# Patient Record
Sex: Female | Born: 1945 | Race: White | Hispanic: No | Marital: Single | Smoking: Current every day smoker
Health system: Southern US, Community
[De-identification: ages and names within clinical notes are randomized; demographics above are authoritative.]

## PROBLEM LIST (undated history)

## (undated) DIAGNOSIS — H269 Unspecified cataract: Secondary | ICD-10-CM

## (undated) DIAGNOSIS — R918 Other nonspecific abnormal finding of lung field: Secondary | ICD-10-CM

## (undated) DIAGNOSIS — IMO0002 Reserved for concepts with insufficient information to code with codable children: Secondary | ICD-10-CM

## (undated) DIAGNOSIS — IMO0001 Reserved for inherently not codable concepts without codable children: Secondary | ICD-10-CM

## (undated) DIAGNOSIS — Z923 Personal history of irradiation: Secondary | ICD-10-CM

## (undated) DIAGNOSIS — D649 Anemia, unspecified: Secondary | ICD-10-CM

## (undated) DIAGNOSIS — E039 Hypothyroidism, unspecified: Secondary | ICD-10-CM

## (undated) DIAGNOSIS — I10 Essential (primary) hypertension: Secondary | ICD-10-CM

## (undated) DIAGNOSIS — Z720 Tobacco use: Secondary | ICD-10-CM

## (undated) DIAGNOSIS — I509 Heart failure, unspecified: Secondary | ICD-10-CM

## (undated) DIAGNOSIS — N289 Disorder of kidney and ureter, unspecified: Secondary | ICD-10-CM

## (undated) DIAGNOSIS — Q254 Congenital malformation of aorta unspecified: Secondary | ICD-10-CM

## (undated) DIAGNOSIS — R011 Cardiac murmur, unspecified: Secondary | ICD-10-CM

## (undated) DIAGNOSIS — E876 Hypokalemia: Secondary | ICD-10-CM

## (undated) DIAGNOSIS — C50919 Malignant neoplasm of unspecified site of unspecified female breast: Secondary | ICD-10-CM

## (undated) DIAGNOSIS — J449 Chronic obstructive pulmonary disease, unspecified: Secondary | ICD-10-CM

## (undated) DIAGNOSIS — C50412 Malignant neoplasm of upper-outer quadrant of left female breast: Secondary | ICD-10-CM

## (undated) DIAGNOSIS — E785 Hyperlipidemia, unspecified: Secondary | ICD-10-CM

## (undated) DIAGNOSIS — I701 Atherosclerosis of renal artery: Secondary | ICD-10-CM

## (undated) DIAGNOSIS — Z8639 Personal history of other endocrine, nutritional and metabolic disease: Secondary | ICD-10-CM

## (undated) DIAGNOSIS — Z972 Presence of dental prosthetic device (complete) (partial): Secondary | ICD-10-CM

## (undated) HISTORY — DX: Essential (primary) hypertension: I10

## (undated) HISTORY — PX: APPENDECTOMY: SHX54

## (undated) HISTORY — DX: Anemia, unspecified: D64.9

## (undated) HISTORY — DX: Reserved for inherently not codable concepts without codable children: IMO0001

## (undated) HISTORY — DX: Congenital malformation of aorta unspecified: Q25.40

## (undated) HISTORY — PX: TONSILLECTOMY: SUR1361

## (undated) HISTORY — DX: Hyperlipidemia, unspecified: E78.5

## (undated) HISTORY — DX: Malignant neoplasm of upper-outer quadrant of left female breast: C50.412

## (undated) HISTORY — PX: ARCH AORTOGRAM: SHX5726

## (undated) HISTORY — DX: Reserved for concepts with insufficient information to code with codable children: IMO0002

## (undated) HISTORY — PX: ABDOMINAL HYSTERECTOMY: SHX81

## (undated) NOTE — *Deleted (*Deleted)
Patient Care Team: Georgianne Fick, MD as PCP - General (Internal Medicine) Runell Gess, MD as Attending Physician (Cardiology) Rachael Fee, MD (Gastroenterology) Richarda Overlie, MD as Consulting Physician (Obstetrics and Gynecology) Salomon Fick, NP as Nurse Practitioner (Hematology and Oncology)  DIAGNOSIS: No diagnosis found.  SUMMARY OF ONCOLOGIC HISTORY: Oncology History  Breast cancer of upper-outer quadrant of left female breast (HCC)  08/30/2015 Imaging   DEXA scan: Normal    09/07/2015 Initial Diagnosis   Screening detected left breast mass at 1:00 position 4 x 3 x 4 mm biopsy grade 1 invasive ductal carcinoma ER 100% PR 90% positive HER-2 negative ratio 1.18 Ki-67 10%, T1 aN0 stage IA clinical stage   09/22/2015 Surgery   Left lumpectomy Derrell Lolling): IDC grade 1, 1.1 minutes, ADH, LCIS, margins negative, 0/3 lymph nodes, ER 100%, PR 90%, HER-2 negative, Ki-67 10%, T1 cN0 stage IA, Oncotype DX score 14, 9% ROR   10/30/2015 - 11/28/2015 Radiation Therapy   Adjuvant XRT ([facility]). Left breast: 42.72 Gy in 16 treatments.  Left breast "boost": 10 Gy in 5 treatments.  Total: 52.72 Gy in 21 treatments    11/2015 -  Anti-estrogen oral therapy   Tamoxifen 20mg  daily. Planned duration of treatment: 5 years ([place])   Squamous cell lung cancer, left (HCC)  09/10/2018 Initial Diagnosis   Squamous cell lung cancer, left (HCC)   09/10/2018 Cancer Staging   Staging form: Lung, AJCC 8th Edition - Clinical stage from 09/10/2018: cT1, cN0, cM0 - Signed by Serena Croissant, MD on 09/10/2018   10/27/2018 - 11/02/2018 Radiation Therapy   Stereotactic radiosurgery   Adenocarcinoma, lung, right (HCC)  09/02/2018 Initial Diagnosis   Lung nodule evaluation of her former smoker with shortness of breath during hospitalization, PET CT, right upper lobe nodule 1.2 x 1.1 cm SUV 7.6 biopsy revealed adenocarcinoma positive for TTF-1 and Napsin A, left upper lobe nodule 2.5 x 2.1 cm SUV  9.8, no lymph node hypermetabolism, 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 and TTF-1 is negative   09/10/2018 Cancer Staging   Staging form: Lung, AJCC 8th Edition - Clinical stage from 09/10/2018: cT1, cN0, cM0 - Signed by Serena Croissant, MD on 09/10/2018   10/27/2018 - 11/02/2018 Radiation Therapy   Stereotactic radiosurgery     CHIEF COMPLIANT: Follow-up of breast and lung cancers  INTERVAL HISTORY: Kendra Watts is a 14 y.o. with above-mentioned history of breast cancer currently on tamoxifen, and bilateral lung cancers. Mammogram on 10/28/19 showed no evidence of malignancy bilaterally. She presents to the clinic today for follow-up.   ALLERGIES:  has No Known Allergies.  MEDICATIONS:  Current Outpatient Medications  Medication Sig Dispense Refill  . acetaminophen (TYLENOL) 325 MG tablet Take 325 mg by mouth every 6 (six) hours as needed for mild pain or moderate pain.    Marland Kitchen albuterol (VENTOLIN HFA) 108 (90 Base) MCG/ACT inhaler Inhale 2 puffs into the lungs every 6 (six) hours as needed for wheezing or shortness of breath. 8 g 3  . aspirin 81 MG tablet Take 1 tablet (81 mg total) by mouth daily.    . Calcium-Magnesium-Vitamin D (CALCIUM 1200+D3 PO) Take 1 tablet by mouth daily.    Marland Kitchen doxazosin (CARDURA) 8 MG tablet Take 8 mg by mouth daily.     . furosemide (LASIX) 20 MG tablet Take 1 tablet (20 mg total) by mouth daily. 30 tablet 0  . ibandronate (BONIVA) 150 MG tablet Take 150 mg by mouth every  30 (thirty) days.    Marland Kitchen levothyroxine (SYNTHROID) 50 MCG tablet Take 50 mcg by mouth daily.    . metoprolol tartrate (LOPRESSOR) 25 MG tablet Take 25 mg by mouth daily.    . Multiple Vitamins-Minerals (MULTIVITAMIN WITH MINERALS) tablet Take 1 tablet by mouth daily.     . nicotine (NICODERM CQ - DOSED IN MG/24 HOURS) 14 mg/24hr patch Place 1 patch (14 mg total) onto the skin daily. (Patient not taking: Reported on 04/08/2020) 28 patch 0  . pantoprazole (PROTONIX) 40 MG  tablet Take 1 tablet (40 mg total) by mouth daily. 30 tablet 0  . rosuvastatin (CRESTOR) 10 MG tablet Take 10 mg by mouth daily.     . tamoxifen (NOLVADEX) 20 MG tablet TAKE 1 TABLET BY MOUTH EVERY DAY 90 tablet 0  . Tiotropium Bromide-Olodaterol (STIOLTO RESPIMAT) 2.5-2.5 MCG/ACT AERS Inhale 2 puffs into the lungs daily. 4 g 12   No current facility-administered medications for this visit.    PHYSICAL EXAMINATION: ECOG PERFORMANCE STATUS: {CHL ONC ECOG [phone]}  There were no vitals filed for this visit. There were no vitals filed for this visit.  BREAST:*** No palpable masses or nodules in either right or left breasts. No palpable axillary supraclavicular or infraclavicular adenopathy no breast tenderness or nipple discharge. (exam performed in the presence of a chaperone)  LABORATORY DATA:  I have reviewed the data as listed CMP Latest Ref Rng & Units 04/12/2020 04/08/2020 03/17/2020  Glucose 70 - 99 mg/dL 409(W) 98 95  BUN 8 - 23 mg/dL 9 13 8   Creatinine 0.44 - 1.00 mg/dL 1.19 1.47 8.29  Sodium 135 - 145 mmol/L 135 137 132(L)  Potassium 3.5 - 5.1 mmol/L 3.7 3.8 4.3  Chloride 98 - 111 mmol/L 100 100 101  CO2 22 - 32 mmol/L 28 26 26   Calcium 8.9 - 10.3 mg/dL 8.3(L) 9.2 8.7(L)  Total Protein 6.5 - 8.1 g/dL - - -  Total Bilirubin 0.3 - 1.2 mg/dL - - -  Alkaline Phos 38 - 126 U/L - - -  AST 15 - 41 U/L - - -  ALT 0 - 44 U/L - - -    Lab Results  Component Value Date   WBC 7.8 04/12/2020   HGB 8.0 (L) 04/12/2020   HCT 26.0 (L) 04/12/2020   MCV 90.9 04/12/2020   PLT 198 04/12/2020   NEUTROABS 10.0 (H) 04/08/2020    ASSESSMENT & PLAN:  No problem-specific Assessment & Plan notes found for this encounter.    No orders of the defined types were placed in this encounter.  The patient has a good understanding of the overall plan. she agrees with it. she will call with any problems that may develop before the next visit here.  Total time spent: *** mins including face  to face time and time spent for planning, charting and coordination of care  Serena Croissant, MD 05/14/2020  I, Kirt Boys Dorshimer, am acting as scribe for Dr. Serena Croissant.  {insert scribe attestation}

---

## 2000-05-28 ENCOUNTER — Ambulatory Visit (HOSPITAL_COMMUNITY): Admission: RE | Admit: 2000-05-28 | Discharge: 2000-05-28 | Payer: Self-pay | Admitting: *Deleted

## 2000-05-28 ENCOUNTER — Encounter (INDEPENDENT_AMBULATORY_CARE_PROVIDER_SITE_OTHER): Payer: Self-pay | Admitting: Specialist

## 2002-06-22 ENCOUNTER — Other Ambulatory Visit: Admission: RE | Admit: 2002-06-22 | Discharge: 2002-06-22 | Payer: Self-pay | Admitting: Obstetrics and Gynecology

## 2003-09-28 ENCOUNTER — Other Ambulatory Visit: Admission: RE | Admit: 2003-09-28 | Discharge: 2003-09-28 | Payer: Self-pay | Admitting: Obstetrics and Gynecology

## 2004-10-26 ENCOUNTER — Other Ambulatory Visit: Admission: RE | Admit: 2004-10-26 | Discharge: 2004-10-26 | Payer: Self-pay | Admitting: Obstetrics and Gynecology

## 2005-05-27 ENCOUNTER — Encounter: Admission: RE | Admit: 2005-05-27 | Discharge: 2005-05-27 | Payer: Self-pay | Admitting: Internal Medicine

## 2006-08-13 ENCOUNTER — Encounter: Admission: RE | Admit: 2006-08-13 | Discharge: 2006-08-13 | Payer: Self-pay | Admitting: Internal Medicine

## 2007-10-09 ENCOUNTER — Encounter: Admission: RE | Admit: 2007-10-09 | Discharge: 2007-10-09 | Payer: Self-pay | Admitting: Orthopedic Surgery

## 2010-06-04 ENCOUNTER — Encounter: Admission: RE | Admit: 2010-06-04 | Discharge: 2010-06-04 | Payer: Self-pay | Admitting: Cardiovascular Disease

## 2010-06-08 ENCOUNTER — Ambulatory Visit (HOSPITAL_COMMUNITY): Admission: RE | Admit: 2010-06-08 | Discharge: 2010-06-09 | Payer: Self-pay | Admitting: Cardiovascular Disease

## 2010-06-08 HISTORY — PX: RENAL ANGIOGRAM: SHX6061

## 2010-10-02 LAB — BASIC METABOLIC PANEL
BUN: 18 mg/dL (ref 6–23)
CO2: 25 mEq/L (ref 19–32)
Calcium: 9.1 mg/dL (ref 8.4–10.5)
Chloride: 106 mEq/L (ref 96–112)
Creatinine, Ser: 1.13 mg/dL (ref 0.4–1.2)
GFR calc Af Amer: 59 mL/min — ABNORMAL LOW (ref >60)
GFR calc non Af Amer: 48 mL/min — ABNORMAL LOW (ref >60)
Glucose, Bld: 98 mg/dL (ref 70–99)
Potassium: 4.1 mEq/L (ref 3.5–5.1)
Sodium: 139 mEq/L (ref 135–145)

## 2010-10-02 LAB — CBC
HCT: 37.4 % (ref 36.0–46.0)
Hemoglobin: 13.1 g/dL (ref 12.0–15.0)
MCH: 31.3 pg (ref 26.0–34.0)
MCHC: 35 g/dL (ref 30.0–36.0)
MCV: 89.3 fL (ref 78.0–100.0)
Platelets: 288 10*3/uL (ref 150–400)
RBC: 4.19 MIL/uL (ref 3.87–5.11)
RDW: 15.8 % — ABNORMAL HIGH (ref 11.5–15.5)
WBC: 10.2 10*3/uL (ref 4.0–10.5)

## 2010-10-17 ENCOUNTER — Encounter (INDEPENDENT_AMBULATORY_CARE_PROVIDER_SITE_OTHER): Payer: Self-pay | Admitting: *Deleted

## 2010-11-21 ENCOUNTER — Ambulatory Visit (INDEPENDENT_AMBULATORY_CARE_PROVIDER_SITE_OTHER): Payer: 59 | Admitting: Gastroenterology

## 2010-11-21 ENCOUNTER — Encounter: Payer: Self-pay | Admitting: Gastroenterology

## 2010-11-21 VITALS — BP 136/64 | HR 68 | Ht 60.0 in | Wt 107.0 lb

## 2010-11-21 DIAGNOSIS — Z1211 Encounter for screening for malignant neoplasm of colon: Secondary | ICD-10-CM

## 2010-11-21 MED ORDER — PEG-KCL-NACL-NASULF-NA ASC-C 100 G PO SOLR: 1.0000 | ORAL | Status: DC

## 2010-11-21 NOTE — Progress Notes (Signed)
HPI: This is a  very pleasant 65 year old woman. She had any colon polyp removed in 2001 by a different gastroenterologist. The biopsies from this showed that it was not an adenomatous polyp.  She is on plavix, prescribed by Dr. Jodi Geralds after renal artery stent was placed this past November.  She has no overt GI symptoms, no bleeding, no constipation or diarrhea. Her weight has been stable.    Review of systems: Pertinent positive and negative review of systems were noted in the above HPI section.  All other review of systems was otherwise negative.   Past Medical History, Past Surgical History, Family History, Social History, Current Medications, Allergies were all reviewed with the patient via [facility] electronic medical record system.   Physical Exam: BP 136/64  Pulse 68  Ht 5' (1.524 m)  Wt 107 lb (48.535 kg)  BMI 20.90 kg/m2  Constitutional: generally well-appearing Psychiatric: alert and oriented x3 Eyes: extraocular movements intact Mouth: oral pharynx moist, no lesions Neck: supple no lymphadenopathy Cardiovascular: heart regular rate and rhythm Lungs: clear to auscultation bilaterally Abdomen: soft, nontender, nondistended, no obvious ascites, no peritoneal signs, normal bowel sounds Extremities: no lower extremity edema bilaterally Skin: no lesions on visible extremities    Assessment and plan: 65 y.o. female with routine risk for colon cancer  She is at increased risk for bleeding complications from a colonoscopy while on Plavix. We will contact her cardiologist about holding the Plavix for 5 days prior to a colonoscopy. I see no reason for any further blood tests or imaging studies prior to then

## 2010-11-21 NOTE — Patient Instructions (Signed)
We will contact Dr. [name] from [facility] about holding your plavix for 5 days prior to a colonoscopy. You will be set up for a colonoscopy.

## 2010-11-22 ENCOUNTER — Encounter: Payer: Self-pay | Admitting: Gastroenterology

## 2010-11-27 ENCOUNTER — Telehealth: Payer: Self-pay

## 2010-11-27 NOTE — Telephone Encounter (Signed)
Pt okd to hold plavix for 5-7 days per Dr Allyson Sabal letter to be scanned to [facility]. Left message on machine to call back

## 2010-11-28 NOTE — Telephone Encounter (Signed)
Pt aware to hold Plavix 5 days before procedure

## 2010-12-04 NOTE — Procedures (Signed)
Kendra Watts, Kendra Watts               ACCOUNT NO.:  [id number]   MEDICAL RECORD NO.:  [id number]          PATIENT TYPE:  OIB   LOCATION:  6524                         FACILITY:  [facility]   PHYSICIAN:  Nanetta Batty, M.D.   DATE OF BIRTH:  26-Mar-1946   DATE OF PROCEDURE:  06/08/2010  DATE OF DISCHARGE:                    PERIPHERAL VASCULAR INVASIVE PROCEDURE    Arch aortogram, selective right-left subclavian artery angiogram,  selective right-left renal artery angiogram, abdominal aortogram with  bifemoral runoff, and PTA stent of left renal artery.   [name] is a 65 year old divorced Caucasian female with no children  who works at [employer].  She is referred by Dr.  Nicholos Johns for cardiovascular evaluation to assess for hypertensive  heart disease, hypertension, and abnormal renal Doppler study performed  in our office.  Risk factors include 40-pack-year history of tobacco  use, smoking 1 pack per day.  She has hypertension, hyperlipidemia, and  a strong family history of heart disease in mother that her first MI at  age 68 and died at 69.  She does get an occasional atypical chest pain  as well as shortness of breath, probably related to her tobacco abuse.  She denies significant claudication.  She has had Dopplers in our office  on April 05, 2010, revealing right renal aortic ratio of 2 with 11  cm pole-to-pole kidney size, left renal aortic ratio of 4.2 with a 9.3  cm pole-to-pole size.  Upper extremity Dopplers revealed high-grade  right subclavian artery stenosis with retrograde right vertebral flow.  She also had disease in her right SFA origin by duplex with preserved  ABIs.  She presents now for angiography, potential intervention for  treatment of renovascular hypertension.   PROCEDURE DESCRIPTION:  The patient was brought to the second floor of  [facility] in the postabsorptive state.  She was  premedicated with p.o. Valium.   Her right groin was prepped and shaved  in usual sterile fashion.  A 1% Xylocaine was used for local anesthesia.  A 5-French sheath was inserted in to the right femoral artery using  standard Seldinger technique.  A 5-French pigtail catheter was used for  arch angiography, abdominal aortography, bifemoral runoff using bolus  chase digital subtraction step table technique.  Visipaque dye was used  for the entirety of the case.  Selective angiography was performed of  the right and left subclavian artery using a long Judkins catheter.  Selective renal angiography was performed with the same catheter.  Visipaque dye was used for the entirety of the case.  Retrograde aortic  pressures were monitored during the case.   ANGIOGRAPHIC RESULTS:  1. Arch aortogram; the patient had arterial dystonia.      a.     80% to 90% right subclavian artery stenosis.  2. Abdominal aortogram.      a.     Renal arteries - 50% right renal artery stenosis with 20-mm       pullback gradient, 90% ostial left renal artery stenosis.      b.     Inferior abdominal aorta; fluoroscopically calcified with  mild-to-moderate atherosclerotic changes.  3. Left lower extremity;      a.     60% left external iliac artery stenosis.      b.     40-60% segmental mid left SFA with 2-vessel runoff.  4. Right lower extremity;      a.     60% right external iliac artery stenosis      b.     50% segmental mid right SFA with 2-vessel runoff.   PROCEDURE DESCRIPTION:  A 5-French sheath was exchanged over wire for 6-  French sheath.  The patient received 3000 units of heparin  intravenously.  Total contrast used was 260 mL to patient.  Using a 6-  [place] short JR-4 guide catheter along with 0.014 x 190 Spartacore wire  and 4.0 x 15 mm Aviator balloon predilatation was performed on the  ostium of the left renal artery.  This did appear to be mildly  calcified.  Following this, stenting was performed with a 5 x 12 Genesis  on  Aviator balloon stent premount at 10 atmospheres, resulting in  reduction of 90% ostial left renal artery stenosis to less than 10%  residual with excellent flow.  No dissection.  The patient tolerated the  procedure well.  Guidewire and catheter removed.  The sheath was sewn  securely in place.  The patient will be hydrated overnight and renal  function test will be checked in the morning.  She received aspirin and  Plavix, discharged home.  If she remains stable, we will get followup  renal Dopplers.  She will see me back in the office exactly after  followup.      Nanetta Batty, M.D.      JB/MEDQ  D:  06/08/2010  T:  06/09/2010  Job:  [id number]   cc:   [facility]  Georgianne Fick, M.D.  [facility] Second Floor   Electronically Signed by Nanetta Batty M.D. on 07/08/2010 02:58:47 PM

## 2010-12-07 NOTE — Procedures (Signed)
[facility]  Patient:    Kendra Watts, Kendra Watts                      MRN: [id number] Proc. Date: 05/28/00 Adm. Date:  [id number] Attending:  Sabino Gasser                           Procedure Report  PROCEDURE:  Colonoscopy.  GASTROENTEROLOGIST:  Sabino Gasser, M.D.  INDICATIONS:  Rectal bleeding.  ANESTHESIA:  Demerol 60 mg, Versed 5 mg.  PROCEDURE IN DETAIL:  With the patient mildly sedated and in the left lateral decubitus position, the Olympus pediatric video colonoscope was inserted in the rectum and then passed under direct vision with pressure applied and patient rotated to various position. Eventually we reached the cecum which was  identified by ileocecal valve and appendiceal orifice, both of which were photographed.  From this point, the colonoscope was slowly withdrawn, taking circumferential views of the entire colonic mucosa, stopping only in the rectum where a small polyp was seen, photographed, and removed using hot biopsy forceps technique.  The rectum otherwise appeared normal on direct view and showed large internal hemorrhoids on retroflexed view.  The patients vital signs and pulse oximetry remained stable.  The patient tolerated the procedure well with no apparent complications.  FINDINGS:  Small polyp in rectal area.  PLAN:  Await biopsy report.  The patient will call me for results and follow up as needed. DD:  05/28/00 TD:  05/28/00 Job: [id number]

## 2010-12-11 ENCOUNTER — Encounter: Payer: Self-pay | Admitting: Gastroenterology

## 2010-12-11 ENCOUNTER — Ambulatory Visit (AMBULATORY_SURGERY_CENTER): Payer: 59 | Admitting: Gastroenterology

## 2010-12-11 VITALS — BP 145/71 | HR 78 | Temp 96.6°F | Resp 17 | Ht 60.0 in | Wt 107.0 lb

## 2010-12-11 DIAGNOSIS — Z1211 Encounter for screening for malignant neoplasm of colon: Secondary | ICD-10-CM

## 2010-12-11 DIAGNOSIS — D126 Benign neoplasm of colon, unspecified: Secondary | ICD-10-CM

## 2010-12-11 MED ORDER — SODIUM CHLORIDE 0.9 % IV SOLN: 500.0000 mL | INTRAVENOUS | Status: DC

## 2010-12-11 NOTE — Patient Instructions (Signed)
Please read the handouts given to you by your recovery room nurse.  You may resume your plavix today per Dr. Christella Hartigan.  You may also resume any routine medications today.  Please call us if you have any questions or concerns.  Thank-you.

## 2010-12-12 ENCOUNTER — Telehealth: Payer: Self-pay | Admitting: *Deleted

## 2010-12-12 NOTE — Telephone Encounter (Signed)

## 2011-01-14 NOTE — Letter (Signed)
Summary: New Patient letter  [facility]  [address] [place], [place] [address]   Phone: [phone]  Fax: [phone]       10/17/2010 MRN: [id number]  Kendra Watts [address] [place], [place]  [address]  [place]  Dear Kendra Watts,  Welcome to the Gastroenterology Division at [facility].    You are scheduled to see Dr.  Christella Hartigan on 11-21-10 at 2:30P.M. on the 3rd floor at [facility], [address]  We ask that you try to arrive at our office 15 minutes prior to your appointment time to allow for check-in.  We would like you to complete the enclosed self-administered evaluation form prior to your visit and bring it with you on the day of your appointment.  We will review it with you.  Also, please bring a complete list of all your medications or, if you prefer, bring the medication bottles and we will list them.  Please bring your insurance card so that we may make a copy of it.  If your insurance requires a referral to see a specialist, please bring your referral form from your primary care physician.  Co-payments are due at the time of your visit and may be paid by cash, check or credit card.     Your office visit will consist of a consult with your physician (includes a physical exam), any laboratory testing he/she may order, scheduling of any necessary diagnostic testing (e.g. x-ray, ultrasound, CT-scan), and scheduling of a procedure (e.g. Endoscopy, Colonoscopy) if required.  Please allow enough time on your schedule to allow for any/all of these possibilities.    If you cannot keep your appointment, please call [phone] to cancel or reschedule prior to your appointment date.  This allows [place] the opportunity to schedule an appointment for another patient in need of care.  If you do not cancel or reschedule by 5 p.m. the business day prior to your appointment date, you will be charged a $50.00 late cancellation/no-show fee.    Thank you for  choosing [facility] for your medical needs.  We appreciate the opportunity to care for you.  Please visit [place] at our website  to learn more about our practice.                     Sincerely,                                                             The Gastroenterology Division

## 2012-01-07 ENCOUNTER — Other Ambulatory Visit: Payer: 59

## 2012-01-07 ENCOUNTER — Encounter: Payer: Self-pay | Admitting: Gastroenterology

## 2012-01-07 ENCOUNTER — Ambulatory Visit (INDEPENDENT_AMBULATORY_CARE_PROVIDER_SITE_OTHER): Payer: 59 | Admitting: Gastroenterology

## 2012-01-07 VITALS — BP 136/58 | HR 88 | Ht 60.0 in | Wt 103.0 lb

## 2012-01-07 DIAGNOSIS — D649 Anemia, unspecified: Secondary | ICD-10-CM

## 2012-01-07 NOTE — Progress Notes (Signed)
Review of pertinent gastrointestinal problems: 1. routine risk for colon cancer. Colonoscopy 2001 was normal. Colonoscopy by Dr. Christella Hartigan 2012, may found a small hyperplastic polyp. Otherwise normal. Recall colonoscopy at 10 year interval.   HPI: This is a  very pleasant 66 year old woman whom I last saw about a year ago.  That was at the time of a colonoscopy for routine risk colon cancer.  She returns today after being told she had significant anemia by her primary care physician. Her hemoglobin was 8.7, her MCV 64.1. Iron level XVI, ferritin 5. He did Hemoccult cards on her in the office and they were negative. She has had no overt bleeding. Just had physical the end of may, found to be anemic. No overt bleeding from GI, vaginal or other.  PUt on iron recently 2 weeks and stools have been darker.    She tended to have loose stools before the iron.  She is not irish heritige, but she has noticed    Review of systems: Pertinent positive and negative review of systems were noted in the above HPI section. Complete review of systems was performed and was otherwise normal.    Past Medical History  Diagnosis Date  . Hypertension   . Hyperlipidemia   . Blockage of kidney vein   . Anemia     Past Surgical History  Procedure Date  . Arch aortogram   . Left subclavian artery angiogram   . Left renal artery angiogram   . Renal artery stent     left  . Appendectomy   . Partial hysterectomy     Current Outpatient Prescriptions  Medication Sig Dispense Refill  . amLODipine-benazepril (LOTREL) 5-20 MG per capsule Take 1 capsule by mouth daily.        Marland Kitchen aspirin 325 MG tablet Take 325 mg by mouth daily.        . clopidogrel (PLAVIX) 75 MG tablet Take 75 mg by mouth daily. PLEASE RESTART THIS PILL TOMORROW      . doxazosin (CARDURA) 8 MG tablet Take 8 mg by mouth every morning.        . Multiple Vitamins-Minerals (MULTIVITAMIN WITH MINERALS) tablet Take 1 tablet by mouth daily.        .  rosuvastatin (CRESTOR) 10 MG tablet Take 10 mg by mouth daily.         Current Facility-Administered Medications  Medication Dose Route Frequency Provider Last Rate Last Dose  . DISCONTD: 0.9 %  sodium chloride infusion  500 mL Intravenous Continuous Rachael Fee, MD        Allergies as of 01/07/2012  . (No Known Allergies)    Family History  Problem Relation Age of Onset  . Heart disease Maternal Grandmother   . Heart disease Mother     History   Social History  . Marital Status: Single    Spouse Name: N/A    Number of Children: N/A  . Years of Education: N/A   Occupational History  . HR administrator    Social History Main Topics  . Smoking status: Current Everyday Smoker -- 1.0 packs/day for 40 years    Types: Cigarettes  . Smokeless tobacco: Never Used  . Alcohol Use: No  . Drug Use: No  . Sexually Active: Not on file   Other Topics Concern  . Not on file   Social History Narrative  . No narrative on file       Physical Exam: BP 136/58  Pulse 88  Ht  5' (1.524 m)  Wt 103 lb (46.72 kg)  BMI 20.12 kg/m2 Constitutional: generally well-appearing Psychiatric: alert and oriented x3 Eyes: extraocular movements intact Mouth: oral pharynx moist, no lesions Neck: supple no lymphadenopathy Cardiovascular: heart regular rate and rhythm Lungs: clear to auscultation bilaterally Abdomen: soft, nontender, nondistended, no obvious ascites, no peritoneal signs, normal bowel sounds Extremities: no lower extremity edema bilaterally Skin: no lesions on visible extremities    Assessment and plan: 66 y.o. female with  significant iron deficiency anemia, was Hemoccult negative by guaiac in primary care office   She is going to complete 2 more sets of Hemoccult tests at home. I am also going to have her down to the lab to check for celiac sprue panel. Further endoscopic testing such as EGD or repeat colonoscopy will be decided on after the results of those  tests.

## 2012-01-07 NOTE — Patient Instructions (Addendum)
One of your biggest health concerns is your smoking.  This increases your risk for most cancers and serious cardiovascular diseases such as strokes, heart attacks.  You should try your best to stop.  If you need assistance, please contact your PCP or Smoking Cessation Class at  [facility] [phone]) or [facility] Quit-Line (1-800-QUIT-NOW). You will have labs checked today in the basement lab.  Please head down after you check out with the front desk  (celiac panel, stool cards for FOBT testing 2 sets).   Pending those results, you will need further endoscopy (egd and or repeat colonoscopy).

## 2012-01-08 LAB — CELIAC PANEL 10
Endomysial Screen: NEGATIVE
Gliadin IgA: 2.4 U/mL (ref <20)
Gliadin IgG: 2.9 U/mL (ref <20)
IgA: 136 mg/dL (ref 69–380)
Tissue Transglut Ab: 4 U/mL (ref <20)
Tissue Transglutaminase Ab, IgA: 2.1 U/mL (ref <20)

## 2012-01-24 ENCOUNTER — Other Ambulatory Visit: Payer: 59

## 2012-01-24 DIAGNOSIS — D649 Anemia, unspecified: Secondary | ICD-10-CM

## 2012-01-24 LAB — HEMOCCULT SLIDES (X 3 CARDS)
Fecal Occult Blood: NEGATIVE
Fecal Occult Blood: NEGATIVE
OCCULT 1: NEGATIVE
OCCULT 1: NEGATIVE
OCCULT 2: NEGATIVE
OCCULT 2: NEGATIVE
OCCULT 3: NEGATIVE
OCCULT 3: NEGATIVE
OCCULT 4: NEGATIVE
OCCULT 4: NEGATIVE
OCCULT 5: NEGATIVE
OCCULT 5: NEGATIVE

## 2012-02-03 ENCOUNTER — Encounter (HOSPITAL_COMMUNITY): Payer: Self-pay | Admitting: Respiratory Therapy

## 2012-02-13 ENCOUNTER — Other Ambulatory Visit: Payer: Self-pay | Admitting: Cardiovascular Disease

## 2012-02-18 ENCOUNTER — Ambulatory Visit (HOSPITAL_COMMUNITY)
Admission: RE | Admit: 2012-02-18 | Discharge: 2012-02-18 | Disposition: A | Discharge status: Home / Self Care | Payer: 59 | Source: Ambulatory Visit | Attending: Cardiovascular Disease | Admitting: Cardiovascular Disease

## 2012-02-18 ENCOUNTER — Encounter (HOSPITAL_COMMUNITY)
Admission: RE | Disposition: A | Discharge status: Home / Self Care | Payer: Self-pay | Source: Ambulatory Visit | Attending: Cardiovascular Disease

## 2012-02-18 DIAGNOSIS — I6529 Occlusion and stenosis of unspecified carotid artery: Secondary | ICD-10-CM | POA: Insufficient documentation

## 2012-02-18 DIAGNOSIS — Y831 Surgical operation with implant of artificial internal device as the cause of abnormal reaction of the patient, or of later complication, without mention of misadventure at the time of the procedure: Secondary | ICD-10-CM | POA: Insufficient documentation

## 2012-02-18 DIAGNOSIS — I708 Atherosclerosis of other arteries: Secondary | ICD-10-CM | POA: Insufficient documentation

## 2012-02-18 DIAGNOSIS — T82898A Other specified complication of vascular prosthetic devices, implants and grafts, initial encounter: Secondary | ICD-10-CM | POA: Insufficient documentation

## 2012-02-18 HISTORY — PX: CAROTID ANGIOGRAM: SHX5504

## 2012-02-18 HISTORY — PX: ABDOMINAL ANGIOGRAM: SHX5499

## 2012-02-18 SURGERY — CAROTID ANGIOGRAM
Anesthesia: LOCAL

## 2012-02-18 MED ORDER — HEPARIN (PORCINE) IN NACL 2-0.9 UNIT/ML-% IJ SOLN
INTRAMUSCULAR | Status: AC
  Filled 2012-02-18: qty 1000

## 2012-02-18 MED ORDER — LIDOCAINE HCL (PF) 1 % IJ SOLN
INTRAMUSCULAR | Status: AC
  Filled 2012-02-18: qty 30

## 2012-02-18 MED ORDER — SODIUM CHLORIDE 0.9 % IJ SOLN: 3.0000 mL | INTRAMUSCULAR | Status: DC | PRN

## 2012-02-18 MED ORDER — SODIUM CHLORIDE 0.9 % IV SOLN
INTRAVENOUS | Status: DC
  Administered 2012-02-18: 08:00:00 via INTRAVENOUS

## 2012-02-18 NOTE — Op Note (Signed)
Kendra Watts is a 66 y.o. female    [id number] LOCATION:  FACILITY: [facility]  PHYSICIAN: Nanetta Batty, M.D. 09/27/1945   DATE OF PROCEDURE:  02/18/2012  DATE OF DISCHARGE:  [facility] Angio    History obtained from chart review. Kendra Watts is a 66 year old thin appearing divorced Caucasian female with no children referred to me by Dr. Nicholos Johns from [facility] medical, who is retired from working at [employer] in [name]. She has a history of continued tobacco abuse one pack per day, hypertension, hyperlipidemia and a strong family history of heart disease with the mother that her first MI at age 83 and died at age 46. She had negative Myoview performed this month. 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 I stented her left renal artery at that time which was calcified. We've been following her carotid Dopplers which showed progression of disease in the right internal carotid artery. She remaines neurologically symptomatic. She presents now for angiography to define the extent of her disease in order to decide appropriateness of therapy.   PROCEDURE DESCRIPTION:    The patient was brought to the second floor  [place] [facility] lab in the postabsorptive state. She was not  premedicated . Her right groin was prepped and shaved in usual sterile fashion. Xylocaine 1% was used  for local anesthesia. A 5 French sheath was inserted into the right common femoral  artery using standard Seldinger technique. A 5 [place] JB 1 catheter along with a short 5 [place] JR catheter and pigtail catheter were used for selective right and left carotid angiography and (intra-and extracranial views), abdominal aortography and selective left renal  artery angiography. Visipaque dye was used for the entirety of the case. Retrograde aortic pressure was monitored in the case.   HEMODYNAMICS:    AO SYSTOLIC/AO DIASTOLIC: 151/59    ANGIOGRAPHIC RESULTS:   1: Right carotid-80% calcified right internal carotid artery stenosis that was a hypodense angiographically with an absent external carotid artery.  2: Left carotid-20% left internal carotid artery stenosis.  3: abdominal aortogram-angiographic calcification of the abdominal aorta at the level of the renal arteries. Selective left renal angiography revealed 60% "in-stent" restenosis within the previous replacement. He was a 50% left common iliac artery stenosis as well.  IMPRESSION:Kendra Watts has a 75-80% calcified hypodense proximal right internal carotid artery stenosis with velocities that are consistent with her anatomy. She was neurologically asymptomatic and low risk for endarterectomy. I have reviewed her anatomy with Dr. Durene Cal , from [facility], who feels that she is an appropriate endarterectomy candidate.  The sheath was removed and pressure was held on the groin to achieve hemostasis. The patient left the lab in stable condition. She'll be hydrated for next 4 hours or remain recumbent at which time she'll be discharged home. It was her back in the office after that for further evaluation. Interpretation of intracranial cerebral entry and will be performed by neuro interventional radiology.  Runell Gess MD, [facility] 02/18/2012 9:55 AM

## 2012-02-18 NOTE — H&P (Signed)
  H & P will be scanned in.  Pt was reexamined and existing H & P reviewed. No changes found.  Runell Gess, MD [facility] 02/18/2012 9:09 AM

## 2012-02-20 NOTE — Consult Note (Signed)
NAMEKASIDY, GIANINO               ACCOUNT NO.:  [id number]  MEDICAL RECORD NO.:  [id number]  LOCATION:  [facility]                         FACILITY:  [facility]  PHYSICIAN:  Semiah Konczal K. Sallye Lunz, M.D.DATE OF BIRTH:  1946-05-14  DATE OF CONSULTATION: DATE OF DISCHARGE:  02/18/2012                                CONSULTATION   CLINICAL HISTORY:  The patient with symptoms of left carotid bruit.  EXAMINATION:  Intracranial interpretation of bilateral common carotid arteriograms.  FINDINGS:    The right common carotid artery arteriogram demonstrates the distal vertical cervical portion of the right internal carotid artery to be normal.  There is approximately 50-60% focal stenosis of the proximal cavernous segment of the right internal carotid artery secondary to a circumferential plaque.  The petrous segment is normal.  The supraclinoid segment also opacifies normally.  The right middle cerebral artery and the right anterior cerebral artery opacify normally into the capillary and the venous phases.  The left common carotid arteriogram demonstrates moderate tortuosity of the distal cervical segment of the left internal carotid artery.  The cervical petrous junction is normal.  There is a focal moderate stenosis of the petrous cavernous junction.  The distal cavernous and the supraclinoid segments are normal.  The left middle cerebral artery has a mild focal stenosis at its origin. It's branches are otherwise normally opacified into the capillary and venous phases.  The left anterior cerebral artery  Opacifies  into the A2 and distal distribution, with cross opacification via the anterior communicating artery of the right anterior cerebral artery distal to the A2 segment.  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  IMPRESSION: 1. Focal probably 50-60% stenosis of the right internal carotid     artery, proximal cavernous segment. 2.  Moderate stenosis of the petrous cavernous junction of the left     internal carotid artery. 3. Mild stenosis at the origin of the left middle cerebral artery.          ______________________________ Grandville Silos Corliss Skains, M.D.     SKD/MEDQ  D:  02/19/2012  T:  02/20/2012  Job:  [id number]

## 2012-02-21 ENCOUNTER — Telehealth: Payer: Self-pay | Admitting: Surgery

## 2012-02-21 NOTE — Telephone Encounter (Signed)
Message copied by Fredrich Birks on Fri Feb 21, 2012 11:53 AM ------      Message from: Melene Plan      Created: Fri Feb 21, 2012  9:15 AM                   ----- Message -----         From: Nada Libman, MD         Sent: 02/21/2012   7:22 AM           To: Melene Plan, RN            This patient needs a clinic appt with me.  She is a patient of Dr. Allyson Sabal.  He has already taken care of it

## 2012-02-21 NOTE — Telephone Encounter (Signed)
Patient notified of appointment and new pw sent, dpm

## 2012-02-25 ENCOUNTER — Ambulatory Visit (INDEPENDENT_AMBULATORY_CARE_PROVIDER_SITE_OTHER): Payer: 59 | Admitting: Gastroenterology

## 2012-02-25 ENCOUNTER — Encounter: Payer: Self-pay | Admitting: Gastroenterology

## 2012-02-25 ENCOUNTER — Ambulatory Visit (INDEPENDENT_AMBULATORY_CARE_PROVIDER_SITE_OTHER)
Admission: RE | Admit: 2012-02-25 | Discharge: 2012-02-25 | Disposition: A | Discharge status: Home / Self Care | Payer: 59 | Source: Ambulatory Visit | Attending: Gastroenterology | Admitting: Gastroenterology

## 2012-02-25 ENCOUNTER — Other Ambulatory Visit (INDEPENDENT_AMBULATORY_CARE_PROVIDER_SITE_OTHER): Payer: 59

## 2012-02-25 VITALS — BP 116/56 | HR 84 | Ht 59.5 in | Wt 98.4 lb

## 2012-02-25 DIAGNOSIS — R634 Abnormal weight loss: Secondary | ICD-10-CM

## 2012-02-25 DIAGNOSIS — D649 Anemia, unspecified: Secondary | ICD-10-CM

## 2012-02-25 LAB — CREATININE, SERUM: Creatinine, Ser: 1.1 mg/dL (ref 0.4–1.2)

## 2012-02-25 LAB — BUN: BUN: 14 mg/dL (ref 6–23)

## 2012-02-25 NOTE — Patient Instructions (Addendum)
One of your biggest health concerns is your smoking.  This increases your risk for most cancers and serious cardiovascular diseases such as strokes, heart attacks.  You should try your best to stop.  If you need assistance, please contact your PCP or Smoking Cessation Class at [facility] [phone]) or [facility] Quit-Line (1-800-QUIT-NOW). You will be set up for a CT scan of abdomen and pelvis with IV and oral contrast for anemia, weight loss. Chest xray (PA and lateral) for anemia and weight loss, smoker.  Head down to the basement at our x ray department for chest xray. Please have labs today as well as the chest xray at our basement level.   You have been scheduled for a CT scan of the abdomen and pelvis at [facility] ([address]---this is in the same building as Architectural technologist).   You are scheduled on 02/27/12 at 9 am. You should arrive 15 minutes prior to your appointment time for registration. Please follow the written instructions below on the day of your exam:  WARNING: IF YOU ARE ALLERGIC TO IODINE/X-RAY DYE, PLEASE NOTIFY RADIOLOGY IMMEDIATELY AT [phone]! YOU WILL BE GIVEN A 13 HOUR PREMEDICATION PREP.  1) Do not eat or drink anything after 5 am (4 hours prior to your test) 2) You have been given 2 bottles of oral contrast to drink. The solution may taste  better if refrigerated, but do NOT add ice or any other liquid to this solution. Shake well before drinking.    Drink 1 bottle of contrast @ 7 am (2 hours prior to your exam)  Drink 1 bottle of contrast @ 8 am  (1 hour prior to your exam)  You may take any medications as prescribed with a small amount of water except for the following: Metformin, Glucophage, Glucovance, Avandamet, Riomet, Fortamet, Actoplus Met, Janumet, Glumetza or Metaglip. The above medications must be held the day of the exam AND 48 hours after the exam.  The purpose of you drinking the oral contrast is to aid in the visualization  of your intestinal tract. The contrast solution may cause some diarrhea. Before your exam is started, you will be given a small amount of fluid to drink. Depending on your individual set of symptoms, you may also receive an intravenous injection of x-ray contrast/dye. Plan on being at [facility] for 30 minutes or long, depending on the type of exam you are having performed.  If you have any questions regarding your exam or if you need to reschedule, you may call the CT department at [phone] between the hours of 8:00 am and 5:00 pm, Monday-Friday.  ________________________________________________________________________

## 2012-02-25 NOTE — Progress Notes (Signed)
Review of pertinent gastrointestinal problems:  1. routine risk for colon cancer. Colonoscopy 2001 was normal. Colonoscopy by Dr. Christella Hartigan 2012, may found a small hyperplastic polyp. Otherwise normal. Recall colonoscopy at 10 year interval. 2. IDA: June 2013 persistently Hemoccult negative, celiac sprue workup was negative by blood test. Her hemoglobin was 8.7, her MCV 64.1. Iron level 16, ferritin 5.  HPI: This is a  very pleasant 66 year old woman whom I last saw 1-2 months ago.  She thinks she's lost some weight, "may be from not working."  She feels maybe 5-6 pounds in past few months.  No anorexia.  No dyspepsia, no early satiety.  Has been on iron for over two months.  Getting labs rechecked this Friday.   Past Medical History  Diagnosis Date  . Hypertension   . Hyperlipidemia   . Blockage of kidney vein   . Anemia     Past Surgical History  Procedure Date  . Arch aortogram   . Left subclavian artery angiogram   . Left renal artery angiogram   . Renal artery stent     left  . Appendectomy   . Partial hysterectomy     Current Outpatient Prescriptions  Medication Sig Dispense Refill  . amLODipine-benazepril (LOTREL) 5-20 MG per capsule Take 1 capsule by mouth daily.        Marland Kitchen aspirin 325 MG tablet Take 325 mg by mouth daily.        . clopidogrel (PLAVIX) 75 MG tablet Take 75 mg by mouth daily.       Marland Kitchen doxazosin (CARDURA) 8 MG tablet Take 8 mg by mouth every morning.        . Fe Fum-FePoly-FA-Vit C-Vit B3 (INTEGRA F PO) Take 1 capsule by mouth daily.      . Multiple Vitamins-Minerals (MULTIVITAMIN WITH MINERALS) tablet Take 1 tablet by mouth daily.        . rosuvastatin (CRESTOR) 10 MG tablet Take 10 mg by mouth daily.          Allergies as of 02/25/2012  . (No Known Allergies)    Family History  Problem Relation Age of Onset  . Heart disease Maternal Grandmother   . Heart disease Mother     History   Social History  . Marital Status: Single    Spouse Name: N/A     Number of Children: N/A  . Years of Education: N/A   Occupational History  . HR administrator    Social History Main Topics  . Smoking status: Current Everyday Smoker -- 1.0 packs/day for 40 years    Types: Cigarettes  . Smokeless tobacco: Never Used  . Alcohol Use: No  . Drug Use: No  . Sexually Active: Not on file   Other Topics Concern  . Not on file   Social History Narrative  . No narrative on file      Physical Exam: BP 116/56  Pulse 84  Ht 4' 11.5" (1.511 m)  Wt 98 lb 6 oz (44.623 kg)  BMI 19.54 kg/m2 Constitutional: generally well-appearing Psychiatric: alert and oriented x3 Abdomen: soft, nontender, nondistended, no obvious ascites, no peritoneal signs, normal bowel sounds     Assessment and plan: 66 y.o. female with iron deficiency anemia, persistently Hemoccult negative  She has no GI symptoms, she is persistently Hemoccult negative, celiac sprue testing was normal, colonoscopy about a year ago found only hyperplastic polyps. I don't think her anemia is from her GI tract. She has however had significant iron deficiency  anemia and some weight loss. She is a chronic smoker. Going to image her for occult neoplasm using CT scan abdomen and pelvis as well as PA and lateral chest x-ray.

## 2012-02-27 ENCOUNTER — Ambulatory Visit (INDEPENDENT_AMBULATORY_CARE_PROVIDER_SITE_OTHER)
Admission: RE | Admit: 2012-02-27 | Discharge: 2012-02-27 | Disposition: A | Discharge status: Home / Self Care | Payer: 59 | Source: Ambulatory Visit | Attending: Gastroenterology | Admitting: Gastroenterology

## 2012-02-27 DIAGNOSIS — R634 Abnormal weight loss: Secondary | ICD-10-CM

## 2012-02-27 DIAGNOSIS — D649 Anemia, unspecified: Secondary | ICD-10-CM

## 2012-02-27 MED ORDER — IOHEXOL 300 MG/ML  SOLN
100.0000 mL | Freq: Once | INTRAMUSCULAR | Status: AC | PRN
  Administered 2012-02-27: 100 mL via INTRAVENOUS

## 2012-03-03 ENCOUNTER — Other Ambulatory Visit: Payer: Self-pay

## 2012-03-03 DIAGNOSIS — R932 Abnormal findings on diagnostic imaging of liver and biliary tract: Secondary | ICD-10-CM

## 2012-03-03 NOTE — Progress Notes (Signed)
[facility] MRI pancreas 03/11/12 8 am 745 am arrival NPO 4 hours. Pt has been notified

## 2012-03-06 ENCOUNTER — Encounter: Payer: Self-pay | Admitting: Surgery

## 2012-03-09 ENCOUNTER — Other Ambulatory Visit: Payer: Self-pay | Admitting: *Deleted

## 2012-03-09 ENCOUNTER — Ambulatory Visit (INDEPENDENT_AMBULATORY_CARE_PROVIDER_SITE_OTHER): Payer: 59 | Admitting: Surgery

## 2012-03-09 ENCOUNTER — Encounter: Payer: Self-pay | Admitting: Surgery

## 2012-03-09 ENCOUNTER — Encounter: Payer: Self-pay | Admitting: *Deleted

## 2012-03-09 VITALS — BP 152/64 | HR 88 | Resp 18 | Ht 60.0 in | Wt 99.3 lb

## 2012-03-09 DIAGNOSIS — I6529 Occlusion and stenosis of unspecified carotid artery: Secondary | ICD-10-CM

## 2012-03-09 NOTE — Progress Notes (Signed)
[facility] [place]   Patient name: Kendra Watts MRN: [id number] DOB: Aug 31, 1945 Sex: female   Referred by: Dr. Allyson Sabal  Reason for referral:  Chief Complaint  Patient presents with  . New Evaluation    referredby Dr. Allyson Sabal  . Carotid    HISTORY OF PRESENT ILLNESS: The patient comes in today for discussions regarding right carotid endarterectomy. 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 This is been followed with serial ultrasound to where it had progressed to greater than 70%. She recently underwent diagnostic angiography which revealed 80% right carotid stenosis. She was referred for carotid endarterectomy.  The patient is medically managed for hypertension and hypercholesterolemia. She has a strong family history of heart disease. Her mother first had a myocardial infarction at age 85 and died at age 50. She has recently had a negative Myoview on 02/07/2012.  The patient remained asymptomatic. She denies a history of numbness or weakness in either extremity. She denies slurred speech. She denies amaurosis fugax.  Past Medical History  Diagnosis Date  . Hypertension   . Hyperlipidemia   . Blockage of kidney vein   . Anemia     Past Surgical History  Procedure Date  . Arch aortogram   . Left subclavian artery angiogram   . Left renal artery angiogram   . Renal artery stent     left  . Appendectomy   . Partial hysterectomy     History   Social History  . Marital Status: Single    Spouse Name: N/A    Number of Children: N/A  . Years of Education: N/A   Occupational History  . HR administrator    Social History Main Topics  . Smoking status: Current Everyday Smoker -- 1.0 packs/day for 40 years    Types: Cigarettes  . Smokeless tobacco: Never Used   Comment: has a prescription for Chantix and states she will get it filled and has a desire to stop smoking  . Alcohol Use: Yes   occassional  . Drug Use: No  . Sexually Active: Not on file   Other Topics Concern  . Not on file   Social History Narrative  . No narrative on file    Family History  Problem Relation Age of Onset  . Heart disease Maternal Grandmother   . Heart disease Mother     Allergies as of 03/09/2012  . (No Known Allergies)    Current Outpatient Prescriptions on File Prior to Visit  Medication Sig Dispense Refill  . amLODipine-benazepril (LOTREL) 5-20 MG per capsule Take 1 capsule by mouth daily.        Marland Kitchen aspirin 325 MG tablet Take 325 mg by mouth daily.        . clopidogrel (PLAVIX) 75 MG tablet Take 75 mg by mouth daily.       Marland Kitchen doxazosin (CARDURA) 8 MG tablet Take 8 mg by mouth every morning.        . Fe Fum-FePoly-FA-Vit C-Vit B3 (INTEGRA F PO) Take 1 capsule by mouth daily.      . Multiple Vitamins-Minerals (MULTIVITAMIN WITH MINERALS) tablet Take 1 tablet by mouth daily.        . rosuvastatin (CRESTOR) 10 MG tablet Take 10 mg by mouth daily.           REVIEW OF SYSTEMS: Cardiovascular: No chest pain, chest pressure, palpitations, orthopnea, or dyspnea on exertion.  Pulmonary: No productive cough, asthma  or wheezing. Neurologic: No weakness, paresthesias, aphasia, or amaurosis. No dizziness. Hematologic: No bleeding problems or clotting disorders. Musculoskeletal: No joint pain or joint swelling. Gastrointestinal: No blood in stool or hematemesis Genitourinary: No dysuria or hematuria. Psychiatric:: No history of major depression. Integumentary: No rashes or ulcers. Constitutional: No fever or chills.  PHYSICAL EXAMINATION: General: The patient appears their stated age.  Vital signs are BP 152/64  Pulse 88  Resp 18  Ht 5' (1.524 m)  Wt 99 lb 4.8 oz (45.042 kg)  BMI 19.39 kg/m2 HEENT:  No gross abnormalities Pulmonary: Respirations are non-labored Abdomen: Soft and non-tender  Musculoskeletal: There are no major deformities.   Neurologic: No focal weakness or  paresthesias are detected, Skin: There are no ulcer or rashes noted. Psychiatric: The patient has normal affect. Cardiovascular: There is a regular rate and rhythm without significant murmur appreciated. Bilateral carotid bruit.  Diagnostic Studies: I have reviewed her angiogram which reveals 80% right carotid stenosis. She has 20% left carotid stenosis  Outside Studies/Documentation Historical records were reviewed.  They showed high-grade right carotid stenosis, asymptomatic  Medication Changes: The patient will stop her Plavix 5 days prior to her operation  Assessment:  Asymptomatic right carotid stenosis Plan: I discussed the pathophysiology of carotid occlusive disease and the potential complications associated with each treatment modality. We have decided to proceed with right carotid endarterectomy on Thursday, July 12. The risks and benefits were discussed with the patient including the risk of stroke, the risk of nerve injury, cardiopulmonary complications, infection, and bleeding. All of her questions were answered. She does live alone and therefore potentially might require a 2 day hospital stay. I will stop her Plavix 5 days prior to her operation.     Jorge Ny, M.D. [facility] [place] Office: [phone] Pager:  [phone]

## 2012-03-11 ENCOUNTER — Ambulatory Visit (HOSPITAL_COMMUNITY)
Admission: RE | Admit: 2012-03-11 | Discharge: 2012-03-11 | Disposition: A | Discharge status: Home / Self Care | Payer: 59 | Source: Ambulatory Visit | Attending: Gastroenterology | Admitting: Gastroenterology

## 2012-03-11 DIAGNOSIS — R932 Abnormal findings on diagnostic imaging of liver and biliary tract: Secondary | ICD-10-CM

## 2012-03-11 DIAGNOSIS — N281 Cyst of kidney, acquired: Secondary | ICD-10-CM | POA: Insufficient documentation

## 2012-03-11 MED ORDER — GADOBENATE DIMEGLUMINE 529 MG/ML IV SOLN
9.0000 mL | Freq: Once | INTRAVENOUS | Status: AC | PRN
  Administered 2012-03-11: 9 mL via INTRAVENOUS

## 2012-03-19 ENCOUNTER — Encounter (HOSPITAL_COMMUNITY): Payer: Self-pay | Admitting: Pharmacy Technician

## 2012-03-27 ENCOUNTER — Encounter (HOSPITAL_COMMUNITY): Payer: Self-pay

## 2012-03-27 ENCOUNTER — Encounter (HOSPITAL_COMMUNITY)
Admission: RE | Admit: 2012-03-27 | Discharge: 2012-03-27 | Disposition: A | Discharge status: Home / Self Care | Payer: 59 | Source: Ambulatory Visit | Attending: Surgery | Admitting: Surgery

## 2012-03-27 HISTORY — DX: Cardiac murmur, unspecified: R01.1

## 2012-03-27 LAB — COMPREHENSIVE METABOLIC PANEL
ALT: 19 U/L (ref 0–35)
AST: 22 U/L (ref 0–37)
Albumin: 4.1 g/dL (ref 3.5–5.2)
Alkaline Phosphatase: 86 U/L (ref 39–117)
BUN: 14 mg/dL (ref 6–23)
CO2: 27 mEq/L (ref 19–32)
Calcium: 10.4 mg/dL (ref 8.4–10.5)
Chloride: 101 mEq/L (ref 96–112)
Creatinine, Ser: 0.89 mg/dL (ref 0.50–1.10)
GFR calc Af Amer: 77 mL/min — ABNORMAL LOW (ref >90)
GFR calc non Af Amer: 67 mL/min — ABNORMAL LOW (ref >90)
Glucose, Bld: 86 mg/dL (ref 70–99)
Potassium: 4.2 mEq/L (ref 3.5–5.1)
Sodium: 140 mEq/L (ref 135–145)
Total Bilirubin: 0.2 mg/dL — ABNORMAL LOW (ref 0.3–1.2)
Total Protein: 7 g/dL (ref 6.0–8.3)

## 2012-03-27 LAB — URINALYSIS, ROUTINE W REFLEX MICROSCOPIC
Bilirubin Urine: NEGATIVE
Glucose, UA: NEGATIVE mg/dL
Hgb urine dipstick: NEGATIVE
Ketones, ur: 15 mg/dL — AB
Nitrite: NEGATIVE
Protein, ur: NEGATIVE mg/dL
Specific Gravity, Urine: 1.024 (ref 1.005–1.030)
Urobilinogen, UA: 1 mg/dL (ref 0.0–1.0)
pH: 5.5 (ref 5.0–8.0)

## 2012-03-27 LAB — SURGICAL PCR SCREEN
MRSA, PCR: NEGATIVE
Staphylococcus aureus: NEGATIVE

## 2012-03-27 LAB — CBC
HCT: 39.8 % (ref 36.0–46.0)
Hemoglobin: 13.4 g/dL (ref 12.0–15.0)
MCH: 29.1 pg (ref 26.0–34.0)
MCHC: 33.7 g/dL (ref 30.0–36.0)
MCV: 86.5 fL (ref 78.0–100.0)
Platelets: 265 10*3/uL (ref 150–400)
RBC: 4.6 MIL/uL (ref 3.87–5.11)
RDW: 20.5 % — ABNORMAL HIGH (ref 11.5–15.5)
WBC: 8.9 10*3/uL (ref 4.0–10.5)

## 2012-03-27 LAB — ABO/RH: ABO/RH(D): O NEG

## 2012-03-27 LAB — TYPE AND SCREEN
ABO/RH(D): O NEG
Antibody Screen: NEGATIVE

## 2012-03-27 LAB — URINE MICROSCOPIC-ADD ON

## 2012-03-27 LAB — PROTIME-INR
INR: 1.06 (ref 0.00–1.49)
Prothrombin Time: 14 seconds (ref 11.6–15.2)

## 2012-03-27 LAB — APTT: aPTT: 30 seconds (ref 24–37)

## 2012-03-27 NOTE — Progress Notes (Addendum)
cxr 8/13, neg stress 7/13 per dr Myra Gianotti note ekg req;d from [facility]

## 2012-03-27 NOTE — Pre-Procedure Instructions (Addendum)
[address] [facility]  03/27/2012   Your procedure is scheduled on:  04/02/12  Report to [facility] at 530 AM.  Call this number if you have problems the morning of surgery: [phone]   Remember:   Do not eat food or drink:After Midnight.    Take these medicines the morning of surgery with A SIP OF WATER:amlodipine (lotrel),cardura,aspirin  STOP plavix   Do not wear jewelry, make-up or nail polish.  Do not wear lotions, powders, or perfumes. You may wear deodorant.  Do not shave 48 hours prior to surgery. Men may shave face and neck.  Do not bring valuables to the hospital.  Contacts, dentures or bridgework may not be worn into surgery.  Leave suitcase in the car. After surgery it may be brought to your room.  For patients admitted to the hospital, checkout time is 11:00 AM the day of discharge.   Patients discharged the day of surgery will not be allowed to drive home.  Name and phone number of your driver: laura mallory  neighbor  Special Instructions: CHG Shower Use Special Wash: 1/2 bottle night before surgery and 1/2 bottle morning of surgery.   Please read over the following fact sheets that you were given: Pain Booklet, Coughing and Deep Breathing, Blood Transfusion Information, MRSA Information and Surgical Site Infection Prevention

## 2012-04-01 MED ORDER — DEXTROSE 5 % IV SOLN
1.5000 g | INTRAVENOUS | Status: AC
  Administered 2012-04-02: 1.5 g via INTRAVENOUS
  Filled 2012-04-01: qty 1.5

## 2012-04-02 ENCOUNTER — Telehealth: Payer: Self-pay | Admitting: Surgery

## 2012-04-02 ENCOUNTER — Encounter (HOSPITAL_COMMUNITY): Payer: Self-pay | Admitting: Certified Registered"

## 2012-04-02 ENCOUNTER — Inpatient Hospital Stay (HOSPITAL_COMMUNITY)
Admission: RE | Admit: 2012-04-02 | Discharge: 2012-04-04 | DRG: 039 | Disposition: A | Discharge status: Home / Self Care | Payer: 59 | Source: Ambulatory Visit | Attending: Surgery | Admitting: Surgery

## 2012-04-02 ENCOUNTER — Ambulatory Visit (HOSPITAL_COMMUNITY): Payer: 59 | Admitting: Certified Registered"

## 2012-04-02 ENCOUNTER — Encounter (HOSPITAL_COMMUNITY)
Admission: RE | Disposition: A | Discharge status: Home / Self Care | Payer: Self-pay | Source: Ambulatory Visit | Attending: Surgery

## 2012-04-02 ENCOUNTER — Encounter (HOSPITAL_COMMUNITY): Payer: Self-pay | Admitting: *Deleted

## 2012-04-02 DIAGNOSIS — I6529 Occlusion and stenosis of unspecified carotid artery: Principal | ICD-10-CM | POA: Diagnosis present

## 2012-04-02 DIAGNOSIS — F17200 Nicotine dependence, unspecified, uncomplicated: Secondary | ICD-10-CM | POA: Diagnosis present

## 2012-04-02 DIAGNOSIS — E7800 Pure hypercholesterolemia, unspecified: Secondary | ICD-10-CM | POA: Diagnosis present

## 2012-04-02 DIAGNOSIS — I10 Essential (primary) hypertension: Secondary | ICD-10-CM | POA: Diagnosis present

## 2012-04-02 DIAGNOSIS — E785 Hyperlipidemia, unspecified: Secondary | ICD-10-CM | POA: Diagnosis present

## 2012-04-02 DIAGNOSIS — Z7982 Long term (current) use of aspirin: Secondary | ICD-10-CM

## 2012-04-02 DIAGNOSIS — Z8249 Family history of ischemic heart disease and other diseases of the circulatory system: Secondary | ICD-10-CM

## 2012-04-02 HISTORY — PX: ENDARTERECTOMY: SHX5162

## 2012-04-02 SURGERY — ENDARTERECTOMY, CAROTID
Anesthesia: General | Site: Neck | Laterality: Right | Wound class: Clean

## 2012-04-02 MED ORDER — GUAIFENESIN-DM 100-10 MG/5ML PO SYRP: 15.0000 mL | ORAL_SOLUTION | ORAL | Status: DC | PRN

## 2012-04-02 MED ORDER — AMLODIPINE BESYLATE 5 MG PO TABS
5.0000 mg | ORAL_TABLET | Freq: Every day | ORAL | Status: DC
  Administered 2012-04-03 – 2012-04-04 (×2): 5 mg via ORAL
  Filled 2012-04-02 (×2): qty 1

## 2012-04-02 MED ORDER — MORPHINE SULFATE 2 MG/ML IJ SOLN: 2.0000 mg | INTRAMUSCULAR | Status: DC | PRN

## 2012-04-02 MED ORDER — ACETAMINOPHEN 325 MG PO TABS: 325.0000 mg | ORAL_TABLET | ORAL | Status: DC | PRN

## 2012-04-02 MED ORDER — MIDAZOLAM HCL 2 MG/2ML IJ SOLN: 0.5000 mg | Freq: Once | INTRAMUSCULAR | Status: DC | PRN

## 2012-04-02 MED ORDER — PROMETHAZINE HCL 25 MG/ML IJ SOLN: 6.2500 mg | INTRAMUSCULAR | Status: DC | PRN

## 2012-04-02 MED ORDER — KCL IN DEXTROSE-NACL 20-5-0.45 MEQ/L-%-% IV SOLN
INTRAVENOUS | Status: DC
  Administered 2012-04-02: 18:00:00 via INTRAVENOUS
  Filled 2012-04-02 (×2): qty 1000

## 2012-04-02 MED ORDER — EPHEDRINE SULFATE 50 MG/ML IJ SOLN
INTRAMUSCULAR | Status: DC | PRN
  Administered 2012-04-02: 10 mg via INTRAVENOUS
  Administered 2012-04-02: 5 mg via INTRAVENOUS
  Administered 2012-04-02: 10 mg via INTRAVENOUS
  Administered 2012-04-02: 5 mg via INTRAVENOUS

## 2012-04-02 MED ORDER — ATORVASTATIN CALCIUM 10 MG PO TABS
10.0000 mg | ORAL_TABLET | Freq: Every day | ORAL | Status: DC
  Administered 2012-04-02 – 2012-04-03 (×2): 10 mg via ORAL
  Filled 2012-04-02 (×3): qty 1

## 2012-04-02 MED ORDER — OXYCODONE HCL 5 MG PO TABS: 5.0000 mg | ORAL_TABLET | Freq: Four times a day (QID) | ORAL | Status: AC | PRN

## 2012-04-02 MED ORDER — FENTANYL CITRATE 0.05 MG/ML IJ SOLN
25.0000 ug | INTRAMUSCULAR | Status: DC | PRN
  Administered 2012-04-02: 25 ug via INTRAVENOUS

## 2012-04-02 MED ORDER — PROTAMINE SULFATE 10 MG/ML IV SOLN
INTRAVENOUS | Status: DC | PRN
  Administered 2012-04-02: 10 mg via INTRAVENOUS
  Administered 2012-04-02: 30 mg via INTRAVENOUS
  Administered 2012-04-02: 10 mg via INTRAVENOUS

## 2012-04-02 MED ORDER — 0.9 % SODIUM CHLORIDE (POUR BTL) OPTIME
TOPICAL | Status: DC | PRN
  Administered 2012-04-02 (×2): 1000 mL

## 2012-04-02 MED ORDER — OXYCODONE HCL 5 MG PO TABS
5.0000 mg | ORAL_TABLET | ORAL | Status: DC | PRN
  Administered 2012-04-02: 5 mg via ORAL
  Filled 2012-04-02: qty 1

## 2012-04-02 MED ORDER — ONDANSETRON HCL 4 MG/2ML IJ SOLN: 4.0000 mg | Freq: Four times a day (QID) | INTRAMUSCULAR | Status: DC | PRN

## 2012-04-02 MED ORDER — PHENOL 1.4 % MT LIQD: 1.0000 | OROMUCOSAL | Status: DC | PRN

## 2012-04-02 MED ORDER — ASPIRIN 325 MG PO TABS
325.0000 mg | ORAL_TABLET | Freq: Every day | ORAL | Status: DC
  Administered 2012-04-03 – 2012-04-04 (×2): 325 mg via ORAL
  Filled 2012-04-02 (×2): qty 1

## 2012-04-02 MED ORDER — METOPROLOL TARTRATE 1 MG/ML IV SOLN: 2.0000 mg | INTRAVENOUS | Status: DC | PRN

## 2012-04-02 MED ORDER — DOCUSATE SODIUM 100 MG PO CAPS
100.0000 mg | ORAL_CAPSULE | Freq: Every day | ORAL | Status: DC
  Administered 2012-04-03 – 2012-04-04 (×2): 100 mg via ORAL
  Filled 2012-04-02 (×2): qty 1

## 2012-04-02 MED ORDER — LIDOCAINE HCL (CARDIAC) 20 MG/ML IV SOLN
INTRAVENOUS | Status: DC | PRN
  Administered 2012-04-02: 60 mg via INTRAVENOUS

## 2012-04-02 MED ORDER — PROPOFOL 10 MG/ML IV BOLUS
INTRAVENOUS | Status: DC | PRN
  Administered 2012-04-02: 20 mg via INTRAVENOUS
  Administered 2012-04-02: 30 mg via INTRAVENOUS
  Administered 2012-04-02: 80 mg via INTRAVENOUS

## 2012-04-02 MED ORDER — ONDANSETRON HCL 4 MG/2ML IJ SOLN
INTRAMUSCULAR | Status: DC | PRN
  Administered 2012-04-02: 4 mg via INTRAVENOUS

## 2012-04-02 MED ORDER — LIDOCAINE HCL (PF) 1 % IJ SOLN
INTRAMUSCULAR | Status: AC
  Filled 2012-04-02: qty 30

## 2012-04-02 MED ORDER — BENAZEPRIL HCL 20 MG PO TABS
20.0000 mg | ORAL_TABLET | Freq: Every day | ORAL | Status: DC
  Administered 2012-04-03 – 2012-04-04 (×2): 20 mg via ORAL
  Filled 2012-04-02 (×2): qty 1

## 2012-04-02 MED ORDER — HYDRALAZINE HCL 20 MG/ML IJ SOLN: 10.0000 mg | INTRAMUSCULAR | Status: DC | PRN

## 2012-04-02 MED ORDER — PHENYLEPHRINE HCL 10 MG/ML IJ SOLN
10.0000 mg | INTRAVENOUS | Status: DC | PRN
  Administered 2012-04-02: 50 ug/min via INTRAVENOUS

## 2012-04-02 MED ORDER — PHENYLEPHRINE HCL 10 MG/ML IJ SOLN
INTRAMUSCULAR | Status: DC | PRN
  Administered 2012-04-02: 40 ug via INTRAVENOUS

## 2012-04-02 MED ORDER — DOPAMINE-DEXTROSE 3.2-5 MG/ML-% IV SOLN: 3.0000 ug/kg/min | INTRAVENOUS | Status: DC

## 2012-04-02 MED ORDER — ADULT MULTIVITAMIN W/MINERALS CH
1.0000 | ORAL_TABLET | Freq: Every day | ORAL | Status: DC
  Administered 2012-04-03 – 2012-04-04 (×2): 1 via ORAL
  Filled 2012-04-02 (×2): qty 1

## 2012-04-02 MED ORDER — FENTANYL CITRATE 0.05 MG/ML IJ SOLN
INTRAMUSCULAR | Status: AC
  Filled 2012-04-02: qty 2

## 2012-04-02 MED ORDER — BIOTENE DRY MOUTH MT LIQD
15.0000 mL | Freq: Two times a day (BID) | OROMUCOSAL | Status: DC
  Administered 2012-04-03 (×2): 15 mL via OROMUCOSAL

## 2012-04-02 MED ORDER — CLOPIDOGREL BISULFATE 75 MG PO TABS
75.0000 mg | ORAL_TABLET | Freq: Every day | ORAL | Status: DC
  Administered 2012-04-03 – 2012-04-04 (×2): 75 mg via ORAL
  Filled 2012-04-02 (×2): qty 1

## 2012-04-02 MED ORDER — ALUM & MAG HYDROXIDE-SIMETH 200-200-20 MG/5ML PO SUSP: 15.0000 mL | ORAL | Status: DC | PRN

## 2012-04-02 MED ORDER — ROCURONIUM BROMIDE 100 MG/10ML IV SOLN
INTRAVENOUS | Status: DC | PRN
  Administered 2012-04-02: 50 mg via INTRAVENOUS

## 2012-04-02 MED ORDER — PANTOPRAZOLE SODIUM 40 MG PO TBEC
40.0000 mg | DELAYED_RELEASE_TABLET | Freq: Every day | ORAL | Status: DC
  Administered 2012-04-04: 40 mg via ORAL
  Filled 2012-04-02 (×2): qty 1

## 2012-04-02 MED ORDER — NEOSTIGMINE METHYLSULFATE 1 MG/ML IJ SOLN
INTRAMUSCULAR | Status: DC | PRN
  Administered 2012-04-02: .5 mg via INTRAVENOUS

## 2012-04-02 MED ORDER — FENTANYL CITRATE 0.05 MG/ML IJ SOLN
INTRAMUSCULAR | Status: DC | PRN
  Administered 2012-04-02: 50 ug via INTRAVENOUS
  Administered 2012-04-02: 150 ug via INTRAVENOUS
  Administered 2012-04-02 (×2): 50 ug via INTRAVENOUS

## 2012-04-02 MED ORDER — POTASSIUM CHLORIDE CRYS ER 20 MEQ PO TBCR: 20.0000 meq | EXTENDED_RELEASE_TABLET | Freq: Once | ORAL | Status: AC | PRN

## 2012-04-02 MED ORDER — GLYCOPYRROLATE 0.2 MG/ML IJ SOLN
INTRAMUSCULAR | Status: DC | PRN
  Administered 2012-04-02: 0.1 mg via INTRAVENOUS

## 2012-04-02 MED ORDER — LACTATED RINGERS IV SOLN
INTRAVENOUS | Status: DC | PRN
  Administered 2012-04-02 (×3): via INTRAVENOUS

## 2012-04-02 MED ORDER — TEMAZEPAM 15 MG PO CAPS
15.0000 mg | ORAL_CAPSULE | Freq: Every evening | ORAL | Status: DC | PRN
  Administered 2012-04-03: 15 mg via ORAL
  Filled 2012-04-02: qty 1

## 2012-04-02 MED ORDER — DOXAZOSIN MESYLATE 8 MG PO TABS
8.0000 mg | ORAL_TABLET | Freq: Every day | ORAL | Status: DC
  Administered 2012-04-03 – 2012-04-04 (×2): 8 mg via ORAL
  Filled 2012-04-02 (×2): qty 1

## 2012-04-02 MED ORDER — DEXTROSE 5 % IV SOLN
1.5000 g | Freq: Two times a day (BID) | INTRAVENOUS | Status: AC
  Administered 2012-04-02 – 2012-04-03 (×2): 1.5 g via INTRAVENOUS
  Filled 2012-04-02 (×2): qty 1.5

## 2012-04-02 MED ORDER — HEPARIN SODIUM (PORCINE) 5000 UNIT/ML IJ SOLN
INTRAMUSCULAR | Status: DC | PRN
  Administered 2012-04-02: 08:00:00

## 2012-04-02 MED ORDER — MEPERIDINE HCL 25 MG/ML IJ SOLN: 6.2500 mg | INTRAMUSCULAR | Status: DC | PRN

## 2012-04-02 MED ORDER — AMLODIPINE BESY-BENAZEPRIL HCL 5-20 MG PO CAPS: 2.0000 | ORAL_CAPSULE | Freq: Every day | ORAL | Status: DC

## 2012-04-02 MED ORDER — LABETALOL HCL 5 MG/ML IV SOLN: 10.0000 mg | INTRAVENOUS | Status: DC | PRN

## 2012-04-02 MED ORDER — HEMOSTATIC AGENTS (NO CHARGE) OPTIME
TOPICAL | Status: DC | PRN
  Administered 2012-04-02: 1 via TOPICAL

## 2012-04-02 MED ORDER — INFLUENZA VIRUS VACC SPLIT PF IM SUSP
0.5000 mL | INTRAMUSCULAR | Status: AC
  Administered 2012-04-03: 0.5 mL via INTRAMUSCULAR
  Filled 2012-04-02: qty 0.5

## 2012-04-02 MED ORDER — SODIUM CHLORIDE 0.9 % IV SOLN: INTRAVENOUS | Status: DC

## 2012-04-02 MED ORDER — SODIUM CHLORIDE 0.9 % IV SOLN: 500.0000 mL | Freq: Once | INTRAVENOUS | Status: AC | PRN

## 2012-04-02 MED ORDER — ACETAMINOPHEN 650 MG RE SUPP: 325.0000 mg | RECTAL | Status: DC | PRN

## 2012-04-02 MED ORDER — HEPARIN SODIUM (PORCINE) 1000 UNIT/ML IJ SOLN
INTRAMUSCULAR | Status: DC | PRN
  Administered 2012-04-02: 4500 [IU] via INTRAVENOUS

## 2012-04-02 SURGICAL SUPPLY — 49 items
CANISTER SUCTION 2500CC (MISCELLANEOUS) ×2 IMPLANT
CATH ROBINSON RED A/P 18FR (CATHETERS) ×2 IMPLANT
CATH SUCT 10FR WHISTLE TIP (CATHETERS) ×2 IMPLANT
CLIP TI MEDIUM 24 (CLIP) ×2 IMPLANT
CLIP TI WIDE RED SMALL 24 (CLIP) ×2 IMPLANT
CLOTH BEACON ORANGE TIMEOUT ST (SAFETY) ×2 IMPLANT
COVER SURGICAL LIGHT HANDLE (MISCELLANEOUS) ×2 IMPLANT
CRADLE DONUT ADULT HEAD (MISCELLANEOUS) ×2 IMPLANT
DERMABOND ADVANCED (GAUZE/BANDAGES/DRESSINGS) ×1
DERMABOND ADVANCED .7 DNX12 (GAUZE/BANDAGES/DRESSINGS) ×1 IMPLANT
DRAIN CHANNEL 15F RND FF W/TCR (WOUND CARE) IMPLANT
DRAPE WARM FLUID 44X44 (DRAPE) ×2 IMPLANT
ELECT REM PT RETURN 9FT ADLT (ELECTROSURGICAL) ×2
ELECTRODE REM PT RTRN 9FT ADLT (ELECTROSURGICAL) ×1 IMPLANT
EVACUATOR SILICONE 100CC (DRAIN) IMPLANT
GLOVE BIO SURGEON STRL SZ 6.5 (GLOVE) ×6 IMPLANT
GLOVE BIOGEL PI IND STRL 6.5 (GLOVE) ×3 IMPLANT
GLOVE BIOGEL PI IND STRL 7.0 (GLOVE) ×1 IMPLANT
GLOVE BIOGEL PI IND STRL 7.5 (GLOVE) ×1 IMPLANT
GLOVE BIOGEL PI INDICATOR 6.5 (GLOVE) ×3
GLOVE BIOGEL PI INDICATOR 7.0 (GLOVE) ×1
GLOVE BIOGEL PI INDICATOR 7.5 (GLOVE) ×1
GLOVE ECLIPSE 6.5 STRL STRAW (GLOVE) ×4 IMPLANT
GLOVE SURG SS PI 7.5 STRL IVOR (GLOVE) ×2 IMPLANT
GOWN PREVENTION PLUS XXLARGE (GOWN DISPOSABLE) ×2 IMPLANT
GOWN STRL NON-REIN LRG LVL3 (GOWN DISPOSABLE) ×8 IMPLANT
HEMOSTAT SNOW SURGICEL 2X4 (HEMOSTASIS) ×2 IMPLANT
HEMOSTAT SURGICEL 2X14 (HEMOSTASIS) IMPLANT
INSERT FOGARTY SM (MISCELLANEOUS) IMPLANT
KIT BASIN OR (CUSTOM PROCEDURE TRAY) ×2 IMPLANT
KIT ROOM TURNOVER OR (KITS) ×2 IMPLANT
NS IRRIG 1000ML POUR BTL (IV SOLUTION) ×4 IMPLANT
PACK CAROTID (CUSTOM PROCEDURE TRAY) ×2 IMPLANT
PAD ARMBOARD 7.5X6 YLW CONV (MISCELLANEOUS) ×4 IMPLANT
PATCH VASCULAR VASCU GUARD 1X6 (Vascular Products) ×2 IMPLANT
SHUNT CAROTID BYPASS 10 (VASCULAR PRODUCTS) IMPLANT
SHUNT CAROTID BYPASS 12FRX15.5 (VASCULAR PRODUCTS) IMPLANT
SPECIMEN JAR SMALL (MISCELLANEOUS) ×2 IMPLANT
SPONGE INTESTINAL PEANUT (DISPOSABLE) ×2 IMPLANT
SUT ETHILON 3 0 PS 1 (SUTURE) IMPLANT
SUT PROLENE 6 0 BV (SUTURE) ×6 IMPLANT
SUT PROLENE 7 0 BV 1 (SUTURE) IMPLANT
SUT PROLENE 7 0 BV1 MDA (SUTURE) ×2 IMPLANT
SUT VIC AB 3-0 SH 27 (SUTURE) ×2
SUT VIC AB 3-0 SH 27X BRD (SUTURE) ×2 IMPLANT
SUT VICRYL 4-0 PS2 18IN ABS (SUTURE) ×2 IMPLANT
TOWEL OR 17X24 6PK STRL BLUE (TOWEL DISPOSABLE) ×2 IMPLANT
TOWEL OR 17X26 10 PK STRL BLUE (TOWEL DISPOSABLE) ×2 IMPLANT
WATER STERILE IRR 1000ML POUR (IV SOLUTION) ×2 IMPLANT

## 2012-04-02 NOTE — Transfer of Care (Signed)
Immediate Anesthesia Transfer of Care Note  Patient: Kendra Watts  Procedure(s) Performed: Procedure(s) (LRB) with comments: ENDARTERECTOMY CAROTID (Right)  Patient Location: PACU  Anesthesia Type: General  Level of Consciousness: awake and alert   Airway & Oxygen Therapy: Patient Spontanous Breathing  Post-op Assessment: Report given to PACU RN  Post vital signs: stable  Complications: No apparent anesthesia complications

## 2012-04-02 NOTE — Anesthesia Procedure Notes (Signed)
Procedure Name: Intubation Date/Time: 04/02/2012 7:42 AM Performed by: Ellin Goodie Pre-anesthesia Checklist: Patient identified, Emergency Drugs available, Suction available, Patient being monitored and Timeout performed Patient Re-evaluated:Patient Re-evaluated prior to inductionOxygen Delivery Method: Circle system utilized Preoxygenation: Pre-oxygenation with 100% oxygen Intubation Type: IV induction Ventilation: Mask ventilation without difficulty Laryngoscope Size: Mac and 3 Grade View: Grade I Tube type: Oral Tube size: 7.5 mm Number of attempts: 1 Airway Equipment and Method: Stylet and LTA kit utilized Placement Confirmation: ETT inserted through vocal cords under direct vision,  positive ETCO2 and breath sounds checked- equal and bilateral Secured at: 22 cm Tube secured with: Tape Dental Injury: Teeth and Oropharynx as per pre-operative assessment

## 2012-04-02 NOTE — H&P (View-Only) (Signed)
[facility] [name]   Patient name: Kendra Watts MRN: [id number] DOB: 04/05/1946 Sex: female   Referred by: Dr. Berry  Reason for referral:  Chief Complaint  Patient presents with  . New Evaluation    referredby Dr. Berry  . Carotid    HISTORY OF PRESENT ILLNESS: The patient comes in today for discussions regarding right carotid endarterectomy. 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 This is been followed with serial ultrasound to where it had progressed to greater than 70%. She recently underwent diagnostic angiography which revealed 80% right carotid stenosis. She was referred for carotid endarterectomy.  The patient is medically managed for hypertension and hypercholesterolemia. She has a strong family history of heart disease. Her mother first had a myocardial infarction at age 36 and died at age 48. She has recently had a negative Myoview on 02/07/2012.  The patient remained asymptomatic. She denies a history of numbness or weakness in either extremity. She denies slurred speech. She denies amaurosis fugax.  Past Medical History  Diagnosis Date  . Hypertension   . Hyperlipidemia   . Blockage of kidney vein   . Anemia     Past Surgical History  Procedure Date  . Arch aortogram   . Left subclavian artery angiogram   . Left renal artery angiogram   . Renal artery stent     left  . Appendectomy   . Partial hysterectomy     History   Social History  . Marital Status: Single    Spouse Name: N/A    Number of Children: N/A  . Years of Education: N/A   Occupational History  . HR administrator    Social History Main Topics  . Smoking status: Current Everyday Smoker -- 1.0 packs/day for 40 years    Types: Cigarettes  . Smokeless tobacco: Never Used   Comment: has a prescription for Chantix and states she will get it filled and has a desire to stop smoking  . Alcohol Use: Yes   occassional  . Drug Use: No  . Sexually Active: Not on file   Other Topics Concern  . Not on file   Social History Narrative  . No narrative on file    Family History  Problem Relation Age of Onset  . Heart disease Maternal Grandmother   . Heart disease Mother     Allergies as of 03/09/2012  . (No Known Allergies)    Current Outpatient Prescriptions on File Prior to Visit  Medication Sig Dispense Refill  . amLODipine-benazepril (LOTREL) 5-20 MG per capsule Take 1 capsule by mouth daily.        . aspirin 325 MG tablet Take 325 mg by mouth daily.        . clopidogrel (PLAVIX) 75 MG tablet Take 75 mg by mouth daily.       . doxazosin (CARDURA) 8 MG tablet Take 8 mg by mouth every morning.        . Fe Fum-FePoly-FA-Vit C-Vit B3 (INTEGRA F PO) Take 1 capsule by mouth daily.      . Multiple Vitamins-Minerals (MULTIVITAMIN WITH MINERALS) tablet Take 1 tablet by mouth daily.        . rosuvastatin (CRESTOR) 10 MG tablet Take 10 mg by mouth daily.           REVIEW OF SYSTEMS: Cardiovascular: No chest pain, chest pressure, palpitations, orthopnea, or dyspnea on exertion.  Pulmonary: No productive cough, asthma   or wheezing. Neurologic: No weakness, paresthesias, aphasia, or amaurosis. No dizziness. Hematologic: No bleeding problems or clotting disorders. Musculoskeletal: No joint pain or joint swelling. Gastrointestinal: No blood in stool or hematemesis Genitourinary: No dysuria or hematuria. Psychiatric:: No history of major depression. Integumentary: No rashes or ulcers. Constitutional: No fever or chills.  PHYSICAL EXAMINATION: General: The patient appears their stated age.  Vital signs are BP 152/64  Pulse 88  Resp 18  Ht 5' (1.524 m)  Wt 99 lb 4.8 oz (45.042 kg)  BMI 19.39 kg/m2 HEENT:  No gross abnormalities Pulmonary: Respirations are non-labored Abdomen: Soft and non-tender  Musculoskeletal: There are no major deformities.   Neurologic: No focal weakness or  paresthesias are detected, Skin: There are no ulcer or rashes noted. Psychiatric: The patient has normal affect. Cardiovascular: There is a regular rate and rhythm without significant murmur appreciated. Bilateral carotid bruit.  Diagnostic Studies: I have reviewed her angiogram which reveals 80% right carotid stenosis. She has 20% left carotid stenosis  Outside Studies/Documentation Historical records were reviewed.  They showed high-grade right carotid stenosis, asymptomatic  Medication Changes: The patient will stop her Plavix 5 days prior to her operation  Assessment:  Asymptomatic right carotid stenosis Plan: I discussed the pathophysiology of carotid occlusive disease and the potential complications associated with each treatment modality. We have decided to proceed with right carotid endarterectomy on Thursday, July 12. The risks and benefits were discussed with the patient including the risk of stroke, the risk of nerve injury, cardiopulmonary complications, infection, and bleeding. All of her questions were answered. She does live alone and therefore potentially might require a 2 day hospital stay. I will stop her Plavix 5 days prior to her operation.     V. Wells Alano Blasco IV, M.D. [facility] [place] Office: [phone] Pager:  [phone]   

## 2012-04-02 NOTE — Telephone Encounter (Addendum)
Message copied by Shari Prows on Thu Apr 02, 2012 11:35 AM ------      Message from: Lorin Mercy K      Created: Thu Apr 02, 2012 11:17 AM      Regarding: schedule                   ----- Message -----         From: Dara Lords, PA         Sent: 04/02/2012   9:54 AM           To: Sharee Pimple, CMA            S/p right CEA 04/02/12 by [facility]            F/u with him in 2 weeks.            Thanks,      Lelon Mast  i scheduled an appt for this pt on 04/13/12 at 1:45pm. I left voice message for this pt and mailed letter to pt. awt

## 2012-04-02 NOTE — Op Note (Signed)
[facility] [place]  Patient name: Kendra Watts MRN: [id number] DOB: October 10, 1945 Sex: female  04/02/2012 Pre-operative Diagnosis: Asymptomatic   right carotid stenosis Post-operative diagnosis:  Same Surgeon:  Jorge Ny Assistants:  S. Rhyne, P.A. Procedure:    right carotid Endarterectomy with bovine pericardial  patch angioplasty Anesthesia:  General Blood Loss:  See anesthesia record Specimens:  Carotid Plaque to pathology  Findings:  85 %stenosis; Thrombus:  bnone  Indications:  66 year old with progressive asymptomatic carotid stenosis by doppler which was confirmed with angiography.  Procedure:  The patient was identified in the holding area and taken to [facility] OR ROOM 11  The patient was then placed supine on the table.   General endotrachial anesthesia was administered.  The patient was prepped and draped in the usual sterile fashion.  A time out was called and antibiotics were administered.  The incision was made along the anterior border of the right sternocleidomastoid muscle.  Cautery was used to dissect through the subcutaneous tissue.  The platysma muscle was divided with cautery.  The internal jugular vein was exposed along its anterior medial border.  The common facial vein was exposed and then divided between 2-0 silk ties and metal clips.  The common carotid artery was then circumferentially exposed and encircled with an umbilical tape.  The vagus nerve was identified and protected.  Next sharp dissection was used to expose the external carotid artery and the superior thyroid artery.  The were encircled with a blue vessel loop and a 2-0 silk tie respectively.  Finally, the internal carotid was carefully dissected free.  An umbilical tape was placed around the internal carotid artery distal to the diseased segment.  The hypoglossal nerve was visualized throughout and protected.  The patient was given systemic heparinization.  A bovine carotid patch was  selected and prepared on the back table.  A 10 french shunt was also prepared.  After blood pressure readings were appropriate and the heparin had been given time to circulate, the internal carotid artery was occluded with a baby Gregory clamp.  The external and common carotid arteries were then occluded with vascular clamps and the 2-0 tie tightened on the superior thyroid artery.  A #11 blade was used to make an arteriotomy in the common carotid artery.  This was extended with Potts scissors along the anterior and lateral border of the common and internal carotid artery.  Approximately 85% stenosis was identified.  There was no thrombus identified.  I elected to not place the 10 french shunt because there was pulsatile backbleeding from the internal carotid artery.  A kleiner kuntz elevator was used to perform endarterectomy.  An eversion endarterectomy was performed in the external carotid artery.  A good distal endpoint was obtained in the internal carotid artery.  The specimen was removed and sent to pathology.  Heparinized saline was used to irrigate the endarterectomized field.  All potential embolic debris was removed.  Bovine pericardial patch angioplasty was then performed using a running 6-0 Prolene. The common internal and external carotid arteries were all appropriately flushed. The artery was again irrigated with heparin saline.  The anastomosis was then secured. The clamp was first released on the external carotid artery followed by the common carotid artery approximately 30 seconds later, bloodflow was reestablish through the internal carotid artery.  Next, a hand-held  Doppler was used to evaluate the signals in the common, external, and internal  carotid arteries, all of which had  appropriate signals. I then administered  50 mg protamine. The wound was then irrigated.  After hemostasis was achieved, the carotid sheath was reapproximated with 3-0 Vicryl. The  platysma muscle was reapproximated  with running 3-0 Vicryl. The skin  was closed with 4-0 Vicryl. Dermabond was placed on the skin. The  patient was then successfully extubated. Her neurologic exam was  similar to his preprocedural exam. The patient was then taken to recovery room  in stable condition. There were no complications.     Disposition:  To PACU in stable condition.  Relevant Operative Details:  Hypoglossal nerve was easily visualized and protected.  Coral reef type plaque at the bifurcation.  Juleen China, M.D. [facility] [place] Office: [phone] Pager:  [phone]

## 2012-04-02 NOTE — Progress Notes (Signed)
MEDICATION RELATED CONSULT NOTE - INITIAL   Pharmacy Consult:  Pharmacy to renally adjust antibiotics  No Known Allergies  Patient Measurements: Height: 5' (152.4 cm) Weight: 100 lb 12 oz (45.7 kg) IBW/kg (Calculated) : 45.5   Vital Signs: Temp: 97.6 F (36.4 C) (09/12 1607) Temp src: Oral (09/12 1607) BP: 106/75 mmHg (09/12 1630) Pulse Rate: 80  (09/12 1630) Intake/Output from this shift: Total I/O In: 2975 [I.V.:2975] Out: -   Labs: No results found for this basename: WBC:3,HGB:3,HCT:3,PLT:3,APTT:3;INR:3,CREATININE:3,LABCREA:3,CREATININE:3,CREAT24HRUR:3,MG:3,PHOS:3,ALBUMIN:3,PROT:3,ALBUMIN:3,AST:3,ALT:3,ALKPHOS:3,BILITOT:3,BILIDIR:3,IBILI:3 in the last 72 hours Estimated Creatinine Clearance: 45.3 ml/min (by C-G formula based on Cr of 0.89).   Microbiology: Recent Results (from the past 720 hour(s))  SURGICAL PCR SCREEN     Status: Normal   Collection Time   03/27/12 11:19 AM      Component Value Range Status Comment   MRSA, PCR NEGATIVE  NEGATIVE Final    Staphylococcus aureus NEGATIVE  NEGATIVE Final     Medical History: Past Medical History  Diagnosis Date  . Hypertension   . Hyperlipidemia   . Blockage of kidney vein   . Anemia   . Heart murmur     hx  . Peripheral vascular disease     renal artery lft stent      Assessment: 42 YOF s/p carotid endarterectomy to continue post-op Zinacef x 2 doses.  Patient with good renal function.    Plan:  - Continue Zinacef 1.5gm IV Q12H x 2 doses - Pharmacy will sign off as abx dosing adjustment is unnecessary.  Thank you for the consult!      Shaconda Hajduk D. Laney Potash, PharmD, BCPS Pager:  [phone] 04/02/2012, 4:39 PM

## 2012-04-02 NOTE — Interval H&P Note (Signed)
History and Physical Interval Note:  04/02/2012 7:15 AM  Kendra Watts  has presented today for surgery, with the diagnosis of RIGHT ICA STENOSIS  The various methods of treatment have been discussed with the patient and family. After consideration of risks, benefits and other options for treatment, the patient has consented to  Procedure(s) (LRB) with comments: ENDARTERECTOMY CAROTID (Right) as a surgical intervention .  The patient's history has been reviewed, patient examined, no change in status, stable for surgery.  I have reviewed the patient's chart and labs.  Questions were answered to the patient's satisfaction.     [name] IV, V. WELLS

## 2012-04-02 NOTE — Anesthesia Preprocedure Evaluation (Addendum)
Anesthesia Evaluation  Patient identified by MRN, date of birth, ID band Patient awake    Reviewed: Allergy & Precautions, H&P , NPO status , Patient's Chart, lab work & pertinent test results, reviewed documented beta blocker date and time   History of Anesthesia Complications Negative for: history of anesthetic complications  Airway Mallampati: I TM Distance: >3 FB Neck ROM: Full    Dental  (+) Dental Advisory Given, Partial Lower and Partial Upper   Pulmonary shortness of breath and with exertion, Current Smoker,  breath sounds clear to auscultation  Pulmonary exam normal       Cardiovascular hypertension, Pt. on medications + Peripheral Vascular Disease - Valvular Problems/MurmursRhythm:Regular Rate:Normal  7/13 myoview: no ischemia, normal LVF   Neuro/Psych Carotid stenosis: for CEA negative psych ROS   GI/Hepatic negative GI ROS, Neg liver ROS,   Endo/Other  negative endocrine ROS  Renal/GU negative Renal ROS  negative genitourinary   Musculoskeletal negative musculoskeletal ROS (+)   Abdominal (+)  Abdomen: soft. Bowel sounds: normal.  Peds negative pediatric ROS (+)  Hematology negative hematology ROS (+)   Anesthesia Other Findings   Reproductive/Obstetrics negative OB ROS                         Anesthesia Physical Anesthesia Plan  ASA: III  Anesthesia Plan: General   Post-op Pain Management:    Induction: Intravenous  Airway Management Planned: Oral ETT  Additional Equipment: Arterial line  Intra-op Plan:   Post-operative Plan: Extubation in OR  Informed Consent: I have reviewed the patients History and Physical, chart, labs and discussed the procedure including the risks, benefits and alternatives for the proposed anesthesia with the patient or authorized representative who has indicated his/her understanding and acceptance.   Dental advisory given  Plan Discussed  with: CRNA and Surgeon  Anesthesia Plan Comments: (Plan routine monitors, A line, GETA )        Anesthesia Quick Evaluation

## 2012-04-02 NOTE — Anesthesia Postprocedure Evaluation (Signed)
  Anesthesia Post-op Note  Patient: Kendra Watts  Procedure(s) Performed: Procedure(s) (LRB) with comments: ENDARTERECTOMY CAROTID (Right)  Patient Location: PACU  Anesthesia Type: General  Level of Consciousness: awake, alert , oriented and patient cooperative  Airway and Oxygen Therapy: Patient Spontanous Breathing and Patient connected to nasal cannula oxygen  Post-op Pain: none  Post-op Assessment: Post-op Vital signs reviewed, Patient's Cardiovascular Status Stable, Respiratory Function Stable, Patent Airway, No signs of Nausea or vomiting and Pain level controlled  Post-op Vital Signs: Reviewed and stable  Complications: No apparent anesthesia complications

## 2012-04-03 LAB — CBC
HCT: 34.6 % — ABNORMAL LOW (ref 36.0–46.0)
Hemoglobin: 11.6 g/dL — ABNORMAL LOW (ref 12.0–15.0)
MCH: 29.3 pg (ref 26.0–34.0)
MCHC: 33.5 g/dL (ref 30.0–36.0)
MCV: 87.4 fL (ref 78.0–100.0)
Platelets: 202 10*3/uL (ref 150–400)
RBC: 3.96 MIL/uL (ref 3.87–5.11)
RDW: 19.2 % — ABNORMAL HIGH (ref 11.5–15.5)
WBC: 12.2 10*3/uL — ABNORMAL HIGH (ref 4.0–10.5)

## 2012-04-03 LAB — BASIC METABOLIC PANEL
BUN: 7 mg/dL (ref 6–23)
CO2: 28 mEq/L (ref 19–32)
Calcium: 8.7 mg/dL (ref 8.4–10.5)
Chloride: 104 mEq/L (ref 96–112)
Creatinine, Ser: 0.69 mg/dL (ref 0.50–1.10)
GFR calc Af Amer: >90 mL/min (ref >90)
GFR calc non Af Amer: 89 mL/min — ABNORMAL LOW (ref >90)
Glucose, Bld: 114 mg/dL — ABNORMAL HIGH (ref 70–99)
Potassium: 3.7 mEq/L (ref 3.5–5.1)
Sodium: 140 mEq/L (ref 135–145)

## 2012-04-03 NOTE — Progress Notes (Signed)
Patient walked this am with no complaints; walked halfway around unit; took patient without O2 since pt. Is not on O2 at home and pt. desatted to the upper 70's; no shortness of breath or increased respirations noted.  Will continue to monitor.   Vivi Martens RN

## 2012-04-03 NOTE — Progress Notes (Signed)
Utilization review completed.  

## 2012-04-03 NOTE — Discharge Summary (Signed)
[facility] Discharge Summary  Kendra Watts Nov 03, 1945 66 y.o. female  [id number]  Admission Date: 04/02/2012  Discharge Date: 04-04-2012  Physician: Nada Libman, MD  Admission Diagnosis: RIGHT ICA STENOSIS   HPI:   This is a 66 y.o. female who comes in today for discussions regarding right carotid endarterectomy. 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 This is been followed with serial ultrasound to where it had progressed to greater than 70%. She recently underwent diagnostic angiography which revealed 80% right carotid stenosis. She was referred for carotid endarterectomy.  The patient is medically managed for hypertension and hypercholesterolemia. She has a strong family history of heart disease. Her mother first had a myocardial infarction at age 4 and died at age 75. She has recently had a negative Myoview on 02/07/2012.  The patient remained asymptomatic. She denies a history of numbness or weakness in either extremity. She denies slurred speech. She denies amaurosis fugax.  Hospital Course:  The patient was admitted to the hospital and taken to the operating room on 04/02/2012 and underwent right carotid endarterectomy.  The pt tolerated the procedure well and was transported to the PACU in good condition.   By POD 1, the pt neuro status in tact.  She is still requiring 1LO2NC.  We will send her home on O2 and she will f/u with Her primary care doctor.  The remainder of the hospital course consisted of increasing mobilization and increasing intake of solids without difficulty.    Basename 04/03/12 0425  NA 140  K 3.7  CL 104  CO2 28  GLUCOSE 114*  BUN 7  CALCIUM 8.7    Basename 04/03/12 0425  WBC 12.2*  HGB 11.6*  HCT 34.6*  PLT 202   No results found for this basename: INR:2 in the last 72 hours   Discharge Instructions:   The patient is discharged to home with extensive  instructions on wound care and progressive ambulation.  They are instructed not to drive or perform any heavy lifting until returning to see the physician in his office.  Discharge Orders    Future Appointments: Provider: Department: Dept Phone: Center:   04/13/2012 1:45 PM Nada Libman, MD [facility] [phone] [facility]     Future Orders Please Complete By Expires   Resume previous diet      Driving Restrictions      Comments:   No driving for 2 weeks   Lifting restrictions      Comments:   No lifting for 6 weeks   Call MD for:  temperature >100.5      Call MD for:  redness, tenderness, or signs of infection (pain, swelling, bleeding, redness, odor or green/yellow discharge around incision site)      Call MD for:  severe or increased pain, loss or decreased feeling  in affected limb(s)      Discharge wound care:      Comments:   Shower daily with soap and water starting 04/04/12   CAROTID Sugery: Call MD for difficulty swallowing or speaking; weakness in arms or legs that is a new symtom; severe headache.  If you have increased swelling in the neck and/or  are having difficulty breathing, CALL 911         Discharge Diagnosis:  RIGHT ICA STENOSIS  Secondary Diagnosis: Patient Active Problem List  Diagnosis  . Occlusion and stenosis of carotid artery without mention of cerebral infarction  Past Medical History  Diagnosis Date  . Hypertension   . Hyperlipidemia   . Blockage of kidney vein   . Anemia   . Heart murmur     hx  . Peripheral vascular disease     renal artery lft stent     Kendra Watts, Kendra Watts  Home Medication Instructions [id number]   Printed on:04/03/12 0721  Medication Information                    aspirin 325 MG tablet Take 325 mg by mouth daily.             clopidogrel (PLAVIX) 75 MG tablet Take 75 mg by mouth daily.            doxazosin (CARDURA) 8 MG tablet Take 8 mg by mouth every morning.             rosuvastatin (CRESTOR) 10 MG  tablet Take 10 mg by mouth daily.             amLODipine-benazepril (LOTREL) 5-20 MG per capsule Take 2 capsules by mouth daily.            Multiple Vitamins-Minerals (MULTIVITAMIN WITH MINERALS) tablet Take 1 tablet by mouth daily.             Fe Fum-FePoly-FA-Vit C-Vit B3 (INTEGRA F PO) Take 1 capsule by mouth daily.           oxyCODONE (ROXICODONE) 5 MG immediate release tablet Take 1 tablet (5 mg total) by mouth every 6 (six) hours as needed for pain.             Disposition: Stable on 1 L of O2 via [place]ck incision C/D/I  Patient's condition: is Stable  Follow up: 1. Dr.  Myra Gianotti in 2 weeks.   [name], PA-C [facility] [phone] 04/03/2012  7:21 AM  I agree with the above. The patient suffered from postoperative respiratory insufficiency which is requiring supplemental oxygen. We have tried to wean her off oxygen however she is still requiring 1 L. Without oxygen her saturation levels go into the mid 80s. I recommended that she go home with oxygen, with the hopes of weaning her off of this within the next one to 2 weeks.  Durene Cal

## 2012-04-03 NOTE — Progress Notes (Addendum)
[facility] Progress Note  04/03/2012 7:17 AM POD 1  Subjective:  "I'm ready to go home"  Tm 99.3 VSS 91% 4LO2NC Filed Vitals:   04/03/12 0400  BP: 144/55  Pulse:   Temp: 98.7 F (37.1 C)  Resp:      Physical Exam: Neuro:  In tact Incision:  C/d/i  CBC    Component Value Date/Time   WBC 12.2* 04/03/2012 0425   RBC 3.96 04/03/2012 0425   HGB 11.6* 04/03/2012 0425   HCT 34.6* 04/03/2012 0425   PLT 202 04/03/2012 0425   MCV 87.4 04/03/2012 0425   MCH 29.3 04/03/2012 0425   MCHC 33.5 04/03/2012 0425   RDW 19.2* 04/03/2012 0425    BMET    Component Value Date/Time   NA 140 04/03/2012 0425   K 3.7 04/03/2012 0425   CL 104 04/03/2012 0425   CO2 28 04/03/2012 0425   GLUCOSE 114* 04/03/2012 0425   BUN 7 04/03/2012 0425   CREATININE 0.69 04/03/2012 0425   CALCIUM 8.7 04/03/2012 0425   GFRNONAA 89* 04/03/2012 0425   GFRAA >90 04/03/2012 0425     Intake/Output Summary (Last 24 hours) at 04/03/12 0717 Last data filed at 04/03/12 0500  Gross per 24 hour  Intake   3925 ml  Output      0 ml  Net   3925 ml      Assessment/Plan:  This is a 66 y.o. female who is s/p right CEA POD 1  -pt doing well this am. -still requiring 4L O2. (She is not on O2 at home) Will mobilize pt this am and use IS every hour. -will check back on her later this am. -she may need to be discharged on home O2. -d/c IVF  Doreatha Massed, PA-C [facility] [phone]  Post op respiratory insufficiency.  She is in the high 80's off of O2 without respiratry distress.  I would recommend ambulation on frequent IS with the hope of sending her home tomorrow without O2. Otherwise she is ready for discharge.  She is neurologically intact  Kendra Watts

## 2012-04-03 NOTE — Progress Notes (Signed)
Patient walking in hall and oxygen level down between 80-81 percent.  Back to room sitting in chair oxygen level now 93% on room air.  Continue to monitor.

## 2012-04-04 NOTE — Progress Notes (Addendum)
VASCULAR AND VEIN SURGERY POST - OP CEA PROGRESS NOTE  Date of Surgery: 04/02/2012 Surgeon: Surgeon(s): Nada Libman, MD 2 Days Post-Op right Carotid Endarterectomy .  HPI: Kendra Watts is a 66 y.o. female who is 2 Days Post-Op right Carotid Endarterectomy . Patient is doing well. Pre-operative symptoms are Improved Patient denies headache; Patient denies difficulty swallowing; denies weakness in upper or lower extremities; Pt. denies other symptoms of stroke or TIA.  IMAGING: No results found.  Significant Diagnostic Studies: CBC Lab Results  Component Value Date   WBC 12.2* 04/03/2012   HGB 11.6* 04/03/2012   HCT 34.6* 04/03/2012   MCV 87.4 04/03/2012   PLT 202 04/03/2012    BMET    Component Value Date/Time   NA 140 04/03/2012 0425   K 3.7 04/03/2012 0425   CL 104 04/03/2012 0425   CO2 28 04/03/2012 0425   GLUCOSE 114* 04/03/2012 0425   BUN 7 04/03/2012 0425   CREATININE 0.69 04/03/2012 0425   CALCIUM 8.7 04/03/2012 0425   GFRNONAA 89* 04/03/2012 0425   GFRAA >90 04/03/2012 0425    COAG Lab Results  Component Value Date   INR 1.06 03/27/2012   No results found for this basename: PTT      Intake/Output Summary (Last 24 hours) at 04/04/12 0729 Last data filed at 04/03/12 1300  Gross per 24 hour  Intake    480 ml  Output      0 ml  Net    480 ml    Physical Exam:  BP Readings from Last 3 Encounters:  04/04/12 147/66  04/04/12 147/66  03/27/12 93/61   Temp Readings from Last 3 Encounters:  04/04/12 98.5 F (36.9 C) Oral  04/04/12 98.5 F (36.9 C) Oral  03/27/12 98.3 F (36.8 C)    SpO2 Readings from Last 3 Encounters:  04/04/12 84%  04/04/12 84%  03/27/12 96%   Pulse Readings from Last 3 Encounters:  04/04/12 76  04/04/12 76  03/27/12 84    Pt is A&O x 3 Gait is normal Speech is fluent right Neck Wound is clean, dry, intact or healing well Patient with Negative tongue deviation and Negative facial droop Pt has good and equal strength in  all extremities She is SAT 91% on 2L  Assessment: Kendra Watts is a 66 y.o. female is S/P Right Carotid endarterectomy Pt is voiding, ambulating and taking po well She will have to f/u with her primary care Dr.Clarkra, Rama Georgianne Fick)   Plan: Discharge to: Home Follow-up in 2 weeks  I have ordered home o2  she will have to call Primary care doctor for a follow up visit this week. She has a 40 year history of smoking  Mosetta Pigeon [phone] 04/04/2012 7:29 AM   Will try to wean off of O2, but may need to d/c with o2.  Durene Cal

## 2012-04-04 NOTE — Progress Notes (Signed)
Discharge instructions given, verbalized understanding, forms signed and copies given.  PIV removed-tip intact. VSS. No signs of infection at incision site.

## 2012-04-04 NOTE — Progress Notes (Signed)
04/04/2012 1420 Faxed orders to [facility] for oxygen for scheduled d/c today. Explained to pt to notify [facility] when she arrives home to deliver oxygen. [facility] info on d/c instructions. Isidoro Donning RN CCM Case Mgmt phone [phone]

## 2012-04-04 NOTE — Progress Notes (Addendum)
1L/ [place] pt oxygen sats are at 90-92 %. Placed on room air sats dropped to 82% Ambulated off oxygen on room air O2 sats dropped to 80- 85%. Placed back on 1L/[place] and sats improved back up to 92%.

## 2012-04-06 ENCOUNTER — Encounter (HOSPITAL_COMMUNITY): Payer: Self-pay | Admitting: Surgery

## 2012-04-10 ENCOUNTER — Encounter: Payer: Self-pay | Admitting: Surgery

## 2012-04-13 ENCOUNTER — Ambulatory Visit (INDEPENDENT_AMBULATORY_CARE_PROVIDER_SITE_OTHER): Payer: Medicare Other | Admitting: Surgery

## 2012-04-13 ENCOUNTER — Encounter: Payer: Self-pay | Admitting: Surgery

## 2012-04-13 VITALS — BP 113/59 | HR 91 | Temp 98.5°F | Resp 14 | Ht 60.0 in | Wt 94.0 lb

## 2012-04-13 DIAGNOSIS — I6529 Occlusion and stenosis of unspecified carotid artery: Secondary | ICD-10-CM

## 2012-04-13 NOTE — Progress Notes (Signed)
The patient comes back today for followup. She is status post right carotid endarterectomy on 04/02/2012 for a high-grade right carotid stenosis. At operation, I found a cold reef type plaque with approximately 80-85% stenosis. The patient tolerated her operation without difficulty. She was kept in the hospital and next today do to low oxygen levels. We tried to wean her off of her oxygen however were unsuccessful and therefore she was discharged to home with home oxygen. She states that she is only wearing this at night. She is not having difficulty during the day. She reports no neurologic symptoms.  On examination, she is neurologically intact. Her incision is well-healed.  From my perspective I believe it is okay to discontinue her oxygen. She is to see her primary care physician Kendra Watts who will weigh in on this matter. I have scheduled to have the patient come and see me in 6 months. She will be getting her carotid duplex ultrasound [facility].

## 2012-06-15 ENCOUNTER — Other Ambulatory Visit (HOSPITAL_COMMUNITY): Payer: Self-pay | Admitting: Cardiovascular Disease

## 2012-06-15 DIAGNOSIS — I6529 Occlusion and stenosis of unspecified carotid artery: Secondary | ICD-10-CM

## 2012-06-30 ENCOUNTER — Encounter (HOSPITAL_COMMUNITY): Payer: 59

## 2012-07-17 ENCOUNTER — Ambulatory Visit (HOSPITAL_COMMUNITY)
Admission: RE | Admit: 2012-07-17 | Discharge: 2012-07-17 | Disposition: A | Discharge status: Home / Self Care | Payer: 59 | Source: Ambulatory Visit | Attending: Cardiovascular Disease | Admitting: Cardiovascular Disease

## 2012-07-17 DIAGNOSIS — I6529 Occlusion and stenosis of unspecified carotid artery: Secondary | ICD-10-CM

## 2012-07-17 NOTE — Progress Notes (Signed)
Carotid duplex completed 

## 2012-10-09 ENCOUNTER — Encounter: Payer: Self-pay | Admitting: Surgery

## 2012-10-12 ENCOUNTER — Ambulatory Visit (INDEPENDENT_AMBULATORY_CARE_PROVIDER_SITE_OTHER): Payer: 59 | Admitting: Surgery

## 2012-10-12 ENCOUNTER — Encounter: Payer: Self-pay | Admitting: Surgery

## 2012-10-12 DIAGNOSIS — I6529 Occlusion and stenosis of unspecified carotid artery: Secondary | ICD-10-CM | POA: Diagnosis not present

## 2012-10-12 NOTE — Progress Notes (Signed)
[facility] [place]   Patient name: Kendra Watts MRN: [id number] DOB: 13-Jan-1946 Sex: female     Chief Complaint  Patient presents with  . Carotid    6 month f/up , pt. had U/S at [facility] and [facility] 07/07/12.  RIGHT  cea 04/02/12    HISTORY OF PRESENT ILLNESS: The patient is back today for followup. She is status post right carotid endarterectomy on 04/02/2012 done for asymptomatic stenosis. Her postoperative course was uncomplicated. She reports no new neurologic symptoms. She had her 6 month carotid ultrasound done at [facility] part vascular. She denies numbness or weakness in either extremity. She denies slurred speech. She denies amaurosis fugax.  Past Medical History  Diagnosis Date  . Hypertension   . Hyperlipidemia   . Blockage of kidney vein   . Anemia   . Heart murmur     hx  . Peripheral vascular disease     renal artery lft stent    Past Surgical History  Procedure Laterality Date  . Arch aortogram    . Left subclavian artery angiogram    . Left renal artery angiogram    . Renal artery stent      left  . Appendectomy    . Partial hysterectomy    . Tonsillectomy    . Endarterectomy  04/02/2012    Procedure: ENDARTERECTOMY CAROTID;  Surgeon: Nada Libman, MD;  Location: [facility] OR;  Service: Vascular;  Laterality: Right;  . Carotid endarterectomy  04/02/12    Right  cea    History   Social History  . Marital Status: Single    Spouse Name: N/A    Number of Children: N/A  . Years of Education: N/A   Occupational History  . HR administrator    Social History Main Topics  . Smoking status: Current Every Day Smoker -- 1.00 packs/day for 40 years    Types: Cigarettes  . Smokeless tobacco: Never Used     Comment: has a prescription for Chantix and states she will get it filled and has a desire to stop smoking occ alcohol  . Alcohol Use: Yes     Comment: occassional  . Drug Use: No  . Sexually Active: Not on file    Other Topics Concern  . Not on file   Social History Narrative  . No narrative on file    Family History  Problem Relation Age of Onset  . Heart disease Maternal Grandmother   . Heart disease Mother   . Cancer Father     Allergies as of 10/12/2012  . (No Known Allergies)    Current Outpatient Prescriptions on File Prior to Visit  Medication Sig Dispense Refill  . amLODipine-benazepril (LOTREL) 5-20 MG per capsule Take 2 capsules by mouth daily.       Marland Kitchen aspirin 325 MG tablet Take 325 mg by mouth daily.        . clopidogrel (PLAVIX) 75 MG tablet Take 75 mg by mouth daily.       Marland Kitchen doxazosin (CARDURA) 8 MG tablet Take 8 mg by mouth every morning.        . Multiple Vitamins-Minerals (MULTIVITAMIN WITH MINERALS) tablet Take 1 tablet by mouth daily.        . rosuvastatin (CRESTOR) 10 MG tablet Take 10 mg by mouth daily.        . Fe Fum-FePoly-FA-Vit C-Vit B3 (INTEGRA F PO) Take 1 capsule by mouth daily.  No current facility-administered medications on file prior to visit.     REVIEW OF SYSTEMS: No changes from prior visit  PHYSICAL EXAMINATION:   Vital signs are BP 138/74  Pulse 95  Resp 16  Ht 5' 5.25" (1.657 m)  Wt 98 lb (44.453 kg)  BMI 16.19 kg/m2  SpO2 94% General: The patient appears their stated age. HEENT:  No gross abnormalities Pulmonary:  Non labored breathing Musculoskeletal: There are no major deformities. Neurologic: No focal weakness or paresthesias are detected, Skin: There are no ulcer or rashes noted. Psychiatric: The patient has normal affect. Cardiovascular: There is a regular rate and rhythm without significant murmur appreciated. Bilateral carotid bruit   Diagnostic Studies  I have reviewed her ultrasound from [place]. This shows widely patent bilateral internal carotid arteries. There is a comment of a 40-59% right common carotid stenosis.  Assessment: Status post right carotid endarterectomy for asymptomatic stenosis Plan: The  patient will continue to get surveillance ultrasound done at [facility] part vascular. Close evaluation of the right common carotid lesion at the carotid bulb need to be performed. The patient will contact me on a when necessary basis  V. Charlena Cross, M.D. [facility] [place] Office: [phone] Pager:  [phone]

## 2012-11-26 DIAGNOSIS — I739 Peripheral vascular disease, unspecified: Secondary | ICD-10-CM | POA: Diagnosis not present

## 2012-11-26 DIAGNOSIS — E782 Mixed hyperlipidemia: Secondary | ICD-10-CM | POA: Diagnosis not present

## 2012-11-26 DIAGNOSIS — I10 Essential (primary) hypertension: Secondary | ICD-10-CM | POA: Diagnosis not present

## 2012-11-26 DIAGNOSIS — F17200 Nicotine dependence, unspecified, uncomplicated: Secondary | ICD-10-CM | POA: Diagnosis not present

## 2012-12-16 ENCOUNTER — Telehealth: Payer: Self-pay | Admitting: Gastroenterology

## 2012-12-16 DIAGNOSIS — I10 Essential (primary) hypertension: Secondary | ICD-10-CM | POA: Diagnosis not present

## 2012-12-16 DIAGNOSIS — E782 Mixed hyperlipidemia: Secondary | ICD-10-CM | POA: Diagnosis not present

## 2012-12-16 DIAGNOSIS — D649 Anemia, unspecified: Secondary | ICD-10-CM | POA: Diagnosis not present

## 2012-12-16 NOTE — Telephone Encounter (Signed)
Pt has had loose stools for several months 2 times daily no blood, no mucous, no abd pain, or fever.  She will try 1 imodium daily and has appt set for 12/28/12 to see Dr Christella Hartigan

## 2012-12-16 NOTE — Telephone Encounter (Signed)
Great, thanks

## 2012-12-22 DIAGNOSIS — E782 Mixed hyperlipidemia: Secondary | ICD-10-CM | POA: Diagnosis not present

## 2012-12-22 DIAGNOSIS — I739 Peripheral vascular disease, unspecified: Secondary | ICD-10-CM | POA: Diagnosis not present

## 2012-12-22 DIAGNOSIS — I10 Essential (primary) hypertension: Secondary | ICD-10-CM | POA: Diagnosis not present

## 2012-12-22 DIAGNOSIS — E0590 Thyrotoxicosis, unspecified without thyrotoxic crisis or storm: Secondary | ICD-10-CM | POA: Diagnosis not present

## 2012-12-28 ENCOUNTER — Ambulatory Visit: Payer: Medicare Other | Admitting: Gastroenterology

## 2013-01-12 DIAGNOSIS — I10 Essential (primary) hypertension: Secondary | ICD-10-CM | POA: Diagnosis not present

## 2013-01-12 DIAGNOSIS — G4700 Insomnia, unspecified: Secondary | ICD-10-CM | POA: Diagnosis not present

## 2013-01-12 DIAGNOSIS — E782 Mixed hyperlipidemia: Secondary | ICD-10-CM | POA: Diagnosis not present

## 2013-01-12 DIAGNOSIS — E0590 Thyrotoxicosis, unspecified without thyrotoxic crisis or storm: Secondary | ICD-10-CM | POA: Diagnosis not present

## 2013-02-16 DIAGNOSIS — E0590 Thyrotoxicosis, unspecified without thyrotoxic crisis or storm: Secondary | ICD-10-CM | POA: Diagnosis not present

## 2013-02-17 ENCOUNTER — Other Ambulatory Visit (HOSPITAL_COMMUNITY): Payer: Self-pay | Admitting: Endocrinology

## 2013-02-17 DIAGNOSIS — E0590 Thyrotoxicosis, unspecified without thyrotoxic crisis or storm: Secondary | ICD-10-CM

## 2013-02-23 DIAGNOSIS — E0590 Thyrotoxicosis, unspecified without thyrotoxic crisis or storm: Secondary | ICD-10-CM | POA: Diagnosis not present

## 2013-02-24 ENCOUNTER — Encounter (HOSPITAL_COMMUNITY)
Admission: RE | Admit: 2013-02-24 | Discharge: 2013-02-24 | Disposition: A | Discharge status: Home / Self Care | Payer: Medicare Other | Source: Ambulatory Visit | Attending: Endocrinology | Admitting: Endocrinology

## 2013-02-24 ENCOUNTER — Encounter (HOSPITAL_COMMUNITY): Payer: Self-pay

## 2013-02-24 ENCOUNTER — Other Ambulatory Visit: Payer: Self-pay

## 2013-02-24 DIAGNOSIS — E0590 Thyrotoxicosis, unspecified without thyrotoxic crisis or storm: Secondary | ICD-10-CM

## 2013-02-24 MED ORDER — SODIUM IODIDE I 131 CAPSULE
16.0000 | Freq: Once | INTRAVENOUS | Status: AC | PRN
  Administered 2013-02-23: 16 via ORAL

## 2013-02-24 MED ORDER — SODIUM PERTECHNETATE TC 99M INJECTION
11.0000 | Freq: Once | INTRAVENOUS | Status: AC | PRN
  Administered 2013-02-23 – 2013-02-24 (×2): 11 via INTRAVENOUS

## 2013-02-26 ENCOUNTER — Other Ambulatory Visit (HOSPITAL_COMMUNITY): Payer: Self-pay | Admitting: Cardiovascular Disease

## 2013-03-08 ENCOUNTER — Other Ambulatory Visit: Payer: Self-pay | Admitting: Endocrinology

## 2013-03-08 DIAGNOSIS — E0590 Thyrotoxicosis, unspecified without thyrotoxic crisis or storm: Secondary | ICD-10-CM

## 2013-03-09 ENCOUNTER — Ambulatory Visit
Admission: RE | Admit: 2013-03-09 | Discharge: 2013-03-09 | Disposition: A | Discharge status: Home / Self Care | Payer: Medicare Other | Source: Ambulatory Visit | Attending: Endocrinology | Admitting: Endocrinology

## 2013-03-09 DIAGNOSIS — E0590 Thyrotoxicosis, unspecified without thyrotoxic crisis or storm: Secondary | ICD-10-CM

## 2013-03-09 DIAGNOSIS — E0789 Other specified disorders of thyroid: Secondary | ICD-10-CM | POA: Diagnosis not present

## 2013-03-11 ENCOUNTER — Other Ambulatory Visit (HOSPITAL_COMMUNITY): Payer: Self-pay | Admitting: Endocrinology

## 2013-03-11 DIAGNOSIS — E0590 Thyrotoxicosis, unspecified without thyrotoxic crisis or storm: Secondary | ICD-10-CM

## 2013-03-18 ENCOUNTER — Encounter (HOSPITAL_COMMUNITY)
Admission: RE | Admit: 2013-03-18 | Discharge: 2013-03-18 | Disposition: A | Discharge status: Home / Self Care | Payer: Medicare Other | Source: Ambulatory Visit | Attending: Endocrinology | Admitting: Endocrinology

## 2013-03-18 DIAGNOSIS — E0591 Thyrotoxicosis, unspecified with thyrotoxic crisis or storm: Secondary | ICD-10-CM | POA: Diagnosis not present

## 2013-03-18 DIAGNOSIS — E0590 Thyrotoxicosis, unspecified without thyrotoxic crisis or storm: Secondary | ICD-10-CM

## 2013-03-18 MED ORDER — SODIUM IODIDE I 131 CAPSULE
32.1000 | Freq: Once | INTRAVENOUS | Status: AC | PRN
  Administered 2013-03-18: 32.1 via ORAL

## 2013-03-23 DIAGNOSIS — E0590 Thyrotoxicosis, unspecified without thyrotoxic crisis or storm: Secondary | ICD-10-CM | POA: Diagnosis not present

## 2013-03-23 DIAGNOSIS — R7301 Impaired fasting glucose: Secondary | ICD-10-CM | POA: Diagnosis not present

## 2013-04-09 DIAGNOSIS — Z23 Encounter for immunization: Secondary | ICD-10-CM | POA: Diagnosis not present

## 2013-04-16 ENCOUNTER — Encounter (HOSPITAL_COMMUNITY): Payer: Self-pay | Admitting: *Deleted

## 2013-04-16 ENCOUNTER — Other Ambulatory Visit (HOSPITAL_COMMUNITY): Payer: Self-pay | Admitting: Cardiovascular Disease

## 2013-04-16 DIAGNOSIS — I739 Peripheral vascular disease, unspecified: Secondary | ICD-10-CM

## 2013-05-07 ENCOUNTER — Ambulatory Visit (HOSPITAL_COMMUNITY)
Admission: RE | Admit: 2013-05-07 | Discharge: 2013-05-07 | Disposition: A | Discharge status: Home / Self Care | Payer: Medicare Other | Source: Ambulatory Visit | Attending: Cardiovascular Disease | Admitting: Cardiovascular Disease

## 2013-05-07 DIAGNOSIS — I739 Peripheral vascular disease, unspecified: Secondary | ICD-10-CM

## 2013-05-07 NOTE — Progress Notes (Signed)
Lower Extremity Arterial Duplex Completed. °Brianna L Mazza,RVT °

## 2013-05-14 DIAGNOSIS — H52209 Unspecified astigmatism, unspecified eye: Secondary | ICD-10-CM | POA: Diagnosis not present

## 2013-05-14 DIAGNOSIS — H5200 Hypermetropia, unspecified eye: Secondary | ICD-10-CM | POA: Diagnosis not present

## 2013-05-14 DIAGNOSIS — H524 Presbyopia: Secondary | ICD-10-CM | POA: Diagnosis not present

## 2013-05-14 DIAGNOSIS — H259 Unspecified age-related cataract: Secondary | ICD-10-CM | POA: Diagnosis not present

## 2013-05-18 ENCOUNTER — Encounter: Payer: Self-pay | Admitting: *Deleted

## 2013-05-24 ENCOUNTER — Encounter: Payer: Self-pay | Admitting: *Deleted

## 2013-05-25 ENCOUNTER — Encounter: Payer: Self-pay | Admitting: Cardiovascular Disease

## 2013-05-25 DIAGNOSIS — E0590 Thyrotoxicosis, unspecified without thyrotoxic crisis or storm: Secondary | ICD-10-CM | POA: Diagnosis not present

## 2013-05-26 ENCOUNTER — Encounter: Payer: Self-pay | Admitting: Cardiovascular Disease

## 2013-05-26 ENCOUNTER — Ambulatory Visit (INDEPENDENT_AMBULATORY_CARE_PROVIDER_SITE_OTHER): Payer: Medicare Other | Admitting: Cardiovascular Disease

## 2013-05-26 VITALS — BP 142/66 | HR 86 | Ht 60.25 in | Wt 96.0 lb

## 2013-05-26 DIAGNOSIS — I701 Atherosclerosis of renal artery: Secondary | ICD-10-CM | POA: Insufficient documentation

## 2013-05-26 DIAGNOSIS — F17200 Nicotine dependence, unspecified, uncomplicated: Secondary | ICD-10-CM

## 2013-05-26 DIAGNOSIS — E785 Hyperlipidemia, unspecified: Secondary | ICD-10-CM | POA: Insufficient documentation

## 2013-05-26 DIAGNOSIS — I6529 Occlusion and stenosis of unspecified carotid artery: Secondary | ICD-10-CM | POA: Diagnosis not present

## 2013-05-26 DIAGNOSIS — Z720 Tobacco use: Secondary | ICD-10-CM | POA: Insufficient documentation

## 2013-05-26 DIAGNOSIS — I739 Peripheral vascular disease, unspecified: Secondary | ICD-10-CM | POA: Diagnosis not present

## 2013-05-26 DIAGNOSIS — I10 Essential (primary) hypertension: Secondary | ICD-10-CM | POA: Diagnosis not present

## 2013-05-26 NOTE — Assessment & Plan Note (Signed)
Controlled on current medications. She may have also had a component of renal vascular hypertension will continue to follow her renal Doppler studies status post left renal artery stenting.

## 2013-05-26 NOTE — Assessment & Plan Note (Signed)
Angiography November 2011  did show moderate iliac and SFA disease. Surveillance Dopplers which showed progression of disease in her right external iliac artery though she really denies claudication. We'll continue to follow this

## 2013-05-26 NOTE — Patient Instructions (Addendum)
Your physician has requested that you have a renal artery duplex. During this test, an ultrasound is used to evaluate blood flow to the kidneys. Allow one hour for this exam. Do not eat after midnight the day before and avoid carbonated beverages. Take your medications as you usually do.  Please schedule this test for NOW & then we will repeat this test in 6 months.   Your physician has requested that you have a carotid duplex. This test is an ultrasound of the carotid arteries in your neck. It looks at blood flow through these arteries that supply the brain with blood. Allow one hour for this exam. There are no restrictions or special instructions. Please have this done in 6 months.   Your physician has requested that you have a lower extremity arterial exercise duplex. During this test, exercise and ultrasound are used to evaluate arterial blood flow in the legs. Allow one hour for this exam. There are no restrictions or special instructions. Please have this done in 6 months.   Your physician wants you to follow-up in: 6 months with Kendra Watts & 1 year with Kendra Watts. You will receive a reminder letter in the mail two months in advance. If you don't receive a letter, please call our office to schedule the follow-up appointment.

## 2013-05-26 NOTE — Assessment & Plan Note (Signed)
Status post angiography of her renal arteries and lower extremities 06/08/10 revealing high-grade. We will recheck a renal Doppler studies

## 2013-05-26 NOTE — Assessment & Plan Note (Signed)
Status post angiography of her carotid arteries by myself 02/18/12 revealing bilateral carotid artery disease right much greater than left. She ultimately had elective right carotid endarterectomy performed by Dr. Durene Cal 04/02/12. 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

## 2013-05-26 NOTE — Assessment & Plan Note (Signed)
On statin therapy followed by her PCP 

## 2013-05-26 NOTE — Progress Notes (Signed)
05/26/2013 Kendra Watts [facility]   13-Mar-1946  [id number]  Primary Physician Kendra Fick, MD Primary Cardiologist: Kendra Gess MD Kendra Watts   HPI:  The patient is a delightful 67 year old thin-appearing divorced Caucasian female with no children who I last saw 3 months ago. 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 She has a history of continued tobacco abuse at 1 pack per day, hypertension and hyperlipidemia with a strong family history for heart disease. Her mother had her first MI at age 61 and died at age 59. I have angiogrammed her revealing a tight right internal carotid artery stenosis, as well as a right subclavian artery stenosis. 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 I stented her left renal artery and she has moderate bilateral iliac disease but really denies claudication.She did had elective right carotid endarterectomy performed by Dr. Myra Watts April 02, 2012.since I saw her a year ago she's been remarkably stable. Carotid Dopplers performed in December showed a widely patent right carotid endarterectomy site. Her grocery Dopplers to show progression of disease in her right external iliac artery additionally denies claudication. She does continue to smoke. She denies chest pain or shortness of breath.    Current Outpatient Prescriptions  Medication Sig Dispense Refill  . amLODipine-benazepril (LOTREL) 5-20 MG per capsule Take 2 capsules by mouth daily.       Marland Kitchen aspirin 81 MG tablet Take 81 mg by mouth daily.      . clopidogrel (PLAVIX) 75 MG tablet TAKE 1 TABLET DAILY  90 tablet  2  . doxazosin (CARDURA) 8 MG tablet Take 8 mg by mouth every morning.        . ibandronate (BONIVA) 150 MG tablet Take 150 mg by mouth every 30 (thirty) days.       . Multiple Vitamins-Minerals (MULTIVITAMIN WITH MINERALS) tablet Take 1 tablet by mouth daily.        . rosuvastatin (CRESTOR) 10 MG tablet Take 10 mg by mouth daily.         No current  facility-administered medications for this visit.    No Known Allergies  History   Social History  . Marital Status: Single    Spouse Name: N/A    Number of Children: 0  . Years of Education: N/A   Occupational History  . HR administrator    Social History Main Topics  . Smoking status: Current Every Day Smoker -- 1.00 packs/day for 40 years    Types: Cigarettes  . Smokeless tobacco: Never Used     Comment: has a prescription for Chantix and states she will get it filled and has a desire to stop smoking occ alcohol  . Alcohol Use: Yes     Comment: occassional  . Drug Use: No  . Sexual Activity: Not on file   Other Topics Concern  . Not on file   Social History Narrative  . No narrative on file     Review of Systems: General: negative for chills, fever, night sweats or weight changes.  Cardiovascular: negative for chest pain, dyspnea on exertion, edema, orthopnea, palpitations, paroxysmal nocturnal dyspnea or shortness of breath Dermatological: negative for rash Respiratory: negative for cough or wheezing Urologic: negative for hematuria Abdominal: negative for nausea, vomiting, diarrhea, bright red blood per rectum, melena, or hematemesis Neurologic: negative for visual changes, syncope, or dizziness All other systems reviewed and are otherwise negative except as noted above.    Blood pressure 142/66, pulse 86, height 5'  0.25" (1.53 m), weight 96 lb (43.545 kg).  General appearance: alert and no distress Neck: no adenopathy, no JVD, supple, symmetrical, trachea midline, thyroid not enlarged, symmetric, no tenderness/mass/nodules and bbilateral carotid bruits Lungs: clear to auscultation bilaterally Heart: regular rate and rhythm, S1, S2 normal, no murmur, click, rub or gallop Extremities: extremities normal, atraumatic, no cyanosis or edema and 2+ femorals with bilateral bruits  EKG normal sinus rhythm 86 without ST or T wave changes  ASSESSMENT AND PLAN:    Occlusion and stenosis of carotid artery without mention of cerebral infarction Status post angiography of her carotid arteries by myself 02/18/12 revealing bilateral carotid artery disease right much greater than left. She ultimately had elective right carotid endarterectomy performed by Dr. Durene Watts 04/02/12. 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 We will recheck a renal Doppler studies  Peripheral arterial disease Angiography November 2011  did show moderate iliac and SFA disease. Surveillance Dopplers which showed progression of disease in her right external iliac artery though she really denies claudication. We'll continue to follow this  Essential hypertension Controlled on current medications. She may have also had a component of renal vascular hypertension will continue to follow her renal Doppler studies status post left renal artery stenting.  Hyperlipidemia On statin therapy followed by her PCP      Kendra Gess MD [facility], [facility] 05/26/2013 9:59 AM

## 2013-05-27 ENCOUNTER — Other Ambulatory Visit: Payer: Self-pay

## 2013-06-09 DIAGNOSIS — Z01419 Encounter for gynecological examination (general) (routine) without abnormal findings: Secondary | ICD-10-CM | POA: Diagnosis not present

## 2013-06-09 DIAGNOSIS — Z1231 Encounter for screening mammogram for malignant neoplasm of breast: Secondary | ICD-10-CM | POA: Diagnosis not present

## 2013-06-09 DIAGNOSIS — Z130 Encounter for screening for diseases of the blood and blood-forming organs and certain disorders involving the immune mechanism: Secondary | ICD-10-CM | POA: Diagnosis not present

## 2013-06-10 ENCOUNTER — Ambulatory Visit (HOSPITAL_COMMUNITY)
Admission: RE | Admit: 2013-06-10 | Discharge: 2013-06-10 | Disposition: A | Discharge status: Home / Self Care | Payer: Medicare Other | Source: Ambulatory Visit | Attending: Cardiovascular Disease | Admitting: Cardiovascular Disease

## 2013-06-10 DIAGNOSIS — I10 Essential (primary) hypertension: Secondary | ICD-10-CM

## 2013-06-10 DIAGNOSIS — I701 Atherosclerosis of renal artery: Secondary | ICD-10-CM | POA: Insufficient documentation

## 2013-06-10 NOTE — Progress Notes (Signed)
Renal Duplex Completed. Kenadee Gates, BS, RDMS, RVT  

## 2013-07-01 ENCOUNTER — Encounter: Payer: Self-pay | Admitting: Cardiology

## 2013-07-01 ENCOUNTER — Ambulatory Visit (INDEPENDENT_AMBULATORY_CARE_PROVIDER_SITE_OTHER): Payer: Medicare Other | Admitting: Cardiology

## 2013-07-01 VITALS — BP 131/67 | HR 94 | Ht 60.0 in | Wt 97.6 lb

## 2013-07-01 DIAGNOSIS — R200 Anesthesia of skin: Secondary | ICD-10-CM

## 2013-07-01 DIAGNOSIS — F17200 Nicotine dependence, unspecified, uncomplicated: Secondary | ICD-10-CM

## 2013-07-01 DIAGNOSIS — R209 Unspecified disturbances of skin sensation: Secondary | ICD-10-CM

## 2013-07-01 DIAGNOSIS — Z720 Tobacco use: Secondary | ICD-10-CM

## 2013-07-01 DIAGNOSIS — I6529 Occlusion and stenosis of unspecified carotid artery: Secondary | ICD-10-CM | POA: Diagnosis not present

## 2013-07-01 DIAGNOSIS — I701 Atherosclerosis of renal artery: Secondary | ICD-10-CM | POA: Diagnosis not present

## 2013-07-01 DIAGNOSIS — R0989 Other specified symptoms and signs involving the circulatory and respiratory systems: Secondary | ICD-10-CM | POA: Diagnosis not present

## 2013-07-01 LAB — BASIC METABOLIC PANEL
BUN: 16 mg/dL (ref 6–23)
CO2: 30 mEq/L (ref 19–32)
Calcium: 10.8 mg/dL — ABNORMAL HIGH (ref 8.4–10.5)
Chloride: 101 mEq/L (ref 96–112)
Creat: 1.24 mg/dL — ABNORMAL HIGH (ref 0.50–1.10)
Glucose, Bld: 72 mg/dL (ref 70–99)
Potassium: 4.4 mEq/L (ref 3.5–5.3)
Sodium: 137 mEq/L (ref 135–145)

## 2013-07-01 NOTE — Assessment & Plan Note (Signed)
Pt continues to smoke 1.5 ppd. Long discussion was held on smoking cessation and the health benefits of quitting. Pt not motivated to quit at this time.

## 2013-07-01 NOTE — Patient Instructions (Signed)
Get lab work done at lab or your choice.  Get carotid ultrasound done. Our office will call you with results. Follow up with Dr. Horald Pollen for thyroid.

## 2013-07-01 NOTE — Progress Notes (Signed)
Patient ID: Kendra Watts, female   DOB: 1946-03-28, 67 y.o.   MRN: [id number] 07/01/2013 Kendra Watts [facility]   [birth year]  [id number]  Primary Physicia Kendra Fick, MD Primary Cardiologist: Dr. Allyson Watts  HPI:  The patient is a 67 y/o female, followed by Dr. Allyson Watts. She has a history of continued tobacco abuse at 1 1/2 pack per day, hypertension and hyperlipidemia with a strong family history for heart disease. Her mother had her first MI at age 42 and died at age 18. Dr. Allyson Watts angiogrammed her revealing a tight right internal carotid artery stenosis, as well as a right subclavian artery stenosis. 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 Dr. Allyson Watts also stented her left renal artery and she has moderate bilateral iliac disease but really denies claudication.She did had elective right carotid endarterectomy performed by Dr. Myra Watts April 02, 2012. Carotid Dopplers performed in December 2013 showed a widely patent right carotid endarterectomy site.   She presents to clinic today mainly for evaluation of intermittent bilateral neck numbness, R>L. This has been occuring over the last several months, but has worsened over the last week. She denies any other signs/symptoms concerning for TIA/stroke, including weakness, confusion, visual changes, slurred speech, syncope/near syncope. No CP or SOB. No recent trauma or injury to the neck or upper extremities. She is concerned that it may be related to her carotid artery disease. She also states that she recently underwent iodine therapy for hyperthyroid and is now, subsequently, hypothyroid.  She is currently w/o symptoms while in the office. She continues to smoke 1.5 ppd. She reports daily compliance with ASA, Plavix and Crestor.   The patient also states that she was notified of the findings of her recent bilateral renal ultrasound study and is concerned because the left kidney was noticeably more smaller, compared to prior  study.    Current Outpatient Prescriptions  Medication Sig Dispense Refill  . amLODipine-benazepril (LOTREL) 5-20 MG per capsule Take 2 capsules by mouth daily.       Marland Kitchen aspirin 81 MG tablet Take 162 mg by mouth daily.       . clopidogrel (PLAVIX) 75 MG tablet TAKE 1 TABLET DAILY  90 tablet  2  . doxazosin (CARDURA) 8 MG tablet Take 8 mg by mouth every morning.        . ibandronate (BONIVA) 150 MG tablet Take 150 mg by mouth every 30 (thirty) days.       Marland Kitchen levothyroxine (SYNTHROID, LEVOTHROID) 50 MCG tablet Take 1 tablet by mouth daily.      . Multiple Vitamins-Minerals (MULTIVITAMIN WITH MINERALS) tablet Take 1 tablet by mouth daily.        . rosuvastatin (CRESTOR) 10 MG tablet Take 10 mg by mouth daily.         No current facility-administered medications for this visit.    No Known Allergies  History   Social History  . Marital Status: Single    Spouse Name: N/A    Number of Children: 0  . Years of Education: N/A   Occupational History  . HR administrator    Social History Main Topics  . Smoking status: Current Every Day Smoker -- 1.00 packs/day for 40 years    Types: Cigarettes  . Smokeless tobacco: Never Used     Comment: has a prescription for Chantix and states she will get it filled and has a desire to stop smoking occ alcohol  . Alcohol Use: Yes     Comment: occassional  .  Drug Use: No  . Sexual Activity: Not on file   Other Topics Concern  . Not on file   Social History Narrative  . No narrative on file     Review of Systems: General: negative for chills, fever, night sweats or weight changes.  Cardiovascular: negative for chest pain, dyspnea on exertion, edema, orthopnea, palpitations, paroxysmal nocturnal dyspnea or shortness of breath Dermatological: negative for rash Respiratory: negative for cough or wheezing Urologic: negative for hematuria Abdominal: negative for nausea, vomiting, diarrhea, bright red blood per rectum, melena, or  hematemesis Neurologic: negative for visual changes, syncope, or dizziness All other systems reviewed and are otherwise negative except as noted above.    Blood pressure 131/67, pulse 94, height 5' (1.524 m), weight 97 lb 9.6 oz (44.271 kg).  General appearance: alert, cooperative and mild distress Neck: no adenopathy, no JVD, supple, symmetrical, trachea midline, thyroid not enlarged, symmetric, no tenderness/mass/nodules and + bilateral carotid bruits Lungs: clear to auscultation bilaterally Heart: regular rate and rhythm, S1, S2 normal, no murmur, click, rub or gallop Abdomen: soft, non-tender; bowel sounds normal; no masses,  no organomegaly Extremities: No upper extremity weakness, normal tone. No LEE Pulses: 2+ and symmetric Skin: warm and dry Neurologic: Grossly normal    ASSESSMENT AND PLAN:   Occlusion and stenosis of carotid artery without mention of cerebral infarction S/p Rt carotid endarterectomy Sep. 2013. Now with recent intermittent bilateral neck numbness, R>L.   No other signs/symptoms concerning for TIA/stroke. + bilateral carotid bruits. She continues to smoke 1 1/2 ppd. Not ready to quit. Reports daily compliance with ASA, Plavix and Crestor. Will re-evaluate with bilateral carotid dopplers.    Tobacco abuse Pt continues to smoke 1.5 ppd. Long discussion was held on smoking cessation and the health benefits of quitting. Pt not motivated to quit at this time.   Atherosclerotic renal artery stenosis, bilateral Recent bilateral renal ultrasound 06/10/13 demonstrated the right renal artery to be equal to or less than 60% diameter reduction by velocity. The right kidney was normal. The left renal artery appeared open and patent w/o evidence of hemodynamically significant stenosis. The left kidney however, was shrunken in size when compared to prior study w/ no cortical flow seen and abnormal low resistance flow noted within the kidney. Pt showed concern of change in left  kidney and is worried about the status of renal functioning. Will order a BMP to assess SCr. She had normal renal function a year ago with a SCr of 0.69.    PLAN  I have ordered  f/u bilateral carotid dopplers to reassess status of carotid arteries. ? If recent bilateral neck numbness is related to carotid artery disease. This may also be due to recent thyroid therapies for hyperthyroidism. Will review study results once completed. I have also ordered a BMP to assess renal function, in light of recent renal ultrasound findings. Will have patient follow-up if needed.   SIMMONS, BRITTAINYPA-C 07/01/2013 4:06 PM

## 2013-07-01 NOTE — Assessment & Plan Note (Signed)
Recent bilateral renal ultrasound 06/10/13 demonstrated the right renal artery to be equal to or less than 60% diameter reduction by velocity. The right kidney was normal. The left renal artery appeared open and patent w/o evidence of hemodynamically significant stenosis. The left kidney however, was shrunken in size when compared to prior study w/ no cortical flow seen and abnormal low resistance flow noted within the kidney. Pt showed concern of change in left kidney and is worried about the status of renal functioning. Will order a BMP to assess SCr. She had normal renal function a year ago with a SCr of 0.69.

## 2013-07-01 NOTE — Assessment & Plan Note (Signed)
S/p Rt carotid endarterectomy Sep. 2013. Now with recent intermittent bilateral neck numbness, R>L.   No other signs/symptoms concerning for TIA/stroke. + bilateral carotid bruits. She continues to smoke 1 1/2 ppd. Not ready to quit. Reports daily compliance with ASA, Plavix and Crestor. Will re-evaluate with bilateral carotid dopplers.

## 2013-07-06 ENCOUNTER — Ambulatory Visit (HOSPITAL_COMMUNITY)
Admission: RE | Admit: 2013-07-06 | Discharge: 2013-07-06 | Disposition: A | Discharge status: Home / Self Care | Payer: Medicare Other | Source: Ambulatory Visit | Attending: Cardiovascular Disease | Admitting: Cardiovascular Disease

## 2013-07-06 DIAGNOSIS — R200 Anesthesia of skin: Secondary | ICD-10-CM

## 2013-07-06 DIAGNOSIS — R0989 Other specified symptoms and signs involving the circulatory and respiratory systems: Secondary | ICD-10-CM | POA: Diagnosis not present

## 2013-07-06 NOTE — Progress Notes (Signed)
Carotid Duplex Completed. °Brianna L Mazza,RVT °

## 2013-07-19 ENCOUNTER — Telehealth: Payer: Self-pay | Admitting: *Deleted

## 2013-07-19 NOTE — Telephone Encounter (Signed)
Message copied by Marella Bile on Mon Jul 19, 2013  8:00 PM ------      Message from: Runell Gess      Created: Mon Jul 12, 2013  3:39 PM       No change from prior study. Repeat in 12 months. ------

## 2013-07-19 NOTE — Telephone Encounter (Signed)
Order was placed for repeat carotid dopplers in May 2015 per Dr Allyson Sabal 05/26/13.

## 2013-07-27 DIAGNOSIS — I10 Essential (primary) hypertension: Secondary | ICD-10-CM | POA: Diagnosis not present

## 2013-07-27 DIAGNOSIS — E782 Mixed hyperlipidemia: Secondary | ICD-10-CM | POA: Diagnosis not present

## 2013-07-27 DIAGNOSIS — E0590 Thyrotoxicosis, unspecified without thyrotoxic crisis or storm: Secondary | ICD-10-CM | POA: Diagnosis not present

## 2013-07-28 DIAGNOSIS — E890 Postprocedural hypothyroidism: Secondary | ICD-10-CM | POA: Diagnosis not present

## 2013-07-28 DIAGNOSIS — R7301 Impaired fasting glucose: Secondary | ICD-10-CM | POA: Diagnosis not present

## 2013-08-03 DIAGNOSIS — E782 Mixed hyperlipidemia: Secondary | ICD-10-CM | POA: Diagnosis not present

## 2013-08-03 DIAGNOSIS — I10 Essential (primary) hypertension: Secondary | ICD-10-CM | POA: Diagnosis not present

## 2013-08-03 DIAGNOSIS — E890 Postprocedural hypothyroidism: Secondary | ICD-10-CM | POA: Diagnosis not present

## 2013-08-03 DIAGNOSIS — I739 Peripheral vascular disease, unspecified: Secondary | ICD-10-CM | POA: Diagnosis not present

## 2013-09-10 ENCOUNTER — Ambulatory Visit (INDEPENDENT_AMBULATORY_CARE_PROVIDER_SITE_OTHER): Payer: Medicare Other | Admitting: Gastroenterology

## 2013-09-10 ENCOUNTER — Encounter: Payer: Self-pay | Admitting: Gastroenterology

## 2013-09-10 VITALS — BP 140/60 | HR 100 | Ht 59.75 in | Wt 96.0 lb

## 2013-09-10 DIAGNOSIS — R0789 Other chest pain: Secondary | ICD-10-CM

## 2013-09-10 DIAGNOSIS — R071 Chest pain on breathing: Secondary | ICD-10-CM | POA: Diagnosis not present

## 2013-09-10 DIAGNOSIS — R1031 Right lower quadrant pain: Secondary | ICD-10-CM

## 2013-09-10 NOTE — Progress Notes (Signed)
Review of pertinent gastrointestinal problems:  1. routine risk for colon cancer. Colonoscopy 2001 was normal. Colonoscopy by Dr. Ardis Hughs 2012, may found a small hyperplastic polyp. Otherwise normal. Recall colonoscopy at 10 year interval.  2. IDA: June 2013 persistently Hemoccult negative, celiac sprue workup was negative by blood test. Her hemoglobin was 8.7, her MCV 64.1. Iron level 16, ferritin 5.  02/2012 given IDA, CT scan and CXR done to check for occult malig; CT suggested soft tissue mass in pancreas however MRI followup 02/2012 cleared the pancreas. CXR was normal.  HPI: This is a   very pleasant Kendra Watts who is here to discuss 2 different pains.  For a while, she has had a burning sensation in her RLQ. Bending over can cause discomfort in RUQ.  Only happens when she is bending over, has been going on for years. The lower pain can occur anytime, she will notice burning intermittently but at least once daily. Will last just a couple minutes at a time.  Is not related to BMs or eating.  She has had a couple shots in left groin.   Past Medical History  Diagnosis Date  . Hypertension   . Hyperlipidemia   . Blockage of kidney vein   . Anemia   . Heart murmur     hx  . Peripheral vascular disease     L renal artery stent, R carotid endarterectomy (2013)  . Smoker   . Family history of heart disease   . Aortic arch anomaly     arteria lusoria   . History of nuclear stress test 01/2012    lexiscan; normal pattern of perfusion; non-diagnostic for ischemia; low risk     Past Surgical History  Procedure Laterality Date  . Arch aortogram    . Subclavian angiogram Left   . Renal angiogram Left 06/08/2010    renal artery stent -  5x12 Genesis on Aviator balloon stent (Dr. Adora Fridge)  . Appendectomy    . Partial hysterectomy    . Tonsillectomy    . Endarterectomy  04/02/2012    Procedure: ENDARTERECTOMY CAROTID;  Surgeon: Serafina Mitchell, MD;  Location: [facility] OR;  Service:  Vascular;  Laterality: Right;  . Cartoid doppler  07/2012    R subclavian - 0-49% diameter reduction; R vertebral with abnormal retrograde flow; R bulb w/50-68% diameter reduction; R ICA endarterectomy 0-49% diameter reduction; L CCA w/mod amount of fibrous plaque; L bulb/prox ICA 0-49% diameter reduction   . Renal artery doppler  12/2011    abd aorta with mod amount of atherosclerosis; SMA w/prox vessel narrowing >50% diameter reduction; R renal 1-59% diameter reduction; L renal artery stent with normal patency; R & L kidneys normal   . Lower extremity arterial doppler  12/2011    ABIS 0.91; occlusive disease of R & L post tibial arteries     Current Outpatient Prescriptions  Medication Sig Dispense Refill  . amLODipine-benazepril (LOTREL) 5-20 MG per capsule Take 2 capsules by mouth daily.       Marland Kitchen aspirin 81 MG tablet Take 162 mg by mouth daily.       . clopidogrel (PLAVIX) 75 MG tablet TAKE 1 TABLET DAILY  90 tablet  2  . doxazosin (CARDURA) 8 MG tablet Take 8 mg by mouth every morning.        . ibandronate (BONIVA) 150 MG tablet Take 150 mg by mouth every 30 (thirty) days.       . Multiple Vitamins-Minerals (MULTIVITAMIN WITH MINERALS)  tablet Take 1 tablet by mouth daily.        . rosuvastatin (CRESTOR) 10 MG tablet Take 10 mg by mouth daily.        Marland Kitchen SYNTHROID 75 MCG tablet Take 75 mcg by mouth daily before breakfast.        No current facility-administered medications for this visit.    Allergies as of 09/10/2013  . (No Known Allergies)    Family History  Problem Relation Age of Onset  . Heart disease Maternal Grandmother   . Heart disease Mother     MI @ 25, died at 78  . Cancer Father     History   Social History  . Marital Status: Single    Spouse Name: N/A    Number of Children: 0  . Years of Education: N/A   Occupational History  . HR administrator    Social History Main Topics  . Smoking status: Current Every Day Smoker -- 1.00 packs/day for Kendra years    Types:  Cigarettes  . Smokeless tobacco: Never Used     Comment: has a prescription for Chantix and states she will get it filled and has a desire to stop smoking occ alcohol  . Alcohol Use: Yes     Comment: occassional  . Drug Use: No  . Sexual Activity: Not on file   Other Topics Concern  . Not on file   Social History Narrative  . No narrative on file      Physical Exam: Ht 4' 11.75" (1.518 m)  Wt 96 lb (43.545 kg)  BMI 18.90 kg/m2 Constitutional: generally well-appearing Psychiatric: alert and oriented x3 Abdomen: soft, nontender, nondistended, no obvious ascites, no peritoneal signs, normal bowel sounds     Assessment and plan: 68 y.o. female with right chest positional pain, right groin burning  First the burning in her right groin occurs once to twice a day and only lasts for a few minutes. It has nothing to do with her bowels or eating or positions. Really doesn't seem GI related. She is not tender to examination. Perhaps is related to recent right groin access angiogram 6, maybe I nerve was slightly irritated. The other pain is really in her right chest, just below her right breast. This does not all seem GI related. It is positional. I recommended she discuss both of these discomforts with her primary care physician who may want to consider imaging studies or simply reassure her that nothing serious is going on.

## 2013-09-10 NOTE — Patient Instructions (Addendum)
One of your biggest health concerns is your smoking.  This increases your risk for most cancers and serious cardiovascular diseases such as strokes, heart attacks.  You should try your best to stop.  If you need assistance, please contact your PCP or Smoking Cessation Class at [facility] [phone]) or Coffee (1-800-QUIT-NOW). Your right chest pains and right groin pains do not seem related to GI tract.  You should see your PCP about the pains.

## 2013-09-15 DIAGNOSIS — E890 Postprocedural hypothyroidism: Secondary | ICD-10-CM | POA: Diagnosis not present

## 2013-09-20 ENCOUNTER — Other Ambulatory Visit: Payer: Self-pay | Admitting: Internal Medicine

## 2013-09-20 DIAGNOSIS — F17200 Nicotine dependence, unspecified, uncomplicated: Secondary | ICD-10-CM | POA: Diagnosis not present

## 2013-09-20 DIAGNOSIS — R109 Unspecified abdominal pain: Secondary | ICD-10-CM | POA: Diagnosis not present

## 2013-09-20 DIAGNOSIS — R079 Chest pain, unspecified: Secondary | ICD-10-CM | POA: Diagnosis not present

## 2013-09-28 ENCOUNTER — Ambulatory Visit
Admission: RE | Admit: 2013-09-28 | Discharge: 2013-09-28 | Disposition: A | Discharge status: Home / Self Care | Payer: Medicare Other | Source: Ambulatory Visit | Attending: Internal Medicine | Admitting: Internal Medicine

## 2013-09-28 ENCOUNTER — Other Ambulatory Visit: Payer: Self-pay | Admitting: Internal Medicine

## 2013-09-28 DIAGNOSIS — R109 Unspecified abdominal pain: Secondary | ICD-10-CM

## 2013-09-28 DIAGNOSIS — R1011 Right upper quadrant pain: Secondary | ICD-10-CM | POA: Diagnosis not present

## 2013-10-03 DIAGNOSIS — I10 Essential (primary) hypertension: Secondary | ICD-10-CM | POA: Diagnosis not present

## 2013-10-03 DIAGNOSIS — K120 Recurrent oral aphthae: Secondary | ICD-10-CM | POA: Diagnosis not present

## 2013-10-07 DIAGNOSIS — J029 Acute pharyngitis, unspecified: Secondary | ICD-10-CM | POA: Diagnosis not present

## 2013-10-29 DIAGNOSIS — E0590 Thyrotoxicosis, unspecified without thyrotoxic crisis or storm: Secondary | ICD-10-CM | POA: Diagnosis not present

## 2013-10-31 ENCOUNTER — Other Ambulatory Visit (HOSPITAL_COMMUNITY): Payer: Self-pay | Admitting: Cardiovascular Disease

## 2013-11-01 NOTE — Telephone Encounter (Signed)
Rx was sent to pharmacy electronically. 

## 2013-11-05 ENCOUNTER — Ambulatory Visit (HOSPITAL_COMMUNITY)
Admission: RE | Admit: 2013-11-05 | Discharge: 2013-11-05 | Disposition: A | Discharge status: Home / Self Care | Payer: Medicare Other | Source: Ambulatory Visit | Attending: Cardiovascular Disease | Admitting: Cardiovascular Disease

## 2013-11-05 ENCOUNTER — Ambulatory Visit (HOSPITAL_BASED_OUTPATIENT_CLINIC_OR_DEPARTMENT_OTHER)
Admission: RE | Admit: 2013-11-05 | Discharge: 2013-11-05 | Disposition: A | Discharge status: Home / Self Care | Payer: Medicare Other | Source: Ambulatory Visit | Attending: Cardiovascular Disease | Admitting: Cardiovascular Disease

## 2013-11-05 DIAGNOSIS — I701 Atherosclerosis of renal artery: Secondary | ICD-10-CM | POA: Insufficient documentation

## 2013-11-05 DIAGNOSIS — I739 Peripheral vascular disease, unspecified: Secondary | ICD-10-CM | POA: Diagnosis not present

## 2013-11-05 NOTE — Progress Notes (Signed)
Lower Extremity Arterial Duplex Completed. °Brianna L Mazza,RVT °

## 2013-11-05 NOTE — Progress Notes (Signed)
Renal Artery Duplex Completed. °Brianna L Mazza,RVT °

## 2013-11-11 ENCOUNTER — Ambulatory Visit (HOSPITAL_COMMUNITY)
Admission: RE | Admit: 2013-11-11 | Discharge: 2013-11-11 | Disposition: A | Discharge status: Home / Self Care | Payer: Medicare Other | Source: Ambulatory Visit | Attending: Cardiovascular Disease | Admitting: Cardiovascular Disease

## 2013-11-11 DIAGNOSIS — I739 Peripheral vascular disease, unspecified: Secondary | ICD-10-CM | POA: Insufficient documentation

## 2013-11-11 NOTE — Progress Notes (Signed)
Carotid Duplex Completed. °Brianna L Mazza,RVT °

## 2013-11-15 ENCOUNTER — Telehealth: Payer: Self-pay | Admitting: *Deleted

## 2013-11-15 DIAGNOSIS — I739 Peripheral vascular disease, unspecified: Secondary | ICD-10-CM

## 2013-11-15 NOTE — Telephone Encounter (Signed)
Order placed for repeat lower extremity arterial doppler in 6 months  

## 2013-11-15 NOTE — Telephone Encounter (Signed)
Message copied by Chauncy Lean on Mon Nov 15, 2013 12:44 PM ------      Message from: Lorretta Harp      Created: Sat Nov 13, 2013  5:53 PM       No change from prior study. Repeat in 6 months ------

## 2013-11-17 ENCOUNTER — Encounter: Payer: Self-pay | Admitting: Cardiovascular Disease

## 2013-11-17 ENCOUNTER — Ambulatory Visit (INDEPENDENT_AMBULATORY_CARE_PROVIDER_SITE_OTHER): Payer: Medicare Other | Admitting: Cardiovascular Disease

## 2013-11-17 VITALS — BP 149/64 | HR 84 | Ht 60.0 in | Wt 96.5 lb

## 2013-11-17 DIAGNOSIS — I739 Peripheral vascular disease, unspecified: Secondary | ICD-10-CM

## 2013-11-17 DIAGNOSIS — I6529 Occlusion and stenosis of unspecified carotid artery: Secondary | ICD-10-CM | POA: Diagnosis not present

## 2013-11-17 DIAGNOSIS — Z720 Tobacco use: Secondary | ICD-10-CM

## 2013-11-17 DIAGNOSIS — I10 Essential (primary) hypertension: Secondary | ICD-10-CM | POA: Diagnosis not present

## 2013-11-17 DIAGNOSIS — E785 Hyperlipidemia, unspecified: Secondary | ICD-10-CM | POA: Diagnosis not present

## 2013-11-17 DIAGNOSIS — F17200 Nicotine dependence, unspecified, uncomplicated: Secondary | ICD-10-CM

## 2013-11-17 DIAGNOSIS — I701 Atherosclerosis of renal artery: Secondary | ICD-10-CM | POA: Diagnosis not present

## 2013-11-17 NOTE — Assessment & Plan Note (Signed)
History of right carotid endarterectomy performed by Dr. Trula Slade 04/02/12. Followup Dopplers recently performed showed it to be widely patent. 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 Her right subclavian artery arises in the descending thoracic aorta beyond the takeoff of the left subclavian artery

## 2013-11-17 NOTE — Progress Notes (Signed)
11/17/2013 Kendra Watts [facility]   September 06, 1945  [id number]  Primary Physician Merrilee Seashore, MD Primary Cardiologist: Lorretta Harp MD [name]   HPI:  The patient is a delightful 67 year old thin-appearing divorced Caucasian female with no children who I last saw 3 months ago. She is retired from doing [place] at [employer]. She has a history of continued tobacco abuse at 1 pack per day, hypertension and hyperlipidemia with a strong family history for heart disease. Her mother had her first MI at age 87 and died at age 76. I have angiogrammed her revealing a tight right internal carotid artery stenosis, as well as a right subclavian artery stenosis. 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 I stented her left renal artery and she has moderate bilateral iliac disease but really denies claudication.She did had elective right carotid endarterectomy performed by Dr. Trula Slade April 02, 2012.since I saw her a year ago she's been remarkably stable. Carotid Dopplers performed /23/15 revealed a widely patent right carotid endarterectomy site and minimal left carotid disease. Renal Dopplers social progression of disease in the right renal artery. Her left kidney continues to diminish in size despite a patent stent. Lower extremity arterial  Dopplers to show progression of disease in the iliac arteries as she denies claudication.she denies chest pain or shortness of breath  Current Outpatient Prescriptions  Medication Sig Dispense Refill  . amLODipine-benazepril (LOTREL) 5-20 MG per capsule Take 2 capsules by mouth daily.       Marland Kitchen aspirin 81 MG tablet Take 162 mg by mouth daily.       . clopidogrel (PLAVIX) 75 MG tablet TAKE 1 TABLET DAILY  90 tablet  2  . DENTAGEL 1.1 % GEL dental gel Take 1 application by mouth at bedtime.      Marland Kitchen doxazosin (CARDURA) 8 MG tablet Take 8 mg by mouth every morning.        . ibandronate (BONIVA) 150 MG tablet Take 150 mg by mouth every 30  (thirty) days.       Marland Kitchen levothyroxine (SYNTHROID, LEVOTHROID) 100 MCG tablet Take 100 mcg by mouth daily before breakfast.      . Multiple Vitamins-Minerals (MULTIVITAMIN WITH MINERALS) tablet Take 1 tablet by mouth daily.        . rosuvastatin (CRESTOR) 10 MG tablet Take 10 mg by mouth daily.         No current facility-administered medications for this visit.    No Known Allergies  History   Social History  . Marital Status: Single    Spouse Name: N/A    Number of Children: 0  . Years of Education: N/A   Occupational History  . HR administrator    Social History Main Topics  . Smoking status: Current Every Day Smoker -- 1.25 packs/day for 40 years    Types: Cigarettes  . Smokeless tobacco: Never Used     Comment: has a prescription for Chantix and states she will get it filled and has a desire to stop smoking occ alcohol  . Alcohol Use: Yes     Comment: occassional  . Drug Use: No  . Sexual Activity: Not on file   Other Topics Concern  . Not on file   Social History Narrative  . No narrative on file     Review of Systems: General: negative for chills, fever, night sweats or weight changes.  Cardiovascular: negative for chest pain, dyspnea on exertion, edema, orthopnea, palpitations, paroxysmal nocturnal dyspnea or shortness  of breath Dermatological: negative for rash Respiratory: negative for cough or wheezing Urologic: negative for hematuria Abdominal: negative for nausea, vomiting, diarrhea, bright red blood per rectum, melena, or hematemesis Neurologic: negative for visual changes, syncope, or dizziness All other systems reviewed and are otherwise negative except as noted above.    Blood pressure 149/64, pulse 84, height 5' (1.524 m), weight 96 lb 8 oz (43.772 kg).  General appearance: alert and no distress Neck: no adenopathy, no JVD, supple, symmetrical, trachea midline, thyroid not enlarged, symmetric, no tenderness/mass/nodules and left carotid  bruit Lungs: clear to auscultation bilaterally Heart: regular rate and rhythm, S1, S2 normal, no murmur, click, rub or gallop Extremities: extremities normal, atraumatic, no cyanosis or edema  EKG not performed to  ASSESSMENT AND PLAN:   Atherosclerotic renal artery stenosis, bilateral Status post left renal artery PTA and stenting November 2011. At that time she had a 50% right renal artery stenosis with a 20 mm gradient. Her left kidney was approximately 9.5 cm, 2 cm smaller than her right kidney at that time. It has diminished in the ultimate dimension of the last 4 years by 2 cm now 7.5 cm. Her most recent renal Doppler suggest patency of the left renal artery stent or progression of her right renal artery stenosis.  Occlusion and stenosis of carotid artery without mention of cerebral infarction History of right carotid endarterectomy performed by Dr. Trula Slade 04/02/12. Followup Dopplers recently performed showed it to be widely patent. 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 Her right subclavian artery arises in the descending thoracic aorta beyond the takeoff of the left subclavian artery  Essential hypertension Controlled on current medications  Hyperlipidemia On statin therapy followed by her PCP  Peripheral arterial disease History of known bilateral moderate iliac disease in the 60% range. Recent lower Dopplers revealed stable ABIs with mild progression of disease in both right and left iliacs though she denies claudications. Will continue to follow this on an annual basis  Tobacco abuse Continues to smoke 1-1/2 packs per day, recalcitrant factor modification      Lorretta Harp MD [facility], [facility] 11/17/2013 9:28 AM

## 2013-11-17 NOTE — Assessment & Plan Note (Signed)
Controlled on current medications 

## 2013-11-17 NOTE — Assessment & Plan Note (Signed)
History of known bilateral moderate iliac disease in the 60% range. Recent lower Dopplers revealed stable ABIs with mild progression of disease in both right and left iliacs though she denies claudications. Will continue to follow this on an annual basis

## 2013-11-17 NOTE — Patient Instructions (Signed)
  We will see you back in follow up in October with Dr Gwenlyn Found after your renal dopplers.  Dr Gwenlyn Found has ordered renal dopplers to be done in October.

## 2013-11-17 NOTE — Assessment & Plan Note (Signed)
Status post left renal artery PTA and stenting November 2011. At that time she had a 50% right renal artery stenosis with a 20 mm gradient. Her left kidney was approximately 9.5 cm, 2 cm smaller than her right kidney at that time. It has diminished in the ultimate dimension of the last 4 years by 2 cm now 7.5 cm. Her most recent renal Doppler suggest patency of the left renal artery stent or progression of her right renal artery stenosis.

## 2013-11-17 NOTE — Assessment & Plan Note (Signed)
Continues to smoke 1-1/2 packs per day, recalcitrant factor modification

## 2013-11-17 NOTE — Assessment & Plan Note (Signed)
On statin therapy followed by her PCP 

## 2013-12-02 DIAGNOSIS — H903 Sensorineural hearing loss, bilateral: Secondary | ICD-10-CM | POA: Diagnosis not present

## 2013-12-02 DIAGNOSIS — H905 Unspecified sensorineural hearing loss: Secondary | ICD-10-CM | POA: Diagnosis not present

## 2013-12-10 DIAGNOSIS — S41109A Unspecified open wound of unspecified upper arm, initial encounter: Secondary | ICD-10-CM | POA: Diagnosis not present

## 2013-12-14 DIAGNOSIS — E039 Hypothyroidism, unspecified: Secondary | ICD-10-CM | POA: Diagnosis not present

## 2014-01-06 DIAGNOSIS — H905 Unspecified sensorineural hearing loss: Secondary | ICD-10-CM | POA: Diagnosis not present

## 2014-01-06 DIAGNOSIS — H903 Sensorineural hearing loss, bilateral: Secondary | ICD-10-CM | POA: Diagnosis not present

## 2014-01-27 DIAGNOSIS — E039 Hypothyroidism, unspecified: Secondary | ICD-10-CM | POA: Diagnosis not present

## 2014-01-27 DIAGNOSIS — R7301 Impaired fasting glucose: Secondary | ICD-10-CM | POA: Diagnosis not present

## 2014-01-27 DIAGNOSIS — E0590 Thyrotoxicosis, unspecified without thyrotoxic crisis or storm: Secondary | ICD-10-CM | POA: Diagnosis not present

## 2014-01-27 DIAGNOSIS — E890 Postprocedural hypothyroidism: Secondary | ICD-10-CM | POA: Diagnosis not present

## 2014-02-22 DIAGNOSIS — E0590 Thyrotoxicosis, unspecified without thyrotoxic crisis or storm: Secondary | ICD-10-CM | POA: Diagnosis not present

## 2014-02-22 DIAGNOSIS — Z0000 Encounter for general adult medical examination without abnormal findings: Secondary | ICD-10-CM | POA: Diagnosis not present

## 2014-02-22 DIAGNOSIS — I10 Essential (primary) hypertension: Secondary | ICD-10-CM | POA: Diagnosis not present

## 2014-02-22 DIAGNOSIS — E782 Mixed hyperlipidemia: Secondary | ICD-10-CM | POA: Diagnosis not present

## 2014-03-01 DIAGNOSIS — Z23 Encounter for immunization: Secondary | ICD-10-CM | POA: Diagnosis not present

## 2014-03-01 DIAGNOSIS — I739 Peripheral vascular disease, unspecified: Secondary | ICD-10-CM | POA: Diagnosis not present

## 2014-03-01 DIAGNOSIS — I10 Essential (primary) hypertension: Secondary | ICD-10-CM | POA: Diagnosis not present

## 2014-03-01 DIAGNOSIS — E782 Mixed hyperlipidemia: Secondary | ICD-10-CM | POA: Diagnosis not present

## 2014-03-01 DIAGNOSIS — N183 Chronic kidney disease, stage 3 unspecified: Secondary | ICD-10-CM | POA: Diagnosis not present

## 2014-04-07 DIAGNOSIS — I10 Essential (primary) hypertension: Secondary | ICD-10-CM | POA: Diagnosis not present

## 2014-04-07 DIAGNOSIS — E0590 Thyrotoxicosis, unspecified without thyrotoxic crisis or storm: Secondary | ICD-10-CM | POA: Diagnosis not present

## 2014-04-12 ENCOUNTER — Ambulatory Visit: Payer: Medicare Other | Admitting: Cardiovascular Disease

## 2014-04-12 DIAGNOSIS — E782 Mixed hyperlipidemia: Secondary | ICD-10-CM | POA: Diagnosis not present

## 2014-04-12 DIAGNOSIS — I10 Essential (primary) hypertension: Secondary | ICD-10-CM | POA: Diagnosis not present

## 2014-04-12 DIAGNOSIS — N183 Chronic kidney disease, stage 3 unspecified: Secondary | ICD-10-CM | POA: Diagnosis not present

## 2014-04-27 ENCOUNTER — Encounter: Payer: Self-pay | Admitting: Cardiovascular Disease

## 2014-04-27 ENCOUNTER — Ambulatory Visit (INDEPENDENT_AMBULATORY_CARE_PROVIDER_SITE_OTHER): Payer: Medicare Other | Admitting: Cardiovascular Disease

## 2014-04-27 VITALS — BP 140/92 | HR 105 | Ht 60.0 in | Wt 94.0 lb

## 2014-04-27 DIAGNOSIS — R200 Anesthesia of skin: Secondary | ICD-10-CM

## 2014-04-27 DIAGNOSIS — I701 Atherosclerosis of renal artery: Secondary | ICD-10-CM

## 2014-04-27 DIAGNOSIS — E785 Hyperlipidemia, unspecified: Secondary | ICD-10-CM | POA: Diagnosis not present

## 2014-04-27 DIAGNOSIS — I6529 Occlusion and stenosis of unspecified carotid artery: Secondary | ICD-10-CM | POA: Diagnosis not present

## 2014-04-27 DIAGNOSIS — I739 Peripheral vascular disease, unspecified: Secondary | ICD-10-CM

## 2014-04-27 DIAGNOSIS — I10 Essential (primary) hypertension: Secondary | ICD-10-CM | POA: Diagnosis not present

## 2014-04-27 DIAGNOSIS — Z720 Tobacco use: Secondary | ICD-10-CM

## 2014-04-27 DIAGNOSIS — R202 Paresthesia of skin: Secondary | ICD-10-CM

## 2014-04-27 NOTE — Assessment & Plan Note (Signed)
Recalcitrant to risk factor modification

## 2014-04-27 NOTE — Progress Notes (Signed)
04/27/2014 Kendra Watts [facility]   17-Jul-1946  [id number]  Primary Physician Merrilee Seashore, MD Primary Cardiologist: Lorretta Harp MD [name]   HPI:  The patient is a delightful 68 year old thin-appearing divorced Caucasian female with no children who I last saw 6 months ago. She is retired from doing [place] at [employer]. She has a history of continued tobacco abuse at 1 pack per day, hypertension and hyperlipidemia with a strong family history for heart disease. Her mother had her first MI at age 59 and died at age 49. I have angiogrammed her revealing a tight right internal carotid artery stenosis, as well as a right subclavian artery stenosis. 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 I stented her left renal artery and she has moderate bilateral iliac disease but really denies claudication.She did had elective right carotid endarterectomy performed by Dr. Trula Slade April 02, 2012.since I saw her a year ago she's been remarkably stable. Carotid Dopplers performed /23/15 revealed a widely patent right carotid endarterectomy site and minimal left carotid disease. Renal Dopplers social progression of disease in the right renal artery. Her left kidney continues to diminish in size despite a patent stent. Recent renal Dopplers performed in April show progression of disease on the right as well.Lower extremity arterial Dopplers to show progression of disease in the iliac arteries as she denies claudication.she denies chest pain or shortness of breath. 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 We will recheck a renal Doppler studies in order to determine whether or not she needs intervention.    Current Outpatient Prescriptions  Medication Sig Dispense Refill  . aspirin 81 MG tablet Take 162 mg by mouth daily.       . clopidogrel (PLAVIX) 75 MG  tablet TAKE 1 TABLET DAILY  90 tablet  2  . doxazosin (CARDURA) 8 MG tablet Take 8 mg by mouth every morning.        . ibandronate (BONIVA) 150 MG tablet Take 150 mg by mouth every 30 (thirty) days.       Marland Kitchen levothyroxine (SYNTHROID, LEVOTHROID) 88 MCG tablet Take 88 mcg by mouth daily before breakfast.      . losartan (COZAAR) 100 MG tablet Take 100 mg by mouth daily.       . Multiple Vitamins-Minerals (MULTIVITAMIN WITH MINERALS) tablet Take 1 tablet by mouth daily.        . rosuvastatin (CRESTOR) 10 MG tablet Take 10 mg by mouth daily.         No current facility-administered medications for this visit.    No Known Allergies  History   Social History  . Marital Status: Single    Spouse Name: N/A    Number of Children: 0  . Years of Education: N/A   Occupational History  . HR administrator    Social History Main Topics  . Smoking status: Current Every Day Smoker -- 1.25 packs/day for 40 years    Types: Cigarettes  . Smokeless tobacco: Never Used     Comment: has a prescription for Chantix and states she will get it filled and has a desire to stop smoking occ alcohol  . Alcohol Use: Yes     Comment: occassional  . Drug Use: No  . Sexual Activity: Not on file   Other Topics Concern  . Not on file   Social History Narrative  .  No narrative on file     Review of Systems: General: negative for chills, fever, night sweats or weight changes.  Cardiovascular: negative for chest pain, dyspnea on exertion, edema, orthopnea, palpitations, paroxysmal nocturnal dyspnea or shortness of breath Dermatological: negative for rash Respiratory: negative for cough or wheezing Urologic: negative for hematuria Abdominal: negative for nausea, vomiting, diarrhea, bright red blood per rectum, melena, or hematemesis Neurologic: negative for visual changes, syncope, or dizziness All other systems reviewed and are otherwise negative except as noted above.    Blood pressure 140/92, pulse 105,  height 5' (1.524 m), weight 94 lb (42.638 kg).  General appearance: alert and no distress Neck: no adenopathy, no JVD, supple, symmetrical, trachea midline, thyroid not enlarged, symmetric, no tenderness/mass/nodules and left carotid bruit Lungs: clear to auscultation bilaterally Heart: regular rate and rhythm, S1, S2 normal, no murmur, click, rub or gallop Extremities: extremities normal, atraumatic, no cyanosis or edema and bilateral femoral bruits and midline abdominal bruit  EKG sinus tachycardia at 105 without ST or T wave changes  ASSESSMENT AND PLAN:   Occlusion and stenosis of carotid artery without mention of cerebral infarction History of tight right internal carotid artery stenosis which I am assured by angiography 02/18/12. 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 She ultimately underwent elective right carotid endarterectomy by Dr. Mikki Santee on 04/02/12. This has remained stable and we have been following this by duplex ultrasound.  Atherosclerotic renal artery stenosis, bilateral History of left renal artery stenting remotely. During angiography 02/18/12 she had "60% in-stent restenosis within the left renal artery stent and 50% right renal artery stenosis. The left renal dimension was 7.5 mL suggesting that the kidney is nonfunctional. Recent Doppler performed in April suggest progression of disease on the right. Apparently her renal function has somewhat deteriorated as well. A concern that she may have ischemic nephropathy. I'm going to recheck a renal Doppler studies and if these show right renal artery progression she'll need a right renal artery intervention for renal preservation  Essential hypertension Controlled on current medications  Hyperlipidemia On statin therapy, followed by her PCP  Tobacco abuse Recalcitrant to risk factor modification  Right upper extremity numbness Known right subclavian artery stenosis however fairly asymptomatic. Blood pressure  should be obtained in the left arm  Peripheral arterial disease Known 50% proximal left common iliac artery stenosis. Lower extremity arterial Doppler studies performed in April suggested a moderately high-frequency signal in her left common iliac artery. ABIs of 0.93 bilaterally. She denies claudication      Lorretta Harp MD [facility], [facility] 04/27/2014 12:00 PM

## 2014-04-27 NOTE — Assessment & Plan Note (Signed)
Controlled on current medications 

## 2014-04-27 NOTE — Assessment & Plan Note (Signed)
History of tight right internal carotid artery stenosis which I am assured by angiography 02/18/12. 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 She ultimately underwent elective right carotid endarterectomy by Dr. Mikki Santee on 04/02/12. This has remained stable and we have been following this by duplex ultrasound.

## 2014-04-27 NOTE — Assessment & Plan Note (Signed)
On statin therapy, followed by her PCP 

## 2014-04-27 NOTE — Assessment & Plan Note (Signed)
History of left renal artery stenting remotely. During angiography 02/18/12 she had "60% in-stent restenosis within the left renal artery stent and 50% right renal artery stenosis. The left renal dimension was 7.5 mL suggesting that the kidney is nonfunctional. Recent Doppler performed in April suggest progression of disease on the right. Apparently her renal function has somewhat deteriorated as well. A concern that she may have ischemic nephropathy. I'm going to recheck a renal Doppler studies and if these show right renal artery progression she'll need a right renal artery intervention for renal preservation

## 2014-04-27 NOTE — Assessment & Plan Note (Signed)
Known right subclavian artery stenosis however fairly asymptomatic. Blood pressure should be obtained in the left arm

## 2014-04-27 NOTE — Patient Instructions (Signed)
  We will see you back in follow up in 6 months with Dr Gwenlyn Found.   Dr Gwenlyn Found has ordered: 1. Carotid Duplex- This test is an ultrasound of the carotid arteries in your neck. It looks at blood flow through these arteries that supply the brain with blood. Allow one hour for this exam. There are no restrictions or special instructions.  2. Lower extremity arterial doppler- During this test, ultrasound is used to evaluate arterial blood flow in the legs. Allow approximately one hour for this exam.   3. Renal artery duplex- During this test, an ultrasound is used to evaluate blood flow to the kidneys. Allow one hour for this exam. Do not eat after midnight the day before and avoid carbonated beverages. Take your medications as you usually do.

## 2014-04-27 NOTE — Assessment & Plan Note (Signed)
Known 50% proximal left common iliac artery stenosis. Lower extremity arterial Doppler studies performed in April suggested a moderately high-frequency signal in her left common iliac artery. ABIs of 0.93 bilaterally. She denies claudication

## 2014-05-19 ENCOUNTER — Ambulatory Visit (HOSPITAL_BASED_OUTPATIENT_CLINIC_OR_DEPARTMENT_OTHER)
Admission: RE | Admit: 2014-05-19 | Discharge: 2014-05-19 | Disposition: A | Discharge status: Home / Self Care | Payer: Medicare Other | Source: Ambulatory Visit | Attending: Cardiology | Admitting: Cardiology

## 2014-05-19 ENCOUNTER — Ambulatory Visit (HOSPITAL_COMMUNITY)
Admission: RE | Admit: 2014-05-19 | Discharge: 2014-05-19 | Disposition: A | Discharge status: Home / Self Care | Payer: Medicare Other | Source: Ambulatory Visit | Attending: Cardiology | Admitting: Cardiology

## 2014-05-19 DIAGNOSIS — I739 Peripheral vascular disease, unspecified: Secondary | ICD-10-CM

## 2014-05-19 DIAGNOSIS — I10 Essential (primary) hypertension: Secondary | ICD-10-CM

## 2014-05-19 NOTE — Progress Notes (Signed)
Carotid Duplex Completed. °Brianna L Mazza,RVT °

## 2014-05-19 NOTE — Progress Notes (Signed)
Renal Artery Duplex Completed. °Brianna L Mazza,RVT °

## 2014-05-19 NOTE — Progress Notes (Signed)
Lower Extremity Arterial Duplex Completed. °Brianna L Mazza,RVT °

## 2014-05-23 ENCOUNTER — Encounter: Payer: Self-pay | Admitting: Cardiovascular Disease

## 2014-05-24 ENCOUNTER — Ambulatory Visit: Payer: Medicare Other | Admitting: Cardiovascular Disease

## 2014-06-07 ENCOUNTER — Encounter: Payer: Self-pay | Admitting: Cardiovascular Disease

## 2014-06-07 ENCOUNTER — Other Ambulatory Visit: Payer: Self-pay | Admitting: *Deleted

## 2014-06-07 ENCOUNTER — Ambulatory Visit (INDEPENDENT_AMBULATORY_CARE_PROVIDER_SITE_OTHER): Payer: Medicare Other | Admitting: Cardiovascular Disease

## 2014-06-07 VITALS — BP 190/90 | HR 104 | Ht 60.0 in | Wt 95.0 lb

## 2014-06-07 DIAGNOSIS — R748 Abnormal levels of other serum enzymes: Secondary | ICD-10-CM | POA: Diagnosis not present

## 2014-06-07 DIAGNOSIS — R202 Paresthesia of skin: Secondary | ICD-10-CM

## 2014-06-07 DIAGNOSIS — Z79899 Other long term (current) drug therapy: Secondary | ICD-10-CM

## 2014-06-07 DIAGNOSIS — Z720 Tobacco use: Secondary | ICD-10-CM | POA: Diagnosis not present

## 2014-06-07 DIAGNOSIS — I6529 Occlusion and stenosis of unspecified carotid artery: Secondary | ICD-10-CM | POA: Diagnosis not present

## 2014-06-07 DIAGNOSIS — R5383 Other fatigue: Secondary | ICD-10-CM

## 2014-06-07 DIAGNOSIS — I701 Atherosclerosis of renal artery: Secondary | ICD-10-CM | POA: Diagnosis not present

## 2014-06-07 DIAGNOSIS — R200 Anesthesia of skin: Secondary | ICD-10-CM

## 2014-06-07 DIAGNOSIS — Z01812 Encounter for preprocedural laboratory examination: Secondary | ICD-10-CM | POA: Diagnosis not present

## 2014-06-07 DIAGNOSIS — D689 Coagulation defect, unspecified: Secondary | ICD-10-CM | POA: Diagnosis not present

## 2014-06-07 DIAGNOSIS — E785 Hyperlipidemia, unspecified: Secondary | ICD-10-CM

## 2014-06-07 DIAGNOSIS — R7989 Other specified abnormal findings of blood chemistry: Secondary | ICD-10-CM

## 2014-06-07 NOTE — Assessment & Plan Note (Signed)
History of carotid artery disease status post right carotid endarterectomy performed electively by Dr. Trula Slade 04/02/12.follow-up carotid Dopplers recently performed in our office 05/19/14 revealed this to be widely patent.

## 2014-06-07 NOTE — Assessment & Plan Note (Signed)
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

## 2014-06-07 NOTE — Assessment & Plan Note (Signed)
History of left renal artery stenting by myself 06/08/10 with a 5 mm x 12 mm long stent with an excellent result. At that time she had a 50% ostial right renal artery stenosis with a downward takeoff. Renal Dopplers have shown progression of her right renal artery stenosis with a renal aortic ratio that increased from 6.34 on 11/05/13-7.22 on 05/19/14. 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 She is on multiple antihypertensive medications. I'm concerned that she has renal vascular hypertension and potential impending ischemic nephropathy with progression of disease in her only functioning kidney. I plan is to perform angiography and potential intervention on her right renal artery.

## 2014-06-07 NOTE — Progress Notes (Signed)
06/07/2014 [place]   Apr 12, 1946  [id number]  Primary Physician Merrilee Seashore, MD Primary Cardiologist: Lorretta Harp MD [name]   HPI:  The patient is a delightful 68 year old thin-appearing divorced Caucasian female with no children who I last saw on 04/27/14. She is retired from doing [place] at [employer]. She has a history of continued tobacco abuse at 1 pack per day, hypertension and hyperlipidemia with a strong family history for heart disease. Her mother had her first MI at age 37 and died at age 69. I have angiogrammed her revealing a tight right internal carotid artery stenosis, as well as a right subclavian artery stenosis. 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 I stented her left renal artery and she has moderate bilateral iliac disease but really denies claudication.She did had elective right carotid endarterectomy performed by Dr. Trula Slade April 02, 2012.since I saw her a year ago she's been remarkably stable. Carotid Dopplers performed /23/15 revealed a widely patent right carotid endarterectomy site and minimal left carotid disease. Renal Dopplers social progression of disease in the right renal artery. Her left kidney continues to diminish in size despite a patent stent. Recent renal Dopplers performed in April show progression of disease on the right as well.Lower extremity arterial Dopplers to show progression of disease in the iliac arteries as she denies claudication.she denies chest pain or shortness of breath. 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 Follow-up renal Doppler studies performed on 05/19/14 revealed progression of disease in her right renal artery with a renal aortic ratio that went from 6.34-7.20. I'm concerned that she has progression of disease in her only functioning kidney. My plan is to perform  angiography and potential intervention for renal preservation and treatment of renal vascular hypertension.   Current Outpatient Prescriptions  Medication Sig Dispense Refill  . aspirin 81 MG tablet Take 162 mg by mouth daily.     . clopidogrel (PLAVIX) 75 MG tablet TAKE 1 TABLET DAILY 90 tablet 2  . doxazosin (CARDURA) 8 MG tablet Take 8 mg by mouth every morning.      . ibandronate (BONIVA) 150 MG tablet Take 150 mg by mouth every 30 (thirty) days.     Marland Kitchen levothyroxine (SYNTHROID, LEVOTHROID) 88 MCG tablet Take 88 mcg by mouth daily before breakfast.    . losartan (COZAAR) 100 MG tablet Take 100 mg by mouth daily.     . Multiple Vitamins-Minerals (MULTIVITAMIN WITH MINERALS) tablet Take 1 tablet by mouth daily.      . rosuvastatin (CRESTOR) 10 MG tablet Take 10 mg by mouth daily.       No current facility-administered medications for this visit.    No Known Allergies  History   Social History  . Marital Status: Single    Spouse Name: N/A    Number of Children: 0  . Years of Education: N/A   Occupational History  . HR administrator    Social History Main Topics  . Smoking status: Current Every Day Smoker -- 1.25 packs/day for 40 years    Types: Cigarettes  . Smokeless tobacco: Never Used     Comment: has a prescription for Chantix and states she will get it filled and has a desire to stop smoking occ alcohol  . Alcohol Use: Yes     Comment: occassional  . Drug Use: No  .  Sexual Activity: Not on file   Other Topics Concern  . Not on file   Social History Narrative     Review of Systems: General: negative for chills, fever, night sweats or weight changes.  Cardiovascular: negative for chest pain, dyspnea on exertion, edema, orthopnea, palpitations, paroxysmal nocturnal dyspnea or shortness of breath Dermatological: negative for rash Respiratory: negative for cough or wheezing Urologic: negative for hematuria Abdominal: negative for nausea, vomiting, diarrhea, bright  red blood per rectum, melena, or hematemesis Neurologic: negative for visual changes, syncope, or dizziness All other systems reviewed and are otherwise negative except as noted above.    Blood pressure 190/90, pulse 104, height 5' (1.524 m), weight 95 lb (43.092 kg).  General appearance: alert and no distress Neck: no adenopathy, no JVD, supple, symmetrical, trachea midline, thyroid not enlarged, symmetric, no tenderness/mass/nodules and soft left carotid bruit Lungs: clear to auscultation bilaterally Heart: regular rate and rhythm, S1, S2 normal, no murmur, click, rub or gallop Extremities: extremities normal, atraumatic, no cyanosis or edema  EKG not performed today  ASSESSMENT AND PLAN:   Occlusion and stenosis of carotid artery without mention of cerebral infarction History of carotid artery disease status post right carotid endarterectomy performed electively by Dr. Trula Slade 04/02/12.follow-up carotid Dopplers recently performed in our office 05/19/14 revealed this to be widely patent.  Atherosclerotic renal artery stenosis, bilateral History of left renal artery stenting by myself 06/08/10 with a 5 mm x 12 mm long stent with an excellent result. At that time she had a 50% ostial right renal artery stenosis with a downward takeoff. Renal Dopplers have shown progression of her right renal artery stenosis with a renal aortic ratio that increased from 6.34 on 11/05/13-7.22 on 05/19/14. 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 She is on multiple antihypertensive medications. I'm concerned that she has renal vascular hypertension and potential impending ischemic nephropathy with progression of disease in her only functioning kidney. I plan is to perform angiography and potential intervention on her right renal artery.  Hyperlipidemia On statin therapy, followed by her primary care physician. Continue current  medications of simvastatin.  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      Lorretta Harp MD FACP,FACC,FAHA, [facility] 06/07/2014 12:52 PM

## 2014-06-07 NOTE — Assessment & Plan Note (Signed)
On statin therapy, followed by her primary care physician. Continue current medications of simvastatin.

## 2014-06-07 NOTE — Patient Instructions (Signed)
Today Dr. Gwenlyn Found has ordered for you to have lab work done.   Dr. Gwenlyn Found has ordered a Renal Angiogram to be done at [facility].  This procedure is going to look at the bloodflow in your lower extremities.  If Dr. Gwenlyn Found is able to open up the arteries, you will have to spend one night in the hospital.  If he is not able to open the arteries, you will be able to go home that same day.    After the procedure, you will not be allowed to drive for 3 days or push, pull, or lift anything greater than 10 lbs for one week.    You will be required to have the following tests prior to the procedure:  1. Blood work-the blood work can be done no more than 7 days prior to the procedure.  It can be done at any [facility] lab.  There is one downstairs on the first floor of this building and one in the [place] ([address])  2. Chest Xray-the chest xray order has already been placed at the [facility].

## 2014-06-07 NOTE — Progress Notes (Signed)
Hospital Ordered entered for Renal Angiogram

## 2014-06-08 ENCOUNTER — Telehealth: Payer: Self-pay | Admitting: Cardiovascular Disease

## 2014-06-08 LAB — APTT: aPTT: 31 seconds (ref 24–37)

## 2014-06-08 LAB — BASIC METABOLIC PANEL
BUN: 17 mg/dL (ref 6–23)
CO2: 24 mEq/L (ref 19–32)
Calcium: 9.8 mg/dL (ref 8.4–10.5)
Chloride: 103 mEq/L (ref 96–112)
Creat: 1.03 mg/dL (ref 0.50–1.10)
Glucose, Bld: 91 mg/dL (ref 70–99)
Potassium: 4.4 mEq/L (ref 3.5–5.3)
Sodium: 138 mEq/L (ref 135–145)

## 2014-06-08 LAB — PROTIME-INR
INR: 1 (ref <1.50)
Prothrombin Time: 13.2 seconds (ref 11.6–15.2)

## 2014-06-08 LAB — CBC
HCT: 41.6 % (ref 36.0–46.0)
Hemoglobin: 13.9 g/dL (ref 12.0–15.0)
MCH: 31.4 pg (ref 26.0–34.0)
MCHC: 33.4 g/dL (ref 30.0–36.0)
MCV: 94.1 fL (ref 78.0–100.0)
MPV: 9.6 fL (ref 9.4–12.4)
Platelets: 239 10*3/uL (ref 150–400)
RBC: 4.42 MIL/uL (ref 3.87–5.11)
RDW: 15.7 % — ABNORMAL HIGH (ref 11.5–15.5)
WBC: 7.5 10*3/uL (ref 4.0–10.5)

## 2014-06-08 LAB — TSH: TSH: 3.581 u[IU]/mL (ref 0.350–4.500)

## 2014-06-08 NOTE — Telephone Encounter (Signed)
Patient has angiogram scheduled for December 14.  She doesn't have anyone that can stay with her when she gets home.  Wants to know if that's OK? Also says last time she had a stent she was told to stay off Plavix.  Her letter instructs that she should continue her Plavix....wants to verify.

## 2014-06-09 DIAGNOSIS — Z23 Encounter for immunization: Secondary | ICD-10-CM | POA: Diagnosis not present

## 2014-06-09 DIAGNOSIS — E782 Mixed hyperlipidemia: Secondary | ICD-10-CM | POA: Diagnosis not present

## 2014-06-09 DIAGNOSIS — I739 Peripheral vascular disease, unspecified: Secondary | ICD-10-CM | POA: Diagnosis not present

## 2014-06-09 DIAGNOSIS — I10 Essential (primary) hypertension: Secondary | ICD-10-CM | POA: Diagnosis not present

## 2014-06-10 NOTE — Telephone Encounter (Signed)
Lmom.  Continue plavix, should be okay that no one is staying with her. We anticipate her spending the night at the hospital

## 2014-06-14 DIAGNOSIS — Z124 Encounter for screening for malignant neoplasm of cervix: Secondary | ICD-10-CM | POA: Diagnosis not present

## 2014-06-14 DIAGNOSIS — Z1231 Encounter for screening mammogram for malignant neoplasm of breast: Secondary | ICD-10-CM | POA: Diagnosis not present

## 2014-06-15 DIAGNOSIS — I739 Peripheral vascular disease, unspecified: Secondary | ICD-10-CM | POA: Diagnosis not present

## 2014-06-15 DIAGNOSIS — I10 Essential (primary) hypertension: Secondary | ICD-10-CM | POA: Diagnosis not present

## 2014-06-15 DIAGNOSIS — N183 Chronic kidney disease, stage 3 (moderate): Secondary | ICD-10-CM | POA: Diagnosis not present

## 2014-06-21 DIAGNOSIS — I739 Peripheral vascular disease, unspecified: Secondary | ICD-10-CM | POA: Diagnosis not present

## 2014-06-21 DIAGNOSIS — I10 Essential (primary) hypertension: Secondary | ICD-10-CM | POA: Diagnosis not present

## 2014-06-24 DIAGNOSIS — H04123 Dry eye syndrome of bilateral lacrimal glands: Secondary | ICD-10-CM | POA: Diagnosis not present

## 2014-06-24 DIAGNOSIS — H524 Presbyopia: Secondary | ICD-10-CM | POA: Diagnosis not present

## 2014-06-24 DIAGNOSIS — H2513 Age-related nuclear cataract, bilateral: Secondary | ICD-10-CM | POA: Diagnosis not present

## 2014-06-24 DIAGNOSIS — H52203 Unspecified astigmatism, bilateral: Secondary | ICD-10-CM | POA: Diagnosis not present

## 2014-06-27 ENCOUNTER — Ambulatory Visit
Admission: RE | Admit: 2014-06-27 | Discharge: 2014-06-27 | Disposition: A | Discharge status: Home / Self Care | Payer: Medicare Other | Source: Ambulatory Visit | Attending: Cardiovascular Disease | Admitting: Cardiovascular Disease

## 2014-06-27 ENCOUNTER — Other Ambulatory Visit: Payer: Self-pay | Admitting: Cardiovascular Disease

## 2014-06-27 DIAGNOSIS — R5383 Other fatigue: Secondary | ICD-10-CM | POA: Diagnosis not present

## 2014-06-27 DIAGNOSIS — Z79899 Other long term (current) drug therapy: Secondary | ICD-10-CM | POA: Diagnosis not present

## 2014-06-27 DIAGNOSIS — I700 Atherosclerosis of aorta: Secondary | ICD-10-CM | POA: Diagnosis not present

## 2014-06-27 DIAGNOSIS — J439 Emphysema, unspecified: Secondary | ICD-10-CM | POA: Diagnosis not present

## 2014-06-27 DIAGNOSIS — D689 Coagulation defect, unspecified: Secondary | ICD-10-CM | POA: Diagnosis not present

## 2014-06-27 DIAGNOSIS — Z720 Tobacco use: Secondary | ICD-10-CM

## 2014-06-27 DIAGNOSIS — Z01810 Encounter for preprocedural cardiovascular examination: Secondary | ICD-10-CM | POA: Diagnosis not present

## 2014-06-27 LAB — CBC
HCT: 41.5 % (ref 36.0–46.0)
Hemoglobin: 14.1 g/dL (ref 12.0–15.0)
MCH: 31.9 pg (ref 26.0–34.0)
MCHC: 34 g/dL (ref 30.0–36.0)
MCV: 93.9 fL (ref 78.0–100.0)
MPV: 9.6 fL (ref 9.4–12.4)
Platelets: 266 10*3/uL (ref 150–400)
RBC: 4.42 MIL/uL (ref 3.87–5.11)
RDW: 15.8 % — ABNORMAL HIGH (ref 11.5–15.5)
WBC: 7.2 10*3/uL (ref 4.0–10.5)

## 2014-06-27 LAB — BASIC METABOLIC PANEL
BUN: 17 mg/dL (ref 6–23)
CO2: 27 mEq/L (ref 19–32)
Calcium: 9.9 mg/dL (ref 8.4–10.5)
Chloride: 101 mEq/L (ref 96–112)
Creat: 1.13 mg/dL — ABNORMAL HIGH (ref 0.50–1.10)
Glucose, Bld: 85 mg/dL (ref 70–99)
Potassium: 4.2 mEq/L (ref 3.5–5.3)
Sodium: 136 mEq/L (ref 135–145)

## 2014-06-28 LAB — PROTIME-INR
INR: 1.02 (ref <1.50)
Prothrombin Time: 13.4 seconds (ref 11.6–15.2)

## 2014-06-28 LAB — APTT: aPTT: 32 seconds (ref 24–37)

## 2014-06-28 LAB — TSH: TSH: 5.302 u[IU]/mL — ABNORMAL HIGH (ref 0.350–4.500)

## 2014-06-30 ENCOUNTER — Encounter (HOSPITAL_COMMUNITY): Payer: Self-pay | Admitting: Cardiovascular Disease

## 2014-07-04 ENCOUNTER — Encounter (HOSPITAL_COMMUNITY): Payer: Self-pay | Admitting: Cardiovascular Disease

## 2014-07-04 ENCOUNTER — Ambulatory Visit (HOSPITAL_COMMUNITY)
Admission: RE | Admit: 2014-07-04 | Discharge: 2014-07-04 | Disposition: A | Discharge status: Home / Self Care | Payer: Medicare Other | Source: Ambulatory Visit | Attending: Cardiovascular Disease | Admitting: Cardiovascular Disease

## 2014-07-04 ENCOUNTER — Encounter (HOSPITAL_COMMUNITY)
Admission: RE | Disposition: A | Discharge status: Home / Self Care | Payer: Self-pay | Source: Ambulatory Visit | Attending: Cardiovascular Disease

## 2014-07-04 DIAGNOSIS — I701 Atherosclerosis of renal artery: Secondary | ICD-10-CM

## 2014-07-04 DIAGNOSIS — Z7982 Long term (current) use of aspirin: Secondary | ICD-10-CM | POA: Diagnosis not present

## 2014-07-04 DIAGNOSIS — E785 Hyperlipidemia, unspecified: Secondary | ICD-10-CM | POA: Insufficient documentation

## 2014-07-04 DIAGNOSIS — I6529 Occlusion and stenosis of unspecified carotid artery: Secondary | ICD-10-CM | POA: Diagnosis not present

## 2014-07-04 DIAGNOSIS — F17210 Nicotine dependence, cigarettes, uncomplicated: Secondary | ICD-10-CM | POA: Diagnosis not present

## 2014-07-04 DIAGNOSIS — I739 Peripheral vascular disease, unspecified: Secondary | ICD-10-CM | POA: Diagnosis present

## 2014-07-04 DIAGNOSIS — I10 Essential (primary) hypertension: Secondary | ICD-10-CM | POA: Diagnosis not present

## 2014-07-04 HISTORY — PX: RENAL ANGIOGRAM: SHX5509

## 2014-07-04 HISTORY — PX: ABDOMINAL AORTAGRAM: SHX5706

## 2014-07-04 SURGERY — RENAL ANGIOGRAM
Anesthesia: LOCAL | Laterality: Right

## 2014-07-04 MED ORDER — HEPARIN SODIUM (PORCINE) 1000 UNIT/ML IJ SOLN
INTRAMUSCULAR | Status: AC
  Filled 2014-07-04: qty 1

## 2014-07-04 MED ORDER — ACETAMINOPHEN 325 MG PO TABS: 650.0000 mg | ORAL_TABLET | ORAL | Status: DC | PRN

## 2014-07-04 MED ORDER — ASPIRIN 81 MG PO CHEW: 81.0000 mg | CHEWABLE_TABLET | ORAL | Status: DC

## 2014-07-04 MED ORDER — ONDANSETRON HCL 4 MG/2ML IJ SOLN: 4.0000 mg | Freq: Four times a day (QID) | INTRAMUSCULAR | Status: DC | PRN

## 2014-07-04 MED ORDER — HEPARIN (PORCINE) IN NACL 2-0.9 UNIT/ML-% IJ SOLN
INTRAMUSCULAR | Status: AC
  Filled 2014-07-04: qty 500

## 2014-07-04 MED ORDER — LIDOCAINE HCL (PF) 1 % IJ SOLN
INTRAMUSCULAR | Status: AC
Start: 2014-07-04 — End: 2014-07-04
  Filled 2014-07-04: qty 30

## 2014-07-04 MED ORDER — FENTANYL CITRATE 0.05 MG/ML IJ SOLN
INTRAMUSCULAR | Status: AC
  Filled 2014-07-04: qty 2

## 2014-07-04 MED ORDER — HYDRALAZINE HCL 20 MG/ML IJ SOLN: 10.0000 mg | INTRAMUSCULAR | Status: DC | PRN

## 2014-07-04 MED ORDER — SODIUM CHLORIDE 0.9 % IV SOLN: INTRAVENOUS | Status: AC

## 2014-07-04 MED ORDER — MIDAZOLAM HCL 2 MG/2ML IJ SOLN
INTRAMUSCULAR | Status: AC
  Filled 2014-07-04: qty 2

## 2014-07-04 MED ORDER — SODIUM CHLORIDE 0.9 % IV SOLN
INTRAVENOUS | Status: DC
  Administered 2014-07-04: 07:00:00 via INTRAVENOUS

## 2014-07-04 MED ORDER — ASPIRIN EC 325 MG PO TBEC: 325.0000 mg | DELAYED_RELEASE_TABLET | Freq: Every day | ORAL | Status: DC

## 2014-07-04 MED ORDER — NITROGLYCERIN 1 MG/10 ML FOR IR/CATH LAB
INTRA_ARTERIAL | Status: AC
Start: 2014-07-04 — End: 2014-07-04
  Filled 2014-07-04: qty 10

## 2014-07-04 MED ORDER — SODIUM CHLORIDE 0.9 % IJ SOLN: 3.0000 mL | INTRAMUSCULAR | Status: DC | PRN

## 2014-07-04 NOTE — Progress Notes (Signed)
Left brachial arterial sheath removed by Alison Murray. Manual hold. Arrived to holding area w/long armboard on left arm.

## 2014-07-04 NOTE — H&P (View-Only) (Signed)
06/07/2014 [place]   June 13, 1946  [id number]  Primary Physician Merrilee Seashore, MD Primary Cardiologist: Lorretta Harp MD [name]   HPI:  The patient is a delightful 68 year old thin-appearing divorced Caucasian female with no children who I last saw on 04/27/14. She is retired from doing [place] at [employer]. She has a history of continued tobacco abuse at 1 pack per day, hypertension and hyperlipidemia with a strong family history for heart disease. Her mother had her first MI at age 55 and died at age 62. I have angiogrammed her revealing a tight right internal carotid artery stenosis, as well as a right subclavian artery stenosis. 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 I stented her left renal artery and she has moderate bilateral iliac disease but really denies claudication.She did had elective right carotid endarterectomy performed by Dr. Trula Slade April 02, 2012.since I saw her a year ago she's been remarkably stable. Carotid Dopplers performed /23/15 revealed a widely patent right carotid endarterectomy site and minimal left carotid disease. Renal Dopplers social progression of disease in the right renal artery. Her left kidney continues to diminish in size despite a patent stent. Recent renal Dopplers performed in April show progression of disease on the right as well.Lower extremity arterial Dopplers to show progression of disease in the iliac arteries as she denies claudication.she denies chest pain or shortness of breath. 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 Follow-up renal Doppler studies performed on 05/19/14 revealed progression of disease in her right renal artery with a renal aortic ratio that went from 6.34-7.74. I'm concerned that she has progression of disease in her only functioning kidney. My plan is to perform  angiography and potential intervention for renal preservation and treatment of renal vascular hypertension.   Current Outpatient Prescriptions  Medication Sig Dispense Refill  . aspirin 81 MG tablet Take 162 mg by mouth daily.     . clopidogrel (PLAVIX) 75 MG tablet TAKE 1 TABLET DAILY 90 tablet 2  . doxazosin (CARDURA) 8 MG tablet Take 8 mg by mouth every morning.      . ibandronate (BONIVA) 150 MG tablet Take 150 mg by mouth every 30 (thirty) days.     Marland Kitchen levothyroxine (SYNTHROID, LEVOTHROID) 88 MCG tablet Take 88 mcg by mouth daily before breakfast.    . losartan (COZAAR) 100 MG tablet Take 100 mg by mouth daily.     . Multiple Vitamins-Minerals (MULTIVITAMIN WITH MINERALS) tablet Take 1 tablet by mouth daily.      . rosuvastatin (CRESTOR) 10 MG tablet Take 10 mg by mouth daily.       No current facility-administered medications for this visit.    No Known Allergies  History   Social History  . Marital Status: Single    Spouse Name: N/A    Number of Children: 0  . Years of Education: N/A   Occupational History  . HR administrator    Social History Main Topics  . Smoking status: Current Every Day Smoker -- 1.25 packs/day for 40 years    Types: Cigarettes  . Smokeless tobacco: Never Used     Comment: has a prescription for Chantix and states she will get it filled and has a desire to stop smoking occ alcohol  . Alcohol Use: Yes     Comment: occassional  . Drug Use: No  .  Sexual Activity: Not on file   Other Topics Concern  . Not on file   Social History Narrative     Review of Systems: General: negative for chills, fever, night sweats or weight changes.  Cardiovascular: negative for chest pain, dyspnea on exertion, edema, orthopnea, palpitations, paroxysmal nocturnal dyspnea or shortness of breath Dermatological: negative for rash Respiratory: negative for cough or wheezing Urologic: negative for hematuria Abdominal: negative for nausea, vomiting, diarrhea, bright  red blood per rectum, melena, or hematemesis Neurologic: negative for visual changes, syncope, or dizziness All other systems reviewed and are otherwise negative except as noted above.    Blood pressure 190/90, pulse 104, height 5' (1.524 m), weight 95 lb (43.092 kg).  General appearance: alert and no distress Neck: no adenopathy, no JVD, supple, symmetrical, trachea midline, thyroid not enlarged, symmetric, no tenderness/mass/nodules and soft left carotid bruit Lungs: clear to auscultation bilaterally Heart: regular rate and rhythm, S1, S2 normal, no murmur, click, rub or gallop Extremities: extremities normal, atraumatic, no cyanosis or edema  EKG not performed today  ASSESSMENT AND PLAN:   Occlusion and stenosis of carotid artery without mention of cerebral infarction History of carotid artery disease status post right carotid endarterectomy performed electively by Dr. Trula Slade 04/02/12.follow-up carotid Dopplers recently performed in our office 05/19/14 revealed this to be widely patent.  Atherosclerotic renal artery stenosis, bilateral History of left renal artery stenting by myself 06/08/10 with a 5 mm x 12 mm long stent with an excellent result. At that time she had a 50% ostial right renal artery stenosis with a downward takeoff. Renal Dopplers have shown progression of her right renal artery stenosis with a renal aortic ratio that increased from 6.34 on 11/05/13-7.22 on 05/19/14. 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 She is on multiple antihypertensive medications. I'm concerned that she has renal vascular hypertension and potential impending ischemic nephropathy with progression of disease in her only functioning kidney. I plan is to perform angiography and potential intervention on her right renal artery.  Hyperlipidemia On statin therapy, followed by her primary care physician. Continue current  medications of simvastatin.  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      Lorretta Harp MD FACP,FACC,FAHA, [facility] 06/07/2014 12:52 PM

## 2014-07-04 NOTE — Interval H&P Note (Signed)
History and Physical Interval Note:  07/04/2014 7:40 AM  Kendra Watts  has presented today for surgery, with the diagnosis of renal artery stenosis  The various methods of treatment have been discussed with the patient and family. After consideration of risks, benefits and other options for treatment, the patient has consented to  Procedure(s): RENAL ANGIOGRAM (N/A) as a surgical intervention .  The patient's history has been reviewed, patient examined, no change in status, stable for surgery.  I have reviewed the patient's chart and labs.  Questions were answered to the patient's satisfaction.     Lorretta Harp

## 2014-07-04 NOTE — Discharge Instructions (Signed)

## 2014-07-04 NOTE — CV Procedure (Signed)
Kendra Watts is a 68 y.o. female    [id number] LOCATION:  FACILITY: [facility]  PHYSICIAN: Quay Burow, M.D. Dec 27, 1945   DATE OF PROCEDURE:  07/04/2014  DATE OF DISCHARGE:     PV Angiogram/Intervention    History obtained from chart review.The patient is a delightful 68 year old thin-appearing divorced Caucasian female with no children who I last saw on 04/27/14. She is retired from doing [place] at [employer]. She has a history of continued tobacco abuse at 1 pack per day, hypertension and hyperlipidemia with a strong family history for heart disease. Her mother had her first MI at age 56 and died at age 45. I have angiogrammed her revealing a tight right internal carotid artery stenosis, as well as a right subclavian artery stenosis. 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 I stented her left renal artery and she has moderate bilateral iliac disease but really denies claudication.She did had elective right carotid endarterectomy performed by Dr. Trula Slade April 02, 2012.since I saw her a year ago she's been remarkably stable. Carotid Dopplers performed /23/15 revealed a widely patent right carotid endarterectomy site and minimal left carotid disease. Renal Dopplers social progression of disease in the right renal artery. Her left kidney continues to diminish in size despite a patent stent. Recent renal Dopplers performed in April show progression of disease on the right as well.Lower extremity arterial Dopplers to show progression of disease in the iliac arteries as she denies claudication.she denies chest pain or shortness of breath. 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 Follow-up renal Doppler studies performed on 05/19/14 revealed progression of disease in her right renal artery with a renal aortic ratio that went from 6.34-7.68. I'm concerned that she has  progression of disease in her only functioning kidney. My plan is to perform angiography and potential intervention for renal preservation and treatment of renal vascular hypertension.   PROCEDURE DESCRIPTION:   The patient was brought to the second floor [facility] lab in the postabsorptive state. She was premedicated with Valium 5 mg by mouth, IV Versed and fentanyl. Her left antecubital area was prepped and shaved in usual sterile fashion. Xylocaine 1% was used for local anesthesia. A 6 French sheath was inserted into the left brachial artery using standard Seldinger technique and a micropuncture needle.. The patient received 2000 units  of heparin  Intravenously along with intra-arterial nitroglycerin..  A 5 French long pigtail catheter was used to perform abdominal aortography. A 6 [place] multipurpose A1 guiding catheter was used for selective right renal angiography. Visipaque dye was used for the entirety of the case (45 systolic palpation). Retrograde aortic pressure was monitored during the case.    HEMODYNAMICS:    AO SYSTOLIC/AO DIASTOLIC: 841/32   Angiographic Data:   1: Abdominal aortogram-the midabdominal aorta in the perirenal area had severe calcifications fluoroscopically. The left renal stent appeared functionally occluded. There was a high-grade calcified plaque at the origin of the right renal artery. Selective angiography using a multipurpose guide catheter confirm this with a large calcified plaque involving the perirenal aorta and extending into the renal artery.  IMPRESSION:Miss Kendra Watts has an occluded left renal artery stent with high grade calcified right renal artery stenosis with a plaque that begins in the aorta and extends into the origin of the renal artery. I believe this is a high-risk procedure given that she has "  a solitary kidney" with potential for dissection or rupture given the height calcium burden. Her surgical options are limited however. She  may need a covered stent in the origin however this would require a long multipurpose 6 French sheath with scoping multipurpose guide catheter to gain access with bailout being hepatic artery to renal bypass in the event that she had a complication. I reviewed the case with Dr. Gae Gallop . The patient will be discharged home today is now patient I will see her back in the office for review of her options.    Lorretta Harp MD, [facility] 07/04/2014 8:52 AM

## 2014-07-04 NOTE — Progress Notes (Signed)
Site area: left brachial Site Prior to Removal:  Level 0 Pressure Applied For: 30 minutes by RCox Manual:   yes Patient Status During Pull:  stable Post Pull Site:  Level 0 Post Pull Instructions Given:  yes Post Pull Pulses Present: yes Dressing Applied:  Tegaderm; long arm board on Bedrest begins @ 0945 Comments: no complications

## 2014-07-05 ENCOUNTER — Telehealth: Payer: Self-pay | Admitting: Cardiovascular Disease

## 2014-07-05 LAB — POCT ACTIVATED CLOTTING TIME: Activated Clotting Time: 171 seconds

## 2014-07-05 NOTE — Telephone Encounter (Signed)
Please take a look at Dr. Kennon Holter schedule and add this patient on and it can be after the new year. Thank you!

## 2014-07-05 NOTE — Telephone Encounter (Signed)
Spoke with pt, aware per dr berry he did not do anything to the renal arteries yesterday due to the high grade calcification. This will be discussed with patient in more detail at the follow up appointment. Will send to schedulers for contacting the patinet with appt.

## 2014-07-05 NOTE — Telephone Encounter (Signed)
Pt had angiogram yesterday,she need to know what she was told after the procedure. Please call,says she is sorry that she does not remember.

## 2014-07-06 ENCOUNTER — Other Ambulatory Visit (HOSPITAL_COMMUNITY): Payer: Self-pay | Admitting: Cardiovascular Disease

## 2014-07-26 ENCOUNTER — Telehealth: Payer: Self-pay | Admitting: Cardiovascular Disease

## 2014-07-26 NOTE — Telephone Encounter (Signed)
Closed encounter °

## 2014-07-28 DIAGNOSIS — M533 Sacrococcygeal disorders, not elsewhere classified: Secondary | ICD-10-CM | POA: Diagnosis not present

## 2014-07-28 DIAGNOSIS — M5432 Sciatica, left side: Secondary | ICD-10-CM | POA: Diagnosis not present

## 2014-07-29 ENCOUNTER — Encounter: Payer: Self-pay | Admitting: Cardiovascular Disease

## 2014-07-29 ENCOUNTER — Ambulatory Visit (INDEPENDENT_AMBULATORY_CARE_PROVIDER_SITE_OTHER): Payer: Medicare Other | Admitting: Cardiovascular Disease

## 2014-07-29 VITALS — BP 154/70 | HR 84 | Ht 60.0 in | Wt 94.0 lb

## 2014-07-29 DIAGNOSIS — D689 Coagulation defect, unspecified: Secondary | ICD-10-CM | POA: Diagnosis not present

## 2014-07-29 DIAGNOSIS — I701 Atherosclerosis of renal artery: Secondary | ICD-10-CM

## 2014-07-29 DIAGNOSIS — Z79899 Other long term (current) drug therapy: Secondary | ICD-10-CM | POA: Diagnosis not present

## 2014-07-29 DIAGNOSIS — Z720 Tobacco use: Secondary | ICD-10-CM

## 2014-07-29 NOTE — Assessment & Plan Note (Signed)
History of remote left renal artery stenting with documentation of occlusion. Dopplers have shown progression of disease on the right with an increase in her right renal aortic ratio from 6.34-7.22. I intraventricular 07/04/14. The left brachial approach again demonstrating the occluded left renal artery stent and 90% calcified ostial right renal artery stenosis. The renal artery was downgoing making revascularization to the arm normally anatomically viable option. I'm going to review her case with Dr. Trula Slade . I suspect that without revascularization she will ultimately progressed to occlusion requiring dialysis which she does not want. The only other option besides PTA and stenting would be renal bypass. I'm planning to proceed with stenting of her right renal artery using an ICast  covered stent to the left brachial approach. I reviewed the risks and benefits with the patient.

## 2014-07-29 NOTE — Progress Notes (Signed)
07/29/2014 Kendra Watts [facility]   [birth year]  [id number]  Primary Physician Merrilee Seashore, MD Primary Cardiologist: Lorretta Harp MD [name]   HPI:  The patient is a delightful 69 year old thin-appearing divorced Caucasian female with no children who I last saw on 06/07/14. She is retired from doing [place] at [employer]. She has a history of continued tobacco abuse at 1 pack per day, hypertension and hyperlipidemia with a strong family history for heart disease. Her mother had her first MI at age 20 and died at age 37. I have angiogrammed her revealing a tight right internal carotid artery stenosis, as well as a right subclavian artery stenosis. 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 I stented her left renal artery and she has moderate bilateral iliac disease but really denies claudication.She did had elective right carotid endarterectomy performed by Dr. Trula Slade April 02, 2012.since I saw her a year ago she's been remarkably stable. Carotid Dopplers performed /23/15 revealed a widely patent right carotid endarterectomy site and minimal left carotid disease. Renal Dopplers social progression of disease in the right renal artery. Her left kidney continues to diminish in size despite a patent stent. Recent renal Dopplers performed in April show progression of disease on the right as well.Lower extremity arterial Dopplers to show progression of disease in the iliac arteries as she denies claudication.she denies chest pain or shortness of breath. 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 Follow-up renal Doppler studies performed on 05/19/14 revealed progression of disease in her right renal artery with a renal aortic ratio that went from 6.34-7.69. I'm concerned that she has progression of disease in her only functioning kidney. I performed angiography  on her 07/04/14 via the left brachial approach revealing occluded left renal artery stent and 90% calcified ostial downgoing right renal artery. I decided not to intervene at that time. I'm slightly concerned about the technical difficulties with an ostial calcified lesion with regard to dissection and/or rupture. Her only other option would be renal bypass surgery. I'm going to review her angiogram with her vascular surgeon, Dr. Trula Slade , who did her carotid endarterectomy at my request. My plan will be to use an ICast covered stent.   Current Outpatient Prescriptions  Medication Sig Dispense Refill  . amLODipine (NORVASC) 10 MG tablet Take 10 mg by mouth every morning.    Marland Kitchen aspirin 81 MG tablet Take 162 mg by mouth every morning.     . clopidogrel (PLAVIX) 75 MG tablet TAKE 1 TABLET DAILY 90 tablet 1  . doxazosin (CARDURA) 8 MG tablet Take 8 mg by mouth every morning.      . ibandronate (BONIVA) 150 MG tablet Take 150 mg by mouth every 30 (thirty) days. First of each month    . levothyroxine (SYNTHROID, LEVOTHROID) 88 MCG tablet Take 88 mcg by mouth daily before breakfast.    . losartan (COZAAR) 100 MG tablet Take 100 mg by mouth every morning.     . Multiple Vitamins-Minerals (MULTIVITAMIN WITH MINERALS) tablet Take 1 tablet by mouth every morning.     . Naphazoline HCl (CLEAR EYES OP) Apply 2 drops to eye 2 (two) times daily.    . rosuvastatin (CRESTOR) 10 MG tablet Take 10 mg by mouth every morning.     . methylPREDNIsolone (MEDROL DOSPACK) 4 MG tablet      No current facility-administered medications  for this visit.    No Known Allergies  History   Social History  . Marital Status: Single    Spouse Name: N/A    Number of Children: 0  . Years of Education: N/A   Occupational History  . HR administrator    Social History Main Topics  . Smoking status: Current Every Day Smoker -- 1.25 packs/day for 40 years    Types: Cigarettes  . Smokeless tobacco: Never Used     Comment: has  a prescription for Chantix and states she will get it filled and has a desire to stop smoking occ alcohol  . Alcohol Use: Yes     Comment: occassional  . Drug Use: No  . Sexual Activity: Not on file   Other Topics Concern  . Not on file   Social History Narrative     Review of Systems: General: negative for chills, fever, night sweats or weight changes.  Cardiovascular: negative for chest pain, dyspnea on exertion, edema, orthopnea, palpitations, paroxysmal nocturnal dyspnea or shortness of breath Dermatological: negative for rash Respiratory: negative for cough or wheezing Urologic: negative for hematuria Abdominal: negative for nausea, vomiting, diarrhea, bright red blood per rectum, melena, or hematemesis Neurologic: negative for visual changes, syncope, or dizziness All other systems reviewed and are otherwise negative except as noted above.    Blood pressure 154/70, pulse 84, height 5' (1.524 m), weight 94 lb (42.638 kg).  General appearance: alert and no distress Neck: no adenopathy, no JVD, supple, symmetrical, trachea midline, thyroid not enlarged, symmetric, no tenderness/mass/nodules and soft bilateral carotid bruits Lungs: clear to auscultation bilaterally Heart: regular rate and rhythm, S1, S2 normal, no murmur, click, rub or gallop Extremities: her left brachial arterial puncture site is well-healed  EKG not performed today  ASSESSMENT AND PLAN:   Tobacco abuse She continued to smoke and is recalcitrant risk factor modification  Atherosclerotic renal artery stenosis, bilateral History of remote left renal artery stenting with documentation of occlusion. Dopplers have shown progression of disease on the right with an increase in her right renal aortic ratio from 6.34-7.22. I intraventricular 07/04/14. The left brachial approach again demonstrating the occluded left renal artery stent and 90% calcified ostial right renal artery stenosis. The renal artery was  downgoing making revascularization to the arm normally anatomically viable option. I'm going to review her case with Dr. Trula Slade . I suspect that without revascularization she will ultimately progressed to occlusion requiring dialysis which she does not want. The only other option besides PTA and stenting would be renal bypass. I'm planning to proceed with stenting of her right renal artery using an ICast  covered stent to the left brachial approach. I reviewed the risks and benefits with the patient.      Lorretta Harp MD FACP,FACC,FAHA, [facility] 07/29/2014 9:17 AM

## 2014-07-29 NOTE — Patient Instructions (Signed)
Dr. Gwenlyn Found has ordered a renal angiogram to be done at [facility].  This procedure is going to look at the bloodflow in your lower extremities.  If Dr. Gwenlyn Found is able to open up the arteries, you will have to spend one night in the hospital.  If he is not able to open the arteries, you will be able to go home that same day.    After the procedure, you will not be allowed to drive for 3 days or push, pull, or lift anything greater than 10 lbs for one week.    You will be required to have the following tests prior to the procedure:  1. Blood work-the blood work can be done no more than 7 days prior to the procedure.  It can be done at any [facility] lab.  There is one downstairs on the first floor of this building and one in the [place] ([address])  *REPS Todd  Left brachial approach

## 2014-07-29 NOTE — Assessment & Plan Note (Signed)
She continued to smoke and is recalcitrant risk factor modification

## 2014-08-01 DIAGNOSIS — M5432 Sciatica, left side: Secondary | ICD-10-CM | POA: Diagnosis not present

## 2014-08-04 DIAGNOSIS — F17210 Nicotine dependence, cigarettes, uncomplicated: Secondary | ICD-10-CM | POA: Diagnosis not present

## 2014-08-04 DIAGNOSIS — I10 Essential (primary) hypertension: Secondary | ICD-10-CM | POA: Diagnosis not present

## 2014-08-04 DIAGNOSIS — I739 Peripheral vascular disease, unspecified: Secondary | ICD-10-CM | POA: Diagnosis not present

## 2014-08-04 DIAGNOSIS — E782 Mixed hyperlipidemia: Secondary | ICD-10-CM | POA: Diagnosis not present

## 2014-08-05 DIAGNOSIS — M5432 Sciatica, left side: Secondary | ICD-10-CM | POA: Diagnosis not present

## 2014-08-10 DIAGNOSIS — M5432 Sciatica, left side: Secondary | ICD-10-CM | POA: Diagnosis not present

## 2014-08-17 DIAGNOSIS — M5432 Sciatica, left side: Secondary | ICD-10-CM | POA: Diagnosis not present

## 2014-08-17 DIAGNOSIS — D689 Coagulation defect, unspecified: Secondary | ICD-10-CM | POA: Diagnosis not present

## 2014-08-17 DIAGNOSIS — Z79899 Other long term (current) drug therapy: Secondary | ICD-10-CM | POA: Diagnosis not present

## 2014-08-17 LAB — PROTIME-INR
INR: 0.97 (ref <1.50)
Prothrombin Time: 12.9 seconds (ref 11.6–15.2)

## 2014-08-17 LAB — APTT: aPTT: 31 seconds (ref 24–37)

## 2014-08-18 LAB — BASIC METABOLIC PANEL
BUN: 14 mg/dL (ref 6–23)
CO2: 27 mEq/L (ref 19–32)
Calcium: 9.4 mg/dL (ref 8.4–10.5)
Chloride: 101 mEq/L (ref 96–112)
Creat: 0.95 mg/dL (ref 0.50–1.10)
Glucose, Bld: 136 mg/dL — ABNORMAL HIGH (ref 70–99)
Potassium: 3.9 mEq/L (ref 3.5–5.3)
Sodium: 136 mEq/L (ref 135–145)

## 2014-08-18 LAB — CBC
HCT: 39.8 % (ref 36.0–46.0)
Hemoglobin: 12.8 g/dL (ref 12.0–15.0)
MCH: 30.8 pg (ref 26.0–34.0)
MCHC: 32.2 g/dL (ref 30.0–36.0)
MCV: 95.9 fL (ref 78.0–100.0)
MPV: 9.1 fL (ref 8.6–12.4)
Platelets: 301 10*3/uL (ref 150–400)
RBC: 4.15 MIL/uL (ref 3.87–5.11)
RDW: 14.5 % (ref 11.5–15.5)
WBC: 7.5 10*3/uL (ref 4.0–10.5)

## 2014-08-19 DIAGNOSIS — M5432 Sciatica, left side: Secondary | ICD-10-CM | POA: Diagnosis not present

## 2014-08-22 ENCOUNTER — Encounter (HOSPITAL_COMMUNITY): Payer: Self-pay | Admitting: General Practice

## 2014-08-22 ENCOUNTER — Ambulatory Visit (HOSPITAL_COMMUNITY)
Admission: RE | Admit: 2014-08-22 | Discharge: 2014-08-23 | Disposition: A | Discharge status: Home / Self Care | Payer: Medicare Other | Source: Ambulatory Visit | Attending: Cardiovascular Disease | Admitting: Cardiovascular Disease

## 2014-08-22 ENCOUNTER — Encounter (HOSPITAL_COMMUNITY)
Admission: RE | Disposition: A | Discharge status: Home / Self Care | Payer: Self-pay | Source: Ambulatory Visit | Attending: Cardiovascular Disease

## 2014-08-22 DIAGNOSIS — E876 Hypokalemia: Secondary | ICD-10-CM | POA: Diagnosis not present

## 2014-08-22 DIAGNOSIS — Z79899 Other long term (current) drug therapy: Secondary | ICD-10-CM

## 2014-08-22 DIAGNOSIS — Z720 Tobacco use: Secondary | ICD-10-CM

## 2014-08-22 DIAGNOSIS — I10 Essential (primary) hypertension: Secondary | ICD-10-CM | POA: Diagnosis present

## 2014-08-22 DIAGNOSIS — I129 Hypertensive chronic kidney disease with stage 1 through stage 4 chronic kidney disease, or unspecified chronic kidney disease: Secondary | ICD-10-CM | POA: Diagnosis not present

## 2014-08-22 DIAGNOSIS — I701 Atherosclerosis of renal artery: Secondary | ICD-10-CM | POA: Insufficient documentation

## 2014-08-22 DIAGNOSIS — E039 Hypothyroidism, unspecified: Secondary | ICD-10-CM | POA: Insufficient documentation

## 2014-08-22 DIAGNOSIS — E785 Hyperlipidemia, unspecified: Secondary | ICD-10-CM | POA: Diagnosis not present

## 2014-08-22 DIAGNOSIS — I739 Peripheral vascular disease, unspecified: Secondary | ICD-10-CM | POA: Diagnosis not present

## 2014-08-22 DIAGNOSIS — Q600 Renal agenesis, unilateral: Secondary | ICD-10-CM | POA: Diagnosis not present

## 2014-08-22 DIAGNOSIS — Z7982 Long term (current) use of aspirin: Secondary | ICD-10-CM | POA: Diagnosis not present

## 2014-08-22 DIAGNOSIS — N189 Chronic kidney disease, unspecified: Secondary | ICD-10-CM | POA: Insufficient documentation

## 2014-08-22 DIAGNOSIS — Z7902 Long term (current) use of antithrombotics/antiplatelets: Secondary | ICD-10-CM | POA: Diagnosis not present

## 2014-08-22 DIAGNOSIS — F17210 Nicotine dependence, cigarettes, uncomplicated: Secondary | ICD-10-CM | POA: Insufficient documentation

## 2014-08-22 DIAGNOSIS — D689 Coagulation defect, unspecified: Secondary | ICD-10-CM

## 2014-08-22 HISTORY — DX: Tobacco use: Z72.0

## 2014-08-22 HISTORY — PX: RENAL ANGIOGRAM: SHX5509

## 2014-08-22 HISTORY — DX: Hypothyroidism, unspecified: E03.9

## 2014-08-22 LAB — POCT ACTIVATED CLOTTING TIME: Activated Clotting Time: 171 seconds

## 2014-08-22 SURGERY — RENAL ANGIOGRAM
Anesthesia: LOCAL | Laterality: Right

## 2014-08-22 MED ORDER — DOXAZOSIN MESYLATE 8 MG PO TABS
8.0000 mg | ORAL_TABLET | ORAL | Status: DC
  Administered 2014-08-23: 8 mg via ORAL
  Filled 2014-08-22 (×3): qty 1

## 2014-08-22 MED ORDER — SODIUM CHLORIDE 0.9 % IJ SOLN: 3.0000 mL | INTRAMUSCULAR | Status: DC | PRN

## 2014-08-22 MED ORDER — LEVOTHYROXINE SODIUM 88 MCG PO TABS
88.0000 ug | ORAL_TABLET | Freq: Every day | ORAL | Status: DC
  Administered 2014-08-23: 10:00:00 88 ug via ORAL
  Filled 2014-08-22 (×2): qty 1

## 2014-08-22 MED ORDER — MIDAZOLAM HCL 2 MG/2ML IJ SOLN
INTRAMUSCULAR | Status: AC
  Filled 2014-08-22: qty 2

## 2014-08-22 MED ORDER — AMLODIPINE BESYLATE 10 MG PO TABS
10.0000 mg | ORAL_TABLET | Freq: Every morning | ORAL | Status: DC
  Administered 2014-08-23: 10:00:00 10 mg via ORAL
  Filled 2014-08-22 (×2): qty 1

## 2014-08-22 MED ORDER — FENTANYL CITRATE 0.05 MG/ML IJ SOLN
INTRAMUSCULAR | Status: AC
  Filled 2014-08-22: qty 2

## 2014-08-22 MED ORDER — ASPIRIN EC 325 MG PO TBEC
325.0000 mg | DELAYED_RELEASE_TABLET | Freq: Every day | ORAL | Status: DC
  Filled 2014-08-22: qty 1

## 2014-08-22 MED ORDER — LIDOCAINE HCL (PF) 1 % IJ SOLN
INTRAMUSCULAR | Status: AC
  Filled 2014-08-22: qty 30

## 2014-08-22 MED ORDER — CLOPIDOGREL BISULFATE 75 MG PO TABS
75.0000 mg | ORAL_TABLET | Freq: Every day | ORAL | Status: DC
  Administered 2014-08-23: 09:00:00 75 mg via ORAL
  Filled 2014-08-22: qty 1

## 2014-08-22 MED ORDER — ACETAMINOPHEN 325 MG PO TABS: 650.0000 mg | ORAL_TABLET | ORAL | Status: DC | PRN

## 2014-08-22 MED ORDER — ROSUVASTATIN CALCIUM 10 MG PO TABS
10.0000 mg | ORAL_TABLET | Freq: Every morning | ORAL | Status: DC
  Administered 2014-08-23: 10:00:00 10 mg via ORAL
  Filled 2014-08-22 (×2): qty 1

## 2014-08-22 MED ORDER — HEPARIN SODIUM (PORCINE) 1000 UNIT/ML IJ SOLN
INTRAMUSCULAR | Status: AC
  Filled 2014-08-22: qty 1

## 2014-08-22 MED ORDER — HEPARIN (PORCINE) IN NACL 2-0.9 UNIT/ML-% IJ SOLN
INTRAMUSCULAR | Status: AC
  Filled 2014-08-22: qty 1000

## 2014-08-22 MED ORDER — SODIUM CHLORIDE 0.9 % IV SOLN
INTRAVENOUS | Status: DC
  Administered 2014-08-22: 07:00:00 via INTRAVENOUS

## 2014-08-22 MED ORDER — ASPIRIN 81 MG PO CHEW: 81.0000 mg | CHEWABLE_TABLET | ORAL | Status: DC

## 2014-08-22 MED ORDER — ASPIRIN 81 MG PO CHEW
162.0000 mg | CHEWABLE_TABLET | Freq: Every morning | ORAL | Status: DC
  Filled 2014-08-22: qty 2

## 2014-08-22 MED ORDER — LOSARTAN POTASSIUM 50 MG PO TABS
100.0000 mg | ORAL_TABLET | Freq: Every morning | ORAL | Status: DC
  Administered 2014-08-23: 100 mg via ORAL
  Filled 2014-08-22 (×2): qty 2

## 2014-08-22 MED ORDER — SODIUM CHLORIDE 0.9 % IV SOLN: INTRAVENOUS | Status: AC

## 2014-08-22 MED ORDER — ONDANSETRON HCL 4 MG/2ML IJ SOLN: 4.0000 mg | Freq: Four times a day (QID) | INTRAMUSCULAR | Status: DC | PRN

## 2014-08-22 MED ORDER — CLOPIDOGREL BISULFATE 75 MG PO TABS: 75.0000 mg | ORAL_TABLET | Freq: Every day | ORAL | Status: DC

## 2014-08-22 NOTE — CV Procedure (Signed)
Kendra Watts is a 69 y.o. female    [id number] LOCATION:  FACILITY: [place]  PHYSICIAN: Quay Burow, M.D. [birth year]   DATE OF PROCEDURE:  08/22/2014  DATE OF DISCHARGE:     PV Angiogram/Intervention    History obtained from chart review.The patient is a delightful 69 year old thin-appearing divorced Caucasian female with no children who I last saw on 06/07/14. She is retired from doing [place] at [employer]. She has a history of continued tobacco abuse at 1 pack per day, hypertension and hyperlipidemia with a strong family history for heart disease. Her mother had her first MI at age 4 and died at age 1. I have angiogrammed her revealing a tight right internal carotid artery stenosis, as well as a right subclavian artery stenosis. 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 I stented her left renal artery and she has moderate bilateral iliac disease but really denies claudication.She did had elective right carotid endarterectomy performed by Dr. Trula Slade April 02, 2012.since I saw her a year ago she's been remarkably stable. Carotid Dopplers performed /23/15 revealed a widely patent right carotid endarterectomy site and minimal left carotid disease. Renal Dopplers social progression of disease in the right renal artery. Her left kidney continues to diminish in size despite a patent stent. Recent renal Dopplers performed in April show progression of disease on the right as well.Lower extremity arterial Dopplers to show progression of disease in the iliac arteries as she denies claudication.she denies chest pain or shortness of breath. 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 Follow-up renal Doppler studies performed on 05/19/14 revealed progression of disease in her right renal artery with a renal aortic ratio that went from 6.34-7.65. I'm concerned that she has  progression of disease in her only functioning kidney. I performed angiography on her 07/04/14 via the left brachial approach revealing occluded left renal artery stent and 90% calcified ostial downgoing right renal artery. I decided not to intervene at that time. I'm slightly concerned about the technical difficulties with an ostial calcified lesion with regard to dissection and/or rupture. Her only other option would be renal bypass surgery. I'm reviewed her angiogram with her vascular surgeon, Dr. Trula Slade , who did her carotid endarterectomy at my request. He agrees with my plan to proceed with percutaneous intervention using a "covered stent". My plan will be to use an ICast covered stent.   PROCEDURE DESCRIPTION:   The patient was brought to the second floor [facility] lab in the postabsorptive state. She was premedicated with Valium 5 mg by mouth, IV Versed and fentanyl. Her left antecubital fossa was prepped and shaved in usual sterile fashion. Xylocaine 1% was used for local anesthesia. A 5 French sheath was inserted into the left brachial artery using standard Seldinger/ micropuncture  technique.   HEMODYNAMICS:    AO SYSTOLIC/AO DIASTOLIC: 244/62    Procedure Description:I accessed the left brachial artery with a micropuncture needle exchanged for a micropuncture catheter. The patient received 5000 units of heparin intravenously with an ACT of 214 at the end of the case. Total contrast administered to the patient was 90 mL. I then placed a 0.35 cm Versed for wire in the abdominal aorta and advanced the 6 French/90 cm long multipurpose curve Terumo sheath over this to the level of the renal arteries. I was unable to cross the highly calcified high-grade ostial  right renal artery stenosis with a 0.14/300 cm long [place] core wire. I ballooned this with a 2.5 operated to a 3.5 balloon. I then was able to cross the ostium with a 5 [place] "slip cath" and exchanged for a 0.35/260 cm long  Rosen wire. Following this I crossed the lesion with a 5 mm x 22 mm long ICast  Stent which I carefully positioned angiographically at the ostium of the right renal artery and deployed at 10-12 atm. I postdilated the ostium with a 6 mm balloon up to 10 atm resulting in reduction of a 95% calcified ostial solitary right renal artery stenosis to 0% residual. The patient tolerated the procedure well. The sheath was removed and exchanged over wire for a short 6 French brachial sheath which was then secured. The patient left the lab in stable condition.  Final Impression: successful PTA and stenting of high-grade ostial calcified right renal artery stenosis in the setting of "solitary kidney with renal vascular hypertension and worsening velocities by duplex ultrasound for renal preservation and treatment of hypertension with ICast covered stent. The patient previously had had left renal artery stented and this ultimately restenosed and occluded. The sheath will be removed once ACT falls below 170 and pressure held. The patient left the lab in stable condition. She'll be hydrated overnight and renal function will be assessed and she'll be discharged home in the morning on dual antiplatelet therapy. We will obtain renal Doppler studies in our [facility] line  office next week. She will see mid-level provider back in 2-3 weeks on the day that I'm in the office for follow-up.   Lorretta Harp MD, [facility] 08/22/2014 9:23 AM

## 2014-08-22 NOTE — Progress Notes (Signed)
Site area: Left Brachial artery Site Prior to Removal:  Level  1  Raised with bruise Pressure Applied For: 50min Manual:   By hand Patient Status During Pull:  stable Post Pull Site:  Level  1-non-raised with bruise Post Pull Instructions Given:  yes Post Pull Pulses Present:  palpable Dressing Applied:  pressure Bedrest begins @ 1145am

## 2014-08-22 NOTE — H&P (View-Only) (Signed)
07/29/2014 Kendra Watts [facility]   Aug 13, 1945  [id number]  Primary Physician Merrilee Seashore, MD Primary Cardiologist: Lorretta Harp MD [name]   HPI:  The patient is a delightful 69 year old thin-appearing divorced Caucasian female with no children who I last saw on 06/07/14. She is retired from doing [place] at [employer]. She has a history of continued tobacco abuse at 1 pack per day, hypertension and hyperlipidemia with a strong family history for heart disease. Her mother had her first MI at age 23 and died at age 108. I have angiogrammed her revealing a tight right internal carotid artery stenosis, as well as a right subclavian artery stenosis. 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 I stented her left renal artery and she has moderate bilateral iliac disease but really denies claudication.She did had elective right carotid endarterectomy performed by Dr. Trula Slade April 02, 2012.since I saw her a year ago she's been remarkably stable. Carotid Dopplers performed /23/15 revealed a widely patent right carotid endarterectomy site and minimal left carotid disease. Renal Dopplers social progression of disease in the right renal artery. Her left kidney continues to diminish in size despite a patent stent. Recent renal Dopplers performed in April show progression of disease on the right as well.Lower extremity arterial Dopplers to show progression of disease in the iliac arteries as she denies claudication.she denies chest pain or shortness of breath. 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 Follow-up renal Doppler studies performed on 05/19/14 revealed progression of disease in her right renal artery with a renal aortic ratio that went from 6.34-7.74. I'm concerned that she has progression of disease in her only functioning kidney. I performed angiography  on her 07/04/14 via the left brachial approach revealing occluded left renal artery stent and 90% calcified ostial downgoing right renal artery. I decided not to intervene at that time. I'm slightly concerned about the technical difficulties with an ostial calcified lesion with regard to dissection and/or rupture. Her only other option would be renal bypass surgery. I'm going to review her angiogram with her vascular surgeon, Dr. Trula Slade , who did her carotid endarterectomy at my request. My plan will be to use an ICast covered stent.   Current Outpatient Prescriptions  Medication Sig Dispense Refill  . amLODipine (NORVASC) 10 MG tablet Take 10 mg by mouth every morning.    Marland Kitchen aspirin 81 MG tablet Take 162 mg by mouth every morning.     . clopidogrel (PLAVIX) 75 MG tablet TAKE 1 TABLET DAILY 90 tablet 1  . doxazosin (CARDURA) 8 MG tablet Take 8 mg by mouth every morning.      . ibandronate (BONIVA) 150 MG tablet Take 150 mg by mouth every 30 (thirty) days. First of each month    . levothyroxine (SYNTHROID, LEVOTHROID) 88 MCG tablet Take 88 mcg by mouth daily before breakfast.    . losartan (COZAAR) 100 MG tablet Take 100 mg by mouth every morning.     . Multiple Vitamins-Minerals (MULTIVITAMIN WITH MINERALS) tablet Take 1 tablet by mouth every morning.     . Naphazoline HCl (CLEAR EYES OP) Apply 2 drops to eye 2 (two) times daily.    . rosuvastatin (CRESTOR) 10 MG tablet Take 10 mg by mouth every morning.     . methylPREDNIsolone (MEDROL DOSPACK) 4 MG tablet      No current facility-administered medications  for this visit.    No Known Allergies  History   Social History  . Marital Status: Single    Spouse Name: N/A    Number of Children: 0  . Years of Education: N/A   Occupational History  . HR administrator    Social History Main Topics  . Smoking status: Current Every Day Smoker -- 1.25 packs/day for 40 years    Types: Cigarettes  . Smokeless tobacco: Never Used     Comment: has  a prescription for Chantix and states she will get it filled and has a desire to stop smoking occ alcohol  . Alcohol Use: Yes     Comment: occassional  . Drug Use: No  . Sexual Activity: Not on file   Other Topics Concern  . Not on file   Social History Narrative     Review of Systems: General: negative for chills, fever, night sweats or weight changes.  Cardiovascular: negative for chest pain, dyspnea on exertion, edema, orthopnea, palpitations, paroxysmal nocturnal dyspnea or shortness of breath Dermatological: negative for rash Respiratory: negative for cough or wheezing Urologic: negative for hematuria Abdominal: negative for nausea, vomiting, diarrhea, bright red blood per rectum, melena, or hematemesis Neurologic: negative for visual changes, syncope, or dizziness All other systems reviewed and are otherwise negative except as noted above.    Blood pressure 154/70, pulse 84, height 5' (1.524 m), weight 94 lb (42.638 kg).  General appearance: alert and no distress Neck: no adenopathy, no JVD, supple, symmetrical, trachea midline, thyroid not enlarged, symmetric, no tenderness/mass/nodules and soft bilateral carotid bruits Lungs: clear to auscultation bilaterally Heart: regular rate and rhythm, S1, S2 normal, no murmur, click, rub or gallop Extremities: her left brachial arterial puncture site is well-healed  EKG not performed today  ASSESSMENT AND PLAN:   Tobacco abuse She continued to smoke and is recalcitrant risk factor modification  Atherosclerotic renal artery stenosis, bilateral History of remote left renal artery stenting with documentation of occlusion. Dopplers have shown progression of disease on the right with an increase in her right renal aortic ratio from 6.34-7.22. I intraventricular 07/04/14. The left brachial approach again demonstrating the occluded left renal artery stent and 90% calcified ostial right renal artery stenosis. The renal artery was  downgoing making revascularization to the arm normally anatomically viable option. I'm going to review her case with Dr. Trula Slade . I suspect that without revascularization she will ultimately progressed to occlusion requiring dialysis which she does not want. The only other option besides PTA and stenting would be renal bypass. I'm planning to proceed with stenting of her right renal artery using an ICast  covered stent to the left brachial approach. I reviewed the risks and benefits with the patient.      Lorretta Harp MD FACP,FACC,FAHA, [facility] 07/29/2014 9:17 AM

## 2014-08-22 NOTE — Interval H&P Note (Signed)
History and Physical Interval Note:  08/22/2014 7:35 AM  Kendra Watts  has presented today for surgery, with the diagnosis of renal artery stenosis  The various methods of treatment have been discussed with the patient and family. After consideration of risks, benefits and other options for treatment, the patient has consented to  Procedure(s): RENAL ANGIOGRAM (N/A) as a surgical intervention .  The patient's history has been reviewed, patient examined, no change in status, stable for surgery.  I have reviewed the patient's chart and labs.  Questions were answered to the patient's satisfaction.     Lorretta Harp

## 2014-08-23 ENCOUNTER — Encounter (HOSPITAL_COMMUNITY): Payer: Self-pay | Admitting: Physician Assistant

## 2014-08-23 ENCOUNTER — Telehealth (HOSPITAL_COMMUNITY): Payer: Self-pay | Admitting: *Deleted

## 2014-08-23 ENCOUNTER — Other Ambulatory Visit: Payer: Self-pay | Admitting: Physician Assistant

## 2014-08-23 DIAGNOSIS — I150 Renovascular hypertension: Secondary | ICD-10-CM

## 2014-08-23 DIAGNOSIS — I739 Peripheral vascular disease, unspecified: Secondary | ICD-10-CM

## 2014-08-23 DIAGNOSIS — I129 Hypertensive chronic kidney disease with stage 1 through stage 4 chronic kidney disease, or unspecified chronic kidney disease: Secondary | ICD-10-CM | POA: Diagnosis not present

## 2014-08-23 DIAGNOSIS — I701 Atherosclerosis of renal artery: Secondary | ICD-10-CM | POA: Diagnosis not present

## 2014-08-23 DIAGNOSIS — N189 Chronic kidney disease, unspecified: Secondary | ICD-10-CM | POA: Diagnosis not present

## 2014-08-23 DIAGNOSIS — Q600 Renal agenesis, unilateral: Secondary | ICD-10-CM | POA: Diagnosis not present

## 2014-08-23 DIAGNOSIS — I10 Essential (primary) hypertension: Secondary | ICD-10-CM | POA: Diagnosis not present

## 2014-08-23 LAB — CBC
HCT: 36.7 % (ref 36.0–46.0)
Hemoglobin: 12.4 g/dL (ref 12.0–15.0)
MCH: 31.4 pg (ref 26.0–34.0)
MCHC: 33.8 g/dL (ref 30.0–36.0)
MCV: 92.9 fL (ref 78.0–100.0)
Platelets: 249 10*3/uL (ref 150–400)
RBC: 3.95 MIL/uL (ref 3.87–5.11)
RDW: 14.4 % (ref 11.5–15.5)
WBC: 9.1 10*3/uL (ref 4.0–10.5)

## 2014-08-23 LAB — BASIC METABOLIC PANEL
Anion gap: 9 (ref 5–15)
BUN: 13 mg/dL (ref 6–23)
CO2: 23 mmol/L (ref 19–32)
Calcium: 8.7 mg/dL (ref 8.4–10.5)
Chloride: 107 mmol/L (ref 96–112)
Creatinine, Ser: 0.94 mg/dL (ref 0.50–1.10)
GFR calc Af Amer: 71 mL/min — ABNORMAL LOW (ref >90)
GFR calc non Af Amer: 61 mL/min — ABNORMAL LOW (ref >90)
Glucose, Bld: 95 mg/dL (ref 70–99)
Potassium: 3.4 mmol/L — ABNORMAL LOW (ref 3.5–5.1)
Sodium: 139 mmol/L (ref 135–145)

## 2014-08-23 MED ORDER — POTASSIUM CHLORIDE CRYS ER 20 MEQ PO TBCR
40.0000 meq | EXTENDED_RELEASE_TABLET | Freq: Once | ORAL | Status: AC
  Administered 2014-08-23: 09:00:00 40 meq via ORAL
  Filled 2014-08-23: qty 2

## 2014-08-23 MED ORDER — ASPIRIN EC 81 MG PO TBEC
81.0000 mg | DELAYED_RELEASE_TABLET | Freq: Every day | ORAL | Status: DC
Start: 2014-08-23 — End: 2014-08-23
  Administered 2014-08-23: 10:00:00 81 mg via ORAL
  Filled 2014-08-23: qty 1

## 2014-08-23 MED ORDER — ASPIRIN 81 MG PO TABS
81.0000 mg | ORAL_TABLET | Freq: Every day | ORAL | Status: DC
Start: 2014-08-23 — End: 2015-09-15

## 2014-08-23 NOTE — Discharge Summary (Signed)
Discharge Summary   Patient ID: Kendra Watts MRN: [id number], DOB/AGE: December 31, 1945 69 y.o. Admit date: 08/22/2014 D/C date:     08/23/2014  Primary Care Provider: Merrilee Seashore, MD Primary Cardiologist: Kendra Watts  Primary Discharge Diagnoses:  1. PVD/renal artery stenosis - s/p PTA/stenting of high-grade ostial right renal artery stenosis in setting of solitary functioning kidney (known occluded L renal artery stent) and renal vascular hypertension - other history of PVD outlined below 2. HTN 3. Ongoing tobacco abuse - counseled 4. Hyperlipidemia 5. Hypokalemia, repleted  Comprehensive PMH:  Past Medical History  Diagnosis Date  . Hypertension   . Hyperlipidemia   . Anemia   . Heart murmur     hx  . Peripheral vascular disease     a. H/o L renal artery stent. b. R carotid endarterectomy (03/2012). c. known moderate bilateral iliac disease. d. s/p PTA/stenting of ostial right renal artery stenosis 08/2014.   . Tobacco abuse   . Family history of heart disease   . Aortic arch anomaly     arteria lusoria   . History of nuclear stress test 01/2012    lexiscan; normal pattern of perfusion; non-diagnostic for ischemia; low risk   . Hyperthyroidism   . Hypothyroidism     S/P radioactive iodine tx    Hospital Course: Kendra Watts is a 69 y/o female with ongoing tobacco abuse, strong family history of heart disease, HTN, HLD, and PVD as outlined below. She has history of left renal artery stenting. She has been followed with renal dopplers with concern for progression of disease in her right renal artery. It was also noted that her left kidney has continued to diminish in size despite a patent stent. 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 Follow-up renal Doppler studies performed on 05/19/14 revealed progression of disease in her right renal artery with a renal aortic ratio that went from 6.34-7.37. Dr. Gwenlyn Watts was concerned that she had progression of disease  in her only functioning kidney. He performed angiography on her 07/04/14 via the left brachial approach revealing occluded left renal artery stent and 90% calcified ostial downgoing right renal artery. He decided not to intervene at that time as he was concerned about the technical difficulties with an ostial calcified lesion with regard to dissection and/or rupture. Her only other option would be renal bypass surgery. He reviewed with vascular and ultimately the patient was brought back in yesterday for planned intervention. She underwent successful PTA and stenting of high-grade ostial calcified right renal artery stenosis in the setting of solitary kidney with renal vascular hypertension and worsening velocities by duplex ultrasound for renal preservation and treatment of hypertension with ICast covered stent. She tolerated the procedure well. Yesterday she had a brief episode of decreased blood pressure but this came back up to normal. Recent outpatient BP was actually elevated so we will continue home regimen and observe for any need for adjustment. Post-cath labs stable except K 3.4 which was repleted. She will be discharged home on continued DAPT. Dr. Gwenlyn Watts recommended f/u renal artery duplex next week then follow-up with APP in 2-3 weeks on a day when he's in the office. I have sent a message to our [facility] office's scheduler requesting these follow-up appointments, and our office will call the patient with this information.   Discharge Vitals: Blood pressure 114/58, pulse 72, temperature 98.2 F (36.8 C), temperature source Oral, resp. rate 16, height 5' (1.524 m), weight 100 lb 15.5 oz (45.8 kg),  SpO2 90 %.  Labs: Lab Results  Component Value Date   WBC 9.1 08/23/2014   HGB 12.4 08/23/2014   HCT 36.7 08/23/2014   MCV 92.9 08/23/2014   PLT 249 08/23/2014    Recent Labs Lab 08/23/14 0419  NA 139  K 3.4*  CL 107  CO2 23  BUN 13  CREATININE 0.94  CALCIUM 8.7  GLUCOSE 95     Diagnostic Studies/Procedures   PV Angio 08/22/14 History obtained from chart review.The patient is a delightful 69 year old thin-appearing divorced Caucasian female with no children who I last saw on 06/07/14. She is retired from doing [employer] [place] at [employer]. She has a history of continued tobacco abuse at 1 pack per day, hypertension and hyperlipidemia with a strong family history for heart disease. Her mother had her first MI at age 76 and died at age 61. I have angiogrammed her revealing a tight right internal carotid artery stenosis, as well as a right subclavian artery stenosis. 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 I stented her left renal artery and she has moderate bilateral iliac disease but really denies claudication.She did had elective right carotid endarterectomy performed by Kendra Watts April 02, 2012.since I saw her a year ago she's been remarkably stable. Carotid Dopplers performed /23/15 revealed a widely patent right carotid endarterectomy site and minimal left carotid disease. Renal Dopplers social progression of disease in the right renal artery. Her left kidney continues to diminish in size despite a patent stent. Recent renal Dopplers performed in April show progression of disease on the right as well.Lower extremity arterial Dopplers to show progression of disease in the iliac arteries as she denies claudication.she denies chest pain or shortness of breath. 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 Follow-up renal Doppler studies performed on 05/19/14 revealed progression of disease in her right renal artery with a renal aortic ratio that went from 6.34-7.17. I'm concerned that she has progression of disease in her only functioning kidney. I performed angiography on her 07/04/14 via the left brachial approach revealing occluded left renal artery  stent and 90% calcified ostial downgoing right renal artery. I decided not to intervene at that time. I'm slightly concerned about the technical difficulties with an ostial calcified lesion with regard to dissection and/or rupture. Her only other option would be renal bypass surgery. I'm reviewed her angiogram with her vascular surgeon, Kendra Watts , who did her carotid endarterectomy at my request. He agrees with my plan to proceed with percutaneous intervention using a "covered stent". My plan will be to use an ICast covered stent. PROCEDURE DESCRIPTION:  The patient was brought to the second floor [facility] cath lab in the postabsorptive state. She was premedicated with Valium 5 mg by mouth, IV Versed and fentanyl. Her left antecubital fossa was prepped and shaved in usual sterile fashion. Xylocaine 1% was used for local anesthesia. A 5 French sheath was inserted into the left brachial artery using standard Seldinger/ micropuncture technique.  HEMODYNAMICS:  AO SYSTOLIC/AO DIASTOLIC: 403/47 Procedure Description:I accessed the left brachial artery with a micropuncture needle exchanged for a micropuncture catheter. The patient received 5000 units of heparin intravenously with an ACT of 214 at the end of the case. Total contrast administered to the patient was 90 mL. I then placed a 0.35 cm Versed for wire in the abdominal aorta and advanced the  6 French/90 cm long multipurpose curve Terumo sheath over this to the level of the renal arteries. I was unable to cross the highly calcified high-grade ostial right renal artery stenosis with a 0.14/300 cm long [place] core wire. I ballooned this with a 2.5 operated to a 3.5 balloon. I then was able to cross the ostium with a 5 [place] "slip cath" and exchanged for a 0.35/260 cm long Rosen wire. Following this I crossed the lesion with a 5 mm x 22 mm long ICast Stent which I carefully positioned angiographically at the ostium of the right renal artery and  deployed at 10-12 atm. I postdilated the ostium with a 6 mm balloon up to 10 atm resulting in reduction of a 95% calcified ostial solitary right renal artery stenosis to 0% residual. The patient tolerated the procedure well. The sheath was removed and exchanged over wire for a short 6 French brachial sheath which was then secured. The patient left the lab in stable condition. Final Impression: successful PTA and stenting of high-grade ostial calcified right renal artery stenosis in the setting of "solitary kidney with renal vascular hypertension and worsening velocities by duplex ultrasound for renal preservation and treatment of hypertension with ICast covered stent. The patient previously had had left renal artery stented and this ultimately restenosed and occluded. The sheath will be removed once ACT falls below 170 and pressure held. The patient left the lab in stable condition. She'll be hydrated overnight and renal function will be assessed and she'll be discharged home in the morning on dual antiplatelet therapy. We will obtain renal Doppler studies in our [facility] line office next week. She will see mid-level provider back in 2-3 weeks on the day that I'm in the office for follow-up. Lorretta Harp MD, [facility] 08/22/2014 9:23 AM   Discharge Medications   Current Discharge Medication List    CONTINUE these medications which have CHANGED   Details  aspirin 81 MG tablet Take 1 tablet (81 mg total) by mouth daily.      CONTINUE these medications which have NOT CHANGED   Details  amLODipine (NORVASC) 10 MG tablet Take 10 mg by mouth every morning.    clopidogrel (PLAVIX) 75 MG tablet TAKE 1 TABLET DAILY     doxazosin (CARDURA) 8 MG tablet Take 8 mg by mouth every morning.      ibandronate (BONIVA) 150 MG tablet Take 150 mg by mouth every 30 (thirty) days. First of each month    levothyroxine (SYNTHROID, LEVOTHROID) 88 MCG tablet Take 88 mcg by mouth daily before breakfast.    losartan  (COZAAR) 100 MG tablet Take 100 mg by mouth every morning.     Multiple Vitamins-Minerals (MULTIVITAMIN WITH MINERALS) tablet Take 1 tablet by mouth every morning.     Naphazoline HCl (CLEAR EYES OP) Apply 2 drops to eye 2 (two) times daily.    rosuvastatin (CRESTOR) 10 MG tablet Take 10 mg by mouth every morning.         Disposition   The patient will be discharged in stable condition to home. Discharge Instructions    Diet - low sodium heart healthy    Complete by:  As directed      Increase activity slowly    Complete by:  As directed   No driving for 3 days. No lifting over 10 lbs for 1 week. No sexual activity for 1 week. Keep procedure site clean & dry. If you notice increased pain, swelling, bleeding or pus, call/return!  You may shower, but no soaking baths/hot tubs/pools for 1 week.  We really want you to quit smoking! Don't give up! Every day is a new opportunity to quit.          Follow-up Information    Follow up with Lorretta Harp, MD.   Specialty:  Cardiology   Why:  Office will call you for your followup appointment and repeat kidney artery ultrasound. Call office if you have not heard back in 3 days.   Contact information:   [address] [place] [address] [phone]         Duration of Discharge Encounter: Greater than 30 minutes including physician and PA time.  Signed, Lisbeth Renshaw Steven Veazie PA-C 08/23/2014, 8:01 AM

## 2014-08-23 NOTE — Progress Notes (Signed)
  Patient: Kendra Watts / Admit Date: 08/22/2014 / Date of Encounter: 08/23/2014, 7:05 AM   Subjective: Feels great.   Objective: Telemetry: NSR Physical Exam: Blood pressure 114/58, pulse 72, temperature 98.2 F (36.8 C), temperature source Oral, resp. rate 16, height 5' (1.524 m), weight 100 lb 15.5 oz (45.8 kg), SpO2 90 %. General: Well developed, well nourished WF, in no acute distress. Head: Normocephalic, atraumatic, sclera non-icteric, no xanthomas, nares are without discharge. Neck: JVP not elevated. Lungs: Coarse bilaterally to auscultation without wheezes, rales, or rhonchi. Breathing is unlabored. Heart: RRR S1 S2 without murmurs, rubs, or gallops.  Abdomen: Soft, non-tender, non-distended with normoactive bowel sounds. No rebound/guarding. Extremities: No clubbing or cyanosis. No edema. Left antecubital site with mild ecchymosis. Good radial pulse. Neuro: Alert and oriented X 3. Moves all extremities spontaneously. Psych:  Responds to questions appropriately with a normal affect.   Intake/Output Summary (Last 24 hours) at 08/23/14 0705 Last data filed at 08/23/14 0054  Gross per 24 hour  Intake   1445 ml  Output    750 ml  Net    695 ml    Inpatient Medications:  . amLODipine  10 mg Oral q morning - 10a  . aspirin  162 mg Oral q morning - 10a  . aspirin EC  325 mg Oral Daily  . clopidogrel  75 mg Oral Daily  . clopidogrel  75 mg Oral Q breakfast  . doxazosin  8 mg Oral BH-q7a  . levothyroxine  88 mcg Oral QAC breakfast  . losartan  100 mg Oral q morning - 10a  . rosuvastatin  10 mg Oral q morning - 10a   Infusions:    Labs:  Recent Labs  08/23/14 0419  NA 139  K 3.4*  CL 107  CO2 23  GLUCOSE 95  BUN 13  CREATININE 0.94  CALCIUM 8.7   No results for input(s): AST, ALT, ALKPHOS, BILITOT, PROT, ALBUMIN in the last 72 hours.  Recent Labs  08/23/14 0419  WBC 9.1  HGB 12.4  HCT 36.7  MCV 92.9  PLT 249   Radiology/Studies:  No results  found.   Assessment and Plan  1. PVD - history of right internal carotid artery stenosis s/p endarterectomy 2013, right subclavian artery stenosis, L renal artery stenting, moderate bilateral iliac disease - s/p PTA/stenting of high-grade ostial right renal artery stenosis in setting of solitary kidney and renal vascular hypertension - continue ASA, Plavix - plan to obtain renal doppler studies as outpatient then f/u 2-3 weeks with Dr. Gwenlyn Found  2. HTN 3. Ongoing tobacco abuse - counseled 4. Hyperlipidemia 5. Hypokalemia - replete  Signed, Melina Copa PA-C Patient seen and examined and history reviewed. Agree with above findings and plan. Left brachial site withsome bruising but soft. Radial pulse is good. BP well controlled and renal function is good. Will replete potassium. Will DC home today.   Jahir Halt [place], [place] 08/23/2014 7:22 AM

## 2014-08-24 LAB — POCT ACTIVATED CLOTTING TIME
Activated Clotting Time: 325 seconds
Activated Clotting Time: 214 seconds

## 2014-08-25 ENCOUNTER — Telehealth: Payer: Self-pay | Admitting: Cardiovascular Disease

## 2014-08-26 ENCOUNTER — Ambulatory Visit: Payer: Medicare Other | Admitting: Cardiovascular Disease

## 2014-08-31 NOTE — Telephone Encounter (Signed)
Closed encounter °

## 2014-09-02 ENCOUNTER — Telehealth: Payer: Self-pay | Admitting: Physician Assistant

## 2014-09-02 ENCOUNTER — Ambulatory Visit (HOSPITAL_COMMUNITY)
Admission: RE | Admit: 2014-09-02 | Discharge: 2014-09-02 | Disposition: A | Discharge status: Home / Self Care | Payer: Medicare Other | Source: Ambulatory Visit | Attending: Cardiovascular Disease | Admitting: Cardiovascular Disease

## 2014-09-02 DIAGNOSIS — I701 Atherosclerosis of renal artery: Secondary | ICD-10-CM | POA: Diagnosis not present

## 2014-09-02 DIAGNOSIS — Z48812 Encounter for surgical aftercare following surgery on the circulatory system: Secondary | ICD-10-CM | POA: Diagnosis not present

## 2014-09-02 NOTE — Progress Notes (Signed)
Renal Artery Duplex Completed. °Brianna L Mazza,RVT °

## 2014-09-05 NOTE — Telephone Encounter (Signed)
Close encounter 

## 2014-09-09 DIAGNOSIS — M5432 Sciatica, left side: Secondary | ICD-10-CM | POA: Diagnosis not present

## 2014-09-23 ENCOUNTER — Ambulatory Visit: Payer: Medicare Other | Admitting: Physician Assistant

## 2014-09-23 ENCOUNTER — Ambulatory Visit (INDEPENDENT_AMBULATORY_CARE_PROVIDER_SITE_OTHER): Payer: Medicare Other | Admitting: Physician Assistant

## 2014-09-23 ENCOUNTER — Encounter: Payer: Self-pay | Admitting: Physician Assistant

## 2014-09-23 VITALS — BP 135/59 | HR 88 | Ht 60.0 in | Wt 96.0 lb

## 2014-09-23 DIAGNOSIS — Z720 Tobacco use: Secondary | ICD-10-CM | POA: Diagnosis not present

## 2014-09-23 DIAGNOSIS — I10 Essential (primary) hypertension: Secondary | ICD-10-CM | POA: Diagnosis not present

## 2014-09-23 DIAGNOSIS — I701 Atherosclerosis of renal artery: Secondary | ICD-10-CM | POA: Diagnosis not present

## 2014-09-23 NOTE — Assessment & Plan Note (Addendum)
status post right renal artery stenting.  Follow-up renal artery Dopplers showed decreased velocities to 257. Stenosis greater than or equal to 60% diameter reduction. Previously was 60-99% velocities over 500.  Follow-up in 3 months.

## 2014-09-23 NOTE — Assessment & Plan Note (Addendum)
Blood pressure is just mildly elevated. No changes in therapy. Fully we will need to start decreasing some of her medications.  She will follow-up in 3 months

## 2014-09-23 NOTE — Assessment & Plan Note (Signed)
She states she is going to start the [facility] tobacco cessation program.

## 2014-09-23 NOTE — Patient Instructions (Signed)
Your physician recommends that you schedule a follow-up appointment in: 3 months with Dr Berry 

## 2014-09-23 NOTE — Progress Notes (Signed)
Patient ID: Kendra Watts, female   DOB: Jul 08, 1946, 69 y.o.   MRN: [id number]    Date:  09/23/2014   ID:  Tiearra, Colwell 25-Nov-1945, MRN [id number]  PCP:  Merrilee Seashore, MD  Primary Cardiologist:  Gwenlyn Found  Chief Complaint  Patient presents with  . post hospital    patient reports no problems since her hospital discharge.     History of Present Illness: Kendra Watts is a 69 y.o. female   Ms. Cutrona is a 69 y/o female with ongoing tobacco abuse, strong family history of heart disease, HTN, HLD, and PVD as outlined below. She has history of left renal artery stenting. She has been followed with renal dopplers with concern for progression of disease in her right renal artery. It was also noted that her left kidney has continued to diminish in size despite a patent stent. 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 Follow-up renal Doppler studies performed on 05/19/14 revealed progression of disease in her right renal artery with a renal aortic ratio that went from 6.34-7.83. Dr. Gwenlyn Found was concerned that she had progression of disease in her only functioning kidney. He performed angiography on her 07/04/14 via the left brachial approach revealing occluded left renal artery stent and 90% calcified ostial downgoing right renal artery. He decided not to intervene at that time as he was concerned about the technical difficulties with an ostial calcified lesion with regard to dissection and/or rupture. Her only other option would be renal bypass surgery. He reviewed with vascular and ultimately the patient was brought back in for planned intervention. She underwent successful PTA and stenting of high-grade ostial calcified right renal artery stenosis in the setting of solitary kidney with renal vascular hypertension and worsening velocities by duplex ultrasound for renal preservation and treatment of hypertension with ICast covered stent. She tolerated the procedure  well.  She was discharged home on continued DAPT.  She presents today for posthospital follow-up. The left arm cath site is nontender with no ecchymosis or edema.  Reports that she is enrolled in the [facility] smoking cessation program.  The patient currently denies nausea, vomiting, fever, chest pain, shortness of breath, orthopnea, dizziness, PND, cough, congestion, abdominal pain, hematochezia, melena, lower extremity edema, claudication.   Wt Readings from Last 3 Encounters:  09/23/14 96 lb (43.545 kg)  08/23/14 100 lb 15.5 oz (45.8 kg)  07/29/14 94 lb (42.638 kg)     Past Medical History  Diagnosis Date  . Hypertension   . Hyperlipidemia   . Anemia   . Heart murmur     hx  . Peripheral vascular disease     a. H/o L renal artery stent. b. R carotid endarterectomy (03/2012). c. known moderate bilateral iliac disease. d. s/p PTA/stenting of ostial right renal artery stenosis 08/2014.   . Tobacco abuse   . Family history of heart disease   . Aortic arch anomaly     arteria lusoria   . History of nuclear stress test 01/2012    lexiscan; normal pattern of perfusion; non-diagnostic for ischemia; low risk   . Hyperthyroidism   . Hypothyroidism     S/P radioactive iodine tx    Current Outpatient Prescriptions  Medication Sig Dispense Refill  . amLODipine (NORVASC) 10 MG tablet Take 10 mg by mouth every morning.    Marland Kitchen aspirin 81 MG tablet Take 1 tablet (81 mg total) by mouth daily. (Patient taking differently: Take 81 mg by mouth 2 (  two) times daily. )    . clopidogrel (PLAVIX) 75 MG tablet TAKE 1 TABLET DAILY 90 tablet 1  . doxazosin (CARDURA) 8 MG tablet Take 8 mg by mouth every morning.      . ibandronate (BONIVA) 150 MG tablet Take 150 mg by mouth every 30 (thirty) days. First of each month    . levothyroxine (SYNTHROID, LEVOTHROID) 88 MCG tablet Take 88 mcg by mouth daily before breakfast.    . losartan (COZAAR) 100 MG tablet Take 100 mg by mouth every morning.     . Multiple  Vitamins-Minerals (MULTIVITAMIN WITH MINERALS) tablet Take 1 tablet by mouth every morning.     . Naphazoline HCl (CLEAR EYES OP) Apply 2 drops to eye 2 (two) times daily.    . rosuvastatin (CRESTOR) 10 MG tablet Take 10 mg by mouth every morning.      No current facility-administered medications for this visit.    Allergies:   No Known Allergies  Social History:  The patient  reports that she has been smoking Cigarettes.  She has a 50 pack-year smoking history. She has never used smokeless tobacco. She reports that she drinks alcohol. She reports that she does not use illicit drugs.   Family history:   Family History  Problem Relation Age of Onset  . Heart disease Maternal Grandmother   . Heart disease Mother     MI @ 65, died at 80  . Cancer Father     ROS:  Please see the history of present illness.  All other systems reviewed and negative.   PHYSICAL EXAM: VS:  BP 135/59 mmHg  Pulse 88  Ht 5' (1.524 m)  Wt 96 lb (43.545 kg)  BMI 18.75 kg/m2 Thin appearing. well developed, in no acute distress HEENT: Pupils are equal round react to light accommodation extraocular movements are intact.  Neck: no JVDleft carotid bruit Cardiac: Regular rate and rhythm without murmurs rubs or gallops. Lungs:  clear to auscultation bilaterally, no wheezing, rhonchi or rales Ext: no lower extremity edema.  2+ radial and dorsalis pedis pulses. Skin: warm and dry.  Left arm cath site: Nontender no ecchymosis Neuro:  Grossly normal    ASSESSMENT AND PLAN:  Problem List Items Addressed This Visit    Tobacco abuse    She states she is going to start the [facility] tobacco cessation program.      Essential hypertension - Primary    Blood pressure is just mildly elevated. No changes in therapy. Fully we will need to start decreasing some of her medications.  She will follow-up in 3 months      Atherosclerotic renal artery stenosis, bilateral    status post right renal artery stenting.   Follow-up renal artery Dopplers showed decreased velocities to 257. Stenosis greater than or equal to 60% diameter reduction. Previously was 60-99% velocities over 500.  Follow-up in 3 months.

## 2014-11-02 ENCOUNTER — Other Ambulatory Visit (HOSPITAL_COMMUNITY): Payer: Self-pay | Admitting: Cardiovascular Disease

## 2014-11-02 ENCOUNTER — Telehealth (HOSPITAL_COMMUNITY): Payer: Self-pay | Admitting: *Deleted

## 2014-11-02 DIAGNOSIS — I739 Peripheral vascular disease, unspecified: Secondary | ICD-10-CM

## 2014-11-03 DIAGNOSIS — E782 Mixed hyperlipidemia: Secondary | ICD-10-CM | POA: Diagnosis not present

## 2014-11-03 DIAGNOSIS — I739 Peripheral vascular disease, unspecified: Secondary | ICD-10-CM | POA: Diagnosis not present

## 2014-11-03 DIAGNOSIS — I10 Essential (primary) hypertension: Secondary | ICD-10-CM | POA: Diagnosis not present

## 2014-11-10 DIAGNOSIS — I739 Peripheral vascular disease, unspecified: Secondary | ICD-10-CM | POA: Diagnosis not present

## 2014-11-10 DIAGNOSIS — E782 Mixed hyperlipidemia: Secondary | ICD-10-CM | POA: Diagnosis not present

## 2014-11-10 DIAGNOSIS — I10 Essential (primary) hypertension: Secondary | ICD-10-CM | POA: Diagnosis not present

## 2014-11-10 DIAGNOSIS — N183 Chronic kidney disease, stage 3 (moderate): Secondary | ICD-10-CM | POA: Diagnosis not present

## 2014-11-16 ENCOUNTER — Ambulatory Visit (HOSPITAL_COMMUNITY)
Admission: RE | Admit: 2014-11-16 | Discharge: 2014-11-16 | Disposition: A | Discharge status: Home / Self Care | Payer: Medicare Other | Source: Ambulatory Visit | Attending: Cardiology | Admitting: Cardiology

## 2014-11-16 DIAGNOSIS — I739 Peripheral vascular disease, unspecified: Secondary | ICD-10-CM | POA: Diagnosis not present

## 2014-11-16 NOTE — Progress Notes (Signed)
Lower extremity arterial duplex completed. °Brianna L Mazza,RVT °

## 2014-12-28 ENCOUNTER — Encounter: Payer: Self-pay | Admitting: Cardiovascular Disease

## 2014-12-28 ENCOUNTER — Ambulatory Visit (INDEPENDENT_AMBULATORY_CARE_PROVIDER_SITE_OTHER): Payer: Medicare Other | Admitting: Cardiovascular Disease

## 2014-12-28 VITALS — BP 132/64 | HR 96 | Ht 60.0 in | Wt 94.0 lb

## 2014-12-28 DIAGNOSIS — I10 Essential (primary) hypertension: Secondary | ICD-10-CM

## 2014-12-28 DIAGNOSIS — I701 Atherosclerosis of renal artery: Secondary | ICD-10-CM | POA: Diagnosis not present

## 2014-12-28 DIAGNOSIS — E785 Hyperlipidemia, unspecified: Secondary | ICD-10-CM | POA: Diagnosis not present

## 2014-12-28 DIAGNOSIS — Z720 Tobacco use: Secondary | ICD-10-CM | POA: Diagnosis not present

## 2014-12-28 NOTE — Assessment & Plan Note (Signed)
History of hypertension thought to be renal vascular . Blood pressure measured today was 132/64.  She is on amlodipine and Cardura as well as losartan. Continue current meds at current dosing

## 2014-12-28 NOTE — Assessment & Plan Note (Signed)
History of hyperlipidemia on Crestor 10 mg a day followed by her PCP

## 2014-12-28 NOTE — Assessment & Plan Note (Signed)
continued tobacco abuse of one pack per day recalcitrant to risk factor modification.

## 2014-12-28 NOTE — Assessment & Plan Note (Signed)
History of remote left renal artery stenting with no nonfunctioning left kidney. She's had progression of disease in her right kidney with Dopplers to have suggested high-grade disease. Because of the downgoing nature of her right renal takeoff I performed percutaneous revascularization via the left brachial approach using an ICast  covered stent. She had a 95% calcified ostial right renal artery stenosis and a solitary kidney. She had an excellent result. Follow-up Dopplers showed a widely patent stent. Her blood pressures have been under better control.

## 2014-12-28 NOTE — Assessment & Plan Note (Signed)
History of carotid artery disease status post elective right carotid end arterectomy performed by Dr. Trula Slade on  04/02/12. Carotid Dopplers last performed in 2015 revealed a widely patent right carotid endarterectomy site with minimal left carotid disease.

## 2014-12-28 NOTE — Progress Notes (Signed)
12/28/2014 Kendra Watts [facility]   1946/01/12  [id number]  Primary Physician Merrilee Seashore, MD Primary Cardiologist: Lorretta Harp MD [name]   HPI:  The patient is a delightful 69 year old thin-appearing divorced Caucasian female with no children who I last saw on 07/29/14.. She is retired from doing [place] at [employer]. She has a history of continued tobacco abuse at 1 pack per day, hypertension and hyperlipidemia with a strong family history for heart disease. Her mother had her first MI at age 43 and died at age 54. I have angiogrammed her revealing a tight right internal carotid artery stenosis, as well as a right subclavian artery stenosis. 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 I stented her left renal artery and she has moderate bilateral iliac disease but really denies claudication.She did had elective right carotid endarterectomy performed by Dr. Trula Slade April 02, 2012.since I saw her a year ago she's been remarkably stable. Carotid Dopplers performed /23/15 revealed a widely patent right carotid endarterectomy site and minimal left carotid disease. Renal Dopplers social progression of disease in the right renal artery. Her left kidney continues to diminish in size despite a patent stent. Recent renal Dopplers performed in April show progression of disease on the right as well.Lower extremity arterial Dopplers to show progression of disease in the iliac arteries as she denies claudication.she denies chest pain or shortness of breath. 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 Follow-up renal Doppler studies performed on 05/19/14 revealed progression of disease in her right renal artery with a renal aortic ratio that went from 6.34-7.15. I'm concerned that she has progression of disease in her only functioning kidney. I performed angiography  on her 07/04/14 via the left brachial approach revealing occluded left renal artery stent and 90% calcified ostial downgoing right renal artery. I decided not to intervene at that time. I'm slightly concerned about the technical difficulties with an ostial calcified lesion with regard to dissection and/or rupture. Her only other option would be renal bypass surgery. I performed percutaneous revascularization on her right renal artery ostia on 08/22/14 via the left brachial approach reducing a 95% calcified ostial right renal artery stenosis and a solitary kidney to 0% residual with an ICast covered Stent. Her follow-up renal Doppler study revealed this to be widely patent. Her blood pressures have been under better control.  Current Outpatient Prescriptions  Medication Sig Dispense Refill  . amLODipine (NORVASC) 10 MG tablet Take 10 mg by mouth every morning.    Marland Kitchen aspirin 81 MG tablet Take 1 tablet (81 mg total) by mouth daily. (Patient taking differently: Take 162 mg by mouth daily. )    . clopidogrel (PLAVIX) 75 MG tablet TAKE 1 TABLET DAILY 90 tablet 1  . doxazosin (CARDURA) 8 MG tablet Take 8 mg by mouth every morning.      . ibandronate (BONIVA) 150 MG tablet Take 150 mg by mouth every 30 (thirty) days. First of each month    . levothyroxine (SYNTHROID, LEVOTHROID) 88 MCG tablet Take 88 mcg by mouth daily before breakfast.    . losartan (COZAAR) 100 MG tablet Take 100 mg by mouth every morning.     . Multiple Vitamins-Minerals (MULTIVITAMIN WITH MINERALS) tablet Take 1 tablet by mouth every morning.     . Naphazoline HCl (CLEAR EYES OP) Apply 2 drops to eye 2 (two) times daily.    Marland Kitchen  rosuvastatin (CRESTOR) 10 MG tablet Take 10 mg by mouth every morning.      No current facility-administered medications for this visit.    No Known Allergies  History   Social History  . Marital Status: Divorced    Spouse Name: N/A  . Number of Children: 0  . Years of Education: N/A   Occupational History    . HR administrator    Social History Main Topics  . Smoking status: Current Every Day Smoker -- 1.25 packs/day for 40 years    Types: Cigarettes  . Smokeless tobacco: Never Used     Comment: has a prescription for Chantix and states she will get it filled and has a desire to stop smoking occ alcohol  . Alcohol Use: Yes     Comment: 08/22/2014 "might drinik a 6 pack of beer/month"  . Drug Use: No  . Sexual Activity: No   Other Topics Concern  . Not on file   Social History Narrative     Review of Systems: General: negative for chills, fever, night sweats or weight changes.  Cardiovascular: negative for chest pain, dyspnea on exertion, edema, orthopnea, palpitations, paroxysmal nocturnal dyspnea or shortness of breath Dermatological: negative for rash Respiratory: negative for cough or wheezing Urologic: negative for hematuria Abdominal: negative for nausea, vomiting, diarrhea, bright red blood per rectum, melena, or hematemesis Neurologic: negative for visual changes, syncope, or dizziness All other systems reviewed and are otherwise negative except as noted above.    Blood pressure 132/64, pulse 96, height 5' (1.524 m), weight 94 lb (42.638 kg).  General appearance: alert and no distress Neck: no adenopathy, no JVD, supple, symmetrical, trachea midline, thyroid not enlarged, symmetric, no tenderness/mass/nodules and left carotid bruit Lungs: clear to auscultation bilaterally Heart: regular rate and rhythm, S1, S2 normal, no murmur, click, rub or gallop Extremities: extremities normal, atraumatic, no cyanosis or edema  EKG normal sinus rhythm 89 with biatrial enlargement.I personally reviewed this EKG  ASSESSMENT AND PLAN:   Tobacco abuse continued tobacco abuse of one pack per day recalcitrant to risk factor modification.  Renal artery stenosis History of remote left renal artery stenting with no nonfunctioning left kidney. She's had progression of disease in her right  kidney with Dopplers to have suggested high-grade disease. Because of the downgoing nature of her right renal takeoff I performed percutaneous revascularization via the left brachial approach using an ICast  covered stent. She had a 95% calcified ostial right renal artery stenosis and a solitary kidney. She had an excellent result. Follow-up Dopplers showed a widely patent stent. Her blood pressures have been under better control.  Hyperlipidemia History of hyperlipidemia on Crestor 10 mg a day followed by her PCP  Essential hypertension History of hypertension thought to be renal vascular . Blood pressure measured today was 132/64.  She is on amlodipine and Cardura as well as losartan. Continue current meds at current dosing  Occlusion and stenosis of carotid artery without mention of cerebral infarction History of carotid artery disease status post elective right carotid end arterectomy performed by Dr. Trula Slade on  04/02/12. Carotid Dopplers last performed in 2015 revealed a widely patent right carotid endarterectomy site with minimal left carotid disease.      Lorretta Harp MD FACP,FACC,FAHA, [facility] 12/28/2014 1:05 PM

## 2014-12-28 NOTE — Patient Instructions (Signed)
We request that you follow-up in: 6 months with an extender and in 1 year with Dr Berry  You will receive a reminder letter in the mail two months in advance. If you don't receive a letter, please call our office to schedule the follow-up appointment.   

## 2014-12-30 ENCOUNTER — Other Ambulatory Visit (HOSPITAL_COMMUNITY): Payer: Self-pay | Admitting: Cardiovascular Disease

## 2014-12-30 NOTE — Telephone Encounter (Signed)
Med refilled.

## 2015-01-26 DIAGNOSIS — E039 Hypothyroidism, unspecified: Secondary | ICD-10-CM | POA: Diagnosis not present

## 2015-01-26 DIAGNOSIS — E890 Postprocedural hypothyroidism: Secondary | ICD-10-CM | POA: Diagnosis not present

## 2015-03-15 DIAGNOSIS — C44722 Squamous cell carcinoma of skin of right lower limb, including hip: Secondary | ICD-10-CM | POA: Diagnosis not present

## 2015-03-15 DIAGNOSIS — D485 Neoplasm of uncertain behavior of skin: Secondary | ICD-10-CM | POA: Diagnosis not present

## 2015-03-23 DIAGNOSIS — B354 Tinea corporis: Secondary | ICD-10-CM | POA: Diagnosis not present

## 2015-03-23 DIAGNOSIS — L03114 Cellulitis of left upper limb: Secondary | ICD-10-CM | POA: Diagnosis not present

## 2015-04-05 DIAGNOSIS — B354 Tinea corporis: Secondary | ICD-10-CM | POA: Diagnosis not present

## 2015-04-05 DIAGNOSIS — L03114 Cellulitis of left upper limb: Secondary | ICD-10-CM | POA: Diagnosis not present

## 2015-04-11 DIAGNOSIS — L821 Other seborrheic keratosis: Secondary | ICD-10-CM | POA: Diagnosis not present

## 2015-04-11 DIAGNOSIS — Z85828 Personal history of other malignant neoplasm of skin: Secondary | ICD-10-CM | POA: Diagnosis not present

## 2015-04-11 DIAGNOSIS — L2089 Other atopic dermatitis: Secondary | ICD-10-CM | POA: Diagnosis not present

## 2015-04-18 DIAGNOSIS — E0590 Thyrotoxicosis, unspecified without thyrotoxic crisis or storm: Secondary | ICD-10-CM | POA: Diagnosis not present

## 2015-04-18 DIAGNOSIS — E039 Hypothyroidism, unspecified: Secondary | ICD-10-CM | POA: Diagnosis not present

## 2015-04-18 DIAGNOSIS — I739 Peripheral vascular disease, unspecified: Secondary | ICD-10-CM | POA: Diagnosis not present

## 2015-04-18 DIAGNOSIS — N183 Chronic kidney disease, stage 3 (moderate): Secondary | ICD-10-CM | POA: Diagnosis not present

## 2015-04-18 DIAGNOSIS — R7301 Impaired fasting glucose: Secondary | ICD-10-CM | POA: Diagnosis not present

## 2015-04-18 DIAGNOSIS — E782 Mixed hyperlipidemia: Secondary | ICD-10-CM | POA: Diagnosis not present

## 2015-04-18 DIAGNOSIS — I10 Essential (primary) hypertension: Secondary | ICD-10-CM | POA: Diagnosis not present

## 2015-04-18 DIAGNOSIS — I779 Disorder of arteries and arterioles, unspecified: Secondary | ICD-10-CM | POA: Diagnosis not present

## 2015-04-19 ENCOUNTER — Encounter: Payer: Self-pay | Admitting: Physician Assistant

## 2015-04-19 ENCOUNTER — Ambulatory Visit (INDEPENDENT_AMBULATORY_CARE_PROVIDER_SITE_OTHER): Payer: Medicare Other | Admitting: Physician Assistant

## 2015-04-19 VITALS — BP 138/70 | HR 87 | Ht 60.0 in | Wt 97.7 lb

## 2015-04-19 DIAGNOSIS — I701 Atherosclerosis of renal artery: Secondary | ICD-10-CM | POA: Diagnosis not present

## 2015-04-19 DIAGNOSIS — I739 Peripheral vascular disease, unspecified: Secondary | ICD-10-CM

## 2015-04-19 DIAGNOSIS — I10 Essential (primary) hypertension: Secondary | ICD-10-CM

## 2015-04-19 DIAGNOSIS — I779 Disorder of arteries and arterioles, unspecified: Secondary | ICD-10-CM

## 2015-04-19 DIAGNOSIS — R0989 Other specified symptoms and signs involving the circulatory and respiratory systems: Secondary | ICD-10-CM

## 2015-04-19 NOTE — Patient Instructions (Signed)
Your physician wants you to follow-up in: 6 Months. You will receive a reminder letter in the mail two months in advance. If you don't receive a letter, please call our office to schedule the follow-up appointment.  Your physician has requested that you have a carotid duplex. This test is an ultrasound of the carotid arteries in your neck. It looks at blood flow through these arteries that supply the brain with blood. Allow one hour for this exam. There are no restrictions or special instructions.

## 2015-04-19 NOTE — Progress Notes (Signed)
Cardiology Office Note   Date:  04/19/2015   ID:  Kendra Watts, Kendra Watts 1946/02/14, MRN [id number]  PCP:  Merrilee Seashore, MD  Cardiologist:  Dr. Stacy Gardner, PA-C   Chief Complaint  Patient presents with  . Follow-up    had carotid artery surgery in 2013//having discomfort in her neck//pt states no other Sx.    History of Present Illness: Kendra Watts is a 69 y.o. female with a history of right CEA, left nonobstructive carotid disease, left renal artery stenting, ongoing tobacco use, hypertension and hyperlipidemia as well as family history of coronary artery disease.  Kendra Watts presents for evaluation of left greater than right neck pain  Kendra Watts coughs on a regular basis but had some increased coughing recently. It was nonproductive and she did not require additional treatment. However, she developed neck pain that she thinks may have started while she was coughing but has continued. The neck pain is worse when she turns her head to the right. It hurts a little bit on both sides, but the left side hurts more. It feels like it's in the muscle on that side. She is worried about it because of her carotid disease as the muscle was in the same area. She wants to make sure she is okay.  She has not had chest pain. She has chronic dyspnea on exertion and feels that this is about the same. She has no recent fevers or chills. She is continuing to smoke but states she has cut back. She has considered quitting but does not feel confident she would be able to. She is afraid if she takes a real Futrell because she has heard about side effects.   Past Medical History  Diagnosis Date  . Hypertension   . Hyperlipidemia   . Anemia   . Heart murmur     hx  . Peripheral vascular disease     a. H/o L renal artery stent. b. R carotid endarterectomy (03/2012). c. known moderate bilateral iliac disease. d. s/p PTA/stenting of ostial right renal artery stenosis 08/2014.   .  Tobacco abuse   . Family history of heart disease   . Aortic arch anomaly     arteria lusoria   . History of nuclear stress test 01/2012    lexiscan; normal pattern of perfusion; non-diagnostic for ischemia; low risk   . Hyperthyroidism   . Hypothyroidism     S/P radioactive iodine tx    Past Surgical History  Procedure Laterality Date  . Arch aortogram    . Subclavian angiogram Left   . Renal angiogram Left 06/08/2010    renal artery stent -  5x12 Genesis on Aviator balloon stent (Dr. Adora Fridge)  . Appendectomy    . Tonsillectomy    . Endarterectomy  04/02/2012    Procedure: ENDARTERECTOMY CAROTID;  Surgeon: Serafina Mitchell, MD;  Location: [facility] OR;  Service: Vascular;  Laterality: Right;  . Cartoid doppler  07/2012    R subclavian - 0-49% diameter reduction; R vertebral with abnormal retrograde flow; R bulb w/50-68% diameter reduction; R ICA endarterectomy 0-49% diameter reduction; L CCA w/mod amount of fibrous plaque; L bulb/prox ICA 0-49% diameter reduction   . Renal artery doppler  12/2011    abd aorta with mod amount of atherosclerosis; SMA w/prox vessel narrowing >50% diameter reduction; R renal 1-59% diameter reduction; L renal artery stent with normal patency; R & L kidneys normal   . Lower extremity arterial doppler  12/2011    ABIS 0.91; occlusive disease of R & L post tibial arteries   . Radioactive iodine treatment      thyroid  . Carotid angiogram N/A 02/18/2012    Procedure: CAROTID ANGIOGRAM;  Surgeon: Lorretta Harp, MD;  Location: [facility] CATH LAB;  Service: Cardiovascular;  Laterality: N/A;  . Abdominal angiogram  02/18/2012    Procedure: ABDOMINAL ANGIOGRAM;  Surgeon: Lorretta Harp, MD;  Location: [facility] CATH LAB;  Service: Cardiovascular;;  . Renal angiogram Right 07/04/2014    Procedure: RENAL ANGIOGRAM;  Surgeon: Lorretta Harp, MD;  Location: [facility] CATH LAB;  Service: Cardiovascular;  Laterality: Right;  . Renal artery stent Right 08/22/2014  . Abdominal hysterectomy  ~ 1977   . Renal angiogram Right 08/22/2014    Procedure: RENAL ANGIOGRAM;  Surgeon: Lorretta Harp, MD;  Location: [facility] CATH LAB;  Service: Cardiovascular;  Laterality: Right;    Current Outpatient Prescriptions  Medication Sig Dispense Refill  . amLODipine (NORVASC) 10 MG tablet Take 10 mg by mouth every morning.    Marland Kitchen aspirin 81 MG tablet Take 1 tablet (81 mg total) by mouth daily. (Patient taking differently: Take 162 mg by mouth daily. )    . clopidogrel (PLAVIX) 75 MG tablet TAKE 1 TABLET DAILY 90 tablet 3  . doxazosin (CARDURA) 8 MG tablet Take 8 mg by mouth every morning.      . ibandronate (BONIVA) 150 MG tablet Take 150 mg by mouth every 30 (thirty) days. First of each month    . levothyroxine (SYNTHROID, LEVOTHROID) 88 MCG tablet Take 88 mcg by mouth daily before breakfast.    . losartan (COZAAR) 100 MG tablet Take 100 mg by mouth every morning.     . Multiple Vitamins-Minerals (MULTIVITAMIN WITH MINERALS) tablet Take 1 tablet by mouth every morning.     . Naphazoline HCl (CLEAR EYES OP) Apply 2 drops to eye 2 (two) times daily.    . rosuvastatin (CRESTOR) 10 MG tablet Take 10 mg by mouth every morning.      No current facility-administered medications for this visit.    Allergies:   Review of patient's allergies indicates no known allergies.    Social History:  The patient  reports that she has been smoking Cigarettes.  She has a 50 pack-year smoking history. She has never used smokeless tobacco. She reports that she drinks alcohol. She reports that she does not use illicit drugs.   Family History:  The patient's family history includes Cancer in her father; Heart disease in her maternal grandmother and mother.    ROS:  Please see the history of present illness. All other systems are reviewed and negative.    PHYSICAL EXAM: VS:  BP 138/70 mmHg  Pulse 87  Ht 5' (1.524 m)  Wt 97 lb 11.2 oz (44.316 kg)  BMI 19.08 kg/m2 , BMI Body mass index is 19.08 kg/(m^2). GEN: Well nourished,  well developed, frail-appearing elderly female in no acute distress HEENT: normal for age Neck: no JVD, carotid bruit noted bilaterally, left much greater than right, no masses Cardiac: RRR; systolic murmur noted, rubs, or gallops,no edema  Respiratory: Rales bases, good air exchange, slightly increased work of breathing GI: soft, nontender, nondistended, + BS MS: no deformity or atrophy; distal pulses are 2+ in all 4 extremities Skin: warm and dry, no rash Neuro:  Strength and sensation are intact Psych: euthymic mood, full affect   EKG:  EKG is ordered today. The ekg ordered today demonstrates  sinus rhythm with right atrial enlargement and Q waves in V1 and V2, unchanged   Recent Labs: 06/27/2014: TSH 5.302* 08/23/2014: BUN 13; Creatinine, Ser 0.94; Hemoglobin 12.4; Platelets 249; Potassium 3.4*; Sodium 139    Lipid Panel No results found for: CHOL, TRIG, HDL, CHOLHDL, VLDL, LDLCALC, LDLDIRECT   Wt Readings from Last 3 Encounters:  04/19/15 97 lb 11.2 oz (44.316 kg)  12/28/14 94 lb (42.638 kg)  09/23/14 96 lb (43.545 kg)     Other studies Reviewed: Additional studies/ records that were reviewed today include: Previous office notes, ECGs and lab reports, hospital records.  ASSESSMENT AND PLAN:  1.  Left greater than right neck pain: [name]'s symptoms are not consistent with angina. She is concerned that they are related to worsening of her carotid disease. She has bruit on the left that is greater than the one on the right. It has been almost a year since she had carotid Dopplers done. We will repeat these in follow-up on the results. If these are okay, she can follow-up with Dr. Alvester Chou in 6 months. If she needs to be sooner, we will make sure that happens.  Current medicines are reviewed at length with the patient today.  The patient does not have concerns regarding medicines.  The following changes have been made:  no change  Labs/ tests ordered today include:  Orders  Placed This Encounter  Procedures  . EKG 12-Lead     Disposition:   FU with Dr. Gwenlyn Found in 6 months or sooner when necessary.   Augusto Garbe  04/19/2015 5:34 PM    [facility] [place], [place], [place]  [address] Phone: [phone]; Fax: [phone]

## 2015-04-25 DIAGNOSIS — N183 Chronic kidney disease, stage 3 (moderate): Secondary | ICD-10-CM | POA: Diagnosis not present

## 2015-04-25 DIAGNOSIS — I10 Essential (primary) hypertension: Secondary | ICD-10-CM | POA: Diagnosis not present

## 2015-04-25 DIAGNOSIS — I739 Peripheral vascular disease, unspecified: Secondary | ICD-10-CM | POA: Diagnosis not present

## 2015-04-25 DIAGNOSIS — E782 Mixed hyperlipidemia: Secondary | ICD-10-CM | POA: Diagnosis not present

## 2015-04-27 ENCOUNTER — Ambulatory Visit (HOSPITAL_COMMUNITY)
Admission: RE | Admit: 2015-04-27 | Discharge: 2015-04-27 | Disposition: A | Discharge status: Home / Self Care | Payer: Medicare Other | Source: Ambulatory Visit | Attending: Internal Medicine | Admitting: Internal Medicine

## 2015-04-27 DIAGNOSIS — R0989 Other specified symptoms and signs involving the circulatory and respiratory systems: Secondary | ICD-10-CM | POA: Diagnosis not present

## 2015-04-27 DIAGNOSIS — I10 Essential (primary) hypertension: Secondary | ICD-10-CM | POA: Diagnosis not present

## 2015-04-27 DIAGNOSIS — F17200 Nicotine dependence, unspecified, uncomplicated: Secondary | ICD-10-CM | POA: Insufficient documentation

## 2015-04-27 DIAGNOSIS — I6523 Occlusion and stenosis of bilateral carotid arteries: Secondary | ICD-10-CM | POA: Diagnosis not present

## 2015-04-27 DIAGNOSIS — E785 Hyperlipidemia, unspecified: Secondary | ICD-10-CM | POA: Diagnosis not present

## 2015-04-27 DIAGNOSIS — I739 Peripheral vascular disease, unspecified: Secondary | ICD-10-CM | POA: Diagnosis not present

## 2015-05-09 DIAGNOSIS — L03113 Cellulitis of right upper limb: Secondary | ICD-10-CM | POA: Diagnosis not present

## 2015-05-17 DIAGNOSIS — L03113 Cellulitis of right upper limb: Secondary | ICD-10-CM | POA: Diagnosis not present

## 2015-05-24 DIAGNOSIS — Z85828 Personal history of other malignant neoplasm of skin: Secondary | ICD-10-CM | POA: Diagnosis not present

## 2015-05-31 ENCOUNTER — Other Ambulatory Visit: Payer: Self-pay | Admitting: Cardiovascular Disease

## 2015-05-31 DIAGNOSIS — I739 Peripheral vascular disease, unspecified: Secondary | ICD-10-CM

## 2015-05-31 DIAGNOSIS — I701 Atherosclerosis of renal artery: Secondary | ICD-10-CM

## 2015-06-07 ENCOUNTER — Ambulatory Visit (HOSPITAL_COMMUNITY)
Admission: RE | Admit: 2015-06-07 | Payer: Medicare Other | Source: Ambulatory Visit | Attending: Cardiovascular Disease | Admitting: Cardiovascular Disease

## 2015-06-07 ENCOUNTER — Ambulatory Visit (HOSPITAL_COMMUNITY)
Admission: RE | Admit: 2015-06-07 | Discharge: 2015-06-07 | Disposition: A | Discharge status: Home / Self Care | Payer: Medicare Other | Source: Ambulatory Visit | Attending: Cardiovascular Disease | Admitting: Cardiovascular Disease

## 2015-06-07 DIAGNOSIS — E785 Hyperlipidemia, unspecified: Secondary | ICD-10-CM | POA: Diagnosis not present

## 2015-06-07 DIAGNOSIS — I700 Atherosclerosis of aorta: Secondary | ICD-10-CM | POA: Insufficient documentation

## 2015-06-07 DIAGNOSIS — F17200 Nicotine dependence, unspecified, uncomplicated: Secondary | ICD-10-CM | POA: Diagnosis not present

## 2015-06-07 DIAGNOSIS — I10 Essential (primary) hypertension: Secondary | ICD-10-CM | POA: Insufficient documentation

## 2015-06-07 DIAGNOSIS — I701 Atherosclerosis of renal artery: Secondary | ICD-10-CM | POA: Diagnosis not present

## 2015-06-22 DIAGNOSIS — R3915 Urgency of urination: Secondary | ICD-10-CM | POA: Diagnosis not present

## 2015-06-22 DIAGNOSIS — N390 Urinary tract infection, site not specified: Secondary | ICD-10-CM | POA: Diagnosis not present

## 2015-07-19 DIAGNOSIS — H43813 Vitreous degeneration, bilateral: Secondary | ICD-10-CM | POA: Diagnosis not present

## 2015-07-19 DIAGNOSIS — H25813 Combined forms of age-related cataract, bilateral: Secondary | ICD-10-CM | POA: Diagnosis not present

## 2015-07-19 DIAGNOSIS — H52203 Unspecified astigmatism, bilateral: Secondary | ICD-10-CM | POA: Diagnosis not present

## 2015-07-20 DIAGNOSIS — H43813 Vitreous degeneration, bilateral: Secondary | ICD-10-CM | POA: Diagnosis not present

## 2015-07-20 DIAGNOSIS — H3552 Pigmentary retinal dystrophy: Secondary | ICD-10-CM | POA: Diagnosis not present

## 2015-07-27 ENCOUNTER — Other Ambulatory Visit: Payer: Self-pay | Admitting: Cardiovascular Disease

## 2015-07-27 MED ORDER — CLOPIDOGREL BISULFATE 75 MG PO TABS: 75.0000 mg | ORAL_TABLET | Freq: Every day | ORAL | Status: DC

## 2015-07-28 ENCOUNTER — Other Ambulatory Visit: Payer: Self-pay | Admitting: *Deleted

## 2015-07-28 MED ORDER — CLOPIDOGREL BISULFATE 75 MG PO TABS: 75.0000 mg | ORAL_TABLET | Freq: Every day | ORAL | Status: DC

## 2015-08-17 DIAGNOSIS — Z681 Body mass index (BMI) 19 or less, adult: Secondary | ICD-10-CM | POA: Diagnosis not present

## 2015-08-17 DIAGNOSIS — Z01419 Encounter for gynecological examination (general) (routine) without abnormal findings: Secondary | ICD-10-CM | POA: Diagnosis not present

## 2015-08-17 DIAGNOSIS — Z1231 Encounter for screening mammogram for malignant neoplasm of breast: Secondary | ICD-10-CM | POA: Diagnosis not present

## 2015-08-22 ENCOUNTER — Other Ambulatory Visit: Payer: Self-pay | Admitting: Obstetrics and Gynecology

## 2015-08-22 DIAGNOSIS — R928 Other abnormal and inconclusive findings on diagnostic imaging of breast: Secondary | ICD-10-CM

## 2015-08-30 DIAGNOSIS — M816 Localized osteoporosis [Lequesne]: Secondary | ICD-10-CM | POA: Diagnosis not present

## 2015-08-30 DIAGNOSIS — N958 Other specified menopausal and perimenopausal disorders: Secondary | ICD-10-CM | POA: Diagnosis not present

## 2015-08-31 ENCOUNTER — Ambulatory Visit
Admission: RE | Admit: 2015-08-31 | Discharge: 2015-08-31 | Disposition: A | Discharge status: Home / Self Care | Payer: Medicare Other | Source: Ambulatory Visit | Attending: Obstetrics and Gynecology | Admitting: Obstetrics and Gynecology

## 2015-08-31 ENCOUNTER — Other Ambulatory Visit: Payer: Self-pay | Admitting: Obstetrics and Gynecology

## 2015-08-31 DIAGNOSIS — N63 Unspecified lump in breast: Secondary | ICD-10-CM | POA: Diagnosis not present

## 2015-08-31 DIAGNOSIS — R928 Other abnormal and inconclusive findings on diagnostic imaging of breast: Secondary | ICD-10-CM

## 2015-08-31 DIAGNOSIS — N6320 Unspecified lump in the left breast, unspecified quadrant: Secondary | ICD-10-CM

## 2015-09-07 ENCOUNTER — Ambulatory Visit
Admission: RE | Admit: 2015-09-07 | Discharge: 2015-09-07 | Disposition: A | Discharge status: Home / Self Care | Payer: Medicare Other | Source: Ambulatory Visit | Attending: Obstetrics and Gynecology | Admitting: Obstetrics and Gynecology

## 2015-09-07 ENCOUNTER — Other Ambulatory Visit: Payer: Self-pay | Admitting: Obstetrics and Gynecology

## 2015-09-07 DIAGNOSIS — N63 Unspecified lump in breast: Secondary | ICD-10-CM | POA: Diagnosis not present

## 2015-09-07 DIAGNOSIS — N6320 Unspecified lump in the left breast, unspecified quadrant: Secondary | ICD-10-CM

## 2015-09-07 DIAGNOSIS — C50412 Malignant neoplasm of upper-outer quadrant of left female breast: Secondary | ICD-10-CM | POA: Diagnosis not present

## 2015-09-08 ENCOUNTER — Telehealth: Payer: Self-pay | Admitting: *Deleted

## 2015-09-08 ENCOUNTER — Encounter: Payer: Self-pay | Admitting: *Deleted

## 2015-09-08 DIAGNOSIS — C50412 Malignant neoplasm of upper-outer quadrant of left female breast: Secondary | ICD-10-CM

## 2015-09-08 HISTORY — DX: Malignant neoplasm of upper-outer quadrant of left female breast: C50.412

## 2015-09-08 NOTE — Telephone Encounter (Signed)
Confirmed [facility] for 09/13/15 at 1215 .  Instructions and contact information given.

## 2015-09-08 NOTE — Telephone Encounter (Signed)
Mailed clinic packet to pt.

## 2015-09-13 ENCOUNTER — Encounter: Payer: Self-pay | Admitting: Physical Therapy

## 2015-09-13 ENCOUNTER — Ambulatory Visit (HOSPITAL_BASED_OUTPATIENT_CLINIC_OR_DEPARTMENT_OTHER): Payer: Medicare Other | Admitting: Hematology and Oncology

## 2015-09-13 ENCOUNTER — Encounter: Payer: Self-pay | Admitting: Nurse Practitioner

## 2015-09-13 ENCOUNTER — Other Ambulatory Visit (HOSPITAL_BASED_OUTPATIENT_CLINIC_OR_DEPARTMENT_OTHER): Payer: Medicare Other

## 2015-09-13 ENCOUNTER — Encounter: Payer: Self-pay | Admitting: Hematology and Oncology

## 2015-09-13 ENCOUNTER — Ambulatory Visit
Admission: RE | Admit: 2015-09-13 | Discharge: 2015-09-13 | Disposition: A | Discharge status: Home / Self Care | Payer: Medicare Other | Source: Ambulatory Visit | Attending: Radiation Oncology | Admitting: Radiation Oncology

## 2015-09-13 ENCOUNTER — Other Ambulatory Visit: Payer: Self-pay | Admitting: General Surgery

## 2015-09-13 ENCOUNTER — Ambulatory Visit: Payer: Medicare Other | Attending: General Surgery | Admitting: Physical Therapy

## 2015-09-13 VITALS — BP 151/50 | HR 102 | Temp 97.8°F | Resp 19 | Wt 93.1 lb

## 2015-09-13 DIAGNOSIS — C50412 Malignant neoplasm of upper-outer quadrant of left female breast: Secondary | ICD-10-CM

## 2015-09-13 DIAGNOSIS — Z720 Tobacco use: Secondary | ICD-10-CM | POA: Diagnosis not present

## 2015-09-13 DIAGNOSIS — R293 Abnormal posture: Secondary | ICD-10-CM | POA: Insufficient documentation

## 2015-09-13 DIAGNOSIS — M4004 Postural kyphosis, thoracic region: Secondary | ICD-10-CM | POA: Diagnosis not present

## 2015-09-13 DIAGNOSIS — Z7901 Long term (current) use of anticoagulants: Secondary | ICD-10-CM | POA: Diagnosis not present

## 2015-09-13 DIAGNOSIS — I679 Cerebrovascular disease, unspecified: Secondary | ICD-10-CM | POA: Diagnosis not present

## 2015-09-13 DIAGNOSIS — I10 Essential (primary) hypertension: Secondary | ICD-10-CM | POA: Diagnosis not present

## 2015-09-13 DIAGNOSIS — Z923 Personal history of irradiation: Secondary | ICD-10-CM | POA: Diagnosis not present

## 2015-09-13 DIAGNOSIS — Z9889 Other specified postprocedural states: Secondary | ICD-10-CM | POA: Diagnosis not present

## 2015-09-13 DIAGNOSIS — I701 Atherosclerosis of renal artery: Secondary | ICD-10-CM | POA: Diagnosis not present

## 2015-09-13 LAB — COMPREHENSIVE METABOLIC PANEL
ALT: 16 U/L (ref 0–55)
AST: 19 U/L (ref 5–34)
Albumin: 3.9 g/dL (ref 3.5–5.0)
Alkaline Phosphatase: 65 U/L (ref 40–150)
Anion Gap: 12 mEq/L — ABNORMAL HIGH (ref 3–11)
BUN: 14.8 mg/dL (ref 7.0–26.0)
CO2: 22 mEq/L (ref 22–29)
Calcium: 9.8 mg/dL (ref 8.4–10.4)
Chloride: 104 mEq/L (ref 98–109)
Creatinine: 1 mg/dL (ref 0.6–1.1)
EGFR: 56 mL/min/{1.73_m2} — ABNORMAL LOW (ref >90)
Glucose: 157 mg/dl — ABNORMAL HIGH (ref 70–140)
Potassium: 4.1 mEq/L (ref 3.5–5.1)
Sodium: 139 mEq/L (ref 136–145)
Total Bilirubin: 0.37 mg/dL (ref 0.20–1.20)
Total Protein: 6.9 g/dL (ref 6.4–8.3)

## 2015-09-13 LAB — CBC WITH DIFFERENTIAL/PLATELET
BASO%: 0.7 % (ref 0.0–2.0)
Basophils Absolute: 0.1 10*3/uL (ref 0.0–0.1)
EOS%: 0.7 % (ref 0.0–7.0)
Eosinophils Absolute: 0.1 10*3/uL (ref 0.0–0.5)
HCT: 43.9 % (ref 34.8–46.6)
HGB: 14.4 g/dL (ref 11.6–15.9)
LYMPH%: 16.5 % (ref 14.0–49.7)
MCH: 31.7 pg (ref 25.1–34.0)
MCHC: 32.9 g/dL (ref 31.5–36.0)
MCV: 96.3 fL (ref 79.5–101.0)
MONO#: 0.5 10*3/uL (ref 0.1–0.9)
MONO%: 7.2 % (ref 0.0–14.0)
NEUT#: 5.6 10*3/uL (ref 1.5–6.5)
NEUT%: 74.9 % (ref 38.4–76.8)
Platelets: 212 10*3/uL (ref 145–400)
RBC: 4.56 10*6/uL (ref 3.70–5.45)
RDW: 15.7 % — ABNORMAL HIGH (ref 11.2–14.5)
WBC: 7.4 10*3/uL (ref 3.9–10.3)
lymph#: 1.2 10*3/uL (ref 0.9–3.3)

## 2015-09-13 NOTE — Progress Notes (Signed)
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   HISTORY OF PRESENT ILLNESS::Kendra Watts is a 70 y.o. female who presents today because of an abnormal mammogram, revealing a 4 mm mass in the upper-outer quadrant of the left breast in the 1 o'clock position. She had a an ultrasound guided biopsy 09/07/2015, revealing low grade invasive ductal carcinoma, ER (100%), PR (90%), Her2-neu negative, and Ki 67 10%.  PREVIOUS RADIATION THERAPY: No  PAST MEDICAL HISTORY:  has a past medical history of Hypertension; Hyperlipidemia; Anemia; Heart murmur; Peripheral vascular disease ([place]); Tobacco abuse; Family history of heart disease; Aortic arch anomaly; History of nuclear stress test (01/2012); Hyperthyroidism; Hypothyroidism; and Breast cancer of upper-outer quadrant of left female breast ([place]) (09/08/2015).    PAST SURGICAL HISTORY: Past Surgical History  Procedure Laterality Date  . Arch aortogram    . Subclavian angiogram Left   . Renal angiogram Left 06/08/2010    renal artery stent -  5x12 Genesis on Aviator balloon stent (Dr. Adora Fridge)  . Appendectomy    . Tonsillectomy    . Endarterectomy  04/02/2012    Procedure: ENDARTERECTOMY CAROTID;  Surgeon: Serafina Mitchell, MD;  Location: [facility] OR;  Service: Vascular;  Laterality: Right;  . Cartoid doppler  07/2012    R subclavian - 0-49% diameter reduction; R vertebral with abnormal retrograde flow; R bulb w/50-68% diameter reduction; R ICA endarterectomy 0-49% diameter reduction; L CCA w/mod amount of fibrous plaque; L bulb/prox ICA 0-49% diameter reduction   . Renal artery doppler  12/2011    abd aorta with mod amount of atherosclerosis;  SMA w/prox vessel narrowing >50% diameter reduction; R renal 1-59% diameter reduction; L renal artery stent with normal patency; R & L kidneys normal   . Lower extremity arterial doppler  12/2011    ABIS 0.91; occlusive disease of R & L post tibial arteries   . Radioactive iodine treatment      thyroid  . Carotid angiogram N/A 02/18/2012    Procedure: CAROTID ANGIOGRAM;  Surgeon: Lorretta Harp, MD;  Location: [facility] CATH LAB;  Service: Cardiovascular;  Laterality: N/A;  . Abdominal angiogram  02/18/2012    Procedure: ABDOMINAL ANGIOGRAM;  Surgeon: Lorretta Harp, MD;  Location: [facility] CATH LAB;  Service: Cardiovascular;;  . Renal angiogram Right 07/04/2014    Procedure: RENAL ANGIOGRAM;  Surgeon: Lorretta Harp, MD;  Location: [facility] CATH LAB;  Service: Cardiovascular;  Laterality: Right;  . Renal artery stent Right 08/22/2014  . Abdominal hysterectomy  ~ 1977  . Renal angiogram Right 08/22/2014    Procedure: RENAL ANGIOGRAM;  Surgeon: Lorretta Harp, MD;  Location: [facility] CATH LAB;  Service: Cardiovascular;  Laterality: Right;    FAMILY HISTORY: family history includes Cancer in her father; Heart disease in her maternal grandmother and mother.  SOCIAL HISTORY:  reports that she has been smoking Cigarettes.  She has a 50 pack-year smoking history. She has never used smokeless tobacco. She reports that she drinks alcohol. She reports that she does not use illicit drugs. retired from working with [place] [employer] 40 years  ALLERGIES: Review of patient's allergies indicates no known allergies.  MEDICATIONS:  Current Outpatient Prescriptions  Medication Sig Dispense Refill  .  amLODipine (NORVASC) 10 MG tablet Take 10 mg by mouth every morning.    Marland Kitchen aspirin 81 MG tablet Take 1 tablet (81 mg total) by mouth daily. (Patient taking differently: Take 162 mg by mouth daily. )    . clopidogrel (PLAVIX) 75 MG tablet Take 1 tablet (75 mg total) by mouth daily. 90 tablet 1  . doxazosin (CARDURA) 8 MG tablet Take 8  mg by mouth every morning.      . ibandronate (BONIVA) 150 MG tablet Take 150 mg by mouth every 30 (thirty) days. First of each month    . levothyroxine (SYNTHROID, LEVOTHROID) 88 MCG tablet Take 88 mcg by mouth daily before breakfast.    . losartan (COZAAR) 100 MG tablet Take 100 mg by mouth every morning.     . Multiple Vitamins-Minerals (MULTIVITAMIN WITH MINERALS) tablet Take 1 tablet by mouth every morning.     . Naphazoline HCl (CLEAR EYES OP) Apply 2 drops to eye 2 (two) times daily.    . rosuvastatin (CRESTOR) 10 MG tablet Take 10 mg by mouth every morning.      No current facility-administered medications for this encounter.    REVIEW OF SYSTEMS:  A 15 point review of systems is documented in the electronic medical record. This was obtained by the nursing staff. However, I reviewed this with the patient to discuss relevant findings and make appropriate changes.  Pertinent items are noted in HPI.   PHYSICAL EXAM:  Vitals - 1 value per visit 4/54/0981  SYSTOLIC 191  DIASTOLIC 50  Pulse 478  Temperature 97.8  Respirations 19  Weight (lb) 93.1  Height   BMI 18.18  VISIT REPORT    General: Alert and oriented, in no acute distress HEENT: Head is normocephalic. Extraocular movements are intact. Oropharynx is clear. Neck: Neck is supple, no palpable cervical or supraclavicular lymphadenopathy. Heart: Regular in rate and rhythm with no murmurs, rubs, or gallops. Chest: Clear to auscultation bilaterally, with no rhonchi, wheezes, or rales. Extremities: No cyanosis or edema. Lymphatics: see Neck Exam Psychiatric: Judgment and insight are intact. Affect is appropriate. Breast: Right breast shows no palpable mass or nipple discharge. Left breast shows small biopsy site in the upper-outer quadrant. No dominant mass appreciated in the left breast. No nipple discharge or bleeding.  Gynecologic History  Age at first menstrual period? 11 or 12  Are you still having periods? No  If you no  longer have periods: Have you used hormone replacement? No Obstetric History:  How many children have you carried to term? N/A    Pregnant now or trying to get pregnant? No  Have you used birth control pills or hormone shots for contraception? No Health Maintenance:  Have you ever had a colonoscopy? Yes If yes, date? 2011  Have you ever had a bone density? Yes If yes, date? Feb. 2017  Date of your last PAP smear? 2015   ECOG = 0  LABORATORY DATA:  Lab Results  Component Value Date   WBC 7.4 09/13/2015   HGB 14.4 09/13/2015   HCT 43.9 09/13/2015   MCV 96.3 09/13/2015   PLT 212 09/13/2015   NEUTROABS 5.6 09/13/2015   Lab Results  Component Value Date   NA 139 08/23/2014   K 3.4* 08/23/2014   CL 107 08/23/2014   CO2 23 08/23/2014   GLUCOSE 95 08/23/2014   CREATININE 0.94 08/23/2014   CALCIUM 8.7 08/23/2014      RADIOGRAPHY: Mm Digital Diagnostic Unilat L  09/07/2015  CLINICAL DATA:  Status post ultrasound-guided core needle biopsy of left 1 o'clock breast nodule. EXAM: DIAGNOSTIC LEFT MAMMOGRAM POST ULTRASOUND BIOPSY COMPARISON:  Previous exam(s). FINDINGS: Mammographic images were obtained following ultrasound guided biopsy of left breast 1 o'clock nodule. Two-view mammography demonstrates ribbon shaped post biopsy tissue marker which overlaps the nodule on the MLO view and is located 6 mm medially on the craniocaudal view. IMPRESSION: Successful placement of left breast 1 o'clock post biopsy tissue marker, which is located 6 mm medially to the lesion. Final Assessment: Post Procedure Mammograms for Marker Placement Electronically Signed   By: Fidela Salisbury M.D.   On: 09/07/2015 16:03   US Breast Ltd Uni Left Inc Axilla  08/31/2015  CLINICAL DATA:  The patient was called back from screening mammography due to a mass and distortion in the upper outer left breast. EXAM: DIGITAL DIAGNOSTIC LEFT MAMMOGRAM WITH 3D TOMOSYNTHESIS ULTRASOUND LEFT BREAST COMPARISON:  Previous  exam(s). ACR Breast Density Category b: There are scattered areas of fibroglandular density. FINDINGS: The small mass with associated distortion in the upper outer left breast at a posterior depth persists on additional imaging, measuring up to 5 mm. No other suspicious findings. On physical exam, no suspicious findings are identified. Targeted ultrasound is performed, showing a hypoechoic irregular mass with posterior shadowing at 1 o'clock, 6 cm from the nipple measuring 3.7 x 3.4 x 3.6 mm. No axillary adenopathy. IMPRESSION: Suspicious mass in the upper outer left breast. RECOMMENDATION: Ultrasound-guided biopsy of the suspicious left breast. I have discussed the findings and recommendations with the patient. Results were also provided in writing at the conclusion of the visit. If applicable, a reminder letter will be sent to the patient regarding the next appointment. BI-RADS CATEGORY  4: Suspicious. Electronically Signed   By: Dorise Bullion III M.D   On: 08/31/2015 14:50   Mm Diag Breast Tomo Uni Left  08/31/2015  CLINICAL DATA:  The patient was called back from screening mammography due to a mass and distortion in the upper outer left breast. EXAM: DIGITAL DIAGNOSTIC LEFT MAMMOGRAM WITH 3D TOMOSYNTHESIS ULTRASOUND LEFT BREAST COMPARISON:  Previous exam(s). ACR Breast Density Category b: There are scattered areas of fibroglandular density. FINDINGS: The small mass with associated distortion in the upper outer left breast at a posterior depth persists on additional imaging, measuring up to 5 mm. No other suspicious findings. On physical exam, no suspicious findings are identified. Targeted ultrasound is performed, showing a hypoechoic irregular mass with posterior shadowing at 1 o'clock, 6 cm from the nipple measuring 3.7 x 3.4 x 3.6 mm. No axillary adenopathy. IMPRESSION: Suspicious mass in the upper outer left breast. RECOMMENDATION: Ultrasound-guided biopsy of the suspicious left breast. I have discussed  the findings and recommendations with the patient. Results were also provided in writing at the conclusion of the visit. If applicable, a reminder letter will be sent to the patient regarding the next appointment. BI-RADS CATEGORY  4: Suspicious. Electronically Signed   By: Dorise Bullion III M.D   On: 08/31/2015 14:50   [place] Lt Breast Bx W Loc Dev 1st Lesion Img Bx Spec US Guide  09/07/2015  CLINICAL DATA:  Left breast 1 o'clock irregular nodule. EXAM: ULTRASOUND GUIDED LEFT BREAST CORE NEEDLE BIOPSY COMPARISON:  Previous exam(s). FINDINGS: I met with the patient and we discussed the procedure of ultrasound-guided biopsy, including benefits and alternatives. We discussed the high likelihood of a successful procedure. We discussed the risks of the procedure, including infection, bleeding, tissue injury, clip  migration, and inadequate sampling. Informed written consent was given. The usual time-out protocol was performed immediately prior to the procedure. Using sterile technique and 1% Lidocaine as local anesthetic, under direct ultrasound visualization, a 14 gauge spring-loaded device was used to perform biopsy of left breast 1 o'clock nodule using a lateral approach. At the conclusion of the procedure a ribbon shaped tissue marker clip was deployed into the biopsy cavity. Follow up 2 view mammogram was performed and dictated separately. IMPRESSION: Ultrasound guided biopsy of left breast 1 o'clock nodule. No apparent complications. Electronically Signed   By: Fidela Salisbury M.D.   On: 09/07/2015 15:44    IMPRESSION: Ms. Fuhr is a 69 yo female with low grade invasive ductal carcinoma. She is a good candidate for a partial mastectomy and sentinel node biopsy, hormone therapy, and radiation.  PLAN: She will get a partial mastectomy and sentinel node biopsy. She will follow up with me two weeks after her surgery to talk about treatment and schedule a planning appointment.       ------------------------------------------------  Blair Promise, PhD, MD    This document serves as a record of services personally performed by [name], MD. It was created on his behalf by Lendon Collar, a trained medical scribe. The creation of this record is based on the scribe's personal observations and the provider's statements to them. This document has been checked and approved by the attending provider.

## 2015-09-13 NOTE — Patient Instructions (Signed)

## 2015-09-13 NOTE — Progress Notes (Signed)
[place] NOTE  Patient Care Team: Merrilee Seashore, MD as PCP - General (Internal Medicine) Lorretta Harp, MD as Attending Physician (Cardiology) Milus Banister, MD (Gastroenterology) Molli Posey, MD as Consulting Physician (Obstetrics and Gynecology) Sylvan Cheese, NP as Nurse Practitioner (Hematology and Oncology)  CHIEF COMPLAINTS/PURPOSE OF CONSULTATION:  Newly diagnosed breast cancer  HISTORY OF PRESENTING ILLNESS:  Kendra Watts 70 y.o. female is here because of recent diagnosis of left breast cancer. She had a screening mammogram that detected a left breast mass at 1:00 position that measured 4 mm in size. Biopsy was performed that came back as grade 1 invasive ductal carcinoma that was ER/PR positive and HER-2 negative with a Ki-67 of 10%. She was presented this morning at the multidisciplinary tumor board and she is here today to discuss the treatment plan and M.D. C clinic. Patient reports no major problems or concerns with the biopsy. She is here today by herself. She has no immediate family members. She has a distant nephew who may be her nearest contact. She does not have any children she has only a half sister who she does not keep up with.  I reviewed her records extensively and collaborated the history with the patient.  SUMMARY OF ONCOLOGIC HISTORY:   Breast cancer of upper-outer quadrant of left female breast ([place])   09/07/2015 Initial Diagnosis Screening detected left breast mass at 1:00 position 4 x 3 x 4 mm biopsy grade 1 invasive ductal carcinoma ER 100% PR 90% positive HER-2 negative ratio 1.18 Ki-67 10%, T1 aN0 stage IA clinical stage    In terms of breast cancer risk profile:  She menarched at early age of 53 and went to menopause at age 6  She had no children She has not received birth control pills.  She was never exposed to fertility medications or hormone replacement therapy.  She has no family history of  Breast/GYN/GI cancer  MEDICAL HISTORY:  Past Medical History  Diagnosis Date  . Hypertension   . Hyperlipidemia   . Anemia   . Heart murmur     hx  . Peripheral vascular disease ([place])     a. H/o L renal artery stent. b. R carotid endarterectomy (03/2012). c. known moderate bilateral iliac disease. d. s/p PTA/stenting of ostial right renal artery stenosis 08/2014.   . Tobacco abuse   . Family history of heart disease   . Aortic arch anomaly     arteria [place]   . History of nuclear stress test 01/2012    lexiscan; normal pattern of perfusion; non-diagnostic for ischemia; low risk   . Hyperthyroidism   . Hypothyroidism     S/P radioactive iodine tx  . Breast cancer of upper-outer quadrant of left female breast ([place]) 09/08/2015    SURGICAL HISTORY: Past Surgical History  Procedure Laterality Date  . Arch aortogram    . Subclavian angiogram Left   . Renal angiogram Left 06/08/2010    renal artery stent -  5x12 Genesis on Aviator balloon stent (Dr. Adora Fridge)  . Appendectomy    . Tonsillectomy    . Endarterectomy  04/02/2012    Procedure: ENDARTERECTOMY CAROTID;  Surgeon: Serafina Mitchell, MD;  Location: [facility] OR;  Service: Vascular;  Laterality: Right;  . Cartoid doppler  07/2012    R subclavian - 0-49% diameter reduction; R vertebral with abnormal retrograde flow; R bulb w/50-68% diameter reduction; R ICA endarterectomy 0-49% diameter reduction; L CCA w/mod amount of fibrous  plaque; L bulb/prox ICA 0-49% diameter reduction   . Renal artery doppler  12/2011    abd aorta with mod amount of atherosclerosis; SMA w/prox vessel narrowing >50% diameter reduction; R renal 1-59% diameter reduction; L renal artery stent with normal patency; R & L kidneys normal   . Lower extremity arterial doppler  12/2011    ABIS 0.91; occlusive disease of R & L post tibial arteries   . Radioactive iodine treatment      thyroid  . Carotid angiogram N/A 02/18/2012    Procedure: CAROTID ANGIOGRAM;  Surgeon: Runell Gess, MD;  Location: [facility] CATH LAB;  Service: Cardiovascular;  Laterality: N/A;  . Abdominal angiogram  02/18/2012    Procedure: ABDOMINAL ANGIOGRAM;  Surgeon: Runell Gess, MD;  Location: [facility] CATH LAB;  Service: Cardiovascular;;  . Renal angiogram Right 07/04/2014    Procedure: RENAL ANGIOGRAM;  Surgeon: Runell Gess, MD;  Location: [facility] CATH LAB;  Service: Cardiovascular;  Laterality: Right;  . Renal artery stent Right 08/22/2014  . Abdominal hysterectomy  ~ 1977  . Renal angiogram Right 08/22/2014    Procedure: RENAL ANGIOGRAM;  Surgeon: Runell Gess, MD;  Location: [facility] CATH LAB;  Service: Cardiovascular;  Laterality: Right;    SOCIAL HISTORY: Social History   Social History  . Marital Status: Divorced    Spouse Name: N/A  . Number of Children: 0  . Years of Education: N/A   Occupational History  . HR administrator    Social History Main Topics  . Smoking status: Current Every Day Smoker -- 1.50 packs/day for 40 years    Types: Cigarettes  . Smokeless tobacco: Never Used     Comment: has a prescription for Chantix and states she will get it filled and has a desire to stop smoking occ alcohol  . Alcohol Use: Yes     Comment: 08/22/2014 "might drinik a 6 pack of beer/month"  . Drug Use: No  . Sexual Activity: No   Other Topics Concern  . Not on file   Social History Narrative    FAMILY HISTORY: Family History  Problem Relation Age of Onset  . Heart disease Maternal Grandmother   . Heart disease Mother     MI @ 104, died at 31  . Cancer Father   . Lung cancer Father   . Lung cancer Sister   . Breast cancer Paternal Aunt     ALLERGIES:  has No Known Allergies.  MEDICATIONS:  Current Outpatient Prescriptions  Medication Sig Dispense Refill  . amLODipine (NORVASC) 10 MG tablet Take 10 mg by mouth every morning.    Marland Kitchen aspirin 81 MG tablet Take 1 tablet (81 mg total) by mouth daily. (Patient taking differently: Take 162 mg by mouth daily. )    . clopidogrel  (PLAVIX) 75 MG tablet Take 1 tablet (75 mg total) by mouth daily. 90 tablet 1  . doxazosin (CARDURA) 8 MG tablet Take 8 mg by mouth every morning.      Marland Kitchen levothyroxine (SYNTHROID, LEVOTHROID) 88 MCG tablet Take 88 mcg by mouth daily before breakfast.    . losartan (COZAAR) 100 MG tablet Take 100 mg by mouth every morning.     . Multiple Vitamins-Minerals (MULTIVITAMIN WITH MINERALS) tablet Take 1 tablet by mouth every morning.     . rosuvastatin (CRESTOR) 10 MG tablet Take 10 mg by mouth every morning.      No current facility-administered medications for this visit.    REVIEW OF SYSTEMS:  Constitutional: Denies fevers, chills or abnormal night sweats Eyes: Denies blurriness of vision, double vision or watery eyes Ears, nose, mouth, throat, and face: Denies mucositis or sore throat Respiratory: Denies cough, dyspnea or wheezes Cardiovascular: Denies palpitation, chest discomfort or lower extremity swelling Gastrointestinal:  Denies nausea, heartburn or change in bowel habits Skin: Denies abnormal skin rashes Lymphatics: Denies new lymphadenopathy or easy bruising Neurological:Denies numbness, tingling or new weaknesses Behavioral/Psych: Mood is stable, no new changes  Breast:  Denies any palpable lumps or discharge All other systems were reviewed with the patient and are negative.  PHYSICAL EXAMINATION: ECOG PERFORMANCE STATUS: 0 - Asymptomatic  Filed Vitals:   09/13/15 1237  BP: 151/50  Pulse: 102  Temp: 97.8 F (36.6 C)  Resp: 19   Filed Weights   09/13/15 1237  Weight: 93 lb 1.6 oz (42.23 kg)    GENERAL:alert, no distress and comfortable SKIN: skin color, texture, turgor are normal, no rashes or significant lesions EYES: normal, conjunctiva are pink and non-injected, sclera clear OROPHARYNX:no exudate, no erythema and lips, buccal mucosa, and tongue normal  NECK: supple, thyroid normal size, non-tender, without nodularity LYMPH:  no palpable lymphadenopathy in the  cervical, axillary or inguinal LUNGS: clear to auscultation and percussion with normal breathing effort HEART: regular rate & rhythm and no murmurs and no lower extremity edema ABDOMEN:abdomen soft, non-tender and normal bowel sounds Musculoskeletal:no cyanosis of digits and no clubbing  PSYCH: alert & oriented x 3 with fluent speech NEURO: no focal motor/sensory deficits BREAST: No palpable nodules in breast. No palpable axillary or supraclavicular lymphadenopathy (exam performed in the presence of a chaperone)   LABORATORY DATA:  I have reviewed the data as listed Lab Results  Component Value Date   WBC 7.4 09/13/2015   HGB 14.4 09/13/2015   HCT 43.9 09/13/2015   MCV 96.3 09/13/2015   PLT 212 09/13/2015   Lab Results  Component Value Date   NA 139 09/13/2015   K 4.1 09/13/2015   CL 107 08/23/2014   CO2 22 09/13/2015   ASSESSMENT AND PLAN:  Breast cancer of upper-outer quadrant of left female breast ([place]) 09/07/2015:Screening detected left breast mass at 1:00 position 4 x 3 x 4 mm biopsy grade 1 invasive ductal carcinoma ER 100% PR 90% positive HER-2 negative ratio 1.18 Ki-67 10%, T1 aN0 stage IA clinical stage  Pathology and radiology counseling: Discussed with the patient, the details of pathology including the type of breast cancer,the clinical staging, the significance of ER, PR and HER-2/neu receptors and the implications for treatment. After reviewing the pathology in detail, we proceeded to discuss the different treatment options between surgery, radiation, antiestrogen therapies.  Recommendation: 1. Breast conserving surgery with sentinel lymph node biopsy 2. Adjuvant radiation therapy 3. Followed by antiestrogen therapy  Based on the small tumors as I did not believe that she would benefit from systemic chemotherapy nor would we sent for Oncotype DX testing.  Return to clinic after surgery to discuss the final pathology report    All questions were answered. The  patient knows to call the clinic with any problems, questions or concerns.    Rulon Eisenmenger, MD 09/13/2015

## 2015-09-13 NOTE — Therapy (Signed)
[place], [place], [address] Phone: [phone]   Fax:  [phone]  Physical Therapy Evaluation  Patient Details  Name: Kendra Watts MRN: [id number] Date of Birth: March 02, 1946 Referring Provider: Dr. Fanny Skates  Encounter Date: 09/13/2015      PT End of Session - 09/13/15 1606    Visit Number 1   Number of Visits 1   PT Start Time 1406   PT Stop Time 1432   PT Time Calculation (min) 26 min   Activity Tolerance Patient tolerated treatment well   Behavior During Therapy [facility] for tasks assessed/performed      Past Medical History  Diagnosis Date  . Hypertension   . Hyperlipidemia   . Anemia   . Heart murmur     hx  . Peripheral vascular disease ([place])     a. H/o L renal artery stent. b. R carotid endarterectomy (03/2012). c. known moderate bilateral iliac disease. d. s/p PTA/stenting of ostial right renal artery stenosis 08/2014.   . Tobacco abuse   . Family history of heart disease   . Aortic arch anomaly     arteria [place]   . History of nuclear stress test 01/2012    lexiscan; normal pattern of perfusion; non-diagnostic for ischemia; low risk   . Hyperthyroidism   . Hypothyroidism     S/P radioactive iodine tx  . Breast cancer of upper-outer quadrant of left female breast ([place]) 09/08/2015    Past Surgical History  Procedure Laterality Date  . Arch aortogram    . Subclavian angiogram Left   . Renal angiogram Left 06/08/2010    renal artery stent -  5x12 Genesis on Aviator balloon stent (Dr. Adora Fridge)  . Appendectomy    . Tonsillectomy    . Endarterectomy  04/02/2012    Procedure: ENDARTERECTOMY CAROTID;  Surgeon: Serafina Mitchell, MD;  Location: [facility] OR;  Service: Vascular;  Laterality: Right;  . Cartoid doppler  07/2012    R subclavian - 0-49% diameter reduction; R vertebral with abnormal retrograde flow; R bulb w/50-68% diameter reduction; R ICA endarterectomy 0-49% diameter  reduction; L CCA w/mod amount of fibrous plaque; L bulb/prox ICA 0-49% diameter reduction   . Renal artery doppler  12/2011    abd aorta with mod amount of atherosclerosis; SMA w/prox vessel narrowing >50% diameter reduction; R renal 1-59% diameter reduction; L renal artery stent with normal patency; R & L kidneys normal   . Lower extremity arterial doppler  12/2011    ABIS 0.91; occlusive disease of R & L post tibial arteries   . Radioactive iodine treatment      thyroid  . Carotid angiogram N/A 02/18/2012    Procedure: CAROTID ANGIOGRAM;  Surgeon: Lorretta Harp, MD;  Location[facility] C[facility] LAB;  Service: Cardiovascular;  Laterality: N/A;  . Abdominal angiogram  02/18/2012    Procedure: ABDOMINAL ANGIOGRAM;  Surgeon: Lorretta Harp, MD;  Location: [facility] CATH LAB;  Service: Cardiovascular;;  . Renal angiogram Right 07/04/2014    Procedure: RENAL ANGIOGRAM;  Surgeon: Lorretta Harp, MD;  Location: [facility] CATH LAB;  Service: Cardiovascular;  Laterality: Right;  . Renal artery stent Right 08/22/2014  . Abdominal hysterectomy  ~ 1977  . Renal angiogram Right 08/22/2014    Procedure: RENAL ANGIOGRAM;  Surgeon: Lorretta Harp, MD;  Location: [facility] CATH LAB;  Service: Cardiovascular;  Laterality: Right;    There were no vitals filed for this visit.  Visit Diagnosis:  Carcinoma of upper-outer quadrant of left female breast ([place]) - Plan: PT plan of care cert/re-cert  Postural kyphosis of thoracic region - Plan: PT plan of care cert/re-cert  Abnormal posture - Plan: PT plan of care cert/re-cert      Subjective Assessment - 09/13/15 1543    Subjective Patient was seen today for a baseline assessment of her newly diagnosed left breast cancer.  Her case was discussed in the breast multidisciplinary conference among her medical team and a treatment plan was determined..   Pertinent History Patient was diagnosed on 08/17/15 with left low grade invasive ductal carcinoma located in the upper outer quadrant measuring  4 mm.  It is ER/PR positive and HER2 negative.   Patient Stated Goals Reduce lymphedema risk and learn post op shoulder ROM HEP   Currently in Pain? No/denies            [facility] PT Assessment - 09/13/15 0001    Assessment   Medical Diagnosis Left breast cancer   Referring Provider Dr. Fanny Skates   Onset Date/Surgical Date 08/17/15   Hand Dominance Left   Prior Therapy none   Precautions   Precautions Other (comment)   Precaution Comments active breast cancer   Restrictions   Weight Bearing Restrictions No   Balance Screen   Has the patient fallen in the past 6 months No   Has the patient had a decrease in activity level because of a fear of falling?  No   Is the patient reluctant to leave their home because of a fear of falling?  No   Home Environment   Living Environment Private residence   Living Arrangements Alone   Available Help at Discharge Friend(s)   Prior Function   Level of [place] Retired   Leisure She does not exercise   Cognition   Overall Cognitive Status Within Functional Limits for tasks assessed   Posture/Postural Control   Posture/Postural Control Postural limitations   Postural Limitations Rounded Shoulders;Forward head;Increased thoracic kyphosis   ROM / Strength   AROM / PROM / Strength AROM;Strength   AROM   AROM Assessment Site Shoulder   Right/Left Shoulder Right;Left   Right Shoulder Extension 56 Degrees   Right Shoulder Flexion 137 Degrees   Right Shoulder ABduction 154 Degrees   Right Shoulder Internal Rotation 70 Degrees   Right Shoulder External Rotation 76 Degrees   Left Shoulder Extension 45 Degrees   Left Shoulder Flexion 134 Degrees   Left Shoulder ABduction 142 Degrees   Left Shoulder Internal Rotation 67 Degrees   Left Shoulder External Rotation 82 Degrees   Strength   Overall Strength Within functional limits for tasks performed           LYMPHEDEMA/ONCOLOGY QUESTIONNAIRE - 09/13/15 1601     Type   Cancer Type Left breast cancer   Lymphedema Assessments   Lymphedema Assessments Upper extremities   Right Upper Extremity Lymphedema   10 cm Proximal to Olecranon Process 18.4 cm   Olecranon Process 18.5 cm   10 cm Proximal to Ulnar Styloid Process 16.4 cm   Just Proximal to Ulnar Styloid Process 12.7 cm   Across Hand at [employer] 16.8 cm   At [place] of 2nd Digit 5.7 cm   Left Upper Extremity Lymphedema   10 cm Proximal to Olecranon Process 19.5 cm   Olecranon Process 19.1 cm   10 cm Proximal to Ulnar Styloid Process 16.6 cm   Just Proximal to Ulnar Styloid  Process 12.7 cm   Across Hand at [employer] 17.3 cm   At [place] of 2nd Digit 5.6 cm      Patient was instructed today in a home exercise program today for post op shoulder range of motion. These included active assist shoulder flexion in sitting, scapular retraction, wall walking with shoulder abduction, and hands behind head external rotation.  She was encouraged to do these twice a day, holding 3 seconds and repeating 5 times when permitted by her physician.         PT Education - September 24, 2015 1603    Education provided Yes   Education Details Lymphedema risk reduction and post op shoulder ROM HEP   Person(s) Educated Patient   Methods Explanation;Demonstration;Handout   Comprehension Verbalized understanding;Returned demonstration              Breast Clinic Goals - Sep 24, 2015 1620    Patient will be able to verbalize understanding of pertinent lymphedema risk reduction practices relevant to her diagnosis specifically related to skin care.   Time 1   Period Days   Status Achieved   Patient will be able to return demonstrate and/or verbalize understanding of the post-op home exercise program related to regaining shoulder range of motion.   Time 1   Period Days   Status Achieved   Patient will be able to verbalize understanding of the importance of attending the postoperative After Breast Cancer Class for  further lymphedema risk reduction education and therapeutic exercise.   Time 1   Period Days   Status Achieved              Plan - 09/24/2015 1607    Clinical Impression Statement Patient was diagnosed on 08/17/15 with left low grade invasive ductal carcinoma located in the upper outer quadrant measuring 4 mm.  It is ER/PR positive and HER2 negative.  She is planning to have a left lumpectomy and sentinel node biopsy followed by radiation and anti-estrogen therapy.  She may benefit from post op PT to regain shoulder ROM and reduce lymphedema risk.   Pt will benefit from skilled therapeutic intervention in order to improve on the following deficits Decreased strength;Pain;Impaired UE functional use;Decreased range of motion   Rehab Potential Excellent   PT Frequency One time visit   PT Treatment/Interventions Therapeutic exercise;Patient/family education   PT Next Visit Plan F/u after surgery to determine needs   PT Home Exercise Plan Shoulder ROM HEP   Consulted and Agree with Plan of Care Patient     Patient will follow up at outpatient cancer rehab if needed following surgery.  If the patient requires physical therapy at that time, a specific plan will be dictated and sent to the referring physician for approval. The patient was educated today on appropriate basic range of motion exercises to begin post operatively and the importance of attending the After Breast Cancer class following surgery.  Patient was educated today on lymphedema risk reduction practices as it pertains to recommendations that will benefit the patient immediately following surgery.  She verbalized good understanding.  No additional physical therapy is indicated at this time.         G-Codes - 2015-09-24 1621    Functional Assessment Tool Used Clinical Judgement   Functional Limitation Other PT primary   Other PT Primary Current Status (X1062) At least 1 percent but less than 20 percent impaired, limited or  restricted   Other PT Primary Goal Status (I9485) At least 1 percent but  less than 20 percent impaired, limited or restricted   Other PT Primary Discharge Status [phone]) At least 1 percent but less than 20 percent impaired, limited or restricted       Problem List Patient Active Problem List   Diagnosis Date Noted  . Breast cancer of upper-outer quadrant of left female breast ([place]) 09/08/2015  . Renal artery stenosis ([place]) 08/23/2014  . Right upper extremity numbness 07/01/2013  . Atherosclerotic renal artery stenosis, bilateral ([place]) 05/26/2013  . Peripheral arterial disease ([place]) 05/26/2013  . Essential hypertension 05/26/2013  . Hyperlipidemia 05/26/2013  . Tobacco abuse 05/26/2013  . Occlusion and stenosis of carotid artery without mention of cerebral infarction 03/09/2012   Annia Friendly, PT 09/13/2015 4:23 PM  [place], [place], [address] Phone: [phone]   Fax:  [phone]  Name: Kendra Watts MRN: [id number] Date of Birth: 22-Jul-1946

## 2015-09-13 NOTE — Progress Notes (Signed)
[name] is a very pleasant 70 y.o. female from [place], [place] with newly diagnosed invasive ductal carcinoma of the left breast.  Biopsy results revealed the tumor's prognostic profile is ER positive, PR positive, and HER2/neu negative.   She presents today to the [facility]) for treatment consideration and recommendations from the breast surgeon, radiation oncologist, and medical oncologist.     I briefly met with Kendra Watts during her [facility] visit today. We discussed the purpose of the Survivorship Clinic, which will include monitoring for recurrence, coordinating completion of age and gender-appropriate cancer screenings, promotion of overall wellness, as well as managing potential late/long-term side effects of anti-cancer treatments.    The treatment plan for Kendra Watts will likely include surgery, radiation therapy, and anti-estrogen therapy.  As of today, the intent of treatment for Kendra Watts is cure, therefore she will be eligible for the Survivorship Clinic upon her completion of treatment.  Her survivorship care plan (SCP) document will be drafted and updated throughout the course of her treatment trajectory. She will receive the SCP in an office visit with myself in the Survivorship Clinic once she has completed treatment.   Kendra Watts was encouraged to ask questions and all questions were answered to her satisfaction.  She was given my business card and encouraged to contact me with any concerns regarding survivorship.  I look forward to participating in her care.   Kenn File, [place] [phone]

## 2015-09-13 NOTE — Assessment & Plan Note (Addendum)
09/07/2015:Screening detected left breast mass at 1:00 position 4 x 3 x 4 mm biopsy grade 1 invasive ductal carcinoma ER 100% PR 90% positive HER-2 negative ratio 1.18 Ki-67 10%, T1 aN0 stage IA clinical stage  Pathology and radiology counseling: Discussed with the patient, the details of pathology including the type of breast cancer,the clinical staging, the significance of ER, PR and HER-2/neu receptors and the implications for treatment. After reviewing the pathology in detail, we proceeded to discuss the different treatment options between surgery, radiation, antiestrogen therapies.  Recommendation: 1. Breast conserving surgery with sentinel lymph node biopsy 2. Adjuvant radiation therapy 3. Followed by antiestrogen therapy  Based on the small tumors as I did not believe that she would benefit from systemic chemotherapy nor would we sent for Oncotype DX testing.  Return to clinic after surgery to discuss the final pathology report

## 2015-09-15 ENCOUNTER — Encounter (HOSPITAL_BASED_OUTPATIENT_CLINIC_OR_DEPARTMENT_OTHER): Payer: Self-pay | Admitting: *Deleted

## 2015-09-15 ENCOUNTER — Other Ambulatory Visit: Payer: Self-pay | Admitting: *Deleted

## 2015-09-15 ENCOUNTER — Telehealth: Payer: Self-pay | Admitting: *Deleted

## 2015-09-15 ENCOUNTER — Other Ambulatory Visit: Payer: Self-pay | Admitting: General Surgery

## 2015-09-15 DIAGNOSIS — C50412 Malignant neoplasm of upper-outer quadrant of left female breast: Secondary | ICD-10-CM

## 2015-09-15 NOTE — Progress Notes (Signed)
[facility] Psychosocial Distress Screening Counseling Intern  Clinical Social Work was referred by distress screening protocol.  The patient scored a 1 on the Psychosocial Distress Thermometer which indicates Mild distress. Counseling Intern to assess for distress and other psychosocial needs.   ONCBCN DISTRESS SCREENING 09/13/2015  Screening Type Initial Screening  Distress experienced in past week (1-10) 1  Emotional problem type Nervousness/Anxiety  Referral to support programs Yes   Counseling Intern note: Met with Pt during Early. Pt reports very low level of distress but did note that this was a very different experience for her because she has always been a "strong and health" person. When asked about her support system, Pt stated that she is her own support system and has always been very self reliant, but does have some friends that can help her if she ever needs it. Pt appeared to be on the verge of tears once or twice during our encounter, but it was difficult to tell.  Counselor affirmed Pt strengths and provided validation for concerns and personal experience.  Counselor introduced and provided information about services and programs available in the family support center.  Pt stated that she would reach out as needed.   FU: No specific support center follow up indicated at this time.   Vaughan Sine Counseling Intern [phone]

## 2015-09-15 NOTE — Telephone Encounter (Signed)
Spoke with patient from [facility] 09/13/15.  She is doing well.  Encouraged her to call with any needs or concerns.

## 2015-09-18 ENCOUNTER — Telehealth: Payer: Self-pay | Admitting: *Deleted

## 2015-09-18 NOTE — Telephone Encounter (Signed)
error 

## 2015-09-18 NOTE — Pre-Procedure Instructions (Signed)
Renal hx. discussed with Dr. Al Corpus; pt. OK to come for surgery.

## 2015-09-19 ENCOUNTER — Ambulatory Visit
Admission: RE | Admit: 2015-09-19 | Discharge: 2015-09-19 | Disposition: A | Discharge status: Home / Self Care | Payer: Medicare Other | Source: Ambulatory Visit | Attending: General Surgery | Admitting: General Surgery

## 2015-09-19 ENCOUNTER — Telehealth: Payer: Self-pay | Admitting: Hematology and Oncology

## 2015-09-19 DIAGNOSIS — C50412 Malignant neoplasm of upper-outer quadrant of left female breast: Secondary | ICD-10-CM

## 2015-09-19 DIAGNOSIS — C50912 Malignant neoplasm of unspecified site of left female breast: Secondary | ICD-10-CM | POA: Diagnosis not present

## 2015-09-19 NOTE — Telephone Encounter (Signed)
Left message to inform patient of post op appt date/time per 2/24 pof.

## 2015-09-20 NOTE — H&P (Signed)
Kendra Watts  Location: [facility] Surgery Patient #: [id number] DOB: 1946-04-08 Undefined / Language: Kendra Watts / Race: White Female        History of Present Illness  The patient is a 71 year old female who presents with breast cancer. This is a 70 year old female, referred by Dr. Jetta Lout at the breast center of [facility] for evaluation and management of a small invasive ductal carcinoma of the left breast, upper outer quadrant. Dr. Ashby Dawes is her PCP, Dr. Quay Burow is her cardiologist, Dr. Jacelyn Pi is her endocrinologist. She is being evaluated in the [facility] today by Dr. Sonny Dandy, Dr. Sondra Come, and me. Yehuda Mao was present with me throughout the encounter.  4 mm mass in the left breast at the 1:00 position, 6 cm from the nipple. Biopsy clip is 6 mm medial to the lesion. Image guided biopsy shows grade 1 invasive ductal carcinoma. Estrogen receptor 100%, progesterone receptor 90%, Ki 67 10%, HER-2 negative. Her case was discussed in breast conference is morning and again at the clinic by the treating team above.  Comorbidities include vascular disease history right carotid endarterectomy and bilateral renal artery stents. She is on Plavix. Hypertension. Ongoing tobacco abuse. Hyperthyroidism, status post radioiodine ablation, appendectomy. There is breast cancer in a paternal aunt but no other family history. Father died of lung cancer. Sister died of lung cancer. Mother died of coronary artery disease.  She is retired. She continues to smoke. She is single. No children. 72-pack-years.  We had a long talk about the treatment of breast cancer in general and specifically about surgical options. I told her that surgical intervention was the first step in her care. We talked about mastectomy, lumpectomy, sentinel node biopsy, adjuvant radiation therapy, adjuvant hormone therapy, plastic surgical reconstruction. She prefers breast conservation and I think  she is a good candidate for that. We decided to proceed with left breast lumpectomy with radioactive seed localization.  She will be scheduled for left breast partial mastectomy with radioactive seed localization and left axillary sentinel node biopsy. I discussed the indications, details, techniques, and numerous risk of the surgery with her. She's aware of the risk of bleeding, infection, cosmetic deformity, second surgery for positive margins or positive nodes, skin necrosis, especially due to smoking, arm swelling, arm numbness, and other unforeseen problems. She understands all these issues well. At this time all of her questions are answered. She agrees with this plan.  She will need cardiac clearance with Dr. Quay Burow and we will need to take her off of her Plavix for 5 days preop and restart a couple of days later, if he approves.   Other Problems  Heart murmur High blood pressure Hypercholesterolemia Thyroid Disease  Past Surgical History  Appendectomy Carotid Artery Surgery Right. Hysterectomy (not due to cancer) - Partial  Diagnostic Studies History  Colonoscopy 1-5 years ago Mammogram within last year Pap Smear 1-5 years ago  Medication History  No Current Medications Medications Reconciled  Social History  Alcohol use Occasional alcohol use. Caffeine use Coffee. No drug use Tobacco use Current every day smoker.  Family History Alcohol Abuse Father, Sister. Heart Disease Mother. Heart disease in female family member before age 85 Hypertension Mother, Sister. Respiratory Condition Father, Sister.  Pregnancy / Birth History  Age at menarche 44 years.    Review of Systems  General Not Present- Appetite Loss, Chills, Fatigue, Fever, Night Sweats, Weight Gain and Weight Loss. Skin Not Present- Change in Wart/Mole, Dryness, Hives, Jaundice, New Lesions,  Non-Healing Wounds, Rash and Ulcer. HEENT Present- Wears glasses/contact  lenses. Not Present- Earache, Hearing Loss, Hoarseness, Nose Bleed, Oral Ulcers, Ringing in the Ears, Seasonal Allergies, Sinus Pain, Sore Throat, Visual Disturbances and Yellow Eyes. Respiratory Present- Chronic Cough. Not Present- Bloody sputum, Difficulty Breathing, Snoring and Wheezing. Breast Not Present- Breast Mass, Breast Pain, Nipple Discharge and Skin Changes. Cardiovascular Not Present- Chest Pain, Difficulty Breathing Lying Down, Leg Cramps, Palpitations, Rapid Heart Rate, Shortness of Breath and Swelling of Extremities. Gastrointestinal Not Present- Abdominal Pain, Bloating, Bloody Stool, Change in Bowel Habits, Chronic diarrhea, Constipation, Difficulty Swallowing, Excessive gas, Gets full quickly at meals, Hemorrhoids, Indigestion, Nausea, Rectal Pain and Vomiting. Female Genitourinary Not Present- Frequency, Nocturia, Painful Urination, Pelvic Pain and Urgency. Musculoskeletal Not Present- Back Pain, Joint Pain, Joint Stiffness, Muscle Pain, Muscle Weakness and Swelling of Extremities. Neurological Not Present- Decreased Memory, Fainting, Headaches, Numbness, Seizures, Tingling, Tremor, Trouble walking and Weakness. Psychiatric Not Present- Anxiety, Bipolar, Change in Sleep Pattern, Depression, Fearful and Frequent crying. Endocrine Not Present- Cold Intolerance, Excessive Hunger, Hair Changes, Heat Intolerance, Hot flashes and New Diabetes.   Physical Exam  General Mental Status-Alert. General Appearance-Consistent with stated age. Hydration-Well hydrated. Voice-Normal.  Head and Neck Head-normocephalic, atraumatic with no lesions or palpable masses. Trachea-midline. Thyroid Gland Characteristics - normal size and consistency. Note: Right neck scar from carotid endarterectomy   Eye Eyeball - Bilateral-Extraocular movements intact. Sclera/Conjunctiva - Bilateral-No scleral icterus.  Chest and Lung Exam Chest and lung exam reveals -quiet, even and  easy respiratory effort with no use of accessory muscles and on auscultation, normal breath sounds, no adventitious sounds and normal vocal resonance. Inspection Chest Wall - Normal. Back - normal.  Breast Note: Biopsy site with minimal ecchymoses and hematoma left breast upper outer quadrant. No other mass either breast. Nipple and areolar complexes look normal without discharge. No axillary mass.   Cardiovascular Cardiovascular examination reveals -normal heart sounds, regular rate and rhythm with no murmurs and normal pedal pulses bilaterally.  Abdomen Inspection Inspection of the abdomen reveals - No Hernias. Skin - Scar - no surgical scars. Palpation/Percussion Palpation and Percussion of the abdomen reveal - Soft, Non Tender, No Rebound tenderness, No Rigidity (guarding) and No hepatosplenomegaly. Auscultation Auscultation of the abdomen reveals - Bowel sounds normal.  Neurologic Neurologic evaluation reveals -alert and oriented x 3 with no impairment of recent or remote memory. Mental Status-Normal.  Musculoskeletal Normal Exam - Left-Upper Extremity Strength Normal and Lower Extremity Strength Normal. Normal Exam - Right-Upper Extremity Strength Normal and Lower Extremity Strength Normal.  Lymphatic Head & Neck  General Head & Neck Lymphatics: Bilateral - Description - Normal. Axillary  General Axillary Region: Bilateral - Description - Normal. Tenderness - Non Tender. Femoral & Inguinal  Generalized Femoral & Inguinal Lymphatics: Bilateral - Description - Normal. Tenderness - Non Tender.    Assessment & Plan  BREAST CANCER OF UPPER-OUTER QUADRANT OF LEFT FEMALE BREAST (C50.412)    Your recent imaging studies and biopsy show a small invasive ductal carcinoma of the left breast, upper outer quadrant This tumor is positive for estrogen and progesterone receptors. It is negative for HER-2. We have discussed surgical options for management including  mastectomy, lumpectomy, sentinel node biopsy, immediate or delayed plastic surgical reconstruction. We have decided to proceed with left breast lumpectomy with radioactive seed localization and sentinal node biopsy. You will most likely need radiation therapy and antiestrogen hormone therapy postop. Those decisions will be finallized post op. We have discussed the indications, techniques, and  numerous risks of the surgery Please read the printed information that we have given you  Dr. Darrel Hoover office will call you tomorrow to begin the scheduling process  We will need cardiac clearance from Dr. Quay Burow, and we will need his permission to stop the Plavix a full 5 days preop.  ANTICOAGULATED (Z79.01) Impression: Plavix. Followed by Dr. Adora Fridge RENAL ARTERY STENOSIS, NATIVE, BILATERAL (I70.1) Impression: Has bilateral stents CEREBROVASCULAR DISEASE (I67.9) Impression: History right carotid endarterectomy HISTORY OF RIGHT-SIDED CAROTID ENDARTERECTOMY (Z98.890) TOBACCO ABUSE (Z72.0) HYPERTENSION, BENIGN (I10) HISTORY OF RADIOACTIVE IODINE THYROID ABLATION (Z92.3) Impression: For hyperthyroidism    M. [name], M.D., [facility] General and Minimally invasive Surgery Breast and Colorectal Surgery Office:   [phone] Pager:   [phone]

## 2015-09-22 ENCOUNTER — Ambulatory Visit (HOSPITAL_BASED_OUTPATIENT_CLINIC_OR_DEPARTMENT_OTHER)
Admission: RE | Admit: 2015-09-22 | Discharge: 2015-09-22 | Disposition: A | Discharge status: Home / Self Care | Payer: Medicare Other | Source: Ambulatory Visit | Attending: General Surgery | Admitting: General Surgery

## 2015-09-22 ENCOUNTER — Ambulatory Visit (HOSPITAL_COMMUNITY)
Admission: RE | Admit: 2015-09-22 | Discharge: 2015-09-22 | Disposition: A | Discharge status: Home / Self Care | Payer: Medicare Other | Source: Ambulatory Visit | Attending: General Surgery | Admitting: General Surgery

## 2015-09-22 ENCOUNTER — Encounter (HOSPITAL_BASED_OUTPATIENT_CLINIC_OR_DEPARTMENT_OTHER)
Admission: RE | Disposition: A | Discharge status: Home / Self Care | Payer: Self-pay | Source: Ambulatory Visit | Attending: General Surgery

## 2015-09-22 ENCOUNTER — Encounter (HOSPITAL_BASED_OUTPATIENT_CLINIC_OR_DEPARTMENT_OTHER): Payer: Self-pay | Admitting: Anesthesiology

## 2015-09-22 ENCOUNTER — Ambulatory Visit
Admission: RE | Admit: 2015-09-22 | Discharge: 2015-09-22 | Disposition: A | Discharge status: Home / Self Care | Payer: Medicare Other | Source: Ambulatory Visit | Attending: General Surgery | Admitting: General Surgery

## 2015-09-22 ENCOUNTER — Ambulatory Visit (HOSPITAL_BASED_OUTPATIENT_CLINIC_OR_DEPARTMENT_OTHER): Payer: Medicare Other | Admitting: Anesthesiology

## 2015-09-22 DIAGNOSIS — I10 Essential (primary) hypertension: Secondary | ICD-10-CM | POA: Diagnosis not present

## 2015-09-22 DIAGNOSIS — C50412 Malignant neoplasm of upper-outer quadrant of left female breast: Secondary | ICD-10-CM | POA: Diagnosis not present

## 2015-09-22 DIAGNOSIS — Z7982 Long term (current) use of aspirin: Secondary | ICD-10-CM | POA: Diagnosis not present

## 2015-09-22 DIAGNOSIS — Z801 Family history of malignant neoplasm of trachea, bronchus and lung: Secondary | ICD-10-CM | POA: Insufficient documentation

## 2015-09-22 DIAGNOSIS — E7800 Pure hypercholesterolemia, unspecified: Secondary | ICD-10-CM | POA: Insufficient documentation

## 2015-09-22 DIAGNOSIS — C50919 Malignant neoplasm of unspecified site of unspecified female breast: Secondary | ICD-10-CM

## 2015-09-22 DIAGNOSIS — R079 Chest pain, unspecified: Secondary | ICD-10-CM | POA: Diagnosis not present

## 2015-09-22 DIAGNOSIS — Z170 Estrogen receptor positive status [ER+]: Secondary | ICD-10-CM | POA: Diagnosis not present

## 2015-09-22 DIAGNOSIS — F17210 Nicotine dependence, cigarettes, uncomplicated: Secondary | ICD-10-CM | POA: Insufficient documentation

## 2015-09-22 DIAGNOSIS — Z803 Family history of malignant neoplasm of breast: Secondary | ICD-10-CM | POA: Insufficient documentation

## 2015-09-22 DIAGNOSIS — Z7902 Long term (current) use of antithrombotics/antiplatelets: Secondary | ICD-10-CM | POA: Diagnosis not present

## 2015-09-22 DIAGNOSIS — Z79899 Other long term (current) drug therapy: Secondary | ICD-10-CM | POA: Diagnosis not present

## 2015-09-22 DIAGNOSIS — C50912 Malignant neoplasm of unspecified site of left female breast: Secondary | ICD-10-CM | POA: Diagnosis not present

## 2015-09-22 DIAGNOSIS — E039 Hypothyroidism, unspecified: Secondary | ICD-10-CM | POA: Insufficient documentation

## 2015-09-22 DIAGNOSIS — I739 Peripheral vascular disease, unspecified: Secondary | ICD-10-CM | POA: Diagnosis not present

## 2015-09-22 DIAGNOSIS — G8918 Other acute postprocedural pain: Secondary | ICD-10-CM | POA: Diagnosis not present

## 2015-09-22 HISTORY — DX: Presence of dental prosthetic device (complete) (partial): Z97.2

## 2015-09-22 HISTORY — PX: RADIOACTIVE SEED GUIDED PARTIAL MASTECTOMY WITH AXILLARY SENTINEL LYMPH NODE BIOPSY: SHX6520

## 2015-09-22 HISTORY — DX: Disorder of kidney and ureter, unspecified: N28.9

## 2015-09-22 HISTORY — DX: Personal history of other endocrine, nutritional and metabolic disease: Z86.39

## 2015-09-22 HISTORY — PX: BREAST LUMPECTOMY: SHX2

## 2015-09-22 HISTORY — DX: Unspecified cataract: H26.9

## 2015-09-22 HISTORY — DX: Atherosclerosis of renal artery: I70.1

## 2015-09-22 HISTORY — DX: Malignant neoplasm of unspecified site of unspecified female breast: C50.919

## 2015-09-22 SURGERY — RADIOACTIVE SEED GUIDED PARTIAL MASTECTOMY WITH AXILLARY SENTINEL LYMPH NODE BIOPSY
Anesthesia: Regional | Site: Breast | Laterality: Left

## 2015-09-22 MED ORDER — PROPOFOL 10 MG/ML IV BOLUS
INTRAVENOUS | Status: DC | PRN
  Administered 2015-09-22: 100 mg via INTRAVENOUS

## 2015-09-22 MED ORDER — ONDANSETRON HCL 4 MG/2ML IJ SOLN
INTRAMUSCULAR | Status: AC
  Filled 2015-09-22: qty 2

## 2015-09-22 MED ORDER — CEFAZOLIN SODIUM-DEXTROSE 2-3 GM-% IV SOLR
INTRAVENOUS | Status: AC
  Filled 2015-09-22: qty 50

## 2015-09-22 MED ORDER — FENTANYL CITRATE (PF) 100 MCG/2ML IJ SOLN: 25.0000 ug | INTRAMUSCULAR | Status: DC | PRN

## 2015-09-22 MED ORDER — HYDROCODONE-ACETAMINOPHEN 5-325 MG PO TABS: 1.0000 | ORAL_TABLET | Freq: Four times a day (QID) | ORAL | Status: DC | PRN

## 2015-09-22 MED ORDER — GLYCOPYRROLATE 0.2 MG/ML IJ SOLN
0.2000 mg | Freq: Once | INTRAMUSCULAR | Status: AC | PRN
  Administered 2015-09-22: 0.1 mg via INTRAVENOUS

## 2015-09-22 MED ORDER — CEFAZOLIN SODIUM-DEXTROSE 2-3 GM-% IV SOLR
2.0000 g | INTRAVENOUS | Status: AC
  Administered 2015-09-22: 2 g via INTRAVENOUS

## 2015-09-22 MED ORDER — BUPIVACAINE-EPINEPHRINE (PF) 0.5% -1:200000 IJ SOLN
INTRAMUSCULAR | Status: DC | PRN
  Administered 2015-09-22: 15 mL

## 2015-09-22 MED ORDER — MIDAZOLAM HCL 2 MG/2ML IJ SOLN
INTRAMUSCULAR | Status: AC
  Filled 2015-09-22: qty 2

## 2015-09-22 MED ORDER — MIDAZOLAM HCL 2 MG/2ML IJ SOLN: 1.0000 mg | INTRAMUSCULAR | Status: DC | PRN

## 2015-09-22 MED ORDER — SODIUM CHLORIDE 0.9 % IJ SOLN
INTRAVENOUS | Status: DC | PRN
  Administered 2015-09-22: 5 mL

## 2015-09-22 MED ORDER — CHLORHEXIDINE GLUCONATE 4 % EX LIQD: 1.0000 "application " | Freq: Once | CUTANEOUS | Status: DC

## 2015-09-22 MED ORDER — FENTANYL CITRATE (PF) 100 MCG/2ML IJ SOLN
INTRAMUSCULAR | Status: AC
  Filled 2015-09-22: qty 2

## 2015-09-22 MED ORDER — LIDOCAINE HCL (CARDIAC) 20 MG/ML IV SOLN
INTRAVENOUS | Status: AC
  Filled 2015-09-22: qty 5

## 2015-09-22 MED ORDER — LACTATED RINGERS IV SOLN
INTRAVENOUS | Status: DC
  Administered 2015-09-22 (×2): via INTRAVENOUS

## 2015-09-22 MED ORDER — PROPOFOL 10 MG/ML IV BOLUS
INTRAVENOUS | Status: AC
  Filled 2015-09-22: qty 40

## 2015-09-22 MED ORDER — ONDANSETRON HCL 4 MG/2ML IJ SOLN
INTRAMUSCULAR | Status: DC | PRN
  Administered 2015-09-22: 4 mg via INTRAVENOUS

## 2015-09-22 MED ORDER — LIDOCAINE HCL (CARDIAC) 20 MG/ML IV SOLN
INTRAVENOUS | Status: DC | PRN
  Administered 2015-09-22: 20 mg via INTRAVENOUS

## 2015-09-22 MED ORDER — EPHEDRINE SULFATE 50 MG/ML IJ SOLN
INTRAMUSCULAR | Status: DC | PRN
  Administered 2015-09-22: 5 mg via INTRAVENOUS
  Administered 2015-09-22: 10 mg via INTRAVENOUS

## 2015-09-22 MED ORDER — BUPIVACAINE-EPINEPHRINE (PF) 0.5% -1:200000 IJ SOLN
INTRAMUSCULAR | Status: DC | PRN
  Administered 2015-09-22: 20 mL

## 2015-09-22 MED ORDER — PHENYLEPHRINE HCL 10 MG/ML IJ SOLN
INTRAMUSCULAR | Status: AC
  Filled 2015-09-22: qty 1

## 2015-09-22 MED ORDER — PHENYLEPHRINE HCL 10 MG/ML IJ SOLN
10.0000 mg | INTRAVENOUS | Status: DC | PRN
  Administered 2015-09-22: 50 ug/min via INTRAVENOUS

## 2015-09-22 MED ORDER — EPHEDRINE SULFATE 50 MG/ML IJ SOLN
INTRAMUSCULAR | Status: AC
  Filled 2015-09-22: qty 1

## 2015-09-22 MED ORDER — DEXAMETHASONE SODIUM PHOSPHATE 4 MG/ML IJ SOLN
INTRAMUSCULAR | Status: DC | PRN
  Administered 2015-09-22: 6 mg via INTRAVENOUS

## 2015-09-22 MED ORDER — GLYCOPYRROLATE 0.2 MG/ML IJ SOLN
INTRAMUSCULAR | Status: AC
  Filled 2015-09-22: qty 1

## 2015-09-22 MED ORDER — PROMETHAZINE HCL 25 MG/ML IJ SOLN: 6.2500 mg | INTRAMUSCULAR | Status: DC | PRN

## 2015-09-22 MED ORDER — DEXAMETHASONE SODIUM PHOSPHATE 10 MG/ML IJ SOLN
INTRAMUSCULAR | Status: AC
  Filled 2015-09-22: qty 1

## 2015-09-22 MED ORDER — SODIUM CHLORIDE 0.9 % IJ SOLN
INTRAMUSCULAR | Status: AC
  Filled 2015-09-22: qty 10

## 2015-09-22 MED ORDER — FENTANYL CITRATE (PF) 100 MCG/2ML IJ SOLN
50.0000 ug | INTRAMUSCULAR | Status: DC | PRN
  Administered 2015-09-22: 50 ug via INTRAVENOUS

## 2015-09-22 MED ORDER — METHYLENE BLUE 0.5 % INJ SOLN
INTRAVENOUS | Status: AC
  Filled 2015-09-22: qty 10

## 2015-09-22 MED ORDER — SCOPOLAMINE 1 MG/3DAYS TD PT72: 1.0000 | MEDICATED_PATCH | Freq: Once | TRANSDERMAL | Status: DC | PRN

## 2015-09-22 MED ORDER — BUPIVACAINE-EPINEPHRINE (PF) 0.5% -1:200000 IJ SOLN
INTRAMUSCULAR | Status: AC
  Filled 2015-09-22: qty 30

## 2015-09-22 MED ORDER — TECHNETIUM TC 99M SULFUR COLLOID FILTERED
1.0000 | Freq: Once | INTRAVENOUS | Status: AC | PRN
  Administered 2015-09-22: 1 via INTRADERMAL

## 2015-09-22 SURGICAL SUPPLY — 64 items
APPLIER CLIP 9.375 MED OPEN (MISCELLANEOUS) ×2
BENZOIN TINCTURE PRP APPL 2/3 (GAUZE/BANDAGES/DRESSINGS) IMPLANT
BINDER BREAST LRG (GAUZE/BANDAGES/DRESSINGS) IMPLANT
BINDER BREAST MEDIUM (GAUZE/BANDAGES/DRESSINGS) ×2 IMPLANT
BINDER BREAST XLRG (GAUZE/BANDAGES/DRESSINGS) IMPLANT
BINDER BREAST XXLRG (GAUZE/BANDAGES/DRESSINGS) IMPLANT
BLADE HEX COATED 2.75 (ELECTRODE) ×2 IMPLANT
BLADE SURG 10 STRL SS (BLADE) IMPLANT
BLADE SURG 15 STRL LF DISP TIS (BLADE) ×1 IMPLANT
BLADE SURG 15 STRL SS (BLADE) ×1
CANISTER SUC SOCK COL 7IN (MISCELLANEOUS) IMPLANT
CANISTER SUCT 1200ML W/VALVE (MISCELLANEOUS) ×2 IMPLANT
CHLORAPREP W/TINT 26ML (MISCELLANEOUS) ×2 IMPLANT
CLIP APPLIE 9.375 MED OPEN (MISCELLANEOUS) ×1 IMPLANT
COVER BACK TABLE 60X90IN (DRAPES) ×2 IMPLANT
COVER MAYO STAND STRL (DRAPES) ×2 IMPLANT
COVER PROBE W GEL 5X96 (DRAPES) ×2 IMPLANT
DECANTER SPIKE VIAL GLASS SM (MISCELLANEOUS) IMPLANT
DERMABOND ADVANCED (GAUZE/BANDAGES/DRESSINGS) ×1
DERMABOND ADVANCED .7 DNX12 (GAUZE/BANDAGES/DRESSINGS) ×1 IMPLANT
DEVICE DUBIN W/COMP PLATE 8390 (MISCELLANEOUS) ×2 IMPLANT
DRAPE LAPAROSCOPIC ABDOMINAL (DRAPES) ×2 IMPLANT
DRAPE UTILITY XL STRL (DRAPES) ×2 IMPLANT
DRSG PAD ABDOMINAL 8X10 ST (GAUZE/BANDAGES/DRESSINGS) IMPLANT
ELECT REM PT RETURN 9FT ADLT (ELECTROSURGICAL) ×2
ELECTRODE REM PT RTRN 9FT ADLT (ELECTROSURGICAL) ×1 IMPLANT
GAUZE SPONGE 4X4 12PLY STRL (GAUZE/BANDAGES/DRESSINGS) IMPLANT
GLOVE BIOGEL PI IND STRL 7.0 (GLOVE) ×2 IMPLANT
GLOVE BIOGEL PI IND STRL 8 (GLOVE) ×1 IMPLANT
GLOVE BIOGEL PI INDICATOR 7.0 (GLOVE) ×2
GLOVE BIOGEL PI INDICATOR 8 (GLOVE) ×1
GLOVE ECLIPSE 6.5 STRL STRAW (GLOVE) ×2 IMPLANT
GLOVE EUDERMIC 7 POWDERFREE (GLOVE) ×2 IMPLANT
GLOVE EXAM NITRILE EXT CUFF MD (GLOVE) ×2 IMPLANT
GLOVE SURG SS PI 6.5 STRL IVOR (GLOVE) ×2 IMPLANT
GLOVE SURG SS PI 7.5 STRL IVOR (GLOVE) ×2 IMPLANT
GOWN STRL REUS W/ TWL LRG LVL3 (GOWN DISPOSABLE) ×3 IMPLANT
GOWN STRL REUS W/ TWL XL LVL3 (GOWN DISPOSABLE) ×1 IMPLANT
GOWN STRL REUS W/TWL LRG LVL3 (GOWN DISPOSABLE) ×3
GOWN STRL REUS W/TWL XL LVL3 (GOWN DISPOSABLE) ×1
KIT MARKER MARGIN INK (KITS) ×2 IMPLANT
NDL SAFETY ECLIPSE 18X1.5 (NEEDLE) ×1 IMPLANT
NEEDLE HYPO 18GX1.5 SHARP (NEEDLE) ×1
NEEDLE HYPO 25X1 1.5 SAFETY (NEEDLE) ×4 IMPLANT
NS IRRIG 1000ML POUR BTL (IV SOLUTION) ×2 IMPLANT
PACK BASIN DAY SURGERY FS (CUSTOM PROCEDURE TRAY) ×2 IMPLANT
PENCIL BUTTON HOLSTER BLD 10FT (ELECTRODE) ×2 IMPLANT
SHEET MEDIUM DRAPE 40X70 STRL (DRAPES) IMPLANT
SLEEVE SCD COMPRESS KNEE MED (MISCELLANEOUS) ×2 IMPLANT
SPONGE LAP 18X18 X RAY DECT (DISPOSABLE) IMPLANT
SPONGE LAP 4X18 X RAY DECT (DISPOSABLE) ×4 IMPLANT
STRIP CLOSURE SKIN 1/2X4 (GAUZE/BANDAGES/DRESSINGS) IMPLANT
SUT MNCRL AB 4-0 PS2 18 (SUTURE) ×4 IMPLANT
SUT SILK 2 0 SH (SUTURE) ×2 IMPLANT
SUT VIC AB 2-0 CT1 27 (SUTURE)
SUT VIC AB 2-0 CT1 TAPERPNT 27 (SUTURE) IMPLANT
SUT VIC AB 3-0 SH 27 (SUTURE)
SUT VIC AB 3-0 SH 27X BRD (SUTURE) IMPLANT
SUT VICRYL 3-0 CR8 SH (SUTURE) ×2 IMPLANT
SYRINGE 10CC LL (SYRINGE) ×4 IMPLANT
TOWEL OR 17X24 6PK STRL BLUE (TOWEL DISPOSABLE) ×2 IMPLANT
TOWEL OR NON WOVEN STRL DISP B (DISPOSABLE) ×2 IMPLANT
TUBE CONNECTING 20X1/4 (TUBING) ×2 IMPLANT
YANKAUER SUCT BULB TIP NO VENT (SUCTIONS) ×2 IMPLANT

## 2015-09-22 NOTE — Transfer of Care (Signed)
Immediate Anesthesia Transfer of Care Note  Patient: Kendra Watts [facility]  Procedure(s) Performed: Procedure(s): INJECT BLUE DYE LEFT BREAST,RADIOACTIVE SEED GUIDED PARTIAL MASTECTOMY WITH AXILLARY SENTINEL LYMPH NODE BIOPSY (Left)  Patient Location: PACU  Anesthesia Type:General, block for post op pain control  Level of Consciousness: awake, alert  and patient cooperative  Airway & Oxygen Therapy: Patient Spontanous Breathing and Patient connected to face mask oxygen  Post-op Assessment: Report given to RN, Post -op Vital signs reviewed and stable and Patient moving all extremities  Post vital signs: Reviewed and stable  Last Vitals:  Filed Vitals:   09/22/15 0945 09/22/15 0947  BP: 126/63   Pulse: 80 88  Temp:    Resp: 17 18    Complications: No apparent anesthesia complications

## 2015-09-22 NOTE — Op Note (Signed)
Patient Name:           Kendra Watts   Date of Surgery:        09/22/2015  Pre op Diagnosis:      Invasive ductal carcinoma left breast, upper outer quadrant, estrogen receptor positive, HER-2 negative.  Clinical stage IA.  Post op Diagnosis:    Same  Procedure:                 Inject blue dye left breast, left partial mastectomy with radioactive seed localization, left axillary sentinel lymph node biopsy   Surgeon:                     Edsel Petrin. [name], M.D., FACS  Assistant:                      OR staff  Operative Indications:   This is a 70 year old female, referred by Dr. Jetta Lout at the breast center of [facility] for evaluation and management of a small invasive ductal carcinoma of the left breast, upper outer quadrant. Dr. Ashby Dawes is her PCP, Dr. Quay Burow is her cardiologist, Dr. Jacelyn Pi is her endocrinologist. She was  evaluated in the [facility] recently by Dr. Sonny Dandy, Dr. Sondra Come, and me.       Imaging studies revealed a 4 mm mass in the left breast at the 1:00 position, 6 cm from the nipple. Biopsy clip is 6 mm medial to the lesion. Image guided biopsy shows grade 1 invasive ductal carcinoma. Estrogen receptor 100%, progesterone receptor 90%, Ki 67 10%, HER-2 negative. .       Comorbidities include vascular disease history right carotid endarterectomy and bilateral renal artery stents. She is on Plavix. Hypertension. Ongoing tobacco abuse. Hyperthyroidism, status post radioiodine ablation, appendectomy. There is breast cancer in a paternal aunt but no other family history. Father died of lung cancer. Sister died of lung cancer. Mother died of coronary artery disease.       She is retired. She continues to smoke. She is single. No children. 72-pack-years.      We had a long talk about the treatment of breast cancer in general and specifically about surgical options. I told her that surgical intervention was the first step in her care. We talked about  mastectomy, lumpectomy, sentinel node biopsy, adjuvant radiation therapy, adjuvant hormone therapy, plastic surgical reconstruction. She prefers breast conservation and I think she is a good candidate for that. We decided to proceed with left breast lumpectomy with radioactive seed localization.     She states that she has been off of her Plavix for 5 days.  Operative Findings:       The radioactive seed was placed by the radiologist recently.  In the holding area I could hear the audible sentinel of the radioactive seed in the left breast upper outer quadrant using the neoprobe.  Technetium 99 was injected into the left breast by the nuclear medicine technician.  A left pectoral block was performed by the  anesthesiologist.  In the operating room,  the specimen mammogram looked good showing the marker clip and the radioactive seed within the specimen.  The tumor was relatively superficial.  I found 3 sentinel lymph nodes.  Procedure in Detail:          Following the induction of general LMA anesthesia a surgical timeout was performed, intravenous antibiotics were given.  Following alcohol prep I injected 5 mL of blue dye into the left  breast, subareolar area and massage the breast for a few minutes.  This was methylene blue mixed with saline.    The left breast chest wall and axilla were prepped and draped in a sterile fashion.  0.5% Marcaine with epinephrine was used as local infiltration anesthetic.  Using the neoprobe as a guide I identified the area of radioactivity and made a curvilinear circumareolar incision high in the upper outer left breast.  Dissection was carried down into the breast tissue around the radioactivity.  The specimen was removed and marked with silk sutures and a 6 color ink kit to orient the pathologist.  Specimen mammogram looked good as it appeared we had adequate margins.  Specimen was sent to the lab.  A transverse incision was made in the left axilla at the hairline.   Dissection was carried down through the clavipectoral fascia into the axillary space.  Using the neoprobe I identified 3 sentinel lymph nodes which had strong radioactivity.  These were each removed and sent separately.  After this was completed there was no other significant radioactivity.      Both wounds were irrigated.  Hemostasis was excellent.  Metal marker clips were placed in the lumpectomy cavity to orient the radiation oncologist.  The incisions were closed in layers with interrupted 3-0 Vicryl and the skin incisions were closed with running subcuticular sutures of 4-0 Monocryl and Dermabond.  Breast binder was placed and the patient taken to PACU in stable condition.  EBL less than 20 mL.  Counts correct.  Complications none.     Edsel Petrin. [name], M.D., FACS General and Minimally Invasive Surgery Breast and Colorectal Surgery  09/22/2015 11:35 AM

## 2015-09-22 NOTE — Progress Notes (Signed)
Assisted Dr. Rodman Comp with left, ultrasound guided, pectoralis block. Side rails up, monitors on throughout procedure. See vital signs in flow sheet. Tolerated Procedure well.

## 2015-09-22 NOTE — Anesthesia Procedure Notes (Addendum)
Anesthesia Regional Block:  Pectoralis block  Pre-Anesthetic Checklist: ,, timeout performed, Correct Patient, Correct Site, Correct Laterality, Correct Procedure, Correct Position, site marked, Risks and benefits discussed,  Surgical consent,  Pre-op evaluation,  At surgeon's request and post-op pain management  Laterality: Left  Prep: chloraprep       Needles:   Needle Type: Echogenic Needle     Needle Length: 9cm 9 cm Needle Gauge: 21 and 21 G    Additional Needles:  Procedures: ultrasound guided (picture in chart) Pectoralis block Narrative:  Start time: 09/22/2015 9:20 AM End time: 09/22/2015 9:28 AM Injection made incrementally with aspirations every 5 mL.  Performed by: Personally  Anesthesiologist: Suzette Battiest  Additional Notes: Risks and benefits discussed. Pt tolerated procedure well with no immediate complications.   Procedure Name: LMA Insertion Date/Time: 09/22/2015 10:33 AM Performed by: Baxter Flattery Pre-anesthesia Checklist: Patient identified, Emergency Drugs available, Suction available and Patient being monitored Patient Re-evaluated:Patient Re-evaluated prior to inductionOxygen Delivery Method: Circle System Utilized Preoxygenation: Pre-oxygenation with 100% oxygen Intubation Type: IV induction Ventilation: Mask ventilation without difficulty LMA: LMA inserted LMA Size: 3.0 Number of attempts: 1 Airway Equipment and Method: Bite block Placement Confirmation: positive ETCO2 and breath sounds checked- equal and bilateral Tube secured with: Tape Dental Injury: Teeth and Oropharynx as per pre-operative assessment

## 2015-09-22 NOTE — Discharge Instructions (Signed)
[place] Office Phone Number [phone]  BREAST BIOPSY/ Lumpectomy: POST OP INSTRUCTIONS  Always review your discharge instruction sheet given to you by the facility where your surgery was performed.  IF YOU HAVE DISABILITY OR FAMILY LEAVE FORMS, YOU MUST BRING THEM TO THE OFFICE FOR PROCESSING.  DO NOT GIVE THEM TO YOUR DOCTOR.  1. A prescription for pain medication may be given to you upon discharge.  Take your pain medication as prescribed, if needed.  If narcotic pain medicine is not needed, then you may take acetaminophen (Tylenol) or ibuprofen (Advil) as needed. 2. Take your usually prescribed medications unless otherwise directed 3. If you need a refill on your pain medication, please contact your pharmacy.  They will contact our office to request authorization.  Prescriptions will not be filled after 5pm or on week-ends. 4. You should eat very light the first 24 hours after surgery, such as soup, crackers, pudding, etc.  Resume your normal diet the day after surgery. 5. Most patients will experience some swelling and bruising in the breast.  Ice packs and a good support bra will help.  Swelling and bruising can take several days to resolve.  6. It is common to experience some constipation if taking pain medication after surgery.  Increasing fluid intake and taking a stool softener will usually help or prevent this problem from occurring.  A mild laxative (Milk of Magnesia or Miralax) should be taken according to package directions if there are no bowel movements after 48 hours. 7. Unless discharge instructions indicate otherwise, you may remove your bandages 24-48 hours after surgery, and you may shower at that time.  You may have steri-strips (small skin tapes) in place directly over the incision.  These strips should be left on the skin for 7-10 days.  If your surgeon used skin glue on the incision, you may shower in 24 hours.  The glue will flake off over the next 2-3  weeks.  Any sutures or staples will be removed at the office during your follow-up visit. 8. ACTIVITIES:  You may resume regular daily activities (gradually increasing) beginning the next day.  Wearing a good support bra or sports bra minimizes pain and swelling.  You may have sexual intercourse when it is comfortable. a. You may drive when you no longer are taking prescription pain medication, you can comfortably wear a seatbelt, and you can safely maneuver your car and apply brakes. b. RETURN TO WORK:  ______________________________________________________________________________________ 9. You should see your doctor in the office for a follow-up appointment approximately two weeks after your surgery.  Your doctors nurse will typically make your follow-up appointment when she calls you with your pathology report.  Expect your pathology report 2-3 business days after your surgery.  You may call to check if you do not hear from [place] after three days. WHEN TO CALL YOUR DOCTOR: 1. Fever over 101.0 2. Nausea and/or vomiting. 3. Extreme swelling or bruising. 4. Continued bleeding from incision. 5. Increased pain, redness, or drainage from the incision.  The clinic staff is available to answer your questions during regular business hours.  Please dont hesitate to call and ask to speak to one of the nurses for clinical concerns.  If you have a medical emergency, go to the nearest emergency room or call 911.  A surgeon from [facility] is always on call at the hospital.  For further questions, please visit [email or web address]     Post Anesthesia Home Care Instructions  Activity:  Get plenty of rest for the remainder of the day. A responsible adult should stay with you for 24 hours following the procedure.  For the next 24 hours, DO NOT: -Drive a car -Paediatric nurse -Drink alcoholic beverages -Take any medication unless instructed by your physician -Make any legal decisions  or sign important papers.  Meals: Start with liquid foods such as gelatin or soup. Progress to regular foods as tolerated. Avoid greasy, spicy, heavy foods. If nausea and/or vomiting occur, drink only clear liquids until the nausea and/or vomiting subsides. Call your physician if vomiting continues.  Special Instructions/Symptoms: Your throat may feel dry or sore from the anesthesia or the breathing tube placed in your throat during surgery. If this causes discomfort, gargle with warm salt water. The discomfort should disappear within 24 hours.  If you had a scopolamine patch placed behind your ear for the management of post- operative nausea and/or vomiting:  1. The medication in the patch is effective for 72 hours, after which it should be removed.  Wrap patch in a tissue and discard in the trash. Wash hands thoroughly with soap and water. 2. You may remove the patch earlier than 72 hours if you experience unpleasant side effects which may include dry mouth, dizziness or visual disturbances. 3. Avoid touching the patch. Wash your hands with soap and water after contact with the patch.

## 2015-09-22 NOTE — Anesthesia Preprocedure Evaluation (Addendum)
Anesthesia Evaluation  Patient identified by MRN, date of birth, ID band Patient awake    Reviewed: Allergy & Precautions, NPO status , Patient's Chart, lab work & pertinent test results  Airway Mallampati: II  TM Distance: >3 FB Neck ROM: Full    Dental  (+) Dental Advisory Given   Pulmonary Current Smoker,    breath sounds clear to auscultation       Cardiovascular hypertension, Pt. on medications + Peripheral Vascular Disease   Rhythm:Regular Rate:Normal     Neuro/Psych negative neurological ROS     GI/Hepatic negative GI ROS, Neg liver ROS,   Endo/Other  Hypothyroidism   Renal/GU Renal disease     Musculoskeletal   Abdominal   Peds  Hematology negative hematology ROS (+)   Anesthesia Other Findings   Reproductive/Obstetrics                            Lab Results  Component Value Date   WBC 7.4 09/13/2015   HGB 14.4 09/13/2015   HCT 43.9 09/13/2015   MCV 96.3 09/13/2015   PLT 212 09/13/2015   Lab Results  Component Value Date   CREATININE 1.0 09/13/2015   BUN 14.8 09/13/2015   NA 139 09/13/2015   K 4.1 09/13/2015   CL 107 08/23/2014   CO2 22 09/13/2015    Anesthesia Physical Anesthesia Plan  ASA: III  Anesthesia Plan: General and Regional   Post-op Pain Management:    Induction: Intravenous  Airway Management Planned: LMA  Additional Equipment:   Intra-op Plan:   Post-operative Plan: Extubation in OR  Informed Consent: I have reviewed the patients History and Physical, chart, labs and discussed the procedure including the risks, benefits and alternatives for the proposed anesthesia with the patient or authorized representative who has indicated his/her understanding and acceptance.   Dental advisory given  Plan Discussed with: CRNA  Anesthesia Plan Comments:         Anesthesia Quick Evaluation

## 2015-09-22 NOTE — Progress Notes (Signed)
Nuc med staff performed nuc med inj. No additional sedation required. Pt tol well. VSS (see flowsheet). Pt doesn't want friend Marcie Bal) to come back at this time.

## 2015-09-22 NOTE — Anesthesia Postprocedure Evaluation (Signed)
Anesthesia Post Note  Patient: Kendra Watts [facility]  Procedure(s) Performed: Procedure(s) (LRB): INJECT BLUE DYE LEFT BREAST,RADIOACTIVE SEED GUIDED PARTIAL MASTECTOMY WITH AXILLARY SENTINEL LYMPH NODE BIOPSY (Left)  Patient location during evaluation: PACU Anesthesia Type: General and Regional Level of consciousness: awake and alert Pain management: pain level controlled Vital Signs Assessment: post-procedure vital signs reviewed and stable Respiratory status: spontaneous breathing, nonlabored ventilation, respiratory function stable and patient connected to nasal cannula oxygen Cardiovascular status: blood pressure returned to baseline and stable Postop Assessment: no signs of nausea or vomiting Anesthetic complications: no    Last Vitals:  Filed Vitals:   09/22/15 1245 09/22/15 1330  BP: 130/72 108/60  Pulse: 72 78  Temp:  36.9 C  Resp: 16 18    Last Pain:  Filed Vitals:   09/22/15 1350  PainSc: 0-No pain                 Tiajuana Amass

## 2015-09-22 NOTE — Interval H&P Note (Signed)
History and Physical Interval Note:  09/22/2015 9:57 AM  Kendra Watts  has presented today for surgery, with the diagnosis of LEFT BREAST CANCER UPPER OUTER QUADRANT  The various methods of treatment have been discussed with the patient and family. After consideration of risks, benefits and other options for treatment, the patient has consented to  Procedure(s): [place] WITH AXILLARY SENTINEL LYMPH NODE BIOPSY (Left) as a surgical intervention .  The patient's history has been reviewed, patient examined, no change in status, stable for surgery.  I have reviewed the patient's chart and labs.  Questions were answered to the patient's satisfaction.     Adin Hector

## 2015-09-25 ENCOUNTER — Encounter (HOSPITAL_BASED_OUTPATIENT_CLINIC_OR_DEPARTMENT_OTHER): Payer: Self-pay | Admitting: General Surgery

## 2015-09-26 ENCOUNTER — Telehealth: Payer: Self-pay | Admitting: Hematology and Oncology

## 2015-09-26 NOTE — Telephone Encounter (Signed)
Spoke withpatient to confirm appt change date/time. Pt is aware that radonc appt my overlap with Dr [name] appt.

## 2015-09-28 ENCOUNTER — Telehealth: Payer: Self-pay

## 2015-09-28 NOTE — Telephone Encounter (Signed)
Requesting surgical clearance:   1. Type of surgery: left partial mastectomy with RSL and left axillary sentinel node biopsy  2. Surgeon: Dr. [name]  3. Surgical date: pending clearance  4. Medications that need to be held: Plavix  5. CAD: No     6. I will defer to: [facility] Attn; Yehuda Mao Fax [phone] Phone [phone]

## 2015-09-29 NOTE — Telephone Encounter (Signed)
Okay to interrupt antiplatelet therapy for her procedure

## 2015-09-29 NOTE — Telephone Encounter (Signed)
Pt cleared at low risk and clearance sent to [facility].

## 2015-09-29 NOTE — Progress Notes (Signed)
Location of Breast Cancer: LEFT BREAST CANCER UPPER OUTER QUADRANT   Histology per Pathology Report:   09/22/15 Diagnosis 1. Breast, lumpectomy, left - INVASIVE DUCTAL CARCINOMA, GRADE I/III, SPANNING 1.1 CM. 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 - THE SURGICAL RESECTION MARGINS ARE NEGATIVE FOR DUCTAL CARCINOMA. - SEE ONCOLOGY TABLE BELOW. 2. Lymph node, sentinel, biopsy, left axillary #1 - THERE IS NO EVIDENCE OF CARCINOMA IN 1 OF 1 LYMPH NODE (0/1). 3. Lymph node, sentinel, biopsy, left axillary #2 - THERE IS NO EVIDENCE OF CARCINOMA IN 1 OF 1 LYMPH NODE (0/1). 4. Lymph node, sentinel, biopsy, left axillary #3 - THERE IS NO EVIDENCE OF CARCINOMA IN 1 OF 1 LYMPH NODE (0/1).  09/07/15 Diagnosis Breast, left, needle core biopsy, 1 o'clock - INVASIVE DUCTAL CARCINOMA.  Receptor Status: ER(100%), PR (90%), Her2-neu (negative)  Did patient present with symptoms (if so, please note symptoms) or was this found on screening mammography?: screening mammogram   Past/Anticipated interventions by surgeon, if any: 09/22/15 - Procedure: INJECT BLUE DYE LEFT BREAST,RADIOACTIVE SEED GUIDED PARTIAL MASTECTOMY WITH AXILLARY SENTINEL LYMPH NODE BIOPSY;  Surgeon: Fanny Skates, MD;  Location: [place];  Service: General;  Laterality: Left;  Past/Anticipated interventions by medical oncology, if any: Per [name] "1. breast conserving surgery with sentinel lymph node biopsy. 2. Adjuvant radiation therapy. 3. Followed by antiestrogen therapy."   Lymphedema issues, if any: no  Pain issues, if any:  Has tenderness under her left arm.  She reports that she felt a swollen area yesterday under her left arm after raising her left arm.  OB Gyn history: She menarched at early age of 44 and went to menopause at age 39. She had no children. She has not received birth control pills. She was never exposed to fertility  medications or hormone replacement therapy. She has no family history of Breast/GYN/GI cancer  SAFETY ISSUES:  Prior radiation? Had radioactive iodine pill for her thyroid in 2014  Pacemaker/ICD? no  Possible current pregnancy?no  Is the patient on methotrexate? no  Current Complaints / other details:  Patient is concerned about an area of swelling that appeared yesterday under her left arm.  BP 96/68 mmHg  Pulse 94  Temp(Src) 97.6 F (36.4 C) (Oral)  Resp 16  Ht 5' (1.524 m)  Wt 93 lb 6.4 oz (42.366 kg)  BMI 18.24 kg/m2

## 2015-10-02 ENCOUNTER — Ambulatory Visit: Payer: Medicare Other | Admitting: Hematology and Oncology

## 2015-10-05 ENCOUNTER — Ambulatory Visit
Admission: RE | Admit: 2015-10-05 | Discharge: 2015-10-05 | Disposition: A | Discharge status: Home / Self Care | Payer: Medicare Other | Source: Ambulatory Visit | Attending: Radiation Oncology | Admitting: Radiation Oncology

## 2015-10-05 ENCOUNTER — Encounter: Payer: Self-pay | Admitting: Radiation Oncology

## 2015-10-05 ENCOUNTER — Encounter: Payer: Self-pay | Admitting: Hematology and Oncology

## 2015-10-05 ENCOUNTER — Ambulatory Visit (HOSPITAL_BASED_OUTPATIENT_CLINIC_OR_DEPARTMENT_OTHER): Payer: Medicare Other | Admitting: Hematology and Oncology

## 2015-10-05 VITALS — BP 101/64 | HR 91 | Temp 98.2°F | Resp 18 | Ht 60.0 in | Wt 91.8 lb

## 2015-10-05 VITALS — BP 96/68 | HR 94 | Temp 97.6°F | Resp 16 | Ht 60.0 in | Wt 93.4 lb

## 2015-10-05 DIAGNOSIS — Z510 Encounter for antineoplastic radiation therapy: Secondary | ICD-10-CM | POA: Diagnosis not present

## 2015-10-05 DIAGNOSIS — C50412 Malignant neoplasm of upper-outer quadrant of left female breast: Secondary | ICD-10-CM | POA: Insufficient documentation

## 2015-10-05 DIAGNOSIS — Z170 Estrogen receptor positive status [ER+]: Secondary | ICD-10-CM | POA: Insufficient documentation

## 2015-10-05 NOTE — Progress Notes (Signed)
Patient Care Team: Kendra Seashore, MD as PCP - General (Internal Medicine) Kendra Harp, MD as Attending Physician (Cardiology) Kendra Banister, MD (Gastroenterology) Kendra Posey, MD as Consulting Physician (Obstetrics and Gynecology) Kendra Cheese, NP as Nurse Practitioner (Hematology and Oncology)  DIAGNOSIS: Breast cancer of upper-outer quadrant of left female breast [name])   Staging form: Breast, AJCC 7th Edition     Clinical stage from 09/13/2015: Stage IA (T1a, N0, M0) - Unsigned   SUMMARY OF ONCOLOGIC HISTORY:   Breast cancer of upper-outer quadrant of left female breast ([name])   09/07/2015 Initial Diagnosis Screening detected left breast mass at 1:00 position 4 x 3 x 4 mm biopsy 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 She is recovered very well from surgery. She has minimal discomfort under the arm.  REVIEW OF SYSTEMS:   Constitutional: Denies fevers, chills or abnormal weight loss Eyes: Denies blurriness of vision Ears, nose, mouth, throat, and face: Denies mucositis or sore throat Respiratory: Denies cough, dyspnea or wheezes Cardiovascular: Denies palpitation, chest discomfort Gastrointestinal:  Denies nausea, heartburn or change in bowel habits Skin: Denies abnormal skin rashes Lymphatics: Denies new lymphadenopathy or easy bruising Neurological:Denies numbness, tingling or new weaknesses Behavioral/Psych: Mood is stable, no new changes  Extremities: No lower extremity  edema Breast: Mild discomfort under the left axilla All other systems were reviewed with the patient and are negative.  I have reviewed the past medical history, past surgical history, social history and family history with the patient and they are unchanged from previous note.  ALLERGIES:  has No Known Allergies.  MEDICATIONS:  Current Outpatient Prescriptions  Medication Sig Dispense Refill  . acetaminophen (TYLENOL) 500 MG tablet Take 500 mg by mouth every 6 (six) hours as needed.    Marland Kitchen amLODipine (NORVASC) 10 MG tablet Take 10 mg by mouth every morning.    Marland Kitchen aspirin 81 MG tablet Take 162 mg by mouth daily.    . calcium citrate (CALCITRATE - DOSED IN MG ELEMENTAL CALCIUM) 950 MG tablet Take 1,200 mg of elemental calcium by mouth daily.    . clopidogrel (PLAVIX) 75 MG tablet Take 1 tablet (75 mg total) by mouth daily. 90 tablet 1  . doxazosin (CARDURA) 8 MG tablet Take 8 mg by mouth every morning.      Marland Kitchen HYDROcodone-acetaminophen (NORCO) 5-325 MG tablet Take 1-2 tablets by mouth every 6 (six) hours as needed for moderate pain or severe pain. (Patient not taking: Reported on 10/05/2015) 30 tablet 0  . levothyroxine (SYNTHROID, LEVOTHROID) 88 MCG tablet Take 88 mcg by mouth daily before breakfast.    . losartan (COZAAR) 100 MG tablet Take 100 mg by mouth every morning.     . Multiple Vitamins-Minerals (MULTIVITAMIN WITH MINERALS) tablet Take 1 tablet by mouth every morning.     . rosuvastatin (CRESTOR) 10 MG tablet Take 10 mg by mouth every morning.      No current facility-administered medications for this visit.    PHYSICAL EXAMINATION: ECOG PERFORMANCE STATUS: 1 -  Symptomatic but completely ambulatory  There were no vitals filed for this visit. There were no vitals filed for this visit.  GENERAL:alert, no distress and comfortable SKIN: skin color, texture, turgor are normal, no rashes or significant lesions EYES: normal, Conjunctiva are pink and non-injected, sclera  clear OROPHARYNX:no exudate, no erythema and lips, buccal mucosa, and tongue normal  NECK: supple, thyroid normal size, non-tender, without nodularity LYMPH:  no palpable lymphadenopathy in the cervical, axillary or inguinal LUNGS: clear to auscultation and percussion with normal breathing effort HEART: regular rate & rhythm and no murmurs and no lower extremity edema ABDOMEN:abdomen soft, non-tender and normal bowel sounds MUSCULOSKELETAL:no cyanosis of digits and no clubbing  NEURO: alert & oriented x 3 with fluent speech, no focal motor/sensory deficits EXTREMITIES: No lower extremity edema   LABORATORY DATA:  I have reviewed the data as listed   Chemistry      Component Value Date/Time   NA 139 09/13/2015 1224   NA 139 08/23/2014 0419   K 4.1 09/13/2015 1224   K 3.4* 08/23/2014 0419   CL 107 08/23/2014 0419   CO2 22 09/13/2015 1224   CO2 23 08/23/2014 0419   BUN 14.8 09/13/2015 1224   BUN 13 08/23/2014 0419   CREATININE 1.0 09/13/2015 1224   CREATININE 0.94 08/23/2014 0419   CREATININE 0.95 08/17/2014 1410      Component Value Date/Time   CALCIUM 9.8 09/13/2015 1224   CALCIUM 8.7 08/23/2014 0419   ALKPHOS 65 09/13/2015 1224   ALKPHOS 86 03/27/2012 1119   AST 19 09/13/2015 1224   AST 22 03/27/2012 1119   ALT 16 09/13/2015 1224   ALT 19 03/27/2012 1119   BILITOT 0.37 09/13/2015 1224   BILITOT 0.2* 03/27/2012 1119       Lab Results  Component Value Date   WBC 7.4 09/13/2015   HGB 14.4 09/13/2015   HCT 43.9 09/13/2015   MCV 96.3 09/13/2015   PLT 212 09/13/2015   NEUTROABS 5.6 09/13/2015     ASSESSMENT & PLAN:  Breast cancer of upper-outer quadrant of left female breast ([place]) Left lumpectomy 09/22/2015: IDC grade 1, 1.1 minutes, ADH, LCIS, margins negative, 0/3 lymph nodes, ER 100%, PR 90%, HER-2 negative, Ki-67 10%, T1 cN0 stage IA  Because the tumor size is more than 0.5 cm, I do recommend Oncotype DX testing to determine if chemotherapy would be of any  benefit.  Recommendation: 1. Oncotype DX testing to determine chemotherapy benefit 2. Followed by adjuvant radiation 3. Followed by adjuvant antiestrogen therapy  Oncotype counseling: I discussed Oncotype DX test. I explained to the patient that this is a 21 gene panel to evaluate patient tumors DNA to calculate recurrence score. This would help determine whether patient has high risk or intermediate risk or low risk breast cancer. She understands that if her tumor was found to be high risk, she would benefit from systemic chemotherapy. If low risk, no need of chemotherapy. If she was found to be intermediate risk, we would need to evaluate the score as well as other risk factors and determine if an abbreviated chemotherapy may be of benefit.  Return to clinic base of Oncotype DX testing   No orders of the defined types were placed in this encounter.   The patient has a good understanding of the overall plan. she agrees with it. she will call with any problems that may develop before the next visit here.   Rulon Eisenmenger, MD 10/05/2015

## 2015-10-05 NOTE — Progress Notes (Signed)
Please see the Nurse Progress Note in the MD Initial Consult Encounter for this patient. 

## 2015-10-05 NOTE — Assessment & Plan Note (Signed)
Left lumpectomy 09/22/2015: IDC grade 1, 1.1 minutes, ADH, LCIS, margins negative, 0/3 lymph nodes, ER 100%, PR 90%, HER-2 negative, Ki-67 10%, T1 cN0 stage IA  Because the tumor size is more than 0.5 cm, I do recommend Oncotype DX testing to determine if chemotherapy would be of any benefit.  Recommendation: 1. Oncotype DX testing to determine chemotherapy benefit 2. Followed by adjuvant radiation 3. Followed by adjuvant antiestrogen therapy  Oncotype counseling: I discussed Oncotype DX test. I explained to the patient that this is a 21 gene panel to evaluate patient tumors DNA to calculate recurrence score. This would help determine whether patient has high risk or intermediate risk or low risk breast cancer. She understands that if her tumor was found to be high risk, she would benefit from systemic chemotherapy. If low risk, no need of chemotherapy. If she was found to be intermediate risk, we would need to evaluate the score as well as other risk factors and determine if an abbreviated chemotherapy may be of benefit.  Return to clinic base of Oncotype DX testing

## 2015-10-05 NOTE — Progress Notes (Addendum)
Radiation Oncology         [phone] ________________________________  Name: Kendra Watts MRN: [id number]  Date: 10/05/2015  DOB: January 03, 1946  Reevaluation Note  CC: Merrilee Seashore, MD  Fanny Skates, MD    ICD-9-CM ICD-10-CM   1. Breast cancer of upper-outer quadrant of left female breast ([place]) 174.4 C50.412     Diagnosis: Low grade invasive ductal carcinoma, ER (100%), PR (90%), Her2-neu negative.   Prior Radiation:  Had radioactive iodine pill for her thyroid in 2014  Narrative:  The patient returns today for reevaluation prior to radiation treatment. I initially met with her on 09/13/2015 in [facility]. The patient proceeded to undergo left breast lumpectomy and sentinel node procedure. She was found to have a 1.1 cm invasive ductal carcinoma spanning over a 1.1 cm. The surgical margins where clear and 3 sentinel lymph nodes showed no evidence of metastatic disease.   She has tenderness under her left arm. She reports that she felt a swollen area yesterday under her left arm after raising it. She states that she noticed a "lump" in the left breast. She denies any lymphedema issues.        Ob Gyn History: She menarched at early age of 38 and went to menopause at age 39. She had no children. She has not received birth control pills. She was never exposed to fertility medications or hormone replacement therapy. She has no family history of Breast/GYN/GI cancer              ALLERGIES:  has No Known Allergies.  Meds: Current Outpatient Prescriptions  Medication Sig Dispense Refill  . acetaminophen (TYLENOL) 500 MG tablet Take 500 mg by mouth every 6 (six) hours as needed.    Marland Kitchen amLODipine (NORVASC) 10 MG tablet Take 10 mg by mouth every morning.    Marland Kitchen aspirin 81 MG tablet Take 162 mg by mouth daily.    . calcium citrate (CALCITRATE - DOSED IN MG ELEMENTAL CALCIUM) 950 MG tablet Take 1,200 mg of elemental calcium by mouth daily.    . clopidogrel (PLAVIX)  75 MG tablet Take 1 tablet (75 mg total) by mouth daily. 90 tablet 1  . doxazosin (CARDURA) 8 MG tablet Take 8 mg by mouth every morning.      Marland Kitchen levothyroxine (SYNTHROID, LEVOTHROID) 88 MCG tablet Take 88 mcg by mouth daily before breakfast.    . losartan (COZAAR) 100 MG tablet Take 100 mg by mouth every morning.     . Multiple Vitamins-Minerals (MULTIVITAMIN WITH MINERALS) tablet Take 1 tablet by mouth every morning.     . rosuvastatin (CRESTOR) 10 MG tablet Take 10 mg by mouth every morning.     Marland Kitchen HYDROcodone-acetaminophen (NORCO) 5-325 MG tablet Take 1-2 tablets by mouth every 6 (six) hours as needed for moderate pain or severe pain. (Patient not taking: Reported on 10/05/2015) 30 tablet 0   No current facility-administered medications for this encounter.    Physical Findings: The patient is in no acute distress. Patient is alert and oriented.  height is 5' (1.524 m) and weight is 93 lb 6.4 oz (42.366 kg). Her oral temperature is 97.6 F (36.4 C). Her blood pressure is 96/68 and her pulse is 94. Her respiration is 16.    No palpable subclavicular adenopathy. The lungs are clear to auscultation. The heart has regular rhythm and rate.    Left breast: She has a well healing scar in the upper-outer quadrant with "surgical glue" in place. She has  a smaller scar in axillary region which is also healing well with surgical glue in place. She may have a small approximately 1.5-2 cm seroma underneath her axillary scar.   Lab Findings: Lab Results  Component Value Date   WBC 7.4 09/13/2015   HGB 14.4 09/13/2015   HCT 43.9 09/13/2015   MCV 96.3 09/13/2015   PLT 212 09/13/2015    Radiographic Findings: Nm Sentinel Node Inj-no Rpt (breast)  09/22/2015  CLINICAL DATA: Cancer left breast Sulfur colloid was injected intradermally by the nuclear medicine technologist for breast cancer sentinel node localization.   Mm Breast Surgical Specimen  09/22/2015  CLINICAL DATA:  70 year old with recent  diagnosis of grade 1 invasive ductal carcinoma of the upper outer quadrant of the left breast. Radioactive seed localization 09/19/2015. Left breast lumpectomy today. EXAM: SPECIMEN RADIOGRAPH OF THE LEFT BREAST COMPARISON:  Previous exam(s). FINDINGS: Status post excision of the left breast. The radioactive seed and ribbon shaped biopsy marker clip are present, completely intact, and are marked for pathology. Results were discussed by telephone with the operating room nurse at the time of interpretation. IMPRESSION: Specimen radiograph of the left breast. Electronically Signed   By: Evangeline Dakin M.D.   On: 09/22/2015 11:14   Mm Digital Diagnostic Unilat L  09/07/2015  CLINICAL DATA:  Status post ultrasound-guided core needle biopsy of left 1 o'clock breast nodule. EXAM: DIAGNOSTIC LEFT MAMMOGRAM POST ULTRASOUND BIOPSY COMPARISON:  Previous exam(s). FINDINGS: Mammographic images were obtained following ultrasound guided biopsy of left breast 1 o'clock nodule. Two-view mammography demonstrates ribbon shaped post biopsy tissue marker which overlaps the nodule on the MLO view and is located 6 mm medially on the craniocaudal view. IMPRESSION: Successful placement of left breast 1 o'clock post biopsy tissue marker, which is located 6 mm medially to the lesion. Final Assessment: Post Procedure Mammograms for Marker Placement Electronically Signed   By: Fidela Salisbury M.D.   On: 09/07/2015 16:03   Mm Lt Radioactive Seed Loc Mammo Guide  09/19/2015  CLINICAL DATA:  70 year old patient recently diagnosed with left breast cancer presents for radioactive seed localization prior to lumpectomy. EXAM: MAMMOGRAPHIC GUIDED RADIOACTIVE SEED LOCALIZATION OF THE LEFT BREAST COMPARISON:  09/07/2015, 08/31/2015, 08/17/2015 FINDINGS: Patient presents for radioactive seed localization prior to lumpectomy. I met with the patient and we discussed the procedure of seed localization including benefits and alternatives. We  discussed the high likelihood of a successful procedure. We discussed the risks of the procedure including infection, bleeding, tissue injury and further surgery. We discussed the low dose of radioactivity involved in the procedure. Informed, written consent was given. The usual time-out protocol was performed immediately prior to the procedure. Using mammographic guidance, sterile technique, 1% lidocaine and an I-125 radioactive seed, a small mass and adjacent ribbon shaped biopsy clip were localized using a superior to inferior approach. The follow-up mammogram images confirm the seed in the expected location and were marked for Dr. [name]. The radioactive seed is adjacent to the lateral margin of the mass. The ribbon shaped biopsy clip is adjacent to the medial margin of the mass. Follow-up survey of the patient confirms presence of the radioactive seed. Order number of I-125 seed:  [id number]. Total activity:  8.413 millicuries  Reference Date: 01 September 2015 The patient tolerated the procedure well and was released from the [facility]. She was given instructions regarding seed removal. IMPRESSION: Radioactive seed localization left breast. No apparent complications. Electronically Signed   By: Curlene Dolphin M.D.   On:  09/19/2015 14:11   [place] Lt Breast Bx W Loc Dev 1st Lesion Img Bx Spec US Guide  09/21/2015  ADDENDUM REPORT: 09/21/2015 07:50 ADDENDUM: Pathology revealed Grade I INVASIVE DUCTAL CARCINOMA of the Left breast at the 1 o'clock location. This was found to be concordant by Dr. Fidela Salisbury. Pathology results were discussed with the patient by telephone. The patient reported tenderness and is doing well after the biopsy. Post biopsy instructions and care were reviewed and questions were answered. The patient was encouraged to call [facility] for any additional concerns. The patient was referred to [facility] on September 08, 2015. Pathology results reported by Terie Purser, RN on 09/08/2015. Electronically Signed   By: Lovey Newcomer M.D.   On: 09/21/2015 07:50  09/21/2015  CLINICAL DATA:  Left breast 1 o'clock irregular nodule. EXAM: ULTRASOUND GUIDED LEFT BREAST CORE NEEDLE BIOPSY COMPARISON:  Previous exam(s). FINDINGS: I met with the patient and we discussed the procedure of ultrasound-guided biopsy, including benefits and alternatives. We discussed the high likelihood of a successful procedure. We discussed the risks of the procedure, including infection, bleeding, tissue injury, clip migration, and inadequate sampling. Informed written consent was given. The usual time-out protocol was performed immediately prior to the procedure. Using sterile technique and 1% Lidocaine as local anesthetic, under direct ultrasound visualization, a 14 gauge spring-loaded device was used to perform biopsy of left breast 1 o'clock nodule using a lateral approach. At the conclusion of the procedure a ribbon shaped tissue marker clip was deployed into the biopsy cavity. Follow up 2 view mammogram was performed and dictated separately. IMPRESSION: Ultrasound guided biopsy of left breast 1 o'clock nodule. No apparent complications. Electronically Signed: By: Fidela Salisbury M.D. On: 09/07/2015 15:44    Impression:  Kendra Watts is a 70 y.o female with low grade invasive ductal carcinoma ER (100%), PR (90%), Her2-neu negative, stage I. The patient would be a good candidate for breast conservation therapy with radiation therapy directed at the left breast.  Plan:  CT simulation is for scheduled for 10/19/2015 at 1 pm. I counseled the patient about the potential risks, benefits, and side effects of curative radiation treatment to the left breast. The patient has signed informed consent and is prepared to proceed with radiation treatment.    [name] has ordered a Oncotype Dx test. If this study shows the  patient is at high risk for systemic recurrence and chemotherapy is recommended, the patient's CT simulation will be canceled. -----------------------------------  Blair Promise, PhD, MD  This document serves as a record of services personally performed by [name], MD. It was created on his behalf by Jenell Milliner, a trained medical scribe. The creation of this record is based on the scribe's personal observations and the provider's statements to them. This document has been checked and approved by the attending provider.

## 2015-10-06 ENCOUNTER — Encounter: Payer: Self-pay | Admitting: *Deleted

## 2015-10-06 NOTE — Progress Notes (Signed)
Ordered oncotype per [name].  Faxed requisition to pathology and confirmed receipt.

## 2015-10-18 ENCOUNTER — Encounter (HOSPITAL_COMMUNITY): Payer: Self-pay

## 2015-10-19 ENCOUNTER — Ambulatory Visit
Admission: RE | Admit: 2015-10-19 | Discharge: 2015-10-19 | Disposition: A | Discharge status: Home / Self Care | Payer: Medicare Other | Source: Ambulatory Visit | Attending: Radiation Oncology | Admitting: Radiation Oncology

## 2015-10-19 ENCOUNTER — Telehealth: Payer: Self-pay | Admitting: Hematology and Oncology

## 2015-10-19 ENCOUNTER — Other Ambulatory Visit: Payer: Self-pay | Admitting: *Deleted

## 2015-10-19 DIAGNOSIS — Z510 Encounter for antineoplastic radiation therapy: Secondary | ICD-10-CM | POA: Diagnosis not present

## 2015-10-19 DIAGNOSIS — C50412 Malignant neoplasm of upper-outer quadrant of left female breast: Secondary | ICD-10-CM | POA: Diagnosis not present

## 2015-10-19 DIAGNOSIS — Z170 Estrogen receptor positive status [ER+]: Secondary | ICD-10-CM | POA: Diagnosis not present

## 2015-10-19 NOTE — Telephone Encounter (Signed)
Spoke with patient to confirm May appt date/time per 3/30 pof

## 2015-10-19 NOTE — Progress Notes (Signed)
  Radiation Oncology         [phone] ________________________________  Name: Kendra Watts MRN: [id number]  Date: 10/19/2015  DOB: Apr 06, 1946  SIMULATION AND TREATMENT PLANNING NOTE    ICD-9-CM ICD-10-CM   1. 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  Stage pT1c, pN0  NARRATIVE:  The patient was brought to the [facility].  Identity was confirmed.  All relevant records and images related to the planned course of therapy were reviewed.  The patient freely provided informed written consent to proceed with treatment after reviewing the details related to the planned course of therapy. The consent form was witnessed and verified by the simulation staff.  Then, the patient was set-up in a stable reproducible  supine position for radiation therapy.  CT images were obtained.  Surface markings were placed.  The CT images were loaded into the planning software.  Then the target and avoidance structures were contoured.  Treatment planning then occurred.  The radiation prescription was entered and confirmed.  Then, I designed and supervised the construction of a total of 3 medically necessary complex treatment devices.  I have requested : 3D Simulation  I have requested a DVH of the following structures: Heart,  Lungs,  lumpectomy cavity.  I have ordered:dose calc.  PLAN:  The patient will receive 42.72 Gy in 16 fractions followed by a boost to the lumpectomy cavity of 10 gray and a cumulative dose to the lumpectomy cavity of 52.72 gray.  -----------------------------------  Blair Promise, PhD, MD

## 2015-10-20 NOTE — Addendum Note (Signed)
Encounter addended by: Jacqulyn Liner, RN on: 10/20/2015  8:41 AM<BR>     Documentation filed: Charges VN

## 2015-10-23 NOTE — Progress Notes (Signed)
  Radiation Oncology         [phone] ________________________________  Name: Kendra Watts MRN: [id number]  Date: 10/19/2015  DOB: 03/09/1946  Optical Surface Tracking Plan:  Since intensity modulated radiotherapy (IMRT) and 3D conformal radiation treatment methods are predicated on accurate and precise positioning for treatment, intrafraction motion monitoring is medically necessary to ensure accurate and safe treatment delivery.  The ability to quantify intrafraction motion without excessive ionizing radiation dose can only be performed with optical surface tracking. Accordingly, surface imaging offers the opportunity to obtain 3D measurements of patient position throughout IMRT and 3D treatments without excessive radiation exposure.  I am ordering optical surface tracking for this patient's upcoming course of radiotherapy. ________________________________  [name], MD 10/23/2015 2:41 PM    Reference:   Particia Jasper, et al. Surface imaging-based analysis of intrafraction motion for breast radiotherapy patients.Journal of [place], n. 6, nov. 2014. ISSN [id number].   Available at: <http://www.jacmp.org/index.php/jacmp/article/view/4957>.

## 2015-10-23 NOTE — Addendum Note (Signed)
Encounter addended by: [name], MD on: 10/23/2015  2:41 PM<BR>     Documentation filed: Notes Section

## 2015-10-24 DIAGNOSIS — I10 Essential (primary) hypertension: Secondary | ICD-10-CM | POA: Diagnosis not present

## 2015-10-24 DIAGNOSIS — E782 Mixed hyperlipidemia: Secondary | ICD-10-CM | POA: Diagnosis not present

## 2015-10-24 DIAGNOSIS — Z170 Estrogen receptor positive status [ER+]: Secondary | ICD-10-CM | POA: Diagnosis not present

## 2015-10-24 DIAGNOSIS — Z510 Encounter for antineoplastic radiation therapy: Secondary | ICD-10-CM | POA: Diagnosis not present

## 2015-10-24 DIAGNOSIS — C50412 Malignant neoplasm of upper-outer quadrant of left female breast: Secondary | ICD-10-CM | POA: Diagnosis not present

## 2015-10-24 DIAGNOSIS — N183 Chronic kidney disease, stage 3 (moderate): Secondary | ICD-10-CM | POA: Diagnosis not present

## 2015-10-25 DIAGNOSIS — M810 Age-related osteoporosis without current pathological fracture: Secondary | ICD-10-CM | POA: Diagnosis not present

## 2015-10-26 ENCOUNTER — Ambulatory Visit
Admission: RE | Admit: 2015-10-26 | Discharge: 2015-10-26 | Disposition: A | Discharge status: Home / Self Care | Payer: Medicare Other | Source: Ambulatory Visit | Attending: Radiation Oncology | Admitting: Radiation Oncology

## 2015-10-26 DIAGNOSIS — Z170 Estrogen receptor positive status [ER+]: Secondary | ICD-10-CM | POA: Diagnosis not present

## 2015-10-26 DIAGNOSIS — C50412 Malignant neoplasm of upper-outer quadrant of left female breast: Secondary | ICD-10-CM

## 2015-10-26 DIAGNOSIS — Z510 Encounter for antineoplastic radiation therapy: Secondary | ICD-10-CM | POA: Diagnosis not present

## 2015-10-26 NOTE — Progress Notes (Signed)
  Radiation Oncology         [phone] ________________________________  Name: Kendra Watts MRN: [id number]  Date: 10/26/2015  DOB: 1945-11-08  Simulation Verification Note    ICD-9-CM ICD-10-CM   1. Breast cancer of upper-outer quadrant of left female breast ([place]) 174.4 C50.412     Status: outpatient  NARRATIVE: The patient was brought to the treatment unit and placed in the planned treatment position. The clinical setup was verified. Then port films were obtained and uploaded to the radiation oncology medical record software.  The treatment beams were carefully compared against the planned radiation fields. The position location and shape of the radiation fields was reviewed. They targeted volume of tissue appears to be appropriately covered by the radiation beams. Organs at risk appear to be excluded as planned.  Based on my personal review, I approved the simulation verification. The patient's treatment will proceed as planned.  -----------------------------------  Blair Promise, PhD, MD

## 2015-10-30 ENCOUNTER — Ambulatory Visit
Admission: RE | Admit: 2015-10-30 | Discharge: 2015-10-30 | Disposition: A | Discharge status: Home / Self Care | Payer: Medicare Other | Source: Ambulatory Visit | Attending: Radiation Oncology | Admitting: Radiation Oncology

## 2015-10-30 DIAGNOSIS — Z510 Encounter for antineoplastic radiation therapy: Secondary | ICD-10-CM | POA: Diagnosis not present

## 2015-10-30 DIAGNOSIS — Z170 Estrogen receptor positive status [ER+]: Secondary | ICD-10-CM | POA: Diagnosis not present

## 2015-10-30 DIAGNOSIS — C50412 Malignant neoplasm of upper-outer quadrant of left female breast: Secondary | ICD-10-CM | POA: Diagnosis not present

## 2015-10-31 ENCOUNTER — Ambulatory Visit
Admission: RE | Admit: 2015-10-31 | Discharge: 2015-10-31 | Disposition: A | Discharge status: Home / Self Care | Payer: Medicare Other | Source: Ambulatory Visit | Attending: Radiation Oncology | Admitting: Radiation Oncology

## 2015-10-31 ENCOUNTER — Encounter: Payer: Self-pay | Admitting: Radiation Oncology

## 2015-10-31 VITALS — BP 108/66 | HR 89 | Temp 98.6°F | Ht 60.0 in | Wt 91.7 lb

## 2015-10-31 DIAGNOSIS — C50412 Malignant neoplasm of upper-outer quadrant of left female breast: Secondary | ICD-10-CM | POA: Diagnosis not present

## 2015-10-31 DIAGNOSIS — Z510 Encounter for antineoplastic radiation therapy: Secondary | ICD-10-CM | POA: Insufficient documentation

## 2015-10-31 DIAGNOSIS — I739 Peripheral vascular disease, unspecified: Secondary | ICD-10-CM | POA: Diagnosis not present

## 2015-10-31 DIAGNOSIS — N183 Chronic kidney disease, stage 3 (moderate): Secondary | ICD-10-CM | POA: Diagnosis not present

## 2015-10-31 DIAGNOSIS — E782 Mixed hyperlipidemia: Secondary | ICD-10-CM | POA: Diagnosis not present

## 2015-10-31 DIAGNOSIS — I10 Essential (primary) hypertension: Secondary | ICD-10-CM | POA: Diagnosis not present

## 2015-10-31 DIAGNOSIS — Z170 Estrogen receptor positive status [ER+]: Secondary | ICD-10-CM | POA: Diagnosis not present

## 2015-10-31 MED ORDER — RADIAPLEXRX EX GEL
Freq: Once | CUTANEOUS | Status: AC
  Administered 2015-10-31: 17:00:00 via TOPICAL

## 2015-10-31 MED ORDER — ALRA NON-METALLIC DEODORANT (RAD-ONC)
1.0000 "application " | Freq: Once | TOPICAL | Status: AC
  Administered 2015-10-31: 1 via TOPICAL

## 2015-10-31 NOTE — Progress Notes (Signed)
Kendra Watts has completed 2 fractions to her left breast.  She does not have any complaints today.  The skin on her left breast is intact.  BP 108/66 mmHg  Pulse 89  Temp(Src) 98.6 F (37 C) (Oral)  Ht 5' (1.524 m)  Wt 91 lb 11.2 oz (41.595 kg)  BMI 17.91 kg/m2

## 2015-10-31 NOTE — Progress Notes (Signed)
  Radiation Oncology         [phone] ________________________________  Name: SHELAH HEATLEY MRN: [id number]  Date: 10/31/2015  DOB: 1946-05-18  Weekly Radiation Therapy Management    ICD-9-CM ICD-10-CM   1. Breast cancer of upper-outer quadrant of left female breast (HCC) 174.4 C50.412      Current Dose: 5.34 Gy     Planned Dose:  52.72 Gy  Narrative . . . . . . . . The patient presents for routine under treatment assessment.                                   The patient is without complaint.                                 Set-up films were reviewed.                                 The chart was checked. Physical Findings. . .  height is 5' (1.524 m) and weight is 91 lb 11.2 oz (41.595 kg). Her oral temperature is 98.6 F (37 C). Her blood pressure is 108/66 and her pulse is 89. . The lungs are clear. The heart has a regular rhythm and rate. The left breast area shows no signs of radiation reaction. The patient's lumpectomy scar is well-healed. Impression . . . . . . . The patient is tolerating radiation. Plan . . . . . . . . . . . . Continue treatment as planned.  ________________________________   Blair Promise, PhD, MD

## 2015-10-31 NOTE — Progress Notes (Signed)
Pt here for patient teaching.  Pt given Radiation and You booklet, skin care instructions, Alra deodorant and Radiaplex gel. Pt reports they have watched the Radiation Therapy Education video.  Reviewed areas of pertinence such as fatigue, skin changes, breast tenderness and breast swelling . Pt able to give teach back of to pat skin and use unscented/gentle soap,apply Radiaplex bid and avoid applying anything to skin within 4 hours of treatment. Pt demonstrated understanding and verbalizes understanding of information given and will contact nursing with any questions or concerns.     Http://rtanswers.org/treatmentinformation/whattoexpect/index

## 2015-11-01 ENCOUNTER — Ambulatory Visit
Admission: RE | Admit: 2015-11-01 | Discharge: 2015-11-01 | Disposition: A | Discharge status: Home / Self Care | Payer: Medicare Other | Source: Ambulatory Visit | Attending: Radiation Oncology | Admitting: Radiation Oncology

## 2015-11-01 DIAGNOSIS — C50412 Malignant neoplasm of upper-outer quadrant of left female breast: Secondary | ICD-10-CM | POA: Diagnosis not present

## 2015-11-01 DIAGNOSIS — Z170 Estrogen receptor positive status [ER+]: Secondary | ICD-10-CM | POA: Diagnosis not present

## 2015-11-01 DIAGNOSIS — Z510 Encounter for antineoplastic radiation therapy: Secondary | ICD-10-CM | POA: Diagnosis not present

## 2015-11-02 ENCOUNTER — Ambulatory Visit
Admission: RE | Admit: 2015-11-02 | Discharge: 2015-11-02 | Disposition: A | Discharge status: Home / Self Care | Payer: Medicare Other | Source: Ambulatory Visit | Attending: Radiation Oncology | Admitting: Radiation Oncology

## 2015-11-02 DIAGNOSIS — Z170 Estrogen receptor positive status [ER+]: Secondary | ICD-10-CM | POA: Diagnosis not present

## 2015-11-02 DIAGNOSIS — C50412 Malignant neoplasm of upper-outer quadrant of left female breast: Secondary | ICD-10-CM | POA: Diagnosis not present

## 2015-11-02 DIAGNOSIS — Z510 Encounter for antineoplastic radiation therapy: Secondary | ICD-10-CM | POA: Diagnosis not present

## 2015-11-03 ENCOUNTER — Ambulatory Visit
Admission: RE | Admit: 2015-11-03 | Discharge: 2015-11-03 | Disposition: A | Discharge status: Home / Self Care | Payer: Medicare Other | Source: Ambulatory Visit | Attending: Radiation Oncology | Admitting: Radiation Oncology

## 2015-11-03 DIAGNOSIS — Z170 Estrogen receptor positive status [ER+]: Secondary | ICD-10-CM | POA: Diagnosis not present

## 2015-11-03 DIAGNOSIS — Z510 Encounter for antineoplastic radiation therapy: Secondary | ICD-10-CM | POA: Diagnosis not present

## 2015-11-03 DIAGNOSIS — C50412 Malignant neoplasm of upper-outer quadrant of left female breast: Secondary | ICD-10-CM | POA: Diagnosis not present

## 2015-11-06 ENCOUNTER — Ambulatory Visit
Admission: RE | Admit: 2015-11-06 | Discharge: 2015-11-06 | Disposition: A | Discharge status: Home / Self Care | Payer: Medicare Other | Source: Ambulatory Visit | Attending: Radiation Oncology | Admitting: Radiation Oncology

## 2015-11-06 DIAGNOSIS — Z510 Encounter for antineoplastic radiation therapy: Secondary | ICD-10-CM | POA: Diagnosis not present

## 2015-11-06 DIAGNOSIS — Z170 Estrogen receptor positive status [ER+]: Secondary | ICD-10-CM | POA: Diagnosis not present

## 2015-11-06 DIAGNOSIS — C50412 Malignant neoplasm of upper-outer quadrant of left female breast: Secondary | ICD-10-CM | POA: Diagnosis not present

## 2015-11-07 ENCOUNTER — Ambulatory Visit
Admission: RE | Admit: 2015-11-07 | Discharge: 2015-11-07 | Disposition: A | Discharge status: Home / Self Care | Payer: Medicare Other | Source: Ambulatory Visit | Attending: Radiation Oncology | Admitting: Radiation Oncology

## 2015-11-07 ENCOUNTER — Encounter: Payer: Self-pay | Admitting: Radiation Oncology

## 2015-11-07 VITALS — BP 96/66 | HR 89 | Temp 98.8°F | Ht 60.0 in | Wt 91.2 lb

## 2015-11-07 DIAGNOSIS — Z170 Estrogen receptor positive status [ER+]: Secondary | ICD-10-CM | POA: Diagnosis not present

## 2015-11-07 DIAGNOSIS — C50412 Malignant neoplasm of upper-outer quadrant of left female breast: Secondary | ICD-10-CM | POA: Diagnosis not present

## 2015-11-07 DIAGNOSIS — Z510 Encounter for antineoplastic radiation therapy: Secondary | ICD-10-CM | POA: Diagnosis not present

## 2015-11-07 NOTE — Progress Notes (Signed)
  Radiation Oncology         [phone] ________________________________  Name: Kendra Watts MRN: [id number]  Date: 11/07/2015  DOB: October 23, 1945  Weekly Radiation Therapy Management    ICD-9-CM ICD-10-CM   1. Breast cancer of upper-outer quadrant of left female breast (HCC) 174.4 C50.412      Current Dose: 18.69 Gy     Planned Dose:  52.72 Gy  Narrative . . . . . . . . The patient presents for routine under treatment assessment.                                  Kendra Watts has completed 7 fractions to her left breast.  She reports occasional sharp pains in her left breast.  She reports feeling a little fatigued.  The skin on her left breast is pink.  She is using radiaplex.                                 Set-up films were reviewed.                                 The chart was checked. Physical Findings. . .  height is 5' (1.524 m) and weight is 91 lb 3.2 oz (41.368 kg). Her oral temperature is 98.8 F (37.1 C). Her blood pressure is 96/66 and her pulse is 89. . The lungs are clear. The heart has a regular rhythm and rate. The left breast area shows slight erythema. Impression . . . . . . . The patient is tolerating radiation. Plan . . . . . . . . . . . . Continue treatment as planned.  ________________________________   Blair Promise, PhD, MD  This document serves as a record of services personally performed by [name], MD. It was created on his behalf by Darcus Austin, a trained medical scribe. The creation of this record is based on the scribe's personal observations and the provider's statements to them. This document has been checked and approved by the attending provider.

## 2015-11-07 NOTE — Progress Notes (Signed)
Kendra Watts has completed 7 fractions to her left breast.  She denies having pain except for occasional sharp pains in her left breast.  She reports feeling fatigue.  The skin on her left breast is pink.  She is using radiaplex.  BP 96/66 mmHg  Pulse 89  Temp(Src) 98.8 F (37.1 C) (Oral)  Ht 5' (1.524 m)  Wt 91 lb 3.2 oz (41.368 kg)  BMI 17.81 kg/m2   Wt Readings from Last 3 Encounters:  11/07/15 91 lb 3.2 oz (41.368 kg)  10/31/15 91 lb 11.2 oz (41.595 kg)  10/05/15 91 lb 12.8 oz (41.64 kg)

## 2015-11-08 ENCOUNTER — Ambulatory Visit
Admission: RE | Admit: 2015-11-08 | Discharge: 2015-11-08 | Disposition: A | Discharge status: Home / Self Care | Payer: Medicare Other | Source: Ambulatory Visit | Attending: Radiation Oncology | Admitting: Radiation Oncology

## 2015-11-08 DIAGNOSIS — Z170 Estrogen receptor positive status [ER+]: Secondary | ICD-10-CM | POA: Diagnosis not present

## 2015-11-08 DIAGNOSIS — C50412 Malignant neoplasm of upper-outer quadrant of left female breast: Secondary | ICD-10-CM | POA: Diagnosis not present

## 2015-11-08 DIAGNOSIS — Z510 Encounter for antineoplastic radiation therapy: Secondary | ICD-10-CM | POA: Diagnosis not present

## 2015-11-09 ENCOUNTER — Ambulatory Visit
Admission: RE | Admit: 2015-11-09 | Discharge: 2015-11-09 | Disposition: A | Discharge status: Home / Self Care | Payer: Medicare Other | Source: Ambulatory Visit | Attending: Radiation Oncology | Admitting: Radiation Oncology

## 2015-11-09 ENCOUNTER — Telehealth: Payer: Self-pay | Admitting: *Deleted

## 2015-11-09 DIAGNOSIS — Z170 Estrogen receptor positive status [ER+]: Secondary | ICD-10-CM | POA: Diagnosis not present

## 2015-11-09 DIAGNOSIS — C50412 Malignant neoplasm of upper-outer quadrant of left female breast: Secondary | ICD-10-CM | POA: Diagnosis not present

## 2015-11-09 DIAGNOSIS — Z510 Encounter for antineoplastic radiation therapy: Secondary | ICD-10-CM | POA: Diagnosis not present

## 2015-11-09 NOTE — Telephone Encounter (Signed)
Spoke with patient to follow up after starting radiation.  She states she is doing well.  Encouraged her to call with any needs or concerns.

## 2015-11-10 ENCOUNTER — Ambulatory Visit
Admission: RE | Admit: 2015-11-10 | Discharge: 2015-11-10 | Disposition: A | Discharge status: Home / Self Care | Payer: Medicare Other | Source: Ambulatory Visit | Attending: Radiation Oncology | Admitting: Radiation Oncology

## 2015-11-10 DIAGNOSIS — Z170 Estrogen receptor positive status [ER+]: Secondary | ICD-10-CM | POA: Diagnosis not present

## 2015-11-10 DIAGNOSIS — C50412 Malignant neoplasm of upper-outer quadrant of left female breast: Secondary | ICD-10-CM | POA: Diagnosis not present

## 2015-11-10 DIAGNOSIS — Z510 Encounter for antineoplastic radiation therapy: Secondary | ICD-10-CM | POA: Diagnosis not present

## 2015-11-13 ENCOUNTER — Ambulatory Visit
Admission: RE | Admit: 2015-11-13 | Discharge: 2015-11-13 | Disposition: A | Discharge status: Home / Self Care | Payer: Medicare Other | Source: Ambulatory Visit | Attending: Radiation Oncology | Admitting: Radiation Oncology

## 2015-11-13 DIAGNOSIS — Z170 Estrogen receptor positive status [ER+]: Secondary | ICD-10-CM | POA: Diagnosis not present

## 2015-11-13 DIAGNOSIS — C50412 Malignant neoplasm of upper-outer quadrant of left female breast: Secondary | ICD-10-CM | POA: Diagnosis not present

## 2015-11-13 DIAGNOSIS — Z510 Encounter for antineoplastic radiation therapy: Secondary | ICD-10-CM | POA: Diagnosis not present

## 2015-11-14 ENCOUNTER — Encounter: Payer: Self-pay | Admitting: Radiation Oncology

## 2015-11-14 ENCOUNTER — Ambulatory Visit
Admission: RE | Admit: 2015-11-14 | Discharge: 2015-11-14 | Disposition: A | Discharge status: Home / Self Care | Payer: Medicare Other | Source: Ambulatory Visit | Attending: Radiation Oncology | Admitting: Radiation Oncology

## 2015-11-14 ENCOUNTER — Ambulatory Visit: Payer: Medicare Other | Admitting: Radiation Oncology

## 2015-11-14 VITALS — BP 124/70 | HR 85 | Resp 16 | Wt 92.5 lb

## 2015-11-14 DIAGNOSIS — C50412 Malignant neoplasm of upper-outer quadrant of left female breast: Secondary | ICD-10-CM | POA: Diagnosis not present

## 2015-11-14 DIAGNOSIS — Z510 Encounter for antineoplastic radiation therapy: Secondary | ICD-10-CM | POA: Diagnosis not present

## 2015-11-14 DIAGNOSIS — Z170 Estrogen receptor positive status [ER+]: Secondary | ICD-10-CM | POA: Diagnosis not present

## 2015-11-14 NOTE — Progress Notes (Signed)
  Radiation Oncology         [phone] ________________________________  Name: Kendra Watts MRN: [id number]  Date: 11/14/2015  DOB: [birth year]  Weekly Radiation Therapy Management    ICD-9-CM ICD-10-CM   1. Breast cancer of upper-outer quadrant of left female breast (HCC) 174.4 C50.412      Current Dose: 32.04 Gy     Planned Dose:  52.72 Gy  Narrative . . . . . . . . The patient presents for routine under treatment assessment.                                  Denies pain. Reports an occasional brief twinge in her left breast, but understands this is related to nerves healing following surgery. No evidence of lymphedema noted by the nurse. Reports using radiaplex bid as directed. Reports mild fatigue.                                 Set-up films were reviewed.                                 The chart was checked. Physical Findings. . .  weight is 92 lb 8 oz (41.958 kg). Her blood pressure is 124/70 and her pulse is 85. Her respiration is 16 and oxygen saturation is 100%. . The lungs are clear. The heart has a regular rhythm and rate. The left breast area shows slight erythema. Impression . . . . . . . The patient is tolerating radiation. Plan . . . . . . . . . . . . Continue treatment as planned.  ________________________________   Blair Promise, PhD, MD  This document serves as a record of services personally performed by [name], MD. It was created on his behalf by Darcus Austin, a trained medical scribe. The creation of this record is based on the scribe's personal observations and the provider's statements to them. This document has been checked and approved by the attending provider.

## 2015-11-14 NOTE — Progress Notes (Signed)
Weight and vitals stable. Denies pain. Reports an occasional brief twinge in her left breast but, understands this is related to nerves healing following surgery. No evidence of lymphedema noted. Hyperpigmentation without desquamation of left/treated breast noted. Reports using radiaplex bid as directed. Reports mild fatigue.   BP 124/70 mmHg  Pulse 85  Resp 16  Wt 92 lb 8 oz (41.958 kg)  SpO2 100% Wt Readings from Last 3 Encounters:  11/14/15 92 lb 8 oz (41.958 kg)  11/07/15 91 lb 3.2 oz (41.368 kg)  10/31/15 91 lb 11.2 oz (41.595 kg)

## 2015-11-15 ENCOUNTER — Ambulatory Visit
Admission: RE | Admit: 2015-11-15 | Discharge: 2015-11-15 | Disposition: A | Discharge status: Home / Self Care | Payer: Medicare Other | Source: Ambulatory Visit | Attending: Radiation Oncology | Admitting: Radiation Oncology

## 2015-11-15 DIAGNOSIS — Z170 Estrogen receptor positive status [ER+]: Secondary | ICD-10-CM | POA: Diagnosis not present

## 2015-11-15 DIAGNOSIS — C50412 Malignant neoplasm of upper-outer quadrant of left female breast: Secondary | ICD-10-CM | POA: Diagnosis not present

## 2015-11-15 DIAGNOSIS — Z510 Encounter for antineoplastic radiation therapy: Secondary | ICD-10-CM | POA: Diagnosis not present

## 2015-11-16 ENCOUNTER — Ambulatory Visit: Payer: Medicare Other | Admitting: Radiation Oncology

## 2015-11-16 ENCOUNTER — Ambulatory Visit
Admission: RE | Admit: 2015-11-16 | Discharge: 2015-11-16 | Disposition: A | Discharge status: Home / Self Care | Payer: Medicare Other | Source: Ambulatory Visit | Attending: Radiation Oncology | Admitting: Radiation Oncology

## 2015-11-16 DIAGNOSIS — Z510 Encounter for antineoplastic radiation therapy: Secondary | ICD-10-CM | POA: Diagnosis not present

## 2015-11-16 DIAGNOSIS — Z170 Estrogen receptor positive status [ER+]: Secondary | ICD-10-CM | POA: Diagnosis not present

## 2015-11-16 DIAGNOSIS — C50412 Malignant neoplasm of upper-outer quadrant of left female breast: Secondary | ICD-10-CM | POA: Diagnosis not present

## 2015-11-17 ENCOUNTER — Ambulatory Visit: Admission: RE | Admit: 2015-11-17 | Payer: Medicare Other | Source: Ambulatory Visit

## 2015-11-17 ENCOUNTER — Ambulatory Visit
Admission: RE | Admit: 2015-11-17 | Discharge: 2015-11-17 | Disposition: A | Discharge status: Home / Self Care | Payer: Medicare Other | Source: Ambulatory Visit | Attending: Radiation Oncology | Admitting: Radiation Oncology

## 2015-11-17 ENCOUNTER — Ambulatory Visit (INDEPENDENT_AMBULATORY_CARE_PROVIDER_SITE_OTHER): Payer: Medicare Other | Admitting: Cardiovascular Disease

## 2015-11-17 ENCOUNTER — Encounter: Payer: Self-pay | Admitting: Cardiovascular Disease

## 2015-11-17 VITALS — BP 90/64 | HR 98 | Ht 60.0 in | Wt 91.0 lb

## 2015-11-17 DIAGNOSIS — E785 Hyperlipidemia, unspecified: Secondary | ICD-10-CM

## 2015-11-17 DIAGNOSIS — I739 Peripheral vascular disease, unspecified: Secondary | ICD-10-CM | POA: Diagnosis not present

## 2015-11-17 DIAGNOSIS — Z720 Tobacco use: Secondary | ICD-10-CM

## 2015-11-17 DIAGNOSIS — I701 Atherosclerosis of renal artery: Secondary | ICD-10-CM

## 2015-11-17 DIAGNOSIS — I10 Essential (primary) hypertension: Secondary | ICD-10-CM

## 2015-11-17 NOTE — Assessment & Plan Note (Addendum)
History of continued tobacco abuse of one pack per day recalcitrant to risk factor modification.Marland Kitchen

## 2015-11-17 NOTE — Patient Instructions (Signed)
Medication Instructions:  Your physician recommends that you continue on your current medications as directed. Please refer to the Current Medication list given to you today.   Labwork: I will get your lab work from your Primary Care Physician.   Testing/Procedures: Your physician has requested that you have a lower extremity arterial doppler- During this test, ultrasound is used to evaluate arterial blood flow in the legs. Allow approximately one hour for this exam.    Follow-Up: Your physician wants you to follow-up in: 12 months with Dr. Gwenlyn Found. You will receive a reminder letter in the mail two months in advance. If you don't receive a letter, please call our office to schedule the follow-up appointment.   Any Other Special Instructions Will Be Listed Below (If Applicable).     If you need a refill on your cardiac medications before your next appointment, please call your pharmacy.

## 2015-11-17 NOTE — Assessment & Plan Note (Signed)
History of carotid artery disease status post elective right carotid endarterectomy performed by Dr. Trula Slade on 04/02/12. Carotid Dopplers performed 04/27/15 revealed moderate right and mild left ICA stenosis. We'll continue to follow this on a noninvasive basis

## 2015-11-17 NOTE — Assessment & Plan Note (Signed)
History of hyperlipidemia on Crestor followed by her PCP 

## 2015-11-17 NOTE — Progress Notes (Signed)
11/17/2015 Kendra Watts   1946-07-14  [id number]  Primary Physician Kendra Seashore, MD Primary Cardiologist: Kendra Harp MD Kendra Watts   HPI:  The patient is a delightful 70 year old thin-appearing divorced Caucasian female with no children who I last saw on 12/28/14... She is retired from doing [employer] at [employer]. She has a history of continued tobacco abuse at 1 pack per day, hypertension and hyperlipidemia with a strong family history for heart disease. Her mother had her first MI at age 52 and died at age 13. I have angiogrammed her revealing a tight right internal carotid artery stenosis, as well as a right subclavian artery stenosis. 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 I stented her left renal artery and she has moderate bilateral iliac disease but really denies claudication.She did had elective right carotid endarterectomy performed by Dr. Trula Watts April 02, 2012.since I saw her a year ago she's been remarkably stable. Carotid Dopplers performed /23/15 revealed a widely patent right carotid endarterectomy site and minimal left carotid disease. Renal Dopplers social progression of disease in the right renal artery. Her left kidney continues to diminish in size despite a patent stent. Recent renal Dopplers performed in April show progression of disease on the right as well.Lower extremity arterial Dopplers to show progression of disease in the iliac arteries as she denies claudication.she denies chest pain or shortness of breath. 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 Follow-up renal Doppler studies performed on 05/19/14 revealed progression of disease in her right renal artery with a renal aortic ratio that went from 6.34-7.96. I'm concerned that she has progression of disease in her only functioning kidney. I performed angiography  on her 07/04/14 via the left brachial approach revealing occluded left renal artery stent and 90% calcified ostial downgoing right renal artery. I decided not to intervene at that time. I'm slightly concerned about the technical difficulties with an ostial calcified lesion with regard to dissection and/or rupture. Her only other option would be renal bypass surgery. I performed percutaneous revascularization on her right renal artery ostia on 08/22/14 via the left brachial approach reducing a 95% calcified ostial right renal artery stenosis and a solitary kidney to 0% residual with an ICast covered Stent. Her most recent renal Doppler study performed 06/07/15 revealed this to be widely patent. Since I saw her a year ago she's remained asymptomatic. She denies chest pain, shortness of breath. She does have some pain in her legs which sound like restless leg syndrome. She had ductal breast carcinoma with recent lumpectomy performed by Dr. Dalbert Watts last month and radiation therapy.  Current Outpatient Prescriptions  Medication Sig Dispense Refill  . acetaminophen (TYLENOL) 500 MG tablet Take 500 mg by mouth every 6 (six) hours as needed. Reported on 10/31/2015    . amLODipine (NORVASC) 10 MG tablet Take 10 mg by mouth every morning.    Marland Kitchen aspirin 81 MG tablet Take 162 mg by mouth daily.    . calcium citrate (CALCITRATE - DOSED IN MG ELEMENTAL CALCIUM) 950 MG tablet Take 1,200 mg of elemental calcium by mouth daily.    . clopidogrel (PLAVIX) 75 MG tablet Take 1 tablet (75 mg total) by mouth daily. 90 tablet 1  . doxazosin (CARDURA) 8 MG tablet Take 8 mg by mouth every morning.      . hyaluronate sodium (RADIAPLEXRX) GEL Apply 1  application topically once.    Marland Kitchen levothyroxine (SYNTHROID, LEVOTHROID) 88 MCG tablet Take 88 mcg by mouth daily before breakfast.    . losartan (COZAAR) 100 MG tablet Take 100 mg by mouth every morning.     . Multiple Vitamins-Minerals (MULTIVITAMIN WITH MINERALS) tablet Take 1 tablet by  mouth every morning.     . non-metallic deodorant Jethro Poling) MISC Apply 1 application topically daily as needed.    . rosuvastatin (CRESTOR) 10 MG tablet Take 10 mg by mouth every morning.      No current facility-administered medications for this visit.    No Known Allergies  Social History   Social History  . Marital Status: Divorced    Spouse Name: N/A  . Number of Children: 0  . Years of Education: N/A   Occupational History  . HR administrator    Social History Main Topics  . Smoking status: Current Every Day Smoker -- 1.50 packs/day for 40 years    Types: Cigarettes  . Smokeless tobacco: Never Used  . Alcohol Use: Yes     Comment: occasionally  . Drug Use: No  . Sexual Activity: No   Other Topics Concern  . Not on file   Social History Narrative     Review of Systems: General: negative for chills, fever, night sweats or weight changes.  Cardiovascular: negative for chest pain, dyspnea on exertion, edema, orthopnea, palpitations, paroxysmal nocturnal dyspnea or shortness of breath Dermatological: negative for rash Respiratory: negative for cough or wheezing Urologic: negative for hematuria Abdominal: negative for nausea, vomiting, diarrhea, bright red blood per rectum, melena, or hematemesis Neurologic: negative for visual changes, syncope, or dizziness All other systems reviewed and are otherwise negative except as noted above.    Blood pressure 90/64, pulse 98, height 5' (1.524 m), weight 91 lb (41.277 kg).  General appearance: alert and no distress Neck: no adenopathy, no JVD, supple, symmetrical, trachea midline, thyroid not enlarged, symmetric, no tenderness/mass/nodules and bilateral carotid bruits left louder than right Lungs: clear to auscultation bilaterally Heart: regular rate and rhythm, S1, S2 normal, no murmur, click, rub or gallop Extremities: extremities normal, atraumatic, no cyanosis or edema  EKG not performed today  ASSESSMENT AND PLAN:    Occlusion and stenosis of carotid artery without mention of cerebral infarction History of carotid artery disease status post elective right carotid endarterectomy performed by Dr. Trula Watts on 04/02/12. Carotid Dopplers performed 04/27/15 revealed moderate right and mild left ICA stenosis. We'll continue to follow this on a noninvasive basis  Atherosclerotic renal artery stenosis, bilateral History of renal artery stenosis status post remote left renal artery stenting with angiographic documentation of occlusion. She had high-grade calcified ostial left renal artery stenosis which I ultimately fixed percutaneously. A left brachial approach because of downward takeoff 08/22/14 using an ICast t covered stent. Her most recent renal Dopplers performed 06/07/15 revealed the stent to be widely patent.  Essential hypertension History of hypertension blood pressure measured at 90/64. She is on amlodipine and losartan. Continue current meds at current dosing  Hyperlipidemia History of hyperlipidemia on Crestor followed by her PCP  Tobacco abuse History of continued tobacco abuse of one pack per day recalcitrant to risk factor modification.Kendra Harp MD FACP,FACC,FAHA, [facility] 11/17/2015 12:26 PM

## 2015-11-17 NOTE — Assessment & Plan Note (Signed)
History of hypertension blood pressure measured at 90/64. She is on amlodipine and losartan. Continue current meds at current dosing

## 2015-11-17 NOTE — Assessment & Plan Note (Signed)
History of renal artery stenosis status post remote left renal artery stenting with angiographic documentation of occlusion. She had high-grade calcified ostial left renal artery stenosis which I ultimately fixed percutaneously. A left brachial approach because of downward takeoff 08/22/14 using an ICast t covered stent. Her most recent renal Dopplers performed 06/07/15 revealed the stent to be widely patent.

## 2015-11-20 ENCOUNTER — Ambulatory Visit
Admission: RE | Admit: 2015-11-20 | Discharge: 2015-11-20 | Disposition: A | Discharge status: Home / Self Care | Payer: Medicare Other | Source: Ambulatory Visit | Attending: Radiation Oncology | Admitting: Radiation Oncology

## 2015-11-20 DIAGNOSIS — Z170 Estrogen receptor positive status [ER+]: Secondary | ICD-10-CM | POA: Diagnosis not present

## 2015-11-20 DIAGNOSIS — C50412 Malignant neoplasm of upper-outer quadrant of left female breast: Secondary | ICD-10-CM | POA: Diagnosis not present

## 2015-11-20 DIAGNOSIS — Z510 Encounter for antineoplastic radiation therapy: Secondary | ICD-10-CM | POA: Diagnosis not present

## 2015-11-21 ENCOUNTER — Ambulatory Visit
Admission: RE | Admit: 2015-11-21 | Discharge: 2015-11-21 | Disposition: A | Discharge status: Home / Self Care | Payer: Medicare Other | Source: Ambulatory Visit | Attending: Radiation Oncology | Admitting: Radiation Oncology

## 2015-11-21 ENCOUNTER — Encounter: Payer: Self-pay | Admitting: Radiation Oncology

## 2015-11-21 ENCOUNTER — Ambulatory Visit: Payer: Medicare Other

## 2015-11-21 VITALS — BP 97/68 | HR 81 | Temp 98.4°F | Ht 60.0 in | Wt 91.0 lb

## 2015-11-21 DIAGNOSIS — Z510 Encounter for antineoplastic radiation therapy: Secondary | ICD-10-CM | POA: Diagnosis not present

## 2015-11-21 DIAGNOSIS — C50412 Malignant neoplasm of upper-outer quadrant of left female breast: Secondary | ICD-10-CM

## 2015-11-21 DIAGNOSIS — Z170 Estrogen receptor positive status [ER+]: Secondary | ICD-10-CM | POA: Diagnosis not present

## 2015-11-21 DIAGNOSIS — N644 Mastodynia: Secondary | ICD-10-CM | POA: Insufficient documentation

## 2015-11-21 DIAGNOSIS — R5383 Other fatigue: Secondary | ICD-10-CM | POA: Insufficient documentation

## 2015-11-21 MED ORDER — RADIAPLEXRX EX GEL
Freq: Once | CUTANEOUS | Status: AC
Start: 2015-11-21 — End: 2015-11-21
  Administered 2015-11-21: 15:00:00 via TOPICAL

## 2015-11-21 NOTE — Progress Notes (Signed)
  Radiation Oncology         [phone] ________________________________  Name: ARROW EMMERICH MRN: [id number]  Date: 11/21/2015  DOB: [birth year]  Electron beam Simulation Verification Note    ICD-9-CM ICD-10-CM   1. Breast cancer of upper-outer quadrant of left female breast ([place]) 174.4 C50.412     Status: outpatient  NARRATIVE: The patient was brought to the treatment unit and placed in the planned treatment position. The clinical setup was verified. Then port films were obtained and uploaded to the radiation oncology medical record software.  The treatment beams were carefully compared against the planned radiation fields. The position location and shape of the radiation fields was reviewed. They targeted volume of tissue appears to be appropriately covered by the radiation beams. Organs at risk appear to be excluded as planned.  Based on my personal review, I approved the simulation verification. The patient's treatment will proceed as planned.  -----------------------------------  Blair Promise, PhD, MD

## 2015-11-21 NOTE — Progress Notes (Signed)
Kendra Watts has completed 16 fractions to her left breast.  She reports having occasional sharp pains in her left breast.  She reports having fatigue today.  She is using radiaplex and has been given a refill.  The skin on her left breast is red.  BP 97/68 mmHg  Pulse 81  Temp(Src) 98.4 F (36.9 C) (Oral)  Ht 5' (1.524 m)  Wt 91 lb (41.277 kg)  BMI 17.77 kg/m2   Wt Readings from Last 3 Encounters:  11/21/15 91 lb (41.277 kg)  11/17/15 91 lb (41.277 kg)  11/14/15 92 lb 8 oz (41.958 kg)

## 2015-11-21 NOTE — Progress Notes (Signed)
  Radiation Oncology         [phone] ________________________________  Name: Kendra Watts MRN: [id number]  Date: 11/21/2015  DOB: 1945-08-13  Weekly Radiation Therapy Management    ICD-9-CM ICD-10-CM   1. Breast cancer of upper-outer quadrant of left female breast (HCC) 174.4 C50.412 hyaluronate sodium (RADIAPLEXRX) gel     Current Dose: 42.72 Gy     Planned Dose:  52.72 Gy  Narrative . . . . . . . . The patient presents for routine under treatment assessment.                                  Kendra Watts has completed 16 fractions to her left breast. She reports having occasional sharp pains in her left breast. She reports having fatigue today. She is using radiaplex and has been given a refill. The nurse notes that the skin on her left breast is red.                                 Set-up films were reviewed.                                 The chart was checked. Physical Findings. . .  height is 5' (1.524 m) and weight is 91 lb (41.277 kg). Her oral temperature is 98.4 F (36.9 C). Her blood pressure is 97/68 and her pulse is 81. . The lungs are clear. The heart has a regular rhythm and rate. The upper inner aspect of left breast area shows erythema with no skin breakdown. Impression . . . . . . . The patient is tolerating radiation. Plan . . . . . . . . . . . . Continue treatment as planned.  ________________________________   Blair Promise, PhD, MD  This document serves as a record of services personally performed by [name], MD. It was created on his behalf by Darcus Austin, a trained medical scribe. The creation of this record is based on the scribe's personal observations and the provider's statements to them. This document has been checked and approved by the attending provider.

## 2015-11-22 ENCOUNTER — Ambulatory Visit
Admission: RE | Admit: 2015-11-22 | Discharge: 2015-11-22 | Disposition: A | Discharge status: Home / Self Care | Payer: Medicare Other | Source: Ambulatory Visit | Attending: Radiation Oncology | Admitting: Radiation Oncology

## 2015-11-22 ENCOUNTER — Ambulatory Visit: Payer: Medicare Other

## 2015-11-22 DIAGNOSIS — Z510 Encounter for antineoplastic radiation therapy: Secondary | ICD-10-CM | POA: Diagnosis not present

## 2015-11-22 DIAGNOSIS — C50412 Malignant neoplasm of upper-outer quadrant of left female breast: Secondary | ICD-10-CM | POA: Diagnosis not present

## 2015-11-22 DIAGNOSIS — Z170 Estrogen receptor positive status [ER+]: Secondary | ICD-10-CM | POA: Diagnosis not present

## 2015-11-23 ENCOUNTER — Ambulatory Visit
Admission: RE | Admit: 2015-11-23 | Discharge: 2015-11-23 | Disposition: A | Discharge status: Home / Self Care | Payer: Medicare Other | Source: Ambulatory Visit | Attending: Radiation Oncology | Admitting: Radiation Oncology

## 2015-11-23 ENCOUNTER — Encounter (HOSPITAL_COMMUNITY): Payer: Self-pay

## 2015-11-23 DIAGNOSIS — C50412 Malignant neoplasm of upper-outer quadrant of left female breast: Secondary | ICD-10-CM | POA: Diagnosis not present

## 2015-11-23 DIAGNOSIS — Z170 Estrogen receptor positive status [ER+]: Secondary | ICD-10-CM | POA: Diagnosis not present

## 2015-11-23 DIAGNOSIS — Z510 Encounter for antineoplastic radiation therapy: Secondary | ICD-10-CM | POA: Diagnosis not present

## 2015-11-24 ENCOUNTER — Ambulatory Visit
Admission: RE | Admit: 2015-11-24 | Discharge: 2015-11-24 | Disposition: A | Discharge status: Home / Self Care | Payer: Medicare Other | Source: Ambulatory Visit | Attending: Radiation Oncology | Admitting: Radiation Oncology

## 2015-11-24 DIAGNOSIS — C50412 Malignant neoplasm of upper-outer quadrant of left female breast: Secondary | ICD-10-CM | POA: Diagnosis not present

## 2015-11-24 DIAGNOSIS — Z170 Estrogen receptor positive status [ER+]: Secondary | ICD-10-CM | POA: Diagnosis not present

## 2015-11-24 DIAGNOSIS — Z510 Encounter for antineoplastic radiation therapy: Secondary | ICD-10-CM | POA: Diagnosis not present

## 2015-11-27 ENCOUNTER — Ambulatory Visit: Payer: Medicare Other

## 2015-11-27 ENCOUNTER — Ambulatory Visit
Admission: RE | Admit: 2015-11-27 | Discharge: 2015-11-27 | Disposition: A | Discharge status: Home / Self Care | Payer: Medicare Other | Source: Ambulatory Visit | Attending: Radiation Oncology | Admitting: Radiation Oncology

## 2015-11-27 DIAGNOSIS — Z510 Encounter for antineoplastic radiation therapy: Secondary | ICD-10-CM | POA: Diagnosis not present

## 2015-11-27 DIAGNOSIS — Z170 Estrogen receptor positive status [ER+]: Secondary | ICD-10-CM | POA: Diagnosis not present

## 2015-11-27 DIAGNOSIS — C50412 Malignant neoplasm of upper-outer quadrant of left female breast: Secondary | ICD-10-CM | POA: Diagnosis not present

## 2015-11-28 ENCOUNTER — Encounter: Payer: Self-pay | Admitting: Radiation Oncology

## 2015-11-28 ENCOUNTER — Ambulatory Visit
Admission: RE | Admit: 2015-11-28 | Discharge: 2015-11-28 | Disposition: A | Discharge status: Home / Self Care | Payer: Medicare Other | Source: Ambulatory Visit | Attending: Radiation Oncology | Admitting: Radiation Oncology

## 2015-11-28 ENCOUNTER — Ambulatory Visit: Payer: Medicare Other

## 2015-11-28 ENCOUNTER — Telehealth: Payer: Self-pay | Admitting: *Deleted

## 2015-11-28 VITALS — BP 121/76 | HR 80 | Temp 98.0°F | Ht 60.0 in | Wt 91.1 lb

## 2015-11-28 DIAGNOSIS — C50412 Malignant neoplasm of upper-outer quadrant of left female breast: Secondary | ICD-10-CM | POA: Diagnosis not present

## 2015-11-28 DIAGNOSIS — Z923 Personal history of irradiation: Secondary | ICD-10-CM

## 2015-11-28 DIAGNOSIS — Z170 Estrogen receptor positive status [ER+]: Secondary | ICD-10-CM | POA: Diagnosis not present

## 2015-11-28 DIAGNOSIS — Z510 Encounter for antineoplastic radiation therapy: Secondary | ICD-10-CM | POA: Diagnosis not present

## 2015-11-28 HISTORY — DX: Personal history of irradiation: Z92.3

## 2015-11-28 NOTE — Telephone Encounter (Signed)
  Oncology Nurse Navigator Documentation    Navigator Encounter Type: Telephone (11/28/15 1600) Telephone: [place] Call (11/28/15 1600)         Patient Visit Type: HYQMVH (11/28/15 1600) Treatment Phase: Final Radiation Tx (11/28/15 1600) Barriers/Navigation Needs: No Questions;No Needs (11/28/15 1600)   Interventions: Referrals (11/28/15 1600) Referrals: Survivorship (11/28/15 1600)                    Time Spent with Patient: 15 (11/28/15 1600)

## 2015-11-28 NOTE — Progress Notes (Signed)
Kendra Watts has completed treatment with 21 fractions to her left breast.  She reports having some tenderness in her left breast especially when she rolls over at night.  She reports having fatigue.  She is using radiaplex as directed.  She has been given a one month follow up appointment, survivorship and livestrong handouts.  The skin on her left breast is red.    BP 121/76 mmHg  Pulse 80  Temp(Src) 98 F (36.7 C) (Oral)  Ht 5' (1.524 m)  Wt 91 lb 1.6 oz (41.323 kg)  BMI 17.79 kg/m2   Wt Readings from Last 3 Encounters:  11/28/15 91 lb 1.6 oz (41.323 kg)  11/21/15 91 lb (41.277 kg)  11/17/15 91 lb (41.277 kg)

## 2015-11-28 NOTE — Progress Notes (Signed)
Weekly Management Note Current Dose:  52.72 Gy  Projected Dose:  52.72 Gy   Narrative:  The patient presents for routine under treatment assessment.  CBCT/MVCT images/Port film x-rays were reviewed.  The chart was checked. Kendra Watts has completed treatment with 21 fractions to her left breast. She reports having some tenderness in her left breast especially when she rolls over at night. She reports having fatigue. She is using radiaplex as directed. She has been given a one month follow up appointment, survivorship and livestrong handouts. The skin on her left breast is red.  Physical Findings: Weight: 91 lb 1.6 oz (41.323 kg). Unchanged. She has dermatitis medially and minimally pink left breast. No moist desquamation.  Impression:  The patient is tolerating radiation.  Plan: She completed her treatment today. She has a follow up appointment scheduled 01/11/2016. She has an appointment with [name] next Tuesday. She will have a Survivorship appointment scheduled. She will continue radiaplex.    ------------------------------------------------  Thea Silversmith, MD    This document serves as a record of services personally performed by Thea Silversmith, MD. It was created on her behalf by  Lendon Collar, a trained medical scribe. The creation of this record is based on the scribe's personal observations and the provider's statements to them. This document has been checked and approved by the attending provider.

## 2015-11-29 ENCOUNTER — Ambulatory Visit: Payer: Medicare Other

## 2015-11-30 ENCOUNTER — Other Ambulatory Visit: Payer: Self-pay | Admitting: Cardiovascular Disease

## 2015-11-30 ENCOUNTER — Ambulatory Visit: Payer: Medicare Other

## 2015-11-30 DIAGNOSIS — I739 Peripheral vascular disease, unspecified: Secondary | ICD-10-CM | POA: Diagnosis not present

## 2015-12-01 ENCOUNTER — Ambulatory Visit (HOSPITAL_COMMUNITY)
Admission: RE | Admit: 2015-12-01 | Discharge: 2015-12-01 | Disposition: A | Discharge status: Home / Self Care | Payer: Medicare Other | Source: Ambulatory Visit | Attending: Internal Medicine | Admitting: Internal Medicine

## 2015-12-01 DIAGNOSIS — R938 Abnormal findings on diagnostic imaging of other specified body structures: Secondary | ICD-10-CM | POA: Insufficient documentation

## 2015-12-01 DIAGNOSIS — I10 Essential (primary) hypertension: Secondary | ICD-10-CM | POA: Insufficient documentation

## 2015-12-01 DIAGNOSIS — I739 Peripheral vascular disease, unspecified: Secondary | ICD-10-CM | POA: Diagnosis not present

## 2015-12-01 DIAGNOSIS — Z720 Tobacco use: Secondary | ICD-10-CM | POA: Diagnosis not present

## 2015-12-01 DIAGNOSIS — E785 Hyperlipidemia, unspecified: Secondary | ICD-10-CM | POA: Insufficient documentation

## 2015-12-04 ENCOUNTER — Other Ambulatory Visit: Payer: Self-pay | Admitting: Adult Health

## 2015-12-04 DIAGNOSIS — C50412 Malignant neoplasm of upper-outer quadrant of left female breast: Secondary | ICD-10-CM

## 2015-12-05 ENCOUNTER — Ambulatory Visit (HOSPITAL_BASED_OUTPATIENT_CLINIC_OR_DEPARTMENT_OTHER): Payer: Medicare Other | Admitting: Hematology and Oncology

## 2015-12-05 ENCOUNTER — Encounter: Payer: Self-pay | Admitting: Hematology and Oncology

## 2015-12-05 ENCOUNTER — Telehealth: Payer: Self-pay | Admitting: Hematology and Oncology

## 2015-12-05 VITALS — BP 95/58 | HR 88 | Temp 98.6°F | Wt 91.7 lb

## 2015-12-05 DIAGNOSIS — C50412 Malignant neoplasm of upper-outer quadrant of left female breast: Secondary | ICD-10-CM | POA: Diagnosis not present

## 2015-12-05 DIAGNOSIS — M810 Age-related osteoporosis without current pathological fracture: Secondary | ICD-10-CM

## 2015-12-05 MED ORDER — TAMOXIFEN CITRATE 20 MG PO TABS: 20.0000 mg | ORAL_TABLET | Freq: Every day | ORAL | Status: DC

## 2015-12-05 NOTE — Assessment & Plan Note (Signed)
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 These include but not limited to insomnia, hot flashes, mood changes, vaginal dryness, bone density loss, and weight gain. We strongly believe that the benefits far outweigh the risks. Patient understands these risks and consented to starting treatment. Planned treatment duration is 5 years.  Return to clinic in 3 months for toxicity check and follow-up

## 2015-12-05 NOTE — Progress Notes (Signed)
Patient Care Team: Merrilee Seashore, MD as PCP - General (Internal Medicine) Lorretta Harp, MD as Attending Physician (Cardiology) Milus Banister, MD (Gastroenterology) Molli Posey, MD as Consulting Physician (Obstetrics and Gynecology) Sylvan Cheese, NP as Nurse Practitioner (Hematology and Oncology)  DIAGNOSIS: Breast cancer of upper-outer quadrant of left female breast [facility])   Staging form: Breast, AJCC 7th Edition     Clinical stage from 09/13/2015: Stage IA (T1a, N0, M0) - Unsigned   SUMMARY OF ONCOLOGIC HISTORY:   Breast cancer of upper-outer quadrant of left female breast ([place])   09/07/2015 Initial Diagnosis Screening detected left breast mass at 1:00 position 4 x 3 x 4 mm biopsy 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 She has osteoporosis based upon bone density done in February 2017. She denies any long-standing problems after radiation therapy in relation to the skin. She is here today to discuss the risks and benefits of oral antiestrogen therapy.  REVIEW OF SYSTEMS:   Constitutional: Denies fevers, chills or abnormal weight loss Eyes: Denies blurriness of vision Ears, nose, mouth, throat, and face: Denies mucositis or sore throat Respiratory: Denies cough, dyspnea or wheezes Cardiovascular: Denies palpitation, chest discomfort Gastrointestinal:   Denies nausea, heartburn or change in bowel habits Skin: Denies abnormal skin rashes Lymphatics: Denies new lymphadenopathy or easy bruising Neurological:Denies numbness, tingling or new weaknesses Behavioral/Psych: Mood is stable, no new changes  Extremities: No lower extremity edema Breast:  denies any pain or lumps or nodules in either breasts All other systems were reviewed with the patient and are negative.  I have reviewed the past medical history, past surgical history, social history and family history with the patient and they are unchanged from previous note.  ALLERGIES:  has No Known Allergies.  MEDICATIONS:  Current Outpatient Prescriptions  Medication Sig Dispense Refill  . acetaminophen (TYLENOL) 500 MG tablet Take 500 mg by mouth every 6 (six) hours as needed. Reported on 10/31/2015    . amLODipine (NORVASC) 10 MG tablet Take 10 mg by mouth every morning.    Marland Kitchen aspirin 81 MG tablet Take 162 mg by mouth daily.    . calcium citrate (CALCITRATE - DOSED IN MG ELEMENTAL CALCIUM) 950 MG tablet Take 1,200 mg of elemental calcium by mouth daily.    . clopidogrel (PLAVIX) 75 MG tablet Take 1 tablet (75 mg total) by mouth daily. 90 tablet 1  . doxazosin (CARDURA) 8 MG tablet Take 8 mg by mouth every morning.      . hyaluronate sodium (RADIAPLEXRX) GEL Apply 1 application topically once.    Marland Kitchen levothyroxine (SYNTHROID, LEVOTHROID) 88 MCG tablet Take 88 mcg by mouth daily before breakfast.    . losartan (COZAAR) 100 MG tablet Take 100 mg by mouth every morning.     . Multiple Vitamins-Minerals (MULTIVITAMIN WITH MINERALS) tablet Take 1 tablet by mouth every morning.     . non-metallic deodorant Jethro Poling)  MISC Apply 1 application topically daily as needed.    . rosuvastatin (CRESTOR) 10 MG tablet Take 10 mg by mouth every morning.      No current facility-administered medications for this visit.    PHYSICAL EXAMINATION: ECOG PERFORMANCE STATUS: 0 - Asymptomatic  Filed Vitals:    12/05/15 1324  BP: 95/58  Pulse: 88  Temp: 98.6 F (37 C)   Filed Weights   12/05/15 1324  Weight: 91 lb 11.2 oz (41.595 kg)    GENERAL:alert, no distress and comfortable SKIN: skin color, texture, turgor are normal, no rashes or significant lesions EYES: normal, Conjunctiva are pink and non-injected, sclera clear OROPHARYNX:no exudate, no erythema and lips, buccal mucosa, and tongue normal  NECK: supple, thyroid normal size, non-tender, without nodularity LYMPH:  no palpable lymphadenopathy in the cervical, axillary or inguinal LUNGS: clear to auscultation and percussion with normal breathing effort HEART: regular rate & rhythm and no murmurs and no lower extremity edema ABDOMEN:abdomen soft, non-tender and normal bowel sounds MUSCULOSKELETAL:no cyanosis of digits and no clubbing  NEURO: alert & oriented x 3 with fluent speech, no focal motor/sensory deficits EXTREMITIES: No lower extremity edema  LABORATORY DATA:  I have reviewed the data as listed   Chemistry      Component Value Date/Time   NA 139 09/13/2015 1224   NA 139 08/23/2014 0419   K 4.1 09/13/2015 1224   K 3.4* 08/23/2014 0419   CL 107 08/23/2014 0419   CO2 22 09/13/2015 1224   CO2 23 08/23/2014 0419   BUN 14.8 09/13/2015 1224   BUN 13 08/23/2014 0419   CREATININE 1.0 09/13/2015 1224   CREATININE 0.94 08/23/2014 0419   CREATININE 0.95 08/17/2014 1410      Component Value Date/Time   CALCIUM 9.8 09/13/2015 1224   CALCIUM 8.7 08/23/2014 0419   ALKPHOS 65 09/13/2015 1224   ALKPHOS 86 03/27/2012 1119   AST 19 09/13/2015 1224   AST 22 03/27/2012 1119   ALT 16 09/13/2015 1224   ALT 19 03/27/2012 1119   BILITOT 0.37 09/13/2015 1224   BILITOT 0.2* 03/27/2012 1119       Lab Results  Component Value Date   WBC 7.4 09/13/2015   HGB 14.4 09/13/2015   HCT 43.9 09/13/2015   MCV 96.3 09/13/2015   PLT 212 09/13/2015   NEUTROABS 5.6 09/13/2015     ASSESSMENT & PLAN:  Breast cancer of upper-outer  quadrant of left female breast ([place]) Left lumpectomy 09/22/2015: IDC grade 1, 1.1 cm, ADH, LCIS, margins negative, 0/3 lymph nodes, ER 100%, PR 90%, HER-2 negative, Ki-67 10%, T1 cN0 stage IA, Oncotype DX score 14, 9% risk of recurrence Adjuvant radiation therapy 10/30/2015 to 11/28/2015  Treatment plan: Adjuvant antiestrogen therapy with tamoxifen 20 mg by mouth daily 5 years (hosen due to osteoporosis T score of -2.5 done in February 2017) tamoxifen counseling:We discussed the risks and benefits of tamoxifen. These include but not limited to insomnia, hot flashes, mood changes, vaginal dryness, and weight gain. Although rare, serious side effects including endometrial cancer, risk of blood clots were also discussed. We strongly believe that the benefits far outweigh the risks. Patient understands these risks and consented to starting treatment. Planned treatment duration is 5 years.  Return to clinic in 3 months for toxicity check and follow-up No orders of the defined types were placed in this encounter.   The patient has a good understanding of the overall plan. she agrees with it. she will call with any problems  that may develop before the next visit here.   Rulon Eisenmenger, MD 12/05/2015

## 2015-12-05 NOTE — Telephone Encounter (Signed)
Gave patient avs report and appointments for July and November. Per 5/16 pof patient to f/u in 3 months. Per VG due to patient seeing HM 02/08/16 he will see patient in 6 months. Patient aware.

## 2015-12-06 NOTE — Progress Notes (Signed)
  Radiation Oncology         [phone] ________________________________  Name: Kendra Watts MRN: [id number]  Date: 11/28/2015  DOB: [birth year]  End of Treatment Note   ICD-9-CM ICD-10-CM    1. Breast cancer of upper-outer quadrant of left female breast ([facility]) 174.4 C50.412     DIAGNOSIS: Low grade invasive ductal carcinoma, ER (100%), PR (90%), Her2-neu negative. Stage pT1c, pN0     Indication for treatment:  Post operative       Radiation treatment dates:   10/30/2015-11/28/2015  Site/dose:    1. The Left breast was treated to 42.72 Gy in 16 fractions at 2.67 Gy per fraction. 2. The Left breast was boosted to 10 Gy in 5 fractions at 2 Gy per fraction.  Beams/energy:    1. 3D-Conformal (// 6X, tangent beams 2. Electron Monte Carlo (deprecated) // 12 MeV  Narrative: The patient tolerated radiation treatment relatively well.  She experienced some tenderness in her left breast with dermatitis medially and minimally pink left breast. No moist desquamation. She also reported mild fatigue.   Plan: The patient has completed radiation treatment. The patient will return to radiation oncology clinic for routine followup in one month. I advised them to call or return sooner if they have any questions or concerns related to their recovery or treatment.  -----------------------------------  Blair Promise, PhD, MD  This document serves as a record of services personally performed by [name], MD. It was created on his behalf by Arlyce Harman, a trained medical scribe. The creation of this record is based on the scribe's personal observations and the provider's statements to them. This document has been checked and approved by the attending provider.

## 2015-12-07 ENCOUNTER — Telehealth: Payer: Self-pay

## 2015-12-07 DIAGNOSIS — I739 Peripheral vascular disease, unspecified: Secondary | ICD-10-CM

## 2015-12-07 NOTE — Telephone Encounter (Signed)
-----   Message from Lorretta Harp, MD sent at 12/03/2015  5:01 PM EDT ----- No change from prior study. Repeat in 12 months.

## 2015-12-25 ENCOUNTER — Telehealth: Payer: Self-pay

## 2015-12-25 NOTE — Telephone Encounter (Signed)
Received VM from pt regarding medication management.  Pt prescribed tamoxifen by [name].  Pt states her gynecologist wanted to prescribe her Evista.  Information relayed to [name], MD who did not recommend these medications be taken together.  Verbal order given to start pt on fosamax weekly for osteoporosis.  Called pt back to discuss this.  Pt concerned with this medication given her kidney function.  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  Informed pt I would speak with [name] regarding this.  Decision made for pt to do prolia injections twice per year for this issue.  Verbal order received and I contacted pt to relay this new information.  Pt states this is a medication her gynecologist said was not a good medication given her kidney function.  I informed her Gudena, MD felt this was the best option for her and he was aware of her kidney function as previously discussed.  Pt weary and wishes to speak with her gynecologist's office.  Pt states she will contact them, make a decision, and let us know what she wishes to do.  Pt without further questions and concerns at time of call.

## 2016-01-04 DIAGNOSIS — M818 Other osteoporosis without current pathological fracture: Secondary | ICD-10-CM | POA: Diagnosis not present

## 2016-01-08 ENCOUNTER — Encounter: Payer: Self-pay | Admitting: Oncology

## 2016-01-11 ENCOUNTER — Encounter: Payer: Self-pay | Admitting: Radiation Oncology

## 2016-01-11 ENCOUNTER — Ambulatory Visit
Admission: RE | Admit: 2016-01-11 | Discharge: 2016-01-11 | Disposition: A | Discharge status: Home / Self Care | Payer: Medicare Other | Source: Ambulatory Visit | Attending: Radiation Oncology | Admitting: Radiation Oncology

## 2016-01-11 VITALS — BP 92/54 | HR 82 | Temp 98.1°F | Resp 16 | Ht <= 58 in | Wt 90.3 lb

## 2016-01-11 DIAGNOSIS — Z79899 Other long term (current) drug therapy: Secondary | ICD-10-CM | POA: Diagnosis not present

## 2016-01-11 DIAGNOSIS — Z7982 Long term (current) use of aspirin: Secondary | ICD-10-CM | POA: Diagnosis not present

## 2016-01-11 DIAGNOSIS — C50412 Malignant neoplasm of upper-outer quadrant of left female breast: Secondary | ICD-10-CM | POA: Insufficient documentation

## 2016-01-11 DIAGNOSIS — Z923 Personal history of irradiation: Secondary | ICD-10-CM | POA: Diagnosis not present

## 2016-01-11 NOTE — Progress Notes (Signed)
Kendra Watts  is here for a one month  follow up visit for breast cancer upper-inner quadrant of left female breast.  Skin status:Has slight hyperpigmentation to left breast. Lotion being used: Using lotion with coco butter to left breast. Have you seen your medical oncologist? Date If not ,when is appointment 06-07-16 [name] When is yiur next mammogram? Has it been scheduled ? : Not scheduled ER+,have started AI or Tamoxifen? If not, why? 12-05-15 Tamoxifen Discuss survivorship appointment:02-08-16     [name] Offer referral reading material for Survivorship, [facility] and [facility] given  02-08-16 Appetite: Fair drinking boost when she thinks about it. Pain:No Arm mobility:Able to raise left arm without difficulty. Fatigue:Having fatigue during the day. BP 92/54 mmHg  Pulse 82  Temp(Src) 98.1 F (36.7 C) (Oral)  Resp 16  Ht 5" (0.127 m)  Wt 90 lb 4.8 oz (40.96 kg)  BMI 2539.53 kg/m2  SpO2 98%

## 2016-01-11 NOTE — Addendum Note (Signed)
Encounter addended by: Jacqulyn Liner, RN on: 01/11/2016  1:05 PM<BR>     Documentation filed: Charges VN

## 2016-01-11 NOTE — Progress Notes (Signed)
Radiation Oncology         [phone] ________________________________  Name: Kendra Watts MRN: [id number]  Date: 01/11/2016  DOB: 1945-08-11  Follow-Up Visit Note  CC: Merrilee Seashore, MD  Fanny Skates, MD    ICD-9-CM ICD-10-CM   1. Breast cancer of upper-outer quadrant of left female breast ([place]) 174.4 C50.412     Diagnosis: Low grade invasive ductal carcinoma of the left breast, ER (100%), PR (90%), Her2-neu negative.Stage IA pT1c, pN0  Interval Since Last Radiation: 6 weeks  10/30/2015-11/28/2015: 1. The Left breast was treated to 42.72 Gy in 16 fractions at 2.67 Gy per fraction. 2. The Left breast was boosted to 10 Gy in 5 fractions at 2 Gy per fraction.  Narrative:  The patient returns today for routine follow-up. The patient in on Tamoxifen and has been taking it since 12/05/15. She is applying cocoa butter to the left breast. She reports a fair appetite and supplements with Boost. She denies pain, is able to list her left arm without difficulty, and reports fatigue.  ALLERGIES:  has No Known Allergies.  Meds: Current Outpatient Prescriptions  Medication Sig Dispense Refill  . amLODipine (NORVASC) 10 MG tablet Take 10 mg by mouth every morning.    Marland Kitchen aspirin 81 MG tablet Take 162 mg by mouth daily.    . calcium citrate (CALCITRATE - DOSED IN MG ELEMENTAL CALCIUM) 950 MG tablet Take 1,200 mg of elemental calcium by mouth daily.    . clopidogrel (PLAVIX) 75 MG tablet Take 1 tablet (75 mg total) by mouth daily. 90 tablet 1  . doxazosin (CARDURA) 8 MG tablet Take 8 mg by mouth every morning.      Marland Kitchen levothyroxine (SYNTHROID, LEVOTHROID) 88 MCG tablet Take 88 mcg by mouth daily before breakfast.    . losartan (COZAAR) 100 MG tablet Take 100 mg by mouth every morning.     . Multiple Vitamins-Minerals (MULTIVITAMIN WITH MINERALS) tablet Take 1 tablet by mouth every morning.     . rosuvastatin (CRESTOR) 10 MG tablet Take 10 mg by mouth every morning.     . tamoxifen  (NOLVADEX) 20 MG tablet Take 1 tablet (20 mg total) by mouth daily. 90 tablet 3   No current facility-administered medications for this encounter.    Physical Findings: The patient is in no acute distress. Patient is alert and oriented.  height is 5" (0.127 m) and weight is 90 lb 4.8 oz (40.96 kg). Her oral temperature is 98.1 F (36.7 C). Her blood pressure is 92/54 and her pulse is 82. Her respiration is 16 and oxygen saturation is 98%.   Lungs are clear to auscultation bilaterally. Heart has regular rate and rhythm. No palpable cervical, supraclavicular, or axillary adenopathy. The right has no palpable mass, nipple discharge, or bleeding. The left breast shows continued edema and hyperpigmentation changes. No dominant mass appreciated in the breast. No nipple discharge or bleeding. Skin well-healed.  Lab Findings: Lab Results  Component Value Date   WBC 7.4 09/13/2015   HGB 14.4 09/13/2015   HCT 43.9 09/13/2015   MCV 96.3 09/13/2015   PLT 212 09/13/2015    Radiographic Findings: No results found.  Impression:  The patient is recovering from the effects of radiation.  Plan: The patient is scheduled for Survivorship on 02/08/16 and will follow up with [name] on 06/07/16. The patient will follow up with rad onc in 3 months.  ____________________________________ -----------------------------------  Blair Promise, PhD, MD  This document serves as a record  of services personally performed by [name], MD. It was created on his behalf by Darcus Austin, a trained medical scribe. The creation of this record is based on the scribe's personal observations and the provider's statements to them. This document has been checked and approved by the attending provider.

## 2016-02-07 NOTE — Progress Notes (Signed)
CLINIC:  Survivorship   REASON FOR VISIT:  Routine follow-up post-treatment for a recent history of breast cancer.  BRIEF ONCOLOGIC HISTORY:    Breast cancer of upper-outer quadrant of left female breast ([place])   08/30/2015 Imaging DEXA scan: Normal    09/07/2015 Initial Diagnosis Screening detected left breast mass at 1:00 position 4 x 3 x 4 mm biopsy grade 1 invasive ductal carcinoma ER 100% PR 90% positive HER-2 negative ratio 1.18 Ki-67 10%, T1 aN0 stage IA clinical stage   09/22/2015 Surgery Left lumpectomy [name]): IDC grade 1, 1.1 minutes, ADH, LCIS, margins negative, 0/3 lymph nodes, ER 100%, PR 90%, HER-2 negative, Ki-67 10%, T1 cN0 stage IA, Oncotype DX score 14, 9% ROR   10/30/2015 - 11/28/2015 Radiation Therapy Adjuvant XRT ([facility]). Left breast: 42.72 Gy in 16 treatments.  Left breast "boost": 10 Gy in 5 treatments.  Total: 52.72 Gy in 21 treatments    11/2015 -  Anti-estrogen oral therapy Anastrazole '1mg'$  daily. Planned duration of treatment: 5 years Kendra Watts)    INTERVAL HISTORY:  [name] presents to the [facility] today for our initial meeting to review her survivorship care plan detailing her treatment course for breast cancer, as well as monitoring long-term side effects of that treatment, education regarding health maintenance, screening, and overall wellness and health promotion.     Overall, Ms. Brunner reports feeling quite well since completing her radiation therapy approximately 2 months ago.  She has some breast tenderness in her (L) breast.  She started the Tamoxifen in 11/2015 (which was changed from anastrazole given her osteoporosis).  She has some hot flashes, "but they aren't very bad."  She has some fatigue, but this is improving.  She still smokes, a little over 1 ppd.  She is not ready to quit.   REVIEW OF SYSTEMS:  Review of Systems  Constitutional: Positive for malaise/fatigue. Negative for fever.       Some fatigue   Gastrointestinal: Negative for  nausea, vomiting, diarrhea and constipation.  Musculoskeletal: Negative for myalgias and joint pain.  Skin: Negative for rash.  Neurological: Negative for dizziness and headaches.  Psychiatric/Behavioral: Negative for depression. The patient is not nervous/anxious.   GU: Denies vaginal bleeding, discharge, or dryness.  Breast: Denies any new nodularity, masses, tenderness, nipple changes, or nipple discharge.    A 14-point review of systems was completed and was negative, except as noted above.   ONCOLOGY TREATMENT TEAM:  1. Surgeon:  Dr. [name] at [facility] 2. Medical Oncologist: Dr. Lindi Watts 3. Radiation Oncologist: Dr. Sondra Come    PAST MEDICAL/SURGICAL HISTORY:  Past Medical History  Diagnosis Date  . Hyperlipidemia   . Tobacco abuse   . Breast cancer of upper-outer quadrant of left female breast ([place]) 09/08/2015  . Hypothyroidism   . History of hyperthyroidism   . Hypertension     states under control with meds., has been on med. x "long time"  . Heart murmur     states no known problems  . Aortic arch anomaly     arteria [place]   . Renal artery stenosis ([place])   . Nonfunctioning kidney     left  . Cataract, immature   . Wears partial dentures     upper and lower  . Radiation 10/30/15-11/28/15    left breast 42.72 Gy, boosted to 10 Gy   Past Surgical History  Procedure Laterality Date  . Arch aortogram    . Renal angiogram Left 06/08/2010    renal  artery stent -  5x12 Genesis on Aviator balloon stent (Dr. Adora Fridge)  . Appendectomy    . Tonsillectomy      as a child  . Endarterectomy  04/02/2012    Procedure: ENDARTERECTOMY CAROTID;  Surgeon: Serafina Mitchell, MD;  Location: [facility];  Service: Vascular;  Laterality: Right;  . Carotid angiogram N/A 02/18/2012    Procedure: CAROTID ANGIOGRAM;  Surgeon: Lorretta Harp, MD;  Location: [facility] CATH LAB;  Service: Cardiovascular;  Laterality: N/A;  . Abdominal angiogram  02/18/2012    Procedure: ABDOMINAL ANGIOGRAM;   Surgeon: Lorretta Harp, MD;  Location: [facility] CATH LAB;  Service: Cardiovascular;;  . Renal angiogram Right 07/04/2014    Procedure: RENAL ANGIOGRAM;  Surgeon: Lorretta Harp, MD;  Location: [facility] CATH LAB;  Service: Cardiovascular;  Laterality: Right;  . Abdominal hysterectomy  ~ 1977    partial  . Renal angiogram Right 08/22/2014    Procedure: RENAL ANGIOGRAM;  Surgeon: Lorretta Harp, MD;  Location: [facility] CATH LAB;  Service: Cardiovascular;  Laterality: Right;  . Abdominal aortagram  07/04/2014  . Radioactive seed guided mastectomy with axillary sentinel lymph node biopsy Left 09/22/2015    Procedure: INJECT BLUE DYE LEFT BREAST,RADIOACTIVE SEED GUIDED PARTIAL MASTECTOMY WITH AXILLARY SENTINEL LYMPH NODE BIOPSY;  Surgeon: Fanny Skates, MD;  Location: [facility];  Service: General;  Laterality: Left;     ALLERGIES:  No Known Allergies   CURRENT MEDICATIONS:  Outpatient Encounter Prescriptions as of 02/08/2016  Medication Sig Note  . amLODipine (NORVASC) 10 MG tablet Take 10 mg by mouth every morning.   Marland Kitchen aspirin 81 MG tablet Take 162 mg by mouth daily.   . calcium citrate (CALCITRATE - DOSED IN MG ELEMENTAL CALCIUM) 950 MG tablet Take 1,200 mg of elemental calcium by mouth daily.   . clopidogrel (PLAVIX) 75 MG tablet Take 1 tablet (75 mg total) by mouth daily. 09/15/2015: Last dose prior to surgery will be 09/17/2015  . doxazosin (CARDURA) 8 MG tablet Take 8 mg by mouth every morning.     Marland Kitchen levothyroxine (SYNTHROID, LEVOTHROID) 88 MCG tablet Take 88 mcg by mouth daily before breakfast.   . losartan (COZAAR) 100 MG tablet Take 100 mg by mouth every morning.    . Multiple Vitamins-Minerals (MULTIVITAMIN WITH MINERALS) tablet Take 1 tablet by mouth every morning.    . rosuvastatin (CRESTOR) 10 MG tablet Take 10 mg by mouth every morning.    . tamoxifen (NOLVADEX) 20 MG tablet Take 1 tablet (20 mg total) by mouth daily.    No facility-administered encounter medications on file as  of 02/08/2016.     ONCOLOGIC FAMILY HISTORY:  Family History  Problem Relation Age of Onset  . Heart disease Maternal Grandmother   . Heart disease Mother     MI @ 40, died at 16  . Cancer Father   . Lung cancer Father   . Lung cancer Sister   . Breast cancer Paternal Aunt      GENETIC COUNSELING/TESTING: None.  SOCIAL HISTORY:  CHINMAYI RUMER lives in [place], [place].  She is currently retired.  She currently smokes over 1 ppd.  She denies any current alcohol or illicit drug use.    PHYSICAL EXAMINATION:  Vital Signs:   Filed Vitals:   02/08/16 1424  BP: 78/58  Pulse: 86  Temp: 98.4 F (36.9 C)  Resp: 18   Filed Weights   02/08/16 1424  Weight: 89 lb 11.2 oz (40.688 kg)  General: Thin, well-appearing female in no acute distress.  She is unaccompanied today.   HEENT: Head is normocephalic.  Pupils equal and reactive to light and accomodation. Conjunctivae clear without exudate.  Sclerae anicteric. Oral mucosa is pink, moist.  Oropharynx is pink without lesions or erythema.  Lymph: No cervical, supraclavicular, or infraclavicular lymphadenopathy noted on palpation.  Cardiovascular: Regular rate and rhythm.Marland Kitchen Respiratory: Clear to auscultation bilaterally. Chest expansion symmetric; breathing non-labored.  GI: Abdomen soft and round; non-tender, non-distended. Bowel sounds normoactive.  GU: Deferred.  Neuro: No focal deficits. Steady gait.  Psych: Mood and affect normal and appropriate for situation.  Extremities: No edema. Skin: Warm and dry.  LABORATORY DATA:  None for this visit.  DIAGNOSTIC IMAGING:  None for this visit.      ASSESSMENT AND PLAN:  Ms.. Urquilla is a pleasant 70 y.o. female with Stage IA left breast invasive ductal carcinoma, ER+/PR+/HER2-, diagnosed in 08/2015, treated with lumpectomy, adjuvant radiation therapy, and anti-estrogen therapy with Tamoxifen x 5 years beginning in 11/2015.  She presents to the Survivorship Clinic for our initial  meeting and routine follow-up post-completion of treatment for breast cancer.    1. Stage IA left breast cancer:  [name] is continuing to recover from definitive treatment for breast cancer.  She will follow-up with her medical oncologist, Dr. Lindi Watts in 05/2016 with history and physical exam per surveillance protocol.  She will see Dr. Sondra Come, her radiation oncologist, in the interim in 03/2016 for routine follow-up.  She will continue her anti-estrogen therapy with Tamoxifen as prescribed by Dr. Lindi Watts. She was instructed to make Dr. Lindi Watts or myself aware if she begins to experience any side effects of the medication and I could see her back in clinic to help manage those side effects, as needed. Thus far, she is tolerating the tamoxifen quite well without many side effects, aside from occasional hot flashes.  A comprehensive survivorship care plan and treatment summary was reviewed with the patient today detailing her breast cancer diagnosis, treatment course, potential late/long-term effects of treatment, appropriate follow-up care with recommendations for the future, and patient education resources.  A copy of this summary, along with a letter will be sent to the patient's primary care provider via mail/fax/In Basket message after today's visit.    2. Fatigue: Her complaints of fatigue are likely multifactorial and related to her recent completion of cancer treatments.  We discussed that physical activity is the best way to combat treatment-related fatigue.  We discussed the [employer] fitness program, which is designed for cancer survivors to help them become more physically fit after cancer treatments.  She was given a brochure about [employer] with instructions on who to contact to get enrolled if she chooses.   3. Smoking cessation: I stressed the importance of cutting down and stopping smoking. She is not ready to quit yet. I let her know that when she is ready, we have access to the  1-800-QUIT-NOW resource for our patients, which provides emotional and nicotine-replacement products for patients free of charge.  Consider low-dose CT chest in the future to screen for lung cancer.    4. Bone health:  Given Ms. Samaras's age, history of breast cancer, and her smoking history, she is at risk for bone demineralization.  Per our records, her last DEXA scan was 08/30/15, which showed osteoporosis. She was encouraged to increase her consumption of foods rich in calcium, as well as increase her weight-bearing activities.  She was given education on specific  activities to promote bone health.  She was also encouraged to cut down and stop smoking.   5. Cancer screening:  Due to Ms. Bender's history and her age, she should receive screening for skin cancers, colon cancer, and gynecologic cancers.  The information and recommendations are listed on the patient's comprehensive care plan/treatment summary and were reviewed in detail with the patient.     6. Health maintenance and wellness promotion: Ms. Storlie was encouraged to consume 5-7 servings of fruits and vegetables per day. We reviewed the "Nutrition Rainbow" handout, as well as the handout about "Nutrition for Breast Cancer Survivors."  She was also encouraged to engage in moderate to vigorous exercise for 30 minutes per day most days of the week.  She was instructed to limit her alcohol consumption and was encouraged stop smoking.     7. Support services/counseling: It is not uncommon for this period of the patient's cancer care trajectory to be one of many emotions and stressors.  We discussed an opportunity for her to participate in the next session of [facility] ("Finding Your New Normal") support group series designed for patients after they have completed treatment.   Ms. Brum was encouraged to take advantage of our many other support services programs, support groups, and/or counseling in coping with her new life as a cancer survivor after  completing anti-cancer treatment.  She was offered support today through active listening and expressive supportive counseling.  She was given information regarding our available services and encouraged to contact me with any questions or for help enrolling in any of our support group/programs.    Dispo:   -Return to cancer center to see Dr. Sondra Come in 03/2016 -See Dr. Lindi Watts in 05/2016  -She is welcome to return back to the Survivorship Clinic at any time; no additional follow-up needed at this time.  -Consider referral back to survivorship as a long-term survivor for continued surveillance  A total of 45 minutes of face-to-face time was spent with this patient with greater than 50% of that time in counseling and care-coordination.   Mike Craze, NP Survivorship Program [place] [phone]   Note: PRIMARY CARE PROVIDER [place], [place] [phone]

## 2016-02-08 ENCOUNTER — Encounter: Payer: Self-pay | Admitting: Adult Health

## 2016-02-08 ENCOUNTER — Ambulatory Visit (HOSPITAL_BASED_OUTPATIENT_CLINIC_OR_DEPARTMENT_OTHER): Payer: Medicare Other | Admitting: Adult Health

## 2016-02-08 ENCOUNTER — Encounter: Payer: Medicare Other | Admitting: Nurse Practitioner

## 2016-02-08 VITALS — BP 78/58 | HR 86 | Temp 98.4°F | Resp 18 | Wt 89.7 lb

## 2016-02-08 DIAGNOSIS — C50412 Malignant neoplasm of upper-outer quadrant of left female breast: Secondary | ICD-10-CM | POA: Diagnosis not present

## 2016-02-08 DIAGNOSIS — Z720 Tobacco use: Secondary | ICD-10-CM

## 2016-02-08 DIAGNOSIS — M810 Age-related osteoporosis without current pathological fracture: Secondary | ICD-10-CM | POA: Diagnosis not present

## 2016-02-08 DIAGNOSIS — R5383 Other fatigue: Secondary | ICD-10-CM | POA: Diagnosis not present

## 2016-02-09 DIAGNOSIS — M25531 Pain in right wrist: Secondary | ICD-10-CM | POA: Diagnosis not present

## 2016-02-17 DIAGNOSIS — G501 Atypical facial pain: Secondary | ICD-10-CM | POA: Diagnosis not present

## 2016-02-17 DIAGNOSIS — K047 Periapical abscess without sinus: Secondary | ICD-10-CM | POA: Diagnosis not present

## 2016-03-21 DIAGNOSIS — I701 Atherosclerosis of renal artery: Secondary | ICD-10-CM | POA: Diagnosis not present

## 2016-03-21 DIAGNOSIS — R2232 Localized swelling, mass and lump, left upper limb: Secondary | ICD-10-CM | POA: Diagnosis not present

## 2016-03-21 DIAGNOSIS — I10 Essential (primary) hypertension: Secondary | ICD-10-CM | POA: Diagnosis not present

## 2016-03-21 DIAGNOSIS — C50412 Malignant neoplasm of upper-outer quadrant of left female breast: Secondary | ICD-10-CM | POA: Diagnosis not present

## 2016-03-21 DIAGNOSIS — Z9889 Other specified postprocedural states: Secondary | ICD-10-CM | POA: Diagnosis not present

## 2016-03-21 DIAGNOSIS — Z923 Personal history of irradiation: Secondary | ICD-10-CM | POA: Diagnosis not present

## 2016-03-21 DIAGNOSIS — Z7901 Long term (current) use of anticoagulants: Secondary | ICD-10-CM | POA: Diagnosis not present

## 2016-03-21 DIAGNOSIS — I679 Cerebrovascular disease, unspecified: Secondary | ICD-10-CM | POA: Diagnosis not present

## 2016-03-21 DIAGNOSIS — Z720 Tobacco use: Secondary | ICD-10-CM | POA: Diagnosis not present

## 2016-03-22 ENCOUNTER — Other Ambulatory Visit: Payer: Self-pay | Admitting: General Surgery

## 2016-03-22 DIAGNOSIS — N63 Unspecified lump in unspecified breast: Secondary | ICD-10-CM

## 2016-03-27 DIAGNOSIS — Z85828 Personal history of other malignant neoplasm of skin: Secondary | ICD-10-CM | POA: Diagnosis not present

## 2016-03-27 DIAGNOSIS — L820 Inflamed seborrheic keratosis: Secondary | ICD-10-CM | POA: Diagnosis not present

## 2016-03-28 ENCOUNTER — Ambulatory Visit
Admission: RE | Admit: 2016-03-28 | Discharge: 2016-03-28 | Disposition: A | Discharge status: Home / Self Care | Payer: Medicare Other | Source: Ambulatory Visit | Attending: General Surgery | Admitting: General Surgery

## 2016-03-28 DIAGNOSIS — R928 Other abnormal and inconclusive findings on diagnostic imaging of breast: Secondary | ICD-10-CM | POA: Diagnosis not present

## 2016-03-28 DIAGNOSIS — N63 Unspecified lump in unspecified breast: Secondary | ICD-10-CM

## 2016-03-28 DIAGNOSIS — N6489 Other specified disorders of breast: Secondary | ICD-10-CM | POA: Diagnosis not present

## 2016-04-18 ENCOUNTER — Encounter: Payer: Self-pay | Admitting: Radiation Oncology

## 2016-04-18 ENCOUNTER — Ambulatory Visit
Admission: RE | Admit: 2016-04-18 | Discharge: 2016-04-18 | Disposition: A | Discharge status: Home / Self Care | Payer: Medicare Other | Source: Ambulatory Visit | Attending: Radiation Oncology | Admitting: Radiation Oncology

## 2016-04-18 DIAGNOSIS — Z79899 Other long term (current) drug therapy: Secondary | ICD-10-CM | POA: Diagnosis not present

## 2016-04-18 DIAGNOSIS — C50412 Malignant neoplasm of upper-outer quadrant of left female breast: Secondary | ICD-10-CM

## 2016-04-18 DIAGNOSIS — Z923 Personal history of irradiation: Secondary | ICD-10-CM | POA: Insufficient documentation

## 2016-04-18 DIAGNOSIS — Z7982 Long term (current) use of aspirin: Secondary | ICD-10-CM | POA: Diagnosis not present

## 2016-04-18 NOTE — Progress Notes (Addendum)
Kendra Watts here for follow up after treatment to her left breast.  She denies having pain although she does have discomfort when she lays on her left side.  She is taking Tamoxifen.  She reports having fatigue.  The skin on her left breast has pinkness/hyperpigmentation.  BP 101/84 (BP Location: Right Wrist, Patient Position: Sitting, Cuff Size: Small)   Pulse 92   Temp 98.1 F (36.7 C) (Oral)   Ht 5' (1.524 m)   Wt 89 lb 3.2 oz (40.5 kg)   SpO2 93%   BMI 17.42 kg/m    Wt Readings from Last 3 Encounters:  04/18/16 89 lb 3.2 oz (40.5 kg)  02/08/16 89 lb 11.2 oz (40.7 kg)  01/11/16 90 lb 4.8 oz (41 kg)

## 2016-04-18 NOTE — Progress Notes (Signed)
Radiation Oncology         ([phone]) [phone] ________________________________  Name: Kendra Watts MRN: [id number]  Date: 04/18/2016  DOB: 10-30-1945  Follow-Up Visit Note  CC: Merrilee Seashore, MD  Fanny Skates, MD    ICD-9-CM ICD-10-CM   1. Breast cancer of upper-outer quadrant of left female breast ([place]) 174.4 C50.412     Diagnosis: Low grade invasive ductal carcinoma of the left breast, ER (100%), PR (90%), Her2-neu negative.Stage IA pT1c, pN0  Interval Since Last Radiation: 4 months  10/30/2015-11/28/2015: 1. The Left breast was treated to 42.72 Gy in 16 fractions at 2.67 Gy per fraction. 2. The Left breast was boosted to 10 Gy in 5 fractions at 2 Gy per fraction.  Narrative:  The patient returns today for routine follow-up. The patient in on Tamoxifen and has been taking it since 12/05/15. She is applying cocoa butter to the left breast. She reports a fair appetite and supplements with Boost. She denies pain, is able to lift her left arm without difficulty, and reports fatigue.  On 03/28/16, the patient had a left breast mammogram and left axillary ultrasound for a palpable abnormality in the left axilla. The abnormality corresponded with a probable small seroma.  ALLERGIES:  has No Known Allergies.  Meds: Current Outpatient Prescriptions  Medication Sig Dispense Refill  . amLODipine (NORVASC) 10 MG tablet Take 10 mg by mouth every morning.    Marland Kitchen aspirin 81 MG tablet Take 162 mg by mouth daily.    . calcium citrate (CALCITRATE - DOSED IN MG ELEMENTAL CALCIUM) 950 MG tablet Take 1,200 mg of elemental calcium by mouth daily.    . clopidogrel (PLAVIX) 75 MG tablet Take 1 tablet (75 mg total) by mouth daily. 90 tablet 1  . doxazosin (CARDURA) 8 MG tablet Take 8 mg by mouth every morning.      Marland Kitchen levothyroxine (SYNTHROID, LEVOTHROID) 88 MCG tablet Take 88 mcg by mouth daily before breakfast.    . losartan (COZAAR) 100 MG tablet Take 100 mg by mouth every morning.     . Multiple  Vitamins-Minerals (MULTIVITAMIN WITH MINERALS) tablet Take 1 tablet by mouth every morning.     . rosuvastatin (CRESTOR) 10 MG tablet Take 10 mg by mouth every morning.     . tamoxifen (NOLVADEX) 20 MG tablet Take 1 tablet (20 mg total) by mouth daily. 90 tablet 3   No current facility-administered medications for this encounter.     Physical Findings: The patient is in no acute distress. Patient is alert and oriented.  height is 5' (1.524 m) and weight is 89 lb 3.2 oz (40.5 kg). Her oral temperature is 98.1 F (36.7 C). Her blood pressure is 101/84 and her pulse is 92. Her oxygen saturation is 93%.   Lungs are clear to auscultation bilaterally. Heart has regular rate and rhythm. No palpable cervical, supraclavicular, or right axillary adenopathy.  1.5 cm area of smooth induration in the left axilla consistent with a seroma. The left breast shows some mild hyperpigmentation changes with mild edema, no dominant mass appreciated. Right breast no mass or nipple bleeding/discharge.  Lab Findings: Lab Results  Component Value Date   WBC 7.4 09/13/2015   HGB 14.4 09/13/2015   HCT 43.9 09/13/2015   MCV 96.3 09/13/2015   PLT 212 09/13/2015    Radiographic Findings: [place] Extrem Up Left Ltd  Result Date: 03/28/2016 CLINICAL DATA:  Patient status post recent left breast lumpectomy. Physician detected palpable abnormality within the left axilla. EXAM: 2D  DIGITAL DIAGNOSTIC LEFT MAMMOGRAM WITH ADJUNCT TOMO ULTRASOUND LEFT BREAST COMPARISON:  Previous exam(s). ACR Breast Density Category b: There are scattered areas of fibroglandular density. FINDINGS: Interval postlumpectomy changes left breast. No concerning masses, calcifications or nonsurgical architectural distortion identified within the left breast. On physical exam, I palpate a small mass with left axilla. Targeted ultrasound is performed, showing a 1.2 x 0.9 x 1.6 cm simple appearing cystic lesion within the left axilla at the site of palpable  abnormality, likely representing a small seroma. IMPRESSION: Palpable abnormality within the left axilla corresponds with a probable small seroma. No mammographic evidence for malignancy. RECOMMENDATION: Bilateral diagnostic mammography 07/2016. I have discussed the findings and recommendations with the patient. Results were also provided in writing at the conclusion of the visit. If applicable, a reminder letter will be sent to the patient regarding the next appointment. BI-RADS CATEGORY  2: Benign. Electronically Signed   By: Lovey Newcomer M.D.   On: 03/28/2016 15:59   Mm Diag Breast Tomo Uni Left  Result Date: 03/28/2016 CLINICAL DATA:  Patient status post recent left breast lumpectomy. Physician detected palpable abnormality within the left axilla. EXAM: 2D DIGITAL DIAGNOSTIC LEFT MAMMOGRAM WITH ADJUNCT TOMO ULTRASOUND LEFT BREAST COMPARISON:  Previous exam(s). ACR Breast Density Category b: There are scattered areas of fibroglandular density. FINDINGS: Interval postlumpectomy changes left breast. No concerning masses, calcifications or nonsurgical architectural distortion identified within the left breast. On physical exam, I palpate a small mass with left axilla. Targeted ultrasound is performed, showing a 1.2 x 0.9 x 1.6 cm simple appearing cystic lesion within the left axilla at the site of palpable abnormality, likely representing a small seroma. IMPRESSION: Palpable abnormality within the left axilla corresponds with a probable small seroma. No mammographic evidence for malignancy. RECOMMENDATION: Bilateral diagnostic mammography 07/2016. I have discussed the findings and recommendations with the patient. Results were also provided in writing at the conclusion of the visit. If applicable, a reminder letter will be sent to the patient regarding the next appointment. BI-RADS CATEGORY  2: Benign. Electronically Signed   By: Lovey Newcomer M.D.   On: 03/28/2016 15:59    Impression: No evidence of recurrence  on clinical exam today.  Plan: In light of her close follow up with surgery and medical oncology, she is not scheduled for formal follow up with radiation oncology. I would be glad to see her on a PRN basis. The patient is scheduled for to follow up with [name] on 06/07/16 and will continue close follow up with him with hormonal therapy. ____________________________________ -----------------------------------  Blair Promise, PhD, MD  This document serves as a record of services personally performed by [name], MD. It was created on his behalf by Darcus Austin, a trained medical scribe. The creation of this record is based on the scribe's personal observations and the provider's statements to them. This document has been checked and approved by the attending provider.

## 2016-04-29 ENCOUNTER — Other Ambulatory Visit: Payer: Self-pay | Admitting: Cardiovascular Disease

## 2016-05-15 DIAGNOSIS — I739 Peripheral vascular disease, unspecified: Secondary | ICD-10-CM | POA: Diagnosis not present

## 2016-05-15 DIAGNOSIS — N183 Chronic kidney disease, stage 3 (moderate): Secondary | ICD-10-CM | POA: Diagnosis not present

## 2016-05-15 DIAGNOSIS — N390 Urinary tract infection, site not specified: Secondary | ICD-10-CM | POA: Diagnosis not present

## 2016-05-15 DIAGNOSIS — E782 Mixed hyperlipidemia: Secondary | ICD-10-CM | POA: Diagnosis not present

## 2016-05-15 DIAGNOSIS — I10 Essential (primary) hypertension: Secondary | ICD-10-CM | POA: Diagnosis not present

## 2016-05-21 DIAGNOSIS — E782 Mixed hyperlipidemia: Secondary | ICD-10-CM | POA: Diagnosis not present

## 2016-05-21 DIAGNOSIS — I10 Essential (primary) hypertension: Secondary | ICD-10-CM | POA: Diagnosis not present

## 2016-05-21 DIAGNOSIS — N183 Chronic kidney disease, stage 3 (moderate): Secondary | ICD-10-CM | POA: Diagnosis not present

## 2016-05-21 DIAGNOSIS — Z23 Encounter for immunization: Secondary | ICD-10-CM | POA: Diagnosis not present

## 2016-05-21 DIAGNOSIS — I739 Peripheral vascular disease, unspecified: Secondary | ICD-10-CM | POA: Diagnosis not present

## 2016-06-07 ENCOUNTER — Ambulatory Visit (HOSPITAL_BASED_OUTPATIENT_CLINIC_OR_DEPARTMENT_OTHER): Payer: Medicare Other | Admitting: Hematology and Oncology

## 2016-06-07 ENCOUNTER — Encounter: Payer: Self-pay | Admitting: Hematology and Oncology

## 2016-06-07 VITALS — BP 91/61 | HR 98 | Temp 98.3°F | Resp 16 | Ht 60.0 in | Wt 88.2 lb

## 2016-06-07 DIAGNOSIS — C50412 Malignant neoplasm of upper-outer quadrant of left female breast: Secondary | ICD-10-CM | POA: Diagnosis not present

## 2016-06-07 DIAGNOSIS — I890 Lymphedema, not elsewhere classified: Secondary | ICD-10-CM

## 2016-06-07 DIAGNOSIS — I701 Atherosclerosis of renal artery: Secondary | ICD-10-CM | POA: Diagnosis not present

## 2016-06-07 DIAGNOSIS — Z170 Estrogen receptor positive status [ER+]: Secondary | ICD-10-CM

## 2016-06-07 NOTE — Progress Notes (Signed)
Patient Care Team: Merrilee Seashore, MD as PCP - General (Internal Medicine) Lorretta Harp, MD as Attending Physician (Cardiology) Milus Banister, MD (Gastroenterology) Molli Posey, MD as Consulting Physician (Obstetrics and Gynecology) Sylvan Cheese, NP as Nurse Practitioner (Hematology and Oncology)  DIAGNOSIS:  Encounter Diagnosis  Name Primary?  . Malignant neoplasm of upper-outer quadrant of left breast in female, estrogen receptor positive ([place])     SUMMARY OF ONCOLOGIC HISTORY:   Breast cancer of upper-outer quadrant of left female breast ([place])   08/30/2015 Imaging    DEXA scan: Normal       09/07/2015 Initial Diagnosis    Screening detected left breast mass at 1:00 position 4 x 3 x 4 mm biopsy grade 1 invasive ductal carcinoma ER 100% PR 90% positive HER-2 negative ratio 1.18 Ki-67 10%, T1 aN0 stage IA clinical stage      09/22/2015 Surgery    Left lumpectomy [name]): IDC grade 1, 1.1 minutes, ADH, LCIS, margins negative, 0/3 lymph nodes, ER 100%, PR 90%, HER-2 negative, Ki-67 10%, T1 cN0 stage IA, Oncotype DX score 14, 9% ROR      10/30/2015 - 11/28/2015 Radiation Therapy    Adjuvant XRT ([place]). Left breast: 42.72 Gy in 16 treatments.  Left breast "boost": 10 Gy in 5 treatments.  Total: 52.72 Gy in 21 treatments       11/2015 -  Anti-estrogen oral therapy    Tamoxifen 75m daily. Planned duration of treatment: 5 years ()       CHIEF COMPLIANT: Follow-up on tamoxifen therapy, complaining of fullness in the left axilla  INTERVAL HISTORY: Kendra ENNISis a 70year old with above-mentioned history left breast cancer treated with lumpectomy and adjuvant radiation is currently on tamoxifen since May 2017. She is tolerating tamoxifen extremely well. She has very occasional hot flashes. She denies any myalgias. Complaining of fullness in the left breast as well as the left anterior axillary area. It's very uncomfortable and worrisome for  her.  REVIEW OF SYSTEMS:   Constitutional: Denies fevers, chills or abnormal weight loss Eyes: Denies blurriness of vision Ears, nose, mouth, throat, and face: Denies mucositis or sore throat Respiratory: Denies cough, dyspnea or wheezes Cardiovascular: Denies palpitation, chest discomfort Gastrointestinal:  Denies nausea, heartburn or change in bowel habits Skin: Denies abnormal skin rashes Lymphatics: Denies new lymphadenopathy or easy bruising Neurological:Denies numbness, tingling or new weaknesses Behavioral/Psych: Mood is stable, no new changes  Extremities: No lower extremity edema Breast: Full less in the left axilla and breast All other systems were reviewed with the patient and are negative.  I have reviewed the past medical history, past surgical history, social history and family history with the patient and they are unchanged from previous note.  ALLERGIES:  has No Known Allergies.  MEDICATIONS:  Current Outpatient Prescriptions  Medication Sig Dispense Refill  . amLODipine (NORVASC) 10 MG tablet Take 10 mg by mouth every morning.    .Marland Kitchenaspirin 81 MG tablet Take 162 mg by mouth daily.    . calcium citrate (CALCITRATE - DOSED IN MG ELEMENTAL CALCIUM) 950 MG tablet Take 1,200 mg of elemental calcium by mouth daily.    . clopidogrel (PLAVIX) 75 MG tablet Take 1 tablet (75 mg total) by mouth daily. 90 tablet 1  . clopidogrel (PLAVIX) 75 MG tablet TAKE 1 TABLET BY MOUTH  DAILY 90 tablet 0  . doxazosin (CARDURA) 8 MG tablet Take 8 mg by mouth every morning.      .Marland Kitchenlevothyroxine (SYNTHROID,  LEVOTHROID) 88 MCG tablet Take 88 mcg by mouth daily before breakfast.    . losartan (COZAAR) 100 MG tablet Take 100 mg by mouth every morning.     . Multiple Vitamins-Minerals (MULTIVITAMIN WITH MINERALS) tablet Take 1 tablet by mouth every morning.     . rosuvastatin (CRESTOR) 10 MG tablet Take 10 mg by mouth every morning.     . tamoxifen (NOLVADEX) 20 MG tablet Take 1 tablet (20 mg  total) by mouth daily. 90 tablet 3   No current facility-administered medications for this visit.     PHYSICAL EXAMINATION: ECOG PERFORMANCE STATUS: 1 - Symptomatic but completely ambulatory  Vitals:   06/07/16 1122  BP: 91/61  Pulse: 98  Resp: 16  Temp: 98.3 F (36.8 C)   Filed Weights   06/07/16 1122  Weight: 88 lb 3.2 oz (40 kg)    GENERAL:alert, no distress and comfortable SKIN: skin color, texture, turgor are normal, no rashes or significant lesions EYES: normal, Conjunctiva are pink and non-injected, sclera clear OROPHARYNX:no exudate, no erythema and lips, buccal mucosa, and tongue normal  NECK: supple, thyroid normal size, non-tender, without nodularity LYMPH:  no palpable lymphadenopathy in the cervical, axillary or inguinal LUNGS: clear to auscultation and percussion with normal breathing effort HEART: regular rate & rhythm and no murmurs and no lower extremity edema ABDOMEN:abdomen soft, non-tender and normal bowel sounds MUSCULOSKELETAL:no cyanosis of digits and no clubbing  NEURO: alert & oriented x 3 with fluent speech, no focal motor/sensory deficits EXTREMITIES: No lower extremity edema BREAST: Marked left breast lymphedema (exam performed in the presence of a chaperone)  LABORATORY DATA:  I have reviewed the data as listed   Chemistry      Component Value Date/Time   NA 139 09/13/2015 1224   K 4.1 09/13/2015 1224   CL 107 08/23/2014 0419   CO2 22 09/13/2015 1224   BUN 14.8 09/13/2015 1224   CREATININE 1.0 09/13/2015 1224      Component Value Date/Time   CALCIUM 9.8 09/13/2015 1224   ALKPHOS 65 09/13/2015 1224   AST 19 09/13/2015 1224   ALT 16 09/13/2015 1224   BILITOT 0.37 09/13/2015 1224       Lab Results  Component Value Date   WBC 7.4 09/13/2015   HGB 14.4 09/13/2015   HCT 43.9 09/13/2015   MCV 96.3 09/13/2015   PLT 212 09/13/2015   NEUTROABS 5.6 09/13/2015     ASSESSMENT & PLAN:  Breast cancer of upper-outer quadrant of left  female breast ([place]) Left lumpectomy 09/22/2015: IDC grade 1, 1.1 cm, ADH, LCIS, margins negative, 0/3 lymph nodes, ER 100%, PR 90%, HER-2 negative, Ki-67 10%, T1 cN0 stage IA, Oncotype DX score 14, 9% risk of recurrence Adjuvant radiation therapy 10/30/2015 to 11/28/2015  Treatment plan: Adjuvant antiestrogen therapy with tamoxifen 20 mg by mouth daily 5 years (hosen due to osteoporosis T score of -2.5 done in February 2017)  Tamoxifen Toxicities: 1. Very occasional hot flashes  Fullness in the left axilla and breast: We will obtain a mammogram and ultrasound for further evaluation. Clinical exam reveals severe lymphedema changes in the left breast. I would like to further evaluate with mammogram and ultrasound. If they're normal, we will refer to physical therapy.  RTC in 6 months for follow up.   No orders of the defined types were placed in this encounter.  The patient has a good understanding of the overall plan. she agrees with it. she will call with any problems that may  develop before the next visit here.   Rulon Eisenmenger, MD 06/07/16

## 2016-06-07 NOTE — Assessment & Plan Note (Signed)
Left lumpectomy 09/22/2015: IDC grade 1, 1.1 cm, ADH, LCIS, margins negative, 0/3 lymph nodes, ER 100%, PR 90%, HER-2 negative, Ki-67 10%, T1 cN0 stage IA, Oncotype DX score 14, 9% risk of recurrence Adjuvant radiation therapy 10/30/2015 to 11/28/2015  Treatment plan: Adjuvant antiestrogen therapy with tamoxifen 20 mg by mouth daily 5 years (hosen due to osteoporosis T score of -2.5 done in February 2017)  Tamoxifen Toxicities:  RTC in 6 months for follow up.

## 2016-06-11 ENCOUNTER — Ambulatory Visit: Payer: Medicare Other | Admitting: Physical Therapy

## 2016-06-11 ENCOUNTER — Other Ambulatory Visit: Payer: Self-pay

## 2016-06-11 DIAGNOSIS — R591 Generalized enlarged lymph nodes: Secondary | ICD-10-CM

## 2016-06-12 ENCOUNTER — Other Ambulatory Visit: Payer: Self-pay | Admitting: Cardiovascular Disease

## 2016-06-12 DIAGNOSIS — I701 Atherosclerosis of renal artery: Secondary | ICD-10-CM

## 2016-06-17 ENCOUNTER — Ambulatory Visit: Payer: Medicare Other | Attending: Hematology and Oncology | Admitting: Physical Therapy

## 2016-06-17 ENCOUNTER — Encounter: Payer: Self-pay | Admitting: Physical Therapy

## 2016-06-17 DIAGNOSIS — I890 Lymphedema, not elsewhere classified: Secondary | ICD-10-CM | POA: Insufficient documentation

## 2016-06-17 DIAGNOSIS — R293 Abnormal posture: Secondary | ICD-10-CM | POA: Insufficient documentation

## 2016-06-17 NOTE — Therapy (Signed)
[place], [place], [address] Phone: [phone]   Fax:  [phone]  Physical Therapy Evaluation  Patient Details  Name: Kendra Watts MRN: [id number] Date of Birth: 1945-12-22 Referring Provider: [name]  Encounter Date: 06/17/2016      PT End of Session - 06/17/16 1523    Visit Number 1   Number of Visits 13   Date for PT Re-Evaluation 07/15/16   PT Start Time 1351   PT Stop Time 1428   PT Time Calculation (min) 37 min   Activity Tolerance Patient tolerated treatment well   Behavior During Therapy [facility] for tasks assessed/performed      Past Medical History:  Diagnosis Date  . 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 Breast cancer of upper-outer quadrant of left female breast ([place]) 09/08/2015  . Cataract, immature   . Heart murmur    states no known problems  . History of hyperthyroidism   . Hyperlipidemia   . Hypertension    states under control with meds., has been on med. x "long time"  . Hypothyroidism   . Nonfunctioning kidney    left  . Radiation 10/30/15-11/28/15   left breast 42.72 Gy, boosted to 10 Gy  . Renal artery stenosis ([place])   . Tobacco abuse   . Wears partial dentures    upper and lower    Past Surgical History:  Procedure Laterality Date  . ABDOMINAL ANGIOGRAM  02/18/2012   Procedure: ABDOMINAL ANGIOGRAM;  Surgeon: Lorretta Harp, MD;  Location[facility] CATH LAB;  Service: Cardiovascular;;  . ABDOMINAL AORTAGRAM  07/04/2014  . ABDOMINAL HYSTERECTOMY  ~ 1977   partial  . APPENDECTOMY    . ARCH AORTOGRAM    . CAROTID ANGIOGRAM N/A 02/18/2012   Procedure: CAROTID ANGIOGRAM;  Surgeon: Lorretta Harp, MD;  Location: [facility] CATH LAB;  Service: Cardiovascular;  Laterality: N/A;  . ENDARTERECTOMY  04/02/2012   Procedure: ENDARTERECTOMY CAROTID;  Surgeon: Serafina Mitchell, MD;  Location: [facility] OR;  Service: Vascular;  Laterality: Right;  . RADIOACTIVE SEED GUIDED MASTECTOMY WITH  AXILLARY SENTINEL LYMPH NODE BIOPSY Left 09/22/2015   Procedure: INJECT BLUE DYE LEFT BREAST,RADIOACTIVE SEED GUIDED PARTIAL MASTECTOMY WITH AXILLARY SENTINEL LYMPH NODE BIOPSY;  Surgeon: Fanny Skates, MD;  Location: [facility];  Service: General;  Laterality: Left;  . RENAL ANGIOGRAM Left 06/08/2010   renal artery stent -  5x12 Genesis on Aviator balloon stent (Dr. Adora Fridge)  . RENAL ANGIOGRAM Right 07/04/2014   Procedure: RENAL ANGIOGRAM;  Surgeon: Lorretta Harp, MD;  Location: [facility] CATH LAB;  Service: Cardiovascular;  Laterality: Right;  . RENAL ANGIOGRAM Right 08/22/2014   Procedure: RENAL ANGIOGRAM;  Surgeon: Lorretta Harp, MD;  Location: [facility] CATH LAB;  Service: Cardiovascular;  Laterality: Right;  . TONSILLECTOMY     as a child    There were no vitals filed for this visit.       Subjective Assessment - 06/17/16 1356    Subjective I have more swelling when I get up in the morning in my left armpit and breast. I does not hurt all the time. Sometimes I have shooting pains and it feels more swollen when I get up in the morning.    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  It is ER/PR positive and HER2 negative., pt underwent left lumpectomy  with sentinel lymph node biopsy, pt completed radiation   Patient Stated Goals to get the swelling under control   Currently in Pain? No/denies            [facility] PT Assessment - 06/17/16 0001      Assessment   Medical Diagnosis Left breast cancer   Referring Provider [place]   Onset Date/Surgical Date 09/22/15   Hand Dominance Left   Prior Therapy none     Precautions   Precautions Other (comment)   Precaution Comments at risk for lymphedema     Restrictions   Weight Bearing Restrictions No     Balance Screen   Has the patient fallen in the past 6 months No   Has the patient had a decrease in activity level  because of a fear of falling?  No   Is the patient reluctant to leave their home because of a fear of falling?  No     Home Social worker Private residence   Living Arrangements Alone   Available Help at Discharge Friend(s)   Type of [place] Access Level entry   [place] One level   [place] None     Prior Function   Level of [place] Retired   Leisure She does not exercise     Cognition   Overall Cognitive Status Within Functional Limits for tasks assessed     Observation/Other Assessments   Other Surveys  --  LLIS: 13% impairment     Posture/Postural Control   Posture/Postural Control Postural limitations   Postural Limitations Rounded Shoulders;Forward head;Increased thoracic kyphosis     ROM / Strength   AROM / PROM / Strength AROM;Strength     AROM   Right Shoulder Extension --   Right Shoulder Flexion 150 Degrees   Right Shoulder ABduction 180 Degrees   Right Shoulder Internal Rotation 53 Degrees   Right Shoulder External Rotation 83 Degrees   Left Shoulder Extension --   Left Shoulder Flexion 161 Degrees   Left Shoulder ABduction 165 Degrees   Left Shoulder Internal Rotation 49 Degrees   Left Shoulder External Rotation 90 Degrees     Strength   Overall Strength Within functional limits for tasks performed           LYMPHEDEMA/ONCOLOGY QUESTIONNAIRE - 06/17/16 1536      Type   Cancer Type (P)  left breast cancer     Surgeries   Lumpectomy Date (P)  09/22/15   Sentinel Lymph Node Biopsy Date (P)  09/22/15   Number Lymph Nodes Removed (P)  3     Treatment   Active Chemotherapy Treatment (P)  No   Past Chemotherapy Treatment (P)  No   Active Radiation Treatment (P)  No   Past Radiation Treatment (P)  Yes   Date (P)  11/28/15   Body Site (P)  left breast   Current Hormone Treatment (P)  Yes   Drug Name (P)  Tamoxifen   Past Hormone Therapy (P)  No     What other symptoms do  you have   Are you Having Heaviness or Tightness (P)  Yes   Are you having Pain (P)  Yes   Are you having pitting edema (P)  No   Is it Hard or Difficult finding clothes that fit (P)  No   Do you have infections (P)  No     Lymphedema Assessments   Lymphedema Assessments (  P)  Upper extremities     Right Upper Extremity Lymphedema   10 cm Proximal to Olecranon Process (P)  17 cm   Olecranon Process (P)  18.5 cm   10 cm Proximal to Ulnar Styloid Process (P)  14.5 cm   Just Proximal to Ulnar Styloid Process (P)  12.5 cm   Across Hand at [employer] (P)  15.8 cm   At [facility] of 2nd Digit (P)  5.4 cm     Left Upper Extremity Lymphedema   10 cm Proximal to Olecranon Process (P)  18.4 cm   Olecranon Process (P)  18.3 cm   10 cm Proximal to Ulnar Styloid Process (P)  16.4 cm   Just Proximal to Ulnar Styloid Process (P)  12.9 cm   Across Hand at [employer] (P)  16.5 cm   At [place] of 2nd Digit (P)  5.2 cm                        PT Education - 06/17/16 1534    Education provided Yes   Education Details anatomy and physiology of lymphatic system, education regarding lymphedema and management   Person(s) Educated Patient   Methods Explanation   Comprehension Verbalized understanding            [facility] Goals - 06/17/16 1529      CC Long Term Goal  #1   Title Pt will be independent in self manual lymphatic drainage for long term management of left breast edema   Time 4   Period Weeks   Status New     CC Long Term Goal  #2   Title Pt will receive appropriate compression garment for long term management of left breast edema   Time 4   Period Weeks   Status New     CC Long Term Goal  #3   Title Pt will report a 50% improvement in left breast lymphedema to decrease risk of infection   Time 4   Period Weeks   Status New     CC Long Term Goal  #4   Title Pt will demonstrate 60 degrees of left shoulder internal rotation to allow her to return to  her prior level of function   Baseline 49   Time 4   Period Weeks   Status New            Plan - 06/17/16 1524    Clinical Impression Statement Pt presents to PT with left breast lymphedema following a left lumpectomy with sentinel lymph node biopsy and completion of radiation. Her left breast is approximately 35-45% fuller than her right breast. She has some fibrosis in her inferior breast and increased skin redness. She also has fullness in left chest area above breast. Her ROM is WFL except for her IR which is limited bilaterally. There is no evidence of lymphedema in her left UE. This evaluation was of low complexitity due to the lack of co morbidities except for prior radiation and her stable clinical presentation.    Rehab Potential Good   Clinical Impairments Affecting Rehab Potential hx of radiation   PT Frequency 3x / week   PT Duration 4 weeks   PT Treatment/Interventions ADLs/Self Care Home Management;Therapeutic exercise;Orthotic Fit/Training;Manual techniques;Manual lymph drainage;Patient/family education;Scar mobilization;Passive range of motion;Taping   PT Next Visit Plan begin MLD to left breast, give pt info to obtain a compression bra   Consulted and Agree  with Plan of Care Patient      Patient will benefit from skilled therapeutic intervention in order to improve the following deficits and impairments:  Increased edema, Decreased range of motion, Decreased strength, Decreased scar mobility, Pain  Visit Diagnosis: Lymphedema, not elsewhere classified - Plan: PT plan of care cert/re-cert  Abnormal posture - Plan: PT plan of care cert/re-cert      G-Codes - 63/33/54 1532    Functional Assessment Tool Used LLIS   Functional Limitation Other PT primary   Other PT Primary Current Status (T6256) At least 1 percent but less than 20 percent impaired, limited or restricted   Other PT Primary Goal Status (L8937) 0 percent impaired, limited or restricted       Problem  List Patient Active Problem List   Diagnosis Date Noted  . Breast cancer of upper-outer quadrant of left female breast ([place]) 09/08/2015  . Renal artery stenosis ([place]) 08/23/2014  . Right upper extremity numbness 07/01/2013  . Atherosclerotic renal artery stenosis, bilateral ([place]) 05/26/2013  . Peripheral arterial disease ([place]) 05/26/2013  . Essential hypertension 05/26/2013  . Hyperlipidemia 05/26/2013  . Tobacco abuse 05/26/2013  . Occlusion and stenosis of carotid artery without mention of cerebral infarction 03/09/2012    Alexia Freestone 06/17/2016, 3:37 PM  [place], [place], [address] Phone: [phone]   Fax:  [phone]  Name: Kendra Watts MRN: [id number] Date of Birth: 11-18-1945  Allyson Sabal, PT 06/17/16 3:38 PM

## 2016-06-19 ENCOUNTER — Telehealth: Payer: Self-pay | Admitting: Cardiovascular Disease

## 2016-06-19 ENCOUNTER — Encounter (HOSPITAL_COMMUNITY): Payer: Medicare Other

## 2016-06-19 NOTE — Telephone Encounter (Signed)
Pt is having a Renal Doppler tomorrow morning at 11:00.. She wants to know if she can have a cup of black coffee in the morning?

## 2016-06-19 NOTE — Telephone Encounter (Signed)
Spoke to [employer], confirmed that this is OK per protocol to have black coffee (preferred black vs w/ cream) or water, prior to imaging, can take morning meds w beverage. Pt states she had this done before but could not recall specific protocol. Voiced understanding and thanks for clarification of information.

## 2016-06-20 ENCOUNTER — Ambulatory Visit (HOSPITAL_COMMUNITY)
Admission: RE | Admit: 2016-06-20 | Discharge: 2016-06-20 | Disposition: A | Discharge status: Home / Self Care | Payer: Medicare Other | Source: Ambulatory Visit | Attending: Cardiology | Admitting: Cardiology

## 2016-06-20 ENCOUNTER — Telehealth: Payer: Self-pay

## 2016-06-20 DIAGNOSIS — E785 Hyperlipidemia, unspecified: Secondary | ICD-10-CM | POA: Insufficient documentation

## 2016-06-20 DIAGNOSIS — I10 Essential (primary) hypertension: Secondary | ICD-10-CM | POA: Insufficient documentation

## 2016-06-20 DIAGNOSIS — I708 Atherosclerosis of other arteries: Secondary | ICD-10-CM | POA: Insufficient documentation

## 2016-06-20 DIAGNOSIS — I739 Peripheral vascular disease, unspecified: Secondary | ICD-10-CM | POA: Insufficient documentation

## 2016-06-20 DIAGNOSIS — I700 Atherosclerosis of aorta: Secondary | ICD-10-CM | POA: Diagnosis not present

## 2016-06-20 DIAGNOSIS — I701 Atherosclerosis of renal artery: Secondary | ICD-10-CM

## 2016-06-20 DIAGNOSIS — Z720 Tobacco use: Secondary | ICD-10-CM | POA: Insufficient documentation

## 2016-06-20 NOTE — Telephone Encounter (Signed)
Called pt to return vm regarding questions about her [place] and mammogram at the breast center. Pt states that she did not have a recent order placed. Told pt that I called and spoke with someone at the [facility] to confirm that there was an order placed for [place] and mammogram of the left breast. Notified the pt that she may call tomorrow to schedule those appt. Pt appreciative and has no further concerns at this time.

## 2016-06-21 ENCOUNTER — Other Ambulatory Visit: Payer: Self-pay | Admitting: Cardiovascular Disease

## 2016-06-21 DIAGNOSIS — I701 Atherosclerosis of renal artery: Secondary | ICD-10-CM

## 2016-06-25 ENCOUNTER — Encounter: Payer: Self-pay | Admitting: Physical Therapy

## 2016-06-25 ENCOUNTER — Ambulatory Visit: Payer: Medicare Other | Attending: Hematology and Oncology | Admitting: Physical Therapy

## 2016-06-25 DIAGNOSIS — R293 Abnormal posture: Secondary | ICD-10-CM | POA: Insufficient documentation

## 2016-06-25 DIAGNOSIS — I890 Lymphedema, not elsewhere classified: Secondary | ICD-10-CM

## 2016-06-25 NOTE — Therapy (Signed)
[place], [place], [address] Phone: [phone]   Fax:  [phone]  Physical Therapy Treatment  Patient Details  Name: Kendra Watts MRN: [id number] Date of Birth: 06-04-1946 Referring Provider: [name]  Encounter Date: 06/25/2016      PT End of Session - 06/25/16 1541    Visit Number 2   Number of Visits 13   Date for PT Re-Evaluation 07/15/16   PT Start Time 5916   PT Stop Time 1520   PT Time Calculation (min) 47 min   Activity Tolerance Patient tolerated treatment well   Behavior During Therapy [facility] for tasks assessed/performed      Past Medical History:  Diagnosis Date  . 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 Breast cancer of upper-outer quadrant of left female breast ([place]) 09/08/2015  . Cataract, immature   . Heart murmur    states no known problems  . History of hyperthyroidism   . Hyperlipidemia   . Hypertension    states under control with meds., has been on med. x "long time"  . Hypothyroidism   . Nonfunctioning kidney    left  . Radiation 10/30/15-11/28/15   left breast 42.72 Gy, boosted to 10 Gy  . Renal artery stenosis ([place])   . Tobacco abuse   . Wears partial dentures    upper and lower    Past Surgical History:  Procedure Laterality Date  . ABDOMINAL ANGIOGRAM  02/18/2012   Procedure: ABDOMINAL ANGIOGRAM;  Surgeon: Lorretta Harp, MD;  Location: [facility] CATH LAB;  Service: Cardiovascular;;  . ABDOMINAL AORTAGRAM  07/04/2014  . ABDOMINAL HYSTERECTOMY  ~ 1977   partial  . APPENDECTOMY    . ARCH AORTOGRAM    . CAROTID ANGIOGRAM N/A 02/18/2012   Procedure: CAROTID ANGIOGRAM;  Surgeon: Lorretta Harp, MD;  Location: [facility] C[facility] LAB;  Service: Cardiovascular;  Laterality: N/A;  . ENDARTERECTOMY  04/02/2012   Procedure: ENDARTERECTOMY CAROTID;  Surgeon: Serafina Mitchell, MD;  Location: [facility] OR;  Service: Vascular;  Laterality: Right;  . RADIOACTIVE SEED GUIDED MASTECTOMY WITH  AXILLARY SENTINEL LYMPH NODE BIOPSY Left 09/22/2015   Procedure: INJECT BLUE DYE LEFT BREAST,RADIOACTIVE SEED GUIDED PARTIAL MASTECTOMY WITH AXILLARY SENTINEL LYMPH NODE BIOPSY;  Surgeon: Fanny Skates, MD;  Location: [place];  Service: General;  Laterality: Left;  . RENAL ANGIOGRAM Left 06/08/2010   renal artery stent -  5x12 Genesis on Aviator balloon stent (Dr. Adora Fridge)  . RENAL ANGIOGRAM Right 07/04/2014   Procedure: RENAL ANGIOGRAM;  Surgeon: Lorretta Harp, MD;  Location: [facility] CATH LAB;  Service: Cardiovascular;  Laterality: Right;  . RENAL ANGIOGRAM Right 08/22/2014   Procedure: RENAL ANGIOGRAM;  Surgeon: Lorretta Harp, MD;  Location: [facility] CATH LAB;  Service: Cardiovascular;  Laterality: Right;  . TONSILLECTOMY     as a child    There were no vitals filed for this visit.      Subjective Assessment - 06/25/16 1437    Subjective My swelling is the same as everyday. It is uncomfortable off and on but more so in the mornings.    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  It is ER/PR positive and HER2 negative., pt underwent left lumpectomy with sentinel lymph node biopsy, pt completed radiation   Patient Stated Goals to get the swelling under control   Currently in  Pain? No/denies                         [facility] Adult PT Treatment/Exercise - 06/25/16 0001      Manual Therapy   Manual Therapy Manual Lymphatic Drainage (MLD);Edema management   Edema Management educated pt about different types of compression bras available and gave pt info to obtain, made chip bag for pt to wear in her bra for additional compression   Manual Lymphatic Drainage (MLD) short neck, superficial and deep abdominals, right axillary nodes and establishment of inter axillary pathway, left inguinal nodes and establisment of axillo inguinal pathway, left breast moving fluid  towards pathways, reestablishment of pathways educating pt throughout                      Long Term Clinic Goals - 06/17/16 1529      CC Long Term Goal  #1   Title Pt will be independent in self manual lymphatic drainage for long term management of left breast edema   Time 4   Period Weeks   Status New     CC Long Term Goal  #2   Title Pt will receive appropriate compression garment for long term management of left breast edema   Time 4   Period Weeks   Status New     CC Long Term Goal  #3   Title Pt will report a 50% improvement in left breast lymphedema to decrease risk of infection   Time 4   Period Weeks   Status New     CC Long Term Goal  #4   Title Pt will demonstrate 60 degrees of left shoulder internal rotation to allow her to return to her prior level of function   Baseline 49   Time 4   Period Weeks   Status New            Plan - 06/25/16 1543    Clinical Impression Statement Began MLD to left breast today and instructed pt throughout. Educated pt about different types of compression bras and gave pt information to obtain one. She was going to make an appointment today. Also created a chip bag for pt to wear in her bra and to sleep with to help reduce left breast swelling.    Rehab Potential Good   Clinical Impairments Affecting Rehab Potential hx of radiation   PT Frequency 3x / week   PT Duration 4 weeks   PT Treatment/Interventions ADLs/Self Care Home Management;Therapeutic exercise;Orthotic Fit/Training;Manual techniques;Manual lymph drainage;Patient/family education;Scar mobilization;Passive range of motion;Taping   PT Next Visit Plan continue instruction in self MLD, ask if pt obtained compression bra   Consulted and Agree with Plan of Care Patient      Patient will benefit from skilled therapeutic intervention in order to improve the following deficits and impairments:  Increased edema, Decreased range of motion, Decreased strength,  Decreased scar mobility, Pain  Visit Diagnosis: Lymphedema, not elsewhere classified     Problem List Patient Active Problem List   Diagnosis Date Noted  . Breast cancer of upper-outer quadrant of left female breast ([facility]) 09/08/2015  . Renal artery stenosis ([facility]) 08/23/2014  . Right upper extremity numbness 07/01/2013  . Atherosclerotic renal artery stenosis, bilateral ([facility]) 05/26/2013  . Peripheral arterial disease ([facility]) 05/26/2013  . Essential hypertension 05/26/2013  . Hyperlipidemia 05/26/2013  . Tobacco abuse 05/26/2013  . Occlusion and stenosis of carotid artery without mention of cerebral  infarction 03/09/2012    Alexia Freestone 06/25/2016, 3:46 PM  [place], [place], [address] Phone: [phone]   Fax:  [phone]  Name: SHAMIA UPPAL MRN: [id number] Date of Birth: [birth year]  Allyson Sabal, PT 06/25/16 3:46 PM

## 2016-06-26 ENCOUNTER — Other Ambulatory Visit: Payer: Medicare Other

## 2016-06-27 ENCOUNTER — Encounter: Payer: Medicare Other | Admitting: Physical Therapy

## 2016-06-28 ENCOUNTER — Ambulatory Visit: Payer: Medicare Other | Admitting: Physical Therapy

## 2016-06-28 ENCOUNTER — Ambulatory Visit: Payer: Medicare Other

## 2016-06-28 ENCOUNTER — Encounter: Payer: Self-pay | Admitting: Physical Therapy

## 2016-06-28 DIAGNOSIS — I890 Lymphedema, not elsewhere classified: Secondary | ICD-10-CM

## 2016-06-28 DIAGNOSIS — R293 Abnormal posture: Secondary | ICD-10-CM | POA: Diagnosis not present

## 2016-06-28 NOTE — Therapy (Signed)
[place], [place], [address] Phone: [phone]   Fax:  [phone]  Physical Therapy Treatment  Patient Details  Name: Kendra Watts MRN: [id number] Date of Birth: 03-12-1946 Referring Provider: [name]  Encounter Date: 06/28/2016      PT End of Session - 06/28/16 1112    Visit Number 3   Number of Visits 13   Date for PT Re-Evaluation 07/15/16   PT Start Time 0920   PT Stop Time 1005   PT Time Calculation (min) 45 min   Activity Tolerance Patient tolerated treatment well   Behavior During Therapy [facility] for tasks assessed/performed      Past Medical History:  Diagnosis Date  . 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 Breast cancer of upper-outer quadrant of left female breast ([place]) 09/08/2015  . Cataract, immature   . Heart murmur    states no known problems  . History of hyperthyroidism   . Hyperlipidemia   . Hypertension    states under control with meds., has been on med. x "long time"  . Hypothyroidism   . Nonfunctioning kidney    left  . Radiation 10/30/15-11/28/15   left breast 42.72 Gy, boosted to 10 Gy  . Renal artery stenosis ([place])   . Tobacco abuse   . Wears partial dentures    upper and lower    Past Surgical History:  Procedure Laterality Date  . ABDOMINAL ANGIOGRAM  02/18/2012   Procedure: ABDOMINAL ANGIOGRAM;  Surgeon: Lorretta Harp, MD;  Location[facility] CATH LAB;  Service: Cardiovascular;;  . ABDOMINAL AORTAGRAM  07/04/2014  . ABDOMINAL HYSTERECTOMY  ~ 1977   partial  . APPENDECTOMY    . ARCH AORTOGRAM    . CAROTID ANGIOGRAM N/A 02/18/2012   Procedure: CAROTID ANGIOGRAM;  Surgeon: Lorretta Harp, MD;  [facility]: [facility] CATH LAB;  Service: Cardiovascular;  Laterality: N/A;  . ENDARTERECTOMY  04/02/2012   Procedure: ENDARTERECTOMY CAROTID;  Surgeon: Serafina Mitchell, MD;  Location: [facility] OR;  Service: Vascular;  Laterality: Right;  . RADIOACTIVE SEED GUIDED MASTECTOMY WITH  AXILLARY SENTINEL LYMPH NODE BIOPSY Left 09/22/2015   Procedure: INJECT BLUE DYE LEFT BREAST,RADIOACTIVE SEED GUIDED PARTIAL MASTECTOMY WITH AXILLARY SENTINEL LYMPH NODE BIOPSY;  Surgeon: Fanny Skates, MD;  Location: [place];  Service: General;  Laterality: Left;  . RENAL ANGIOGRAM Left 06/08/2010   renal artery stent -  5x12 Genesis on Aviator balloon stent (Dr. Adora Fridge)  . RENAL ANGIOGRAM Right 07/04/2014   Procedure: RENAL ANGIOGRAM;  Surgeon: Lorretta Harp, MD;  Location: [facility] CATH LAB;  Service: Cardiovascular;  Laterality: Right;  . RENAL ANGIOGRAM Right 08/22/2014   Procedure: RENAL ANGIOGRAM;  Surgeon: Lorretta Harp, MD;  Location: [facility] CATH LAB;  Service: Cardiovascular;  Laterality: Right;  . TONSILLECTOMY     as a child    There were no vitals filed for this visit.      Subjective Assessment - 06/28/16 0923    Subjective I called and will get measured for a compression bra on December 14th.    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  It is ER/PR positive and HER2 negative., pt underwent left lumpectomy with sentinel lymph node biopsy, pt completed radiation   Patient Stated Goals to get the swelling under control   Currently in Pain? No/denies  [facility] Adult PT Treatment/Exercise - 06/28/16 0001      Manual Therapy   Manual Therapy Manual Lymphatic Drainage (MLD);Edema management   Manual Lymphatic Drainage (MLD) short neck, superficial and deep abdominals, right axillary nodes and establishment of inter axillary pathway, left inguinal nodes and establisment of axillo inguinal pathway, left breast moving fluid towards pathways, reestablishment of pathways educating pt throughout                    [place] - 06/28/16 1111      CC Long Term Goal  #1   Title Pt will be independent in  self manual lymphatic drainage for long term management of left breast edema   Baseline 06/28/16- will begin instructing pt in self drainage at next visit   Time 4   Period Weeks   Status On-going     CC Long Term Goal  #2   Title Pt will receive appropriate compression garment for long term management of left breast edema   Baseline 06/28/16- pt has an appointment to be measured for a compression bra on 07/04/16   Time 4   Period Weeks   Status On-going     CC Long Term Goal  #3   Title Pt will report a 50% improvement in left breast lymphedema to decrease risk of infection   Time 4   Period Weeks   Status On-going     CC Long Term Goal  #4   Title Pt will demonstrate 60 degrees of left shoulder internal rotation to allow her to return to her prior level of function   Baseline 49   Time 4   Period Weeks   Status On-going            Plan - 06/28/16 1112    Clinical Impression Statement Patient has been wearing the foam chip pack in her bra and has been sleeping with this at night to help reduce edema. Patient felt some relief after last session. MLD performed today to left breast with some softening of fibrotic tissue noted at inferior breast.    Clinical Impairments Affecting Rehab Potential hx of radiation   PT Frequency 3x / week   PT Duration 4 weeks   PT Treatment/Interventions ADLs/Self Care Home Management;Therapeutic exercise;Orthotic Fit/Training;Manual techniques;Manual lymph drainage;Patient/family education;Scar mobilization;Passive range of motion;Taping   PT Next Visit Plan continue instruction in self MLD   PT Home Exercise Plan Shoulder ROM HEP   Consulted and Agree with Plan of Care Patient      Patient will benefit from skilled therapeutic intervention in order to improve the following deficits and impairments:  Increased edema, Decreased range of motion, Decreased strength, Decreased scar mobility, Pain  Visit Diagnosis: Lymphedema, not elsewhere  classified     Problem List Patient Active Problem List   Diagnosis Date Noted  . Breast cancer of upper-outer quadrant of left female breast ([place]) 09/08/2015  . Renal artery stenosis ([place]) 08/23/2014  . Right upper extremity numbness 07/01/2013  . Atherosclerotic renal artery stenosis, bilateral ([place]) 05/26/2013  . Peripheral arterial disease ([place]) 05/26/2013  . Essential hypertension 05/26/2013  . Hyperlipidemia 05/26/2013  . Tobacco abuse 05/26/2013  . Occlusion and stenosis of carotid artery without mention of cerebral infarction 03/09/2012    Alexia Freestone 06/28/2016, 11:15 AM  [place], [place], [address] Phone: [phone]   Fax:  [phone]  Name: Kendra Watts MRN: [id number] Date  of Birth: 03/23/1946   Allyson Sabal, PT 06/28/16 11:17 AM

## 2016-07-01 ENCOUNTER — Ambulatory Visit: Payer: Medicare Other | Admitting: Physical Therapy

## 2016-07-01 ENCOUNTER — Encounter: Payer: Self-pay | Admitting: Physical Therapy

## 2016-07-01 DIAGNOSIS — I890 Lymphedema, not elsewhere classified: Secondary | ICD-10-CM

## 2016-07-01 DIAGNOSIS — R293 Abnormal posture: Secondary | ICD-10-CM | POA: Diagnosis not present

## 2016-07-01 NOTE — Patient Instructions (Signed)

## 2016-07-01 NOTE — Therapy (Signed)
[place], [place], [address] Phone: [phone]   Fax:  [phone]  Physical Therapy Treatment  Patient Details  Name: Kendra Watts MRN: [id number] Date of Birth: Nov 27, 1945 Referring Provider: [name]  Encounter Date: 07/01/2016      PT End of Session - 07/01/16 1704    Visit Number 4   Number of Visits 13   Date for PT Re-Evaluation 07/15/16   PT Start Time 1350   PT Stop Time 1433   PT Time Calculation (min) 43 min   Activity Tolerance Patient tolerated treatment well   Behavior During Therapy [facility] for tasks assessed/performed      Past Medical History:  Diagnosis Date  . 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 Breast cancer of upper-outer quadrant of left female breast ([place]) 09/08/2015  . Cataract, immature   . Heart murmur    states no known problems  . History of hyperthyroidism   . Hyperlipidemia   . Hypertension    states under control with meds., has been on med. x "long time"  . Hypothyroidism   . Nonfunctioning kidney    left  . Radiation 10/30/15-11/28/15   left breast 42.72 Gy, boosted to 10 Gy  . Renal artery stenosis ([place])   . Tobacco abuse   . Wears partial dentures    upper and lower    Past Surgical History:  Procedure Laterality Date  . ABDOMINAL ANGIOGRAM  02/18/2012   Procedure: ABDOMINAL ANGIOGRAM;  Surgeon: Lorretta Harp, MD;  Location: [facility] CATH LAB;  Service: Cardiovascular;;  . ABDOMINAL AORTAGRAM  07/04/2014  . ABDOMINAL HYSTERECTOMY  ~ 1977   partial  . APPENDECTOMY    . ARCH AORTOGRAM    . CAROTID ANGIOGRAM N/A 02/18/2012   Procedure: CAROTID ANGIOGRAM;  Surgeon: Lorretta Harp, MD;  Location: [place] CATH LAB;  Service: Cardiovascular;  Laterality: N/A;  . ENDARTERECTOMY  04/02/2012   Procedure: ENDARTERECTOMY CAROTID;  Surgeon: Serafina Mitchell, MD;  Location: [facility] OR;  Service: Vascular;  Laterality: Right;  . RADIOACTIVE SEED GUIDED MASTECTOMY WITH  AXILLARY SENTINEL LYMPH NODE BIOPSY Left 09/22/2015   Procedure: INJECT BLUE DYE LEFT BREAST,RADIOACTIVE SEED GUIDED PARTIAL MASTECTOMY WITH AXILLARY SENTINEL LYMPH NODE BIOPSY;  Surgeon: Fanny Skates, MD;  Location: [place];  Service: General;  Laterality: Left;  . RENAL ANGIOGRAM Left 06/08/2010   renal artery stent -  5x12 Genesis on Aviator balloon stent (Dr. Adora Fridge)  . RENAL ANGIOGRAM Right 07/04/2014   Procedure: RENAL ANGIOGRAM;  Surgeon: Lorretta Harp, MD;  Location: [facility] CATH LAB;  Service: Cardiovascular;  Laterality: Right;  . RENAL ANGIOGRAM Right 08/22/2014   Procedure: RENAL ANGIOGRAM;  Surgeon: Lorretta Harp, MD;  Location: [facility] CATH LAB;  Service: Cardiovascular;  Laterality: Right;  . TONSILLECTOMY     as a child    There were no vitals filed for this visit.      Subjective Assessment - 07/01/16 1353    Subjective I didn't make it to my mammogram appointment on Friday because of the snow. I will go next Tuesday. I don't think my breast feels as tight. I can not tell that the chip pack is really helping.    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  It is ER/PR positive and HER2 negative., pt underwent left lumpectomy with sentinel lymph node  biopsy, pt completed radiation   Patient Stated Goals to get the swelling under control   Currently in Pain? No/denies                         [facility] Adult PT Treatment/Exercise - 07/01/16 0001      Manual Therapy   Manual Therapy Manual Lymphatic Drainage (MLD);Edema management   Manual Lymphatic Drainage (MLD) instructed pt throughout and gave her a handout, had pt demonstrate technique as follows: short neck, superficial and deep abdominals, right axillary nodes and establishment of inter axillary pathway, left inguinal nodes and establisment of axillo inguinal pathway, left breast moving  fluid towards pathways, reestablishment of pathways educating pt throughout                  [place] - 06/28/16 1111      CC Long Term Goal  #1   Title Pt will be independent in self manual lymphatic drainage for long term management of left breast edema   Baseline 06/28/16- will begin instructing pt in self drainage at next visit   Time 4   Period Weeks   Status On-going     CC Long Term Goal  #2   Title Pt will receive appropriate compression garment for long term management of left breast edema   Baseline 06/28/16- pt has an appointment to be measured for a compression bra on 07/04/16   Time 4   Period Weeks   Status On-going     CC Long Term Goal  #3   Title Pt will report a 50% improvement in left breast lymphedema to decrease risk of infection   Time 4   Period Weeks   Status On-going     CC Long Term Goal  #4   Title Pt will demonstrate 60 degrees of left shoulder internal rotation to allow her to return to her prior level of function   Baseline 49   Time 4   Period Weeks   Status On-going            Plan - 07/01/16 1704    Clinical Impression Statement Instructed pt in self drainage technique and had pt demonstrate technique back to therapist today. She required moderate cueing for correct technique and hand position. Therapist ended session with MLD to L breast with focus on fibrotic tissue in inferior left breast.    Rehab Potential Good   Clinical Impairments Affecting Rehab Potential hx of radiation   PT Frequency 3x / week   PT Duration 4 weeks   PT Treatment/Interventions ADLs/Self Care Home Management;Therapeutic exercise;Orthotic Fit/Training;Manual techniques;Manual lymph drainage;Patient/family education;Scar mobilization;Passive range of motion;Taping   PT Next Visit Plan continue instruction in self MLD   PT Home Exercise Plan Shoulder ROM HEP   Consulted and Agree with Plan of Care Patient      Patient will benefit from  skilled therapeutic intervention in order to improve the following deficits and impairments:  Increased edema, Decreased range of motion, Decreased strength, Decreased scar mobility, Pain  Visit Diagnosis: Lymphedema, not elsewhere classified     Problem List Patient Active Problem List   Diagnosis Date Noted  . Breast cancer of upper-outer quadrant of left female breast ([place]) 09/08/2015  . Renal artery stenosis ([place]) 08/23/2014  . Right upper extremity numbness 07/01/2013  . Atherosclerotic renal artery stenosis, bilateral ([place]) 05/26/2013  . Peripheral arterial disease ([place]) 05/26/2013  . Essential hypertension 05/26/2013  . Hyperlipidemia  05/26/2013  . Tobacco abuse 05/26/2013  . Occlusion and stenosis of carotid artery without mention of cerebral infarction 03/09/2012    Alexia Freestone 07/01/2016, 5:07 PM  [place], [place], [address] Phone: [phone]   Fax:  [phone]  Name: Kendra Watts MRN: [id number] Date of Birth: 03-16-1946   Allyson Sabal, PT 07/01/16 5:07 PM

## 2016-07-03 ENCOUNTER — Ambulatory Visit: Payer: Medicare Other

## 2016-07-03 DIAGNOSIS — I890 Lymphedema, not elsewhere classified: Secondary | ICD-10-CM | POA: Diagnosis not present

## 2016-07-03 DIAGNOSIS — R293 Abnormal posture: Secondary | ICD-10-CM | POA: Diagnosis not present

## 2016-07-03 NOTE — Therapy (Signed)
[place], [place], [address] Phone: [phone]   Fax:  [phone]  Physical Therapy Treatment  Patient Details  Name: Kendra Watts MRN: [id number] Date of Birth: 1945/11/08 Referring Provider: [name]  Encounter Date: 07/03/2016      PT End of Session - 07/03/16 1351    Visit Number 5   Number of Visits 13   Date for PT Re-Evaluation 07/15/16   PT Start Time 1301   PT Stop Time 1350   PT Time Calculation (min) 49 min   Activity Tolerance Patient tolerated treatment well   Behavior During Therapy [facility] for tasks assessed/performed      Past Medical History:  Diagnosis Date  . 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 Breast cancer of upper-outer quadrant of left female breast ([place]) 09/08/2015  . Cataract, immature   . Heart murmur    states no known problems  . History of hyperthyroidism   . Hyperlipidemia   . Hypertension    states under control with meds., has been on med. x "long time"  . Hypothyroidism   . Nonfunctioning kidney    left  . Radiation 10/30/15-11/28/15   left breast 42.72 Gy, boosted to 10 Gy  . Renal artery stenosis ([place])   . Tobacco abuse   . Wears partial dentures    upper and lower    Past Surgical History:  Procedure Laterality Date  . ABDOMINAL ANGIOGRAM  02/18/2012   Procedure: ABDOMINAL ANGIOGRAM;  Surgeon: Lorretta Harp, MD;  Location: [facility] CATH LAB;  Service: Cardiovascular;;  . ABDOMINAL AORTAGRAM  07/04/2014  . ABDOMINAL HYSTERECTOMY  ~ 1977   partial  . APPENDECTOMY    . ARCH AORTOGRAM    . CAROTID ANGIOGRAM N/A 02/18/2012   Procedure: CAROTID ANGIOGRAM;  Surgeon: Lorretta Harp, MD;  Location: [facility] CATH LAB;  Service: Cardiovascular;  Laterality: N/A;  . ENDARTERECTOMY  04/02/2012   Procedure: ENDARTERECTOMY CAROTID;  Surgeon: Serafina Mitchell, MD;  Location: [facility] OR;  Service: Vascular;  Laterality: Right;  . RADIOACTIVE SEED GUIDED MASTECTOMY WITH  AXILLARY SENTINEL LYMPH NODE BIOPSY Left 09/22/2015   Procedure: INJECT BLUE DYE LEFT BREAST,RADIOACTIVE SEED GUIDED PARTIAL MASTECTOMY WITH AXILLARY SENTINEL LYMPH NODE BIOPSY;  Surgeon: Fanny Skates, MD;  Location: [place];  Service: General;  Laterality: Left;  . RENAL ANGIOGRAM Left 06/08/2010   renal artery stent -  5x12 Genesis on Aviator balloon stent (Dr. Adora Fridge)  . RENAL ANGIOGRAM Right 07/04/2014   Procedure: RENAL ANGIOGRAM;  Surgeon: Lorretta Harp, MD;  Location: [facility] CATH LAB;  Service: Cardiovascular;  Laterality: Right;  . RENAL ANGIOGRAM Right 08/22/2014   Procedure: RENAL ANGIOGRAM;  Surgeon: Lorretta Harp, MD;  Location: [facility] CATH LAB;  Service: Cardiovascular;  Laterality: Right;  . TONSILLECTOMY     as a child    There were no vitals filed for this visit.      Subjective Assessment - 07/03/16 1315    Subjective I have tried the massage once since I was here last and it's getting me frustrated because I want to get it right and I'm nervous I'm going to mess up. I [facility] [place] tomroow for a compression bra. I've been wearing the chip pack and it's been helping.    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  It is ER/PR positive and HER2 negative., pt underwent left lumpectomy with sentinel lymph node biopsy, pt completed radiation   Patient Stated Goals to get the swelling under control   Currently in Pain? No/denies                         [facility] Adult PT Treatment/Exercise - 07/03/16 0001      Self-Care   Self-Care Other Self-Care Comments   Other Self-Care Comments  Spent first part of session answering pts questions regarding sequence of manual lymph drainage and further instructing her regarding the anatomy of lymphatic system.     Manual Therapy   Manual Therapy --   Manual Lymphatic Drainage (MLD)  Reviewed with pt throughout and had pt return demonstration: short neck, superficial and deep abdominals (but reminded pt to perform 5 diaphragmatic breaths at home in lieu of this), right axillary nodes and establishment of inter axillary pathway, left inguinal nodes and establisment of axillo inguinal pathway, left breast moving fluid towards pathways, reestablishment of pathways educating pt throughout; also had pt in partial Rt S/L to allow her better access to lateral breast fibrosis.                       Breast Clinic Goals - 09/13/15 1620      Patient will be able to verbalize understanding of pertinent lymphedema risk reduction practices relevant to her diagnosis specifically related to skin care.   Time 1   Period Days   Status Achieved     Patient will be able to return demonstrate and/or verbalize understanding of the post-op home exercise program related to regaining shoulder range of motion.   Time 1   Period Days   Status Achieved     Patient will be able to verbalize understanding of the importance of attending the postoperative After Breast Cancer Class for further lymphedema risk reduction education and therapeutic exercise.   Time 1   Period Days   Status Achieved          Long Term Clinic Goals - 06/28/16 1111      CC Long Term Goal  #1   Title Pt will be independent in self manual lymphatic drainage for long term management of left breast edema   Baseline 06/28/16- will begin instructing pt in self drainage at next visit   Time 4   Period Weeks   Status On-going     CC Long Term Goal  #2   Title Pt will receive appropriate compression garment for long term management of left breast edema   Baseline 06/28/16- pt has an appointment to be measured for a compression bra on 07/04/16   Time 4   Period Weeks   Status On-going     CC Long Term Goal  #3   Title Pt will report a 50% improvement in left breast lymphedema to decrease risk of infection    Time 4   Period Weeks   Status On-going     CC Long Term Goal  #4   Title Pt will demonstrate 60 degrees of left shoulder internal rotation to allow her to return to her prior level of function   Baseline 49   Time 4   Period Weeks   Status On-going            Plan - 07/03/16 1531    Clinical Impression Statement Pt was feeling frustrated at beginning of session  with self manual lymph drainage reporting she was afraid she was going to do something wrong though did start to try sequesnce once at home. Reviewed this with her more today and instructed her in anatomy of lymphatic system and had her perform more hand over hand practice. By end of session she reported feeling much better about her technique and "how the sequence works". She has an appt to get measured for a compression bra [facility] tomorrow as well. Overall pt is progressing well towards goals.   Rehab Potential Good   Clinical Impairments Affecting Rehab Potential hx of radiation   PT Frequency 3x / week   PT Duration 4 weeks   PT Treatment/Interventions ADLs/Self Care Home Management;Therapeutic exercise;Orthotic Fit/Training;Manual techniques;Manual lymph drainage;Patient/family education;Scar mobilization;Passive range of motion;Taping   PT Next Visit Plan continue instruction in self MLD   Consulted and Agree with Plan of Care Patient      Patient will benefit from skilled therapeutic intervention in order to improve the following deficits and impairments:  Increased edema, Decreased range of motion, Decreased strength, Decreased scar mobility, Pain  Visit Diagnosis: Lymphedema, not elsewhere classified     Problem List Patient Active Problem List   Diagnosis Date Noted  . Breast cancer of upper-outer quadrant of left female breast ([place]) 09/08/2015  . Renal artery stenosis ([place]) 08/23/2014  . Right upper extremity numbness 07/01/2013  . Atherosclerotic renal artery stenosis, bilateral ([place])  05/26/2013  . Peripheral arterial disease ([place]) 05/26/2013  . Essential hypertension 05/26/2013  . Hyperlipidemia 05/26/2013  . Tobacco abuse 05/26/2013  . Occlusion and stenosis of carotid artery without mention of cerebral infarction 03/09/2012    Otelia Limes, PTA 07/03/2016, 3:40 PM  [place], [place], [address] Phone: [phone]   Fax:  [phone]  Name: Kendra Watts MRN: [id number] Date of Birth: 20-Nov-1945

## 2016-07-05 ENCOUNTER — Ambulatory Visit: Payer: Medicare Other | Admitting: Physical Therapy

## 2016-07-05 ENCOUNTER — Encounter: Payer: Self-pay | Admitting: Physical Therapy

## 2016-07-05 DIAGNOSIS — R293 Abnormal posture: Secondary | ICD-10-CM | POA: Diagnosis not present

## 2016-07-05 DIAGNOSIS — I890 Lymphedema, not elsewhere classified: Secondary | ICD-10-CM

## 2016-07-05 NOTE — Therapy (Signed)
[place], [place], [address] Phone: [phone]   Fax:  [phone]  Physical Therapy Treatment  Patient Details  Name: Kendra Watts MRN: [id number] Date of Birth: 1946-02-04 Referring Provider: [name]  Encounter Date: 07/05/2016      PT End of Session - 07/05/16 1031    Visit Number 6   Number of Visits 13   Date for PT Re-Evaluation 07/15/16   PT Start Time 0932   PT Stop Time 1015   PT Time Calculation (min) 43 min   Activity Tolerance Patient tolerated treatment well   Behavior During Therapy [facility] for tasks assessed/performed      Past Medical History:  Diagnosis Date  . 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 Breast cancer of upper-outer quadrant of left female breast ([place]) 09/08/2015  . Cataract, immature   . Heart murmur    states no known problems  . History of hyperthyroidism   . Hyperlipidemia   . Hypertension    states under control with meds., has been on med. x "long time"  . Hypothyroidism   . Nonfunctioning kidney    left  . Radiation 10/30/15-11/28/15   left breast 42.72 Gy, boosted to 10 Gy  . Renal artery stenosis ([place])   . Tobacco abuse   . Wears partial dentures    upper and lower    Past Surgical History:  Procedure Laterality Date  . ABDOMINAL ANGIOGRAM  02/18/2012   Procedure: ABDOMINAL ANGIOGRAM;  Surgeon: Lorretta Harp, MD;  Location: [facility] CATH LAB;  Service: Cardiovascular;;  . ABDOMINAL AORTAGRAM  07/04/2014  . ABDOMINAL HYSTERECTOMY  ~ 1977   partial  . APPENDECTOMY    . ARCH AORTOGRAM    . CAROTID ANGIOGRAM N/A 02/18/2012   Procedure: CAROTID ANGIOGRAM;  Surgeon: Lorretta Harp, MD;  Location: [facility] CATH LAB;  Service: Cardiovascular;  Laterality: N/A;  . ENDARTERECTOMY  04/02/2012   Procedure: ENDARTERECTOMY CAROTID;  Surgeon: Serafina Mitchell, MD;  Location: [facility] OR;  Service: Vascular;  Laterality: Right;  . RADIOACTIVE SEED GUIDED MASTECTOMY WITH  AXILLARY SENTINEL LYMPH NODE BIOPSY Left 09/22/2015   Procedure: INJECT BLUE DYE LEFT BREAST,RADIOACTIVE SEED GUIDED PARTIAL MASTECTOMY WITH AXILLARY SENTINEL LYMPH NODE BIOPSY;  Surgeon: Fanny Skates, MD;  Location: [place];  Service: General;  Laterality: Left;  . RENAL ANGIOGRAM Left 06/08/2010   renal artery stent -  5x12 Genesis on Aviator balloon stent (Dr. Adora Fridge)  . RENAL ANGIOGRAM Right 07/04/2014   Procedure: RENAL ANGIOGRAM;  Surgeon: Lorretta Harp, MD;  Location: [facility] CATH LAB;  Service: Cardiovascular;  Laterality: Right;  . RENAL ANGIOGRAM Right 08/22/2014   Procedure: RENAL ANGIOGRAM;  Surgeon: Lorretta Harp, MD;  Location: [facility] CATH LAB;  Service: Cardiovascular;  Laterality: Right;  . TONSILLECTOMY     as a child    There were no vitals filed for this visit.      Subjective Assessment - 07/05/16 1025    Subjective I got the compression bra. You can see how it looks and let me know what you think. I think it is comfortable.    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  It is ER/PR positive and HER2 negative., pt underwent left lumpectomy with sentinel lymph node biopsy, pt completed radiation   Patient Stated Goals to get the swelling under control  Currently in Pain? No/denies                         [facility] Adult PT Treatment/Exercise - 07/05/16 0001      Manual Therapy   Manual Lymphatic Drainage (MLD) Reviewed with pt throughout: short neck, 5 diaphragmatic breaths, right axillary nodes and establishment of inter axillary pathway, left inguinal nodes and establisment of axillo inguinal pathway, left breast moving fluid towards pathways, reestablishment of pathways educating pt throughout;                      [facility] Goals - 07/05/16 [phone]      CC Long Term Goal  #1   Title Pt will be independent in self  manual lymphatic drainage for long term management of left breast edema   Baseline 06/28/16- will begin instructing pt in self drainage at next visit, 07/05/16- pt states she if feeling a little more comfortable with self drainage but needs to be more diligent   Time 4   Period Weeks   Status On-going     CC Long Term Goal  #2   Title Pt will receive appropriate compression garment for long term management of left breast edema   Baseline 06/28/16- pt has an appointment to be measured for a compression bra on 07/04/16, 07/05/16- pt received a compression bra from DME supplier   Time 4   Period Weeks   Status Achieved     CC Long Term Goal  #3   Title Pt will report a 50% improvement in left breast lymphedema to decrease risk of infection   Baseline 07/05/16- 50% improvement per pt- she states she is more comfortable as well   Time 4   Period Weeks   Status Achieved     CC Long Term Goal  #4   Title Pt will demonstrate 60 degrees of left shoulder internal rotation to allow her to return to her prior level of function   Baseline 49, 07/05/16- 62 degrees   Time 4   Period Weeks   Status Achieved     CC Long Term Goal  #5   Title Pt will report a 75% improvement in left breast lymphedema to decrease risk of infection   Baseline 07/05/16- 50% improvement   Time 2   Period Weeks   Status New            Plan - 07/05/16 1032    Clinical Impression Statement Assessed pt's progress towards goals in therapy. She has met some of her long term goals including left shoulder internal rotation and she reports a 50% improvement in left breast lymphedema. Patient states she is feeling more comfortable with manual lymphatic drainage but needs to be more diligent with peforming it. She obtained a compression bra yesterday and has started wearing it today. It fits well and is providing adequate compression to left breast.    Rehab Potential Good   Clinical Impairments Affecting Rehab Potential hx  of radiation   PT Frequency 3x / week   PT Duration 4 weeks   PT Treatment/Interventions ADLs/Self Care Home Management;Therapeutic exercise;Orthotic Fit/Training;Manual techniques;Manual lymph drainage;Patient/family education;Scar mobilization;Passive range of motion;Taping   PT Next Visit Plan continue instruction in self MLD, assess if compression bra is helping   PT Home Exercise Plan Shoulder ROM HEP   Consulted and Agree with Plan of Care Patient      Patient will benefit from  skilled therapeutic intervention in order to improve the following deficits and impairments:  Increased edema, Decreased range of motion, Decreased strength, Decreased scar mobility, Pain  Visit Diagnosis: Lymphedema, not elsewhere classified     Problem List Patient Active Problem List   Diagnosis Date Noted  . Breast cancer of upper-outer quadrant of left female breast ([place]) 09/08/2015  . Renal artery stenosis ([place]) 08/23/2014  . Right upper extremity numbness 07/01/2013  . Atherosclerotic renal artery stenosis, bilateral ([place]) 05/26/2013  . Peripheral arterial disease ([place]) 05/26/2013  . Essential hypertension 05/26/2013  . Hyperlipidemia 05/26/2013  . Tobacco abuse 05/26/2013  . Occlusion and stenosis of carotid artery without mention of cerebral infarction 03/09/2012    Alexia Freestone 07/05/2016, 10:35 AM  [place], [place], [address] Phone: [phone]   Fax:  [phone]  Name: Kendra Watts MRN: [id number] Date of Birth: May 07, 1946   Allyson Sabal, PT 07/05/16 10:35 AM

## 2016-07-08 ENCOUNTER — Ambulatory Visit: Payer: Medicare Other | Admitting: Physical Therapy

## 2016-07-08 ENCOUNTER — Encounter: Payer: Self-pay | Admitting: Physical Therapy

## 2016-07-08 DIAGNOSIS — I890 Lymphedema, not elsewhere classified: Secondary | ICD-10-CM | POA: Diagnosis not present

## 2016-07-08 DIAGNOSIS — R293 Abnormal posture: Secondary | ICD-10-CM | POA: Diagnosis not present

## 2016-07-08 NOTE — Therapy (Signed)
[place], [place], [address] Phone: [phone]   Fax:  [phone]  Physical Therapy Treatment  Patient Details  Name: Kendra Watts MRN: [id number] Date of Birth: Jan 13, 1946 Referring Provider: [name]  Encounter Date: 07/08/2016      PT End of Session - 07/08/16 1622    Visit Number 7   Number of Visits 13   Date for PT Re-Evaluation 07/15/16   PT Start Time 1430   PT Stop Time 1517   PT Time Calculation (min) 47 min   Activity Tolerance Patient tolerated treatment well   Behavior During Therapy [facility] for tasks assessed/performed      Past Medical History:  Diagnosis Date  . 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 Breast cancer of upper-outer quadrant of left female breast ([place]) 09/08/2015  . Cataract, immature   . Heart murmur    states no known problems  . History of hyperthyroidism   . Hyperlipidemia   . Hypertension    states under control with meds., has been on med. x "long time"  . Hypothyroidism   . Nonfunctioning kidney    left  . Radiation 10/30/15-11/28/15   left breast 42.72 Gy, boosted to 10 Gy  . Renal artery stenosis ([place])   . Tobacco abuse   . Wears partial dentures    upper and lower    Past Surgical History:  Procedure Laterality Date  . ABDOMINAL ANGIOGRAM  02/18/2012   Procedure: ABDOMINAL ANGIOGRAM;  Surgeon: Lorretta Harp, MD;  Location: [facility] CATH LAB;  Service: Cardiovascular;;  . ABDOMINAL AORTAGRAM  07/04/2014  . ABDOMINAL HYSTERECTOMY  ~ 1977   partial  . APPENDECTOMY    . ARCH AORTOGRAM    . CAROTID ANGIOGRAM N/A 02/18/2012   Procedure: CAROTID ANGIOGRAM;  Surgeon: Lorretta Harp, MD;  Location: [facility] CATH LAB;  Service: Cardiovascular;  Laterality: N/A;  . ENDARTERECTOMY  04/02/2012   Procedure: ENDARTERECTOMY CAROTID;  Surgeon: Serafina Mitchell, MD;  Location: [facility] OR;  Service: Vascular;  Laterality: Right;  . RADIOACTIVE SEED GUIDED MASTECTOMY WITH  AXILLARY SENTINEL LYMPH NODE BIOPSY Left 09/22/2015   Procedure: INJECT BLUE DYE LEFT BREAST,RADIOACTIVE SEED GUIDED PARTIAL MASTECTOMY WITH AXILLARY SENTINEL LYMPH NODE BIOPSY;  Surgeon: Fanny Skates, MD;  Location: [place];  Service: General;  Laterality: Left;  . RENAL ANGIOGRAM Left 06/08/2010   renal artery stent -  5x12 Genesis on Aviator balloon stent (Dr. Adora Fridge)  . RENAL ANGIOGRAM Right 07/04/2014   Procedure: RENAL ANGIOGRAM;  Surgeon: Lorretta Harp, MD;  Location: [facility] CATH LAB;  Service: Cardiovascular;  Laterality: Right;  . RENAL ANGIOGRAM Right 08/22/2014   Procedure: RENAL ANGIOGRAM;  Surgeon: Lorretta Harp, MD;  Location: [facility] CATH LAB;  Service: Cardiovascular;  Laterality: Right;  . TONSILLECTOMY     as a child    There were no vitals filed for this visit.      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  It is ER/PR positive and HER2 negative., pt underwent left lumpectomy with sentinel lymph node biopsy, pt completed radiation   Patient Stated Goals to get the swelling under control   Currently in Pain? No/denies  [place] Adult PT Treatment/Exercise - 07/08/16 0001      Manual Therapy   Edema Management cut pt a new chip pack because she accidently threw her old one away, cut pieces of TG soft and ran pt's bra strap through them since it was causing discomfort from rubbing her skin   Manual Lymphatic Drainage (MLD) Reviewed with pt throughout: short neck, superficial abdominals, 5 diaphragmatic breaths, right axillary nodes and establishment of inter axillary pathway, left inguinal nodes and establisment of axillo inguinal pathway, left breast moving fluid towards pathways, reestablishment of pathways educating pt throughout;                   [facility] Goals - 07/05/16 [phone]      CC Long Term Goal  #1   Title Pt will be independent in self manual lymphatic drainage for long term management of left breast edema   Baseline 06/28/16- will begin instructing pt in self drainage at next visit, 07/05/16- pt states she if feeling a little more comfortable with self drainage but needs to be more diligent   Time 4   Period Weeks   Status On-going     CC Long Term Goal  #2   Title Pt will receive appropriate compression garment for long term management of left breast edema   Baseline 06/28/16- pt has an appointment to be measured for a compression bra on 07/04/16, 07/05/16- pt received a compression bra from DME supplier   Time 4   Period Weeks   Status Achieved     CC Long Term Goal  #3   Title Pt will report a 50% improvement in left breast lymphedema to decrease risk of infection   Baseline 07/05/16- 50% improvement per pt- she states she is more comfortable as well   Time 4   Period Weeks   Status Achieved     CC Long Term Goal  #4   Title Pt will demonstrate 60 degrees of left shoulder internal rotation to allow her to return to her prior level of function   Baseline 49, 07/05/16- 62 degrees   Time 4   Period Weeks   Status Achieved     CC Long Term Goal  #5   Title Pt will report a 75% improvement in left breast lymphedema to decrease risk of infection   Baseline 07/05/16- 50% improvement   Time 2   Period Weeks   Status New            Plan - 07/08/16 1623    Clinical Impression Statement Patient was feeling hesitant with her ability to peform self drainage along her left axillo inguinal pathway. Re educated pt on proper technique today. Pt did not want to return demonstrate today but wanted to learn from therapist again. Next session pt will return demonstrate correct technique so she can get feedback. She has been compliant with her compression bra and the fibrotic areas feel softer.  Made pt another chip bag for her bra today to help reduce fibrosis.    Rehab Potential Good   Clinical Impairments Affecting Rehab Potential hx of radiation   PT Frequency 3x / week   PT Duration 4 weeks   PT Treatment/Interventions ADLs/Self Care Home Management;Therapeutic exercise;Orthotic Fit/Training;Manual techniques;Manual lymph drainage;Patient/family education;Scar mobilization;Passive range of motion;Taping   PT Next Visit Plan ask pt to demonstrate correct self drainage technique and give feedback- possible d/c on Friday?   PT Home Exercise Plan Shoulder ROM HEP  Consulted and Agree with Plan of Care Patient      Patient will benefit from skilled therapeutic intervention in order to improve the following deficits and impairments:  Increased edema, Decreased range of motion, Decreased strength, Decreased scar mobility, Pain  Visit Diagnosis: Lymphedema, not elsewhere classified     Problem List Patient Active Problem List   Diagnosis Date Noted  . Breast cancer of upper-outer quadrant of left female breast ([place]) 09/08/2015  . Renal artery stenosis ([place]) 08/23/2014  . Right upper extremity numbness 07/01/2013  . Atherosclerotic renal artery stenosis, bilateral ([place]) 05/26/2013  . Peripheral arterial disease ([place]) 05/26/2013  . Essential hypertension 05/26/2013  . Hyperlipidemia 05/26/2013  . Tobacco abuse 05/26/2013  . 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

## 2016-07-09 ENCOUNTER — Ambulatory Visit
Admission: RE | Admit: 2016-07-09 | Discharge: 2016-07-09 | Disposition: A | Discharge status: Home / Self Care | Payer: Medicare Other | Source: Ambulatory Visit | Attending: Hematology and Oncology | Admitting: Hematology and Oncology

## 2016-07-09 DIAGNOSIS — R591 Generalized enlarged lymph nodes: Secondary | ICD-10-CM

## 2016-07-09 DIAGNOSIS — N644 Mastodynia: Secondary | ICD-10-CM | POA: Diagnosis not present

## 2016-07-10 ENCOUNTER — Ambulatory Visit: Payer: Medicare Other

## 2016-07-10 DIAGNOSIS — I890 Lymphedema, not elsewhere classified: Secondary | ICD-10-CM

## 2016-07-10 DIAGNOSIS — R293 Abnormal posture: Secondary | ICD-10-CM | POA: Diagnosis not present

## 2016-07-10 NOTE — Therapy (Signed)
[place], [place], [address] Phone: [phone]   Fax:  [phone]  Physical Therapy Treatment  Patient Details  Name: Kendra Watts MRN: [id number] Date of Birth: 1945/12/01 Referring Provider: [name]  Encounter Date: 07/10/2016      PT End of Session - 07/10/16 1347    Visit Number 8   Number of Visits 13   Date for PT Re-Evaluation 07/15/16   PT Start Time 1301   PT Stop Time 3329   PT Time Calculation (min) 46 min   Activity Tolerance Patient tolerated treatment well   Behavior During Therapy [facility] for tasks assessed/performed      Past Medical History:  Diagnosis Date  . 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 Breast cancer of upper-outer quadrant of left female breast ([place]) 09/08/2015  . Cataract, immature   . Heart murmur    states no known problems  . History of hyperthyroidism   . Hyperlipidemia   . Hypertension    states under control with meds., has been on med. x "long time"  . Hypothyroidism   . Nonfunctioning kidney    left  . Radiation 10/30/15-11/28/15   left breast 42.72 Gy, boosted to 10 Gy  . Renal artery stenosis ([place])   . Tobacco abuse   . Wears partial dentures    upper and lower    Past Surgical History:  Procedure Laterality Date  . ABDOMINAL ANGIOGRAM  02/18/2012   Procedure: ABDOMINAL ANGIOGRAM;  Surgeon: Lorretta Harp, MD;  Location: [facility] CATH LAB;  Service: Cardiovascular;;  . ABDOMINAL AORTAGRAM  07/04/2014  . ABDOMINAL HYSTERECTOMY  ~ 1977   partial  . APPENDECTOMY    . ARCH AORTOGRAM    . CAROTID ANGIOGRAM N/A 02/18/2012   Procedure: CAROTID ANGIOGRAM;  Surgeon: Lorretta Harp, MD;  Location: [facility] CATH LAB;  Service: Cardiovascular;  Laterality: N/A;  . ENDARTERECTOMY  04/02/2012   Procedure: ENDARTERECTOMY CAROTID;  Surgeon: Serafina Mitchell, MD;  Location: [facility] OR;  Service: Vascular;  Laterality: Right;  . RADIOACTIVE SEED GUIDED MASTECTOMY WITH  AXILLARY SENTINEL LYMPH NODE BIOPSY Left 09/22/2015   Procedure: INJECT BLUE DYE LEFT BREAST,RADIOACTIVE SEED GUIDED PARTIAL MASTECTOMY WITH AXILLARY SENTINEL LYMPH NODE BIOPSY;  Surgeon: Fanny Skates, MD;  Location: [place];  Service: General;  Laterality: Left;  . RENAL ANGIOGRAM Left 06/08/2010   renal artery stent -  5x12 Genesis on Aviator balloon stent (Dr. Adora Fridge)  . RENAL ANGIOGRAM Right 07/04/2014   Procedure: RENAL ANGIOGRAM;  Surgeon: Lorretta Harp, MD;  Location: [facility] CATH LAB;  Service: Cardiovascular;  Laterality: Right;  . RENAL ANGIOGRAM Right 08/22/2014   Procedure: RENAL ANGIOGRAM;  Surgeon: Lorretta Harp, MD;  Location: [facility] CATH LAB;  Service: Cardiovascular;  Laterality: Right;  . TONSILLECTOMY     as a child    There were no vitals filed for this visit.      Subjective Assessment - 07/10/16 1305    Subjective I had my mammogram and [place] check up yesterday and everything went well except there is a small seroma that they are going to do a draw of the fluid to check on it. That will be 07/19/16. It's been there for awhile but they are just making sure it's nothing.    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  It is  ER/PR positive and HER2 negative., pt underwent left lumpectomy with sentinel lymph node biopsy, pt completed radiation   Patient Stated Goals to get the swelling under control   Currently in Pain? No/denies                         [facility] Adult PT Treatment/Exercise - 07/10/16 0001      Manual Therapy   Manual Lymphatic Drainage (MLD) Reviewed with pt throughout: short neck, superficial abdominals, 5 diaphragmatic breaths, right axillary nodes and establishment of inter axillary pathway, left inguinal nodes and establisment of axillo inguinal pathway, left breast moving fluid towards pathways, reestablishment of pathways  educating pt throughout and had her perform after therapist did.                PT Education - 07/10/16 1349    Education provided Yes   Education Details Review of self manual lymph drainage   Person(s) Educated Patient   Methods Explanation;Demonstration   Comprehension Verbalized understanding;Returned demonstration;Need further instruction              Breast Clinic Goals - 09/13/15 1620      Patient will be able to verbalize understanding of pertinent lymphedema risk reduction practices relevant to her diagnosis specifically related to skin care.   Time 1   Period Days   Status Achieved     Patient will be able to return demonstrate and/or verbalize understanding of the post-op home exercise program related to regaining shoulder range of motion.   Time 1   Period Days   Status Achieved     Patient will be able to verbalize understanding of the importance of attending the postoperative After Breast Cancer Class for further lymphedema risk reduction education and therapeutic exercise.   Time 1   Period Days   Status Achieved          Long Term Clinic Goals - 07/05/16 [phone]      CC Long Term Goal  #1   Title Pt will be independent in self manual lymphatic drainage for long term management of left breast edema   Baseline 06/28/16- will begin instructing pt in self drainage at next visit, 07/05/16- pt states she if feeling a little more comfortable with self drainage but needs to be more diligent   Time 4   Period Weeks   Status On-going     CC Long Term Goal  #2   Title Pt will receive appropriate compression garment for long term management of left breast edema   Baseline 06/28/16- pt has an appointment to be measured for a compression bra on 07/04/16, 07/05/16- pt received a compression bra from DME supplier   Time 4   Period Weeks   Status Achieved     CC Long Term Goal  #3   Title Pt will report a 50% improvement in left breast lymphedema to decrease  risk of infection   Baseline 07/05/16- 50% improvement per pt- she states she is more comfortable as well   Time 4   Period Weeks   Status Achieved     CC Long Term Goal  #4   Title Pt will demonstrate 60 degrees of left shoulder internal rotation to allow her to return to her prior level of function   Baseline 49, 07/05/16- 62 degrees   Time 4   Period Weeks   Status Achieved     CC Long Term Goal  #5   Title  Pt will report a 75% improvement in left breast lymphedema to decrease risk of infection   Baseline 07/05/16- 50% improvement   Time 2   Period Weeks   Status New            Plan - 07/10/16 1347    Clinical Impression Statement Pt did well with review of self manual lymph drainage today and she reported feeling better about it after todays visit. She is wearing her compression bra and reports this has been helping as well.    Rehab Potential Good   Clinical Impairments Affecting Rehab Potential hx of radiation   PT Frequency 3x / week   PT Duration 4 weeks   PT Treatment/Interventions ADLs/Self Care Home Management;Therapeutic exercise;Orthotic Fit/Training;Manual techniques;Manual lymph drainage;Patient/family education;Scar mobilization;Passive range of motion;Taping   PT Next Visit Plan Review self MLD one more time assessing pts technique and plan to D/C next visit.    Consulted and Agree with Plan of Care Patient      Patient will benefit from skilled therapeutic intervention in order to improve the following deficits and impairments:  Increased edema, Decreased range of motion, Decreased strength, Decreased scar mobility, Pain  Visit Diagnosis: Lymphedema, not elsewhere classified     Problem List Patient Active Problem List   Diagnosis Date Noted  . Breast cancer of upper-outer quadrant of left female breast ([place]) 09/08/2015  . Renal artery stenosis ([place]) 08/23/2014  . Right upper extremity numbness 07/01/2013  . Atherosclerotic renal artery stenosis,  bilateral ([place]) 05/26/2013  . Peripheral arterial disease ([place]) 05/26/2013  . Essential hypertension 05/26/2013  . Hyperlipidemia 05/26/2013  . Tobacco abuse 05/26/2013  . Occlusion and stenosis of carotid artery without mention of cerebral infarction 03/09/2012    Otelia Limes, PTA 07/10/2016, 1:50 PM  [place], [place], [address] Phone: [phone]   Fax:  [phone]  Name: Kendra Watts MRN: [id number] Date of Birth: November 18, 1945

## 2016-07-12 ENCOUNTER — Ambulatory Visit: Payer: Medicare Other | Admitting: Physical Therapy

## 2016-07-12 DIAGNOSIS — R293 Abnormal posture: Secondary | ICD-10-CM

## 2016-07-12 DIAGNOSIS — I890 Lymphedema, not elsewhere classified: Secondary | ICD-10-CM | POA: Diagnosis not present

## 2016-07-12 NOTE — Therapy (Signed)
[place], [place], [address] Phone: [phone]   Fax:  [phone]  Physical Therapy Treatment  Patient Details  Name: Kendra Watts MRN: [id number] Date of Birth: November 06, 1945 Referring Provider: [name]  Encounter Date: 07/12/2016      PT End of Session - 07/12/16 1206    Visit Number 9   Number of Visits 13   Date for PT Re-Evaluation 07/15/16   PT Start Time 1010   PT Stop Time 1055   PT Time Calculation (min) 45 min   Activity Tolerance Patient tolerated treatment well   Behavior During Therapy [facility] for tasks assessed/performed      Past Medical History:  Diagnosis Date  . 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 Breast cancer of upper-outer quadrant of left female breast ([place]) 09/08/2015  . Cataract, immature   . Heart murmur    states no known problems  . History of hyperthyroidism   . Hyperlipidemia   . Hypertension    states under control with meds., has been on med. x "long time"  . Hypothyroidism   . Nonfunctioning kidney    left  . Radiation 10/30/15-11/28/15   left breast 42.72 Gy, boosted to 10 Gy  . Renal artery stenosis ([place])   . Tobacco abuse   . Wears partial dentures    upper and lower    Past Surgical History:  Procedure Laterality Date  . ABDOMINAL ANGIOGRAM  02/18/2012   Procedure: ABDOMINAL ANGIOGRAM;  Surgeon: Lorretta Harp, MD;  Location: [facility] CATH LAB;  Service: Cardiovascular;;  . ABDOMINAL AORTAGRAM  07/04/2014  . ABDOMINAL HYSTERECTOMY  ~ 1977   partial  . APPENDECTOMY    . ARCH AORTOGRAM    . CAROTID ANGIOGRAM N/A 02/18/2012   Procedure: CAROTID ANGIOGRAM;  Surgeon: Lorretta Harp, MD;  Location: [facility] CATH LAB;  Service: Cardiovascular;  Laterality: N/A;  . ENDARTERECTOMY  04/02/2012   Procedure: ENDARTERECTOMY CAROTID;  Surgeon: Serafina Mitchell, MD;  Location: [facility] OR;  Service: Vascular;  Laterality: Right;  . RADIOACTIVE SEED GUIDED MASTECTOMY WITH  AXILLARY SENTINEL LYMPH NODE BIOPSY Left 09/22/2015   Procedure: INJECT BLUE DYE LEFT BREAST,RADIOACTIVE SEED GUIDED PARTIAL MASTECTOMY WITH AXILLARY SENTINEL LYMPH NODE BIOPSY;  Surgeon: Fanny Skates, MD;  Location: [place];  Service: General;  Laterality: Left;  . RENAL ANGIOGRAM Left 06/08/2010   renal artery stent -  5x12 Genesis on Aviator balloon stent (Dr. Adora Fridge)  . RENAL ANGIOGRAM Right 07/04/2014   Procedure: RENAL ANGIOGRAM;  Surgeon: Lorretta Harp, MD;  Location: [facility] CATH LAB;  Service: Cardiovascular;  Laterality: Right;  . RENAL ANGIOGRAM Right 08/22/2014   Procedure: RENAL ANGIOGRAM;  Surgeon: Lorretta Harp, MD;  Location: [facility] CATH LAB;  Service: Cardiovascular;  Laterality: Right;  . TONSILLECTOMY     as a child    There were no vitals filed for this visit.      Subjective Assessment - 07/12/16 1014    Subjective (P)  Pt thinks that today will be her last day. She has been doing the manual lymph draiange as best as she can  She feels that her heaviness and tightness are better             [facility] PT Assessment - 07/12/16 0001      Observation/Other Assessments   Other Surveys  --  LLIS 2 or 3%  [place] Adult PT Treatment/Exercise - 07/12/16 0001      Self-Care   Self-Care Other Self-Care Comments   Other Self-Care Comments  Reviewed self manual lymph draiange, suggested Patricia bra by [employer]ft as another option for a compression bra      Shoulder Exercises: Supine   Other Supine Exercises dowel rod flexion for shoulder range of motion and facscial stretching into chest      Manual Therapy   Manual Lymphatic Drainage (MLD) Reviewed with pt throughout: short neck, superficial abdominals, 5 diaphragmatic breaths, right axillary nodes and establishment of inter axillary pathway, left inguinal nodes and establisment of axillo inguinal pathway, left breast moving fluid towards pathways, reestablishment of pathways  educating pt throughout and had her perform after therapist did.                PT Education - 07/12/16 1205    Education provided Yes   Education Details review of self manual lymph drainage, ABC class handouts for shoulder exercise ideas    Person(s) Educated Patient   Methods Explanation;Demonstration;Handout   Comprehension Verbalized understanding;Returned demonstration              Breast Clinic Goals - 09/13/15 1620      Patient will be able to verbalize understanding of pertinent lymphedema risk reduction practices relevant to her diagnosis specifically related to skin care.   Time 1   Period Days   Status Achieved     Patient will be able to return demonstrate and/or verbalize understanding of the post-op home exercise program related to regaining shoulder range of motion.   Time 1   Period Days   Status Achieved     Patient will be able to verbalize understanding of the importance of attending the postoperative After Breast Cancer Class for further lymphedema risk reduction education and therapeutic exercise.   Time 1   Period Days   Status Achieved          Long Term Clinic Goals - 07/12/16 1013      CC Long Term Goal  #1   Title Pt will be independent in self manual lymphatic drainage for long term management of left breast edema   Baseline 06/28/16- will begin instructing pt in self drainage at next visit, 07/05/16- pt states she if feeling a little more comfortable with self drainage but needs to be more diligent   Status Achieved     CC Long Term Goal  #2   Title Pt will receive appropriate compression garment for long term management of left breast edema   Baseline 06/28/16- pt has an appointment to be measured for a compression bra on 07/04/16, 07/05/16- pt received a compression bra from DME supplier   Status Achieved     CC Long Term Goal  #3   Title Pt will report a 50% improvement in left breast lymphedema to decrease risk of infection    Baseline 07/05/16- 50% improvement per pt- she states she is more comfortable as well   Status Achieved     CC Long Term Goal  #4   Title Pt will demonstrate 60 degrees of left shoulder internal rotation to allow her to return to her prior level of function   Baseline 49, 07/05/16- 62 degrees   Status Achieved     CC Long Term Goal  #5   Title Pt will report a 75% improvement in left breast lymphedema to decrease risk of infection   Baseline 07/05/16- 50% improvement  Status Achieved            Plan - July 14, 2016 1206    Clinical Impression Statement Pt continues to have soft lymphedema in left breast with significant breast assymetry.  She is getting some relief with compression bra and plans to get more.  Reinforced shoulder range of motion exercise and encouaraged pt to do community exercise programs.  Feel she will continue to have improvement in lymphedema as she does self manual lymph drainage, exercise and compression.  Sheis ready to discharge from this episode, but knows she can return with a new referral is symtoms persist.    Rehab Potential Good   Clinical Impairments Affecting Rehab Potential hx of radiation   PT Frequency 3x / week   PT Duration 4 weeks   PT Treatment/Interventions ADLs/Self Care Home Management;Therapeutic exercise;Orthotic Fit/Training;Manual techniques;Manual lymph drainage;Patient/family education;Scar mobilization;Passive range of motion;Taping   PT Next Visit Plan dishcarge    Consulted and Agree with Plan of Care Patient      Patient will benefit from skilled therapeutic intervention in order to improve the following deficits and impairments:  Increased edema, Decreased range of motion, Decreased strength, Decreased scar mobility, Pain  Visit Diagnosis: Lymphedema, not elsewhere classified  Abnormal posture       G-Codes - 14-Jul-2016 1202    Functional Assessment Tool Used LLIS   Functional Limitation Other PT primary   Other PT Primary  Goal Status (N4076) 0 percent impaired, limited or restricted   Other PT Primary Discharge Status (K0881) At least 1 percent but less than 20 percent impaired, limited or restricted      Problem List Patient Active Problem List   Diagnosis Date Noted  . Breast cancer of upper-outer quadrant of left female breast ([place]) 09/08/2015  . Renal artery stenosis ([place]) 08/23/2014  . Right upper extremity numbness 07/01/2013  . Atherosclerotic renal artery stenosis, bilateral ([place]) 05/26/2013  . Peripheral arterial disease ([place]) 05/26/2013  . Essential hypertension 05/26/2013  . Hyperlipidemia 05/26/2013  . Tobacco abuse 05/26/2013  . Occlusion and stenosis of carotid artery without mention of cerebral infarction 03/09/2012  PHYSICAL THERAPY DISCHARGE SUMMARY  Visits from Start of Care: 9  Current functional level related to goals / functional outcomes: Pt is able to manage symptoms at home    Remaining deficits: Lymphedema in left breast    Education / Equipment: Exercise, self manual lymph drainage and use of compression  Plan: Patient agrees to discharge.  Patient goals were not met. Patient is being discharged due to meeting the stated rehab goals.  ?????    Donato Heinz. Owens Shark PT  Norwood Levo 2016-07-14, 12:10 PM  [place], [place], [address] Phone: [phone]   Fax:  [phone]  Name: Kendra Watts MRN: [id number] Date of Birth: May 03, 1946

## 2016-07-19 ENCOUNTER — Other Ambulatory Visit: Payer: Self-pay | Admitting: Cardiovascular Disease

## 2016-07-19 ENCOUNTER — Other Ambulatory Visit: Payer: Self-pay | Admitting: General Surgery

## 2016-07-19 DIAGNOSIS — Z720 Tobacco use: Secondary | ICD-10-CM | POA: Diagnosis not present

## 2016-07-19 DIAGNOSIS — Z923 Personal history of irradiation: Secondary | ICD-10-CM | POA: Diagnosis not present

## 2016-07-19 DIAGNOSIS — I701 Atherosclerosis of renal artery: Secondary | ICD-10-CM | POA: Diagnosis not present

## 2016-07-19 DIAGNOSIS — C50412 Malignant neoplasm of upper-outer quadrant of left female breast: Secondary | ICD-10-CM | POA: Diagnosis not present

## 2016-07-19 DIAGNOSIS — Z9889 Other specified postprocedural states: Secondary | ICD-10-CM | POA: Diagnosis not present

## 2016-07-19 DIAGNOSIS — I679 Cerebrovascular disease, unspecified: Secondary | ICD-10-CM | POA: Diagnosis not present

## 2016-07-19 DIAGNOSIS — Z7901 Long term (current) use of anticoagulants: Secondary | ICD-10-CM | POA: Diagnosis not present

## 2016-07-19 DIAGNOSIS — I10 Essential (primary) hypertension: Secondary | ICD-10-CM | POA: Diagnosis not present

## 2016-07-19 DIAGNOSIS — L089 Local infection of the skin and subcutaneous tissue, unspecified: Secondary | ICD-10-CM | POA: Diagnosis not present

## 2016-07-19 DIAGNOSIS — R234 Changes in skin texture: Secondary | ICD-10-CM | POA: Diagnosis not present

## 2016-07-23 ENCOUNTER — Other Ambulatory Visit: Payer: Self-pay

## 2016-07-25 DIAGNOSIS — J209 Acute bronchitis, unspecified: Secondary | ICD-10-CM | POA: Diagnosis not present

## 2016-08-01 ENCOUNTER — Other Ambulatory Visit: Payer: Self-pay | Admitting: General Surgery

## 2016-08-01 DIAGNOSIS — Z853 Personal history of malignant neoplasm of breast: Secondary | ICD-10-CM

## 2016-08-14 ENCOUNTER — Ambulatory Visit
Admission: RE | Admit: 2016-08-14 | Discharge: 2016-08-14 | Disposition: A | Discharge status: Home / Self Care | Payer: Medicare Other | Source: Ambulatory Visit | Attending: General Surgery | Admitting: General Surgery

## 2016-08-14 DIAGNOSIS — R928 Other abnormal and inconclusive findings on diagnostic imaging of breast: Secondary | ICD-10-CM | POA: Diagnosis not present

## 2016-08-14 DIAGNOSIS — Z853 Personal history of malignant neoplasm of breast: Secondary | ICD-10-CM

## 2016-08-22 DIAGNOSIS — R634 Abnormal weight loss: Secondary | ICD-10-CM | POA: Diagnosis not present

## 2016-08-22 DIAGNOSIS — Z853 Personal history of malignant neoplasm of breast: Secondary | ICD-10-CM | POA: Diagnosis not present

## 2016-08-23 ENCOUNTER — Other Ambulatory Visit: Payer: Self-pay | Admitting: Internal Medicine

## 2016-08-23 DIAGNOSIS — R634 Abnormal weight loss: Secondary | ICD-10-CM

## 2016-08-28 ENCOUNTER — Ambulatory Visit
Admission: RE | Admit: 2016-08-28 | Discharge: 2016-08-28 | Disposition: A | Discharge status: Home / Self Care | Payer: Medicare Other | Source: Ambulatory Visit | Attending: Internal Medicine | Admitting: Internal Medicine

## 2016-08-28 ENCOUNTER — Other Ambulatory Visit: Payer: Medicare Other

## 2016-08-28 DIAGNOSIS — R634 Abnormal weight loss: Secondary | ICD-10-CM | POA: Diagnosis not present

## 2016-08-28 MED ORDER — IOPAMIDOL (ISOVUE-300) INJECTION 61%
85.0000 mL | Freq: Once | INTRAVENOUS | Status: AC | PRN
  Administered 2016-08-28: 85 mL via INTRAVENOUS

## 2016-09-05 DIAGNOSIS — R634 Abnormal weight loss: Secondary | ICD-10-CM | POA: Diagnosis not present

## 2016-09-25 ENCOUNTER — Telehealth: Payer: Self-pay | Admitting: Cardiovascular Disease

## 2016-09-25 NOTE — Telephone Encounter (Signed)
Spoke with patient and she stated she has been having burning and tingling in legs for the last month or more Does not have these symptoms when she is up moving around.  Patient is becoming more concerned about symptoms Scheduled appointment for 10/15/16 with Dr Gwenlyn Found (was scheduled for 1 year follow up in April) Advised patient would forward to Dr Gwenlyn Found for further review

## 2016-09-25 NOTE — Telephone Encounter (Signed)
New message    Patient calling C/O tingle in both leg.

## 2016-10-11 ENCOUNTER — Other Ambulatory Visit: Payer: Self-pay | Admitting: Cardiovascular Disease

## 2016-10-15 ENCOUNTER — Encounter: Payer: Self-pay | Admitting: Cardiovascular Disease

## 2016-10-15 ENCOUNTER — Ambulatory Visit (INDEPENDENT_AMBULATORY_CARE_PROVIDER_SITE_OTHER): Payer: Medicare Other | Admitting: Cardiovascular Disease

## 2016-10-15 VITALS — BP 82/64 | HR 88 | Ht 60.0 in | Wt 87.0 lb

## 2016-10-15 DIAGNOSIS — E7800 Pure hypercholesterolemia, unspecified: Secondary | ICD-10-CM | POA: Diagnosis not present

## 2016-10-15 DIAGNOSIS — Z720 Tobacco use: Secondary | ICD-10-CM

## 2016-10-15 DIAGNOSIS — I701 Atherosclerosis of renal artery: Secondary | ICD-10-CM | POA: Diagnosis not present

## 2016-10-15 DIAGNOSIS — I10 Essential (primary) hypertension: Secondary | ICD-10-CM | POA: Diagnosis not present

## 2016-10-15 DIAGNOSIS — I739 Peripheral vascular disease, unspecified: Secondary | ICD-10-CM | POA: Diagnosis not present

## 2016-10-15 NOTE — Progress Notes (Signed)
10/15/2016 Kendra Watts [facility]   [birth year]  [id number]  Primary Physician Merrilee Seashore, MD Primary Cardiologist: Lorretta Harp MD [name]  HPI:  The patient is a delightful 71 year old thin-appearing divorced Caucasian female with no children who I last saw on 11/09/15... 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 She has a history of continued tobacco abuse at 1 pack per day, hypertension and hyperlipidemia with a strong family history for heart disease. Her mother had her first MI at age 27 and died at age 65. I have angiogrammed her revealing a tight right internal carotid artery stenosis, as well as a right subclavian artery stenosis. 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 I stented her left renal artery and she has moderate bilateral iliac disease but really denies claudication.She did had elective right carotid endarterectomy performed by Dr. Trula Slade April 02, 2012.since I saw her a year ago she's been remarkably stable. Carotid Dopplers performed /23/15 revealed a widely patent right carotid endarterectomy site and minimal left carotid disease. Renal Dopplers social progression of disease in the right renal artery. Her left kidney continues to diminish in size despite a patent stent. Recent renal Dopplers performed in April show progression of disease on the right as well.Lower extremity arterial Dopplers to show progression of disease in the iliac arteries as she denies claudication.she denies chest pain or shortness of breath. 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 Follow-up renal Doppler studies performed on 05/19/14 revealed progression of disease in her right renal artery with a renal aortic ratio that went from 6.34-7.75. I'm concerned that she has progression of disease in her only functioning kidney. I performed angiography  on her 07/04/14 via the left brachial approach revealing occluded left renal artery stent and 90% calcified ostial downgoing right renal artery. I decided not to intervene at that time. I'm slightly concerned about the technical difficulties with an ostial calcified lesion with regard to dissection and/or rupture. Her only other option would be renal bypass surgery. I performed percutaneous revascularization on her right renal artery ostia on 08/22/14 via the left brachial approach reducing a 95% calcified ostial right renal artery stenosis and a solitary kidney to 0% residual with an ICast covered Stent. Her most recent renal Doppler study performed 06/07/15 revealed this to be widely patent. Since I saw her a year ago she's remained asymptomatic. She denies chest pain, shortness of breath. She does have some pain in her legs which sound like restless leg syndrome. She had ductal breast carcinoma with lumpectomy performed by Dr. [name] 09/22/15 with subsequent radiation therapy. She did not require chemotherapy. Since I saw her last she's noticed some discomfort in her left wrist when she walks as well as tingling. 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 I will recheck lower extremity arterial Doppler studies.   Current Outpatient Prescriptions  Medication Sig Dispense Refill  . amLODipine (NORVASC) 10 MG tablet Take 10 mg by mouth every morning.    Marland Kitchen aspirin 81 MG tablet Take 162 mg by mouth daily.    . calcium citrate (CALCITRATE - DOSED IN MG ELEMENTAL CALCIUM) 950 MG tablet Take 1,200 mg of elemental calcium by mouth daily.    . clopidogrel (PLAVIX) 75 MG tablet TAKE 1 TABLET BY MOUTH  DAILY 30 tablet 0  . doxazosin (  CARDURA) 8 MG tablet Take 8 mg by mouth every morning.      Marland Kitchen levothyroxine (SYNTHROID, LEVOTHROID) 88 MCG tablet Take 88 mcg by mouth daily before breakfast.    . losartan (COZAAR) 100 MG tablet Take 100 mg by mouth every morning.     . Multiple  Vitamins-Minerals (MULTIVITAMIN WITH MINERALS) tablet Take 1 tablet by mouth every morning.     . rosuvastatin (CRESTOR) 10 MG tablet Take 10 mg by mouth every morning.     . tamoxifen (NOLVADEX) 20 MG tablet Take 1 tablet (20 mg total) by mouth daily. 90 tablet 3   No current facility-administered medications for this visit.     No Known Allergies  Social History   Social History  . Marital status: Divorced    Spouse name: N/A  . Number of children: 0  . Years of education: N/A   Occupational History  . HR administrator [place]   Social History Main Topics  . Smoking status: Current Every Day Smoker    Packs/day: 1.50    Years: 40.00    Types: Cigarettes  . Smokeless tobacco: Never Used  . Alcohol use Yes     Comment: occasionally  . Drug use: No  . Sexual activity: No   Other Topics Concern  . Not on file   Social History Narrative  . No narrative on file     Review of Systems: General: negative for chills, fever, night sweats or weight changes.  Cardiovascular: negative for chest pain, dyspnea on exertion, edema, orthopnea, palpitations, paroxysmal nocturnal dyspnea or shortness of breath Dermatological: negative for rash Respiratory: negative for cough or wheezing Urologic: negative for hematuria Abdominal: negative for nausea, vomiting, diarrhea, bright red blood per rectum, melena, or hematemesis Neurologic: negative for visual changes, syncope, or dizziness All other systems reviewed and are otherwise negative except as noted above.    Blood pressure (!) 82/64, pulse 88, height 5' (1.524 m), weight 87 lb (39.5 kg).  General appearance: alert and no distress Neck: no adenopathy, no JVD, supple, symmetrical, trachea midline, thyroid not enlarged, symmetric, no tenderness/mass/nodules and Soft bilateral carotid bruits Lungs: clear to auscultation bilaterally Heart: regular rate and rhythm, S1, S2 normal, no murmur, click, rub or gallop Extremities:  extremities normal, atraumatic, no cyanosis or edema  EKG sinus rhythm at 88 with septal Q waves. I personally reviewed this EKG.  ASSESSMENT AND PLAN:   Occlusion and stenosis of carotid artery History of carotid artery disease status post elective right carotid endarterectomy performed by Dr. Trula Slade 04/02/12. Dopplers performed 04/27/15 revealed a widely patent endarterectomy site with mild to moderate left ICA stenosis. She did have retrograde right vertebral filling with a 50 mm upper extremity blood pressure differential right lower than left. When I angiogram to her her arch anatomy is consistent with Arteria Lusoria .   Atherosclerotic renal artery stenosis, bilateral History of renal vascular hypertension with a known occluded left renal stent and a small nonfunctioning left kidney status post right renal artery stenting by myself via left brachial approach with an ICast  covered stent 08/22/14. Her follow-up renal Dopplers performed 06/20/16 revealed a patent right renal artery stent.  Essential hypertension History of hypertension with blood pressure not accurate in the right arm because of the known blood pressure differential and unobtainable and left arm because of prior lumpectomy. She is on amlodipine and losartan. Continue current meds at current dosing  Hyperlipidemia History of hyperlipidemia on statin therapy followed by her PCP  Tobacco abuse History of continued tobacco use of more than one pack per day recalcitrant to risk factor modification.      Lorretta Harp MD FACP,FACC,FAHA, [facility] 10/15/2016 2:38 PM

## 2016-10-15 NOTE — Assessment & Plan Note (Signed)
History of hypertension with blood pressure not accurate in the right arm because of the known blood pressure differential and unobtainable and left arm because of prior lumpectomy. She is on amlodipine and losartan. Continue current meds at current dosing

## 2016-10-15 NOTE — Assessment & Plan Note (Signed)
History of carotid artery disease status post elective right carotid endarterectomy performed by Dr. Trula Slade 04/02/12. Dopplers performed 04/27/15 revealed a widely patent endarterectomy site with mild to moderate left ICA stenosis. She did have retrograde right vertebral filling with a 50 mm upper extremity blood pressure differential right lower than left. When I angiogram to her her arch anatomy is consistent with Arteria Lusoria .

## 2016-10-15 NOTE — Assessment & Plan Note (Signed)
History of hyperlipidemia on statin therapy followed by her PCP. 

## 2016-10-15 NOTE — Assessment & Plan Note (Signed)
History of continued tobacco use of more than one pack per day recalcitrant to risk factor modification.

## 2016-10-15 NOTE — Patient Instructions (Signed)
Medication Instructions: Your physician recommends that you continue on your current medications as directed. Please refer to the Current Medication list given to you today.   Testing/Procedures: Your physician has requested that you have a lower extremity arterial duplex. During this test, ultrasound is used to evaluate arterial blood flow in the legs. Allow one hour for this exam. There are no restrictions or special instructions.  Your physician has requested that you have an ankle brachial index (ABI). During this test an ultrasound and blood pressure cuff are used to evaluate the arteries that supply the arms and legs with blood. Allow thirty minutes for this exam. There are no restrictions or special instructions.  We will call you with the results.  Follow-Up: Your physician wants you to follow-up in: 6 months with Dr. Gwenlyn Found. You will receive a reminder letter in the mail two months in advance. If you don't receive a letter, please call our office to schedule the follow-up appointment.  If you need a refill on your cardiac medications before your next appointment, please call your pharmacy.

## 2016-10-15 NOTE — Assessment & Plan Note (Signed)
History of renal vascular hypertension with a known occluded left renal stent and a small nonfunctioning left kidney status post right renal artery stenting by myself via left brachial approach with an ICast  covered stent 08/22/14. Her follow-up renal Dopplers performed 06/20/16 revealed a patent right renal artery stent.

## 2016-10-20 ENCOUNTER — Other Ambulatory Visit: Payer: Self-pay | Admitting: Hematology and Oncology

## 2016-10-30 DIAGNOSIS — R7301 Impaired fasting glucose: Secondary | ICD-10-CM | POA: Diagnosis not present

## 2016-10-30 DIAGNOSIS — N183 Chronic kidney disease, stage 3 (moderate): Secondary | ICD-10-CM | POA: Diagnosis not present

## 2016-10-30 DIAGNOSIS — I10 Essential (primary) hypertension: Secondary | ICD-10-CM | POA: Diagnosis not present

## 2016-10-30 DIAGNOSIS — E782 Mixed hyperlipidemia: Secondary | ICD-10-CM | POA: Diagnosis not present

## 2016-11-05 DIAGNOSIS — N183 Chronic kidney disease, stage 3 (moderate): Secondary | ICD-10-CM | POA: Diagnosis not present

## 2016-11-05 DIAGNOSIS — E782 Mixed hyperlipidemia: Secondary | ICD-10-CM | POA: Diagnosis not present

## 2016-11-05 DIAGNOSIS — I739 Peripheral vascular disease, unspecified: Secondary | ICD-10-CM | POA: Diagnosis not present

## 2016-11-05 DIAGNOSIS — R7303 Prediabetes: Secondary | ICD-10-CM | POA: Diagnosis not present

## 2016-11-15 ENCOUNTER — Ambulatory Visit: Payer: Medicare Other | Admitting: Cardiovascular Disease

## 2016-11-15 ENCOUNTER — Ambulatory Visit (HOSPITAL_COMMUNITY)
Admission: RE | Admit: 2016-11-15 | Discharge: 2016-11-15 | Disposition: A | Discharge status: Home / Self Care | Payer: Medicare Other | Source: Ambulatory Visit | Attending: Cardiovascular Disease | Admitting: Cardiovascular Disease

## 2016-11-15 ENCOUNTER — Other Ambulatory Visit: Payer: Self-pay | Admitting: Cardiovascular Disease

## 2016-11-15 ENCOUNTER — Other Ambulatory Visit: Payer: Self-pay | Admitting: Hematology and Oncology

## 2016-11-15 DIAGNOSIS — Z720 Tobacco use: Secondary | ICD-10-CM | POA: Insufficient documentation

## 2016-11-15 DIAGNOSIS — I771 Stricture of artery: Secondary | ICD-10-CM | POA: Insufficient documentation

## 2016-11-15 DIAGNOSIS — E784 Other hyperlipidemia: Secondary | ICD-10-CM | POA: Insufficient documentation

## 2016-11-15 DIAGNOSIS — I739 Peripheral vascular disease, unspecified: Secondary | ICD-10-CM | POA: Insufficient documentation

## 2016-11-15 DIAGNOSIS — I10 Essential (primary) hypertension: Secondary | ICD-10-CM | POA: Diagnosis not present

## 2016-11-29 ENCOUNTER — Telehealth: Payer: Self-pay | Admitting: Hematology and Oncology

## 2016-11-29 NOTE — Telephone Encounter (Signed)
Called patient to confirm appointment and she had an appointment conflict so I changer her to 5/22 at 11:15

## 2016-12-04 ENCOUNTER — Ambulatory Visit: Payer: Medicare Other | Admitting: Hematology and Oncology

## 2016-12-06 ENCOUNTER — Ambulatory Visit: Payer: Medicare Other | Admitting: Hematology and Oncology

## 2016-12-10 ENCOUNTER — Ambulatory Visit (HOSPITAL_BASED_OUTPATIENT_CLINIC_OR_DEPARTMENT_OTHER): Payer: Medicare Other | Admitting: Hematology and Oncology

## 2016-12-10 ENCOUNTER — Encounter: Payer: Self-pay | Admitting: Hematology and Oncology

## 2016-12-10 DIAGNOSIS — Z79810 Long term (current) use of selective estrogen receptor modulators (SERMs): Secondary | ICD-10-CM

## 2016-12-10 DIAGNOSIS — C50412 Malignant neoplasm of upper-outer quadrant of left female breast: Secondary | ICD-10-CM

## 2016-12-10 DIAGNOSIS — Z170 Estrogen receptor positive status [ER+]: Secondary | ICD-10-CM

## 2016-12-10 NOTE — Progress Notes (Signed)
Patient Care Team: Merrilee Seashore, MD as PCP - General (Internal Medicine) Lorretta Harp, MD as Attending Physician (Cardiology) Milus Banister, MD (Gastroenterology) Molli Posey, MD as Consulting Physician (Obstetrics and Gynecology) Sylvan Cheese, NP as Nurse Practitioner (Hematology and Oncology)  DIAGNOSIS:  Encounter Diagnosis  Name Primary?  . Malignant neoplasm of upper-outer quadrant of left breast in female, estrogen receptor positive ([place])     SUMMARY OF ONCOLOGIC HISTORY:   Breast cancer of upper-outer quadrant of left female breast ([place])   08/30/2015 Imaging    DEXA scan: Normal       09/07/2015 Initial Diagnosis    Screening detected left breast mass at 1:00 position 4 x 3 x 4 mm biopsy grade 1 invasive ductal carcinoma ER 100% PR 90% positive HER-2 negative ratio 1.18 Ki-67 10%, T1 aN0 stage IA clinical stage      09/22/2015 Surgery    Left lumpectomy [name]): IDC grade 1, 1.1 minutes, ADH, LCIS, margins negative, 0/3 lymph nodes, ER 100%, PR 90%, HER-2 negative, Ki-67 10%, T1 cN0 stage IA, Oncotype DX score 14, 9% ROR      10/30/2015 - 11/28/2015 Radiation Therapy    Adjuvant XRT ([facility]). Left breast: 42.72 Gy in 16 treatments.  Left breast "boost": 10 Gy in 5 treatments.  Total: 52.72 Gy in 21 treatments       11/2015 -  Anti-estrogen oral therapy    Tamoxifen 24m daily. Planned duration of treatment: 5 years ([name]       CHIEF COMPLIANT: Follow-up on tamoxifen therapy  INTERVAL HISTORY: Kendra DOBBINSis a 71year old with above-mentioned history of left breast cancer treated with lumpectomy and radiation and is currently on tamoxifen. She is tolerating tamoxifen extremely well. She denies any hot flashes or night sweats. Denies any myalgias.  REVIEW OF SYSTEMS:   Constitutional: Denies fevers, chills or abnormal weight loss Eyes: Denies blurriness of vision Ears, nose, mouth, throat, and face: Denies mucositis or sore  throat Respiratory: Denies cough, dyspnea or wheezes Cardiovascular: Denies palpitation, chest discomfort Gastrointestinal:  Denies nausea, heartburn or change in bowel habits Skin: Denies abnormal skin rashes Lymphatics: Denies new lymphadenopathy or easy bruising Neurological:Denies numbness, tingling or new weaknesses Behavioral/Psych: Mood is stable, no new changes  Extremities: No lower extremity edema Breast:  Intermittent breast discomfort and skin thickening with lymphedema All other systems were reviewed with the patient and are negative.  I have reviewed the past medical history, past surgical history, social history and family history with the patient and they are unchanged from previous note.  ALLERGIES:  has No Known Allergies.  MEDICATIONS:  Current Outpatient Prescriptions  Medication Sig Dispense Refill  . amLODipine (NORVASC) 10 MG tablet Take 10 mg by mouth every morning.    .Marland Kitchenaspirin 81 MG tablet Take 162 mg by mouth daily.    . calcium citrate (CALCITRATE - DOSED IN MG ELEMENTAL CALCIUM) 950 MG tablet Take 1,200 mg of elemental calcium by mouth daily.    . clopidogrel (PLAVIX) 75 MG tablet TAKE 1 TABLET BY MOUTH  DAILY 30 tablet 3  . doxazosin (CARDURA) 8 MG tablet Take 8 mg by mouth every morning.      .Marland Kitchenlevothyroxine (SYNTHROID, LEVOTHROID) 88 MCG tablet Take 88 mcg by mouth daily before breakfast.    . losartan (COZAAR) 100 MG tablet Take 100 mg by mouth every morning.     . Multiple Vitamins-Minerals (MULTIVITAMIN WITH MINERALS) tablet Take 1 tablet by mouth every morning.     .Marland Kitchen  rosuvastatin (CRESTOR) 10 MG tablet Take 10 mg by mouth every morning.     . tamoxifen (NOLVADEX) 20 MG tablet TAKE 1 TABLET BY MOUTH  DAILY 90 tablet 3   No current facility-administered medications for this visit.     PHYSICAL EXAMINATION: ECOG PERFORMANCE STATUS: 1 - Symptomatic but completely ambulatory  Vitals:   12/10/16 1113  BP: (!) 160/58  Pulse: 91  Temp: 98.5 F (36.9  C)   Filed Weights   12/10/16 1113  Weight: 88 lb 6.4 oz (40.1 kg)    GENERAL:alert, no distress and comfortable SKIN: skin color, texture, turgor are normal, no rashes or significant lesions EYES: normal, Conjunctiva are pink and non-injected, sclera clear OROPHARYNX:no exudate, no erythema and lips, buccal mucosa, and tongue normal  NECK: supple, thyroid normal size, non-tender, without nodularity LYMPH:  no palpable lymphadenopathy in the cervical, axillary or inguinal LUNGS: clear to auscultation and percussion with normal breathing effort HEART: regular rate & rhythm and no murmurs and no lower extremity edema ABDOMEN:abdomen soft, non-tender and normal bowel sounds MUSCULOSKELETAL:no cyanosis of digits and no clubbing  NEURO: alert & oriented x 3 with fluent speech, no focal motor/sensory deficits EXTREMITIES: No lower extremity edema BREAST: Left breast skin thickening and lymphedema and intermittent breast discomfort. (exam performed in the presence of a chaperone)  LABORATORY DATA:  I have reviewed the data as listed   Chemistry      Component Value Date/Time   NA 139 09/13/2015 1224   K 4.1 09/13/2015 1224   CL 107 08/23/2014 0419   CO2 22 09/13/2015 1224   BUN 14.8 09/13/2015 1224   CREATININE 1.0 09/13/2015 1224      Component Value Date/Time   CALCIUM 9.8 09/13/2015 1224   ALKPHOS 65 09/13/2015 1224   AST 19 09/13/2015 1224   ALT 16 09/13/2015 1224   BILITOT 0.37 09/13/2015 1224       Lab Results  Component Value Date   WBC 7.4 09/13/2015   HGB 14.4 09/13/2015   HCT 43.9 09/13/2015   MCV 96.3 09/13/2015   PLT 212 09/13/2015   NEUTROABS 5.6 09/13/2015    ASSESSMENT & PLAN:  Breast cancer of upper-outer quadrant of left female breast ([place]) Left lumpectomy 09/22/2015: IDC grade 1, 1.1 cm, ADH, LCIS, margins negative, 0/3 lymph nodes, ER 100%, PR 90%, HER-2 negative, Ki-67 10%, T1 cN0 stage IA, Oncotype DX score 14, 9% risk of recurrence Adjuvant  radiation therapy 10/30/2015 to 11/28/2015  Treatment plan: Adjuvant antiestrogen therapy with tamoxifen 20 mg by mouth daily 5 years (chosen due to osteoporosis T score of -2.5 done in February 2017)  Tamoxifen Toxicities: 1. Very occasional hot flashes  Fullness in the left axilla and breast: Mild lymphedema Biopsy revealed fibrosis and inflammation  Surveillance: Mammograms 08/14/2016: No mammographic evidence of breast malignancy unchanged left breast skin thickening.  RTC in one year for follow up.  I spent 25 minutes talking to the patient of which more than half was spent in counseling and coordination of care.  No orders of the defined types were placed in this encounter.  The patient has a good understanding of the overall plan. she agrees with it. she will call with any problems that may develop before the next visit here.   Rulon Eisenmenger, MD 12/10/16

## 2016-12-10 NOTE — Assessment & Plan Note (Signed)
Left lumpectomy 09/22/2015: IDC grade 1, 1.1 cm, ADH, LCIS, margins negative, 0/3 lymph nodes, ER 100%, PR 90%, HER-2 negative, Ki-67 10%, T1 cN0 stage IA, Oncotype DX score 14, 9% risk of recurrence Adjuvant radiation therapy 10/30/2015 to 11/28/2015  Treatment plan: Adjuvant antiestrogen therapy with tamoxifen 20 mg by mouth daily 5 years (chosen due to osteoporosis T score of -2.5 done in February 2017)  Tamoxifen Toxicities: 1. Very occasional hot flashes  Fullness in the left axilla and breast: Mild lymphedema Biopsy revealed fibrosis and inflammation  Surveillance: Mammograms 08/14/2016: No mammographic evidence of breast malignancy unchanged left breast skin thickening.  RTC in one year for follow up.

## 2016-12-13 DIAGNOSIS — K047 Periapical abscess without sinus: Secondary | ICD-10-CM | POA: Diagnosis not present

## 2017-04-01 DIAGNOSIS — R0602 Shortness of breath: Secondary | ICD-10-CM | POA: Diagnosis not present

## 2017-04-01 DIAGNOSIS — J439 Emphysema, unspecified: Secondary | ICD-10-CM | POA: Diagnosis not present

## 2017-04-01 DIAGNOSIS — F17210 Nicotine dependence, cigarettes, uncomplicated: Secondary | ICD-10-CM | POA: Diagnosis not present

## 2017-04-08 ENCOUNTER — Ambulatory Visit (INDEPENDENT_AMBULATORY_CARE_PROVIDER_SITE_OTHER): Payer: Medicare Other | Admitting: Cardiovascular Disease

## 2017-04-08 ENCOUNTER — Encounter: Payer: Self-pay | Admitting: Cardiovascular Disease

## 2017-04-08 VITALS — BP 105/73 | HR 101 | Ht 61.0 in | Wt 89.2 lb

## 2017-04-08 DIAGNOSIS — Z720 Tobacco use: Secondary | ICD-10-CM | POA: Diagnosis not present

## 2017-04-08 DIAGNOSIS — I701 Atherosclerosis of renal artery: Secondary | ICD-10-CM | POA: Diagnosis not present

## 2017-04-08 DIAGNOSIS — E7800 Pure hypercholesterolemia, unspecified: Secondary | ICD-10-CM | POA: Diagnosis not present

## 2017-04-08 DIAGNOSIS — I6521 Occlusion and stenosis of right carotid artery: Secondary | ICD-10-CM | POA: Diagnosis not present

## 2017-04-08 DIAGNOSIS — I739 Peripheral vascular disease, unspecified: Secondary | ICD-10-CM | POA: Diagnosis not present

## 2017-04-08 NOTE — Progress Notes (Signed)
04/08/2017 Kendra Watts [facility]   February 24, 1946  [id number]  Primary Physician Merrilee Seashore, MD Primary Cardiologist: Lorretta Harp MD Lupe Carney, [place]  HPI:  Kendra Watts is a 71 y.o. female hin-appearing divorced Caucasian female with no children who I last saw on 10/15/16... She is retired from doing [employer] at [employer]. She has a history of continued tobacco abuse at 1 pack per day, hypertension and hyperlipidemia with a strong family history for heart disease. Her mother had her first MI at age 39 and died at age 48. I have angiogrammed her revealing a tight right internal carotid artery stenosis, as well as a right subclavian artery stenosis. 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 I stented her left renal artery and she has moderate bilateral iliac disease but really denies claudication.She did had elective right carotid endarterectomy performed by Dr. Trula Slade April 02, 2012.since I saw her a year ago she's been remarkably stable. Carotid Dopplers performed /23/15 revealed a widely patent right carotid endarterectomy site and minimal left carotid disease. Renal Dopplers social progression of disease in the right renal artery. Her left kidney continues to diminish in size despite a patent stent. Recent renal Dopplers performed in April show progression of disease on the right as well.Lower extremity arterial Dopplers to show progression of disease in the iliac arteries as she denies claudication.she denies chest pain or shortness of breath. 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 Follow-up renal Doppler studies performed on 05/19/14 revealed progression of disease in her right renal artery with a renal aortic ratio that went from 6.34-7.61. I'm concerned that she has progression of disease in her only functioning kidney. I performed angiography on  her 07/04/14 via the left brachial approach revealing occluded left renal artery stent and 90% calcified ostial downgoing right renal artery. I decided not to intervene at that time. I'm slightly concerned about the technical difficulties with an ostial calcified lesion with regard to dissection and/or rupture. Her only other option would be renal bypass surgery. I performed percutaneous revascularization on her right renal artery ostia on 08/22/14 via the left brachial approach reducing a 95% calcified ostial right renal artery stenosis and a solitary kidney to 0% residual with an ICast covered Stent. Her most recent renal Doppler study performed 06/07/15 revealed this to be widely patent. Since I saw her a year ago she's remained asymptomatic. She denies chest pain, shortness of breath. She does have some pain in her legs which sound like restless leg syndrome. She had ductal breast carcinoma with lumpectomy performed by Dr. [name] 09/22/15 with subsequent radiation therapy. She did not require chemotherapy. Since I saw her last she's noticed some discomfort in her left wrist when she walks as well as tingling. 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  Since I saw her in the office 6 months ago she does complain of lifestyle limiting claudication. She is chronically short of breath but does continue to smoke a pack and a half a day. She denies chest pain. I'm going to repeat carotid and lower extremity or chill Doppler studies.   Current Meds  Medication Sig  . amLODipine (NORVASC) 10 MG tablet Take 10 mg by mouth every morning.  Marland Kitchen aspirin 81 MG tablet Take 162 mg by mouth daily.  . calcium citrate (CALCITRATE -  DOSED IN MG ELEMENTAL CALCIUM) 950 MG tablet Take 1,200 mg of elemental calcium by mouth daily.  . clopidogrel (PLAVIX) 75 MG tablet TAKE 1 TABLET BY MOUTH  DAILY  . doxazosin (CARDURA) 8 MG tablet Take 8 mg by mouth every morning.    Marland Kitchen levothyroxine  (SYNTHROID, LEVOTHROID) 88 MCG tablet Take 88 mcg by mouth daily before breakfast.  . losartan (COZAAR) 100 MG tablet Take 100 mg by mouth every morning.   . Multiple Vitamins-Minerals (MULTIVITAMIN WITH MINERALS) tablet Take 1 tablet by mouth every morning.   . rosuvastatin (CRESTOR) 10 MG tablet Take 10 mg by mouth every morning.   . tamoxifen (NOLVADEX) 20 MG tablet TAKE 1 TABLET BY MOUTH  DAILY     No Known Allergies  Social History   Social History  . Marital status: Divorced    Spouse name: N/A  . Number of children: 0  . Years of education: N/A   Occupational History  . HR administrator [place]   Social History Main Topics  . Smoking status: Current Every Day Smoker    Packs/day: 1.50    Years: 40.00    Types: Cigarettes  . Smokeless tobacco: Never Used  . Alcohol use Yes     Comment: occasionally  . Drug use: No  . Sexual activity: No   Other Topics Concern  . Not on file   Social History Narrative  . No narrative on file     Review of Systems: General: negative for chills, fever, night sweats or weight changes.  Cardiovascular: negative for chest pain, dyspnea on exertion, edema, orthopnea, palpitations, paroxysmal nocturnal dyspnea or shortness of breath Dermatological: negative for rash Respiratory: negative for cough or wheezing Urologic: negative for hematuria Abdominal: negative for nausea, vomiting, diarrhea, bright red blood per rectum, melena, or hematemesis Neurologic: negative for visual changes, syncope, or dizziness All other systems reviewed and are otherwise negative except as noted above.    Blood pressure 105/73, pulse (!) 101, height 5\' 1"  (1.549 m), weight 89 lb 3.2 oz (40.5 kg).  General appearance: alert and no distress Neck: no adenopathy, no JVD, supple, symmetrical, trachea midline, thyroid not enlarged, symmetric, no tenderness/mass/nodules and Soft bilateral carotid bruits Lungs: clear to auscultation bilaterally Heart:  regular rate and rhythm, S1, S2 normal, no murmur, click, rub or gallop Extremities: extremities normal, atraumatic, no cyanosis or edema Pulses: 2+ and symmetric Skin: Skin color, texture, turgor normal. No rashes or lesions Neurologic: Alert and oriented X 3, normal strength and tone. Normal symmetric reflexes. Normal coordination and gait  EKG  Sinus tachycardia 101 with septal Q wves and low limb voltage with right atrial enlargement. I personally reviewed this EKG.  ASSESSMENT AND PLAN:   Carotid artery stenosis History of carotid artery disease status post elective right carotid endarterectomy performed by Dr. Trula Slade 04/02/12. Her last carotid Doppler study performed 04/27/15 revealed her endarterectomy site to be widely patent with mild to moderate left ICA stenosis. She does have bilateral carotid bruits. Will recheck carotid Doppler studies.  Atherosclerotic renal artery stenosis, bilateral History of renal artery stenosis status post stenting of her left renal artery in the past. I thought that she had ischemic nephropathy and because of a high renal aortic ratio with a nonfunctioning left kidney and I ultimately stented her right renal artery via the left brachial approach on 07/04/14 using a ICast covered stent because of the calcific nature of the ostium. Her most recent renal Doppler studies performed 06/20/16 revealed the  stent to be widely patent with a small nonfunctioning left kidney. We'll continue to follow this on an annual basis  Peripheral arterial disease History of peripheral arterial disease with bilateral iliac disease demonstrated angiographically the past. At prior visit she denied claudication limitation says she does have limiting claudication. We'll recheck lower extremity arterial Doppler studies.  Essential hypertension History of essential hypertension blood pressure measured 105/73. She is on amlodipine and losartan. Continue current meds at current  dosing  Hyperlipidemia History of hyperlipidemia on statin therapy followed by her PCP  Tobacco abuse History of continued tobacco abuse of one to one and a half packs per day recalcitrant risk factor modification.      Lorretta Harp MD FACP,FACC,FAHA, [facility] 04/08/2017 2:59 PM

## 2017-04-08 NOTE — Assessment & Plan Note (Signed)
History of hyperlipidemia on statin therapy followed by her PCP. 

## 2017-04-08 NOTE — Assessment & Plan Note (Signed)
History of essential hypertension blood pressure measured 105/73. She is on amlodipine and losartan. Continue current meds at current dosing

## 2017-04-08 NOTE — Patient Instructions (Signed)
Medication Instructions: Your physician recommends that you continue on your current medications as directed. Please refer to the Current Medication list given to you today.   Labwork: Your physician recommends that you return for a FASTING lipid profile and hepatic function panel.   Testing/Procedures: Your physician has requested that you have a lower extremity arterial duplex. During this test, ultrasound is used to evaluate arterial blood flow in the legs. Allow one hour for this exam. There are no restrictions or special instructions.  Your physician has requested that you have an ankle brachial index (ABI). During this test an ultrasound and blood pressure cuff are used to evaluate the arteries that supply the arms and legs with blood. Allow thirty minutes for this exam. There are no restrictions or special instructions.  Your physician has requested that you have a carotid duplex. This test is an ultrasound of the carotid arteries in your neck. It looks at blood flow through these arteries that supply the brain with blood. Allow one hour for this exam. There are no restrictions or special instructions.  Your physician has requested that you have a renal artery duplex in November. During this test, an ultrasound is used to evaluate blood flow to the kidneys. Allow one hour for this exam. Do not eat after midnight the day before and avoid carbonated beverages. Take your medications as you usually do.   Follow-Up: Your physician wants you to follow-up in: 1 year with Dr. Gwenlyn Found. You will receive a reminder letter in the mail two months in advance. If you don't receive a letter, please call our office to schedule the follow-up appointment.  If you need a refill on your cardiac medications before your next appointment, please call your pharmacy.

## 2017-04-08 NOTE — Assessment & Plan Note (Signed)
History of continued tobacco abuse of one to one and a half packs per day recalcitrant risk factor modification.

## 2017-04-08 NOTE — Assessment & Plan Note (Signed)
History of carotid artery disease status post elective right carotid endarterectomy performed by Dr. Trula Slade 04/02/12. Her last carotid Doppler study performed 04/27/15 revealed her endarterectomy site to be widely patent with mild to moderate left ICA stenosis. She does have bilateral carotid bruits. Will recheck carotid Doppler studies.

## 2017-04-08 NOTE — Assessment & Plan Note (Signed)
History of renal artery stenosis status post stenting of her left renal artery in the past. I thought that she had ischemic nephropathy and because of a high renal aortic ratio with a nonfunctioning left kidney and I ultimately stented her right renal artery via the left brachial approach on 07/04/14 using a ICast covered stent because of the calcific nature of the ostium. Her most recent renal Doppler studies performed 06/20/16 revealed the stent to be widely patent with a small nonfunctioning left kidney. We'll continue to follow this on an annual basis

## 2017-04-08 NOTE — Assessment & Plan Note (Signed)
History of peripheral arterial disease with bilateral iliac disease demonstrated angiographically the past. At prior visit she denied claudication limitation says she does have limiting claudication. We'll recheck lower extremity arterial Doppler studies.

## 2017-04-18 DIAGNOSIS — H25813 Combined forms of age-related cataract, bilateral: Secondary | ICD-10-CM | POA: Diagnosis not present

## 2017-04-18 DIAGNOSIS — H43813 Vitreous degeneration, bilateral: Secondary | ICD-10-CM | POA: Diagnosis not present

## 2017-04-18 DIAGNOSIS — H52203 Unspecified astigmatism, bilateral: Secondary | ICD-10-CM | POA: Diagnosis not present

## 2017-04-18 DIAGNOSIS — H3552 Pigmentary retinal dystrophy: Secondary | ICD-10-CM | POA: Diagnosis not present

## 2017-04-23 DIAGNOSIS — E7800 Pure hypercholesterolemia, unspecified: Secondary | ICD-10-CM | POA: Diagnosis not present

## 2017-04-23 LAB — HEPATIC FUNCTION PANEL
ALT: 15 IU/L (ref 0–32)
AST: 22 IU/L (ref 0–40)
Albumin: 4.5 g/dL (ref 3.5–4.8)
Alkaline Phosphatase: 58 IU/L (ref 39–117)
Bilirubin Total: 0.5 mg/dL (ref 0.0–1.2)
Bilirubin, Direct: 0.18 mg/dL (ref 0.00–0.40)
Total Protein: 6.8 g/dL (ref 6.0–8.5)

## 2017-04-23 LAB — LIPID PANEL
Chol/HDL Ratio: 1.6 ratio (ref 0.0–4.4)
Cholesterol, Total: 148 mg/dL (ref 100–199)
HDL: 92 mg/dL (ref >39)
LDL Calculated: 45 mg/dL (ref 0–99)
Triglycerides: 57 mg/dL (ref 0–149)
VLDL Cholesterol Cal: 11 mg/dL (ref 5–40)

## 2017-04-24 ENCOUNTER — Ambulatory Visit (HOSPITAL_COMMUNITY)
Admission: RE | Admit: 2017-04-24 | Discharge: 2017-04-24 | Disposition: A | Discharge status: Home / Self Care | Payer: Medicare Other | Source: Ambulatory Visit | Attending: Cardiology | Admitting: Cardiology

## 2017-04-24 ENCOUNTER — Ambulatory Visit (HOSPITAL_BASED_OUTPATIENT_CLINIC_OR_DEPARTMENT_OTHER)
Admission: RE | Admit: 2017-04-24 | Discharge: 2017-04-24 | Disposition: A | Discharge status: Home / Self Care | Payer: Medicare Other | Source: Ambulatory Visit | Attending: Cardiovascular Disease | Admitting: Cardiovascular Disease

## 2017-04-24 DIAGNOSIS — I10 Essential (primary) hypertension: Secondary | ICD-10-CM | POA: Diagnosis not present

## 2017-04-24 DIAGNOSIS — E785 Hyperlipidemia, unspecified: Secondary | ICD-10-CM | POA: Diagnosis not present

## 2017-04-24 DIAGNOSIS — I6521 Occlusion and stenosis of right carotid artery: Secondary | ICD-10-CM

## 2017-04-24 DIAGNOSIS — I6523 Occlusion and stenosis of bilateral carotid arteries: Secondary | ICD-10-CM | POA: Insufficient documentation

## 2017-04-24 DIAGNOSIS — I739 Peripheral vascular disease, unspecified: Secondary | ICD-10-CM | POA: Diagnosis not present

## 2017-04-24 DIAGNOSIS — R9389 Abnormal findings on diagnostic imaging of other specified body structures: Secondary | ICD-10-CM | POA: Insufficient documentation

## 2017-04-24 DIAGNOSIS — F17200 Nicotine dependence, unspecified, uncomplicated: Secondary | ICD-10-CM | POA: Diagnosis not present

## 2017-04-24 DIAGNOSIS — I771 Stricture of artery: Secondary | ICD-10-CM | POA: Diagnosis not present

## 2017-04-29 DIAGNOSIS — M5136 Other intervertebral disc degeneration, lumbar region: Secondary | ICD-10-CM | POA: Diagnosis not present

## 2017-04-29 DIAGNOSIS — M47816 Spondylosis without myelopathy or radiculopathy, lumbar region: Secondary | ICD-10-CM | POA: Diagnosis not present

## 2017-04-29 DIAGNOSIS — M545 Low back pain: Secondary | ICD-10-CM | POA: Diagnosis not present

## 2017-04-29 DIAGNOSIS — M5442 Lumbago with sciatica, left side: Secondary | ICD-10-CM | POA: Diagnosis not present

## 2017-05-08 ENCOUNTER — Ambulatory Visit (INDEPENDENT_AMBULATORY_CARE_PROVIDER_SITE_OTHER): Payer: Medicare Other | Admitting: Cardiovascular Disease

## 2017-05-08 ENCOUNTER — Other Ambulatory Visit: Payer: Self-pay | Admitting: Cardiovascular Disease

## 2017-05-08 ENCOUNTER — Encounter: Payer: Self-pay | Admitting: Cardiovascular Disease

## 2017-05-08 DIAGNOSIS — I6523 Occlusion and stenosis of bilateral carotid arteries: Secondary | ICD-10-CM

## 2017-05-08 DIAGNOSIS — I701 Atherosclerosis of renal artery: Secondary | ICD-10-CM | POA: Diagnosis not present

## 2017-05-08 DIAGNOSIS — I739 Peripheral vascular disease, unspecified: Secondary | ICD-10-CM

## 2017-05-08 NOTE — Progress Notes (Signed)
[name] returns today for follow-up of her carotid largely Doppler studies. Her carotids revealed less than 40% bilateral ICA stenosis with a 50% right common carotid artery stenosis. She has had right carotid endarterectomy in the past. Her left lower extremity arterial Doppler study showed occluded posterior tibial arteries bilaterally with monophasic common femoral and SFA waveforms. She has leg cramps but really denies significant lifestyle limiting claudication.  Lorretta Harp, M.D., [place], [facility], [place] [address] [place], [place]  [address]  [phone] 05/08/2017 3:31 PM

## 2017-05-08 NOTE — Patient Instructions (Signed)

## 2017-05-08 NOTE — Assessment & Plan Note (Signed)
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

## 2017-05-08 NOTE — Assessment & Plan Note (Signed)
History of carotid artery disease with recent Doppler study showing less than 40% bilateral ICA stenosis. She has had a prior right carotid endarterectomy. There is a greater than 50% mid right common carotid artery stenosis. This will be repeated on an annual basis.

## 2017-05-13 DIAGNOSIS — Z23 Encounter for immunization: Secondary | ICD-10-CM | POA: Diagnosis not present

## 2017-05-13 DIAGNOSIS — R0602 Shortness of breath: Secondary | ICD-10-CM | POA: Diagnosis not present

## 2017-05-13 DIAGNOSIS — R194 Change in bowel habit: Secondary | ICD-10-CM | POA: Diagnosis not present

## 2017-05-14 ENCOUNTER — Other Ambulatory Visit (HOSPITAL_COMMUNITY): Payer: Self-pay | Admitting: Respiratory Therapy

## 2017-05-14 ENCOUNTER — Ambulatory Visit: Payer: Medicare Other | Admitting: Cardiovascular Disease

## 2017-05-14 DIAGNOSIS — R0602 Shortness of breath: Secondary | ICD-10-CM

## 2017-05-27 DIAGNOSIS — N390 Urinary tract infection, site not specified: Secondary | ICD-10-CM | POA: Diagnosis not present

## 2017-05-27 DIAGNOSIS — I10 Essential (primary) hypertension: Secondary | ICD-10-CM | POA: Diagnosis not present

## 2017-05-27 DIAGNOSIS — E782 Mixed hyperlipidemia: Secondary | ICD-10-CM | POA: Diagnosis not present

## 2017-05-27 DIAGNOSIS — N183 Chronic kidney disease, stage 3 (moderate): Secondary | ICD-10-CM | POA: Diagnosis not present

## 2017-05-27 DIAGNOSIS — Z0000 Encounter for general adult medical examination without abnormal findings: Secondary | ICD-10-CM | POA: Diagnosis not present

## 2017-05-27 DIAGNOSIS — I739 Peripheral vascular disease, unspecified: Secondary | ICD-10-CM | POA: Diagnosis not present

## 2017-05-28 ENCOUNTER — Ambulatory Visit (HOSPITAL_COMMUNITY)
Admission: RE | Admit: 2017-05-28 | Discharge: 2017-05-28 | Disposition: A | Discharge status: Home / Self Care | Payer: Medicare Other | Source: Ambulatory Visit | Attending: Internal Medicine | Admitting: Internal Medicine

## 2017-05-28 DIAGNOSIS — R0602 Shortness of breath: Secondary | ICD-10-CM | POA: Insufficient documentation

## 2017-05-31 LAB — PULMONARY FUNCTION TEST
DL/VA % pred: 30 %
DL/VA: 1.31 ml/min/mmHg/L
DLCO unc % pred: 25 %
DLCO unc: 4.84 ml/min/mmHg
FEF 25-75 Pre: 0.34 L/sec
FEF2575-%Pred-Pre: 20 %
FEV1-%Pred-Pre: 52 %
FEV1-Pre: 0.98 L
FEV1FVC-%Pred-Pre: 64 %
FEV6-%Pred-Pre: 79 %
FEV6-Pre: 1.87 L
FEV6FVC-%Pred-Pre: 98 %
FVC-%Pred-Pre: 80 %
FVC-Pre: 2 L
Pre FEV1/FVC ratio: 49 %
Pre FEV6/FVC Ratio: 94 %

## 2017-06-03 DIAGNOSIS — R7303 Prediabetes: Secondary | ICD-10-CM | POA: Diagnosis not present

## 2017-06-03 DIAGNOSIS — I129 Hypertensive chronic kidney disease with stage 1 through stage 4 chronic kidney disease, or unspecified chronic kidney disease: Secondary | ICD-10-CM | POA: Diagnosis not present

## 2017-06-03 DIAGNOSIS — E890 Postprocedural hypothyroidism: Secondary | ICD-10-CM | POA: Diagnosis not present

## 2017-06-03 DIAGNOSIS — E782 Mixed hyperlipidemia: Secondary | ICD-10-CM | POA: Diagnosis not present

## 2017-06-03 DIAGNOSIS — N183 Chronic kidney disease, stage 3 (moderate): Secondary | ICD-10-CM | POA: Diagnosis not present

## 2017-06-03 DIAGNOSIS — J449 Chronic obstructive pulmonary disease, unspecified: Secondary | ICD-10-CM | POA: Diagnosis not present

## 2017-06-03 DIAGNOSIS — I739 Peripheral vascular disease, unspecified: Secondary | ICD-10-CM | POA: Diagnosis not present

## 2017-06-24 ENCOUNTER — Other Ambulatory Visit: Payer: Self-pay | Admitting: *Deleted

## 2017-06-24 MED ORDER — CLOPIDOGREL BISULFATE 75 MG PO TABS: 75.0000 mg | ORAL_TABLET | Freq: Every day | ORAL | 3 refills | Status: DC

## 2017-06-25 ENCOUNTER — Inpatient Hospital Stay (HOSPITAL_COMMUNITY): Admission: RE | Admit: 2017-06-25 | Payer: Medicare Other | Source: Ambulatory Visit

## 2017-07-18 ENCOUNTER — Ambulatory Visit (HOSPITAL_COMMUNITY)
Admission: RE | Admit: 2017-07-18 | Discharge: 2017-07-18 | Disposition: A | Discharge status: Home / Self Care | Payer: Medicare Other | Source: Ambulatory Visit | Attending: Cardiovascular Disease | Admitting: Cardiovascular Disease

## 2017-07-18 DIAGNOSIS — N270 Small kidney, unilateral: Secondary | ICD-10-CM | POA: Insufficient documentation

## 2017-07-18 DIAGNOSIS — I701 Atherosclerosis of renal artery: Secondary | ICD-10-CM | POA: Insufficient documentation

## 2017-07-18 DIAGNOSIS — R93422 Abnormal radiologic findings on diagnostic imaging of left kidney: Secondary | ICD-10-CM | POA: Insufficient documentation

## 2017-07-29 ENCOUNTER — Other Ambulatory Visit: Payer: Self-pay | Admitting: Cardiovascular Disease

## 2017-07-29 DIAGNOSIS — I701 Atherosclerosis of renal artery: Secondary | ICD-10-CM

## 2017-08-04 ENCOUNTER — Other Ambulatory Visit: Payer: Self-pay | Admitting: General Surgery

## 2017-08-04 DIAGNOSIS — Z853 Personal history of malignant neoplasm of breast: Secondary | ICD-10-CM

## 2017-08-15 DIAGNOSIS — R0602 Shortness of breath: Secondary | ICD-10-CM | POA: Diagnosis not present

## 2017-08-15 DIAGNOSIS — J069 Acute upper respiratory infection, unspecified: Secondary | ICD-10-CM | POA: Diagnosis not present

## 2017-08-15 DIAGNOSIS — R6883 Chills (without fever): Secondary | ICD-10-CM | POA: Diagnosis not present

## 2017-08-15 DIAGNOSIS — J101 Influenza due to other identified influenza virus with other respiratory manifestations: Secondary | ICD-10-CM | POA: Diagnosis not present

## 2017-08-15 DIAGNOSIS — R51 Headache: Secondary | ICD-10-CM | POA: Diagnosis not present

## 2017-08-18 DIAGNOSIS — J101 Influenza due to other identified influenza virus with other respiratory manifestations: Secondary | ICD-10-CM | POA: Diagnosis not present

## 2017-08-18 DIAGNOSIS — J449 Chronic obstructive pulmonary disease, unspecified: Secondary | ICD-10-CM | POA: Diagnosis not present

## 2017-08-18 DIAGNOSIS — E039 Hypothyroidism, unspecified: Secondary | ICD-10-CM | POA: Diagnosis not present

## 2017-09-05 ENCOUNTER — Ambulatory Visit
Admission: RE | Admit: 2017-09-05 | Discharge: 2017-09-05 | Disposition: A | Discharge status: Home / Self Care | Payer: Medicare Other | Source: Ambulatory Visit | Attending: General Surgery | Admitting: General Surgery

## 2017-09-05 DIAGNOSIS — R922 Inconclusive mammogram: Secondary | ICD-10-CM | POA: Diagnosis not present

## 2017-09-05 DIAGNOSIS — Z853 Personal history of malignant neoplasm of breast: Secondary | ICD-10-CM

## 2017-09-05 HISTORY — DX: Personal history of irradiation: Z92.3

## 2017-09-05 HISTORY — DX: Malignant neoplasm of unspecified site of unspecified female breast: C50.919

## 2017-10-03 DIAGNOSIS — R300 Dysuria: Secondary | ICD-10-CM | POA: Diagnosis not present

## 2017-10-03 DIAGNOSIS — N390 Urinary tract infection, site not specified: Secondary | ICD-10-CM | POA: Diagnosis not present

## 2017-11-01 ENCOUNTER — Other Ambulatory Visit: Payer: Self-pay | Admitting: Hematology and Oncology

## 2017-12-04 ENCOUNTER — Telehealth: Payer: Self-pay | Admitting: Cardiovascular Disease

## 2017-12-04 NOTE — Telephone Encounter (Signed)
Spoke with Pt and informed that per Dr. Gwenlyn Found it doesn't sound vascular related. Advised pt to follow up with PCP. Pt verbalized understanding.

## 2017-12-04 NOTE — Telephone Encounter (Signed)
Pt states that she has been experiencing bilateral leg burning sensation that happens mostly at night x 1 week. She denies any swelling or redness but states the feeling is throughout her entire leg.  Routing to Dr. Gwenlyn Found for recommendation.

## 2017-12-04 NOTE — Telephone Encounter (Signed)
This does not sound ischemic.

## 2017-12-04 NOTE — Telephone Encounter (Signed)
New Message:       Pt states she is having a burning sensation in her legs and it's been going on for about a week now. Pt has no other symptoms.

## 2017-12-11 ENCOUNTER — Inpatient Hospital Stay: Payer: Medicare Other | Attending: Hematology and Oncology | Admitting: Hematology and Oncology

## 2017-12-11 ENCOUNTER — Telehealth: Payer: Self-pay | Admitting: Hematology and Oncology

## 2017-12-11 DIAGNOSIS — I701 Atherosclerosis of renal artery: Secondary | ICD-10-CM

## 2017-12-11 DIAGNOSIS — C50412 Malignant neoplasm of upper-outer quadrant of left female breast: Secondary | ICD-10-CM | POA: Insufficient documentation

## 2017-12-11 DIAGNOSIS — Z7982 Long term (current) use of aspirin: Secondary | ICD-10-CM | POA: Diagnosis not present

## 2017-12-11 DIAGNOSIS — I890 Lymphedema, not elsewhere classified: Secondary | ICD-10-CM | POA: Diagnosis not present

## 2017-12-11 DIAGNOSIS — Z79899 Other long term (current) drug therapy: Secondary | ICD-10-CM | POA: Insufficient documentation

## 2017-12-11 DIAGNOSIS — Z7902 Long term (current) use of antithrombotics/antiplatelets: Secondary | ICD-10-CM | POA: Diagnosis not present

## 2017-12-11 DIAGNOSIS — Z79810 Long term (current) use of selective estrogen receptor modulators (SERMs): Secondary | ICD-10-CM | POA: Insufficient documentation

## 2017-12-11 DIAGNOSIS — R232 Flushing: Secondary | ICD-10-CM | POA: Diagnosis not present

## 2017-12-11 DIAGNOSIS — Z923 Personal history of irradiation: Secondary | ICD-10-CM | POA: Insufficient documentation

## 2017-12-11 DIAGNOSIS — Z170 Estrogen receptor positive status [ER+]: Secondary | ICD-10-CM | POA: Diagnosis not present

## 2017-12-11 NOTE — Assessment & Plan Note (Signed)
Left lumpectomy 09/22/2015: IDC grade 1, 1.1 cm, ADH, LCIS, margins negative, 0/3 lymph nodes, ER 100%, PR 90%, HER-2 negative, Ki-67 10%, T1 cN0 stage IA, Oncotype DX score 14, 9% risk of recurrence Adjuvant radiation therapy 10/30/2015 to 11/28/2015  Treatment plan: Adjuvant antiestrogen therapy with tamoxifen 20 mg by mouth daily 5 years (chosen due to osteoporosis T score of -2.5 done in February 2017)  Tamoxifen Toxicities: 1. Very occasional hot flashes  Fullness in the left axilla and breast: Mild lymphedema Biopsy revealed fibrosis and inflammation  Surveillance: Mammograms 09/05/2017: No mammographic evidence of breast malignancy unchanged left breast skin thickening. Breast exam 12/11/2017: Benign with skin changes related to prior radiation  RTC in one year for follow up.

## 2017-12-11 NOTE — Telephone Encounter (Signed)
Gave patient AVs and calendar of upcoming may 2020 appointments °

## 2017-12-11 NOTE — Progress Notes (Signed)
Patient Care Team: Kendra Seashore, MD as PCP - General (Internal Medicine) Kendra Harp, MD as Attending Physician (Cardiology) Kendra Banister, MD (Gastroenterology) Kendra Posey, MD as Consulting Physician (Obstetrics and Gynecology) Kendra Cheese, NP as Nurse Practitioner (Hematology and Oncology)  DIAGNOSIS:  Encounter Diagnosis  Name Primary?  . Malignant neoplasm of upper-outer quadrant of left breast in female, estrogen receptor positive ([place])     SUMMARY OF ONCOLOGIC HISTORY:   Breast cancer of upper-outer quadrant of left female breast ([place])   08/30/2015 Imaging    DEXA scan: Normal       09/07/2015 Initial Diagnosis    Screening detected left breast mass at 1:00 position 4 x 3 x 4 mm biopsy grade 1 invasive ductal carcinoma ER 100% PR 90% positive HER-2 negative ratio 1.18 Ki-67 10%, T1 aN0 stage IA clinical stage      09/22/2015 Surgery    Left lumpectomy Kendra Watts): IDC grade 1, 1.1 minutes, ADH, LCIS, margins negative, 0/3 lymph nodes, ER 100%, PR 90%, HER-2 negative, Ki-67 10%, T1 cN0 stage IA, Oncotype DX score 14, 9% ROR      10/30/2015 - 11/28/2015 Radiation Therapy    Adjuvant XRT (Kendra Watts). Left breast: 42.72 Gy in 16 treatments.  Left breast "boost": 10 Gy in 5 treatments.  Total: 52.72 Gy in 21 treatments       11/2015 -  Anti-estrogen oral therapy    Tamoxifen '20mg'$  daily. Planned duration of treatment: 5 years Kendra Watts)       CHIEF COMPLIANT: Follow-up on tamoxifen therapy  INTERVAL HISTORY: Kendra Watts is a 72 year old with above-mentioned history of left breast cancer treated with lumpectomy radiation and is currently tamoxifen.  She appears to be tolerating tamoxifen extremely well.  She denies any hot flashes arthralgias or myalgias.  Denies any lumps or nodules in the breast.  She has left breast lymphedema which appears to be slowly getting worse  REVIEW OF SYSTEMS:   Constitutional: Denies fevers, chills or abnormal weight  loss Eyes: Denies blurriness of vision Ears, nose, mouth, throat, and face: Denies mucositis or sore throat Respiratory: Denies cough, dyspnea or wheezes Cardiovascular: Denies palpitation, chest discomfort Gastrointestinal:  Denies nausea, heartburn or change in bowel habits Skin: Denies abnormal skin rashes Lymphatics: Denies new lymphadenopathy or easy bruising Neurological:Denies numbness, tingling or new weaknesses Behavioral/Psych: Mood is stable, no new changes  Extremities: No lower extremity edema Breast: Left breast lymphedema All other systems were reviewed with the patient and are negative.  I have reviewed the past medical history, past surgical history, social history and family history with the patient and they are unchanged from previous note.  ALLERGIES:  has No Known Allergies.  MEDICATIONS:  Current Outpatient Medications  Medication Sig Dispense Refill  . amLODipine (NORVASC) 10 MG tablet Take 10 mg by mouth every morning.    Marland Kitchen aspirin 81 MG tablet Take 162 mg by mouth daily.    . calcium citrate (CALCITRATE - DOSED IN MG ELEMENTAL CALCIUM) 950 MG tablet Take 1,200 mg of elemental calcium by mouth daily.    . clopidogrel (PLAVIX) 75 MG tablet Take 1 tablet (75 mg total) by mouth daily. 90 tablet 3  . doxazosin (CARDURA) 8 MG tablet Take 8 mg by mouth every morning.      Marland Kitchen levothyroxine (SYNTHROID, LEVOTHROID) 88 MCG tablet Take 88 mcg by mouth daily before breakfast.    . losartan (COZAAR) 100 MG tablet Take 100 mg by mouth every morning.     Marland Kitchen  Multiple Vitamins-Minerals (MULTIVITAMIN WITH MINERALS) tablet Take 1 tablet by mouth every morning.     . rosuvastatin (CRESTOR) 10 MG tablet Take 10 mg by mouth every morning.     . tamoxifen (NOLVADEX) 20 MG tablet TAKE 1 TABLET BY MOUTH  DAILY 90 tablet 3   No current facility-administered medications for this visit.     PHYSICAL EXAMINATION: ECOG PERFORMANCE STATUS: 1 - Symptomatic but completely  ambulatory  Vitals:   12/11/17 1350  BP: (!) 109/91  Pulse: 88  Resp: 18  Temp: 97.8 F (36.6 C)  SpO2: 96%   Filed Weights   12/11/17 1350  Weight: 86 lb 11.2 oz (39.3 kg)    GENERAL:alert, no distress and comfortable SKIN: skin color, texture, turgor are normal, no rashes or significant lesions EYES: normal, Conjunctiva are pink and non-injected, sclera clear OROPHARYNX:no exudate, no erythema and lips, buccal mucosa, and tongue normal  NECK: supple, thyroid normal size, non-tender, without nodularity LYMPH:  no palpable lymphadenopathy in the cervical, axillary or inguinal LUNGS: clear to auscultation and percussion with normal breathing effort HEART: regular rate & rhythm and no murmurs and no lower extremity edema ABDOMEN:abdomen soft, non-tender and normal bowel sounds MUSCULOSKELETAL:no cyanosis of digits and no clubbing  NEURO: alert & oriented x 3 with fluent speech, no focal motor/sensory deficits EXTREMITIES: No lower extremity edema BREAST: No palpable masses or nodules in either right or left breasts. No palpable axillary supraclavicular or infraclavicular adenopathy.  Left breast lymphedema (exam performed in the presence of a chaperone)  LABORATORY DATA:  I have reviewed the data as listed CMP Latest Ref Rng & Units 04/23/2017 09/13/2015 08/23/2014  Glucose 70 - 140 mg/dl - 157(H) 95  BUN 7.0 - 26.0 mg/dL - 14.8 13  Creatinine 0.6 - 1.1 mg/dL - 1.0 0.94  Sodium 136 - 145 mEq/L - 139 139  Potassium 3.5 - 5.1 mEq/L - 4.1 3.4(L)  Chloride 96 - 112 mmol/L - - 107  CO2 22 - 29 mEq/L - 22 23  Calcium 8.4 - 10.4 mg/dL - 9.8 8.7  Total Protein 6.0 - 8.5 g/dL 6.8 6.9 -  Total Bilirubin 0.0 - 1.2 mg/dL 0.5 0.37 -  Alkaline Phos 39 - 117 IU/L 58 65 -  AST 0 - 40 IU/L 22 19 -  ALT 0 - 32 IU/L 15 16 -    Lab Results  Component Value Date   WBC 7.4 09/13/2015   HGB 14.4 09/13/2015   HCT 43.9 09/13/2015   MCV 96.3 09/13/2015   PLT 212 09/13/2015   NEUTROABS 5.6  09/13/2015    ASSESSMENT & PLAN:  Breast cancer of upper-outer quadrant of left female breast ([place]) Left lumpectomy 09/22/2015: IDC grade 1, 1.1 cm, ADH, LCIS, margins negative, 0/3 lymph nodes, ER 100%, PR 90%, HER-2 negative, Ki-67 10%, T1 cN0 stage IA, Oncotype DX score 14, 9% risk of recurrence Adjuvant radiation therapy 10/30/2015 to 11/28/2015  Treatment plan: Adjuvant antiestrogen therapy with tamoxifen 20 mg by mouth daily 5 years (chosen due to osteoporosis T score of -2.5 done in February 2017)  Tamoxifen Toxicities: 1. Very occasional hot flashes  Fullness in the left axilla and breast: Mild lymphedema Biopsy revealed fibrosis and inflammation  Surveillance: Mammograms 09/05/2017: No mammographic evidence of breast malignancy unchanged left breast skin thickening. Breast exam 12/11/2017: Benign with skin changes related to prior radiation  Left breast lymphedema: I discussed with her about manual lymph drainage.  She has exercises prescribed by our physical therapist.  She  will start doing those. RTC in one year for follow up.  No orders of the defined types were placed in this encounter.  The patient has a good understanding of the overall plan. she agrees with it. she will call with any problems that may develop before the next visit here.   Harriette Ohara, MD 12/11/17

## 2017-12-17 DIAGNOSIS — E782 Mixed hyperlipidemia: Secondary | ICD-10-CM | POA: Diagnosis not present

## 2017-12-17 DIAGNOSIS — I739 Peripheral vascular disease, unspecified: Secondary | ICD-10-CM | POA: Diagnosis not present

## 2017-12-17 DIAGNOSIS — I129 Hypertensive chronic kidney disease with stage 1 through stage 4 chronic kidney disease, or unspecified chronic kidney disease: Secondary | ICD-10-CM | POA: Diagnosis not present

## 2017-12-19 ENCOUNTER — Ambulatory Visit: Payer: Medicare Other | Admitting: Cardiology

## 2017-12-24 DIAGNOSIS — R7303 Prediabetes: Secondary | ICD-10-CM | POA: Diagnosis not present

## 2017-12-24 DIAGNOSIS — I739 Peripheral vascular disease, unspecified: Secondary | ICD-10-CM | POA: Diagnosis not present

## 2017-12-24 DIAGNOSIS — N183 Chronic kidney disease, stage 3 (moderate): Secondary | ICD-10-CM | POA: Diagnosis not present

## 2017-12-24 DIAGNOSIS — J449 Chronic obstructive pulmonary disease, unspecified: Secondary | ICD-10-CM | POA: Diagnosis not present

## 2017-12-24 DIAGNOSIS — I129 Hypertensive chronic kidney disease with stage 1 through stage 4 chronic kidney disease, or unspecified chronic kidney disease: Secondary | ICD-10-CM | POA: Diagnosis not present

## 2017-12-24 DIAGNOSIS — E782 Mixed hyperlipidemia: Secondary | ICD-10-CM | POA: Diagnosis not present

## 2018-01-28 DIAGNOSIS — H6120 Impacted cerumen, unspecified ear: Secondary | ICD-10-CM | POA: Diagnosis not present

## 2018-01-28 DIAGNOSIS — J449 Chronic obstructive pulmonary disease, unspecified: Secondary | ICD-10-CM | POA: Diagnosis not present

## 2018-01-28 DIAGNOSIS — I129 Hypertensive chronic kidney disease with stage 1 through stage 4 chronic kidney disease, or unspecified chronic kidney disease: Secondary | ICD-10-CM | POA: Diagnosis not present

## 2018-03-14 DIAGNOSIS — K649 Unspecified hemorrhoids: Secondary | ICD-10-CM | POA: Diagnosis not present

## 2018-03-14 DIAGNOSIS — K611 Rectal abscess: Secondary | ICD-10-CM | POA: Diagnosis not present

## 2018-03-31 ENCOUNTER — Encounter: Payer: Self-pay | Admitting: Cardiovascular Disease

## 2018-03-31 ENCOUNTER — Ambulatory Visit (INDEPENDENT_AMBULATORY_CARE_PROVIDER_SITE_OTHER): Payer: Medicare Other | Admitting: Cardiovascular Disease

## 2018-03-31 VITALS — BP 94/74 | HR 99 | Ht 60.0 in | Wt 87.8 lb

## 2018-03-31 DIAGNOSIS — I6523 Occlusion and stenosis of bilateral carotid arteries: Secondary | ICD-10-CM

## 2018-03-31 DIAGNOSIS — E7800 Pure hypercholesterolemia, unspecified: Secondary | ICD-10-CM | POA: Diagnosis not present

## 2018-03-31 DIAGNOSIS — I701 Atherosclerosis of renal artery: Secondary | ICD-10-CM

## 2018-03-31 DIAGNOSIS — Z720 Tobacco use: Secondary | ICD-10-CM | POA: Diagnosis not present

## 2018-03-31 DIAGNOSIS — I10 Essential (primary) hypertension: Secondary | ICD-10-CM

## 2018-03-31 NOTE — Progress Notes (Signed)
03/31/2018 Kendra [facility]   1946-01-13  [id number]  Primary Physician Kendra Seashore, MD Primary Cardiologist: Kendra Harp MD Kendra Watts, [place], [place]  HPI:  Kendra Watts is a 72 y.o.  hin-appearing divorced Caucasian female with no children who I last saw on  04/08/2017... 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 She has a history of continued tobacco abuse at 1 pack per day, hypertension and hyperlipidemia with a strong family history for heart disease. Her mother had her first MI at age 72 and died at age 48. I have angiogrammed her revealing a tight right internal carotid artery stenosis, as well as a right subclavian artery stenosis. 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 I stented her left renal artery and she has moderate bilateral iliac disease but really denies claudication.She did had elective right carotid endarterectomy performed by Kendra Watts April 02, 2012.since I saw her a year ago she's been remarkably stable. Carotid Dopplers performed /23/15 revealed a widely patent right carotid endarterectomy site and minimal left carotid disease. Renal Dopplers social progression of disease in the right renal artery. Her left kidney continues to diminish in size despite a patent stent. Recent renal Dopplers performed in April show progression of disease on the right as well.Lower extremity arterial Dopplers to show progression of disease in the iliac arteries as she denies claudication.she denies chest pain or shortness of breath. 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 Follow-up renal Doppler studies performed on 05/19/14 revealed progression of disease in her right renal artery with a renal aortic ratio that went from 6.34-7.82. I'm concerned that she has progression of disease in her only functioning kidney. I performed angiography on her  07/04/14 via the left brachial approach revealing occluded left renal artery stent and 90% calcified ostial downgoing right renal artery. I decided not to intervene at that time. I'm slightly concerned about the technical difficulties with an ostial calcified lesion with regard to dissection and/or rupture. Her only other option would be renal bypass surgery. I performed percutaneous revascularization on her right renal artery ostia on 08/22/14 via the left brachial approach reducing a 95% calcified ostial right renal artery stenosis and a solitary kidney to 0% residual with an ICast covered Stent. Her most recent renal Doppler study performed 06/07/15 revealed this to be widely patent. Since I saw her a year ago she's remained asymptomatic. She denies chest pain, shortness of breath. She does have some pain in her legs which sound like restless leg syndrome. She had ductal breast carcinoma with lumpectomy performed by Dr. [name] 09/22/15 with subsequent radiation therapy. She did not require chemotherapy. Since I saw her last she's noticed some discomfort in her left wrist when she walks as well as tingling. 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  Since I saw her in the office 1 year ago, she is remained stable.  She does continue to smoke a pack a day.  She denies chest pain, shortness of breath or claudication.  Her most recent renal Dopplers revealed her right renal artery stent to be widely patent and her renal function is normal as well.    Current Meds  Medication Sig  . amLODipine (NORVASC) 10 MG tablet Take 10 mg by mouth every morning.  Marland Kitchen aspirin 81 MG tablet Take 81  mg by mouth daily.  . calcium citrate (CALCITRATE - DOSED IN MG ELEMENTAL CALCIUM) 950 MG tablet Take 1,200 mg of elemental calcium by mouth daily.  . clopidogrel (PLAVIX) 75 MG tablet Take 1 tablet (75 mg total) by mouth daily.  Marland Kitchen doxazosin (CARDURA) 8 MG tablet Take 8 mg by mouth every  morning.    Marland Kitchen levothyroxine (SYNTHROID, LEVOTHROID) 88 MCG tablet Take 88 mcg by mouth daily before breakfast.  . losartan (COZAAR) 100 MG tablet Take 100 mg by mouth every morning.   . Multiple Vitamins-Minerals (MULTIVITAMIN WITH MINERALS) tablet Take 1 tablet by mouth every morning.   . rosuvastatin (CRESTOR) 10 MG tablet Take 10 mg by mouth every morning.   . tamoxifen (NOLVADEX) 20 MG tablet TAKE 1 TABLET BY MOUTH  DAILY     No Known Allergies  Social History   Socioeconomic History  . Marital status: Divorced    Spouse name: Not on file  . Number of children: 0  . Years of education: Not on file  . Highest education level: Not on file  Occupational History  . 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 Financial resource strain: Not on file  . Food insecurity:    Worry: Not on file    Inability: Not on file  . Transportation needs:    Medical: Not on file    Non-medical: Not on file  Tobacco Use  . Smoking status: Current Every Day Smoker    Packs/day: 1.50    Years: 40.00    Pack years: 60.00    Types: Cigarettes  . Smokeless tobacco: Never Used  Substance and Sexual Activity  . Alcohol use: Yes    Comment: occasionally  . Drug use: No  . Sexual activity: Never  Lifestyle  . Physical activity:    Days per week: Not on file    Minutes per session: Not on file  . Stress: Not on file  Relationships  . Social connections:    Talks on phone: Not on file    Gets together: Not on file    Attends religious service: Not on file    Active member of club or organization: Not on file    Attends meetings of clubs or organizations: Not on file    Relationship status: Not on file  . Intimate partner violence:    Fear of current or ex partner: Not on file    Emotionally abused: Not on file    Physically abused: Not on file    Forced sexual activity: Not on file  Other Topics Concern  . Not on file  Social History Narrative  . Not on  file     Review of Systems: General: negative for chills, fever, night sweats or weight changes.  Cardiovascular: negative for chest pain, dyspnea on exertion, edema, orthopnea, palpitations, paroxysmal nocturnal dyspnea or shortness of breath Dermatological: negative for rash Respiratory: negative for cough or wheezing Urologic: negative for hematuria Abdominal: negative for nausea, vomiting, diarrhea, bright red blood per rectum, melena, or hematemesis Neurologic: negative for visual changes, syncope, or dizziness All other systems reviewed and are otherwise negative except as noted above.    Blood pressure 94/74, pulse 99, height 5' (1.524 m), weight 87 lb 12.8 oz (39.8 kg).  General appearance: alert and no distress Neck: no adenopathy, no JVD, supple, symmetrical, trachea midline, thyroid not enlarged, symmetric, no tenderness/mass/nodules and Soft bilateral carotid bruits Lungs: clear to auscultation bilaterally Heart:  regular rate and rhythm, S1, S2 normal, no murmur, click, rub or gallop Extremities: extremities normal, atraumatic, no cyanosis or edema Pulses: 2+ and symmetric Skin: Skin color, texture, turgor normal. No rashes or lesions Neurologic: Alert and oriented X 3, normal strength and tone. Normal symmetric reflexes. Normal coordination and gait  EKG sinus rhythm at 99 with septal Q waves and low limb voltage with left atrial enlargement.  I personally reviewed this EKG.  ASSESSMENT AND PLAN:   Carotid artery stenosis History of carotid artery disease status post elective right carotid endarterectomy performed by Kendra Watts 04/02/2012 which we followed by duplex ultrasound.  Carotid Dopplers performed 04/24/2017 revealed widely patent carotid arteries.  Atherosclerotic renal artery stenosis, bilateral She of bilateral renal artery stenosis with a known occluded left renal artery high-grade calcified downgoing right renal artery stenosis which I performed PTA and  stenting on 08/22/2014.  Recent renal Dopplers revealed this to be widely patent.  Her blood pressure is well controlled and her renal function is normal.  Peripheral arterial disease History of peripheral arterial disease with Dopplers show iliac disease although she denies claudication.  Essential hypertension History of essential hypertension her blood pressure measured today 94/74.  She is on amlodipine.  Continue current meds at current dosing.  Hyperlipidemia History of hyperlipidemia on statin therapy.  Most recent lipid profile performed 12/17/2017 revealed total cholesterol 145, LDL 48 and HDL of 90.  Tobacco abuse History of ongoing tobacco abuse of 1 pack/day recalcitrant risk factor modification.      Kendra Harp MD FACP,FACC,FAHA, [facility] 03/31/2018 1:51 PM

## 2018-03-31 NOTE — Assessment & Plan Note (Addendum)
She of bilateral renal artery stenosis with a known occluded left renal artery high-grade calcified downgoing right renal artery stenosis which I performed PTA and stenting on 08/22/2014.  Recent renal Dopplers revealed this to be widely patent.  Her blood pressure is well controlled and her renal function is normal.

## 2018-03-31 NOTE — Assessment & Plan Note (Signed)
History of hyperlipidemia on statin therapy.  Most recent lipid profile performed 12/17/2017 revealed total cholesterol 145, LDL 48 and HDL of 90.

## 2018-03-31 NOTE — Assessment & Plan Note (Signed)
History of essential hypertension her blood pressure measured today 94/74.  She is on amlodipine.  Continue current meds at current dosing.

## 2018-03-31 NOTE — Assessment & Plan Note (Signed)
History of carotid artery disease status post elective right carotid endarterectomy performed by Dr. Trula Slade 04/02/2012 which we followed by duplex ultrasound.  Carotid Dopplers performed 04/24/2017 revealed widely patent carotid arteries.

## 2018-03-31 NOTE — Assessment & Plan Note (Signed)
History of ongoing tobacco abuse of 1 pack/day recalcitrant risk factor modification.

## 2018-03-31 NOTE — Assessment & Plan Note (Signed)
History of peripheral arterial disease with Dopplers show iliac disease although she denies claudication.

## 2018-03-31 NOTE — Patient Instructions (Addendum)
Medication Instructions:  Your physician has recommended you make the following change in your medication:  1) DECREASE Asprin to 81 mg tablet by mouth ONCE daily   Labwork: none  Testing/Procedures: Your physician has requested that you have a carotid duplex. This test is an ultrasound of the carotid arteries in your neck. It looks at blood flow through these arteries that supply the brain with blood. Allow one hour for this exam. There are no restrictions or special instructions. SCHEDULE FOR OCT.2019    Follow-Up: Your physician wants you to follow-up in: 12 months with Dr. Gwenlyn Found. You will receive a reminder letter in the mail two months in advance. If you don't receive a letter, please call our office to schedule the follow-up appointment.   Any Other Special Instructions Will Be Listed Below (If Applicable).     If you need a refill on your cardiac medications before your next appointment, please call your pharmacy.

## 2018-04-12 DIAGNOSIS — G501 Atypical facial pain: Secondary | ICD-10-CM | POA: Diagnosis not present

## 2018-04-12 DIAGNOSIS — K047 Periapical abscess without sinus: Secondary | ICD-10-CM | POA: Diagnosis not present

## 2018-04-12 DIAGNOSIS — K0889 Other specified disorders of teeth and supporting structures: Secondary | ICD-10-CM | POA: Diagnosis not present

## 2018-04-23 ENCOUNTER — Telehealth: Payer: Self-pay | Admitting: Cardiovascular Disease

## 2018-04-23 NOTE — Telephone Encounter (Signed)
New Message      [facility] Pre-operative Risk Assessment    Request for surgical clearance:  1. What type of surgery is being performed? epicone epitome dental procedure  2. When is this surgery scheduled? 05/06/2018  3. What type of clearance is required (medical clearance vs. Pharmacy clearance to hold med vs. Both)? Both  4. Are there any medications that need to be held prior to surgery and how long?Hold Plavix one day before procedure   5. Practice name and name of physician performing surgery? [facility] Dr. Sue Lush  6. What is your office phone number [phone]   7.   What is your office fax number [phone]  8.   Anesthesia type (None, local, MAC, general) ? Local    Avaletta L Williams 04/23/2018, 11:29 AM  _________________________________________________________________   (provider comments below)

## 2018-04-24 NOTE — Telephone Encounter (Signed)
   Primary Cardiologist: No primary care provider on file.  Chart reviewed as part of pre-operative protocol coverage. Given past medical history and time since last visit, based on ACC/AHA guidelines, Kendra Watts would be at acceptable risk for the planned procedure without further cardiovascular testing.   Per Dr. Gwenlyn Found, okay to hold Plavix up to seven days prior to procedure.  I will route this recommendation to the requesting party via Epic fax function and remove from pre-op pool.  Please call with questions.  Kathyrn Drown, NP 04/24/2018, 12:32 PM

## 2018-04-27 ENCOUNTER — Inpatient Hospital Stay (HOSPITAL_COMMUNITY): Admission: RE | Admit: 2018-04-27 | Payer: Medicare Other | Source: Ambulatory Visit

## 2018-04-30 ENCOUNTER — Ambulatory Visit (HOSPITAL_BASED_OUTPATIENT_CLINIC_OR_DEPARTMENT_OTHER)
Admission: RE | Admit: 2018-04-30 | Discharge: 2018-04-30 | Disposition: A | Discharge status: Home / Self Care | Payer: Medicare Other | Source: Ambulatory Visit | Attending: Cardiovascular Disease | Admitting: Cardiovascular Disease

## 2018-04-30 ENCOUNTER — Ambulatory Visit (HOSPITAL_COMMUNITY)
Admission: RE | Admit: 2018-04-30 | Discharge: 2018-04-30 | Disposition: A | Discharge status: Home / Self Care | Payer: Medicare Other | Source: Ambulatory Visit | Attending: Internal Medicine | Admitting: Internal Medicine

## 2018-04-30 DIAGNOSIS — I10 Essential (primary) hypertension: Secondary | ICD-10-CM | POA: Insufficient documentation

## 2018-04-30 DIAGNOSIS — I6523 Occlusion and stenosis of bilateral carotid arteries: Secondary | ICD-10-CM | POA: Insufficient documentation

## 2018-04-30 DIAGNOSIS — I739 Peripheral vascular disease, unspecified: Secondary | ICD-10-CM | POA: Diagnosis not present

## 2018-05-04 ENCOUNTER — Other Ambulatory Visit: Payer: Self-pay | Admitting: *Deleted

## 2018-05-04 DIAGNOSIS — I6523 Occlusion and stenosis of bilateral carotid arteries: Secondary | ICD-10-CM

## 2018-05-04 DIAGNOSIS — I739 Peripheral vascular disease, unspecified: Secondary | ICD-10-CM

## 2018-05-05 ENCOUNTER — Other Ambulatory Visit: Payer: Self-pay | Admitting: *Deleted

## 2018-05-05 DIAGNOSIS — I739 Peripheral vascular disease, unspecified: Secondary | ICD-10-CM

## 2018-06-04 DIAGNOSIS — D0461 Carcinoma in situ of skin of right upper limb, including shoulder: Secondary | ICD-10-CM | POA: Diagnosis not present

## 2018-06-04 DIAGNOSIS — Z85828 Personal history of other malignant neoplasm of skin: Secondary | ICD-10-CM | POA: Diagnosis not present

## 2018-06-04 DIAGNOSIS — L821 Other seborrheic keratosis: Secondary | ICD-10-CM | POA: Diagnosis not present

## 2018-06-04 DIAGNOSIS — D485 Neoplasm of uncertain behavior of skin: Secondary | ICD-10-CM | POA: Diagnosis not present

## 2018-06-10 DIAGNOSIS — H31003 Unspecified chorioretinal scars, bilateral: Secondary | ICD-10-CM | POA: Diagnosis not present

## 2018-06-10 DIAGNOSIS — H52203 Unspecified astigmatism, bilateral: Secondary | ICD-10-CM | POA: Diagnosis not present

## 2018-06-10 DIAGNOSIS — H43813 Vitreous degeneration, bilateral: Secondary | ICD-10-CM | POA: Diagnosis not present

## 2018-06-10 DIAGNOSIS — H25813 Combined forms of age-related cataract, bilateral: Secondary | ICD-10-CM | POA: Diagnosis not present

## 2018-06-11 ENCOUNTER — Emergency Department (HOSPITAL_COMMUNITY)
Admission: EM | Admit: 2018-06-11 | Discharge: 2018-06-11 | Disposition: A | Discharge status: Home / Self Care | Payer: Medicare Other | Attending: Emergency Medicine | Admitting: Emergency Medicine

## 2018-06-11 ENCOUNTER — Other Ambulatory Visit: Payer: Self-pay

## 2018-06-11 ENCOUNTER — Encounter (HOSPITAL_COMMUNITY): Payer: Self-pay | Admitting: Emergency Medicine

## 2018-06-11 DIAGNOSIS — R040 Epistaxis: Secondary | ICD-10-CM

## 2018-06-11 DIAGNOSIS — I10 Essential (primary) hypertension: Secondary | ICD-10-CM | POA: Diagnosis not present

## 2018-06-11 DIAGNOSIS — F17210 Nicotine dependence, cigarettes, uncomplicated: Secondary | ICD-10-CM | POA: Diagnosis not present

## 2018-06-11 DIAGNOSIS — E039 Hypothyroidism, unspecified: Secondary | ICD-10-CM | POA: Insufficient documentation

## 2018-06-11 DIAGNOSIS — Z79899 Other long term (current) drug therapy: Secondary | ICD-10-CM | POA: Diagnosis not present

## 2018-06-11 LAB — CBC
HCT: 32.9 % — ABNORMAL LOW (ref 36.0–46.0)
Hemoglobin: 11.1 g/dL — ABNORMAL LOW (ref 12.0–15.0)
MCH: 31.8 pg (ref 26.0–34.0)
MCHC: 33.7 g/dL (ref 30.0–36.0)
MCV: 94.3 fL (ref 80.0–100.0)
Platelets: 207 10*3/uL (ref 150–400)
RBC: 3.49 MIL/uL — ABNORMAL LOW (ref 3.87–5.11)
RDW: 14.7 % (ref 11.5–15.5)
WBC: 10.3 10*3/uL (ref 4.0–10.5)
nRBC: 0 % (ref 0.0–0.2)

## 2018-06-11 MED ORDER — OXYMETAZOLINE HCL 0.05 % NA SOLN
1.0000 | Freq: Once | NASAL | Status: AC
  Administered 2018-06-11: 1 via NASAL
  Filled 2018-06-11: qty 15

## 2018-06-11 NOTE — Discharge Instructions (Signed)
Have the packing taken out in 2 to 3 days.  You can come back to the ER, an urgent care, or ENT.

## 2018-06-11 NOTE — ED Provider Notes (Signed)
[place] EMERGENCY DEPARTMENT Provider Note   CSN: [id number] Arrival date & time: 06/11/18  1920     History   Chief Complaint Chief Complaint  Patient presents with  . Epistaxis    HPI Kendra Watts is a 72 y.o. female.  HPI Patient presented with epistaxis.  Began earlier today.,  Mostly out of left nostril but has had some down the back of her throat and right nostril.  Seen at urgent care and given Afrin and silver nitrate but the silver nitrate was only done on the right side.  States she feels a little weak all over.  She is on Plavix. Past Medical History:  Diagnosis Date  . 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 Breast cancer ([place]) 09/22/2015   Malignant  . Breast cancer of upper-outer quadrant of left female breast ([place]) 09/08/2015  . Cataract, immature   . Heart murmur    states no known problems  . History of hyperthyroidism   . Hyperlipidemia   . Hypertension    states under control with meds., has been on med. x "long time"  . Hypothyroidism   . Nonfunctioning kidney    left  . Personal history of radiation therapy    2017  . Radiation 10/30/15-11/28/15   left breast 42.72 Gy, boosted to 10 Gy  . Renal artery stenosis ([place])   . Tobacco abuse   . Wears partial dentures    upper and lower    Patient Active Problem List   Diagnosis Date Noted  . Breast cancer of upper-outer quadrant of left female breast ([place]) 09/08/2015  . Renal artery stenosis ([place]) 08/23/2014  . Right upper extremity numbness 07/01/2013  . Atherosclerotic renal artery stenosis, bilateral ([place]) 05/26/2013  . Peripheral arterial disease () 05/26/2013  . Essential hypertension 05/26/2013  . Hyperlipidemia 05/26/2013  . Tobacco abuse 05/26/2013  . Carotid artery stenosis 03/09/2012    Past Surgical History:  Procedure Laterality Date  . ABDOMINAL ANGIOGRAM  02/18/2012   Procedure: ABDOMINAL ANGIOGRAM;  Surgeon: Lorretta Harp, MD;  Location: [facility] CATH  LAB;  Service: Cardiovascular;;  . ABDOMINAL AORTAGRAM  07/04/2014  . ABDOMINAL HYSTERECTOMY  ~ 1977   partial  . APPENDECTOMY    . ARCH AORTOGRAM    . BREAST LUMPECTOMY Left 09/22/2015   Malignant  . CAROTID ANGIOGRAM N/A 02/18/2012   Procedure: CAROTID ANGIOGRAM;  Surgeon: Lorretta Harp, MD;  Location: [facility] C[facility] LAB;  Service: Cardiovascular;  Laterality: N/A;  . ENDARTERECTOMY  04/02/2012   Procedure: ENDARTERECTOMY CAROTID;  Surgeon: Serafina Mitchell, MD;  Location[facility];  Service: Vascular;  Laterality: Right;  . RADIOACTIVE SEED GUIDED PARTIAL MASTECTOMY WITH AXILLARY SENTINEL LYMPH NODE BIOPSY Left 09/22/2015   Procedure: INJECT BLUE DYE LEFT BREAST,RADIOACTIVE SEED GUIDED PARTIAL MASTECTOMY WITH AXILLARY SENTINEL LYMPH NODE BIOPSY;  Surgeon: Fanny Skates, MD;  Location: [place];  Service: General;  Laterality: Left;  . RENAL ANGIOGRAM Left 06/08/2010   renal artery stent -  5x12 Genesis on Aviator balloon stent (Dr. Adora Fridge)  . RENAL ANGIOGRAM Right 07/04/2014   Procedure: RENAL ANGIOGRAM;  Surgeon: Lorretta Harp, MD;  Location: [facility] CATH LAB;  Service: Cardiovascular;  Laterality: Right;  . RENAL ANGIOGRAM Right 08/22/2014   Procedure: RENAL ANGIOGRAM;  Surgeon: Lorretta Harp, MD;  Location: [facility] CATH LAB;  Service: Cardiovascular;  Laterality: Right;  . TONSILLECTOMY     as a child  OB History   None      Home Medications    Prior to Admission medications   Medication Sig Start Date End Date Taking? Authorizing Provider  amLODipine (NORVASC) 10 MG tablet Take 10 mg by mouth every morning.    [provider]  aspirin 81 MG tablet Take 81 mg by mouth daily.    [provider]  calcium citrate (CALCITRATE - DOSED IN MG ELEMENTAL CALCIUM) 950 MG tablet Take 1,200 mg of elemental calcium by mouth daily.    [provider]  clopidogrel (PLAVIX) 75 MG tablet Take 1 tablet (75 mg total) by mouth daily. 06/24/17   Lorretta Harp,  MD  doxazosin (CARDURA) 8 MG tablet Take 8 mg by mouth every morning.      [provider]  levothyroxine (SYNTHROID, LEVOTHROID) 88 MCG tablet Take 88 mcg by mouth daily before breakfast.    [provider]  losartan (COZAAR) 100 MG tablet Take 100 mg by mouth every morning.  04/12/14   [provider]  Multiple Vitamins-Minerals (MULTIVITAMIN WITH MINERALS) tablet Take 1 tablet by mouth every morning.     [provider]  rosuvastatin (CRESTOR) 10 MG tablet Take 10 mg by mouth every morning.     [provider]  tamoxifen (NOLVADEX) 20 MG tablet TAKE 1 TABLET BY MOUTH  DAILY 11/03/17   Nicholas Lose, MD    Family History Family History  Problem Relation Age of Onset  . Heart disease Mother        MI @ 51, died at 9  . Cancer Father   . Lung cancer Father   . Heart disease Maternal Grandmother   . Lung cancer Sister   . Breast cancer Paternal Aunt     Social History Social History   Tobacco Use  . Smoking status: Current Every Day Smoker    Packs/day: 1.50    Years: 40.00    Pack years: 60.00    Types: Cigarettes  . Smokeless tobacco: Never Used  Substance Use Topics  . Alcohol use: Yes    Comment: occasionally  . Drug use: No     Allergies   Patient has no known allergies.   Review of Systems Review of Systems  Constitutional: Negative for appetite change.  HENT: Positive for nosebleeds.   Respiratory: Negative for shortness of breath.   Cardiovascular: Negative for chest pain.  Neurological: Negative for weakness.     Physical Exam Updated Vital Signs BP 96/74 (BP Location: Right Arm)   Pulse (!) 105   Temp 98.1 F (36.7 C) (Oral)   Resp 16   Ht 5' (1.524 m)   Wt 39.9 kg   SpO2 93%   BMI 17.19 kg/m   Physical Exam  Constitutional: She appears well-developed.  HENT:  Area of recent cauterization in the right nostril.  In the left side nostril appears to be some mild active bleeding on the lateral aspect.   Cardiovascular: Normal rate.  Neurological: She is alert.  Skin: Skin is warm. Capillary refill takes less than 2 seconds.     ED Treatments / Results  Labs (all labs ordered are listed, but only abnormal results are displayed) Labs Reviewed  CBC - Abnormal; Notable for the following components:      Result Value   RBC 3.49 (*)    Hemoglobin 11.1 (*)    HCT 32.9 (*)    All other components within normal limits    EKG None  Radiology  No results found.  Procedures .Epistaxis Management Date/Time: 06/11/2018 11:28 PM Performed by: Davonna Belling, MD Authorized by: Davonna Belling, MD   Consent:    Consent obtained:  Verbal   Consent given by:  Patient   Risks discussed:  Bleeding, infection and nasal injury   Alternatives discussed:  No treatment, delayed treatment and alternative treatment Anesthesia (see MAR for exact dosages):    Anesthesia method:  None Procedure details:    Treatment site:  L anterior   Treatment method:  Nasal balloon   Treatment complexity:  Limited   Treatment episode: initial   Post-procedure details:    Assessment:  Bleeding stopped   Patient tolerance of procedure:  Tolerated well, no immediate complications   (including critical care time)  Medications Ordered in ED Medications  oxymetazoline (AFRIN) 0.05 % nasal spray 1 spray (1 spray Each Nare Given 06/11/18 2016)     Initial Impression / Assessment and Plan / ED Course  I have reviewed the triage vital signs and the nursing notes.  Pertinent labs & imaging results that were available during my care of the patient were reviewed by me and considered in my medical decision making (see chart for details).     Patient with epistaxis.  Nasal balloon placed with control bleeding.  Hemoglobin mild decreased.  Well-appearing.  Discharge home follow-up with PCP ER or ENT.  Final Clinical Impressions(s) / ED Diagnoses   Final diagnoses:  Epistaxis    ED Discharge Orders     None       Davonna Belling, MD 06/11/18 2330

## 2018-06-11 NOTE — ED Triage Notes (Signed)
C/O of epistaxis that began this morning. Pt seen at urgent care around lunch and discharged with liquid pseudoephedrine and nasal spray. Pt states neither worked. No bleeding on arrival.

## 2018-06-11 NOTE — ED Notes (Signed)
Patient verbalizes understanding of discharge instructions. Opportunity for questioning and answers were provided. Armband removed by staff, pt discharged from ED ambulatory.   

## 2018-06-13 ENCOUNTER — Encounter (HOSPITAL_COMMUNITY): Payer: Self-pay | Admitting: Emergency Medicine

## 2018-06-13 ENCOUNTER — Other Ambulatory Visit: Payer: Self-pay

## 2018-06-13 ENCOUNTER — Emergency Department (HOSPITAL_COMMUNITY)
Admission: EM | Admit: 2018-06-13 | Discharge: 2018-06-13 | Disposition: A | Discharge status: Home / Self Care | Payer: Medicare Other | Attending: Emergency Medicine | Admitting: Emergency Medicine

## 2018-06-13 ENCOUNTER — Emergency Department (HOSPITAL_COMMUNITY): Payer: Medicare Other

## 2018-06-13 ENCOUNTER — Emergency Department (HOSPITAL_COMMUNITY)
Admission: EM | Admit: 2018-06-13 | Discharge: 2018-06-13 | Disposition: A | Discharge status: Home / Self Care | Payer: Medicare Other | Source: Home / Self Care | Attending: Emergency Medicine | Admitting: Emergency Medicine

## 2018-06-13 DIAGNOSIS — R0902 Hypoxemia: Secondary | ICD-10-CM | POA: Diagnosis not present

## 2018-06-13 DIAGNOSIS — J441 Chronic obstructive pulmonary disease with (acute) exacerbation: Secondary | ICD-10-CM | POA: Insufficient documentation

## 2018-06-13 DIAGNOSIS — Z923 Personal history of irradiation: Secondary | ICD-10-CM | POA: Diagnosis not present

## 2018-06-13 DIAGNOSIS — I959 Hypotension, unspecified: Secondary | ICD-10-CM | POA: Diagnosis not present

## 2018-06-13 DIAGNOSIS — Z79899 Other long term (current) drug therapy: Secondary | ICD-10-CM | POA: Insufficient documentation

## 2018-06-13 DIAGNOSIS — E039 Hypothyroidism, unspecified: Secondary | ICD-10-CM

## 2018-06-13 DIAGNOSIS — F17210 Nicotine dependence, cigarettes, uncomplicated: Secondary | ICD-10-CM | POA: Insufficient documentation

## 2018-06-13 DIAGNOSIS — I10 Essential (primary) hypertension: Secondary | ICD-10-CM | POA: Insufficient documentation

## 2018-06-13 DIAGNOSIS — Z853 Personal history of malignant neoplasm of breast: Secondary | ICD-10-CM | POA: Insufficient documentation

## 2018-06-13 DIAGNOSIS — Z7982 Long term (current) use of aspirin: Secondary | ICD-10-CM

## 2018-06-13 DIAGNOSIS — R040 Epistaxis: Secondary | ICD-10-CM

## 2018-06-13 DIAGNOSIS — R042 Hemoptysis: Secondary | ICD-10-CM | POA: Diagnosis not present

## 2018-06-13 DIAGNOSIS — R748 Abnormal levels of other serum enzymes: Secondary | ICD-10-CM | POA: Diagnosis not present

## 2018-06-13 DIAGNOSIS — Z8679 Personal history of other diseases of the circulatory system: Secondary | ICD-10-CM | POA: Diagnosis not present

## 2018-06-13 DIAGNOSIS — Z7902 Long term (current) use of antithrombotics/antiplatelets: Secondary | ICD-10-CM | POA: Insufficient documentation

## 2018-06-13 DIAGNOSIS — R58 Hemorrhage, not elsewhere classified: Secondary | ICD-10-CM | POA: Diagnosis not present

## 2018-06-13 LAB — CBC WITH DIFFERENTIAL/PLATELET
Abs Immature Granulocytes: 0.06 10*3/uL (ref 0.00–0.07)
Basophils Absolute: 0 10*3/uL (ref 0.0–0.1)
Basophils Relative: 0 %
Eosinophils Absolute: 0.1 10*3/uL (ref 0.0–0.5)
Eosinophils Relative: 1 %
HCT: 26.6 % — ABNORMAL LOW (ref 36.0–46.0)
Hemoglobin: 8.6 g/dL — ABNORMAL LOW (ref 12.0–15.0)
Immature Granulocytes: 1 %
Lymphocytes Relative: 10 %
Lymphs Abs: 1.3 10*3/uL (ref 0.7–4.0)
MCH: 30.9 pg (ref 26.0–34.0)
MCHC: 32.3 g/dL (ref 30.0–36.0)
MCV: 95.7 fL (ref 80.0–100.0)
Monocytes Absolute: 0.8 10*3/uL (ref 0.1–1.0)
Monocytes Relative: 6 %
Neutro Abs: 9.9 10*3/uL — ABNORMAL HIGH (ref 1.7–7.7)
Neutrophils Relative %: 82 %
Platelets: 210 10*3/uL (ref 150–400)
RBC: 2.78 MIL/uL — ABNORMAL LOW (ref 3.87–5.11)
RDW: 14.9 % (ref 11.5–15.5)
WBC: 12.1 10*3/uL — ABNORMAL HIGH (ref 4.0–10.5)
nRBC: 0 % (ref 0.0–0.2)

## 2018-06-13 LAB — BASIC METABOLIC PANEL
Anion gap: 7 (ref 5–15)
Anion gap: 12 (ref 5–15)
BUN: 38 mg/dL — ABNORMAL HIGH (ref 8–23)
BUN: 33 mg/dL — ABNORMAL HIGH (ref 8–23)
CO2: 24 mmol/L (ref 22–32)
CO2: 19 mmol/L — ABNORMAL LOW (ref 22–32)
Calcium: 8.8 mg/dL — ABNORMAL LOW (ref 8.9–10.3)
Calcium: 8.5 mg/dL — ABNORMAL LOW (ref 8.9–10.3)
Chloride: 101 mmol/L (ref 98–111)
Chloride: 101 mmol/L (ref 98–111)
Creatinine, Ser: 0.75 mg/dL (ref 0.44–1.00)
Creatinine, Ser: 1.05 mg/dL — ABNORMAL HIGH (ref 0.44–1.00)
GFR calc Af Amer: >60 mL/min (ref >60)
GFR calc Af Amer: 60 mL/min — ABNORMAL LOW (ref >60)
GFR calc non Af Amer: >60 mL/min (ref >60)
GFR calc non Af Amer: 52 mL/min — ABNORMAL LOW (ref >60)
Glucose, Bld: 115 mg/dL — ABNORMAL HIGH (ref 70–99)
Glucose, Bld: 211 mg/dL — ABNORMAL HIGH (ref 70–99)
Potassium: 3.9 mmol/L (ref 3.5–5.1)
Potassium: 4.4 mmol/L (ref 3.5–5.1)
Sodium: 132 mmol/L — ABNORMAL LOW (ref 135–145)
Sodium: 132 mmol/L — ABNORMAL LOW (ref 135–145)

## 2018-06-13 LAB — I-STAT CG4 LACTIC ACID, ED: Lactic Acid, Venous: 3.92 mmol/L (ref 0.5–1.9)

## 2018-06-13 LAB — CBC
HCT: 28 % — ABNORMAL LOW (ref 36.0–46.0)
Hemoglobin: 8.9 g/dL — ABNORMAL LOW (ref 12.0–15.0)
MCH: 30.9 pg (ref 26.0–34.0)
MCHC: 31.8 g/dL (ref 30.0–36.0)
MCV: 97.2 fL (ref 80.0–100.0)
Platelets: 203 10*3/uL (ref 150–400)
RBC: 2.88 MIL/uL — ABNORMAL LOW (ref 3.87–5.11)
RDW: 15 % (ref 11.5–15.5)
WBC: 10.7 10*3/uL — ABNORMAL HIGH (ref 4.0–10.5)
nRBC: 0 % (ref 0.0–0.2)

## 2018-06-13 MED ORDER — SODIUM CHLORIDE 0.9 % IV BOLUS
1000.0000 mL | Freq: Once | INTRAVENOUS | Status: AC
  Administered 2018-06-13: 1000 mL via INTRAVENOUS

## 2018-06-13 MED ORDER — TRANEXAMIC ACID 1000 MG/10ML IV SOLN
500.0000 mg | Freq: Once | INTRAVENOUS | Status: AC
  Administered 2018-06-13: 500 mg via TOPICAL

## 2018-06-13 MED ORDER — SALINE SPRAY 0.65 % NA SOLN
2.0000 | Freq: Once | NASAL | Status: AC
  Administered 2018-06-13: 2 via NASAL
  Filled 2018-06-13: qty 44

## 2018-06-13 MED ORDER — DEXAMETHASONE SODIUM PHOSPHATE 10 MG/ML IJ SOLN
10.0000 mg | Freq: Once | INTRAMUSCULAR | Status: AC
  Administered 2018-06-13: 10 mg via INTRAVENOUS
  Filled 2018-06-13: qty 1

## 2018-06-13 MED ORDER — IPRATROPIUM-ALBUTEROL 0.5-2.5 (3) MG/3ML IN SOLN
3.0000 mL | Freq: Once | RESPIRATORY_TRACT | Status: AC
  Administered 2018-06-13: 3 mL via RESPIRATORY_TRACT
  Filled 2018-06-13: qty 3

## 2018-06-13 MED ORDER — DEXAMETHASONE SODIUM PHOSPHATE 10 MG/ML IJ SOLN
10.0000 mg | Freq: Once | INTRAMUSCULAR | Status: AC
  Administered 2018-06-13: 10 mg via INTRAMUSCULAR
  Filled 2018-06-13: qty 1

## 2018-06-13 MED ORDER — ALBUTEROL SULFATE HFA 108 (90 BASE) MCG/ACT IN AERS
2.0000 | INHALATION_SPRAY | Freq: Four times a day (QID) | RESPIRATORY_TRACT | Status: DC
  Administered 2018-06-13: 2 via RESPIRATORY_TRACT
  Filled 2018-06-13: qty 6.7

## 2018-06-13 NOTE — ED Notes (Signed)
Bleeding is controlled at this time.

## 2018-06-13 NOTE — ED Triage Notes (Addendum)
Pt was seen yesterday and treated for epistaxis. Takes Plavix  Continues to have bleeding around the packing and down the back of her throat. Endorses coughing up blood from drainage. Pt smokes 1 ppd. Does not require home O2. Sats at 86% on RA. Pt placed on 2L [place]

## 2018-06-13 NOTE — Discharge Instructions (Addendum)
Follow-up with your nose and throat.  As arranged at your last visit.  Keep the [employer] in place.  Return for any worsening shortness of breath.

## 2018-06-13 NOTE — ED Triage Notes (Signed)
Pt left ED earlier today with packing placed in nose for a nose bleed. She takes plavix. States she is back again because she "cannot breathe out of either nostril."

## 2018-06-13 NOTE — ED Provider Notes (Addendum)
[place] EMERGENCY DEPARTMENT Provider Note   CSN: [id number] Arrival date & time: 06/13/18  1628     History   Chief Complaint Chief Complaint  Patient presents with  . Epistaxis    HPI Kendra Watts is a 72 y.o. female.  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  Patient was seen yesterday for left-sided epistaxis and she had left-sided Rhino Rocket placed.  She returned to the ER in the middle the night with bleeding from her right nare, and the EDP placed a larger Rhino Rocket to the left nare.  Since that time patient's bleeding has stopped, however she is started having difficulty in breathing through her nose.  Of note, patient was asked to be admitted to the hospital because of hypoxia that started after the [facility] was placed, however patient declined.  Patient is noted to be hypotensive at arrival, and she reports that she has not had much to drink today because of the discomfort.  She has no dizziness, lightheadedness, chest pain and has already declined admission to the hospital.  Patient denies any cough, wheezing, abd pain, n/v/diarrhea, uti like symptoms.  She states that she is able to breathe okay from her mouth, he just has difficulty breathing through her nose.  Past Medical History:  Diagnosis Date  . 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 Breast cancer ([place]) 09/22/2015   Malignant  . Breast cancer of upper-outer quadrant of left female breast ([place]) 09/08/2015  . Cataract, immature   . Heart murmur    states no known problems  . History of hyperthyroidism   . Hyperlipidemia   . Hypertension    states under control with meds., has been on med. x "long time"  . Hypothyroidism   . Nonfunctioning kidney    left  . Personal history  of radiation therapy    2017  . Radiation 10/30/15-11/28/15   left breast 42.72 Gy, boosted to 10 Gy  . Renal artery stenosis ([place])   . Tobacco abuse   . Wears partial dentures    upper and lower    Patient Active Problem List   Diagnosis Date Noted  . Breast cancer of upper-outer quadrant of left female breast ([place]) 09/08/2015  . Renal artery stenosis ([place]) 08/23/2014  . Right upper extremity numbness 07/01/2013  . Atherosclerotic renal artery stenosis, bilateral ([place]) 05/26/2013  . Peripheral arterial disease ([place]) 05/26/2013  . Essential hypertension 05/26/2013  . Hyperlipidemia 05/26/2013  . Tobacco abuse 05/26/2013  . Carotid artery stenosis 03/09/2012    Past Surgical History:  Procedure Laterality Date  . ABDOMINAL ANGIOGRAM  02/18/2012   Procedure: ABDOMINAL ANGIOGRAM;  Surgeon: Lorretta Harp, MD;  Location: [facility] CATH LAB;  Service: Cardiovascular;;  . ABDOMINAL AORTAGRAM  07/04/2014  . ABDOMINAL HYSTERECTOMY  ~ 1977   partial  . APPENDECTOMY    . ARCH AORTOGRAM    . BREAST LUMPECTOMY Left 09/22/2015   Malignant  . CAROTID ANGIOGRAM N/A 02/18/2012   Procedure: CAROTID ANGIOGRAM;  Surgeon: Lorretta Harp, MD;  Location: [facility] CATH LAB;  Service: Cardiovascular;  Laterality: N/A;  . ENDARTERECTOMY  04/02/2012   Procedure: ENDARTERECTOMY CAROTID;  Surgeon: Serafina Mitchell, MD;  Location: [facility] OR;  Service: Vascular;  Laterality: Right;  . RADIOACTIVE SEED GUIDED PARTIAL MASTECTOMY WITH AXILLARY SENTINEL LYMPH NODE BIOPSY  Left 09/22/2015   Procedure: INJECT BLUE DYE LEFT BREAST,RADIOACTIVE SEED GUIDED PARTIAL MASTECTOMY WITH AXILLARY SENTINEL LYMPH NODE BIOPSY;  Surgeon: Fanny Skates, MD;  Location: [place];  Service: General;  Laterality: Left;  . RENAL ANGIOGRAM Left 06/08/2010   renal artery stent -  5x12 Genesis on Aviator balloon stent (Dr. Adora Fridge)  . RENAL ANGIOGRAM Right 07/04/2014   Procedure: RENAL ANGIOGRAM;  Surgeon: Lorretta Harp, MD;   Location: [facility] CATH LAB;  Service: Cardiovascular;  Laterality: Right;  . RENAL ANGIOGRAM Right 08/22/2014   Procedure: RENAL ANGIOGRAM;  Surgeon: Lorretta Harp, MD;  Location: [facility] CATH LAB;  Service: Cardiovascular;  Laterality: Right;  . TONSILLECTOMY     as a child     OB History   None      Home Medications    Prior to Admission medications   Medication Sig Start Date End Date Taking? Authorizing Provider  amLODipine (NORVASC) 10 MG tablet Take 10 mg by mouth every morning.    [provider]  aspirin 81 MG tablet Take 81 mg by mouth daily.    [provider]  calcium citrate (CALCITRATE - DOSED IN MG ELEMENTAL CALCIUM) 950 MG tablet Take 1,200 mg of elemental calcium by mouth daily.    [provider]  clopidogrel (PLAVIX) 75 MG tablet Take 1 tablet (75 mg total) by mouth daily. 06/24/17   Lorretta Harp, MD  doxazosin (CARDURA) 8 MG tablet Take 8 mg by mouth every morning.      [provider]  levothyroxine (SYNTHROID, LEVOTHROID) 88 MCG tablet Take 88 mcg by mouth daily before breakfast.    [provider]  losartan (COZAAR) 100 MG tablet Take 100 mg by mouth every morning.  04/12/14   [provider]  Multiple Vitamins-Minerals (MULTIVITAMIN WITH MINERALS) tablet Take 1 tablet by mouth every morning.     [provider]  rosuvastatin (CRESTOR) 10 MG tablet Take 10 mg by mouth every morning.     [provider]  tamoxifen (NOLVADEX) 20 MG tablet TAKE 1 TABLET BY MOUTH  DAILY Patient taking differently: Take 20 mg by mouth daily.  11/03/17   Nicholas Lose, MD  umeclidinium-vilanterol (ANORO ELLIPTA) 62.5-25 MCG/INH AEPB Inhale 1 puff into the lungs daily.    [provider]    Family History Family History  Problem Relation Age of Onset  . Heart disease Mother        MI @ 13, died at 58  . Cancer Father   . Lung cancer Father   . Heart disease Maternal Grandmother   . Lung cancer Sister   .  Breast cancer Paternal Aunt     Social History Social History   Tobacco Use  . Smoking status: Current Every Day Smoker    Packs/day: 1.50    Years: 40.00    Pack years: 60.00    Types: Cigarettes  . Smokeless tobacco: Never Used  Substance Use Topics  . Alcohol use: Yes    Comment: occasionally  . Drug use: No     Allergies   Patient has no known allergies.   Review of Systems Review of Systems  Constitutional: Positive for activity change.  Respiratory: Negative for shortness of breath.   Cardiovascular: Negative for chest pain.  Neurological: Negative for dizziness.  All other systems reviewed and are negative.    Physical Exam Updated Vital Signs BP (!) 90/57   Pulse 98   Temp 98.1 F (36.7  C) (Oral)   Resp 20   SpO2 92%   Physical Exam  Constitutional: She is oriented to person, place, and time. She appears well-developed.  HENT:  Head: Normocephalic and atraumatic.  No evidence of any clot buildup in the right nare.   Eyes: EOM are normal.  Neck: Normal range of motion. Neck supple.  Cardiovascular: Normal rate and intact distal pulses.  Pulmonary/Chest: Effort normal. No respiratory distress. She has no wheezes.  Abdominal: Bowel sounds are normal.  Neurological: She is alert and oriented to person, place, and time. No cranial nerve deficit.  Skin: Skin is warm and dry.  Nursing note and vitals reviewed.    ED Treatments / Results  Labs (all labs ordered are listed, but only abnormal results are displayed) Labs Reviewed  CBC - Abnormal; Notable for the following components:      Result Value   WBC 10.7 (*)    RBC 2.88 (*)    Hemoglobin 8.9 (*)    HCT 28.0 (*)    All other components within normal limits  BASIC METABOLIC PANEL - Abnormal; Notable for the following components:   Sodium 132 (*)    CO2 19 (*)    Glucose, Bld 211 (*)    BUN 33 (*)    Creatinine, Ser 1.05 (*)    Calcium 8.5 (*)    GFR calc non Af Amer 52 (*)    GFR calc  Af Amer 60 (*)    All other components within normal limits  I-STAT CG4 LACTIC ACID, ED - Abnormal; Notable for the following components:   Lactic Acid, Venous 3.92 (*)    All other components within normal limits    EKG None  Radiology Dg Chest Portable 1 View  Result Date: 06/13/2018 CLINICAL DATA:  Patient with hypoxia EXAM: PORTABLE CHEST 1 VIEW COMPARISON:  Chest radiograph 08/22/2016 FINDINGS: Normal cardiac and mediastinal contours. Aortic atherosclerosis. Pulmonary hyperinflation. No pulmonary consolidation. IMPRESSION: Pulmonary hyperinflation.  No acute cardiopulmonary process. Electronically Signed   By: Lovey Newcomer M.D.   On: 06/13/2018 08:06    Procedures .Critical Care Performed by: Varney Biles, MD Authorized by: Varney Biles, MD   Critical care provider statement:    Critical care time (minutes):  45   Critical care start time:  06/13/2018 6:01 PM   Critical care end time:  06/13/2018 9:01 PM   Critical care was necessary to treat or prevent imminent or life-threatening deterioration of the following conditions:  Circulatory failure   Critical care was time spent personally by me on the following activities:  Discussions with consultants, evaluation of patient's response to treatment, examination of patient, ordering and performing treatments and interventions, ordering and review of laboratory studies, ordering and review of radiographic studies, pulse oximetry, re-evaluation of patient's condition, obtaining history from patient or surrogate and review of old charts   (including critical care time)   Medications Ordered in ED Medications  dexamethasone (DECADRON) injection 10 mg (has no administration in time range)  sodium chloride (OCEAN) 0.65 % nasal spray 2 spray (2 sprays Nasal Given 06/13/18 1840)  sodium chloride 0.9 % bolus 1,000 mL (0 mLs Intravenous Stopped 06/13/18 1914)  ipratropium-albuterol (DUONEB) 0.5-2.5 (3) MG/3ML nebulizer solution 3 mL  (3 mLs Nebulization Given 06/13/18 1843)     Initial Impression / Assessment and Plan / ED Course  I have reviewed the triage vital signs and the nursing notes.  Pertinent labs & imaging results that were available during my care of  the patient were reviewed by me and considered in my medical decision making (see chart for details).  Clinical Course as of Jun 13 2099  Sat Jun 13, 2018  1905 Patient's creatinine has gone up from 0.7-1.05.  I discussed with the patient the rising creatinine in the setting of nonfunctioning left kidney and hypotension.  Patient still does not want to stay in the hospital.  Her lactic acid is 3.9.  Bicarb is slightly low, some of it could be lactic acidosis but there also could be mild acute respiratory acidosis.  Patient did well upon ambulatory pulse ox and her O2 sats did not drop below 92%.   I think at this point that in addition to mechanical obstruction because of nasal packing she also might be having mild COPD exacerbation.  We will give her breathing treatment and reassess one more time prior to making any disposition.  Creatinine(!): 1.05 [AN]  1907 Improved after IV fluid.  BP(!): 97/57 [AN]  2059 Patient reassessed again.  Despite the saline spray, she continues to have some difficulty breathing.  She thinks the nebulizer treatment might have helped her breathe a little better.  Patient does not want prednisone.  She has agreed to IV Decadron in the ED.  She was also asked for admission one more time and has declined.  Strict ER return precautions have been discussed.  Patient was unable to establish an ENT follow-up for Monday or Tuesday, therefore I have asked her to come back to the ER for removal of the packing if she is not able to get an appointment with ENT.   [AN]    Clinical Course User Index [AN] Varney Biles, MD    72 year old female comes in with chief complaint of difficulty in breathing through the nose.  She has multiple medical  comorbidities and she has had a large left-sided Rhino Rocket placed last night.  At the time of EDP evaluation she was noted to be hypoxic and admission was offered but patient declined.  Patient returns to the ED without any complications of bleeding, but it seems like her DIB is associated with the Rhino Rocket in place.  I do not see any large clots that can be removed from her right nare.  There is also no clear evidence of septal deviation leading to any blockage of the nasal airway.  Oral pharyngeal exam is also normal.  We will order some Ocean Spray to see if there is any clots that can be broke up lower in the nasopharynx.  Patient is noted to be hypotensive at my assessment, however she is not symptomatic.  There is no infectious or drawn with her symptoms.  She has known history of nonfunctioning left-sided kidney, therefore we will get some labs and give her IV hydration.  Chest x-ray from this morning reviewed.  Without patient having any chest pain and exertional shortness of breath - we will not be undertaking ACS workup. Final Clinical Impressions(s) / ED Diagnoses   Final diagnoses:  COPD with acute exacerbation ([place])  Epistaxis  Elevated creatine kinase    ED Discharge Orders    None       Varney Biles, MD 06/13/18 1803    Varney Biles, MD 06/13/18 2101

## 2018-06-13 NOTE — Discharge Instructions (Signed)
We saw in the ER for difficulty in breathing.  We suspect that the difficulty in breathing is because of blockage from the nasal packing.  Unfortunately it is not safe to take the packing off at the moment.  Please use the Ocean Spray 4-6 times a day for better symptom control.  Additionally, we also think that your breathing difficulty could be because of COPD.  Make sure you are taking your breathing treatments around the clock.  If your breathing symptoms are getting worse or you start feeling like you are getting confused, please return to the ER immediately.  Finally, we noted that your blood pressure was slightly low again when you came here.  Your kidney enzyme has gone up mildly as well.  You only have one functioning kidney, therefore it is prudent that you hydrate yourself well and also follow-up with your primary care doctor in 5 to 7 days for repeat check of kidney enzyme.

## 2018-06-13 NOTE — ED Notes (Signed)
Pt's oxygen saturation fluctuated between 90% and 96% on room air while ambulating; steady gait

## 2018-06-13 NOTE — ED Notes (Addendum)
Pt's O2 sats observed to be in the upper 70's on RA; pt placed on 2 L via [place]; MD notified

## 2018-06-13 NOTE — ED Provider Notes (Signed)
[place] EMERGENCY DEPARTMENT Provider Note   CSN: [id number] Arrival date & time: 06/13/18  0355     History   Chief Complaint Chief Complaint  Patient presents with  . Epistaxis    HPI KENZLEIGH SEDAM is a 72 y.o. female.  HPI  This is a 72 year old female who presents with nosebleed.  Patient reports that she had packing placed 2 days ago in the left naris.  She has noted bleeding mostly down the back of her throat.  Of note, she was noted to be mildly hypoxic upon arrival with O2 sats in the mid to high 80s.  She denies any significant shortness of breath.  She is a smoker.  She does not use home oxygen.  Patient reports that she is on Plavix daily.    Past Medical History:  Diagnosis Date  . 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 Breast cancer ([place]) 09/22/2015   Malignant  . Breast cancer of upper-outer quadrant of left female breast ([place]) 09/08/2015  . Cataract, immature   . Heart murmur    states no known problems  . History of hyperthyroidism   . Hyperlipidemia   . Hypertension    states under control with meds., has been on med. x "long time"  . Hypothyroidism   . Nonfunctioning kidney    left  . Personal history of radiation therapy    2017  . Radiation 10/30/15-11/28/15   left breast 42.72 Gy, boosted to 10 Gy  . Renal artery stenosis ([place])   . Tobacco abuse   . Wears partial dentures    upper and lower    Patient Active Problem List   Diagnosis Date Noted  . Breast cancer of upper-outer quadrant of left female breast ([place]) 09/08/2015  . Renal artery stenosis ([place]) 08/23/2014  . Right upper extremity numbness 07/01/2013  . Atherosclerotic renal artery stenosis, bilateral ([place]) 05/26/2013  . Peripheral arterial disease ([place]) 05/26/2013  . Essential hypertension 05/26/2013  . Hyperlipidemia 05/26/2013  . Tobacco abuse 05/26/2013  . Carotid artery stenosis 03/09/2012    Past Surgical History:  Procedure Laterality Date  .  ABDOMINAL ANGIOGRAM  02/18/2012   Procedure: ABDOMINAL ANGIOGRAM;  Surgeon: Lorretta Harp, MD;  Location: [facility] CATH LAB;  Service: Cardiovascular;;  . ABDOMINAL AORTAGRAM  07/04/2014  . ABDOMINAL HYSTERECTOMY  ~ 1977   partial  . APPENDECTOMY    . ARCH AORTOGRAM    . BREAST LUMPECTOMY Left 09/22/2015   Malignant  . CAROTID ANGIOGRAM N/A 02/18/2012   Procedure: CAROTID ANGIOGRAM;  Surgeon: Lorretta Harp, MD;  Location: [facility] CATH LAB;  Service: Cardiovascular;  Laterality: N/A;  . ENDARTERECTOMY  04/02/2012   Procedure: ENDARTERECTOMY CAROTID;  Surgeon: Serafina Mitchell, MD;  Location: [facility] OR;  Service: Vascular;  Laterality: Right;  . RADIOACTIVE SEED GUIDED PARTIAL MASTECTOMY WITH AXILLARY SENTINEL LYMPH NODE BIOPSY Left 09/22/2015   Procedure: INJECT BLUE DYE LEFT BREAST,RADIOACTIVE SEED GUIDED PARTIAL MASTECTOMY WITH AXILLARY SENTINEL LYMPH NODE BIOPSY;  Surgeon: Fanny Skates, MD;  Location: [place];  Service: General;  Laterality: Left;  . RENAL ANGIOGRAM Left 06/08/2010   renal artery stent -  5x12 Genesis on Aviator balloon stent (Dr. Adora Fridge)  . RENAL ANGIOGRAM Right 07/04/2014   Procedure: RENAL ANGIOGRAM;  Surgeon: Lorretta Harp, MD;  Location: [facility] CATH LAB;  Service: Cardiovascular;  Laterality: Right;  . RENAL ANGIOGRAM Right 08/22/2014   Procedure: RENAL ANGIOGRAM;  Surgeon: [name]  Gwenlyn Found, MD;  Location: [facility] CATH LAB;  Service: Cardiovascular;  Laterality: Right;  . TONSILLECTOMY     as a child     OB History   None      Home Medications    Prior to Admission medications   Medication Sig Start Date End Date Taking? Authorizing Provider  amLODipine (NORVASC) 10 MG tablet Take 10 mg by mouth every morning.   Yes [provider]  aspirin 81 MG tablet Take 81 mg by mouth daily.   Yes [provider]  calcium citrate (CALCITRATE - DOSED IN MG ELEMENTAL CALCIUM) 950 MG tablet Take 1,200 mg of elemental calcium by mouth daily.   Yes  [provider]  clopidogrel (PLAVIX) 75 MG tablet Take 1 tablet (75 mg total) by mouth daily. 06/24/17  Yes Lorretta Harp, MD  doxazosin (CARDURA) 8 MG tablet Take 8 mg by mouth every morning.     Yes [provider]  levothyroxine (SYNTHROID, LEVOTHROID) 88 MCG tablet Take 88 mcg by mouth daily before breakfast.   Yes [provider]  losartan (COZAAR) 100 MG tablet Take 100 mg by mouth every morning.  04/12/14  Yes [provider]  Multiple Vitamins-Minerals (MULTIVITAMIN WITH MINERALS) tablet Take 1 tablet by mouth every morning.    Yes [provider]  rosuvastatin (CRESTOR) 10 MG tablet Take 10 mg by mouth every morning.    Yes [provider]  tamoxifen (NOLVADEX) 20 MG tablet TAKE 1 TABLET BY MOUTH  DAILY Patient taking differently: Take 20 mg by mouth daily.  11/03/17  Yes Nicholas Lose, MD  umeclidinium-vilanterol (ANORO ELLIPTA) 62.5-25 MCG/INH AEPB Inhale 1 puff into the lungs daily.   Yes [provider]    Family History Family History  Problem Relation Age of Onset  . Heart disease Mother        MI @ 41, died at 72  . Cancer Father   . Lung cancer Father   . Heart disease Maternal Grandmother   . Lung cancer Sister   . Breast cancer Paternal Aunt     Social History Social History   Tobacco Use  . Smoking status: Current Every Day Smoker    Packs/day: 1.50    Years: 40.00    Pack years: 60.00    Types: Cigarettes  . Smokeless tobacco: Never Used  Substance Use Topics  . Alcohol use: Yes    Comment: occasionally  . Drug use: No     Allergies   Patient has no known allergies.   Review of Systems Review of Systems  Constitutional: Negative for fever.  HENT: Positive for nosebleeds.   Respiratory: Negative for shortness of breath.   Cardiovascular: Negative for chest pain and leg swelling.  Gastrointestinal: Negative for abdominal pain.  All other systems reviewed and are  negative.    Physical Exam Updated Vital Signs BP (!) 87/58   Pulse (!) 101   Resp 18   SpO2 (!) 83%   Physical Exam  Constitutional: She is oriented to person, place, and time.  Tonically ill-appearing but nontoxic, no acute distress  HENT:  Head: Normocephalic and atraumatic.  Rhino Rocket left naris, patient spitting out clot and bright red blood, bright red blood noted from right naris  Eyes: Pupils are equal, round, and reactive to light.  Cardiovascular: Normal rate, regular rhythm and normal heart sounds.  Pulmonary/Chest: Effort normal. No respiratory distress. She has wheezes. She has no rales.  Abdominal: Soft. There is no  tenderness.  Neurological: She is alert and oriented to person, place, and time.  Skin: Skin is warm and dry.  Psychiatric: She has a normal mood and affect.  Nursing note and vitals reviewed.    ED Treatments / Results  Labs (all labs ordered are listed, but only abnormal results are displayed) Labs Reviewed  CBC WITH DIFFERENTIAL/PLATELET  BASIC METABOLIC PANEL    EKG None  Radiology No results found.  Procedures .Epistaxis Management Date/Time: 06/13/2018 7:36 AM Performed by: Merryl Hacker, MD Authorized by: Merryl Hacker, MD   Consent:    Consent obtained:  Verbal   Consent given by:  Patient   Risks discussed:  Bleeding and nasal injury   Alternatives discussed:  No treatment Anesthesia (see MAR for exact dosages):    Anesthesia method:  Topical application   Topical anesthetic:  Lidocaine gel Procedure details:    Treatment site:  L posterior   Repair method: rapid rhino 900.   Treatment complexity:  Extensive   Treatment episode: recurring   Post-procedure details:    Assessment:  Bleeding decreased   Patient tolerance of procedure:  Tolerated well, no immediate complications   (including critical care time)  Medications Ordered in ED Medications  ipratropium-albuterol (DUONEB) 0.5-2.5 (3) MG/3ML  nebulizer solution 3 mL (has no administration in time range)  dexamethasone (DECADRON) injection 10 mg (has no administration in time range)  tranexamic acid (CYKLOKAPRON) injection 500 mg (500 mg Topical Given 06/13/18 0526)     Initial Impression / Assessment and Plan / ED Course  I have reviewed the triage vital signs and the nursing notes.  Pertinent labs & imaging results that were available during my care of the patient were reviewed by me and considered in my medical decision making (see chart for details).     Patient presents with epistaxis.  She has active bleeding mostly through the oropharynx but suspect this is likely from the known bleed of the left naris.  She was mildly hypoxic; however, she has an extensive smoking history and suspect underlying COPD.  Suspect that she is not able to take big deep breaths given obstruction of the left naris.  She has some wheezing on exam.  She denies any significant shortness of breath.  Will monitor.  Patient was initially given a TXA neb in an attempt to avoid having to repack.  This did not work.  She subsequently was repacked as above.  She had significant improvement of bleeding.  However, with the larger [employer], she dropped her O2 sats into the mid 70s requiring significant oxygen supplementation.  Lab work and chest x-ray were placed.  Patient was given a DuoNeb and Decadron.  She may need admission for persistent hypoxia.  Final Clinical Impressions(s) / ED Diagnoses   Final diagnoses:  Epistaxis  Hypoxia    ED Discharge Orders    None       Merryl Hacker, MD 06/13/18 [id number]

## 2018-06-13 NOTE — ED Provider Notes (Signed)
Patient absolutely refuses admission.  However oxygen saturations are about 90%.  Occasionally 89.  Patient probably has undiagnosed COPD.  Part of the problem is the fact she got the [employer] in her left nares.  Patient Follow-up with your nose and throat.  Follow-up with primary care provider.  Patient given Decadron here.  Patient will give be given an albuterol inhaler to go.   Fredia Sorrow, MD 06/13/18 1003

## 2018-06-13 NOTE — ED Notes (Signed)
LAC IV removed by Lysbeth Galas, EMT

## 2018-06-15 ENCOUNTER — Emergency Department (HOSPITAL_COMMUNITY)
Admission: EM | Admit: 2018-06-15 | Discharge: 2018-06-15 | Disposition: A | Discharge status: Home / Self Care | Payer: Medicare Other | Attending: Emergency Medicine | Admitting: Emergency Medicine

## 2018-06-15 ENCOUNTER — Encounter (HOSPITAL_COMMUNITY): Payer: Self-pay | Admitting: Emergency Medicine

## 2018-06-15 DIAGNOSIS — Z79899 Other long term (current) drug therapy: Secondary | ICD-10-CM | POA: Diagnosis not present

## 2018-06-15 DIAGNOSIS — R040 Epistaxis: Secondary | ICD-10-CM

## 2018-06-15 DIAGNOSIS — J449 Chronic obstructive pulmonary disease, unspecified: Secondary | ICD-10-CM

## 2018-06-15 DIAGNOSIS — F17210 Nicotine dependence, cigarettes, uncomplicated: Secondary | ICD-10-CM | POA: Insufficient documentation

## 2018-06-15 DIAGNOSIS — I10 Essential (primary) hypertension: Secondary | ICD-10-CM | POA: Diagnosis not present

## 2018-06-15 DIAGNOSIS — E039 Hypothyroidism, unspecified: Secondary | ICD-10-CM | POA: Insufficient documentation

## 2018-06-15 DIAGNOSIS — Z7982 Long term (current) use of aspirin: Secondary | ICD-10-CM | POA: Insufficient documentation

## 2018-06-15 NOTE — ED Triage Notes (Signed)
Pt states she had a balloon placed on Thursday in her left nostril for bleeding. Pt is here for it to be removed. Denies any further bleeding. Pt complains of congestion.

## 2018-06-15 NOTE — ED Provider Notes (Signed)
[facility] EMERGENCY DEPARTMENT Provider Note   CSN: [id number] Arrival date & time: 06/15/18  1009     History   Chief Complaint No chief complaint on file.   HPI Kendra Watts is a 72 y.o. female.  HPI    72 year old female presents today for recheck of nasal packing.  Patient was originally seen on 06/11/2018 with epistaxis.  She had a nasal balloon placed.  She returned 2 days later with bleeding out of the left nare.  The packing was removed, rapid Rhino was placed in the left nare which caused hemostasis.  Patient was noted to be hypoxic and slightly hypotensive at that visit.  Patient did not want hospital admission for ongoing management.  She notes a history of COPD in the past, she reports in 2013 she was sent home with home oxygen which he does not use.  Patient denies any shortness of breath or chest pain, denies any productive cough.  She denies any changes from her baseline respiratory status.  She reports she called ENT and they reported they would not take the balloon out for approximately 7 days so she came to the emergency room for evaluation.  Patient denies any further bleeding or signs of infection.  She does note that she is on Plavix, she did not take it this morning.   Past Medical History:  Diagnosis Date  . 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 Breast cancer ([place]) 09/22/2015   Malignant  . Breast cancer of upper-outer quadrant of left female breast ([place]) 09/08/2015  . Cataract, immature   . Heart murmur    states no known problems  . History of hyperthyroidism   . Hyperlipidemia   . Hypertension    states under control with meds., has been on med. x "long time"  . Hypothyroidism   . Nonfunctioning kidney    left  . Personal history of radiation therapy    2017  . Radiation 10/30/15-11/28/15   left breast 42.72 Gy, boosted to 10 Gy  . Renal artery stenosis ([place])   . Tobacco abuse   . Wears partial dentures    upper and  lower    Patient Active Problem List   Diagnosis Date Noted  . Breast cancer of upper-outer quadrant of left female breast ([place]) 09/08/2015  . Renal artery stenosis ([place]) 08/23/2014  . Right upper extremity numbness 07/01/2013  . Atherosclerotic renal artery stenosis, bilateral ([place]) 05/26/2013  . Peripheral arterial disease ([place]) 05/26/2013  . Essential hypertension 05/26/2013  . Hyperlipidemia 05/26/2013  . Tobacco abuse 05/26/2013  . Carotid artery stenosis 03/09/2012    Past Surgical History:  Procedure Laterality Date  . ABDOMINAL ANGIOGRAM  02/18/2012   Procedure: ABDOMINAL ANGIOGRAM;  Surgeon: Lorretta Harp, MD;  Location: [facility] CATH LAB;  Service: Cardiovascular;;  . ABDOMINAL AORTAGRAM  07/04/2014  . ABDOMINAL HYSTERECTOMY  ~ 1977   partial  . APPENDECTOMY    . ARCH AORTOGRAM    . BREAST LUMPECTOMY Left 09/22/2015   Malignant  . CAROTID ANGIOGRAM N/A 02/18/2012   Procedure: CAROTID ANGIOGRAM;  Surgeon: Lorretta Harp, MD;  Location: [facility] CATH LAB;  Service: Cardiovascular;  Laterality: N/A;  . ENDARTERECTOMY  04/02/2012   Procedure: ENDARTERECTOMY CAROTID;  Surgeon: Serafina Mitchell, MD;  Location: [facility] OR;  Service: Vascular;  Laterality: Right;  . RADIOACTIVE SEED GUIDED PARTIAL MASTECTOMY WITH AXILLARY SENTINEL LYMPH NODE BIOPSY Left 09/22/2015   Procedure: INJECT BLUE DYE LEFT BREAST,RADIOACTIVE  SEED GUIDED PARTIAL MASTECTOMY WITH AXILLARY SENTINEL LYMPH NODE BIOPSY;  Surgeon: Fanny Skates, MD;  Location: [facility];  Service: General;  Laterality: Left;  . RENAL ANGIOGRAM Left 06/08/2010   renal artery stent -  5x12 Genesis on Aviator balloon stent (Dr. Adora Fridge)  . RENAL ANGIOGRAM Right 07/04/2014   Procedure: RENAL ANGIOGRAM;  Surgeon: Lorretta Harp, MD;  Location: [facility] CATH LAB;  Service: Cardiovascular;  Laterality: Right;  . RENAL ANGIOGRAM Right 08/22/2014   Procedure: RENAL ANGIOGRAM;  Surgeon: Lorretta Harp, MD;  Location: [facility] CATH LAB;  Service:  Cardiovascular;  Laterality: Right;  . TONSILLECTOMY     as a child     OB History   None      Home Medications    Prior to Admission medications   Medication Sig Start Date End Date Taking? Authorizing Provider  amLODipine (NORVASC) 10 MG tablet Take 10 mg by mouth every morning.    [provider]  aspirin 81 MG tablet Take 81 mg by mouth daily.    [provider]  calcium citrate (CALCITRATE - DOSED IN MG ELEMENTAL CALCIUM) 950 MG tablet Take 1,200 mg of elemental calcium by mouth daily.    [provider]  clopidogrel (PLAVIX) 75 MG tablet Take 1 tablet (75 mg total) by mouth daily. 06/24/17   Lorretta Harp, MD  doxazosin (CARDURA) 8 MG tablet Take 8 mg by mouth every morning.      [provider]  levothyroxine (SYNTHROID, LEVOTHROID) 88 MCG tablet Take 88 mcg by mouth daily before breakfast.    [provider]  losartan (COZAAR) 100 MG tablet Take 100 mg by mouth every morning.  04/12/14   [provider]  Multiple Vitamins-Minerals (MULTIVITAMIN WITH MINERALS) tablet Take 1 tablet by mouth every morning.     [provider]  rosuvastatin (CRESTOR) 10 MG tablet Take 10 mg by mouth every morning.     [provider]  tamoxifen (NOLVADEX) 20 MG tablet TAKE 1 TABLET BY MOUTH  DAILY Patient taking differently: Take 20 mg by mouth daily.  11/03/17   Nicholas Lose, MD  umeclidinium-vilanterol (ANORO ELLIPTA) 62.5-25 MCG/INH AEPB Inhale 1 puff into the lungs daily.    [provider]    Family History Family History  Problem Relation Age of Onset  . Heart disease Mother        MI @ 71, died at 69  . Cancer Father   . Lung cancer Father   . Heart disease Maternal Grandmother   . Lung cancer Sister   . Breast cancer Paternal Aunt     Social History Social History   Tobacco Use  . Smoking status: Current Every Day Smoker    Packs/day: 1.50    Years: 40.00    Pack years: 60.00    Types:  Cigarettes  . Smokeless tobacco: Never Used  Substance Use Topics  . Alcohol use: Yes    Comment: occasionally  . Drug use: No     Allergies   Patient has no known allergies.   Review of Systems Review of Systems  All other systems reviewed and are negative.  Physical Exam Updated Vital Signs BP 102/60 (BP Location: Right Arm)   Pulse (!) 106   Temp 97.8 F (36.6 C) (Oral)   Resp 15   Ht 5' (1.524 m)   Wt 39.5 kg   SpO2 (!) 89%   BMI 16.99 kg/m    Physical Exam  Constitutional: She is oriented to person, place, and time. She appears well-developed and well-nourished.  HENT:  Head: Normocephalic and atraumatic.  Balloon packing noted in the left nare dried blood noted, no active bleeding no surrounding redness  Eyes: Pupils are equal, round, and reactive to light. Conjunctivae are normal. Right eye exhibits no discharge. Left eye exhibits no discharge. No scleral icterus.  Neck: Normal range of motion. No JVD present. No tracheal deviation present.  Pulmonary/Chest: Effort normal and breath sounds normal. No stridor. No respiratory distress. She has no wheezes. She has no rales. She exhibits no tenderness.  Neurological: She is alert and oriented to person, place, and time. Coordination normal.  Psychiatric: She has a normal mood and affect. Her behavior is normal. Judgment and thought content normal.  Nursing note and vitals reviewed.   ED Treatments / Results  Labs (all labs ordered are listed, but only abnormal results are displayed) Labs Reviewed - No data to display  EKG None  Radiology No results found.  Procedures Procedures (including critical care time)  Medications Ordered in ED Medications - No data to display   Initial Impression / Assessment and Plan / ED Course  I have reviewed the triage vital signs and the nursing notes.  Pertinent labs & imaging results that were available during my care of the patient were reviewed by me and considered  in my medical decision making (see chart for details).     Labs:   Imaging:  Consults:  Therapeutics:  Discharge Meds:   Assessment/Plan: 65 YOF presents today for recheck.  Patient had a balloon placed 2 days ago.  She contacted ENT as recommended who would like to have the packing removed in 1 week.  Patient did have several attempts at packing I do not feel that removing the packing at this time would be beneficial as ENT recommends 1 week of treatment.  Patient will contact ENT and schedule outpatient follow-up, she will return immediately she develops any new or worsening signs or symptoms.  If she is unable to have the packing removed due to the holidays she may return to the emergency room.  Patient's oxygen was in the mid to upper 80s throughout her visit here today.  She had no respiratory complaints has no dyspnea with exertion.  Chart review shows patient has been hypoxic in the past, in 2013 she was sent home on oxygen which she does not use.  She had a plain film x-ray previously showing no acute abnormalities, has had no changes I do feel she is stable for outpatient management of this.  She will follow-up with her primary care for repeat assessment this week.  Return immediately with any new or worsening signs or symptoms.  Patient verbalized understanding and agreement to today's plan had no further questions or concerns the time discharge.  Case was discussed with Dr. Vanita Panda who agreed to assessment and plan today.   Final Clinical Impressions(s) / ED Diagnoses   Final diagnoses:  Epistaxis  Chronic obstructive pulmonary disease, unspecified COPD type [facility])    ED Discharge Orders    None       Francee Gentile 06/15/18 1143    Carmin Muskrat, MD 06/15/18 [phone]

## 2018-06-15 NOTE — ED Notes (Signed)
Pt aware that she needs to give neighbors keys to her apt and to to make office  Aware of her situation , pt understands along with her nighbor that is with her  Of ALL the d/c instructions,

## 2018-06-15 NOTE — Discharge Instructions (Addendum)
Please read the attached information.  Please call ENT office as previously instructed for follow-up plan.  If you experience any new or worsening signs or symptoms please return to the emergency room.  Please call your primary care provider and schedule a follow-up evaluation for repeat laboratory analysis and ongoing decreased oxygen saturation.

## 2018-06-17 DIAGNOSIS — Z7901 Long term (current) use of anticoagulants: Secondary | ICD-10-CM | POA: Diagnosis not present

## 2018-06-17 DIAGNOSIS — R040 Epistaxis: Secondary | ICD-10-CM | POA: Diagnosis not present

## 2018-06-25 DIAGNOSIS — E871 Hypo-osmolality and hyponatremia: Secondary | ICD-10-CM | POA: Diagnosis not present

## 2018-06-25 DIAGNOSIS — D649 Anemia, unspecified: Secondary | ICD-10-CM | POA: Diagnosis not present

## 2018-06-25 DIAGNOSIS — R5383 Other fatigue: Secondary | ICD-10-CM | POA: Diagnosis not present

## 2018-06-25 DIAGNOSIS — I129 Hypertensive chronic kidney disease with stage 1 through stage 4 chronic kidney disease, or unspecified chronic kidney disease: Secondary | ICD-10-CM | POA: Diagnosis not present

## 2018-06-25 DIAGNOSIS — R0602 Shortness of breath: Secondary | ICD-10-CM | POA: Diagnosis not present

## 2018-07-01 ENCOUNTER — Other Ambulatory Visit: Payer: Self-pay | Admitting: Cardiovascular Disease

## 2018-07-02 DIAGNOSIS — I129 Hypertensive chronic kidney disease with stage 1 through stage 4 chronic kidney disease, or unspecified chronic kidney disease: Secondary | ICD-10-CM | POA: Diagnosis not present

## 2018-07-02 DIAGNOSIS — R7303 Prediabetes: Secondary | ICD-10-CM | POA: Diagnosis not present

## 2018-07-02 DIAGNOSIS — D649 Anemia, unspecified: Secondary | ICD-10-CM | POA: Diagnosis not present

## 2018-07-02 DIAGNOSIS — R0602 Shortness of breath: Secondary | ICD-10-CM | POA: Diagnosis not present

## 2018-07-02 DIAGNOSIS — E782 Mixed hyperlipidemia: Secondary | ICD-10-CM | POA: Diagnosis not present

## 2018-07-02 DIAGNOSIS — I739 Peripheral vascular disease, unspecified: Secondary | ICD-10-CM | POA: Diagnosis not present

## 2018-07-02 DIAGNOSIS — E871 Hypo-osmolality and hyponatremia: Secondary | ICD-10-CM | POA: Diagnosis not present

## 2018-07-02 DIAGNOSIS — D500 Iron deficiency anemia secondary to blood loss (chronic): Secondary | ICD-10-CM | POA: Diagnosis not present

## 2018-07-02 DIAGNOSIS — R5383 Other fatigue: Secondary | ICD-10-CM | POA: Diagnosis not present

## 2018-07-03 NOTE — Telephone Encounter (Signed)
Rx request sent to pharmacy.  

## 2018-07-05 DIAGNOSIS — R000 Tachycardia, unspecified: Secondary | ICD-10-CM | POA: Diagnosis not present

## 2018-07-05 DIAGNOSIS — R0902 Hypoxemia: Secondary | ICD-10-CM | POA: Diagnosis not present

## 2018-07-05 DIAGNOSIS — I10 Essential (primary) hypertension: Secondary | ICD-10-CM | POA: Diagnosis not present

## 2018-07-05 DIAGNOSIS — R0602 Shortness of breath: Secondary | ICD-10-CM | POA: Diagnosis not present

## 2018-07-05 DIAGNOSIS — I959 Hypotension, unspecified: Secondary | ICD-10-CM | POA: Diagnosis not present

## 2018-07-06 ENCOUNTER — Emergency Department (HOSPITAL_COMMUNITY): Payer: Medicare Other

## 2018-07-06 ENCOUNTER — Encounter (HOSPITAL_COMMUNITY): Payer: Self-pay | Admitting: Emergency Medicine

## 2018-07-06 ENCOUNTER — Inpatient Hospital Stay (HOSPITAL_COMMUNITY): Payer: Medicare Other

## 2018-07-06 ENCOUNTER — Inpatient Hospital Stay (HOSPITAL_COMMUNITY)
Admission: EM | Admit: 2018-07-06 | Discharge: 2018-07-13 | DRG: 186 | Disposition: A | Discharge status: Home / Self Care | Payer: Medicare Other | Attending: Internal Medicine | Admitting: Internal Medicine

## 2018-07-06 ENCOUNTER — Other Ambulatory Visit: Payer: Self-pay

## 2018-07-06 DIAGNOSIS — J810 Acute pulmonary edema: Secondary | ICD-10-CM | POA: Diagnosis not present

## 2018-07-06 DIAGNOSIS — F17210 Nicotine dependence, cigarettes, uncomplicated: Secondary | ICD-10-CM | POA: Diagnosis present

## 2018-07-06 DIAGNOSIS — I110 Hypertensive heart disease with heart failure: Secondary | ICD-10-CM | POA: Diagnosis present

## 2018-07-06 DIAGNOSIS — I959 Hypotension, unspecified: Secondary | ICD-10-CM | POA: Diagnosis not present

## 2018-07-06 DIAGNOSIS — C50412 Malignant neoplasm of upper-outer quadrant of left female breast: Secondary | ICD-10-CM | POA: Diagnosis present

## 2018-07-06 DIAGNOSIS — I10 Essential (primary) hypertension: Secondary | ICD-10-CM | POA: Diagnosis not present

## 2018-07-06 DIAGNOSIS — R911 Solitary pulmonary nodule: Secondary | ICD-10-CM | POA: Diagnosis present

## 2018-07-06 DIAGNOSIS — I509 Heart failure, unspecified: Secondary | ICD-10-CM | POA: Diagnosis not present

## 2018-07-06 DIAGNOSIS — Z803 Family history of malignant neoplasm of breast: Secondary | ICD-10-CM

## 2018-07-06 DIAGNOSIS — J209 Acute bronchitis, unspecified: Secondary | ICD-10-CM | POA: Diagnosis present

## 2018-07-06 DIAGNOSIS — D649 Anemia, unspecified: Secondary | ICD-10-CM

## 2018-07-06 DIAGNOSIS — R846 Abnormal cytological findings in specimens from respiratory organs and thorax: Secondary | ICD-10-CM | POA: Diagnosis not present

## 2018-07-06 DIAGNOSIS — E876 Hypokalemia: Secondary | ICD-10-CM | POA: Diagnosis present

## 2018-07-06 DIAGNOSIS — Z9012 Acquired absence of left breast and nipple: Secondary | ICD-10-CM

## 2018-07-06 DIAGNOSIS — J90 Pleural effusion, not elsewhere classified: Secondary | ICD-10-CM | POA: Diagnosis present

## 2018-07-06 DIAGNOSIS — E039 Hypothyroidism, unspecified: Secondary | ICD-10-CM | POA: Diagnosis present

## 2018-07-06 DIAGNOSIS — R059 Cough, unspecified: Secondary | ICD-10-CM

## 2018-07-06 DIAGNOSIS — R918 Other nonspecific abnormal finding of lung field: Secondary | ICD-10-CM | POA: Diagnosis not present

## 2018-07-06 DIAGNOSIS — J440 Chronic obstructive pulmonary disease with acute lower respiratory infection: Secondary | ICD-10-CM | POA: Diagnosis present

## 2018-07-06 DIAGNOSIS — E871 Hypo-osmolality and hyponatremia: Secondary | ICD-10-CM

## 2018-07-06 DIAGNOSIS — J9611 Chronic respiratory failure with hypoxia: Secondary | ICD-10-CM | POA: Diagnosis present

## 2018-07-06 DIAGNOSIS — Z9889 Other specified postprocedural states: Secondary | ICD-10-CM

## 2018-07-06 DIAGNOSIS — I361 Nonrheumatic tricuspid (valve) insufficiency: Secondary | ICD-10-CM

## 2018-07-06 DIAGNOSIS — R0600 Dyspnea, unspecified: Secondary | ICD-10-CM

## 2018-07-06 DIAGNOSIS — J441 Chronic obstructive pulmonary disease with (acute) exacerbation: Secondary | ICD-10-CM | POA: Diagnosis not present

## 2018-07-06 DIAGNOSIS — N179 Acute kidney failure, unspecified: Secondary | ICD-10-CM | POA: Diagnosis present

## 2018-07-06 DIAGNOSIS — Z79890 Hormone replacement therapy (postmenopausal): Secondary | ICD-10-CM

## 2018-07-06 DIAGNOSIS — E785 Hyperlipidemia, unspecified: Secondary | ICD-10-CM | POA: Diagnosis present

## 2018-07-06 DIAGNOSIS — I5031 Acute diastolic (congestive) heart failure: Secondary | ICD-10-CM | POA: Diagnosis present

## 2018-07-06 DIAGNOSIS — Z7902 Long term (current) use of antithrombotics/antiplatelets: Secondary | ICD-10-CM

## 2018-07-06 DIAGNOSIS — R7989 Other specified abnormal findings of blood chemistry: Secondary | ICD-10-CM | POA: Diagnosis not present

## 2018-07-06 DIAGNOSIS — R05 Cough: Secondary | ICD-10-CM | POA: Diagnosis not present

## 2018-07-06 DIAGNOSIS — E43 Unspecified severe protein-calorie malnutrition: Secondary | ICD-10-CM | POA: Diagnosis present

## 2018-07-06 DIAGNOSIS — J811 Chronic pulmonary edema: Secondary | ICD-10-CM | POA: Diagnosis not present

## 2018-07-06 DIAGNOSIS — J9601 Acute respiratory failure with hypoxia: Secondary | ICD-10-CM | POA: Diagnosis not present

## 2018-07-06 DIAGNOSIS — Z79810 Long term (current) use of selective estrogen receptor modulators (SERMs): Secondary | ICD-10-CM

## 2018-07-06 DIAGNOSIS — Z23 Encounter for immunization: Secondary | ICD-10-CM | POA: Diagnosis not present

## 2018-07-06 DIAGNOSIS — R000 Tachycardia, unspecified: Secondary | ICD-10-CM | POA: Diagnosis not present

## 2018-07-06 DIAGNOSIS — Z923 Personal history of irradiation: Secondary | ICD-10-CM

## 2018-07-06 DIAGNOSIS — D509 Iron deficiency anemia, unspecified: Secondary | ICD-10-CM | POA: Diagnosis present

## 2018-07-06 DIAGNOSIS — R0902 Hypoxemia: Secondary | ICD-10-CM | POA: Diagnosis not present

## 2018-07-06 DIAGNOSIS — Z853 Personal history of malignant neoplasm of breast: Secondary | ICD-10-CM | POA: Diagnosis not present

## 2018-07-06 DIAGNOSIS — I739 Peripheral vascular disease, unspecified: Secondary | ICD-10-CM | POA: Diagnosis present

## 2018-07-06 DIAGNOSIS — Z8249 Family history of ischemic heart disease and other diseases of the circulatory system: Secondary | ICD-10-CM | POA: Diagnosis not present

## 2018-07-06 DIAGNOSIS — Z681 Body mass index (BMI) 19 or less, adult: Secondary | ICD-10-CM

## 2018-07-06 DIAGNOSIS — Z7982 Long term (current) use of aspirin: Secondary | ICD-10-CM

## 2018-07-06 DIAGNOSIS — H269 Unspecified cataract: Secondary | ICD-10-CM | POA: Diagnosis present

## 2018-07-06 DIAGNOSIS — R0602 Shortness of breath: Secondary | ICD-10-CM | POA: Diagnosis not present

## 2018-07-06 DIAGNOSIS — Z79899 Other long term (current) drug therapy: Secondary | ICD-10-CM

## 2018-07-06 LAB — ECHOCARDIOGRAM COMPLETE
Height: 60 in
Weight: 1553.6 oz

## 2018-07-06 LAB — BASIC METABOLIC PANEL
Anion gap: 12 (ref 5–15)
Anion gap: 11 (ref 5–15)
Anion gap: 11 (ref 5–15)
Anion gap: 9 (ref 5–15)
BUN: 7 mg/dL — ABNORMAL LOW (ref 8–23)
BUN: 5 mg/dL — ABNORMAL LOW (ref 8–23)
BUN: 7 mg/dL — ABNORMAL LOW (ref 8–23)
BUN: 9 mg/dL (ref 8–23)
CO2: 22 mmol/L (ref 22–32)
CO2: 27 mmol/L (ref 22–32)
CO2: 27 mmol/L (ref 22–32)
CO2: 27 mmol/L (ref 22–32)
Calcium: 8.3 mg/dL — ABNORMAL LOW (ref 8.9–10.3)
Calcium: 8.5 mg/dL — ABNORMAL LOW (ref 8.9–10.3)
Calcium: 8.5 mg/dL — ABNORMAL LOW (ref 8.9–10.3)
Calcium: 8.4 mg/dL — ABNORMAL LOW (ref 8.9–10.3)
Chloride: 87 mmol/L — ABNORMAL LOW (ref 98–111)
Chloride: 93 mmol/L — ABNORMAL LOW (ref 98–111)
Chloride: 94 mmol/L — ABNORMAL LOW (ref 98–111)
Chloride: 98 mmol/L (ref 98–111)
Creatinine, Ser: 0.56 mg/dL (ref 0.44–1.00)
Creatinine, Ser: 0.83 mg/dL (ref 0.44–1.00)
Creatinine, Ser: 0.67 mg/dL (ref 0.44–1.00)
Creatinine, Ser: 1.07 mg/dL — ABNORMAL HIGH (ref 0.44–1.00)
GFR calc Af Amer: >60 mL/min (ref >60)
GFR calc Af Amer: >60 mL/min (ref >60)
GFR calc Af Amer: >60 mL/min (ref >60)
GFR calc Af Amer: >60 mL/min (ref >60)
GFR calc non Af Amer: >60 mL/min (ref >60)
GFR calc non Af Amer: >60 mL/min (ref >60)
GFR calc non Af Amer: >60 mL/min (ref >60)
GFR calc non Af Amer: 52 mL/min — ABNORMAL LOW (ref >60)
Glucose, Bld: 115 mg/dL — ABNORMAL HIGH (ref 70–99)
Glucose, Bld: 112 mg/dL — ABNORMAL HIGH (ref 70–99)
Glucose, Bld: 89 mg/dL (ref 70–99)
Glucose, Bld: 134 mg/dL — ABNORMAL HIGH (ref 70–99)
Potassium: 4 mmol/L (ref 3.5–5.1)
Potassium: 3.3 mmol/L — ABNORMAL LOW (ref 3.5–5.1)
Potassium: 4.2 mmol/L (ref 3.5–5.1)
Potassium: 4.2 mmol/L (ref 3.5–5.1)
Sodium: 121 mmol/L — ABNORMAL LOW (ref 135–145)
Sodium: 131 mmol/L — ABNORMAL LOW (ref 135–145)
Sodium: 132 mmol/L — ABNORMAL LOW (ref 135–145)
Sodium: 134 mmol/L — ABNORMAL LOW (ref 135–145)

## 2018-07-06 LAB — FERRITIN: Ferritin: 28 ng/mL (ref 11–307)

## 2018-07-06 LAB — CBC WITH DIFFERENTIAL/PLATELET
Abs Immature Granulocytes: 0.04 10*3/uL (ref 0.00–0.07)
Basophils Absolute: 0 10*3/uL (ref 0.0–0.1)
Basophils Relative: 1 %
Eosinophils Absolute: 0.2 10*3/uL (ref 0.0–0.5)
Eosinophils Relative: 2 %
HCT: 25 % — ABNORMAL LOW (ref 36.0–46.0)
Hemoglobin: 7.9 g/dL — ABNORMAL LOW (ref 12.0–15.0)
Immature Granulocytes: 1 %
Lymphocytes Relative: 10 %
Lymphs Abs: 0.8 10*3/uL (ref 0.7–4.0)
MCH: 27.9 pg (ref 26.0–34.0)
MCHC: 31.6 g/dL (ref 30.0–36.0)
MCV: 88.3 fL (ref 80.0–100.0)
Monocytes Absolute: 0.7 10*3/uL (ref 0.1–1.0)
Monocytes Relative: 8 %
Neutro Abs: 6.3 10*3/uL (ref 1.7–7.7)
Neutrophils Relative %: 78 %
Platelets: 312 10*3/uL (ref 150–400)
RBC: 2.83 MIL/uL — ABNORMAL LOW (ref 3.87–5.11)
RDW: 16 % — ABNORMAL HIGH (ref 11.5–15.5)
WBC: 8 10*3/uL (ref 4.0–10.5)
nRBC: 0 % (ref 0.0–0.2)

## 2018-07-06 LAB — TSH: TSH: 4.424 u[IU]/mL (ref 0.350–4.500)

## 2018-07-06 LAB — I-STAT TROPONIN, ED: Troponin i, poc: 0 ng/mL (ref 0.00–0.08)

## 2018-07-06 LAB — FOLATE: Folate: 30.5 ng/mL (ref >5.9)

## 2018-07-06 LAB — BRAIN NATRIURETIC PEPTIDE: B Natriuretic Peptide: 363.3 pg/mL — ABNORMAL HIGH (ref 0.0–100.0)

## 2018-07-06 LAB — TROPONIN I
Troponin I: <0.03 ng/mL (ref <0.03)
Troponin I: <0.03 ng/mL (ref <0.03)
Troponin I: <0.03 ng/mL (ref <0.03)

## 2018-07-06 LAB — POC OCCULT BLOOD, ED: Fecal Occult Bld: NEGATIVE

## 2018-07-06 LAB — OSMOLALITY, URINE: Osmolality, Ur: 159 mOsm/kg — ABNORMAL LOW (ref 300–900)

## 2018-07-06 LAB — CORTISOL: Cortisol, Plasma: 15.6 ug/dL

## 2018-07-06 LAB — RETICULOCYTES
Immature Retic Fract: 35.9 % — ABNORMAL HIGH (ref 2.3–15.9)
RBC.: 2.78 MIL/uL — ABNORMAL LOW (ref 3.87–5.11)
Retic Count, Absolute: 109 10*3/uL (ref 19.0–186.0)
Retic Ct Pct: 3.8 % — ABNORMAL HIGH (ref 0.4–3.1)

## 2018-07-06 LAB — SODIUM, URINE, RANDOM: Sodium, Ur: 48 mmol/L

## 2018-07-06 LAB — HEPATIC FUNCTION PANEL
ALT: 14 U/L (ref 0–44)
AST: 17 U/L (ref 15–41)
Albumin: 2.7 g/dL — ABNORMAL LOW (ref 3.5–5.0)
Alkaline Phosphatase: 51 U/L (ref 38–126)
Bilirubin, Direct: <0.1 mg/dL (ref 0.0–0.2)
Total Bilirubin: 0.4 mg/dL (ref 0.3–1.2)
Total Protein: 5.4 g/dL — ABNORMAL LOW (ref 6.5–8.1)

## 2018-07-06 LAB — IRON AND TIBC
Iron: 20 ug/dL — ABNORMAL LOW (ref 28–170)
Saturation Ratios: 6 % — ABNORMAL LOW (ref 10.4–31.8)
TIBC: 312 ug/dL (ref 250–450)
UIBC: 292 ug/dL

## 2018-07-06 LAB — D-DIMER, QUANTITATIVE: D-Dimer, Quant: 2.86 ug/mL-FEU — ABNORMAL HIGH (ref 0.00–0.50)

## 2018-07-06 LAB — VITAMIN B12: Vitamin B-12: 890 pg/mL (ref 180–914)

## 2018-07-06 MED ORDER — LOSARTAN POTASSIUM 50 MG PO TABS
100.0000 mg | ORAL_TABLET | Freq: Every day | ORAL | Status: DC
  Administered 2018-07-06 – 2018-07-07 (×2): 100 mg via ORAL
  Filled 2018-07-06 (×2): qty 2

## 2018-07-06 MED ORDER — CLOPIDOGREL BISULFATE 75 MG PO TABS
75.0000 mg | ORAL_TABLET | Freq: Every day | ORAL | Status: DC
  Administered 2018-07-06 – 2018-07-13 (×8): 75 mg via ORAL
  Filled 2018-07-06 (×8): qty 1

## 2018-07-06 MED ORDER — ONDANSETRON HCL 4 MG/2ML IJ SOLN: 4.0000 mg | Freq: Four times a day (QID) | INTRAMUSCULAR | Status: DC | PRN

## 2018-07-06 MED ORDER — UMECLIDINIUM-VILANTEROL 62.5-25 MCG/INH IN AEPB
1.0000 | INHALATION_SPRAY | Freq: Every day | RESPIRATORY_TRACT | Status: DC
  Administered 2018-07-06 – 2018-07-13 (×8): 1 via RESPIRATORY_TRACT
  Filled 2018-07-06 (×2): qty 14

## 2018-07-06 MED ORDER — CALCIUM CITRATE 950 (200 CA) MG PO TABS
1200.0000 mg | ORAL_TABLET | Freq: Every day | ORAL | Status: DC
  Administered 2018-07-06 – 2018-07-09 (×4): 1200 mg via ORAL
  Filled 2018-07-06 (×8): qty 6

## 2018-07-06 MED ORDER — FERROUS SULFATE 325 (65 FE) MG PO TABS
325.0000 mg | ORAL_TABLET | Freq: Two times a day (BID) | ORAL | Status: DC
  Administered 2018-07-06 – 2018-07-13 (×13): 325 mg via ORAL
  Filled 2018-07-06 (×12): qty 1

## 2018-07-06 MED ORDER — ACETAMINOPHEN 650 MG RE SUPP: 650.0000 mg | Freq: Four times a day (QID) | RECTAL | Status: DC | PRN

## 2018-07-06 MED ORDER — ENSURE ENLIVE PO LIQD
237.0000 mL | Freq: Two times a day (BID) | ORAL | Status: DC
  Administered 2018-07-06 – 2018-07-08 (×3): 237 mL via ORAL

## 2018-07-06 MED ORDER — POTASSIUM CHLORIDE CRYS ER 20 MEQ PO TBCR
40.0000 meq | EXTENDED_RELEASE_TABLET | Freq: Once | ORAL | Status: AC
  Administered 2018-07-06: 40 meq via ORAL
  Filled 2018-07-06: qty 2

## 2018-07-06 MED ORDER — ADULT MULTIVITAMIN W/MINERALS CH
1.0000 | ORAL_TABLET | Freq: Every day | ORAL | Status: DC
  Administered 2018-07-06 – 2018-07-13 (×8): 1 via ORAL
  Filled 2018-07-06 (×8): qty 1

## 2018-07-06 MED ORDER — ROSUVASTATIN CALCIUM 5 MG PO TABS
10.0000 mg | ORAL_TABLET | Freq: Every day | ORAL | Status: DC
  Administered 2018-07-06 – 2018-07-13 (×8): 10 mg via ORAL
  Filled 2018-07-06 (×8): qty 2

## 2018-07-06 MED ORDER — LEVOTHYROXINE SODIUM 88 MCG PO TABS
88.0000 ug | ORAL_TABLET | ORAL | Status: DC
  Administered 2018-07-06 – 2018-07-13 (×8): 88 ug via ORAL
  Filled 2018-07-06 (×8): qty 1

## 2018-07-06 MED ORDER — DOXAZOSIN MESYLATE 8 MG PO TABS
8.0000 mg | ORAL_TABLET | Freq: Every day | ORAL | Status: DC
  Administered 2018-07-06 – 2018-07-07 (×2): 8 mg via ORAL
  Filled 2018-07-06 (×2): qty 1

## 2018-07-06 MED ORDER — ONDANSETRON HCL 4 MG PO TABS: 4.0000 mg | ORAL_TABLET | Freq: Four times a day (QID) | ORAL | Status: DC | PRN

## 2018-07-06 MED ORDER — ACETAMINOPHEN 325 MG PO TABS: 650.0000 mg | ORAL_TABLET | Freq: Four times a day (QID) | ORAL | Status: DC | PRN

## 2018-07-06 MED ORDER — ENOXAPARIN SODIUM 40 MG/0.4ML ~~LOC~~ SOLN
40.0000 mg | SUBCUTANEOUS | Status: DC
  Administered 2018-07-06 – 2018-07-13 (×8): 40 mg via SUBCUTANEOUS
  Filled 2018-07-06 (×9): qty 0.4

## 2018-07-06 MED ORDER — FUROSEMIDE 10 MG/ML IJ SOLN
20.0000 mg | Freq: Two times a day (BID) | INTRAMUSCULAR | Status: DC
  Administered 2018-07-06 (×2): 20 mg via INTRAVENOUS
  Filled 2018-07-06 (×3): qty 2

## 2018-07-06 MED ORDER — AMLODIPINE BESYLATE 10 MG PO TABS
10.0000 mg | ORAL_TABLET | Freq: Every day | ORAL | Status: DC
  Administered 2018-07-06 – 2018-07-07 (×2): 10 mg via ORAL
  Filled 2018-07-06 (×2): qty 1

## 2018-07-06 MED ORDER — ASPIRIN 81 MG PO CHEW
81.0000 mg | CHEWABLE_TABLET | Freq: Every day | ORAL | Status: DC
  Administered 2018-07-06 – 2018-07-13 (×8): 81 mg via ORAL
  Filled 2018-07-06 (×8): qty 1

## 2018-07-06 MED ORDER — IOPAMIDOL (ISOVUE-370) INJECTION 76%
INTRAVENOUS | Status: AC
  Administered 2018-07-06: 100 mL
  Filled 2018-07-06: qty 100

## 2018-07-06 MED ORDER — FUROSEMIDE 10 MG/ML IJ SOLN
20.0000 mg | INTRAMUSCULAR | Status: AC
  Administered 2018-07-06: 20 mg via INTRAVENOUS
  Filled 2018-07-06: qty 2

## 2018-07-06 MED ORDER — TAMOXIFEN CITRATE 10 MG PO TABS
20.0000 mg | ORAL_TABLET | Freq: Every day | ORAL | Status: DC
  Administered 2018-07-06 – 2018-07-13 (×8): 20 mg via ORAL
  Filled 2018-07-06 (×8): qty 2

## 2018-07-06 NOTE — H&P (Addendum)
History and Physical    Kendra Watts TIW:[id number] DOB: 1945-12-23 DOA: 07/06/2018  PCP: Merrilee Seashore, MD  Patient coming from: Home.  Chief Complaint: Shortness of breath.  HPI: Kendra Watts is a 72 y.o. female with history of COPD, hypothyroidism, hyperlipidemia, hypertension has been experiencing shortness of breath for the last 2 weeks.  Shortness of breath is mostly exertional.  And also noticed bilateral lower extremity edema.  Denies any chest pain.  Had gone to her PCP and was found to have low sodium and also hemoglobin which has been worked up.  Since patient shortness of breath getting worse patient came to the ER.  Patient not noticed any blood in the stools.  ED Course: In the ER chest x-ray shows bilateral pleural effusion and congestion BNP of 363 hemoglobin of 7.9 stool for blood was negative.  EKG shows normal sinus rhythm.  Was given Lasix 20 mg IV.  Patient also has severe hyponatremia of 121 likely from fluid overload.  Review of Systems: As per HPI, rest all negative.   Past Medical History:  Diagnosis Date  . Anemia   . 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 Breast cancer ([place]) 09/22/2015   Malignant  . Breast cancer of upper-outer quadrant of left female breast ([place]) 09/08/2015  . Cataract, immature   . Heart murmur    states no known problems  . History of hyperthyroidism   . Hyperlipidemia   . Hypertension    states under control with meds., has been on med. x "long time"  . Hypothyroidism   . Nonfunctioning kidney    left  . Personal history of radiation therapy    2017  . Radiation 10/30/15-11/28/15   left breast 42.72 Gy, boosted to 10 Gy  . Renal artery stenosis ([place])   . Tobacco abuse   . Wears partial dentures    upper and lower    Past Surgical History:  Procedure Laterality Date  . ABDOMINAL ANGIOGRAM  02/18/2012   Procedure: ABDOMINAL ANGIOGRAM;  Surgeon: Lorretta Harp, MD;  Location: [facility] CATH LAB;  Service:  Cardiovascular;;  . ABDOMINAL AORTAGRAM  07/04/2014  . ABDOMINAL HYSTERECTOMY  ~ 1977   partial  . APPENDECTOMY    . ARCH AORTOGRAM    . BREAST LUMPECTOMY Left 09/22/2015   Malignant  . CAROTID ANGIOGRAM N/A 02/18/2012   Procedure: CAROTID ANGIOGRAM;  Surgeon: Lorretta Harp, MD;  Location: [facility] CATH LAB;  Service: Cardiovascular;  Laterality: N/A;  . ENDARTERECTOMY  04/02/2012   Procedure: ENDARTERECTOMY CAROTID;  Surgeon: Serafina Mitchell, MD;  Location: [facility] OR;  Service: Vascular;  Laterality: Right;  . RADIOACTIVE SEED GUIDED PARTIAL MASTECTOMY WITH AXILLARY SENTINEL LYMPH NODE BIOPSY Left 09/22/2015   Procedure: INJECT BLUE DYE LEFT BREAST,RADIOACTIVE SEED GUIDED PARTIAL MASTECTOMY WITH AXILLARY SENTINEL LYMPH NODE BIOPSY;  Surgeon: Fanny Skates, MD;  Location: [facility];  Service: General;  Laterality: Left;  . RENAL ANGIOGRAM Left 06/08/2010   renal artery stent -  5x12 Genesis on Aviator balloon stent (Dr. Adora Fridge)  . RENAL ANGIOGRAM Right 07/04/2014   Procedure: RENAL ANGIOGRAM;  Surgeon: Lorretta Harp, MD;  Location: [facility] CATH LAB;  Service: Cardiovascular;  Laterality: Right;  . RENAL ANGIOGRAM Right 08/22/2014   Procedure: RENAL ANGIOGRAM;  Surgeon: Lorretta Harp, MD;  Location: [facility] CATH LAB;  Service: Cardiovascular;  Laterality: Right;  . TONSILLECTOMY     as a child     reports that  she has been smoking cigarettes. She has a 60.00 pack-year smoking history. She has never used smokeless tobacco. She reports current alcohol use. She reports that she does not use drugs.  No Known Allergies  Family History  Problem Relation Age of Onset  . Heart disease Mother        MI @ 70, died at 65  . Cancer Father   . Lung cancer Father   . Heart disease Maternal Grandmother   . Lung cancer Sister   . Breast cancer Paternal Aunt     Prior to Admission medications   Medication Sig Start Date End Date Taking? Authorizing Provider  amLODipine (NORVASC) 10 MG tablet  Take 10 mg by mouth daily.    Yes [provider]  aspirin 81 MG tablet Take 81 mg by mouth daily.   Yes [provider]  calcium citrate (CALCITRATE - DOSED IN MG ELEMENTAL CALCIUM) 950 MG tablet Take 1,200 mg of elemental calcium by mouth daily.   Yes [provider]  clopidogrel (PLAVIX) 75 MG tablet TAKE 1 TABLET BY MOUTH  DAILY Patient taking differently: Take 75 mg by mouth daily.  07/03/18  Yes Lorretta Harp, MD  doxazosin (CARDURA) 8 MG tablet Take 8 mg by mouth daily.    Yes [provider]  levothyroxine (SYNTHROID, LEVOTHROID) 88 MCG tablet Take 88 mcg by mouth daily before breakfast.   Yes [provider]  losartan (COZAAR) 100 MG tablet Take 100 mg by mouth daily.  04/12/14  Yes [provider]  Multiple Vitamins-Minerals (MULTIVITAMIN WITH MINERALS) tablet Take 1 tablet by mouth daily.    Yes [provider]  rosuvastatin (CRESTOR) 10 MG tablet Take 10 mg by mouth daily.    Yes [provider]  tamoxifen (NOLVADEX) 20 MG tablet TAKE 1 TABLET BY MOUTH  DAILY Patient taking differently: Take 20 mg by mouth daily.  11/03/17  Yes Nicholas Lose, MD  umeclidinium-vilanterol (ANORO ELLIPTA) 62.5-25 MCG/INH AEPB Inhale 1 puff into the lungs daily.   Yes [provider]    Physical Exam: Vitals:   07/06/18 0215 07/06/18 0230 07/06/18 0245 07/06/18 0331  BP: 105/88 (!) 138/59 (!) 146/53 (!) 151/60  Pulse: 81 72 73 88  Resp: 18 15 17 18   Temp:    98.2 F (36.8 C)  TempSrc:    Oral  SpO2: 90% 93% 91% 91%  Weight:    44 kg  Height:    5' (1.524 m)      Constitutional: Moderately built and nourished. Vitals:   07/06/18 0215 07/06/18 0230 07/06/18 0245 07/06/18 0331  BP: 105/88 (!) 138/59 (!) 146/53 (!) 151/60  Pulse: 81 72 73 88  Resp: 18 15 17 18   Temp:    98.2 F (36.8 C)  TempSrc:    Oral  SpO2: 90% 93% 91% 91%  Weight:    44 kg  Height:    5' (1.524 m)   Eyes: Anicteric no pallor. ENMT:  No discharge from the ears eyes nose or mouth. Neck: No mass felt.  No neck rigidity.  No JVD appreciated. Respiratory: Mild expiratory wheeze and no crepitations. Cardiovascular: S1-S2 heard. Abdomen: Soft nontender bowel sounds present. Musculoskeletal: Mild edema of the both lower extremities. Skin: No rash. Neurologic: Alert awake oriented to time place and person.  Moves all extremities. Psychiatric: Appears normal.  Normal affect.   Labs on Admission: I have personally reviewed following labs and imaging studies  CBC: Recent Labs  Lab 07/06/18  0058  WBC 8.0  NEUTROABS 6.3  HGB 7.9*  HCT 25.0*  MCV 88.3  PLT 443   Basic Metabolic Panel: Recent Labs  Lab 07/06/18 0058  NA 121*  K 4.0  CL 87*  CO2 22  GLUCOSE 115*  BUN 7*  CREATININE 0.56  CALCIUM 8.3*   GFR: Estimated Creatinine Clearance: 44.2 mL/min (by C-G formula based on SCr of 0.56 mg/dL). Liver Function Tests: No results for input(s): AST, ALT, ALKPHOS, BILITOT, PROT, ALBUMIN in the last 168 hours. No results for input(s): LIPASE, AMYLASE in the last 168 hours. No results for input(s): AMMONIA in the last 168 hours. Coagulation Profile: No results for input(s): INR, PROTIME in the last 168 hours. Cardiac Enzymes: No results for input(s): CKTOTAL, CKMB, CKMBINDEX, TROPONINI in the last 168 hours. BNP (last 3 results) No results for input(s): PROBNP in the last 8760 hours. HbA1C: No results for input(s): HGBA1C in the last 72 hours. CBG: No results for input(s): GLUCAP in the last 168 hours. Lipid Profile: No results for input(s): CHOL, HDL, LDLCALC, TRIG, CHOLHDL, LDLDIRECT in the last 72 hours. Thyroid Function Tests: No results for input(s): TSH, T4TOTAL, FREET4, T3FREE, THYROIDAB in the last 72 hours. Anemia Panel: Recent Labs    07/06/18 0215  VITAMINB12 890  FOLATE 30.5  FERRITIN 28  TIBC 312  IRON 20*  RETICCTPCT 3.8*   Urine analysis:    Component Value Date/Time   COLORURINE  YELLOW 03/27/2012 1119   APPEARANCEUR CLEAR 03/27/2012 1119   LABSPEC 1.024 03/27/2012 1119   PHURINE 5.5 03/27/2012 1119   GLUCOSEU NEGATIVE 03/27/2012 1119   HGBUR NEGATIVE 03/27/2012 1119   BILIRUBINUR NEGATIVE 03/27/2012 1119   KETONESUR 15 (A) 03/27/2012 1119   PROTEINUR NEGATIVE 03/27/2012 1119   UROBILINOGEN 1.0 03/27/2012 1119   NITRITE NEGATIVE 03/27/2012 1119   LEUKOCYTESUR MODERATE (A) 03/27/2012 1119   Sepsis Labs: @LABRCNTIP (procalcitonin:4,lacticidven:4) )No results found for this or any previous visit (from the past 240 hour(s)).   Radiological Exams on Admission: Dg Chest 2 View  Result Date: 07/06/2018 CLINICAL DATA:  Cough, shortness of breath, peripheral edema. EXAM: CHEST - 2 VIEW COMPARISON:  AP view 07/13/2018 FINDINGS: Bilateral pleural effusions, left greater than right with associated atelectasis. Increased interstitial opacities above baseline consistent with mild pulmonary edema. Unchanged heart size and mediastinal contours with aortic atherosclerosis. Chronic hyperinflation. No pneumothorax. Bones are under mineralized. Postsurgical change of the left breast and axilla. IMPRESSION: Findings consistent with CHF with bilateral pleural effusions and mild pulmonary edema. Electronically Signed   By: Keith Rake M.D.   On: 07/06/2018 01:36    EKG: Independently reviewed.  Normal sinus rhythm.  Assessment/Plan Principal Problem:   Acute respiratory failure with hypoxia (HCC) Active Problems:   Peripheral arterial disease (HCC)   Essential hypertension   Breast cancer of upper-outer quadrant of left female breast ([place])   Acute CHF (congestive heart failure) (HCC)   Hyponatremia   Anemia   COPD with acute bronchitis ([place])    1. Acute respiratory failure with hypoxia likely secondary to new onset of CHF with possible contribution from COPD.  Patient has been placed on Lasix 20 g IV every 12.  Based on response can increase dose further.  Check 2D echo  cycle cardiac markers check d-dimer. 2. Severe hyponatremia could be from fluid overload.  Check urine studies TSH cortisol.  Closely follow metabolic panel.  If it is due to fluid overload sodium should improve with diuresis. 3. Chronic anemia with worsening  with stool for occult blood negative.  Check anemia panel.  Anemia may also be contributing to patient's shortness of breath. 4. COPD we will continue home inhalers. 5. Hypothyroidism on Synthroid. 6. Hyperlipidemia on statins. 7. Hypertension on Cardura amlodipine and losartan. 8. History of breast cancer on tamoxifen.  Check d-dimer.   DVT prophylaxis: Lovenox. Code Status: Full code. Family Communication: Discussed with patient. Disposition Plan: Home. Consults called: None. Admission status: Inpatient.   Rise Patience MD [facility] Pager [phone].  If 7PM-7AM, please contact night-coverage [email or web address] Password [email or web address]  07/06/2018, 5:35 AM

## 2018-07-06 NOTE — Progress Notes (Signed)
  Echocardiogram 2D Echocardiogram has been performed.  Darlina Sicilian M 07/06/2018, 11:14 AM

## 2018-07-06 NOTE — Plan of Care (Signed)
  Problem: Clinical Measurements: Goal: Ability to maintain clinical measurements within normal limits will improve Outcome: Progressing   Problem: Coping: Goal: Level of anxiety will decrease Outcome: Progressing   Problem: Pain Managment: Goal: General experience of comfort will improve Outcome: Progressing   Problem: Safety: Goal: Ability to remain free from injury will improve Outcome: Progressing   Problem: Skin Integrity: Goal: Risk for impaired skin integrity will decrease Outcome: Progressing   Problem: Education: Goal: Knowledge of General Education information will improve Description Including pain rating scale, medication(s)/side effects and non-pharmacologic comfort measures Outcome: Not Met (add Reason)   Problem: Health Behavior/Discharge Planning: Goal: Ability to manage health-related needs will improve Outcome: Not Met (add Reason)   Problem: Clinical Measurements: Goal: Will remain free from infection Outcome: Not Met (add Reason) Goal: Diagnostic test results will improve Outcome: Not Met (add Reason) Goal: Respiratory complications will improve Outcome: Not Met (add Reason) Goal: Cardiovascular complication will be avoided Outcome: Not Met (add Reason)   Problem: Activity: Goal: Risk for activity intolerance will decrease Outcome: Not Met (add Reason)   Problem: Nutrition: Goal: Adequate nutrition will be maintained Outcome: Not Met (add Reason)   Problem: Elimination: Goal: Will not experience complications related to bowel motility Outcome: Not Met (add Reason) Goal: Will not experience complications related to urinary retention Outcome: Not Met (add Reason)

## 2018-07-06 NOTE — Progress Notes (Signed)
Initial Nutrition Assessment  DOCUMENTATION CODES:   Not applicable  INTERVENTION:   -Ensure Enlive po BID, each supplement provides 350 kcal and 20 grams of protein -MVI with minerals daily  NUTRITION DIAGNOSIS:   Increased nutrient needs related to chronic illness(COPD) as evidenced by estimated needs.  GOAL:   Patient will meet greater than or equal to 90% of their needs  MONITOR:   PO intake, Supplement acceptance, Labs, Weight trends, Skin, I & O's  REASON FOR ASSESSMENT:   Malnutrition Screening Tool    ASSESSMENT:   Kendra Watts is a 72 y.o. female with history of COPD, hypothyroidism, hyperlipidemia, hypertension has been experiencing shortness of breath for the last 2 weeks.  Shortness of breath is mostly exertional.  And also noticed bilateral lower extremity edema.  Denies any chest pain.  Had gone to her PCP and was found to have low sodium and also hemoglobin which has been worked up.  Since patient shortness of breath getting worse patient came to the ER.  Patient not noticed any blood in the stools.  Pt admitted with acute respiratory failure related to CHF vs COPD.   Spoke with pt at bedside, who reports decreased appetite over the past few months. She reports that she has never been a "big eater", however, intake has declined related to early satiety. She shares that she stopped eating breakfast about a year ago, due to her thyroid medication (pt reports this medications has many food and drug interactions, especially dairy products and stopped eating breakfast due to not being able to consume items that appeal to her). Typical intake consists of lunch of steak sandwich and dinner of a frozen dinner. Pt also consumes one Ensure supplement daily.   Pt reports that her UBW is around 104#. She reports losing about 16# when her thyroid medication was adjusted and has been unable to regain lost weight. However  Per wt hx, no weight loss has been reflected. However,  pt with mild edema which may be masking true wt loss.   Discussed with pt importance of good meal and supplement intake to promote healing. Pt amenable to resume Ensure supplements.   Labs reviewed: Na: 132.   NUTRITION - FOCUSED PHYSICAL EXAM:    Most Recent Value  Orbital Region  No depletion  Upper Arm Region  Moderate depletion  Thoracic and Lumbar Region  No depletion  Buccal Region  No depletion  Temple Region  No depletion  Clavicle Bone Region  Mild depletion  Clavicle and Acromion Bone Region  Mild depletion  Scapular Bone Region  Mild depletion  Dorsal Hand  Moderate depletion  Patellar Region  Mild depletion  Anterior Thigh Region  Mild depletion  Posterior Calf Region  Mild depletion  Edema (RD Assessment)  Mild  Hair  Reviewed  Eyes  Reviewed  Mouth  Reviewed  Skin  Reviewed  Nails  Reviewed       Diet Order:   Diet Order            Diet Heart Room service appropriate? Yes; Fluid consistency: Thin; Fluid restriction: 1200 mL Fluid  Diet effective now              EDUCATION NEEDS:   Education needs have been addressed  Skin:  Skin Assessment: Skin Integrity Issues: Skin Integrity Issues:: Incisions Incisions: closed lt breast incision  Last BM:  07/05/18  Height:   Ht Readings from Last 1 Encounters:  07/06/18 5' (1.524 m)  Weight:   Wt Readings from Last 1 Encounters:  07/06/18 44 kg    Ideal Body Weight:  45.5 kg  BMI:  Body mass index is 18.96 kg/m.  Estimated Nutritional Needs:   Kcal:  1400-1600  Protein:  60-75 grams  Fluid:  1.4-1.6 L    Omero Kowal A. Jimmye Norman, RD, LDN, CDE Pager: [phone] After hours Pager: [phone]

## 2018-07-06 NOTE — ED Provider Notes (Signed)
[facility] EMERGENCY DEPARTMENT Provider Note   CSN: [id number] Arrival date & time: 07/06/18  0042    History   Chief Complaint Chief Complaint  Patient presents with  . Shortness of Breath    HPI Kendra Watts is a 72 y.o. female.  The history is provided by the patient and medical records.  Shortness of Breath  Associated symptoms include cough and leg swelling.     72 y.o. F with hx of breast cancer s/p resection and lymph node biopsy, anemia, aortic arch anomaly, cataracts, HLP, HTN, hypothyroidism, tobacco abuse disorder, presenting to the ED for SOB.  This has been progressively worsening for the past few days.  States she decided to come in tonight because it is getting to the point that she can barely make it across the room when walking.  She does report cough but that is not unusual for her.  Has some clear mucous with it.  Denies fever, chills, sweats, or other URI symptoms.  She has also had increased LE edema over the past few weeks.  States she has been seeing her PCP for anemia and low sodium, states she had more labs drawn Friday and is waiting for results.  She has no known hx of CHF or other cardiac problems.  She is a daily smoker.  O2 sats 68% on RA on arrival to ED, improved to low 90's with 3L O2.  Generally does not require home oxygen.  Past Medical History:  Diagnosis Date  . Anemia   . 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 Breast cancer ([place]) 09/22/2015   Malignant  . Breast cancer of upper-outer quadrant of left female breast ([place]) 09/08/2015  . Cataract, immature   . Heart murmur    states no known problems  . History of hyperthyroidism   . Hyperlipidemia   . Hypertension    states under control with meds., has been on med. x "long time"  . Hypothyroidism   . Nonfunctioning kidney    left  . Personal history of radiation therapy    2017  . Radiation 10/30/15-11/28/15   left breast 42.72 Gy, boosted to 10 Gy  . Renal  artery stenosis ([place])   . Tobacco abuse   . Wears partial dentures    upper and lower    Patient Active Problem List   Diagnosis Date Noted  . Breast cancer of upper-outer quadrant of left female breast ([place]) 09/08/2015  . Renal artery stenosis ([place]) 08/23/2014  . Right upper extremity numbness 07/01/2013  . Atherosclerotic renal artery stenosis, bilateral ([place]) 05/26/2013  . Peripheral arterial disease ([place]) 05/26/2013  . Essential hypertension 05/26/2013  . Hyperlipidemia 05/26/2013  . Tobacco abuse 05/26/2013  . Carotid artery stenosis 03/09/2012    Past Surgical History:  Procedure Laterality Date  . ABDOMINAL ANGIOGRAM  02/18/2012   Procedure: ABDOMINAL ANGIOGRAM;  Surgeon: Lorretta Harp, MD;  Location: [facility] CATH LAB;  Service: Cardiovascular;;  . ABDOMINAL AORTAGRAM  07/04/2014  . ABDOMINAL HYSTERECTOMY  ~ 1977   partial  . APPENDECTOMY    . ARCH AORTOGRAM    . BREAST LUMPECTOMY Left 09/22/2015   Malignant  . CAROTID ANGIOGRAM N/A 02/18/2012   Procedure: CAROTID ANGIOGRAM;  Surgeon: Lorretta Harp, MD;  Location: [facility] CATH LAB;  Service: Cardiovascular;  Laterality: N/A;  . ENDARTERECTOMY  04/02/2012   Procedure: ENDARTERECTOMY CAROTID;  Surgeon: Serafina Mitchell, MD;  Location: [facility];  Service: Vascular;  Laterality: Right;  . RADIOACTIVE SEED GUIDED PARTIAL MASTECTOMY WITH AXILLARY SENTINEL LYMPH NODE BIOPSY Left 09/22/2015   Procedure: INJECT BLUE DYE LEFT BREAST,RADIOACTIVE SEED GUIDED PARTIAL MASTECTOMY WITH AXILLARY SENTINEL LYMPH NODE BIOPSY;  Surgeon: Fanny Skates, MD;  Location: [facility];  Service: General;  Laterality: Left;  . RENAL ANGIOGRAM Left 06/08/2010   renal artery stent -  5x12 Genesis on Aviator balloon stent (Dr. Adora Fridge)  . RENAL ANGIOGRAM Right 07/04/2014   Procedure: RENAL ANGIOGRAM;  Surgeon: Lorretta Harp, MD;  Location: [facility] CATH LAB;  Service: Cardiovascular;  Laterality: Right;  . RENAL ANGIOGRAM Right 08/22/2014   Procedure:  RENAL ANGIOGRAM;  Surgeon: Lorretta Harp, MD;  Location: [facility] CATH LAB;  Service: Cardiovascular;  Laterality: Right;  . TONSILLECTOMY     as a child     OB History   No obstetric history on file.      Home Medications    Prior to Admission medications   Medication Sig Start Date End Date Taking? Authorizing Provider  amLODipine (NORVASC) 10 MG tablet Take 10 mg by mouth every morning.    [provider]  aspirin 81 MG tablet Take 81 mg by mouth daily.    [provider]  calcium citrate (CALCITRATE - DOSED IN MG ELEMENTAL CALCIUM) 950 MG tablet Take 1,200 mg of elemental calcium by mouth daily.    [provider]  clopidogrel (PLAVIX) 75 MG tablet TAKE 1 TABLET BY MOUTH  DAILY 07/03/18   Lorretta Harp, MD  doxazosin (CARDURA) 8 MG tablet Take 8 mg by mouth every morning.      [provider]  levothyroxine (SYNTHROID, LEVOTHROID) 88 MCG tablet Take 88 mcg by mouth daily before breakfast.    [provider]  losartan (COZAAR) 100 MG tablet Take 100 mg by mouth every morning.  04/12/14   [provider]  Multiple Vitamins-Minerals (MULTIVITAMIN WITH MINERALS) tablet Take 1 tablet by mouth every morning.     [provider]  rosuvastatin (CRESTOR) 10 MG tablet Take 10 mg by mouth every morning.     [provider]  tamoxifen (NOLVADEX) 20 MG tablet TAKE 1 TABLET BY MOUTH  DAILY Patient taking differently: Take 20 mg by mouth daily.  11/03/17   Nicholas Lose, MD  umeclidinium-vilanterol (ANORO ELLIPTA) 62.5-25 MCG/INH AEPB Inhale 1 puff into the lungs daily.    [provider]    Family History Family History  Problem Relation Age of Onset  . Heart disease Mother        MI @ 85, died at 29  . Cancer Father   . Lung cancer Father   . Heart disease Maternal Grandmother   . Lung cancer Sister   . Breast cancer Paternal Aunt     Social History Social History   Tobacco Use  . Smoking status:  Current Every Day Smoker    Packs/day: 1.50    Years: 40.00    Pack years: 60.00    Types: Cigarettes  . Smokeless tobacco: Never Used  Substance Use Topics  . Alcohol use: Yes    Comment: occasionally  . Drug use: No     Allergies   Patient has no known allergies.   Review of Systems Review of Systems  Respiratory: Positive for cough and shortness of breath.   Cardiovascular: Positive for leg swelling.  All other systems reviewed and are negative.    Physical Exam Updated Vital Signs BP (!) 162/65 (BP Location:  Left Arm)   Pulse 86   Temp 98.2 F (36.8 C) (Oral)   Resp (!) 22   Ht 5' (1.524 m)   Wt 39 kg   SpO2 (!) 89%   BMI 16.79 kg/m   Physical Exam Vitals signs and nursing note reviewed.  Constitutional:      Appearance: She is well-developed.  HENT:     Head: Normocephalic and atraumatic.  Eyes:     Conjunctiva/sclera: Conjunctivae normal.     Pupils: Pupils are equal, round, and reactive to light.  Neck:     Musculoskeletal: Normal range of motion.  Cardiovascular:     Rate and Rhythm: Normal rate and regular rhythm.     Heart sounds: Normal heart sounds.  Pulmonary:     Effort: Pulmonary effort is normal.     Breath sounds: Normal breath sounds. No decreased breath sounds or wheezing.     Comments: O2 sats around 89% on 3L during exam, no acute distress, speaking in full sentences without difficulty  Abdominal:     General: Bowel sounds are normal.     Palpations: Abdomen is soft.  Genitourinary:    Comments: Normal rectum, no gross blood on DRE, no external or internal hemorrhoids noted Musculoskeletal: Normal range of motion.     Comments: 2+ pitting edema at the ankles  Skin:    General: Skin is warm and dry.  Neurological:     Mental Status: She is alert and oriented to person, place, and time.      ED Treatments / Results  Labs (all labs ordered are listed, but only abnormal results are displayed) Labs Reviewed  CBC WITH  DIFFERENTIAL/PLATELET - Abnormal; Notable for the following components:      Result Value   RBC 2.83 (*)    Hemoglobin 7.9 (*)    HCT 25.0 (*)    RDW 16.0 (*)    All other components within normal limits  BASIC METABOLIC PANEL - Abnormal; Notable for the following components:   Sodium 121 (*)    Chloride 87 (*)    Glucose, Bld 115 (*)    BUN 7 (*)    Calcium 8.3 (*)    All other components within normal limits  BRAIN NATRIURETIC PEPTIDE  VITAMIN B12  FOLATE  IRON AND TIBC  FERRITIN  RETICULOCYTES  I-STAT TROPONIN, ED  POC OCCULT BLOOD, ED  TYPE AND SCREEN    EKG EKG Interpretation  Date/Time:  Monday July 06 2018 00:52:21 EST Ventricular Rate:  83 PR Interval:    QRS Duration: 102 QT Interval:  381 QTC Calculation: 448 R Axis:   74 Text Interpretation:  Sinus rhythm Ventricular premature complex Anterior infarct, old No old tracing to compare No acute changes Confirmed by Varney Biles [phone]) on 07/06/2018 1:02:06 AM   Radiology Dg Chest 2 View  Result Date: 07/06/2018 CLINICAL DATA:  Cough, shortness of breath, peripheral edema. EXAM: CHEST - 2 VIEW COMPARISON:  AP view 07/13/2018 FINDINGS: Bilateral pleural effusions, left greater than right with associated atelectasis. Increased interstitial opacities above baseline consistent with mild pulmonary edema. Unchanged heart size and mediastinal contours with aortic atherosclerosis. Chronic hyperinflation. No pneumothorax. Bones are under mineralized. Postsurgical change of the left breast and axilla. IMPRESSION: Findings consistent with CHF with bilateral pleural effusions and mild pulmonary edema. Electronically Signed   By: Keith Rake M.D.   On: 07/06/2018 01:36    Procedures Procedures (including critical care time)  CRITICAL CARE Performed by: Larene Pickett  Total critical care time: 35 minutes  Critical care time was exclusive of separately billable procedures and treating other  patients.  Critical care was necessary to treat or prevent imminent or life-threatening deterioration.  Critical care was time spent personally by me on the following activities: development of treatment plan with patient and/or surrogate as well as nursing, discussions with consultants, evaluation of patient's response to treatment, examination of patient, obtaining history from patient or surrogate, ordering and performing treatments and interventions, ordering and review of laboratory studies, ordering and review of radiographic studies, pulse oximetry and re-evaluation of patient's condition.   Medications Ordered in ED Medications - No data to display   Initial Impression / Assessment and Plan / ED Course  I have reviewed the triage vital signs and the nursing notes.  Pertinent labs & imaging results that were available during my care of the patient were reviewed by me and considered in my medical decision making (see chart for details).  72 year old female here with progressively worsening shortness of breath over the past week.  Has started to have some peripheral edema as well.  She has no known cardiac history.  Has been following with PCP for anemia as well as hyponatremia.  She is afebrile and nontoxic in appearance.  She is satting in the low 90s on 4 L oxygen.  She does not generally require O2.  Lungs with some coarse breath sounds, some rales at the bases.  Does have 2+ pitting edema at the ankles as well.  Labs reviewed, does have worsening anemia with hemoglobin of 7.9 today.  Hemoccult is negative.  Anemia panel was sent.  She also has worsening hypo-natremia with a sodium of 121 today.  She is not have any tremors or seizure activity.  Chest x-ray today with pleural effusions as well as pulmonary edema.  Given gentle Lasix given her hyponatremia.  Given the nature of her symptoms, consider SIADH.  Patient does have history of breast cancer and longstanding heavy smoking history.   Patient will require admission, given gentle IV lasix.  Discussed with Dr. Hal Hope-- will admit for ongoing care.  Final Clinical Impressions(s) / ED Diagnoses   Final diagnoses:  Acute pulmonary edema (HCC)  Anemia, unspecified type  Cough  Hyponatremia    ED Discharge Orders    None       Larene Pickett, PA-C 07/06/18 [id number]    Varney Biles, MD 07/06/18 [phone]

## 2018-07-06 NOTE — ED Triage Notes (Signed)
Pt transported from home by EMS for progressive shob for last several days. RA sat 77%, up to 92% on 4 L [place].  Increased LE swelling, pt no lasix, pt seen recently for anemia.

## 2018-07-06 NOTE — Progress Notes (Signed)
Patient d-dimer 2.86 and potassium 3.3.  Dr. [place] Crane made aware. New orders received. Will continue to monitor.

## 2018-07-06 NOTE — Progress Notes (Addendum)
Kendra Watts is a 72 y.o. female with history of COPD, hypothyroidism, hyperlipidemia, hypertension has been experiencing shortness of breath for the last 2 weeks.  Shortness of breath is mostly exertional.  And also noticed bilateral lower extremity edema.  Denies any chest pain.  Had gone to her PCP and was found to have low sodium and also hemoglobin which has been worked up.  Since patient shortness of breath getting worse patient came to the ER.  Patient not noticed any blood in the stools.  Admitted for acute respiratory failure and hypervolemic hyponatremia.  07/06/18: Patient seen and examined at bedside.  Reports persistent dyspnea with minimal movement.  Not on oxygen supplementation at home.  Now requiring 6 L to maintain O2 saturation greater than 92%. 2D echo revealed normal LVEF 60 to 65%.  Elevated d-dimer 2.86.  Independently reviewed CT chest angiogram which revealed bilateral pleural effusion worse on the left.  Lesion noted on the right middle lobe.  Awaiting official read from radiologist.  Admits to current tobacco use more than 1/2 pack/day for at least 26 years.  Please refer to H&P dictated by Dr. Hal Hope on 07/06/2018 for further details of the assessment and plan.

## 2018-07-07 LAB — BLOOD GAS, ARTERIAL
Acid-Base Excess: 4.3 mmol/L — ABNORMAL HIGH (ref 0.0–2.0)
Bicarbonate: 28.1 mmol/L — ABNORMAL HIGH (ref 20.0–28.0)
Drawn by: 35849
O2 Content: 8 L/min
O2 Saturation: 97 %
Patient temperature: 98.6
pCO2 arterial: 40.5 mmHg (ref 32.0–48.0)
pH, Arterial: 7.456 — ABNORMAL HIGH (ref 7.350–7.450)
pO2, Arterial: 79.6 mmHg — ABNORMAL LOW (ref 83.0–108.0)

## 2018-07-07 LAB — CBC
HCT: 24.3 % — ABNORMAL LOW (ref 36.0–46.0)
Hemoglobin: 7.6 g/dL — ABNORMAL LOW (ref 12.0–15.0)
MCH: 27.9 pg (ref 26.0–34.0)
MCHC: 31.3 g/dL (ref 30.0–36.0)
MCV: 89.3 fL (ref 80.0–100.0)
Platelets: 303 10*3/uL (ref 150–400)
RBC: 2.72 MIL/uL — ABNORMAL LOW (ref 3.87–5.11)
RDW: 16.1 % — ABNORMAL HIGH (ref 11.5–15.5)
WBC: 8.1 10*3/uL (ref 4.0–10.5)
nRBC: 0 % (ref 0.0–0.2)

## 2018-07-07 LAB — PROCALCITONIN: Procalcitonin: <0.1 ng/mL

## 2018-07-07 MED ORDER — SODIUM CHLORIDE 0.9 % IV BOLUS
250.0000 mL | Freq: Once | INTRAVENOUS | Status: AC
  Administered 2018-07-07: 250 mL via INTRAVENOUS

## 2018-07-07 MED ORDER — ALBUMIN HUMAN 25 % IV SOLN
50.0000 g | Freq: Four times a day (QID) | INTRAVENOUS | Status: AC
  Administered 2018-07-07 – 2018-07-08 (×3): 50 g via INTRAVENOUS
  Filled 2018-07-07 (×5): qty 200

## 2018-07-07 NOTE — Progress Notes (Signed)
Patient has manual blood pressure of 70/50. Patient is asymptomatic. Lasix 20mg  IV push held this morning. On call for [facility] notified.

## 2018-07-07 NOTE — Progress Notes (Signed)
Patient 02 sat 76% on 2l. RT placed patient on HFNC (salter) at 8lpm. Patient 02 sats 90%. RT will continue to monitor.

## 2018-07-07 NOTE — Progress Notes (Signed)
PROGRESS NOTE  Kendra Watts LOV:[id number] DOB: 1945-12-04 DOA: 07/06/2018 PCP: Merrilee Seashore, MD  HPI/Recap of past 24 hours: Kendra Watts a 72 y.o.femalewithhistory of COPD, hypothyroidism, hyperlipidemia, hypertension has been experiencing shortness of breath for the last 2 weeks. Shortness of breath is mostly exertional. And also noticed bilateral lower extremity edema. Denies any chest pain. Had gone to her PCP and was found to have low sodium and also hemoglobin which has been worked up. Since patient shortness of breath getting worse patient came to the ER. Patient not noticed any blood in the stools.  Admitted for acute hypoxic respiratory failure and hypervolemic hyponatremia.  Hospital course complicated by persistent dyspnea at rest.  CT angio PE ordered due to elevated d-dimer 2.86.  This revealed no pulmonary embolism however there were incidental findings of irregular 2.7 cm nodular focus of consolidation in the posterior left upper lobe with surrounding patchy groundglass opacity and spiculated 1.8 cm solid apical right upper lobe pulmonary nodule with morphologic suspicious for primary bronchogenic carcinoma Recommendations for follow-up in 1 month  Admits to current tobacco use more than 1/2 pack/day for at least 50 years.   07/07/18: Patient seen and examined at her bedside.  Report dyspnea at rest.  Requiring 6 L of oxygen by nasal cannula.  Not previously on supplemental oxygen.  Assessment/Plan: Principal Problem:   Acute respiratory failure with hypoxia (HCC) Active Problems:   Peripheral arterial disease (HCC)   Essential hypertension   Breast cancer of upper-outer quadrant of left female breast ([place])   Acute CHF (congestive heart failure) (HCC)   Hyponatremia   Anemia   COPD with acute bronchitis (HCC)  Acute hypoxic tori failure secondary to bilateral pleural effusions versus COPD exacerbation versus others Independently reviewed CTA  PE which was negative for PE but with pulmonary nodules on left and right lungs.  Recommendations for follow-up in 1 month. Currently requiring 6 L of oxygen by nasal cannula to maintain O2 saturation greater than 90% Obtain ABG Obtain procalcitonin Maintain O2 saturation greater than 92%  Acute on chronic hypervolemic hyponatremia Prior sodium 132 Suspect SIADH Presented with sodium of 121 Continue with diuretics Continue to monitor urine output and fluid status  AKI Baseline creatinine appears to be 0.67 with GFR greater than 60 Presented with creatinine of 1.07 with GFR 52 Avoid nephrotoxic agents/dehydration/hypotension Repeat BMP in the morning Monitor urine output  Bilateral pulmonary nodules with suspicion for primary bronchogenic carcinoma Obtain left thoracentesis and sent body fluid for analysis  Bilateral pleural effusion greater on the left 2D echo was unrevealing Reported normal LVEF 60 to 65% with no regional wall motion abnormalities Obtain left thoracentesis and send body fluid for analysis  Hypotension Hold off antihypertensive medications Rule out active infective process Maintain map greater than 65  Severe protein calorie malnutrition Albumin 2.7 and BMI of 18  Hypothyroidism Continue levothyroxine  Iron deficiency anemia/symptomatic anemia Started ferrous sulfate 325 mg twice daily  Hyperlipidemia Continue Crestor  History of peripheral arterial disease Continue aspirin, Plavix and statin  History of breast cancer diagnosed 2 years ago On tamoxifen    DVT prophylaxis:  Subcu Lovenox daily. Code Status: Full code. Family Communication: Discussed with patient. Disposition Plan: Home. Consults called: None.    Objective: Vitals:   07/07/18 0529 07/07/18 0536 07/07/18 0847 07/07/18 1241  BP: (!) 63/40 (!) 70/50  (!) 79/56  Pulse: 76 80  71  Resp: 18   18  Temp: 98.2 F (36.8 C)  98.2 F (36.8 C)  TempSrc: Oral   Oral  SpO2: 92%   (!) 89% 98%  Weight:      Height:        Intake/Output Summary (Last 24 hours) at 07/07/2018 1419 Last data filed at 07/07/2018 1010 Gross per 24 hour  Intake 480 ml  Output 850 ml  Net -370 ml   Filed Weights   07/06/18 0058 07/06/18 0331 07/07/18 0300  Weight: 39 kg 44 kg 43.9 kg    Exam:  . General: 72 y.o. year-old female frail appearing.  Appears uncomfortable due to dyspnea.  Alert and oriented x3. . Cardiovascular: Regular rate and rhythm with no rubs or gallops.  No thyromegaly or JVD noted.   Marland Kitchen Respiratory: Mild rales at bases.  No wheezes noted. Good inspiratory effort. . Abdomen: Soft nontender nondistended with normal bowel sounds x4 quadrants. . Musculoskeletal: No lower extremity edema. 2/4 pulses in all 4 extremities. . Skin: No ulcerative lesions noted or rashes, . Psychiatry: Mood is appropriate for condition and setting   Data Reviewed: CBC: Recent Labs  Lab 07/06/18 0058 07/07/18 0457  WBC 8.0 8.1  NEUTROABS 6.3  --   HGB 7.9* 7.6*  HCT 25.0* 24.3*  MCV 88.3 89.3  PLT 312 170   Basic Metabolic Panel: Recent Labs  Lab 07/06/18 0058 07/06/18 0948 07/06/18 1425 07/06/18 1643  NA 121* 131* 132* 134*  K 4.0 3.3* 4.2 4.2  CL 87* 93* 94* 98  CO2 22 27 27 27   GLUCOSE 115* 112* 89 134*  BUN 7* 5* 7* 9  CREATININE 0.56 0.83 0.67 1.07*  CALCIUM 8.3* 8.5* 8.5* 8.4*   GFR: Estimated Creatinine Clearance: 32.9 mL/min (A) (by C-G formula based on SCr of 1.07 mg/dL (H)). Liver Function Tests: Recent Labs  Lab 07/06/18 0644  AST 17  ALT 14  ALKPHOS 51  BILITOT 0.4  PROT 5.4*  ALBUMIN 2.7*   No results for input(s): LIPASE, AMYLASE in the last 168 hours. No results for input(s): AMMONIA in the last 168 hours. Coagulation Profile: No results for input(s): INR, PROTIME in the last 168 hours. Cardiac Enzymes: Recent Labs  Lab 07/06/18 0644 07/06/18 1425 07/06/18 1643  TROPONINI <0.03 <0.03 <0.03   BNP (last 3 results) No results for  input(s): PROBNP in the last 8760 hours. HbA1C: No results for input(s): HGBA1C in the last 72 hours. CBG: No results for input(s): GLUCAP in the last 168 hours. Lipid Profile: No results for input(s): CHOL, HDL, LDLCALC, TRIG, CHOLHDL, LDLDIRECT in the last 72 hours. Thyroid Function Tests: Recent Labs    07/06/18 0215  TSH 4.424   Anemia Panel: Recent Labs    07/06/18 0215  VITAMINB12 890  FOLATE 30.5  FERRITIN 28  TIBC 312  IRON 20*  RETICCTPCT 3.8*   Urine analysis:    Component Value Date/Time   COLORURINE YELLOW 03/27/2012 1119   APPEARANCEUR CLEAR 03/27/2012 1119   LABSPEC 1.024 03/27/2012 1119   PHURINE 5.5 03/27/2012 1119   [place] 03/27/2012 1119   HGBUR NEGATIVE 03/27/2012 1119   BILIRUBINUR NEGATIVE 03/27/2012 1119   KETONESUR 15 (A) 03/27/2012 1119   PROTEINUR NEGATIVE 03/27/2012 1119   UROBILINOGEN 1.0 03/27/2012 1119   NITRITE NEGATIVE 03/27/2012 1119   LEUKOCYTESUR MODERATE (A) 03/27/2012 1119   Sepsis Labs: @LABRCNTIP (procalcitonin:4,lacticidven:4)  )No results found for this or any previous visit (from the past 240 hour(s)).    Studies: No results found.  Scheduled Meds: . aspirin  81 mg Oral  Daily  . calcium citrate  1,200 mg of elemental calcium Oral Daily  . clopidogrel  75 mg Oral Daily  . doxazosin  8 mg Oral Daily  . enoxaparin (LOVENOX) injection  40 mg Subcutaneous Q24H  . feeding supplement (ENSURE ENLIVE)  237 mL Oral BID BM  . ferrous sulfate  325 mg Oral BID WC  . furosemide  20 mg Intravenous Q12H  . levothyroxine  88 mcg Oral Q24H  . multivitamin with minerals  1 tablet Oral Daily  . rosuvastatin  10 mg Oral Daily  . tamoxifen  20 mg Oral Daily  . umeclidinium-vilanterol  1 puff Inhalation Daily    Continuous Infusions:   LOS: 1 day     Kayleen Memos, MD [facility] Pager [phone]  If 7PM-7AM, please contact night-coverage [email or web address] Password [email or web address] 07/07/2018, 2:19 PM

## 2018-07-08 ENCOUNTER — Encounter (HOSPITAL_COMMUNITY): Payer: Self-pay | Admitting: Physician Assistant

## 2018-07-08 ENCOUNTER — Inpatient Hospital Stay (HOSPITAL_COMMUNITY): Payer: Medicare Other

## 2018-07-08 HISTORY — PX: IR THORACENTESIS ASP PLEURAL SPACE W/IMG GUIDE: IMG5380

## 2018-07-08 LAB — LACTATE DEHYDROGENASE, PLEURAL OR PERITONEAL FLUID: LD, Fluid: 49 U/L — ABNORMAL HIGH (ref 3–23)

## 2018-07-08 LAB — ALBUMIN, PLEURAL OR PERITONEAL FLUID: Albumin, Fluid: 1 g/dL

## 2018-07-08 LAB — CBC WITH DIFFERENTIAL/PLATELET
Abs Immature Granulocytes: 0.03 10*3/uL (ref 0.00–0.07)
Basophils Absolute: 0 10*3/uL (ref 0.0–0.1)
Basophils Relative: 1 %
Eosinophils Absolute: 0.3 10*3/uL (ref 0.0–0.5)
Eosinophils Relative: 4 %
HCT: 21 % — ABNORMAL LOW (ref 36.0–46.0)
Hemoglobin: 6.5 g/dL — CL (ref 12.0–15.0)
Immature Granulocytes: 1 %
Lymphocytes Relative: 17 %
Lymphs Abs: 1.1 10*3/uL (ref 0.7–4.0)
MCH: 28 pg (ref 26.0–34.0)
MCHC: 31 g/dL (ref 30.0–36.0)
MCV: 90.5 fL (ref 80.0–100.0)
Monocytes Absolute: 0.6 10*3/uL (ref 0.1–1.0)
Monocytes Relative: 10 %
Neutro Abs: 4.4 10*3/uL (ref 1.7–7.7)
Neutrophils Relative %: 67 %
Platelets: 269 10*3/uL (ref 150–400)
RBC: 2.32 MIL/uL — ABNORMAL LOW (ref 3.87–5.11)
RDW: 16.8 % — ABNORMAL HIGH (ref 11.5–15.5)
WBC: 6.5 10*3/uL (ref 4.0–10.5)
nRBC: 0 % (ref 0.0–0.2)

## 2018-07-08 LAB — BASIC METABOLIC PANEL
Anion gap: 8 (ref 5–15)
BUN: 12 mg/dL (ref 8–23)
CO2: 27 mmol/L (ref 22–32)
Calcium: 8.5 mg/dL — ABNORMAL LOW (ref 8.9–10.3)
Chloride: 98 mmol/L (ref 98–111)
Creatinine, Ser: 0.89 mg/dL (ref 0.44–1.00)
GFR calc Af Amer: >60 mL/min (ref >60)
GFR calc non Af Amer: >60 mL/min (ref >60)
Glucose, Bld: 87 mg/dL (ref 70–99)
Potassium: 3.9 mmol/L (ref 3.5–5.1)
Sodium: 133 mmol/L — ABNORMAL LOW (ref 135–145)

## 2018-07-08 LAB — GRAM STAIN

## 2018-07-08 LAB — GLUCOSE, PLEURAL OR PERITONEAL FLUID: Glucose, Fluid: 203 mg/dL

## 2018-07-08 LAB — BODY FLUID CELL COUNT WITH DIFFERENTIAL
Eos, Fluid: 0 %
Lymphs, Fluid: 9 %
Monocyte-Macrophage-Serous Fluid: 53 % (ref 50–90)
Neutrophil Count, Fluid: 38 % — ABNORMAL HIGH (ref 0–25)
Total Nucleated Cell Count, Fluid: 171 cu mm (ref 0–1000)

## 2018-07-08 LAB — PREPARE RBC (CROSSMATCH)

## 2018-07-08 MED ORDER — SODIUM CHLORIDE 3 % IN NEBU
4.0000 mL | INHALATION_SOLUTION | Freq: Three times a day (TID) | RESPIRATORY_TRACT | Status: DC
  Administered 2018-07-08 – 2018-07-11 (×8): 4 mL via RESPIRATORY_TRACT
  Filled 2018-07-08 (×10): qty 4

## 2018-07-08 MED ORDER — SODIUM CHLORIDE 3 % IN NEBU
4.0000 mL | INHALATION_SOLUTION | Freq: Three times a day (TID) | RESPIRATORY_TRACT | Status: DC
  Administered 2018-07-08: 4 mL via RESPIRATORY_TRACT
  Filled 2018-07-08 (×2): qty 4

## 2018-07-08 MED ORDER — IPRATROPIUM-ALBUTEROL 0.5-2.5 (3) MG/3ML IN SOLN
3.0000 mL | Freq: Four times a day (QID) | RESPIRATORY_TRACT | Status: DC
  Administered 2018-07-08 – 2018-07-13 (×19): 3 mL via RESPIRATORY_TRACT
  Filled 2018-07-08 (×20): qty 3

## 2018-07-08 MED ORDER — LIDOCAINE HCL 1 % IJ SOLN
INTRAMUSCULAR | Status: AC
  Filled 2018-07-08: qty 20

## 2018-07-08 MED ORDER — LIDOCAINE HCL 1 % IJ SOLN
INTRAMUSCULAR | Status: DC | PRN
  Administered 2018-07-08: 10 mL

## 2018-07-08 MED ORDER — PANTOPRAZOLE SODIUM 40 MG PO TBEC
40.0000 mg | DELAYED_RELEASE_TABLET | Freq: Every day | ORAL | Status: DC
  Administered 2018-07-08 – 2018-07-13 (×5): 40 mg via ORAL
  Filled 2018-07-08 (×6): qty 1

## 2018-07-08 MED ORDER — ALBUTEROL SULFATE (2.5 MG/3ML) 0.083% IN NEBU
2.5000 mg | INHALATION_SOLUTION | Freq: Four times a day (QID) | RESPIRATORY_TRACT | Status: DC | PRN
  Administered 2018-07-08: 2.5 mg via RESPIRATORY_TRACT
  Filled 2018-07-08: qty 3

## 2018-07-08 MED ORDER — SODIUM CHLORIDE 0.9% IV SOLUTION
Freq: Once | INTRAVENOUS | Status: AC
  Administered 2018-07-08: 13:00:00 via INTRAVENOUS

## 2018-07-08 MED ORDER — ENSURE ENLIVE PO LIQD
237.0000 mL | Freq: Every day | ORAL | Status: DC
  Administered 2018-07-09 – 2018-07-12 (×2): 237 mL via ORAL

## 2018-07-08 MED ORDER — DM-GUAIFENESIN ER 30-600 MG PO TB12
2.0000 | ORAL_TABLET | Freq: Two times a day (BID) | ORAL | Status: DC
  Administered 2018-07-08 – 2018-07-11 (×6): 2 via ORAL
  Filled 2018-07-08: qty 2
  Filled 2018-07-08 (×2): qty 1
  Filled 2018-07-08 (×4): qty 2

## 2018-07-08 MED ORDER — METHYLPREDNISOLONE SODIUM SUCC 125 MG IJ SOLR
60.0000 mg | Freq: Three times a day (TID) | INTRAMUSCULAR | Status: DC
  Administered 2018-07-08 – 2018-07-10 (×6): 60 mg via INTRAVENOUS
  Filled 2018-07-08 (×7): qty 2

## 2018-07-08 MED ORDER — SALINE SPRAY 0.65 % NA SOLN
1.0000 | NASAL | Status: DC | PRN
  Administered 2018-07-08: 1 via NASAL
  Filled 2018-07-08: qty 44

## 2018-07-08 NOTE — Progress Notes (Signed)
CRITICAL VALUE ALERT  Critical Value:  Hgb 6.5  Date & Time Notied: 07/08/18 at 0722  Provider Notified: Dr. Nevada Crane via Amion  Orders Received/Actions taken: Waiting for MD response

## 2018-07-08 NOTE — Progress Notes (Signed)
PROGRESS NOTE  CHERON CORYELL QZR:[id number] DOB: 19-Sep-1945 DOA: 07/06/2018 PCP: Merrilee Seashore, MD  HPI/Recap of past 24 hours: Connye Burkitt Mahalikis a 72 y.o.femalewithhistory of COPD, hypothyroidism, hyperlipidemia, hypertension has been experiencing shortness of breath for the last 2 weeks. Shortness of breath is mostly exertional. And also noticed bilateral lower extremity edema. Denies any chest pain. Had gone to her PCP and was found to have low sodium and also hemoglobin which has been worked up. Since patient shortness of breath getting worse patient came to the ER. Patient not noticed any blood in the stools.  Admitted for acute hypoxic respiratory failure and hypervolemic hyponatremia.  Hospital course complicated by persistent dyspnea at rest.  CT angio PE ordered due to elevated d-dimer 2.86.  This revealed no pulmonary embolism however there were incidental findings of irregular 2.7 cm nodular focus of consolidation in the posterior left upper lobe with surrounding patchy groundglass opacity and spiculated 1.8 cm solid apical right upper lobe pulmonary nodule with morphologic suspicious for primary bronchogenic carcinoma Recommendations for follow-up in 1 month  Admits to current tobacco use more than 1/2 pack/day for at least 50 years.   07/07/18: Patient seen and examined at her bedside.  Report dyspnea at rest.  Requiring 6 L of oxygen by nasal cannula.  Not previously on supplemental oxygen.  07/08/2018: Patient seen and examined at his bedside.  Hypotensive overnight.  Was given IV albumin with improvement of her blood pressure.  Hemoglobin dropped this morning to 6.8.  Negative FOBT.  2 units PRBCs ordered to be transfused.  Assessment/Plan: Principal Problem:   Acute respiratory failure with hypoxia (HCC) Active Problems:   Peripheral arterial disease (HCC)   Essential hypertension   Breast cancer of upper-outer quadrant of left female breast ([place])  Acute CHF (congestive heart failure) (HCC)   Hyponatremia   Anemia   COPD with acute bronchitis (HCC)  Acute hypoxic tori failure secondary to bilateral pleural effusions versus COPD exacerbation versus others Independently reviewed CTA PE which was negative for PE but with pulmonary nodules on left and right lungs.  Recommendations for follow-up in 1 month. Currently requiring 6 L of oxygen by nasal cannula to maintain O2 saturation greater than 90% ABG done on 07/07/2018 independently reviewed with hypoxia Negative procalcitonin Maintain O2 saturation greater than 92%  Acute COPD exacerbation Start IV Solu-Medrol 60 mg 3 times daily Start duo nebs every 6 hours Start pulmonary toilet with Mucinex 1200 mg twice daily and hypersaline nebs 3 times daily Continue COPD medications Add Z-Pak x5 days for its anti-inflammatory properties  Symptomatic anemia/iron deficiency anemia Hemoglobin dropped from 7.6-6.5 2 unit PRBCs to be transfused today Repeat CBC in the morning No sign of overt bleeding FOBT negative Continue ferrous sulfate 325 twice daily  Acute on chronic hypervolemic hyponatremia Prior sodium 132 Improving Suspect SIADH Presented with sodium of 121 Continue with diuretics Continue to monitor urine output and fluid status  Resolving AKI Baseline creatinine appears to be 0.67 with GFR greater than 60 Presented with creatinine of 1.07 with GFR 52 Today creatinine is 0.89 with GFR greater than 60 Avoid nephrotoxic agents/dehydration/hypotension Repeat BMP in the morning Monitor urine output  Bilateral pulmonary nodules with suspicion for primary bronchogenic carcinoma Obtain left thoracentesis and sent body fluid for analysis  Bilateral pleural effusion greater on the left 2D echo was unrevealing Reported normal LVEF 60 to 65% with no regional wall motion abnormalities Obtain left thoracentesis and send body fluid for analysis  Hypotension  Hold off all  antihypertensive medications Rule out active infective process Maintain map greater than 65  Severe protein calorie malnutrition Albumin 2.7 and BMI of 18  Hypothyroidism Continue levothyroxine  Iron deficiency anemia/symptomatic anemia Started ferrous sulfate 325 mg twice daily  Hyperlipidemia Continue Crestor  History of peripheral arterial disease Continue aspirin, Plavix and statin  History of breast cancer diagnosed 2 years ago On tamoxifen Curbside with Dr. Marye Round her oncologist    DVT prophylaxis:  Subcu Lovenox daily. Code Status: Full code. Family Communication: Discussed with patient. Disposition Plan: Home. Consults called:  None    Objective: Vitals:   07/08/18 1045 07/08/18 1333 07/08/18 1453 07/08/18 1500  BP: (!) 88/58 (!) 88/58 92/63   Pulse: 82 84 84   Resp: 16  18   Temp: 98.4 F (36.9 C) 98.2 F (36.8 C) 98 F (36.7 C)   TempSrc: Oral Oral Oral   SpO2:  94% 97% 94%  Weight:      Height:        Intake/Output Summary (Last 24 hours) at 07/08/2018 1521 Last data filed at 07/08/2018 1300 Gross per 24 hour  Intake 1050 ml  Output 851 ml  Net 199 ml   Filed Weights   07/06/18 0331 07/07/18 0300 07/08/18 0128  Weight: 44 kg 43.9 kg 45.2 kg    Exam:  . General: 72 y.o. year-old female frail.  Appears uncomfortable due to dyspnea at rest.  Alert and oriented x3. . Cardiovascular: Regular rate and rhythm with no rubs or gallops.  No JVD or thyromegaly noted.   Marland Kitchen Respiratory: Mild rales at bases.  Diffuse wheezes noted bilaterally.  Poor inspiratory effort. . Abdomen: Soft nontender nondistended with normal bowel sounds x4 quadrants. . Musculoskeletal: No lower extremity edema. 2/4 pulses in all 4 extremities. . Skin: No ulcerative lesions noted or rashes . Psychiatry: Mood is appropriate for condition and setting   Data Reviewed: CBC: Recent Labs  Lab 07/06/18 0058 07/07/18 0457 07/08/18 0449  WBC 8.0 8.1 6.5  NEUTROABS 6.3  --   4.4  HGB 7.9* 7.6* 6.5*  HCT 25.0* 24.3* 21.0*  MCV 88.3 89.3 90.5  PLT 312 303 003   Basic Metabolic Panel: Recent Labs  Lab 07/06/18 0058 07/06/18 0948 07/06/18 1425 07/06/18 1643 07/08/18 0449  NA 121* 131* 132* 134* 133*  K 4.0 3.3* 4.2 4.2 3.9  CL 87* 93* 94* 98 98  CO2 22 27 27 27 27   GLUCOSE 115* 112* 89 134* 87  BUN 7* 5* 7* 9 12  CREATININE 0.56 0.83 0.67 1.07* 0.89  CALCIUM 8.3* 8.5* 8.5* 8.4* 8.5*   GFR: Estimated Creatinine Clearance: 40.8 mL/min (by C-G formula based on SCr of 0.89 mg/dL). Liver Function Tests: Recent Labs  Lab 07/06/18 0644  AST 17  ALT 14  ALKPHOS 51  BILITOT 0.4  PROT 5.4*  ALBUMIN 2.7*   No results for input(s): LIPASE, AMYLASE in the last 168 hours. No results for input(s): AMMONIA in the last 168 hours. Coagulation Profile: No results for input(s): INR, PROTIME in the last 168 hours. Cardiac Enzymes: Recent Labs  Lab 07/06/18 0644 07/06/18 1425 07/06/18 1643  TROPONINI <0.03 <0.03 <0.03   BNP (last 3 results) No results for input(s): PROBNP in the last 8760 hours. HbA1C: No results for input(s): HGBA1C in the last 72 hours. CBG: No results for input(s): GLUCAP in the last 168 hours. Lipid Profile: No results for input(s): CHOL, HDL, LDLCALC, TRIG, CHOLHDL, LDLDIRECT in the last 72  hours. Thyroid Function Tests: Recent Labs    07/06/18 0215  TSH 4.424   Anemia Panel: Recent Labs    07/06/18 0215  VITAMINB12 890  FOLATE 30.5  FERRITIN 28  TIBC 312  IRON 20*  RETICCTPCT 3.8*   Urine analysis:    Component Value Date/Time   COLORURINE YELLOW 03/27/2012 1119   APPEARANCEUR CLEAR 03/27/2012 1119   LABSPEC 1.024 03/27/2012 1119   PHURINE 5.5 03/27/2012 1119   [place] 03/27/2012 1119   HGBUR NEGATIVE 03/27/2012 1119   BILIRUBINUR NEGATIVE 03/27/2012 1119   KETONESUR 15 (A) 03/27/2012 1119   PROTEINUR NEGATIVE 03/27/2012 1119   UROBILINOGEN 1.0 03/27/2012 1119   NITRITE NEGATIVE 03/27/2012 1119    LEUKOCYTESUR MODERATE (A) 03/27/2012 1119   Sepsis Labs: @LABRCNTIP (procalcitonin:4,lacticidven:4)  )No results found for this or any previous visit (from the past 240 hour(s)).    Studies: No results found.  Scheduled Meds: . aspirin  81 mg Oral Daily  . calcium citrate  1,200 mg of elemental calcium Oral Daily  . clopidogrel  75 mg Oral Daily  . dextromethorphan-guaiFENesin  2 tablet Oral BID  . enoxaparin (LOVENOX) injection  40 mg Subcutaneous Q24H  . [START ON 07/09/2018] feeding supplement (ENSURE ENLIVE)  237 mL Oral QHS  . ferrous sulfate  325 mg Oral BID WC  . ipratropium-albuterol  3 mL Nebulization Q6H  . levothyroxine  88 mcg Oral Q24H  . lidocaine      . methylPREDNISolone (SOLU-MEDROL) injection  60 mg Intravenous TID  . multivitamin with minerals  1 tablet Oral Daily  . pantoprazole  40 mg Oral Daily  . rosuvastatin  10 mg Oral Daily  . sodium chloride HYPERTONIC  4 mL Nebulization TID  . tamoxifen  20 mg Oral Daily  . umeclidinium-vilanterol  1 puff Inhalation Daily    Continuous Infusions:   LOS: 2 days     Kayleen Memos, MD [facility] Pager [phone]  If 7PM-7AM, please contact night-coverage [email or web address] Password [email or web address] 07/08/2018, 3:21 PM

## 2018-07-08 NOTE — Consult Note (Signed)
   [facility] Inpatient Consult   07/08/2018  Kendra Watts [birth year] [id number]  Patient was evaluated for [facility] services for extreme high risk for unplanned readmission score.  Came by to see the patient and she was receiving nursing care nurse states she just back from a procedure. Will follow for progression and needs at a more appropriate time.  Patient with Medicare/Next Generation.  For questions, please contact:  Natividad Brood, RN BSN [facility] Liaison  [phone] business mobile phone Toll free office [phone]

## 2018-07-08 NOTE — Progress Notes (Signed)
Responded to consult to check established line. RN in room and states PIV obtained and no access needed at this time. Pt denies having PAC and no other CVAD found on assessment.

## 2018-07-08 NOTE — Procedures (Signed)
PROCEDURE SUMMARY:  Successful US guided left thoracentesis. Yielded 550 mL of clear yellow fluid. Patient tolerated procedure well. No immediate complications. EBL = trace  Specimen was sent for labs.  Post procedure chest X-ray pending.  Stanford Strauch S Deiondre Harrower PA-C 07/08/2018 4:16 PM

## 2018-07-08 NOTE — Progress Notes (Signed)
Nutrition Follow-up  DOCUMENTATION CODES:   Not applicable  INTERVENTION:   -Decrease Ensure Enlive po to daily, each supplement provides 350 kcal and 20 grams of protein -Continue MVI with minerals daily  NUTRITION DIAGNOSIS:   Increased nutrient needs related to chronic illness(COPD) as evidenced by estimated needs.  Ongoing  GOAL:   Patient will meet greater than or equal to 90% of their needs  Progressing  MONITOR:   PO intake, Supplement acceptance, Labs, Weight trends, Skin, I & O's  REASON FOR ASSESSMENT:   Malnutrition Screening Tool    ASSESSMENT:   Kendra Watts is a 72 y.o. female with history of COPD, hypothyroidism, hyperlipidemia, hypertension has been experiencing shortness of breath for the last 2 weeks.  Shortness of breath is mostly exertional.  And also noticed bilateral lower extremity edema.  Denies any chest pain.  Had gone to her PCP and was found to have low sodium and also hemoglobin which has been worked up.  Since patient shortness of breath getting worse patient came to the ER.  Patient not noticed any blood in the stools.  Spoke with pt at bedside, who is in good spirits today (doing a sodoku puzzle while receiving blood transfusion). Pt appears physically better than recollection from last RD visit. Pt reports improved appetite and has been consuming almost all of her food off meal trays. Noted meal completion 80-100% (pt consumed blueberry muffin and toast for breakfast and meatloaf, mashed potatoes, and green beans last night).   Pt reports not receiving any Ensure supplements since hospitalization (however, received one earlier this morning per [facility]). RD will decrease frequency secondary to fluid restriction and improved oral intake).   Medications reviewed and include solu-medrol.   Labs reviewed: Na: 131.   Diet Order:   Diet Order            Diet Heart Room service appropriate? Yes; Fluid consistency: Thin; Fluid restriction: 1200 mL  Fluid  Diet effective now              EDUCATION NEEDS:   Education needs have been addressed  Skin:  Skin Assessment: Skin Integrity Issues: Skin Integrity Issues:: Incisions Incisions: closed lt breast incision  Last BM:  07/05/18  Height:   Ht Readings from Last 1 Encounters:  07/06/18 5' (1.524 m)    Weight:   Wt Readings from Last 1 Encounters:  07/08/18 45.2 kg    Ideal Body Weight:  45.5 kg  BMI:  Body mass index is 19.47 kg/m.  Estimated Nutritional Needs:   Kcal:  1400-1600  Protein:  60-75 grams  Fluid:  1.4-1.6 L    Kiaan Overholser A. Jimmye Norman, RD, LDN, CDE Pager: [phone] After hours Pager: [phone]

## 2018-07-08 NOTE — Plan of Care (Signed)
Pt is alert and oriented. Low BP, SBP in the 80's. Pt denies pain, dizziness, or lightheadedness. Getting albumin at this time. Will continue monitoring

## 2018-07-09 LAB — TYPE AND SCREEN
ABO/RH(D): O NEG
Antibody Screen: NEGATIVE
Unit division: 0
Unit division: 0

## 2018-07-09 LAB — CBC
HCT: 32.5 % — ABNORMAL LOW (ref 36.0–46.0)
Hemoglobin: 10.9 g/dL — ABNORMAL LOW (ref 12.0–15.0)
MCH: 29.9 pg (ref 26.0–34.0)
MCHC: 33.5 g/dL (ref 30.0–36.0)
MCV: 89.3 fL (ref 80.0–100.0)
Platelets: 287 10*3/uL (ref 150–400)
RBC: 3.64 MIL/uL — ABNORMAL LOW (ref 3.87–5.11)
RDW: 15.9 % — ABNORMAL HIGH (ref 11.5–15.5)
WBC: 8.2 10*3/uL (ref 4.0–10.5)
nRBC: 0 % (ref 0.0–0.2)

## 2018-07-09 LAB — BASIC METABOLIC PANEL
Anion gap: 13 (ref 5–15)
BUN: 12 mg/dL (ref 8–23)
CO2: 21 mmol/L — ABNORMAL LOW (ref 22–32)
Calcium: 8.8 mg/dL — ABNORMAL LOW (ref 8.9–10.3)
Chloride: 100 mmol/L (ref 98–111)
Creatinine, Ser: 0.76 mg/dL (ref 0.44–1.00)
GFR calc Af Amer: >60 mL/min (ref >60)
GFR calc non Af Amer: >60 mL/min (ref >60)
Glucose, Bld: 126 mg/dL — ABNORMAL HIGH (ref 70–99)
Potassium: 4.3 mmol/L (ref 3.5–5.1)
Sodium: 134 mmol/L — ABNORMAL LOW (ref 135–145)

## 2018-07-09 LAB — BPAM RBC
Blood Product Expiration Date: 201912212359
Blood Product Expiration Date: 201912302359
ISSUE DATE / TIME: 201912181038
ISSUE DATE / TIME: 201912182015
Unit Type and Rh: 9500
Unit Type and Rh: 9500

## 2018-07-09 LAB — PH, BODY FLUID: pH, Body Fluid: 7.4

## 2018-07-09 MED ORDER — AZITHROMYCIN 250 MG PO TABS
250.0000 mg | ORAL_TABLET | Freq: Every day | ORAL | Status: AC
  Administered 2018-07-10 – 2018-07-13 (×4): 250 mg via ORAL
  Filled 2018-07-09 (×4): qty 1

## 2018-07-09 MED ORDER — AZITHROMYCIN 500 MG PO TABS
500.0000 mg | ORAL_TABLET | Freq: Every day | ORAL | Status: AC
  Administered 2018-07-09: 500 mg via ORAL
  Filled 2018-07-09: qty 1

## 2018-07-09 MED ORDER — FUROSEMIDE 10 MG/ML IJ SOLN
40.0000 mg | Freq: Every day | INTRAMUSCULAR | Status: DC
  Administered 2018-07-09 – 2018-07-13 (×5): 40 mg via INTRAVENOUS
  Filled 2018-07-09 (×5): qty 4

## 2018-07-09 NOTE — Plan of Care (Signed)
  Problem: Health Behavior/Discharge Planning: Goal: Ability to manage health-related needs will improve Outcome: Progressing   Problem: Clinical Measurements: Goal: Ability to maintain clinical measurements within normal limits will improve Outcome: Progressing   Problem: Activity: Goal: Risk for activity intolerance will decrease Outcome: Progressing   Problem: Nutrition: Goal: Adequate nutrition will be maintained Outcome: Progressing   Problem: Coping: Goal: Level of anxiety will decrease Outcome: Progressing   Problem: Safety: Goal: Ability to remain free from injury will improve Outcome: Progressing   Problem: Education: Goal: Ability to demonstrate management of disease process will improve Outcome: Progressing

## 2018-07-09 NOTE — Progress Notes (Signed)
PROGRESS NOTE  Kendra Watts GYJ:[id number] DOB: [birth year] DOA: 07/06/2018 PCP: Merrilee Seashore, MD  HPI/Recap of past 24 hours: Kendra Burkitt Mahalikis a 72 y.o.femalewithhistory of COPD, hypothyroidism, hyperlipidemia, hypertension has been experiencing shortness of breath for the last 2 weeks. Shortness of breath is mostly exertional. And also noticed bilateral lower extremity edema. Denies any chest pain. Had gone to her PCP and was found to have low sodium and also hemoglobin which has been worked up. Since patient shortness of breath getting worse patient came to the ER. Patient not noticed any blood in the stools.  Admitted for acute hypoxic respiratory failure and hypervolemic hyponatremia.  Hospital course complicated by persistent dyspnea at rest.  CT angio PE ordered due to elevated d-dimer 2.86.  This revealed no pulmonary embolism however there were incidental findings of irregular 2.7 cm nodular focus of consolidation in the posterior left upper lobe with surrounding patchy groundglass opacity and spiculated 1.8 cm solid apical right upper lobe pulmonary nodule with morphologic suspicious for primary bronchogenic carcinoma Recommendations for follow-up in 1 month  Admits to current tobacco use more than 1/2 pack/day for at least 50 years.   07/07/18: Patient seen and examined at her bedside.  Report dyspnea at rest.  Requiring 6 L of oxygen by nasal cannula.  Not previously on supplemental oxygen.  07/08/2018: Patient seen and examined at his bedside.  Hypotensive overnight.  Was given IV albumin with improvement of her blood pressure.  Hemoglobin dropped this morning to 6.8.  Negative FOBT.  2 units PRBCs ordered to be transfused.  07/09/2018: Patient seen and examined at her bedside.  Had left thoracentesis yesterday with 550 cc fluid removed.  Awaiting results of pathology.  Reports dyspnea with minimal movement.  Restarted IV Lasix with improvement of her blood  pressure.  Assessment/Plan: Principal Problem:   Acute respiratory failure with hypoxia (HCC) Active Problems:   Peripheral arterial disease (HCC)   Essential hypertension   Breast cancer of upper-outer quadrant of left female breast ([place])   Acute CHF (congestive heart failure) (HCC)   Hyponatremia   Anemia   COPD with acute bronchitis (HCC)  Acute hypoxic respiratory failure secondary to bilateral pleural effusions versus COPD exacerbation versus others Independently reviewed CTA PE which was negative for PE but with pulmonary nodules on left and right lungs.  Recommendations for follow-up in 1 month. Currently requiring 6 L of oxygen by nasal cannula to maintain O2 saturation greater than 90% ABG done on 07/07/2018 independently reviewed with hypoxia Negative procalcitonin Maintain O2 saturation greater than 92% Start IV Lasix 40 mg daily  Acute COPD exacerbation Continue IV Solu-Medrol 60 mg 3 times daily Continue duo nebs every 6 hours Continue pulmonary toilet with Mucinex 1200 mg twice daily and hypersaline nebs 3 times daily Continue COPD medications Add Z-Pak x5 days for its anti-inflammatory properties  Symptomatic anemia/iron deficiency anemia Post 2 unit PRBC transfusion Hemoglobin from 6.5 to 10.9 today Repeat CBC in the morning No sign of overt bleeding FOBT negative Continue ferrous sulfate 325 twice daily  Acute on chronic hypervolemic hyponatremia Improving sodium 134 Suspect SIADH Presented with sodium of 121 Continue with diuretics IV Lasix 40 mg daily Continue to monitor urine output and fluid status  Resolving AKI Baseline creatinine appears to be 0.67 with GFR greater than 60 Today creatinine is 0.76 from 0.89 with GFR greater than 60 Avoid nephrotoxic agents/dehydration/hypotension Repeat BMP in the morning Monitor urine output  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 Obtain left thoracentesis and sent  body fluid for analysis  Bilateral pleural effusion greater on the left 2D echo was unrevealing Reported normal LVEF 60 to 65% with no regional wall motion abnormalities Obtain left thoracentesis and send body fluid for analysis  Hypotension Hold off all antihypertensive medications Rule out active infective process Maintain map greater than 65  Severe protein calorie malnutrition Albumin 2.7 and BMI of 18  Hypothyroidism Continue levothyroxine  Iron deficiency anemia/symptomatic anemia Started ferrous sulfate 325 mg twice daily  Hyperlipidemia Continue Crestor  History of peripheral arterial disease Continue aspirin, Plavix and statin  History of breast cancer diagnosed 2 years ago On tamoxifen Curbside with Dr. Marye Round her oncologist    DVT prophylaxis:  Subcu Lovenox daily. Code Status: Full code. Family Communication: Discussed with patient. Disposition Plan: Home. Consults called:  None    Objective: Vitals:   07/09/18 0515 07/09/18 0907 07/09/18 0909 07/09/18 1129  BP: 102/63   107/65  Pulse: 84   91  Resp: 18   19  Temp: 98.6 F (37 C)   98 F (36.7 C)  TempSrc: Oral   Oral  SpO2: 90% (!) 82% (!) 89% (!) 87%  Weight:      Height:        Intake/Output Summary (Last 24 hours) at 07/09/2018 1523 Last data filed at 07/09/2018 0944 Gross per 24 hour  Intake 1002 ml  Output 0 ml  Net 1002 ml   Filed Weights   07/07/18 0300 07/08/18 0128 07/09/18 0500  Weight: 43.9 kg 45.2 kg 47.6 kg    Exam:  . General: 72 y.o. year-old female frail.  Appears uncomfortable due to dyspnea at rest.  Alert and oriented x3.   . Cardiovascular: Regular rate and rhythm with no rubs or gallops.  No JVD or thyromegaly noted . Respiratory: Mild rales at bases.  Poor inspiratory effort. . Abdomen: Soft nontender nondistended with normal bowel sounds x4 quadrants. . Musculoskeletal: No lower extremity edema. 2/4 pulses in all 4 extremities. . Skin: No ulcerative  lesions noted or rashes . Psychiatry: Mood is appropriate for condition and setting   Data Reviewed: CBC: Recent Labs  Lab 07/06/18 0058 07/07/18 0457 07/08/18 0449 07/09/18 0344  WBC 8.0 8.1 6.5 8.2  NEUTROABS 6.3  --  4.4  --   HGB 7.9* 7.6* 6.5* 10.9*  HCT 25.0* 24.3* 21.0* 32.5*  MCV 88.3 89.3 90.5 89.3  PLT 312 303 269 283   Basic Metabolic Panel: Recent Labs  Lab 07/06/18 0948 07/06/18 1425 07/06/18 1643 07/08/18 0449 07/09/18 0344  NA 131* 132* 134* 133* 134*  K 3.3* 4.2 4.2 3.9 4.3  CL 93* 94* 98 98 100  CO2 27 27 27 27  21*  GLUCOSE 112* 89 134* 87 126*  BUN 5* 7* 9 12 12   CREATININE 0.83 0.67 1.07* 0.89 0.76  CALCIUM 8.5* 8.5* 8.4* 8.5* 8.8*   GFR: Estimated Creatinine Clearance: 45.7 mL/min (by C-G formula based on SCr of 0.76 mg/dL). Liver Function Tests: Recent Labs  Lab 07/06/18 0644  AST 17  ALT 14  ALKPHOS 51  BILITOT 0.4  PROT 5.4*  ALBUMIN 2.7*   No results for input(s): LIPASE, AMYLASE in the last 168 hours. No results for input(s): AMMONIA in the last 168 hours. Coagulation Profile: No results for input(s): INR, PROTIME in the last 168 hours. Cardiac Enzymes: Recent Labs  Lab 07/06/18 0644 07/06/18 1425 07/06/18 1643  TROPONINI <0.03 <0.03 <0.03   BNP (last 3  results) No results for input(s): PROBNP in the last 8760 hours. HbA1C: No results for input(s): HGBA1C in the last 72 hours. CBG: No results for input(s): GLUCAP in the last 168 hours. Lipid Profile: No results for input(s): CHOL, HDL, LDLCALC, TRIG, CHOLHDL, LDLDIRECT in the last 72 hours. Thyroid Function Tests: No results for input(s): TSH, T4TOTAL, FREET4, T3FREE, THYROIDAB in the last 72 hours. Anemia Panel: No results for input(s): VITAMINB12, FOLATE, FERRITIN, TIBC, IRON, RETICCTPCT in the last 72 hours. Urine analysis:    Component Value Date/Time   COLORURINE YELLOW 03/27/2012 1119   APPEARANCEUR CLEAR 03/27/2012 1119   LABSPEC 1.024 03/27/2012 1119    PHURINE 5.5 03/27/2012 1119   GLUCOSEU NEGATIVE 03/27/2012 1119   HGBUR NEGATIVE 03/27/2012 1119   BILIRUBINUR NEGATIVE 03/27/2012 1119   KETONESUR 15 (A) 03/27/2012 1119   PROTEINUR NEGATIVE 03/27/2012 1119   UROBILINOGEN 1.0 03/27/2012 1119   NITRITE NEGATIVE 03/27/2012 1119   LEUKOCYTESUR MODERATE (A) 03/27/2012 1119   Sepsis Labs: @LABRCNTIP (procalcitonin:4,lacticidven:4)  ) Recent Results (from the past 240 hour(s))  Gram stain     Status: None   Collection Time: 07/08/18  4:24 PM  Result Value Ref Range Status   Specimen Description PLEURAL L  Final   Special Requests NONE  Final   Gram Stain   Final    WBC PRESENT,BOTH PMN AND MONONUCLEAR NO ORGANISMS SEEN CYTOSPIN SMEAR Performed at [facility], [address], [place], [place] [address]    Report Status 07/08/2018 FINAL  Final      Studies: Dg Chest 1 View  Result Date: 07/08/2018 CLINICAL DATA:  Followup left thoracentesis. EXAM: CHEST  1 VIEW COMPARISON:  07/06/2018 FINDINGS: Mild cardiomegaly. Aortic atherosclerosis. Worsened interstitial density consistent with worsened edema. Less pleural fluid on the left following thoracentesis. No pneumothorax. Small bilateral effusions persist with basilar atelectasis. IMPRESSION: 1. Less pleural fluid on the left following thoracentesis. No pneumothorax. 2. Worsened interstitial edema. Persistent effusions and basilar atelectasis. Electronically Signed   By: [name] M.D.   On: 07/08/2018 16:28   Ir Thoracentesis Asp Pleural Space W/img Guide  Result Date: 07/08/2018 INDICATION: Bilateral pleural effusions. CT findings showed a posterior left upper lobe with surrounding patchy ground glass opacity and spiculated 1.8 cm solid apical right upper lobe pulmonary nodule with morphologic suspicious for primary bronchogenic carcinoma. Request for diagnostic and therapeutic thoracentesis. EXAM: ULTRASOUND GUIDED LEFT THORACENTESIS MEDICATIONS: 1% lidocaine 9 mL  COMPLICATIONS: None immediate. PROCEDURE: An ultrasound guided thoracentesis was thoroughly discussed with the patient and questions answered. The benefits, risks, alternatives and complications were also discussed. The patient understands and wishes to proceed with the procedure. Written consent was obtained. Ultrasound was performed to localize and mark an adequate pocket of fluid in the left chest. The area was then prepped and draped in the normal sterile fashion. 1% Lidocaine was used for local anesthesia. Under ultrasound guidance a 6 Fr Safe-T-Centesis catheter was introduced. Thoracentesis was performed. The catheter was removed and a dressing applied. FINDINGS: A total of approximately 550 mL of clear yellow fluid was removed. Samples were sent to the laboratory as requested by the clinical team. IMPRESSION: Successful ultrasound guided left thoracentesis yielding 550 mL of pleural fluid. Read by: Gareth Eagle, PA-C Electronically Signed   By: Jacqulynn Cadet M.D.   On: 07/08/2018 16:15    Scheduled Meds: . aspirin  81 mg Oral Daily  . calcium citrate  1,200 mg of elemental calcium Oral Daily  . clopidogrel  75  mg Oral Daily  . dextromethorphan-guaiFENesin  2 tablet Oral BID  . enoxaparin (LOVENOX) injection  40 mg Subcutaneous Q24H  . feeding supplement (ENSURE ENLIVE)  237 mL Oral QHS  . ferrous sulfate  325 mg Oral BID WC  . furosemide  40 mg Intravenous Daily  . ipratropium-albuterol  3 mL Nebulization Q6H  . levothyroxine  88 mcg Oral Q24H  . methylPREDNISolone (SOLU-MEDROL) injection  60 mg Intravenous TID  . multivitamin with minerals  1 tablet Oral Daily  . pantoprazole  40 mg Oral Daily  . rosuvastatin  10 mg Oral Daily  . sodium chloride HYPERTONIC  4 mL Nebulization TID  . tamoxifen  20 mg Oral Daily  . umeclidinium-vilanterol  1 puff Inhalation Daily    Continuous Infusions:   LOS: 3 days     Kayleen Memos, MD [facility] Pager [phone]  If 7PM-7AM,  please contact night-coverage [email or web address] Password [facility] 07/09/2018, 3:23 PM

## 2018-07-09 NOTE — Plan of Care (Signed)
  Problem: Clinical Measurements: Goal: Diagnostic test results will improve Outcome: Progressing   Problem: Clinical Measurements: Goal: Respiratory complications will improve Outcome: Progressing   Problem: Activity: Goal: Risk for activity intolerance will decrease Outcome: Progressing   

## 2018-07-09 NOTE — Progress Notes (Signed)
RN rounded on pt. Pt states she does not need anything at this time. 

## 2018-07-09 NOTE — Progress Notes (Signed)
Pt placed on continuous pulse ox per order. 

## 2018-07-09 NOTE — Care Management Important Message (Signed)
Important Message  Patient Details  Name: NAJAI WASZAK MRN: [id number] Date of Birth: 03-25-1946   Medicare Important Message Given:  Yes    Barb Merino Janeah Kovacich 07/09/2018, 11:32 AM

## 2018-07-10 ENCOUNTER — Inpatient Hospital Stay (HOSPITAL_COMMUNITY): Payer: Medicare Other

## 2018-07-10 LAB — BLOOD GAS, ARTERIAL
Acid-Base Excess: 4.2 mmol/L — ABNORMAL HIGH (ref 0.0–2.0)
Bicarbonate: 28.5 mmol/L — ABNORMAL HIGH (ref 20.0–28.0)
Drawn by: 23703
O2 Content: 5 L/min
O2 Saturation: 82.8 %
Patient temperature: 98.2
pCO2 arterial: 44.1 mmHg (ref 32.0–48.0)
pH, Arterial: 7.424 (ref 7.350–7.450)
pO2, Arterial: 47.6 mmHg — ABNORMAL LOW (ref 83.0–108.0)

## 2018-07-10 MED ORDER — METHYLPREDNISOLONE SODIUM SUCC 125 MG IJ SOLR
60.0000 mg | Freq: Two times a day (BID) | INTRAMUSCULAR | Status: DC
  Administered 2018-07-10: 60 mg via INTRAVENOUS
  Filled 2018-07-10: qty 2

## 2018-07-10 NOTE — Progress Notes (Signed)
The patient's RN called for patient assessment as the patient's o2 saturation had dropped on a 6lpm N/C. The patient's o2 sat was 89% upon arrival. An ABG was obtained with critical pO2 values. The patient's o2 was increased to 12lpm HFNC with o2 saturation is currently at 93%. There is a BIPAP order for the patient if needed to maintain o2 saturation of 92% or greater. The patient is currently in no distress at this time.

## 2018-07-10 NOTE — Progress Notes (Addendum)
Pt has orders for transfer to progressive, report given to 4E nurse. Pt stable, 92% on [place] on continous pulseox, vss, sr on telelmetry, pt transer to 4e02 in stable condition, Michelle at bedside and all questions entertained and answered, bellongings at bedside

## 2018-07-10 NOTE — Progress Notes (Signed)
Oncology: I reviewed the patient's scans and discussed with hospitalist team. Awaiting the results of cytology from the thoracentesis. If the cytology is negative then the patient may need a bronchoscopy and biopsy. Prior history of breast cancer was in remission currently on tamoxifen. I suspect that this could be a second primary. Please do not hesitate to call the on-call oncologist if there are any questions or concerns. I will be back on 12/26 or I can follow her up as an outpatient. Thank you very much

## 2018-07-10 NOTE — Progress Notes (Signed)
When making rounds, pt has continous pulse in place reading 82-85% on 6l high flow oxygen, order for keep >92%, also note from Dr [place] Crane states same, pt has coarse crackles in bases otherwise clear, productive cough of pink tinged thin secretions, iv lasix and iv solumedrol given early, muciniex po, pt says she feels sob when she takes a deep breath, no obvious distress noted, nurse from night shift reports purewick placed as when she gets up the sats drop further, paged Dr Nevada Crane to advise in sats. , see orders

## 2018-07-10 NOTE — Progress Notes (Addendum)
PROGRESS NOTE  BERKLEE BATTEY IBB:[id number] DOB: [birth year] DOA: 07/06/2018 PCP: Merrilee Seashore, MD  HPI/Recap of past 24 hours: Connye Burkitt Mahalikis a 72 y.o.femalewithhistory of COPD, hypothyroidism, hyperlipidemia, hypertension has been experiencing shortness of breath for the last 2 weeks. Shortness of breath is mostly exertional. And also noticed bilateral lower extremity edema. Denies any chest pain. Had gone to her PCP and was found to have low sodium and also hemoglobin which has been worked up. Since patient shortness of breath getting worse patient came to the ER. Patient not noticed any blood in the stools.  Admitted for acute hypoxic respiratory failure and hypervolemic hyponatremia.  Hospital course complicated by persistent dyspnea at rest.  CT angio PE ordered due to elevated d-dimer 2.86.  This revealed no pulmonary embolism however there were incidental findings of irregular 2.7 cm nodular focus of consolidation in the posterior left upper lobe with surrounding patchy groundglass opacity and spiculated 1.8 cm solid apical right upper lobe pulmonary nodule with morphologic suspicious for primary bronchogenic carcinoma Recommendations for follow-up in 1 month  Admits to current tobacco use more than 1+1/2 pack/day for at least 50 years.   07/07/18: Requiring 6 L of oxygen by nasal cannula.  Not previously on supplemental oxygen. 07/08/2018: Hypotensive overnight.  Was given IV albumin with improvement of her blood pressure.  Hemoglobin dropped this morning to 6.8.  Negative FOBT.  2 units PRBCs ordered to be transfused. 07/09/2018: Had left thoracentesis yesterday with 550 cc fluid removed.  Awaiting results of pathology.  Reports dyspnea with minimal movement.  Restarted IV Lasix with improvement of her blood pressure.  07/10/18: Conversational dyspnea noted. ABG revealed significant hypoxia. CXR revealed pulmonary edema. IV lasix administered. Will move to  progressive care unit for possible BIPAP if no improvement.   Assessment/Plan: Principal Problem:   Acute respiratory failure with hypoxia (HCC) Active Problems:   Peripheral arterial disease (HCC)   Essential hypertension   Breast cancer of upper-outer quadrant of left female breast ([place])   Acute CHF (congestive heart failure) (HCC)   Hyponatremia   Anemia   COPD with acute bronchitis (HCC)  Acute hypoxic respiratory failure secondary to bilateral pleural effusions versus COPD exacerbation versus others Independently reviewed CTA PE which was negative for PE but with pulmonary nodules on left and right lungs.  Recommendations for follow-up in 1 month. Currently requiring 6 L of oxygen by nasal cannula to maintain O2 saturation greater than 90% ABG done on 07/07/2018 independently reviewed with hypoxia Negative procalcitonin Maintain O2 saturation greater than 92% C/w IV Lasix 40 mg daily  Pulmonary edema with unclear etiology Does not suspect cardiogenic or infective in nature independently reviewed cxr done today which revealed increase in pulm vascularity ABG revealed significant hypoxia w PaO2 in the 40's. Recent 2D echo and recent procalcitonin unrevealing C/w symptomatic treatment BIPAP at night to maintain O2 sat 92%  Diuretic while maintaining MAP>65 C/w IV steroids NPO when on BIPAP to prevent aspiration  Acute COPD exacerbation Continue IV Solu-Medrol 60 mg BID Continue duo nebs every 6 hours Continue pulmonary toilet with Mucinex 1200 mg twice daily and hypersaline nebs 3 times daily Continue COPD medications C/w Z-Pak x5 days for its anti-inflammatory properties Procalcitonin less than 0.10 on 07/07/2018  Symptomatic anemia/iron deficiency anemia Post 2 unit PRBC transfusion Hemoglobin from 6.5 to 10.9  No sign of overt bleeding FOBT negative Continue ferrous sulfate 325 mg twice daily Repeat CBC in the morning  Acute on chronic hypervolemic  hyponatremia Improving sodium 134 Suspect SIADH Presented with sodium of 121 Continue with diuretics IV Lasix 40 mg daily Continue to monitor urine output and fluid status  Resolving AKI Baseline creatinine appears to be 0.67 with GFR greater than 60 Today creatinine is 0.76 from 0.89 with GFR greater than 60 Avoid nephrotoxic agents/dehydration/hypotension Monitor urine output Repeat BMP in the morning  Bilateral pulmonary nodules with suspicion for primary bronchogenic carcinoma Post left thoracentesis Awaiting results of fluid analysis/pathology  Bilateral pleural effusion greater on the left 2D echo done on 07/06/2018 was unrevealing with LVEF 60 to 65% with no regional wall motion abnormality Reported normal LVEF 60 to 65% with no regional wall motion abnormalities Post left thoracentesis Repeated chest x-ray done on 07/10/2018 independently reviewed revealed bilateral pleural effusion greater on the right and increase in pulmonary vascularity  Hypotension Continue to hold off all antihypertensive medications Rule out active infective process Maintain map greater than 65  Severe protein calorie malnutrition Albumin 2.7 and BMI of 18  Hypothyroidism Continue levothyroxine  Iron deficiency anemia/symptomatic anemia Continue ferrous sulfate 325 mg twice daily  Hyperlipidemia Continue Crestor  History of peripheral arterial disease Continue aspirin, Plavix and statin  History of breast cancer diagnosed 2 years ago On tamoxifen Curbside with Dr. Marye Round her oncologist    DVT prophylaxis:  Subcu Lovenox daily. Code Status: Full code. Family Communication: Discussed with patient. Disposition Plan: Transfer to Progressive care; higher level of care due to persistent hypoxia requiring non invasive mechanical ventilation. Consults called:  None    Objective: Vitals:   07/10/18 1000 07/10/18 1041 07/10/18 1052 07/10/18 1334  BP: 105/70 122/70    Pulse: 97 97 90    Resp:  (!) 22 15   Temp:  97.9 F (36.6 C)    TempSrc:  Oral    SpO2:  90% 96% 95%  Weight:      Height:        Intake/Output Summary (Last 24 hours) at 07/10/2018 1410 Last data filed at 07/10/2018 1000 Gross per 24 hour  Intake 957 ml  Output 550 ml  Net 407 ml   Filed Weights   07/08/18 0128 07/09/18 0500 07/10/18 0509  Weight: 45.2 kg 47.6 kg 47.7 kg    Exam:  . General: 72 y.o. year-old female frail.  Appears uncomfortable due to use of accessory muscles to breathe.  Alert and oriented x3.   . Cardiovascular: Regular rate and rhythm with no rubs or gallops.  No JVD or thyromegaly noted. Marland Kitchen Respiratory: Mild rales at bases with no wheezes.  Poor inspiratory effort. . Abdomen: Soft nontender nondistended with normal bowel sounds x4 quadrants. . Musculoskeletal: No lower extremity edema. 2/4 pulses in all 4 extremities. . Skin: No ulcerative lesions noted or rashes . Psychiatry: Mood is appropriate for condition and setting   Data Reviewed: CBC: Recent Labs  Lab 07/06/18 0058 07/07/18 0457 07/08/18 0449 07/09/18 0344  WBC 8.0 8.1 6.5 8.2  NEUTROABS 6.3  --  4.4  --   HGB 7.9* 7.6* 6.5* 10.9*  HCT 25.0* 24.3* 21.0* 32.5*  MCV 88.3 89.3 90.5 89.3  PLT 312 303 269 299   Basic Metabolic Panel: Recent Labs  Lab 07/06/18 0948 07/06/18 1425 07/06/18 1643 07/08/18 0449 07/09/18 0344  NA 131* 132* 134* 133* 134*  K 3.3* 4.2 4.2 3.9 4.3  CL 93* 94* 98 98 100  CO2 27 27 27 27  21*  GLUCOSE 112* 89 134* 87 126*  BUN 5* 7* 9 12  12  CREATININE 0.83 0.67 1.07* 0.89 0.76  CALCIUM 8.5* 8.5* 8.4* 8.5* 8.8*   GFR: Estimated Creatinine Clearance: 45.7 mL/min (by C-G formula based on SCr of 0.76 mg/dL). Liver Function Tests: Recent Labs  Lab 07/06/18 0644  AST 17  ALT 14  ALKPHOS 51  BILITOT 0.4  PROT 5.4*  ALBUMIN 2.7*   No results for input(s): LIPASE, AMYLASE in the last 168 hours. No results for input(s): AMMONIA in the last 168 hours. Coagulation  Profile: No results for input(s): INR, PROTIME in the last 168 hours. Cardiac Enzymes: Recent Labs  Lab 07/06/18 0644 07/06/18 1425 07/06/18 1643  TROPONINI <0.03 <0.03 <0.03   BNP (last 3 results) No results for input(s): PROBNP in the last 8760 hours. HbA1C: No results for input(s): HGBA1C in the last 72 hours. CBG: No results for input(s): GLUCAP in the last 168 hours. Lipid Profile: No results for input(s): CHOL, HDL, LDLCALC, TRIG, CHOLHDL, LDLDIRECT in the last 72 hours. Thyroid Function Tests: No results for input(s): TSH, T4TOTAL, FREET4, T3FREE, THYROIDAB in the last 72 hours. Anemia Panel: No results for input(s): VITAMINB12, FOLATE, FERRITIN, TIBC, IRON, RETICCTPCT in the last 72 hours. Urine analysis:    Component Value Date/Time   COLORURINE YELLOW 03/27/2012 1119   APPEARANCEUR CLEAR 03/27/2012 1119   LABSPEC 1.024 03/27/2012 1119   PHURINE 5.5 03/27/2012 1119   GLUCOSEU NEGATIVE 03/27/2012 1119   HGBUR NEGATIVE 03/27/2012 1119   BILIRUBINUR NEGATIVE 03/27/2012 1119   KETONESUR 15 (A) 03/27/2012 1119   PROTEINUR NEGATIVE 03/27/2012 1119   UROBILINOGEN 1.0 03/27/2012 1119   NITRITE NEGATIVE 03/27/2012 1119   LEUKOCYTESUR MODERATE (A) 03/27/2012 1119   Sepsis Labs: @LABRCNTIP (procalcitonin:4,lacticidven:4)  ) Recent Results (from the past 240 hour(s))  Gram stain     Status: None   Collection Time: 07/08/18  4:24 PM  Result Value Ref Range Status   Specimen Description PLEURAL L  Final   Special Requests NONE  Final   Gram Stain   Final    WBC PRESENT,BOTH PMN AND MONONUCLEAR NO ORGANISMS SEEN CYTOSPIN SMEAR Performed at [facility], [address], [place], [place] [address]    Report Status 07/08/2018 FINAL  Final      Studies: Dg Chest Port 1 View  Result Date: 07/10/2018 CLINICAL DATA:  Hypoxia, history breast cancer, hypertension, smoker EXAM: PORTABLE CHEST 1 VIEW COMPARISON:  Portable exam 0802 hours compared to 07/08/2018  FINDINGS: Enlargement of cardiac silhouette with vascular congestion. Atherosclerotic calcification aorta. Diffuse interstitial infiltrates compatible with pulmonary edema, increased. Slightly increased bibasilar pleural effusions and atelectasis. No pneumothorax. Bones demineralized. IMPRESSION: Slightly increased pulmonary edema, bibasilar effusions and atelectasis. Electronically Signed   By: Lavonia [name] M.D.   On: 07/10/2018 08:36    Scheduled Meds: . aspirin  81 mg Oral Daily  . azithromycin  250 mg Oral Daily  . calcium citrate  1,200 mg of elemental calcium Oral Daily  . clopidogrel  75 mg Oral Daily  . dextromethorphan-guaiFENesin  2 tablet Oral BID  . enoxaparin (LOVENOX) injection  40 mg Subcutaneous Q24H  . feeding supplement (ENSURE ENLIVE)  237 mL Oral QHS  . ferrous sulfate  325 mg Oral BID WC  . furosemide  40 mg Intravenous Daily  . ipratropium-albuterol  3 mL Nebulization Q6H  . levothyroxine  88 mcg Oral Q24H  . methylPREDNISolone (SOLU-MEDROL) injection  60 mg Intravenous TID  . multivitamin with minerals  1 tablet Oral Daily  . pantoprazole  40 mg Oral  Daily  . rosuvastatin  10 mg Oral Daily  . sodium chloride HYPERTONIC  4 mL Nebulization TID  . tamoxifen  20 mg Oral Daily  . umeclidinium-vilanterol  1 puff Inhalation Daily    Continuous Infusions:   LOS: 4 days     Kayleen Memos, MD [facility] Pager [phone]  If 7PM-7AM, please contact night-coverage [email or web address] Password [email or web address] 07/10/2018, 2:10 PM

## 2018-07-11 DIAGNOSIS — J9601 Acute respiratory failure with hypoxia: Secondary | ICD-10-CM

## 2018-07-11 LAB — BASIC METABOLIC PANEL
Anion gap: 10 (ref 5–15)
BUN: 20 mg/dL (ref 8–23)
CO2: 27 mmol/L (ref 22–32)
Calcium: 8.9 mg/dL (ref 8.9–10.3)
Chloride: 100 mmol/L (ref 98–111)
Creatinine, Ser: 0.89 mg/dL (ref 0.44–1.00)
GFR calc Af Amer: >60 mL/min (ref >60)
GFR calc non Af Amer: >60 mL/min (ref >60)
Glucose, Bld: 138 mg/dL — ABNORMAL HIGH (ref 70–99)
Potassium: 3.8 mmol/L (ref 3.5–5.1)
Sodium: 137 mmol/L (ref 135–145)

## 2018-07-11 LAB — CBC WITH DIFFERENTIAL/PLATELET
Abs Immature Granulocytes: 0.09 10*3/uL — ABNORMAL HIGH (ref 0.00–0.07)
Basophils Absolute: 0 10*3/uL (ref 0.0–0.1)
Basophils Relative: 0 %
Eosinophils Absolute: 0 10*3/uL (ref 0.0–0.5)
Eosinophils Relative: 0 %
HCT: 35.5 % — ABNORMAL LOW (ref 36.0–46.0)
Hemoglobin: 11.2 g/dL — ABNORMAL LOW (ref 12.0–15.0)
Immature Granulocytes: 1 %
Lymphocytes Relative: 3 %
Lymphs Abs: 0.4 10*3/uL — ABNORMAL LOW (ref 0.7–4.0)
MCH: 28.4 pg (ref 26.0–34.0)
MCHC: 31.5 g/dL (ref 30.0–36.0)
MCV: 90.1 fL (ref 80.0–100.0)
Monocytes Absolute: 0.5 10*3/uL (ref 0.1–1.0)
Monocytes Relative: 4 %
Neutro Abs: 12.4 10*3/uL — ABNORMAL HIGH (ref 1.7–7.7)
Neutrophils Relative %: 92 %
Platelets: 309 10*3/uL (ref 150–400)
RBC: 3.94 MIL/uL (ref 3.87–5.11)
RDW: 16.5 % — ABNORMAL HIGH (ref 11.5–15.5)
WBC: 13.5 10*3/uL — ABNORMAL HIGH (ref 4.0–10.5)
nRBC: 0 % (ref 0.0–0.2)

## 2018-07-11 MED ORDER — METHYLPREDNISOLONE SODIUM SUCC 125 MG IJ SOLR
60.0000 mg | Freq: Every day | INTRAMUSCULAR | Status: DC
  Administered 2018-07-11 – 2018-07-13 (×3): 60 mg via INTRAVENOUS
  Filled 2018-07-11 (×3): qty 2

## 2018-07-11 NOTE — Consult Note (Signed)
NAME:  Kendra Watts, MRN:  [id number], DOB:  11/12/1945, LOS: 5 ADMISSION DATE:  07/06/2018, CONSULTATION DATE: 07/11/2018 REFERRING MD: Dr. Nevada Crane, CHIEF COMPLAINT: Lung nodule  Brief History   Patient was admitted with complaints of shortness of breath of a few days duration Past history of COPD   History of present illness   Came into the hospital with exertional shortness of breath of a few days duration Recently had abnormal labs low sodium, low hematocrit Worsening shortness of breath led to presentation to the hospital Evaluation so far did reveal the abnormal CT scan of the chest leading to this consultation  Past Medical History  Chronic obstructive pulmonary disease Hypothyroidism Hyperlipidemia Hypertension History of breast cancer 2017-lumpectomy and radiation treatment History of renal artery stenosis  Significant Hospital Events   Shortness of breath is better She has been diuresed  Consults:  PCCM  Procedures:  None Significant Diagnostic Tests:  Echocardiogram 07/06/2018-diastolic dysfunction, normal ejection fraction, normal pulmonary pressures PFT 05/28/2017-moderately severe obstructive lung disease  Micro Data:  Pleural fluid microbiology negative  Antimicrobials:  Azithromycin 07/10/18  Interim history/subjective:  Breathing feels a little bit better Denies any chest pains or chest discomfort   Objective   Blood pressure 138/84, pulse 74, temperature 98.6 F (37 C), temperature source Oral, resp. rate 13, height 5' (1.524 m), weight 49.2 kg, SpO2 96 %.    FiO2 (%):  [40 %-50 %] 40 %   Intake/Output Summary (Last 24 hours) at 07/11/2018 1406 Last data filed at 07/11/2018 0230 Gross per 24 hour  Intake -  Output 800 ml  Net -800 ml   Filed Weights   07/09/18 0500 07/10/18 0509 07/11/18 0628  Weight: 47.6 kg 47.7 kg 49.2 kg    Examination: General: Elderly lady, does not appear to be in extremis, no significant shortness of breath  at rest HENT: Moist oral mucosa Lungs: Fair air entry bilaterally, decreased at the bases Cardiovascular: S1-S2 appreciated Abdomen: Bowel sounds appreciated, soft nontender Extremities: No clubbing Neuro: Alert oriented x3 nonfocal GU: Fair output  Resolved Hospital Problem list     Assessment & Plan:   Multiple lung nodules -Nodules are located in the area that is notoriously difficult to access -She will benefit from navigational bronchoscopy -Obtaining a PET scan prior to biopsy may be of benefit as well as this may guide which nodule to go after -This has to be taken in the context of a known moderately severe obstructive sleep apnea with recent decompensation -Navigational bronchoscopy will have better yield and less risk-nodules close to fissures and very apical  Moderately severe obstructive lung disease -Continue bronchodilator treatments -Continue steroids -Active smoker-smoking cessation counseling  Probable diastolic heart failure -Being diuresed -Optimize cardiac risk  Hypoxemic respiratory failure -Continue oxygen supplementation -Continue bronchodilators -Continue course of treatment for exacerbation  Pleural effusion -Post thoracentesis, transudative fluid, negative cytology  Leukocytosis -Likely related to steroid use  History of breast cancer -Treated 2 years ago with radiation treatments  Nodules in the apices are usually very difficult to biopsy A navigational CT and navigational procedure will offer better yield Obtaining a PET scan prior to bronchoscopy may guide which nodule to focus on  Best option will be to schedule as an outpatient  I did speak with radiology-they will make available a CT-super dimension CT that will aid bronchoscopic sampling  Best practice:  Diet: Deferred to primary Pain/Anxiety/Delirium protocol (if indicated):  VAP protocol (if indicated):  DVT prophylaxis: Lovenox GI prophylaxis: Protonix Mobility:  As  tolerated Code Status: Full code Family Communication: Discussed with niece at bedside Disposition:   Labs   CBC: Recent Labs  Lab 07/06/18 0058 07/07/18 0457 07/08/18 0449 07/09/18 0344 07/11/18 0231  WBC 8.0 8.1 6.5 8.2 13.5*  NEUTROABS 6.3  --  4.4  --  12.4*  HGB 7.9* 7.6* 6.5* 10.9* 11.2*  HCT 25.0* 24.3* 21.0* 32.5* 35.5*  MCV 88.3 89.3 90.5 89.3 90.1  PLT 312 303 269 287 335    Basic Metabolic Panel: Recent Labs  Lab 07/06/18 1425 07/06/18 1643 07/08/18 0449 07/09/18 0344 07/11/18 0231  NA 132* 134* 133* 134* 137  K 4.2 4.2 3.9 4.3 3.8  CL 94* 98 98 100 100  CO2 27 27 27  21* 27  GLUCOSE 89 134* 87 126* 138*  BUN 7* 9 12 12 20   CREATININE 0.67 1.07* 0.89 0.76 0.89  CALCIUM 8.5* 8.4* 8.5* 8.8* 8.9   GFR: Estimated Creatinine Clearance: 41 mL/min (by C-G formula based on SCr of 0.89 mg/dL). Recent Labs  Lab 07/07/18 0457 07/07/18 1430 07/08/18 0449 07/09/18 0344 07/11/18 0231  PROCALCITON  --  <0.10  --   --   --   WBC 8.1  --  6.5 8.2 13.5*    Liver Function Tests: Recent Labs  Lab 07/06/18 0644  AST 17  ALT 14  ALKPHOS 51  BILITOT 0.4  PROT 5.4*  ALBUMIN 2.7*   No results for input(s): LIPASE, AMYLASE in the last 168 hours. No results for input(s): AMMONIA in the last 168 hours.  ABG    Component Value Date/Time   PHART 7.424 07/10/2018 0811   PCO2ART 44.1 07/10/2018 0811   PO2ART 47.6 (L) 07/10/2018 0811   HCO3 28.5 (H) 07/10/2018 0811   O2SAT 82.8 07/10/2018 0811     Coagulation Profile: No results for input(s): INR, PROTIME in the last 168 hours.  Cardiac Enzymes: Recent Labs  Lab 07/06/18 0644 07/06/18 1425 07/06/18 1643  TROPONINI <0.03 <0.03 <0.03    HbA1C: No results found for: HGBA1C  CBG: No results for input(s): GLUCAP in the last 168 hours.  Review of Systems:   Review of Systems  Constitutional: Negative.  Negative for fever.  HENT: Negative.   Eyes: Negative.   Respiratory: Positive for shortness of  breath.   Cardiovascular: Negative.   Gastrointestinal: Negative.   Genitourinary: Negative.   Skin: Negative.   All other systems reviewed and are negative.  Past Medical History  She,  has a past medical history of Anemia, Aortic arch anomaly, Breast cancer ([place]) (09/22/2015), Breast cancer of upper-outer quadrant of left female breast ([place]) (09/08/2015), Cataract, immature, Heart murmur, History of hyperthyroidism, Hyperlipidemia, Hypertension, Hypothyroidism, Nonfunctioning kidney, Personal history of radiation therapy, Radiation (10/30/15-11/28/15), Renal artery stenosis ([place]), Tobacco abuse, and Wears partial dentures.   Surgical History    Past Surgical History:  Procedure Laterality Date  . ABDOMINAL ANGIOGRAM  02/18/2012   Procedure: ABDOMINAL ANGIOGRAM;  Surgeon: Lorretta Harp, MD;  Location: [facility] CATH LAB;  Service: Cardiovascular;;  . ABDOMINAL AORTAGRAM  07/04/2014  . ABDOMINAL HYSTERECTOMY  ~ 1977   partial  . APPENDECTOMY    . ARCH AORTOGRAM    . BREAST LUMPECTOMY Left 09/22/2015   Malignant  . CAROTID ANGIOGRAM N/A 02/18/2012   Procedure: CAROTID ANGIOGRAM;  Surgeon: Lorretta Harp, MD;  Location: [facility] CATH LAB;  Service: Cardiovascular;  Laterality: N/A;  . ENDARTERECTOMY  04/02/2012   Procedure: ENDARTERECTOMY CAROTID;  Surgeon: Serafina Mitchell, MD;  Location:  [facility] OR;  Service: Vascular;  Laterality: Right;  . IR THORACENTESIS ASP PLEURAL SPACE W/IMG GUIDE  07/08/2018  . RADIOACTIVE SEED GUIDED PARTIAL MASTECTOMY WITH AXILLARY SENTINEL LYMPH NODE BIOPSY Left 09/22/2015   Procedure: INJECT BLUE DYE LEFT BREAST,RADIOACTIVE SEED GUIDED PARTIAL MASTECTOMY WITH AXILLARY SENTINEL LYMPH NODE BIOPSY;  Surgeon: Fanny Skates, MD;  Location: [facility];  Service: General;  Laterality: Left;  . RENAL ANGIOGRAM Left 06/08/2010   renal artery stent -  5x12 Genesis on Aviator balloon stent (Dr. Adora Fridge)  . RENAL ANGIOGRAM Right 07/04/2014   Procedure: RENAL ANGIOGRAM;   Surgeon: Lorretta Harp, MD;  Location: [facility] CATH LAB;  Service: Cardiovascular;  Laterality: Right;  . RENAL ANGIOGRAM Right 08/22/2014   Procedure: RENAL ANGIOGRAM;  Surgeon: Lorretta Harp, MD;  Location: [facility] CATH LAB;  Service: Cardiovascular;  Laterality: Right;  . TONSILLECTOMY     as a child     Social History   reports that she has been smoking cigarettes. She has a 60.00 pack-year smoking history. She has never used smokeless tobacco. She reports current alcohol use. She reports that she does not use drugs.   Family History   Her family history includes Breast cancer in her paternal aunt; Cancer in her father; Heart disease in her maternal grandmother and mother; Lung cancer in her father and sister.   Allergies No Known Allergies

## 2018-07-11 NOTE — Plan of Care (Signed)
  Problem: Clinical Measurements: Goal: Will remain free from infection Outcome: Progressing Goal: Respiratory complications will improve Outcome: Progressing   Problem: Coping: Goal: Level of anxiety will decrease Outcome: Progressing

## 2018-07-11 NOTE — Progress Notes (Addendum)
PROGRESS NOTE  KASSIDEE NARCISO QJJ:[id number] DOB: 10/22/1945 DOA: 07/06/2018 PCP: Merrilee Seashore, MD  HPI/Recap of past 24 hours: Connye Burkitt Mahalikis a 72 y.o.femalewithhistory of COPD, hypothyroidism, hyperlipidemia, hypertension has been experiencing shortness of breath for the last 2 weeks. Shortness of breath is mostly exertional. And also noticed bilateral lower extremity edema. Denies any chest pain. Had gone to her PCP and was found to have low sodium and also hemoglobin which has been worked up. Since patient shortness of breath getting worse patient came to the ER. Patient not noticed any blood in the stools.  Admitted for acute hypoxic respiratory failure and hypervolemic hyponatremia.  Hospital course complicated by persistent dyspnea at rest.  CT angio PE ordered due to elevated d-dimer 2.86.  This revealed no pulmonary embolism however there were incidental findings of irregular 2.7 cm nodular focus of consolidation in the posterior left upper lobe with surrounding patchy groundglass opacity and spiculated 1.8 cm solid apical right upper lobe pulmonary nodule with morphologic suspicious for primary bronchogenic carcinoma Recommendations for follow-up in 1 month  Admits to current tobacco use more than 1+1/2 pack/day for at least 50 years.  Hospital course complicated by symptomatic anemia with hemoglobin of 6.8 and negative FOBT, was transfused 2 unit PRBCs.  Persistent hypoxia and bilateral pleural effusion requiring thoracentesis.  Left thoracentesis completed on 07/08/2018 by interventional radiology with 550 cc fluid removed.  Fluid analysis revealed reactive mesothelial cells.  Still hypoxic and requiring noninvasive positive pressure mechanical ventilation, BiPAP, intermittently.  07/11/18: Patient seen and examined at her bedside.  Mild to moderate conversational dyspnea.  She wore BiPAP overnight.  Still requiring high level of oxygen to maintain O2 saturation  greater than 90%.  Pulmonology consulted for possible bronchoscopy and biopsy.  Assessment/Plan: Principal Problem:   Acute respiratory failure with hypoxia (HCC) Active Problems:   Peripheral arterial disease (HCC)   Essential hypertension   Breast cancer of upper-outer quadrant of left female breast ([place])   Acute CHF (congestive heart failure) (HCC)   Hyponatremia   Anemia   COPD with acute bronchitis (HCC)  Acute hypoxic respiratory failure secondary to bilateral pleural effusions versus COPD exacerbation versus others Independently reviewed CTA PE which was negative for PE but with pulmonary nodules on left and right lungs.  Recommendations for follow-up in 1 month. Currently requiring 6 L of oxygen by nasal cannula to maintain O2 saturation greater than 90% ABG done on 07/07/2018 independently reviewed with hypoxia Negative procalcitonin Maintain O2 saturation greater than 92% C/w IV Lasix 40 mg daily  Bilateral pulmonary nodules with suspicion for primary bronchogenic carcinoma Post left thoracentesis Fluid analysis revealed reactive mesothelial cells Pulmonology consulted for possible bronchoscopy and biopsy  Pulmonary edema with unclear etiology Does not suspect cardiogenic or infective in nature independently reviewed cxr done today which revealed increase in pulm vascularity ABG revealed significant hypoxia w PaO2 in the 40's. Recent 2D echo and recent procalcitonin unrevealing C/w symptomatic treatment BIPAP at night to maintain O2 sat 92%  Diuretic while maintaining MAP>65 C/w IV steroids NPO when on BIPAP to prevent aspiration  Acute COPD exacerbation Continue IV Solu-Medrol 60 mg daily Continue duo nebs every 6 hours Completed pulmonary toilet with Mucinex 1200 mg twice daily and hypersaline nebs 3 times daily Continue COPD medications C/w Z-Pak x5 days for its anti-inflammatory properties Procalcitonin less than 0.10 on 07/07/2018  Symptomatic anemia/iron  deficiency anemia Post 2 unit PRBC transfusion Hemoglobin from 6.5 to 10.9  No sign of overt  bleeding FOBT negative Continue ferrous sulfate 325 mg twice daily Repeat CBC in the morning  Acute on chronic hypervolemic hyponatremia Improving sodium 134 Suspect SIADH Presented with sodium of 121 Continue with diuretics IV Lasix 40 mg daily Continue to monitor urine output and fluid status  Resolving AKI Baseline creatinine appears to be 0.67 with GFR greater than 60 Today creatinine is 0.76 from 0.89 with GFR greater than 60 Avoid nephrotoxic agents/dehydration/hypotension Monitor urine output Repeat BMP in the morning  Bilateral pleural effusion greater on the left 2D echo done on 07/06/2018 was unrevealing with LVEF 60 to 65% with no regional wall motion abnormality Reported normal LVEF 60 to 65% with no regional wall motion abnormalities Post left thoracentesis Repeated chest x-ray done on 07/10/2018 independently reviewed revealed bilateral pleural effusion greater on the right and increase in pulmonary vascularity  Hypertension Blood pressure stable Continue to hold off all antihypertensive medications Maintain map greater than 65  Leukocytosis, suspect reactive No sign of active infective process On steroids  Severe protein calorie malnutrition Albumin 2.7 and BMI of 18  Hypothyroidism Continue levothyroxine  Iron deficiency anemia/symptomatic anemia Continue ferrous sulfate 325 mg twice daily  Hyperlipidemia Continue Crestor  History of peripheral arterial disease Continue aspirin, Plavix and statin  History of breast cancer diagnosed 2 years ago On tamoxifen Curbside with Dr. Marye Round her oncologist He recommends bronchoscopy with biopsy if fluid analysis is negative    DVT prophylaxis:  Subcu Lovenox daily. Code Status: Full code. Family Communication: Discussed with patient. Disposition Plan:  On 07/10/2018 transfer to Progressive care; higher level  of care due to persistent hypoxia requiring non invasive mechanical ventilation. Consults called:  Pulmonology    Objective: Vitals:   07/11/18 0715 07/11/18 0716 07/11/18 1208 07/11/18 1319  BP:   138/84   Pulse: 81  74   Resp: 15  (!) 22 13  Temp:   98.6 F (37 C)   TempSrc:   Oral   SpO2: 98% 94% 96% 96%  Weight:      Height:        Intake/Output Summary (Last 24 hours) at 07/11/2018 1424 Last data filed at 07/11/2018 0230 Gross per 24 hour  Intake -  Output 800 ml  Net -800 ml   Filed Weights   07/09/18 0500 07/10/18 0509 07/11/18 0628  Weight: 47.6 kg 47.7 kg 49.2 kg    Exam:  . General: 72 y.o. year-old female frail appears uncomfortable due to dyspnea.  Alert and oriented x3.   . Cardiovascular: Regular rate and rhythm with no rubs or gallops.  No JVD or thyromegaly noted.. . Respiratory: Mild rales at bases with no wheezes.  Point to effort. . Abdomen: Soft nontender nondistended with normal bowel sounds x4 quadrants. . Musculoskeletal: No lower extremity edema. 2/4 pulses in all 4 extremities. . Skin: No ulcerative lesions noted or rashes . Psychiatry: Mood is appropriate for condition and setting   Data Reviewed: CBC: Recent Labs  Lab 07/06/18 0058 07/07/18 0457 07/08/18 0449 07/09/18 0344 07/11/18 0231  WBC 8.0 8.1 6.5 8.2 13.5*  NEUTROABS 6.3  --  4.4  --  12.4*  HGB 7.9* 7.6* 6.5* 10.9* 11.2*  HCT 25.0* 24.3* 21.0* 32.5* 35.5*  MCV 88.3 89.3 90.5 89.3 90.1  PLT 312 303 269 287 970   Basic Metabolic Panel: Recent Labs  Lab 07/06/18 1425 07/06/18 1643 07/08/18 0449 07/09/18 0344 07/11/18 0231  NA 132* 134* 133* 134* 137  K 4.2 4.2 3.9 4.3 3.8  CL 94* 98 98 100 100  CO2 27 27 27  21* 27  GLUCOSE 89 134* 87 126* 138*  BUN 7* 9 12 12 20   CREATININE 0.67 1.07* 0.89 0.76 0.89  CALCIUM 8.5* 8.4* 8.5* 8.8* 8.9   GFR: Estimated Creatinine Clearance: 41 mL/min (by C-G formula based on SCr of 0.89 mg/dL). Liver Function Tests: Recent Labs    Lab 07/06/18 0644  AST 17  ALT 14  ALKPHOS 51  BILITOT 0.4  PROT 5.4*  ALBUMIN 2.7*   No results for input(s): LIPASE, AMYLASE in the last 168 hours. No results for input(s): AMMONIA in the last 168 hours. Coagulation Profile: No results for input(s): INR, PROTIME in the last 168 hours. Cardiac Enzymes: Recent Labs  Lab 07/06/18 0644 07/06/18 1425 07/06/18 1643  TROPONINI <0.03 <0.03 <0.03   BNP (last 3 results) No results for input(s): PROBNP in the last 8760 hours. HbA1C: No results for input(s): HGBA1C in the last 72 hours. CBG: No results for input(s): GLUCAP in the last 168 hours. Lipid Profile: No results for input(s): CHOL, HDL, LDLCALC, TRIG, CHOLHDL, LDLDIRECT in the last 72 hours. Thyroid Function Tests: No results for input(s): TSH, T4TOTAL, FREET4, T3FREE, THYROIDAB in the last 72 hours. Anemia Panel: No results for input(s): VITAMINB12, FOLATE, FERRITIN, TIBC, IRON, RETICCTPCT in the last 72 hours. Urine analysis:    Component Value Date/Time   COLORURINE YELLOW 03/27/2012 1119   APPEARANCEUR CLEAR 03/27/2012 1119   LABSPEC 1.024 03/27/2012 1119   PHURINE 5.5 03/27/2012 1119   GLUCOSEU NEGATIVE 03/27/2012 1119   HGBUR NEGATIVE 03/27/2012 1119   BILIRUBINUR NEGATIVE 03/27/2012 1119   KETONESUR 15 (A) 03/27/2012 1119   PROTEINUR NEGATIVE 03/27/2012 1119   UROBILINOGEN 1.0 03/27/2012 1119   NITRITE NEGATIVE 03/27/2012 1119   LEUKOCYTESUR MODERATE (A) 03/27/2012 1119   Sepsis Labs: @LABRCNTIP (procalcitonin:4,lacticidven:4)  ) Recent Results (from the past 240 hour(s))  Gram stain     Status: None   Collection Time: 07/08/18  4:24 PM  Result Value Ref Range Status   Specimen Description PLEURAL L  Final   Special Requests NONE  Final   Gram Stain   Final    WBC PRESENT,BOTH PMN AND MONONUCLEAR NO ORGANISMS SEEN CYTOSPIN SMEAR Performed at [facility], [address], [place], [place] [address]    Report Status 07/08/2018 FINAL  Final       Studies: No results found.  Scheduled Meds: . aspirin  81 mg Oral Daily  . azithromycin  250 mg Oral Daily  . calcium citrate  1,200 mg of elemental calcium Oral Daily  . clopidogrel  75 mg Oral Daily  . dextromethorphan-guaiFENesin  2 tablet Oral BID  . enoxaparin (LOVENOX) injection  40 mg Subcutaneous Q24H  . feeding supplement (ENSURE ENLIVE)  237 mL Oral QHS  . ferrous sulfate  325 mg Oral BID WC  . furosemide  40 mg Intravenous Daily  . ipratropium-albuterol  3 mL Nebulization Q6H  . levothyroxine  88 mcg Oral Q24H  . methylPREDNISolone (SOLU-MEDROL) injection  60 mg Intravenous Daily  . multivitamin with minerals  1 tablet Oral Daily  . pantoprazole  40 mg Oral Daily  . rosuvastatin  10 mg Oral Daily  . sodium chloride HYPERTONIC  4 mL Nebulization TID  . tamoxifen  20 mg Oral Daily  . umeclidinium-vilanterol  1 puff Inhalation Daily    Continuous Infusions:   LOS: 5 days     Kayleen Memos, MD [facility] Pager [phone]  If 7PM-7AM, please contact night-coverage [email or web address] Password [facility] 07/11/2018, 2:24 PM

## 2018-07-11 NOTE — Progress Notes (Signed)
Discussed with Dr. Nevada Crane  Patient is still significantly decompensated, significant oxygen requirement  CT reviewed by myself, requested for navigational images-spoke with radiology  PET scan as an outpatient  Most optimal scheduling will be following patient's PET scan as this may guide which nodule is more active which will be the focus of intervention

## 2018-07-12 LAB — PHOSPHORUS: Phosphorus: 3.2 mg/dL (ref 2.5–4.6)

## 2018-07-12 LAB — BASIC METABOLIC PANEL
Anion gap: 11 (ref 5–15)
BUN: 16 mg/dL (ref 8–23)
CO2: 30 mmol/L (ref 22–32)
Calcium: 9 mg/dL (ref 8.9–10.3)
Chloride: 99 mmol/L (ref 98–111)
Creatinine, Ser: 0.77 mg/dL (ref 0.44–1.00)
GFR calc Af Amer: >60 mL/min (ref >60)
GFR calc non Af Amer: >60 mL/min (ref >60)
Glucose, Bld: 89 mg/dL (ref 70–99)
Potassium: 3.1 mmol/L — ABNORMAL LOW (ref 3.5–5.1)
Sodium: 140 mmol/L (ref 135–145)

## 2018-07-12 LAB — CBC WITH DIFFERENTIAL/PLATELET
Abs Immature Granulocytes: 0.02 10*3/uL (ref 0.00–0.07)
Basophils Absolute: 0 10*3/uL (ref 0.0–0.1)
Basophils Relative: 0 %
Eosinophils Absolute: 0.2 10*3/uL (ref 0.0–0.5)
Eosinophils Relative: 3 %
HCT: 36.4 % (ref 36.0–46.0)
Hemoglobin: 11.7 g/dL — ABNORMAL LOW (ref 12.0–15.0)
Immature Granulocytes: 0 %
Lymphocytes Relative: 17 %
Lymphs Abs: 1.5 10*3/uL (ref 0.7–4.0)
MCH: 29.3 pg (ref 26.0–34.0)
MCHC: 32.1 g/dL (ref 30.0–36.0)
MCV: 91.2 fL (ref 80.0–100.0)
Monocytes Absolute: 1 10*3/uL (ref 0.1–1.0)
Monocytes Relative: 11 %
Neutro Abs: 6.1 10*3/uL (ref 1.7–7.7)
Neutrophils Relative %: 69 %
Platelets: 334 10*3/uL (ref 150–400)
RBC: 3.99 MIL/uL (ref 3.87–5.11)
RDW: 16.6 % — ABNORMAL HIGH (ref 11.5–15.5)
WBC: 8.8 10*3/uL (ref 4.0–10.5)
nRBC: 0 % (ref 0.0–0.2)

## 2018-07-12 LAB — MAGNESIUM: Magnesium: 2.1 mg/dL (ref 1.7–2.4)

## 2018-07-12 MED ORDER — INFLUENZA VAC SPLIT HIGH-DOSE 0.5 ML IM SUSY
0.5000 mL | PREFILLED_SYRINGE | INTRAMUSCULAR | Status: AC
  Administered 2018-07-13: 0.5 mL via INTRAMUSCULAR
  Filled 2018-07-12: qty 0.5

## 2018-07-12 MED ORDER — POTASSIUM CHLORIDE CRYS ER 20 MEQ PO TBCR
40.0000 meq | EXTENDED_RELEASE_TABLET | Freq: Two times a day (BID) | ORAL | Status: AC
  Administered 2018-07-12 (×2): 40 meq via ORAL
  Filled 2018-07-12 (×2): qty 2

## 2018-07-12 NOTE — Evaluation (Signed)
Physical Therapy Evaluation Patient Details Name: Kendra Watts MRN: [id number] DOB: Sep 24, 1945 Today's Date: 07/12/2018   History of Present Illness  Pt is a 72 y.o. F with significant PMH of COPD, hypothyroidism, hypertension who was admitted with acute hypoxic respiratory failure and hypervolemic hyponatremia. CT imaging concerning for bronchogenic carcinoma  Clinical Impression  Pt admitted with above diagnosis. Pt currently with functional limitations due to the deficits listed below (see PT Problem List). On PT evaluation, patient presenting with decreased mobility secondary to decreased activity tolerance. Ambulating in room with no assistive device without physical assistance. SpO2 90-94% on 6L O2. Educated on endurance conservation but will benefit from reinforcement and continued encouragement for progressing mobility. Pt will benefit from skilled PT to increase their independence and safety with mobility to allow discharge home.    Follow Up Recommendations No PT follow up;Supervision - Intermittent    Equipment Recommendations  None recommended by PT    Recommendations for Other Services       Precautions / Restrictions Precautions Precautions: Other (comment) Precaution Comments: O2 Restrictions Weight Bearing Restrictions: No      Mobility  Bed Mobility Overal bed mobility: Independent                Transfers Overall transfer level: Independent Equipment used: None             General transfer comment: Independent with transfers from edge of bed and toilet  Ambulation/Gait Ambulation/Gait assistance: Modified independent (Device/Increase time)(increased time) Gait Distance (Feet): 30 Feet Assistive device: None Gait Pattern/deviations: Step-through pattern;Decreased stride length     General Gait Details: No apparent balance deficits  Stairs            Wheelchair Mobility    Modified Rankin (Stroke Patients Only)       Balance  Overall balance assessment: No apparent balance deficits (not formally assessed)                                           Pertinent Vitals/Pain Pain Assessment: No/denies pain    Home Living Family/patient expects to be discharged to:: Private residence Living Arrangements: Alone Available Help at Discharge: Neighbor Type of Home: Apartment Home Access: Stairs to enter   Technical brewer of Steps: 1   Home Equipment: Cane - single point      Prior Function Level of Independence: Independent         Comments: Enjoys walking her dog     Hand Dominance        Extremity/Trunk Assessment   Upper Extremity Assessment Upper Extremity Assessment: Overall WFL for tasks assessed    Lower Extremity Assessment Lower Extremity Assessment: Overall WFL for tasks assessed    Cervical / Trunk Assessment Cervical / Trunk Assessment: Other exceptions Cervical / Trunk Exceptions: cachetic appearing  Communication   Communication: No difficulties  Cognition Arousal/Alertness: Awake/alert Behavior During Therapy: WFL for tasks assessed/performed Overall Cognitive Status: Within Functional Limits for tasks assessed                                 General Comments: Very pleasant and eager to participate      General Comments      Exercises     Assessment/Plan    PT Assessment Patient needs continued PT services  PT Problem List  Decreased activity tolerance;Cardiopulmonary status limiting activity       PT Treatment Interventions Gait training;Stair training;Functional mobility training;Therapeutic activities;Therapeutic exercise;Balance training;Patient/family education    PT Goals (Current goals can be found in the Care Plan section)  Acute Rehab PT Goals Patient Stated Goal: "go on walks with my dog." PT Goal Formulation: With patient Time For Goal Achievement: 07/26/18 Potential to Achieve Goals: Good    Frequency Min  3X/week   Barriers to discharge        Co-evaluation               AM-PAC PT "6 Clicks" Mobility  Outcome Measure Help needed turning from your back to your side while in a flat bed without using bedrails?: None Help needed moving from lying on your back to sitting on the side of a flat bed without using bedrails?: None Help needed moving to and from a bed to a chair (including a wheelchair)?: None Help needed standing up from a chair using your arms (e.g., wheelchair or bedside chair)?: None Help needed to walk in hospital room?: None Help needed climbing 3-5 steps with a railing? : A Little 6 Click Score: 23    End of Session Equipment Utilized During Treatment: Oxygen Activity Tolerance: Patient tolerated treatment well Patient left: in chair;with call bell/phone within reach Nurse Communication: Mobility status PT Visit Diagnosis: Difficulty in walking, not elsewhere classified (R26.2)    Time: 4628-6381 PT Time Calculation (min) (ACUTE ONLY): 31 min   Charges:   PT Evaluation $PT Eval Moderate Complexity: 1 Mod PT Treatments $Therapeutic Activity: 8-22 mins       Ellamae Sia, PT, DPT Acute Rehabilitation Services Pager [phone] Office [phone]  Willy Eddy 07/12/2018, 10:26 AM

## 2018-07-12 NOTE — Progress Notes (Signed)
PROGRESS NOTE  Kendra Watts RKY:[id number] DOB: 08/08/1945 DOA: 07/06/2018 PCP: Kendra Seashore, MD  HPI/Recap of past 24 hours: Kendra Burkitt Mahalikis a 72 y.o.femalewithhistory of COPD, hypothyroidism, hyperlipidemia, hypertension has been experiencing shortness of breath for the last 2 weeks. Shortness of breath is mostly exertional. And also noticed bilateral lower extremity edema. Denies any chest pain. Had gone to her PCP and was found to have low sodium and also hemoglobin which has been worked up. Since patient shortness of breath getting worse patient came to the ER. Patient not noticed any blood in the stools.  Admitted for acute hypoxic respiratory failure and hypervolemic hyponatremia.  Hospital course complicated by persistent dyspnea at rest.  CT angio PE ordered due to elevated d-dimer 2.86.  This revealed no pulmonary embolism however there were incidental findings of irregular 2.7 cm nodular focus of consolidation in the posterior left upper lobe with surrounding patchy groundglass opacity and spiculated 1.8 cm solid apical right upper lobe pulmonary nodule with morphologic suspicious for primary bronchogenic carcinoma Recommendations for follow-up in 1 month  Admits to current tobacco use more than 1+1/2 pack/day for at least 50 years.  Hospital course complicated by symptomatic anemia with hemoglobin of 6.8 and negative FOBT, was transfused 2 unit PRBCs.  Persistent hypoxia and bilateral pleural effusion requiring thoracentesis.  Left thoracentesis completed on 07/08/2018 by interventional radiology with 550 cc fluid removed.  Fluid analysis revealed reactive mesothelial cells.  Still hypoxic and requiring noninvasive positive pressure mechanical ventilation, BiPAP, intermittently.  07/11/18: Patient seen and examined at her bedside.  Mild to moderate conversational dyspnea.  She wore BiPAP overnight.  Still requiring high level of oxygen to maintain O2 saturation  greater than 90%.  Pulmonology consulted for possible bronchoscopy and biopsy.  07/12/18: Feels better, breathing is improving. Weaning off O2. Resume feeding. NPO when on BIPAP at night.  Assessment/Plan: Principal Problem:   Acute respiratory failure with hypoxia (HCC) Active Problems:   Peripheral arterial disease (HCC)   Essential hypertension   Breast cancer of upper-outer quadrant of left female breast ([place])   Acute CHF (congestive heart failure) (HCC)   Hyponatremia   Anemia   COPD with acute bronchitis (HCC)  Acute hypoxic respiratory failure secondary to bilateral pleural effusions versus COPD exacerbation versus others Independently reviewed CTA PE which was negative for PE but with pulmonary nodules on left and right lungs.  Recommendations for follow-up in 1 month. Currently requiring 6 L of oxygen by nasal cannula to maintain O2 saturation greater than 90% ABG done on 07/07/2018 independently reviewed with hypoxia Negative procalcitonin Maintain O2 saturation greater than 92% C/w IV Lasix 40 mg daily Obtain Home O2 evaluation for O2 qualification  Bilateral pulmonary nodules with suspicion for primary bronchogenic carcinoma Post left thoracentesis Fluid analysis revealed reactive mesothelial cells Pulmonology will see outpatient. Plan for PET scan first then navigational directed bronchoscopy Appreciate pulmonology involvement  Pulmonary edema with unclear etiology Does not suspect cardiogenic or infective in nature independently reviewed cxr done today which revealed increase in pulm vascularity ABG revealed significant hypoxia w PaO2 in the 40's. Recent 2D echo and recent procalcitonin unrevealing C/w supportive treatment BIPAP at night to maintain O2 sat 92%  Diuretic while maintaining BP with MAP>65 C/w IV steroids NPO when on BIPAP to prevent aspiration  Acute COPD exacerbation Continue IV Solu-Medrol 60 mg daily Continue duo nebs every 6 hours Completed  pulmonary toilet with Mucinex 1200 mg twice daily and hypersaline nebs 3 times daily Continue  COPD medications C/w Z-Pak x5 days for its anti-inflammatory properties Procalcitonin less than 0.10 on 07/07/2018  Hypokalemia in the setting of use of diuretic K+ 3.1 repleted with po kcl 40 meq x2 Repeat BMP am  Symptomatic anemia/iron deficiency anemia Post 2 unit PRBC transfusion Hemoglobin from 6.5 to 10.9  Hg stable at 11.7 today No sign of overt bleeding FOBT negative Continue ferrous sulfate 325 mg twice daily Repeat CBC in the morning  Acute on chronic hypervolemic hyponatremia Improving sodium 134 Suspect SIADH Presented with sodium of 121 Continue with diuretics IV Lasix 40 mg daily Continue to monitor urine output and fluid status  Resolving AKI Baseline creatinine appears to be 0.67 with GFR greater than 60 Today creatinine is 0.76 from 0.89 with GFR greater than 60 Avoid nephrotoxic agents/dehydration/hypotension Monitor urine output Repeat BMP in the morning  Bilateral pleural effusion greater on the left 2D echo done on 07/06/2018 was unrevealing with LVEF 60 to 65% with no regional wall motion abnormality Reported normal LVEF 60 to 65% with no regional wall motion abnormalities Post left thoracentesis Repeated chest x-ray done on 07/10/2018 independently reviewed revealed bilateral pleural effusion greater on the right and increase in pulmonary vascularity  Hypertension Blood pressure stable Continue to hold off all antihypertensive medications Maintain map greater than 65  Resolving Leukocytosis, suspect reactive No sign of active infective process On steroids  Severe protein calorie malnutrition Albumin 2.7 and BMI of 18  Hypothyroidism Continue levothyroxine  Iron deficiency anemia/symptomatic anemia Improving Hg 11.7 Continue ferrous sulfate 325 mg twice daily  Hyperlipidemia Continue Crestor  History of peripheral arterial disease Continue  aspirin, Plavix and statin  History of breast cancer diagnosed 2 years ago On tamoxifen Curbside with Dr. Marye Watts her oncologist He recommends bronchoscopy with biopsy if fluid analysis is negative    DVT prophylaxis:  Subcu Lovenox daily. Code Status: Full code. Family Communication: Discussed with patient. Disposition Plan:  On 07/10/2018 transfer to [facility] care; higher level of care due to persistent hypoxia requiring non invasive mechanical ventilation. DC to home possibly tomorrow 07/13/18. Consults called:  Pulmonology    Objective: Vitals:   07/12/18 0736 07/12/18 1059 07/12/18 1100 07/12/18 1328  BP:  105/81 105/81   Pulse:  80 75   Resp:  14 18   Temp:  98.1 F (36.7 C)    TempSrc:  Oral    SpO2: 96% 95% 97% 99%  Weight:      Height:        Intake/Output Summary (Last 24 hours) at 07/12/2018 1502 Last data filed at 07/11/2018 1711 Gross per 24 hour  Intake -  Output 400 ml  Net -400 ml   Filed Weights   07/10/18 0509 07/11/18 0628 07/12/18 0500  Weight: 47.7 kg 49.2 kg 48.1 kg    Exam:  . General: 72 y.o. year-old female frail in no acute distress.  Alert and oriented x3. . Cardiovascular: Regular rate and rhythm with no rubs or gallops.  No JVD or thyromegaly noted. Kendra Kitchen Respiratory: Mild rales at bases.  With no wheezes.  Poor inspiratory effort.   . Abdomen: Soft nontender nondistended with normal bowel sounds x4 quadrants. . Musculoskeletal: No lower extremity edema.  2 out of 4 pulses in all 4 extremities. . Skin: No ulcerative lesions noted or rashes . Psychiatry: Mood is appropriate for condition and setting   Data Reviewed: CBC: Recent Labs  Lab 07/06/18 0058 07/07/18 0457 07/08/18 0449 07/09/18 0344 07/11/18 0231 07/12/18 0339  WBC 8.0  8.1 6.5 8.2 13.5* 8.8  NEUTROABS 6.3  --  4.4  --  12.4* 6.1  HGB 7.9* 7.6* 6.5* 10.9* 11.2* 11.7*  HCT 25.0* 24.3* 21.0* 32.5* 35.5* 36.4  MCV 88.3 89.3 90.5 89.3 90.1 91.2  PLT 312 303 269 287  309 517   Basic Metabolic Panel: Recent Labs  Lab 07/06/18 1643 07/08/18 0449 07/09/18 0344 07/11/18 0231 07/12/18 0339  NA 134* 133* 134* 137 140  K 4.2 3.9 4.3 3.8 3.1*  CL 98 98 100 100 99  CO2 27 27 21* 27 30  GLUCOSE 134* 87 126* 138* 89  BUN 9 12 12 20 16   CREATININE 1.07* 0.89 0.76 0.89 0.77  CALCIUM 8.4* 8.5* 8.8* 8.9 9.0  MG  --   --   --   --  2.1  PHOS  --   --   --   --  3.2   GFR: Estimated Creatinine Clearance: 45.7 mL/min (by C-G formula based on SCr of 0.77 mg/dL). Liver Function Tests: Recent Labs  Lab 07/06/18 0644  AST 17  ALT 14  ALKPHOS 51  BILITOT 0.4  PROT 5.4*  ALBUMIN 2.7*   No results for input(s): LIPASE, AMYLASE in the last 168 hours. No results for input(s): AMMONIA in the last 168 hours. Coagulation Profile: No results for input(s): INR, PROTIME in the last 168 hours. Cardiac Enzymes: Recent Labs  Lab 07/06/18 0644 07/06/18 1425 07/06/18 1643  TROPONINI <0.03 <0.03 <0.03   BNP (last 3 results) No results for input(s): PROBNP in the last 8760 hours. HbA1C: No results for input(s): HGBA1C in the last 72 hours. CBG: No results for input(s): GLUCAP in the last 168 hours. Lipid Profile: No results for input(s): CHOL, HDL, LDLCALC, TRIG, CHOLHDL, LDLDIRECT in the last 72 hours. Thyroid Function Tests: No results for input(s): TSH, T4TOTAL, FREET4, T3FREE, THYROIDAB in the last 72 hours. Anemia Panel: No results for input(s): VITAMINB12, FOLATE, FERRITIN, TIBC, IRON, RETICCTPCT in the last 72 hours. Urine analysis:    Component Value Date/Time   COLORURINE YELLOW 03/27/2012 1119   APPEARANCEUR CLEAR 03/27/2012 1119   LABSPEC 1.024 03/27/2012 1119   PHURINE 5.5 03/27/2012 1119   GLUCOSEU NEGATIVE 03/27/2012 1119   HGBUR NEGATIVE 03/27/2012 1119   BILIRUBINUR NEGATIVE 03/27/2012 1119   KETONESUR 15 (A) 03/27/2012 1119   PROTEINUR NEGATIVE 03/27/2012 1119   UROBILINOGEN 1.0 03/27/2012 1119   NITRITE NEGATIVE 03/27/2012 1119    LEUKOCYTESUR MODERATE (A) 03/27/2012 1119   Sepsis Labs: @LABRCNTIP (procalcitonin:4,lacticidven:4)  ) Recent Results (from the past 240 hour(s))  Gram stain     Status: None   Collection Time: 07/08/18  4:24 PM  Result Value Ref Range Status   Specimen Description PLEURAL L  Final   Special Requests NONE  Final   Gram Stain   Final    WBC PRESENT,BOTH PMN AND MONONUCLEAR NO ORGANISMS SEEN CYTOSPIN SMEAR Performed at [facility], [address], [place], [place] [address]    Report Status 07/08/2018 FINAL  Final      Studies: No results found.  Scheduled Meds: . aspirin  81 mg Oral Daily  . azithromycin  250 mg Oral Daily  . calcium citrate  1,200 mg of elemental calcium Oral Daily  . clopidogrel  75 mg Oral Daily  . enoxaparin (LOVENOX) injection  40 mg Subcutaneous Q24H  . feeding supplement (ENSURE ENLIVE)  237 mL Oral QHS  . ferrous sulfate  325 mg Oral BID WC  . furosemide  40 mg Intravenous Daily  . [START ON 07/13/2018] Influenza vac split quadrivalent PF  0.5 mL Intramuscular Tomorrow-1000  . ipratropium-albuterol  3 mL Nebulization Q6H  . levothyroxine  88 mcg Oral Q24H  . methylPREDNISolone (SOLU-MEDROL) injection  60 mg Intravenous Daily  . multivitamin with minerals  1 tablet Oral Daily  . pantoprazole  40 mg Oral Daily  . potassium chloride  40 mEq Oral BID  . rosuvastatin  10 mg Oral Daily  . tamoxifen  20 mg Oral Daily  . umeclidinium-vilanterol  1 puff Inhalation Daily    Continuous Infusions:   LOS: 6 days     Kendra Memos, MD [facility] Pager [phone]  If 7PM-7AM, please contact night-coverage [email or web address] Password [facility] 07/12/2018, 3:02 PM

## 2018-07-12 NOTE — Plan of Care (Signed)
  Problem: Activity: Goal: Risk for activity intolerance will decrease Outcome: Progressing   Problem: Nutrition: Goal: Adequate nutrition will be maintained Outcome: Progressing   Problem: Coping: Goal: Level of anxiety will decrease Outcome: Progressing   Problem: Elimination: Goal: Will not experience complications related to urinary retention Outcome: Progressing   Problem: Pain Managment: Goal: General experience of comfort will improve Outcome: Progressing   Problem: Safety: Goal: Ability to remain free from injury will improve Outcome: Progressing   Problem: Skin Integrity: Goal: Risk for impaired skin integrity will decrease Outcome: Progressing   Problem: Activity: Goal: Capacity to carry out activities will improve Outcome: Progressing

## 2018-07-13 ENCOUNTER — Inpatient Hospital Stay (HOSPITAL_COMMUNITY): Payer: Medicare Other

## 2018-07-13 DIAGNOSIS — J441 Chronic obstructive pulmonary disease with (acute) exacerbation: Secondary | ICD-10-CM

## 2018-07-13 DIAGNOSIS — R918 Other nonspecific abnormal finding of lung field: Secondary | ICD-10-CM

## 2018-07-13 LAB — BASIC METABOLIC PANEL
Anion gap: 10 (ref 5–15)
BUN: 25 mg/dL — ABNORMAL HIGH (ref 8–23)
CO2: 28 mmol/L (ref 22–32)
Calcium: 8.9 mg/dL (ref 8.9–10.3)
Chloride: 102 mmol/L (ref 98–111)
Creatinine, Ser: 0.7 mg/dL (ref 0.44–1.00)
GFR calc Af Amer: >60 mL/min (ref >60)
GFR calc non Af Amer: >60 mL/min (ref >60)
Glucose, Bld: 86 mg/dL (ref 70–99)
Potassium: 4.6 mmol/L (ref 3.5–5.1)
Sodium: 140 mmol/L (ref 135–145)

## 2018-07-13 LAB — CBC WITH DIFFERENTIAL/PLATELET
Abs Immature Granulocytes: 0.02 10*3/uL (ref 0.00–0.07)
Basophils Absolute: 0 10*3/uL (ref 0.0–0.1)
Basophils Relative: 0 %
Eosinophils Absolute: 0 10*3/uL (ref 0.0–0.5)
Eosinophils Relative: 0 %
HCT: 36.2 % (ref 36.0–46.0)
Hemoglobin: 11.3 g/dL — ABNORMAL LOW (ref 12.0–15.0)
Immature Granulocytes: 0 %
Lymphocytes Relative: 15 %
Lymphs Abs: 1.3 10*3/uL (ref 0.7–4.0)
MCH: 28.4 pg (ref 26.0–34.0)
MCHC: 31.2 g/dL (ref 30.0–36.0)
MCV: 91 fL (ref 80.0–100.0)
Monocytes Absolute: 1.3 10*3/uL — ABNORMAL HIGH (ref 0.1–1.0)
Monocytes Relative: 16 %
Neutro Abs: 5.8 10*3/uL (ref 1.7–7.7)
Neutrophils Relative %: 69 %
Platelets: 308 10*3/uL (ref 150–400)
RBC: 3.98 MIL/uL (ref 3.87–5.11)
RDW: 16.4 % — ABNORMAL HIGH (ref 11.5–15.5)
WBC: 8.4 10*3/uL (ref 4.0–10.5)
nRBC: 0 % (ref 0.0–0.2)

## 2018-07-13 MED ORDER — FERROUS SULFATE 325 (65 FE) MG PO TABS: 325.0000 mg | ORAL_TABLET | Freq: Two times a day (BID) | ORAL | 0 refills | Status: DC

## 2018-07-13 MED ORDER — PREDNISONE 20 MG PO TABS: 20.0000 mg | ORAL_TABLET | Freq: Every day | ORAL | 0 refills | Status: DC

## 2018-07-13 MED ORDER — PANTOPRAZOLE SODIUM 40 MG PO TBEC: 40.0000 mg | DELAYED_RELEASE_TABLET | Freq: Every day | ORAL | 0 refills | Status: AC

## 2018-07-13 MED ORDER — FUROSEMIDE 20 MG PO TABS: 20.0000 mg | ORAL_TABLET | Freq: Every day | ORAL | 0 refills | Status: DC

## 2018-07-13 MED ORDER — PREDNISONE 20 MG PO TABS: 20.0000 mg | ORAL_TABLET | Freq: Every day | ORAL | Status: DC

## 2018-07-13 NOTE — Discharge Instructions (Signed)
Anemia  Anemia is a condition in which you do not have enough red blood cells or hemoglobin. Hemoglobin is a substance in red blood cells that carries oxygen. When you do not have enough red blood cells or hemoglobin (are anemic), your body cannot get enough oxygen and your organs may not work properly. As a result, you may feel very tired or have other problems. What are the causes? Common causes of anemia include:  Excessive bleeding. Anemia can be caused by excessive bleeding inside or outside the body, including bleeding from the intestine or from periods in women.  Poor nutrition.  Long-lasting (chronic) kidney, thyroid, and liver disease.  Bone marrow disorders.  Cancer and treatments for cancer.  HIV (human immunodeficiency virus) and AIDS (acquired immunodeficiency syndrome).  Treatments for HIV and AIDS.  Spleen problems.  Blood disorders.  Infections, medicines, and autoimmune disorders that destroy red blood cells. What are the signs or symptoms? Symptoms of this condition include:  Minor weakness.  Dizziness.  Headache.  Feeling heartbeats that are irregular or faster than normal (palpitations).  Shortness of breath, especially with exercise.  Paleness.  Cold sensitivity.  Indigestion.  Nausea.  Difficulty sleeping.  Difficulty concentrating. Symptoms may occur suddenly or develop slowly. If your anemia is mild, you may not have symptoms. How is this diagnosed? This condition is diagnosed based on:  Blood tests.  Your medical history.  A physical exam.  Bone marrow biopsy. Your health care provider may also check your stool (feces) for blood and may do additional testing to look for the cause of your bleeding. You may also have other tests, including:  Imaging tests, such as a CT scan or MRI.  Endoscopy.  Colonoscopy. How is this treated? Treatment for this condition depends on the cause. If you continue to lose a lot of blood, you may  need to be treated at a hospital. Treatment may include:  Taking supplements of iron, vitamin M08, or folic acid.  Taking a hormone medicine (erythropoietin) that can help to stimulate red blood cell growth.  Having a blood transfusion. This may be needed if you lose a lot of blood.  Making changes to your diet.  Having surgery to remove your spleen. Follow these instructions at home:  Take over-the-counter and prescription medicines only as told by your health care provider.  Take supplements only as told by your health care provider.  Follow any diet instructions that you were given.  Keep all follow-up visits as told by your health care provider. This is important. Contact a health care provider if:  You develop new bleeding anywhere in the body. Get help right away if:  You are very weak.  You are short of breath.  You have pain in your abdomen or chest.  You are dizzy or feel faint.  You have trouble concentrating.  You have bloody or black, tarry stools.  You vomit repeatedly or you vomit up blood. Summary  Anemia is a condition in which you do not have enough red blood cells or enough of a substance in your red blood cells that carries oxygen (hemoglobin).  Symptoms may occur suddenly or develop slowly.  If your anemia is mild, you may not have symptoms.  This condition is diagnosed with blood tests as well as a medical history and physical exam. Other tests may be needed.  Treatment for this condition depends on the cause of the anemia. This information is not intended to replace advice given to you by  your health care provider. Make sure you discuss any questions you have with your health care provider. Document Released: 08/15/2004 Document Revised: 08/09/2016 Document Reviewed: 08/09/2016 Elsevier Interactive Patient Education  2019 [place].   Blood Transfusion, Adult A blood transfusion is a procedure in which you are given blood through an IV  tube. You may need this procedure because of:  Illness.  Surgery.  Injury. The blood may come from someone else (a donor). You may also be able to donate blood for yourself (autologous blood donation). The blood given in a transfusion is made up of different types of cells. You may get:  Red blood cells. These carry oxygen to the cells in the body.  White blood cells. These help you fight infections.  Platelets. These help your blood to clot.  Plasma. This is the liquid part of your blood. It helps with fluid imbalances. If you have a clotting disorder, you may also get other types of blood products. What happens before the procedure?  You will have a blood test to find out your blood type. The test also finds out what type of blood your body will accept and matches it to the donor type.  If you are going to have a planned surgery, you may be able to donate your own blood. This may be done in case you need a transfusion.  If you have had an allergic reaction to a transfusion in the past, you may be given medicine to help prevent a reaction. This medicine may be given to you by mouth or through an IV.  You will have your temperature, blood pressure, and pulse checked.  Follow instructions from your doctor about what you cannot eat or drink.  Ask your doctor about: ? Changing or stopping your regular medicines. This is important if you take diabetes medicines or blood thinners. ? Taking medicines such as aspirin and ibuprofen. These medicines can thin your blood. Do not take these medicines before your procedure if your doctor tells you not to. What happens during the procedure?  An IV tube will be put into one of your veins.  The bag of donated blood will be attached to your IV tube. Then, the blood will enter through your vein.  Your temperature, blood pressure, and pulse will be checked regularly during the procedure. This is done to find early signs of a transfusion  reaction.  If you have any signs or symptoms of a reaction, your transfusion will be stopped. You may also be given medicine.  When the transfusion is done, your IV tube will be taken out.  Pressure may be applied to the IV site for a few minutes.  A bandage (dressing) will be put on the IV site. The procedure may vary among doctors and hospitals. What happens after the procedure?  Your temperature, blood pressure, heart rate, breathing rate, and blood oxygen level will be checked often.  Your blood may be tested to see how you are responding to the transfusion.  You may be warmed with fluids or blankets. This is done to keep the temperature of your body normal. Summary  A blood transfusion is a procedure in which you are given blood through an IV tube.  The blood may come from someone else (a donor). You may also be able to donate blood for yourself.  If you have had an allergic reaction to a transfusion in the past, you may be given medicine to help prevent a reaction. This medicine  may be given to you by mouth or through an IV tube.  Your temperature, blood pressure, heart rate, breathing rate, and blood oxygen level will be checked often.  Your blood may be tested to see how you are responding to the transfusion. This information is not intended to replace advice given to you by your health care provider. Make sure you discuss any questions you have with your health care provider. Document Released: 10/04/2008 Document Revised: 03/01/2016 Document Reviewed: 03/01/2016 Elsevier Interactive Patient Education  2019 [place].   Hyponatremia  Hyponatremia is when the amount of salt (sodium) in your blood is too low. When salt levels are low, your cells absorb extra water and they swell. The swelling happens throughout the body, but it mostly affects the brain. Follow these instructions at home:  Take medicines only as told by your doctor. Many medicines can make this  condition worse. Talk with your doctor about any medicines that you are currently taking.  Carefully follow a recommended diet as told by your doctor.  Carefully follow instructions from your doctor about fluid restrictions.  Keep all follow-up visits as told by your doctor. This is important.  Do not drink alcohol. Contact a doctor if:  You feel sicker to your stomach (nauseous).  You feel more confused.  You feel more tired (fatigued).  Your headache gets worse.  You feel weaker.  Your symptoms go away and then they come back.  You have trouble following the diet instructions. Get help right away if:  You start to twitch and shake (have a seizure).  You pass out (faint).  You keep having watery poop (diarrhea).  You keep throwing up (vomiting). This information is not intended to replace advice given to you by your health care provider. Make sure you discuss any questions you have with your health care provider. Document Released: 03/20/2011 Document Revised: 12/14/2015 Document Reviewed: 07/04/2014 Elsevier Interactive Patient Education  2019 [employer]   Pulmonary Edema Pulmonary edema is unusual buildup of fluid in the lungs. It makes it hard for a person to breathe. This condition is an emergency. Follow these instructions at home: Medicines  Take over-the-counter and prescription medicines only as told by your doctor.  If you were prescribed an antibiotic medicine, take it as told by your doctor. Do not stop taking the antibiotic even if you start to feel better.  Ask your doctor to help you write a plan with information about each medicine you take. This may include: ? Why you are taking it. ? Possible side effects. ? Best time of day to take it. ? Foods to take with it, or foods to avoid. ? When to stop taking it.  Make a list of each medicine, vitamin, or herbal supplement you take. ? Keep the list with you at all times. ? Show the list to your  doctor at each visit and before starting a new medicine. ? Keep the list up to date. Lifestyle   Do regular exercise as told by your doctor. It is important to do it safely. You can do this by: ? Asking your doctor what exercises and activities are good and safe for you to do. ? Pacing your activities. This will prevent shortness of breath or chest pain. ? Resting for at least 1 hour before and after meals. ? Asking about cardiac rehabilitation programs. These may include: ? Education. ? Exercise plans. ? Counseling.  Eat a heart-healthy diet that is low in salt, saturated fat,  and cholesterol. Your doctor may suggest foods that are high in fiber, such as: ? Fresh fruits and vegetables. ? Whole grains. ? Beans.  Do not use any products that contain nicotine or tobacco, such as cigarettes and e-cigarettes. If you need help quitting, ask your doctor. General instructions  Keep a record of your weight. ? Record your hospital or clinic weight. When you get home, compare it to your scale and record your weight. ? Weigh yourself first thing in the morning each day, and record the weights. You should weigh yourself every morning after you pee and before you eat breakfast. Wear the same amount of clothing each time you weigh yourself. ? Share your weight record with your doctor. These can help your doctor see if your body is holding extra fluid. ? Tell your doctor right away if you have gained weight quickly, or if you have gained weight as told by your doctor. Your medicines may need to be adjusted.  Check your blood pressure as often as told by your doctor. ? Buy a home blood pressure cuff at your drugstore. ? Record your blood pressure readings. Bring them with you for your clinic visits.  Stay at a healthy weight. Ask your doctor what weight is healthy for you.  Think about doing therapy or being a part of a support group.  Keep all follow-up visits as told by your doctor. This is  important. Contact a doctor if:  You have questions about your medicines.  You miss a dose of your medicine. Get help right away if:  You have very bad chest pain, especially if the pain is crushing or pressure-like and spreads to the arms, back, neck, or jaw.  You have more swelling in your hands, feet, ankles, or belly (abdomen).  You feel sick to your stomach (nauseous).  You have strange sweating.  Your skin turns blue or pale.  Your shortness of breath gets worse.  You feel dizzy or unsteady.  Your vision is blurry.  You have a headache.  You cough up bloody split.  You cannot sleep because it is hard to breathe.  You start to feel a jumping or fluttering sensation (palpitations) in the chest that is unusual for you.  You feel like you cannot get enough air.  You gain weight rapidly. These symptoms may be an emergency. Do not wait to see if the symptoms will go away. Get medical help right away. Call your local emergency services (911 in the [place] Do not drive yourself to the hospital. Summary  Pulmonary edema is unusual fluid buildup in the lungs that can make it hard to breathe.  This condition is an emergency.  Keep a record of your weight. Call your doctor if you gain weight rapidly. This information is not intended to replace advice given to you by your health care provider. Make sure you discuss any questions you have with your health care provider. Document Released: 06/26/2009 Document Revised: 10/08/2016 Document Reviewed: 10/08/2016 Elsevier Interactive Patient Education  2019 [place].   Hypoxemia Hypoxemia occurs when the blood does not contain enough oxygen. The body cannot work well when it does not have enough oxygen because every part of the body needs oxygen. Oxygen enters the lungs when we breathe in, then it travels to all parts of the body through the blood. Hypoxemia can develop suddenly or slowly. What are the causes? Common  causes of this condition include:  Long-term (chronic) lung diseases, such as chronic obstructive  pulmonary disease (COPD) or interstitial lung disease.  Disorders that affect breathing at night, such as sleep apnea.  Fluid buildup in the lungs (pulmonary edema).  Lung infection (pneumonia).  Lung or throat cancer.  Abnormal blood flow that bypasses the lungs (having a shunt).  Certain diseasesthat affect nerves or muscles.  A collapsed lung (pneumothorax).  A blood clot in the lungs (pulmonary embolus).  Certain types of heart disease.  Slow or shallow breathing (hypoventilation).  Certain medicines.  High altitudes.  Toxic chemicals, smoke, and gases. What are the signs or symptoms? In some cases, there may be no symptoms of this condition. If you do have symptoms, they may include:  Shortness of breath (dyspnea).  Bluish color of the skin, lips, or nail beds.  Breathing that is fast, noisy, or shallow.  A fast heartbeat.  Feeling tired or sleepy.  Feeling confused or worried. If hypoxemia develops quickly, you will likely have dyspnea. If hypoxemia develops slowly over months or years, you may not notice any symptoms. How is this diagnosed? This condition is diagnosed by:  A physical exam.  Blood tests.  A test that measures the percentage of oxygen in your blood (pulse oximetry). This is done with a sensor that is placed on your finger, toe, or earlobe. How is this treated?  Treatment for this condition depends on the underlying cause of your hypoxemia. You will likely be treated with oxygen therapy to restore your blood oxygen level. Depending on the cause of your hypoxemia, you may need oxygen therapy for a short time (weeks or months), or you may need it for the rest of your life. Your health care provider may also recommend other therapies to treat the underlying cause of your hypoxemia. Follow these instructions at home:   Take over-the-counter and  prescription medicines only as told by your health care provider.  If you are on oxygen therapy, follow oxygen safety precautions as directed by your health care provider. These may include: ? Always having a backup supply of oxygen. ? Not allowing anyone to smoke or have a fire around oxygen. ? Handling oxygen tanks carefully and as instructed.  Do not use any products that contain nicotine or tobacco, such as cigarettes and e-cigarettes. If you need help quitting, ask your health care provider. Stay away from people who smoke.  Keep all follow-up visits as told by your health care provider. This is important. Contact a health care provider if:  You have any concerns about your oxygen therapy.  You have trouble breathing, even during or after treatment.  You become short of breath when you exercise.  You are tired when you wake up.  You have a headache when you wake up. Get help right away if:  Your shortness of breath gets worse, especially with normal or minimal activity.  You have a bluish color of the skin, lips, or nail beds.  You become confused or you cannot think properly.  You cough up dark mucus or blood.  You have chest pain.  You have a fever. Summary  Hypoxemia occurs when the blood does not contain enough oxygen.  Hypoxemia may or may not cause symptoms. Often, the main symptom is shortness of breath (dyspnea).  Depending on the cause of your hypoxemia, you may need oxygen therapy for a short time (weeks or months), or you may need it for the rest of your life.  If you are on oxygen therapy, follow oxygen safety precautions as directed by your  health care provider. This information is not intended to replace advice given to you by your health care provider. Make sure you discuss any questions you have with your health care provider. Document Released: 01/21/2011 Document Revised: 06/11/2016 Document Reviewed: 06/11/2016 Elsevier Interactive Patient  Education  2019 [employer].   Shortness of Breath, Adult Shortness of breath means you have trouble breathing. Shortness of breath could be a sign of a medical problem. Follow these instructions at home:   Watch for any changes in your symptoms.  Do not use any products that contain nicotine or tobacco, such as cigarettes, e-cigarettes, and chewing tobacco.  Do not smoke. Smoking can cause shortness of breath. If you need help to quit smoking, ask your doctor.  Avoid things that can make it harder to breathe, such as: ? Mold. ? Dust. ? Air pollution. ? Chemical smells. ? Things that can cause allergy symptoms (allergens), if you have allergies.  Keep your living space clean. Use products that help remove mold and dust.  Rest as needed. Slowly return to your normal activities.  Take over-the-counter and prescription medicines only as told by your doctor. This includes oxygen therapy and inhaled medicines.  Keep all follow-up visits as told by your doctor. This is important. Contact a doctor if:  Your condition does not get better as soon as expected.  You have a hard time doing your normal activities, even after you rest.  You have new symptoms. Get help right away if:  Your shortness of breath gets worse.  You have trouble breathing when you are resting.  You feel light-headed or you pass out (faint).  You have a cough that is not helped by medicines.  You cough up blood.  You have pain with breathing.  You have pain in your chest, arms, shoulders, or belly (abdomen).  You have a fever.  You cannot walk up stairs.  You cannot exercise the way you normally do. These symptoms may represent a serious problem that is an emergency. Do not wait to see if the symptoms will go away. Get medical help right away. Call your local emergency services (911 in the [place] Do not drive yourself to the hospital. Summary  Shortness of breath is when you have trouble  breathing enough air. It can be a sign of a medical problem.  Avoid things that make it hard for you to breathe, such as smoking, pollution, mold, and dust.  Watch for any changes in your symptoms. Contact your doctor if you do not get better or you get worse. This information is not intended to replace advice given to you by your health care provider. Make sure you discuss any questions you have with your health care provider. Document Released: 12/25/2007 Document Revised: 12/08/2017 Document Reviewed: 12/08/2017 Elsevier Interactive Patient Education  2019 [employer].

## 2018-07-13 NOTE — Progress Notes (Signed)
SATURATION QUALIFICATIONS: (This note is used to comply with regulatory documentation for home oxygen)  Patient Saturations on Room Air at Rest = 86%  Patient Saturations on Room Air while Ambulating = 80%  Patient Saturations on 4 Liters of oxygen while Ambulating = 90%  Please briefly explain why patient needs home oxygen: patient rapidly desats when O2 [place] is removed.

## 2018-07-13 NOTE — Care Management Note (Signed)
Case Management Note Marvetta Gibbons RN, BSN Transitions of Care Unit 4E- RN Case Manager [phone]  Patient Details  Name: SIREEN HALK MRN: [id number] Date of Birth: [birth year]  Subjective/Objective:   Pt admitted with acute resp. failure                 Action/Plan: PTA pt lived at home, orders placed for home 02 DME and HHRN/aide/RT. - home 02 has been arranged with [facility]- portable tanks have been delivered to the bedside- CM spoke with pt regarding HH needs- list provided per CMS gov. Site with star ratings for choice-  however pt is declining HH services at this time- pt is request transportation assistance- CM has notified CSW for cab voucher.   Expected Discharge Date:  07/13/18               Expected Discharge Plan:  Home/Self Care  In-House Referral:  NA  Discharge planning Services  CM Consult  Post Acute Care Choice:  Durable Medical Equipment, Home Health Choice offered to:  Patient  DME Arranged:  Oxygen DME Agency:  [place] Arranged:  RN, PT, Nurse's Aide, Patient Refused [place] Agency:     Status of Service:  Completed, signed off  If discussed at [place] of Stay Meetings, dates discussed:    Discharge Disposition: home/self care   Additional Comments:  Dawayne Patricia, RN 07/13/2018, 3:54 PM

## 2018-07-13 NOTE — Progress Notes (Signed)
NAME:  Kendra Watts, MRN:  [id number], DOB:  05-09-1946, LOS: 7 ADMISSION DATE:  07/06/2018, CONSULTATION DATE:  07/11/2018 REFERRING MD:  Dr. Nevada Crane, [facility], CHIEF COMPLAINT:  Lung nodule   Brief History   72 yo female smoker presented with several days of dyspnea, leg swelling and hypoxia from COPD exacerbation.  Past Medical History  Renal artery stenosis, Hypothyroidism, HTN, HLD, Breast cancer 2017, Anemia  Consults:  Oncology 12/20   Significant Diagnostic Tests:  PFT 05/14/17 >> FEV1 0.98 (52%), FEV1% 49, DLCO 25% Echo 07/06/18 >> EF 60 to 65% CT chest 07/06/18 >> small b/l effusions, centrilobular and paraseptal emphysema, bronchial wall thickening, 2.7 cm nodular consolidation LUL with surrounding GGO, 1.8 cm spiculated nodule RUL (reviewed by me) Lt thoracentesis 07/08/18 >> 550 ml, glucose 203, LDH 49, WBC 171 (53% M, 38% N, 9% L), cytology with reactive mesothelial cells  Micro Data:  Lt pleural fluid 12/18 >> negative  Antimicrobials:  Zithromax 12/19 >> 12/23  Interim history/subjective:  Breathing better.  Not having cough or chest pain.  Decreased leg swelling.  Objective   Blood pressure 118/73, pulse 79, temperature 98.2 F (36.8 C), temperature source Oral, resp. rate 18, height 5' (1.524 m), weight 48 kg, SpO2 95 %.    FiO2 (%):  [40 %] 40 %   Intake/Output Summary (Last 24 hours) at 07/13/2018 1110 Last data filed at 07/13/2018 4650 Gross per 24 hour  Intake 1200 ml  Output 400 ml  Net 800 ml   Filed Weights   07/11/18 0628 07/12/18 0500 07/13/18 0443  Weight: 49.2 kg 48.1 kg 48 kg    Examination:  General - alert Eyes - pupils reactive ENT - no sinus tenderness, no stridor Cardiac - regular rate/rhythm, no murmur Chest - decreased BS, no wheeze Abdomen - soft, non tender Extremities - no cyanosis, clubbing, or edema Skin - no rashes Neuro - normal strength, moves extremities, follows commands Psych - normal mood and  behavior   Assessment & Plan:   COPD exacerbation. COPD with emphysema. Plan - transition back to anoro - prn albuterol - wean off steroids - completed ABx 12/23  Lung nodules in RUL and LUL. Plan - will need assessment for navigational bronchoscopy as outpt  Acute on chronic respiratory failure with hypoxia. Plan - primary team arranging for home oxygen set up  Tobacco abuse. Plan - needs to quit smoking  Disposition:  She has been scheduled for pulmonary office follow up with Lazaro Arms, NP on 07/20/18 at 10 am.  She will then need f/u with Dr. Lamonte Sakai or Dr. Valeta Harms to arrange for ENB.   Labs    CMP Latest Ref Rng & Units 07/13/2018 07/12/2018 07/11/2018  Glucose 70 - 99 mg/dL 86 89 138(H)  BUN 8 - 23 mg/dL 25(H) 16 20  Creatinine 0.44 - 1.00 mg/dL 0.70 0.77 0.89  Sodium 135 - 145 mmol/L 140 140 137  Potassium 3.5 - 5.1 mmol/L 4.6 3.1(L) 3.8  Chloride 98 - 111 mmol/L 102 99 100  CO2 22 - 32 mmol/L 28 30 27   Calcium 8.9 - 10.3 mg/dL 8.9 9.0 8.9  Total Protein 6.5 - 8.1 g/dL - - -  Total Bilirubin 0.3 - 1.2 mg/dL - - -  Alkaline Phos 38 - 126 U/L - - -  AST 15 - 41 U/L - - -  ALT 0 - 44 U/L - - -   CBC Latest Ref Rng & Units 07/13/2018 07/12/2018 07/11/2018  WBC 4.0 -  10.5 K/uL 8.4 8.8 13.5(H)  Hemoglobin 12.0 - 15.0 g/dL 11.3(L) 11.7(L) 11.2(L)  Hematocrit 36.0 - 46.0 % 36.2 36.4 35.5(L)  Platelets 150 - 400 K/uL 308 334 309   ABG    Component Value Date/Time   PHART 7.424 07/10/2018 0811   PCO2ART 44.1 07/10/2018 0811   PO2ART 47.6 (L) 07/10/2018 0811   HCO3 28.5 (H) 07/10/2018 0811   O2SAT 82.8 07/10/2018 4917    Chesley Mires, MD [facility] 07/13/2018, 11:53 AM

## 2018-07-13 NOTE — Discharge Summary (Addendum)
Discharge Summary  Kendra Watts AST:[id number] DOB: 07/19/1946  PCP: Merrilee Seashore, MD  Admit date: 07/06/2018 Discharge date: 07/13/2018  Time spent: 35 minutes  Recommendations for Outpatient Follow-up:  1. Follow-up with pulmonology 2. Follow up with PCP 3. Take your medications as prescribed  Discharge Diagnoses:  Active Hospital Problems   Diagnosis Date Noted  . Acute respiratory failure with hypoxia ([facility]) 07/06/2018  . Acute CHF (congestive heart failure) ([facility]) 07/06/2018  . Hyponatremia 07/06/2018  . Anemia 07/06/2018  . COPD with acute bronchitis ([place]) 07/06/2018  . Breast cancer of upper-outer quadrant of left female breast ([name]) 09/08/2015  . Peripheral arterial disease ([facility]) 05/26/2013  . Essential hypertension 05/26/2013    Resolved Hospital Problems  No resolved problems to display.    Discharge Condition: Stable  Diet recommendation: Resume previous diet  Vitals:   07/13/18 0855 07/13/18 1346  BP:    Pulse:    Resp:    Temp:    SpO2: 95% 96%    History of present illness:  Kendra Watts a 72 y.o.femalewithhistory of COPD, hypothyroidism, hyperlipidemia, hypertension has been experiencing shortness of breath for the last 2 weeks. Shortness of breath is mostly exertional. And also noticed bilateral lower extremity edema. Denies any chest pain. Had gone to her PCP and was found to have low sodium and also hemoglobin which has been worked up. Since patient shortness of breath getting worse patient came to the ER. Patient not noticed any blood in the stools.Admitted for acute hypoxic respiratory failure and hypervolemic hyponatremia.  Hospital course complicated by persistent dyspnea at rest.  CT angio PE ordered due to elevated d-dimer 2.86.  This revealed no pulmonary embolism however there were incidental findings of irregular 2.7 cm nodular focus of consolidation in the posterior left upper lobe with surrounding patchy  groundglass opacity and spiculated 1.8 cm solid apical right upper lobe pulmonary nodule with morphologic suspicious for primary bronchogenic carcinoma Recommendations for follow-up in 1 month  Admits to current tobacco use more than 1+1/2 pack/day for at least 50 years.  Hospital course complicated by symptomatic anemia with hemoglobin of 6.8 and negative FOBT, was transfused 2 unit PRBCs.  Persistent hypoxia and bilateral pleural effusion requiring thoracentesis.  Left thoracentesis completed on 07/08/2018 by interventional radiology with 550 cc fluid removed.  Fluid analysis revealed reactive mesothelial cells.  Hypoxic and initially requiring noninvasive positive pressure mechanical ventilation, BiPAP.  Home O2 evaluation done on 07/13/2018.  Patient requires 4 L of O2 supplementation on ambulation.  07/13/2018: Patient seen and examined at her bedside.  No acute events overnight.  She has no new complaints.  She denies chest pain, dyspnea or palpitations.  On the day of discharge, the patient was hemodynamically stable.  She will need to follow-up with pulmonologist posthospitalization for possible PET scan and possible bronchoscopy.  Patient understands and agrees to plan.    Hospital Course:  Principal Problem:   Acute respiratory failure with hypoxia (HCC) Active Problems:   Peripheral arterial disease (HCC)   Essential hypertension   Breast cancer of upper-outer quadrant of left female breast ([name])   Acute CHF (congestive heart failure) (HCC)   Hyponatremia   Anemia   COPD with acute bronchitis (HCC)  Acute hypoxic respiratory failure secondary to bilateral pleural effusions versus COPD exacerbation versus others Independently reviewed CTA PE which was negative for PE but with pulmonary nodules on left and right lungs.  Recommendations for follow-up in 1 month. Initially requiring 6 L of oxygen  by nasal cannula to maintain O2 saturation greater than 90% ABG done on 07/07/2018  independently reviewed with hypoxia Negative procalcitonin Maintain O2 saturation greater than 92% Continue p.o. Lasix 20 mg daily Home O2 evaluation completed with recommendations for 4 L of oxygen by nasal cannula on ambulation  Bilateral pulmonary nodules with suspicion for primary bronchogenic carcinoma Post left thoracentesis 550 cc fluid removed Fluid analysis revealed reactive mesothelial cells Pulmonology will see outpatient. Plan for PET scan first then navigational directed bronchoscopy Appreciate pulmonology involvement  Pulmonary edema with unclear etiology Does not suspect cardiogenic or infective in nature independently reviewed cxr done today which revealed increase in pulm vascularity ABG revealed significant hypoxia w PaO2 in the 40's. Recent 2D echo and recent procalcitonin unremarkable Diuretic while maintaining BP with MAP>65 Continue with prednisone 20 mg daily x5 days  Acute COPD exacerbation Continue steroids Continue duo nebs every 6 hours Completed pulmonary toilet with Mucinex 1200 mg twice daily and hypersaline nebs 3 times daily Continue COPD medications Completed Z-Pak x5 days for its anti-inflammatory properties Procalcitonin less than 0.10 on 07/07/2018  Resolved hypokalemia in the setting of use of diuretic post repletion  Symptomatic anemia/iron deficiency anemia Post 2 unit PRBC transfusion Hemoglobin from 6.5 to 10.9  Hg stable at 11.3 from 11.7  No sign of overt bleeding FOBT negative Continue ferrous sulfate 325 mg twice daily Repeat CBC in the morning  Resolved acute on chronic hypervolemic hyponatremia with diuretics Improving sodium 134 Suspect SIADH Presented with sodium of 121 Continue with diuretics IV Lasix 40 mg daily Continue to monitor urine output and fluid status  Resolved AKI Baseline creatinine appears to be 0.67 with GFR greater than 60 Today creatinine is 0.76 from 0.89 with GFR greater than 60 Avoid  nephrotoxic agents/dehydration/hypotension Follow-up with PCP  Bilateral pleural effusion greater on the left 2D echo done on 07/06/2018 was unrevealing with LVEF 60 to 65% with no regional wall motion abnormality Reported normal LVEF 60 to 65% with no regional wall motion abnormalities Post left thoracentesis Repeated chest x-ray done on 07/10/2018 independently reviewed revealed bilateral pleural effusion greater on the right and increase in pulmonary vascularity  Hypertension Blood pressure stable Continue to hold off all antihypertensive medications Maintain map greater than 65  Resolving Leukocytosis, suspect reactive No sign of active infective process On steroids  Severe protein calorie malnutrition Albumin 2.7 and BMI of 18  Hypothyroidism Continue levothyroxine  Iron deficiency anemia/symptomatic anemia Improving Hg 11.7 Continue ferrous sulfate 325 mg twice daily  Hyperlipidemia Continue Crestor  History of peripheral arterial disease Continue aspirin, Plavix and statin  History of breast cancer diagnosed 2 years ago On tamoxifen Curbside with Dr. Marye Round her oncologist He recommends bronchoscopy with biopsy if fluid analysis is negative    Code Status:Full code.  Consults called: Pulmonology    Discharge Exam: BP 118/73 (BP Location: Right Arm)   Pulse 79   Temp 98.2 F (36.8 C) (Oral)   Resp 18   Ht 5' (1.524 m)   Wt 48 kg   SpO2 96%   BMI 20.67 kg/m  . General: 72 y.o. year-old female well developed well nourished in no acute distress.  Alert and oriented x3. . Cardiovascular: Regular rate and rhythm with no rubs or gallops.  No thyromegaly or JVD noted.   Marland Kitchen Respiratory: Mild rales at bases with no wheezes. Good inspiratory effort. . Abdomen: Soft nontender nondistended with normal bowel sounds x4 quadrants. . Musculoskeletal: No lower extremity edema. 2/4 pulses in  all 4 extremities. Marland Kitchen Psychiatry: Mood is appropriate for  condition and setting  Discharge Instructions You were cared for by a hospitalist during your hospital stay. If you have any questions about your discharge medications or the care you received while you were in the hospital after you are discharged, you can call the unit and asked to speak with the hospitalist on call if the hospitalist that took care of you is not available. Once you are discharged, your primary care physician will handle any further medical issues. Please note that NO REFILLS for any discharge medications will be authorized once you are discharged, as it is imperative that you return to your primary care physician (or establish a relationship with a primary care physician if you do not have one) for your aftercare needs so that they can reassess your need for medications and monitor your lab values.   Allergies as of 07/13/2018   No Known Allergies     Medication List    STOP taking these medications   amLODipine 10 MG tablet Commonly known as:  NORVASC   losartan 100 MG tablet Commonly known as:  COZAAR     TAKE these medications   ANORO ELLIPTA 62.5-25 MCG/INH Aepb Generic drug:  umeclidinium-vilanterol Inhale 1 puff into the lungs daily.   aspirin 81 MG tablet Take 81 mg by mouth daily.   calcium citrate 950 MG tablet Commonly known as:  CALCITRATE - dosed in mg elemental calcium Take 1,200 mg of elemental calcium by mouth daily.   CARDURA 8 MG tablet Generic drug:  doxazosin Take 8 mg by mouth daily.   clopidogrel 75 MG tablet Commonly known as:  PLAVIX TAKE 1 TABLET BY MOUTH  DAILY   CRESTOR 10 MG tablet Generic drug:  rosuvastatin Take 10 mg by mouth daily.   ferrous sulfate 325 (65 FE) MG tablet Take 1 tablet (325 mg total) by mouth 2 (two) times daily with a meal.   furosemide 20 MG tablet Commonly known as:  LASIX Take 1 tablet (20 mg total) by mouth daily.   levothyroxine 88 MCG tablet Commonly known as:  SYNTHROID, LEVOTHROID Take 88  mcg by mouth daily before breakfast.   multivitamin with minerals tablet Take 1 tablet by mouth daily.   pantoprazole 40 MG tablet Commonly known as:  PROTONIX Take 1 tablet (40 mg total) by mouth daily. Start taking on:  July 14, 2018   predniSONE 20 MG tablet Commonly known as:  DELTASONE Take 1 tablet (20 mg total) by mouth daily with breakfast.   tamoxifen 20 MG tablet Commonly known as:  NOLVADEX TAKE 1 TABLET BY MOUTH  DAILY            Durable Medical Equipment  (From admission, onward)         Start     Ordered   07/12/18 1747  For home use only DME oxygen  Once    Question Answer Comment  Mode or (Route) Nasal cannula   Liters per Minute 4   Frequency Continuous (stationary and portable oxygen unit needed)   Oxygen conserving device Yes   Oxygen delivery system Gas      07/12/18 1747         No Known Allergies Follow-up Information    [facility] Follow up.   Why:  Home 02 arranged- portable tank to be delivered to room prior to discharge Contact information: [address] [place] [address] [phone]  Fenton Foy, NP Follow up on 07/20/2018.   Specialty:  Pulmonary Disease Why:  Follow up with lung doctors at 10 AM. Contact information: [place] [address] [phone]        Merrilee Seashore, MD. Call in 1 day(s).   Specialty:  Internal Medicine Why:  Please call for post hospital follow up appointment Contact information: [place] [address] [phone]            The results of significant diagnostics from this hospitalization (including imaging, microbiology, ancillary and laboratory) are listed below for reference.    Significant Diagnostic Studies: Dg Chest 1 View  Result Date: 07/08/2018 CLINICAL DATA:  Followup left thoracentesis. EXAM: CHEST  1 VIEW COMPARISON:  07/06/2018 FINDINGS: Mild cardiomegaly. Aortic  atherosclerosis. Worsened interstitial density consistent with worsened edema. Less pleural fluid on the left following thoracentesis. No pneumothorax. Small bilateral effusions persist with basilar atelectasis. IMPRESSION: 1. Less pleural fluid on the left following thoracentesis. No pneumothorax. 2. Worsened interstitial edema. Persistent effusions and basilar atelectasis. Electronically Signed   By: [name] M.D.   On: 07/08/2018 16:28   Dg Chest 2 View  Result Date: 07/06/2018 CLINICAL DATA:  Cough, shortness of breath, peripheral edema. EXAM: CHEST - 2 VIEW COMPARISON:  AP view 07/13/2018 FINDINGS: Bilateral pleural effusions, left greater than right with associated atelectasis. Increased interstitial opacities above baseline consistent with mild pulmonary edema. Unchanged heart size and mediastinal contours with aortic atherosclerosis. Chronic hyperinflation. No pneumothorax. Bones are under mineralized. Postsurgical change of the left breast and axilla. IMPRESSION: Findings consistent with CHF with bilateral pleural effusions and mild pulmonary edema. Electronically Signed   By: Keith Rake M.D.   On: 07/06/2018 01:36   Ct Angio Chest Pe W Or Wo Contrast  Result Date: 07/06/2018 CLINICAL DATA:  Inpatient. Elevated D-dimer. Hypoxia. Current smoker. EXAM: CT ANGIOGRAPHY CHEST WITH CONTRAST TECHNIQUE: Multidetector CT imaging of the chest was performed using the standard protocol during bolus administration of intravenous contrast. Multiplanar CT image reconstructions and MIPs were obtained to evaluate the vascular anatomy. CONTRAST:  170mL ISOVUE-370 IOPAMIDOL (ISOVUE-370) INJECTION 76% COMPARISON:  Chest radiograph from earlier today. FINDINGS: Cardiovascular: The study is high quality for the evaluation of pulmonary embolism. There are no filling defects in the central, lobar, segmental or subsegmental pulmonary artery branches to suggest acute pulmonary embolism. Atherosclerotic  nonaneurysmal thoracic aorta. 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 Dilated main pulmonary artery (3.5 cm diameter). Normal heart size. No significant pericardial fluid/thickening. Three-vessel coronary atherosclerosis. Mediastinum/Nodes: No discrete thyroid nodules. Unremarkable esophagus. No pathologically enlarged axillary, mediastinal or hilar lymph nodes. Lungs/Pleura: No pneumothorax. Small dependent bilateral pleural effusions, left greater than right. Mild centrilobular and paraseptal emphysema with mild diffuse bronchial wall thickening. Irregular 2.7 x 1.5 cm nodular focus of consolidation in the posterior left upper lobe (series 6/image 41) with surrounding patchy ground-glass opacity. Spiculated 1.8 cm solid apical right upper lobe pulmonary nodule (series 6/image 30). Mild-to-moderate compressive atelectasis in the dependent lower lobes, left greater than right. Upper abdomen: Stable asymmetric atrophy of left kidney. Musculoskeletal: No aggressive appearing focal osseous lesions. Mild thoracic spondylosis. Review of the MIP images confirms the above findings. IMPRESSION: 1. No pulmonary embolism. 2. Irregular 2.7 cm nodular focus of consolidation in the posterior left upper lobe with surrounding patchy ground-glass opacity. This finding is indeterminate for infection versus primary  bronchogenic malignancy. Spiculated 1.8 cm solid apical right upper lobe pulmonary nodule, morphologically suspicious for primary bronchogenic carcinoma, although infection is also in the differential for this finding. Management considerations include short-term follow-up chest CT in 1 month following a course of antibiotic therapy versus bronchoscopy versus PET-CT (preferably after a course of antibiotic therapy), as clinically warranted. 3. Small dependent bilateral pleural effusions, left greater than  right, with associated mild-to-moderate compressive atelectasis of the dependent lower lobes. 4. Mild emphysema with mild diffuse bronchial wall thickening, suggesting COPD. 5. Dilated main pulmonary artery, suggesting pulmonary arterial hypertension. 6. Aberrant mildly ectatic right subclavian artery. Aortic Atherosclerosis (ICD10-I70.0) and Emphysema (ICD10-J43.9). Electronically Signed   By: Ilona Sorrel M.D.   On: 07/06/2018 15:21   Dg Chest Port 1 View  Result Date: 07/13/2018 CLINICAL DATA:  Hypoxia EXAM: PORTABLE CHEST 1 VIEW COMPARISON:  07/10/2018 FINDINGS: There is hyperinflation of the lungs compatible with COPD. Cardiomegaly. Small to moderate bilateral effusions with bibasilar atelectasis. Mild vascular congestion and interstitial prominence. Pulmonary edema and bibasilar opacities have improved since prior study. IMPRESSION: Cardiomegaly with vascular congestion and improving interstitial edema. Small to moderate bilateral effusions and bibasilar atelectasis, both improving since prior study. Electronically Signed   By: Rolm Baptise M.D.   On: 07/13/2018 08:02   Dg Chest Port 1 View  Result Date: 07/10/2018 CLINICAL DATA:  Hypoxia, history breast cancer, hypertension, smoker EXAM: PORTABLE CHEST 1 VIEW COMPARISON:  Portable exam 0802 hours compared to 07/08/2018 FINDINGS: Enlargement of cardiac silhouette with vascular congestion. Atherosclerotic calcification aorta. Diffuse interstitial infiltrates compatible with pulmonary edema, increased. Slightly increased bibasilar pleural effusions and atelectasis. No pneumothorax. Bones demineralized. IMPRESSION: Slightly increased pulmonary edema, bibasilar effusions and atelectasis. Electronically Signed   By: Lavonia [name] M.D.   On: 07/10/2018 08:36   Ir Thoracentesis Asp Pleural Space W/img Guide  Result Date: 07/08/2018 INDICATION: Bilateral pleural effusions. CT findings showed a posterior left upper lobe with surrounding patchy ground glass  opacity and spiculated 1.8 cm solid apical right upper lobe pulmonary nodule with morphologic suspicious for primary bronchogenic carcinoma. Request for diagnostic and therapeutic thoracentesis. EXAM: ULTRASOUND GUIDED LEFT THORACENTESIS MEDICATIONS: 1% lidocaine 9 mL COMPLICATIONS: None immediate. PROCEDURE: An ultrasound guided thoracentesis was thoroughly discussed with the patient and questions answered. The benefits, risks, alternatives and complications were also discussed. The patient understands and wishes to proceed with the procedure. Written consent was obtained. Ultrasound was performed to localize and mark an adequate pocket of fluid in the left chest. The area was then prepped and draped in the normal sterile fashion. 1% Lidocaine was used for local anesthesia. Under ultrasound guidance a 6 Fr Safe-T-Centesis catheter was introduced. Thoracentesis was performed. The catheter was removed and a dressing applied. FINDINGS: A total of approximately 550 mL of clear yellow fluid was removed. Samples were sent to the laboratory as requested by the clinical team. IMPRESSION: Successful ultrasound guided left thoracentesis yielding 550 mL of pleural fluid. Read by: Gareth Eagle, PA-C Electronically Signed   By: Jacqulynn Cadet M.D.   On: 07/08/2018 16:15    Microbiology: Recent Results (from the past 240 hour(s))  Gram stain     Status: None   Collection Time: 07/08/18  4:24 PM  Result Value Ref Range Status   Specimen Description PLEURAL L  Final   Special Requests NONE  Final   Gram Stain   Final    WBC PRESENT,BOTH PMN AND MONONUCLEAR NO ORGANISMS SEEN CYTOSPIN SMEAR Performed at [facility],  [address], [place], [place] [address]    Report Status 07/08/2018 FINAL  Final     Labs: Basic Metabolic Panel: Recent Labs  Lab 07/08/18 0449 07/09/18 0344 07/11/18 0231 07/12/18 0339 07/13/18 0319  NA 133* 134* 137 140 140  K 3.9 4.3 3.8 3.1* 4.6  CL 98 100 100 99 102  CO2  27 21* 27 30 28   GLUCOSE 87 126* 138* 89 86  BUN 12 12 20 16  25*  CREATININE 0.89 0.76 0.89 0.77 0.70  CALCIUM 8.5* 8.8* 8.9 9.0 8.9  MG  --   --   --  2.1  --   PHOS  --   --   --  3.2  --    Liver Function Tests: No results for input(s): AST, ALT, ALKPHOS, BILITOT, PROT, ALBUMIN in the last 168 hours. No results for input(s): LIPASE, AMYLASE in the last 168 hours. No results for input(s): AMMONIA in the last 168 hours. CBC: Recent Labs  Lab 07/08/18 0449 07/09/18 0344 07/11/18 0231 07/12/18 0339 07/13/18 0319  WBC 6.5 8.2 13.5* 8.8 8.4  NEUTROABS 4.4  --  12.4* 6.1 5.8  HGB 6.5* 10.9* 11.2* 11.7* 11.3*  HCT 21.0* 32.5* 35.5* 36.4 36.2  MCV 90.5 89.3 90.1 91.2 91.0  PLT 269 287 309 334 308   Cardiac Enzymes: Recent Labs  Lab 07/06/18 1643  TROPONINI <0.03   BNP: BNP (last 3 results) Recent Labs    07/06/18 0058  BNP 363.3*    ProBNP (last 3 results) No results for input(s): PROBNP in the last 8760 hours.  CBG: No results for input(s): GLUCAP in the last 168 hours.     Signed:  Kayleen Memos, MD [facility] 07/13/2018, 3:20 PM

## 2018-07-13 NOTE — Progress Notes (Signed)
Physical Therapy Treatment Patient Details Name: Kendra Watts MRN: [id number] DOB: Jun 17, 1946 Today's Date: 07/13/2018    History of Present Illness Pt is a 72 y.o. F with significant PMH of COPD, hypothyroidism, hypertension who was admitted with acute hypoxic respiratory failure and hypervolemic hyponatremia. CT imaging concerning for bronchogenic carcinoma    PT Comments    Pt reports she just walked with nursing down hall and back. Agreed to exercises with therapy. Pt is progressing well toward goals. Acute PT to continued during pt's hospital stay.   Follow Up Recommendations  No PT follow up;Supervision - Intermittent     Equipment Recommendations  None recommended by PT    Precautions / Restrictions Precautions Precautions: Other (comment) Precaution Comments: O2 Restrictions Weight Bearing Restrictions: No    Mobility  Bed Mobility                  Transfers Overall transfer level: Independent Equipment used: None Transfers: Sit to/from Stand Sit to Stand: Independent         General transfer comment: no cues or assistance needed with sit/stand transfers      Cognition Arousal/Alertness: Awake/alert Behavior During Therapy: WFL for tasks assessed/performed Overall Cognitive Status: Within Functional Limits for tasks assessed                                 General Comments: Very pleasant and eager to participate      Exercises General Exercises - Lower Extremity Long Arc Quad: AROM;Strengthening;Both;10 reps;Seated Hip Flexion/Marching: AROM;Strengthening;Both;10 reps;Standing Toe Raises: AROM;Strengthening;Both;10 reps;Standing Heel Raises: AROM;Strengthening;Both;10 reps;Standing Mini-Sqauts: AROM;Strengthening;Both;10 reps;Standing    General Comments        Pertinent Vitals/Pain Pain Assessment: No/denies pain     PT Goals (current goals can now be found in the care plan section) Acute Rehab PT Goals Patient  Stated Goal: "go on walks with my dog." PT Goal Formulation: With patient Time For Goal Achievement: 07/26/18 Potential to Achieve Goals: Good Progress towards PT goals: Progressing toward goals    Frequency    Min 3X/week      PT Plan Current plan remains appropriate    AM-PAC PT "6 Clicks" Mobility   Outcome Measure  Help needed turning from your back to your side while in a flat bed without using bedrails?: None Help needed moving from lying on your back to sitting on the side of a flat bed without using bedrails?: None Help needed moving to and from a bed to a chair (including a wheelchair)?: None Help needed standing up from a chair using your arms (e.g., wheelchair or bedside chair)?: None Help needed to walk in hospital room?: None Help needed climbing 3-5 steps with a railing? : None 6 Click Score: 24    End of Session Equipment Utilized During Treatment: Oxygen Activity Tolerance: Patient tolerated treatment well Patient left: in chair;with call bell/phone within reach Nurse Communication: Mobility status PT Visit Diagnosis: Muscle weakness (generalized) (M62.81)     Time: 5465-6812 PT Time Calculation (min) (ACUTE ONLY): 10 min  Charges:  $Therapeutic Exercise: 8-22 mins                    Willow Ora, PTA, [facility] Acute Rehab Services Office- [phone] 07/13/18, 11:03 AM   Willow Ora 07/13/2018, 11:02 AM

## 2018-07-13 NOTE — Progress Notes (Signed)
Order received to discharge patient.  Telemetry monitor removed and CCMD notified.  PIV access removed.  Discharge instructions, follow up, medications and instructions for their use discussed with patient. 

## 2018-07-13 NOTE — Care Management Important Message (Signed)
Important Message  Patient Details  Name: Kendra Watts MRN: [id number] Date of Birth: 1945-10-17   Medicare Important Message Given:  Yes    Barb Merino Janes Colegrove 07/13/2018, 3:05 PM

## 2018-07-13 NOTE — Progress Notes (Signed)
Nutrition Follow Up  DOCUMENTATION CODES:   Not applicable  INTERVENTION:    Ensure Enlive po BID, each supplement provides 350 kcal and 20 grams of protein  MVI with minerals daily  NUTRITION DIAGNOSIS:   Increased nutrient needs related to chronic illness(COPD) as evidenced by estimated needs, ongoing  GOAL:   Patient will meet greater than or equal to 90% of their needs, progressing   MONITOR:   PO intake, Supplement acceptance, Labs, Weight trends, Skin, I & O's  ASSESSMENT:   Kendra Watts is a 72 y.o. female with history of COPD, hypothyroidism, hyperlipidemia, hypertension has been experiencing shortness of breath for the last 2 weeks.  Shortness of breath is mostly exertional.  And also noticed bilateral lower extremity edema.  Denies any chest pain.  Had gone to her PCP and was found to have low sodium and also hemoglobin which has been worked up.  Since patient shortness of breath getting worse patient came to the ER.  Patient not noticed any blood in the stools.  Pt admitted with acute respiratory failure related to CHF vs COPD.  PO intake good at 75% per flowsheet records. Drinking Ensure Enlive supplements.  Pt is feeling better, weaning off oxygen. Labs & medications reviewed. Possible D/C today.  Diet Order:   Diet Order            Diet Heart Room service appropriate? Yes; Fluid consistency: Thin  Diet effective now             EDUCATION NEEDS:   Education needs have been addressed  Skin:  Skin Assessment: Skin Integrity Issues: Skin Integrity Issues:: Incisions Incisions: closed lt breast incision  Last BM:  12/19  Height:   Ht Readings from Last 1 Encounters:  07/06/18 5' (1.524 m)   Weight:   Wt Readings from Last 1 Encounters:  07/13/18 48 kg   Ideal Body Weight:  45.5 kg  BMI:  Body mass index is 20.67 kg/m.  Estimated Nutritional Needs:   Kcal:  1400-1600  Protein:  60-75 grams  Fluid:  1.4-1.6 L  Arthur Holms,  RD, LDN Pager #: [phone] After-Hours Pager #: [phone]

## 2018-07-14 ENCOUNTER — Other Ambulatory Visit: Payer: Self-pay

## 2018-07-14 DIAGNOSIS — C3490 Malignant neoplasm of unspecified part of unspecified bronchus or lung: Secondary | ICD-10-CM

## 2018-07-20 ENCOUNTER — Ambulatory Visit (INDEPENDENT_AMBULATORY_CARE_PROVIDER_SITE_OTHER): Payer: Medicare Other | Admitting: Nurse Practitioner

## 2018-07-20 ENCOUNTER — Encounter: Payer: Self-pay | Admitting: Nurse Practitioner

## 2018-07-20 VITALS — BP 110/64 | HR 85 | Ht 60.0 in | Wt 87.8 lb

## 2018-07-20 DIAGNOSIS — R918 Other nonspecific abnormal finding of lung field: Secondary | ICD-10-CM | POA: Insufficient documentation

## 2018-07-20 DIAGNOSIS — J449 Chronic obstructive pulmonary disease, unspecified: Secondary | ICD-10-CM | POA: Diagnosis not present

## 2018-07-20 DIAGNOSIS — Z720 Tobacco use: Secondary | ICD-10-CM

## 2018-07-20 DIAGNOSIS — J9601 Acute respiratory failure with hypoxia: Secondary | ICD-10-CM

## 2018-07-20 NOTE — Patient Instructions (Signed)
Will schedule PET scan - lung nodules May taper off prednisone this week. Continue Anoro Continue O2 as needed to keep Sats above 88% Increase activity as tolerated Continue to work on quitting smoking Follow up with Dr. Lamonte Sakai at his 1st available appointment within 4 weeks or sooner if needed

## 2018-07-20 NOTE — Assessment & Plan Note (Signed)
CT scan in hospital showed a irregular 2.7 cm nodular focus of consolidation in the posterior left upper lobe with surrounding patchy glass opacity spiculated 1.8 cm solid focal right upper lobe pulmonary nodule suspicious for bronchogenic carcinoma.  Was recommended that patient follow-up with bronchoscopy and PET scan for further diagnosis.  Will schedule follow-up with Dr. Lamonte Sakai after PET scan.  Patient Instructions  Will schedule PET scan - lung nodules May taper off prednisone this week. Continue Anoro Continue O2 as needed to keep Sats above 88% Increase activity as tolerated Continue to work on quitting smoking Follow up with Dr. Lamonte Sakai at his 1st available appointment within 4 weeks or sooner if needed

## 2018-07-20 NOTE — Assessment & Plan Note (Signed)
Patient Instructions  Will schedule PET scan - lung nodules May taper off prednisone this week. Continue Anoro Continue O2 as needed to keep Sats above 88% Increase activity as tolerated Continue to work on quitting smoking Follow up with Dr. Lamonte Sakai at his 1st available appointment within 4 weeks or sooner if needed

## 2018-07-20 NOTE — Assessment & Plan Note (Signed)
Please quit smoking before next visit

## 2018-07-20 NOTE — Progress Notes (Signed)
@Patient  ID: Kendra Watts, female    DOB: [birth year], 72 y.o.   MRN: [id number]  Chief Complaint  Patient presents with  . Hospitalization Follow-up    Referring provider: Merrilee Seashore, MD  HPI 72 year old female with history of COPD, recent lung nodules found on CT 07/06/18, respiratory failure with hypoxia. PMH  Includes breast cancer left, CHF, anemia  Tests: PFT 05/14/17 >> FEV1 0.98 (52%), FEV1% 49, DLCO 25% Echo 07/06/18 >> EF 60 to 65% CT chest 07/06/18 >> small b/l effusions, centrilobular and paraseptal emphysema, bronchial wall thickening, 2.7 cm nodular consolidation LUL with surrounding GGO, 1.8 cm spiculated nodule RUL (reviewed by me) Lt thoracentesis 07/08/18 >> 550 ml, glucose 203, LDH 49, WBC 171 (53% M, 38% N, 9% L), cytology with reactive mesothelial cells CXR 07/13/18 >>Cardiomegaly with vascular congestion and improving interstitial edema. Small to moderate bilateral effusions and bibasilar atelectasis, both improving since prior study.  OV 07/20/18 -hospital follow-up 07/06/18 - 07/13/2018 Patient presents for hospital follow-up.  She was admitted to the hospital for acute hypoxic respiratory failure and hypovolemic hyponatremia.  CT angiogram was ordered due to elevated d-dimer and revealed no pulmonary embolism however there was incidental findings of irregular 2.7 cm nodular focus of consolidation in the posterior left upper lobe with surrounding patchy groundglass opacity spiculated 1.8 cm solid focal right upper lobe pulmonary nodule with nephrologic suspicious for bronchogenic carcinoma.  Recommended that she have follow-up with bronchoscopy in around 1 month for further diagnosis.  She was also found to be anemic with hemoglobin of 6.8 was transfused 2 units packed red blood cells.  He had persistent hypoxia and bilateral pleural effusion requiring thoracentesis with 550 cc of fluid removed.  Fluid analysis revealed reactive mesothelial cells.  She  requires 4 L of O2 supplementation along with ambulation.   Patient states that since hospital discharge she has been doing really well.  She checks her O2 sats often and wears her oxygen as needed to keep sats up.  She was 93% on room air in the office today.  Visit she is slowly getting her strength back.  Has not quit smoking but she is trying to cut back.  Is compliant with medications.  She has a follow-up with her PCP follow-up on anemia tomorrow.  She denies any shortness of breath, chest pain, or edema.  Denies any recent fevers.  Denies any cough.  States that she feels much improved.  No Known Allergies  Immunization History  Administered Date(s) Administered  . Influenza Split 04/03/2012  . Influenza, High Dose Seasonal PF 07/13/2018  . Influenza-Unspecified 05/22/2014  . Pneumococcal-Unspecified 04/21/2014    Past Medical History:  Diagnosis Date  . Anemia   . 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 Breast cancer ([place]) 09/22/2015   Malignant  . Breast cancer of upper-outer quadrant of left female breast ([place]) 09/08/2015  . Cataract, immature   . Heart murmur    states no known problems  . History of hyperthyroidism   . Hyperlipidemia   . Hypertension    states under control with meds., has been on med. x "long time"  . Hypothyroidism   . Nonfunctioning kidney    left  . Personal history of radiation therapy    2017  . Radiation 10/30/15-11/28/15   left breast 42.72 Gy, boosted to 10 Gy  . Renal artery stenosis ([place])   . Tobacco abuse   . Wears partial dentures    upper and  lower    Tobacco History: Social History   Tobacco Use  Smoking Status Current Every Day Smoker  . Packs/day: 1.50  . Years: 40.00  . Pack years: 60.00  . Types: Cigarettes  Smokeless Tobacco Never Used   Ready to quit: No Counseling given: Yes   Outpatient Encounter Medications as of 07/20/2018  Medication Sig  . aspirin 81 MG tablet Take 81 mg by mouth daily.  . calcium  citrate (CALCITRATE - DOSED IN MG ELEMENTAL CALCIUM) 950 MG tablet Take 1,200 mg of elemental calcium by mouth daily.  . clopidogrel (PLAVIX) 75 MG tablet TAKE 1 TABLET BY MOUTH  DAILY (Patient taking differently: Take 75 mg by mouth daily. )  . doxazosin (CARDURA) 8 MG tablet Take 8 mg by mouth daily.   . ferrous sulfate 325 (65 FE) MG tablet Take 1 tablet (325 mg total) by mouth 2 (two) times daily with a meal.  . furosemide (LASIX) 20 MG tablet Take 1 tablet (20 mg total) by mouth daily.  Marland Kitchen levothyroxine (SYNTHROID, LEVOTHROID) 88 MCG tablet Take 88 mcg by mouth daily before breakfast.  . Multiple Vitamins-Minerals (MULTIVITAMIN WITH MINERALS) tablet Take 1 tablet by mouth daily.   . pantoprazole (PROTONIX) 40 MG tablet Take 1 tablet (40 mg total) by mouth daily.  . predniSONE (DELTASONE) 20 MG tablet Take 1 tablet (20 mg total) by mouth daily with breakfast.  . rosuvastatin (CRESTOR) 10 MG tablet Take 10 mg by mouth daily.   . tamoxifen (NOLVADEX) 20 MG tablet TAKE 1 TABLET BY MOUTH  DAILY (Patient taking differently: Take 20 mg by mouth daily. )  . umeclidinium-vilanterol (ANORO ELLIPTA) 62.5-25 MCG/INH AEPB Inhale 1 puff into the lungs daily.   No facility-administered encounter medications on file as of 07/20/2018.      Review of Systems  Review of Systems  Constitutional: Negative.  Negative for chills and fever.  HENT: Negative.   Respiratory: Negative for cough and shortness of breath.   Cardiovascular: Negative.  Negative for chest pain, palpitations and leg swelling.  Gastrointestinal: Negative.   Allergic/Immunologic: Negative.   Neurological: Negative.   Psychiatric/Behavioral: Negative.        Physical Exam  BP 110/64 (BP Location: Right Arm, Patient Position: Sitting, Cuff Size: Normal)   Pulse 85   Ht 5' (1.524 m)   Wt 87 lb 12.8 oz (39.8 kg)   SpO2 93%   BMI 17.15 kg/m   Wt Readings from Last 5 Encounters:  07/20/18 87 lb 12.8 oz (39.8 kg)  07/13/18 105  lb 13.1 oz (48 kg)  06/15/18 87 lb (39.5 kg)  06/11/18 88 lb (39.9 kg)  03/31/18 87 lb 12.8 oz (39.8 kg)     Physical Exam Vitals signs and nursing note reviewed.  Constitutional:      General: She is not in acute distress.    Appearance: She is well-developed.  Cardiovascular:     Rate and Rhythm: Normal rate and regular rhythm.  Pulmonary:     Effort: Pulmonary effort is normal.     Breath sounds: Normal breath sounds.  Neurological:     Mental Status: She is alert and oriented to person, place, and time.     Imaging: Dg Chest 1 View  Result Date: 07/08/2018 CLINICAL DATA:  Followup left thoracentesis. EXAM: CHEST  1 VIEW COMPARISON:  07/06/2018 FINDINGS: Mild cardiomegaly. Aortic atherosclerosis. Worsened interstitial density consistent with worsened edema. Less pleural fluid on the left following thoracentesis. No pneumothorax. Small bilateral effusions persist with  basilar atelectasis. IMPRESSION: 1. Less pleural fluid on the left following thoracentesis. No pneumothorax. 2. Worsened interstitial edema. Persistent effusions and basilar atelectasis. Electronically Signed   By: [name] M.D.   On: 07/08/2018 16:28   Dg Chest 2 View  Result Date: 07/06/2018 CLINICAL DATA:  Cough, shortness of breath, peripheral edema. EXAM: CHEST - 2 VIEW COMPARISON:  AP view 07/13/2018 FINDINGS: Bilateral pleural effusions, left greater than right with associated atelectasis. Increased interstitial opacities above baseline consistent with mild pulmonary edema. Unchanged heart size and mediastinal contours with aortic atherosclerosis. Chronic hyperinflation. No pneumothorax. Bones are under mineralized. Postsurgical change of the left breast and axilla. IMPRESSION: Findings consistent with CHF with bilateral pleural effusions and mild pulmonary edema. Electronically Signed   By: Keith Rake M.D.   On: 07/06/2018 01:36   Ct Angio Chest Pe W Or Wo Contrast  Result Date:  07/06/2018 CLINICAL DATA:  Inpatient. Elevated D-dimer. Hypoxia. Current smoker. EXAM: CT ANGIOGRAPHY CHEST WITH CONTRAST TECHNIQUE: Multidetector CT imaging of the chest was performed using the standard protocol during bolus administration of intravenous contrast. Multiplanar CT image reconstructions and MIPs were obtained to evaluate the vascular anatomy. CONTRAST:  183mL ISOVUE-370 IOPAMIDOL (ISOVUE-370) INJECTION 76% COMPARISON:  Chest radiograph from earlier today. FINDINGS: Cardiovascular: The study is high quality for the evaluation of pulmonary embolism. There are no filling defects in the central, lobar, segmental or subsegmental pulmonary artery branches to suggest acute pulmonary embolism. Atherosclerotic nonaneurysmal thoracic aorta. 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 Dilated main pulmonary artery (3.5 cm diameter). Normal heart size. No significant pericardial fluid/thickening. Three-vessel coronary atherosclerosis. Mediastinum/Nodes: No discrete thyroid nodules. Unremarkable esophagus. No pathologically enlarged axillary, mediastinal or hilar lymph nodes. Lungs/Pleura: No pneumothorax. Small dependent bilateral pleural effusions, left greater than right. Mild centrilobular and paraseptal emphysema with mild diffuse bronchial wall thickening. Irregular 2.7 x 1.5 cm nodular focus of consolidation in the posterior left upper lobe (series 6/image 41) with surrounding patchy ground-glass opacity. Spiculated 1.8 cm solid apical right upper lobe pulmonary nodule (series 6/image 30). Mild-to-moderate compressive atelectasis in the dependent lower lobes, left greater than right. Upper abdomen: Stable asymmetric atrophy of left kidney. Musculoskeletal: No aggressive appearing focal osseous lesions. Mild thoracic spondylosis. Review of the MIP images confirms the above findings. IMPRESSION:  1. No pulmonary embolism. 2. Irregular 2.7 cm nodular focus of consolidation in the posterior left upper lobe with surrounding patchy ground-glass opacity. This finding is indeterminate for infection versus primary bronchogenic malignancy. Spiculated 1.8 cm solid apical right upper lobe pulmonary nodule, morphologically suspicious for primary bronchogenic carcinoma, although infection is also in the differential for this finding. Management considerations include short-term follow-up chest CT in 1 month following a course of antibiotic therapy versus bronchoscopy versus PET-CT (preferably after a course of antibiotic therapy), as clinically warranted. 3. Small dependent bilateral pleural effusions, left greater than right, with associated mild-to-moderate compressive atelectasis of the dependent lower lobes. 4. Mild emphysema with mild diffuse bronchial wall thickening, suggesting COPD. 5. Dilated main pulmonary artery, suggesting pulmonary arterial hypertension. 6. Aberrant mildly ectatic right subclavian artery. Aortic Atherosclerosis (ICD10-I70.0) and Emphysema (ICD10-J43.9). Electronically Signed   By: Ilona Sorrel M.D.   On: 07/06/2018 15:21   Dg Chest Port 1 View  Result Date: 07/13/2018 CLINICAL DATA:  Hypoxia EXAM: PORTABLE CHEST 1 VIEW COMPARISON:  07/10/2018 FINDINGS: There is hyperinflation of the lungs compatible with COPD. Cardiomegaly. Small to moderate  bilateral effusions with bibasilar atelectasis. Mild vascular congestion and interstitial prominence. Pulmonary edema and bibasilar opacities have improved since prior study. IMPRESSION: Cardiomegaly with vascular congestion and improving interstitial edema. Small to moderate bilateral effusions and bibasilar atelectasis, both improving since prior study. Electronically Signed   By: Rolm Baptise M.D.   On: 07/13/2018 08:02   Dg Chest Port 1 View  Result Date: 07/10/2018 CLINICAL DATA:  Hypoxia, history breast cancer, hypertension, smoker EXAM:  PORTABLE CHEST 1 VIEW COMPARISON:  Portable exam 0802 hours compared to 07/08/2018 FINDINGS: Enlargement of cardiac silhouette with vascular congestion. Atherosclerotic calcification aorta. Diffuse interstitial infiltrates compatible with pulmonary edema, increased. Slightly increased bibasilar pleural effusions and atelectasis. No pneumothorax. Bones demineralized. IMPRESSION: Slightly increased pulmonary edema, bibasilar effusions and atelectasis. Electronically Signed   By: Lavonia [name] M.D.   On: 07/10/2018 08:36   Ir Thoracentesis Asp Pleural Space W/img Guide  Result Date: 07/08/2018 INDICATION: Bilateral pleural effusions. CT findings showed a posterior left upper lobe with surrounding patchy ground glass opacity and spiculated 1.8 cm solid apical right upper lobe pulmonary nodule with morphologic suspicious for primary bronchogenic carcinoma. Request for diagnostic and therapeutic thoracentesis. EXAM: ULTRASOUND GUIDED LEFT THORACENTESIS MEDICATIONS: 1% lidocaine 9 mL COMPLICATIONS: None immediate. PROCEDURE: An ultrasound guided thoracentesis was thoroughly discussed with the patient and questions answered. The benefits, risks, alternatives and complications were also discussed. The patient understands and wishes to proceed with the procedure. Written consent was obtained. Ultrasound was performed to localize and mark an adequate pocket of fluid in the left chest. The area was then prepped and draped in the normal sterile fashion. 1% Lidocaine was used for local anesthesia. Under ultrasound guidance a 6 Fr Safe-T-Centesis catheter was introduced. Thoracentesis was performed. The catheter was removed and a dressing applied. FINDINGS: A total of approximately 550 mL of clear yellow fluid was removed. Samples were sent to the laboratory as requested by the clinical team. IMPRESSION: Successful ultrasound guided left thoracentesis yielding 550 mL of pleural fluid. Read by: Gareth Eagle, PA-C Electronically  Signed   By: Jacqulynn Cadet M.D.   On: 07/08/2018 16:15     Assessment & Plan:   Multiple lung nodules CT scan in hospital showed a irregular 2.7 cm nodular focus of consolidation in the posterior left upper lobe with surrounding patchy glass opacity spiculated 1.8 cm solid focal right upper lobe pulmonary nodule suspicious for bronchogenic carcinoma.  Was recommended that patient follow-up with bronchoscopy and PET scan for further diagnosis.  Will schedule follow-up with Dr. Lamonte Sakai after PET scan.  Patient Instructions  Will schedule PET scan - lung nodules May taper off prednisone this week. Continue Anoro Continue O2 as needed to keep Sats above 88% Increase activity as tolerated Continue to work on quitting smoking Follow up with Dr. Lamonte Sakai at his 1st available appointment within 4 weeks or sooner if needed    Acute respiratory failure with hypoxia [facility]) Patient Instructions  Will schedule PET scan - lung nodules May taper off prednisone this week. Continue Anoro Continue O2 as needed to keep Sats above 88% Increase activity as tolerated Continue to work on quitting smoking Follow up with Dr. Lamonte Sakai at his 1st available appointment within 4 weeks or sooner if needed    Chronic obstructive pulmonary disease [facility]) Patient Instructions  Will schedule PET scan - lung nodules May taper off prednisone this week. Continue Anoro Continue O2 as needed to keep Sats above 88% Increase activity as tolerated Continue to work on quitting smoking  Follow up with Dr. Lamonte Sakai at his 1st available appointment within 4 weeks or sooner if needed    Tobacco abuse Please quit smoking before next visit     Fenton Foy, NP 07/20/2018

## 2018-07-21 DIAGNOSIS — J811 Chronic pulmonary edema: Secondary | ICD-10-CM | POA: Diagnosis not present

## 2018-07-21 DIAGNOSIS — J9601 Acute respiratory failure with hypoxia: Secondary | ICD-10-CM | POA: Diagnosis not present

## 2018-07-21 DIAGNOSIS — R918 Other nonspecific abnormal finding of lung field: Secondary | ICD-10-CM | POA: Diagnosis not present

## 2018-07-21 DIAGNOSIS — J441 Chronic obstructive pulmonary disease with (acute) exacerbation: Secondary | ICD-10-CM | POA: Diagnosis not present

## 2018-07-21 DIAGNOSIS — D509 Iron deficiency anemia, unspecified: Secondary | ICD-10-CM | POA: Diagnosis not present

## 2018-07-21 DIAGNOSIS — Z09 Encounter for follow-up examination after completed treatment for conditions other than malignant neoplasm: Secondary | ICD-10-CM | POA: Diagnosis not present

## 2018-07-29 ENCOUNTER — Ambulatory Visit (HOSPITAL_COMMUNITY)
Admission: RE | Admit: 2018-07-29 | Payer: Medicare Other | Source: Ambulatory Visit | Attending: Cardiovascular Disease | Admitting: Cardiovascular Disease

## 2018-07-30 ENCOUNTER — Encounter (HOSPITAL_COMMUNITY)
Admission: RE | Admit: 2018-07-30 | Discharge: 2018-07-30 | Disposition: A | Discharge status: Home / Self Care | Payer: Medicare Other | Source: Ambulatory Visit | Attending: Nurse Practitioner | Admitting: Nurse Practitioner

## 2018-07-30 DIAGNOSIS — R918 Other nonspecific abnormal finding of lung field: Secondary | ICD-10-CM | POA: Insufficient documentation

## 2018-07-30 LAB — GLUCOSE, CAPILLARY: Glucose-Capillary: 104 mg/dL — ABNORMAL HIGH (ref 70–99)

## 2018-07-30 MED ORDER — FLUDEOXYGLUCOSE F - 18 (FDG) INJECTION
5.1000 | Freq: Once | INTRAVENOUS | Status: AC | PRN
  Administered 2018-07-30: 5.1 via INTRAVENOUS

## 2018-08-07 DIAGNOSIS — E0590 Thyrotoxicosis, unspecified without thyrotoxic crisis or storm: Secondary | ICD-10-CM | POA: Diagnosis not present

## 2018-08-07 DIAGNOSIS — E782 Mixed hyperlipidemia: Secondary | ICD-10-CM | POA: Diagnosis not present

## 2018-08-07 DIAGNOSIS — R7303 Prediabetes: Secondary | ICD-10-CM | POA: Diagnosis not present

## 2018-08-07 DIAGNOSIS — I10 Essential (primary) hypertension: Secondary | ICD-10-CM | POA: Diagnosis not present

## 2018-08-07 DIAGNOSIS — N183 Chronic kidney disease, stage 3 (moderate): Secondary | ICD-10-CM | POA: Diagnosis not present

## 2018-08-11 ENCOUNTER — Ambulatory Visit (INDEPENDENT_AMBULATORY_CARE_PROVIDER_SITE_OTHER): Payer: Medicare Other | Admitting: Emergency Medicine

## 2018-08-11 ENCOUNTER — Encounter: Payer: Self-pay | Admitting: Emergency Medicine

## 2018-08-11 VITALS — BP 102/78 | HR 119 | Ht 60.0 in | Wt 86.0 lb

## 2018-08-11 DIAGNOSIS — R918 Other nonspecific abnormal finding of lung field: Secondary | ICD-10-CM | POA: Diagnosis not present

## 2018-08-11 NOTE — Patient Instructions (Addendum)
We will arrange for navigational bronchoscopy to further investigate your pulmonary nodules. Please continue your Anoro as you have been taking it. Keep your albuterol available use 2 puffs if needed for shortness of breath. We will need to work hard going forward on smoking cessation. Follow with Dr Lamonte Sakai in 1 month

## 2018-08-11 NOTE — Assessment & Plan Note (Signed)
Please continue your Anoro as you have been taking it. Keep your albuterol available use 2 puffs if needed for shortness of breath.

## 2018-08-11 NOTE — Assessment & Plan Note (Signed)
She has bilateral upper lobe nodular opacities that are suspicious for primary lung cancer.  They are hypermetabolic on PET scan.  I think navigational bronchoscopy is indicated given our suspicion and her smoking history.  Discussed this with her today.  I went over the risks and benefits of the procedure and she understands.  She agrees to navigational bronchoscopy and I will work on setting this up.  She had a CT-PA in December and we may be able to convert this to a super D disc.  If so I will obtain that.  If unavailable then we will repeat the super D CT chest.  We will arrange for navigational bronchoscopy to further investigate your pulmonary nodules. We will need to work hard going forward on smoking cessation. Follow with Dr Lamonte Sakai in 1 month

## 2018-08-11 NOTE — Progress Notes (Signed)
Subjective:    Patient ID: Kendra Watts, female    DOB: 02-06-1946, 73 y.o.   MRN: [id number]  HPI 73 year old smoker (60 pack years) with a history of L breast cancer (2017) treated with lumpectomy and radiation therapy, renal artery stenosis, hypothyroidism, hypertension, anemia, COPD with severe obstruction noted on pulmonary function testing from 05/14/2017 (FEV1 0.98 L, 52% predicted).  She was admitted to the hospital in late December with an apparent acute exacerbation of her COPD.  She had a CT scan of the chest done on 12/16 during that admission that showed an irregular 2.7 cm nodular focus in the posterior left upper lobe with some surrounding groundglass opacity, a spiculated 1.8 cm solid apical right upper lobe pulmonary nodule.  A subsequent PET scan was done on 07/30/2018 that I have reviewed.  This shows that both the right upper lobe spiculated nodule and the posterior left upper lobe irregular nodule remain, are hypermetabolic.  She is here today for further evaluation of the abnormalities on CT.   Current bronchodilator therapy is Anoro.  She uses albuterol approximately.  She is on Plavix and aspirin 81 mg. She was sent home from hospital with O2. She has been active, walks her dog. Able to do her ADL and shopping.    Review of Systems    Past Medical History:  Diagnosis Date  . Anemia   . 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 Breast cancer ([place]) 09/22/2015   Malignant  . Breast cancer of upper-outer quadrant of left female breast ([place]) 09/08/2015  . Cataract, immature   . Heart murmur    states no known problems  . History of hyperthyroidism   . Hyperlipidemia   . Hypertension    states under control with meds., has been on med. x "long time"  . Hypothyroidism   . Nonfunctioning kidney    left  . Personal history of radiation therapy    2017  . Radiation 10/30/15-11/28/15   left breast 42.72 Gy, boosted to 10 Gy  . Renal artery stenosis ([place])   . Tobacco  abuse   . Wears partial dentures    upper and lower     Family History  Problem Relation Age of Onset  . Heart disease Mother        MI @ 66, died at 49  . Cancer Father   . Lung cancer Father   . Heart disease Maternal Grandmother   . Lung cancer Sister   . Breast cancer Paternal Aunt      Social History   Socioeconomic History  . Marital status: Divorced    Spouse name: Not on file  . Number of children: 0  . Years of education: Not on file  . Highest education level: Not on file  Occupational History  . 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 Financial resource strain: Not on file  . Food insecurity:    Worry: Not on file    Inability: Not on file  . Transportation needs:    Medical: Not on file    Non-medical: Not on file  Tobacco Use  . Smoking status: Current Every Day Smoker    Packs/day: 1.50    Years: 40.00    Pack years: 60.00    Types: Cigarettes  . Smokeless tobacco: Never Used  Substance and Sexual Activity  . Alcohol use: Yes    Comment: occasionally  . Drug use:  No  . Sexual activity: Never  Lifestyle  . Physical activity:    Days per week: Not on file    Minutes per session: Not on file  . Stress: Not on file  Relationships  . Social connections:    Talks on phone: Not on file    Gets together: Not on file    Attends religious service: Not on file    Active member of club or organization: Not on file    Attends meetings of clubs or organizations: Not on file    Relationship status: Not on file  . Intimate partner violence:    Fear of current or ex partner: Not on file    Emotionally abused: Not on file    Physically abused: Not on file    Forced sexual activity: Not on file  Other Topics Concern  . Not on file  Social History Narrative  . Not on file     No Known Allergies   Outpatient Medications Prior to Visit  Medication Sig Dispense Refill  . aspirin 81 MG tablet Take 81 mg by mouth  daily.    . calcium citrate (CALCITRATE - DOSED IN MG ELEMENTAL CALCIUM) 950 MG tablet Take 1,200 mg of elemental calcium by mouth daily.    . clopidogrel (PLAVIX) 75 MG tablet TAKE 1 TABLET BY MOUTH  DAILY (Patient taking differently: Take 75 mg by mouth daily. ) 90 tablet 1  . doxazosin (CARDURA) 8 MG tablet Take 8 mg by mouth daily.     . ferrous sulfate 325 (65 FE) MG tablet Take 1 tablet (325 mg total) by mouth 2 (two) times daily with a meal. 60 tablet 0  . furosemide (LASIX) 20 MG tablet Take 1 tablet (20 mg total) by mouth daily. 30 tablet 0  . levothyroxine (SYNTHROID, LEVOTHROID) 88 MCG tablet Take 88 mcg by mouth daily before breakfast.    . Multiple Vitamins-Minerals (MULTIVITAMIN WITH MINERALS) tablet Take 1 tablet by mouth daily.     . pantoprazole (PROTONIX) 40 MG tablet Take 1 tablet (40 mg total) by mouth daily. 30 tablet 0  . rosuvastatin (CRESTOR) 10 MG tablet Take 10 mg by mouth daily.     . tamoxifen (NOLVADEX) 20 MG tablet TAKE 1 TABLET BY MOUTH  DAILY (Patient taking differently: Take 20 mg by mouth daily. ) 90 tablet 3  . umeclidinium-vilanterol (ANORO ELLIPTA) 62.5-25 MCG/INH AEPB Inhale 1 puff into the lungs daily.    . predniSONE (DELTASONE) 20 MG tablet Take 1 tablet (20 mg total) by mouth daily with breakfast. 5 tablet 0   No facility-administered medications prior to visit.         Objective:   Physical Exam Vitals:   08/11/18 1349  BP: 102/78  Pulse: (!) 119  SpO2: 99%  Weight: 86 lb (39 kg)  Height: 5' (1.524 m)   Gen: Pleasant, thin, in no distress,  normal affect  ENT: No lesions,  mouth clear,  oropharynx clear, no postnasal drip  Neck: No JVD, no stridor  Lungs: No use of accessory muscles, distant, no crackles or wheezing on normal respiration, no wheeze on forced expiration  Cardiovascular: RRR, heart sounds normal, no murmur or gallops, no peripheral edema  Musculoskeletal: No deformities, no cyanosis or clubbing  Neuro: alert, awake,  non focal  Skin: Warm, no lesions or rash      Assessment & Plan:  Multiple lung nodules She has bilateral upper lobe nodular opacities that are suspicious for primary lung  cancer.  They are hypermetabolic on PET scan.  I think navigational bronchoscopy is indicated given our suspicion and her smoking history.  Discussed this with her today.  I went over the risks and benefits of the procedure and she understands.  She agrees to navigational bronchoscopy and I will work on setting this up.  She had a CT-PA in December and we may be able to convert this to a super D disc.  If so I will obtain that.  If unavailable then we will repeat the super D CT chest.  We will arrange for navigational bronchoscopy to further investigate your pulmonary nodules. We will need to work hard going forward on smoking cessation. Follow with Dr Lamonte Sakai in 1 month   Chronic obstructive pulmonary disease ([place]) Please continue your Anoro as you have been taking it. Keep your albuterol available use 2 puffs if needed for shortness of breath.  Baltazar Apo, MD, PhD 08/11/2018, 2:25 PM [facility] [phone] or if no answer [phone]

## 2018-08-11 NOTE — H&P (View-Only) (Signed)
Subjective:    Patient ID: Kendra Watts, female    DOB: 1946/01/05, 73 y.o.   MRN: [id number]  HPI 73 year old smoker (60 pack years) with a history of L breast cancer (2017) treated with lumpectomy and radiation therapy, renal artery stenosis, hypothyroidism, hypertension, anemia, COPD with severe obstruction noted on pulmonary function testing from 05/14/2017 (FEV1 0.98 L, 52% predicted).  She was admitted to the hospital in late December with an apparent acute exacerbation of her COPD.  She had a CT scan of the chest done on 12/16 during that admission that showed an irregular 2.7 cm nodular focus in the posterior left upper lobe with some surrounding groundglass opacity, a spiculated 1.8 cm solid apical right upper lobe pulmonary nodule.  A subsequent PET scan was done on 07/30/2018 that I have reviewed.  This shows that both the right upper lobe spiculated nodule and the posterior left upper lobe irregular nodule remain, are hypermetabolic.  She is here today for further evaluation of the abnormalities on CT.   Current bronchodilator therapy is Anoro.  She uses albuterol approximately.  She is on Plavix and aspirin 81 mg. She was sent home from hospital with O2. She has been active, walks her dog. Able to do her ADL and shopping.    Review of Systems    Past Medical History:  Diagnosis Date  . Anemia   . 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 Breast cancer ([place]) 09/22/2015   Malignant  . Breast cancer of upper-outer quadrant of left female breast ([place]) 09/08/2015  . Cataract, immature   . Heart murmur    states no known problems  . History of hyperthyroidism   . Hyperlipidemia   . Hypertension    states under control with meds., has been on med. x "long time"  . Hypothyroidism   . Nonfunctioning kidney    left  . Personal history of radiation therapy    2017  . Radiation 10/30/15-11/28/15   left breast 42.72 Gy, boosted to 10 Gy  . Renal artery stenosis ([place])   . Tobacco  abuse   . Wears partial dentures    upper and lower     Family History  Problem Relation Age of Onset  . Heart disease Mother        MI @ 52, died at 67  . Cancer Father   . Lung cancer Father   . Heart disease Maternal Grandmother   . Lung cancer Sister   . Breast cancer Paternal Aunt      Social History   Socioeconomic History  . Marital status: Divorced    Spouse name: Not on file  . Number of children: 0  . Years of education: Not on file  . Highest education level: Not on file  Occupational History  . 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 Financial resource strain: Not on file  . Food insecurity:    Worry: Not on file    Inability: Not on file  . Transportation needs:    Medical: Not on file    Non-medical: Not on file  Tobacco Use  . Smoking status: Current Every Day Smoker    Packs/day: 1.50    Years: 40.00    Pack years: 60.00    Types: Cigarettes  . Smokeless tobacco: Never Used  Substance and Sexual Activity  . Alcohol use: Yes    Comment: occasionally  . Drug use:  No  . Sexual activity: Never  Lifestyle  . Physical activity:    Days per week: Not on file    Minutes per session: Not on file  . Stress: Not on file  Relationships  . Social connections:    Talks on phone: Not on file    Gets together: Not on file    Attends religious service: Not on file    Active member of club or organization: Not on file    Attends meetings of clubs or organizations: Not on file    Relationship status: Not on file  . Intimate partner violence:    Fear of current or ex partner: Not on file    Emotionally abused: Not on file    Physically abused: Not on file    Forced sexual activity: Not on file  Other Topics Concern  . Not on file  Social History Narrative  . Not on file     No Known Allergies   Outpatient Medications Prior to Visit  Medication Sig Dispense Refill  . aspirin 81 MG tablet Take 81 mg by mouth  daily.    . calcium citrate (CALCITRATE - DOSED IN MG ELEMENTAL CALCIUM) 950 MG tablet Take 1,200 mg of elemental calcium by mouth daily.    . clopidogrel (PLAVIX) 75 MG tablet TAKE 1 TABLET BY MOUTH  DAILY (Patient taking differently: Take 75 mg by mouth daily. ) 90 tablet 1  . doxazosin (CARDURA) 8 MG tablet Take 8 mg by mouth daily.     . ferrous sulfate 325 (65 FE) MG tablet Take 1 tablet (325 mg total) by mouth 2 (two) times daily with a meal. 60 tablet 0  . furosemide (LASIX) 20 MG tablet Take 1 tablet (20 mg total) by mouth daily. 30 tablet 0  . levothyroxine (SYNTHROID, LEVOTHROID) 88 MCG tablet Take 88 mcg by mouth daily before breakfast.    . Multiple Vitamins-Minerals (MULTIVITAMIN WITH MINERALS) tablet Take 1 tablet by mouth daily.     . pantoprazole (PROTONIX) 40 MG tablet Take 1 tablet (40 mg total) by mouth daily. 30 tablet 0  . rosuvastatin (CRESTOR) 10 MG tablet Take 10 mg by mouth daily.     . tamoxifen (NOLVADEX) 20 MG tablet TAKE 1 TABLET BY MOUTH  DAILY (Patient taking differently: Take 20 mg by mouth daily. ) 90 tablet 3  . umeclidinium-vilanterol (ANORO ELLIPTA) 62.5-25 MCG/INH AEPB Inhale 1 puff into the lungs daily.    . predniSONE (DELTASONE) 20 MG tablet Take 1 tablet (20 mg total) by mouth daily with breakfast. 5 tablet 0   No facility-administered medications prior to visit.         Objective:   Physical Exam Vitals:   08/11/18 1349  BP: 102/78  Pulse: (!) 119  SpO2: 99%  Weight: 86 lb (39 kg)  Height: 5' (1.524 m)   Gen: Pleasant, thin, in no distress,  normal affect  ENT: No lesions,  mouth clear,  oropharynx clear, no postnasal drip  Neck: No JVD, no stridor  Lungs: No use of accessory muscles, distant, no crackles or wheezing on normal respiration, no wheeze on forced expiration  Cardiovascular: RRR, heart sounds normal, no murmur or gallops, no peripheral edema  Musculoskeletal: No deformities, no cyanosis or clubbing  Neuro: alert, awake,  non focal  Skin: Warm, no lesions or rash      Assessment & Plan:  Multiple lung nodules She has bilateral upper lobe nodular opacities that are suspicious for primary lung  cancer.  They are hypermetabolic on PET scan.  I think navigational bronchoscopy is indicated given our suspicion and her smoking history.  Discussed this with her today.  I went over the risks and benefits of the procedure and she understands.  She agrees to navigational bronchoscopy and I will work on setting this up.  She had a CT-PA in December and we may be able to convert this to a super D disc.  If so I will obtain that.  If unavailable then we will repeat the super D CT chest.  We will arrange for navigational bronchoscopy to further investigate your pulmonary nodules. We will need to work hard going forward on smoking cessation. Follow with Dr Lamonte Sakai in 1 month   Chronic obstructive pulmonary disease ([place]) Please continue your Anoro as you have been taking it. Keep your albuterol available use 2 puffs if needed for shortness of breath.  Baltazar Apo, MD, PhD 08/11/2018, 2:25 PM [facility] [phone] or if no answer [phone]

## 2018-08-12 DIAGNOSIS — I701 Atherosclerosis of renal artery: Secondary | ICD-10-CM | POA: Diagnosis not present

## 2018-08-12 DIAGNOSIS — E890 Postprocedural hypothyroidism: Secondary | ICD-10-CM | POA: Diagnosis not present

## 2018-08-12 DIAGNOSIS — E782 Mixed hyperlipidemia: Secondary | ICD-10-CM | POA: Diagnosis not present

## 2018-08-12 DIAGNOSIS — C50412 Malignant neoplasm of upper-outer quadrant of left female breast: Secondary | ICD-10-CM | POA: Diagnosis not present

## 2018-08-12 DIAGNOSIS — Z0000 Encounter for general adult medical examination without abnormal findings: Secondary | ICD-10-CM | POA: Diagnosis not present

## 2018-08-12 DIAGNOSIS — E46 Unspecified protein-calorie malnutrition: Secondary | ICD-10-CM | POA: Diagnosis not present

## 2018-08-12 DIAGNOSIS — I779 Disorder of arteries and arterioles, unspecified: Secondary | ICD-10-CM | POA: Diagnosis not present

## 2018-08-12 DIAGNOSIS — J449 Chronic obstructive pulmonary disease, unspecified: Secondary | ICD-10-CM | POA: Diagnosis not present

## 2018-08-12 DIAGNOSIS — I739 Peripheral vascular disease, unspecified: Secondary | ICD-10-CM | POA: Diagnosis not present

## 2018-08-12 DIAGNOSIS — I700 Atherosclerosis of aorta: Secondary | ICD-10-CM | POA: Diagnosis not present

## 2018-08-12 DIAGNOSIS — I129 Hypertensive chronic kidney disease with stage 1 through stage 4 chronic kidney disease, or unspecified chronic kidney disease: Secondary | ICD-10-CM | POA: Diagnosis not present

## 2018-08-17 ENCOUNTER — Other Ambulatory Visit: Payer: Self-pay | Admitting: General Surgery

## 2018-08-17 DIAGNOSIS — Z853 Personal history of malignant neoplasm of breast: Secondary | ICD-10-CM

## 2018-08-20 DIAGNOSIS — Z7901 Long term (current) use of anticoagulants: Secondary | ICD-10-CM | POA: Diagnosis not present

## 2018-08-20 DIAGNOSIS — R040 Epistaxis: Secondary | ICD-10-CM | POA: Diagnosis not present

## 2018-08-21 DIAGNOSIS — L821 Other seborrheic keratosis: Secondary | ICD-10-CM | POA: Diagnosis not present

## 2018-08-21 DIAGNOSIS — D0461 Carcinoma in situ of skin of right upper limb, including shoulder: Secondary | ICD-10-CM | POA: Diagnosis not present

## 2018-08-21 DIAGNOSIS — Z85828 Personal history of other malignant neoplasm of skin: Secondary | ICD-10-CM | POA: Diagnosis not present

## 2018-08-25 NOTE — Pre-Procedure Instructions (Signed)
Guillermina Shaft [facility]  08/25/2018      [id number] - [place], [place] [address] [place] [address] Phone: [phone] Fax: [phone]    Your procedure is scheduled on Wednesday February 12th.  Report to [facility] Admitting at 8:30 A.M.  Call this number if you have problems the morning of surgery:  [phone]   Remember:  Do not eat or drink after midnight.     Take these medicines the morning of surgery with A SIP OF WATER  doxazosin (CARDURA)  levothyroxine (SYNTHROID, LEVOTHROID) Naphazoline HCl (CLEAR EYES OP) pantoprazole (PROTONIX)  rosuvastatin (CRESTOR) tamoxifen (NOLVADEX)  umeclidinium-vilanterol (ANORO ELLIPTA)    Follow your surgeon's instructions on when to stop Asprin and clopidogrel (PLAVIX)  .  If no instructions were given by your surgeon then you will need to call the office to get those instructions.    7 days prior to surgery STOP taking any Aspirin(unless otherwise instructed by your surgeon), Aleve, Naproxen, Ibuprofen, Motrin, Advil, Goody's, BC's, all herbal medications, fish oil, and all vitamins    Do not wear jewelry, make-up or nail polish.  Do not wear lotions, powders, or perfumes, or deodorant.  Do not shave 48 hours prior to surgery.  Men may shave face and neck.  Do not bring valuables to the hospital.  [facility] is not responsible for any belongings or valuables.  Contacts, eyeglasses, hearing aids, dentures or bridgework may not be worn into surgery.  Leave your suitcase in the car.  After surgery it may be brought to your room.  For patients admitted to the hospital, discharge time will be determined by your treatment team.  Patients discharged the day of surgery will not be allowed to drive home.   [facility]- Preparing For Surgery  Before surgery, you can play an important role. Because skin is not sterile, your skin needs to be as free of  germs as possible. You can reduce the number of germs on your skin by washing with CHG (chlorahexidine gluconate) Soap before surgery.  CHG is an antiseptic cleaner which kills germs and bonds with the skin to continue killing germs even after washing.    Oral Hygiene is also important to reduce your risk of infection.  Remember - BRUSH YOUR TEETH THE MORNING OF SURGERY WITH YOUR REGULAR TOOTHPASTE  Please do not use if you have an allergy to CHG or antibacterial soaps. If your skin becomes reddened/irritated stop using the CHG.  Do not shave (including legs and underarms) for at least 48 hours prior to first CHG shower. It is OK to shave your face.  Please follow these instructions carefully.   1. Shower the NIGHT BEFORE SURGERY and the MORNING OF SURGERY with CHG.   2. If you chose to wash your hair, wash your hair first as usual with your normal shampoo.  3. After you shampoo, rinse your hair and body thoroughly to remove the shampoo.  4. Use CHG as you would any other liquid soap. You can apply CHG directly to the skin and wash gently with a scrungie or a clean washcloth.   5. Apply the CHG Soap to your body ONLY FROM THE NECK DOWN.  Do not use on open wounds or open sores. Avoid contact with your eyes, ears, mouth and genitals (private parts). Wash Face and genitals (private parts)  with your normal soap.  6. Wash thoroughly, paying special attention to  the area where your surgery will be performed.  7. Thoroughly rinse your body with warm water from the neck down.  8. DO NOT shower/wash with your normal soap after using and rinsing off the CHG Soap.  9. Pat yourself dry with a CLEAN TOWEL.  10. Wear CLEAN PAJAMAS to bed the night before surgery, wear comfortable clothes the morning of surgery  11. Place CLEAN SHEETS on your bed the night of your first shower and DO NOT SLEEP WITH PETS.    Day of Surgery: Shower as stated above. Do not apply any deodorants/lotions.  Please  wear clean clothes to the hospital/surgery center.   Remember to brush your teeth WITH YOUR REGULAR TOOTHPASTE.   Please read over the following fact sheets that you were given.

## 2018-08-26 ENCOUNTER — Encounter (HOSPITAL_COMMUNITY)
Admission: RE | Admit: 2018-08-26 | Discharge: 2018-08-26 | Disposition: A | Discharge status: Home / Self Care | Payer: Medicare Other | Source: Ambulatory Visit | Attending: Emergency Medicine | Admitting: Emergency Medicine

## 2018-08-26 ENCOUNTER — Other Ambulatory Visit: Payer: Self-pay

## 2018-08-26 ENCOUNTER — Encounter (HOSPITAL_COMMUNITY): Payer: Self-pay

## 2018-08-26 DIAGNOSIS — Z01812 Encounter for preprocedural laboratory examination: Secondary | ICD-10-CM | POA: Insufficient documentation

## 2018-08-26 HISTORY — DX: Chronic obstructive pulmonary disease, unspecified: J44.9

## 2018-08-26 HISTORY — DX: Hypokalemia: E87.6

## 2018-08-26 HISTORY — DX: Heart failure, unspecified: I50.9

## 2018-08-26 LAB — COMPREHENSIVE METABOLIC PANEL
ALT: 18 U/L (ref 0–44)
AST: 24 U/L (ref 15–41)
Albumin: 3.8 g/dL (ref 3.5–5.0)
Alkaline Phosphatase: 55 U/L (ref 38–126)
Anion gap: 14 (ref 5–15)
BUN: 21 mg/dL (ref 8–23)
CO2: 24 mmol/L (ref 22–32)
Calcium: 9.3 mg/dL (ref 8.9–10.3)
Chloride: 103 mmol/L (ref 98–111)
Creatinine, Ser: 0.9 mg/dL (ref 0.44–1.00)
GFR calc Af Amer: >60 mL/min (ref >60)
GFR calc non Af Amer: >60 mL/min (ref >60)
Glucose, Bld: 84 mg/dL (ref 70–99)
Potassium: 3.8 mmol/L (ref 3.5–5.1)
Sodium: 141 mmol/L (ref 135–145)
Total Bilirubin: 0.7 mg/dL (ref 0.3–1.2)
Total Protein: 6.7 g/dL (ref 6.5–8.1)

## 2018-08-26 LAB — CBC
HCT: 38.8 % (ref 36.0–46.0)
Hemoglobin: 12.6 g/dL (ref 12.0–15.0)
MCH: 28.6 pg (ref 26.0–34.0)
MCHC: 32.5 g/dL (ref 30.0–36.0)
MCV: 88.2 fL (ref 80.0–100.0)
Platelets: 222 10*3/uL (ref 150–400)
RBC: 4.4 MIL/uL (ref 3.87–5.11)
RDW: 20.4 % — ABNORMAL HIGH (ref 11.5–15.5)
WBC: 7.4 10*3/uL (ref 4.0–10.5)
nRBC: 0 % (ref 0.0–0.2)

## 2018-08-26 LAB — PROTIME-INR
INR: 1.05
Prothrombin Time: 13.6 seconds (ref 11.4–15.2)

## 2018-08-26 LAB — APTT: aPTT: 27 seconds (ref 24–36)

## 2018-08-26 NOTE — Progress Notes (Signed)
PCP - Dr. Ashby Dawes Cardiologist - Dr. Adora Fridge  Chest x-ray - 07/13/18-will need a 1 view CXR s/p bronchoscopy EKG - 07/08/18 Stress Test - 2013 ECHO - 07/06/18 Cardiac Cath - 2013  Sleep Study - denies  Blood Thinner Instructions: Plavix-5 days prior to surgery Aspirin Instructions: Hold 2 days prior to surgery.  Anesthesia review: Yes, heart history  Patient denies shortness of breath, fever, cough and chest pain at PAT appointment   Patient verbalized understanding of instructions that were given to them at the PAT appointment. Patient was also instructed that they will need to review over the PAT instructions again at home before surgery.

## 2018-08-27 ENCOUNTER — Encounter (HOSPITAL_COMMUNITY): Payer: Medicare Other

## 2018-08-27 NOTE — Progress Notes (Signed)
Anesthesia Chart Review:  Case:  [id number] Date/Time:  09/02/18 1007   Procedure:  VIDEO BRONCHOSCOPY WITH ENDOBRONCHIAL NAVIGATION (N/A )   Anesthesia type:  General   Pre-op diagnosis:  MULTIPLE LUNG NODULES   Location:  [facility] OR ROOM 10 / [facility]   Surgeon:  [name], MD      DISCUSSION: 73 yo female current smoker. Pertinent hx includes HTN, Anemia, COPD (FEV1 0.98 L, 52% predicted), Congestive HF, Renal artery stenosis (s/p right renal PTA and stenting 2016), Carotid artery stenosis (s/p Right CEA 2013).  Pt recently admitted 12/16-12/23/2019 for COPD exacerbation with acute hypoxic resp failure secondary to bilateral pleural effusions. CTA PE was negative for PE but with pulmonary nodules on left and right lungs. Post left thoracentesis 550 cc fluid removed. Fluid analysis revealed reactive mesothelial cells. Home O2 evaluation completed with recommendations for 4 L of oxygen by nasal cannula on ambulation. 2D echo done on 07/06/2018 was unrevealing with LVEF 60 to 65% with no regional wall motion abnormality.  She was seen in followup by pulmonology s/p discharge. Per Dr. Agustina Caroli note 08/11/18 "She was sent home from hospital with O2.She has been active, walks her dog. Able to do her ADL and shopping."  Anticipate she can proceed as planned barring acute status change.   VS: BP 99/62   Pulse 91   Temp 36.6 C   Resp 18   Ht 5' (1.524 m)   Wt 39.9 kg   SpO2 95%   BMI 17.19 kg/m   PROVIDERS: Merrilee Seashore, MD is PCP  Baltazar Apo, MD is Pulmonologist  Ancil Linsey, MD is Cardiologist  LABS: Labs reviewed: Acceptable for surgery. (all labs ordered are listed, but only abnormal results are displayed)  Labs Reviewed  CBC - Abnormal; Notable for the following components:      Result Value   RDW 20.4 (*)    All other components within normal limits  APTT  COMPREHENSIVE METABOLIC PANEL  PROTIME-INR     IMAGES: NM PET 07/30/2018: IMPRESSION: 1. Hypermetabolic  bilateral upper lobe lung nodules, highly suspicious for synchronous primary bronchogenic carcinomas. No hypermetabolic thoracic adenopathy. 2. Hypermetabolic left parotid nodule which could represent a synchronous benign or malignant neoplasm. 3. Resolved bilateral pleural effusions. 4. Aortic atherosclerosis (ICD10-I70.0), coronary artery atherosclerosis and emphysema (ICD10-J43.9). 5. Left renal atrophy.  EKG: 07/06/2018: Sinus rhythm. Rate 82. Anteroseptal infarct, age indeterminate  CV: TTE 07/06/2018: Study Conclusions  - Left ventricle: The cavity size was normal. Systolic function was   normal. The estimated ejection fraction was in the range of 60%   to 65%. Wall motion was normal; there were no regional wall   motion abnormalities. Doppler parameters are consistent with both   elevated ventricular end-diastolic filling pressure and elevated   left atrial filling pressure. - Aortic valve: Sclerosis without stenosis.  Carotid US 04/30/2018: Summary: Right Carotid: Velocities in the right ICA are consistent with a 1-39% stenosis,                s/p endarterectomy. The RICA velocities remain within normal                range and stable compared to the prior exam, s/p endarterectomy.  Left Carotid: Velocities in the left ICA are consistent with a 1-39% stenosis.               The LICA velocities remain within normal range and stable compared  to the prior exam.  Vertebrals:  Left vertebral artery demonstrates antegrade flow. Right vertebral              artery demonstrates retrograde flow. Subclavians: Right subclavian artery was stenotic. Right subclavian artery flow              was disturbed. Normal flow hemodynamics were seen in the left              subclavian artery.                Right subclavian steal syndrome. The right brachial artery              demonstrates monophasic flow; the left is biphasic. Brachial artery              pressure  difference of 32 mmHG with the lower pressure being in the              right.  Past Medical History:  Diagnosis Date  . Anemia   . 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 Breast cancer ([place]) 09/22/2015   Malignant  . Breast cancer of upper-outer quadrant of left female breast ([place]) 09/08/2015  . Cataract, immature   . CHF (congestive heart failure) ([place])    Acute CHF-06/2018  . COPD (chronic obstructive pulmonary disease) ([place])   . Heart murmur    states no known problems  . History of hyperthyroidism   . Hyperlipidemia   . Hypertension    states under control with meds., has been on med. x "long time"  . Hypokalemia    from last physical.   . Hypothyroidism   . Nonfunctioning kidney    left  . Personal history of radiation therapy    2017  . Radiation 10/30/15-11/28/15   left breast 42.72 Gy, boosted to 10 Gy  . Renal artery stenosis ([place])   . Tobacco abuse   . Wears partial dentures    upper and lower    Past Surgical History:  Procedure Laterality Date  . ABDOMINAL ANGIOGRAM  02/18/2012   Procedure: ABDOMINAL ANGIOGRAM;  Surgeon: Lorretta Harp, MD;  Location: [facility] CATH LAB;  Service: Cardiovascular;;  . ABDOMINAL AORTAGRAM  07/04/2014  . ABDOMINAL HYSTERECTOMY  ~ 1977   partial  . APPENDECTOMY    . ARCH AORTOGRAM    . BREAST LUMPECTOMY Left 09/22/2015   Malignant  . CAROTID ANGIOGRAM N/A 02/18/2012   Procedure: CAROTID ANGIOGRAM;  Surgeon: Lorretta Harp, MD;  Location: [facility] CATH LAB;  Service: Cardiovascular;  Laterality: N/A;  . ENDARTERECTOMY  04/02/2012   Procedure: ENDARTERECTOMY CAROTID;  Surgeon: Serafina Mitchell, MD;  Location: [place] OR;  Service: Vascular;  Laterality: Right;  . IR THORACENTESIS ASP PLEURAL SPACE W/IMG GUIDE  07/08/2018  . RADIOACTIVE SEED GUIDED PARTIAL MASTECTOMY WITH AXILLARY SENTINEL LYMPH NODE BIOPSY Left 09/22/2015   Procedure: INJECT BLUE DYE LEFT BREAST,RADIOACTIVE SEED GUIDED PARTIAL MASTECTOMY WITH AXILLARY SENTINEL LYMPH NODE  BIOPSY;  Surgeon: Fanny Skates, MD;  Location: [place];  Service: General;  Laterality: Left;  . RENAL ANGIOGRAM Left 06/08/2010   renal artery stent -  5x12 Genesis on Aviator balloon stent (Dr. Adora Fridge)  . RENAL ANGIOGRAM Right 07/04/2014   Procedure: RENAL ANGIOGRAM;  Surgeon: Lorretta Harp, MD;  Location: [facility] CATH LAB;  Service: Cardiovascular;  Laterality: Right;  . RENAL ANGIOGRAM Right 08/22/2014   Procedure: RENAL ANGIOGRAM;  Surgeon: Lorretta Harp, MD;  Location:  [place] CATH LAB;  Service: Cardiovascular;  Laterality: Right;  . TONSILLECTOMY     as a child    MEDICATIONS: . aspirin 81 MG tablet  . Calcium-Magnesium-Vitamin D (CALCIUM 1200+D3 PO)  . clopidogrel (PLAVIX) 75 MG tablet  . doxazosin (CARDURA) 8 MG tablet  . ferrous sulfate 325 (65 FE) MG tablet  . furosemide (LASIX) 20 MG tablet  . levothyroxine (SYNTHROID, LEVOTHROID) 88 MCG tablet  . Multiple Vitamins-Minerals (MULTIVITAMIN WITH MINERALS) tablet  . Naphazoline HCl (CLEAR EYES OP)  . pantoprazole (PROTONIX) 40 MG tablet  . rosuvastatin (CRESTOR) 10 MG tablet  . tamoxifen (NOLVADEX) 20 MG tablet  . umeclidinium-vilanterol (ANORO ELLIPTA) 62.5-25 MCG/INH AEPB   No current facility-administered medications for this encounter.     Kendra Watts [facility] Short Stay Center/Anesthesiology Phone [phone] 08/27/2018 11:57 AM

## 2018-08-27 NOTE — Anesthesia Preprocedure Evaluation (Addendum)
Anesthesia Evaluation    Airway Mallampati: II  TM Distance: >3 FB Neck ROM: Full    Dental no notable dental hx.    Pulmonary COPD, Current Smoker,    Pulmonary exam normal breath sounds clear to auscultation       Cardiovascular hypertension, Pt. on medications +CHF  Normal cardiovascular exam Rhythm:Regular Rate:Normal     Neuro/Psych    GI/Hepatic   Endo/Other  Hypothyroidism   Renal/GU      Musculoskeletal   Abdominal   Peds  Hematology   Anesthesia Other Findings   Reproductive/Obstetrics                            Anesthesia Physical Anesthesia Plan  ASA: III  Anesthesia Plan: General   Post-op Pain Management:    Induction: Intravenous  PONV Risk Score and Plan: 2 and Ondansetron, Midazolam and Treatment may vary due to age or medical condition  Airway Management Planned: Oral ETT  Additional Equipment:   Intra-op Plan:   Post-operative Plan: Extubation in OR  Informed Consent: I have reviewed the patients History and Physical, chart, labs and discussed the procedure including the risks, benefits and alternatives for the proposed anesthesia with the patient or authorized representative who has indicated his/her understanding and acceptance.     Dental advisory given  Plan Discussed with: CRNA  Anesthesia Plan Comments: (See PAT note by Karoline Caldwell, PA-C )       Anesthesia Quick Evaluation

## 2018-09-02 ENCOUNTER — Other Ambulatory Visit: Payer: Self-pay

## 2018-09-02 ENCOUNTER — Ambulatory Visit (HOSPITAL_COMMUNITY): Payer: Medicare Other | Admitting: Certified Registered Nurse Anesthetist

## 2018-09-02 ENCOUNTER — Ambulatory Visit (HOSPITAL_COMMUNITY): Payer: Medicare Other | Admitting: Physician Assistant

## 2018-09-02 ENCOUNTER — Ambulatory Visit (HOSPITAL_COMMUNITY)
Admission: RE | Admit: 2018-09-02 | Discharge: 2018-09-02 | Disposition: A | Discharge status: Home / Self Care | Payer: Medicare Other | Attending: Emergency Medicine | Admitting: Emergency Medicine

## 2018-09-02 ENCOUNTER — Encounter (HOSPITAL_COMMUNITY): Payer: Self-pay | Admitting: Emergency Medicine

## 2018-09-02 ENCOUNTER — Ambulatory Visit (HOSPITAL_COMMUNITY): Payer: Medicare Other

## 2018-09-02 ENCOUNTER — Encounter (HOSPITAL_COMMUNITY)
Admission: RE | Disposition: A | Discharge status: Home / Self Care | Payer: Self-pay | Source: Home / Self Care | Attending: Emergency Medicine

## 2018-09-02 DIAGNOSIS — I509 Heart failure, unspecified: Secondary | ICD-10-CM | POA: Diagnosis not present

## 2018-09-02 DIAGNOSIS — J441 Chronic obstructive pulmonary disease with (acute) exacerbation: Secondary | ICD-10-CM | POA: Diagnosis not present

## 2018-09-02 DIAGNOSIS — F17210 Nicotine dependence, cigarettes, uncomplicated: Secondary | ICD-10-CM | POA: Diagnosis not present

## 2018-09-02 DIAGNOSIS — D630 Anemia in neoplastic disease: Secondary | ICD-10-CM | POA: Insufficient documentation

## 2018-09-02 DIAGNOSIS — Z9889 Other specified postprocedural states: Secondary | ICD-10-CM

## 2018-09-02 DIAGNOSIS — Z923 Personal history of irradiation: Secondary | ICD-10-CM | POA: Diagnosis not present

## 2018-09-02 DIAGNOSIS — E785 Hyperlipidemia, unspecified: Secondary | ICD-10-CM | POA: Diagnosis not present

## 2018-09-02 DIAGNOSIS — Z7902 Long term (current) use of antithrombotics/antiplatelets: Secondary | ICD-10-CM | POA: Insufficient documentation

## 2018-09-02 DIAGNOSIS — I110 Hypertensive heart disease with heart failure: Secondary | ICD-10-CM | POA: Diagnosis not present

## 2018-09-02 DIAGNOSIS — Z79890 Hormone replacement therapy (postmenopausal): Secondary | ICD-10-CM | POA: Insufficient documentation

## 2018-09-02 DIAGNOSIS — Z7982 Long term (current) use of aspirin: Secondary | ICD-10-CM | POA: Insufficient documentation

## 2018-09-02 DIAGNOSIS — Z7952 Long term (current) use of systemic steroids: Secondary | ICD-10-CM | POA: Insufficient documentation

## 2018-09-02 DIAGNOSIS — Z79811 Long term (current) use of aromatase inhibitors: Secondary | ICD-10-CM | POA: Diagnosis not present

## 2018-09-02 DIAGNOSIS — Z419 Encounter for procedure for purposes other than remedying health state, unspecified: Secondary | ICD-10-CM

## 2018-09-02 DIAGNOSIS — E039 Hypothyroidism, unspecified: Secondary | ICD-10-CM | POA: Insufficient documentation

## 2018-09-02 DIAGNOSIS — C3412 Malignant neoplasm of upper lobe, left bronchus or lung: Secondary | ICD-10-CM | POA: Diagnosis not present

## 2018-09-02 DIAGNOSIS — R918 Other nonspecific abnormal finding of lung field: Secondary | ICD-10-CM

## 2018-09-02 DIAGNOSIS — C3411 Malignant neoplasm of upper lobe, right bronchus or lung: Secondary | ICD-10-CM | POA: Insufficient documentation

## 2018-09-02 DIAGNOSIS — Z79899 Other long term (current) drug therapy: Secondary | ICD-10-CM | POA: Insufficient documentation

## 2018-09-02 DIAGNOSIS — C50912 Malignant neoplasm of unspecified site of left female breast: Secondary | ICD-10-CM | POA: Insufficient documentation

## 2018-09-02 DIAGNOSIS — R846 Abnormal cytological findings in specimens from respiratory organs and thorax: Secondary | ICD-10-CM | POA: Diagnosis not present

## 2018-09-02 HISTORY — PX: FUDUCIAL PLACEMENT: SHX5083

## 2018-09-02 HISTORY — DX: Other nonspecific abnormal finding of lung field: R91.8

## 2018-09-02 HISTORY — PX: VIDEO BRONCHOSCOPY WITH ENDOBRONCHIAL NAVIGATION: SHX6175

## 2018-09-02 SURGERY — VIDEO BRONCHOSCOPY WITH ENDOBRONCHIAL NAVIGATION
Anesthesia: General | Site: Chest

## 2018-09-02 MED ORDER — DEXAMETHASONE SODIUM PHOSPHATE 10 MG/ML IJ SOLN
INTRAMUSCULAR | Status: DC | PRN
  Administered 2018-09-02: 4 mg via INTRAVENOUS

## 2018-09-02 MED ORDER — ONDANSETRON HCL 4 MG/2ML IJ SOLN
INTRAMUSCULAR | Status: DC | PRN
  Administered 2018-09-02: 4 mg via INTRAVENOUS

## 2018-09-02 MED ORDER — ESMOLOL HCL 100 MG/10ML IV SOLN
INTRAVENOUS | Status: AC
  Filled 2018-09-02: qty 10

## 2018-09-02 MED ORDER — EPHEDRINE SULFATE-NACL 50-0.9 MG/10ML-% IV SOSY
PREFILLED_SYRINGE | INTRAVENOUS | Status: DC | PRN
  Administered 2018-09-02: 10 mg via INTRAVENOUS
  Administered 2018-09-02 (×2): 5 mg via INTRAVENOUS

## 2018-09-02 MED ORDER — SUGAMMADEX SODIUM 200 MG/2ML IV SOLN
INTRAVENOUS | Status: DC | PRN
  Administered 2018-09-02: 80 mg via INTRAVENOUS

## 2018-09-02 MED ORDER — LIDOCAINE 2% (20 MG/ML) 5 ML SYRINGE
INTRAMUSCULAR | Status: AC
  Filled 2018-09-02: qty 5

## 2018-09-02 MED ORDER — 0.9 % SODIUM CHLORIDE (POUR BTL) OPTIME
TOPICAL | Status: DC | PRN
  Administered 2018-09-02: 1000 mL

## 2018-09-02 MED ORDER — DEXAMETHASONE SODIUM PHOSPHATE 10 MG/ML IJ SOLN
INTRAMUSCULAR | Status: AC
  Filled 2018-09-02: qty 1

## 2018-09-02 MED ORDER — SUGAMMADEX SODIUM 200 MG/2ML IV SOLN: INTRAVENOUS | Status: DC | PRN

## 2018-09-02 MED ORDER — LIDOCAINE 2% (20 MG/ML) 5 ML SYRINGE
INTRAMUSCULAR | Status: DC | PRN
  Administered 2018-09-02: 60 mg via INTRAVENOUS
  Administered 2018-09-02: 40 mg via INTRAVENOUS

## 2018-09-02 MED ORDER — FENTANYL CITRATE (PF) 250 MCG/5ML IJ SOLN
INTRAMUSCULAR | Status: AC
  Filled 2018-09-02: qty 5

## 2018-09-02 MED ORDER — LACTATED RINGERS IV SOLN
INTRAVENOUS | Status: DC
  Administered 2018-09-02 (×3): via INTRAVENOUS

## 2018-09-02 MED ORDER — CLOPIDOGREL BISULFATE 75 MG PO TABS: 75.0000 mg | ORAL_TABLET | Freq: Every day | ORAL | Status: DC

## 2018-09-02 MED ORDER — MIDAZOLAM HCL 2 MG/2ML IJ SOLN
INTRAMUSCULAR | Status: AC
  Filled 2018-09-02: qty 2

## 2018-09-02 MED ORDER — FENTANYL CITRATE (PF) 100 MCG/2ML IJ SOLN
INTRAMUSCULAR | Status: DC | PRN
  Administered 2018-09-02: 100 ug via INTRAVENOUS

## 2018-09-02 MED ORDER — ESMOLOL HCL 100 MG/10ML IV SOLN
INTRAVENOUS | Status: DC | PRN
  Administered 2018-09-02 (×3): 20 mg via INTRAVENOUS
  Administered 2018-09-02: 10 mg via INTRAVENOUS

## 2018-09-02 MED ORDER — PHENYLEPHRINE 40 MCG/ML (10ML) SYRINGE FOR IV PUSH (FOR BLOOD PRESSURE SUPPORT)
PREFILLED_SYRINGE | INTRAVENOUS | Status: DC | PRN
  Administered 2018-09-02 (×4): 80 ug via INTRAVENOUS
  Administered 2018-09-02: 120 ug via INTRAVENOUS

## 2018-09-02 MED ORDER — ROCURONIUM BROMIDE 10 MG/ML (PF) SYRINGE
PREFILLED_SYRINGE | INTRAVENOUS | Status: DC | PRN
  Administered 2018-09-02: 10 mg via INTRAVENOUS
  Administered 2018-09-02: 50 mg via INTRAVENOUS
  Administered 2018-09-02: 10 mg via INTRAVENOUS

## 2018-09-02 MED ORDER — SODIUM CHLORIDE 0.9 % IV SOLN
INTRAVENOUS | Status: DC | PRN
  Administered 2018-09-02: 30 ug/min via INTRAVENOUS

## 2018-09-02 MED ORDER — ROCURONIUM BROMIDE 50 MG/5ML IV SOSY
PREFILLED_SYRINGE | INTRAVENOUS | Status: AC
  Filled 2018-09-02: qty 5

## 2018-09-02 MED ORDER — PROPOFOL 10 MG/ML IV BOLUS
INTRAVENOUS | Status: DC | PRN
  Administered 2018-09-02: 120 mg via INTRAVENOUS

## 2018-09-02 MED ORDER — PROPOFOL 10 MG/ML IV BOLUS
INTRAVENOUS | Status: AC
  Filled 2018-09-02: qty 20

## 2018-09-02 MED ORDER — MIDAZOLAM HCL 5 MG/5ML IJ SOLN
INTRAMUSCULAR | Status: DC | PRN
  Administered 2018-09-02: 2 mg via INTRAVENOUS

## 2018-09-02 MED ORDER — ONDANSETRON HCL 4 MG/2ML IJ SOLN
INTRAMUSCULAR | Status: AC
  Filled 2018-09-02: qty 2

## 2018-09-02 SURGICAL SUPPLY — 42 items
ADAPTER BRONCHOSCOPE OLYMPUS (ADAPTER) ×3 IMPLANT
ADAPTER VALVE BIOPSY EBUS (MISCELLANEOUS) IMPLANT
ADPTR VALVE BIOPSY EBUS (MISCELLANEOUS)
BRUSH CYTOL CELLEBRITY 1.5X140 (MISCELLANEOUS) ×3 IMPLANT
BRUSH SUPERTRAX BIOPSY (INSTRUMENTS) IMPLANT
BRUSH SUPERTRAX NDL-TIP CYTO (INSTRUMENTS) ×9 IMPLANT
CANISTER SUCT 3000ML PPV (MISCELLANEOUS) ×3 IMPLANT
CHANNEL WORK EXTEND EDGE 180 (KITS) IMPLANT
CHANNEL WORK EXTEND EDGE 90 (KITS) IMPLANT
CONT SPEC 4OZ CLIKSEAL STRL BL (MISCELLANEOUS) ×6 IMPLANT
COVER BACK TABLE 60X90IN (DRAPES) ×3 IMPLANT
COVER WAND RF STERILE (DRAPES) IMPLANT
FILTER STRAW FLUID ASPIR (MISCELLANEOUS) IMPLANT
FORCEPS BIOP SUPERTRX PREMAR (INSTRUMENTS) ×9 IMPLANT
GAUZE SPONGE 4X4 12PLY STRL (GAUZE/BANDAGES/DRESSINGS) ×3 IMPLANT
GLOVE BIO SURGEON STRL SZ7.5 (GLOVE) ×6 IMPLANT
GOWN STRL REUS W/ TWL LRG LVL3 (GOWN DISPOSABLE) ×4 IMPLANT
GOWN STRL REUS W/TWL LRG LVL3 (GOWN DISPOSABLE) ×2
KIT CLEAN ENDO COMPLIANCE (KITS) ×3 IMPLANT
KIT LOCATABLE GUIDE (CANNULA) IMPLANT
KIT MARKER FIDUCIAL DELIVERY (KITS) ×3 IMPLANT
KIT PROCEDURE EDGE 180 (KITS) ×3 IMPLANT
KIT PROCEDURE EDGE 90 (KITS) IMPLANT
KIT TURNOVER KIT B (KITS) ×3 IMPLANT
MARKER FIDUCIAL SL NIT COIL (Implant Marker) ×18 IMPLANT
MARKER SKIN DUAL TIP RULER LAB (MISCELLANEOUS) ×3 IMPLANT
NEEDLE SUPERTRX PREMARK BIOPSY (NEEDLE) ×9 IMPLANT
NS IRRIG 1000ML POUR BTL (IV SOLUTION) ×3 IMPLANT
OIL SILICONE PENTAX (PARTS (SERVICE/REPAIRS)) ×3 IMPLANT
PAD ARMBOARD 7.5X6 YLW CONV (MISCELLANEOUS) ×6 IMPLANT
PATCHES PATIENT (LABEL) ×9 IMPLANT
SYR 20CC LL (SYRINGE) ×3 IMPLANT
SYR 20ML ECCENTRIC (SYRINGE) ×3 IMPLANT
SYR 50ML SLIP (SYRINGE) IMPLANT
TOWEL OR 17X24 6PK STRL BLUE (TOWEL DISPOSABLE) ×3 IMPLANT
TRAP SPECIMEN MUCOUS 40CC (MISCELLANEOUS) IMPLANT
TUBE CONNECTING 20X1/4 (TUBING) ×3 IMPLANT
UNDERPAD 30X30 (UNDERPADS AND DIAPERS) ×3 IMPLANT
VALVE BIOPSY  SINGLE USE (MISCELLANEOUS) ×1
VALVE BIOPSY SINGLE USE (MISCELLANEOUS) ×2 IMPLANT
VALVE SUCTION BRONCHIO DISP (MISCELLANEOUS) ×3 IMPLANT
WATER STERILE IRR 1000ML POUR (IV SOLUTION) ×3 IMPLANT

## 2018-09-02 NOTE — Interval H&P Note (Signed)
PCCM Interval Note  There were no vitals filed for this visit. Gen: Pleasant, thin, in no distress,  normal affect  ENT: No lesions,  mouth clear,  oropharynx clear, no postnasal drip  Neck: No JVD, no stridor  Lungs: No use of accessory muscles, no crackles or wheezing on normal respiration, no wheeze on forced expiration  Cardiovascular: RRR, heart sounds normal, no murmur or gallops, no peripheral edema  Abdomen: soft and NT, no HSM,  BS normal  Musculoskeletal: No deformities, no cyanosis or clubbing  Neuro: alert, awake, non focal  Skin: Warm, no lesions or rashes    Patient presents today for further evaluation of bilateral upper lobe pulmonary nodular disease.  She is a smoker and at risk for possible primary lung cancer.  We have planned for navigational bronchoscopy with transbronchial biopsies.  Procedure discussed in full detail including risk and benefits.  She understands.  All questions answered.  No barriers identified.  She agrees to proceed.   Baltazar Apo, MD, PhD 09/02/2018, 8:47 AM [place] [facility] [phone] or if no answer [phone]

## 2018-09-02 NOTE — Progress Notes (Signed)
Pt neighbor will be staying with pt and making arrangements for someone to stay with her for the next 24hrs. Pt wears O2 at home and acknowledges that she still needs to wear O2 at home.

## 2018-09-02 NOTE — Anesthesia Postprocedure Evaluation (Signed)
Anesthesia Post Note  Patient: STEPHANINE REAS  Procedure(s) Performed: VIDEO BRONCHOSCOPY WITH ENDOBRONCHIAL NAVIGATION (N/A ) Placement Of Fiducial to right upper lobe & left upper lobe lung (Bilateral Chest)     Patient location during evaluation: PACU Anesthesia Type: General Level of consciousness: awake and alert Pain management: pain level controlled Vital Signs Assessment: post-procedure vital signs reviewed and stable Respiratory status: spontaneous breathing, nonlabored ventilation and respiratory function stable Cardiovascular status: blood pressure returned to baseline and stable Postop Assessment: no apparent nausea or vomiting Anesthetic complications: no    Last Vitals:  Vitals:   09/02/18 1300 09/02/18 1305  BP:  99/63  Pulse: 77 77  Resp: (!) 24 (!) 24  Temp:    SpO2: 95% 90%    Last Pain:  Vitals:   09/02/18 1300  TempSrc:   PainSc: 0-No pain                 Lynda Rainwater

## 2018-09-02 NOTE — Op Note (Signed)
Video Bronchoscopy with Electromagnetic Navigation Procedure Note  Date of Operation: 09/02/2018  Pre-op Diagnosis: Bilateral upper lobe pulmonary nodules  Post-op Diagnosis: Same  Surgeon: Baltazar Apo  Assistants: None  Anesthesia: General endotracheal anesthesia  Operation: Flexible video fiberoptic bronchoscopy with electromagnetic navigation and biopsies.  Estimated Blood Loss: Minimal  Complications: None apparent  Indications and History: Kendra Watts is a 73 y.o. female with history of tobacco use, COPD.  She was found to have bilateral upper lobe asymptomatic pulmonary nodules that were hypermetabolic on PET scan.  Recommendation was made to achieve a tissue diagnosis via navigational bronchoscopy with biopsies.  The risks, benefits, complications, treatment options and expected outcomes were discussed with the patient.  The possibilities of pneumothorax, pneumonia, reaction to medication, pulmonary aspiration, perforation of a viscus, bleeding, failure to diagnose a condition and creating a complication requiring transfusion or operation were discussed with the patient who freely signed the consent.    Description of Procedure: The patient was seen in the Preoperative Area, was examined and was deemed appropriate to proceed.  The patient was taken to OR 10, identified as Kendra Watts and the procedure verified as Flexible Video Fiberoptic Bronchoscopy.  A Time Out was held and the above information confirmed.   Prior to the date of the procedure a high-resolution CT scan of the chest was performed. Utilizing [place] a virtual tracheobronchial tree was generated to allow the creation of distinct navigation pathways to the patient's parenchymal abnormalities. After being taken to the operating room general anesthesia was initiated and the patient  was orally intubated. The video fiberoptic bronchoscope was introduced via the endotracheal tube and a general  inspection was performed which showed normal airways throughout.  There were no endobronchial lesions or abnormal secretions. The extendable working channel and locator guide were introduced into the bronchoscope. The distinct navigation pathways prepared prior to this procedure were then utilized to navigate to within 0.5-1.2 cm of patient's lesions identified on CT scan. The extendable working channel was secured into place and the locator guide was withdrawn. Under fluoroscopic guidance transbronchial needle brushings, transbronchial Wang needle biopsies, and transbronchial forceps biopsies were performed to be sent for cytology and pathology.  3 fiducial markers were placed triangulating each pulmonary nodule (left upper lobe and right upper lobe) to facilitate possible stereotactic radiation should it be indicated going forward.  At the end of the procedure a general airway inspection was performed and there was no evidence of active bleeding. The bronchoscope was removed.  The patient tolerated the procedure well. There was no significant blood loss and there were no obvious complications. A post-procedural chest x-ray is pending.  Samples: 1. Transbronchial needle brushings from left upper lobe nodule 2. Transbronchial Wang needle biopsies from left upper lobe nodule 3. Transbronchial forceps biopsies from left upper lobe nodule 4. Transbronchial needle brushings from right upper lobe nodule 5. Transbronchial forceps biopsies from right upper lobe nodule   Plans:  The patient will be discharged from the PACU to home when recovered from anesthesia and after chest x-ray is reviewed. We will review the cytology, pathology results with the patient when they become available. Outpatient followup will be with Dr Lamonte Sakai.    Baltazar Apo, MD, PhD 09/02/2018, 12:20 PM [place] [facility] [phone] or if no answer [phone]

## 2018-09-02 NOTE — Anesthesia Procedure Notes (Signed)
Procedure Name: Intubation Performed by: Milford Cage, CRNA Pre-anesthesia Checklist: Patient identified, Emergency Drugs available, Suction available and Patient being monitored Patient Re-evaluated:Patient Re-evaluated prior to induction Oxygen Delivery Method: Circle System Utilized Preoxygenation: Pre-oxygenation with 100% oxygen Induction Type: IV induction Ventilation: Mask ventilation without difficulty Laryngoscope Size: Mac and 3 Grade View: Grade I Tube type: Oral Tube size: 8.0 mm Number of attempts: 1 Airway Equipment and Method: Stylet and Oral airway Placement Confirmation: ETT inserted through vocal cords under direct vision,  positive ETCO2 and breath sounds checked- equal and bilateral Secured at: 21 cm Tube secured with: Tape Dental Injury: Teeth and Oropharynx as per pre-operative assessment

## 2018-09-02 NOTE — Transfer of Care (Signed)
Immediate Anesthesia Transfer of Care Note  Patient: Kendra Watts  Procedure(s) Performed: VIDEO BRONCHOSCOPY WITH ENDOBRONCHIAL NAVIGATION (N/A ) Placement Of Fiducial to right upper lobe & left upper lobe lung (Bilateral Chest)  Patient Location: PACU  Anesthesia Type:General  Level of Consciousness: awake  Airway & Oxygen Therapy: Patient Spontanous Breathing and Patient connected to nasal cannula oxygen  Post-op Assessment: Report given to RN and Post -op Vital signs reviewed and stable  Post vital signs: Reviewed and stable  Last Vitals:  Vitals Value Taken Time  BP 113/98 09/02/2018 12:21 PM  Temp    Pulse 84 09/02/2018 12:22 PM  Resp 20 09/02/2018 12:22 PM  SpO2 91% 09/02/2018 12:22 PM  Vitals shown include unvalidated device data.  Last Pain:  Vitals:   09/02/18 0853  TempSrc: Oral  PainSc: 0-No pain      Patients Stated Pain Goal: 4 (64/84/72 0721)  Complications: No apparent anesthesia complications

## 2018-09-02 NOTE — Discharge Instructions (Signed)
Flexible Bronchoscopy, Care After This sheet gives you information about how to care for yourself after your test. Your doctor may also give you more specific instructions. If you have problems or questions, contact your doctor. Follow these instructions at home: Eating and drinking  Do not eat or drink anything (not even water) for 2 hours after your test, or until your numbing medicine (local anesthetic) wears off.  When your numbness is gone and your cough and gag reflexes have come back, you may: ? Eat only soft foods. ? Slowly drink liquids.  The day after the test, go back to your normal diet. Driving  Do not drive for 24 hours if you were given a medicine to help you relax (sedative).  Do not drive or use heavy machinery while taking prescription pain medicine. General instructions   Take over-the-counter and prescription medicines only as told by your doctor.  Return to your normal activities as told. Ask what activities are safe for you.  Do not use any products that have nicotine or tobacco in them. This includes cigarettes and e-cigarettes. If you need help quitting, ask your doctor.  Keep all follow-up visits as told by your doctor. This is important. It is very important if you had a tissue sample (biopsy) taken. Get help right away if:  You have shortness of breath that gets worse.  You get light-headed.  You feel like you are going to pass out (faint).  You have chest pain.  You cough up: ? More than a little blood. ? More blood than before. Summary  Do not eat or drink anything (not even water) for 2 hours after your test, or until your numbing medicine wears off.  Do not use cigarettes. Do not use e-cigarettes.  Get help right away if you have chest pain.  Please call our office for any questions or concerns.  [phone].  This information is not intended to replace advice given to you by your health care provider. Make sure you discuss any  questions you have with your health care provider. Document Released: 05/05/2009 Document Revised: 07/26/2016 Document Reviewed: 07/26/2016 Elsevier Interactive Patient Education  2019 [employer].

## 2018-09-03 ENCOUNTER — Telehealth: Payer: Self-pay | Admitting: Emergency Medicine

## 2018-09-03 ENCOUNTER — Encounter (HOSPITAL_COMMUNITY): Payer: Self-pay | Admitting: Emergency Medicine

## 2018-09-03 DIAGNOSIS — C3490 Malignant neoplasm of unspecified part of unspecified bronchus or lung: Secondary | ICD-10-CM

## 2018-09-03 NOTE — Telephone Encounter (Signed)
Spoke with pt. She is aware of Beth's response. Nothing further was needed.

## 2018-09-03 NOTE — Telephone Encounter (Signed)
This is a normal finding after a bronchoscopy. Does not sound alarming at this time. Monitor it, if continues or worsens come in for visit. If she develops pain present to ED. Advise NO Asprin or NSAIDs.

## 2018-09-03 NOTE — Telephone Encounter (Signed)
Spoke with pt, states she had a bronch yesterday, and this morning has been coughing up mucus tinged with bright red blood.  States that this is more mucus than blood.  States she has a hx of nosebleeds, unsure if this is coming from her chest or her nose/sinuses.    Pt notes that she is on Plavix, but has been off of it X5 days.    Denies fever, chest pain, unusual SOB.    Pt wants to make our office aware of recs, see if this is normal, and if anything else can be recommended for her.  Pharmacy:  [facility] on [address].    Sending to app of the morning as RB is in hospital.  Beth please advise.  Thanks!

## 2018-09-03 NOTE — Telephone Encounter (Signed)
Spoke with pt. She has been advised when to start her ASA and Plavix back. Nothing further was needed.

## 2018-09-03 NOTE — Telephone Encounter (Signed)
I reviewed the cytology results with the patient. 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 Discussed referral to to Oncology. She has seen Dr [name] for her prior breast CA, wants to go to the [facility] first to address this diagnosis. Needs MRI Brain, will order this.

## 2018-09-07 ENCOUNTER — Ambulatory Visit (HOSPITAL_COMMUNITY)
Admission: RE | Admit: 2018-09-07 | Discharge: 2018-09-07 | Disposition: A | Discharge status: Home / Self Care | Payer: Medicare Other | Source: Ambulatory Visit | Attending: Emergency Medicine | Admitting: Emergency Medicine

## 2018-09-07 ENCOUNTER — Other Ambulatory Visit: Payer: Self-pay

## 2018-09-07 ENCOUNTER — Other Ambulatory Visit: Payer: Self-pay | Admitting: General Surgery

## 2018-09-07 ENCOUNTER — Telehealth: Payer: Self-pay | Admitting: Emergency Medicine

## 2018-09-07 ENCOUNTER — Telehealth: Payer: Self-pay | Admitting: Hematology and Oncology

## 2018-09-07 ENCOUNTER — Encounter (HOSPITAL_COMMUNITY): Payer: Self-pay

## 2018-09-07 DIAGNOSIS — Z853 Personal history of malignant neoplasm of breast: Secondary | ICD-10-CM

## 2018-09-07 DIAGNOSIS — C3490 Malignant neoplasm of unspecified part of unspecified bronchus or lung: Secondary | ICD-10-CM | POA: Insufficient documentation

## 2018-09-07 MED ORDER — GADOBUTROL 1 MMOL/ML IV SOLN
4.0000 mL | Freq: Once | INTRAVENOUS | Status: AC | PRN
  Administered 2018-09-07: 4 mL via INTRAVENOUS

## 2018-09-07 NOTE — Telephone Encounter (Signed)
Left message for patient to call back  

## 2018-09-07 NOTE — Telephone Encounter (Signed)
Scheduled appt per 2/17 sch message - pt is aware of apt date an time

## 2018-09-07 NOTE — Telephone Encounter (Signed)
Please let her know that I would expect the bleeding to be stopping in the next 1-2 days. If not I may have to hold her plavix for a few days longer. Have her call us to let us know.

## 2018-09-07 NOTE — Telephone Encounter (Signed)
Spoke with patient. She was calling back to state that she had a bronch last week on 09/02/18. She is still experiencing a productive cough with small streaks of blood after her procedure. She restarted her Plavix on Friday. Denies any SOB, chest pain or sore throat. She also stated that she had a nose bleed on Saturday night that caused her to call EMS. She stated that she was advised to call back if she was still having issues.   RB, please advise. Thanks!

## 2018-09-08 NOTE — Telephone Encounter (Signed)
ATC pt, no answer and I could not leave a message. Will try back.

## 2018-09-08 NOTE — Telephone Encounter (Signed)
Spoke with pt. She is aware of RB's response. Nothing further was needed.

## 2018-09-08 NOTE — Telephone Encounter (Signed)
Pt is calling back [phone]

## 2018-09-09 ENCOUNTER — Ambulatory Visit
Admission: RE | Admit: 2018-09-09 | Discharge: 2018-09-09 | Disposition: A | Discharge status: Home / Self Care | Payer: Medicare Other | Source: Ambulatory Visit | Attending: General Surgery | Admitting: General Surgery

## 2018-09-09 DIAGNOSIS — Z853 Personal history of malignant neoplasm of breast: Secondary | ICD-10-CM

## 2018-09-09 DIAGNOSIS — R922 Inconclusive mammogram: Secondary | ICD-10-CM | POA: Diagnosis not present

## 2018-09-10 ENCOUNTER — Inpatient Hospital Stay: Payer: Medicare Other | Admitting: Hematology and Oncology

## 2018-09-10 DIAGNOSIS — C3491 Malignant neoplasm of unspecified part of right bronchus or lung: Secondary | ICD-10-CM | POA: Insufficient documentation

## 2018-09-10 DIAGNOSIS — C3492 Malignant neoplasm of unspecified part of left bronchus or lung: Secondary | ICD-10-CM | POA: Insufficient documentation

## 2018-09-10 NOTE — Assessment & Plan Note (Signed)
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 I will discuss the case with thoracic surgery to see if she is a candidate for resection.

## 2018-09-11 ENCOUNTER — Telehealth: Payer: Self-pay | Admitting: Emergency Medicine

## 2018-09-11 NOTE — Telephone Encounter (Signed)
Called pt and advised message from the provider. Pt understood and verbalized understanding. Nothing further is needed.    

## 2018-09-11 NOTE — Telephone Encounter (Signed)
Please ask her to stop her plavix (if she has not already) until I see her next week. If the nose-bleeding continues I may need to have her see ENT. She has had trouble with refractory nose bleeding before.

## 2018-09-11 NOTE — Telephone Encounter (Signed)
Spoke with pt, states that pt is still coughing up blood after 2/12 bronch.  States that cough has decreased in frequency, and blood had also decreased in frequency, but is still present.  Pt states that it is varying in color from bright to dark red in color, and this is tinged in her mucus.  Also notes that she experienced a nosebleed last night.  Requesting RB's recs- has an appt on Monday with RB at 2:00.  RB please advise.  Thanks.

## 2018-09-14 ENCOUNTER — Ambulatory Visit (INDEPENDENT_AMBULATORY_CARE_PROVIDER_SITE_OTHER): Payer: Medicare Other | Admitting: Emergency Medicine

## 2018-09-14 ENCOUNTER — Encounter: Payer: Self-pay | Admitting: Emergency Medicine

## 2018-09-14 DIAGNOSIS — R042 Hemoptysis: Secondary | ICD-10-CM

## 2018-09-14 DIAGNOSIS — C3492 Malignant neoplasm of unspecified part of left bronchus or lung: Secondary | ICD-10-CM

## 2018-09-14 DIAGNOSIS — C3491 Malignant neoplasm of unspecified part of right bronchus or lung: Secondary | ICD-10-CM | POA: Diagnosis not present

## 2018-09-14 NOTE — Assessment & Plan Note (Signed)
Following with Dr [name] as above

## 2018-09-14 NOTE — Patient Instructions (Signed)
Please stay off of Plavix for now.  We will need to discuss the pros and cons of being on this medication with Dr. Constance Holster and Dr. Gwenlyn Found.  Plan follow-up with Dr. Constance Holster to discuss the continued nosebleeding Follow-up with Dr. Gwenlyn Found regarding your vascular disease Please keep your appointment with [name] to help plan therapy for your right upper lobe and left upper lobe stage I lung cancers. Continue Anoro once daily for now. Please get your inhaler formulary from your insurance company and send it to our office so that we can review.  There may be a more cost effective substitute for Anoro that we could prescribe. Work on stopping smoking Follow with Dr Lamonte Sakai in 3 months or sooner if you have any problems.

## 2018-09-14 NOTE — Assessment & Plan Note (Signed)
This has continued since the bronchoscopy.  In retrospect I believe that it relates to her chronic epistaxis which is flaring.  I asked her to stop her Plavix on 2/21 and there may have been some interval improvement.  She is still seeing blood out of her nose and in her sputum.  I hesitate to have her restart the Plavix right now.  I think she needs to see ENT.  We will also need to weigh the risk and benefits along with Dr. Gwenlyn Found

## 2018-09-14 NOTE — Progress Notes (Signed)
Subjective:    Patient ID: Kendra Watts, female    DOB: 1945/09/05, 73 y.o.   MRN: [id number]  HPI 73 year old smoker (60 pack years) with a history of L breast cancer (2017) treated with lumpectomy and radiation therapy, renal artery stenosis, hypothyroidism, hypertension, anemia, COPD with severe obstruction noted on pulmonary function testing from 05/14/2017 (FEV1 0.98 L, 52% predicted).  She was admitted to the hospital in late December with an apparent acute exacerbation of her COPD.  She had a CT scan of the chest done on 12/16 during that admission that showed an irregular 2.7 cm nodular focus in the posterior left upper lobe with some surrounding groundglass opacity, a spiculated 1.8 cm solid apical right upper lobe pulmonary nodule.  A subsequent PET scan was done on 07/30/2018 that I have reviewed.  This shows that both the right upper lobe spiculated nodule and the posterior left upper lobe irregular nodule remain, are hypermetabolic.  She is here today for further evaluation of the abnormalities on CT.   Current bronchodilator therapy is Anoro.  She uses albuterol approximately.  She is on Plavix and aspirin 81 mg. She was sent home from hospital with O2. She has been active, walks her dog. Able to do her ADL and shopping.   73/24/2020 --follow-up visit for patient with a history of tobacco, left-sided breast cancer (treated) RAS, hypothyroidism, COPD with severe obstruction on pulmonary function testing.  I took her for navigational bronchoscopy on 2/12 to better evaluate bilateral upper lobe pulmonary nodules that were hypermetabolic on PET scan.  Biopsies revealed squamous cell on the left and adenocarcinoma on the right.  Fiducials were placed around each.  I had her restart her Plavix a few days post procedure, but she has continued to have some hemoptysis so I asked her to stop it again on 2/21. She is having some blood from her nose, had to call EMS for this 2/21, but got it under  control. Has been worked on before by ENT.   She denies any significant SOB, can happen w heavier exertion. She is on Anoro - has been benefiting from it. She is smoking about   MRI brain from 09/08/2018 does not show any evidence of metastatic disease. She has an appt 09/21/18 with Dr [name].     Review of Systems   Past Medical History:  Diagnosis Date  . Anemia   . 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 Breast cancer ([place]) 09/22/2015   Malignant  . Breast cancer of upper-outer quadrant of left female breast ([place]) 09/08/2015  . Cataract, immature   . CHF (congestive heart failure) ([place])    Acute CHF-06/2018  . COPD (chronic obstructive pulmonary disease) ([place])   . Heart murmur    states no known problems  . History of hyperthyroidism   . Hyperlipidemia   . Hypertension    states under control with meds., has been on med. x "long time"  . Hypokalemia    from last physical.   . Hypothyroidism   . Nonfunctioning kidney    left  . Personal history of radiation therapy    2017  . Pulmonary nodules    Bilateral  . Radiation 10/30/15-11/28/15   left breast 42.72 Gy, boosted to 10 Gy  . Renal artery stenosis ([employer])   . Tobacco abuse   . Wears partial dentures    upper and lower     Family History  Problem Relation Age of  Onset  . Heart disease Mother        MI @ 95, died at 29  . Cancer Father   . Lung cancer Father   . Heart disease Maternal Grandmother   . Lung cancer Sister   . Breast cancer Paternal Aunt      Social History   Socioeconomic History  . Marital status: Divorced    Spouse name: Not on file  . Number of children: 0  . Years of education: Not on file  . Highest education level: Not on file  Occupational History  . 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 Financial resource strain: Not on file  . Food insecurity:    Worry: Not on file    Inability: Not on file  . Transportation needs:    Medical: Not on  file    Non-medical: Not on file  Tobacco Use  . Smoking status: Current Every Day Smoker    Packs/day: 1.50    Years: 40.00    Pack years: 60.00    Types: Cigarettes  . Smokeless tobacco: Never Used  . Tobacco comment: as of 09/14/2018, less than 1 pack/day  Substance and Sexual Activity  . Alcohol use: Yes    Comment: occasionally  . Drug use: No  . Sexual activity: Never  Lifestyle  . Physical activity:    Days per week: Not on file    Minutes per session: Not on file  . Stress: Not on file  Relationships  . Social connections:    Talks on phone: Not on file    Gets together: Not on file    Attends religious service: Not on file    Active member of club or organization: Not on file    Attends meetings of clubs or organizations: Not on file    Relationship status: Not on file  . Intimate partner violence:    Fear of current or ex partner: Not on file    Emotionally abused: Not on file    Physically abused: Not on file    Forced sexual activity: Not on file  Other Topics Concern  . Not on file  Social History Narrative  . Not on file     No Known Allergies   Outpatient Medications Prior to Visit  Medication Sig Dispense Refill  . aspirin 81 MG tablet Take 81 mg by mouth daily.    . Calcium-Magnesium-Vitamin D (CALCIUM 1200+D3 PO) Take 1 tablet by mouth daily.    . clopidogrel (PLAVIX) 75 MG tablet Take 1 tablet (75 mg total) by mouth daily. OK to restart on 09/04/2018    . doxazosin (CARDURA) 8 MG tablet Take 8 mg by mouth daily.     . ferrous sulfate 325 (65 FE) MG tablet Take 1 tablet (325 mg total) by mouth 2 (two) times daily with a meal. 60 tablet 0  . furosemide (LASIX) 20 MG tablet Take 1 tablet (20 mg total) by mouth daily. 30 tablet 0  . levothyroxine (SYNTHROID, LEVOTHROID) 88 MCG tablet Take 88 mcg by mouth daily before breakfast.    . Multiple Vitamins-Minerals (MULTIVITAMIN WITH MINERALS) tablet Take 1 tablet by mouth daily.     . Naphazoline HCl (CLEAR  EYES OP) Place 2 drops into both eyes daily.    . pantoprazole (PROTONIX) 40 MG tablet Take 1 tablet (40 mg total) by mouth daily. 30 tablet 0  . rosuvastatin (CRESTOR) 10 MG tablet Take 10 mg by  mouth daily.     . tamoxifen (NOLVADEX) 20 MG tablet TAKE 1 TABLET BY MOUTH  DAILY (Patient taking differently: Take 20 mg by mouth daily. ) 90 tablet 3  . umeclidinium-vilanterol (ANORO ELLIPTA) 62.5-25 MCG/INH AEPB Inhale 1 puff into the lungs daily.     No facility-administered medications prior to visit.         Objective:   Physical Exam Vitals:   09/14/18 1337  BP: 100/72  Pulse: (!) 113  SpO2: 95%  Weight: 91 lb 9.6 oz (41.5 kg)  Height: 5' (1.524 m)   Gen: Pleasant, thin, in no distress,  normal affect  ENT: No lesions,  mouth clear,  oropharynx clear, nares not examined  Neck: No JVD, no stridor  Lungs: No use of accessory muscles, distant, no crackles or wheezing on normal respiration, no wheeze on forced expiration  Cardiovascular: RRR, heart sounds normal, no murmur or gallops, no peripheral edema  Musculoskeletal: No deformities, no cyanosis or clubbing  Neuro: alert, awake, non focal  Skin: Warm, no lesions or rash      Assessment & Plan:  Hemoptysis This has continued since the bronchoscopy.  In retrospect I believe that it relates to her chronic epistaxis which is flaring.  I asked her to stop her Plavix on 2/21 and there may have been some interval improvement.  She is still seeing blood out of her nose and in her sputum.  I hesitate to have her restart the Plavix right now.  I think she needs to see ENT.  We will also need to weigh the risk and benefits along with Dr. Gwenlyn Found  Squamous cell lung cancer, left [facility]) New diagnosis on bronchoscopy, fiducials placed to facilitate radiation therapy if this is possible (she has a history of breast cancer and previous radiation)  Adenocarcinoma, lung, right [facility]) Following with Dr [name] as above  Baltazar Apo, MD,  PhD 09/14/2018, 2:12 PM [facility] [phone] or if no answer [phone]

## 2018-09-14 NOTE — Assessment & Plan Note (Signed)
New diagnosis on bronchoscopy, fiducials placed to facilitate radiation therapy if this is possible (she has a history of breast cancer and previous radiation)

## 2018-09-17 ENCOUNTER — Encounter: Payer: Self-pay | Admitting: *Deleted

## 2018-09-17 ENCOUNTER — Ambulatory Visit (HOSPITAL_COMMUNITY)
Admission: RE | Admit: 2018-09-17 | Discharge: 2018-09-17 | Disposition: A | Discharge status: Home / Self Care | Payer: Medicare Other | Source: Ambulatory Visit | Attending: Cardiovascular Disease | Admitting: Cardiovascular Disease

## 2018-09-17 DIAGNOSIS — I701 Atherosclerosis of renal artery: Secondary | ICD-10-CM | POA: Diagnosis not present

## 2018-09-17 DIAGNOSIS — C3492 Malignant neoplasm of unspecified part of left bronchus or lung: Secondary | ICD-10-CM

## 2018-09-17 NOTE — Progress Notes (Signed)
Oncology Nurse Navigator Documentation  Oncology Nurse Navigator Flowsheets 09/17/2018  Navigator Location [facility]  Navigator Encounter Type Other/per cancer conference discussion and [name], patient needs to be referred to Rad Onc for SBRT to both lung lesions.  I completed referral to rad onc and will update their team.  I called patient's home and cell phone to update her on referral for Rad Onc but could not reach her or leave a message.   Confirmed Diagnosis Date 09/02/2018  Treatment Phase Pre-Tx/Tx Discussion  Barriers/Navigation Needs Coordination of Care  Interventions Coordination of Care  Coordination of Care Other  Acuity Level 2  Time Spent with Patient 30

## 2018-09-21 ENCOUNTER — Inpatient Hospital Stay: Payer: Medicare Other | Attending: Hematology and Oncology | Admitting: Hematology and Oncology

## 2018-09-21 DIAGNOSIS — Z87891 Personal history of nicotine dependence: Secondary | ICD-10-CM

## 2018-09-21 DIAGNOSIS — Z170 Estrogen receptor positive status [ER+]: Secondary | ICD-10-CM

## 2018-09-21 DIAGNOSIS — Z923 Personal history of irradiation: Secondary | ICD-10-CM

## 2018-09-21 DIAGNOSIS — C50412 Malignant neoplasm of upper-outer quadrant of left female breast: Secondary | ICD-10-CM | POA: Diagnosis not present

## 2018-09-21 DIAGNOSIS — Z79810 Long term (current) use of selective estrogen receptor modulators (SERMs): Secondary | ICD-10-CM | POA: Diagnosis not present

## 2018-09-21 DIAGNOSIS — Z7982 Long term (current) use of aspirin: Secondary | ICD-10-CM | POA: Diagnosis not present

## 2018-09-21 DIAGNOSIS — Z79899 Other long term (current) drug therapy: Secondary | ICD-10-CM | POA: Diagnosis not present

## 2018-09-21 DIAGNOSIS — C3492 Malignant neoplasm of unspecified part of left bronchus or lung: Secondary | ICD-10-CM | POA: Insufficient documentation

## 2018-09-21 NOTE — Progress Notes (Signed)
Thoracic Location of Tumor / Histology:  right upper lobe nodule 1.2 x 1.1 cm SUV 7.6 biopsy revealed adenocarcinoma positive for TTF-1 and Napsin A, left upper lobe nodule 2.5 x 2.1 cm SUV 9.8, no lymph node hypermetabolism, 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 and TTF-1 is negative   Patient presented with symptoms of: Per Dr. Ander Slade 07/11/18:  Came into the hospital with exertional shortness of breath of a few days duration Recently had abnormal labs low sodium, low hematocrit Worsening shortness of breath led to presentation to the hospital Evaluation so far did reveal the abnormal CT scan of the chest leading to this consultation  Biopsies revealed: 09/02/18: Diagnosis TRANSBRONCHIAL NEEDLE ASPIRATION NAVIGATION, SPECIMEN C, RUL BRUSHINGS (SPECIMEN 3 OF 3, COLLECTED ON 09/02/2018): MALIGNANT CELLS PRESENT Diagnosis 1. Lung, transbronchial biopsy, Left upper lobe - SQUAMOUS CELL CARCINOMA, SEE NOTE. 2. Lung, biopsy, Right Upper - ADENOCARCINOMA. SEE NOTE. Diagnosis TRANSBRONCHIAL NEEDLE ASPIRATION, (B), NAVIGATION, LUL NODULE NEEDLE ASPIRATION (SPECIMEN 2 OF 3 COLLECTED 09-02-2018) ATYPICAL CELLS PRESENT Diagnosis TRANSBRONCHIAL NEEDLE ASPIRATION, NAVIGATION, LUL BRUSHINGS, A (SPECIMEN 1 OF 3,COLLECTED 09/02/18): MALIGNANT CELLS CONSISTENT WITH NON-SMALL CELL CARCINOMA.  Tobacco/Marijuana/Snuff/ETOH use:  Tobacco Use  . Smoking status: Current Every Day Smoker    Packs/day: 1.50    Years: 40.00    Pack years: 60.00    Types: Cigarettes  . Smokeless tobacco: Never Used  Substance and Sexual Activity  . Alcohol use: Yes    Comment: occasionally  . Drug use: No     Past/Anticipated interventions by cardiothoracic surgery, if any: None at this time.  Past/Anticipated interventions by medical oncology, if any: Per [name] 09/02/18: 09/02/2018: Lung nodule evaluation of her former smoker with shortness of breath during hospitalization, PET  CT, right upper lobe nodule 1.2 x 1.1 cm SUV 7.6 biopsy revealed adenocarcinoma positive for TTF-1 and Napsin A, left upper lobe nodule 2.5 x 2.1 cm SUV 9.8, no lymph node hypermetabolism, 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 and TTF-1 is negative  Pathology and radiology review: I discussed with the patient that she has bilateral stage I lung cancers. I will discuss the case with thoracic surgery to see if she is a candidate for resection.   Signs/Symptoms  Weight changes, if any: No  Respiratory complaints, if any: pt with a cough, clear/yellow sputum. Pt has scant hemoptysis at this time. Pt denies SOB.  Hemoptysis, if any: She is still seeing blood out of her nose and in her sputum.  Pain issues, if any:  Pt denies c/o pain.  SAFETY ISSUES: Prior radiation? Radiation treatment dates:   10/30/2015-11/28/2015  Site/dose:    1. The Left breast was treated to 42.72 Gy in 16 fractions at 2.67 Gy per fraction.  2. The Left breast was boosted to 10 Gy in 5 fractions at 2 Gy per fraction.  Pacemaker/ICD? No  Possible current pregnancy? No  Is the patient on methotrexate? No  Current Complaints / other details:  Pt presents today for re-consult with Dr. Sondra Come for Radiation Oncology. Pt is unaccompanied.   BP 103/79 (BP Location: Right Arm, Patient Position: Sitting)   Pulse (!) 104   Temp 98.2 F (36.8 C) (Oral)   Resp 20   Ht 5' (1.524 m)   Wt 97 lb 3.2 oz (44.1 kg)   SpO2 97%   BMI 18.98 kg/m   Wt Readings from Last 3 Encounters:  09/28/18 97 lb 3.2 oz (44.1 kg)  09/21/18 91 lb (41.3 kg)  09/14/18 91 lb 9.6 oz (41.5 kg)   Loma Sousa, RN BSN     Tumor board recommendation: Stereotactic radiosurgery to the stage I lung cancers. Followed by observation.

## 2018-09-21 NOTE — Assessment & Plan Note (Signed)
09/02/2018: Lung nodule evaluation of her former smoker with shortness of breath during hospitalization, PET CT, right upper lobe nodule 1.2 x 1.1 cm SUV 7.6 biopsy revealed adenocarcinoma positive for TTF-1 and Napsin A, left upper lobe nodule 2.5 x 2.1 cm SUV 9.8, no lymph node hypermetabolism, 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 and TTF-1 is negative  With bilateral breast cancers and both sides showing different histologies, patient has bilateral stage I lung cancer.  Tumor board recommendation: Stereotactic radiosurgery to the stage I lung cancers. Followed by observation.

## 2018-09-21 NOTE — Progress Notes (Signed)
Patient Care Team: Kendra Seashore, MD as PCP - General (Internal Medicine) Kendra Harp, MD as Attending Physician (Cardiology) Kendra Banister, MD (Gastroenterology) Kendra Posey, MD as Consulting Physician (Obstetrics and Gynecology) Kendra Cheese, NP as Nurse Practitioner (Hematology and Oncology)  DIAGNOSIS:  Encounter Diagnoses  Name Primary?  . Squamous cell lung cancer, left ([place])   . Malignant neoplasm of upper-outer quadrant of left breast in female, estrogen receptor positive ([place])     SUMMARY OF ONCOLOGIC HISTORY:   Breast cancer of upper-outer quadrant of left female breast ([place])   08/30/2015 Imaging    DEXA scan: Normal     09/07/2015 Initial Diagnosis    Screening detected left breast mass at 1:00 position 4 x 3 x 4 mm biopsy grade 1 invasive ductal carcinoma ER 100% PR 90% positive HER-2 negative ratio 1.18 Ki-67 10%, T1 aN0 stage IA clinical stage    09/22/2015 Surgery    Left lumpectomy Kendra Watts): IDC grade 1, 1.1 minutes, ADH, LCIS, margins negative, 0/3 lymph nodes, ER 100%, PR 90%, HER-2 negative, Ki-67 10%, T1 cN0 stage IA, Oncotype DX score 14, 9% ROR    10/30/2015 - 11/28/2015 Radiation Therapy    Adjuvant XRT (Kendra Watts). Left breast: 42.72 Gy in 16 treatments.  Left breast "boost": 10 Gy in 5 treatments.  Total: 52.72 Gy in 21 treatments     11/2015 -  Anti-estrogen oral therapy    Tamoxifen '20mg'$  daily. Planned duration of treatment: 5 years (Kendra Watts)     Squamous cell lung cancer, left ([place])   09/10/2018 Initial Diagnosis    Squamous cell lung cancer, left ([place])    09/10/2018 Cancer Staging    Staging form: Lung, AJCC 8th Edition - Clinical stage from 09/10/2018: cT1, cN0, cM0 - Signed by Kendra Lose, MD on 09/10/2018     Adenocarcinoma, lung, right ([place])   09/02/2018 Initial Diagnosis    Lung nodule evaluation of her former smoker with shortness of breath during hospitalization, PET CT, right upper lobe nodule 1.2 x 1.1 cm SUV 7.6  biopsy revealed adenocarcinoma positive for TTF-1 and Napsin A, left upper lobe nodule 2.5 x 2.1 cm SUV 9.8, no lymph node hypermetabolism, 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 and TTF-1 is negative    09/10/2018 Cancer Staging    Staging form: Lung, AJCC 8th Edition - Clinical stage from 09/10/2018: cT1, cN0, cM0 - Signed by Kendra Lose, MD on 09/10/2018     CHIEF COMPLIANT: Follow-up to discuss her treatment plan for lung cancer  INTERVAL HISTORY: Kendra Watts is a 73 year old with above-mentioned history of breast cancer who presented with the bilateral lung cancers that are of 2 different histologies.  Squamous cell cancer in the right lung and adenocarcinoma the left lung.  She was presented at the thoracic tumor board and the recommendation was for her to undergo radiation therapy.  She is going to see Kendra Watts next Monday.  REVIEW OF SYSTEMS:   Constitutional: Denies fevers, chills or abnormal weight loss Eyes: Denies blurriness of vision Ears, nose, mouth, throat, and face: Denies mucositis or sore throat Respiratory: Mild chronic shortness of breath Cardiovascular: Denies palpitation, chest discomfort Gastrointestinal:  Denies nausea, heartburn or change in bowel habits Skin: Denies abnormal skin rashes Lymphatics: Denies new lymphadenopathy or easy bruising Neurological:Denies numbness, tingling or new weaknesses Behavioral/Psych: Mood is stable, no new changes  Extremities: No lower extremity edema Breast:  denies any pain or lumps or nodules in either  breasts All other systems were reviewed with the patient and are negative.  I have reviewed the past medical history, past surgical history, social history and family history with the patient and they are unchanged from previous note.  ALLERGIES:  has No Known Allergies.  MEDICATIONS:  Current Outpatient Medications  Medication Sig Dispense Refill  . aspirin 81 MG tablet Take 81 mg by mouth  daily.    . Calcium-Magnesium-Vitamin D (CALCIUM 1200+D3 PO) Take 1 tablet by mouth daily.    Marland Kitchen doxazosin (CARDURA) 8 MG tablet Take 8 mg by mouth daily.     . furosemide (LASIX) 20 MG tablet Take 1 tablet (20 mg total) by mouth daily. 30 tablet 0  . levothyroxine (SYNTHROID, LEVOTHROID) 88 MCG tablet Take 88 mcg by mouth daily before breakfast.    . Multiple Vitamins-Minerals (MULTIVITAMIN WITH MINERALS) tablet Take 1 tablet by mouth daily.     . Naphazoline HCl (CLEAR EYES OP) Place 2 drops into both eyes daily.    . pantoprazole (PROTONIX) 40 MG tablet Take 1 tablet (40 mg total) by mouth daily. 30 tablet 0  . rosuvastatin (CRESTOR) 10 MG tablet Take 10 mg by mouth daily.     . tamoxifen (NOLVADEX) 20 MG tablet TAKE 1 TABLET BY MOUTH  DAILY (Patient taking differently: Take 20 mg by mouth daily. ) 90 tablet 3  . umeclidinium-vilanterol (ANORO ELLIPTA) 62.5-25 MCG/INH AEPB Inhale 1 puff into the lungs daily.     No current facility-administered medications for this visit.     PHYSICAL EXAMINATION: ECOG PERFORMANCE STATUS: 1 - Symptomatic but completely ambulatory  Vitals:   09/21/18 1448  BP: 120/64  Pulse: (!) 107  Resp: 17  Temp: 98.7 F (37.1 C)  SpO2: 95%   Filed Weights   09/21/18 1448  Weight: 91 lb (41.3 kg)    GENERAL:alert, no distress and comfortable SKIN: skin color, texture, turgor are normal, no rashes or significant lesions EYES: normal, Conjunctiva are pink and non-injected, sclera clear OROPHARYNX:no exudate, no erythema and lips, buccal mucosa, and tongue normal  NECK: supple, thyroid normal size, non-tender, without nodularity LYMPH:  no palpable lymphadenopathy in the cervical, axillary or inguinal LUNGS: clear to auscultation and percussion with normal breathing effort HEART: regular rate & rhythm and no murmurs and no lower extremity edema ABDOMEN:abdomen soft, non-tender and normal bowel sounds MUSCULOSKELETAL:no cyanosis of digits and no clubbing    NEURO: alert & oriented x 3 with fluent speech, no focal motor/sensory deficits EXTREMITIES: No lower extremity edema   LABORATORY DATA:  I have reviewed the data as listed CMP Latest Ref Rng & Units 08/26/2018 07/13/2018 07/12/2018  Glucose 70 - 99 mg/dL 84 86 89  BUN 8 - 23 mg/dL 21 25(H) 16  Creatinine 0.44 - 1.00 mg/dL 0.90 0.70 0.77  Sodium 135 - 145 mmol/L 141 140 140  Potassium 3.5 - 5.1 mmol/L 3.8 4.6 3.1(L)  Chloride 98 - 111 mmol/L 103 102 99  CO2 22 - 32 mmol/L '24 28 30  '$ Calcium 8.9 - 10.3 mg/dL 9.3 8.9 9.0  Total Protein 6.5 - 8.1 g/dL 6.7 - -  Total Bilirubin 0.3 - 1.2 mg/dL 0.7 - -  Alkaline Phos 38 - 126 U/L 55 - -  AST 15 - 41 U/L 24 - -  ALT 0 - 44 U/L 18 - -    Lab Results  Component Value Date   WBC 7.4 08/26/2018   HGB 12.6 08/26/2018   HCT 38.8 08/26/2018   MCV  88.2 08/26/2018   PLT 222 08/26/2018   NEUTROABS 5.8 07/13/2018    ASSESSMENT & PLAN:  Squamous cell lung cancer, left ([place]) 09/02/2018: Lung nodule evaluation of her former smoker with shortness of breath during hospitalization, PET CT, right upper lobe nodule 1.2 x 1.1 cm SUV 7.6 biopsy revealed adenocarcinoma positive for TTF-1 and Napsin A, left upper lobe nodule 2.5 x 2.1 cm SUV 9.8, no lymph node hypermetabolism, 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 and TTF-1 is negative  With bilateral breast cancers and both sides showing different histologies, patient has bilateral stage I lung cancer.  Tumor board recommendation: Stereotactic radiosurgery to the stage I lung cancers. Followed by observation.  Breast cancer of upper-outer quadrant of left female breast ([place]) In remission. Mammograms 09/09/2018: Benign breast density category C  Return to clinic 2 months after radiation therapy is complete with a CT scan    No orders of the defined types were placed in this encounter.  The patient has a good understanding of the overall plan. she agrees with it. she will  call with any problems that may develop before the next visit here.   Harriette Ohara, MD 09/21/18

## 2018-09-28 ENCOUNTER — Other Ambulatory Visit: Payer: Self-pay

## 2018-09-28 ENCOUNTER — Encounter: Payer: Self-pay | Admitting: Radiation Oncology

## 2018-09-28 ENCOUNTER — Ambulatory Visit
Admission: RE | Admit: 2018-09-28 | Discharge: 2018-09-28 | Disposition: A | Discharge status: Home / Self Care | Payer: Medicare Other | Source: Ambulatory Visit | Attending: Radiation Oncology | Admitting: Radiation Oncology

## 2018-09-28 VITALS — BP 103/79 | HR 104 | Temp 98.2°F | Resp 20 | Ht 60.0 in | Wt 97.2 lb

## 2018-09-28 DIAGNOSIS — Z853 Personal history of malignant neoplasm of breast: Secondary | ICD-10-CM | POA: Diagnosis not present

## 2018-09-28 DIAGNOSIS — Z79810 Long term (current) use of selective estrogen receptor modulators (SERMs): Secondary | ICD-10-CM | POA: Diagnosis not present

## 2018-09-28 DIAGNOSIS — J449 Chronic obstructive pulmonary disease, unspecified: Secondary | ICD-10-CM | POA: Diagnosis not present

## 2018-09-28 DIAGNOSIS — F17210 Nicotine dependence, cigarettes, uncomplicated: Secondary | ICD-10-CM | POA: Diagnosis not present

## 2018-09-28 DIAGNOSIS — Z7982 Long term (current) use of aspirin: Secondary | ICD-10-CM | POA: Diagnosis not present

## 2018-09-28 DIAGNOSIS — C3412 Malignant neoplasm of upper lobe, left bronchus or lung: Secondary | ICD-10-CM | POA: Diagnosis not present

## 2018-09-28 DIAGNOSIS — E785 Hyperlipidemia, unspecified: Secondary | ICD-10-CM | POA: Insufficient documentation

## 2018-09-28 DIAGNOSIS — C3492 Malignant neoplasm of unspecified part of left bronchus or lung: Secondary | ICD-10-CM

## 2018-09-28 DIAGNOSIS — Z79899 Other long term (current) drug therapy: Secondary | ICD-10-CM | POA: Insufficient documentation

## 2018-09-28 DIAGNOSIS — C3411 Malignant neoplasm of upper lobe, right bronchus or lung: Secondary | ICD-10-CM | POA: Diagnosis not present

## 2018-09-28 DIAGNOSIS — Z8673 Personal history of transient ischemic attack (TIA), and cerebral infarction without residual deficits: Secondary | ICD-10-CM | POA: Insufficient documentation

## 2018-09-28 DIAGNOSIS — Z510 Encounter for antineoplastic radiation therapy: Secondary | ICD-10-CM | POA: Insufficient documentation

## 2018-09-28 DIAGNOSIS — Z923 Personal history of irradiation: Secondary | ICD-10-CM | POA: Diagnosis not present

## 2018-09-28 DIAGNOSIS — C3491 Malignant neoplasm of unspecified part of right bronchus or lung: Secondary | ICD-10-CM

## 2018-09-28 NOTE — Progress Notes (Signed)
Radiation Oncology         [phone] ________________________________  Name: Kendra Watts MRN: [id number]  Date: 09/28/2018  DOB: October 09, 1945  Re-evaluation Visit Note  CC: Merrilee Seashore, MD  Nicholas Lose, MD    ICD-10-CM   1. 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  she is doing well overall. She was seen in the clinic in 2017 for radiation to her left breast for treatment of invasive ductal carcinoma. She received 52.7 Gy in 21 fractions between 10/30/15 and 11/28/15. She is still on Tamoxifen from this cancer. She is here today for evaluation of newly diagnosed adenocarcinoma in her right upper lobe as well as squamous cell carcinoma in her left upper lobe. She experienced acute respiratory failure with hypoxia in December following a stint of severe SOB, sending her to the ER. She was admitted from 07/06/18 to 07/13/18 for treatment. She has been using supplemental oxygen (4L at night) since this visit.    She had a chest X-ray while in the ER 12/16 which revealed findings consistent with CHF with bilateral pleural effusions and mild pulmonary edema.     She underwent an angio CT scan w/wo contrast while inpatient 12/16 which revealed no pulmonary embolism. Instead, an irregular 2.7 cm nodular focus of consolidation in the posterior left upper lobe with surrounding patchy ground-glass opacity was found. This finding was indeterminate for infection versus primary bronchogenic malignancy. Spiculated 1.8 cm solid apical right upper lobe pulmonary nodule, morphologically suspicious for primary bronchogenic carcinoma, although infection was also in the differential for this finding at  that time. Small dependent bilateral pleural effusions, left greater than right, with associated mild-to-moderate compressive atelectasis of the dependent lower lobes was noted as well. Mild emphysema with mild diffuse bronchial wall thickening, suggesting COPD was seen. Dilated main pulmonary artery, suggesting pulmonary arterial hypertension and aberrant mildly ectatic right subclavian artery were also noted in the report.  She had IR thoracentesis aspiration of the pleural space on 12/18, of which the fluid contained reactive mesothelial cells.  She had an X-ray on 12/23 that showed cardiomegaly with vascular congestion and improving interstitial edema. Small to moderate bilateral effusions and bibasilar atelectasis, both improving since prior study (12/20).  PET scan on 07/30/18 revealed hypermetabolic bilateral upper lobe lung nodules, highly suspicious for synchronous primary bronchogenic carcinomas. No hypermetabolic thoracic adenopathy. Hypermetabolic left parotid nodule which could represent a synchronous benign or malignant neoplasm. Resolved bilateral pleural effusions. Aortic atherosclerosis (ICD10-I70.0), coronary artery atherosclerosis and emphysema (ICD10-J43.9). Left renal atrophy.  Lung biopsy on 09/02/18 revealed squamous cell carcinoma in her left upper lobe and adenocarcinoma in her right upper lobe as well.  [facility] Edelman MRI w/wo contrast on 09/07/18 did not show any evidence of metastatic disease.   She also recently had her annual mammogram which did not show any evidence of malignancy in either breast.   On review of systems, she reports using supplemental oxygen while sleeping. She continues to smoke up to a pack a day. she denies any other symptoms. Pertinent positives are listed and detailed within the above HPI.                 ALLERGIES:  has No Known Allergies.  Meds: Current Outpatient Medications  Medication Sig Dispense Refill  . aspirin 81 MG tablet Take 81 mg by mouth  daily.    . Calcium-Magnesium-Vitamin D (CALCIUM 1200+D3 PO) Take 1 tablet by mouth daily.    Marland Kitchen doxazosin (CARDURA) 8 MG tablet Take 8 mg by mouth daily.     . furosemide (LASIX) 20 MG tablet Take 1 tablet (20 mg total) by mouth daily. 30 tablet 0  . levothyroxine (SYNTHROID, LEVOTHROID) 88 MCG tablet Take 88 mcg by mouth daily before breakfast.    . Multiple Vitamins-Minerals (MULTIVITAMIN WITH MINERALS) tablet Take 1 tablet by mouth daily.     . Naphazoline HCl (CLEAR EYES OP) Place 2 drops into both eyes daily.    . pantoprazole (PROTONIX) 40 MG tablet Take 1 tablet (40 mg total) by mouth daily. 30 tablet 0  . rosuvastatin (CRESTOR) 10 MG tablet Take 10 mg by mouth daily.     . tamoxifen (NOLVADEX) 20 MG tablet TAKE 1 TABLET BY MOUTH  DAILY (Patient taking differently: Take 20 mg by mouth daily. ) 90 tablet 3  . umeclidinium-vilanterol (ANORO ELLIPTA) 62.5-25 MCG/INH AEPB Inhale 1 puff into the lungs daily.     No current facility-administered medications for this encounter.     Physical Findings: The patient is in no acute distress. Patient is alert and oriented.  height is 5' (1.524 m) and weight is 97 lb 3.2 oz (44.1 kg). Her oral temperature is 98.2 F (36.8 C). Her blood pressure is 103/79 and her pulse is 104 (abnormal). Her respiration is 20 and oxygen saturation is 97%. .  No significant changes. Lungs are clear to auscultation bilaterally. Heart has regular rate and rhythm. No palpable cervical, supraclavicular, or axillary adenopathy. Abdomen soft, non-tender, normal bowel sounds. Right breast with no palpable mass, nipple discharge, or bleeding. Left breast with some continued edema from previous radiation therapy. No dominant mass, nipple discharge or bleeding. Mild hyperpigmentation changes noted.    Lab Findings: Lab Results  Component Value Date   WBC 7.4 08/26/2018   HGB 12.6 08/26/2018   HCT 38.8 08/26/2018   MCV 88.2 08/26/2018   PLT 222 08/26/2018    Radiographic  Findings: Mr Jeri Cos HY Contrast  Result Date: 09/08/2018 CLINICAL DATA:  Non-small cell lung cancer.  Staging. EXAM: MRI HEAD WITHOUT AND WITH CONTRAST TECHNIQUE: Multiplanar, multiecho pulse sequences of the brain and surrounding structures were obtained without and with intravenous contrast. CONTRAST:  4 cc Gadavist COMPARISON:  Head CT 05/27/2005 FINDINGS: Brain: Diffusion imaging does not show any acute or subacute infarction. There chronic small-vessel ischemic changes of the pons. 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 Cerebral hemispheres show moderate changes of chronic small vessel disease affecting the deep and subcortical white matter. Few minimal small vessel insults within the thalami and basal ganglia. No cortical or large vessel territory infarction. No evidence of primary or metastatic mass lesion, hemorrhage, hydrocephalus or extra-axial collection. Vascular: Major vessels at the base of the brain show flow. Skull and upper cervical spine: Negative Sinuses/Orbits: Clear/normal Other: None IMPRESSION: No evidence of metastatic disease. Moderate chronic small-vessel ischemic changes as described above. Electronically Signed   By: [name] M.D.   On: 09/08/2018 09:58   Dg Chest Port 1 View  Result Date: 09/02/2018 CLINICAL DATA:  73 year old female status post bronchoscopic biopsy with placement of markers. EXAM: PORTABLE CHEST 1 VIEW COMPARISON:  PET-CT 07/30/2018 FINDINGS: Portable AP semi upright view  at 1238 hours. Large lung volumes. Chronic left chest wall surgical clips. New bilateral upper lobe region surgical clips likely placed bronchoscopic Lee. There is Fe opacity in the left upper lobe superior to the clips. No pneumothorax or pleural effusion. Mediastinal contours are stable. Calcified aortic atherosclerosis. Visualized tracheal air column is within normal limits. IMPRESSION: 1. Bilateral upper lobe region marker clips after bronchoscopic biopsy. 2. No  pneumothorax, but vague left upper lobe opacity superior to the clips might indicate a pulmonary hematoma. 3. No other acute cardiopulmonary abnormality. Electronically Signed   By: Genevie Ann M.D.   On: 09/02/2018 13:13   Mm Diag Breast Tomo Bilateral  Result Date: 09/09/2018 CLINICAL DATA:  Annual exam. History of left breast cancer diagnosed in 2017. Presents for routine exam. EXAM: DIGITAL DIAGNOSTIC BILATERAL MAMMOGRAM WITH CAD AND TOMO COMPARISON:  Previous exam(s). ACR Breast Density Category c: The breast tissue is heterogeneously dense, which may obscure small masses. FINDINGS: Stable lumpectomy changes in the posterior third of the upper-outer left breast. No mass, nonsurgical distortion, or suspicious microcalcification is identified in either breast to suggest malignancy. Mammographic images were processed with CAD. IMPRESSION: No evidence of malignancy in either breast. Lumpectomy changes on the left. RECOMMENDATION: Diagnostic mammogram is suggested in 1 year. (Code:[id number]) I have discussed the findings and recommendations with the patient. Results were also provided in writing at the conclusion of the visit. If applicable, a reminder letter will be sent to the patient regarding the next appointment. BI-RADS CATEGORY  2: Benign. Electronically Signed   By: Curlene Dolphin M.D.   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 Vascular Interventions: PTA/stent placement of ostial right renal artery                         stenosis in 2/16 by Dr. Gwenlyn Found. Comparison Study: Previous renal duplex performed in 12/18 showed a patent right                   renal artery with PSV of 211 cm/sec, consistent with <60%                   stenosis. The left kidney was small in size and  demonstrated                   abnormal flow. Performing Technologist: Mariane Masters RVT  Examination Guidelines: A complete evaluation includes B-mode imaging, spectral Doppler, color Doppler, and power Doppler as needed of all accessible portions of each vessel. Bilateral testing is considered an integral part of a complete examination. Limited examinations for reoccurring indications may be performed as noted.  Duplex Findings: +--------------------+--------+--------+-----------+--------+ Mesenteric          PSV cm/sEDV cm/s  Plaque   Comments +--------------------+--------+--------+-----------+--------+ Aorta Prox             71      1                        +--------------------+--------+--------+-----------+--------+ Aorta Distal           63      1                        +--------------------+--------+--------+-----------+--------+  Celiac Artery Origin  304      57     diffuse           +--------------------+--------+--------+-----------+--------+ SMA Proximal          494      96   hyperechoic         +--------------------+--------+--------+-----------+--------+  +------------------+--------+--------+-------+ Right Renal ArteryPSV cm/sEDV cm/sComment +------------------+--------+--------+-------+ Origin              211      48           +------------------+--------+--------+-------+ Proximal            141      30           +------------------+--------+--------+-------+ Mid                 138      33           +------------------+--------+--------+-------+ Distal              132      28           +------------------+--------+--------+-------+ Technologist observations Renal Artery(s):Unable to visualize the left kidney on today's exam due to severe atrophy. +------------+--------+--------+----+-----------+--------+--------+---+ Right KidneyPSV cm/sEDV cm/sRI  Left KidneyPSV cm/sEDV cm/sRI   +------------+--------+--------+----+-----------+--------+--------+---+ Upper Pole  29      9       0.71Upper Pole                     +------------+--------+--------+----+-----------+--------+--------+---+ Mid         36      11      0.70Mid                            +------------+--------+--------+----+-----------+--------+--------+---+ Lower Pole  24      7       0.72Lower Pole                     +------------+--------+--------+----+-----------+--------+--------+---+ Hilar                           Hilar                          +------------+--------+--------+----+-----------+--------+--------+---+ +------------------+--------+------------------++ Right Kidney              Left Kidney        +------------------+--------+------------------++ RAR                       RAR                +------------------+--------+------------------++ RAR (manual)              RAR (manual)       +------------------+--------+------------------++ Cortex                    Cortex             +------------------+--------+------------------++ Cortex thickness  12.00 mmCorex thickness    +------------------+--------+------------------++ Kidney length (cm)10.80   Kidney length (cm) +------------------+--------+------------------++  Summary: Renal:  Right: 1-59% stenosis of the right renal artery. RRV flow present.        Normal size right kidney. Normal right Resisitive Index.        Normal cortical thickness of right kidney. Left:  Kidney not able to be visualized due  to severe atrophy. 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 Areas of limited visceral study include left kidney size, left renal artery and left parenchymal flow.  *See table(s) above for measurements and observations.  Suggest follow up study in 12 months.  Diagnosing physician: Ena Dawley MD  Electronically signed by Ena Dawley MD on 09/17/2018 at  12:58:44 PM.    Final    Dg C-arm Bronchoscopy  Result Date: 09/02/2018 C-ARM BRONCHOSCOPY: Fluoroscopy was utilized by the requesting physician.  No radiographic interpretation.    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 Her case was presented at the multidisciplinary thoracic oncology conference and it was this conference recommendation for SBRT to both lung lesions. I discussed with pt that with her previous left breast radiation with some coverage of her left lung as well as her COPD with continued smoking and the necessity of treating both lungs with SBRT, she may potentially have some degradation in her overall respiratory function. The patient currently uses oxygen only at night for sleeping (4L).  She understands this issue and is willing to accept this risk especially given the potential for cure of both lesions with stereotactic body radiation therapy..  Today, I talked to the patient  about the findings and work-up thus far.  We discussed the natural history of her cancers and general treatment, highlighting the role of SBRT in the management.  We discussed the available radiation techniques, and focused on the details of logistics and delivery.  We reviewed the anticipated acute and late sequelae associated with radiation in this setting.  The patient was encouraged to ask questions that I answered to the best of my ability.  A patient consent form was discussed and signed.  We retained a copy for our records.  The patient would like to proceed with radiation and will be scheduled for CT simulation.   Plan: She will return for SBRT  simulation next Wednesday, March 18 at [facility].  ____________________________________   Blair Promise, PhD, MD    This document serves as a record of services personally performed by [name], MD. It was created on his behalf by Mary-Margaret Loma Messing, a trained medical scribe. The creation of this record is based on the scribe's personal observations and the provider's statements to them. This document has been checked and approved by the attending provider.

## 2018-10-01 DIAGNOSIS — L080 Pyoderma: Secondary | ICD-10-CM | POA: Diagnosis not present

## 2018-10-01 DIAGNOSIS — D485 Neoplasm of uncertain behavior of skin: Secondary | ICD-10-CM | POA: Diagnosis not present

## 2018-10-01 DIAGNOSIS — Z85828 Personal history of other malignant neoplasm of skin: Secondary | ICD-10-CM | POA: Diagnosis not present

## 2018-10-01 DIAGNOSIS — L0889 Other specified local infections of the skin and subcutaneous tissue: Secondary | ICD-10-CM | POA: Diagnosis not present

## 2018-10-07 ENCOUNTER — Ambulatory Visit
Admission: RE | Admit: 2018-10-07 | Discharge: 2018-10-07 | Disposition: A | Discharge status: Home / Self Care | Payer: Medicare Other | Source: Ambulatory Visit | Attending: Radiation Oncology | Admitting: Radiation Oncology

## 2018-10-07 ENCOUNTER — Other Ambulatory Visit: Payer: Self-pay

## 2018-10-07 DIAGNOSIS — F17210 Nicotine dependence, cigarettes, uncomplicated: Secondary | ICD-10-CM | POA: Diagnosis not present

## 2018-10-07 DIAGNOSIS — C3411 Malignant neoplasm of upper lobe, right bronchus or lung: Secondary | ICD-10-CM | POA: Diagnosis not present

## 2018-10-07 DIAGNOSIS — C3491 Malignant neoplasm of unspecified part of right bronchus or lung: Secondary | ICD-10-CM

## 2018-10-07 DIAGNOSIS — Z7982 Long term (current) use of aspirin: Secondary | ICD-10-CM | POA: Diagnosis not present

## 2018-10-07 DIAGNOSIS — Z510 Encounter for antineoplastic radiation therapy: Secondary | ICD-10-CM | POA: Diagnosis not present

## 2018-10-07 DIAGNOSIS — Z79899 Other long term (current) drug therapy: Secondary | ICD-10-CM | POA: Diagnosis not present

## 2018-10-07 DIAGNOSIS — C3492 Malignant neoplasm of unspecified part of left bronchus or lung: Secondary | ICD-10-CM

## 2018-10-07 DIAGNOSIS — C3412 Malignant neoplasm of upper lobe, left bronchus or lung: Secondary | ICD-10-CM | POA: Diagnosis not present

## 2018-10-07 NOTE — Progress Notes (Signed)
  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  Identity was confirmed.  All relevant records and images related to the planned course of therapy were reviewed.  The patient freely provided informed written consent to proceed with treatment after reviewing the details related to the planned course of therapy. The consent form was witnessed and verified by the simulation staff.  Then, the patient was set-up in a stable reproducible  supine position for radiation therapy.  A BodyFix immobilization pillow was fabricated for reproducible positioning.  Then I personally applied the abdominal compression paddle to limit respiratory excursion.  4D respiratoy motion management CT images were obtained.  Surface markings were placed.  The CT images were loaded into the planning software.  Then, using Cine, MIP, and standard views, the internal target volume (ITV) and planning target volumes (PTV) were delinieated, and avoidance structures were contoured.  Treatment planning then occurred.  The radiation prescription was entered and confirmed.  A total of two complex treatment devices were fabricated in the form of the BodyFix immobilization pillow and a neck accuform cushion.  I have requested : 3D Simulation  I have requested a DVH of the following structures: Heart, Lungs, Esophagus, Chest Wall, Brachial Plexus, Major Blood Vessels, and targets.  PLAN:  The patient will receive 54 Gy in 3 fractions Directed at the right upper lung lesion. The left upper lung lesion will  also receive 54 gray in 3 fractions.  -----------------------------------  Blair Promise, PhD, MD This document serves as a record of services personally performed by [name], MD. It was created on his behalf by Mary-Margaret Loma Messing, a trained medical scribe. The creation of this record is based on the scribe's personal observations and the provider's statements to them. This document has been checked and approved by the attending provider.

## 2018-10-13 ENCOUNTER — Other Ambulatory Visit: Payer: Self-pay | Admitting: Hematology and Oncology

## 2018-10-14 NOTE — Telephone Encounter (Signed)
Tamoxifen started 12-05-2015.

## 2018-10-15 DIAGNOSIS — R300 Dysuria: Secondary | ICD-10-CM | POA: Diagnosis not present

## 2018-10-15 DIAGNOSIS — N390 Urinary tract infection, site not specified: Secondary | ICD-10-CM | POA: Diagnosis not present

## 2018-10-20 DIAGNOSIS — F17210 Nicotine dependence, cigarettes, uncomplicated: Secondary | ICD-10-CM | POA: Diagnosis not present

## 2018-10-20 DIAGNOSIS — Z510 Encounter for antineoplastic radiation therapy: Secondary | ICD-10-CM | POA: Diagnosis not present

## 2018-10-20 DIAGNOSIS — C3412 Malignant neoplasm of upper lobe, left bronchus or lung: Secondary | ICD-10-CM | POA: Diagnosis not present

## 2018-10-20 DIAGNOSIS — Z79899 Other long term (current) drug therapy: Secondary | ICD-10-CM | POA: Diagnosis not present

## 2018-10-20 DIAGNOSIS — C3411 Malignant neoplasm of upper lobe, right bronchus or lung: Secondary | ICD-10-CM | POA: Diagnosis not present

## 2018-10-20 DIAGNOSIS — Z7982 Long term (current) use of aspirin: Secondary | ICD-10-CM | POA: Diagnosis not present

## 2018-10-27 ENCOUNTER — Ambulatory Visit
Admission: RE | Admit: 2018-10-27 | Discharge: 2018-10-27 | Disposition: A | Discharge status: Home / Self Care | Payer: Medicare Other | Source: Ambulatory Visit | Attending: Radiation Oncology | Admitting: Radiation Oncology

## 2018-10-27 ENCOUNTER — Other Ambulatory Visit: Payer: Self-pay

## 2018-10-27 DIAGNOSIS — C3491 Malignant neoplasm of unspecified part of right bronchus or lung: Secondary | ICD-10-CM

## 2018-10-27 DIAGNOSIS — C3411 Malignant neoplasm of upper lobe, right bronchus or lung: Secondary | ICD-10-CM | POA: Insufficient documentation

## 2018-10-27 DIAGNOSIS — C3412 Malignant neoplasm of upper lobe, left bronchus or lung: Secondary | ICD-10-CM | POA: Insufficient documentation

## 2018-10-27 DIAGNOSIS — E785 Hyperlipidemia, unspecified: Secondary | ICD-10-CM | POA: Insufficient documentation

## 2018-10-27 DIAGNOSIS — Z79899 Other long term (current) drug therapy: Secondary | ICD-10-CM | POA: Diagnosis not present

## 2018-10-27 DIAGNOSIS — F17210 Nicotine dependence, cigarettes, uncomplicated: Secondary | ICD-10-CM | POA: Insufficient documentation

## 2018-10-27 DIAGNOSIS — Z853 Personal history of malignant neoplasm of breast: Secondary | ICD-10-CM | POA: Insufficient documentation

## 2018-10-27 DIAGNOSIS — J449 Chronic obstructive pulmonary disease, unspecified: Secondary | ICD-10-CM | POA: Insufficient documentation

## 2018-10-27 DIAGNOSIS — C3492 Malignant neoplasm of unspecified part of left bronchus or lung: Secondary | ICD-10-CM

## 2018-10-27 DIAGNOSIS — Z79810 Long term (current) use of selective estrogen receptor modulators (SERMs): Secondary | ICD-10-CM | POA: Diagnosis not present

## 2018-10-27 DIAGNOSIS — Z7982 Long term (current) use of aspirin: Secondary | ICD-10-CM | POA: Diagnosis not present

## 2018-10-27 DIAGNOSIS — Z510 Encounter for antineoplastic radiation therapy: Secondary | ICD-10-CM | POA: Diagnosis not present

## 2018-10-27 DIAGNOSIS — Z8673 Personal history of transient ischemic attack (TIA), and cerebral infarction without residual deficits: Secondary | ICD-10-CM | POA: Insufficient documentation

## 2018-10-27 NOTE — Progress Notes (Signed)
  Radiation Oncology         [phone] ________________________________  Name: Kendra Watts MRN: [id number]  Date: 10/27/2018  DOB: 10-05-1945  Stereotactic Body Radiotherapy Treatment Procedure Note  NARRATIVE:  Kendra Watts was brought to the stereotactic radiation treatment machine and placed supine on the CT couch. The patient was set up for stereotactic body radiotherapy on the body fix pillow.  3D TREATMENT PLANNING AND DOSIMETRY:  The patient's radiation plan was reviewed and approved prior to starting treatment.  It showed 3-dimensional radiation distributions overlaid onto the planning CT.  The [facility] for the target structures as well as the organs at risk were reviewed. The documentation of this is filed in the radiation oncology EMR.  SIMULATION VERIFICATION:  The patient underwent CT imaging on the treatment unit.  These were carefully aligned to document that the ablative radiation dose would cover the target volume and maximally spare the nearby organs at risk according to the planned distribution.  SPECIAL TREATMENT PROCEDURE: Kendra Watts [facility] received high dose ablative stereotactic body radiotherapy to the planned target volume without unforeseen complications. Treatment was delivered uneventfully. The high doses associated with stereotactic body radiotherapy and the significant potential risks require careful treatment set up and patient monitoring constituting a special treatment procedure   STEREOTACTIC TREATMENT MANAGEMENT:  Following delivery, the patient was evaluated clinically. The patient tolerated treatment without significant acute effects, and was discharged to home in stable condition.    PLAN: Continue treatment as planned.  ________________________________  Blair Promise, PhD, MD

## 2018-10-29 ENCOUNTER — Ambulatory Visit
Admission: RE | Admit: 2018-10-29 | Discharge: 2018-10-29 | Disposition: A | Discharge status: Home / Self Care | Payer: Medicare Other | Source: Ambulatory Visit | Attending: Radiation Oncology | Admitting: Radiation Oncology

## 2018-10-29 ENCOUNTER — Other Ambulatory Visit: Payer: Self-pay

## 2018-10-29 DIAGNOSIS — Z7982 Long term (current) use of aspirin: Secondary | ICD-10-CM | POA: Diagnosis not present

## 2018-10-29 DIAGNOSIS — F17210 Nicotine dependence, cigarettes, uncomplicated: Secondary | ICD-10-CM | POA: Diagnosis not present

## 2018-10-29 DIAGNOSIS — C3491 Malignant neoplasm of unspecified part of right bronchus or lung: Secondary | ICD-10-CM

## 2018-10-29 DIAGNOSIS — C3412 Malignant neoplasm of upper lobe, left bronchus or lung: Secondary | ICD-10-CM | POA: Diagnosis not present

## 2018-10-29 DIAGNOSIS — Z510 Encounter for antineoplastic radiation therapy: Secondary | ICD-10-CM | POA: Diagnosis not present

## 2018-10-29 DIAGNOSIS — C3492 Malignant neoplasm of unspecified part of left bronchus or lung: Secondary | ICD-10-CM

## 2018-10-29 DIAGNOSIS — Z79899 Other long term (current) drug therapy: Secondary | ICD-10-CM | POA: Diagnosis not present

## 2018-10-29 DIAGNOSIS — C3411 Malignant neoplasm of upper lobe, right bronchus or lung: Secondary | ICD-10-CM | POA: Diagnosis not present

## 2018-10-29 NOTE — Progress Notes (Signed)
  Radiation Oncology         [phone] ________________________________  Name: Kendra Watts MRN: [id number]  Date: 10/29/2018  DOB: January 09, 1946  Stereotactic Body Radiotherapy Treatment Procedure Note  NARRATIVE:  Kendra Watts was brought to the stereotactic radiation treatment machine and placed supine on the CT couch. The patient was set up for stereotactic body radiotherapy on the body fix pillow.  3D TREATMENT PLANNING AND DOSIMETRY:  The patient's radiation plan was reviewed and approved prior to starting treatment.  It showed 3-dimensional radiation distributions overlaid onto the planning CT.  The [facility] for the target structures as well as the organs at risk were reviewed. The documentation of this is filed in the radiation oncology EMR.  SIMULATION VERIFICATION:  The patient underwent CT imaging on the treatment unit.  These were carefully aligned to document that the ablative radiation dose would cover the target volume and maximally spare the nearby organs at risk according to the planned distribution.  SPECIAL TREATMENT PROCEDURE: Kendra Watts received high dose ablative stereotactic body radiotherapy to the planned target volume without unforeseen complications. Treatment was delivered uneventfully. The high doses associated with stereotactic body radiotherapy and the significant potential risks require careful treatment set up and patient monitoring constituting a special treatment procedure   STEREOTACTIC TREATMENT MANAGEMENT:  Following delivery, the patient was evaluated clinically. The patient tolerated treatment without significant acute effects, and was discharged to home in stable condition.    PLAN: Continue treatment as planned.  ________________________________  Blair Promise, PhD, MD  This document serves as a record of services personally performed by [name], MD. It was created on his behalf by Rae Lips, a trained medical scribe. The creation of  this record is based on the scribe's personal observations and the provider's statements to them. This document has been checked and approved by the attending provider.

## 2018-11-02 ENCOUNTER — Other Ambulatory Visit: Payer: Self-pay

## 2018-11-02 ENCOUNTER — Ambulatory Visit
Admission: RE | Admit: 2018-11-02 | Discharge: 2018-11-02 | Disposition: A | Discharge status: Home / Self Care | Payer: Medicare Other | Source: Ambulatory Visit | Attending: Radiation Oncology | Admitting: Radiation Oncology

## 2018-11-02 ENCOUNTER — Encounter: Payer: Self-pay | Admitting: Radiation Oncology

## 2018-11-02 DIAGNOSIS — Z510 Encounter for antineoplastic radiation therapy: Secondary | ICD-10-CM | POA: Diagnosis not present

## 2018-11-02 DIAGNOSIS — C3411 Malignant neoplasm of upper lobe, right bronchus or lung: Secondary | ICD-10-CM | POA: Diagnosis not present

## 2018-11-02 DIAGNOSIS — C3412 Malignant neoplasm of upper lobe, left bronchus or lung: Secondary | ICD-10-CM | POA: Diagnosis not present

## 2018-11-02 DIAGNOSIS — C3492 Malignant neoplasm of unspecified part of left bronchus or lung: Secondary | ICD-10-CM

## 2018-11-02 DIAGNOSIS — Z79899 Other long term (current) drug therapy: Secondary | ICD-10-CM | POA: Diagnosis not present

## 2018-11-02 DIAGNOSIS — F17210 Nicotine dependence, cigarettes, uncomplicated: Secondary | ICD-10-CM | POA: Diagnosis not present

## 2018-11-02 DIAGNOSIS — C50412 Malignant neoplasm of upper-outer quadrant of left female breast: Secondary | ICD-10-CM | POA: Diagnosis not present

## 2018-11-02 DIAGNOSIS — Z7982 Long term (current) use of aspirin: Secondary | ICD-10-CM | POA: Diagnosis not present

## 2018-11-02 DIAGNOSIS — C3491 Malignant neoplasm of unspecified part of right bronchus or lung: Secondary | ICD-10-CM

## 2018-11-02 NOTE — Progress Notes (Signed)
  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  Narrative: The patient tolerated radiation treatment relatively well. She reported fatigue. She denied any new cough or difficulty swallowing.  Plan: The patient has completed radiation treatment. The patient will return to radiation oncology clinic for routine followup in one month. I advised them to call or return sooner if they have any questions or concerns related to their recovery or treatment.  -----------------------------------  Blair Promise, PhD, MD   This document serves as a record of services personally performed by [name], MD. It was created on his behalf by Wilburn Mylar, a trained medical scribe. The creation of this record is based on the scribe's personal observations and the provider's statements to them. This document has been checked and approved by the attending provider.

## 2018-11-04 ENCOUNTER — Ambulatory Visit: Payer: Medicare Other

## 2018-11-06 ENCOUNTER — Ambulatory Visit: Payer: Medicare Other

## 2018-11-11 DIAGNOSIS — I739 Peripheral vascular disease, unspecified: Secondary | ICD-10-CM | POA: Diagnosis not present

## 2018-11-11 DIAGNOSIS — E782 Mixed hyperlipidemia: Secondary | ICD-10-CM | POA: Diagnosis not present

## 2018-11-11 DIAGNOSIS — I129 Hypertensive chronic kidney disease with stage 1 through stage 4 chronic kidney disease, or unspecified chronic kidney disease: Secondary | ICD-10-CM | POA: Diagnosis not present

## 2018-11-11 DIAGNOSIS — I700 Atherosclerosis of aorta: Secondary | ICD-10-CM | POA: Diagnosis not present

## 2018-11-17 NOTE — Progress Notes (Signed)
  Radiation Oncology         [phone] ________________________________  Name: Kendra Watts MRN: [id number]  Date: 11/02/2018  DOB: May 03, 1946  Stereotactic Body Radiotherapy Treatment Procedure Note  NARRATIVE:  Kendra Watts was brought to the stereotactic radiation treatment machine and placed supine on the CT couch. The patient was set up for stereotactic body radiotherapy on the body fix pillow.  3D TREATMENT PLANNING AND DOSIMETRY:  The patient's radiation plan was reviewed and approved prior to starting treatment.  It showed 3-dimensional radiation distributions overlaid onto the planning CT.  [facility] for the target structures as well as the organs at risk were reviewed. The documentation of this is filed in the radiation oncology EMR.  SIMULATION VERIFICATION:  The patient underwent CT imaging on the treatment unit.  These were carefully aligned to document that the ablative radiation dose would cover the target volume and maximally spare the nearby organs at risk according to the planned distribution.  SPECIAL TREATMENT PROCEDURE: Kendra Watts [facility] received high dose ablative stereotactic body radiotherapy to the planned target volume without unforeseen complications. Treatment was delivered uneventfully. The high doses associated with stereotactic body radiotherapy and the significant potential risks require careful treatment set up and patient monitoring constituting a special treatment procedure   STEREOTACTIC TREATMENT MANAGEMENT:  Following delivery, the patient was evaluated clinically. The patient tolerated treatment without significant acute effects, and was discharged to home in stable condition.    PLAN: Continue treatment as planned.  ________________________________  Blair Promise, PhD, MD

## 2018-11-18 ENCOUNTER — Telehealth: Payer: Self-pay

## 2018-11-18 NOTE — Telephone Encounter (Signed)
Pt calling to inquire if sciatic pain would be from radiation. Conveyed to pt that SBRT to BUL nodules did not cause sciatic pain and encouraged pt to see her PCP. Pt verbalized understanding and agreement. Loma Sousa, RN BSN

## 2018-11-20 DIAGNOSIS — M4180 Other forms of scoliosis, site unspecified: Secondary | ICD-10-CM | POA: Diagnosis not present

## 2018-11-20 DIAGNOSIS — M545 Low back pain: Secondary | ICD-10-CM | POA: Diagnosis not present

## 2018-11-24 ENCOUNTER — Telehealth: Payer: Self-pay | Admitting: *Deleted

## 2018-11-24 NOTE — Telephone Encounter (Signed)
CALLED PATIENT TO ASK ABOUT MOVING FU VISIT TO 03-11-19 @ 10:30 AM, NO ANSWER WILL CALL LATER

## 2018-11-24 NOTE — Telephone Encounter (Signed)
CALLED PATIENT TO ASK ABOUT RESCHEDULING FU FROM 12-10-18 TO AUGUST, PATIENT AGREED TO 03-11-19 @ 11:45 AM

## 2018-11-25 DIAGNOSIS — I129 Hypertensive chronic kidney disease with stage 1 through stage 4 chronic kidney disease, or unspecified chronic kidney disease: Secondary | ICD-10-CM | POA: Diagnosis not present

## 2018-11-25 DIAGNOSIS — J449 Chronic obstructive pulmonary disease, unspecified: Secondary | ICD-10-CM | POA: Diagnosis not present

## 2018-11-25 DIAGNOSIS — E782 Mixed hyperlipidemia: Secondary | ICD-10-CM | POA: Diagnosis not present

## 2018-11-25 DIAGNOSIS — C3410 Malignant neoplasm of upper lobe, unspecified bronchus or lung: Secondary | ICD-10-CM | POA: Diagnosis not present

## 2018-11-25 DIAGNOSIS — I739 Peripheral vascular disease, unspecified: Secondary | ICD-10-CM | POA: Diagnosis not present

## 2018-11-25 DIAGNOSIS — Z7189 Other specified counseling: Secondary | ICD-10-CM | POA: Diagnosis not present

## 2018-11-30 DIAGNOSIS — M4180 Other forms of scoliosis, site unspecified: Secondary | ICD-10-CM | POA: Diagnosis not present

## 2018-12-10 ENCOUNTER — Ambulatory Visit: Payer: Self-pay | Admitting: Radiation Oncology

## 2018-12-10 NOTE — Assessment & Plan Note (Deleted)
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 Stereotactic radio surgery completed 11/04/2018  Plan for CT chest and follow-up

## 2018-12-15 ENCOUNTER — Telehealth: Payer: Self-pay | Admitting: Hematology and Oncology

## 2018-12-15 NOTE — Telephone Encounter (Signed)
Rescheduled 5/28 appt per sch msg. Called both numbers for patient. No answer and no voicemail. Will mail printout.

## 2018-12-16 ENCOUNTER — Ambulatory Visit: Payer: Medicare Other | Admitting: Emergency Medicine

## 2018-12-16 DIAGNOSIS — H524 Presbyopia: Secondary | ICD-10-CM | POA: Diagnosis not present

## 2018-12-16 DIAGNOSIS — H25813 Combined forms of age-related cataract, bilateral: Secondary | ICD-10-CM | POA: Diagnosis not present

## 2018-12-16 DIAGNOSIS — H52203 Unspecified astigmatism, bilateral: Secondary | ICD-10-CM | POA: Diagnosis not present

## 2018-12-17 ENCOUNTER — Inpatient Hospital Stay: Payer: Medicare Other | Admitting: Hematology and Oncology

## 2018-12-21 DIAGNOSIS — H903 Sensorineural hearing loss, bilateral: Secondary | ICD-10-CM | POA: Diagnosis not present

## 2018-12-25 DIAGNOSIS — L0390 Cellulitis, unspecified: Secondary | ICD-10-CM | POA: Diagnosis not present

## 2018-12-29 DIAGNOSIS — M4180 Other forms of scoliosis, site unspecified: Secondary | ICD-10-CM | POA: Diagnosis not present

## 2019-01-08 ENCOUNTER — Other Ambulatory Visit (HOSPITAL_COMMUNITY): Payer: Self-pay | Admitting: Cardiovascular Disease

## 2019-01-08 DIAGNOSIS — Z9889 Other specified postprocedural states: Secondary | ICD-10-CM

## 2019-01-08 DIAGNOSIS — I701 Atherosclerosis of renal artery: Secondary | ICD-10-CM

## 2019-01-12 NOTE — Assessment & Plan Note (Deleted)
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 Treatment: SBRT bilaterally by Dr. Sondra Come  We will perform PET CT scan and follow-up after that

## 2019-01-18 ENCOUNTER — Inpatient Hospital Stay: Payer: Medicare Other | Admitting: Hematology and Oncology

## 2019-01-19 NOTE — Assessment & Plan Note (Signed)
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

## 2019-01-19 NOTE — Assessment & Plan Note (Addendum)
09/22/2015:Left lumpectomy Kendra Watts): IDC grade 1, 1.1 minutes, ADH, LCIS, margins negative, 0/3 lymph nodes, ER 100%, PR 90%, HER-2 negative, Ki-67 10%, T1 cN0 stage IA, Oncotype DX score 14, 9% ROR Adjuvant radiation therapy 10/30/2015 to 11/28/2015  Current treatment: Tamoxifen 20 mg daily started 11/2015 Tamoxifen toxicities: None Breast cancer surveillance: Mammograms 09/09/2018: Benign breast density category C

## 2019-01-20 ENCOUNTER — Telehealth: Payer: Self-pay | Admitting: Hematology and Oncology

## 2019-01-20 NOTE — Telephone Encounter (Signed)
I talk with patient regarding visit

## 2019-01-21 ENCOUNTER — Telehealth: Payer: Self-pay | Admitting: Hematology and Oncology

## 2019-01-23 NOTE — Progress Notes (Signed)
HEMATOLOGY-ONCOLOGY TELEPHONE VISIT PROGRESS NOTE  I connected with Gayleen Orem on 01/25/2019 at  2:45 PM EDT by telephone and verified that I am speaking with the correct person using two identifiers.  I discussed the limitations, risks, security and privacy concerns of performing an evaluation and management service by telephone and the availability of in person appointments.  I also discussed with the patient that there may be a patient responsible charge related to this service. The patient expressed understanding and agreed to proceed.   History of Present Illness: Kendra Watts is a 73 y.o. female with above-mentioned history of breast cancer who presented with the bilateral lung cancers that are of 2 different histologies, squamous cell cancer in the right lung and adenocarcinoma the left lung. She underwent 3 radiation treatments to bilateral lungs in 10/2018. She presents over the phone today to discuss further treatment.   Oncology History  Breast cancer of upper-outer quadrant of left female breast ([place])  08/30/2015 Imaging   DEXA scan: Normal    09/07/2015 Initial Diagnosis   Screening detected left breast mass at 1:00 position 4 x 3 x 4 mm biopsy grade 1 invasive ductal carcinoma ER 100% PR 90% positive HER-2 negative ratio 1.18 Ki-67 10%, T1 aN0 stage IA clinical stage   09/22/2015 Surgery   Left lumpectomy [name]): IDC grade 1, 1.1 minutes, ADH, LCIS, margins negative, 0/3 lymph nodes, ER 100%, PR 90%, HER-2 negative, Ki-67 10%, T1 cN0 stage IA, Oncotype DX score 14, 9% ROR   10/30/2015 - 11/28/2015 Radiation Therapy   Adjuvant XRT ([facility]). Left breast: 42.72 Gy in 16 treatments.  Left breast "boost": 10 Gy in 5 treatments.  Total: 52.72 Gy in 21 treatments    11/2015 -  Anti-estrogen oral therapy   Tamoxifen 41m daily. Planned duration of treatment: 5 years ([facility])   Squamous cell lung cancer, left ([facility]  09/10/2018 Initial Diagnosis   Squamous cell lung cancer, left ([facility]    09/10/2018 Cancer Staging   Staging form: Lung, AJCC 8th Edition - Clinical stage from 09/10/2018: cT1, cN0, cM0 - Signed by GNicholas Lose MD on 09/10/2018   10/27/2018 - 11/02/2018 Radiation Therapy   Stereotactic radiosurgery   Adenocarcinoma, lung, right ([facility]  09/02/2018 Initial Diagnosis   Lung nodule evaluation of her former smoker with shortness of breath during hospitalization, PET CT, right upper lobe nodule 1.2 x 1.1 cm SUV 7.6 biopsy revealed adenocarcinoma positive for TTF-1 and Napsin A, left upper lobe nodule 2.5 x 2.1 cm SUV 9.8, no lymph node hypermetabolism, 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1 cm SUV 9.8, no lymph node hypermetabolism, 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 and TTF-1 is negative  Stereotactic radiosurgery April 2020 Surveillance plan: CT chest in aug and she has an appt with Dr.Kinard  Breast cancer of upper-outer quadrant of left  female breast ([place]) 09/22/2015:Left lumpectomy [name]): IDC grade 1, 1.1 minutes, ADH, LCIS, margins negative, 0/3 lymph nodes, ER 100%, PR 90%, HER-2 negative, Ki-67 10%, T1 cN0 stage IA, Oncotype DX score 14, 9% ROR Adjuvant radiation therapy 10/30/2015 to 11/28/2015  Current treatment: Tamoxifen 20 mg daily started 11/2015 Tamoxifen toxicities: None Breast cancer surveillance:  Mammograms 09/09/2018: Benign breast density category C  I discussed the assessment and treatment plan with the patient. The patient was provided an opportunity to ask questions and all were answered. The patient agreed with the plan and demonstrated an understanding of the instructions. The patient was advised to call back or seek an in-person evaluation if the symptoms worsen or if the condition fails to improve as anticipated.   I provided 15 minutes of non-face-to-face time during this encounter.   Rulon Eisenmenger, MD 01/25/2019    I, Molly Dorshimer, am acting as scribe for Nicholas Lose, MD.  I have reviewed the above documentation for accuracy and completeness, and I agree with the above.

## 2019-01-25 ENCOUNTER — Inpatient Hospital Stay: Payer: Medicare Other | Attending: Hematology and Oncology | Admitting: Hematology and Oncology

## 2019-01-25 DIAGNOSIS — Z923 Personal history of irradiation: Secondary | ICD-10-CM

## 2019-01-25 DIAGNOSIS — C3492 Malignant neoplasm of unspecified part of left bronchus or lung: Secondary | ICD-10-CM

## 2019-01-25 DIAGNOSIS — Z170 Estrogen receptor positive status [ER+]: Secondary | ICD-10-CM | POA: Diagnosis not present

## 2019-01-25 DIAGNOSIS — Z79810 Long term (current) use of selective estrogen receptor modulators (SERMs): Secondary | ICD-10-CM | POA: Diagnosis not present

## 2019-01-25 DIAGNOSIS — C3490 Malignant neoplasm of unspecified part of unspecified bronchus or lung: Secondary | ICD-10-CM | POA: Diagnosis not present

## 2019-01-25 DIAGNOSIS — C50412 Malignant neoplasm of upper-outer quadrant of left female breast: Secondary | ICD-10-CM

## 2019-01-25 DIAGNOSIS — C3491 Malignant neoplasm of unspecified part of right bronchus or lung: Secondary | ICD-10-CM

## 2019-01-28 DIAGNOSIS — N758 Other diseases of Bartholin's gland: Secondary | ICD-10-CM | POA: Diagnosis not present

## 2019-02-02 ENCOUNTER — Encounter: Payer: Self-pay | Admitting: Emergency Medicine

## 2019-02-02 ENCOUNTER — Other Ambulatory Visit: Payer: Self-pay

## 2019-02-02 ENCOUNTER — Ambulatory Visit (INDEPENDENT_AMBULATORY_CARE_PROVIDER_SITE_OTHER): Payer: Medicare Other | Admitting: Emergency Medicine

## 2019-02-02 DIAGNOSIS — C3491 Malignant neoplasm of unspecified part of right bronchus or lung: Secondary | ICD-10-CM

## 2019-02-02 DIAGNOSIS — I701 Atherosclerosis of renal artery: Secondary | ICD-10-CM | POA: Diagnosis not present

## 2019-02-02 DIAGNOSIS — J9601 Acute respiratory failure with hypoxia: Secondary | ICD-10-CM | POA: Diagnosis not present

## 2019-02-02 DIAGNOSIS — J449 Chronic obstructive pulmonary disease, unspecified: Secondary | ICD-10-CM

## 2019-02-02 DIAGNOSIS — C3492 Malignant neoplasm of unspecified part of left bronchus or lung: Secondary | ICD-10-CM

## 2019-02-02 DIAGNOSIS — Z720 Tobacco use: Secondary | ICD-10-CM | POA: Diagnosis not present

## 2019-02-02 MED ORDER — ALBUTEROL SULFATE HFA 108 (90 BASE) MCG/ACT IN AERS: 2.0000 | INHALATION_SPRAY | Freq: Four times a day (QID) | RESPIRATORY_TRACT | 3 refills | Status: DC | PRN

## 2019-02-02 NOTE — Addendum Note (Signed)
Addended by: Collier Salina on: 02/02/2019 12:48 PM   Modules accepted: Orders

## 2019-02-02 NOTE — Patient Instructions (Addendum)
Please continue Anoro 1 inhalation once daily We will write prescription for albuterol. Keep available to use 2 puffs up to every 4 hours if needed for shortness of breath, chest tightness, wheezing.  Walking oximetry today.  We will also arrange for an overnight oximetry on room air at home.  If your oxygen level does not drop then we should be able to get rid of your home oxygen concentrator and tanks. You need to work hard on continuing to decrease your smoking. Follow with [name], Dr. Sondra Come and get your CT scan as planned. Follow with Dr Lamonte Sakai in 6 months or sooner if you have any problems

## 2019-02-02 NOTE — Assessment & Plan Note (Signed)
Follow with [name], Dr Sondra Come and get your CT chest as planned

## 2019-02-02 NOTE — Assessment & Plan Note (Signed)
You need to work hard on continuing to decrease your smoking.

## 2019-02-02 NOTE — Assessment & Plan Note (Signed)
Follow with [name], Dr. Sondra Come and get your CT scan as planned.

## 2019-02-02 NOTE — Assessment & Plan Note (Signed)
Please continue Anoro 1 inhalation once daily We will write prescription for albuterol. Keep available to use 2 puffs up to every 4 hours if needed for shortness of breath, chest tightness, wheezing.

## 2019-02-02 NOTE — Assessment & Plan Note (Signed)
Unclear that she still needs supplemental oxygen.  We will perform a walk today, get ONO.  May be able to discontinue.

## 2019-02-02 NOTE — Progress Notes (Signed)
Subjective:    Patient ID: Kendra Watts, female    DOB: [birth year], 73 y.o.   MRN: [id number]  HPI 73 year old smoker (60 pack years) with a history of L breast cancer (2017) treated with lumpectomy and radiation therapy, renal artery stenosis, hypothyroidism, hypertension, anemia, COPD with severe obstruction noted on pulmonary function testing from 05/14/2017 (FEV1 0.98 L, 52% predicted).  She was admitted to the hospital in late December with an apparent acute exacerbation of her COPD.  She had a CT scan of the chest done on 12/16 during that admission that showed an irregular 2.7 cm nodular focus in the posterior left upper lobe with some surrounding groundglass opacity, a spiculated 1.8 cm solid apical right upper lobe pulmonary nodule.  A subsequent PET scan was done on 07/30/2018 that I have reviewed.  This shows that both the right upper lobe spiculated nodule and the posterior left upper lobe irregular nodule remain, are hypermetabolic.  She is here today for further evaluation of the abnormalities on CT.   Current bronchodilator therapy is Anoro.  She uses albuterol approximately.  She is on Plavix and aspirin 81 mg. She was sent home from hospital with O2. She has been active, walks her dog. Able to do her ADL and shopping.   ROV 09/14/2018 --follow-up visit for patient with a history of tobacco, left-sided breast cancer (treated) RAS, hypothyroidism, COPD with severe obstruction on pulmonary function testing.  I took her for navigational bronchoscopy on 2/12 to better evaluate bilateral upper lobe pulmonary nodules that were hypermetabolic on PET scan.  Biopsies revealed squamous cell on the left and adenocarcinoma on the right.  Fiducials were placed around each.  I had her restart her Plavix a few days post procedure, but she has continued to have some hemoptysis so I asked her to stop it again on 2/21. She is having some blood from her nose, had to call EMS for this 2/21, but got it under  control. Has been worked on before by ENT.   She denies any significant SOB, can happen w heavier exertion. She is on Anoro - has been benefiting from it. She is smoking about   MRI brain from 09/08/2018 does not show any evidence of metastatic disease. She has an appt 09/21/18 with Dr [name].    ROV 02/02/2019 --Kendra Watts is 35 and has a history of severe COPD, treated left-sided breast cancer, bilateral primary lung cancers (squamous cell on the left, adenocarcinoma on the right) diagnosed by [facility].  She is being followed by Dr [name] and Dr Sondra Come and completed SBRT in April. Her epistaxis has resolved, remains off plavix. Next CT chest in August. She has had some fatigue. She has been sleeping with oxygen, doesn't use during the day (but has O2 left over from hospitalization). She is on Anoro, feels some benefit. She doesn't have albuterol.    Review of Systems   Past Medical History:  Diagnosis Date  . Anemia   . 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 Breast cancer ([place]) 09/22/2015   Malignant  . Breast cancer of upper-outer quadrant of left female breast ([place]) 09/08/2015  . Cataract, immature   . CHF (congestive heart failure) ([place])    Acute CHF-06/2018  . COPD (chronic obstructive pulmonary disease) ([place])   . Heart murmur    states no known problems  . History of hyperthyroidism   . Hyperlipidemia   . Hypertension    states under control with meds.,  has been on med. x "long time"  . Hypokalemia    from last physical.   . Hypothyroidism   . Nonfunctioning kidney    left  . Personal history of radiation therapy    2017  . Pulmonary nodules    Bilateral  . Radiation 10/30/15-11/28/15   left breast 42.72 Gy, boosted to 10 Gy  . Renal artery stenosis ([place])   . Tobacco abuse   . Wears partial dentures    upper and lower     Family History  Problem Relation Age of Onset  . Heart disease Mother        MI @ 24, died at 33  . Cancer Father   . Lung cancer Father   . Heart  disease Maternal Grandmother   . Lung cancer Sister   . Breast cancer Paternal Aunt      Social History   Socioeconomic History  . Marital status: Divorced    Spouse name: Not on file  . Number of children: 0  . Years of education: Not on file  . Highest education level: Not on file  Occupational History  . 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 Financial resource strain: Not on file  . Food insecurity    Worry: Not on file    Inability: Not on file  . Transportation needs    Medical: Not on file    Non-medical: Not on file  Tobacco Use  . Smoking status: Current Every Day Smoker    Packs/day: 1.50    Years: 40.00    Pack years: 60.00    Types: Cigarettes  . Smokeless tobacco: Never Used  . Tobacco comment: as of 09/14/2018, less than 1 pack/day  Substance and Sexual Activity  . Alcohol use: Yes    Comment: occasionally  . Drug use: No  . Sexual activity: Never  Lifestyle  . Physical activity    Days per week: Not on file    Minutes per session: Not on file  . Stress: Not on file  Relationships  . Social Herbalist on phone: Not on file    Gets together: Not on file    Attends religious service: Not on file    Active member of club or organization: Not on file    Attends meetings of clubs or organizations: Not on file    Relationship status: Not on file  . Intimate partner violence    Fear of current or ex partner: Not on file    Emotionally abused: Not on file    Physically abused: Not on file    Forced sexual activity: Not on file  Other Topics Concern  . Not on file  Social History Narrative  . Not on file     No Known Allergies   Outpatient Medications Prior to Visit  Medication Sig Dispense Refill  . aspirin 81 MG tablet Take 81 mg by mouth daily.    . Calcium-Magnesium-Vitamin D (CALCIUM 1200+D3 PO) Take 1 tablet by mouth daily.    Marland Kitchen doxazosin (CARDURA) 8 MG tablet Take 8 mg by mouth daily.     .  furosemide (LASIX) 20 MG tablet Take 1 tablet (20 mg total) by mouth daily. 30 tablet 0  . levothyroxine (SYNTHROID, LEVOTHROID) 88 MCG tablet Take 88 mcg by mouth daily before breakfast.    . Multiple Vitamins-Minerals (MULTIVITAMIN WITH MINERALS) tablet Take 1 tablet by mouth daily.     Marland Kitchen  Naphazoline HCl (CLEAR EYES OP) Place 2 drops into both eyes daily.    . pantoprazole (PROTONIX) 40 MG tablet Take 1 tablet (40 mg total) by mouth daily. 30 tablet 0  . rosuvastatin (CRESTOR) 10 MG tablet Take 10 mg by mouth daily.     . tamoxifen (NOLVADEX) 20 MG tablet TAKE 1 TABLET BY MOUTH  DAILY 90 tablet 3  . umeclidinium-vilanterol (ANORO ELLIPTA) 62.5-25 MCG/INH AEPB Inhale 1 puff into the lungs daily.     No facility-administered medications prior to visit.         Objective:   Physical Exam Vitals:   02/02/19 1208  BP: 114/80  Pulse: 86  SpO2: 95%  Weight: 95 lb (43.1 kg)  Height: 5' (1.524 m)   Gen: Pleasant, thin, in no distress,  normal affect  ENT: No lesions,  mouth clear,  oropharynx clear,   Neck: No JVD, no stridor  Lungs: No use of accessory muscles, distant, no crackles or wheezing on normal respiration, no wheeze on forced expiration  Cardiovascular: RRR, heart sounds normal, no murmur or gallops, no peripheral edema  Musculoskeletal: No deformities, no cyanosis or clubbing  Neuro: alert, awake, non focal  Skin: Warm, no lesions or rash      Assessment & Plan:  Chronic obstructive pulmonary disease ([place]) Please continue Anoro 1 inhalation once daily We will write prescription for albuterol. Keep available to use 2 puffs up to every 4 hours if needed for shortness of breath, chest tightness, wheezing.    Acute respiratory failure with hypoxia ([place]) Unclear that she still needs supplemental oxygen.  We will perform a walk today, get ONO.  May be able to discontinue.  Squamous cell lung cancer, left ([place]) Follow with [name], Dr. Sondra Come and get your CT scan  as planned.    Adenocarcinoma, lung, right ([place]) Follow with [name], Dr Sondra Come and get your CT chest as planned  Tobacco abuse You need to work hard on continuing to decrease your smoking.   Baltazar Apo, MD, PhD 02/02/2019, 12:41 PM [facility] [phone] or if no answer [phone]

## 2019-02-09 ENCOUNTER — Encounter: Payer: Self-pay | Admitting: Physical Therapy

## 2019-02-09 ENCOUNTER — Other Ambulatory Visit: Payer: Self-pay

## 2019-02-09 ENCOUNTER — Ambulatory Visit: Payer: Medicare Other | Attending: Hematology and Oncology | Admitting: Physical Therapy

## 2019-02-09 DIAGNOSIS — I890 Lymphedema, not elsewhere classified: Secondary | ICD-10-CM | POA: Insufficient documentation

## 2019-02-09 DIAGNOSIS — L599 Disorder of the skin and subcutaneous tissue related to radiation, unspecified: Secondary | ICD-10-CM | POA: Insufficient documentation

## 2019-02-09 NOTE — Therapy (Signed)
[place], [place], [address] Phone: [phone]   Fax:  [phone]  Physical Therapy Evaluation  Patient Details  Name: Kendra Watts MRN: [id number] Date of Birth: [birth year] Referring Provider (PT): [name]   Encounter Date: 02/09/2019  PT End of Session - 02/09/19 1349    Visit Number  1    Number of Visits  9    Date for PT Re-Evaluation  03/09/19    PT Start Time  8875    PT Stop Time  1345    PT Time Calculation (min)  32 min    Activity Tolerance  Patient tolerated treatment well    Behavior During Therapy  [facility] for tasks assessed/performed       Past Medical History:  Diagnosis Date  . Anemia   . 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 Breast cancer ([place]) 09/22/2015   Malignant  . Breast cancer of upper-outer quadrant of left female breast ([place]) 09/08/2015  . Cataract, immature   . CHF (congestive heart failure) ([place])    Acute CHF-06/2018  . COPD (chronic obstructive pulmonary disease) ([place])   . Heart murmur    states no known problems  . History of hyperthyroidism   . Hyperlipidemia   . Hypertension    states under control with meds., has been on med. x "long time"  . Hypokalemia    from last physical.   . Hypothyroidism   . Nonfunctioning kidney    left  . Personal history of radiation therapy    2017  . Pulmonary nodules    Bilateral  . Radiation 10/30/15-11/28/15   left breast 42.72 Gy, boosted to 10 Gy  . Renal artery stenosis ([place])   . Tobacco abuse   . Wears partial dentures    upper and lower    Past Surgical History:  Procedure Laterality Date  . ABDOMINAL ANGIOGRAM  02/18/2012   Procedure: ABDOMINAL ANGIOGRAM;  Surgeon: Lorretta Harp, MD;  Location: [facility] CATH LAB;  Service: Cardiovascular;;  . ABDOMINAL AORTAGRAM  07/04/2014  . ABDOMINAL HYSTERECTOMY  ~ 1977   partial  . APPENDECTOMY    . ARCH AORTOGRAM    . BREAST LUMPECTOMY Left 09/22/2015    Malignant  . CAROTID ANGIOGRAM N/A 02/18/2012   Procedure: CAROTID ANGIOGRAM;  Surgeon: Lorretta Harp, MD;  Location: [facility] CATH LAB;  Service: Cardiovascular;  Laterality: N/A;  . ENDARTERECTOMY  04/02/2012   Procedure: ENDARTERECTOMY CAROTID;  Surgeon: Serafina Mitchell, MD;  Location: [facility] OR;  Service: Vascular;  Laterality: Right;  . FUDUCIAL PLACEMENT Bilateral 09/02/2018   Procedure: Placement Of Fiducial to right upper lobe & left upper lobe lung;  Surgeon: [name], MD;  [facility] OR;  Service: Thoracic;  Laterality: Bilateral;  . IR THORACENTESIS ASP PLEURAL SPACE W/IMG GUIDE  07/08/2018  . RADIOACTIVE SEED GUIDED PARTIAL MASTECTOMY WITH AXILLARY SENTINEL LYMPH NODE BIOPSY Left 09/22/2015   Procedure: INJECT BLUE DYE LEFT BREAST,RADIOACTIVE SEED GUIDED PARTIAL MASTECTOMY WITH AXILLARY SENTINEL LYMPH NODE BIOPSY;  Surgeon: Fanny Skates, MD;  Location: [place];  Service: General;  Laterality: Left;  . RENAL ANGIOGRAM Left 06/08/2010   renal artery stent -  5x12 Genesis on Aviator balloon stent (Dr. Adora Fridge)  . RENAL ANGIOGRAM Right 07/04/2014   Procedure: RENAL ANGIOGRAM;  Surgeon: Lorretta Harp, MD;  Location: [facility] CATH LAB;  Service: Cardiovascular;  Laterality: Right;  . RENAL ANGIOGRAM Right 08/22/2014  Procedure: RENAL ANGIOGRAM;  Surgeon: Lorretta Harp, MD;  Location: [facility] CATH LAB;  Service: Cardiovascular;  Laterality: Right;  . TONSILLECTOMY     as a child  . VIDEO BRONCHOSCOPY WITH ENDOBRONCHIAL NAVIGATION N/A 09/02/2018   Procedure: VIDEO BRONCHOSCOPY WITH ENDOBRONCHIAL NAVIGATION;  Surgeon: [name], MD;  [facility];  Service: Thoracic;  Laterality: N/A;    There were no vitals filed for this visit.   Subjective Assessment - 02/09/19 1316    Subjective  The swelling has gradually built up in my left breast. It is not in my left arm. I don't think I am doing a good job with the massage.    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  It is ER/PR positive and HER2 negative., pt underwent left lumpectomy with sentinel lymph node biopsy, pt completed radiation    Patient Stated Goals  to get the swelling under control    Currently in Pain?  No/denies         [facility] PT Assessment - 02/09/19 0001      Assessment   Medical Diagnosis  Left breast cancer    Referring Provider (PT)  Gudena    Onset Date/Surgical Date  09/22/15    Hand Dominance  Left    Prior Therapy  lymphedema therapy in 2017 for left breast swelling      Precautions   Precautions  Other (comment)    Precaution Comments  CHF, COPD, at risk for lymphedema      Restrictions   Weight Bearing Restrictions  No      Balance Screen   Has the patient fallen in the past 6 months  No    Has the patient had a decrease in activity level because of a fear of falling?   No    Is the patient reluctant to leave their home because of a fear of falling?   No      Home Environment   Living Environment  Private residence    Living Arrangements  Alone    Available Help at Discharge  Friend(s)    Type of [place] Access  Level entry    [place]  One level    [place]  None      Prior Function   Level of [place]  Retired    Leisure  She does not exercise      Cognition   Overall Cognitive Status  Within Functional Limits for tasks assessed      Observation/Other Assessments   Observations  fullness over left breast     Other Surveys   --      Posture/Postural Control   Posture/Postural Control  Postural limitations    Postural Limitations  Rounded Shoulders;Forward head;Increased thoracic kyphosis      AROM   Right Shoulder Flexion  148 Degrees    Right Shoulder ABduction  162 Degrees    Right Shoulder Internal Rotation   62 Degrees    Right Shoulder External Rotation  71 Degrees    Left Shoulder Flexion  147 Degrees    Left Shoulder ABduction  175 Degrees    Left Shoulder Internal Rotation  66 Degrees    Left Shoulder External Rotation  81 Degrees  Strength   Overall Strength  --        LYMPHEDEMA/ONCOLOGY QUESTIONNAIRE - 02/09/19 1330      Right Upper Extremity Lymphedema   10 cm Proximal to Olecranon Process  19 cm    Olecranon Process  18.8 cm    10 cm Proximal to Ulnar Styloid Process  15 cm    Just Proximal to Ulnar Styloid Process  13 cm    Across Hand at [employer]  17.2 cm    At [place] of 2nd Digit  5.4 cm      Left Upper Extremity Lymphedema   10 cm Proximal to Olecranon Process  18 cm    Olecranon Process  19 cm    10 cm Proximal to Ulnar Styloid Process  15 cm    Just Proximal to Ulnar Styloid Process  12.6 cm    Across Hand at [employer]  17 cm    At [place] of 2nd Digit  5.8 cm             Outpatient Rehab from 02/09/2019 in [facility]  Lymphedema Life Impact Scale Total Score  10.29 %      Objective measurements completed on examination: See above findings.      Ault Adult PT Treatment/Exercise - 02/09/19 0001      Manual Therapy   Edema Management  created foam chip pack for pt to wear in her bra                  PT Long Term Goals - 02/09/19 1354      PT LONG TERM GOAL #1   Title  Pt will be independent with self MLD for long term management of left breast lymphedema    Time  4    Period  Weeks    Status  New    Target Date  03/09/19      PT LONG TERM GOAL #2   Title  Pt will obtain a better fitting bra that she can wear chip pack in to get targeted compression over area of swelling    Time  4    Period  Weeks    Status  New    Target Date  03/09/19      PT LONG TERM GOAL #3   Title  Pt will report a 50% reduction in swelling in left breast to allow for improved comfort    Time  4    Period   Weeks    Status  New    Target Date  03/09/19             Plan - 02/09/19 1350    Clinical Impression Statement  Pt presents to PT with left breast lymphedema with significant asymmetry. Pt was seen in this clinic in 2017 for the same issue. She has since been diagnosed with CHF and COPD as well as stage 1 lung cancer that has been treated with radiation. She is unable to tolerate a compression bra due to her breathing issues. Created chip pack for pt to wear in her bra. Pt demonstrates WFL shoulder ROM. Pt would benefit from skiled PT services to decrease left breast swelling and progress pt towards indepedent management.    Personal Factors and Comorbidities  Comorbidity 1;Comorbidity 2;Comorbidity 3+    Comorbidities  CHF, COPD, recent lung cancer diagnosis    Examination-Activity Limitations  Sleep   has discomfort when rolling over in bed  Stability/Clinical Decision Making  Evolving/Moderate complexity    Clinical Decision Making  Moderate    Rehab Potential  Good    Clinical Impairments Affecting Rehab Potential  hx of radiation    PT Frequency  2x / week    PT Duration  4 weeks    PT Treatment/Interventions  ADLs/Self Care Home Management;Therapeutic exercise;Orthotic Fit/Training;Manual techniques;Manual lymph drainage;Patient/family education;Scar mobilization;Passive range of motion;Taping    PT Next Visit Plan  begin MLD to left breast, see if pt has been back to [facility] [place], see if chip pack is helping    Consulted and Agree with Plan of Care  Patient       Patient will benefit from skilled therapeutic intervention in order to improve the following deficits and impairments:  Increased edema, Decreased scar mobility, Pain  Visit Diagnosis: 1. Lymphedema, not elsewhere classified   2. Disorder of the skin and subcutaneous tissue related to radiation, unspecified        Problem List Patient Active Problem List   Diagnosis Date Noted  . Hemoptysis 09/14/2018   . Squamous cell lung cancer, left ([place]) 09/10/2018  . Adenocarcinoma, lung, right ([place]) 09/10/2018  . Multiple lung nodules 07/20/2018  . Chronic obstructive pulmonary disease ([place]) 07/20/2018  . Acute CHF (congestive heart failure) ([place]) 07/06/2018  . Acute respiratory failure with hypoxia ([place]) 07/06/2018  . Hyponatremia 07/06/2018  . Anemia 07/06/2018  . Breast cancer of upper-outer quadrant of left female breast ([place]) 09/08/2015  . Renal artery stenosis ([place]) 08/23/2014  . Right upper extremity numbness 07/01/2013  . Atherosclerotic renal artery stenosis, bilateral ([place]) 05/26/2013  . Peripheral arterial disease ([place]) 05/26/2013  . Essential hypertension 05/26/2013  . Hyperlipidemia 05/26/2013  . Tobacco abuse 05/26/2013  . Carotid artery stenosis 03/09/2012    Allyson Sabal [facility] 02/09/2019, 1:56 PM  [place], [place], [address] Phone: [phone]   Fax:  [phone]  Name: Kendra Watts MRN: [id number] Date of Birth: 04-14-1946  Manus Gunning, PT 02/09/19 1:56 PM

## 2019-02-11 ENCOUNTER — Ambulatory Visit: Payer: Medicare Other | Admitting: Emergency Medicine

## 2019-02-11 ENCOUNTER — Other Ambulatory Visit: Payer: Self-pay

## 2019-02-11 ENCOUNTER — Encounter: Payer: Self-pay | Admitting: Physical Therapy

## 2019-02-11 ENCOUNTER — Ambulatory Visit: Payer: Medicare Other | Admitting: Physical Therapy

## 2019-02-11 DIAGNOSIS — I890 Lymphedema, not elsewhere classified: Secondary | ICD-10-CM

## 2019-02-11 DIAGNOSIS — L599 Disorder of the skin and subcutaneous tissue related to radiation, unspecified: Secondary | ICD-10-CM | POA: Diagnosis not present

## 2019-02-11 NOTE — Therapy (Signed)
[place], [place], [address] Phone: [phone]   Fax:  [phone]  Physical Therapy Treatment  Patient Details  Name: Kendra Watts MRN: [id number] Date of Birth: [birth year] Referring Provider (PT): [name]   Encounter Date: 02/11/2019  PT End of Session - 02/11/19 1425    Visit Number  2    Number of Visits  9    Date for PT Re-Evaluation  03/09/19    PT Start Time  1330    PT Stop Time  1415    PT Time Calculation (min)  45 min    Activity Tolerance  Patient tolerated treatment well    Behavior During Therapy  [facility] for tasks assessed/performed       Past Medical History:  Diagnosis Date  . Anemia   . 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 Breast cancer ([place]) 09/22/2015   Malignant  . Breast cancer of upper-outer quadrant of left female breast ([place]) 09/08/2015  . Cataract, immature   . CHF (congestive heart failure) ([place])    Acute CHF-06/2018  . COPD (chronic obstructive pulmonary disease) ([place])   . Heart murmur    states no known problems  . History of hyperthyroidism   . Hyperlipidemia   . Hypertension    states under control with meds., has been on med. x "long time"  . Hypokalemia    from last physical.   . Hypothyroidism   . Nonfunctioning kidney    left  . Personal history of radiation therapy    2017  . Pulmonary nodules    Bilateral  . Radiation 10/30/15-11/28/15   left breast 42.72 Gy, boosted to 10 Gy  . Renal artery stenosis ([place])   . Tobacco abuse   . Wears partial dentures    upper and lower    Past Surgical History:  Procedure Laterality Date  . ABDOMINAL ANGIOGRAM  02/18/2012   Procedure: ABDOMINAL ANGIOGRAM;  Surgeon: Lorretta Harp, MD;  Location: [facility] CATH LAB;  Service: Cardiovascular;;  . ABDOMINAL AORTAGRAM  07/04/2014  . ABDOMINAL HYSTERECTOMY  ~ 1977   partial  . APPENDECTOMY    . ARCH AORTOGRAM    . BREAST LUMPECTOMY Left 09/22/2015   Malignant  . CAROTID ANGIOGRAM N/A 02/18/2012   Procedure: CAROTID ANGIOGRAM;  Surgeon: Lorretta Harp, MD;  Location: [facility] CATH LAB;  Service: Cardiovascular;  Laterality: N/A;  . ENDARTERECTOMY  04/02/2012   Procedure: ENDARTERECTOMY CAROTID;  Surgeon: Serafina Mitchell, MD;  Location: [facility] OR;  Service: Vascular;  Laterality: Right;  . FUDUCIAL PLACEMENT Bilateral 09/02/2018   Procedure: Placement Of Fiducial to right upper lobe & left upper lobe lung;  Surgeon: [name], MD;  [facility];  Service: Thoracic;  Laterality: Bilateral;  . IR THORACENTESIS ASP PLEURAL SPACE W/IMG GUIDE  07/08/2018  . RADIOACTIVE SEED GUIDED PARTIAL MASTECTOMY WITH AXILLARY SENTINEL LYMPH NODE BIOPSY Left 09/22/2015   Procedure: INJECT BLUE DYE LEFT BREAST,RADIOACTIVE SEED GUIDED PARTIAL MASTECTOMY WITH AXILLARY SENTINEL LYMPH NODE BIOPSY;  Surgeon: Fanny Skates, MD;  Location: [place];  Service: General;  Laterality: Left;  . RENAL ANGIOGRAM Left 06/08/2010   renal artery stent -  5x12 Genesis on Aviator balloon stent (Dr. Adora Fridge)  . RENAL ANGIOGRAM Right 07/04/2014   Procedure: RENAL ANGIOGRAM;  Surgeon: Lorretta Harp, MD;  Location: [facility] CATH LAB;  Service: Cardiovascular;  Laterality: Right;  . RENAL ANGIOGRAM Right 08/22/2014   Procedure:  RENAL ANGIOGRAM;  Surgeon: Lorretta Harp, MD;  Location: [facility] CATH LAB;  Service: Cardiovascular;  Laterality: Right;  . TONSILLECTOMY     as a child  . VIDEO BRONCHOSCOPY WITH ENDOBRONCHIAL NAVIGATION N/A 09/02/2018   Procedure: VIDEO BRONCHOSCOPY WITH ENDOBRONCHIAL NAVIGATION;  Surgeon: [name], MD;  [facility];  Service: Thoracic;  Laterality: N/A;    There were no vitals filed for this visit.  Subjective Assessment - 02/11/19 1419    Subjective  Pt says she has not been wearing the chip pack. She is not able to wear her bra at night because she has trouble breathing.  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  It is ER/PR positive and HER2 negative., pt underwent left lumpectomy with sentinel lymph node biopsy, pt completed radiation    Currently in Pain?  No/denies                  Outpatient Re[facility]09/[facility]  Lymphedema Life Impact Scale Total Score  10.29 %           [facility] Adult PT Treatment/Exercise - 02/11/19 0001      Manual Therapy   Manual Therapy  Manual Lymphatic Drainage (MLD);Taping    Edema Management  skin kote and kinesiotaping in 4 pronged fan from left lateral breast toward inguinal nodes, instructed pt in how to remove tape. Encouraged pt to call [facility] for appt for compression bra and to look at the [place] Lymphatic Drainage (MLD)  in supine short neck and diaphragmatic breathing, left inguinal nodes. left breast, uppper chest and abdomen, the to sidelying for posterior interaxillay anastamosis and lateral chest and back, then back to supine, for instruction in hand placement for pt to do self MLD to abodmen and breast    avoided right chest/ axillary nodes due to recent radiation            PT Education - 02/11/19 1423    Education Details  began instruction in self MLD and instructed pt in how to remove kinesiotape and          PT Long Term Goals - 02/09/19 1354      PT LONG TERM GOAL #1   Title  Pt will be independent with self MLD for long term management of left breast lymphedema    Time  4    Period  Weeks    Status  New    [place] Date  03/09/19      PT LONG TERM GOAL #2   Title  Pt will obtain a better fitting bra that she can wear chip pack in to get targeted compression over area of swelling    Time  4    Period  Weeks    Status   New    Target Date  03/09/19      PT LONG TERM GOAL #3   Title  Pt will report a 50% reduction in swelling in left breast to allow for improved comfort    Time  4    Period  Weeks    Status  New    Target Date  03/09/19  Plan - 02/11/19 1426    Clinical Impression Statement  Pt had good response to initial MLD with palpable and visible softening of left breast during session. Encouraged pt to wear chip pack and get a compression bra.  She knows to remove k-tape at any sign of irritation Pt hopefull she will not need many sessions as she has difficulty tolerating a mask    Comorbidities  CHF, COPD, recent lung cancer diagnosis    PT Frequency  2x / week    PT Duration  4 weeks    PT Next Visit Plan  continue  MLD to left breast and teach self care  see if pt has been back to [facility] [place], see if chip pack is helping       Patient will benefit from skilled therapeutic intervention in order to improve the following deficits and impairments:  Increased edema, Decreased scar mobility, Pain  Visit Diagnosis: 1. Lymphedema, not elsewhere classified   2. Disorder of the skin and subcutaneous tissue related to radiation, unspecified        Problem List Patient Active Problem List   Diagnosis Date Noted  . Hemoptysis 09/14/2018  . Squamous cell lung cancer, left ([place]) 09/10/2018  . Adenocarcinoma, lung, right ([place]) 09/10/2018  . Multiple lung nodules 07/20/2018  . Chronic obstructive pulmonary disease ([place]) 07/20/2018  . Acute CHF (congestive heart failure) ([place]) 07/06/2018  . Acute respiratory failure with hypoxia ([place]) 07/06/2018  . Hyponatremia 07/06/2018  . Anemia 07/06/2018  . Breast cancer of upper-outer quadrant of left female breast ([place]) 09/08/2015  . Renal artery stenosis ([place]) 08/23/2014  . Right upper extremity numbness 07/01/2013  . Atherosclerotic renal artery stenosis, bilateral ([place]) 05/26/2013  . Peripheral arterial disease ([place]) 05/26/2013  .  Essential hypertension 05/26/2013  . Hyperlipidemia 05/26/2013  . Tobacco abuse 05/26/2013  . Carotid artery stenosis 03/09/2012   Donato Heinz. Owens Shark PT  Norwood Levo 02/11/2019, 2:29 PM  [place], [place], [address] Phone: [phone]   Fax:  [phone]  Name: Kendra Watts MRN: [id number] Date of Birth: [birth year]

## 2019-02-11 NOTE — Patient Instructions (Signed)
First of all, check with your insurance company to see if provider is in [place] (for wigs and compression sleeves / gloves/gauntlets )  [place], [place] [address] [phone]  Will file some insurances --- call for appointment   [facility] (for mastectomy prosthetics and garments) [place], [place] [address] [phone] Will file some insurances --- call for appointment  [facility]  [address]  [place], [place] [address] [phone] Lower extremity garments  [facility] [place], [place]  [address] [place] ( Medicaid certified lymphedema fitter) [phone] [email or web address]  [place]  [place], [employer] [address] [phone]  Other Resources: National Lymphedema Network:  www.lymphnet.org www.Klosetraining.com for patient articles and self manual lymph drainage information www.lymphedemablog.com has informative articles.  [email or web address] [email or web address].com [email or web address] [email or web address]

## 2019-02-16 ENCOUNTER — Other Ambulatory Visit: Payer: Self-pay

## 2019-02-16 ENCOUNTER — Ambulatory Visit: Payer: Medicare Other | Admitting: Physical Therapy

## 2019-02-16 ENCOUNTER — Encounter: Payer: Self-pay | Admitting: Physical Therapy

## 2019-02-16 DIAGNOSIS — L599 Disorder of the skin and subcutaneous tissue related to radiation, unspecified: Secondary | ICD-10-CM

## 2019-02-16 DIAGNOSIS — I890 Lymphedema, not elsewhere classified: Secondary | ICD-10-CM | POA: Diagnosis not present

## 2019-02-16 NOTE — Therapy (Signed)
[place], [place], [address] Phone: [phone]   Fax:  [phone]  Physical Therapy Treatment  Patient Details  Name: Kendra Watts MRN: [id number] Date of Birth: 14-Mar-1946 Referring Provider (PT): [name]   Encounter Date: 02/16/2019  PT End of Session - 02/16/19 1321    Visit Number  3    Date for PT Re-Evaluation  03/09/19    PT Start Time  1230    PT Stop Time  1315    PT Time Calculation (min)  45 min    Activity Tolerance  Patient tolerated treatment well    Behavior During Therapy  [facility] for tasks assessed/performed       Past Medical History:  Diagnosis Date  . Anemia   . 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 Breast cancer ([place]) 09/22/2015   Malignant  . Breast cancer of upper-outer quadrant of left female breast ([place]) 09/08/2015  . Cataract, immature   . CHF (congestive heart failure) ([place])    Acute CHF-06/2018  . COPD (chronic obstructive pulmonary disease) ([place])   . Heart murmur    states no known problems  . History of hyperthyroidism   . Hyperlipidemia   . Hypertension    states under control with meds., has been on med. x "long time"  . Hypokalemia    from last physical.   . Hypothyroidism   . Nonfunctioning kidney    left  . Personal history of radiation therapy    2017  . Pulmonary nodules    Bilateral  . Radiation 10/30/15-11/28/15   left breast 42.72 Gy, boosted to 10 Gy  . Renal artery stenosis ([place])   . Tobacco abuse   . Wears partial dentures    upper and lower    Past Surgical History:  Procedure Laterality Date  . ABDOMINAL ANGIOGRAM  02/18/2012   Procedure: ABDOMINAL ANGIOGRAM;  Surgeon: Lorretta Harp, MD;  Location: [facility] CATH LAB;  Service: Cardiovascular;;  . ABDOMINAL AORTAGRAM  07/04/2014  . ABDOMINAL HYSTERECTOMY  ~ 1977   partial  . APPENDECTOMY    . ARCH AORTOGRAM    . BREAST LUMPECTOMY Left 09/22/2015   Malignant  . CAROTID ANGIOGRAM  N/A 02/18/2012   Procedure: CAROTID ANGIOGRAM;  Surgeon: Lorretta Harp, MD;  Location: [facility] CATH LAB;  Service: Cardiovascular;  Laterality: N/A;  . ENDARTERECTOMY  04/02/2012   Procedure: ENDARTERECTOMY CAROTID;  Surgeon: Serafina Mitchell, MD;  Location: [facility] OR;  Service: Vascular;  Laterality: Right;  . FUDUCIAL PLACEMENT Bilateral 09/02/2018   Procedure: Placement Of Fiducial to right upper lobe & left upper lobe lung;  Surgeon: [name], MD;  [facility]: [facility] OR;  Service: Thoracic;  Laterality: Bilateral;  . IR THORACENTESIS ASP PLEURAL SPACE W/IMG GUIDE  07/08/2018  . RADIOACTIVE SEED GUIDED PARTIAL MASTECTOMY WITH AXILLARY SENTINEL LYMPH NODE BIOPSY Left 09/22/2015   Procedure: INJECT BLUE DYE LEFT BREAST,RADIOACTIVE SEED GUIDED PARTIAL MASTECTOMY WITH AXILLARY SENTINEL LYMPH NODE BIOPSY;  Surgeon: Fanny Skates, MD;  Location: [facility];  Service: General;  Laterality: Left;  . RENAL ANGIOGRAM Left 06/08/2010   renal artery stent -  5x12 Genesis on Aviator balloon stent (Dr. Adora Fridge)  . RENAL ANGIOGRAM Right 07/04/2014   Procedure: RENAL ANGIOGRAM;  Surgeon: Lorretta Harp, MD;  Location: [facility] CATH LAB;  Service: Cardiovascular;  Laterality: Right;  . RENAL ANGIOGRAM Right 08/22/2014   Procedure: RENAL ANGIOGRAM;  Surgeon: Lorretta Harp,  MD;  Location: [facility] CATH LAB;  Service: Cardiovascular;  Laterality: Right;  . TONSILLECTOMY     as a child  . VIDEO BRONCHOSCOPY WITH ENDOBRONCHIAL NAVIGATION N/A 09/02/2018   Procedure: VIDEO BRONCHOSCOPY WITH ENDOBRONCHIAL NAVIGATION;  Surgeon: [name], MD;  [facility] OR;  Service: Thoracic;  Laterality: N/A;    There were no vitals filed for this visit.  Subjective Assessment - 02/16/19 1318    Subjective  Pt comes in with kinesiotape still intact.  She says she has been wearing the chip pack at home and trying the manual lymph drainage but she needs more instructions. She is still struggling with wearing the mask in the  heat.  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  It is ER/PR positive and HER2 negative., pt underwent left lumpectomy with sentinel lymph node biopsy, pt completed radiation    Currently in Pain?  No/denies                  [facility]patient [facility]09/[facility]  Lymphedema Life Impact Scale Total Score  10.29 %           [facility] Adult PT Treatment/Exercise - 02/16/19 0001      Manual Therapy   Manual Therapy  Manual Lymphatic Drainage (MLD)    Edema Management  used mirror for added visual instruction to patient and hand over hand instruction for reinforcement of MLD techniques.  Pt states she will wear a compression bra she has at home with the chip pack . Encouraged her to wear it in next session so we can assess the effectivemess of it.     Manual Lymphatic Drainage (MLD)  in supine short neck and diaphragmatic breathing, left inguinal nodes. left breast, uppper chest and abdomen, the to sidelying for posterior interaxillay anastamosis and lateral chest and back, then back to supine, for instruction in hand placement for pt to do self MLD to abodmen and breast    avoided right chest/ axillary nodes due to recent radiation                 PT Long Term Goals - 02/09/19 1354      PT LONG TERM GOAL #1   Title  Pt will be independent with self MLD for long term management of left breast lymphedema    Time  4    Period  Weeks    Status  New    Target Date  03/09/19      PT LONG TERM GOAL #2   Title  Pt will obtain a better fitting bra that she can wear chip pack in to get targeted compression over area of swelling    Time  4    Period  Weeks    Status  New    Target Date   03/09/19      PT LONG TERM GOAL #3   Title  Pt will report a 50% reduction in swelling in left breast to allow for improved comfort    Time  4    Period  Weeks    Status  New    Target Date  03/09/19            Plan -  02/16/19 1322    Clinical Impression Statement  Pt continues to have pitting edema in left breast.  Kinesiotape did not effectively manage it so it was not reapplied this session.  Encouraged pt to wear the chip pack and compression bra at home as I feel this will make a difference to her. She said she will wear it and bring it in next session Pt continues to be concerned about difficulty breathing with the mask and compression bra, but was willing to try the bra for a few hours during the day. She knows she will not be able to tolerate it at night.    Comorbidities  CHF, COPD, recent lung cancer diagnosis    Rehab Potential  Good    Clinical Impairments Affecting Rehab Potential  hx of radiation    PT Frequency  2x / week    PT Duration  4 weeks    PT Next Visit Plan  Assess compression bra and chip pack continue  MLD to left breast and teach self care  see if pt has been back to [facility] [place], see if chip pack is helping    Consulted and Agree with Plan of Care  Patient       Patient will benefit from skilled therapeutic intervention in order to improve the following deficits and impairments:  Increased edema, Decreased scar mobility, Pain  Visit Diagnosis: 1. Lymphedema, not elsewhere classified   2. Disorder of the skin and subcutaneous tissue related to radiation, unspecified        Problem List Patient Active Problem List   Diagnosis Date Noted  . Hemoptysis 09/14/2018  . Squamous cell lung cancer, left ([place]) 09/10/2018  . Adenocarcinoma, lung, right ([place]) 09/10/2018  . Multiple lung nodules 07/20/2018  . Chronic obstructive pulmonary disease ([place]) 07/20/2018  . Acute CHF (congestive heart failure) ([place]) 07/06/2018  . Acute respiratory failure  with hypoxia ([place]) 07/06/2018  . Hyponatremia 07/06/2018  . Anemia 07/06/2018  . Breast cancer of upper-outer quadrant of left female breast ([place]) 09/08/2015  . Renal artery stenosis ([place]) 08/23/2014  . Right upper extremity numbness 07/01/2013  . Atherosclerotic renal artery stenosis, bilateral ([place]) 05/26/2013  . Peripheral arterial disease ([place]) 05/26/2013  . Essential hypertension 05/26/2013  . Hyperlipidemia 05/26/2013  . Tobacco abuse 05/26/2013  . Carotid artery stenosis 03/09/2012   Donato Heinz. Owens Shark PT  Norwood Levo 02/16/2019, 1:25 PM  [place], [place], [address] Phone: [phone]   Fax:  [phone]  Name: GELENA KLOSINSKI MRN: [id number] Date of Birth: Mar 01, 1946

## 2019-02-18 ENCOUNTER — Ambulatory Visit: Payer: Medicare Other | Admitting: Physical Therapy

## 2019-02-18 ENCOUNTER — Other Ambulatory Visit: Payer: Self-pay

## 2019-02-18 ENCOUNTER — Encounter: Payer: Self-pay | Admitting: Physical Therapy

## 2019-02-18 DIAGNOSIS — L599 Disorder of the skin and subcutaneous tissue related to radiation, unspecified: Secondary | ICD-10-CM | POA: Diagnosis not present

## 2019-02-18 DIAGNOSIS — I890 Lymphedema, not elsewhere classified: Secondary | ICD-10-CM

## 2019-02-18 NOTE — Patient Instructions (Signed)
Manual Lymph Drainage for Left Breast.  Do daily.  Do slowly. Use flat hands with just enough pressure to stretch the skin. Do not slide over the skin, but move the skin with the hand you're using. Lie down or sit comfortably (in a recliner, for example) to do this.  1) Hug yourself:  cross arms and do circles at collar bones near neck 5-7 times (to "wake up" lots of lymph nodes in this area). 2) Take slow deep breaths, allowing your belly to balloon out as your breathe in, 5x (to "wake up" abdominal lymph nodes to take on extra fluid). 3) Right armpit-stretch skin in small circles to stimulate intact lymph nodes there, 5-7x. 4) Left groin area, at panty line-stretch skin in small circles to stimulate lymph nodes 5-7x. 5) Redirect fluid from left chest toward right armpit (stretch skin starting at left chest in 3-4 spots working toward right armpit) 3-4x across the chest. 6) Redirect fluid from left armpit toward left groin (cup your hand around the curve of your left side and do 3-4 "pumps" from armpit to groin) 3-4x down your side. 7) Draw an imaginary diagonal line from upper outer breast through the nipple area toward lower inner breast.  Direct fluid upward and inward from this line toward the pathway across your upper chest (established in #5).  Do this in three rows to treat all of the upper inner breast tissue, and do each row 3-4x. 8) Then repeat #5 above. 9) Direct fluid to treat all of lower outer breast tissue downward and outward toward pathway established in #6 that is aimed at the left groin. 10)  Then repeat #6 above. 11)  End with repeating #3 and #4 above.   [facility] [address] [place]   [address] [phone]

## 2019-02-18 NOTE — Therapy (Signed)
[place], [place], [address] Phone: [phone]   Fax:  [phone]  Physical Therapy Treatment  Patient Details  Name: Kendra Watts MRN: [id number] Date of Birth: 04-14-1946 Referring Provider (PT): [name]   Encounter Date: 02/18/2019  PT End of Session - 02/18/19 1419    Visit Number  4    Date for PT Re-Evaluation  03/09/19    PT Start Time  6606    PT Stop Time  1419    PT Time Calculation (min)  44 min    Activity Tolerance  Patient tolerated treatment well    Behavior During Therapy  [facility] for tasks assessed/performed       Past Medical History:  Diagnosis Date  . Anemia   . 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 Breast cancer ([place]) 09/22/2015   Malignant  . Breast cancer of upper-outer quadrant of left female breast ([place]) 09/08/2015  . Cataract, immature   . CHF (congestive heart failure) ([place])    Acute CHF-06/2018  . COPD (chronic obstructive pulmonary disease) ([place])   . Heart murmur    states no known problems  . History of hyperthyroidism   . Hyperlipidemia   . Hypertension    states under control with meds., has been on med. x "long time"  . Hypokalemia    from last physical.   . Hypothyroidism   . Nonfunctioning kidney    left  . Personal history of radiation therapy    2017  . Pulmonary nodules    Bilateral  . Radiation 10/30/15-11/28/15   left breast 42.72 Gy, boosted to 10 Gy  . Renal artery stenosis ([place])   . Tobacco abuse   . Wears partial dentures    upper and lower    Past Surgical History:  Procedure Laterality Date  . ABDOMINAL ANGIOGRAM  02/18/2012   Procedure: ABDOMINAL ANGIOGRAM;  Surgeon: Lorretta Harp, MD;  Location: [facility] CATH LAB;  Service: Cardiovascular;;  . ABDOMINAL AORTAGRAM  07/04/2014  . ABDOMINAL HYSTERECTOMY  ~ 1977   partial  . APPENDECTOMY    . ARCH AORTOGRAM    . BREAST LUMPECTOMY Left 09/22/2015   Malignant  . CAROTID ANGIOGRAM  N/A 02/18/2012   Procedure: CAROTID ANGIOGRAM;  Surgeon: Lorretta Harp, MD;  Location: [facility] CATH LAB;  Service: Cardiovascular;  Laterality: N/A;  . ENDARTERECTOMY  04/02/2012   Procedure: ENDARTERECTOMY CAROTID;  Surgeon: Serafina Mitchell, MD;  Location: [facility] OR;  Service: Vascular;  Laterality: Right;  . FUDUCIAL PLACEMENT Bilateral 09/02/2018   Procedure: Placement Of Fiducial to right upper lobe & left upper lobe lung;  Surgeon: [name], MD;  [facility];  Service: Thoracic;  Laterality: Bilateral;  . IR THORACENTESIS ASP PLEURAL SPACE W/IMG GUIDE  07/08/2018  . RADIOACTIVE SEED GUIDED PARTIAL MASTECTOMY WITH AXILLARY SENTINEL LYMPH NODE BIOPSY Left 09/22/2015   Procedure: INJECT BLUE DYE LEFT BREAST,RADIOACTIVE SEED GUIDED PARTIAL MASTECTOMY WITH AXILLARY SENTINEL LYMPH NODE BIOPSY;  Surgeon: Fanny Skates, MD;  Location: [facility];  Service: General;  Laterality: Left;  . RENAL ANGIOGRAM Left 06/08/2010   renal artery stent -  5x12 Genesis on Aviator balloon stent (Dr. Adora Fridge)  . RENAL ANGIOGRAM Right 07/04/2014   Procedure: RENAL ANGIOGRAM;  Surgeon: Lorretta Harp, MD;  Location: [facility] CATH LAB;  Service: Cardiovascular;  Laterality: Right;  . RENAL ANGIOGRAM Right 08/22/2014   Procedure: RENAL ANGIOGRAM;  Surgeon: Lorretta Harp,  MD;  Location: [facility] CATH LAB;  Service: Cardiovascular;  Laterality: Right;  . TONSILLECTOMY     as a child  . VIDEO BRONCHOSCOPY WITH ENDOBRONCHIAL NAVIGATION N/A 09/02/2018   Procedure: VIDEO BRONCHOSCOPY WITH ENDOBRONCHIAL NAVIGATION;  Surgeon: [name], MD;  [facility];  Service: Thoracic;  Laterality: N/A;    There were no vitals filed for this visit.  Subjective Assessment - 02/18/19 1339    Subjective  Pt says that she tried to wear the compression bra and it was just so tight she couldn't breathe.  She is not able to wear it and will get a different one from [facility] [place].She did try to wear it for about 3 hours at  home and wasn't able to tell it made a difference in her swelling becasue it was just too uncomfortable to her ribs she has been trying to do the MLD at home    Pertinent History  Recently diagonsed with stage 1 lung cancer, treated with radiation, COPD, CHF, Patient was diagnosed on 08/17/15 with left Watts grade invasive ductal carcinoma located in the upper outer quadrant measuring 4 mm.  It is ER/PR positive and HER2 negative., pt underwent left lumpectomy with sentinel lymph node biopsy, pt completed radiation    Patient Stated Goals  to get the swelling under control    Currently in Pain?  No/denies                  Outpatient Re[facility] from [facility]/09/[facility]  Lymphedema Life Impact Scale Total Score  10.29 %           [facility] Adult PT Treatment/Exercise - 02/18/19 0001      Manual Therapy   Edema Management  gave pt handout for left breast MLD and used hand over hand instruction for technique in sidelying     Manual Lymphatic Drainage (MLD)  in supine short neck and diaphragmatic breathing, left inguinal nodes. left breast, uppper chest and abdomen, the to sidelying for posterior interaxillay anastamosis and lateral chest and back, then back to supine, for instruction in hand placement for pt to do self MLD to abodmen and breast                   PT Long Term Goals - 02/09/19 1354      PT LONG TERM GOAL #1   Title  Pt will be independent with self MLD for long term management of left breast lymphedema    Time  4    Period  Weeks    Status  New    Target Date  03/09/19      PT LONG TERM GOAL #2   Title  Pt will obtain a better fitting bra that she can wear chip pack in to get targeted compression over area of swelling    Time  4    Period  Weeks    Status  New    Target Date  03/09/19      PT LONG TERM GOAL #3   Title  Pt will report a 50% reduction in swelling in left breast to allow for improved comfort    Time  4     Period  Weeks    Status  New    Target Date  03/09/19            Plan - 02/18/19 1419    Clinical Impression Statement  Pt shows improvement today with softer left  breast when she first came in and continued softening with MLD.  She may not be able to tolerate compression bra due to difficulty with breathing, but she will continue to work on MLD.  She has an appointment conflict next week so will only come one time next week and see if she is able to continue to improve with decreased frequency of PT    Personal Factors and Comorbidities  Comorbidity 1;Comorbidity 2;Comorbidity 3+    Comorbidities  CHF, COPD, recent lung cancer diagnosis    Clinical Impairments Affecting Rehab Potential  hx of radiation    PT Frequency  2x / week    PT Duration  4 weeks    PT Treatment/Interventions  ADLs/Self Care Home Management;Therapeutic exercise;Orthotic Fit/Training;Manual techniques;Manual lymph drainage;Patient/family education;Scar mobilization;Passive range of motion;Taping    PT Next Visit Plan  continue  MLD to left breast and teach self care  see if pt has been back to [facility] [place], see if chip pack is helping       Patient will benefit from skilled therapeutic intervention in order to improve the following deficits and impairments:  Increased edema, Decreased scar mobility, Pain  Visit Diagnosis: 1. Lymphedema, not elsewhere classified   2. Disorder of the skin and subcutaneous tissue related to radiation, unspecified        Problem List Patient Active Problem List   Diagnosis Date Noted  . Hemoptysis 09/14/2018  . Squamous cell lung cancer, left ([place]) 09/10/2018  . Adenocarcinoma, lung, right ([place]) 09/10/2018  . Multiple lung nodules 07/20/2018  . Chronic obstructive pulmonary disease ([place]) 07/20/2018  . Acute CHF (congestive heart failure) ([place]) 07/06/2018  . Acute respiratory failure with hypoxia ([place]) 07/06/2018  . Hyponatremia 07/06/2018  . Anemia 07/06/2018  .  Breast cancer of upper-outer quadrant of left female breast ([place]) 09/08/2015  . Renal artery stenosis ([place]) 08/23/2014  . Right upper extremity numbness 07/01/2013  . Atherosclerotic renal artery stenosis, bilateral ([place]) 05/26/2013  . Peripheral arterial disease ([place]) 05/26/2013  . Essential hypertension 05/26/2013  . Hyperlipidemia 05/26/2013  . Tobacco abuse 05/26/2013  . Carotid artery stenosis 03/09/2012   Donato Heinz. Owens Shark PT  Norwood Levo 02/18/2019, 2:23 PM  [place], [place], [address] Phone: [phone]   Fax:  [phone]  Name: Kendra Watts MRN: [id number] Date of Birth: 06-10-1946

## 2019-02-19 ENCOUNTER — Encounter

## 2019-02-19 ENCOUNTER — Other Ambulatory Visit: Payer: Self-pay | Admitting: *Deleted

## 2019-02-19 DIAGNOSIS — C3492 Malignant neoplasm of unspecified part of left bronchus or lung: Secondary | ICD-10-CM

## 2019-02-23 ENCOUNTER — Encounter: Payer: Self-pay | Admitting: Physical Therapy

## 2019-02-23 ENCOUNTER — Other Ambulatory Visit: Payer: Self-pay

## 2019-02-23 ENCOUNTER — Ambulatory Visit: Payer: Medicare Other | Attending: Hematology and Oncology | Admitting: Physical Therapy

## 2019-02-23 DIAGNOSIS — I890 Lymphedema, not elsewhere classified: Secondary | ICD-10-CM | POA: Insufficient documentation

## 2019-02-23 DIAGNOSIS — L599 Disorder of the skin and subcutaneous tissue related to radiation, unspecified: Secondary | ICD-10-CM | POA: Diagnosis not present

## 2019-02-23 NOTE — Therapy (Signed)
[place], [place], [address] Phone: [phone]   Fax:  [phone]  Physical Therapy Treatment  Patient Details  Name: Kendra Watts MRN: [id number] Date of Birth: [birth year] Referring Provider (PT): [name]   Encounter Date: 02/23/2019  PT End of Session - 02/23/19 1350    Visit Number  5    Number of Visits  9    Date for PT Re-Evaluation  03/09/19    PT Start Time  1301    PT Stop Time  1348    PT Time Calculation (min)  47 min    Activity Tolerance  Patient tolerated treatment well    Behavior During Therapy  [facility] for tasks assessed/performed       Past Medical History:  Diagnosis Date  . Anemia   . 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 Breast cancer ([place]) 09/22/2015   Malignant  . Breast cancer of upper-outer quadrant of left female breast ([place]) 09/08/2015  . Cataract, immature   . CHF (congestive heart failure) ([place])    Acute CHF-06/2018  . COPD (chronic obstructive pulmonary disease) ([place])   . Heart murmur    states no known problems  . History of hyperthyroidism   . Hyperlipidemia   . Hypertension    states under control with meds., has been on med. x "long time"  . Hypokalemia    from last physical.   . Hypothyroidism   . Nonfunctioning kidney    left  . Personal history of radiation therapy    2017  . Pulmonary nodules    Bilateral  . Radiation 10/30/15-11/28/15   left breast 42.72 Gy, boosted to 10 Gy  . Renal artery stenosis ([place])   . Tobacco abuse   . Wears partial dentures    upper and lower    Past Surgical History:  Procedure Laterality Date  . ABDOMINAL ANGIOGRAM  02/18/2012   Procedure: ABDOMINAL ANGIOGRAM;  Surgeon: Lorretta Harp, MD;  Location: [facility] CATH LAB;  Service: Cardiovascular;;  . ABDOMINAL AORTAGRAM  07/04/2014  . ABDOMINAL HYSTERECTOMY  ~ 1977   partial  . APPENDECTOMY    . ARCH AORTOGRAM    . BREAST LUMPECTOMY Left 09/22/2015   Malignant   . CAROTID ANGIOGRAM N/A 02/18/2012   Procedure: CAROTID ANGIOGRAM;  Surgeon: Lorretta Harp, MD;  Location: [facility] CATH LAB;  Service: Cardiovascular;  Laterality: N/A;  . ENDARTERECTOMY  04/02/2012   Procedure: ENDARTERECTOMY CAROTID;  Surgeon: Serafina Mitchell, MD;  Location: [facility] OR;  Service: Vascular;  Laterality: Right;  . FUDUCIAL PLACEMENT Bilateral 09/02/2018   Procedure: Placement Of Fiducial to right upper lobe & left upper lobe lung;  Surgeon: [name], MD;  [facility];  Service: Thoracic;  Laterality: Bilateral;  . IR THORACENTESIS ASP PLEURAL SPACE W/IMG GUIDE  07/08/2018  . RADIOACTIVE SEED GUIDED PARTIAL MASTECTOMY WITH AXILLARY SENTINEL LYMPH NODE BIOPSY Left 09/22/2015   Procedure: INJECT BLUE DYE LEFT BREAST,RADIOACTIVE SEED GUIDED PARTIAL MASTECTOMY WITH AXILLARY SENTINEL LYMPH NODE BIOPSY;  Surgeon: Fanny Skates, MD;  Location: [place];  Service: General;  Laterality: Left;  . RENAL ANGIOGRAM Left 06/08/2010   renal artery stent -  5x12 Genesis on Aviator balloon stent (Dr. Adora Fridge)  . RENAL ANGIOGRAM Right 07/04/2014   Procedure: RENAL ANGIOGRAM;  Surgeon: Lorretta Harp, MD;  Location: [facility] CATH LAB;  Service: Cardiovascular;  Laterality: Right;  . RENAL ANGIOGRAM Right 08/22/2014  Procedure: RENAL ANGIOGRAM;  Surgeon: Lorretta Harp, MD;  Location: [facility] CATH LAB;  Service: Cardiovascular;  Laterality: Right;  . TONSILLECTOMY     as a child  . VIDEO BRONCHOSCOPY WITH ENDOBRONCHIAL NAVIGATION N/A 09/02/2018   Procedure: VIDEO BRONCHOSCOPY WITH ENDOBRONCHIAL NAVIGATION;  Surgeon: [name], MD;  [facility];  Service: Thoracic;  Laterality: N/A;    There were no vitals filed for this visit.  Subjective Assessment - 02/23/19 1303    Subjective  I have been wearing the chip pack in my regular bra at home and I think it is helping.    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  It is ER/PR positive and HER2 negative., pt underwent left lumpectomy with sentinel lymph node biopsy, pt completed radiation    Patient Stated Goals  to get the swelling under control    Currently in Pain?  Yes    Pain Score  5     Pain Location  Shoulder    Pain Orientation  Right    Pain Descriptors / Indicators  Discomfort    Pain Type  Acute pain                  Outpatient [facility]  Lymphedema Life Impact Scale Total Score  10.29 %           [facility] Adult PT Treatment/Exercise - 02/23/19 0001      Manual Therapy   Manual Lymphatic Drainage (MLD)  in supine short neck and diaphragmatic breathing, left inguinal nodes and establishemnt of axillo inguinal pathway, right axillary nodes and establishment of interaxillary pathway. left breast, moving fluid towards pathways then retracing all steps                  PT Long Term Goals - 02/09/19 1354      PT LONG TERM GOAL #1   Title  Pt will be independent with self MLD for long term management of left breast lymphedema    Time  4    Period  Weeks    Status  New    Target Date  03/09/19      PT LONG TERM GOAL #2   Title  Pt will obtain a better fitting bra that she can wear chip pack in to get targeted compression over area of swelling    Time  4    Period  Weeks    Status  New    Target Date  03/09/19      PT LONG TERM GOAL #3   Title  Pt will report a 50% reduction in swelling in left breast to allow for improved comfort    Time  4    Period  Weeks    Status  New    Target Date  03/09/19            Plan - 02/23/19 1350    Clinical Impression Statement  Pt's breast appears softer today. She has been wearing the chip pack while at home in her regular bra (she can not tolerate compression bra) and feels the chip  pack is helping. She still has considerable fibrosis in left breast.    Stability/Clinical Decision Making  Evolving/Moderate complexity    Rehab Potential  Good  Clinical Impairments Affecting Rehab Potential  hx of radiation    PT Frequency  2x / week    PT Duration  4 weeks    PT Treatment/Interventions  ADLs/Self Care Home Management;Therapeutic exercise;Orthotic Fit/Training;Manual techniques;Manual lymph drainage;Patient/family education;Scar mobilization;Passive range of motion;Taping    PT Next Visit Plan  continue  MLD to left breast and teach self care  see if pt has been back to [place], see if chip pack is helping    Consulted and Agree with Plan of Care  Patient       Patient will benefit from skilled therapeutic intervention in order to improve the following deficits and impairments:  Increased edema, Decreased scar mobility, Pain  Visit Diagnosis: 1. Lymphedema, not elsewhere classified        Problem List Patient Active Problem List   Diagnosis Date Noted  . Hemoptysis 09/14/2018  . Squamous cell lung cancer, left ([place]) 09/10/2018  . Adenocarcinoma, lung, right ([place]) 09/10/2018  . Multiple lung nodules 07/20/2018  . Chronic obstructive pulmonary disease ([place]) 07/20/2018  . Acute CHF (congestive heart failure) ([place]) 07/06/2018  . Acute respiratory failure with hypoxia ([place]) 07/06/2018  . Hyponatremia 07/06/2018  . Anemia 07/06/2018  . Breast cancer of upper-outer quadrant of left female breast ([place]) 09/08/2015  . Renal artery stenosis ([place]) 08/23/2014  . Right upper extremity numbness 07/01/2013  . Atherosclerotic renal artery stenosis, bilateral ([place]) 05/26/2013  . Peripheral arterial disease ([place]) 05/26/2013  . Essential hypertension 05/26/2013  . Hyperlipidemia 05/26/2013  . Tobacco abuse 05/26/2013  . Carotid artery stenosis 03/09/2012    Allyson Sabal [facility] 02/23/2019, 1:52 PM  [place], [place], [address] Phone: [phone]   Fax:  [phone]  Name: Kendra Watts MRN: [id number] Date of Birth: 12-09-1945  Manus Gunning, PT 02/23/19 1:52 PM

## 2019-02-24 ENCOUNTER — Inpatient Hospital Stay: Payer: Medicare Other | Attending: Hematology and Oncology

## 2019-02-24 ENCOUNTER — Other Ambulatory Visit: Payer: Self-pay

## 2019-02-24 DIAGNOSIS — C50412 Malignant neoplasm of upper-outer quadrant of left female breast: Secondary | ICD-10-CM | POA: Insufficient documentation

## 2019-02-24 DIAGNOSIS — Z170 Estrogen receptor positive status [ER+]: Secondary | ICD-10-CM | POA: Diagnosis not present

## 2019-02-24 DIAGNOSIS — Z79810 Long term (current) use of selective estrogen receptor modulators (SERMs): Secondary | ICD-10-CM | POA: Diagnosis not present

## 2019-02-24 DIAGNOSIS — Z923 Personal history of irradiation: Secondary | ICD-10-CM | POA: Insufficient documentation

## 2019-02-24 DIAGNOSIS — C3492 Malignant neoplasm of unspecified part of left bronchus or lung: Secondary | ICD-10-CM

## 2019-02-24 DIAGNOSIS — C3490 Malignant neoplasm of unspecified part of unspecified bronchus or lung: Secondary | ICD-10-CM | POA: Diagnosis not present

## 2019-02-24 LAB — CMP (CANCER CENTER ONLY)
ALT: 11 U/L (ref 0–44)
AST: 18 U/L (ref 15–41)
Albumin: 3.9 g/dL (ref 3.5–5.0)
Alkaline Phosphatase: 57 U/L (ref 38–126)
Anion gap: 12 (ref 5–15)
BUN: 20 mg/dL (ref 8–23)
CO2: 26 mmol/L (ref 22–32)
Calcium: 9.4 mg/dL (ref 8.9–10.3)
Chloride: 102 mmol/L (ref 98–111)
Creatinine: 0.96 mg/dL (ref 0.44–1.00)
GFR, Est AFR Am: >60 mL/min (ref >60)
GFR, Estimated: 59 mL/min — ABNORMAL LOW (ref >60)
Glucose, Bld: 135 mg/dL — ABNORMAL HIGH (ref 70–99)
Potassium: 4.1 mmol/L (ref 3.5–5.1)
Sodium: 140 mmol/L (ref 135–145)
Total Bilirubin: 0.4 mg/dL (ref 0.3–1.2)
Total Protein: 6.8 g/dL (ref 6.5–8.1)

## 2019-02-25 ENCOUNTER — Ambulatory Visit: Payer: Medicare Other | Admitting: Physical Therapy

## 2019-02-25 ENCOUNTER — Encounter: Payer: Self-pay | Admitting: Physical Therapy

## 2019-02-25 ENCOUNTER — Encounter: Payer: Medicare Other | Admitting: Physical Therapy

## 2019-02-25 DIAGNOSIS — I890 Lymphedema, not elsewhere classified: Secondary | ICD-10-CM | POA: Diagnosis not present

## 2019-02-25 DIAGNOSIS — L599 Disorder of the skin and subcutaneous tissue related to radiation, unspecified: Secondary | ICD-10-CM | POA: Diagnosis not present

## 2019-02-25 NOTE — Therapy (Signed)
[place], [place], [address] Phone: [phone]   Fax:  [phone]  Physical Therapy Treatment  Patient Details  Name: Kendra Watts MRN: [id number] Date of Birth: May 24, 1946 Referring Provider (PT): [name]   Encounter Date: 02/25/2019  PT End of Session - 02/25/19 1351    Visit Number  6    Number of Visits  9    Date for PT Re-Evaluation  03/09/19    PT Start Time  5027    PT Stop Time  1345    PT Time Calculation (min)  42 min    Activity Tolerance  Patient tolerated treatment well    Behavior During Therapy  [facility] for tasks assessed/performed       Past Medical History:  Diagnosis Date  . Anemia   . 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 Breast cancer ([place]) 09/22/2015   Malignant  . Breast cancer of upper-outer quadrant of left female breast ([place]) 09/08/2015  . Cataract, immature   . CHF (congestive heart failure) ([place])    Acute CHF-06/2018  . COPD (chronic obstructive pulmonary disease) ([place])   . Heart murmur    states no known problems  . History of hyperthyroidism   . Hyperlipidemia   . Hypertension    states under control with meds., has been on med. x "long time"  . Hypokalemia    from last physical.   . Hypothyroidism   . Nonfunctioning kidney    left  . Personal history of radiation therapy    2017  . Pulmonary nodules    Bilateral  . Radiation 10/30/15-11/28/15   left breast 42.72 Gy, boosted to 10 Gy  . Renal artery stenosis ([place])   . Tobacco abuse   . Wears partial dentures    upper and lower    Past Surgical History:  Procedure Laterality Date  . ABDOMINAL ANGIOGRAM  02/18/2012   Procedure: ABDOMINAL ANGIOGRAM;  Surgeon: Lorretta Harp, MD;  Location: [facility] CATH LAB;  Service: Cardiovascular;;  . ABDOMINAL AORTAGRAM  07/04/2014  . ABDOMINAL HYSTERECTOMY  ~ 1977   partial  . APPENDECTOMY    . ARCH AORTOGRAM    . BREAST LUMPECTOMY Left 09/22/2015   Malignant   . CAROTID ANGIOGRAM N/A 02/18/2012   Procedure: CAROTID ANGIOGRAM;  Surgeon: Lorretta Harp, MD;  Location: [facility] CATH LAB;  Service: Cardiovascular;  Laterality: N/A;  . ENDARTERECTOMY  04/02/2012   Procedure: ENDARTERECTOMY CAROTID;  Surgeon: Serafina Mitchell, MD;  Location: [facility] OR;  Service: Vascular;  Laterality: Right;  . FUDUCIAL PLACEMENT Bilateral 09/02/2018   Procedure: Placement Of Fiducial to right upper lobe & left upper lobe lung;  Surgeon: [name], MD;  [facility];  Service: Thoracic;  Laterality: Bilateral;  . IR THORACENTESIS ASP PLEURAL SPACE W/IMG GUIDE  07/08/2018  . RADIOACTIVE SEED GUIDED PARTIAL MASTECTOMY WITH AXILLARY SENTINEL LYMPH NODE BIOPSY Left 09/22/2015   Procedure: INJECT BLUE DYE LEFT BREAST,RADIOACTIVE SEED GUIDED PARTIAL MASTECTOMY WITH AXILLARY SENTINEL LYMPH NODE BIOPSY;  Surgeon: Fanny Skates, MD;  Location: [place];  Service: General;  Laterality: Left;  . RENAL ANGIOGRAM Left 06/08/2010   renal artery stent -  5x12 Genesis on Aviator balloon stent (Dr. Adora Fridge)  . RENAL ANGIOGRAM Right 07/04/2014   Procedure: RENAL ANGIOGRAM;  Surgeon: Lorretta Harp, MD;  Location: [facility] CATH LAB;  Service: Cardiovascular;  Laterality: Right;  . RENAL ANGIOGRAM Right 08/22/2014  Procedure: RENAL ANGIOGRAM;  Surgeon: Lorretta Harp, MD;  Location: [facility] CATH LAB;  Service: Cardiovascular;  Laterality: Right;  . TONSILLECTOMY     as a child  . VIDEO BRONCHOSCOPY WITH ENDOBRONCHIAL NAVIGATION N/A 09/02/2018   Procedure: VIDEO BRONCHOSCOPY WITH ENDOBRONCHIAL NAVIGATION;  Surgeon: [name], MD;  [facility];  Service: Thoracic;  Laterality: N/A;    There were no vitals filed for this visit.  Subjective Assessment - 02/25/19 1305    Subjective  I think the sweling is going down. I can feel a difference. It is softer.    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  It is ER/PR positive and HER2 negative., pt underwent left lumpectomy with sentinel lymph node biopsy, pt completed radiation    Patient Stated Goals  to get the swelling under control    Currently in Pain?  Yes    Pain Score  3     Pain Location  Breast    Pain Orientation  Left                  Outpatient Re[facility]09/[facility]  Lymphedema Life Impact Scale Total Score  10.29 %           [facility] Adult PT Treatment/Exercise - 02/25/19 0001      Manual Therapy   Manual Lymphatic Drainage (MLD)  in supine short neck and diaphragmatic breathing, left inguinal nodes and establishemnt of axillo inguinal pathway, right axillary nodes and establishment of interaxillary pathway. left breast, moving fluid towards pathways then retracing all steps                  PT Long Term Goals - 02/09/19 1354      PT LONG TERM GOAL #1   Title  Pt will be independent with self MLD for long term management of left breast lymphedema    Time  4    Period  Weeks    Status  New    Target Date  03/09/19      PT LONG TERM GOAL #2   Title  Pt will obtain a better fitting bra that she can wear chip pack in to get targeted compression over area of swelling    Time  4    Period  Weeks    Status  New    Target Date  03/09/19      PT LONG TERM GOAL #3   Title  Pt will report a 50% reduction in swelling in left breast to allow for improved comfort    Time  4    Period  Weeks    Status  New    Target Date  03/09/19            Plan - 02/25/19 1352    Clinical Impression Statement  Continued with MLD today. Pt can tell her breast swelling is improving. Spent extra time on area of fibrosis just superior and slightly lateral to nipple. This did soften some by end of session.    Stability/Clinical Decision Making   Evolving/Moderate complexity    Rehab Potential  Good    Clinical Impairments Affecting Rehab Potential  hx of radiation    PT Frequency  2x / week    PT Duration  4 weeks  PT Treatment/Interventions  ADLs/Self Care Home Management;Therapeutic exercise;Orthotic Fit/Training;Manual techniques;Manual lymph drainage;Patient/family education;Scar mobilization;Passive range of motion;Taping    PT Next Visit Plan  continue  MLD to left breast and teach self care  see if pt has been back to [place], see if chip pack is helping    Consulted and Agree with Plan of Care  Patient       Patient will benefit from skilled therapeutic intervention in order to improve the following deficits and impairments:  Increased edema, Decreased scar mobility, Pain  Visit Diagnosis: 1. Lymphedema, not elsewhere classified        Problem List Patient Active Problem List   Diagnosis Date Noted  . Hemoptysis 09/14/2018  . Squamous cell lung cancer, left ([place]) 09/10/2018  . Adenocarcinoma, lung, right ([place]) 09/10/2018  . Multiple lung nodules 07/20/2018  . Chronic obstructive pulmonary disease ([place]) 07/20/2018  . Acute CHF (congestive heart failure) ([place]) 07/06/2018  . Acute respiratory failure with hypoxia ([place]) 07/06/2018  . Hyponatremia 07/06/2018  . Anemia 07/06/2018  . Breast cancer of upper-outer quadrant of left female breast ([place]) 09/08/2015  . Renal artery stenosis ([place]) 08/23/2014  . Right upper extremity numbness 07/01/2013  . Atherosclerotic renal artery stenosis, bilateral ([place]) 05/26/2013  . Peripheral arterial disease ([place]) 05/26/2013  . Essential hypertension 05/26/2013  . Hyperlipidemia 05/26/2013  . Tobacco abuse 05/26/2013  . Carotid artery stenosis 03/09/2012    Allyson Sabal [facility] 02/25/2019, 1:53 PM  [place], [place], [address] Phone: [phone]   Fax:  [phone]  Name: Kendra Watts MRN: [id number] Date of Birth: Sep 23, 1945  Manus Gunning, PT 02/25/19 1:53 PM

## 2019-03-02 ENCOUNTER — Ambulatory Visit (HOSPITAL_COMMUNITY): Admission: RE | Admit: 2019-03-02 | Payer: Medicare Other | Source: Ambulatory Visit

## 2019-03-02 ENCOUNTER — Ambulatory Visit (HOSPITAL_COMMUNITY)
Admission: RE | Admit: 2019-03-02 | Discharge: 2019-03-02 | Disposition: A | Discharge status: Home / Self Care | Payer: Medicare Other | Source: Ambulatory Visit | Attending: Hematology and Oncology | Admitting: Hematology and Oncology

## 2019-03-02 ENCOUNTER — Other Ambulatory Visit: Payer: Medicare Other

## 2019-03-02 ENCOUNTER — Encounter (HOSPITAL_COMMUNITY): Payer: Self-pay | Admitting: Radiology

## 2019-03-02 ENCOUNTER — Other Ambulatory Visit: Payer: Self-pay

## 2019-03-02 DIAGNOSIS — C3490 Malignant neoplasm of unspecified part of unspecified bronchus or lung: Secondary | ICD-10-CM | POA: Diagnosis not present

## 2019-03-02 DIAGNOSIS — R918 Other nonspecific abnormal finding of lung field: Secondary | ICD-10-CM | POA: Diagnosis not present

## 2019-03-02 MED ORDER — SODIUM CHLORIDE (PF) 0.9 % IJ SOLN
INTRAMUSCULAR | Status: AC
  Filled 2019-03-02: qty 50

## 2019-03-02 MED ORDER — IOHEXOL 300 MG/ML  SOLN
75.0000 mL | Freq: Once | INTRAMUSCULAR | Status: AC | PRN
  Administered 2019-03-02: 75 mL via INTRAVENOUS

## 2019-03-03 ENCOUNTER — Encounter: Payer: Medicare Other | Admitting: Physical Therapy

## 2019-03-05 ENCOUNTER — Ambulatory Visit: Payer: Medicare Other | Admitting: Physical Therapy

## 2019-03-05 ENCOUNTER — Other Ambulatory Visit: Payer: Self-pay

## 2019-03-05 ENCOUNTER — Encounter: Payer: Self-pay | Admitting: Physical Therapy

## 2019-03-05 DIAGNOSIS — I890 Lymphedema, not elsewhere classified: Secondary | ICD-10-CM

## 2019-03-05 DIAGNOSIS — L599 Disorder of the skin and subcutaneous tissue related to radiation, unspecified: Secondary | ICD-10-CM

## 2019-03-05 NOTE — Therapy (Signed)
[place], [place], [address] Phone: [phone]   Fax:  [phone]  Physical Therapy Treatment  Patient Details  Name: Kendra Watts MRN: [id number] Date of Birth: 19-Jan-1946 Referring Provider (PT): [name]   Encounter Date: 03/05/2019  PT End of Session - 03/05/19 1213    Visit Number  7    Number of Visits  9    Date for PT Re-Evaluation  03/09/19    PT Start Time  1127    PT Stop Time  1213    PT Time Calculation (min)  46 min    Activity Tolerance  Patient tolerated treatment well    Behavior During Therapy  [facility] for tasks assessed/performed       Past Medical History:  Diagnosis Date  . Anemia   . 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 Breast cancer ([place]) 09/22/2015   Malignant  . Breast cancer of upper-outer quadrant of left female breast ([place]) 09/08/2015  . Cataract, immature   . CHF (congestive heart failure) ([place])    Acute CHF-06/2018  . COPD (chronic obstructive pulmonary disease) ([place])   . Heart murmur    states no known problems  . History of hyperthyroidism   . Hyperlipidemia   . Hypertension    states under control with meds., has been on med. x "long time"  . Hypokalemia    from last physical.   . Hypothyroidism   . Nonfunctioning kidney    left  . Personal history of radiation therapy    2017  . Pulmonary nodules    Bilateral  . Radiation 10/30/15-11/28/15   left breast 42.72 Gy, boosted to 10 Gy  . Renal artery stenosis ([place])   . Tobacco abuse   . Wears partial dentures    upper and lower    Past Surgical History:  Procedure Laterality Date  . ABDOMINAL ANGIOGRAM  02/18/2012   Procedure: ABDOMINAL ANGIOGRAM;  Surgeon: Lorretta Harp, MD;  Location: [facility] CATH LAB;  Service: Cardiovascular;;  . ABDOMINAL AORTAGRAM  07/04/2014  . ABDOMINAL HYSTERECTOMY  ~ 1977   partial  . APPENDECTOMY    . ARCH AORTOGRAM    . BREAST LUMPECTOMY Left 09/22/2015   Malignant  . CAROTID ANGIOGRAM N/A 02/18/2012   Procedure: CAROTID ANGIOGRAM;  Surgeon: Lorretta Harp, MD;  Location: [facility] CATH LAB;  Service: Cardiovascular;  Laterality: N/A;  . ENDARTERECTOMY  04/02/2012   Procedure: ENDARTERECTOMY CAROTID;  Surgeon: Serafina Mitchell, MD;  Location[facility] OR;  Service: Vascular;  Laterality: Right;  . FUDUCIAL PLACEMENT Bilateral 09/02/2018   Procedure: Placement Of Fiducial to right upper lobe & left upper lobe lung;  Surgeon: [name], MD;  [facility];  Service: Thoracic;  Laterality: Bilateral;  . IR THORACENTESIS ASP PLEURAL SPACE W/IMG GUIDE  07/08/2018  . RADIOACTIVE SEED GUIDED PARTIAL MASTECTOMY WITH AXILLARY SENTINEL LYMPH NODE BIOPSY Left 09/22/2015   Procedure: INJECT BLUE DYE LEFT BREAST,RADIOACTIVE SEED GUIDED PARTIAL MASTECTOMY WITH AXILLARY SENTINEL LYMPH NODE BIOPSY;  Surgeon: Fanny Skates, MD;  Location: [place];  Service: General;  Laterality: Left;  . RENAL ANGIOGRAM Left 06/08/2010   renal artery stent -  5x12 Genesis on Aviator balloon stent (Dr. Adora Fridge)  . RENAL ANGIOGRAM Right 07/04/2014   Procedure: RENAL ANGIOGRAM;  Surgeon: Lorretta Harp, MD;  Location: [facility] CATH LAB;  Service: Cardiovascular;  Laterality: Right;  . RENAL ANGIOGRAM Right 08/22/2014   Procedure:  RENAL ANGIOGRAM;  Surgeon: Lorretta Harp, MD;  Location: [facility] CATH LAB;  Service: Cardiovascular;  Laterality: Right;  . TONSILLECTOMY     as a child  . VIDEO BRONCHOSCOPY WITH ENDOBRONCHIAL NAVIGATION N/A 09/02/2018   Procedure: VIDEO BRONCHOSCOPY WITH ENDOBRONCHIAL NAVIGATION;  Surgeon: [name], MD;  [facility];  Service: Thoracic;  Laterality: N/A;    There were no vitals filed for this visit.  Subjective Assessment - 03/05/19 1130    Subjective  My swelling is getting better. My mind has been elsewhere because of my CT scan.    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  It is ER/PR positive and HER2 negative., pt underwent left lumpectomy with sentinel lymph node biopsy, pt completed radiation    Patient Stated Goals  to get the swelling under control    Currently in Pain?  No/denies    Pain Score  0-No pain                  Outpatient Rehab from 02/09/2019 in [place]  Lymphedema Life Impact Scale Total Score  10.29 %           [facility] Adult PT Treatment/Exercise - 03/05/19 0001      Manual Therapy   Manual Lymphatic Drainage (MLD)  in supine short neck and diaphragmatic breathing, left inguinal nodes and establishemnt of axillo inguinal pathway, right axillary nodes and establishment of interaxillary pathway. left breast, moving fluid towards pathways then retracing all steps                  PT Long Term Goals - 03/05/19 1210      PT LONG TERM GOAL #1   Title  Pt will be independent with self MLD for long term management of left breast lymphedema    Baseline  03/05/19- pt is independent with this    Time  4    Period  Weeks    Status  Achieved      PT LONG TERM GOAL #2   Title  Pt will obtain a better fitting bra that she can wear chip pack in to get targeted compression over area of swelling    Baseline  03/05/19- pt has a bra that accomodates the chip pack that she wears at home    Time  4    Period  Weeks    Status  Achieved      PT LONG TERM GOAL #3   Title  Pt will report a 50% reduction in swelling in left breast to allow for improved comfort    Baseline  03/05/19- 75% reduction in swelling    Time  4    Period  Weeks    Status  Achieved            Plan - 03/05/19 1214    Clinical Impression Statement  Pt has now met all goals for therapy. Her breast fibrosis has softening and continues to respond to MLD. Pt is now independent in MLD and  is able to manage her swelling at home.    Rehab Potential  Good    Clinical Impairments Affecting Rehab Potential  hx of radiation    PT Frequency  2x / week    PT Duration  4 weeks    PT  Treatment/Interventions  ADLs/Self Care Home Management;Therapeutic exercise;Orthotic Fit/Training;Manual techniques;Manual lymph drainage;Patient/family education;Scar mobilization;Passive range of motion;Taping    PT Next Visit Plan  dc this visit    Consulted and Agree with Plan of Care  Patient       Patient will benefit from skilled therapeutic intervention in order to improve the following deficits and impairments:  Increased edema, Decreased scar mobility, Pain  Visit Diagnosis: 1. Lymphedema, not elsewhere classified   2. Disorder of the skin and subcutaneous tissue related to radiation, unspecified        Problem List Patient Active Problem List   Diagnosis Date Noted  . Hemoptysis 09/14/2018  . Squamous cell lung cancer, left ([place]) 09/10/2018  . Adenocarcinoma, lung, right ([place]) 09/10/2018  . Multiple lung nodules 07/20/2018  . Chronic obstructive pulmonary disease ([place]) 07/20/2018  . Acute CHF (congestive heart failure) ([place]) 07/06/2018  . Acute respiratory failure with hypoxia ([place]) 07/06/2018  . Hyponatremia 07/06/2018  . Anemia 07/06/2018  . Breast cancer of upper-outer quadrant of left female breast ([place]) 09/08/2015  . Renal artery stenosis ([place]) 08/23/2014  . Right upper extremity numbness 07/01/2013  . Atherosclerotic renal artery stenosis, bilateral ([place]) 05/26/2013  . Peripheral arterial disease ([place]) 05/26/2013  . Essential hypertension 05/26/2013  . Hyperlipidemia 05/26/2013  . Tobacco abuse 05/26/2013  . Carotid artery stenosis 03/09/2012    Allyson Sabal [facility] 03/05/2019, 12:15 PM  [place], [place], [address] Phone: [phone]   Fax:  [phone]  Name: Kendra Watts MRN:  [id number] Date of Birth: [birth year]  Manus Gunning, PT 03/05/19 12:16 PM  PHYSICAL THERAPY DISCHARGE SUMMARY  Visits from Start of Care: 7  Current functional level related to goals / functional outcomes: All goals met   Remaining deficits: None   Education / Equipment: Self MLD, compression  Plan: Patient agrees to discharge.  Patient goals were met. Patient is being discharged due to meeting the stated rehab goals.  ?????    Allyson Sabal [place], [name] 03/05/19 12:16 PM

## 2019-03-09 ENCOUNTER — Encounter: Payer: Medicare Other | Admitting: Physical Therapy

## 2019-03-11 ENCOUNTER — Other Ambulatory Visit: Payer: Self-pay

## 2019-03-11 ENCOUNTER — Ambulatory Visit
Admission: RE | Admit: 2019-03-11 | Discharge: 2019-03-11 | Disposition: A | Discharge status: Home / Self Care | Payer: Medicare Other | Source: Ambulatory Visit | Attending: Radiation Oncology | Admitting: Radiation Oncology

## 2019-03-11 ENCOUNTER — Encounter: Payer: Self-pay | Admitting: Radiation Oncology

## 2019-03-11 VITALS — BP 122/80 | HR 92 | Temp 99.1°F | Resp 18 | Ht 60.0 in | Wt 95.1 lb

## 2019-03-11 DIAGNOSIS — Z08 Encounter for follow-up examination after completed treatment for malignant neoplasm: Secondary | ICD-10-CM | POA: Diagnosis not present

## 2019-03-11 DIAGNOSIS — I700 Atherosclerosis of aorta: Secondary | ICD-10-CM | POA: Insufficient documentation

## 2019-03-11 DIAGNOSIS — R918 Other nonspecific abnormal finding of lung field: Secondary | ICD-10-CM | POA: Insufficient documentation

## 2019-03-11 DIAGNOSIS — C3412 Malignant neoplasm of upper lobe, left bronchus or lung: Secondary | ICD-10-CM | POA: Diagnosis not present

## 2019-03-11 DIAGNOSIS — Z853 Personal history of malignant neoplasm of breast: Secondary | ICD-10-CM | POA: Diagnosis not present

## 2019-03-11 DIAGNOSIS — Z85118 Personal history of other malignant neoplasm of bronchus and lung: Secondary | ICD-10-CM | POA: Diagnosis not present

## 2019-03-11 DIAGNOSIS — Z923 Personal history of irradiation: Secondary | ICD-10-CM | POA: Insufficient documentation

## 2019-03-11 DIAGNOSIS — Z79899 Other long term (current) drug therapy: Secondary | ICD-10-CM | POA: Insufficient documentation

## 2019-03-11 DIAGNOSIS — I2510 Atherosclerotic heart disease of native coronary artery without angina pectoris: Secondary | ICD-10-CM | POA: Diagnosis not present

## 2019-03-11 DIAGNOSIS — C3492 Malignant neoplasm of unspecified part of left bronchus or lung: Secondary | ICD-10-CM

## 2019-03-11 DIAGNOSIS — Z7982 Long term (current) use of aspirin: Secondary | ICD-10-CM | POA: Diagnosis not present

## 2019-03-11 DIAGNOSIS — C3411 Malignant neoplasm of upper lobe, right bronchus or lung: Secondary | ICD-10-CM | POA: Diagnosis not present

## 2019-03-11 NOTE — Progress Notes (Signed)
Radiation Oncology         [phone] ________________________________  Name: Kendra Watts MRN: [id number]  Date: 03/11/2019  DOB: 24-Nov-1945  Follow-Up Visit Note  CC: Merrilee Seashore, MD  Nicholas Lose, MD    ICD-10-CM   1. Squamous cell lung cancer, left (HCC)  C34.92 CT Chest Wo Contrast    Diagnosis:    1. Synchronous bilateral stage 1 lung cancers  (a) Squamous cell carcinoma in the left upper lobe stage IA3 (cT1c, N0, M0)  (b) Adenocarcinoma in the right upper lobe stage IA2 (cT1b, N0, M0)   2. Stage IA (pT1c, pN0) left breast invasive ductal carcinoma, ER+ Ysidro Evert /Her2-  Interval Since Last Radiation:  4 months  1. 4/7, 4/9, 11/02/2018: Left and Right Lung targets were each treated to 54 Gy in 3 fractions 2. 10/30/15 - 11/28/15: Left Breast with Boost for a total of 52.72 Gy in 21 fractions  Narrative:  The patient returns today for routine follow-up.   Since her last visit, she underwent chest CT on 03/02/2019. This showed: a potential new pulmonary nodule in the right lower lobe; angular nodule in right upper lobe is stable; angular nodule in the left upper lobe has contracted in the interval.  On review of systems, she reports frequent productive cough with clear/yellow sputum. She also reports shortness of breath with exertion at times. She denies pain, hemoptysis, and difficulty swallowing.  ALLERGIES:  has No Known Allergies.  Meds: Current Outpatient Medications  Medication Sig Dispense Refill  . albuterol (VENTOLIN HFA) 108 (90 Base) MCG/ACT inhaler Inhale 2 puffs into the lungs every 6 (six) hours as needed for wheezing or shortness of breath. 8 g 3  . aspirin 81 MG tablet Take 81 mg by mouth daily.    . Calcium-Magnesium-Vitamin D (CALCIUM 1200+D3 PO) Take 1 tablet by mouth daily.    Marland Kitchen doxazosin (CARDURA) 8 MG tablet Take 8 mg by mouth daily.     . furosemide (LASIX) 20 MG tablet Take 1 tablet (20 mg total) by mouth daily. 30 tablet 0  . levothyroxine  (SYNTHROID, LEVOTHROID) 88 MCG tablet Take 88 mcg by mouth daily before breakfast.    . Multiple Vitamins-Minerals (MULTIVITAMIN WITH MINERALS) tablet Take 1 tablet by mouth daily.     . Naphazoline HCl (CLEAR EYES OP) Place 2 drops into both eyes daily.    . pantoprazole (PROTONIX) 40 MG tablet Take 1 tablet (40 mg total) by mouth daily. 30 tablet 0  . rosuvastatin (CRESTOR) 10 MG tablet Take 10 mg by mouth daily.     . tamoxifen (NOLVADEX) 20 MG tablet TAKE 1 TABLET BY MOUTH  DAILY 90 tablet 3  . umeclidinium-vilanterol (ANORO ELLIPTA) 62.5-25 MCG/INH AEPB Inhale 1 puff into the lungs daily.     No current facility-administered medications for this encounter.     Physical Findings: The patient is in no acute distress. Patient is alert and oriented.  height is 5' (1.524 m) and weight is 95 lb 2 oz (43.1 kg). Her temporal temperature is 99.1 F (37.3 C). Her blood pressure is 122/80 and her pulse is 92. Her respiration is 18 and oxygen saturation is 97%. .  No significant changes. Lungs are clear to auscultation bilaterally. Heart has regular rate and rhythm. No palpable cervical, supraclavicular, or axillary adenopathy. Abdomen soft, non-tender, normal bowel sounds.   Lab Findings: Lab Results  Component Value Date   WBC 7.4 08/26/2018   HGB 12.6 08/26/2018   HCT 38.8 08/26/2018  MCV 88.2 08/26/2018   PLT 222 08/26/2018    Radiographic Findings: Ct Chest W Contrast  Result Date: 03/02/2019 CLINICAL DATA:  LT breast cancer diagnosed 2017, LT lumpectomy with LN dissection, completed XRT; [place] lung cancer diagnosed 2/20; completed XRT 4/20; no chemo; chronic SOB/ OMNI 300/69m/^75mL OMNIPAQUE IOHEXOL 300 MG/ML SOLNLung cancer, non-small cell, staging restaging lung cancer after xrt EXAM: CT CHEST WITH CONTRAST TECHNIQUE: Multidetector CT imaging of the chest was performed during intravenous contrast administration. 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 Coronary artery calcification and aortic atherosclerotic calcification. Mediastinum/Nodes: No axillary or supraclavicular adenopathy. No mediastinal hilar adenopathy. No pericardial effusion. Lungs/Pleura: Linear nodular thickening in the RIGHT upper lobe measures 9 mm x 8 mm in axial dimension (image 28/5) which is unchanged from 10 mm x 10 mm on comparison exam. Centrilobular emphysema the upper lobes. Within the RIGHT lower lobe 7 mm nodule (image 103/5) is not identified on comparison exam. There was atelectasis and effusion on comparison exam which may have obscured this nodule. Angular nodular thickening in the LEFT upper lobe demonstrates some interval contraction measuring 1.5 by 0.8 cm compared to 1.9 by 1.2 cm. Upper Abdomen: Limited view of the liver, kidneys, pancreas are unremarkable. Normal adrenal glands. Musculoskeletal: No aggressive osseous lesion. IMPRESSION: 1. Most concerning finding is a potential new pulmonary nodule in the RIGHT lower lobe. Recommend short-term CT follow-up. 2. Angular nodule in the RIGHT upper lobe is not increased in volume. 3. Angular nodular thickening in the LEFT upper lobe has contracted in the interval and therefore favored benign. 4. Coronary artery calcification and Aortic Atherosclerosis (ICD10-I70.0). Electronically Signed   By: StSuzy Bouchard.D.   On: 03/02/2019 12:23    Impression:  Synchronous bilateral stage 1 lung cancers Clinically stable, no evidence of recurrence on clinical exam. Recent CT scan showed stability of the two lesions treated with SBRT. Patient, however, has a new pulmonary nodule in the right lower lobe, which measures 7 mm. Close interval CT scans were recommended for further evaluation of this lesion.  Plan:  Patient will be scheduled for CT scan of the chest in 3 months and then follow up soon  after CT scan.  ____________________________________ JaGery PrayMD   This document serves as a record of services personally performed by JaGery PrayMD. It was created on his behalf by KaWilburn Mylara trained medical scribe. The creation of this record is based on the scribe's personal observations and the provider's statements to them. This document has been checked and approved by the attending provider.

## 2019-03-11 NOTE — Patient Instructions (Signed)
Coronavirus (COVID-19) Are you at risk?  Are you at risk for the Coronavirus (COVID-19)?  To be considered HIGH RISK for Coronavirus (COVID-19), you have to meet the following criteria:  . Traveled to [place], [place], [place], [place] or [place]; or in the [place] to [place], [place], [place], or [place]; and have fever, cough, and shortness of breath within the last 2 weeks of travel OR . Been in close contact with a person diagnosed with COVID-19 within the last 2 weeks and have fever, cough, and shortness of breath . IF YOU DO NOT MEET THESE CRITERIA, YOU ARE CONSIDERED LOW RISK FOR COVID-19.  What to do if you are HIGH RISK for COVID-19?  . If you are having a medical emergency, call 911. . Seek medical care right away. Before you go to a doctor's office, urgent care or emergency department, call ahead and tell them about your recent travel, contact with someone diagnosed with COVID-19, and your symptoms. You should receive instructions from your physician's office regarding next steps of care.  . When you arrive at healthcare provider, tell the healthcare staff immediately you have returned from visiting [place], [place], [place], [place] or [place]; or traveled in the [place] to [place], [place], [place], or [place]; in the last two weeks or you have been in close contact with a person diagnosed with COVID-19 in the last 2 weeks.   . Tell the health care staff about your symptoms: fever, cough and shortness of breath. . After you have been seen by a medical provider, you will be either: o Tested for (COVID-19) and discharged home on quarantine except to seek medical care if symptoms worsen, and asked to  - Stay home and avoid contact with others until you get your results (4-5 days)  - Avoid travel on public transportation if possible (such as bus, train, or airplane) or o Sent to the Emergency Department by EMS for evaluation, COVID-19 testing, and possible  admission depending on your condition and test results.  What to do if you are LOW RISK for COVID-19?  Reduce your risk of any infection by using the same precautions used for avoiding the common cold or flu:  . Wash your hands often with soap and warm water for at least 20 seconds.  If soap and water are not readily available, use an alcohol-based hand sanitizer with at least 60% alcohol.  . If coughing or sneezing, cover your mouth and nose by coughing or sneezing into the elbow areas of your shirt or coat, into a tissue or into your sleeve (not your hands). . Avoid shaking hands with others and consider head nods or verbal greetings only. . Avoid touching your eyes, nose, or mouth with unwashed hands.  . Avoid close contact with people who are sick. . Avoid places or events with large numbers of people in one location, like concerts or sporting events. . Carefully consider travel plans you have or are making. . If you are planning any travel outside or inside the [place], visit the CDC's Travelers' Health webpage for the latest health notices. . If you have some symptoms but not all symptoms, continue to monitor at home and seek medical attention if your symptoms worsen. . If you are having a medical emergency, call 911.   ADDITIONAL HEALTHCARE OPTIONS FOR PATIENTS  [facility] / e-Visit: [email or web address]         [facility]: [phone]  [facility]: [phone]                   [facility]: [phone]   

## 2019-03-11 NOTE — Progress Notes (Addendum)
Pt presents today for f/u with Dr. Sondra Come. Pt denies c/o pain. Pt reports frequent cough with clear/yellow sputum. Pt denies hemoptysis. Pt denies difficulty swallowing. Pt reports SOB with exertion at times.   BP 122/80 (BP Location: Right Arm, Patient Position: Sitting)   Pulse 92   Temp 99.1 F (37.3 C) (Temporal)   Resp 18   Ht 5' (1.524 m)   Wt 95 lb 2 oz (43.1 kg)   SpO2 97%   BMI 18.58 kg/m   Wt Readings from Last 3 Encounters:  03/11/19 95 lb 2 oz (43.1 kg)  02/02/19 95 lb (43.1 kg)  09/28/18 97 lb 3.2 oz (44.1 kg)   Loma Sousa, RN BSN    Pt reports occasional pain in back, just below RIGHT shoulder blade. Loma Sousa, RN BSN

## 2019-03-12 ENCOUNTER — Encounter: Payer: Medicare Other | Admitting: Physical Therapy

## 2019-03-15 ENCOUNTER — Telehealth: Payer: Self-pay | Admitting: Cardiovascular Disease

## 2019-03-15 NOTE — Telephone Encounter (Signed)
OK with me for her to have her dopplers prior to her a[[t with me in Oct

## 2019-03-15 NOTE — Telephone Encounter (Signed)
New Message   Patient feels that she is having problems with circulation. She states that her legs and feet feel cold. Patient also advised that another provider took her off of Plavix as well. Please call to discuss.

## 2019-03-15 NOTE — Telephone Encounter (Signed)
Pt called to report that over the past several weeks she has noticed that her feet bilaterally has been very cold to touch.. she denies change in color, edema, no pain except "tingling" at night when she is laying in bed.   She has an appt with Dr. Gwenlyn Found 04/27/19 and wants to know if she can heave her yearly Vasc US due 04/2019 done sooner before her appt. She has also been off of her Plavix per Dr. Lamonte Sakai for several months. She has had been dx with Lung CA and had a lot of problems with nose bleeds.   Will forward to Dr. Gwenlyn Found.

## 2019-03-19 DIAGNOSIS — Z85118 Personal history of other malignant neoplasm of bronchus and lung: Secondary | ICD-10-CM | POA: Diagnosis not present

## 2019-03-19 DIAGNOSIS — R0981 Nasal congestion: Secondary | ICD-10-CM | POA: Diagnosis not present

## 2019-03-30 DIAGNOSIS — Z9981 Dependence on supplemental oxygen: Secondary | ICD-10-CM | POA: Diagnosis not present

## 2019-03-30 DIAGNOSIS — J310 Chronic rhinitis: Secondary | ICD-10-CM | POA: Diagnosis not present

## 2019-04-22 ENCOUNTER — Encounter (HOSPITAL_COMMUNITY): Payer: Medicare Other

## 2019-04-27 ENCOUNTER — Other Ambulatory Visit: Payer: Self-pay

## 2019-04-27 ENCOUNTER — Ambulatory Visit (INDEPENDENT_AMBULATORY_CARE_PROVIDER_SITE_OTHER): Payer: Medicare Other | Admitting: Cardiovascular Disease

## 2019-04-27 ENCOUNTER — Encounter: Payer: Self-pay | Admitting: Cardiovascular Disease

## 2019-04-27 DIAGNOSIS — I10 Essential (primary) hypertension: Secondary | ICD-10-CM

## 2019-04-27 DIAGNOSIS — I6523 Occlusion and stenosis of bilateral carotid arteries: Secondary | ICD-10-CM

## 2019-04-27 DIAGNOSIS — E782 Mixed hyperlipidemia: Secondary | ICD-10-CM | POA: Diagnosis not present

## 2019-04-27 DIAGNOSIS — I701 Atherosclerosis of renal artery: Secondary | ICD-10-CM

## 2019-04-27 DIAGNOSIS — Z720 Tobacco use: Secondary | ICD-10-CM | POA: Diagnosis not present

## 2019-04-27 DIAGNOSIS — I739 Peripheral vascular disease, unspecified: Secondary | ICD-10-CM

## 2019-04-27 NOTE — Assessment & Plan Note (Signed)
History of hyperlipidemia on statin therapy with lipid profile performed 04/23/2017 revealing total cholesterol 148, LDL 45 and HDL of 93.

## 2019-04-27 NOTE — Assessment & Plan Note (Signed)
History of peripheral arterial disease with known bilateral iliac stenoses and some claudication.  We will recheck lower extremity arterial Doppler studies.

## 2019-04-27 NOTE — Assessment & Plan Note (Signed)
History of carotid artery disease status post right carotid carotid endarterectomy performed by Dr. Trula Slade 04/02/2012.  Her last carotid Dopplers performed 04/24/2017 revealed her endarterectomy site to be widely patent.  She does have loud bilateral carotid bruits.  Will recheck carotid Doppler studies.

## 2019-04-27 NOTE — Progress Notes (Signed)
04/27/2019 Kendra Watts [facility]   Dec 10, 1945  [id number]  Primary Physician Merrilee Seashore, MD Primary Cardiologist: Lorretta Harp MD Garret Reddish, [place], [place]  HPI:  Kendra Watts is a 73 y.o.    hin-appearing divorced Caucasian female with no children who I last saw on  03/31/2018... She is retired from doing [employer] at [employer]. She has a history of continued tobacco abuse at 1 pack per day, hypertension and hyperlipidemia with a strong family history for heart disease. Her mother had her first MI at age 51 and died at age 67. I have angiogrammed her revealing a tight right internal carotid artery stenosis, as well as a right subclavian artery stenosis. 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 I stented her left renal artery and she has moderate bilateral iliac disease but really denies claudication.She did had elective right carotid endarterectomy performed by Dr. Trula Slade April 02, 2012.since I saw her a year ago she's been remarkably stable. Carotid Dopplers performed /23/15 revealed a widely patent right carotid endarterectomy site and minimal left carotid disease. Renal Dopplers social progression of disease in the right renal artery. Her left kidney continues to diminish in size despite a patent stent. Recent renal Dopplers performed in April show progression of disease on the right as well.Lower extremity arterial Dopplers to show progression of disease in the iliac arteries as she denies claudication.she denies chest pain or shortness of breath. 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 Follow-up renal Doppler studies performed on 05/19/14 revealed progression of disease in her right renal artery with a renal aortic ratio that went from 6.34-7.36. I'm concerned that she has progression of disease in her only functioning kidney. I performed angiography on  her 07/04/14 via the left brachial approach revealing occluded left renal artery stent and 90% calcified ostial downgoing right renal artery. I decided not to intervene at that time. I'm slightly concerned about the technical difficulties with an ostial calcified lesion with regard to dissection and/or rupture. Her only other option would be renal bypass surgery. I performed percutaneous revascularization on her right renal artery ostia on 08/22/14 via the left brachial approach reducing a 95% calcified ostial right renal artery stenosis and a solitary kidney to 0% residual with an ICast covered Stent. Her most recent renal Doppler study performed 06/07/15 revealed this to be widely patent. Since I saw her a year ago she's remained asymptomatic. She denies chest pain, shortness of breath. She does have some pain in her legs which sound like restless leg syndrome. She had ductal breast carcinoma with lumpectomy performed by Dr. [name] 09/22/15 with subsequent radiation therapy. She did not require chemotherapy. Since I saw her last she's noticed some discomfort in her left wrist when she walks as well as tingling. 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  Since I saw her in the office 1 year ago she was unfortunately diagnosed with lung cancer and has had radiation therapy.  She was admitted to the hospital 07/06/2018 with respiratory failure probably related to COPD flare and diastolic dysfunction.  She was diuresed.  She is also found to be significantly anemic and was transfused.  Since that time she does complain of chronic shortness of breath as well as lower extremity claudication.  She unfortunately continues to smoke.   Current Meds  Medication Sig   albuterol (VENTOLIN HFA) 108 (90 Base) MCG/ACT inhaler Inhale 2 puffs into the lungs every 6 (six) hours as needed for wheezing or shortness of breath.   aspirin 81 MG tablet Take 81 mg by mouth daily.    Calcium-Magnesium-Vitamin D (CALCIUM 1200+D3 PO) Take 1 tablet by mouth daily.   doxazosin (CARDURA) 8 MG tablet Take 8 mg by mouth daily.    furosemide (LASIX) 20 MG tablet Take 1 tablet (20 mg total) by mouth daily.   levothyroxine (SYNTHROID, LEVOTHROID) 88 MCG tablet Take 88 mcg by mouth daily before breakfast.   Multiple Vitamins-Minerals (MULTIVITAMIN WITH MINERALS) tablet Take 1 tablet by mouth daily.    Naphazoline HCl (CLEAR EYES OP) Place 2 drops into both eyes daily.   pantoprazole (PROTONIX) 40 MG tablet Take 1 tablet (40 mg total) by mouth daily.   rosuvastatin (CRESTOR) 10 MG tablet Take 10 mg by mouth daily.    tamoxifen (NOLVADEX) 20 MG tablet TAKE 1 TABLET BY MOUTH  DAILY   umeclidinium-vilanterol (ANORO ELLIPTA) 62.5-25 MCG/INH AEPB Inhale 1 puff into the lungs daily.     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[place] [employer] resource strain: Not on file   Food insecurity    Worry: Not on file    Inability: Not on file   Transportation needs    Medical: Not on file    Non-medical: Not on file  Tobacco Use   Smoking status: Current Every Day Smoker    Packs/day: 1.50    Years: 40.00    Pack years: 60.00    Types: Cigarettes   Smokeless tobacco: Never Used   Tobacco comment: as of 09/14/2018, less than 1 pack/day  Substance and Sexual Activity   Alcohol use: Yes    Comment: occasionally   Drug use: No   Sexual activity: Never  Lifestyle   Physical activity    Days per week: Not on file    Minutes per session: Not on file   Stress: Not on file  Relationships   Social connections    Talks on phone: Not on file    Gets together: Not on file    Attends religious  service: Not on file    Active member of club or organization: Not on file    Attends meetings of clubs or organizations: Not on file    Relationship status: Not on file   Intimate partner violence    Fear of current or ex partner: Not on file    Emotionally abused: Not on file    Physically abused: Not on file    Forced sexual activity: Not on file  Other Topics Concern   Not on file  Social History Narrative   Not on file     Review of Systems: General: negative for chills, fever, night sweats or weight changes.  Cardiovascular: negative for chest pain, dyspnea on exertion, edema, orthopnea, palpitations, paroxysmal nocturnal dyspnea or shortness of breath Dermatological: negative for rash Respiratory: negative for cough or wheezing Urologic: negative for hematuria Abdominal: negative for nausea, vomiting, diarrhea, bright red blood per rectum, melena, or hematemesis Neurologic: negative for visual changes, syncope, or  dizziness All other systems reviewed and are otherwise negative except as noted above.    Blood pressure (!) 163/99, pulse (!) 104, height 5' (1.524 m), weight 96 lb 9.6 oz (43.8 kg), SpO2 91 %.  General appearance: alert and no distress Neck: no adenopathy, no JVD, supple, symmetrical, trachea midline, thyroid not enlarged, symmetric, no tenderness/mass/nodules and Bilateral carotid bruits Lungs: clear to auscultation bilaterally Heart: regular rate and rhythm, S1, S2 normal, no murmur, click, rub or gallop Extremities: extremities normal, atraumatic, no cyanosis or edema Pulses: 2+ and symmetric Skin: Skin color, texture, turgor normal. No rashes or lesions Neurologic: Alert and oriented X 3, normal strength and tone. Normal symmetric reflexes. Normal coordination and gait  EKG sinus tachycardia at 104 with low limb voltage and septal Q waves.  I personally reviewed this EKG.  ASSESSMENT AND PLAN:   Carotid artery stenosis History of carotid artery  disease status post right carotid carotid endarterectomy performed by Dr. Trula Slade 04/02/2012.  Her last carotid Dopplers performed 04/24/2017 revealed her endarterectomy site to be widely patent.  She does have loud bilateral carotid bruits.  Will recheck carotid Doppler studies.  Atherosclerotic renal artery stenosis, bilateral History of an occluded left renal artery with an atrophic left kidney status post right renal artery PTA and stenting using a covered stent via the left brachial approach by myself 08/22/2014 with renal Dopplers performed 09/17/2018 revealing this to be widely patent.  Peripheral arterial disease (HCC) History of peripheral arterial disease with known bilateral iliac stenoses and some claudication.  We will recheck lower extremity arterial Doppler studies.  Essential hypertension History of essential hypertension blood pressure measured today 163/99.  She is on no antihypertensive medications.  Hyperlipidemia History of hyperlipidemia on statin therapy with lipid profile performed 04/23/2017 revealing total cholesterol 148, LDL 45 and HDL of 93.  Tobacco abuse History of ongoing tobacco abuse despite recently being diagnosed with lung cancer and having radiation therapy recalcitrant to risk factor modification.      Lorretta Harp MD FACP,FACC,FAHA, [facility] 04/27/2019 12:05 PM

## 2019-04-27 NOTE — Assessment & Plan Note (Signed)
History of an occluded left renal artery with an atrophic left kidney status post right renal artery PTA and stenting using a covered stent via the left brachial approach by myself 08/22/2014 with renal Dopplers performed 09/17/2018 revealing this to be widely patent.

## 2019-04-27 NOTE — Assessment & Plan Note (Signed)
History of essential hypertension blood pressure measured today 163/99.  She is on no antihypertensive medications.

## 2019-04-27 NOTE — Patient Instructions (Signed)
Medication Instructions:  Your physician recommends that you continue on your current medications as directed. Please refer to the Current Medication list given to you today.  If you need a refill on your cardiac medications before your next appointment, please call your pharmacy.   Lab work: none If you have labs (blood work) drawn today and your tests are completely normal, you will receive your results only by: Marland Kitchen MyChart Message (if you have MyChart) OR . A paper copy in the mail If you have any lab test that is abnormal or we need to change your treatment, we will call you to review the results.  Testing/Procedures: Your physician has requested that you have a renal artery duplex. During this test, an ultrasound is used to evaluate blood flow to the kidneys. Allow one hour for this exam. Do not eat after midnight the day before and avoid carbonated beverages. Take your medications as you usually do. DUE February 2021  PLEASE KEEP YOUR APPOINTMENTS FOR YOUR CAROTID ARTERY DUPLEX AND LOWER EXTREMITY ARTERIAL DUPLEX SCHEDULED FOR 05/06/2019   Follow-Up: At [facility], you and your health needs are our priority.  As part of our continuing mission to provide you with exceptional heart care, we have created designated Provider Care Teams.  These Care Teams include your primary Cardiologist (physician) and Advanced Practice Providers (APPs -  Physician Assistants and Nurse Practitioners) who all work together to provide you with the care you need, when you need it. You will need a follow up appointment in 12 months with Dr. Quay Burow.  Please call our office 2 months in advance to schedule this/each appointment.

## 2019-04-27 NOTE — Assessment & Plan Note (Signed)
History of ongoing tobacco abuse despite recently being diagnosed with lung cancer and having radiation therapy recalcitrant to risk factor modification.

## 2019-04-29 ENCOUNTER — Other Ambulatory Visit (HOSPITAL_COMMUNITY): Payer: Self-pay | Admitting: Cardiovascular Disease

## 2019-04-29 DIAGNOSIS — I6523 Occlusion and stenosis of bilateral carotid arteries: Secondary | ICD-10-CM

## 2019-04-29 DIAGNOSIS — I739 Peripheral vascular disease, unspecified: Secondary | ICD-10-CM

## 2019-05-06 ENCOUNTER — Inpatient Hospital Stay (HOSPITAL_COMMUNITY): Admission: RE | Admit: 2019-05-06 | Payer: Medicare Other | Source: Ambulatory Visit

## 2019-05-13 DIAGNOSIS — H25813 Combined forms of age-related cataract, bilateral: Secondary | ICD-10-CM | POA: Diagnosis not present

## 2019-05-17 ENCOUNTER — Ambulatory Visit (HOSPITAL_COMMUNITY)
Admission: RE | Admit: 2019-05-17 | Discharge: 2019-05-17 | Disposition: A | Discharge status: Home / Self Care | Payer: Medicare Other | Source: Ambulatory Visit | Attending: Cardiology | Admitting: Cardiology

## 2019-05-17 ENCOUNTER — Ambulatory Visit (HOSPITAL_BASED_OUTPATIENT_CLINIC_OR_DEPARTMENT_OTHER)
Admission: RE | Admit: 2019-05-17 | Discharge: 2019-05-17 | Disposition: A | Discharge status: Home / Self Care | Payer: Medicare Other | Source: Ambulatory Visit | Attending: Cardiovascular Disease | Admitting: Cardiovascular Disease

## 2019-05-17 ENCOUNTER — Other Ambulatory Visit: Payer: Self-pay

## 2019-05-17 ENCOUNTER — Other Ambulatory Visit (HOSPITAL_COMMUNITY): Payer: Self-pay | Admitting: Cardiovascular Disease

## 2019-05-17 DIAGNOSIS — I739 Peripheral vascular disease, unspecified: Secondary | ICD-10-CM | POA: Insufficient documentation

## 2019-05-17 DIAGNOSIS — Z9889 Other specified postprocedural states: Secondary | ICD-10-CM

## 2019-05-17 DIAGNOSIS — I6523 Occlusion and stenosis of bilateral carotid arteries: Secondary | ICD-10-CM

## 2019-05-18 ENCOUNTER — Other Ambulatory Visit: Payer: Self-pay | Admitting: *Deleted

## 2019-05-18 ENCOUNTER — Encounter: Payer: Self-pay | Admitting: *Deleted

## 2019-05-18 DIAGNOSIS — I739 Peripheral vascular disease, unspecified: Secondary | ICD-10-CM

## 2019-05-18 DIAGNOSIS — I6523 Occlusion and stenosis of bilateral carotid arteries: Secondary | ICD-10-CM

## 2019-05-19 ENCOUNTER — Telehealth: Payer: Self-pay | Admitting: Emergency Medicine

## 2019-05-19 DIAGNOSIS — I739 Peripheral vascular disease, unspecified: Secondary | ICD-10-CM | POA: Diagnosis not present

## 2019-05-19 DIAGNOSIS — E782 Mixed hyperlipidemia: Secondary | ICD-10-CM | POA: Diagnosis not present

## 2019-05-19 DIAGNOSIS — I129 Hypertensive chronic kidney disease with stage 1 through stage 4 chronic kidney disease, or unspecified chronic kidney disease: Secondary | ICD-10-CM | POA: Diagnosis not present

## 2019-05-19 NOTE — Telephone Encounter (Signed)
Left message for patient to call back tomorrow. Will need to know where she is getting her O2 from since it is not documented in her chart. Will also need to know if she is using O2 at night or during the day or both.

## 2019-05-21 NOTE — Telephone Encounter (Signed)
Call returned to patient, she states this is not a good time and she will call back with information.

## 2019-05-21 NOTE — Telephone Encounter (Signed)
Call returned to patient, she is currently using [employer] for her oxygen supplies. She reports she was started on oxygen from the hospital. She states she was notified by [employer] that her oxygen need to be re-certified. Patient had a OV 07/14 and was qualified at that time. Will give [employer] a call to make aware.   Left message with Levada Dy. Will await a call back.

## 2019-05-21 NOTE — Telephone Encounter (Signed)
Patient is returning phone call.  Patient phone number is [phone].

## 2019-05-24 NOTE — Telephone Encounter (Signed)
The qualifying walk from July will not be useful as it is more than 96 days old. Pt will have to be scheduled for an OV and requalify at that time.  LMTCB x1 for pt.

## 2019-05-25 NOTE — Telephone Encounter (Signed)
[name], Orson Ape, [place], LPN; Randa Spike, CMA; [name], MD; Kendra Watts; Elon Alas        I have heard back from our physician services team on this patients oxygen recert. They have advised she needs to be seen before 07/15/19 to get her o2 recertified. She has an appt with her pcp on 11/4 and we are going to try and get the notes from that appt, however it would be best if she were seen by her pulm MD because that is who will sign the CMN. Can you possibly get her in to discuss her oxygen usage, do a revised walk test, write revised order if anything changes and discuss her chronic resp issues, symptoms, and the alternate medications/treatments she has tried?  Once she has the appointment please let us know and we will pull the notes.   Thanks

## 2019-05-25 NOTE — Telephone Encounter (Signed)
ATC patient unable to reach LM to call back office (x1)  

## 2019-05-26 DIAGNOSIS — I739 Peripheral vascular disease, unspecified: Secondary | ICD-10-CM | POA: Diagnosis not present

## 2019-05-26 DIAGNOSIS — Z7189 Other specified counseling: Secondary | ICD-10-CM | POA: Diagnosis not present

## 2019-05-26 DIAGNOSIS — N390 Urinary tract infection, site not specified: Secondary | ICD-10-CM | POA: Diagnosis not present

## 2019-05-26 DIAGNOSIS — R7303 Prediabetes: Secondary | ICD-10-CM | POA: Diagnosis not present

## 2019-05-26 DIAGNOSIS — E46 Unspecified protein-calorie malnutrition: Secondary | ICD-10-CM | POA: Diagnosis not present

## 2019-05-26 DIAGNOSIS — Z23 Encounter for immunization: Secondary | ICD-10-CM | POA: Diagnosis not present

## 2019-05-26 DIAGNOSIS — I129 Hypertensive chronic kidney disease with stage 1 through stage 4 chronic kidney disease, or unspecified chronic kidney disease: Secondary | ICD-10-CM | POA: Diagnosis not present

## 2019-05-26 DIAGNOSIS — M25512 Pain in left shoulder: Secondary | ICD-10-CM | POA: Diagnosis not present

## 2019-05-26 DIAGNOSIS — R3915 Urgency of urination: Secondary | ICD-10-CM | POA: Diagnosis not present

## 2019-05-26 DIAGNOSIS — E782 Mixed hyperlipidemia: Secondary | ICD-10-CM | POA: Diagnosis not present

## 2019-05-26 NOTE — Telephone Encounter (Signed)
ATC at home number - line rang numerous times with no answer and no option to leave message.  ATC pt at mobile number - went straight to VM but VM is not set up yet.  WCB.

## 2019-05-26 NOTE — Telephone Encounter (Signed)
Attempted to call pt but unable to reach her. Left message for pt to return call. 

## 2019-05-26 NOTE — Telephone Encounter (Signed)
Pt returned missed call

## 2019-05-26 NOTE — Telephone Encounter (Signed)
Pt returned missed call and would like a call back 

## 2019-05-26 NOTE — Telephone Encounter (Signed)
I called and spoke with the patient and made her aware that we will need to walk her to qualify her here in the office and she was agreeable. I have scheduled her for 06/02/19 with Derl Barrow, NP. Nothing further is needed at this time.

## 2019-06-02 ENCOUNTER — Encounter: Payer: Self-pay | Admitting: Primary Care

## 2019-06-02 ENCOUNTER — Ambulatory Visit (INDEPENDENT_AMBULATORY_CARE_PROVIDER_SITE_OTHER): Payer: Medicare Other | Admitting: Primary Care

## 2019-06-02 ENCOUNTER — Other Ambulatory Visit: Payer: Self-pay

## 2019-06-02 DIAGNOSIS — Z720 Tobacco use: Secondary | ICD-10-CM | POA: Diagnosis not present

## 2019-06-02 DIAGNOSIS — J9611 Chronic respiratory failure with hypoxia: Secondary | ICD-10-CM

## 2019-06-02 NOTE — Assessment & Plan Note (Signed)
-   Re-qualified for oxygen; DME [employer]  - Continue 2L oxygen on exertion and 4L at night

## 2019-06-02 NOTE — Assessment & Plan Note (Signed)
-   Still smoking 1/2 ppd - Encourage smoking cessation, patient is not ready to quit

## 2019-06-02 NOTE — Patient Instructions (Signed)
Orders: Re-new oxygen order with [employer] 2L continuous oxygen on exertion; 4L oxygen at night   Recommendations: Continue Anoro 1 puff daily  (we may consider changing to [place] in January or may process PA for bevespi)  Follow-up: Dr. Lamonte Sakai in January

## 2019-06-02 NOTE — Assessment & Plan Note (Signed)
-   Tolerating Anoro but did better on Bevespi - She just received 90 worth of inhaler medication. Plan continue Anoro 1 puff daily until January then consider changing to [facility] or processing PA for [employer]

## 2019-06-02 NOTE — Progress Notes (Signed)
@Patient  ID: Kendra Watts, female    DOB: [birth year], 73 y.o.   MRN: [id number]  Chief Complaint  Patient presents with   Follow-up    Referring provider: Merrilee Seashore, MD  HPI: 73 year old female, current every day smoker (60 pack year hx). 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 Patient of Dr. Lamonte Sakai, last seen on 02/02/19. She is being followed by [name] and Dr. Sondra Come, she completed SBRT in April 2020. Maintained on Anoro. Wears nocturnal oxygen but does not use during the day.   06/02/2019 Patient presents today for oxygen re-certification. DME company is [employer]. She was started on oxygen when she was in the hospital and qualified for oxygen during an office visit in July.  She does not like Anoro, feels it has not helped. She did better on Bevespi but could not afford medication. No acute respiratory symptoms. She has a chronic cough. Continues wearing 4L oxygen at night. Denies wheezing or chest tightness.   Testing reviewed: MRI brain from 09/08/2018 does not show any evidence of metastatic disease  PFTs 05/14/2017 - FEV1 0.98 L, 52% predicted  No Known Allergies  Immunization History  Administered Date(s) Administered   Influenza Split 04/03/2012   Influenza, High Dose Seasonal PF 07/13/2018   Influenza-Unspecified 05/22/2014   Pneumococcal-Unspecified 04/21/2014    Past Medical History:  Diagnosis Date   Anemia    Aortic arch anomaly    arteria lusoria    Breast cancer ([place]) 09/22/2015   Malignant   Breast cancer of upper-outer quadrant of left female breast ([place]) 09/08/2015   Cataract, immature    CHF (congestive heart failure) ([place])    Acute CHF-06/2018   COPD (chronic obstructive pulmonary disease) ([facility])    Heart murmur    states no  known problems   History of hyperthyroidism    Hyperlipidemia    Hypertension    states under control with meds., has been on med. x "long time"   Hypokalemia    from last physical.    Hypothyroidism    Nonfunctioning kidney    left   Personal history of radiation therapy    2017   Pulmonary nodules    Bilateral   Radiation 10/30/15-11/28/15   left breast 42.72 Gy, boosted to 10 Gy   Renal artery stenosis ([facility])    Tobacco abuse    Wears partial dentures    upper and lower    Tobacco History: Social History   Tobacco Use  Smoking Status Current Every Day Smoker   Packs/day: 1.50   Years: 40.00   Pack years: 60.00   Types: Cigarettes  Smokeless Tobacco Never Used  Tobacco Comment   as of 09/14/2018, less than 1 pack/day   Ready to quit: Not Answered Counseling given: Not Answered Comment: as of 09/14/2018, less than 1 pack/day   Outpatient Medications Prior to Visit  Medication Sig Dispense Refill   albuterol (VENTOLIN HFA) 108 (90 Base) MCG/ACT inhaler Inhale 2 puffs into the lungs every 6 (six) hours as needed for wheezing or shortness of breath. 8 g 3   aspirin 81 MG tablet Take 81 mg by mouth daily.     Calcium-Magnesium-Vitamin D (CALCIUM 1200+D3 PO) Take 1 tablet by mouth daily.     doxazosin (CARDURA) 8 MG tablet Take 8 mg by mouth daily.  furosemide (LASIX) 20 MG tablet Take 1 tablet (20 mg total) by mouth daily. 30 tablet 0   levothyroxine (SYNTHROID, LEVOTHROID) 88 MCG tablet Take 88 mcg by mouth daily before breakfast.     Multiple Vitamins-Minerals (MULTIVITAMIN WITH MINERALS) tablet Take 1 tablet by mouth daily.      Naphazoline HCl (CLEAR EYES OP) Place 2 drops into both eyes daily.     pantoprazole (PROTONIX) 40 MG tablet Take 1 tablet (40 mg total) by mouth daily. 30 tablet 0   rosuvastatin (CRESTOR) 10 MG tablet Take 10 mg by mouth daily.      tamoxifen (NOLVADEX) 20 MG tablet TAKE 1 TABLET BY MOUTH  DAILY 90 tablet 3    umeclidinium-vilanterol (ANORO ELLIPTA) 62.5-25 MCG/INH AEPB Inhale 1 puff into the lungs daily.     No facility-administered medications prior to visit.    Review of Systems  Review of Systems  Constitutional: Negative.   Respiratory: Positive for cough and shortness of breath. Negative for chest tightness and wheezing.   Cardiovascular: Negative.    Physical Exam  BP 136/80 (BP Location: Left Arm)    Pulse (!) 101    Temp 97.7 F (36.5 C) (Oral)    Ht 5' (1.524 m)    Wt 93 lb 6.4 oz (42.4 kg)    SpO2 97%    BMI 18.24 kg/m  Physical Exam Constitutional:      General: She is not in acute distress.    Appearance: Normal appearance. She is not ill-appearing.  HENT:     Head: Normocephalic and atraumatic.  Neck:     Musculoskeletal: Normal range of motion and neck supple.  Cardiovascular:     Rate and Rhythm: Regular rhythm. Tachycardia present.  Pulmonary:     Effort: Pulmonary effort is normal.     Breath sounds: Normal breath sounds. No wheezing, rhonchi or rales.  Musculoskeletal: Normal range of motion.  Skin:    General: Skin is warm and dry.  Neurological:     General: No focal deficit present.     Mental Status: She is alert and oriented to person, place, and time. Mental status is at baseline.  Psychiatric:        Mood and Affect: Mood normal.        Behavior: Behavior normal.        Thought Content: Thought content normal.        Judgment: Judgment normal.      Lab Results:  CBC    Component Value Date/Time   WBC 7.4 08/26/2018 1340   RBC 4.40 08/26/2018 1340   HGB 12.6 08/26/2018 1340   HGB 14.4 09/13/2015 1224   HCT 38.8 08/26/2018 1340   HCT 43.9 09/13/2015 1224   PLT 222 08/26/2018 1340   PLT 212 09/13/2015 1224   MCV 88.2 08/26/2018 1340   MCV 96.3 09/13/2015 1224   MCH 28.6 08/26/2018 1340   MCHC 32.5 08/26/2018 1340   RDW 20.4 (H) 08/26/2018 1340   RDW 15.7 (H) 09/13/2015 1224   LYMPHSABS 1.3 07/13/2018 0319   LYMPHSABS 1.2 09/13/2015 1224     MONOABS 1.3 (H) 07/13/2018 0319   MONOABS 0.5 09/13/2015 1224   EOSABS 0.0 07/13/2018 0319   EOSABS 0.1 09/13/2015 1224   BASOSABS 0.0 07/13/2018 0319   BASOSABS 0.1 09/13/2015 1224    BMET    Component Value Date/Time   NA 140 02/24/2019 1142   NA 139 09/13/2015 1224   K 4.1 02/24/2019 1142   K  4.1 09/13/2015 1224   CL 102 02/24/2019 1142   CO2 26 02/24/2019 1142   CO2 22 09/13/2015 1224   GLUCOSE 135 (H) 02/24/2019 1142   GLUCOSE 157 (H) 09/13/2015 1224   BUN 20 02/24/2019 1142   BUN 14.8 09/13/2015 1224   CREATININE 0.96 02/24/2019 1142   CREATININE 1.0 09/13/2015 1224   CALCIUM 9.4 02/24/2019 1142   CALCIUM 9.8 09/13/2015 1224   GFRNONAA 59 (L) 02/24/2019 1142   GFRAA >60 02/24/2019 1142    BNP    Component Value Date/Time   BNP 363.3 (H) 07/06/2018 0058    ProBNP No results found for: PROBNP  Imaging: Vas [place] Abi With/wo Tbi  Result Date: 05/17/2019 LOWER EXTREMITY DOPPLER STUDY Indications: History of PAD; known iliac disease. Patient c/o cramping in both              feet at night. She denies any claudication symptoms. High Risk Factors: Hypertension, hyperlipidemia, current smoker.  Comparison Study: In 04/2018, an arterial Doppler showed an ABI of .86 on the                   right and .82 on the left. Performing Technologist: Sharlett Iles RVT  Examination Guidelines: A complete evaluation includes at minimum, Doppler waveform signals and systolic blood pressure reading at the level of bilateral brachial, anterior tibial, and posterior tibial arteries, when vessel segments are accessible. Bilateral testing is considered an integral part of a complete examination. Photoelectric Plethysmograph (PPG) waveforms and toe systolic pressure readings are included as required and additional duplex testing as needed. Limited examinations for reoccurring indications may be performed as noted.  ABI Findings: +--------+------------------+-----+----------+--------+  Right     Rt Pressure (mmHg) Index Waveform   Comment   +--------+------------------+-----+----------+--------+  Brachial 144                                           +--------+------------------+-----+----------+--------+  ATA      169                0.87  biphasic             +--------+------------------+-----+----------+--------+  PTA      157                0.81  monophasic           +--------+------------------+-----+----------+--------+  PERO     134                0.69  monophasic           +--------+------------------+-----+----------+--------+ +--------+------------------+-----+--------+---------+  Left     Lt Pressure (mmHg) Index Waveform Comment    +--------+------------------+-----+--------+---------+  Brachial 194                                          +--------+------------------+-----+--------+---------+  ATA      172                0.89  biphasic            +--------+------------------+-----+--------+---------+  PTA                               absent   inaudible  +--------+------------------+-----+--------+---------+  PERO     171                0.88  biphasic            +--------+------------------+-----+--------+---------+ +-------+-----------+-----------+------------+------------+  ABI/TBI Today's ABI Today's TBI Previous ABI Previous TBI  +-------+-----------+-----------+------------+------------+  Right   .87         .61         .86          .42           +-------+-----------+-----------+------------+------------+  Left    .89         .62         .82          .44           +-------+-----------+-----------+------------+------------+ Bilateral ABIs appear essentially unchanged compared to prior study on 04/30/2018. Bilateral TBIs appear mildly increased; however, remain abnormal when compared to prior study on 04/30/2018.  Summary: Right: Resting right ankle-brachial index indicates mild right lower extremity arterial disease. The right toe-brachial index is abnormal. Left: Resting left  ankle-brachial index indicates mild left lower extremity arterial disease. The left toe-brachial index is abnormal.  *See table(s) above for measurements and observations.  Suggest follow up study in 12 months. Electronically signed by Ida Rogue MD on 05/17/2019 at 5:09:33 PM.    Final    Vas US Carotid  Result Date: 05/17/2019 Carotid Arterial Duplex Study Indications:       Right endarterectomy and history of bilateral carotid artery                    stenosis and known right subclavian steal syndrome. Patient                    denies any cerebrovascular symptoms. Risk Factors:      Hypertension, hyperlipidemia, current smoker, PAD. Other Factors:     History of right-sided carotid endarterectomy on 04/02/2012. Comparison Study:  In 04/2018, a carotid duplex showed velocities of 97/9 cm/s                    in the RICA, s/p endarterectomy and 56/31 cm/s in the LICA. Performing Technologist: Sharlett Iles RVT  Examination Guidelines: A complete evaluation includes B-mode imaging, spectral Doppler, color Doppler, and power Doppler as needed of all accessible portions of each vessel. Bilateral testing is considered an integral part of a complete examination. Limited examinations for reoccurring indications may be performed as noted.  Right Carotid Findings: +----------+--------+-------+--------+---------------------+-------------------+             PSV cm/s EDV     Stenosis Plaque Description    Comments                                  cm/s                                                        +----------+--------+-------+--------+---------------------+-------------------+  CCA Prox   133      14               calcific and  irregular                                  +----------+--------+-------+--------+---------------------+-------------------+  CCA Mid    81       8                calcific and                                                                      irregular                                  +----------+--------+-------+--------+---------------------+-------------------+  CCA Distal 80       8                                      intimal hyperplasia  +----------+--------+-------+--------+---------------------+-------------------+  ICA Prox   47       9                heterogenous          intimal hyperplasia  +----------+--------+-------+--------+---------------------+-------------------+  ICA Mid    65       14      1-39%    heterogenous          intimal hyperplasia  +----------+--------+-------+--------+---------------------+-------------------+  ICA Distal 112      23                                                          +----------+--------+-------+--------+---------------------+-------------------+  ECA        104      8                calcific and                                                                     irregular                                  +----------+--------+-------+--------+---------------------+-------------------+ +----------+--------+-------+----------------------+-------------------+             PSV cm/s EDV cms Describe               Arm Pressure (mmHG)  +----------+--------+-------+----------------------+-------------------+  Subclavian 183              Stenotic and turbulent 144                  +----------+--------+-------+----------------------+-------------------+ +---------+--------+--+--------+-+----------+  Vertebral PSV cm/s 93 EDV cm/s 7 Retrograde  +---------+--------+--+--------+-+----------+ Left Carotid Findings: +----------+--------+--------+--------+--------------------------+---------+  PSV cm/s EDV cm/s Stenosis Plaque Description         Comments   +----------+--------+--------+--------+--------------------------+---------+  CCA Prox   87       9                 calcific and irregular                 +----------+--------+--------+--------+--------------------------+---------+  CCA Mid    96       11                calcific and irregular                +----------+--------+--------+--------+--------------------------+---------+  CCA Distal 104      11                heterogenous and irregular            +----------+--------+--------+--------+--------------------------+---------+  ICA Prox   84       11       1-39%    heterogenous and irregular turbulent  +----------+--------+--------+--------+--------------------------+---------+  ICA Mid    57       12                                                      +----------+--------+--------+--------+--------------------------+---------+  ICA Distal 41       7                                            tortuous   +----------+--------+--------+--------+--------------------------+---------+  ECA        191      17                heterogenous                          +----------+--------+--------+--------+--------------------------+---------+ +----------+--------+--------+----------------+-------------------+             PSV cm/s EDV cm/s Describe         Arm Pressure (mmHG)  +----------+--------+--------+----------------+-------------------+  Subclavian 193               Multiphasic, WNL 194                  +----------+--------+--------+----------------+-------------------+ +---------+--------+--+--------+--+---------+  Vertebral PSV cm/s 78 EDV cm/s 10 Antegrade  +---------+--------+--+--------+--+---------+  Summary: Right Carotid: Velocities in the right ICA are consistent with a 1-39% stenosis.                Non-hemodynamically significant plaque <50% noted in the CCA.                The RICA velocities remain within normal range and have decreased                compared to prior exam, s/p endarterectomy. Left Carotid: Velocities in the left ICA are consistent with a 1-39% stenosis.               Non-hemodynamically significant plaque <50% noted in the CCA.                The LICA velocities remain within normal range and essentially  stable compared to prior exam. Vertebrals:  Left vertebral artery demonstrates antegrade flow. Right vertebral              artery demonstrates retrograde flow. Subclavians: Right subclavian artery was stenotic. Right subclavian artery flow              was disturbed. Normal flow hemodynamics were seen in the left              subclavian artery. Right subclavian steal. There is a 50 mmHg pressure difference between the arms, right being the lowest. Monophasic flow noted in the right brachial artery, biphasic flow in the left. *See table(s) above for measurements and observations. Suggest follow up study in 12 months. Electronically signed by Ida Rogue MD on 05/17/2019 at 5:17:51 PM.    Final      Assessment & Plan:   Chronic obstructive pulmonary disease ([place]) - Tolerating Anoro but did better on Bevespi - She just received 90 worth of inhaler medication. Plan continue Anoro 1 puff daily until January then consider changing to [facility] or processing PA for Bevespi  Chronic respiratory failure with hypoxia ([place]) - Re-qualified for oxygen; DME [employer]  - Continue 2L oxygen on exertion and 4L at night  Tobacco abuse - Still smoking 1/2 ppd - Encourage smoking cessation, patient is not ready to quit   Martyn Ehrich, NP 06/02/2019

## 2019-06-04 ENCOUNTER — Telehealth: Payer: Self-pay | Admitting: *Deleted

## 2019-06-04 NOTE — Telephone Encounter (Signed)
CALLED PATIENT TO INFORM OF CT FOR 06-11-19 - ARRIVAL TIME- 12:45 PM @ [facility], NO RESTRICTIONS TO TEST, AND PATIENT TO FU WITH DR. KINARD ON 06-21-19 @ 11 AM FOR RESULTS, SPOKE WITH PATIENT AND SHE IS AWARE OF THESE APPTS.

## 2019-06-07 DIAGNOSIS — R000 Tachycardia, unspecified: Secondary | ICD-10-CM | POA: Diagnosis not present

## 2019-06-11 ENCOUNTER — Ambulatory Visit (HOSPITAL_COMMUNITY)
Admission: RE | Admit: 2019-06-11 | Discharge: 2019-06-11 | Disposition: A | Discharge status: Home / Self Care | Payer: Medicare Other | Source: Ambulatory Visit | Attending: Radiation Oncology | Admitting: Radiation Oncology

## 2019-06-11 ENCOUNTER — Other Ambulatory Visit: Payer: Self-pay

## 2019-06-11 DIAGNOSIS — C3490 Malignant neoplasm of unspecified part of unspecified bronchus or lung: Secondary | ICD-10-CM | POA: Diagnosis not present

## 2019-06-11 DIAGNOSIS — C3492 Malignant neoplasm of unspecified part of left bronchus or lung: Secondary | ICD-10-CM

## 2019-06-14 ENCOUNTER — Ambulatory Visit: Payer: Self-pay | Admitting: Radiation Oncology

## 2019-06-16 DIAGNOSIS — M25512 Pain in left shoulder: Secondary | ICD-10-CM | POA: Diagnosis not present

## 2019-06-16 DIAGNOSIS — R000 Tachycardia, unspecified: Secondary | ICD-10-CM | POA: Diagnosis not present

## 2019-06-21 ENCOUNTER — Other Ambulatory Visit: Payer: Self-pay

## 2019-06-21 ENCOUNTER — Encounter: Payer: Self-pay | Admitting: Radiation Oncology

## 2019-06-21 ENCOUNTER — Ambulatory Visit
Admission: RE | Admit: 2019-06-21 | Discharge: 2019-06-21 | Disposition: A | Discharge status: Home / Self Care | Payer: Medicare Other | Source: Ambulatory Visit | Attending: Radiation Oncology | Admitting: Radiation Oncology

## 2019-06-21 VITALS — BP 108/76 | HR 77 | Temp 98.5°F | Resp 18 | Wt 93.6 lb

## 2019-06-21 DIAGNOSIS — C3412 Malignant neoplasm of upper lobe, left bronchus or lung: Secondary | ICD-10-CM | POA: Diagnosis not present

## 2019-06-21 DIAGNOSIS — I700 Atherosclerosis of aorta: Secondary | ICD-10-CM | POA: Insufficient documentation

## 2019-06-21 DIAGNOSIS — C3411 Malignant neoplasm of upper lobe, right bronchus or lung: Secondary | ICD-10-CM | POA: Diagnosis not present

## 2019-06-21 DIAGNOSIS — J439 Emphysema, unspecified: Secondary | ICD-10-CM | POA: Diagnosis not present

## 2019-06-21 DIAGNOSIS — Z7982 Long term (current) use of aspirin: Secondary | ICD-10-CM | POA: Insufficient documentation

## 2019-06-21 DIAGNOSIS — Z853 Personal history of malignant neoplasm of breast: Secondary | ICD-10-CM | POA: Diagnosis not present

## 2019-06-21 DIAGNOSIS — Z08 Encounter for follow-up examination after completed treatment for malignant neoplasm: Secondary | ICD-10-CM | POA: Diagnosis not present

## 2019-06-21 DIAGNOSIS — C3492 Malignant neoplasm of unspecified part of left bronchus or lung: Secondary | ICD-10-CM

## 2019-06-21 DIAGNOSIS — C3491 Malignant neoplasm of unspecified part of right bronchus or lung: Secondary | ICD-10-CM

## 2019-06-21 DIAGNOSIS — Z85118 Personal history of other malignant neoplasm of bronchus and lung: Secondary | ICD-10-CM | POA: Diagnosis not present

## 2019-06-21 DIAGNOSIS — Z79899 Other long term (current) drug therapy: Secondary | ICD-10-CM | POA: Diagnosis not present

## 2019-06-21 NOTE — Progress Notes (Signed)
Radiation Oncology         [phone] ________________________________  Name: Kendra Watts MRN: [id number]  Date: 06/21/2019  DOB: [birth year]  Follow-Up Visit Note  CC: Merrilee Seashore, MD  Nicholas Lose, MD    ICD-10-CM   1. Squamous cell lung cancer, left (HCC)  C34.92   2. Adenocarcinoma, lung, right ([place])  C34.91     Diagnosis:    1. Synchronous bilateral stage 1 lung cancers  (a) Squamous cell carcinoma in the left upper lobe stage IA3 (cT1c, N0, M0)  (b) Adenocarcinoma in the right upper lobe stage IA2 (cT1b, N0, M0)   2. Stage IA (pT1c, pN0) left breast invasive ductal carcinoma, ER+ Ysidro Evert /Her2-  Interval Since Last Radiation: Seven months and two weeks. 1. 10/27/2018, 10/29/2018, 11/02/2018: Left and Right Lung targets were each treated to 54 Gy in 3 fractions 2. 10/30/2015 - 11/28/2015: Left Breast with Boost for a total of 52.72 Gy in 21 fractions  Narrative: The patient returns today for routine follow-up.   Since her last visit, she followed up with Geraldo Pitter, pulmonology NP, on 06/02/2019. Patient was started on 2L oxygen on exertion and 4L oxygen at night. Still smoking 1/2 ppd.  CT of chest from 06/11/2019 showed: 10 mm right upper lobe nodule, unchanged, with surrounding radiation changes; 10 mm posterior left upper lobe nodule, mildly improved, with surrounding radiation changes; 6 mm irregular/spiculated right lower lobe nodule, stable versus mildly improved. No evidence of new/progressive metastatic disease.  On review of systems, she reports no pain within the chest area. She denies hemoptysis and all other symptoms.  ALLERGIES:  has No Known Allergies.  Meds: Current Outpatient Medications  Medication Sig Dispense Refill  . albuterol (VENTOLIN HFA) 108 (90 Base) MCG/ACT inhaler Inhale 2 puffs into the lungs every 6 (six) hours as needed for wheezing or shortness of breath. 8 g 3  . aspirin 81 MG tablet Take 81 mg by mouth daily.    .  Calcium-Magnesium-Vitamin D (CALCIUM 1200+D3 PO) Take 1 tablet by mouth daily.    Marland Kitchen doxazosin (CARDURA) 8 MG tablet Take 8 mg by mouth daily.     . furosemide (LASIX) 20 MG tablet Take 1 tablet (20 mg total) by mouth daily. 30 tablet 0  . levothyroxine (SYNTHROID, LEVOTHROID) 88 MCG tablet Take 88 mcg by mouth daily before breakfast.    . Multiple Vitamins-Minerals (MULTIVITAMIN WITH MINERALS) tablet Take 1 tablet by mouth daily.     . Naphazoline HCl (CLEAR EYES OP) Place 2 drops into both eyes daily.    . pantoprazole (PROTONIX) 40 MG tablet Take 1 tablet (40 mg total) by mouth daily. 30 tablet 0  . rosuvastatin (CRESTOR) 10 MG tablet Take 10 mg by mouth daily.     . tamoxifen (NOLVADEX) 20 MG tablet TAKE 1 TABLET BY MOUTH  DAILY 90 tablet 3  . umeclidinium-vilanterol (ANORO ELLIPTA) 62.5-25 MCG/INH AEPB Inhale 1 puff into the lungs daily.     No current facility-administered medications for this encounter.     Physical Findings: The patient is in no acute distress. Patient is alert and oriented.  weight is 93 lb 9.6 oz (42.5 kg). Her temperature is 98.5 F (36.9 C). Her blood pressure is 108/76 and her pulse is 77. Her respiration is 18 and oxygen saturation is 94%. .  No significant changes. Lungs are clear to auscultation bilaterally. Heart has regular rate and rhythm. No palpable cervical, supraclavicular, or axillary adenopathy. Abdomen soft, non-tender, normal bowel  sounds.    Lab Findings: Lab Results  Component Value Date   WBC 7.4 08/26/2018   HGB 12.6 08/26/2018   HCT 38.8 08/26/2018   MCV 88.2 08/26/2018   PLT 222 08/26/2018    Radiographic Findings: Ct Chest Wo Contrast  Result Date: 06/11/2019 CLINICAL DATA:  Non-small cell lung cancer EXAM: CT CHEST WITHOUT CONTRAST TECHNIQUE: Multidetector CT imaging of the chest was performed following the standard protocol without IV contrast. COMPARISON:  None. FINDINGS: Cardiovascular: The heart is normal in size. No pericardial  effusion. No evidence of thoracic aortic aneurysm. Atherosclerotic calcifications of the aortic arch. Aberrant right subclavian artery. Three vessel coronary atherosclerosis. Mediastinum/Nodes: No suspicious mediastinal lymphadenopathy. Lungs/Pleura: 8 x 12 mm medial right upper lobe nodule (series 5/image 23), unchanged when remeasured in a similar fashion. Adjacent fiducial markers and radiation changes. 10 mm posterior left upper lobe nodule (series 5/image 44), previously 13 mm when measured in a similar fashion, with adjacent fiducial markers on surrounding radiation changes. 6 mm irregular/spiculated right lower lobe nodule (series 5/image 102), previously 7 mm, grossly unchanged. No new/suspicious pulmonary nodules. Subpleural reticulation/radiation changes in the anterior left upper lobe. Moderate centrilobular and paraseptal emphysematous changes, upper lung predominant. No focal consolidation. No pleural effusion or pneumothorax. Upper Abdomen: Visualized upper abdomen is notable for extensive vascular calcifications and left renal cortical scarring/atrophy. Musculoskeletal: Postsurgical changes related to left breast lumpectomy. Mild degenerative changes of the upper lumbar spine. IMPRESSION: 10 mm (volumetric mean) right upper lobe nodule, unchanged, with surrounding radiation changes. 10 mm posterior left upper lobe nodule, mildly improved, with surrounding radiation changes. 6 mm irregular/spiculated right lower lobe nodule, stable versus mildly improved. Status post left breast lumpectomy. No evidence of new/progressive metastatic disease. Aortic Atherosclerosis (ICD10-I70.0) and Emphysema (ICD10-J43.9). Electronically Signed   By: [name] Hy M.D.   On: 06/11/2019 14:22    Impression:  Synchronous bilateral stage 1 lung cancers  Clinically stable, no evidence of recurrence on clinical exam.  Recent chest CT scan favorable  Plan: Patient will return for routine follow-up in 6 months with  a repeat chest CT scan prior to this visit.  ____________________________________   Blair Promise, PhD, MD  This document serves as a record of services personally performed by [name], MD. It was created on his behalf by Clerance Lav, a trained medical scribe. The creation of this record is based on the scribe's personal observations and the provider's statements to them. This document has been checked and approved by the attending provider.

## 2019-06-21 NOTE — Progress Notes (Signed)
Pt presents today for f/u with Dr. Sondra Come. Pt denies c/o pain. Pt reports PCP has prescribed beta blocker for heart rate, will call with med and dosage to reconcile medication list. Pt reports congestion and states she coughs that up, white sputum. Pt denies hemoptysis. Pt denies difficulty swallowing. Pt reports using supplemental oxygen at home.  Pt denies difficulty breathing, SOB.   BP 108/76 (BP Location: Right Arm, Patient Position: Sitting, Cuff Size: Normal)   Pulse 77   Temp 98.5 F (36.9 C)   Resp 18   Wt 93 lb 9.6 oz (42.5 kg)   SpO2 94%   BMI 18.28 kg/m   Wt Readings from Last 3 Encounters:  06/21/19 93 lb 9.6 oz (42.5 kg)  06/02/19 93 lb 6.4 oz (42.4 kg)  04/27/19 96 lb 9.6 oz (43.8 kg)   Loma Sousa, RN BSN

## 2019-06-21 NOTE — Patient Instructions (Signed)
Coronavirus (COVID-19) Are you at risk?  Are you at risk for the Coronavirus (COVID-19)?  To be considered HIGH RISK for Coronavirus (COVID-19), you have to meet the following criteria:  . Traveled to [place], [place], [place], [place] or [place]; or in the [place] to [place], [place], [place], or [place]; and have fever, cough, and shortness of breath within the last 2 weeks of travel OR . Been in close contact with a person diagnosed with COVID-19 within the last 2 weeks and have fever, cough, and shortness of breath . IF YOU DO NOT MEET THESE CRITERIA, YOU ARE CONSIDERED LOW RISK FOR COVID-19.  What to do if you are HIGH RISK for COVID-19?  . If you are having a medical emergency, call 911. . Seek medical care right away. Before you go to a doctor's office, urgent care or emergency department, call ahead and tell them about your recent travel, contact with someone diagnosed with COVID-19, and your symptoms. You should receive instructions from your physician's office regarding next steps of care.  . When you arrive at healthcare provider, tell the healthcare staff immediately you have returned from visiting [place], [place], [place], [place] or [place]; or traveled in the [place] to [place], [place], [place], or [place]; in the last two weeks or you have been in close contact with a person diagnosed with COVID-19 in the last 2 weeks.   . Tell the health care staff about your symptoms: fever, cough and shortness of breath. . After you have been seen by a medical provider, you will be either: o Tested for (COVID-19) and discharged home on quarantine except to seek medical care if symptoms worsen, and asked to  - Stay home and avoid contact with others until you get your results (4-5 days)  - Avoid travel on public transportation if possible (such as bus, train, or airplane) or o Sent to the Emergency Department by EMS for evaluation, COVID-19 testing, and possible  admission depending on your condition and test results.  What to do if you are LOW RISK for COVID-19?  Reduce your risk of any infection by using the same precautions used for avoiding the common cold or flu:  . Wash your hands often with soap and warm water for at least 20 seconds.  If soap and water are not readily available, use an alcohol-based hand sanitizer with at least 60% alcohol.  . If coughing or sneezing, cover your mouth and nose by coughing or sneezing into the elbow areas of your shirt or coat, into a tissue or into your sleeve (not your hands). . Avoid shaking hands with others and consider head nods or verbal greetings only. . Avoid touching your eyes, nose, or mouth with unwashed hands.  . Avoid close contact with people who are sick. . Avoid places or events with large numbers of people in one location, like concerts or sporting events. . Carefully consider travel plans you have or are making. . If you are planning any travel outside or inside the [place], visit the CDC's Travelers' Health webpage for the latest health notices. . If you have some symptoms but not all symptoms, continue to monitor at home and seek medical attention if your symptoms worsen. . If you are having a medical emergency, call 911.   ADDITIONAL HEALTHCARE OPTIONS FOR PATIENTS  [facility] / e-Visit: [email or web address]         [facility]: [phone]  [facility]: [phone]                   [facility]: [phone]   

## 2019-06-28 DIAGNOSIS — M810 Age-related osteoporosis without current pathological fracture: Secondary | ICD-10-CM | POA: Diagnosis not present

## 2019-06-28 DIAGNOSIS — M1990 Unspecified osteoarthritis, unspecified site: Secondary | ICD-10-CM | POA: Diagnosis not present

## 2019-06-28 DIAGNOSIS — M25512 Pain in left shoulder: Secondary | ICD-10-CM | POA: Diagnosis not present

## 2019-06-28 DIAGNOSIS — E213 Hyperparathyroidism, unspecified: Secondary | ICD-10-CM | POA: Diagnosis not present

## 2019-06-28 DIAGNOSIS — M12812 Other specific arthropathies, not elsewhere classified, left shoulder: Secondary | ICD-10-CM | POA: Diagnosis not present

## 2019-06-28 DIAGNOSIS — M25511 Pain in right shoulder: Secondary | ICD-10-CM | POA: Diagnosis not present

## 2019-06-28 DIAGNOSIS — M19012 Primary osteoarthritis, left shoulder: Secondary | ICD-10-CM | POA: Diagnosis not present

## 2019-06-28 DIAGNOSIS — M8580 Other specified disorders of bone density and structure, unspecified site: Secondary | ICD-10-CM | POA: Diagnosis not present

## 2019-06-28 DIAGNOSIS — C3490 Malignant neoplasm of unspecified part of unspecified bronchus or lung: Secondary | ICD-10-CM | POA: Diagnosis not present

## 2019-06-29 NOTE — Addendum Note (Signed)
Encounter addended by: Loma Sousa, RN on: 06/29/2019 3:39 PM  Actions taken: Order Reconciliation Section accessed, Home Medications modified, Medication List reviewed

## 2019-07-20 ENCOUNTER — Telehealth: Payer: Self-pay

## 2019-07-20 ENCOUNTER — Other Ambulatory Visit: Payer: Self-pay | Admitting: Rheumatology

## 2019-07-20 DIAGNOSIS — M75102 Unspecified rotator cuff tear or rupture of left shoulder, not specified as traumatic: Secondary | ICD-10-CM

## 2019-07-20 NOTE — Telephone Encounter (Signed)
Spoke to pt about stents. She is stating that she has 2 and needs to know the exact type of stents otherwise she will not ne able to have her MRI by the end of the year d/t insurance purposes. Only see where she has 1 stent on right renal artery. Will forward to Dr Gwenlyn Found for review.

## 2019-07-20 NOTE — Telephone Encounter (Signed)
-----   Message from Kendra Watts sent at 07/20/2019  4:12 PM EST ----- Hey this the patient I called you about she is having a MRI and need to know what kind of stent she had put in her.

## 2019-07-27 ENCOUNTER — Ambulatory Visit
Admission: RE | Admit: 2019-07-27 | Discharge: 2019-07-27 | Disposition: A | Discharge status: Home / Self Care | Payer: Medicare Other | Source: Ambulatory Visit | Attending: Rheumatology | Admitting: Rheumatology

## 2019-07-27 DIAGNOSIS — M75102 Unspecified rotator cuff tear or rupture of left shoulder, not specified as traumatic: Secondary | ICD-10-CM

## 2019-08-01 ENCOUNTER — Other Ambulatory Visit: Payer: Medicare Other

## 2019-08-02 ENCOUNTER — Encounter: Payer: Self-pay | Admitting: Emergency Medicine

## 2019-08-02 ENCOUNTER — Ambulatory Visit (INDEPENDENT_AMBULATORY_CARE_PROVIDER_SITE_OTHER): Payer: Medicare Other | Admitting: Emergency Medicine

## 2019-08-02 ENCOUNTER — Other Ambulatory Visit: Payer: Self-pay

## 2019-08-02 DIAGNOSIS — J9611 Chronic respiratory failure with hypoxia: Secondary | ICD-10-CM

## 2019-08-02 DIAGNOSIS — J449 Chronic obstructive pulmonary disease, unspecified: Secondary | ICD-10-CM

## 2019-08-02 DIAGNOSIS — C3492 Malignant neoplasm of unspecified part of left bronchus or lung: Secondary | ICD-10-CM

## 2019-08-02 MED ORDER — STIOLTO RESPIMAT 2.5-2.5 MCG/ACT IN AERS: 2.0000 | INHALATION_SPRAY | Freq: Every day | RESPIRATORY_TRACT | 0 refills | Status: DC

## 2019-08-02 NOTE — Assessment & Plan Note (Signed)
Please continue oxygen at 4 L/min at night while sleeping.  You also need to wear 2 L/min with heavy exertion using your portable tanks.

## 2019-08-02 NOTE — Addendum Note (Signed)
Addended by: Lia Foyer R on: 08/02/2019 03:11 PM   Modules accepted: Orders

## 2019-08-02 NOTE — Assessment & Plan Note (Signed)
Continue to follow with Dr. Sondra Come to and get your CT scans of the chest as planned

## 2019-08-02 NOTE — Patient Instructions (Signed)
Temporarily stop Anoro. Start Stiolto 2 puffs once daily.  If you get more benefit from this medication we will continue it going forward. Keep your albuterol available to use 2 puffs if needed for shortness of breath, chest tightness, wheezing. We will write a note regarding your jury summons Please continue oxygen at 4 L/min at night while sleeping.  You also need to wear 2 L/min with heavy exertion using your portable tanks. Continue to follow with Dr. Sondra Come to and get your CT scans of the chest as planned Follow with Dr Lamonte Sakai in 1 month with phone visit

## 2019-08-02 NOTE — Assessment & Plan Note (Addendum)
Temporarily stop Anoro. Start Stiolto 2 puffs once daily.  If you get more benefit from this medication we will continue it going forward. Keep your albuterol available to use 2 puffs if needed for shortness of breath, chest tightness, wheezing. We will write a note regarding your jury summons Follow in 1 month to discuss the [facility]

## 2019-08-02 NOTE — Progress Notes (Signed)
Subjective:    Patient ID: Kendra Watts, female    DOB: Dec 01, 1945, 74 y.o.   MRN: [id number]  HPI 74 year old smoker (60 pack years) with a history of L breast cancer (2017) treated with lumpectomy and radiation therapy, renal artery stenosis, hypothyroidism, hypertension, anemia, COPD with severe obstruction noted on pulmonary function testing from 05/14/2017 (FEV1 0.98 L, 52% predicted).  She was admitted to the hospital in late December with an apparent acute exacerbation of her COPD.  She had a CT scan of the chest done on 12/16 during that admission that showed an irregular 2.7 cm nodular focus in the posterior left upper lobe with some surrounding groundglass opacity, a spiculated 1.8 cm solid apical right upper lobe pulmonary nodule.  A subsequent PET scan was done on 07/30/2018 that I have reviewed.  This shows that both the right upper lobe spiculated nodule and the posterior left upper lobe irregular nodule remain, are hypermetabolic.  She is here today for further evaluation of the abnormalities on CT.   Current bronchodilator therapy is Anoro.  She uses albuterol approximately.  She is on Plavix and aspirin 81 mg. She was sent home from hospital with O2. She has been active, walks her dog. Able to do her ADL and shopping.   ROV 09/14/2018 --follow-up visit for patient with a history of tobacco, left-sided breast cancer (treated) RAS, hypothyroidism, COPD with severe obstruction on pulmonary function testing.  I took her for navigational bronchoscopy on 2/12 to better evaluate bilateral upper lobe pulmonary nodules that were hypermetabolic on PET scan.  Biopsies revealed squamous cell on the left and adenocarcinoma on the right.  Fiducials were placed around each.  I had her restart her Plavix a few days post procedure, but she has continued to have some hemoptysis so I asked her to stop it again on 2/21. She is having some blood from her nose, had to call EMS for this 2/21, but got it under  control. Has been worked on before by ENT.   She denies any significant SOB, can happen w heavier exertion. She is on Anoro - has been benefiting from it. She is smoking about   MRI brain from 09/08/2018 does not show any evidence of metastatic disease. She has an appt 09/21/18 with Dr [name].    ROV 02/02/2019 --[name] is 74 and has a history of severe COPD, treated left-sided breast cancer, bilateral primary lung cancers (squamous cell on the left, adenocarcinoma on the right) diagnosed by [facility].  She is being followed by Dr [name] and Dr Sondra Come and completed SBRT in April. Her epistaxis has resolved, remains off plavix. Next CT chest in August. She has had some fatigue. She has been sleeping with oxygen, doesn't use during the day (but has O2 left over from hospitalization). She is on Anoro, feels some benefit. She doesn't have albuterol.   ROV 08/02/19 --74 year old woman with severe COPD, treated left-sided breast cancer and bilateral primary lung cancers that were diagnosed by ENT.  Squamous cell on the left, adenocarcinoma on the right.  Treated with SBRT which she completed in November.  We have been managing her with Anoro. She is having cough with it, doesn't believe it is helping her breathing. Never needs albuterol.  Lots of congestion with clear mucus. On oxygen at 4 liters per minute at night. She was titrated to 2L/min with exertion at last visit here.   Most recent CT chest 06/11/2019 reviewed, shows improvement in her LUL and R sided  nodules, some radiation change.    Review of Systems As above      Objective:   Physical Exam Vitals:   08/02/19 1352  BP: 98/70  Pulse: 79  Temp: 98.3 F (36.8 C)  TempSrc: Temporal  SpO2: 91%  Weight: 91 lb 9.6 oz (41.5 kg)  Height: 5' (1.524 m)   Gen: Pleasant, thin, in no distress,  normal affect  ENT: No lesions,  mouth clear,  oropharynx clear,   Neck: No JVD, no stridor  Lungs: No use of accessory muscles, distant, no  wheezes  Cardiovascular: RRR, heart sounds normal, no murmur or gallops, no peripheral edema  Musculoskeletal: No deformities, no cyanosis or clubbing  Neuro: alert, awake, non focal  Skin: Warm, no lesions or rash      Assessment & Plan:  Chronic obstructive pulmonary disease (HCC) Temporarily stop Anoro. Start Stiolto 2 puffs once daily.  If you get more benefit from this medication we will continue it going forward. Keep your albuterol available to use 2 puffs if needed for shortness of breath, chest tightness, wheezing. We will write a note regarding your jury summons Follow in 1 month to discuss the Stiolto   Chronic respiratory failure with hypoxia ([place]) Please continue oxygen at 4 L/min at night while sleeping.  You also need to wear 2 L/min with heavy exertion using your portable tanks.   Squamous cell lung cancer, left (HCC) Continue to follow with Dr. Sondra Come to and get your CT scans of the chest as planned  Baltazar Apo, MD, PhD 08/02/2019, 2:31 PM [facility] [phone] or if no answer [phone]

## 2019-08-05 DIAGNOSIS — M25512 Pain in left shoulder: Secondary | ICD-10-CM | POA: Diagnosis not present

## 2019-08-18 ENCOUNTER — Other Ambulatory Visit: Payer: Self-pay | Admitting: Internal Medicine

## 2019-08-18 DIAGNOSIS — Z853 Personal history of malignant neoplasm of breast: Secondary | ICD-10-CM

## 2019-09-02 ENCOUNTER — Ambulatory Visit: Payer: Medicare Other | Admitting: Emergency Medicine

## 2019-09-05 ENCOUNTER — Emergency Department (HOSPITAL_COMMUNITY)
Admission: EM | Admit: 2019-09-05 | Discharge: 2019-09-05 | Disposition: A | Discharge status: Home / Self Care | Payer: Medicare Other | Attending: Emergency Medicine | Admitting: Emergency Medicine

## 2019-09-05 ENCOUNTER — Emergency Department (HOSPITAL_COMMUNITY): Payer: Medicare Other

## 2019-09-05 ENCOUNTER — Other Ambulatory Visit: Payer: Self-pay

## 2019-09-05 ENCOUNTER — Encounter (HOSPITAL_COMMUNITY): Payer: Self-pay

## 2019-09-05 DIAGNOSIS — R0602 Shortness of breath: Secondary | ICD-10-CM | POA: Diagnosis not present

## 2019-09-05 DIAGNOSIS — W08XXXA Fall from other furniture, initial encounter: Secondary | ICD-10-CM | POA: Insufficient documentation

## 2019-09-05 DIAGNOSIS — F17210 Nicotine dependence, cigarettes, uncomplicated: Secondary | ICD-10-CM | POA: Insufficient documentation

## 2019-09-05 DIAGNOSIS — S0083XA Contusion of other part of head, initial encounter: Secondary | ICD-10-CM | POA: Diagnosis not present

## 2019-09-05 DIAGNOSIS — S0993XA Unspecified injury of face, initial encounter: Secondary | ICD-10-CM | POA: Diagnosis present

## 2019-09-05 DIAGNOSIS — C3490 Malignant neoplasm of unspecified part of unspecified bronchus or lung: Secondary | ICD-10-CM | POA: Diagnosis not present

## 2019-09-05 DIAGNOSIS — Y92009 Unspecified place in unspecified non-institutional (private) residence as the place of occurrence of the external cause: Secondary | ICD-10-CM | POA: Diagnosis not present

## 2019-09-05 DIAGNOSIS — R52 Pain, unspecified: Secondary | ICD-10-CM | POA: Diagnosis not present

## 2019-09-05 DIAGNOSIS — S0990XA Unspecified injury of head, initial encounter: Secondary | ICD-10-CM | POA: Insufficient documentation

## 2019-09-05 DIAGNOSIS — Y9389 Activity, other specified: Secondary | ICD-10-CM | POA: Diagnosis not present

## 2019-09-05 DIAGNOSIS — I509 Heart failure, unspecified: Secondary | ICD-10-CM | POA: Diagnosis not present

## 2019-09-05 DIAGNOSIS — S20211A Contusion of right front wall of thorax, initial encounter: Secondary | ICD-10-CM | POA: Diagnosis not present

## 2019-09-05 DIAGNOSIS — S62356A Nondisplaced fracture of shaft of fifth metacarpal bone, right hand, initial encounter for closed fracture: Secondary | ICD-10-CM | POA: Diagnosis not present

## 2019-09-05 DIAGNOSIS — Y999 Unspecified external cause status: Secondary | ICD-10-CM | POA: Diagnosis not present

## 2019-09-05 DIAGNOSIS — S199XXA Unspecified injury of neck, initial encounter: Secondary | ICD-10-CM | POA: Diagnosis not present

## 2019-09-05 DIAGNOSIS — Z743 Need for continuous supervision: Secondary | ICD-10-CM | POA: Diagnosis not present

## 2019-09-05 DIAGNOSIS — R0781 Pleurodynia: Secondary | ICD-10-CM | POA: Diagnosis not present

## 2019-09-05 DIAGNOSIS — M79641 Pain in right hand: Secondary | ICD-10-CM | POA: Diagnosis not present

## 2019-09-05 DIAGNOSIS — S6991XA Unspecified injury of right wrist, hand and finger(s), initial encounter: Secondary | ICD-10-CM | POA: Diagnosis not present

## 2019-09-05 DIAGNOSIS — I110 Hypertensive heart disease with heart failure: Secondary | ICD-10-CM | POA: Diagnosis not present

## 2019-09-05 DIAGNOSIS — I10 Essential (primary) hypertension: Secondary | ICD-10-CM | POA: Diagnosis not present

## 2019-09-05 DIAGNOSIS — S299XXA Unspecified injury of thorax, initial encounter: Secondary | ICD-10-CM | POA: Diagnosis not present

## 2019-09-05 DIAGNOSIS — R0902 Hypoxemia: Secondary | ICD-10-CM | POA: Diagnosis not present

## 2019-09-05 DIAGNOSIS — W19XXXA Unspecified fall, initial encounter: Secondary | ICD-10-CM

## 2019-09-05 LAB — CBC WITH DIFFERENTIAL/PLATELET
Abs Immature Granulocytes: 0.03 10*3/uL (ref 0.00–0.07)
Basophils Absolute: 0 10*3/uL (ref 0.0–0.1)
Basophils Relative: 1 %
Eosinophils Absolute: 0.1 10*3/uL (ref 0.0–0.5)
Eosinophils Relative: 1 %
HCT: 36.1 % (ref 36.0–46.0)
Hemoglobin: 12 g/dL (ref 12.0–15.0)
Immature Granulocytes: 0 %
Lymphocytes Relative: 17 %
Lymphs Abs: 1.3 10*3/uL (ref 0.7–4.0)
MCH: 31.2 pg (ref 26.0–34.0)
MCHC: 33.2 g/dL (ref 30.0–36.0)
MCV: 93.8 fL (ref 80.0–100.0)
Monocytes Absolute: 0.6 10*3/uL (ref 0.1–1.0)
Monocytes Relative: 8 %
Neutro Abs: 5.5 10*3/uL (ref 1.7–7.7)
Neutrophils Relative %: 73 %
Platelets: 163 10*3/uL (ref 150–400)
RBC: 3.85 MIL/uL — ABNORMAL LOW (ref 3.87–5.11)
RDW: 15.5 % (ref 11.5–15.5)
WBC: 7.5 10*3/uL (ref 4.0–10.5)
nRBC: 0 % (ref 0.0–0.2)

## 2019-09-05 LAB — BASIC METABOLIC PANEL
Anion gap: 15 (ref 5–15)
BUN: 12 mg/dL (ref 8–23)
CO2: 23 mmol/L (ref 22–32)
Calcium: 9.1 mg/dL (ref 8.9–10.3)
Chloride: 95 mmol/L — ABNORMAL LOW (ref 98–111)
Creatinine, Ser: 0.69 mg/dL (ref 0.44–1.00)
GFR calc Af Amer: >60 mL/min (ref >60)
GFR calc non Af Amer: >60 mL/min (ref >60)
Glucose, Bld: 98 mg/dL (ref 70–99)
Potassium: 3.6 mmol/L (ref 3.5–5.1)
Sodium: 133 mmol/L — ABNORMAL LOW (ref 135–145)

## 2019-09-05 LAB — COOXEMETRY PANEL
Carboxyhemoglobin: 4.8 % — ABNORMAL HIGH (ref 0.5–1.5)
Methemoglobin: 1.2 % (ref 0.0–1.5)
O2 Saturation: 85 %
Total hemoglobin: 12 g/dL (ref 12.0–16.0)

## 2019-09-05 MED ORDER — ACETAMINOPHEN 500 MG PO TABS
1000.0000 mg | ORAL_TABLET | Freq: Once | ORAL | Status: AC
  Administered 2019-09-05: 1000 mg via ORAL
  Filled 2019-09-05: qty 2

## 2019-09-05 NOTE — ED Provider Notes (Signed)
[facility]ERGENCY DEPARTMENT Provider Note   CSN: [id number] Arrival date & time: 09/05/19  0103     History Chief Complaint  Patient presents with  . Fall    Kendra Watts is a 74 y.o. female with a hx of anemia, breast cancer, CHF, COPD, hypertension, lung cancer, chronic oxygen usage presents to the Emergency Department complaining of acute, persistent right-sided facial pain, right rib pain and right hand pain after fall off a stepstool.  Patient reports that her carbon monoxide alarm woke her up.  She thought that it needed a battery change therefore she got up on a stepstool to open the cover.  She reports when she went to close the cover she had to reach very far and lost her balance causing her fall.  She denies lightheadedness dizziness or syncope as the cause.  Patient reports when she fell she struck the right side of her face and body on the door frame.  She reports she was immediately ambulatory and has no back pain hip pain or leg pain.  She does report worsening pain in her face and swelling.  She states she is no longer anticoagulated.  She does wear oxygen 2 L via nasal cannula.  She denies fatigue, redness of her skin, shortness of breath, chest pain, syncope.  She reports she has been without power but was not using an alternative heat source.  The history is provided by the patient and medical records. No language interpreter was used.       Past Medical History:  Diagnosis Date  . Anemia   . 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 Breast cancer ([place]) 09/22/2015   Malignant  . Breast cancer of upper-outer quadrant of left female breast ([place]) 09/08/2015  . Cataract, immature   . CHF (congestive heart failure) ([place])    Acute CHF-06/2018  . COPD (chronic obstructive pulmonary disease) ([place])   . Heart murmur    states no known problems  . History of hyperthyroidism   . Hyperlipidemia   . Hypertension    states under control with meds.,  has been on med. x "long time"  . Hypokalemia    from last physical.   . Hypothyroidism   . Nonfunctioning kidney    left  . Personal history of radiation therapy    2017  . Pulmonary nodules    Bilateral  . Radiation 10/30/15-11/28/15   left breast 42.72 Gy, boosted to 10 Gy  . Renal artery stenosis ([place])   . Tobacco abuse   . Wears partial dentures    upper and lower    Patient Active Problem List   Diagnosis Date Noted  . Hemoptysis 09/14/2018  . Squamous cell lung cancer, left ([place]) 09/10/2018  . Adenocarcinoma, lung, right ([place]) 09/10/2018  . Multiple lung nodules 07/20/2018  . Chronic obstructive pulmonary disease ([place]) 07/20/2018  . Acute CHF (congestive heart failure) ([place]) 07/06/2018  . Chronic respiratory failure with hypoxia ([place]) 07/06/2018  . Hyponatremia 07/06/2018  . Anemia 07/06/2018  . Breast cancer of upper-outer quadrant of left female breast ([place]) 09/08/2015  . Renal artery stenosis ([place]) 08/23/2014  . Right upper extremity numbness 07/01/2013  . Atherosclerotic renal artery stenosis, bilateral ([place]) 05/26/2013  . Peripheral arterial disease ([place]) 05/26/2013  . Essential hypertension 05/26/2013  . Hyperlipidemia 05/26/2013  . Tobacco abuse 05/26/2013  . Carotid artery stenosis 03/09/2012    Past Surgical History:  Procedure Laterality Date  . ABDOMINAL  ANGIOGRAM  02/18/2012   Procedure: ABDOMINAL ANGIOGRAM;  Surgeon: Lorretta Harp, MD;  Location: [facility] CATH LAB;  Service: Cardiovascular;;  . ABDOMINAL AORTAGRAM  07/04/2014  . ABDOMINAL HYSTERECTOMY  ~ 1977   partial  . APPENDECTOMY    . ARCH AORTOGRAM    . BREAST LUMPECTOMY Left 09/22/2015   Malignant  . CAROTID ANGIOGRAM N/A 02/18/2012   Procedure: CAROTID ANGIOGRAM;  Surgeon: Lorretta Harp, MD;  Location: [facility] CATH LAB;  Service: Cardiovascular;  Laterality: N/A;  . ENDARTERECTOMY  04/02/2012   Procedure: ENDARTERECTOMY CAROTID;  Surgeon: Serafina Mitchell, MD;  Location: [facility] OR;  Service: Vascular;   Laterality: Right;  . FUDUCIAL PLACEMENT Bilateral 09/02/2018   Procedure: Placement Of Fiducial to right upper lobe & left upper lobe lung;  Surgeon: [name], MD;  Location: [facility] OR;  Service: Thoracic;  Laterality: Bilateral;  . IR THORACENTESIS ASP PLEURAL SPACE W/IMG GUIDE  07/08/2018  . RADIOACTIVE SEED GUIDED PARTIAL MASTECTOMY WITH AXILLARY SENTINEL LYMPH NODE BIOPSY Left 09/22/2015   Procedure: INJECT BLUE DYE LEFT BREAST,RADIOACTIVE SEED GUIDED PARTIAL MASTECTOMY WITH AXILLARY SENTINEL LYMPH NODE BIOPSY;  Surgeon: Fanny Skates, MD;  Location: [place] [place] [facility];  Service: General;  Laterality: Left;  . RENAL ANGIOGRAM Left 06/08/2010   renal artery stent -  5x12 Genesis on Aviator balloon stent (Dr. Adora Fridge)  . RENAL ANGIOGRAM Right 07/04/2014   Procedure: RENAL ANGIOGRAM;  Surgeon: Lorretta Harp, MD;  Location: [facility] CATH LAB;  Service: Cardiovascular;  Laterality: Right;  . RENAL ANGIOGRAM Right 08/22/2014   Procedure: RENAL ANGIOGRAM;  Surgeon: Lorretta Harp, MD;  Location[facility] CATH LAB;  Service: Cardiovascular;  Laterality: Right;  . TONSILLECTOMY     as a child  . VIDEO BRONCHOSCOPY WITH ENDOBRONCHIAL NAVIGATION N/A 09/02/2018   Procedure: VIDEO BRONCHOSCOPY WITH ENDOBRONCHIAL NAVIGATION;  Surgeon: [name], MD;  Location: [facility] OR;  Service: Thoracic;  Laterality: N/A;     OB History   No obstetric history on file.     Family History  Problem Relation Age of Onset  . Heart disease Mother        MI @ 27, died at 33  . Cancer Father   . Lung cancer Father   . Heart disease Maternal Grandmother   . Lung cancer Sister   . Breast cancer Paternal Aunt     Social History   Tobacco Use  . Smoking status: Current Every Day Smoker    Packs/day: 0.50    Years: 40.00    Pack years: 20.00    Types: Cigarettes  . Smokeless tobacco: Never Used  . Tobacco comment: as of 09/14/2018, less than 1 pack/day  Substance Use Topics  . Alcohol use: Yes    Comment:  occasionally  . Drug use: No    Home Medications Prior to Admission medications   Medication Sig Start Date End Date Taking? Authorizing Provider  albuterol (VENTOLIN HFA) 108 (90 Base) MCG/ACT inhaler Inhale 2 puffs into the lungs every 6 (six) hours as needed for wheezing or shortness of breath. 02/02/19   [name], MD  aspirin 81 MG tablet Take 81 mg by mouth daily.    [provider]  Calcium-Magnesium-Vitamin D (CALCIUM 1200+D3 PO) Take 1 tablet by mouth daily.    [provider]  doxazosin (CARDURA) 8 MG tablet Take 8 mg by mouth daily.     [provider]  furosemide (LASIX) 20 MG tablet Take 1 tablet (20 mg  total) by mouth daily. 07/13/18 08/02/19  Kayleen Memos, DO  levothyroxine (SYNTHROID, LEVOTHROID) 88 MCG tablet Take 88 mcg by mouth daily before breakfast.    [provider]  metoprolol succinate (TOPROL-XL) 25 MG 24 hr tablet Take 25 mg by mouth daily.    [provider]  Multiple Vitamins-Minerals (MULTIVITAMIN WITH MINERALS) tablet Take 1 tablet by mouth daily.     [provider]  Naphazoline HCl (CLEAR EYES OP) Place 2 drops into both eyes daily.    [provider]  pantoprazole (PROTONIX) 40 MG tablet Take 1 tablet (40 mg total) by mouth daily. 07/14/18   Kayleen Memos, DO  rosuvastatin (CRESTOR) 10 MG tablet Take 10 mg by mouth daily.     [provider]  tamoxifen (NOLVADEX) 20 MG tablet TAKE 1 TABLET BY MOUTH  DAILY 10/14/18   Nicholas Lose, MD  Tiotropium Bromide-Olodaterol (STIOLTO RESPIMAT) 2.5-2.5 MCG/ACT AERS Inhale 2 puffs into the lungs daily. 08/02/19   [name], MD  umeclidinium-vilanterol (ANORO ELLIPTA) 62.5-25 MCG/INH AEPB Inhale 1 puff into the lungs daily.    [provider]    Allergies    Patient has no known allergies.  Review of Systems   Review of Systems  Constitutional: Negative for appetite change, diaphoresis, fatigue, fever and unexpected weight  change.  HENT: Positive for facial swelling. Negative for mouth sores.   Eyes: Negative for visual disturbance.  Respiratory: Negative for cough, chest tightness, shortness of breath and wheezing.   Cardiovascular: Positive for chest pain ( right rib pain).  Gastrointestinal: Negative for abdominal pain, constipation, diarrhea, nausea and vomiting.  Endocrine: Negative for polydipsia, polyphagia and polyuria.  Genitourinary: Negative for dysuria, frequency, hematuria and urgency.  Musculoskeletal: Positive for arthralgias and myalgias. Negative for back pain and neck stiffness.  Skin: Negative for rash.  Allergic/Immunologic: Negative for immunocompromised state.  Neurological: Positive for headaches. Negative for syncope and light-headedness.  Hematological: Does not bruise/bleed easily.  Psychiatric/Behavioral: Negative for sleep disturbance. The patient is not nervous/anxious.     Physical Exam Updated Vital Signs BP (!) 157/93   Pulse 95   Temp 98.1 F (36.7 C)   Resp 16   SpO2 95%   Physical Exam Vitals and nursing note reviewed.  Constitutional:      General: She is not in acute distress.    Appearance: She is not diaphoretic.  HENT:     Head: Normocephalic.     Right Ear: Tympanic membrane normal. No hemotympanum.     Left Ear: Tympanic membrane normal. No hemotympanum.     Ears:     Comments: No Malocclusion, but mild trismus with pain to the right mandible with palpation and with jaw movement. No battle signs No [place] eyes No hemotympanum bilaterally    Nose:     Left Nostril: No epistaxis.     Mouth/Throat:     Mouth: Mucous membranes are moist.     Tongue: No lesions.     Pharynx: No pharyngeal swelling.  Eyes:     General: No scleral icterus.    Conjunctiva/sclera: Conjunctivae normal.  Neck:     Comments: No midline or paraspinal tenderness.  Full range of motion without pain. Cardiovascular:     Rate and Rhythm: Normal rate and regular rhythm.      Pulses: Normal pulses.          Radial pulses are 2+ on the right side and 2+ on the left side.  Pulmonary:  Effort: No tachypnea, accessory muscle usage, prolonged expiration, respiratory distress or retractions.     Breath sounds: Normal breath sounds. No stridor.     Comments: Equal chest rise. No increased work of breathing. Coarse but otherwise clear and equal breath sounds. Chest:     Chest wall: Tenderness ( Right ribs) present.     Comments: No ecchymosis, swelling or flail segment. Abdominal:     General: There is no distension.     Palpations: Abdomen is soft.     Tenderness: There is no abdominal tenderness. There is no guarding or rebound.     Comments: No ecchymosis or contusion.  Musculoskeletal:     Right shoulder: Normal.     Left shoulder: Normal.     Right elbow: Normal.     Left elbow: Normal.     Right wrist: Normal.     Left wrist: Normal.     Right hand: Swelling, tenderness and bony tenderness present. Normal strength. Normal sensation.     Left hand: Normal.     Cervical back: Normal and normal range of motion. No spinous process tenderness or muscular tenderness. Normal range of motion.     Thoracic back: Normal.     Lumbar back: Normal.     Right hip: Normal.     Left hip: Normal.     Right knee: Normal.     Left knee: Normal.     Right ankle: Normal.     Left ankle: Normal.     Comments: Moves all extremities equally and without difficulty.  Skin:    General: Skin is warm and dry.     Capillary Refill: Capillary refill takes less than 2 seconds.  Neurological:     Mental Status: She is alert.     GCS: GCS eye subscore is 4. GCS verbal subscore is 5. GCS motor subscore is 6.     Comments: Speech is clear and goal oriented. Sensation intact in the bilateral upper and lower extremities. Strength 5/5 grossly in the bilateral upper and lower extremities.  Psychiatric:        Mood and Affect: Mood normal.     ED Results / Procedures /  Treatments   Labs (all labs ordered are listed, but only abnormal results are displayed) Labs Reviewed  CBC WITH DIFFERENTIAL/PLATELET - Abnormal; Notable for the following components:      Result Value   RBC 3.85 (*)    All other components within normal limits  BASIC METABOLIC PANEL - Abnormal; Notable for the following components:   Sodium 133 (*)    Chloride 95 (*)    All other components within normal limits  COOXEMETRY PANEL - Abnormal; Notable for the following components:   Carboxyhemoglobin 4.8 (*)    All other components within normal limits     Radiology DG Ribs Unilateral W/Chest Right  Result Date: 09/05/2019 CLINICAL DATA:  Fall, pain EXAM: RIGHT RIBS AND CHEST - 3+ VIEW COMPARISON:  CT chest dated 09/10/2018 FINDINGS: Fiducial markers in the bilateral upper lobes. Scarring/radiation changes in the left upper lobe. Lungs are otherwise clear. No pleural effusion or pneumothorax. The heart is normal in size.  Thoracic aortic atherosclerosis. Status post left breast lumpectomy. No displaced right rib fracture is seen. IMPRESSION: Postprocedural changes in the lungs bilaterally. No evidence of acute cardiopulmonary disease. No displaced right rib fracture is seen. Electronically Signed   By: [name] Hy M.D.   On: 09/05/2019 02:48   CT Head Wo Contrast  Result Date: 09/05/2019 CLINICAL DATA:  Fall, right head injury EXAM: CT HEAD WITHOUT CONTRAST CT MAXILLOFACIAL WITHOUT CONTRAST CT CERVICAL SPINE WITHOUT CONTRAST TECHNIQUE: Multidetector CT imaging of the head, cervical spine, and maxillofacial structures were performed using the standard protocol without intravenous contrast. Multiplanar CT image reconstructions of the cervical spine and maxillofacial structures were also generated. COMPARISON:  MRI brain dated 09/08/2018. Partial comparison to CT chest dated 06/11/2019. FINDINGS: CT HEAD FINDINGS Brain: No evidence of acute infarction, hemorrhage, hydrocephalus,  extra-axial collection or mass lesion/mass effect. Mild subcortical white matter and periventricular small vessel ischemic changes. Vascular: Intracranial atherosclerosis. Skull: Normal. Negative for fracture or focal lesion. Other: Extracranial hematoma overlying the right temporoparietal region (series 3/image 17). CT MAXILLOFACIAL FINDINGS Osseous: No evidence of maxillofacial fracture. Mandible is intact. Bilateral mandibular condyles are well-seated in the TMJs. Orbits: The bilateral orbits, including the globes and retroconal soft tissues, are within normal limits. Sinuses: The visualized paranasal sinuses are essentially clear. The mastoid air cells are unopacified. Soft tissues: Right extracranial hematoma, as above. CT CERVICAL SPINE FINDINGS Alignment: Exaggerated mid cervical lordosis. Skull base and vertebrae: No acute fracture. No primary bone lesion or focal pathologic process. Incomplete posterior ring of C1, congenital. Soft tissues and spinal canal: No prevertebral fluid or swelling. No visible canal hematoma. Disc levels: Mild degenerative changes at C4-5. Spinal canal is patent. Upper chest: 8 x 13 mm medial right upper lobe nodule (series 5/image 70), previously 8 x 12 mm on prior CT chest, with adjacent fiducial markers. Paraseptal emphysematous changes. Other: Visualized thyroid is unremarkable. IMPRESSION: Extracranial hematoma overlying the right temporoparietal region. No evidence of calvarial fracture. No evidence of acute intracranial abnormality. Small vessel ischemic changes. No evidence of maxillofacial fracture. No evidence of traumatic injury to the cervical spine. Mild degenerative changes of the mid cervical spine. Stable medial right upper lobe nodule with adjacent fiducial markers in this patient with history of lung cancer. Electronically Signed   By: [name] Hy M.D.   On: 09/05/2019 02:28   CT Cervical Spine Wo Contrast  Result Date: 09/05/2019 CLINICAL DATA:  Fall,  right head injury EXAM: CT HEAD WITHOUT CONTRAST CT MAXILLOFACIAL WITHOUT CONTRAST CT CERVICAL SPINE WITHOUT CONTRAST TECHNIQUE: Multidetector CT imaging of the head, cervical spine, and maxillofacial structures were performed using the standard protocol without intravenous contrast. Multiplanar CT image reconstructions of the cervical spine and maxillofacial structures were also generated. COMPARISON:  MRI brain dated 09/08/2018. Partial comparison to CT chest dated 06/11/2019. FINDINGS: CT HEAD FINDINGS Brain: No evidence of acute infarction, hemorrhage, hydrocephalus, extra-axial collection or mass lesion/mass effect. Mild subcortical white matter and periventricular small vessel ischemic changes. Vascular: Intracranial atherosclerosis. Skull: Normal. Negative for fracture or focal lesion. Other: Extracranial hematoma overlying the right temporoparietal region (series 3/image 17). CT MAXILLOFACIAL FINDINGS Osseous: No evidence of maxillofacial fracture. Mandible is intact. Bilateral mandibular condyles are well-seated in the TMJs. Orbits: The bilateral orbits, including the globes and retroconal soft tissues, are within normal limits. Sinuses: The visualized paranasal sinuses are essentially clear. The mastoid air cells are unopacified. Soft tissues: Right extracranial hematoma, as above. CT CERVICAL SPINE FINDINGS Alignment: Exaggerated mid cervical lordosis. Skull base and vertebrae: No acute fracture. No primary bone lesion or focal pathologic process. Incomplete posterior ring of C1, congenital. Soft tissues and spinal canal: No prevertebral fluid or swelling. No visible canal hematoma. Disc levels: Mild degenerative changes at C4-5. Spinal canal is patent. Upper chest: 8 x 13 mm medial right upper lobe nodule (series  5/image 70), previously 8 x 12 mm on prior CT chest, with adjacent fiducial markers. Paraseptal emphysematous changes. Other: Visualized thyroid is unremarkable. IMPRESSION: Extracranial  hematoma overlying the right temporoparietal region. No evidence of calvarial fracture. No evidence of acute intracranial abnormality. Small vessel ischemic changes. No evidence of maxillofacial fracture. No evidence of traumatic injury to the cervical spine. Mild degenerative changes of the mid cervical spine. Stable medial right upper lobe nodule with adjacent fiducial markers in this patient with history of lung cancer. Electronically Signed   By: [name] Hy M.D.   On: 09/05/2019 02:28   DG Hand Complete Right  Result Date: 09/05/2019 CLINICAL DATA:  Fall, pain EXAM: RIGHT HAND - COMPLETE 3+ VIEW COMPARISON:  None. FINDINGS: Mild cortical irregularity along the distal 5th metacarpal shaft. This appearance favors an exostosis/deformity related to prior trauma rather than an acute finding, although acute traumatic deformity is difficult to exclude. The joint spaces are preserved. Mild dorsal/medial soft tissue swelling. IMPRESSION: Mild irregularity along the distal 5th metacarpal shaft, favoring sequela of prior trauma. However, correlate for point tenderness to exclude an acute fracture in this location. Electronically Signed   By: [name] Hy M.D.   On: 09/05/2019 02:51   CT Maxillofacial Wo Contrast  Result Date: 09/05/2019 CLINICAL DATA:  Fall, right head injury EXAM: CT HEAD WITHOUT CONTRAST CT MAXILLOFACIAL WITHOUT CONTRAST CT CERVICAL SPINE WITHOUT CONTRAST TECHNIQUE: Multidetector CT imaging of the head, cervical spine, and maxillofacial structures were performed using the standard protocol without intravenous contrast. Multiplanar CT image reconstructions of the cervical spine and maxillofacial structures were also generated. COMPARISON:  MRI brain dated 09/08/2018. Partial comparison to CT chest dated 06/11/2019. FINDINGS: CT HEAD FINDINGS Brain: No evidence of acute infarction, hemorrhage, hydrocephalus, extra-axial collection or mass lesion/mass effect. Mild subcortical white  matter and periventricular small vessel ischemic changes. Vascular: Intracranial atherosclerosis. Skull: Normal. Negative for fracture or focal lesion. Other: Extracranial hematoma overlying the right temporoparietal region (series 3/image 17). CT MAXILLOFACIAL FINDINGS Osseous: No evidence of maxillofacial fracture. Mandible is intact. Bilateral mandibular condyles are well-seated in the TMJs. Orbits: The bilateral orbits, including the globes and retroconal soft tissues, are within normal limits. Sinuses: The visualized paranasal sinuses are essentially clear. The mastoid air cells are unopacified. Soft tissues: Right extracranial hematoma, as above. CT CERVICAL SPINE FINDINGS Alignment: Exaggerated mid cervical lordosis. Skull base and vertebrae: No acute fracture. No primary bone lesion or focal pathologic process. Incomplete posterior ring of C1, congenital. Soft tissues and spinal canal: No prevertebral fluid or swelling. No visible canal hematoma. Disc levels: Mild degenerative changes at C4-5. Spinal canal is patent. Upper chest: 8 x 13 mm medial right upper lobe nodule (series 5/image 70), previously 8 x 12 mm on prior CT chest, with adjacent fiducial markers. Paraseptal emphysematous changes. Other: Visualized thyroid is unremarkable. IMPRESSION: Extracranial hematoma overlying the right temporoparietal region. No evidence of calvarial fracture. No evidence of acute intracranial abnormality. Small vessel ischemic changes. No evidence of maxillofacial fracture. No evidence of traumatic injury to the cervical spine. Mild degenerative changes of the mid cervical spine. Stable medial right upper lobe nodule with adjacent fiducial markers in this patient with history of lung cancer. Electronically Signed   By: [name] Hy M.D.   On: 09/05/2019 02:28    Procedures .Splint Application  Date/Time: 09/05/2019 3:52 AM Performed by: [name], PA-C Authorized by: [name], PA-C    Consent:    Consent obtained:  Verbal   Consent given by:  Patient   Risks discussed:  Discoloration, numbness, pain and swelling   Alternatives discussed:  No treatment Pre-procedure details:    Sensation:  Normal   Skin color:  Pink Procedure details:    Laterality:  Right   Location:  Hand   Hand:  R hand   Cast type:  Short arm   Splint type:  Ulnar gutter   Supplies:  Ortho-Glass Post-procedure details:    Pain:  Unchanged   Sensation:  Normal   Skin color:  Pink   Patient tolerance of procedure:  Tolerated well, no immediate complications   (including critical care time)  Medications Ordered in ED Medications  acetaminophen (TYLENOL) tablet 1,000 mg (1,000 mg Oral Given 09/05/19 0130)    ED Course  I have reviewed the triage vital signs and the nursing notes.  Pertinent labs & imaging results that were available during my care of the patient were reviewed by me and considered in my medical decision making (see chart for details).  Clinical Course as of Sep 05 423  Sun Sep 05, 2019  0353 Patient is a smoker with COPD.  This is likely baseline.  Carboxyhemoglobin(!): 4.8 [HM]    Clinical Course User Index [HM] Thermon Zulauf, Gwenlyn Perking   MDM Rules/Calculators/A&P                       Presents after mechanical fall from step stool after her CO detector alarmed.  She denies alternate heat sources a possibility of CO poisoning.  Pt is a smoker with a hx of COPD. Coox panel pending.  Will obtain imaging of her head, face, neck, ribs and hand.  She is alert and oriented and well-appearing.  Requesting Tylenol for pain.  3:53 AM CT scans of her head, face and neck are without acute abnormality or fracture.  Pain is secondary to contusion.  Right ribs are without evidence of fracture personally evaluated these images.  Right hand with questionable fracture.  Given significant ecchymosis and point tenderness I do suspect this is a fracture.  Will place in ulnar  gutter splint and refer to hand.  Pain controlled with Tylenol.  The patient was discussed with and seen by Dr. Betsey Holiday who agrees with the treatment plan.   Final Clinical Impression(s) / ED Diagnoses Final diagnoses:  Fall, initial encounter  Contusion of face, initial encounter  Closed nondisplaced fracture of shaft of fifth metacarpal bone of right hand, initial encounter  Rib contusion, right, initial encounter    Rx / DC Orders ED Discharge Orders    None       Feliberto Stockley, Gwenlyn Perking 09/05/19 0426    Orpah Greek, MD 09/05/19 [phone]

## 2019-09-05 NOTE — Discharge Instructions (Addendum)
1. Medications: Tylenol for pain control, usual home medications 2. Treatment: rest, ice, elevate and use brace, drink plenty of fluids, gentle stretching 3. Follow Up: Please followup with orthopedics in 1 week for further evaluation of your right hand; Please return to the ER for worsening symptoms, worsening pain or other concerns

## 2019-09-05 NOTE — Progress Notes (Signed)
Orthopedic Tech Progress Note Patient Details:  CAYTLIN BETTER Dec 24, 1945 [id number]  Ortho Devices Type of Ortho Device: Arm sling, Ulna gutter splint Ortho Device/Splint Location: rue. left fingers unbent as splint is just for support and protection. Ortho Device/Splint Interventions: Ordered, Adjustment, Application   Post Interventions Patient Tolerated: Well Instructions Provided: Care of device, Adjustment of device   Karolee Stamps 09/05/2019, 3:54 AM

## 2019-09-05 NOTE — ED Triage Notes (Signed)
Pt bib [facility] from after a fall. Pt was reaching for smoke detector when she lost her balance and fell and hit her head on there door frame. Pt AOx4, neuro intact, no blood thinners, no loc. Pt wears PRN O2 at home at 2 lpm. EMS VSS.

## 2019-09-08 DIAGNOSIS — M79641 Pain in right hand: Secondary | ICD-10-CM | POA: Diagnosis not present

## 2019-09-08 DIAGNOSIS — S62306A Unspecified fracture of fifth metacarpal bone, right hand, initial encounter for closed fracture: Secondary | ICD-10-CM | POA: Diagnosis not present

## 2019-09-09 ENCOUNTER — Ambulatory Visit (INDEPENDENT_AMBULATORY_CARE_PROVIDER_SITE_OTHER): Payer: Medicare Other | Admitting: Emergency Medicine

## 2019-09-09 ENCOUNTER — Encounter: Payer: Self-pay | Admitting: Emergency Medicine

## 2019-09-09 ENCOUNTER — Other Ambulatory Visit: Payer: Self-pay | Admitting: Hematology and Oncology

## 2019-09-09 DIAGNOSIS — J449 Chronic obstructive pulmonary disease, unspecified: Secondary | ICD-10-CM | POA: Diagnosis not present

## 2019-09-09 DIAGNOSIS — Z853 Personal history of malignant neoplasm of breast: Secondary | ICD-10-CM

## 2019-09-09 NOTE — Progress Notes (Signed)
Virtual Visit via Telephone Note  I connected with Kendra Watts on 09/09/19 at  2:45 PM EST by telephone and verified that I am speaking with the correct person using two identifiers.  Location: Patient: Home Provider: Home Office   I discussed the limitations, risks, security and privacy concerns of performing an evaluation and management service by telephone and the availability of in person appointments. I also discussed with the patient that there may be a patient responsible charge related to this service. The patient expressed understanding and agreed to proceed.   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  She has hypoxemic respiratory failure, uses 2 L/min with exertion, 4 L/min at night.   Observations/Objective: Approximately 1 month ago she was seen and we stopped Anoro, started [employer].  She reports today that she fell recently and broke her right hand. She has not been taking the Stiolto yet because she got confused about the dosing. She has stayed on the [facility] - still coughs with it.  She never uses ventolin, does have it available.    COVID-19 Vaccine Information can be found at: ShippingScam.co.uk For questions related to vaccine distribution or appointments, please email vaccine@[employer] .com or call 856-289-0853.    Assessment and Plan: COPD.  We talked about changing to [facility] but she was confused about the dosing.  I discussed the appropriate regimen with her today.  Also reviewed the technique for administration.  She is going to stop Anoro now, start Stiolto 2 puffs once daily.  I will have her follow-up with [place] in about 1 month in the office so that we can confirm that she is doing the inhaler correctly.  Cough.  Hopefully changing the Anoro to an  alternative will be helpful.  She will continue Protonix as ordered  History of non-small cell lung cancer.  Following with radiation oncology.  Imaging as per their plans.  Follow Up Instructions: Follow up in 1 month to confirm adequate inhaler technique with the Stiolto.   I discussed the assessment and treatment plan with the patient. The patient was provided an opportunity to ask questions and all were answered. The patient agreed with the plan and demonstrated an understanding of the instructions.   The patient was advised to call back or seek an in-person evaluation if the symptoms worsen or if the condition fails to improve as anticipated.  I provided 21 minutes of non-face-to-face time during this encounter.   [name], MD

## 2019-09-13 ENCOUNTER — Encounter (HOSPITAL_COMMUNITY): Payer: Self-pay | Admitting: Cardiology

## 2019-10-05 DIAGNOSIS — M546 Pain in thoracic spine: Secondary | ICD-10-CM | POA: Diagnosis not present

## 2019-10-07 ENCOUNTER — Ambulatory Visit (HOSPITAL_COMMUNITY): Payer: Medicare Other

## 2019-10-07 DIAGNOSIS — S62306D Unspecified fracture of fifth metacarpal bone, right hand, subsequent encounter for fracture with routine healing: Secondary | ICD-10-CM | POA: Diagnosis not present

## 2019-10-07 DIAGNOSIS — M79641 Pain in right hand: Secondary | ICD-10-CM | POA: Diagnosis not present

## 2019-10-11 ENCOUNTER — Ambulatory Visit: Payer: Medicare Other | Admitting: Emergency Medicine

## 2019-10-11 ENCOUNTER — Encounter: Payer: Self-pay | Admitting: Emergency Medicine

## 2019-10-11 DIAGNOSIS — M546 Pain in thoracic spine: Secondary | ICD-10-CM | POA: Diagnosis not present

## 2019-10-15 ENCOUNTER — Telehealth: Payer: Self-pay

## 2019-10-15 ENCOUNTER — Encounter (HOSPITAL_COMMUNITY): Payer: Medicare Other

## 2019-10-15 ENCOUNTER — Ambulatory Visit (HOSPITAL_COMMUNITY)
Admission: RE | Admit: 2019-10-15 | Payer: Medicare Other | Source: Ambulatory Visit | Attending: Cardiovascular Disease | Admitting: Cardiovascular Disease

## 2019-10-15 DIAGNOSIS — M4854XD Collapsed vertebra, not elsewhere classified, thoracic region, subsequent encounter for fracture with routine healing: Secondary | ICD-10-CM | POA: Diagnosis not present

## 2019-10-15 DIAGNOSIS — M546 Pain in thoracic spine: Secondary | ICD-10-CM | POA: Diagnosis not present

## 2019-10-15 NOTE — Telephone Encounter (Signed)
Spoke with patient states she had a fall and an MRI was done. The physician that she saw at [facility] saw a "spot" and wanted her to follow up with oncology. I asked her to have the MRI sent to [place]. Left Suanne Marker a message to schedule her for a follow up when we receive the MRI

## 2019-10-18 ENCOUNTER — Telehealth: Payer: Self-pay | Admitting: Emergency Medicine

## 2019-10-18 NOTE — Telephone Encounter (Signed)
Called and spoke to Kendra Watts. Kendra Watts states she had an Thoracic Spine MRi due to a T3 compression fracture. Kendra Watts states on the MRI report there was a 1.5 cm x 0.6 cm RUL lung nodule noted and suggested follow up CT chest. Kendra Watts's ortho provider also wanted her to follow up with pulmonary to discuss the results. Kendra Watts does not have the disc but will obtain it if Dr. Lamonte Sakai would like to see disc. Kendra Watts has a televisit on 10/27/19 with RB.   Dr. Lamonte Sakai please advise.

## 2019-10-19 NOTE — Telephone Encounter (Signed)
She has a CT chest ordered through Dr Sondra Come, should be for May 2021, to follow her pulm nodules. It would probably be helpful for her to get a copy of the MRI so we can use it w comparison. Main thing is for her to get her repeat CT as planned.

## 2019-10-20 NOTE — Telephone Encounter (Signed)
Called and spoke to pt. Informed her of the recs per Dr. Lamonte Sakai. Pt verbalized understanding and states she will obtain the MRI and have it mailed to our office. Will forward to [place] as [place]. Will sign off on note as this may take several days.

## 2019-10-22 DIAGNOSIS — Z23 Encounter for immunization: Secondary | ICD-10-CM | POA: Diagnosis not present

## 2019-10-25 ENCOUNTER — Telehealth: Payer: Self-pay | Admitting: Emergency Medicine

## 2019-10-25 NOTE — Telephone Encounter (Signed)
Disc has been received and placed in RB's look at folder.

## 2019-10-25 NOTE — Telephone Encounter (Signed)
Dr. Lamonte Sakai please look out for CD.

## 2019-10-26 NOTE — Telephone Encounter (Signed)
Thank you :)

## 2019-10-27 ENCOUNTER — Encounter: Payer: Self-pay | Admitting: Emergency Medicine

## 2019-10-27 ENCOUNTER — Other Ambulatory Visit: Payer: Self-pay

## 2019-10-27 ENCOUNTER — Telehealth: Payer: Self-pay | Admitting: Emergency Medicine

## 2019-10-27 ENCOUNTER — Ambulatory Visit (INDEPENDENT_AMBULATORY_CARE_PROVIDER_SITE_OTHER): Payer: Medicare Other | Admitting: Emergency Medicine

## 2019-10-27 DIAGNOSIS — R911 Solitary pulmonary nodule: Secondary | ICD-10-CM | POA: Diagnosis not present

## 2019-10-27 DIAGNOSIS — R918 Other nonspecific abnormal finding of lung field: Secondary | ICD-10-CM | POA: Diagnosis not present

## 2019-10-27 MED ORDER — STIOLTO RESPIMAT 2.5-2.5 MCG/ACT IN AERS: 2.0000 | INHALATION_SPRAY | Freq: Every day | RESPIRATORY_TRACT | 12 refills | Status: DC

## 2019-10-27 NOTE — Progress Notes (Signed)
Virtual Visit via Telephone Note  I connected with Kendra Watts on 10/27/19 at 10:00 AM EDT by telephone and verified that I am speaking with the correct person using two identifiers.  Location: Patient: Home Provider: Office   I discussed the limitations, risks, security and privacy concerns of performing an evaluation and management service by telephone and the availability of in person appointments. I also discussed with the patient that there may be a patient responsible charge related to this service. The patient expressed understanding and agreed to proceed.   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  She has hypoxemic respiratory failure on 2 to 4 L/min.  She underwent a thoracic MRI in March that showed her pulmonary nodular disease/scar.     Observations/Objective: Most recent CT chest was done 06/11/2019 which I reviewed, showed no lymphadenopathy, 8 x 12 mm medial right upper lobe nodule, 10 mm left upper lobe nodule (smaller), 6 mm chelated right lower lobe nodule (unchanged). The MRI spine identified 1.5x0.6cm nodule in the posterior RUL. I reviewed the images.  Not clear to me that this is one of the previously identified nodules.  She ran out of Stiolto, had some Anoro left over and is using   Assessment and Plan: Plan to continue Stiolto 2 puffs qd, new script sent today   Unclear to me that the pulmonary nodule seen on her MRI spine is one of the same ones that we have been dealing with.  I think she needs a repeat CT chest to compare with November, determine whether there may be new nodular disease.  If so then we will have to talk to Dr. Sondra Come about any role for empiric radiation, diagnostics.  Follow Up Instructions: Next available after CT to review   I  discussed the assessment and treatment plan with the patient. The patient was provided an opportunity to ask questions and all were answered. The patient agreed with the plan and demonstrated an understanding of the instructions.   The patient was advised to call back or seek an in-person evaluation if the symptoms worsen or if the condition fails to improve as anticipated.  I provided 12 minutes of non-face-to-face time during this encounter.   [name], MD

## 2019-10-27 NOTE — Telephone Encounter (Signed)
Spoke to patient & gave her the CT appt ibnfo of 4/22@4 :30@ [facility].  Provided their phone number to r/s if needed.  Pt verbalized understanding & stated nothing further needed at this time.

## 2019-10-28 ENCOUNTER — Ambulatory Visit
Admission: RE | Admit: 2019-10-28 | Discharge: 2019-10-28 | Disposition: A | Discharge status: Home / Self Care | Payer: Medicare Other | Source: Ambulatory Visit | Attending: Hematology and Oncology | Admitting: Hematology and Oncology

## 2019-10-28 ENCOUNTER — Other Ambulatory Visit: Payer: Self-pay

## 2019-10-28 DIAGNOSIS — R922 Inconclusive mammogram: Secondary | ICD-10-CM | POA: Diagnosis not present

## 2019-10-28 DIAGNOSIS — Z853 Personal history of malignant neoplasm of breast: Secondary | ICD-10-CM

## 2019-11-01 DIAGNOSIS — N390 Urinary tract infection, site not specified: Secondary | ICD-10-CM | POA: Diagnosis not present

## 2019-11-01 DIAGNOSIS — E782 Mixed hyperlipidemia: Secondary | ICD-10-CM | POA: Diagnosis not present

## 2019-11-01 DIAGNOSIS — I739 Peripheral vascular disease, unspecified: Secondary | ICD-10-CM | POA: Diagnosis not present

## 2019-11-01 DIAGNOSIS — I129 Hypertensive chronic kidney disease with stage 1 through stage 4 chronic kidney disease, or unspecified chronic kidney disease: Secondary | ICD-10-CM | POA: Diagnosis not present

## 2019-11-01 DIAGNOSIS — I10 Essential (primary) hypertension: Secondary | ICD-10-CM | POA: Diagnosis not present

## 2019-11-01 DIAGNOSIS — E46 Unspecified protein-calorie malnutrition: Secondary | ICD-10-CM | POA: Diagnosis not present

## 2019-11-02 ENCOUNTER — Ambulatory Visit (HOSPITAL_COMMUNITY)
Admission: RE | Admit: 2019-11-02 | Discharge: 2019-11-02 | Disposition: A | Discharge status: Home / Self Care | Payer: Medicare Other | Source: Ambulatory Visit | Attending: Cardiovascular Disease | Admitting: Cardiovascular Disease

## 2019-11-02 ENCOUNTER — Other Ambulatory Visit: Payer: Self-pay

## 2019-11-02 DIAGNOSIS — Z9889 Other specified postprocedural states: Secondary | ICD-10-CM | POA: Insufficient documentation

## 2019-11-02 DIAGNOSIS — I701 Atherosclerosis of renal artery: Secondary | ICD-10-CM

## 2019-11-03 ENCOUNTER — Telehealth: Payer: Self-pay

## 2019-11-03 NOTE — Telephone Encounter (Signed)
Spoke to patient renal doppler results given.Stated she was concerned about new lesion in abd aorta.Message sent to [facility] he advised if not having any worsening claudication repeat in 12 months.Stated she is not having any pain.Advised to repeat in 12 months.She will call sooner if needed.

## 2019-11-04 DIAGNOSIS — E890 Postprocedural hypothyroidism: Secondary | ICD-10-CM | POA: Diagnosis not present

## 2019-11-04 DIAGNOSIS — I129 Hypertensive chronic kidney disease with stage 1 through stage 4 chronic kidney disease, or unspecified chronic kidney disease: Secondary | ICD-10-CM | POA: Diagnosis not present

## 2019-11-04 DIAGNOSIS — J449 Chronic obstructive pulmonary disease, unspecified: Secondary | ICD-10-CM | POA: Diagnosis not present

## 2019-11-04 DIAGNOSIS — C3490 Malignant neoplasm of unspecified part of unspecified bronchus or lung: Secondary | ICD-10-CM | POA: Diagnosis not present

## 2019-11-04 DIAGNOSIS — E782 Mixed hyperlipidemia: Secondary | ICD-10-CM | POA: Diagnosis not present

## 2019-11-04 DIAGNOSIS — C50412 Malignant neoplasm of upper-outer quadrant of left female breast: Secondary | ICD-10-CM | POA: Diagnosis not present

## 2019-11-04 DIAGNOSIS — E46 Unspecified protein-calorie malnutrition: Secondary | ICD-10-CM | POA: Diagnosis not present

## 2019-11-04 DIAGNOSIS — I739 Peripheral vascular disease, unspecified: Secondary | ICD-10-CM | POA: Diagnosis not present

## 2019-11-04 DIAGNOSIS — I700 Atherosclerosis of aorta: Secondary | ICD-10-CM | POA: Diagnosis not present

## 2019-11-04 DIAGNOSIS — Z0000 Encounter for general adult medical examination without abnormal findings: Secondary | ICD-10-CM | POA: Diagnosis not present

## 2019-11-11 ENCOUNTER — Other Ambulatory Visit: Payer: Medicare Other

## 2019-11-11 DIAGNOSIS — H43813 Vitreous degeneration, bilateral: Secondary | ICD-10-CM | POA: Diagnosis not present

## 2019-11-11 DIAGNOSIS — H52203 Unspecified astigmatism, bilateral: Secondary | ICD-10-CM | POA: Diagnosis not present

## 2019-11-11 DIAGNOSIS — H04123 Dry eye syndrome of bilateral lacrimal glands: Secondary | ICD-10-CM | POA: Diagnosis not present

## 2019-11-11 DIAGNOSIS — H25813 Combined forms of age-related cataract, bilateral: Secondary | ICD-10-CM | POA: Diagnosis not present

## 2019-11-12 DIAGNOSIS — Z23 Encounter for immunization: Secondary | ICD-10-CM | POA: Diagnosis not present

## 2019-11-15 ENCOUNTER — Other Ambulatory Visit: Payer: Self-pay

## 2019-11-15 ENCOUNTER — Ambulatory Visit
Admission: RE | Admit: 2019-11-15 | Discharge: 2019-11-15 | Disposition: A | Discharge status: Home / Self Care | Payer: Medicare Other | Source: Ambulatory Visit | Attending: Emergency Medicine | Admitting: Emergency Medicine

## 2019-11-15 DIAGNOSIS — R911 Solitary pulmonary nodule: Secondary | ICD-10-CM

## 2019-11-15 DIAGNOSIS — R918 Other nonspecific abnormal finding of lung field: Secondary | ICD-10-CM | POA: Diagnosis not present

## 2019-11-15 DIAGNOSIS — Z853 Personal history of malignant neoplasm of breast: Secondary | ICD-10-CM | POA: Diagnosis not present

## 2019-11-19 DIAGNOSIS — M4854XD Collapsed vertebra, not elsewhere classified, thoracic region, subsequent encounter for fracture with routine healing: Secondary | ICD-10-CM | POA: Diagnosis not present

## 2019-11-30 ENCOUNTER — Telehealth: Payer: Self-pay | Admitting: Emergency Medicine

## 2019-11-30 NOTE — Telephone Encounter (Signed)
Dr. Lamonte Sakai please advise on CT results.

## 2019-11-30 NOTE — Telephone Encounter (Signed)
I spoke with the patient.  Her pulmonary nodules are stable in size and appearance except for a 1 cm nodule in the right lower lobe which is slightly bigger.  This will need to be followed, likely in 6 months with a repeat CT.  If it shows persistent growth then will need to discuss with Dr. Sondra Come > either empiric XRT or discuss risks / benefits of diagnostic procedure.   Will forward FYI to Dr Sondra Come. She has an appt with him in June.

## 2019-12-08 DIAGNOSIS — F17210 Nicotine dependence, cigarettes, uncomplicated: Secondary | ICD-10-CM | POA: Diagnosis not present

## 2019-12-08 DIAGNOSIS — E890 Postprocedural hypothyroidism: Secondary | ICD-10-CM | POA: Diagnosis not present

## 2019-12-08 DIAGNOSIS — Z8639 Personal history of other endocrine, nutritional and metabolic disease: Secondary | ICD-10-CM | POA: Diagnosis not present

## 2019-12-08 DIAGNOSIS — Z681 Body mass index (BMI) 19 or less, adult: Secondary | ICD-10-CM | POA: Diagnosis not present

## 2019-12-08 DIAGNOSIS — J449 Chronic obstructive pulmonary disease, unspecified: Secondary | ICD-10-CM | POA: Diagnosis not present

## 2019-12-14 DIAGNOSIS — M4854XD Collapsed vertebra, not elsewhere classified, thoracic region, subsequent encounter for fracture with routine healing: Secondary | ICD-10-CM | POA: Diagnosis not present

## 2019-12-17 IMAGING — MG DIGITAL DIAGNOSTIC BILATERAL MAMMOGRAM WITH TOMO AND CAD
9 series · 9 of 25 positions shown · non-contrast
Comparison: Previous exam(s).

CLINICAL DATA: Annual exam. History of left breast cancer diagnosed
in 6483. Presents for routine exam.

EXAM:
DIGITAL DIAGNOSTIC BILATERAL MAMMOGRAM WITH CAD AND TOMO

[L CC]
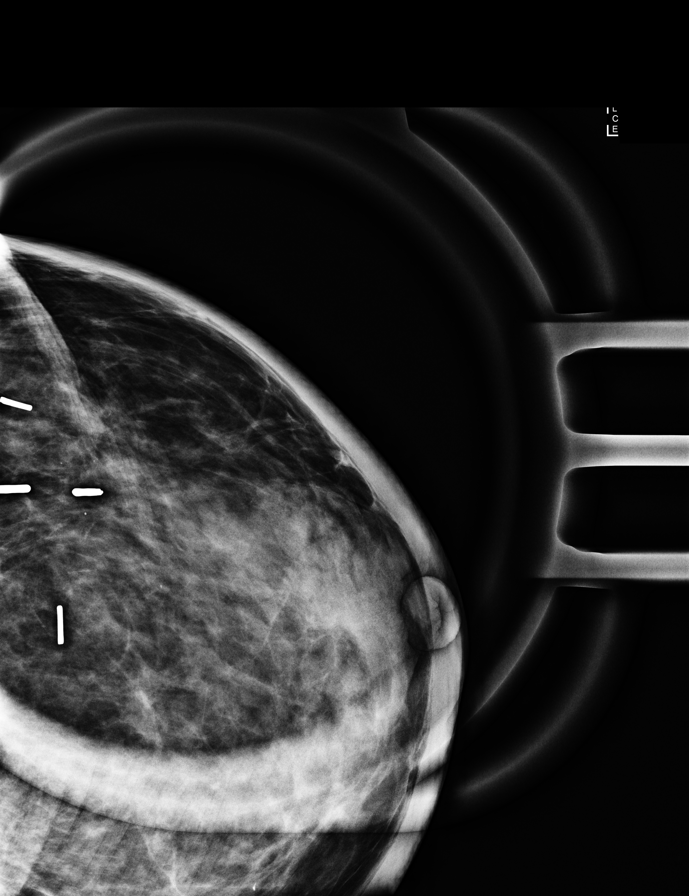

[L MLO synth-2D]
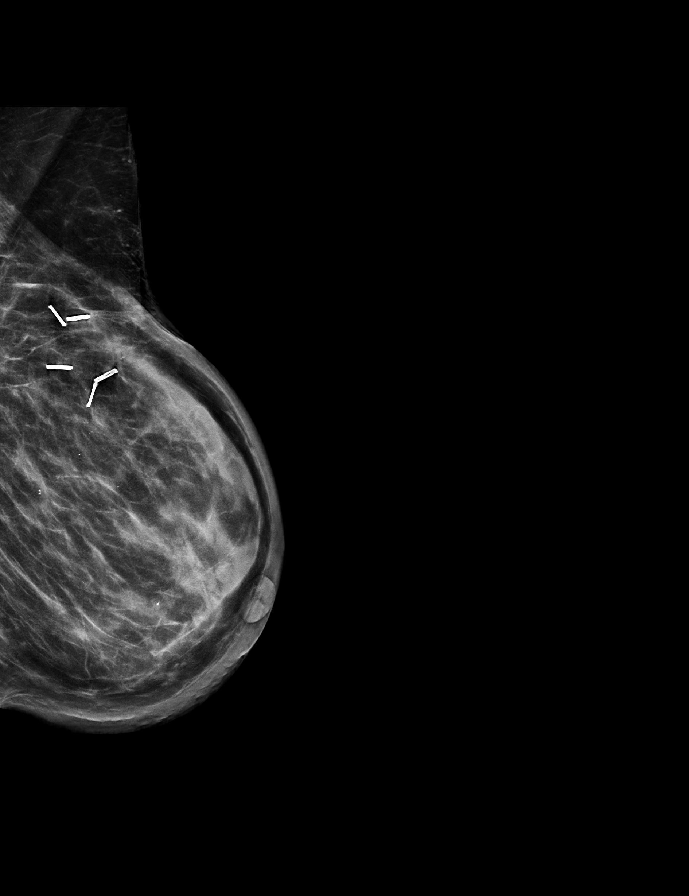

[L CC synth-2D]
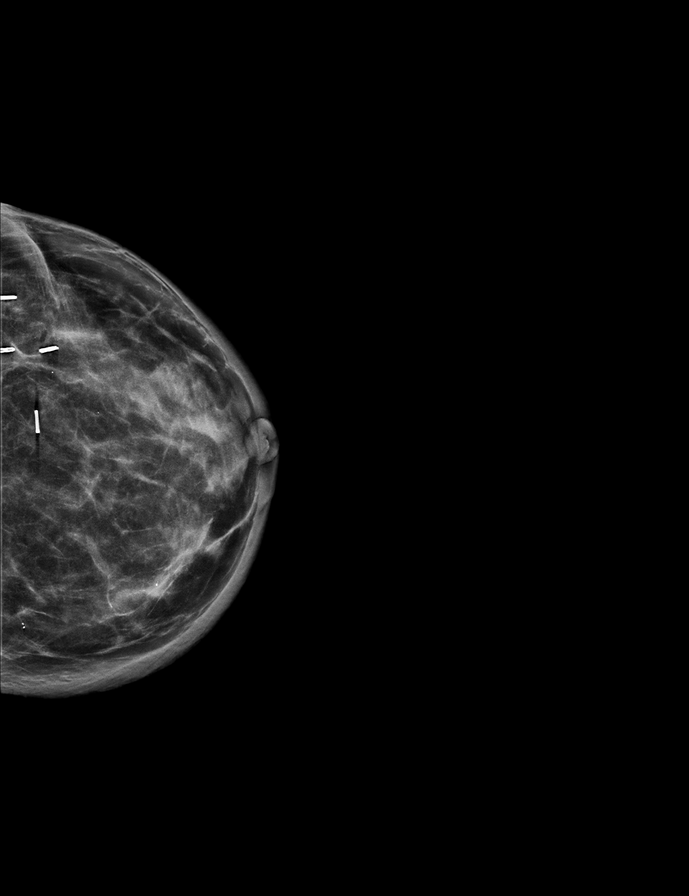

[R MLO synth-2D]
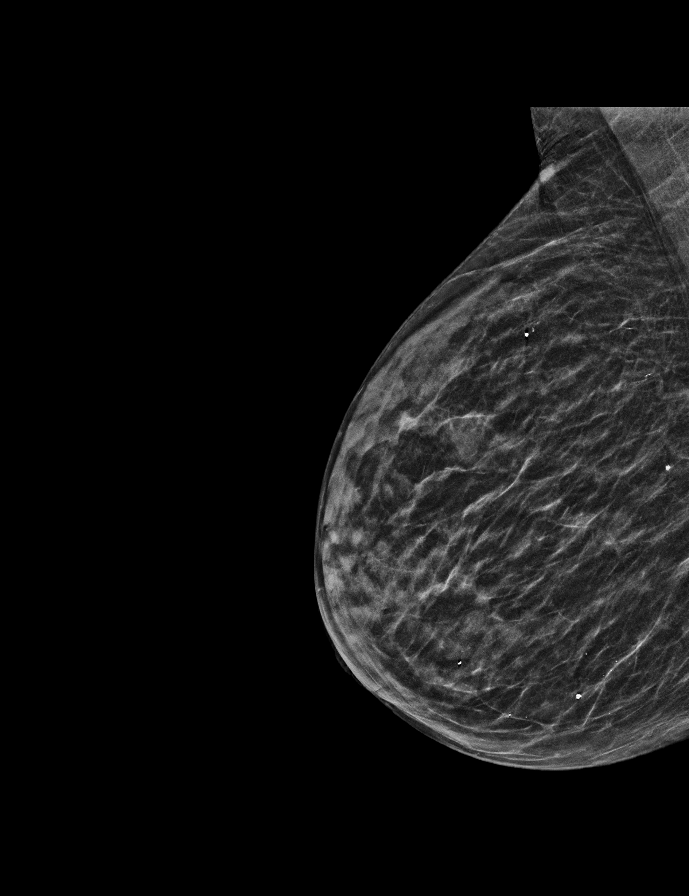

[R CC synth-2D]
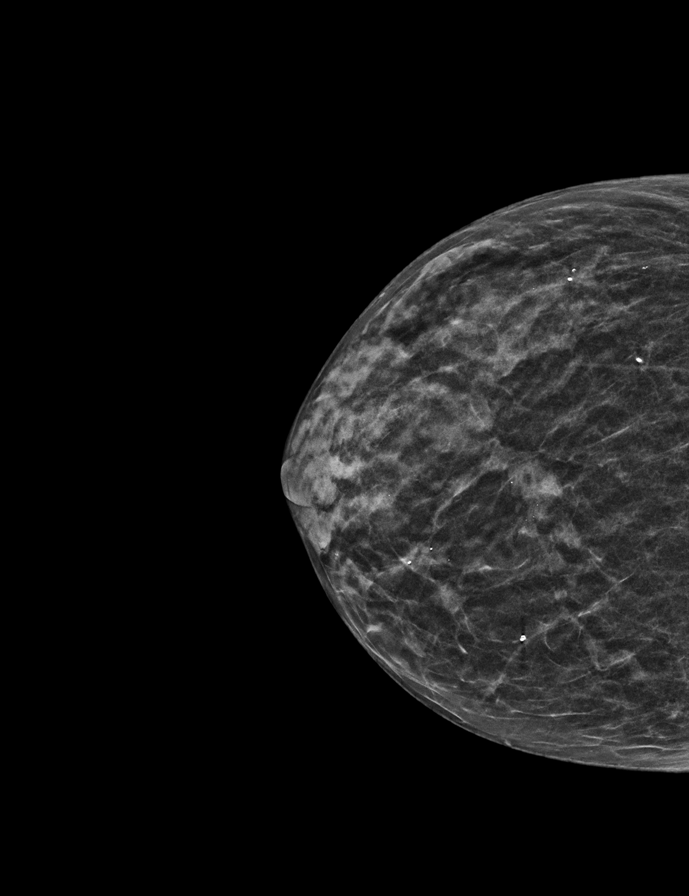

[R MLO tomo · tomo slice 20/39.0]
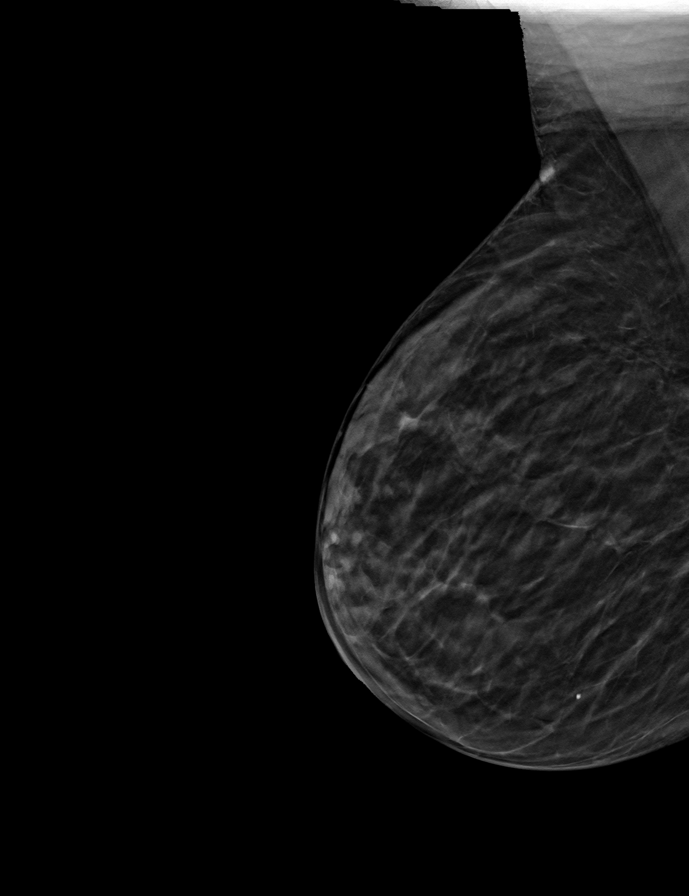

[L MLO tomo · tomo slice 34/67.0]
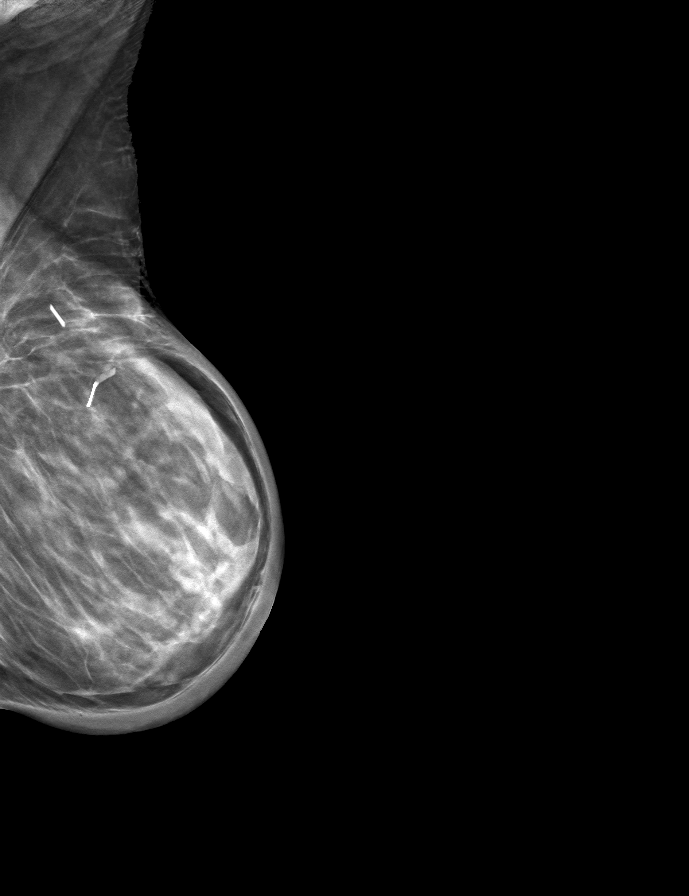

[L CC tomo · tomo slice 34/67.0]
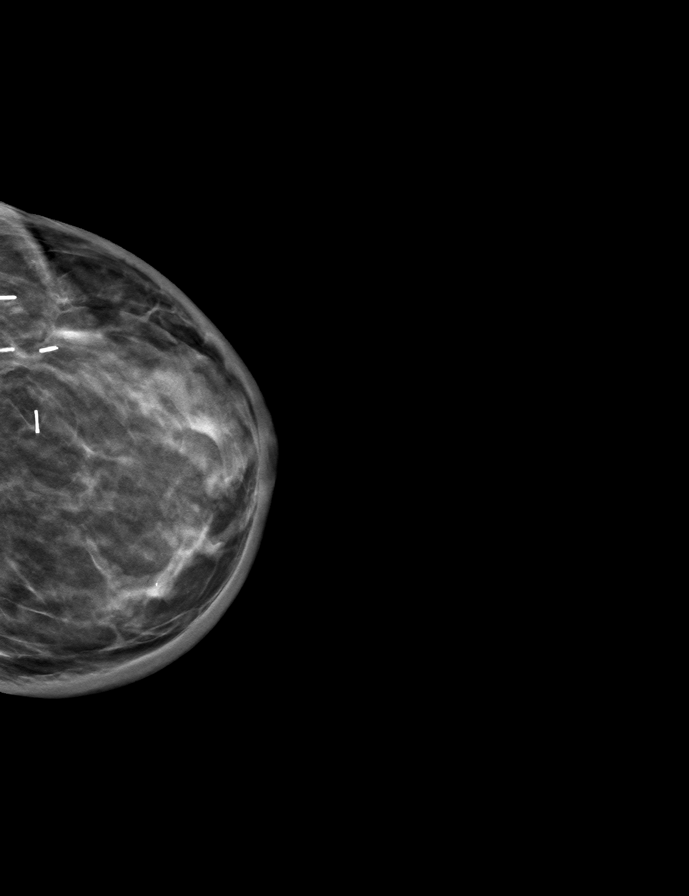

[R CC tomo · tomo slice 19/37.0]
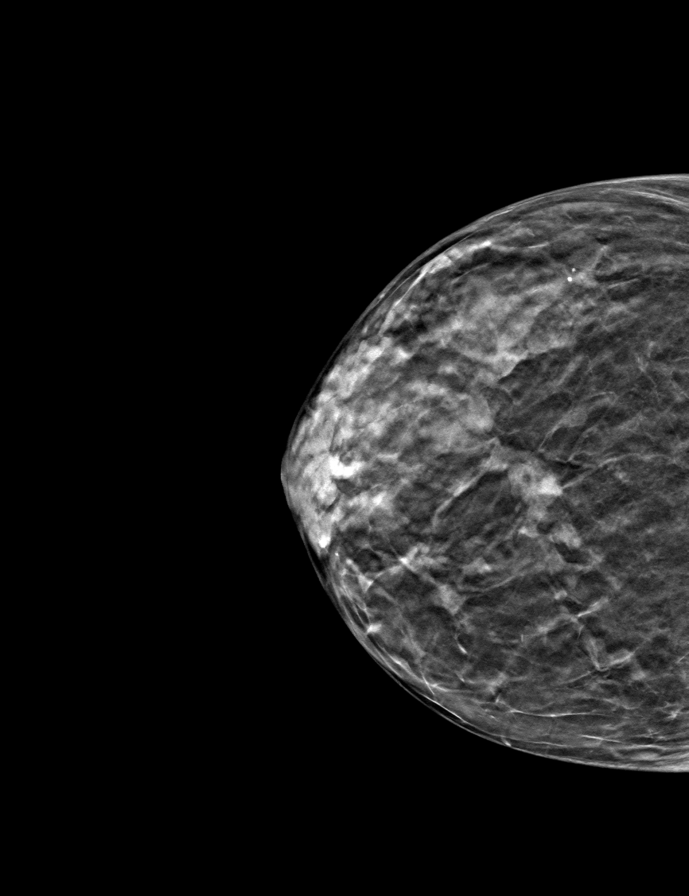

[9 of 25 positions shown; findings below may reference images not displayed]

ACR Breast Density Category c: The breast tissue is heterogeneously
dense, which may obscure small masses.
FINDINGS: Stable lumpectomy changes in the posterior third of the upper-outer
left breast. No mass, nonsurgical distortion, or suspicious
microcalcification is identified in either breast to suggest
malignancy.

Mammographic images were processed with CAD.
IMPRESSION: No evidence of malignancy in either breast. Lumpectomy changes on
the left.

RECOMMENDATION:
Diagnostic mammogram is suggested in 1 year. (Code:[id number])

I have discussed the findings and recommendations with the patient.
Results were also provided in writing at the conclusion of the
visit. If applicable, a reminder letter will be sent to the patient
regarding the next appointment.

BI-RADS CATEGORY  2: Benign.

## 2019-12-22 NOTE — Progress Notes (Signed)
Radiation Oncology         [phone] ________________________________  Name: Kendra Watts MRN: [id number]  Date: 12/23/2019  DOB: 1946-03-18  Follow-Up Visit Note  CC: Merrilee Seashore, MD  Nicholas Lose, MD    ICD-10-CM   1. Squamous cell lung cancer, left (HCC)  C34.92   2. Adenocarcinoma, lung, right ([place])  C34.91     Diagnosis:    1. 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 Stage IA (pT1c, pN0) left breast invasive ductal carcinoma, ER+ Ysidro Evert /Her2-  Interval Since Last Radiation: One year, one month, and three weeks.  1. 10/27/2018, 10/29/2018, 11/02/2018: Left and Right Lung targets were each treated to 54 Gy in 3 fractions 2. 10/30/2015 - 11/28/2015: Left Breast with Boost for a total of 52.72 Gy in 21 fractions  Narrative: The patient returns today for routine follow-up. Since her last visit, she was seen in the ED on 09/05/2019 for evaluation following a fall. Head/cervical spine/maxillofacial CT scans showed an extracranial hematoma overlying the right temporoparietal region without evidence of calvarial fracture. A stable medial right upper lobe nodule with adjacent fiducial markers was seen. There was no evidence of an acute intracranial abnormality, maxillofacial fracture, or traumatic injury to the cervical spine. Chest x-ray showed postprocedural changes in the lungs bilaterally without evidence of acute cardiopulmonary disease.  MRI of thoracic spine performed at [facility] on 10/11/2019 for back pain showed an acute T3 30% anterior wedge compression fracture with imaging characteristics favoring benign etiology. There were no other vertebral fractures or areas of abnormal marrow signals. It also showed a 1.5 x 0.6 cm right upper lobe posterior medial pulmonary nodule.  The patient had a tele-visit with Dr.  Lamonte Sakai on 10/27/2019, during which time she was to continue Stioloto and get a repeat CT scan of chest to determine whether they may be new nodular disease.  The patient underwent a bilateral diagnostic mammogram on 10/28/2019 that did not show any mammographic evidence of malignancy.   Restaging CT of chest on 11/15/2019 showed a mild interval growth of the irregular 1.0 cm solid peripheral right lower lobe pulmonary nodule, suspicious for malignancy, either metastasis or metachronous primary bronchogenic carcinoma. It also showed stability of the treated bilateral upper lobe pulmonary nodules. Finally, it showed stable, mild medial lymphadenopathy in addition to healing subacute anterior right third through seventh rib fractures and a new T3 vertebral compression fracture with associated sclerosis and no discrete osseous lesion. Dr. Lamonte Sakai recommended follow-up chest CT in six months. If it showed persistent growth, then he recommends discussing empiric XRT vs diagnostic procedure.  On review of systems, she reports some upper back pain related to her T3 vertebral body compression fracture.  This overall has improved. She denies worsening of her breathing significant cough or hemoptysis.  She continues to use 4 L of oxygen when sleeping at night but none during the day.  She continues to follow-up with [name] concerning her breast cancer.  She continues on tamoxifen.  ALLERGIES:  has No Known Allergies.  Meds: Current Outpatient Medications  Medication Sig Dispense Refill   albuterol (VENTOLIN HFA) 108 (90 Base) MCG/ACT inhaler Inhale 2 puffs into the lungs every 6 (six) hours as needed for wheezing or shortness of breath. 8 g 3   aspirin 81 MG tablet Take 81  mg by mouth daily.     Calcium-Magnesium-Vitamin D (CALCIUM 1200+D3 PO) Take 1 tablet by mouth daily.     doxazosin (CARDURA) 8 MG tablet Take 8 mg by mouth daily.      levothyroxine (SYNTHROID) 50 MCG tablet Take 50 mcg by mouth  daily.     metoprolol succinate (TOPROL-XL) 25 MG 24 hr tablet Take 25 mg by mouth daily.     Multiple Vitamins-Minerals (MULTIVITAMIN WITH MINERALS) tablet Take 1 tablet by mouth daily.      Naphazoline HCl (CLEAR EYES OP) Place 2 drops into both eyes daily.     pantoprazole (PROTONIX) 40 MG tablet Take 1 tablet (40 mg total) by mouth daily. 30 tablet 0   rosuvastatin (CRESTOR) 10 MG tablet Take 10 mg by mouth daily.      tamoxifen (NOLVADEX) 20 MG tablet TAKE 1 TABLET BY MOUTH  DAILY 90 tablet 3   Tiotropium Bromide-Olodaterol (STIOLTO RESPIMAT) 2.5-2.5 MCG/ACT AERS Inhale 2 puffs into the lungs daily. 4 g 12   furosemide (LASIX) 20 MG tablet Take 1 tablet (20 mg total) by mouth daily. 30 tablet 0   No current facility-administered medications for this encounter.    Physical Findings: The patient is in no acute distress. Patient is alert and oriented.  height is 5' (1.524 m) and weight is 90 lb 2 oz (40.9 kg). Her temporal temperature is 97.7 F (36.5 C). Her blood pressure is 118/78 and her pulse is 86. Her respiration is 18 and oxygen saturation is 93%.   No significant changes. Lungs are clear to auscultation bilaterally. Heart has regular rate and rhythm. No palpable cervical, supraclavicular, or axillary adenopathy. Abdomen soft, non-tender, normal bowel sounds.    Lab Findings: Lab Results  Component Value Date   WBC 7.5 09/05/2019   HGB 12.0 09/05/2019   HCT 36.1 09/05/2019   MCV 93.8 09/05/2019   PLT 163 09/05/2019    Radiographic Findings: No results found.  Impression: Synchronous bilateral stage 1 lung cancers  Recent chest CT scan shows stability of the 2 upper lung lesions treated with stereotactic body radiation therapy.  Patient may have a new growth in the right lower lung which warrants follow-up.  Recent chest CT scan shows this to measure approximately a centimeter in size.  Plan: Patient will return for routine follow-up in 6 months with a repeat  chest CT scan prior to that visit.   ____________________________________   Blair Promise, PhD, MD  This document serves as a record of services personally performed by [name], MD. It was created on his behalf by Clerance Lav, a trained medical scribe. The creation of this record is based on the scribe's personal observations and the provider's statements to them. This document has been checked and approved by the attending provider.

## 2019-12-23 ENCOUNTER — Ambulatory Visit
Admission: RE | Admit: 2019-12-23 | Discharge: 2019-12-23 | Disposition: A | Discharge status: Home / Self Care | Payer: Medicare Other | Source: Ambulatory Visit | Attending: Radiation Oncology | Admitting: Radiation Oncology

## 2019-12-23 ENCOUNTER — Other Ambulatory Visit: Payer: Self-pay

## 2019-12-23 VITALS — BP 118/78 | HR 86 | Temp 97.7°F | Resp 18 | Ht 60.0 in | Wt 90.1 lb

## 2019-12-23 DIAGNOSIS — R918 Other nonspecific abnormal finding of lung field: Secondary | ICD-10-CM | POA: Diagnosis not present

## 2019-12-23 DIAGNOSIS — Z85118 Personal history of other malignant neoplasm of bronchus and lung: Secondary | ICD-10-CM | POA: Insufficient documentation

## 2019-12-23 DIAGNOSIS — Z79899 Other long term (current) drug therapy: Secondary | ICD-10-CM | POA: Diagnosis not present

## 2019-12-23 DIAGNOSIS — Z170 Estrogen receptor positive status [ER+]: Secondary | ICD-10-CM | POA: Diagnosis not present

## 2019-12-23 DIAGNOSIS — Z853 Personal history of malignant neoplasm of breast: Secondary | ICD-10-CM | POA: Insufficient documentation

## 2019-12-23 DIAGNOSIS — C50912 Malignant neoplasm of unspecified site of left female breast: Secondary | ICD-10-CM | POA: Diagnosis not present

## 2019-12-23 DIAGNOSIS — Z7982 Long term (current) use of aspirin: Secondary | ICD-10-CM | POA: Insufficient documentation

## 2019-12-23 DIAGNOSIS — Z08 Encounter for follow-up examination after completed treatment for malignant neoplasm: Secondary | ICD-10-CM | POA: Diagnosis not present

## 2019-12-23 DIAGNOSIS — Z79810 Long term (current) use of selective estrogen receptor modulators (SERMs): Secondary | ICD-10-CM | POA: Insufficient documentation

## 2019-12-23 DIAGNOSIS — C3492 Malignant neoplasm of unspecified part of left bronchus or lung: Secondary | ICD-10-CM

## 2019-12-23 DIAGNOSIS — Z923 Personal history of irradiation: Secondary | ICD-10-CM | POA: Insufficient documentation

## 2019-12-23 DIAGNOSIS — C3491 Malignant neoplasm of unspecified part of right bronchus or lung: Secondary | ICD-10-CM

## 2019-12-23 DIAGNOSIS — M8448XD Pathological fracture, other site, subsequent encounter for fracture with routine healing: Secondary | ICD-10-CM | POA: Insufficient documentation

## 2019-12-23 NOTE — Progress Notes (Signed)
Patient here for a f/u visit with Dr. Sondra Come.  Pain: no  Fatigue: mild she thinks i9t is related to her thyyroid.  Shortness of breath when "rushing around."  Cough: clear phlegm daily  No problems with swallowing, no Carafate.  Wt. Varies a few pounds due to thyroid per pt.  Patient reports O2 sats WNL during the day.  Sleeps with O2 at 4L at night.  BP 118/78 (BP Location: Right Arm, Patient Position: Sitting)   Pulse 86   Temp 97.7 F (36.5 C) (Temporal)   Resp 18   Ht 5' (1.524 m)   Wt 90 lb 2 oz (40.9 kg)   SpO2 93%   BMI 17.60 kg/m   Wt Readings from Last 3 Encounters:  12/23/19 90 lb 2 oz (40.9 kg)  08/02/19 91 lb 9.6 oz (41.5 kg)  06/21/19 93 lb 9.6 oz (42.5 kg)

## 2020-01-25 ENCOUNTER — Other Ambulatory Visit: Payer: Self-pay | Admitting: *Deleted

## 2020-01-25 MED ORDER — TAMOXIFEN CITRATE 20 MG PO TABS: 20.0000 mg | ORAL_TABLET | Freq: Every day | ORAL | 0 refills | Status: DC

## 2020-01-26 ENCOUNTER — Telehealth: Payer: Self-pay | Admitting: Hematology and Oncology

## 2020-01-26 NOTE — Telephone Encounter (Signed)
Scheduled appt per 7/6 sch msg - unable to reach pt . Left message and mailed reminder letter.

## 2020-02-01 DIAGNOSIS — H25812 Combined forms of age-related cataract, left eye: Secondary | ICD-10-CM | POA: Diagnosis not present

## 2020-02-01 DIAGNOSIS — H52202 Unspecified astigmatism, left eye: Secondary | ICD-10-CM | POA: Diagnosis not present

## 2020-02-23 DIAGNOSIS — M810 Age-related osteoporosis without current pathological fracture: Secondary | ICD-10-CM | POA: Diagnosis not present

## 2020-02-23 DIAGNOSIS — E890 Postprocedural hypothyroidism: Secondary | ICD-10-CM | POA: Diagnosis not present

## 2020-02-24 ENCOUNTER — Telehealth: Payer: Self-pay | Admitting: Cardiovascular Disease

## 2020-02-24 NOTE — Telephone Encounter (Signed)
Spoke with pt, she reports a burning and tingling in her feet and legs that has been going on for sometime now and it seems to be getting worse. She denies any non-healing wounds or discoloration of legs and feet. She reports she has had this in the past related to her PAD. She is due for follow up dopplers in October but will go ahead and schedule and then follow up with dr berry after completed. Pt agreed with this plan. Follow up scheduled

## 2020-02-24 NOTE — Telephone Encounter (Signed)
Pt says that she is experiencing poor circulation in her legs and and feet and they have a constant tingling feeling. Wondering if she had a blockage somewhere. Wanted to see Dr. Gwenlyn Found asap but informed patient of next available appt in sept. Please call to discuss.

## 2020-03-01 DIAGNOSIS — J449 Chronic obstructive pulmonary disease, unspecified: Secondary | ICD-10-CM | POA: Diagnosis not present

## 2020-03-01 DIAGNOSIS — Z681 Body mass index (BMI) 19 or less, adult: Secondary | ICD-10-CM | POA: Diagnosis not present

## 2020-03-01 DIAGNOSIS — E890 Postprocedural hypothyroidism: Secondary | ICD-10-CM | POA: Diagnosis not present

## 2020-03-01 DIAGNOSIS — Z8639 Personal history of other endocrine, nutritional and metabolic disease: Secondary | ICD-10-CM | POA: Diagnosis not present

## 2020-03-01 DIAGNOSIS — F17210 Nicotine dependence, cigarettes, uncomplicated: Secondary | ICD-10-CM | POA: Diagnosis not present

## 2020-03-01 DIAGNOSIS — M810 Age-related osteoporosis without current pathological fracture: Secondary | ICD-10-CM | POA: Diagnosis not present

## 2020-03-03 ENCOUNTER — Other Ambulatory Visit: Payer: Self-pay

## 2020-03-03 ENCOUNTER — Encounter (HOSPITAL_COMMUNITY): Payer: Self-pay | Admitting: Emergency Medicine

## 2020-03-03 ENCOUNTER — Emergency Department (HOSPITAL_COMMUNITY)
Admission: EM | Admit: 2020-03-03 | Discharge: 2020-03-03 | Disposition: A | Discharge status: Home / Self Care | Payer: Medicare Other | Attending: Emergency Medicine | Admitting: Emergency Medicine

## 2020-03-03 DIAGNOSIS — Z853 Personal history of malignant neoplasm of breast: Secondary | ICD-10-CM | POA: Insufficient documentation

## 2020-03-03 DIAGNOSIS — E039 Hypothyroidism, unspecified: Secondary | ICD-10-CM | POA: Diagnosis not present

## 2020-03-03 DIAGNOSIS — J449 Chronic obstructive pulmonary disease, unspecified: Secondary | ICD-10-CM | POA: Insufficient documentation

## 2020-03-03 DIAGNOSIS — Z7951 Long term (current) use of inhaled steroids: Secondary | ICD-10-CM | POA: Diagnosis not present

## 2020-03-03 DIAGNOSIS — I509 Heart failure, unspecified: Secondary | ICD-10-CM | POA: Insufficient documentation

## 2020-03-03 DIAGNOSIS — D649 Anemia, unspecified: Secondary | ICD-10-CM | POA: Diagnosis not present

## 2020-03-03 DIAGNOSIS — I110 Hypertensive heart disease with heart failure: Secondary | ICD-10-CM | POA: Diagnosis not present

## 2020-03-03 DIAGNOSIS — R0902 Hypoxemia: Secondary | ICD-10-CM | POA: Diagnosis not present

## 2020-03-03 DIAGNOSIS — I499 Cardiac arrhythmia, unspecified: Secondary | ICD-10-CM | POA: Diagnosis not present

## 2020-03-03 DIAGNOSIS — R040 Epistaxis: Secondary | ICD-10-CM | POA: Diagnosis not present

## 2020-03-03 DIAGNOSIS — F17210 Nicotine dependence, cigarettes, uncomplicated: Secondary | ICD-10-CM | POA: Insufficient documentation

## 2020-03-03 DIAGNOSIS — Z743 Need for continuous supervision: Secondary | ICD-10-CM | POA: Diagnosis not present

## 2020-03-03 DIAGNOSIS — R531 Weakness: Secondary | ICD-10-CM | POA: Diagnosis not present

## 2020-03-03 LAB — CBC WITH DIFFERENTIAL/PLATELET
Abs Immature Granulocytes: 0.03 10*3/uL (ref 0.00–0.07)
Basophils Absolute: 0 10*3/uL (ref 0.0–0.1)
Basophils Relative: 1 %
Eosinophils Absolute: 0.1 10*3/uL (ref 0.0–0.5)
Eosinophils Relative: 1 %
HCT: 31.6 % — ABNORMAL LOW (ref 36.0–46.0)
Hemoglobin: 10.6 g/dL — ABNORMAL LOW (ref 12.0–15.0)
Immature Granulocytes: 0 %
Lymphocytes Relative: 11 %
Lymphs Abs: 0.9 10*3/uL (ref 0.7–4.0)
MCH: 31.2 pg (ref 26.0–34.0)
MCHC: 33.5 g/dL (ref 30.0–36.0)
MCV: 92.9 fL (ref 80.0–100.0)
Monocytes Absolute: 0.5 10*3/uL (ref 0.1–1.0)
Monocytes Relative: 6 %
Neutro Abs: 7.1 10*3/uL (ref 1.7–7.7)
Neutrophils Relative %: 81 %
Platelets: 189 10*3/uL (ref 150–400)
RBC: 3.4 MIL/uL — ABNORMAL LOW (ref 3.87–5.11)
RDW: 17 % — ABNORMAL HIGH (ref 11.5–15.5)
WBC: 8.8 10*3/uL (ref 4.0–10.5)
nRBC: 0 % (ref 0.0–0.2)

## 2020-03-03 LAB — BASIC METABOLIC PANEL
Anion gap: 10 (ref 5–15)
BUN: 42 mg/dL — ABNORMAL HIGH (ref 8–23)
CO2: 28 mmol/L (ref 22–32)
Calcium: 9.1 mg/dL (ref 8.9–10.3)
Chloride: 95 mmol/L — ABNORMAL LOW (ref 98–111)
Creatinine, Ser: 0.92 mg/dL (ref 0.44–1.00)
GFR calc Af Amer: >60 mL/min (ref >60)
GFR calc non Af Amer: >60 mL/min (ref >60)
Glucose, Bld: 108 mg/dL — ABNORMAL HIGH (ref 70–99)
Potassium: 4.5 mmol/L (ref 3.5–5.1)
Sodium: 133 mmol/L — ABNORMAL LOW (ref 135–145)

## 2020-03-03 LAB — POC OCCULT BLOOD, ED: Fecal Occult Bld: NEGATIVE

## 2020-03-03 LAB — PROTIME-INR
INR: 1 (ref 0.8–1.2)
Prothrombin Time: 13.1 seconds (ref 11.4–15.2)

## 2020-03-03 MED ORDER — LIDOCAINE HCL URETHRAL/MUCOSAL 2 % EX GEL
1.0000 "application " | Freq: Once | CUTANEOUS | Status: AC
  Administered 2020-03-03: 1
  Filled 2020-03-03: qty 22

## 2020-03-03 MED ORDER — PHENYLEPHRINE HCL 0.5 % NA SOLN
1.0000 [drp] | Freq: Once | NASAL | Status: AC
  Administered 2020-03-03: 1 [drp] via NASAL
  Filled 2020-03-03: qty 15

## 2020-03-03 NOTE — Discharge Instructions (Signed)
1.  Your nasal packing should be removed within 48 hours of placement.  Return to the emergency department or urgent care for removal.  Call your ear nose throat doctor today to schedule a follow-up appointment.  You should try to be seen as soon as possible for reexamination. 2.  If you have rebleeding around your packing, you must return to the emergency department. 3.  See your family doctor for recheck.  Your hemoglobin level has decreased over the past several months.  I suspect there may be a cause other than nasal bleeding.

## 2020-03-03 NOTE — ED Notes (Signed)
Pt was called back for room, no response.

## 2020-03-03 NOTE — ED Triage Notes (Signed)
Patient came by [facility] from home. Patient complaining of nose bleed since last night.

## 2020-03-03 NOTE — ED Provider Notes (Signed)
[facility] DEPT Provider Note   CSN: [id number] Arrival date & time: 03/03/20  8938     History Chief Complaint  Patient presents with  . Epistaxis    Kendra Watts is a 74 y.o. female.  HPI Patient reports bleeding from both nares onset 2 AM.  Patient was awakened from sleep by blood on her face.  Patient sleeps with oxygen nightly.  No associated pain.  Patient has not been ill recently.  Patient had posterior bleeding, having to spit it out.  She estimates about 4 ounces of blood loss.  EMS placed gauze packing in patient's nose.  Patient takes daily aspirin no other anticoagulants.  History of nasal bleeding 2 years ago, patient describes as minor.  No interventions required at that time.  Significant history includes lung cancer and breast cancer.  Treatment has been completed.  Patient reports herself to be in remission.  She does have history of radiation therapy.    Past Medical History:  Diagnosis Date  . Anemia   . 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 Breast cancer ([place]) 09/22/2015   Malignant  . Breast cancer of upper-outer quadrant of left female breast ([place]) 09/08/2015  . Cataract, immature   . CHF (congestive heart failure) ([place])    Acute CHF-06/2018  . COPD (chronic obstructive pulmonary disease) ([place])   . Heart murmur    states no known problems  . History of hyperthyroidism   . Hyperlipidemia   . Hypertension    states under control with meds., has been on med. x "long time"  . Hypokalemia    from last physical.   . Hypothyroidism   . Nonfunctioning kidney    left  . Personal history of radiation therapy    2017  . Pulmonary nodules    Bilateral  . Radiation 10/30/15-11/28/15   left breast 42.72 Gy, boosted to 10 Gy  . Renal artery stenosis ([place])   . Tobacco abuse   . Wears partial dentures    upper and lower    Patient Active Problem List   Diagnosis Date Noted  . Hemoptysis 09/14/2018  . Squamous cell lung  cancer, left ([place]) 09/10/2018  . Adenocarcinoma, lung, right ([place]) 09/10/2018  . Multiple lung nodules 07/20/2018  . Chronic obstructive pulmonary disease ([place]) 07/20/2018  . Acute CHF (congestive heart failure) ([place]) 07/06/2018  . Chronic respiratory failure with hypoxia ([place]) 07/06/2018  . Hyponatremia 07/06/2018  . Anemia 07/06/2018  . Breast cancer of upper-outer quadrant of left female breast ([place]) 09/08/2015  . Renal artery stenosis ([place]) 08/23/2014  . Right upper extremity numbness 07/01/2013  . Atherosclerotic renal artery stenosis, bilateral ([place]) 05/26/2013  . Peripheral arterial disease ([place]) 05/26/2013  . Essential hypertension 05/26/2013  . Hyperlipidemia 05/26/2013  . Tobacco abuse 05/26/2013  . Carotid artery stenosis 03/09/2012    Past Surgical History:  Procedure Laterality Date  . ABDOMINAL ANGIOGRAM  02/18/2012   Procedure: ABDOMINAL ANGIOGRAM;  Surgeon: Lorretta Harp, MD;  Location: [facility] CATH LAB;  Service: Cardiovascular;;  . ABDOMINAL AORTAGRAM  07/04/2014  . ABDOMINAL HYSTERECTOMY  ~ 1977   partial  . APPENDECTOMY    . ARCH AORTOGRAM    . BREAST LUMPECTOMY Left 09/22/2015   Malignant  . CAROTID ANGIOGRAM N/A 02/18/2012   Procedure: CAROTID ANGIOGRAM;  Surgeon: Lorretta Harp, MD;  Location: [facility] CATH LAB;  Service: Cardiovascular;  Laterality: N/A;  . ENDARTERECTOMY  04/02/2012   Procedure: ENDARTERECTOMY CAROTID;  Surgeon: Serafina Mitchell, MD;  Location: [facility];  Service: Vascular;  Laterality: Right;  . FUDUCIAL PLACEMENT Bilateral 09/02/2018   Procedure: Placement Of Fiducial to right upper lobe & left upper lobe lung;  Surgeon: [name], MD;  Location: [facility] OR;  Service: Thoracic;  Laterality: Bilateral;  . IR THORACENTESIS ASP PLEURAL SPACE W/IMG GUIDE  07/08/2018  . RADIOACTIVE SEED GUIDED PARTIAL MASTECTOMY WITH AXILLARY SENTINEL LYMPH NODE BIOPSY Left 09/22/2015   Procedure: INJECT BLUE DYE LEFT BREAST,RADIOACTIVE SEED GUIDED PARTIAL MASTECTOMY WITH  AXILLARY SENTINEL LYMPH NODE BIOPSY;  Surgeon: Fanny Skates, MD;  Location: [facility];  Service: General;  Laterality: Left;  . RENAL ANGIOGRAM Left 06/08/2010   renal artery stent -  5x12 Genesis on Aviator balloon stent (Dr. Adora Fridge)  . RENAL ANGIOGRAM Right 07/04/2014   Procedure: RENAL ANGIOGRAM;  Surgeon: Lorretta Harp, MD;  Location: [facility] CATH LAB;  Service: Cardiovascular;  Laterality: Right;  . RENAL ANGIOGRAM Right 08/22/2014   Procedure: RENAL ANGIOGRAM;  Surgeon: Lorretta Harp, MD;  Location: [facility] CATH LAB;  Service: Cardiovascular;  Laterality: Right;  . TONSILLECTOMY     as a child  . VIDEO BRONCHOSCOPY WITH ENDOBRONCHIAL NAVIGATION N/A 09/02/2018   Procedure: VIDEO BRONCHOSCOPY WITH ENDOBRONCHIAL NAVIGATION;  Surgeon: [name], MD;  Location: [facility] OR;  Service: Thoracic;  Laterality: N/A;     OB History   No obstetric history on file.     Family History  Problem Relation Age of Onset  . Heart disease Mother        MI @ 75, died at 44  . Cancer Father   . Lung cancer Father   . Heart disease Maternal Grandmother   . Lung cancer Sister   . Breast cancer Paternal Aunt     Social History   Tobacco Use  . Smoking status: Current Every Day Smoker    Packs/day: 0.50    Years: 40.00    Pack years: 20.00    Types: Cigarettes  . Smokeless tobacco: Never Used  . Tobacco comment: as of 09/14/2018, less than 1 pack/day  Vaping Use  . Vaping Use: Never used  Substance Use Topics  . Alcohol use: Yes    Comment: occasionally  . Drug use: No    Home Medications Prior to Admission medications   Medication Sig Start Date End Date Taking? Authorizing Provider  albuterol (VENTOLIN HFA) 108 (90 Base) MCG/ACT inhaler Inhale 2 puffs into the lungs every 6 (six) hours as needed for wheezing or shortness of breath. 02/02/19  Yes [name], MD  aspirin 81 MG tablet Take 81 mg by mouth daily.   Yes [provider]  Calcium-Magnesium-Vitamin D  (CALCIUM 1200+D3 PO) Take 1 tablet by mouth daily.   Yes [provider]  doxazosin (CARDURA) 8 MG tablet Take 8 mg by mouth daily.    Yes [provider]  furosemide (LASIX) 20 MG tablet Take 1 tablet (20 mg total) by mouth daily. 07/13/18 03/03/20 Yes Hall, Carole N, DO  ibandronate (BONIVA) 150 MG tablet Take 150 mg by mouth every 30 (thirty) days. 01/19/20  Yes [provider]  levothyroxine (SYNTHROID) 50 MCG tablet Take 50 mcg by mouth daily. 12/08/19  Yes [provider]  metoprolol tartrate (LOPRESSOR) 25 MG tablet Take 25 mg by mouth daily. 02/19/20  Yes [provider]  moxifloxacin (VIGAMOX) 0.5 % ophthalmic solution Place 1 drop into the left eye 4 (four) times daily. 01/26/20  Yes [provider]  Multiple Vitamins-Minerals (MULTIVITAMIN WITH MINERALS) tablet Take 1 tablet by mouth daily.    Yes [provider]  Naphazoline HCl (CLEAR EYES OP) Place 2 drops into both eyes daily.   Yes [provider]  pantoprazole (PROTONIX) 40 MG tablet Take 1 tablet (40 mg total) by mouth daily. 07/14/18  Yes Hall, Carole N, DO  PROLENSA 0.07 % SOLN Place 1 drop into the left eye daily. 01/26/20  Yes [provider]  rosuvastatin (CRESTOR) 10 MG tablet Take 10 mg by mouth daily.    Yes [provider]  tamoxifen (NOLVADEX) 20 MG tablet Take 1 tablet (20 mg total) by mouth daily. 01/25/20  Yes Nicholas Lose, MD  Tiotropium Bromide-Olodaterol (STIOLTO RESPIMAT) 2.5-2.5 MCG/ACT AERS Inhale 2 puffs into the lungs daily. 10/27/19  Yes [name], MD    Allergies    Patient has no known allergies.  Review of Systems   Review of Systems 10 systems reviewed and negative except as per HPI Physical Exam Updated Vital Signs BP (!) 146/97   Pulse 88   Temp 97.7 F (36.5 C) (Oral)   Resp 16   Ht 5' (1.524 m)   Wt 40.8 kg   SpO2 96%   BMI 17.58 kg/m   Physical Exam Constitutional:      Comments: Patient is alert  and nontoxic.  Mental status clear.  HENT:     Head: Normocephalic and atraumatic.     Nose:     Comments: Patient has a pool of semiclotted blood in the floor of both nares.  Active bleeding has stopped at time of examination.  Patient has a large, dangling clot in the posterior oropharynx. Eyes:     Extraocular Movements: Extraocular movements intact.  Cardiovascular:     Comments: Borderline tachycardia.  No gross rub murmur gallop.  Heart is regular. Pulmonary:     Effort: Pulmonary effort is normal.     Breath sounds: Normal breath sounds.  Abdominal:     General: There is no distension.     Palpations: Abdomen is soft.     Tenderness: There is no abdominal tenderness.  Genitourinary:    Comments: Rectal exam: Formed stool in the vault.  [place].  No visible blood or melena. Musculoskeletal:        General: Normal range of motion.     Cervical back: Neck supple.  Skin:    General: Skin is warm and dry.  Neurological:     General: No focal deficit present.     Mental Status: She is oriented to person, place, and time.     Coordination: Coordination normal.  Psychiatric:        Mood and Affect: Mood normal.     ED Results / Procedures / Treatments   Labs (all labs ordered are listed, but only abnormal results are displayed) Labs Reviewed  BASIC METABOLIC PANEL - Abnormal; Notable for the following components:      Result Value   Sodium 133 (*)    Chloride 95 (*)    Glucose, Bld 108 (*)    BUN 42 (*)    All other components within normal limits  CBC WITH DIFFERENTIAL/PLATELET - Abnormal; Notable for the following components:   RBC 3.40 (*)    Hemoglobin 10.6 (*)    HCT 31.6 (*)    RDW 17.0 (*)    All other components within normal limits  PROTIME-INR  POC OCCULT BLOOD, ED  EKG None  Radiology No results found.  Procedures .Epistaxis Management  Date/Time: 03/03/2020 12:19 PM Performed by: Charlesetta Shanks, MD Authorized by: Charlesetta Shanks, MD    Consent:    Consent obtained:  Verbal   Consent given by:  Patient   Risks discussed:  Bleeding, infection, nasal injury and pain Anesthesia (see MAR for exact dosages):    Anesthesia method:  Topical application   Topical anesthetic:  Lidocaine gel Procedure details:    Treatment site:  L posterior   Treatment method:  Nasal balloon   Treatment complexity:  Extensive   Treatment episode: initial   Post-procedure details:    Assessment:  Bleeding stopped   Patient tolerance of procedure:  Tolerated well, no immediate complications Comments:     Very large clot evacuated by blowing and suction.  Both nares are clear after clot evacuation.  No persisting clot in the posterior oropharynx or tracking blood.  Neo-Synephrine and lidocaine gel used in nasal passages.  Anterior posterior rapid Rhino balloon packing placed without difficulty.  Anterior and posterior balloons inflated.  Patient tolerated well.  Rechecked after 30 minutes and no bleeding around packing.   (including critical care time)  Medications Ordered in ED Medications  lidocaine (XYLOCAINE) 2 % jelly 1 application (1 application Other Given by Other 03/03/20 0818)  phenylephrine (NEO-SYNEPHRINE) 0.5 % nasal solution 1 drop (1 drop Each Nare Given by Other 03/03/20 0818)    ED Course  I have reviewed the triage vital signs and the nursing notes.  Pertinent labs & imaging results that were available during my care of the patient were reviewed by me and considered in my medical decision making (see chart for details).    MDM Rules/Calculators/A&P                         Patient presented with aggressive bleeding at home.  She could not identify which nare had actually blood.  On exam, there was very large clot in the left nare.  It was evacuated and patient was packed with good placement and no bleeding.  This time patient is advised for 48-hour removal of the packing.  She is not on any anticoagulants.  She is not  hypertensive.  She has history of nasal bleed with packing 2 years ago.  She will need repeat evaluation in ENT.  Return precautions reviewed.  Secondary the finding is anemia relative to February.  Patient is down 2 mg/dL hemoglobin.  I doubt this is secondary to acute nosebleed.  She also has some elevation in her BUN.  Rectal exam is negative for melena and heme card is negative.  At this time she is stable for discharge and further outpatient work-up for indolently developing anemia over past 6 months.  Final Clinical Impression(s) / ED Diagnoses Final diagnoses:  Left-sided epistaxis  Anemia, unspecified type    Rx / DC Orders ED Discharge Orders    None       Charlesetta Shanks, MD 03/03/20 1227

## 2020-03-07 ENCOUNTER — Encounter (HOSPITAL_COMMUNITY): Payer: Self-pay

## 2020-03-07 ENCOUNTER — Emergency Department (HOSPITAL_COMMUNITY)
Admission: EM | Admit: 2020-03-07 | Discharge: 2020-03-07 | Disposition: A | Discharge status: Home / Self Care | Payer: Medicare Other | Attending: Emergency Medicine | Admitting: Emergency Medicine

## 2020-03-07 ENCOUNTER — Other Ambulatory Visit: Payer: Self-pay

## 2020-03-07 DIAGNOSIS — I110 Hypertensive heart disease with heart failure: Secondary | ICD-10-CM | POA: Insufficient documentation

## 2020-03-07 DIAGNOSIS — Z853 Personal history of malignant neoplasm of breast: Secondary | ICD-10-CM | POA: Insufficient documentation

## 2020-03-07 DIAGNOSIS — R040 Epistaxis: Secondary | ICD-10-CM

## 2020-03-07 DIAGNOSIS — I509 Heart failure, unspecified: Secondary | ICD-10-CM | POA: Insufficient documentation

## 2020-03-07 DIAGNOSIS — E039 Hypothyroidism, unspecified: Secondary | ICD-10-CM | POA: Insufficient documentation

## 2020-03-07 DIAGNOSIS — Z7951 Long term (current) use of inhaled steroids: Secondary | ICD-10-CM | POA: Insufficient documentation

## 2020-03-07 DIAGNOSIS — R58 Hemorrhage, not elsewhere classified: Secondary | ICD-10-CM | POA: Diagnosis not present

## 2020-03-07 DIAGNOSIS — Z7982 Long term (current) use of aspirin: Secondary | ICD-10-CM | POA: Insufficient documentation

## 2020-03-07 DIAGNOSIS — J449 Chronic obstructive pulmonary disease, unspecified: Secondary | ICD-10-CM | POA: Insufficient documentation

## 2020-03-07 DIAGNOSIS — Z79899 Other long term (current) drug therapy: Secondary | ICD-10-CM | POA: Insufficient documentation

## 2020-03-07 DIAGNOSIS — R0902 Hypoxemia: Secondary | ICD-10-CM | POA: Diagnosis not present

## 2020-03-07 DIAGNOSIS — R6889 Other general symptoms and signs: Secondary | ICD-10-CM | POA: Diagnosis not present

## 2020-03-07 DIAGNOSIS — F17210 Nicotine dependence, cigarettes, uncomplicated: Secondary | ICD-10-CM | POA: Diagnosis not present

## 2020-03-07 DIAGNOSIS — Z743 Need for continuous supervision: Secondary | ICD-10-CM | POA: Diagnosis not present

## 2020-03-07 MED ORDER — OXYMETAZOLINE HCL 0.05 % NA SOLN
3.0000 | Freq: Once | NASAL | Status: AC
  Administered 2020-03-07: 3 via NASAL
  Filled 2020-03-07: qty 30

## 2020-03-07 MED ORDER — CEPHALEXIN 500 MG PO CAPS
500.0000 mg | ORAL_CAPSULE | Freq: Four times a day (QID) | ORAL | 0 refills | Status: DC
Start: 2020-03-07 — End: 2020-03-18

## 2020-03-07 MED ORDER — CEPHALEXIN 500 MG PO CAPS
500.0000 mg | ORAL_CAPSULE | Freq: Once | ORAL | Status: AC
  Administered 2020-03-07: 500 mg via ORAL
  Filled 2020-03-07: qty 1

## 2020-03-07 NOTE — ED Triage Notes (Addendum)
Pt BIB EMS from home c/o nose bleed that began at 0100. Pt was seen at [facility] on 8/13 for same complaint and had a balloon placed in left nostril. Balloon is still in place even though it was supposed to be removed within 48 hours of placement. EMS reports a blood loss of 50-35ml. Bleeding controlled with afrin soaked gauze at this time. Pt not on blood thinners.

## 2020-03-07 NOTE — ED Provider Notes (Signed)
[facility] DEPT Provider Note: Georgena Spurling, MD, FACEP  CSN: [id number] MRN: [id number] ARRIVAL: 03/07/20 at [place]: [place]  Epistaxis   HISTORY OF PRESENT ILLNESS  03/07/20 4:24 AM Kendra Watts is a 74 y.o. female who was seen in the ED on 03/03/2020 for nosebleed.  She had a dual balloon Rapid Rhino balloon packing placed in her left naris per the visit note. She was supposed to have the Rapid Rhino balloons removed 2 days later.  She went to [facility] yesterday and they told her that they did not feel comfortable removing them so they remain.  She called EMS this morning because she awakened with a right-sided nosebleed which was fairly heavy.  Bleeding was controlled with Afrin soaked gauze.  EMS estimates 50 to 75 mL of blood loss prior to arrival.  She is not in any pain.  She was noted to be anemic (10.6/31.6) on her previous visit.   Past Medical History:  Diagnosis Date  . Anemia   . 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 Breast cancer ([place]) 09/22/2015   Malignant  . Breast cancer of upper-outer quadrant of left female breast ([place]) 09/08/2015  . Cataract, immature   . CHF (congestive heart failure) ([place])    Acute CHF-06/2018  . COPD (chronic obstructive pulmonary disease) ([place])   . Heart murmur    states no known problems  . History of hyperthyroidism   . Hyperlipidemia   . Hypertension    states under control with meds., has been on med. x "long time"  . Hypokalemia    from last physical.   . Hypothyroidism   . Nonfunctioning kidney    left  . Personal history of radiation therapy    2017  . Pulmonary nodules    Bilateral  . Radiation 10/30/15-11/28/15   left breast 42.72 Gy, boosted to 10 Gy  . Renal artery stenosis ([place])   . Tobacco abuse   . Wears partial dentures    upper and lower    Past Surgical History:  Procedure Laterality Date  . ABDOMINAL ANGIOGRAM  02/18/2012   Procedure: ABDOMINAL ANGIOGRAM;   Surgeon: Lorretta Harp, MD;  Location: [facility] CATH LAB;  Service: Cardiovascular;;  . ABDOMINAL AORTAGRAM  07/04/2014  . ABDOMINAL HYSTERECTOMY  ~ 1977   partial  . APPENDECTOMY    . ARCH AORTOGRAM    . BREAST LUMPECTOMY Left 09/22/2015   Malignant  . CAROTID ANGIOGRAM N/A 02/18/2012   Procedure: CAROTID ANGIOGRAM;  Surgeon: Lorretta Harp, MD;  Location: [facility] CATH LAB;  Service: Cardiovascular;  Laterality: N/A;  . ENDARTERECTOMY  04/02/2012   Procedure: ENDARTERECTOMY CAROTID;  Surgeon: Serafina Mitchell, MD;  Location: [facility] OR;  Service: Vascular;  Laterality: Right;  . FUDUCIAL PLACEMENT Bilateral 09/02/2018   Procedure: Placement Of Fiducial to right upper lobe & left upper lobe lung;  Surgeon: [name], MD;  Location: [facility] OR;  Service: Thoracic;  Laterality: Bilateral;  . IR THORACENTESIS ASP PLEURAL SPACE W/IMG GUIDE  07/08/2018  . RADIOACTIVE SEED GUIDED PARTIAL MASTECTOMY WITH AXILLARY SENTINEL LYMPH NODE BIOPSY Left 09/22/2015   Procedure: INJECT BLUE DYE LEFT BREAST,RADIOACTIVE SEED GUIDED PARTIAL MASTECTOMY WITH AXILLARY SENTINEL LYMPH NODE BIOPSY;  Surgeon: Fanny Skates, MD;  Location: [facility];  Service: General;  Laterality: Left;  . RENAL ANGIOGRAM Left 06/08/2010   renal artery stent -  5x12 Genesis on Aviator balloon stent (Dr. Lenna Sciara.  Gwen[facility] Found)  . RENAL ANGIOGRAM Right 07/04/2014   Procedure: RENAL ANGIOGRAM;  Surgeon: Lorretta Harp, MD;  Location: [facility] CATH LAB;  Service: Cardiovascular;  Laterality: Right;  . RENAL ANGIOGRAM Right 08/22/2014   Procedure: RENAL ANGIOGRAM;  Surgeon: Lorretta Harp, MD;  Location: [facility] CATH LAB;  Service: Cardiovascular;  Laterality: Right;  . TONSILLECTOMY     as a child  . VIDEO BRONCHOSCOPY WITH ENDOBRONCHIAL NAVIGATION N/A 09/02/2018   Procedure: VIDEO BRONCHOSCOPY WITH ENDOBRONCHIAL NAVIGATION;  Surgeon: [name], MD;  Location: [facility] OR;  Service: Thoracic;  Laterality: N/A;    Family History  Problem Relation Age of  Onset  . Heart disease Mother        MI @ 41, died at 44  . Cancer Father   . Lung cancer Father   . Heart disease Maternal Grandmother   . Lung cancer Sister   . Breast cancer Paternal Aunt     Social History   Tobacco Use  . Smoking status: Current Every Day Smoker    Packs/day: 0.50    Years: 40.00    Pack years: 20.00    Types: Cigarettes  . Smokeless tobacco: Never Used  . Tobacco comment: as of 09/14/2018, less than 1 pack/day  Vaping Use  . Vaping Use: Never used  Substance Use Topics  . Alcohol use: Yes    Comment: occasionally  . Drug use: No    Prior to Admission medications   Medication Sig Start Date End Date Taking? Authorizing Provider  albuterol (VENTOLIN HFA) 108 (90 Base) MCG/ACT inhaler Inhale 2 puffs into the lungs every 6 (six) hours as needed for wheezing or shortness of breath. 02/02/19   [name], MD  aspirin 81 MG tablet Take 81 mg by mouth daily.    [provider]  Calcium-Magnesium-Vitamin D (CALCIUM 1200+D3 PO) Take 1 tablet by mouth daily.    [provider]  doxazosin (CARDURA) 8 MG tablet Take 8 mg by mouth daily.     [provider]  furosemide (LASIX) 20 MG tablet Take 1 tablet (20 mg total) by mouth daily. 07/13/18 03/03/20  Kayleen Memos, DO  ibandronate (BONIVA) 150 MG tablet Take 150 mg by mouth every 30 (thirty) days. 01/19/20   [provider]  levothyroxine (SYNTHROID) 50 MCG tablet Take 50 mcg by mouth daily. 12/08/19   [provider]  metoprolol tartrate (LOPRESSOR) 25 MG tablet Take 25 mg by mouth daily. 02/19/20   [provider]  moxifloxacin (VIGAMOX) 0.5 % ophthalmic solution Place 1 drop into the left eye 4 (four) times daily. 01/26/20   [provider]  Multiple Vitamins-Minerals (MULTIVITAMIN WITH MINERALS) tablet Take 1 tablet by mouth daily.     [provider]  Naphazoline HCl (CLEAR EYES OP) Place 2 drops into both eyes daily.    [provider]  pantoprazole (PROTONIX) 40 MG tablet Take 1 tablet (40 mg total) by mouth daily. 07/14/18   Irene Pap N, DO  PROLENSA 0.07 % SOLN Place 1 drop into the left eye daily. 01/26/20   [provider]  rosuvastatin (CRESTOR) 10 MG tablet Take 10 mg by mouth daily.     [provider]  tamoxifen (NOLVADEX) 20 MG tablet Take 1 tablet (20 mg total) by mouth daily. 01/25/20   Nicholas Lose, MD  Tiotropium Bromide-Olodaterol (STIOLTO RESPIMAT) 2.5-2.5 MCG/ACT AERS Inhale 2 puffs into the lungs daily. 10/27/19   [name], MD  Allergies Patient has no known allergies.   REVIEW OF SYSTEMS  Negative except as noted here or in the History of Present Illness.   PHYSICAL EXAMINATION  Initial Vital Signs Blood pressure (!) 181/73, pulse (!) 102, temperature 98.1 F (36.7 C), temperature source Oral, resp. rate 16, SpO2 (!) 85 %.  Examination General: Well-developed, well-nourished female in no acute distress; appearance consistent with age of record HENT: normocephalic; atraumatic; dual Rhino balloons in left naris; blood soaked gauze and clots in right naris Eyes: pupils equal, round and reactive to light; extraocular muscles intact Neck: supple Heart: regular rate and rhythm Lungs: clear to auscultation bilaterally Abdomen: soft; nondistended; nontender; bowel sounds present Extremities: No deformity; full range of motion Neurologic: Awake, alert and oriented; motor function intact in all extremities and symmetric; no facial droop Skin: Warm and dry Psychiatric: Normal mood and affect   RESULTS  Summary of this visit's results, reviewed and interpreted by myself:   EKG Interpretation  Date/Time:    Ventricular Rate:    PR Interval:    QRS Duration:   QT Interval:    QTC Calculation:   R Axis:     Text Interpretation:        Laboratory Studies: No results found for this or any previous visit (from the past 24 hour(s)). Imaging Studies: No results  found.  ED COURSE and MDM  Nursing notes, initial and subsequent vitals signs, including pulse oximetry, reviewed and interpreted by myself.  Vitals:   03/07/20 0213 03/07/20 0409  BP: (!) 184/88 (!) 181/73  Pulse: 100 (!) 102  Resp: 18 16  Temp: 98.1 F (36.7 C)   TempSrc: Oral   SpO2: 90% (!) 85%   Medications  cephALEXin (KEFLEX) capsule 500 mg (has no administration in time range)  oxymetazoline (AFRIN) 0.05 % nasal spray 3 spray (3 sprays Each Nare Given 03/07/20 0454)   6:33 AM Patient remains hemostatic after Rapid Rhino placement.  We will refer patient to ENT as she needs specialist follow-up given her recurrent bleeding.  Will place on Keflex for infection prophylaxis pending follow-up.  PROCEDURES  Procedures  Gauze was manually removed from the patient's right naris and then clots removed with suction and bayonet forceps.  Some light bleeding continued.  Air was deflated from the balloons of the 2 Rapid Rhinos in the patient's left naris.  The Rapid Rhino balloons were then removed without difficulty.  The patient tolerated this well and there were no immediate complications.  There was no significant immediate bleeding noted.  Bilateral Afrin drops were then ordered following the above.  6:04 AM Due to continued bilateral bleeding despite instillation of Afrin the decision was made to pack both nares.  After informed verbal consent was obtained two 4.5 cm anterior Rapid Rhino balloons were soaked in sterile saline.  One each was then inserted into the left and right nares, respectively, and inflated to about 6 cm of air.  The patient tolerated this well and there were no immediate complications.  ED DIAGNOSES     ICD-10-CM   1. Epistaxis, recurrent  R04.0        Korene Dula, MD 03/07/20 [phone]

## 2020-03-07 NOTE — ED Notes (Signed)
Called [employer] for patient.

## 2020-03-10 DIAGNOSIS — R040 Epistaxis: Secondary | ICD-10-CM | POA: Diagnosis not present

## 2020-03-13 ENCOUNTER — Inpatient Hospital Stay (HOSPITAL_COMMUNITY)
Admission: EM | Admit: 2020-03-13 | Discharge: 2020-03-18 | DRG: 151 | Disposition: A | Discharge status: Skilled Nursing Facility | Payer: Medicare Other | Attending: Internal Medicine | Admitting: Internal Medicine

## 2020-03-13 ENCOUNTER — Other Ambulatory Visit: Payer: Self-pay

## 2020-03-13 ENCOUNTER — Encounter (HOSPITAL_COMMUNITY): Payer: Self-pay | Admitting: Emergency Medicine

## 2020-03-13 ENCOUNTER — Emergency Department (HOSPITAL_COMMUNITY): Payer: Medicare Other

## 2020-03-13 DIAGNOSIS — R0602 Shortness of breath: Secondary | ICD-10-CM | POA: Diagnosis not present

## 2020-03-13 DIAGNOSIS — D649 Anemia, unspecified: Secondary | ICD-10-CM | POA: Diagnosis not present

## 2020-03-13 DIAGNOSIS — I10 Essential (primary) hypertension: Secondary | ICD-10-CM | POA: Insufficient documentation

## 2020-03-13 DIAGNOSIS — I509 Heart failure, unspecified: Secondary | ICD-10-CM | POA: Diagnosis present

## 2020-03-13 DIAGNOSIS — C3491 Malignant neoplasm of unspecified part of right bronchus or lung: Secondary | ICD-10-CM | POA: Diagnosis not present

## 2020-03-13 DIAGNOSIS — F17210 Nicotine dependence, cigarettes, uncomplicated: Secondary | ICD-10-CM | POA: Diagnosis present

## 2020-03-13 DIAGNOSIS — E871 Hypo-osmolality and hyponatremia: Secondary | ICD-10-CM | POA: Diagnosis not present

## 2020-03-13 DIAGNOSIS — Z8249 Family history of ischemic heart disease and other diseases of the circulatory system: Secondary | ICD-10-CM

## 2020-03-13 DIAGNOSIS — T39015A Adverse effect of aspirin, initial encounter: Secondary | ICD-10-CM

## 2020-03-13 DIAGNOSIS — R531 Weakness: Secondary | ICD-10-CM

## 2020-03-13 DIAGNOSIS — Z681 Body mass index (BMI) 19 or less, adult: Secondary | ICD-10-CM

## 2020-03-13 DIAGNOSIS — R040 Epistaxis: Secondary | ICD-10-CM | POA: Diagnosis not present

## 2020-03-13 DIAGNOSIS — F17200 Nicotine dependence, unspecified, uncomplicated: Secondary | ICD-10-CM | POA: Insufficient documentation

## 2020-03-13 DIAGNOSIS — Z923 Personal history of irradiation: Secondary | ICD-10-CM

## 2020-03-13 DIAGNOSIS — I110 Hypertensive heart disease with heart failure: Secondary | ICD-10-CM | POA: Diagnosis present

## 2020-03-13 DIAGNOSIS — E039 Hypothyroidism, unspecified: Secondary | ICD-10-CM | POA: Diagnosis present

## 2020-03-13 DIAGNOSIS — C3492 Malignant neoplasm of unspecified part of left bronchus or lung: Secondary | ICD-10-CM | POA: Diagnosis not present

## 2020-03-13 DIAGNOSIS — R42 Dizziness and giddiness: Secondary | ICD-10-CM | POA: Diagnosis not present

## 2020-03-13 DIAGNOSIS — I959 Hypotension, unspecified: Secondary | ICD-10-CM | POA: Diagnosis not present

## 2020-03-13 DIAGNOSIS — J9611 Chronic respiratory failure with hypoxia: Secondary | ICD-10-CM | POA: Diagnosis present

## 2020-03-13 DIAGNOSIS — E222 Syndrome of inappropriate secretion of antidiuretic hormone: Secondary | ICD-10-CM | POA: Diagnosis present

## 2020-03-13 DIAGNOSIS — J449 Chronic obstructive pulmonary disease, unspecified: Secondary | ICD-10-CM | POA: Diagnosis present

## 2020-03-13 DIAGNOSIS — Z803 Family history of malignant neoplasm of breast: Secondary | ICD-10-CM

## 2020-03-13 DIAGNOSIS — Z9981 Dependence on supplemental oxygen: Secondary | ICD-10-CM

## 2020-03-13 DIAGNOSIS — D62 Acute posthemorrhagic anemia: Secondary | ICD-10-CM | POA: Diagnosis not present

## 2020-03-13 DIAGNOSIS — Z853 Personal history of malignant neoplasm of breast: Secondary | ICD-10-CM

## 2020-03-13 DIAGNOSIS — D691 Qualitative platelet defects: Secondary | ICD-10-CM | POA: Diagnosis not present

## 2020-03-13 DIAGNOSIS — R0902 Hypoxemia: Secondary | ICD-10-CM | POA: Diagnosis not present

## 2020-03-13 DIAGNOSIS — E785 Hyperlipidemia, unspecified: Secondary | ICD-10-CM | POA: Diagnosis present

## 2020-03-13 DIAGNOSIS — E876 Hypokalemia: Secondary | ICD-10-CM | POA: Diagnosis not present

## 2020-03-13 DIAGNOSIS — R609 Edema, unspecified: Secondary | ICD-10-CM | POA: Diagnosis not present

## 2020-03-13 DIAGNOSIS — E870 Hyperosmolality and hypernatremia: Secondary | ICD-10-CM | POA: Diagnosis not present

## 2020-03-13 DIAGNOSIS — Z9012 Acquired absence of left breast and nipple: Secondary | ICD-10-CM

## 2020-03-13 DIAGNOSIS — Z20822 Contact with and (suspected) exposure to covid-19: Secondary | ICD-10-CM | POA: Diagnosis not present

## 2020-03-13 DIAGNOSIS — R636 Underweight: Secondary | ICD-10-CM | POA: Diagnosis present

## 2020-03-13 DIAGNOSIS — I701 Atherosclerosis of renal artery: Secondary | ICD-10-CM | POA: Diagnosis present

## 2020-03-13 LAB — BASIC METABOLIC PANEL
Anion gap: 13 (ref 5–15)
Anion gap: 8 (ref 5–15)
Anion gap: 8 (ref 5–15)
Anion gap: 6 (ref 5–15)
BUN: 9 mg/dL (ref 8–23)
BUN: 12 mg/dL (ref 8–23)
BUN: 12 mg/dL (ref 8–23)
BUN: 9 mg/dL (ref 8–23)
CO2: 24 mmol/L (ref 22–32)
CO2: 25 mmol/L (ref 22–32)
CO2: 25 mmol/L (ref 22–32)
CO2: 23 mmol/L (ref 22–32)
Calcium: 8.3 mg/dL — ABNORMAL LOW (ref 8.9–10.3)
Calcium: 7.4 mg/dL — ABNORMAL LOW (ref 8.9–10.3)
Calcium: 7.5 mg/dL — ABNORMAL LOW (ref 8.9–10.3)
Calcium: 6.7 mg/dL — ABNORMAL LOW (ref 8.9–10.3)
Chloride: 81 mmol/L — ABNORMAL LOW (ref 98–111)
Chloride: 88 mmol/L — ABNORMAL LOW (ref 98–111)
Chloride: 90 mmol/L — ABNORMAL LOW (ref 98–111)
Chloride: 100 mmol/L (ref 98–111)
Creatinine, Ser: 0.68 mg/dL (ref 0.44–1.00)
Creatinine, Ser: 0.56 mg/dL (ref 0.44–1.00)
Creatinine, Ser: 0.56 mg/dL (ref 0.44–1.00)
Creatinine, Ser: 0.44 mg/dL (ref 0.44–1.00)
GFR calc Af Amer: >60 mL/min (ref >60)
GFR calc Af Amer: >60 mL/min (ref >60)
GFR calc Af Amer: >60 mL/min (ref >60)
GFR calc Af Amer: >60 mL/min (ref >60)
GFR calc non Af Amer: >60 mL/min (ref >60)
GFR calc non Af Amer: >60 mL/min (ref >60)
GFR calc non Af Amer: >60 mL/min (ref >60)
GFR calc non Af Amer: >60 mL/min (ref >60)
Glucose, Bld: 109 mg/dL — ABNORMAL HIGH (ref 70–99)
Glucose, Bld: 99 mg/dL (ref 70–99)
Glucose, Bld: 98 mg/dL (ref 70–99)
Glucose, Bld: 81 mg/dL (ref 70–99)
Potassium: 3.8 mmol/L (ref 3.5–5.1)
Potassium: 3.5 mmol/L (ref 3.5–5.1)
Potassium: 3.7 mmol/L (ref 3.5–5.1)
Potassium: 3.2 mmol/L — ABNORMAL LOW (ref 3.5–5.1)
Sodium: 118 mmol/L — CL (ref 135–145)
Sodium: 121 mmol/L — ABNORMAL LOW (ref 135–145)
Sodium: 123 mmol/L — ABNORMAL LOW (ref 135–145)
Sodium: 129 mmol/L — ABNORMAL LOW (ref 135–145)

## 2020-03-13 LAB — CBC WITH DIFFERENTIAL/PLATELET
Abs Immature Granulocytes: 0.09 10*3/uL — ABNORMAL HIGH (ref 0.00–0.07)
Basophils Absolute: 0 10*3/uL (ref 0.0–0.1)
Basophils Relative: 0 %
Eosinophils Absolute: 0.1 10*3/uL (ref 0.0–0.5)
Eosinophils Relative: 1 %
HCT: 20.3 % — ABNORMAL LOW (ref 36.0–46.0)
Hemoglobin: 7.1 g/dL — ABNORMAL LOW (ref 12.0–15.0)
Immature Granulocytes: 1 %
Lymphocytes Relative: 10 %
Lymphs Abs: 1 10*3/uL (ref 0.7–4.0)
MCH: 32 pg (ref 26.0–34.0)
MCHC: 35 g/dL (ref 30.0–36.0)
MCV: 91.4 fL (ref 80.0–100.0)
Monocytes Absolute: 0.9 10*3/uL (ref 0.1–1.0)
Monocytes Relative: 9 %
Neutro Abs: 7.6 10*3/uL (ref 1.7–7.7)
Neutrophils Relative %: 79 %
Platelets: 312 10*3/uL (ref 150–400)
RBC: 2.22 MIL/uL — ABNORMAL LOW (ref 3.87–5.11)
RDW: 15.6 % — ABNORMAL HIGH (ref 11.5–15.5)
WBC: 9.7 10*3/uL (ref 4.0–10.5)
nRBC: 0 % (ref 0.0–0.2)

## 2020-03-13 LAB — BLOOD GAS, ARTERIAL
Acid-Base Excess: 1.9 mmol/L (ref 0.0–2.0)
Bicarbonate: 27.6 mmol/L (ref 20.0–28.0)
FIO2: 30
O2 Saturation: 89.8 %
Patient temperature: 98.6
pCO2 arterial: 51.3 mmHg — ABNORMAL HIGH (ref 32.0–48.0)
pH, Arterial: 7.351 (ref 7.350–7.450)
pO2, Arterial: 59.2 mmHg — ABNORMAL LOW (ref 83.0–108.0)

## 2020-03-13 LAB — URINALYSIS, ROUTINE W REFLEX MICROSCOPIC
Bilirubin Urine: NEGATIVE
Glucose, UA: NEGATIVE mg/dL
Hgb urine dipstick: NEGATIVE
Ketones, ur: NEGATIVE mg/dL
Leukocytes,Ua: NEGATIVE
Nitrite: NEGATIVE
Protein, ur: NEGATIVE mg/dL
Specific Gravity, Urine: 1.006 (ref 1.005–1.030)
pH: 6 (ref 5.0–8.0)

## 2020-03-13 LAB — HEMOGLOBIN AND HEMATOCRIT, BLOOD
HCT: 19 % — ABNORMAL LOW (ref 36.0–46.0)
HCT: 28.2 % — ABNORMAL LOW (ref 36.0–46.0)
Hemoglobin: 6.4 g/dL — CL (ref 12.0–15.0)
Hemoglobin: 9.7 g/dL — ABNORMAL LOW (ref 12.0–15.0)

## 2020-03-13 LAB — SODIUM, URINE, RANDOM: Sodium, Ur: <10 mmol/L

## 2020-03-13 LAB — SARS CORONAVIRUS 2 BY RT PCR (HOSPITAL ORDER, PERFORMED IN ~~LOC~~ HOSPITAL LAB): SARS Coronavirus 2: NEGATIVE

## 2020-03-13 LAB — BRAIN NATRIURETIC PEPTIDE: B Natriuretic Peptide: 317.8 pg/mL — ABNORMAL HIGH (ref 0.0–100.0)

## 2020-03-13 LAB — PREPARE RBC (CROSSMATCH)

## 2020-03-13 LAB — OSMOLALITY, URINE: Osmolality, Ur: 192 mOsm/kg — ABNORMAL LOW (ref 300–900)

## 2020-03-13 LAB — TROPONIN I (HIGH SENSITIVITY): Troponin I (High Sensitivity): 29 ng/L — ABNORMAL HIGH (ref <18)

## 2020-03-13 MED ORDER — ALBUTEROL SULFATE HFA 108 (90 BASE) MCG/ACT IN AERS: 2.0000 | INHALATION_SPRAY | Freq: Four times a day (QID) | RESPIRATORY_TRACT | Status: DC | PRN

## 2020-03-13 MED ORDER — TAMOXIFEN CITRATE 10 MG PO TABS
20.0000 mg | ORAL_TABLET | Freq: Every day | ORAL | Status: DC
  Administered 2020-03-13 – 2020-03-18 (×6): 20 mg via ORAL
  Filled 2020-03-13 (×7): qty 2

## 2020-03-13 MED ORDER — ACETAMINOPHEN 650 MG RE SUPP: 650.0000 mg | Freq: Four times a day (QID) | RECTAL | Status: DC | PRN

## 2020-03-13 MED ORDER — LEVOTHYROXINE SODIUM 50 MCG PO TABS
50.0000 ug | ORAL_TABLET | Freq: Every day | ORAL | Status: DC
  Administered 2020-03-13 – 2020-03-18 (×6): 50 ug via ORAL
  Filled 2020-03-13 (×6): qty 1

## 2020-03-13 MED ORDER — SODIUM CHLORIDE 0.9 % IV SOLN
10.0000 mL/h | Freq: Once | INTRAVENOUS | Status: AC
  Administered 2020-03-13: 10 mL/h via INTRAVENOUS

## 2020-03-13 MED ORDER — ONDANSETRON HCL 4 MG PO TABS: 4.0000 mg | ORAL_TABLET | Freq: Four times a day (QID) | ORAL | Status: DC | PRN

## 2020-03-13 MED ORDER — ADULT MULTIVITAMIN W/MINERALS CH
1.0000 | ORAL_TABLET | Freq: Every day | ORAL | Status: DC
  Administered 2020-03-13 – 2020-03-18 (×6): 1 via ORAL
  Filled 2020-03-13 (×6): qty 1

## 2020-03-13 MED ORDER — ROSUVASTATIN CALCIUM 10 MG PO TABS
10.0000 mg | ORAL_TABLET | Freq: Every day | ORAL | Status: DC
  Administered 2020-03-13 – 2020-03-18 (×6): 10 mg via ORAL
  Filled 2020-03-13 (×6): qty 1

## 2020-03-13 MED ORDER — UMECLIDINIUM BROMIDE 62.5 MCG/INH IN AEPB
1.0000 | INHALATION_SPRAY | Freq: Every day | RESPIRATORY_TRACT | Status: DC
  Administered 2020-03-13 – 2020-03-18 (×5): 1 via RESPIRATORY_TRACT
  Filled 2020-03-13 (×2): qty 7

## 2020-03-13 MED ORDER — LIDOCAINE-EPINEPHRINE (PF) 2 %-1:200000 IJ SOLN
INTRAMUSCULAR | Status: AC
  Filled 2020-03-13: qty 20

## 2020-03-13 MED ORDER — PANTOPRAZOLE SODIUM 40 MG PO TBEC
40.0000 mg | DELAYED_RELEASE_TABLET | Freq: Every day | ORAL | Status: DC
  Administered 2020-03-13 – 2020-03-18 (×6): 40 mg via ORAL
  Filled 2020-03-13 (×6): qty 1

## 2020-03-13 MED ORDER — ARFORMOTEROL TARTRATE 15 MCG/2ML IN NEBU
15.0000 ug | INHALATION_SOLUTION | Freq: Two times a day (BID) | RESPIRATORY_TRACT | Status: DC
  Administered 2020-03-14 – 2020-03-18 (×9): 15 ug via RESPIRATORY_TRACT
  Filled 2020-03-13 (×13): qty 2

## 2020-03-13 MED ORDER — DOXAZOSIN MESYLATE 8 MG PO TABS
8.0000 mg | ORAL_TABLET | Freq: Every day | ORAL | Status: DC
  Administered 2020-03-13 – 2020-03-18 (×6): 8 mg via ORAL
  Filled 2020-03-13 (×2): qty 1
  Filled 2020-03-13: qty 4
  Filled 2020-03-13: qty 1
  Filled 2020-03-13: qty 4
  Filled 2020-03-13: qty 1

## 2020-03-13 MED ORDER — SODIUM CHLORIDE 0.9 % IV BOLUS (SEPSIS)
500.0000 mL | Freq: Once | INTRAVENOUS | Status: AC
  Administered 2020-03-13: 500 mL via INTRAVENOUS

## 2020-03-13 MED ORDER — METOPROLOL TARTRATE 25 MG PO TABS
25.0000 mg | ORAL_TABLET | Freq: Every day | ORAL | Status: DC
  Administered 2020-03-13 – 2020-03-18 (×6): 25 mg via ORAL
  Filled 2020-03-13 (×6): qty 1

## 2020-03-13 MED ORDER — CEPHALEXIN 500 MG PO CAPS
500.0000 mg | ORAL_CAPSULE | Freq: Four times a day (QID) | ORAL | Status: DC
  Administered 2020-03-13 – 2020-03-17 (×17): 500 mg via ORAL
  Filled 2020-03-13 (×17): qty 1

## 2020-03-13 MED ORDER — ONDANSETRON HCL 4 MG/2ML IJ SOLN: 4.0000 mg | Freq: Four times a day (QID) | INTRAMUSCULAR | Status: DC | PRN

## 2020-03-13 MED ORDER — HYDRALAZINE HCL 20 MG/ML IJ SOLN: 10.0000 mg | Freq: Four times a day (QID) | INTRAMUSCULAR | Status: DC | PRN

## 2020-03-13 MED ORDER — OXYMETAZOLINE HCL 0.05 % NA SOLN
1.0000 | Freq: Once | NASAL | Status: AC
  Administered 2020-03-13: 1 via NASAL
  Filled 2020-03-13: qty 30

## 2020-03-13 MED ORDER — ALBUTEROL SULFATE (2.5 MG/3ML) 0.083% IN NEBU: 2.5000 mg | INHALATION_SOLUTION | Freq: Four times a day (QID) | RESPIRATORY_TRACT | Status: DC | PRN

## 2020-03-13 MED ORDER — SODIUM CHLORIDE 0.9 % IV SOLN: INTRAVENOUS | Status: DC

## 2020-03-13 MED ORDER — ACETAMINOPHEN 325 MG PO TABS: 650.0000 mg | ORAL_TABLET | Freq: Four times a day (QID) | ORAL | Status: DC | PRN

## 2020-03-13 NOTE — ED Notes (Signed)
RN attempted to take off the simple mask, patient steadily declined in O2 saturation to 87% on RA. Patient placed back on simple mask as she cannot have a nasal cannula due to placement of rhino rocket.

## 2020-03-13 NOTE — H&P (Signed)
History and Physical    Kendra Watts:[id number] DOB: [birth year] DOA: 03/13/2020  PCP: Georgianne Fick, MD  Patient coming from: Home  I have personally briefly reviewed patient's old medical records in [facility]  Chief Complaint: Generalized weakness, epistaxis  HPI: Kendra Watts is a 74 y.o. 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  COPD with 2-4L O2 requirement at baseline.  Pt presents to the ED with ongoing episodes of epistaxis.  First seen in ED on 8/13 with epistaxis. Both nares packed.  Reoccurred 8/17.  She reports it returned tonight.  She also reports increasing weakness and shortness of breath.  She also reports increased cough but no hemoptysis.  No active chest pain.  No fevers or vomiting.  She is a smoker.  Has had COVID vaccines.  ED Course: HGB 7.1 down from 10.6 on 8/13.  Sodium 118 down from 133 on 8/13.   Review of Systems: As per HPI, otherwise all review of systems negative.  Past Medical History:  Diagnosis Date  . Anemia   . 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 Breast cancer (HCC) 09/22/2015   Malignant  . Breast cancer of upper-outer quadrant of left female breast (HCC) 09/08/2015  . Cataract, immature   . CHF (congestive heart failure) (HCC)    Acute CHF-06/2018  . COPD (chronic obstructive pulmonary disease) (HCC)   . Heart murmur    states no known problems  . History of hyperthyroidism   . Hyperlipidemia   . Hypertension    states under control with meds., has been on med. x "long time"  . Hypokalemia    from last physical.   . Hypothyroidism   . Nonfunctioning kidney    left  . Personal history of radiation therapy    2017  . Pulmonary nodules    Bilateral  . Radiation  10/30/15-11/28/15   left breast 42.72 Gy, boosted to 10 Gy  . Renal artery stenosis (HCC)   . Tobacco abuse   . Wears partial dentures    upper and lower    Past Surgical History:  Procedure Laterality Date  . ABDOMINAL ANGIOGRAM  02/18/2012   Procedure: ABDOMINAL ANGIOGRAM;  Surgeon: Runell Gess, MD;  Location: [facility] CATH LAB;  Service: Cardiovascular;;  . ABDOMINAL AORTAGRAM  07/04/2014  . ABDOMINAL HYSTERECTOMY  ~ 1977   partial  . APPENDECTOMY    . ARCH AORTOGRAM    . BREAST LUMPECTOMY Left 09/22/2015   Malignant  . CAROTID ANGIOGRAM N/A 02/18/2012   Procedure: CAROTID ANGIOGRAM;  Surgeon: Runell Gess, MD;  Location: [facility] CATH LAB;  Service: Cardiovascular;  Laterality: N/A;  . ENDARTERECTOMY  04/02/2012   Procedure: ENDARTERECTOMY CAROTID;  Surgeon: Nada Libman, MD;  Location: [facility] OR;  Service: Vascular;  Laterality: Right;  . FUDUCIAL PLACEMENT Bilateral 09/02/2018   Procedure: Placement Of Fiducial to right upper lobe & left upper lobe lung;  Surgeon: Leslye Peer, MD;  Location: [facility] OR;  Service: Thoracic;  Laterality: Bilateral;  . IR THORACENTESIS ASP PLEURAL SPACE W/IMG GUIDE  07/08/2018  . RADIOACTIVE SEED GUIDED PARTIAL MASTECTOMY WITH AXILLARY SENTINEL LYMPH NODE BIOPSY Left 09/22/2015   Procedure: INJECT BLUE DYE LEFT BREAST,RADIOACTIVE SEED GUIDED PARTIAL MASTECTOMY WITH AXILLARY SENTINEL  LYMPH NODE BIOPSY;  Surgeon: Fanny Skates, MD;  Location: [facility];  Service: General;  Laterality: Left;  . RENAL ANGIOGRAM Left 06/08/2010   renal artery stent -  5x12 Genesis on Aviator balloon stent (Dr. Adora Fridge)  . RENAL ANGIOGRAM Right 07/04/2014   Procedure: RENAL ANGIOGRAM;  Surgeon: Lorretta Harp, MD;  Location: [facility] CATH LAB;  Service: Cardiovascular;  Laterality: Right;  . RENAL ANGIOGRAM Right 08/22/2014   Procedure: RENAL ANGIOGRAM;  Surgeon: Lorretta Harp, MD;  Location: [facility] CATH LAB;  Service: Cardiovascular;  Laterality: Right;  . TONSILLECTOMY      as a child  . VIDEO BRONCHOSCOPY WITH ENDOBRONCHIAL NAVIGATION N/A 09/02/2018   Procedure: VIDEO BRONCHOSCOPY WITH ENDOBRONCHIAL NAVIGATION;  Surgeon: [name], MD;  Location: [place];  Service: Thoracic;  Laterality: N/A;     reports that she has been smoking cigarettes. She has a 20.00 pack-year smoking history. She has never used smokeless tobacco. She reports current alcohol use. She reports that she does not use drugs.  No Known Allergies  Family History  Problem Relation Age of Onset  . Heart disease Mother        MI @ 33, died at 78  . Cancer Father   . Lung cancer Father   . Heart disease Maternal Grandmother   . Lung cancer Sister   . Breast cancer Paternal Aunt      Prior to Admission medications   Medication Sig Start Date End Date Taking? Authorizing Provider  albuterol (VENTOLIN HFA) 108 (90 Base) MCG/ACT inhaler Inhale 2 puffs into the lungs every 6 (six) hours as needed for wheezing or shortness of breath. 02/02/19  Yes [name], MD  aspirin 81 MG tablet Take 81 mg by mouth daily.   Yes [provider]  Calcium-Magnesium-Vitamin D (CALCIUM 1200+D3 PO) Take 1 tablet by mouth daily.   Yes [provider]  cephALEXin (KEFLEX) 500 MG capsule Take 1 capsule (500 mg total) by mouth 4 (four) times daily. 03/07/20  Yes Molpus, John, MD  doxazosin (CARDURA) 8 MG tablet Take 8 mg by mouth daily.    Yes [provider]  furosemide (LASIX) 20 MG tablet Take 1 tablet (20 mg total) by mouth daily. 07/13/18 03/13/20 Yes Kayleen Memos, DO  levothyroxine (SYNTHROID) 50 MCG tablet Take 50 mcg by mouth daily. 12/08/19  Yes [provider]  metoprolol tartrate (LOPRESSOR) 25 MG tablet Take 25 mg by mouth daily. 02/19/20  Yes [provider]  Multiple Vitamins-Minerals (MULTIVITAMIN WITH MINERALS) tablet Take 1 tablet by mouth daily.    Yes [provider]  pantoprazole (PROTONIX) 40 MG tablet Take 1 tablet (40 mg total) by  mouth daily. 07/14/18  Yes Irene Pap N, DO  rosuvastatin (CRESTOR) 10 MG tablet Take 10 mg by mouth daily.    Yes [provider]  tamoxifen (NOLVADEX) 20 MG tablet Take 1 tablet (20 mg total) by mouth daily. 01/25/20  Yes Nicholas Lose, MD  Tiotropium Bromide-Olodaterol (STIOLTO RESPIMAT) 2.5-2.5 MCG/ACT AERS Inhale 2 puffs into the lungs daily. 10/27/19  Yes [name], MD  ibandronate (BONIVA) 150 MG tablet Take 150 mg by mouth every 30 (thirty) days. 01/19/20   [provider]    Physical Exam: Vitals:   03/13/20 0330 03/13/20 0335 03/13/20 0345 03/13/20 0415  BP: (!) 143/79 (!) 143/79 132/84 132/81  Pulse: 84 84 88 85  Resp: 13 18 (!) 26 16  Temp:  TempSrc:      SpO2: 100% 100% 92% 98%  Weight:      Height:        Constitutional: NAD, calm, comfortable Eyes: PERRL, lids and conjunctivae normal ENMT: Ongoing nose bleeding. Neck: normal, supple, no masses, no thyromegaly Respiratory: clear to auscultation bilaterally, no wheezing, no crackles. Normal respiratory effort. No accessory muscle use.  Cardiovascular: Regular rate and rhythm, no murmurs / rubs / gallops. No extremity edema. 2+ pedal pulses. No carotid bruits.  Abdomen: no tenderness, no masses palpated. No hepatosplenomegaly. Bowel sounds positive.  Musculoskeletal: no clubbing / cyanosis. No joint deformity upper and lower extremities. Good ROM, no contractures. Normal muscle tone.  Skin: no rashes, lesions, ulcers. No induration Neurologic: CN 2-12 grossly intact. Sensation intact, DTR normal. Strength 5/5 in all 4.  Psychiatric: Normal judgment and insight. Alert and oriented x 3. Normal mood.    Labs on Admission: I have personally reviewed following labs and imaging studies  CBC: Recent Labs  Lab 03/13/20 0222  WBC 9.7  NEUTROABS 7.6  HGB 7.1*  HCT 20.3*  MCV 91.4  PLT 312   Basic Metabolic Panel: Recent Labs  Lab 03/13/20 0222  NA 118*  K 3.8  CL 81*  CO2 24  GLUCOSE  109*  BUN 9  CREATININE 0.68  CALCIUM 8.3*   GFR: Estimated Creatinine Clearance: 40.3 mL/min (by C-G formula based on SCr of 0.68 mg/dL). Liver Function Tests: No results for input(s): AST, ALT, ALKPHOS, BILITOT, PROT, ALBUMIN in the last 168 hours. No results for input(s): LIPASE, AMYLASE in the last 168 hours. No results for input(s): AMMONIA in the last 168 hours. Coagulation Profile: No results for input(s): INR, PROTIME in the last 168 hours. Cardiac Enzymes: No results for input(s): CKTOTAL, CKMB, CKMBINDEX, TROPONINI in the last 168 hours. BNP (last 3 results) No results for input(s): PROBNP in the last 8760 hours. HbA1C: No results for input(s): HGBA1C in the last 72 hours. CBG: No results for input(s): GLUCAP in the last 168 hours. Lipid Profile: No results for input(s): CHOL, HDL, LDLCALC, TRIG, CHOLHDL, LDLDIRECT in the last 72 hours. Thyroid Function Tests: No results for input(s): TSH, T4TOTAL, FREET4, T3FREE, THYROIDAB in the last 72 hours. Anemia Panel: No results for input(s): VITAMINB12, FOLATE, FERRITIN, TIBC, IRON, RETICCTPCT in the last 72 hours. Urine analysis:    Component Value Date/Time   COLORURINE YELLOW 03/27/2012 1119   APPEARANCEUR CLEAR 03/27/2012 1119   LABSPEC 1.024 03/27/2012 1119   PHURINE 5.5 03/27/2012 1119   GLUCOSEU NEGATIVE 03/27/2012 1119   HGBUR NEGATIVE 03/27/2012 1119   BILIRUBINUR NEGATIVE 03/27/2012 1119   KETONESUR 15 (A) 03/27/2012 1119   PROTEINUR NEGATIVE 03/27/2012 1119   UROBILINOGEN 1.0 03/27/2012 1119   NITRITE NEGATIVE 03/27/2012 1119   LEUKOCYTESUR MODERATE (A) 03/27/2012 1119    Radiological Exams on Admission: DG Chest Port 1 View  Result Date: 03/13/2020 CLINICAL DATA:  Shortness of breath EXAM: PORTABLE CHEST 1 VIEW COMPARISON:  None. FINDINGS: The heart size and mediastinal contours are within normal limits. Both lungs are clear. The visualized skeletal structures are unremarkable. Biapical surgical clips.  IMPRESSION: No active disease. Electronically Signed   By: Deatra Robinson M.D.   On: 03/13/2020 02:49    EKG: Independently reviewed.  Assessment/Plan Principal Problem:   Epistaxis Active Problems:   Chronic respiratory failure with hypoxia (HCC)   Hyponatremia   Squamous cell lung cancer, left (HCC)   Adenocarcinoma, lung, right (HCC)   Acute blood loss anemia  1. Epistaxis - 1. Nose packed by EDP 2. Call ENT in AM 3. Presumably brought on by pts chronic O2 use via [place] 2. Hyponatremia - 1. DDx includes dehydration due to decreased PO intake from intermittent nose bleeding vs SIADH from lung CA 2. Pt's lung CAs have been NSCLCs in past, usually SCLC is more associated with SIADH 3. Also sodium drop is rather rapid for SIADH (was essentially normal just 10 days ago), so feel SIADH is slightly less likely. 4. Unfortunately the UA, urine sodium, and urine OSM ordered a couple of hours ago, are still pending. 5. 500cc NS bolus in ED 6. Will put pt on NS at 100cc/hr (while not getting transfused) 7. BMP Q6H 8. Hopefully the urine studies come back soon allowing [place] to formally exclude SIADH. 3. Acute blood loss anemia - 1. Due to epistaxis 2. Transfusing 2u PRBC 3. Repeat H/H at 0800 4. Chronic resp failure with hypoxia - 1. Cont O2, but switch to face mask instead of [place] 2. Cont home nebs 5. HTN - 1. Cont metoprolol 2. Hold lasix  DVT prophylaxis: SCDs Code Status: Full Family Communication: No family in room Disposition Plan: Home after hyponatremia resolved, anemia improved, and bleeding stopped Consults called: None, call ENT in AM Admission status: Place in obs    Karra Pink, [place] [facility]  How to contact the [facility] Attending or Consulting provider [place] or covering provider during after hours [place], for this patient?  1. Check the care team in [facility] and look for a) attending/consulting [facility] provider listed and b) the [facility] team listed 2. Log into  [email or web address]  Amion Physician Scheduling and messaging for groups and whole hospitals  On call and physician scheduling software for group practices, residents, hospitalists and other medical providers for call, clinic, rotation and shift schedules. OnCall [employer] is a hospital-wide system for scheduling doctors and paging doctors on call. EasyPlot is for scientific plotting and data analysis.  [email or web address]  and use [place]'s universal password to access. If you do not have the password, please contact the hospital operator.  3. Locate the [facility] provider you are looking for under [facility] and page to a number that you can be directly reached. 4. If you still have difficulty reaching the provider, please page the [facility] (Director on Call) for the Hospitalists listed on amion for assistance.  03/13/2020, 5:55 AM

## 2020-03-13 NOTE — ED Provider Notes (Signed)
.  Epistaxis Management  Date/Time: 03/13/2020 6:07 AM Performed by: Ripley Fraise, MD Authorized by: Ripley Fraise, MD   Consent:    Consent obtained:  Verbal   Consent given by:  Patient   Risks discussed:  Bleeding Anesthesia (see MAR for exact dosages):    Anesthesia method:  Topical application Procedure details:    Treatment site:  R anterior and R posterior   Repair method: rhino rocket.   Treatment complexity:  Extensive   Treatment episode: recurring   Post-procedure details:    Patient tolerance of procedure:  Tolerated well, no immediate complications Comments:     While awaiting admission, patient began to have profuse bleeding from the right nare.  Patient was having difficulty tolerating nasal cannula she is needing this for her hypoxia.  After extensive suctioning, bleeding was still significant.  I placed a 7.5 cm Rhino Rocket in the right nare.  Patient tolerated well.      Ripley Fraise, MD 03/13/20 [phone]

## 2020-03-13 NOTE — ED Triage Notes (Signed)
Pt arrived via EMS from home. Pt has been here on 8/13 and 8/17 for nosebleeds. Pt has increased weakness over the past 2 days, increased edema in her legs over past 2 days, and decreased urine output. Pt has hx of lung cancer and has rhonchi in lower right lobe. Pt also has a nosebleed. Pt's oxygen in 88 on room air.

## 2020-03-13 NOTE — Consult Note (Signed)
Subjective:     Kendra Watts is a 74 y.o. female who presents for evaluation of epistaxis.  I was called this morning by the internist while the patient still in the emergency room.  She has had a right-sided pack placed by the ER physician.  This is successfully stopped the bleeding entirely.  The patient has been seeing twice in the emergency room for nosebleeds over the past 3 weeks.  She has been packed twice but continues to bleed.  She continues to smoke despite having had lung cancer and requiring oxygen at night.  She takes aspirin daily.  On review of the chart, I cannot find a blood pressure lower than 147/98.  Most of her pressures are in the 408X and 448J systolic.    Patient History:  The following portions of the patient's history were reviewed and updated as appropriate: allergies, current medications, past family history, past medical history, past social history, past surgical history and problem list.  Review of Systems Pertinent items are noted in HPI.    Objective:    BP (!) 145/89   Pulse 96   Temp 98.4 F (36.9 C) (Oral)   Resp 18   Ht 5' (1.524 m)   Wt 40.8 kg   SpO2 100%   BMI 17.58 kg/m   General:  alert  Skin:  normal  Eyes: conjunctivae/corneas clear. PERRL, EOM's intact. Fundi benign.  Mouth: MMM no lesions  Lymph Nodes:  Cervical, supraclavicular, and axillary nodes normal.  Lungs:    Heart:    Abdomen:   CVA:    Genitourinary:   Extremities:    Neurologic:  Alert and oriented x3. Gait normal. Reflexes and motor strength normal and symmetric. Cranial nerves 2-12 and sensation grossly intact.  Psychiatric:  normal mood, behavior, speech, dress, and thought processes     's patient has no active bleeding either anteriorly or posteriorly in the nose.  Posterior pharyngeal wall is without blood.  She has a pack in place on the right side.  Assessment:   Recurrent epistaxis Hypertension Oxygen dependency Cigarette use History of lung  cancer Medication induced platelet dysfunction   Plan:   It is going to be very difficult to control this patient's recurrent epistaxis without addressing her ongoing hypertension, removing the nasal cannula oxygen from her nose, and having the patient stop smoking.  At this point I would recommend the following:  1.  Pack is in place and working well.  This should remain in place until Thursday morning at which time it can be removed.  2.  Unless medically contraindicated, I would hold her aspirin for 1 week.  3.  She is not to use nasal cannula.  This includes both in the hospital and when she is discharged.  She should be given either a nonrebreather with humidified oxygen or preferably humidified oxygen via face tent while in the hospital and upon discharge.  4.  Patient should have PO antistaph antibiotic while pack is in place  5.  Patient should stop smoking as this contributes to nasal inflammation and subsequent bleeding.  6.  It is most imperative that the patient have her hypertension better controlled.  Her systolic blood pressure should be below 135 at all times and her diastolic below 85.  This is consistent with hypertension management recommendations and will help prevent bleeding in the future.  Please remove nasal pack Thursday morning if still in the hospital.  Otherwise, she should follow-up with her primary physician  or ENT for its removal.  If an ENT appointment is needed, please contact Sherlynn Carbon at [phone].  He is coordinating all ENT follow-up for inpatients.  Reconsult as needed.

## 2020-03-13 NOTE — ED Notes (Signed)
Did not give pt her tray because she was sleeping with a non-rebreather on.

## 2020-03-13 NOTE — ED Provider Notes (Signed)
[facility] DEPT Provider Note   CSN: [id number] Arrival date & time: 03/13/20  0100     History Chief Complaint  Patient presents with  . Weakness  . Epistaxis    Kendra Watts is a 73 y.o. female.  The history is provided by the patient.  Weakness Severity:  Moderate Onset quality:  Gradual Timing:  Intermittent Progression:  Worsening Chronicity:  New Relieved by:  Rest Worsened by:  Activity Associated symptoms: shortness of breath   Associated symptoms: no chest pain and no vomiting   Epistaxis  Patient has had recent issues with epistaxis.  She reports it returned tonight.  She also reports increasing weakness and shortness of breath.  She also reports increased cough but no hemoptysis.  No active chest pain.  No fevers or vomiting.  She is a smoker She does wear oxygen at night. She is fully vaccinated for COVID-19    Past Medical History:  Diagnosis Date  . Anemia   . 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 Breast cancer ([place]) 09/22/2015   Malignant  . Breast cancer of upper-outer quadrant of left female breast ([place]) 09/08/2015  . Cataract, immature   . CHF (congestive heart failure) ([place])    Acute CHF-06/2018  . COPD (chronic obstructive pulmonary disease) ([place])   . Heart murmur    states no known problems  . History of hyperthyroidism   . Hyperlipidemia   . Hypertension    states under control with meds., has been on med. x "long time"  . Hypokalemia    from last physical.   . Hypothyroidism   . Nonfunctioning kidney    left  . Personal history of radiation therapy    2017  . Pulmonary nodules    Bilateral  . Radiation 10/30/15-11/28/15   left breast 42.72 Gy, boosted to 10 Gy  . Renal artery stenosis ([place])   . Tobacco abuse   . Wears partial dentures    upper and lower    Patient Active Problem List   Diagnosis Date Noted  . Hemoptysis 09/14/2018  . Squamous cell lung cancer, left ([place]) 09/10/2018  .  Adenocarcinoma, lung, right ([place]) 09/10/2018  . Multiple lung nodules 07/20/2018  . Chronic obstructive pulmonary disease ([place]) 07/20/2018  . Acute CHF (congestive heart failure) ([place]) 07/06/2018  . Chronic respiratory failure with hypoxia ([employer]) 07/06/2018  . Hyponatremia 07/06/2018  . Anemia 07/06/2018  . Breast cancer of upper-outer quadrant of left female breast ([place]) 09/08/2015  . Renal artery stenosis ([place]) 08/23/2014  . Right upper extremity numbness 07/01/2013  . Atherosclerotic renal artery stenosis, bilateral ([place]) 05/26/2013  . Peripheral arterial disease ([place]) 05/26/2013  . Essential hypertension 05/26/2013  . Hyperlipidemia 05/26/2013  . Tobacco abuse 05/26/2013  . Carotid artery stenosis 03/09/2012    Past Surgical History:  Procedure Laterality Date  . ABDOMINAL ANGIOGRAM  02/18/2012   Procedure: ABDOMINAL ANGIOGRAM;  Surgeon: Lorretta Harp, MD;  Location: [facility] CATH LAB;  Service: Cardiovascular;;  . ABDOMINAL AORTAGRAM  07/04/2014  . ABDOMINAL HYSTERECTOMY  ~ 1977   partial  . APPENDECTOMY    . ARCH AORTOGRAM    . BREAST LUMPECTOMY Left 09/22/2015   Malignant  . CAROTID ANGIOGRAM N/A 02/18/2012   Procedure: CAROTID ANGIOGRAM;  Surgeon: Lorretta Harp, MD;  Location: [facility] CATH LAB;  Service: Cardiovascular;  Laterality: N/A;  . ENDARTERECTOMY  04/02/2012   Procedure: ENDARTERECTOMY CAROTID;  Surgeon: Serafina Mitchell, MD;  Location:  [facility] OR;  Service: Vascular;  Laterality: Right;  . FUDUCIAL PLACEMENT Bilateral 09/02/2018   Procedure: Placement Of Fiducial to right upper lobe & left upper lobe lung;  Surgeon: [name], MD;  Location: [facility] OR;  Service: Thoracic;  Laterality: Bilateral;  . IR THORACENTESIS ASP PLEURAL SPACE W/IMG GUIDE  07/08/2018  . RADIOACTIVE SEED GUIDED PARTIAL MASTECTOMY WITH AXILLARY SENTINEL LYMPH NODE BIOPSY Left 09/22/2015   Procedure: INJECT BLUE DYE LEFT BREAST,RADIOACTIVE SEED GUIDED PARTIAL MASTECTOMY WITH AXILLARY SENTINEL LYMPH NODE  BIOPSY;  Surgeon: Fanny Skates, MD;  Location: [place];  Service: General;  Laterality: Left;  . RENAL ANGIOGRAM Left 06/08/2010   renal artery stent -  5x12 Genesis on Aviator balloon stent (Dr. Adora Fridge)  . RENAL ANGIOGRAM Right 07/04/2014   Procedure: RENAL ANGIOGRAM;  Surgeon: Lorretta Harp, MD;  Location: [facility] CATH LAB;  Service: Cardiovascular;  Laterality: Right;  . RENAL ANGIOGRAM Right 08/22/2014   Procedure: RENAL ANGIOGRAM;  Surgeon: Lorretta Harp, MD;  Location: [facility] CATH LAB;  Service: Cardiovascular;  Laterality: Right;  . TONSILLECTOMY     as a child  . VIDEO BRONCHOSCOPY WITH ENDOBRONCHIAL NAVIGATION N/A 09/02/2018   Procedure: VIDEO BRONCHOSCOPY WITH ENDOBRONCHIAL NAVIGATION;  Surgeon: [name], MD;  Location: [facility] OR;  Service: Thoracic;  Laterality: N/A;     OB History   No obstetric history on file.     Family History  Problem Relation Age of Onset  . Heart disease Mother        MI @ 74, died at 24  . Cancer Father   . Lung cancer Father   . Heart disease Maternal Grandmother   . Lung cancer Sister   . Breast cancer Paternal Aunt     Social History   Tobacco Use  . Smoking status: Current Every Day Smoker    Packs/day: 0.50    Years: 40.00    Pack years: 20.00    Types: Cigarettes  . Smokeless tobacco: Never Used  . Tobacco comment: as of 09/14/2018, less than 1 pack/day  Vaping Use  . Vaping Use: Never used  Substance Use Topics  . Alcohol use: Yes    Comment: occasionally  . Drug use: No    Home Medications Prior to Admission medications   Medication Sig Start Date End Date Taking? Authorizing Provider  albuterol (VENTOLIN HFA) 108 (90 Base) MCG/ACT inhaler Inhale 2 puffs into the lungs every 6 (six) hours as needed for wheezing or shortness of breath. 02/02/19   [name], MD  aspirin 81 MG tablet Take 81 mg by mouth daily.    [provider]  Calcium-Magnesium-Vitamin D (CALCIUM 1200+D3 PO) Take 1 tablet  by mouth daily.    [provider]  cephALEXin (KEFLEX) 500 MG capsule Take 1 capsule (500 mg total) by mouth 4 (four) times daily. 03/07/20   Molpus, John, MD  doxazosin (CARDURA) 8 MG tablet Take 8 mg by mouth daily.     [provider]  furosemide (LASIX) 20 MG tablet Take 1 tablet (20 mg total) by mouth daily. 07/13/18 03/03/20  Kayleen Memos, DO  ibandronate (BONIVA) 150 MG tablet Take 150 mg by mouth every 30 (thirty) days. 01/19/20   [provider]  levothyroxine (SYNTHROID) 50 MCG tablet Take 50 mcg by mouth daily. 12/08/19   [provider]  metoprolol tartrate (LOPRESSOR) 25 MG tablet Take 25 mg by mouth daily. 02/19/20   [provider]  moxifloxacin (  VIGAMOX) 0.5 % ophthalmic solution Place 1 drop into the left eye 4 (four) times daily. 01/26/20   [provider]  Multiple Vitamins-Minerals (MULTIVITAMIN WITH MINERALS) tablet Take 1 tablet by mouth daily.     [provider]  Naphazoline HCl (CLEAR EYES OP) Place 2 drops into both eyes daily.    [provider]  pantoprazole (PROTONIX) 40 MG tablet Take 1 tablet (40 mg total) by mouth daily. 07/14/18   Irene Pap N, DO  PROLENSA 0.07 % SOLN Place 1 drop into the left eye daily. 01/26/20   [provider]  rosuvastatin (CRESTOR) 10 MG tablet Take 10 mg by mouth daily.     [provider]  tamoxifen (NOLVADEX) 20 MG tablet Take 1 tablet (20 mg total) by mouth daily. 01/25/20   Nicholas Lose, MD  Tiotropium Bromide-Olodaterol (STIOLTO RESPIMAT) 2.5-2.5 MCG/ACT AERS Inhale 2 puffs into the lungs daily. 10/27/19   [name], MD    Allergies    Patient has no known allergies.  Review of Systems   Review of Systems  HENT: Positive for nosebleeds.   Respiratory: Positive for shortness of breath.   Cardiovascular: Positive for leg swelling. Negative for chest pain.  Gastrointestinal: Negative for vomiting.  Neurological: Positive for weakness.  All  other systems reviewed and are negative.   Physical Exam Updated Vital Signs BP 118/73 (BP Location: Right Arm)   Pulse 87   Temp 98.4 F (36.9 C) (Oral)   Resp (!) 23   Ht 1.524 m (5')   Wt 40.8 kg   SpO2 100%   BMI 17.58 kg/m   Physical Exam CONSTITUTIONAL: Elderly, no acute distress HEAD: Normocephalic/atraumatic EYES: EOMI/PERRL ENMT: Mucous membranes moist, dried blood bilateral nares NECK: supple no meningeal signs SPINE/BACK:entire spine nontender CV: S1/S2 noted, no murmurs/rubs/gallops noted LUNGS: Lungs are clear to auscultation bilaterally, no apparent distress ABDOMEN: soft, nontender NEURO: Pt is awake/alert/appropriate, moves all extremitiesx4.  No facial droop.   EXTREMITIES: pulses normal/equal, full ROM, pitting edema noted bilateral lower extremities SKIN: warm, color normal PSYCH: no abnormalities of mood noted, alert and oriented to situation  ED Results / Procedures / Treatments   Labs (all labs ordered are listed, but only abnormal results are displayed) Labs Reviewed  BASIC METABOLIC PANEL - Abnormal; Notable for the following components:      Result Value   Sodium 118 (*)    Chloride 81 (*)    Glucose, Bld 109 (*)    Calcium 8.3 (*)    All other components within normal limits  CBC WITH DIFFERENTIAL/PLATELET - Abnormal; Notable for the following components:   RBC 2.22 (*)    Hemoglobin 7.1 (*)    HCT 20.3 (*)    RDW 15.6 (*)    Abs Immature Granulocytes 0.09 (*)    All other components within normal limits  BRAIN NATRIURETIC PEPTIDE - Abnormal; Notable for the following components:   B Natriuretic Peptide 317.8 (*)    All other components within normal limits  TROPONIN I (HIGH SENSITIVITY) - Abnormal; Notable for the following components:   Troponin I (High Sensitivity) 29 (*)    All other components within normal limits  SARS CORONAVIRUS 2 BY RT PCR (HOSPITAL ORDER, [place] [facility])  URINALYSIS, ROUTINE W REFLEX  MICROSCOPIC  SODIUM, URINE, RANDOM  TYPE AND SCREEN  PREPARE RBC (CROSSMATCH)    EKG EKG Interpretation  Date/Time:  Monday March 13 2020 02:27:45 EDT Ventricular Rate:  84 PR  Interval:    QRS Duration: 74 QT Interval:  381 QTC Calculation: 451 R Axis:   55 Text Interpretation: Sinus rhythm Anteroseptal infarct, age indeterminate Interpretation limited secondary to artifact Confirmed by Ripley Fraise ([id number]) on 03/13/2020 2:36:34 AM   Radiology DG Chest Port 1 View  Result Date: 03/13/2020 CLINICAL DATA:  Shortness of breath EXAM: PORTABLE CHEST 1 VIEW COMPARISON:  None. FINDINGS: The heart size and mediastinal contours are within normal limits. Both lungs are clear. The visualized skeletal structures are unremarkable. Biapical surgical clips. IMPRESSION: No active disease. Electronically Signed   By: Ulyses Jarred M.D.   On: 03/13/2020 02:49    Procedures .Critical Care Performed by: Ripley Fraise, MD Authorized by: Ripley Fraise, MD   Critical care provider statement:    Critical care time (minutes):  45   Critical care start time:  03/13/2020 3:30 AM   Critical care end time:  03/13/2020 4:15 AM   Critical care time was exclusive of:  Separately billable procedures and treating other patients   Critical care was necessary to treat or prevent imminent or life-threatening deterioration of the following conditions:  Respiratory failure, metabolic crisis and circulatory failure   Critical care was time spent personally by me on the following activities:  Discussions with consultants, evaluation of patient's response to treatment, examination of patient, re-evaluation of patient's condition, pulse oximetry, ordering and review of radiographic studies, ordering and review of laboratory studies and ordering and performing treatments and interventions   I assumed direction of critical care for this patient from another provider in my specialty: no      Medications Ordered  in ED Medications  0.9 %  sodium chloride infusion (has no administration in time range)  sodium chloride 0.9 % bolus 500 mL (has no administration in time range)  oxymetazoline (AFRIN) 0.05 % nasal spray 1 spray (1 spray Each Nare Given 03/13/20 0402)    ED Course  I have reviewed the triage vital signs and the nursing notes.  Pertinent labs & imaging results that were available during my care of the patient were reviewed by me and considered in my medical decision making (see chart for details).    MDM Rules/Calculators/A&P                           This patient presents to the ED for concern of weakness, this involves an extensive number of treatment options, and is a complaint that carries with it a high risk of complications and morbidity.  The differential diagnosis includes dehydration, anemia, hyponatremia, acute renal failure, acute coronary syndrome   Lab Tests:   I Ordered, reviewed, and interpreted labs, which included electrolytes, complete blood count  Medicines ordered:   I ordered medication IV fluids and packed red blood cells for hyponatremia and anemia  Imaging Studies ordered:   I ordered imaging studies which included chest xray   I independently visualized and interpreted imaging which showed no acute findings  Additional history obtained:    Previous records obtained and reviewed   Consultations Obtained:   I consulted hospitalist Dr. Alcario Drought  and discussed lab and imaging findings  Reevaluation:  After the interventions stated above, I reevaluated the patient and found patient stable  Critical Interventions:  . IV fluids and blood products  4:22 AM Patient presented for generalized weakness and shortness of breath.  She has had epistaxis for the past 7 to 10 days.  Labs reveal acute anemia  as well as hyponatremia.  This is likely as a result of her intermittent epistaxis.  She just recently had bilateral nasal packing removed by ear nose  and throat on August 20. Suspect her symptoms are combination of dehydration and anemia.  Patient agrees to blood transfusion. 2 units of packed red blood cells have been ordered. After consultation with the hospitalist, plan to give a 500 cc bolus of normal saline Patient did have small amount of epistaxis, but relieved with Afrin Final Clinical Impression(s) / ED Diagnoses Final diagnoses:  Epistaxis  Weakness  Hyponatremia  Acute anemia    Rx / DC Orders ED Discharge Orders    None       Ripley Fraise, MD 03/13/20 [phone]

## 2020-03-13 NOTE — Care Plan (Signed)
Kendra Watts is a 74 y.o. female with medical history significant of BRCA s/p lumpectomy, adenocarcinoma of lung and squamous cell cancer of lung (at same time) s/p radiation therapy,  COPD with 2-4L O2 requirement at baseline presents to the ED with ongoing episodes of epistaxis.  First seen in ED on 8/13 with epistaxis. Both nares packed. Bleeding reoccured 8/17. She also reports increasing weakness and shortness of breath. She also reports increased cough but no hemoptysis. Denies active chest pain, Denies fevers or vomiting. She is a smoker.  She is admitted for epistaxis, hemoglobin found to be 6.4, sodium 118.  Patient has right nares packing,  bleeding has stopped.  Patient is getting 2 units PRBCs given hemoglobin of 6.4.  Sodium has slightly improved to 121.   Assessment / Plan:  1. Epistaxis - 1. Nose packed by EDP 2. ENT consulted, recommended to continue nasal packing until Thursday, blood pressure control. 3. Presumably brought on by pts chronic O2 use via [place] .  2. Hyponatremia - Improving 1. DDx includes dehydration due to decreased PO intake from intermittent nose bleeding vs SIADH from lung CA 2. Pt's lung CAs have been NSCLCs in past, usually SCLC is more associated with SIADH 3. Also sodium drop is rather rapid for SIADH (was essentially normal just 10 days ago), so feel SIADH is slightly less likely. 4. Unfortunately the UA, urine sodium, and urine OSM ordered a couple of hours ago, are still pending. 5. 500cc NS bolus in ED 6. Will put pt on NS at 100cc/hr (while not getting transfused) 7. BMP Q6H 8. Hopefully the urine studies come back soon allowing [place] to formally exclude SIADH.   3. Acute blood loss anemia - 1. Due to epistaxis. 2. Transfusing 2u PRBC 3. Repeat H/H at 0800   4. Chronic resp failure with hypoxia - 1. Cont O2, but switch to face mask instead of [place] 2. Cont home nebs.   5. HTN - 1. Cont metoprolol 2. Hold lasix. 3. Added hydralazine  PRN.  DVT prophylaxis: SCDs Code Status: Full Family Communication: No family in room Disposition Plan: Home after hyponatremia resolved, anemia improved, and bleeding stopped Consults called: None, call ENT in AM Admission status: Inpatient

## 2020-03-13 NOTE — ED Notes (Signed)
Patient's O2 saturation was noted to be low on monitor, Patient had removed simple mask and her O2 saturation had dropped to 68% on RA. Patient immediately placed back on simple mask at 4L and O2 saturation came up to 94%.

## 2020-03-14 DIAGNOSIS — Z681 Body mass index (BMI) 19 or less, adult: Secondary | ICD-10-CM | POA: Diagnosis not present

## 2020-03-14 DIAGNOSIS — I110 Hypertensive heart disease with heart failure: Secondary | ICD-10-CM | POA: Diagnosis present

## 2020-03-14 DIAGNOSIS — R0902 Hypoxemia: Secondary | ICD-10-CM | POA: Diagnosis not present

## 2020-03-14 DIAGNOSIS — I6529 Occlusion and stenosis of unspecified carotid artery: Secondary | ICD-10-CM | POA: Diagnosis not present

## 2020-03-14 DIAGNOSIS — M6281 Muscle weakness (generalized): Secondary | ICD-10-CM | POA: Diagnosis not present

## 2020-03-14 DIAGNOSIS — Z923 Personal history of irradiation: Secondary | ICD-10-CM | POA: Diagnosis not present

## 2020-03-14 DIAGNOSIS — Z803 Family history of malignant neoplasm of breast: Secondary | ICD-10-CM | POA: Diagnosis not present

## 2020-03-14 DIAGNOSIS — I509 Heart failure, unspecified: Secondary | ICD-10-CM | POA: Diagnosis present

## 2020-03-14 DIAGNOSIS — Z9012 Acquired absence of left breast and nipple: Secondary | ICD-10-CM | POA: Diagnosis not present

## 2020-03-14 DIAGNOSIS — C3492 Malignant neoplasm of unspecified part of left bronchus or lung: Secondary | ICD-10-CM | POA: Diagnosis present

## 2020-03-14 DIAGNOSIS — R2681 Unsteadiness on feet: Secondary | ICD-10-CM | POA: Diagnosis not present

## 2020-03-14 DIAGNOSIS — Z20822 Contact with and (suspected) exposure to covid-19: Secondary | ICD-10-CM | POA: Diagnosis present

## 2020-03-14 DIAGNOSIS — E785 Hyperlipidemia, unspecified: Secondary | ICD-10-CM | POA: Diagnosis present

## 2020-03-14 DIAGNOSIS — J9611 Chronic respiratory failure with hypoxia: Secondary | ICD-10-CM | POA: Diagnosis present

## 2020-03-14 DIAGNOSIS — Z7401 Bed confinement status: Secondary | ICD-10-CM | POA: Diagnosis not present

## 2020-03-14 DIAGNOSIS — Z853 Personal history of malignant neoplasm of breast: Secondary | ICD-10-CM | POA: Diagnosis not present

## 2020-03-14 DIAGNOSIS — M2550 Pain in unspecified joint: Secondary | ICD-10-CM | POA: Diagnosis not present

## 2020-03-14 DIAGNOSIS — S72115S Nondisplaced fracture of greater trochanter of left femur, sequela: Secondary | ICD-10-CM | POA: Diagnosis not present

## 2020-03-14 DIAGNOSIS — J449 Chronic obstructive pulmonary disease, unspecified: Secondary | ICD-10-CM | POA: Diagnosis present

## 2020-03-14 DIAGNOSIS — Z9981 Dependence on supplemental oxygen: Secondary | ICD-10-CM | POA: Diagnosis not present

## 2020-03-14 DIAGNOSIS — C3491 Malignant neoplasm of unspecified part of right bronchus or lung: Secondary | ICD-10-CM | POA: Diagnosis present

## 2020-03-14 DIAGNOSIS — R2689 Other abnormalities of gait and mobility: Secondary | ICD-10-CM | POA: Diagnosis not present

## 2020-03-14 DIAGNOSIS — D691 Qualitative platelet defects: Secondary | ICD-10-CM | POA: Diagnosis present

## 2020-03-14 DIAGNOSIS — E039 Hypothyroidism, unspecified: Secondary | ICD-10-CM | POA: Diagnosis present

## 2020-03-14 DIAGNOSIS — R636 Underweight: Secondary | ICD-10-CM | POA: Diagnosis present

## 2020-03-14 DIAGNOSIS — R040 Epistaxis: Secondary | ICD-10-CM | POA: Diagnosis not present

## 2020-03-14 DIAGNOSIS — F17210 Nicotine dependence, cigarettes, uncomplicated: Secondary | ICD-10-CM | POA: Diagnosis present

## 2020-03-14 DIAGNOSIS — D62 Acute posthemorrhagic anemia: Secondary | ICD-10-CM | POA: Diagnosis present

## 2020-03-14 DIAGNOSIS — M6259 Muscle wasting and atrophy, not elsewhere classified, multiple sites: Secondary | ICD-10-CM | POA: Diagnosis not present

## 2020-03-14 DIAGNOSIS — I10 Essential (primary) hypertension: Secondary | ICD-10-CM | POA: Diagnosis not present

## 2020-03-14 DIAGNOSIS — Z8249 Family history of ischemic heart disease and other diseases of the circulatory system: Secondary | ICD-10-CM | POA: Diagnosis not present

## 2020-03-14 DIAGNOSIS — E222 Syndrome of inappropriate secretion of antidiuretic hormone: Secondary | ICD-10-CM | POA: Diagnosis present

## 2020-03-14 DIAGNOSIS — I701 Atherosclerosis of renal artery: Secondary | ICD-10-CM | POA: Diagnosis present

## 2020-03-14 DIAGNOSIS — E871 Hypo-osmolality and hyponatremia: Secondary | ICD-10-CM | POA: Diagnosis not present

## 2020-03-14 DIAGNOSIS — E876 Hypokalemia: Secondary | ICD-10-CM | POA: Diagnosis not present

## 2020-03-14 LAB — TYPE AND SCREEN
ABO/RH(D): O NEG
Antibody Screen: NEGATIVE
Unit division: 0
Unit division: 0

## 2020-03-14 LAB — BPAM RBC
Blood Product Expiration Date: 202109122359
Blood Product Expiration Date: 202109162359
ISSUE DATE / TIME: 202108230743
ISSUE DATE / TIME: 202108231144
Unit Type and Rh: 9500
Unit Type and Rh: 9500

## 2020-03-14 LAB — BASIC METABOLIC PANEL
Anion gap: 12 (ref 5–15)
Anion gap: 13 (ref 5–15)
BUN: 8 mg/dL (ref 8–23)
BUN: 8 mg/dL (ref 8–23)
CO2: 24 mmol/L (ref 22–32)
CO2: 26 mmol/L (ref 22–32)
Calcium: 8.4 mg/dL — ABNORMAL LOW (ref 8.9–10.3)
Calcium: 8.6 mg/dL — ABNORMAL LOW (ref 8.9–10.3)
Chloride: 95 mmol/L — ABNORMAL LOW (ref 98–111)
Chloride: 95 mmol/L — ABNORMAL LOW (ref 98–111)
Creatinine, Ser: 0.63 mg/dL (ref 0.44–1.00)
Creatinine, Ser: 0.72 mg/dL (ref 0.44–1.00)
GFR calc Af Amer: >60 mL/min (ref >60)
GFR calc Af Amer: >60 mL/min (ref >60)
GFR calc non Af Amer: >60 mL/min (ref >60)
GFR calc non Af Amer: >60 mL/min (ref >60)
Glucose, Bld: 84 mg/dL (ref 70–99)
Glucose, Bld: 85 mg/dL (ref 70–99)
Potassium: 3.3 mmol/L — ABNORMAL LOW (ref 3.5–5.1)
Potassium: 4.2 mmol/L (ref 3.5–5.1)
Sodium: 132 mmol/L — ABNORMAL LOW (ref 135–145)
Sodium: 133 mmol/L — ABNORMAL LOW (ref 135–145)

## 2020-03-14 LAB — CBC
HCT: 30.4 % — ABNORMAL LOW (ref 36.0–46.0)
Hemoglobin: 10.1 g/dL — ABNORMAL LOW (ref 12.0–15.0)
MCH: 30.1 pg (ref 26.0–34.0)
MCHC: 33.2 g/dL (ref 30.0–36.0)
MCV: 90.5 fL (ref 80.0–100.0)
Platelets: 284 10*3/uL (ref 150–400)
RBC: 3.36 MIL/uL — ABNORMAL LOW (ref 3.87–5.11)
RDW: 17.2 % — ABNORMAL HIGH (ref 11.5–15.5)
WBC: 12.9 10*3/uL — ABNORMAL HIGH (ref 4.0–10.5)
nRBC: 0 % (ref 0.0–0.2)

## 2020-03-14 LAB — BLOOD GAS, ARTERIAL
Acid-Base Excess: 1.4 mmol/L (ref 0.0–2.0)
Bicarbonate: 27.3 mmol/L (ref 20.0–28.0)
FIO2: 40
O2 Saturation: 95.3 %
Patient temperature: 98.6
pCO2 arterial: 51.7 mmHg — ABNORMAL HIGH (ref 32.0–48.0)
pH, Arterial: 7.342 — ABNORMAL LOW (ref 7.350–7.450)
pO2, Arterial: 77.8 mmHg — ABNORMAL LOW (ref 83.0–108.0)

## 2020-03-14 MED ORDER — ORAL CARE MOUTH RINSE
15.0000 mL | Freq: Two times a day (BID) | OROMUCOSAL | Status: DC
  Administered 2020-03-14 – 2020-03-18 (×7): 15 mL via OROMUCOSAL

## 2020-03-14 MED ORDER — POTASSIUM CHLORIDE 10 MEQ/100ML IV SOLN
10.0000 meq | INTRAVENOUS | Status: AC
  Administered 2020-03-14 (×2): 10 meq via INTRAVENOUS
  Filled 2020-03-14 (×2): qty 100

## 2020-03-14 NOTE — Progress Notes (Signed)
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 First seen in ED on 8/13 with epistaxis. Both nares packed. Bleeding reoccured 8/17. She also reports increasing weakness and shortness of breath. She also reports increased cough but no hemoptysis. Denies active chest pain, Denies fevers or vomiting. She is a smoker.  She is admitted for profuse epistaxis, hemoglobin found to be 6.4, sodium 118.  Patient has right nares packing,  bleeding has stopped.  Patient has received 2 units PRBCs given hemoglobin of 6.4>>  Hb improved to 10.0  Sodium has improved to 133.  Patient was seen by ENT recommended to continue nasal packing until Thursday.  Given COPD she cannot use nasal cannula due to epistaxis , she is on Ventimask, O2 saturation is 94-96.  Her O2 saturation drops when she eats, sleeps because Ventimask moves.   Assessment & Plan:   Principal Problem:   Epistaxis Active Problems:   Chronic respiratory failure with hypoxia (HCC)   Hyponatremia   Squamous cell lung cancer, left (HCC)   Adenocarcinoma, lung, right (HCC)   Acute blood loss anemia   1. Epistaxis - Stopped 1. Nose packed by EDP 2. ENT consulted, recommended to continue nasal packing until Thursday, blood pressure control. 3. Presumably brought on by pts chronic O2 use via [place] .  2. Hyponatremia - Improving 1. DDx includes dehydration due to decreased PO intake from intermittent nose bleeding vs SIADH from lung CA 2. Pt's lung CAs have been NSCLCs in past, usually SCLC is more associated with SIADH 3. Also sodium drop is rather rapid for SIADH (was essentially  normal just 10 days ago), so feel SIADH is slightly less likely. 4. 500cc NS bolus in ED 5. Will put pt on NS at 100cc/hr 6. BMP Q6H   3. Acute blood loss anemia - 1. Due to epistaxis. 2. Transfusing 2u PRBC 3. Post transfusion Hb 10.0   4. Chronic resp failure with hypoxia - 1. Cont O2, but switch to venti mask instead of [place] 2. Cont home nebs.   5. HTN - 1. Cont metoprolol 2. Hold lasix. 3. Added hydralazine PRN.  DVT prophylaxis:SCDs Code Status:Full Family Communication:No family in room Disposition Plan:Home after hyponatremia resolved, anemia improved, and bleeding stopped Consults called: ENT  Consultants:   ENT  Procedures: Nasal packing.  Antimicrobials:  Anti-infectives (From admission, onward)   Start     Dose/Rate Route Frequency Ordered Stop   03/13/20 1000  cephALEXin (KEFLEX) capsule 500 mg        500 mg Oral 4 times daily 03/13/20 4235       Subjective: Patient was seen and examined at bedside.  Overnight events noted.  Her O2 saturation dropped several times throughout the night because of Ventimask moved.  Patient is at high risk for rebleeding if nasal packing removed.  Objective: Vitals:   03/14/20 1012 03/14/20 1100 03/14/20 1300 03/14/20 1506  BP: 132/77 119/81 121/67 90/62  Pulse:  (!) 109 87 71  Resp:  _0 Temp:    99.3 F (37.4 C)  TempSrc:    Oral  SpO2:  98% 100% 100%  Weight:  Height:        Intake/Output Summary (Last 24 hours) at 03/14/2020 1544 Last data filed at 03/13/2020 2227 Gross per 24 hour  Intake 158.5 ml  Output 900 ml  Net -741.5 ml   Filed Weights   03/13/20 0127  Weight: 40.8 kg    Examination:  General exam: Appears calm and comfortable, ventimask O2 sat 94% Respiratory system: Clear to auscultation. Respiratory effort normal. Cardiovascular system: S1 & S2 heard, RRR. No JVD, murmurs, rubs, gallops or clicks. No pedal edema. Gastrointestinal system: Abdomen is nondistended, soft  and nontender. No organomegaly or masses felt. Normal bowel sounds heard. Central nervous system: Alert and oriented. No focal neurological deficits. Extremities: Symmetric 5 x 5 power. Skin: No rashes, lesions or ulcers Psychiatry: Judgement and insight appear normal. Mood & affect appropriate.     Data Reviewed: I have personally reviewed following labs and imaging studies  CBC: Recent Labs  Lab 03/13/20 0222 03/13/20 0810 03/13/20 1720 03/14/20 0444  WBC 9.7  --   --  12.9*  NEUTROABS 7.6  --   --   --   HGB 7.1* 6.4* 9.7* 10.1*  HCT 20.3* 19.0* 28.2* 30.4*  MCV 91.4  --   --  90.5  PLT 312  --   --  284   Basic Metabolic Panel: Recent Labs  Lab 03/13/20 0810 03/13/20 1128 03/13/20 1700 03/13/20 2358 03/14/20 0444  NA 121* 123* 129* 132* 133*  K 3.5 3.7 3.2* 4.2 3.3*  CL 88* 90* 100 95* 95*  CO2 25 25 23 24 26  GLUCOSE 99 98 81 84 85  BUN 12 12 9 8 8  CREATININE 0.56 0.56 0.44 0.72 0.63  CALCIUM 7.4* 7.5* 6.7* 8.6* 8.4*   GFR: Estimated Creatinine Clearance: 40.3 mL/min (by C-G formula based on SCr of 0.63 mg/dL). Liver Function Tests: No results for input(s): AST, ALT, ALKPHOS, BILITOT, PROT, ALBUMIN in the last 168 hours. No results for input(s): LIPASE, AMYLASE in the last 168 hours. No results for input(s): AMMONIA in the last 168 hours. Coagulation Profile: No results for input(s): INR, PROTIME in the last 168 hours. Cardiac Enzymes: No results for input(s): CKTOTAL, CKMB, CKMBINDEX, TROPONINI in the last 168 hours. BNP (last 3 results) No results for input(s): PROBNP in the last 8760 hours. HbA1C: No results for input(s): HGBA1C in the last 72 hours. CBG: No results for input(s): GLUCAP in the last 168 hours. Lipid Profile: No results for input(s): CHOL, HDL, LDLCALC, TRIG, CHOLHDL, LDLDIRECT in the last 72 hours. Thyroid Function Tests: No results for input(s): TSH, T4TOTAL, FREET4, T3FREE, THYROIDAB in the last 72 hours. Anemia Panel: No  results for input(s): VITAMINB12, FOLATE, FERRITIN, TIBC, IRON, RETICCTPCT in the last 72 hours. Sepsis Labs: No results for input(s): PROCALCITON, LATICACIDVEN in the last 168 hours.  Recent Results (from the past 240 hour(s))  SARS Coronavirus 2 by RT PCR (hospital order, performed in [place] hospital lab) Nasopharyngeal Nasopharyngeal Swab     Status: None   Collection Time: 03/13/20  2:22 AM   Specimen: Nasopharyngeal Swab  Result Value Ref Range Status   SARS Coronavirus 2 NEGATIVE NEGATIVE Final    Comment: (NOTE) SARS-CoV-2 target nucleic acids are NOT DETECTED.  The SARS-CoV-2 RNA is generally detectable in upper and lower respiratory specimens during the acute phase of infection. The lowest concentration of SARS-CoV-2 viral copies this assay can detect is 250 copies / mL. A negative result does not preclude SARS-CoV-2 infection and should not   be used as the sole basis for treatment or other patient management decisions.  A negative result may occur with improper specimen collection / handling, submission of specimen other than nasopharyngeal swab, presence of viral mutation(s) within the areas targeted by this assay, and inadequate number of viral copies (<250 copies / mL). A negative result must be combined with clinical observations, patient history, and epidemiological information.  Fact Sheet for Patients:   https://www.fda.gov/media/136312/download  Fact Sheet for Healthcare Providers: https://www.fda.gov/media/136313/download  This test is not yet approved or  cleared by the [place] FDA and has been authorized for detection and/or diagnosis of SARS-CoV-2 by FDA under an Emergency Use Authorization (EUA).  This EUA will remain in effect (meaning this test can be used) for the duration of the COVID-19 declaration under Section 564(b)(1) of the Act, 21 U.S.C. section 360bbb-3(b)(1), unless the authorization is terminated or revoked sooner.  Performed at  [facility], [address] [place], [place] [address]      Radiology Studies: DG Chest Port 1 View  Result Date: 03/13/2020 CLINICAL DATA:  Shortness of breath EXAM: PORTABLE CHEST 1 VIEW COMPARISON:  None. FINDINGS: The heart size and mediastinal contours are within normal limits. Both lungs are clear. The visualized skeletal structures are unremarkable. Biapical surgical clips. IMPRESSION: No active disease. Electronically Signed   By: Kevin  Herman M.D.   On: 03/13/2020 02:49    Scheduled Meds: . arformoterol  15 mcg Nebulization BID  . cephALEXin  500 mg Oral QID  . doxazosin  8 mg Oral Daily  . levothyroxine  50 mcg Oral Q0600  . metoprolol tartrate  25 mg Oral Daily  . multivitamin with minerals  1 tablet Oral Daily  . pantoprazole  40 mg Oral Daily  . rosuvastatin  10 mg Oral Daily  . tamoxifen  20 mg Oral Daily  . umeclidinium bromide  1 puff Inhalation Daily   Continuous Infusions: . sodium chloride 100 mL/hr at 03/13/20 0705     LOS: 0 days    Time spent: 25 mins     , MD [facility]   If 7PM-7AM, please contact night-coverage 

## 2020-03-14 NOTE — Plan of Care (Signed)
  Problem: Activity: Goal: Risk for activity intolerance will decrease Outcome: Progressing   Problem: Nutrition: Goal: Adequate nutrition will be maintained Outcome: Progressing   Problem: Coping: Goal: Level of anxiety will decrease Outcome: Progressing   

## 2020-03-14 NOTE — ED Notes (Signed)
Pt continues to rest comfortably in bed. Pt appears to be sleeping. Equal rise and fall of chest noted.

## 2020-03-14 NOTE — ED Notes (Signed)
Pts nephew updated on plan of care with verbal consent.

## 2020-03-14 NOTE — ED Notes (Signed)
Pt had pulled off her Venturi Mask to eat. Pts O2 sat dropped to 70%. Pt in NAD.Pt placed back on mask to bring O2 sat back up. Pt needs to take frequent breaks while eating due to low O2.  Pt placed on 6L O2 via [place] while eating. Pt tolerating well.

## 2020-03-14 NOTE — Plan of Care (Signed)
Plan of care for today discussed with patient.   She has no local family.   Explained/reinforced visitor policy.   Will continue to monitor patient.     [place], Therapist, sports

## 2020-03-14 NOTE — ED Notes (Signed)
Patients sheets/gown changed, patient given partial bed bath, and new warm blankets applied.

## 2020-03-15 LAB — BASIC METABOLIC PANEL
Anion gap: 9 (ref 5–15)
BUN: 9 mg/dL (ref 8–23)
CO2: 24 mmol/L (ref 22–32)
Calcium: 8.4 mg/dL — ABNORMAL LOW (ref 8.9–10.3)
Chloride: 97 mmol/L — ABNORMAL LOW (ref 98–111)
Creatinine, Ser: 0.63 mg/dL (ref 0.44–1.00)
GFR calc Af Amer: >60 mL/min (ref >60)
GFR calc non Af Amer: >60 mL/min (ref >60)
Glucose, Bld: 100 mg/dL — ABNORMAL HIGH (ref 70–99)
Potassium: 3.8 mmol/L (ref 3.5–5.1)
Sodium: 130 mmol/L — ABNORMAL LOW (ref 135–145)

## 2020-03-15 LAB — CBC
HCT: 31.3 % — ABNORMAL LOW (ref 36.0–46.0)
Hemoglobin: 9.9 g/dL — ABNORMAL LOW (ref 12.0–15.0)
MCH: 29.8 pg (ref 26.0–34.0)
MCHC: 31.6 g/dL (ref 30.0–36.0)
MCV: 94.3 fL (ref 80.0–100.0)
Platelets: 279 10*3/uL (ref 150–400)
RBC: 3.32 MIL/uL — ABNORMAL LOW (ref 3.87–5.11)
RDW: 17.4 % — ABNORMAL HIGH (ref 11.5–15.5)
WBC: 11.7 10*3/uL — ABNORMAL HIGH (ref 4.0–10.5)
nRBC: 0 % (ref 0.0–0.2)

## 2020-03-15 NOTE — Progress Notes (Signed)
CCMD notified RN about pt O2 in the 49s. RN came to room to find that pt had removed O2 mask. RN placed mask back on pt with a return of 91% O2. Pt educated on importance of wearing the mask. Pt stated she understood. Will continue to monitor.

## 2020-03-15 NOTE — Progress Notes (Signed)
PROGRESS NOTE   Kendra Watts  NID:[id number]    DOB: Jun 16, 1946    DOA: 03/13/2020  PCP: Merrilee Seashore, MD   I have briefly reviewed patients previous medical records in [facility]  Chief Complaint  Patient presents with  . Weakness  . 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  She was admitted for profuse epistaxis, acute blood loss anemia (hemoglobin 6.4) and severe hyponatremia (sodium 118).  Right anterior nare was packed, epistaxis resolved.  Transfused PRBC, hemoglobin stable.  Hyponatremia is improved.  ENT recommended continue nasal packing until 8/26.   Assessment & Plan:  Principal Problem:   Epistaxis Active Problems:   Chronic respiratory failure with hypoxia (HCC)   Hyponatremia   Squamous cell lung cancer, left (HCC)   Adenocarcinoma, lung, right (HCC)   Acute blood loss anemia   Epistaxis, recurrent: ?  Only from right nostril.  Right anterior nare packed by EDP.  ENT consulted 8/23 and advised that it would be difficult to control patient's recurrent epistaxis without controlling her hypertension, removing nasal cannula oxygen (may not be a long-term option) and patient to quit smoking.  Advised holding aspirin for a week.  Will discuss with TOC regarding home on nonrebreather mask with humidified oxygen or humidified oxygen via face tent.  Continue oral Keflex.  Outpatient follow-up with ENT.  Hyponatremia: DD: Dehydration from reduced p.o. intake from intermittent nosebleeding versus SIADH from lung CA (less likely).  S/p IV saline bolus and hydration.  Serum sodium has stabilized in the low 130s.   Follow BMP in a.m. and as outpatient.  DC IV fluids and monitor.  Acute blood loss anemia due to epistaxis: Presented with hemoglobin of 6.4.  S/p 2 units PRBC.  Hemoglobin stable in the 9/10 g range.  Chronic respiratory failure with hypoxia: Stable.  Please see above for home oxygen recommendations if possible.  Essential hypertension: Controlled on doxazosin and metoprolol tartrate.  Hypothyroidism:  Continue Synthroid.  Dyslipidemia: Continue statins.  Body mass index is 17.58 kg/m.   Hypokalemia: Replaced.   DVT prophylaxis: SCDs Code Status: Full Family Communication: None at bedside Disposition:  Status is: Inpatient  Remains inpatient appropriate because:Inpatient level of care appropriate due to severity of illness   Dispo: The patient is from: Home              Anticipated d/c is to: Home              Anticipated d/c date is: 1 day              Patient currently is not medically stable to d/c.        Consultants:   ENT  Procedures:   Right anterior nasal pack  Antimicrobials:   Keflex   Subjective:  Denies any further nose bleeding.  No dyspnea or pain reported.  States that her breathing is at baseline.  Objective:   Vitals:   03/15/20 1118 03/15/20 1205 03/15/20 1243 03/15/20 1456  BP:  (!) 91/56    Pulse:  73    Resp:  18    Temp:  97.7 F (36.5 C)    TempSrc:  Oral    SpO2: 95% 90% 96% 93%  Weight:      Height:        General exam: Elderly female, small built and frail sitting up comfortably in bed without distress. ENT: Right anterior nostril is packed and no evidence of epistaxis Respiratory system: Clear to auscultation. Respiratory effort normal. Cardiovascular system: S1 & S2 heard, RRR. No JVD, murmurs, rubs, gallops or clicks. No pedal edema.  Telemetry personally reviewed: Sinus rhythm. Gastrointestinal system: Abdomen is nondistended, soft and nontender. No organomegaly or masses felt. Normal bowel sounds heard. Central  nervous system: Alert and oriented. No focal neurological deficits. Extremities: Symmetric 5 x 5 power. Skin: No rashes, lesions or ulcers Psychiatry: Judgement and insight appear normal. Mood & affect appropriate.     Data Reviewed:   I have personally reviewed following labs and imaging studies   CBC: Recent Labs  Lab 03/13/20 0222 03/13/20 0810 03/13/20 1720 03/14/20 0444 03/15/20 0516  WBC 9.7  --   --  12.9* 11.7*  NEUTROABS 7.6  --   --   --   --   HGB 7.1*   < > 9.7* 10.1* 9.9*  HCT 20.3*   < > 28.2* 30.4* 31.3*  MCV 91.4  --   --  90.5 94.3  PLT 312  --   --  284 279   < > = values in this interval not displayed.    Basic Metabolic Panel: Recent Labs  Lab 03/13/20 2358 03/14/20 0444 03/15/20 0516  NA 132* 133* 130*  K 4.2 3.3* 3.8  CL 95* 95* 97*  CO2 $Re'24 26 24  'xSN$ GLUCOSE 84 85 100*  BUN $Re'8 8 9  'SIT$ CREATININE 0.72 0.63 0.63  CALCIUM 8.6* 8.4* 8.4*    Liver Function Tests: No results for input(s): AST, ALT, ALKPHOS, BILITOT, PROT, ALBUMIN in the last 168 hours.  CBG: No results for input(s): GLUCAP in the last 168 hours.  Microbiology Studies:   Recent Results (from the past 240 hour(s))  SARS Coronavirus 2 by RT PCR (hospital order, performed in [facility] hospital lab) Nasopharyngeal Nasopharyngeal Swab     Status: None   Collection Time: 03/13/20  2:22 AM   Specimen: Nasopharyngeal Swab  Result Value Ref Range Status   SARS Coronavirus 2 NEGATIVE NEGATIVE Final    Comment: (NOTE) SARS-CoV-2 target nucleic acids are NOT DETECTED.  The SARS-CoV-2 RNA is generally detectable in upper and lower respiratory specimens during the acute phase of infection. The lowest concentration of SARS-CoV-2 viral copies this assay can detect is 250 copies / mL. A negative result does not preclude SARS-CoV-2 infection and should not be used as the sole basis for treatment or other patient management decisions.  A negative result may occur with improper specimen  collection / handling, submission of specimen other than nasopharyngeal swab, presence of viral mutation(s) within the areas targeted by this assay, and inadequate number of viral copies (<250 copies / mL). A negative result must be combined with clinical observations, patient history, and epidemiological information.  Fact Sheet for Patients:   StrictlyIdeas.no  Fact Sheet for Healthcare Providers: BankingDealers.co.za  This test is not yet approved or  cleared by the [place] FDA and has been authorized for detection and/or diagnosis of SARS-CoV-2 by FDA under an Emergency Use Authorization (EUA).  This EUA will remain in effect (meaning this test can be used) for the duration of the COVID-19 declaration under Section 564(b)(1) of the Act, 21 U.S.C. section 360bbb-3(b)(1),  unless the authorization is terminated or revoked sooner.  Performed at [facility], [place] [address] [place], [place] [address]      Radiology Studies:  No results found.   Scheduled Meds:   . arformoterol  15 mcg Nebulization BID  . cephALEXin  500 mg Oral QID  . doxazosin  8 mg Oral Daily  . levothyroxine  50 mcg Oral Q0600  . mouth rinse  15 mL Mouth Rinse BID  . metoprolol tartrate  25 mg Oral Daily  . multivitamin with minerals  1 tablet Oral Daily  . pantoprazole  40 mg Oral Daily  . rosuvastatin  10 mg Oral Daily  . tamoxifen  20 mg Oral Daily  . umeclidinium bromide  1 puff Inhalation Daily    Continuous Infusions:   . sodium chloride 100 mL/hr at 03/15/20 1247     LOS: 1 day     Vernell Leep, MD, [place], [facility]. [facility]    To contact the attending provider between 7A-7P or the covering provider during after hours 7P-7A, please log into the web site [email or web address] and access using universal [place] password for that web site. If you do not have the password, please call the hospital  operator.  03/15/2020, 3:51 PM

## 2020-03-16 LAB — CBC
HCT: 29.9 % — ABNORMAL LOW (ref 36.0–46.0)
Hemoglobin: 9.2 g/dL — ABNORMAL LOW (ref 12.0–15.0)
MCH: 29.4 pg (ref 26.0–34.0)
MCHC: 30.8 g/dL (ref 30.0–36.0)
MCV: 95.5 fL (ref 80.0–100.0)
Platelets: 259 10*3/uL (ref 150–400)
RBC: 3.13 MIL/uL — ABNORMAL LOW (ref 3.87–5.11)
RDW: 17 % — ABNORMAL HIGH (ref 11.5–15.5)
WBC: 9.1 10*3/uL (ref 4.0–10.5)
nRBC: 0 % (ref 0.0–0.2)

## 2020-03-16 LAB — BASIC METABOLIC PANEL
Anion gap: 8 (ref 5–15)
BUN: 8 mg/dL (ref 8–23)
CO2: 24 mmol/L (ref 22–32)
Calcium: 8.2 mg/dL — ABNORMAL LOW (ref 8.9–10.3)
Chloride: 97 mmol/L — ABNORMAL LOW (ref 98–111)
Creatinine, Ser: 0.61 mg/dL (ref 0.44–1.00)
GFR calc Af Amer: >60 mL/min (ref >60)
GFR calc non Af Amer: >60 mL/min (ref >60)
Glucose, Bld: 100 mg/dL — ABNORMAL HIGH (ref 70–99)
Potassium: 3.9 mmol/L (ref 3.5–5.1)
Sodium: 129 mmol/L — ABNORMAL LOW (ref 135–145)

## 2020-03-16 MED ORDER — NICOTINE 14 MG/24HR TD PT24
14.0000 mg | MEDICATED_PATCH | Freq: Every day | TRANSDERMAL | Status: DC
  Administered 2020-03-16 – 2020-03-18 (×3): 14 mg via TRANSDERMAL
  Filled 2020-03-16 (×3): qty 1

## 2020-03-16 MED ORDER — BACITRACIN ZINC 500 UNIT/GM EX OINT
TOPICAL_OINTMENT | Freq: Two times a day (BID) | CUTANEOUS | Status: DC
  Filled 2020-03-16 (×2): qty 28.35

## 2020-03-16 MED ORDER — MUPIROCIN 2 % EX OINT
TOPICAL_OINTMENT | CUTANEOUS | Status: AC
  Filled 2020-03-16: qty 22

## 2020-03-16 NOTE — Progress Notes (Signed)
Subjective: Patient has had no further bleeding since her packing and admission 3 days ago.  She has not been using the humidified air via face tent but rather has been given the option of a facemask or nasal cannula.  She is certainly willing to use the face tent.  Objective: Vital signs in last 24 hours: Temp:  [97.7 F (36.5 C)-98.3 F (36.8 C)] 98.2 F (36.8 C) (08/26 0431) Pulse Rate:  [73-93] 86 (08/26 0934) Resp:  [18-20] 20 (08/26 0431) BP: (91-123)/(56-75) 123/75 (08/26 0934) SpO2:  [90 %-99 %] 91 % (08/26 0821) Wt Readings from Last 1 Encounters:  03/13/20 40.8 kg    Intake/Output from previous day: 08/25 0701 - 08/26 0700 In: 2197.1 [P.O.:1080; I.V.:1117.1] Out: 800 [Urine:800] Intake/Output this shift: No intake/output data recorded.  Pack in place on right side of nose.  No evidence of bleeding.  Recent Labs    03/15/20 0516 03/16/20 0529  WBC 11.7* 9.1  HGB 9.9* 9.2*  HCT 31.3* 29.9*  PLT 279 259    Recent Labs    03/15/20 0516 03/16/20 0529  NA 130* 129*  K 3.8 3.9  CL 97* 97*  CO2 24 24  GLUCOSE 100* 100*  BUN 9 8  CREATININE 0.63 0.61  CALCIUM 8.4* 8.2*    Medications: I have reviewed the patient's current medications.  Assessment/Plan: Epistaxis  I remove the nasal catheter from the right side of the nose without difficulty.  I recommended that we feel both sides of her nose twice daily with bacitracin ointment.  Second, the patient should use a face tent only for humidified air or oxygen therapy.  If this does not happen, it is very likely she will rebleed.   LOS: 2 days   Marcina Millard 03/16/2020, 10:54 AM

## 2020-03-16 NOTE — NC FL2 (Signed)
[place] LEVEL OF CARE SCREENING TOOL     IDENTIFICATION  Patient Name: Kendra Watts Birthdate: April 12, 1946 Sex: female Admission Date (Current Location): 03/13/2020  [facility] and [place] Number:  Herbalist and Address:  [facility],  [place], [place]      Provider Number: [id number]  Attending Physician Name and Address:  Etta Quill, DO;Hon*  Relative Name and Phone Number:       Current Level of Care: Hospital Recommended Level of Care:   Prior Approval Number:    Date Approved/Denied:   PASRR Number: [id number]  Discharge Plan: SNF    Current Diagnoses: Patient Active Problem List   Diagnosis Date Noted  . Epistaxis 03/13/2020  . Acute blood loss anemia 03/13/2020  . Current smoker   . Hypertension, uncontrolled   . 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 Hemoptysis 09/14/2018  . Squamous cell lung cancer, left ([place]) 09/10/2018  . Adenocarcinoma, lung, right ([place]) 09/10/2018  . Multiple lung nodules 07/20/2018  . Chronic obstructive pulmonary disease ([place]) 07/20/2018  . Acute CHF (congestive heart failure) ([place]) 07/06/2018  . Chronic respiratory failure with hypoxia ([place]) 07/06/2018  . Hyponatremia 07/06/2018  . Anemia 07/06/2018  . Breast cancer of upper-outer quadrant of left female breast ([place]) 09/08/2015  . Renal artery stenosis ([place]) 08/23/2014  . Right upper extremity numbness 07/01/2013  . Atherosclerotic renal artery stenosis, bilateral ([place]) 05/26/2013  . Peripheral arterial disease ([place]) 05/26/2013  . Essential hypertension 05/26/2013  . Hyperlipidemia 05/26/2013  . Tobacco abuse 05/26/2013  . Carotid artery stenosis 03/09/2012    Orientation RESPIRATION BLADDER Height & Weight     Self, Time, Situation, Place  O2 (face mask humidification 02 4liters) Continent Weight: 40.8 kg Height:  5' (152.4 cm)  BEHAVIORAL SYMPTOMS/MOOD NEUROLOGICAL BOWEL NUTRITION STATUS       Continent Diet (Regular)  AMBULATORY STATUS COMMUNICATION OF NEEDS Skin     Verbally Normal                       Personal Care Assistance Level of Assistance  Bathing, Feeding, Dressing Bathing Assistance: Limited assistance Feeding assistance: Limited assistance       Functional Limitations Info  Sight, Speech, Hearing Sight Info: Adequate Hearing Info: Adequate Speech Info: Adequate    SPECIAL CARE FACTORS FREQUENCY  PT (By licensed PT), OT (By licensed OT)     PT Frequency: 5x week OT Frequency: 5x week            Contractures Contractures Info: Not present    Additional Factors Info  Code Status, Allergies Code Status Info: Full code Allergies Info: NKA           Current Medications (03/16/2020):  This is the current hospital active medication list Current Facility-Administered Medications  Medication Dose Route Frequency Provider Last Rate Last Admin  . acetaminophen (TYLENOL) tablet 650 mg  650 mg Oral Q6H PRN Etta Quill, DO       Or  . acetaminophen (TYLENOL) suppository 650 mg  650 mg Rectal Q6H PRN Etta Quill, DO      . albuterol (PROVENTIL) (2.5 MG/3ML) 0.083% nebulizer solution 2.5 mg  2.5 mg Nebulization Q6H PRN Ripley Fraise, MD      . arformoterol [facility]) nebulizer solution 15 mcg  15 mcg Nebulization BID Etta Quill, DO   15 mcg at 03/16/20 0818  . bacitracin ointment   Topical BID  Marcina Millard, MD      . cephALEXin [facility]) capsule 500 mg  500 mg Oral QID Jennette Kettle M, DO   500 mg at 03/16/20 2111  . doxazosin (CARDURA) tablet 8 mg  8 mg Oral Daily Jennette Kettle M, DO   8 mg at 03/16/20 5520  . hydrALAZINE (APRESOLINE) injection 10 mg  10 mg Intravenous Q6H PRN Shawna Clamp, MD      . levothyroxine (SYNTHROID) tablet 50 mcg  50 mcg Oral Q0600 Etta Quill, DO   50 mcg at 03/16/20 0504  . MEDLINE mouth rinse  15 mL Mouth Rinse BID Shawna Clamp, MD   15 mL at 03/16/20 0940  . metoprolol tartrate  (LOPRESSOR) tablet 25 mg  25 mg Oral Daily Jennette Kettle M, DO   25 mg at 03/16/20 0934  . multivitamin with minerals tablet 1 tablet  1 tablet Oral Daily Etta Quill, DO   1 tablet at 03/16/20 0934  . nicotine (NICODERM CQ - dosed in mg/24 hours) patch 14 mg  14 mg Transdermal Daily Modena Jansky, MD   14 mg at 03/16/20 0945  . ondansetron (ZOFRAN) tablet 4 mg  4 mg Oral Q6H PRN Etta Quill, DO       Or  . ondansetron [facility]) injection 4 mg  4 mg Intravenous Q6H PRN Etta Quill, DO      . pantoprazole (PROTONIX) EC tablet 40 mg  40 mg Oral Daily Jennette Kettle M, DO   40 mg at 03/16/20 0934  . rosuvastatin (CRESTOR) tablet 10 mg  10 mg Oral Daily Jennette Kettle M, DO   10 mg at 03/16/20 0934  . tamoxifen (NOLVADEX) tablet 20 mg  20 mg Oral Daily Jennette Kettle M, DO   20 mg at 03/16/20 0933  . umeclidinium bromide (INCRUSE ELLIPTA) 62.5 MCG/INH 1 puff  1 puff Inhalation Daily Etta Quill, DO   1 puff at 03/16/20 8022     Discharge Medications: Please see discharge summary for a list of discharge medications.  Relevant Imaging Results:  Relevant Lab Results:   Additional Information ss#[id number]  Arabel Barcenas, Juliann Pulse, RN

## 2020-03-16 NOTE — Progress Notes (Signed)
PROGRESS NOTE   Kendra Watts  CBU:[id number]    DOB: 22-Aug-1945    DOA: 03/13/2020  PCP: Merrilee Seashore, MD   I have briefly reviewed patients previous medical records in [facility]  Chief Complaint  Patient presents with  . Weakness  . 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  She was admitted for profuse epistaxis, acute blood loss anemia (hemoglobin 6.4) and severe hyponatremia (sodium 118).  Right anterior nare was packed, epistaxis resolved.  Transfused PRBC, hemoglobin stable.  Hyponatremia is improved.  ENT discontinued nasal packing on 8/26.  Patient has poor social situation, unsteady of gait and PT recommends SNF, TOC consulted.   Assessment & Plan:  Principal Problem:   Epistaxis Active Problems:   Chronic respiratory failure with hypoxia (HCC)   Hyponatremia   Squamous cell lung cancer, left (HCC)   Adenocarcinoma, lung, right (HCC)   Acute blood loss anemia   Epistaxis, recurrent: ?  Only from right nostril.  Right anterior nare packed by EDP.  ENT consulted 8/23 and advised that it would be difficult to control patient's recurrent epistaxis without controlling her hypertension, removing nasal cannula oxygen (may not be a long-term option) and patient to quit smoking.  Advised holding aspirin for a week.  Dr. Marcelline Deist, ENT removed nasal packing on 8/26, recommends bilateral nostril bacitracin ointment twice daily, should use only a face tent for humidified oxygen or air and not facemask or nasal cannula oxygen with which she is likely to rebleed.  Outpatient follow-up with patient's primary  ENT.  Hyponatremia: DD: Dehydration from reduced p.o. intake from intermittent nosebleeding versus SIADH from lung CA (less likely).  S/p IV saline bolus and hydration.  Serum sodium has stabilized in the low 130s.  IV fluids discontinued.  Serum sodium at 129.  Follow BMP in a.m. and periodically.  Acute blood loss anemia due to epistaxis: Presented with hemoglobin of 6.4.  S/p 2 units PRBC.  Hemoglobin with a slow drift down to 9.2 today.  Follow CBC in a.m.  Transfusion threshold is 7 g or less.  Chronic respiratory failure with hypoxia: Stable.  Please see above for home oxygen recommendations if possible.  Essential hypertension: Controlled on doxazosin and metoprolol tartrate.  Hypothyroidism:  Continue Synthroid.  Dyslipidemia:  Continue statins.  Body mass index is 17.58 kg/m.   Hypokalemia: Replaced.  Tobacco abuse: Smokes half a pack per day.  Agreeable to nicotine patch, ordered.   DVT prophylaxis: SCDs Code Status: Full Family Communication: None at bedside Disposition:  Status is: Inpatient  Remains inpatient appropriate because:Inpatient level of care appropriate due to severity of illness   Dispo: The patient is from: Home              Anticipated d/c is to: SNF.  As per PT evaluation, patient is unsteady, desaturates easily despite oxygen, lives alone with little social support.  She is unsafe to DC home especially with new oxygen option, recurrent epistaxis and high risk for rehospitalization.  Thereby SNF.              Anticipated d/c date is: 1 day  Patient currently is not medically stable to d/c.        Consultants:   ENT  Procedures:   Right anterior nasal pack  Antimicrobials:   Keflex   Subjective:  Patient seen this morning prior to pack removal.  No further epistaxis.  Denied dyspnea.  Agreeable to nicotine patch.  Objective:   Vitals:   03/16/20 0934 03/16/20 1107 03/16/20 1337 03/16/20 1339  BP: 123/75   90/60   Pulse: 86   71  Resp:    20  Temp:    97.8 F (36.6 C)  TempSrc:    Oral  SpO2:  95% 98% 97%  Weight:      Height:        General exam: Elderly female, small built and frail sitting up comfortably in bed without distress. ENT: Right anterior nostril is packed and no evidence of epistaxis Respiratory system: Clear to auscultation.  No increased work of breathing. Cardiovascular system: S1 & S2 heard, RRR. No JVD, murmurs, rubs, gallops or clicks. No pedal edema.  Telemetry personally reviewed: Sinus rhythm. Gastrointestinal system: Abdomen is nondistended, soft and nontender. No organomegaly or masses felt. Normal bowel sounds heard. Central nervous system: Alert and oriented. No focal neurological deficits. Extremities: Symmetric 5 x 5 power. Skin: No rashes, lesions or ulcers Psychiatry: Judgement and insight appear normal. Mood & affect appropriate.     Data Reviewed:   I have personally reviewed following labs and imaging studies   CBC: Recent Labs  Lab 03/13/20 0222 03/13/20 0810 03/14/20 0444 03/15/20 0516 03/16/20 0529  WBC 9.7  --  12.9* 11.7* 9.1  NEUTROABS 7.6  --   --   --   --   HGB 7.1*   < > 10.1* 9.9* 9.2*  HCT 20.3*   < > 30.4* 31.3* 29.9*  MCV 91.4  --  90.5 94.3 95.5  PLT 312  --  284 279 259   < > = values in this interval not displayed.    Basic Metabolic Panel: Recent Labs  Lab 03/14/20 0444 03/15/20 0516 03/16/20 0529  NA 133* 130* 129*  K 3.3* 3.8 3.9  CL 95* 97* 97*  CO2 $Re'26 24 24  'xVD$ GLUCOSE 85 100* 100*  BUN $Re'8 9 8  'UXM$ CREATININE 0.63 0.63 0.61  CALCIUM 8.4* 8.4* 8.2*    Liver Function Tests: No results for input(s): AST, ALT, ALKPHOS, BILITOT, PROT, ALBUMIN in the last 168 hours.  CBG: No results for input(s): GLUCAP in the last 168 hours.  Microbiology Studies:   Recent Results (from the past 240 hour(s))  SARS Coronavirus 2 by RT PCR (hospital order, performed in [facility] hospital lab) Nasopharyngeal Nasopharyngeal Swab      Status: None   Collection Time: 03/13/20  2:22 AM   Specimen: Nasopharyngeal Swab  Result Value Ref Range Status   SARS Coronavirus 2 NEGATIVE NEGATIVE Final    Comment: (NOTE) SARS-CoV-2 target nucleic acids are NOT DETECTED.  The SARS-CoV-2 RNA is generally detectable in upper and lower respiratory specimens during the acute phase of infection. The lowest concentration of SARS-CoV-2 viral copies this assay can detect is 250 copies / mL. A negative result does not preclude SARS-CoV-2 infection and should not be used as the sole basis for treatment or other patient management decisions.  A negative result may occur with improper specimen collection / handling, submission of specimen other than nasopharyngeal swab, presence of viral mutation(s) within the areas targeted by this assay, and inadequate number of  viral copies (<250 copies / mL). A negative result must be combined with clinical observations, patient history, and epidemiological information.  Fact Sheet for Patients:   StrictlyIdeas.no  Fact Sheet for Healthcare Providers: BankingDealers.co.za  This test is not yet approved or  cleared by the [place] FDA and has been authorized for detection and/or diagnosis of SARS-CoV-2 by FDA under an Emergency Use Authorization (EUA).  This EUA will remain in effect (meaning this test can be used) for the duration of the COVID-19 declaration under Section 564(b)(1) of the Act, 21 U.S.C. section 360bbb-3(b)(1), unless the authorization is terminated or revoked sooner.  Performed at [facility], [place] [address], [place], [place] [address]      Radiology Studies:  No results found.   Scheduled Meds:   . arformoterol  15 mcg Nebulization BID  . bacitracin   Topical BID  . cephALEXin  500 mg Oral QID  . doxazosin  8 mg Oral Daily  . levothyroxine  50 mcg Oral Q0600  . mouth rinse  15 mL Mouth Rinse BID   . metoprolol tartrate  25 mg Oral Daily  . multivitamin with minerals  1 tablet Oral Daily  . nicotine  14 mg Transdermal Daily  . pantoprazole  40 mg Oral Daily  . rosuvastatin  10 mg Oral Daily  . tamoxifen  20 mg Oral Daily  . umeclidinium bromide  1 puff Inhalation Daily    Continuous Infusions:      LOS: 2 days     Vernell Leep, MD, [place], [facility]. [facility]    To contact the attending provider between 7A-7P or the covering provider during after hours 7P-7A, please log into the web site [email or web address] and access using universal  password for that web site. If you do not have the password, please call the hospital operator.  03/16/2020, 2:20 PM

## 2020-03-16 NOTE — Progress Notes (Signed)
No more bleeding since pack removed!  Wearing the humidified face tent!  Thank you to respiratory and nursing.  Continue with the liberal use of the bacitracin ointment for the next two days.  I will sign off for now. Please reconsult as needed.

## 2020-03-16 NOTE — Progress Notes (Signed)
Report called to [place], RN on [place].

## 2020-03-16 NOTE — TOC Initial Note (Signed)
Transition of Care [facility]) - Initial/Assessment Note    Patient Details  Name: Kendra Watts MRN: [id number] Date of Birth: 1946-05-07  Transition of Care [facility] CM/SW Contact:    Dessa Phi, RN Phone Number: 03/16/2020, 2:23 PM  Clinical Narrative: From home alone, patient prefers herself for all info-states no family she trusts. Patient A+Ox3. PT recc SNF-patient agree to fax out to SNF-await bed offers.                  Expected Discharge Plan: Skilled Nursing Facility Barriers to Discharge: Continued Medical Work up   Patient Goals and CMS Choice Patient states their goals for this hospitalization and ongoing recovery are:: go to rehab CMS Medicare.gov Compare Post Acute Care list provided to:: Patient Choice offered to / list presented to : Patient  Expected Discharge Plan and Services Expected Discharge Plan: [place]   Discharge Planning Services: CM Consult   Living arrangements for the past 2 months: Apartment                                      Prior Living Arrangements/Services Living arrangements for the past 2 months: Apartment Lives with:: Self Patient language and need for interpreter reviewed:: Yes Do you feel safe going back to the place where you live?: Yes      Need for Family Participation in Patient Care: No (Comment) Care giver support system in place?: Yes (comment)   Criminal Activity/Legal Involvement Pertinent to Current Situation/Hospitalization: No - Comment as needed  Activities of Daily Living Home Assistive Devices/Equipment: Eyeglasses ADL Screening (condition at time of admission) Patient's cognitive ability adequate to safely complete daily activities?: Yes Is the patient deaf or have difficulty hearing?: No Does the patient have difficulty seeing, even when wearing glasses/contacts?: No Does the patient have difficulty concentrating, remembering, or making decisions?: No Patient able to express need for  assistance with ADLs?: Yes Does the patient have difficulty dressing or bathing?: No Independently performs ADLs?: Yes (appropriate for developmental age) Does the patient have difficulty walking or climbing stairs?: Yes (seecondary to weakness) Weakness of Legs: Both Weakness of Arms/Hands: None  Permission Sought/Granted Permission sought to share information with : Case Manager Permission granted to share information with : Yes, Verbal Permission Granted  Share Information with NAME: Case manager           Emotional Assessment Appearance:: Appears stated age Attitude/Demeanor/Rapport: Gracious Affect (typically observed): Accepting Orientation: : Oriented to Self, Oriented to Place, Oriented to  Time, Oriented to Situation Alcohol / Substance Use: Tobacco Use Psych Involvement: No (comment)  Admission diagnosis:  Epistaxis [R04.0] Hyponatremia [E87.1] Weakness [R53.1] Acute anemia [D64.9] Patient Active Problem List   Diagnosis Date Noted  . Epistaxis 03/13/2020  . Acute blood loss anemia 03/13/2020  . Current smoker   . Hypertension, uncontrolled   . 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 Hemoptysis 09/14/2018  . Squamous cell lung cancer, left ([place]) 09/10/2018  . Adenocarcinoma, lung, right ([place]) 09/10/2018  . Multiple lung nodules 07/20/2018  . Chronic obstructive pulmonary disease ([place]) 07/20/2018  . Acute CHF (congestive heart failure) ([place]) 07/06/2018  . Chronic respiratory failure with hypoxia ([place]) 07/06/2018  . Hyponatremia 07/06/2018  . Anemia 07/06/2018  . Breast cancer of upper-outer quadrant of left female breast ([place]) 09/08/2015  . Renal artery stenosis ([place]) 08/23/2014  . Right upper extremity numbness 07/01/2013  .  Atherosclerotic renal artery stenosis, bilateral ([place]) 05/26/2013  . Peripheral arterial disease ([place]) 05/26/2013  . Essential hypertension 05/26/2013  . Hyperlipidemia 05/26/2013  . Tobacco abuse 05/26/2013  . Carotid artery  stenosis 03/09/2012   PCP:  Merrilee Seashore, MD Pharmacy:   [id number] - [place], [place]. AT [address]. [place] [address] Phone: [phone] Fax: [phone]     Social Determinants of Health (SDOH) Interventions    Readmission Risk Interventions No flowsheet data found.

## 2020-03-16 NOTE — Progress Notes (Signed)
Pts. R Nare remains packed s/p nosebleed-ENT

## 2020-03-16 NOTE — Evaluation (Signed)
Physical Therapy Evaluation Patient Details Name: Kendra Watts MRN: [id number] DOB: 1946/06/22 Today's Date: 03/16/2020   History of Present Illness  74 year old female with PMH of BRCA s/p lumpectomy, adenocarcinoma of lung and squamous cell carcinoma of lung (at same time) s/p radiation therapy, COPD, chronic respiratory failure with hypoxia on home oxygen 2-4 L/min, seen in ED on 8/13 for epistaxis, both nares were packed, bleeding reoccurred on 8/17 and also had associated weakness and dyspnea with cough.  She was admitted 8/23 for profuse epistaxis, acute blood loss anemia (hemoglobin 6.4) and severe hyponatremia.  Clinical Impression   Pt presents with LE weakness, poor standing balance, impaired gait, O2 desaturation with mobility on 6LO2, and decreased activity tolerance. Pt to benefit from acute PT to address deficits. Pt ambulated short room distance with min +2 assist, pt limited by significant unsteadiness and O2 desaturation to 75% on 6LO2, requiring 8LO2 and breathing cuing to recover. Pt lives alone, with little social support. PT recommending ST-SNF post-acutely. PT to progress mobility as tolerated, and will continue to follow acutely.       Follow Up Recommendations SNF    Equipment Recommendations  Other (comment) (defer)    Recommendations for Other Services       Precautions / Restrictions Precautions Precautions: Fall Precaution Comments: watch sats Restrictions Weight Bearing Restrictions: No      Mobility  Bed Mobility Overal bed mobility: Needs Assistance Bed Mobility: Supine to Sit     Supine to sit: Min guard;HOB elevated     General bed mobility comments: min guard for safety, very increased time and effort.  Transfers Overall transfer level: Needs assistance Equipment used: 1 person hand held assist Transfers: Sit to/from Stand Sit to Stand: Min assist;From elevated surface         General transfer comment: min assist for power up,  steadying, correcting posterior leaning.  Ambulation/Gait Ambulation/Gait assistance: Min assist;+2 safety/equipment Gait Distance (Feet): 5 Feet Assistive device: 1 person hand held assist Gait Pattern/deviations: Step-through pattern;Decreased stride length;Trunk flexed;Narrow base of support Gait velocity: decr   General Gait Details: min assist to steady, correct posterior leaning. Verbal cuing for breathing technique, SpO2 to 75% on 6LO2 requiring 8LO2 to recover >90%.  Stairs            Wheelchair Mobility    Modified Rankin (Stroke Patients Only)       Balance Overall balance assessment: Needs assistance Sitting-balance support: No upper extremity supported;Feet unsupported Sitting balance-Leahy Scale: Good     Standing balance support: Single extremity supported;During functional activity Standing balance-Leahy Scale: Poor Standing balance comment: reliant on external support                             Pertinent Vitals/Pain Pain Assessment: No/denies pain    Home Living Family/patient expects to be discharged to:: Private residence Living Arrangements: Alone Available Help at Discharge: Neighbor;Available PRN/intermittently Type of Home: Apartment Home Access: Stairs to enter Entrance Stairs-Rails: None Entrance Stairs-Number of Steps: 4-5 Home Layout: One level Home Equipment: Cane - single point      Prior Function Level of Independence: Independent         Comments: pt has a dog     Hand Dominance   Dominant Hand: Right    Extremity/Trunk Assessment   Upper Extremity Assessment Upper Extremity Assessment: Generalized weakness    Lower Extremity Assessment Lower Extremity Assessment: Generalized weakness    Cervical / Trunk  Assessment Cervical / Trunk Assessment: Normal  Communication   Communication: HOH  Cognition Arousal/Alertness: Awake/alert Behavior During Therapy: WFL for tasks assessed/performed Overall  Cognitive Status: Within Functional Limits for tasks assessed                                        General Comments      Exercises     Assessment/Plan    PT Assessment Patient needs continued PT services  PT Problem List Decreased strength;Decreased mobility;Decreased activity tolerance;Decreased balance;Cardiopulmonary status limiting activity       PT Treatment Interventions DME instruction;Therapeutic activities;Gait training;Therapeutic exercise;Patient/family education;Balance training;Functional mobility training;Neuromuscular re-education    PT Goals (Current goals can be found in the Care Plan section)  Acute Rehab PT Goals Patient Stated Goal: get stronger PT Goal Formulation: With patient Time For Goal Achievement: 03/30/20 Potential to Achieve Goals: Good    Frequency Min 2X/week   Barriers to discharge Decreased caregiver support      Co-evaluation               AM-PAC PT "6 Clicks" Mobility  Outcome Measure Help needed turning from your back to your side while in a flat bed without using bedrails?: A Little Help needed moving from lying on your back to sitting on the side of a flat bed without using bedrails?: A Little Help needed moving to and from a bed to a chair (including a wheelchair)?: A Little Help needed standing up from a chair using your arms (e.g., wheelchair or bedside chair)?: A Little Help needed to walk in hospital room?: A Lot Help needed climbing 3-5 steps with a railing? : A Lot 6 Click Score: 16    End of Session Equipment Utilized During Treatment: Oxygen Activity Tolerance: Patient limited by fatigue;Treatment limited secondary to medical complications (Comment) (O2 desat) Patient left: in chair;with call bell/phone within reach;with nursing/sitter in room;Other (comment) (chair alarm to be activated by NT) Nurse Communication: Mobility status PT Visit Diagnosis: Muscle weakness (generalized)  (M62.81);Unsteadiness on feet (R26.81)    Time: 4496-7591 PT Time Calculation (min) (ACUTE ONLY): 19 min   Charges:   PT Evaluation $PT Eval Low Complexity: 1 Low         Boris Engelmann E, PT Acute Rehabilitation Services Pager [phone]  Office [phone]   Ashdon Gillson D Elonda Husky 03/16/2020, 10:57 AM

## 2020-03-16 NOTE — Progress Notes (Signed)
I have accepted pt in transfer from 1405 to 1603 and will take over her care. I agree with charted shift assessment. Continue to monitor. Hortencia Conradi RN

## 2020-03-17 LAB — CBC
HCT: 31.1 % — ABNORMAL LOW (ref 36.0–46.0)
Hemoglobin: 9.7 g/dL — ABNORMAL LOW (ref 12.0–15.0)
MCH: 29.6 pg (ref 26.0–34.0)
MCHC: 31.2 g/dL (ref 30.0–36.0)
MCV: 94.8 fL (ref 80.0–100.0)
Platelets: 252 10*3/uL (ref 150–400)
RBC: 3.28 MIL/uL — ABNORMAL LOW (ref 3.87–5.11)
RDW: 16.7 % — ABNORMAL HIGH (ref 11.5–15.5)
WBC: 6.5 10*3/uL (ref 4.0–10.5)
nRBC: 0 % (ref 0.0–0.2)

## 2020-03-17 LAB — BASIC METABOLIC PANEL
Anion gap: 5 (ref 5–15)
BUN: 8 mg/dL (ref 8–23)
CO2: 26 mmol/L (ref 22–32)
Calcium: 8.7 mg/dL — ABNORMAL LOW (ref 8.9–10.3)
Chloride: 101 mmol/L (ref 98–111)
Creatinine, Ser: 0.59 mg/dL (ref 0.44–1.00)
GFR calc Af Amer: >60 mL/min (ref >60)
GFR calc non Af Amer: >60 mL/min (ref >60)
Glucose, Bld: 95 mg/dL (ref 70–99)
Potassium: 4.3 mmol/L (ref 3.5–5.1)
Sodium: 132 mmol/L — ABNORMAL LOW (ref 135–145)

## 2020-03-17 NOTE — Progress Notes (Addendum)
PROGRESS NOTE   Kendra Watts  WLN:[id number]    DOB: [birth year]    DOA: 03/13/2020  PCP: Merrilee Seashore, MD   I have briefly reviewed patients previous medical records in [facility].  Chief Complaint  Patient presents with  . Weakness  . 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  She was admitted for profuse epistaxis, acute blood loss anemia (hemoglobin 6.4) and severe hyponatremia (sodium 118).  Right anterior nare was packed, epistaxis resolved.  Transfused PRBC, hemoglobin stable.  Hyponatremia is improved.  ENT discontinued nasal packing on 8/26.  Patient has poor social situation, unsteady of gait and PT recommends SNF, TOC consulted.  Patient medically stable for DC but TOC team unable to find any SNF which will take the patient with facemask or tent with humidified oxygen and they will only take her with the nasal cannula oxygen.  PT reiterates that she is unsafe to DC home.  Thereby likely DC to SNF on 8/28 with nasal cannula oxygen.   Assessment & Plan:  Principal Problem:   Epistaxis Active Problems:   Chronic respiratory failure with hypoxia (HCC)   Hyponatremia   Squamous cell lung cancer, left (HCC)   Adenocarcinoma, lung, right (HCC)   Acute blood loss anemia   Epistaxis, recurrent: ?  Only from right nostril.  Right anterior nare packed by EDP.  ENT consulted 8/23 and advised that it would be difficult to control patient's recurrent epistaxis without controlling her hypertension, removing nasal cannula oxygen (may not be a long-term option) and patient to quit smoking.  Advised holding aspirin for a week.  Dr.  Marcelline Deist, ENT removed nasal packing on 8/26, recommends bilateral nostril bacitracin ointment twice daily, should use only a face tent for humidified oxygen or air and not facemask or nasal cannula oxygen with which she is likely to rebleed.  Outpatient follow-up with patient's primary ENT. Per TOC, unable to get an SNF but is willing to accept her with facial tent or mask with humidified oxygen and are only willing to take her with nasal cannula oxygen.  I discussed with Dr. Marcelline Deist, he indicates that the ideal situation would be stent followed by mask followed by nasal cannula but if unable to find placement then need to continue nasal cannula at SNF.  May resume aspirin.  Also he advises to stop Keflex, patient has completed >10 days treatment.  Hyponatremia: DD: Dehydration from reduced p.o. intake from intermittent nosebleeding versus SIADH from lung CA (less likely).  S/p IV saline bolus and hydration.  Serum sodium has stabilized in the low 130s.  IV fluids discontinued.  Serum sodium up to 132.  Acute blood loss anemia due to epistaxis: Presented with hemoglobin of 6.4.  S/p 2 units PRBC.  Hemoglobin has stabilized in the 9 g range.  Transfusion threshold is 7 g or less.  Chronic respiratory failure with hypoxia: Stable.  Please see above for oxygen needs  Essential hypertension: Controlled on doxazosin and metoprolol tartrate.  Hypothyroidism:  Continue Synthroid.  Dyslipidemia:  Continue statins.  Body mass index is 17.58 kg/m. /Underweight  Hypokalemia: Replaced.  Tobacco abuse: Smokes half a pack per day.  Agreeable to nicotine patch, ordered.   DVT prophylaxis: SCDs Code Status: Full Family Communication: None at bedside Disposition:  Status is: Inpatient  Remains inpatient appropriate because:Inpatient level of care appropriate due to severity of illness   Dispo: The patient is from: Home              Anticipated d/c is to: SNF.  As per PT evaluation, patient  is unsteady, desaturates easily despite oxygen, lives alone with little social support.  She is unsafe to DC home especially with new oxygen option, recurrent epistaxis and high risk for rehospitalization.  Thereby SNF.              Anticipated d/c date is: Possibly 03/18/2020.              Patient currently is medically stable for DC.        Consultants:   ENT  Procedures:   Right anterior nasal pack  Antimicrobials:   Keflex   Subjective:  No recurrence of epistaxis for the last couple of days.  No dyspnea.  Feels better with the facial tent rather than nasal cannula oxygen but explained to her that this may not be an option going to SNF.   Objective:   Vitals:   03/16/20 2341 03/17/20 0602 03/17/20 0856 03/17/20 1054  BP:  129/85  115/67  Pulse: 83 77  96  Resp: 20 14    Temp:  98 F (36.7 C)    TempSrc:  Oral    SpO2: 92% 92% 92%   Weight:      Height:        General exam: Elderly female, small built and frail sitting up comfortably in bed without distress. ENT: No epistaxis noted. Respiratory system: Clear to auscultation.  No increased work of breathing. Cardiovascular system: S1 & S2 heard, RRR. No JVD, murmurs, rubs, gallops or clicks. No pedal edema.   Gastrointestinal system: Abdomen is nondistended, soft and nontender. No organomegaly or masses felt. Normal bowel sounds heard. Central nervous system: Alert and oriented. No focal neurological deficits. Extremities: Symmetric 5 x 5 power. Skin: No rashes, lesions or ulcers Psychiatry: Judgement and insight appear normal. Mood & affect appropriate.     Data Reviewed:   I have personally reviewed following labs and imaging studies   CBC: Recent Labs  Lab 03/13/20 0222 03/13/20 0810 03/15/20 0516 03/16/20 0529 03/17/20 0541  WBC 9.7   < > 11.7* 9.1 6.5  NEUTROABS 7.6  --   --   --   --   HGB 7.1*   < > 9.9* 9.2* 9.7*  HCT 20.3*   < > 31.3* 29.9* 31.1*  MCV 91.4   < > 94.3 95.5 94.8  PLT 312   <  > 279 259 252   < > = values in this interval not displayed.    Basic Metabolic Panel: Recent Labs  Lab 03/15/20 0516 03/16/20 0529 03/17/20 0541  NA 130* 129* 132*  K 3.8 3.9 4.3  CL 97* 97* 101  CO2 $Re'24 24 26  'wyt$ GLUCOSE 100* 100* 95  BUN $Re'9 8 8  'HaO$ CREATININE 0.63 0.61 0.59  CALCIUM 8.4* 8.2* 8.7*    Liver Function Tests: No results for input(s): AST, ALT, ALKPHOS, BILITOT, PROT, ALBUMIN in the last 168 hours.  CBG: No results for input(s): GLUCAP in the last 168 hours.  Microbiology Studies:   Recent Results (from the past 240 hour(s))  SARS Coronavirus 2 by RT PCR (hospital order, performed  in [facility] lab) Nasopharyngeal Nasopharyngeal Swab     Status: None   Collection Time: 03/13/20  2:22 AM   Specimen: Nasopharyngeal Swab  Result Value Ref Range Status   SARS Coronavirus 2 NEGATIVE NEGATIVE Final    Comment: (NOTE) SARS-CoV-2 target nucleic acids are NOT DETECTED.  The SARS-CoV-2 RNA is generally detectable in upper and lower respiratory specimens during the acute phase of infection. The lowest concentration of SARS-CoV-2 viral copies this assay can detect is 250 copies / mL. A negative result does not preclude SARS-CoV-2 infection and should not be used as the sole basis for treatment or other patient management decisions.  A negative result may occur with improper specimen collection / handling, submission of specimen other than nasopharyngeal swab, presence of viral mutation(s) within the areas targeted by this assay, and inadequate number of viral copies (<250 copies / mL). A negative result must be combined with clinical observations, patient history, and epidemiological information.  Fact Sheet for Patients:   StrictlyIdeas.no  Fact Sheet for Healthcare Providers: BankingDealers.co.za  This test is not yet approved or  cleared by the [place] FDA and has been authorized for detection and/or  diagnosis of SARS-CoV-2 by FDA under an Emergency Use Authorization (EUA).  This EUA will remain in effect (meaning this test can be used) for the duration of the COVID-19 declaration under Section 564(b)(1) of the Act, 21 U.S.C. section 360bbb-3(b)(1), unless the authorization is terminated or revoked sooner.  Performed at [facility], [place] [address], [place], [place] [address]      Radiology Studies:  No results found.   Scheduled Meds:   . arformoterol  15 mcg Nebulization BID  . bacitracin   Topical BID  . cephALEXin  500 mg Oral QID  . doxazosin  8 mg Oral Daily  . levothyroxine  50 mcg Oral Q0600  . mouth rinse  15 mL Mouth Rinse BID  . metoprolol tartrate  25 mg Oral Daily  . multivitamin with minerals  1 tablet Oral Daily  . nicotine  14 mg Transdermal Daily  . pantoprazole  40 mg Oral Daily  . rosuvastatin  10 mg Oral Daily  . tamoxifen  20 mg Oral Daily  . umeclidinium bromide  1 puff Inhalation Daily    Continuous Infusions:      LOS: 3 days     Vernell Leep, MD, [place], [facility]. [facility]    To contact the attending provider between 7A-7P or the covering provider during after hours 7P-7A, please log into the web site [email or web address] and access using universal [place] password for that web site. If you do not have the password, please call the hospital operator.  03/17/2020, 6:21 PM

## 2020-03-17 NOTE — TOC Progression Note (Signed)
Transition of Care [facility]) - Progression Note    Patient Details  Name: Kendra Watts MRN: [id number] Date of Birth: [birth year]  Transition of Care [facility]) CM/SW Contact  Shade Flood, LCSW Phone Number: 03/17/2020, 3:21 PM  Clinical Narrative:     TOC following. Provided pt with SNF bed offers and she has selected [facility]. Checked in to the option for Humidified O2 on face tent at the SNF and have not been able to find a facility that can offer this. Updated MD who states pt can dc on [place] O2.  Janie at [facility] states that they can accept pt on Saturday. Weekend TOC to call Admissions phone at dc to coordinate.   TOC will follow up tomorrow.  Expected Discharge Plan: Skilled Nursing Facility Barriers to Discharge: Continued Medical Work up  Expected Discharge Plan and Services Expected Discharge Plan: [facility]   Discharge Planning Services: CM Consult   Living arrangements for the past 2 months: Apartment                                       Social Determinants of Health (SDOH) Interventions    Readmission Risk Interventions Readmission Risk Prevention Plan 03/16/2020  Transportation Screening Complete  PCP or Specialist Appt within 3-5 Days Complete  HRI or [place] Complete  Social Work Consult for [place] Planning/Counseling Complete  Palliative Care Screening Not Applicable  Medication Review Press photographer) Complete  Some recent data might be hidden

## 2020-03-18 DIAGNOSIS — D649 Anemia, unspecified: Secondary | ICD-10-CM | POA: Diagnosis not present

## 2020-03-18 DIAGNOSIS — R0902 Hypoxemia: Secondary | ICD-10-CM | POA: Diagnosis not present

## 2020-03-18 DIAGNOSIS — C3490 Malignant neoplasm of unspecified part of unspecified bronchus or lung: Secondary | ICD-10-CM | POA: Diagnosis not present

## 2020-03-18 DIAGNOSIS — I739 Peripheral vascular disease, unspecified: Secondary | ICD-10-CM | POA: Diagnosis not present

## 2020-03-18 DIAGNOSIS — R2689 Other abnormalities of gait and mobility: Secondary | ICD-10-CM | POA: Diagnosis not present

## 2020-03-18 DIAGNOSIS — C3492 Malignant neoplasm of unspecified part of left bronchus or lung: Secondary | ICD-10-CM | POA: Diagnosis not present

## 2020-03-18 DIAGNOSIS — J449 Chronic obstructive pulmonary disease, unspecified: Secondary | ICD-10-CM | POA: Diagnosis not present

## 2020-03-18 DIAGNOSIS — I6529 Occlusion and stenosis of unspecified carotid artery: Secondary | ICD-10-CM | POA: Diagnosis not present

## 2020-03-18 DIAGNOSIS — R2681 Unsteadiness on feet: Secondary | ICD-10-CM | POA: Diagnosis not present

## 2020-03-18 DIAGNOSIS — C3491 Malignant neoplasm of unspecified part of right bronchus or lung: Secondary | ICD-10-CM | POA: Diagnosis not present

## 2020-03-18 DIAGNOSIS — M6281 Muscle weakness (generalized): Secondary | ICD-10-CM | POA: Diagnosis not present

## 2020-03-18 DIAGNOSIS — I509 Heart failure, unspecified: Secondary | ICD-10-CM | POA: Diagnosis not present

## 2020-03-18 DIAGNOSIS — Z20828 Contact with and (suspected) exposure to other viral communicable diseases: Secondary | ICD-10-CM | POA: Diagnosis not present

## 2020-03-18 DIAGNOSIS — I10 Essential (primary) hypertension: Secondary | ICD-10-CM | POA: Diagnosis not present

## 2020-03-18 DIAGNOSIS — M6259 Muscle wasting and atrophy, not elsewhere classified, multiple sites: Secondary | ICD-10-CM | POA: Diagnosis not present

## 2020-03-18 DIAGNOSIS — E871 Hypo-osmolality and hyponatremia: Secondary | ICD-10-CM | POA: Diagnosis not present

## 2020-03-18 DIAGNOSIS — K219 Gastro-esophageal reflux disease without esophagitis: Secondary | ICD-10-CM | POA: Diagnosis not present

## 2020-03-18 DIAGNOSIS — Z7401 Bed confinement status: Secondary | ICD-10-CM | POA: Diagnosis not present

## 2020-03-18 DIAGNOSIS — C50212 Malignant neoplasm of upper-inner quadrant of left female breast: Secondary | ICD-10-CM | POA: Diagnosis not present

## 2020-03-18 DIAGNOSIS — J9610 Chronic respiratory failure, unspecified whether with hypoxia or hypercapnia: Secondary | ICD-10-CM | POA: Diagnosis not present

## 2020-03-18 DIAGNOSIS — J9611 Chronic respiratory failure with hypoxia: Secondary | ICD-10-CM | POA: Diagnosis not present

## 2020-03-18 DIAGNOSIS — R040 Epistaxis: Secondary | ICD-10-CM | POA: Diagnosis not present

## 2020-03-18 DIAGNOSIS — S72115S Nondisplaced fracture of greater trochanter of left femur, sequela: Secondary | ICD-10-CM | POA: Diagnosis not present

## 2020-03-18 DIAGNOSIS — E785 Hyperlipidemia, unspecified: Secondary | ICD-10-CM | POA: Diagnosis not present

## 2020-03-18 DIAGNOSIS — M810 Age-related osteoporosis without current pathological fracture: Secondary | ICD-10-CM | POA: Diagnosis not present

## 2020-03-18 DIAGNOSIS — E039 Hypothyroidism, unspecified: Secondary | ICD-10-CM | POA: Diagnosis not present

## 2020-03-18 DIAGNOSIS — D62 Acute posthemorrhagic anemia: Secondary | ICD-10-CM | POA: Diagnosis not present

## 2020-03-18 DIAGNOSIS — M2550 Pain in unspecified joint: Secondary | ICD-10-CM | POA: Diagnosis not present

## 2020-03-18 DIAGNOSIS — E782 Mixed hyperlipidemia: Secondary | ICD-10-CM | POA: Diagnosis not present

## 2020-03-18 DIAGNOSIS — W19XXXA Unspecified fall, initial encounter: Secondary | ICD-10-CM | POA: Diagnosis not present

## 2020-03-18 LAB — SARS CORONAVIRUS 2 BY RT PCR (HOSPITAL ORDER, PERFORMED IN ~~LOC~~ HOSPITAL LAB): SARS Coronavirus 2: NEGATIVE

## 2020-03-18 MED ORDER — NICOTINE 14 MG/24HR TD PT24: 14.0000 mg | MEDICATED_PATCH | Freq: Every day | TRANSDERMAL | 0 refills | Status: DC

## 2020-03-18 MED ORDER — ASPIRIN 81 MG PO TABS: 81.0000 mg | ORAL_TABLET | Freq: Every day | ORAL | Status: AC

## 2020-03-18 MED ORDER — BACITRACIN ZINC 500 UNIT/GM EX OINT: TOPICAL_OINTMENT | Freq: Two times a day (BID) | CUTANEOUS | Status: AC

## 2020-03-18 NOTE — Progress Notes (Signed)
Called again to give report to the receiving facility and spoke with the secretary, Manuela Schwartz. She asked if the RN there could give me a call back. [facility] is here to transport the patient to room 3225. The receiving room number has been updated per Manuela Schwartz.

## 2020-03-18 NOTE — Discharge Instructions (Signed)

## 2020-03-18 NOTE — TOC Transition Note (Signed)
Transition of Care [facility]) - CM/SW Discharge Note   Patient Details  Name: Kendra Watts MRN: [id number] Date of Birth: 23-Oct-1945  [facility]) CM/SW Contact:  Shade Flood, LCSW Phone Number: 03/18/2020, 1:05 PM   Clinical Narrative:     Pt stable for dc to SNF today per MD. Updated [name] at [facility] and they can accept. Pt going to room 3217. Report can be called to [phone].   Updated pt who remains in agreement with the plan. She did not have any family that she wanted notified.  DC envelope will be placed in the RN station. DC clinical will be sent electronically. [facility] arranged for 1500.  There are no other TOC needs for dc.  Final next level of care: Skilled Nursing Facility Barriers to Discharge: Barriers Resolved   Patient Goals and CMS Choice Patient states their goals for this hospitalization and ongoing recovery are:: go to rehab CMS Medicare.gov Compare Post Acute Care list provided to:: Patient Choice offered to / list presented to : Patient  Discharge Placement PASRR number recieved: 03/16/20            Patient chooses bed at: [facility] Patient to be transferred to facility by: [place] Name of family member notified: only notified patient Patient and family notified of of transfer: 03/18/20  Discharge Plan and Services   Discharge Planning Services: CM Consult                                 Social Determinants of Health ([place]) Interventions     Readmission Risk Interventions Readmission Risk Prevention Plan 03/16/2020  Transportation Screening Complete  PCP or Specialist Appt within 3-5 Days Complete  HRI or [place] Complete  Social Work Consult for [place] Planning/Counseling Complete  Palliative Care Screening Not Applicable  Medication Review Press photographer) Complete  Some recent data might be hidden

## 2020-03-18 NOTE — Discharge Summary (Signed)
Physician Discharge Summary  Kendra Watts PYP:[id number] DOB: [birth year]  PCP: Merrilee Seashore, MD  Admitted from: Home Discharged to: SNF  Admit date: 03/13/2020 Discharge date: 03/18/2020  Recommendations for Outpatient Follow-up:    Contact information for follow-up providers    [facility], [place], [place]. Schedule an appointment as soon as possible for a visit in 1 week(s).   Specialty: Otolaryngology Contact information: [place] [address] [phone]        MD at SNF. Schedule an appointment as soon as possible for a visit in 3 day(s).   Why: To be seen with repeat labs (CBC & BMP).       Merrilee Seashore, MD. Schedule an appointment as soon as possible for a visit.   Specialty: Internal Medicine Why: Upon discharge from SNF. Contact information: [place] [address] [phone]            Contact information for after-discharge care    Destination    [facility] .   Service: Skilled Nursing Contact information: [place] [phone]                 Already has outpatient follow-up appointments with her medical and radiation oncologists.  Home Health: N/A Equipment/Devices: TBD at SNF  Discharge Condition: Improved and stable CODE STATUS: Full Diet recommendation: Heart healthy diet  Discharge Diagnoses:  Principal Problem:   Epistaxis Active Problems:   Chronic respiratory failure with hypoxia (HCC)   Hyponatremia   Squamous cell lung cancer, left (HCC)   Adenocarcinoma, lung, right (HCC)   Acute blood loss anemia   Brief Summary: 74 year old female with PMH of BRCA s/p lumpectomy maintained on tamoxifen, adenocarcinoma of lung and squamous cell carcinoma of lung (at same time) s/p radiation therapy, COPD, chronic respiratory failure with hypoxia on home oxygen 2-4 L/min, seen in ED on  8/13 for epistaxis, both nares were packed, bleeding reoccurred on 8/17 and also had associated weakness and dyspnea with cough.  She was admitted for profuse epistaxis, acute blood loss anemia (hemoglobin 6.4) and severe hyponatremia (sodium 118).  Right anterior nare was packed, epistaxis resolved.  Transfused PRBC, hemoglobin stable.  Hyponatremia improved.  ENT discontinued nasal packing on 8/26.  Patient has poor social situation, unsteady of gait and PT recommends SNF, TOC consulted.  Patient medically stable for DC but TOC team unable to find any SNF which will take the patient with facemask or tent with humidified oxygen and they will only take her with the nasal cannula oxygen.  PT reiterates that she is unsafe to DC home.  Thereby DC to SNF on 8/28 with nasal cannula oxygen.    Assessment & Plan:   Epistaxis, recurrent: ?  Only from right nostril.  Right anterior nare packed by EDP.  ENT consulted 8/23 and advised that it would be difficult to control patient's recurrent epistaxis without controlling her hypertension, removing nasal cannula oxygen (may not be a long-term option) and patient to quit smoking.  Advised holding aspirin for a week.  Dr. Marcelline Deist, ENT removed nasal packing on 8/26, recommends bilateral nostril bacitracin ointment twice daily, should use only a face tent for humidified oxygen or air and not facemask or nasal cannula oxygen with which she is likely to rebleed.   Per TOC, unable to get an SNF that is willing to accept her with facial tent or mask with humidified oxygen and are only  willing to take her with nasal cannula oxygen.  I discussed with Dr. Caldwell, he indicates that the ideal situation would be stent followed by mask followed by nasal cannula but if unable to find placement then need to continue nasal cannula at SNF.  May resume aspirin on 8/31.  Also he advises to stop Keflex, patient has completed >10 days treatment. No epistaxis for several days now. Patient  will DC to SNF on humidified oxygen via nasal cannula at 2 to 4 L/min to be titrated to maintain oxygen saturations between 89-92%. Outpatient follow-up with her primary ENT whom she had seen just prior to this admission.  Hyponatremia: DD: Dehydration from reduced p.o. intake from intermittent nosebleeding versus SIADH from lung CA (less likely).  S/p IV saline bolus and hydration.  Serum sodium has stabilized in the low 130s.  IV fluids discontinued.  Serum sodium up to 132. Home dose of Lasix 20 mg being resumed which should help if she has SIADH. Close follow-up of BMP as outpatient.  Acute blood loss anemia due to epistaxis: Presented with hemoglobin of 6.4.  S/p 2 units PRBC.  Hemoglobin has stabilized in the 9 g range.  Transfusion threshold is 7 g or less. Follow CBC periodically at SNF.  Chronic respiratory failure with hypoxia: Stable.  Please see above for oxygen needs  Essential hypertension: Controlled on doxazosin and metoprolol tartrate.  Hypothyroidism:  Continue Synthroid.  Dyslipidemia:  Continue statins.  Body mass index is 17.58 kg/m. /Underweight  Hypokalemia: Replaced.  Tobacco abuse: Smokes half a pack per day.  Agreeable to nicotine patch, ordered.    Consultants:   ENT  Procedures:   Right anterior nasal pack-discontinued 8/26.   Discharge Instructions  Discharge Instructions    (HEART FAILURE PATIENTS) Call MD:  Anytime you have any of the following symptoms: 1) 3 pound weight gain in 24 hours or 5 pounds in 1 week 2) shortness of breath, with or without a dry hacking cough 3) swelling in the hands, feet or stomach 4) if you have to sleep on extra pillows at night in order to breathe.   Complete by: As directed    Call MD for:   Complete by: As directed    Recurrent nosebleeds.   Call MD for:  difficulty breathing, headache or visual disturbances   Complete by: As directed    Call MD for:  extreme fatigue   Complete by: As  directed    Call MD for:  persistant dizziness or light-headedness   Complete by: As directed    Call MD for:  persistant nausea and vomiting   Complete by: As directed    Call MD for:  severe uncontrolled pain   Complete by: As directed    Call MD for:  temperature >100.4   Complete by: As directed    Diet - low sodium heart healthy   Complete by: As directed    Discharge instructions   Complete by: As directed    Continue humidified oxygen via nasal cannula at 2 to 4 L/min and titrate oxygen saturations to between 89-92%.   Increase activity slowly   Complete by: As directed        Medication List    STOP taking these medications   cephALEXin 500 MG capsule Commonly known as: KEFLEX     TAKE these medications   albuterol 108 (90 Base) MCG/ACT inhaler Commonly known as: VENTOLIN HFA Inhale 2 puffs into the lungs every 6 (six) hours as needed   for wheezing or shortness of breath.   aspirin 81 MG tablet Take 1 tablet (81 mg total) by mouth daily. Start taking on: March 21, 2020 What changed: These instructions start on March 21, 2020. If you are unsure what to do until then, ask your doctor or other care provider.   bacitracin ointment Apply topically 2 (two) times daily for 7 days. Fill both sides of the nasal cavity with ointment twice daily   CALCIUM 1200+D3 PO Take 1 tablet by mouth daily.   Cardura 8 MG tablet Generic drug: doxazosin Take 8 mg by mouth daily.   Crestor 10 MG tablet Generic drug: rosuvastatin Take 10 mg by mouth daily.   furosemide 20 MG tablet Commonly known as: Lasix Take 1 tablet (20 mg total) by mouth daily.   ibandronate 150 MG tablet Commonly known as: BONIVA Take 150 mg by mouth every 30 (thirty) days.   levothyroxine 50 MCG tablet Commonly known as: SYNTHROID Take 50 mcg by mouth daily.   metoprolol tartrate 25 MG tablet Commonly known as: LOPRESSOR Take 25 mg by mouth daily.   multivitamin with minerals tablet Take 1  tablet by mouth daily.   nicotine 14 mg/24hr patch Commonly known as: NICODERM CQ - dosed in mg/24 hours Place 1 patch (14 mg total) onto the skin daily. Start taking on: March 19, 2020   pantoprazole 40 MG tablet Commonly known as: PROTONIX Take 1 tablet (40 mg total) by mouth daily.   Stiolto Respimat 2.5-2.5 MCG/ACT Aers Generic drug: Tiotropium Bromide-Olodaterol Inhale 2 puffs into the lungs daily.   tamoxifen 20 MG tablet Commonly known as: NOLVADEX Take 1 tablet (20 mg total) by mouth daily.      No Known Allergies    Procedures/Studies: DG Chest Port 1 View  Result Date: 03/13/2020 CLINICAL DATA:  Shortness of breath EXAM: PORTABLE CHEST 1 VIEW COMPARISON:  None. FINDINGS: The heart size and mediastinal contours are within normal limits. Both lungs are clear. The visualized skeletal structures are unremarkable. Biapical surgical clips. IMPRESSION: No active disease. Electronically Signed   By: Ulyses Jarred M.D.   On: 03/13/2020 02:49      Subjective: Denies complaints. No further nosebleeds for several days. Denies dyspnea. As per RN, no acute issues reported.  Discharge Exam:  Vitals:   03/17/20 1054 03/17/20 2011 03/17/20 2101 03/18/20 0514  BP: 115/67 131/87  132/86  Pulse: 96 85  79  Resp:  18  20  Temp:  97.7 F (36.5 C)  98.3 F (36.8 C)  TempSrc:  Oral  Oral  SpO2:  (!) 89% 90% 97%  Weight:      Height:        General exam: Elderly female, small built and frail sitting up comfortably in bed without distress. ENT: No epistaxis noted. Respiratory system: Clear to auscultation.  No increased work of breathing. Cardiovascular system: S1 & S2 heard, RRR. No JVD, murmurs, rubs, gallops or clicks. No pedal edema.   Gastrointestinal system: Abdomen is nondistended, soft and nontender. No organomegaly or masses felt. Normal bowel sounds heard. Central nervous system: Alert and oriented. No focal neurological deficits. Extremities: Symmetric 5 x 5  power. Skin: No rashes, lesions or ulcers Psychiatry: Judgement and insight appear normal. Mood & affect appropriate.     The results of significant diagnostics from this hospitalization (including imaging, microbiology, ancillary and laboratory) are listed below for reference.     Microbiology: Recent Results (from the past 240 hour(s))  SARS Coronavirus 2  by RT PCR (hospital order, performed in [facility] hospital lab) Nasopharyngeal Nasopharyngeal Swab     Status: None   Collection Time: 03/13/20  2:22 AM   Specimen: Nasopharyngeal Swab  Result Value Ref Range Status   SARS Coronavirus 2 NEGATIVE NEGATIVE Final    Comment: (NOTE) SARS-CoV-2 target nucleic acids are NOT DETECTED.  The SARS-CoV-2 RNA is generally detectable in upper and lower respiratory specimens during the acute phase of infection. The lowest concentration of SARS-CoV-2 viral copies this assay can detect is 250 copies / mL. A negative result does not preclude SARS-CoV-2 infection and should not be used as the sole basis for treatment or other patient management decisions.  A negative result may occur with improper specimen collection / handling, submission of specimen other than nasopharyngeal swab, presence of viral mutation(s) within the areas targeted by this assay, and inadequate number of viral copies (<250 copies / mL). A negative result must be combined with clinical observations, patient history, and epidemiological information.  Fact Sheet for Patients:   StrictlyIdeas.no  Fact Sheet for Healthcare Providers: BankingDealers.co.za  This test is not yet approved or  cleared by the [place] FDA and has been authorized for detection and/or diagnosis of SARS-CoV-2 by FDA under an Emergency Use Authorization (EUA).  This EUA will remain in effect (meaning this test can be used) for the duration of the COVID-19 declaration under Section 564(b)(1) of the  Act, 21 U.S.C. section 360bbb-3(b)(1), unless the authorization is terminated or revoked sooner.  Performed at [facility], [place] [address] [place], [place] [address]   SARS Coronavirus 2 by RT PCR (hospital order, performed in [facility] hospital lab) Nasopharyngeal Nasopharyngeal Swab     Status: None   Collection Time: 03/18/20  3:17 AM   Specimen: Nasopharyngeal Swab  Result Value Ref Range Status   SARS Coronavirus 2 NEGATIVE NEGATIVE Final    Comment: (NOTE) SARS-CoV-2 target nucleic acids are NOT DETECTED.  The SARS-CoV-2 RNA is generally detectable in upper and lower respiratory specimens during the acute phase of infection. The lowest concentration of SARS-CoV-2 viral copies this assay can detect is 250 copies / mL. A negative result does not preclude SARS-CoV-2 infection and should not be used as the sole basis for treatment or other patient management decisions.  A negative result may occur with improper specimen collection / handling, submission of specimen other than nasopharyngeal swab, presence of viral mutation(s) within the areas targeted by this assay, and inadequate number of viral copies (<250 copies / mL). A negative result must be combined with clinical observations, patient history, and epidemiological information.  Fact Sheet for Patients:   StrictlyIdeas.no  Fact Sheet for Healthcare Providers: BankingDealers.co.za  This test is not yet approved or  cleared by the [place] FDA and has been authorized for detection and/or diagnosis of SARS-CoV-2 by FDA under an Emergency Use Authorization (EUA).  This EUA will remain in effect (meaning this test can be used) for the duration of the COVID-19 declaration under Section 564(b)(1) of the Act, 21 U.S.C. section 360bbb-3(b)(1), unless the authorization is terminated or revoked sooner.  Performed at [facility], [place]  [address] [place], [place] [address]      Labs: CBC: Recent Labs  Lab 03/13/20 0222 03/13/20 0810 03/13/20 1720 03/14/20 0444 03/15/20 0516 03/16/20 0529 03/17/20 0541  WBC 9.7  --   --  12.9* 11.7* 9.1 6.5  NEUTROABS 7.6  --   --   --   --   --   --  HGB 7.1*   < > 9.7* 10.1* 9.9* 9.2* 9.7*  HCT 20.3*   < > 28.2* 30.4* 31.3* 29.9* 31.1*  MCV 91.4  --   --  90.5 94.3 95.5 94.8  PLT 312  --   --  284 279 259 252   < > = values in this interval not displayed.    Basic Metabolic Panel: Recent Labs  Lab 03/13/20 2358 03/14/20 0444 03/15/20 0516 03/16/20 0529 03/17/20 0541  NA 132* 133* 130* 129* 132*  K 4.2 3.3* 3.8 3.9 4.3  CL 95* 95* 97* 97* 101  CO2 24 26 24 24 26  GLUCOSE 84 85 100* 100* 95  BUN 8 8 9 8 8  CREATININE 0.72 0.63 0.63 0.61 0.59  CALCIUM 8.6* 8.4* 8.4* 8.2* 8.7*    Urinalysis    Component Value Date/Time   COLORURINE STRAW (A) 03/13/2020 1128   APPEARANCEUR CLEAR 03/13/2020 1128   LABSPEC 1.006 03/13/2020 1128   PHURINE 6.0 03/13/2020 1128   GLUCOSEU NEGATIVE 03/13/2020 1128   HGBUR NEGATIVE 03/13/2020 1128   BILIRUBINUR NEGATIVE 03/13/2020 1128   KETONESUR NEGATIVE 03/13/2020 1128   PROTEINUR NEGATIVE 03/13/2020 1128   UROBILINOGEN 1.0 03/27/2012 1119   NITRITE NEGATIVE 03/13/2020 1128   LEUKOCYTESUR NEGATIVE 03/13/2020 1128    At patient's request, I attempted to reach patient's nephew but did not get him and left VM message to call back to discuss her case if interested.  Time coordinating discharge: 40 minutes  SIGNED:   , MD, FACP, SFHM. [facility]  To contact the attending provider between 7A-7P or the covering provider during after hours 7P-7A, please log into the web site [email or web address] and access using universal [place] password for that web site. If you do not have the password, please call the hospital operator.  

## 2020-03-18 NOTE — Progress Notes (Signed)
Attempted to call report to [facility]. This RN spoke with the receptionist and she said she would have the RN at the receiving facility to call me back for report.

## 2020-03-21 DIAGNOSIS — I10 Essential (primary) hypertension: Secondary | ICD-10-CM | POA: Diagnosis not present

## 2020-03-21 DIAGNOSIS — E782 Mixed hyperlipidemia: Secondary | ICD-10-CM | POA: Diagnosis not present

## 2020-03-21 DIAGNOSIS — I509 Heart failure, unspecified: Secondary | ICD-10-CM | POA: Diagnosis not present

## 2020-03-21 DIAGNOSIS — C50212 Malignant neoplasm of upper-inner quadrant of left female breast: Secondary | ICD-10-CM | POA: Diagnosis not present

## 2020-03-21 DIAGNOSIS — J9610 Chronic respiratory failure, unspecified whether with hypoxia or hypercapnia: Secondary | ICD-10-CM | POA: Diagnosis not present

## 2020-03-21 DIAGNOSIS — I739 Peripheral vascular disease, unspecified: Secondary | ICD-10-CM | POA: Diagnosis not present

## 2020-03-21 DIAGNOSIS — M810 Age-related osteoporosis without current pathological fracture: Secondary | ICD-10-CM | POA: Diagnosis not present

## 2020-03-21 DIAGNOSIS — E039 Hypothyroidism, unspecified: Secondary | ICD-10-CM | POA: Diagnosis not present

## 2020-03-21 DIAGNOSIS — J449 Chronic obstructive pulmonary disease, unspecified: Secondary | ICD-10-CM | POA: Diagnosis not present

## 2020-03-21 DIAGNOSIS — D649 Anemia, unspecified: Secondary | ICD-10-CM | POA: Diagnosis not present

## 2020-03-21 DIAGNOSIS — K219 Gastro-esophageal reflux disease without esophagitis: Secondary | ICD-10-CM | POA: Diagnosis not present

## 2020-03-21 DIAGNOSIS — R040 Epistaxis: Secondary | ICD-10-CM | POA: Diagnosis not present

## 2020-03-22 ENCOUNTER — Ambulatory Visit (HOSPITAL_COMMUNITY): Payer: Medicare Other

## 2020-03-22 DIAGNOSIS — J9610 Chronic respiratory failure, unspecified whether with hypoxia or hypercapnia: Secondary | ICD-10-CM | POA: Diagnosis not present

## 2020-03-22 DIAGNOSIS — E871 Hypo-osmolality and hyponatremia: Secondary | ICD-10-CM | POA: Diagnosis not present

## 2020-03-22 DIAGNOSIS — D62 Acute posthemorrhagic anemia: Secondary | ICD-10-CM | POA: Diagnosis not present

## 2020-03-22 DIAGNOSIS — C3490 Malignant neoplasm of unspecified part of unspecified bronchus or lung: Secondary | ICD-10-CM | POA: Diagnosis not present

## 2020-03-22 DIAGNOSIS — R040 Epistaxis: Secondary | ICD-10-CM | POA: Diagnosis not present

## 2020-03-22 DIAGNOSIS — E039 Hypothyroidism, unspecified: Secondary | ICD-10-CM | POA: Diagnosis not present

## 2020-03-23 ENCOUNTER — Other Ambulatory Visit: Payer: Self-pay | Admitting: *Deleted

## 2020-03-23 DIAGNOSIS — I10 Essential (primary) hypertension: Secondary | ICD-10-CM | POA: Diagnosis not present

## 2020-03-23 DIAGNOSIS — I509 Heart failure, unspecified: Secondary | ICD-10-CM | POA: Diagnosis not present

## 2020-03-23 DIAGNOSIS — D649 Anemia, unspecified: Secondary | ICD-10-CM | POA: Diagnosis not present

## 2020-03-23 DIAGNOSIS — J449 Chronic obstructive pulmonary disease, unspecified: Secondary | ICD-10-CM | POA: Diagnosis not present

## 2020-03-23 DIAGNOSIS — E782 Mixed hyperlipidemia: Secondary | ICD-10-CM | POA: Diagnosis not present

## 2020-03-23 NOTE — Patient Outreach (Signed)
Member screened for potential [facility] Care Management needs as a benefit of [place] Medicare.  Per Patient Kendra Watts member resides in [facility] [facility].   Communication sent to [facility]  SW to collaborate about anticipated dc plans and potential [facility] Care Management needs.  Will continue while member resides in SNF.  Kendra Rolling, MSN-Ed, RN,BSN [place] Acute Care Coordinator [phone] [facility]) [phone]  (Toll free office)

## 2020-03-28 ENCOUNTER — Inpatient Hospital Stay: Payer: Medicare Other | Attending: Hematology and Oncology | Admitting: Hematology and Oncology

## 2020-03-28 NOTE — Assessment & Plan Note (Deleted)
09/22/2015:Left lumpectomy [name]): IDC grade 1, 1.1 minutes, ADH, LCIS, margins negative, 0/3 lymph nodes, ER 100%, PR 90%, HER-2 negative, Ki-67 10%, T1 cN0 stage IA, Oncotype DX score 14, 9% ROR Adjuvant radiation therapy 10/30/2015 to 11/28/2015  Current treatment: Tamoxifen 20 mg daily started 11/2015 Tamoxifen toxicities: None Breast cancer surveillance: Mammograms 10/28/2019: Benign breast density category C

## 2020-03-28 NOTE — Assessment & Plan Note (Deleted)
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, left upper lobe nodule 2.5 x 2.1 cm SUV 9.8, no lymph node hypermetabolism, 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 and TTF-1 is negative  Stereotactic radiosurgery April 2020  Hospitalization 03/13/2020-03/18/2020: Severe epistaxis discharged to SNF (hemoglobin 6.4 on admission status post 2 units PRBC)  CT chest 11/15/2019: Mild interval growth of the irregular 1 cm solid peripheral right lower lobe lung nodule.  Previously treated bilateral upper lobe lung nodule stable, mediastinal lymph nodes stable healing the right 3rd-7th ribs, new T3 vertebral compression fracture

## 2020-04-04 ENCOUNTER — Ambulatory Visit: Payer: Medicare Other | Admitting: Cardiovascular Disease

## 2020-04-04 ENCOUNTER — Other Ambulatory Visit: Payer: Self-pay | Admitting: *Deleted

## 2020-04-04 NOTE — Patient Outreach (Signed)
[place] Coordinator follow up. Member screened for potential [facility] Care Management needs as a benefit of [place] Medicare.  [facility] SN[facility] SW reports transition plan is to return home.   Will plan outreach to member to discuss potential [facility] Care Management services.   [name], MSN-Ed, RN,BSN [place] Acute Care Coordinator [phone] [facility]) [phone]  (Toll free office)

## 2020-04-07 DIAGNOSIS — D649 Anemia, unspecified: Secondary | ICD-10-CM | POA: Diagnosis not present

## 2020-04-07 DIAGNOSIS — I10 Essential (primary) hypertension: Secondary | ICD-10-CM | POA: Diagnosis not present

## 2020-04-07 DIAGNOSIS — W19XXXA Unspecified fall, initial encounter: Secondary | ICD-10-CM | POA: Diagnosis not present

## 2020-04-07 DIAGNOSIS — I739 Peripheral vascular disease, unspecified: Secondary | ICD-10-CM | POA: Diagnosis not present

## 2020-04-07 DIAGNOSIS — E782 Mixed hyperlipidemia: Secondary | ICD-10-CM | POA: Diagnosis not present

## 2020-04-07 DIAGNOSIS — E039 Hypothyroidism, unspecified: Secondary | ICD-10-CM | POA: Diagnosis not present

## 2020-04-07 DIAGNOSIS — I509 Heart failure, unspecified: Secondary | ICD-10-CM | POA: Diagnosis not present

## 2020-04-07 DIAGNOSIS — J449 Chronic obstructive pulmonary disease, unspecified: Secondary | ICD-10-CM | POA: Diagnosis not present

## 2020-04-07 DIAGNOSIS — M810 Age-related osteoporosis without current pathological fracture: Secondary | ICD-10-CM | POA: Diagnosis not present

## 2020-04-08 ENCOUNTER — Emergency Department (HOSPITAL_COMMUNITY): Payer: Medicare Other

## 2020-04-08 ENCOUNTER — Encounter (HOSPITAL_COMMUNITY): Payer: Self-pay | Admitting: Radiology

## 2020-04-08 ENCOUNTER — Inpatient Hospital Stay (HOSPITAL_COMMUNITY)
Admission: EM | Admit: 2020-04-08 | Discharge: 2020-04-12 | DRG: 536 | Disposition: A | Discharge status: Skilled Nursing Facility | Payer: Medicare Other | Attending: Family Medicine | Admitting: Family Medicine

## 2020-04-08 ENCOUNTER — Other Ambulatory Visit: Payer: Self-pay

## 2020-04-08 DIAGNOSIS — Z85118 Personal history of other malignant neoplasm of bronchus and lung: Secondary | ICD-10-CM

## 2020-04-08 DIAGNOSIS — Y92009 Unspecified place in unspecified non-institutional (private) residence as the place of occurrence of the external cause: Secondary | ICD-10-CM

## 2020-04-08 DIAGNOSIS — Z803 Family history of malignant neoplasm of breast: Secondary | ICD-10-CM

## 2020-04-08 DIAGNOSIS — R52 Pain, unspecified: Secondary | ICD-10-CM | POA: Diagnosis not present

## 2020-04-08 DIAGNOSIS — S3991XA Unspecified injury of abdomen, initial encounter: Secondary | ICD-10-CM | POA: Diagnosis not present

## 2020-04-08 DIAGNOSIS — S4992XA Unspecified injury of left shoulder and upper arm, initial encounter: Secondary | ICD-10-CM | POA: Diagnosis not present

## 2020-04-08 DIAGNOSIS — Z9981 Dependence on supplemental oxygen: Secondary | ICD-10-CM

## 2020-04-08 DIAGNOSIS — S2232XA Fracture of one rib, left side, initial encounter for closed fracture: Secondary | ICD-10-CM | POA: Diagnosis not present

## 2020-04-08 DIAGNOSIS — J3489 Other specified disorders of nose and nasal sinuses: Secondary | ICD-10-CM | POA: Diagnosis not present

## 2020-04-08 DIAGNOSIS — M7989 Other specified soft tissue disorders: Secondary | ICD-10-CM | POA: Diagnosis not present

## 2020-04-08 DIAGNOSIS — S2249XA Multiple fractures of ribs, unspecified side, initial encounter for closed fracture: Secondary | ICD-10-CM | POA: Diagnosis not present

## 2020-04-08 DIAGNOSIS — J449 Chronic obstructive pulmonary disease, unspecified: Secondary | ICD-10-CM | POA: Diagnosis not present

## 2020-04-08 DIAGNOSIS — S72112A Displaced fracture of greater trochanter of left femur, initial encounter for closed fracture: Principal | ICD-10-CM | POA: Diagnosis present

## 2020-04-08 DIAGNOSIS — R011 Cardiac murmur, unspecified: Secondary | ICD-10-CM | POA: Diagnosis present

## 2020-04-08 DIAGNOSIS — J439 Emphysema, unspecified: Secondary | ICD-10-CM | POA: Diagnosis not present

## 2020-04-08 DIAGNOSIS — Z79890 Hormone replacement therapy (postmenopausal): Secondary | ICD-10-CM

## 2020-04-08 DIAGNOSIS — I2510 Atherosclerotic heart disease of native coronary artery without angina pectoris: Secondary | ICD-10-CM | POA: Diagnosis not present

## 2020-04-08 DIAGNOSIS — Z923 Personal history of irradiation: Secondary | ICD-10-CM

## 2020-04-08 DIAGNOSIS — Z8249 Family history of ischemic heart disease and other diseases of the circulatory system: Secondary | ICD-10-CM

## 2020-04-08 DIAGNOSIS — W010XXA Fall on same level from slipping, tripping and stumbling without subsequent striking against object, initial encounter: Secondary | ICD-10-CM | POA: Diagnosis present

## 2020-04-08 DIAGNOSIS — R918 Other nonspecific abnormal finding of lung field: Secondary | ICD-10-CM | POA: Diagnosis present

## 2020-04-08 DIAGNOSIS — W19XXXA Unspecified fall, initial encounter: Secondary | ICD-10-CM | POA: Diagnosis not present

## 2020-04-08 DIAGNOSIS — S72115A Nondisplaced fracture of greater trochanter of left femur, initial encounter for closed fracture: Secondary | ICD-10-CM | POA: Diagnosis not present

## 2020-04-08 DIAGNOSIS — D649 Anemia, unspecified: Secondary | ICD-10-CM | POA: Diagnosis present

## 2020-04-08 DIAGNOSIS — I10 Essential (primary) hypertension: Secondary | ICD-10-CM | POA: Diagnosis present

## 2020-04-08 DIAGNOSIS — S2242XA Multiple fractures of ribs, left side, initial encounter for closed fracture: Secondary | ICD-10-CM | POA: Diagnosis present

## 2020-04-08 DIAGNOSIS — Z20822 Contact with and (suspected) exposure to covid-19: Secondary | ICD-10-CM | POA: Diagnosis not present

## 2020-04-08 DIAGNOSIS — M47816 Spondylosis without myelopathy or radiculopathy, lumbar region: Secondary | ICD-10-CM | POA: Diagnosis not present

## 2020-04-08 DIAGNOSIS — S72142A Displaced intertrochanteric fracture of left femur, initial encounter for closed fracture: Secondary | ICD-10-CM | POA: Diagnosis not present

## 2020-04-08 DIAGNOSIS — C3491 Malignant neoplasm of unspecified part of right bronchus or lung: Secondary | ICD-10-CM | POA: Diagnosis present

## 2020-04-08 DIAGNOSIS — C3492 Malignant neoplasm of unspecified part of left bronchus or lung: Secondary | ICD-10-CM | POA: Diagnosis present

## 2020-04-08 DIAGNOSIS — Z79899 Other long term (current) drug therapy: Secondary | ICD-10-CM

## 2020-04-08 DIAGNOSIS — E039 Hypothyroidism, unspecified: Secondary | ICD-10-CM | POA: Diagnosis present

## 2020-04-08 DIAGNOSIS — Z7982 Long term (current) use of aspirin: Secondary | ICD-10-CM

## 2020-04-08 DIAGNOSIS — I110 Hypertensive heart disease with heart failure: Secondary | ICD-10-CM | POA: Diagnosis present

## 2020-04-08 DIAGNOSIS — F17210 Nicotine dependence, cigarettes, uncomplicated: Secondary | ICD-10-CM | POA: Diagnosis present

## 2020-04-08 DIAGNOSIS — Z853 Personal history of malignant neoplasm of breast: Secondary | ICD-10-CM

## 2020-04-08 DIAGNOSIS — I6523 Occlusion and stenosis of bilateral carotid arteries: Secondary | ICD-10-CM | POA: Diagnosis not present

## 2020-04-08 DIAGNOSIS — Z801 Family history of malignant neoplasm of trachea, bronchus and lung: Secondary | ICD-10-CM

## 2020-04-08 DIAGNOSIS — S3992XA Unspecified injury of lower back, initial encounter: Secondary | ICD-10-CM | POA: Diagnosis not present

## 2020-04-08 DIAGNOSIS — I509 Heart failure, unspecified: Secondary | ICD-10-CM | POA: Diagnosis present

## 2020-04-08 DIAGNOSIS — J320 Chronic maxillary sinusitis: Secondary | ICD-10-CM | POA: Diagnosis not present

## 2020-04-08 DIAGNOSIS — J9611 Chronic respiratory failure with hypoxia: Secondary | ICD-10-CM | POA: Diagnosis not present

## 2020-04-08 DIAGNOSIS — S8992XA Unspecified injury of left lower leg, initial encounter: Secondary | ICD-10-CM | POA: Diagnosis not present

## 2020-04-08 DIAGNOSIS — S72002A Fracture of unspecified part of neck of left femur, initial encounter for closed fracture: Secondary | ICD-10-CM | POA: Diagnosis not present

## 2020-04-08 DIAGNOSIS — M25552 Pain in left hip: Secondary | ICD-10-CM | POA: Diagnosis not present

## 2020-04-08 DIAGNOSIS — E785 Hyperlipidemia, unspecified: Secondary | ICD-10-CM | POA: Diagnosis present

## 2020-04-08 DIAGNOSIS — R599 Enlarged lymph nodes, unspecified: Secondary | ICD-10-CM | POA: Diagnosis present

## 2020-04-08 DIAGNOSIS — M5136 Other intervertebral disc degeneration, lumbar region: Secondary | ICD-10-CM | POA: Diagnosis not present

## 2020-04-08 LAB — BASIC METABOLIC PANEL
Anion gap: 11 (ref 5–15)
BUN: 13 mg/dL (ref 8–23)
CO2: 26 mmol/L (ref 22–32)
Calcium: 9.2 mg/dL (ref 8.9–10.3)
Chloride: 100 mmol/L (ref 98–111)
Creatinine, Ser: 0.94 mg/dL (ref 0.44–1.00)
GFR calc Af Amer: >60 mL/min (ref >60)
GFR calc non Af Amer: >60 mL/min (ref >60)
Glucose, Bld: 98 mg/dL (ref 70–99)
Potassium: 3.8 mmol/L (ref 3.5–5.1)
Sodium: 137 mmol/L (ref 135–145)

## 2020-04-08 LAB — CBC WITH DIFFERENTIAL/PLATELET
Abs Immature Granulocytes: 0.05 10*3/uL (ref 0.00–0.07)
Basophils Absolute: 0.1 10*3/uL (ref 0.0–0.1)
Basophils Relative: 1 %
Eosinophils Absolute: 0.1 10*3/uL (ref 0.0–0.5)
Eosinophils Relative: 1 %
HCT: 29.6 % — ABNORMAL LOW (ref 36.0–46.0)
Hemoglobin: 9.4 g/dL — ABNORMAL LOW (ref 12.0–15.0)
Immature Granulocytes: 0 %
Lymphocytes Relative: 9 %
Lymphs Abs: 1.1 10*3/uL (ref 0.7–4.0)
MCH: 28.1 pg (ref 26.0–34.0)
MCHC: 31.8 g/dL (ref 30.0–36.0)
MCV: 88.6 fL (ref 80.0–100.0)
Monocytes Absolute: 0.7 10*3/uL (ref 0.1–1.0)
Monocytes Relative: 6 %
Neutro Abs: 10 10*3/uL — ABNORMAL HIGH (ref 1.7–7.7)
Neutrophils Relative %: 83 %
Platelets: 211 10*3/uL (ref 150–400)
RBC: 3.34 MIL/uL — ABNORMAL LOW (ref 3.87–5.11)
RDW: 15.9 % — ABNORMAL HIGH (ref 11.5–15.5)
WBC: 11.9 10*3/uL — ABNORMAL HIGH (ref 4.0–10.5)
nRBC: 0 % (ref 0.0–0.2)

## 2020-04-08 LAB — SARS CORONAVIRUS 2 BY RT PCR (HOSPITAL ORDER, PERFORMED IN ~~LOC~~ HOSPITAL LAB): SARS Coronavirus 2: NEGATIVE

## 2020-04-08 MED ORDER — ACETAMINOPHEN 650 MG RE SUPP: 650.0000 mg | Freq: Four times a day (QID) | RECTAL | Status: DC | PRN

## 2020-04-08 MED ORDER — MORPHINE SULFATE (PF) 4 MG/ML IV SOLN: 4.0000 mg | Freq: Once | INTRAVENOUS | Status: DC

## 2020-04-08 MED ORDER — UMECLIDINIUM BROMIDE 62.5 MCG/INH IN AEPB
1.0000 | INHALATION_SPRAY | Freq: Every day | RESPIRATORY_TRACT | Status: DC
  Administered 2020-04-09: 1 via RESPIRATORY_TRACT
  Filled 2020-04-08: qty 7

## 2020-04-08 MED ORDER — POLYETHYLENE GLYCOL 3350 17 G PO PACK
17.0000 g | PACK | Freq: Every day | ORAL | Status: DC | PRN
  Administered 2020-04-10: 17 g via ORAL

## 2020-04-08 MED ORDER — ENOXAPARIN SODIUM 40 MG/0.4ML ~~LOC~~ SOLN: 40.0000 mg | SUBCUTANEOUS | Status: DC

## 2020-04-08 MED ORDER — TAMOXIFEN CITRATE 10 MG PO TABS
20.0000 mg | ORAL_TABLET | Freq: Every day | ORAL | Status: DC
  Administered 2020-04-09 – 2020-04-12 (×4): 20 mg via ORAL
  Filled 2020-04-08 (×4): qty 2

## 2020-04-08 MED ORDER — ARFORMOTEROL TARTRATE 15 MCG/2ML IN NEBU
15.0000 ug | INHALATION_SOLUTION | Freq: Two times a day (BID) | RESPIRATORY_TRACT | Status: DC
  Administered 2020-04-09 – 2020-04-12 (×5): 15 ug via RESPIRATORY_TRACT
  Filled 2020-04-08 (×10): qty 2

## 2020-04-08 MED ORDER — ACETAMINOPHEN 325 MG PO TABS
650.0000 mg | ORAL_TABLET | Freq: Four times a day (QID) | ORAL | Status: DC | PRN
  Administered 2020-04-09 – 2020-04-10 (×2): 650 mg via ORAL
  Filled 2020-04-08 (×2): qty 2

## 2020-04-08 MED ORDER — PANTOPRAZOLE SODIUM 40 MG PO TBEC
40.0000 mg | DELAYED_RELEASE_TABLET | Freq: Every day | ORAL | Status: DC
  Administered 2020-04-09 – 2020-04-12 (×4): 40 mg via ORAL
  Filled 2020-04-08 (×4): qty 1

## 2020-04-08 MED ORDER — ENOXAPARIN SODIUM 300 MG/3ML IJ SOLN
20.0000 mg | INTRAMUSCULAR | Status: DC
  Administered 2020-04-08 – 2020-04-11 (×4): 20 mg via SUBCUTANEOUS
  Filled 2020-04-08 (×5): qty 0.2

## 2020-04-08 MED ORDER — ALBUTEROL SULFATE HFA 108 (90 BASE) MCG/ACT IN AERS: 2.0000 | INHALATION_SPRAY | Freq: Four times a day (QID) | RESPIRATORY_TRACT | Status: DC | PRN

## 2020-04-08 MED ORDER — METOPROLOL TARTRATE 25 MG PO TABS
25.0000 mg | ORAL_TABLET | Freq: Every day | ORAL | Status: DC
  Administered 2020-04-09 – 2020-04-12 (×4): 25 mg via ORAL
  Filled 2020-04-08 (×4): qty 1

## 2020-04-08 MED ORDER — IOHEXOL 300 MG/ML  SOLN
100.0000 mL | Freq: Once | INTRAMUSCULAR | Status: AC | PRN
  Administered 2020-04-08: 100 mL via INTRAVENOUS

## 2020-04-08 MED ORDER — ADULT MULTIVITAMIN W/MINERALS CH
1.0000 | ORAL_TABLET | Freq: Every day | ORAL | Status: DC
  Administered 2020-04-09 – 2020-04-12 (×4): 1 via ORAL
  Filled 2020-04-08 (×4): qty 1

## 2020-04-08 MED ORDER — DOXAZOSIN MESYLATE 8 MG PO TABS
8.0000 mg | ORAL_TABLET | Freq: Every day | ORAL | Status: DC
  Administered 2020-04-09 – 2020-04-12 (×4): 8 mg via ORAL
  Filled 2020-04-08 (×4): qty 1

## 2020-04-08 MED ORDER — ONDANSETRON HCL 4 MG PO TABS: 4.0000 mg | ORAL_TABLET | Freq: Four times a day (QID) | ORAL | Status: DC | PRN

## 2020-04-08 MED ORDER — ONDANSETRON HCL 4 MG/2ML IJ SOLN: 4.0000 mg | Freq: Four times a day (QID) | INTRAMUSCULAR | Status: DC | PRN

## 2020-04-08 MED ORDER — LEVOTHYROXINE SODIUM 50 MCG PO TABS
50.0000 ug | ORAL_TABLET | Freq: Every day | ORAL | Status: DC
  Administered 2020-04-09 – 2020-04-12 (×4): 50 ug via ORAL
  Filled 2020-04-08 (×4): qty 1

## 2020-04-08 MED ORDER — ASPIRIN EC 81 MG PO TBEC
81.0000 mg | DELAYED_RELEASE_TABLET | Freq: Every day | ORAL | Status: DC
  Administered 2020-04-09 – 2020-04-12 (×4): 81 mg via ORAL
  Filled 2020-04-08 (×4): qty 1

## 2020-04-08 MED ORDER — NICOTINE 14 MG/24HR TD PT24
14.0000 mg | MEDICATED_PATCH | Freq: Every day | TRANSDERMAL | Status: DC
  Administered 2020-04-09 – 2020-04-12 (×4): 14 mg via TRANSDERMAL
  Filled 2020-04-08 (×4): qty 1

## 2020-04-08 MED ORDER — HYDROCODONE-ACETAMINOPHEN 5-325 MG PO TABS: 1.0000 | ORAL_TABLET | ORAL | Status: DC | PRN

## 2020-04-08 MED ORDER — IBUPROFEN 200 MG PO TABS: 400.0000 mg | ORAL_TABLET | Freq: Four times a day (QID) | ORAL | Status: DC | PRN

## 2020-04-08 MED ORDER — ROSUVASTATIN CALCIUM 5 MG PO TABS
10.0000 mg | ORAL_TABLET | Freq: Every day | ORAL | Status: DC
  Administered 2020-04-10 – 2020-04-12 (×3): 10 mg via ORAL
  Filled 2020-04-08 (×3): qty 2

## 2020-04-08 NOTE — H&P (Signed)
History and Physical    Kendra Watts YJE:[id number] DOB: [birth year] DOA: 04/08/2020  PCP: Merrilee Seashore, MD  Patient coming from: Home  I have personally briefly reviewed patient's old medical records in [place]  Chief Complaint: Mechanical fall, cant walk  HPI: Kendra Watts is a 74 y.o. 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 Sondra Come, COPD on chronic home 4L O2.  Admitted pt last month for acute blood loss anemia due to recurrent nose bleeds (from the O2 via [place]), required transfusion.  No further nose bleeds since that admission thankfully (and HGB improved / stable today at 9.4).  Went to SNF after admit, discharged from SNF home around the 16th it looks and sounds like.  Pt walks with walker at baseline, lives at home alone.  Had mechanical fall this morning, walker slid out from under her.  Fell on L hip and side.  Thankfully family member present at home and assisted with standing; however, pt has pain with wt bearing.  EMS called and pt to ED.  ED Course: In ED imaging studies revealed: 1) greater trochanteric fx of L femur 2) acute 3rd L rib fx 3) new 0.36mm pulm nodule 4) two enlarged (~2cm) lymph nodes in pelvis 5) bunch of other stable chronic findings, pulm nodules, etc  Ortho called by EDP: WBAT on LLE for 6 weeks, not a surgical fx.  Hospitalist asked to admit for PT/OT/placement   Review of Systems: As per HPI, otherwise all review of systems negative.  Past Medical History:  Diagnosis Date  . Anemia   . 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 Breast cancer ([place]) 09/22/2015   Malignant  . Breast cancer of upper-outer quadrant of left female breast ([place]) 09/08/2015  . Cataract, immature   . CHF (congestive heart failure) ([place])    Acute CHF-06/2018  . COPD (chronic obstructive pulmonary disease)  ([place])   . Heart murmur    states no known problems  . History of hyperthyroidism   . Hyperlipidemia   . Hypertension    states under control with meds., has been on med. x "long time"  . Hypokalemia    from last physical.   . Hypothyroidism   . Nonfunctioning kidney    left  . Personal history of radiation therapy    2017  . Pulmonary nodules    Bilateral  . Radiation 10/30/15-11/28/15   left breast 42.72 Gy, boosted to 10 Gy  . Renal artery stenosis ([place])   . Tobacco abuse   . Wears partial dentures    upper and lower    Past Surgical History:  Procedure Laterality Date  . ABDOMINAL ANGIOGRAM  02/18/2012   Procedure: ABDOMINAL ANGIOGRAM;  Surgeon: Lorretta Harp, MD;  Location: [facility] CATH LAB;  Service: Cardiovascular;;  . ABDOMINAL AORTAGRAM  07/04/2014  . ABDOMINAL HYSTERECTOMY  ~ 1977   partial  . APPENDECTOMY    . ARCH AORTOGRAM    . BREAST LUMPECTOMY Left 09/22/2015   Malignant  . CAROTID ANGIOGRAM N/A 02/18/2012   Procedure: CAROTID ANGIOGRAM;  Surgeon: Lorretta Harp, MD;  Location: [facility] CATH LAB;  Service: Cardiovascular;  Laterality: N/A;  . ENDARTERECTOMY  04/02/2012   Procedure: ENDARTERECTOMY CAROTID;  Surgeon: Serafina Mitchell, MD;  Location: [facility] OR;  Service: Vascular;  Laterality: Right;  . FUDUCIAL  PLACEMENT Bilateral 09/02/2018   Procedure: Placement Of Fiducial to right upper lobe & left upper lobe lung;  Surgeon: [name], MD;  Location: [place];  Service: Thoracic;  Laterality: Bilateral;  . IR THORACENTESIS ASP PLEURAL SPACE W/IMG GUIDE  07/08/2018  . RADIOACTIVE SEED GUIDED PARTIAL MASTECTOMY WITH AXILLARY SENTINEL LYMPH NODE BIOPSY Left 09/22/2015   Procedure: INJECT BLUE DYE LEFT BREAST,RADIOACTIVE SEED GUIDED PARTIAL MASTECTOMY WITH AXILLARY SENTINEL LYMPH NODE BIOPSY;  Surgeon: Fanny Skates, MD;  Location: [place];  Service: General;  Laterality: Left;  . RENAL ANGIOGRAM Left 06/08/2010   renal artery stent -  5x12 Genesis on  Aviator balloon stent (Dr. Adora Fridge)  . RENAL ANGIOGRAM Right 07/04/2014   Procedure: RENAL ANGIOGRAM;  Surgeon: Lorretta Harp, MD;  Location: [facility] CATH LAB;  Service: Cardiovascular;  Laterality: Right;  . RENAL ANGIOGRAM Right 08/22/2014   Procedure: RENAL ANGIOGRAM;  Surgeon: Lorretta Harp, MD;  Location: [facility] CATH LAB;  Service: Cardiovascular;  Laterality: Right;  . TONSILLECTOMY     as a child  . VIDEO BRONCHOSCOPY WITH ENDOBRONCHIAL NAVIGATION N/A 09/02/2018   Procedure: VIDEO BRONCHOSCOPY WITH ENDOBRONCHIAL NAVIGATION;  Surgeon: [name], MD;  Location: [place];  Service: Thoracic;  Laterality: N/A;     reports that she has been smoking cigarettes. She has a 20.00 pack-year smoking history. She has never used smokeless tobacco. She reports current alcohol use. She reports that she does not use drugs.  No Known Allergies  Family History  Problem Relation Age of Onset  . Heart disease Mother        MI @ 29, died at 22  . Cancer Father   . Lung cancer Father   . Heart disease Maternal Grandmother   . Lung cancer Sister   . Breast cancer Paternal Aunt      Prior to Admission medications   Medication Sig Start Date End Date Taking? Authorizing Provider  albuterol (VENTOLIN HFA) 108 (90 Base) MCG/ACT inhaler Inhale 2 puffs into the lungs every 6 (six) hours as needed for wheezing or shortness of breath. 02/02/19   [name], MD  aspirin 81 MG tablet Take 1 tablet (81 mg total) by mouth daily. 03/21/20   Hongalgi, Lenis Dickinson, MD  Calcium-Magnesium-Vitamin D (CALCIUM 1200+D3 PO) Take 1 tablet by mouth daily.    [provider]  doxazosin (CARDURA) 8 MG tablet Take 8 mg by mouth daily.     [provider]  furosemide (LASIX) 20 MG tablet Take 1 tablet (20 mg total) by mouth daily. 07/13/18 03/13/20  Kayleen Memos, DO  ibandronate (BONIVA) 150 MG tablet Take 150 mg by mouth every 30 (thirty) days. 01/19/20   [provider]  levothyroxine (SYNTHROID) 50  MCG tablet Take 50 mcg by mouth daily. 12/08/19   [provider]  metoprolol tartrate (LOPRESSOR) 25 MG tablet Take 25 mg by mouth daily. 02/19/20   [provider]  Multiple Vitamins-Minerals (MULTIVITAMIN WITH MINERALS) tablet Take 1 tablet by mouth daily.     [provider]  nicotine (NICODERM CQ - DOSED IN MG/24 HOURS) 14 mg/24hr patch Place 1 patch (14 mg total) onto the skin daily. 03/19/20   Hongalgi, Lenis Dickinson, MD  pantoprazole (PROTONIX) 40 MG tablet Take 1 tablet (40 mg total) by mouth daily. 07/14/18   Kayleen Memos, DO  rosuvastatin (CRESTOR) 10 MG tablet Take 10 mg by mouth daily.     [provider]  tamoxifen (  NOLVADEX) 20 MG tablet Take 1 tablet (20 mg total) by mouth daily. 01/25/20   Nicholas Lose, MD  Tiotropium Bromide-Olodaterol (STIOLTO RESPIMAT) 2.5-2.5 MCG/ACT AERS Inhale 2 puffs into the lungs daily. 10/27/19   [name], MD    Physical Exam: Vitals:   04/08/20 1845 04/08/20 1900 04/08/20 1915 04/08/20 1930  BP: (!) 175/83  (!) 175/70 (!) 165/72  Pulse: 91 88 88 91  Resp: 17 14 14 17   Temp:      TempSrc:      SpO2: 99% 100% 100% 99%  Weight:      Height:        Constitutional: NAD, calm, comfortable Eyes: PERRL, lids and conjunctivae normal ENMT: Mucous membranes are moist. Posterior pharynx clear of any exudate or lesions.Normal dentition.  Neck: normal, supple, no masses, no thyromegaly Respiratory: clear to auscultation bilaterally, no wheezing, no crackles. Normal respiratory effort. No accessory muscle use.  Cardiovascular: Regular rate and rhythm, no murmurs / rubs / gallops. No extremity edema. 2+ pedal pulses. No carotid bruits.  Abdomen: no tenderness, no masses palpated. No hepatosplenomegaly. Bowel sounds positive.  Musculoskeletal: no clubbing / cyanosis. No joint deformity upper and lower extremities. Good ROM, no contractures. Normal muscle tone.  Skin: no rashes, lesions, ulcers. No induration Neurologic: CN  2-12 grossly intact. Sensation intact, DTR normal. Strength 5/5 in all 4.  Psychiatric: Normal judgment and insight. Alert and oriented x 3. Normal mood.    Labs on Admission: I have personally reviewed following labs and imaging studies  CBC: Recent Labs  Lab 04/08/20 1325  WBC 11.9*  NEUTROABS 10.0*  HGB 9.4*  HCT 29.6*  MCV 88.6  PLT 676   Basic Metabolic Panel: Recent Labs  Lab 04/08/20 1325  NA 137  K 3.8  CL 100  CO2 26  GLUCOSE 98  BUN 13  CREATININE 0.94  CALCIUM 9.2   GFR: Estimated Creatinine Clearance: 33.7 mL/min (by C-G formula based on SCr of 0.94 mg/dL). Liver Function Tests: No results for input(s): AST, ALT, ALKPHOS, BILITOT, PROT, ALBUMIN in the last 168 hours. No results for input(s): LIPASE, AMYLASE in the last 168 hours. No results for input(s): AMMONIA in the last 168 hours. Coagulation Profile: No results for input(s): INR, PROTIME in the last 168 hours. Cardiac Enzymes: No results for input(s): CKTOTAL, CKMB, CKMBINDEX, TROPONINI in the last 168 hours. BNP (last 3 results) No results for input(s): PROBNP in the last 8760 hours. HbA1C: No results for input(s): HGBA1C in the last 72 hours. CBG: No results for input(s): GLUCAP in the last 168 hours. Lipid Profile: No results for input(s): CHOL, HDL, LDLCALC, TRIG, CHOLHDL, LDLDIRECT in the last 72 hours. Thyroid Function Tests: No results for input(s): TSH, T4TOTAL, FREET4, T3FREE, THYROIDAB in the last 72 hours. Anemia Panel: No results for input(s): VITAMINB12, FOLATE, FERRITIN, TIBC, IRON, RETICCTPCT in the last 72 hours. Urine analysis:    Component Value Date/Time   COLORURINE STRAW (A) 03/13/2020 1128   APPEARANCEUR CLEAR 03/13/2020 1128   LABSPEC 1.006 03/13/2020 1128   PHURINE 6.0 03/13/2020 1128   GLUCOSEU NEGATIVE 03/13/2020 1128   HGBUR NEGATIVE 03/13/2020 1128   BILIRUBINUR NEGATIVE 03/13/2020 1128   KETONESUR NEGATIVE 03/13/2020 1128   PROTEINUR NEGATIVE 03/13/2020 1128    UROBILINOGEN 1.0 03/27/2012 1119   NITRITE NEGATIVE 03/13/2020 1128   LEUKOCYTESUR NEGATIVE 03/13/2020 1128    Radiological Exams on Admission: DG Chest 2 View  Result Date: 04/08/2020 CLINICAL DATA:  Fall EXAM: CHEST - 2  VIEW COMPARISON:  March 13, 2020 FINDINGS: The cardiomediastinal silhouette is unchanged in contour.Surgical clips project over the RIGHT neck and LEFT thorax. Vascular calcifications. Tortuous thoracic aorta. No pleural effusion. No pneumothorax. No acute pleuroparenchymal abnormality. Visualized abdomen is unremarkable. Mildly displaced LEFT lateral third and fourth rib fractures. IMPRESSION: Mildly displaced LEFT third and fourth rib fractures. No pneumothorax. Electronically Signed   By: Valentino Saxon MD   On: 04/08/2020 14:59   DG Lumbar Spine Complete  Result Date: 04/08/2020 CLINICAL DATA:  Fall EXAM: LUMBAR SPINE - COMPLETE 4+ VIEW COMPARISON:  None. FINDINGS: There are five non-rib bearing lumbar-type vertebral bodies. No spondylolisthesis. There is levocurvature of the mid lumbar spine. There is no evidence for acute fracture or subluxation. Intervertebral disc space height loss is moderate at L2-3 with multilevel endplate proliferative changes. Mild lower lumbar facet arthropathy. Vascular calcifications. Renal artery stents. IMPRESSION: 1. No acute fracture or subluxation of the lumbar spine. 2. Multilevel degenerative disc disease, most pronounced at L2-3. Electronically Signed   By: Valentino Saxon MD   On: 04/08/2020 15:02   DG Pelvis 1-2 Views  Result Date: 04/08/2020 CLINICAL DATA:  Fall EXAM: PELVIS - 1-2 VIEW; LEFT FEMUR 2 VIEWS COMPARISON:  None. FINDINGS: There is a comminuted of LEFT greater trochanteric fracture. There is minimal displacement of the tip of the greater trochanter. No definite extension through the femoral neck but evaluation is limited secondary to osteopenia. Vascular calcifications. No pelvic diastasis. Mild degenerative changes  of the lower lumbar spine. Limited assessment of the contralateral hip is unremarkable IMPRESSION: Comminuted of the LEFT greater trochanteric fracture with minimal displacement of the tip of the greater trochanter. No definite extension through the femoral neck but evaluation is limited secondary to osteopenia. Electronically Signed   By: Valentino Saxon MD   On: 04/08/2020 14:55   DG Tibia/Fibula Left  Result Date: 04/08/2020 CLINICAL DATA:  Fall EXAM: LEFT TIBIA AND FIBULA - 2 VIEW COMPARISON:  None. FINDINGS: Osteopenia. No acute fracture or dislocation. Joint spaces and alignment are maintained. No area of erosion or osseous destruction. No unexpected radiopaque foreign body. Vascular calcifications. IMPRESSION: 1. No acute osseous abnormality in the left tibia or fibula. Electronically Signed   By: Valentino Saxon MD   On: 04/08/2020 15:04   CT Head Wo Contrast  Result Date: 04/08/2020 CLINICAL DATA:  Fall with LEFT hip pain EXAM: CT HEAD WITHOUT CONTRAST CT CERVICAL SPINE WITHOUT CONTRAST TECHNIQUE: Multidetector CT imaging of the head and cervical spine was performed following the standard protocol without intravenous contrast. Multiplanar CT image reconstructions of the cervical spine were also generated. COMPARISON:  September 05, 2019. FINDINGS: CT HEAD FINDINGS Brain: No evidence of acute infarction, hemorrhage, hydrocephalus, extra-axial collection or mass lesion/mass effect. Periventricular white matter hypodensities consistent with sequela of chronic microvascular ischemic disease. Mildly advanced global volume loss for age. Vascular: Vascular calcifications of bilateral carotid siphons and vertebral arteries. Skull: No acute skull fracture.  Osteopenia. Sinuses/Orbits: Mucosal thickening of the RIGHT greater than LEFT maxillary sinus. Mucosal thickening of the RIGHT sphenoid sinus. Other: RIGHT ear cerumen. CT CERVICAL SPINE FINDINGS Alignment: Exaggerated lordosis, unchanged. No  spondylolisthesis or traumatic subluxation. Skull base and vertebrae: No acute fracture. No primary bone lesion or focal pathologic process. Incomplete fusion of the posterior arch of C1. Osteopenia. Soft tissues and spinal canal: No prevertebral fluid or swelling. No visible canal hematoma. Disc levels: Intervertebral disc space height loss at C4-5. Multilevel facet arthropathy. Upper chest: Revisualization of an irregular  RIGHT upper lobe pulmonary nodule measuring approximately 8 by 11 mm, similar in comparison to prior. Adjacent fiducial marker. Paraseptal emphysema. Other: Surgical clips in the RIGHT neck status post endarterectomy. Severe LEFT carotid atherosclerotic calcifications. IMPRESSION: 1.  No acute intracranial abnormality. 2.  No acute fracture or static subluxation of the cervical spine. Emphysema (ICD10-J43.9). Electronically Signed   By: Valentino Saxon MD   On: 04/08/2020 14:04   CT Chest W Contrast  Result Date: 04/08/2020 CLINICAL DATA:  Status post trauma. EXAM: CT CHEST, ABDOMEN, AND PELVIS WITH CONTRAST TECHNIQUE: Multidetector CT imaging of the chest, abdomen and pelvis was performed following the standard protocol during bolus administration of intravenous contrast. CONTRAST:  171mL OMNIPAQUE IOHEXOL 300 MG/ML  SOLN COMPARISON:  None. November 15, 2019 FINDINGS: CT CHEST FINDINGS Cardiovascular: There is marked severity calcification of the thoracic aorta and great vessels. Normal heart size with marked severity coronary artery calcification. No pericardial effusion. Mediastinum/Nodes: No enlarged mediastinal, hilar, or axillary lymph nodes. Thyroid gland, trachea, and esophagus demonstrate no significant findings. Lungs/Pleura: There is moderate severity emphysematous lung disease. A stable 1.0 cm x 0.7 cm noncalcified lung nodule is seen within the posteromedial aspect of the right apex. Small metallic density surgical material is also seen within this region. Stable moderate  severity scarring and/or atelectasis is seen within the posterior aspect of the left upper lobe. A stable 1.6 cm x 0.7 cm noncalcified nodular component is seen (axial CT image 37, CT series number 5). Small metallic density surgical material is also seen within this area. A stable 2 mm posteromedial left lower lobe noncalcified lung nodule is seen (axial CT image 91, CT series number 5). A 0.6 cm pleural based noncalcified lung nodule is seen within the posterior aspect of the left lower lobe (axial CT image 102, CT series number 5). This represents a new finding when compared to the prior exam. A stable 0.9 cm x 0.6 cm mildly lobulated noncalcified lung nodule is seen within the posterolateral aspect of the right lower lobe (axial CT image 103, CT series number 5). Stable mild pleural thickening is seen along the posterior aspect of the left lung base. This represents a new finding when compared to the prior study. Musculoskeletal: An acute lateral third left rib fracture is seen. A stable lateral fourth left rib fracture is noted. A chronic compression fracture deformity is seen involving the T3 vertebral body. Interval decrease in vertebral body height is seen since the prior study. CT ABDOMEN PELVIS FINDINGS Hepatobiliary: No focal liver abnormality is seen. No gallstones, gallbladder wall thickening, or biliary dilatation. Pancreas: Unremarkable. No pancreatic ductal dilatation or surrounding inflammatory changes. Spleen: Normal in size without focal abnormality. Adrenals/Urinary Tract: Adrenal glands are unremarkable. The left kidney is atrophic in appearance. Compensatory hypertrophy of the right kidney is seen. Subcentimeter cysts are noted within the upper pole of the left kidney. There is no evidence of renal calculi or hydronephrosis. The urinary bladder is moderately distended and otherwise normal in appearance. Stomach/Bowel: Stomach is within normal limits. The appendix is not clearly identified. No  evidence of bowel wall thickening, distention, or inflammatory changes. Vascular/Lymphatic: There is marked severity calcification and tortuosity of the abdominal aorta and bilateral common iliac arteries, without evidence of aneurysmal dilatation. A 1.9 cm x 1.5 cm lymph node is suspected along the medial aspect of the mid to lower right pelvis. A similar appearing 2.1 cm x 1.4 cm area is noted within this region on the left. Reproductive: Status  post hysterectomy. No adnexal masses. Other: No abdominal wall hernia or abnormality. No abdominopelvic ascites. Musculoskeletal: Acute fracture deformity is seen involving the greater trochanter of the proximal left femur. Marked severity degenerative changes seen at the level of L2-L3. IMPRESSION: 1. Acute lateral third left rib fracture with a stable lateral fourth left rib fracture. 2. Acute fracture of the greater trochanter of the proximal left femur. 3. Stable noncalcified lung nodules within the bilateral lungs, as described above with stable posterior left upper lobe scarring and/or atelectasis. 4. 6 mm pleural based noncalcified left lower lobe lung nodule which represents a new finding when compared to the prior exam. Three-month follow-up chest CT is recommended to determine stability. 5. Moderate severity emphysematous lung disease. 6. Mild pleural thickening along the posterior aspect of the left lung base. This represents a new finding when compared to the prior study. Correlation with follow-up chest CT is again recommended to determine stability and exclude the presence of an underlying neoplasm. 7. Chronic compression fracture deformity involving the T3 vertebral body with interval decrease in vertebral body height since the prior study. Aortic Atherosclerosis (ICD10-I70.0) and Emphysema (ICD10-J43.9). Electronically Signed   By: Virgina Norfolk M.D.   On: 04/08/2020 17:05   CT Cervical Spine Wo Contrast  Result Date: 04/08/2020 CLINICAL DATA:  Fall  with LEFT hip pain EXAM: CT HEAD WITHOUT CONTRAST CT CERVICAL SPINE WITHOUT CONTRAST TECHNIQUE: Multidetector CT imaging of the head and cervical spine was performed following the standard protocol without intravenous contrast. Multiplanar CT image reconstructions of the cervical spine were also generated. COMPARISON:  September 05, 2019. FINDINGS: CT HEAD FINDINGS Brain: No evidence of acute infarction, hemorrhage, hydrocephalus, extra-axial collection or mass lesion/mass effect. Periventricular white matter hypodensities consistent with sequela of chronic microvascular ischemic disease. Mildly advanced global volume loss for age. Vascular: Vascular calcifications of bilateral carotid siphons and vertebral arteries. Skull: No acute skull fracture.  Osteopenia. Sinuses/Orbits: Mucosal thickening of the RIGHT greater than LEFT maxillary sinus. Mucosal thickening of the RIGHT sphenoid sinus. Other: RIGHT ear cerumen. CT CERVICAL SPINE FINDINGS Alignment: Exaggerated lordosis, unchanged. No spondylolisthesis or traumatic subluxation. Skull base and vertebrae: No acute fracture. No primary bone lesion or focal pathologic process. Incomplete fusion of the posterior arch of C1. Osteopenia. Soft tissues and spinal canal: No prevertebral fluid or swelling. No visible canal hematoma. Disc levels: Intervertebral disc space height loss at C4-5. Multilevel facet arthropathy. Upper chest: Revisualization of an irregular RIGHT upper lobe pulmonary nodule measuring approximately 8 by 11 mm, similar in comparison to prior. Adjacent fiducial marker. Paraseptal emphysema. Other: Surgical clips in the RIGHT neck status post endarterectomy. Severe LEFT carotid atherosclerotic calcifications. IMPRESSION: 1.  No acute intracranial abnormality. 2.  No acute fracture or static subluxation of the cervical spine. Emphysema (ICD10-J43.9). Electronically Signed   By: Valentino Saxon MD   On: 04/08/2020 14:04   CT ABDOMEN PELVIS W  CONTRAST  Result Date: 04/08/2020 CLINICAL DATA:  Status post trauma. EXAM: CT CHEST, ABDOMEN, AND PELVIS WITH CONTRAST TECHNIQUE: Multidetector CT imaging of the chest, abdomen and pelvis was performed following the standard protocol during bolus administration of intravenous contrast. CONTRAST:  128mL OMNIPAQUE IOHEXOL 300 MG/ML  SOLN COMPARISON:  None. FINDINGS: CT CHEST FINDINGS Cardiovascular: There is marked severity calcification of the thoracic aorta and great vessels. Normal heart size with marked severity coronary artery calcification. No pericardial effusion. Mediastinum/Nodes: No enlarged mediastinal, hilar, or axillary lymph nodes. Thyroid gland, trachea, and esophagus demonstrate no significant findings. Lungs/Pleura: There  is moderate severity emphysematous lung disease. A stable 1.0 cm x 0.7 cm noncalcified lung nodule is seen within the posteromedial aspect of the right apex. Small metallic density surgical material is also seen within this region. Stable moderate severity scarring and/or atelectasis is seen within the posterior aspect of the left upper lobe. A stable 1.6 cm x 0.7 cm noncalcified nodular component is seen (axial CT image 37, CT series number 5). Small metallic density surgical material is also seen within this area. A stable 2 mm posteromedial left lower lobe noncalcified lung nodule is seen (axial CT image 91, CT series number 5). A 0.6 cm pleural based noncalcified lung nodule is seen within the posterior aspect of the left lower lobe (axial CT image 102, CT series number 5). This represents a new finding when compared to the prior exam. A stable 0.9 cm x 0.6 cm mildly lobulated noncalcified lung nodule is seen within the posterolateral aspect of the right lower lobe (axial CT image 103, CT series number 5). Stable mild pleural thickening is seen along the posterior aspect of the left lung base. This represents a new finding when compared to the prior study. Musculoskeletal: An  acute lateral third left rib fracture is seen. A stable lateral fourth left rib fracture is noted. A chronic compression fracture deformity is seen involving the T3 vertebral body. Interval decrease in vertebral body height is seen since the prior study. CT ABDOMEN PELVIS FINDINGS Hepatobiliary: No focal liver abnormality is seen. No gallstones, gallbladder wall thickening, or biliary dilatation. Pancreas: Unremarkable. No pancreatic ductal dilatation or surrounding inflammatory changes. Spleen: Normal in size without focal abnormality. Adrenals/Urinary Tract: Adrenal glands are unremarkable. The left kidney is atrophic in appearance. Compensatory hypertrophy of the right kidney is seen. Subcentimeter cysts are noted within the upper pole of the left kidney. There is no evidence of renal calculi or hydronephrosis. The urinary bladder is moderately distended and otherwise normal in appearance. Stomach/Bowel: Stomach is within normal limits. The appendix is not clearly identified. No evidence of bowel wall thickening, distention, or inflammatory changes. Vascular/Lymphatic: There is marked severity calcification and tortuosity of the abdominal aorta and bilateral common iliac arteries, without evidence of aneurysmal dilatation. A 1.9 cm x 1.5 cm lymph node is suspected along the medial aspect of the mid to lower right pelvis. A similar appearing 2.1 cm x 1.4 cm area is noted within this region on the left. Reproductive: Status post hysterectomy. No adnexal masses. Other: No abdominal wall hernia or abnormality. No abdominopelvic ascites. Musculoskeletal: Acute fracture deformity is seen involving the greater trochanter of the proximal left femur. Marked severity degenerative changes seen at the level of L2-L3. IMPRESSION: 1. Acute lateral third left rib fracture with a stable lateral fourth left rib fracture. 2. Acute fracture of the greater trochanter of the proximal left femur. 3. Stable noncalcified lung nodules  within the bilateral lungs, as described above with stable posterior left upper lobe scarring and/or atelectasis. 4. 6 mm pleural based noncalcified left lower lobe lung nodule which represents a new finding when compared to the prior exam. Three-month follow-up chest CT is recommended to determine stability. 5. Moderate severity emphysematous lung disease. 6. Mild pleural thickening along the posterior aspect of the left lung base. This represents a new finding when compared to the prior study. Correlation with follow-up chest CT is again recommended to determine stability and exclude the presence of an underlying neoplasm. 7. Chronic compression fracture deformity involving the T3 vertebral body with interval  decrease in vertebral body height since the prior study. Aortic Atherosclerosis (ICD10-I70.0) and Emphysema (ICD10-J43.9). Electronically Signed   By: Virgina Norfolk M.D.   On: 04/08/2020 17:05   DG Shoulder Left  Result Date: 04/08/2020 CLINICAL DATA:  Fall EXAM: LEFT SHOULDER - 2+ VIEW COMPARISON:  June 28, 2019 FINDINGS: Osteopenia. No acute fracture or dislocation of the shoulder. There is a mildly displaced fracture of the LEFT third and likely fourth rib. Surgical clips project over the RIGHT neck and LEFT thorax. No pneumothorax. No unexpected radiopaque foreign body. Vascular calcifications. IMPRESSION: 1. No acute fracture or dislocation of the LEFT shoulder. 2. Mildly displaced fracture of the LEFT third and likely fourth rib. Electronically Signed   By: Valentino Saxon MD   On: 04/08/2020 14:57   CT NO CHARGE  Result Date: 04/08/2020 CLINICAL DATA:  Status post trauma. EXAM: CT OF THE LEFT HIP WITHOUT CONTRAST TECHNIQUE: Multidetector CT imaging of the left hip was performed according to the standard protocol. Multiplanar CT image reconstructions were also generated. COMPARISON:  None. FINDINGS: Bones/Joint/Cartilage An acute comminuted fracture deformity is seen involving the  greater trochanter of the proximal left femur. Mild extension to involve the adjacent inter trochanteric portion of the proximal left femur is seen. There is no evidence of dislocation. Ligaments Suboptimally assessed by CT. Muscles and Tendons Muscles and tendons are unremarkable. Soft tissues Mild soft tissue swelling is seen surrounding the previously noted fracture site with a mild amount of subcutaneous inflammatory fat stranding seen along the lateral aspect of the left hip. IMPRESSION: Acute comminuted fracture deformity of the greater trochanter of the proximal left femur. Electronically Signed   By: Virgina Norfolk M.D.   On: 04/08/2020 17:14   DG FEMUR MIN 2 VIEWS LEFT  Result Date: 04/08/2020 CLINICAL DATA:  Fall EXAM: PELVIS - 1-2 VIEW; LEFT FEMUR 2 VIEWS COMPARISON:  None. FINDINGS: There is a comminuted of LEFT greater trochanteric fracture. There is minimal displacement of the tip of the greater trochanter. No definite extension through the femoral neck but evaluation is limited secondary to osteopenia. Vascular calcifications. No pelvic diastasis. Mild degenerative changes of the lower lumbar spine. Limited assessment of the contralateral hip is unremarkable IMPRESSION: Comminuted of the LEFT greater trochanteric fracture with minimal displacement of the tip of the greater trochanter. No definite extension through the femoral neck but evaluation is limited secondary to osteopenia. Electronically Signed   By: Valentino Saxon MD   On: 04/08/2020 14:55    EKG: Independently reviewed.  Assessment/Plan Principal Problem:   Fracture of greater trochanter of left femur (HCC) Active Problems:   Essential hypertension   Chronic respiratory failure with hypoxia (HCC)   Multiple lung nodules   Chronic obstructive pulmonary disease (HCC)   Squamous cell lung cancer, left (HCC)   Adenocarcinoma, lung, right (HCC)   Fracture of one rib, left side, initial encounter for closed fracture    Closed fracture of greater trochanter of left femur ([place])    1. Mechanical fall - Fx of greater troch of L femur 1. PT/OT eval and treat 2. SW eval and probably SNF vs CIR placement 3. WBAT as per ortho recs, non surgical 4. Pain control: tylenol, ibuprofen, and norco PRN 2. L 3rd rib fx - 1. Incentive spirometry 2. Pain ctrl 3. HTN -  1. Cont home BP meds 4. COPD - 1. Cont home nebs 2. Cont O2 via [place] 5. Multiple lung nodules, h/o multiple NSCLCs - 1. With mostly stable findings  2. Though has new 0.11mm pulm nodule and 2x ~2cm lymph nodes in pelvis 3. Sent message on Epic to Dr. Sondra Come for review  DVT prophylaxis: Lovenox Code Status: Full Family Communication: No family in room Disposition Plan: SNF vs CIR probably, SW, PT and OT consulted Consults called: None, sent epic message to Dr. Sondra Come Admission status: place in [place]   Jennifermarie Franzen, [facility]  How to contact the [facility] Attending or Consulting provider [place] or covering provider during after hours [place], for this patient?  1. Check the care team in [facility] and look for a) attending/consulting [facility] provider listed and b) the [facility] team listed 2. Log into [email or web address]  Amion Physician Scheduling and messaging for groups and whole hospitals  On call and physician scheduling software for group practices, residents, hospitalists and other medical providers for call, clinic, rotation and shift schedules. On[employer] is a hospital-wide system for scheduling doctors and paging doctors on call. EasyPlot is for scientific plotting and data analysis.  [email or web address]  and use [place]'s universal password to access. If you do not have the password, please contact the hospital operator.  3. Locate the [facility] provider you are looking for under [facility] and page to a number that you can be directly reached. 4. If you still have difficulty reaching the provider, please page the [facility] (Director on Call) for the  Hospitalists listed on amion for assistance.  04/08/2020, 8:26 PM

## 2020-04-08 NOTE — ED Provider Notes (Signed)
Care of the patient was assumed from B. Henderley PA-C at 330; see this provider's note for complete history of present illness, review of systems, and physical exam.  Briefly, the patient is a 74 y.o. female who presented to the ED with mechanical fall.  Was recently discharged from SNF due to recurrent epistaxis.  Not anticoagulated.  Denies pain.  Walker slid out from patient today, awaiting remaining CTs.  No intracranial abnormality on CT, normal cervical spine CT.  Plan at time of handoff:  Awaiting CTs of abdomen pelvis, chest and hip.  Admit for trochanter fracture and multiple rib fractures.     Physical Exam  BP (!) 197/86   Pulse 90   Temp 97.8 F (36.6 C) (Oral)   Resp 14   Ht 5' (1.524 m)   Wt 40 kg   SpO2 100%   BMI 17.22 kg/m     ED Course/Procedures   Clinical Course as of Apr 09 1519  Sat Apr 08, 2020  1409 CT head without acute process  CT Head Wo Contrast [BH]  1409 CT cervical without acute process  CT Cervical Spine Wo Contrast [BH]  1505 Noted greater trochanter fracture however cannot tell if there is extension due to severe osteoporosis  DG FEMUR MIN 2 VIEWS LEFT [BH]  1505 Greater trochanter fracture, no pelvic fracture  DG Pelvis 1-2 Views [BH]  1505 No shoulder fracture dislocation however possible left third and fourth rib fracture  DG Shoulder Left [BH]  1505 Left third and fourth rib fracture, no pneumothorax  DG Chest 2 View [BH]  1506 No acute changes to lumbar spine  DG Lumbar Spine Complete [BH]  1507 No bony abnormality to tibia or fibula  DG Tibia/Fibula Left [BH]    Clinical Course User Index [BH] Henderly, Britni A, PA-C    Procedures  MDM  62 CT shows acute comminuted fracture of the greater trochanter proximal left femur and left third and fourth rib fractures.  Will call orthopedics at this time.  Patient sees [facility], Dr. Rolena Infante who was on for [facility] told me to call Dr. Griffin Basil since patient has not seen [facility]  for surgery in over a year.  650 spoke to Dr. Griffin Basil, orthopedics who states that patient does not need surgery at this time.  States that she can come into the hospital service for mobilization PT/OT.  Weightbearing as tolerated for 6 weeks.  Spoke to patient about option of going home, patient states that she would rather come into the hospital at this time.  740 spoke to Dr. Redmond Pulling, trauma who thinks that patient would be better managed with the hospital service due to chronic medical conditions..  Will consult hospitalist at this time.  He says that they will consult if needed for the rib fractures.  845 spoke to Dr. Alcario Drought, hospitalist  Who will accept the patient.   The patient appears reasonably stabilized for admission considering the current resources, flow, and capabilities available in the ED at this time, and I doubt any other [facility] requiring further screening and/or treatment in the ED prior to admission.   Alfredia Client, PA-C 04/08/20 2129    Sherwood Gambler, MD 04/08/20 [phone]

## 2020-04-08 NOTE — ED Notes (Signed)
Pt refusing morphine at this time. Pt states "I don't like to take that stuff. Makes me all loopy in the head." Morphine to be held at this time. Pt to CT.

## 2020-04-08 NOTE — ED Provider Notes (Signed)
[facility] EMERGENCY DEPARTMENT Provider Note   CSN: [id number] Arrival date & time: 04/08/20  1239    History Chief Complaint  Patient presents with  . Fall    Kendra Watts is a 74 y.o. female with past medical history significant for COPD on 4 L oxygen chronically, hypertension, hyperlipidemia, renal artery stenosis, anemia, lung cancer who presents for evaluation of fall.  Patient states she was just discharged from [facility] where she was for physical therapy after deconditioning after being hospitalized for recurrent epistaxis.  Patient states she was at home today, was in her bathroom went to reach for her wheelchair.  Patient states when she grabbed a hold of the wheelchair it rolled in front of her and she subsequently fell on the left side of her body.  She does not think she hit her head.  She is not on anticoagulation.  She denies any loss of consciousness.  Patient with pain to left anterior shoulder, left hip as well as proximal tib-fib.  She was lifted from the floor from her family however has pain with weightbearing to left leg.  She has not taken anything for her symptoms.  She denies headache, lightheadedness, dizziness, chest pain, shortness of breath, paresthesias, redness, swelling, warmth to extremities.  She denies additional aggravating or alleviating factors.  History obtained from patient and past medical records. No interpretor was used.   [facility]- Has seen Dr. Amedeo Plenty, Sharrell Ku  HPI    Past Medical History:  Diagnosis Date  . Anemia   . 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 Breast cancer ([place]) 09/22/2015   Malignant  . Breast cancer of upper-outer quadrant of left female breast ([place]) 09/08/2015  . Cataract, immature   . CHF (congestive heart failure) ([place])    Acute CHF-06/2018  . COPD (chronic obstructive pulmonary disease) ([place])   . Heart murmur    states no known problems  . History of  hyperthyroidism   . Hyperlipidemia   . Hypertension    states under control with meds., has been on med. x "long time"  . Hypokalemia    from last physical.   . Hypothyroidism   . Nonfunctioning kidney    left  . Personal history of radiation therapy    2017  . Pulmonary nodules    Bilateral  . Radiation 10/30/15-11/28/15   left breast 42.72 Gy, boosted to 10 Gy  . Renal artery stenosis ([place])   . Tobacco abuse   . Wears partial dentures    upper and lower    Patient Active Problem List   Diagnosis Date Noted  . Epistaxis 03/13/2020  . Acute blood loss anemia 03/13/2020  . Current smoker   . Hypertension, uncontrolled   . 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 Hemoptysis 09/14/2018  . Squamous cell lung cancer, left ([place]) 09/10/2018  . Adenocarcinoma, lung, right ([place]) 09/10/2018  . Multiple lung nodules 07/20/2018  . Chronic obstructive pulmonary disease ([place]) 07/20/2018  . Acute CHF (congestive heart failure) ([place]) 07/06/2018  . Chronic respiratory failure with hypoxia ([place]) 07/06/2018  . Hyponatremia 07/06/2018  . Anemia 07/06/2018  . Breast cancer of upper-outer quadrant of left female breast ([place]) 09/08/2015  . Renal artery stenosis ([place]) 08/23/2014  . Right upper extremity numbness 07/01/2013  . Atherosclerotic renal artery stenosis, bilateral ([place]) 05/26/2013  . Peripheral arterial disease ([place]) 05/26/2013  . Essential hypertension 05/26/2013  . Hyperlipidemia 05/26/2013  .  Tobacco abuse 05/26/2013  . Carotid artery stenosis 03/09/2012    Past Surgical History:  Procedure Laterality Date  . ABDOMINAL ANGIOGRAM  02/18/2012   Procedure: ABDOMINAL ANGIOGRAM;  Surgeon: Lorretta Harp, MD;  Location: [facility] CATH LAB;  Service: Cardiovascular;;  . ABDOMINAL AORTAGRAM  07/04/2014  . ABDOMINAL HYSTERECTOMY  ~ 1977   partial  . APPENDECTOMY    . ARCH AORTOGRAM    . BREAST LUMPECTOMY Left 09/22/2015   Malignant  . CAROTID ANGIOGRAM N/A 02/18/2012   Procedure:  CAROTID ANGIOGRAM;  Surgeon: Lorretta Harp, MD;  Location: [facility] CATH LAB;  Service: Cardiovascular;  Laterality: N/A;  . ENDARTERECTOMY  04/02/2012   Procedure: ENDARTERECTOMY CAROTID;  Surgeon: Serafina Mitchell, MD;  Location: [facility] OR;  Service: Vascular;  Laterality: Right;  . FUDUCIAL PLACEMENT Bilateral 09/02/2018   Procedure: Placement Of Fiducial to right upper lobe & left upper lobe lung;  Surgeon: [name], MD;  Location: [facility] OR;  Service: Thoracic;  Laterality: Bilateral;  . IR THORACENTESIS ASP PLEURAL SPACE W/IMG GUIDE  07/08/2018  . RADIOACTIVE SEED GUIDED PARTIAL MASTECTOMY WITH AXILLARY SENTINEL LYMPH NODE BIOPSY Left 09/22/2015   Procedure: INJECT BLUE DYE LEFT BREAST,RADIOACTIVE SEED GUIDED PARTIAL MASTECTOMY WITH AXILLARY SENTINEL LYMPH NODE BIOPSY;  Surgeon: Fanny Skates, MD;  Location: [facility];  Service: General;  Laterality: Left;  . RENAL ANGIOGRAM Left 06/08/2010   renal artery stent -  5x12 Genesis on Aviator balloon stent (Dr. Adora Fridge)  . RENAL ANGIOGRAM Right 07/04/2014   Procedure: RENAL ANGIOGRAM;  Surgeon: Lorretta Harp, MD;  Location: [facility] CATH LAB;  Service: Cardiovascular;  Laterality: Right;  . RENAL ANGIOGRAM Right 08/22/2014   Procedure: RENAL ANGIOGRAM;  Surgeon: Lorretta Harp, MD;  Location: [facility] CATH LAB;  Service: Cardiovascular;  Laterality: Right;  . TONSILLECTOMY     as a child  . VIDEO BRONCHOSCOPY WITH ENDOBRONCHIAL NAVIGATION N/A 09/02/2018   Procedure: VIDEO BRONCHOSCOPY WITH ENDOBRONCHIAL NAVIGATION;  Surgeon: [name], MD;  Location: [facility] OR;  Service: Thoracic;  Laterality: N/A;     OB History   No obstetric history on file.     Family History  Problem Relation Age of Onset  . Heart disease Mother        MI @ 18, died at 59  . Cancer Father   . Lung cancer Father   . Heart disease Maternal Grandmother   . Lung cancer Sister   . Breast cancer Paternal Aunt     Social History   Tobacco Use  . Smoking status:  Current Every Day Smoker    Packs/day: 0.50    Years: 40.00    Pack years: 20.00    Types: Cigarettes  . Smokeless tobacco: Never Used  . Tobacco comment: as of 09/14/2018, less than 1 pack/day  Vaping Use  . Vaping Use: Never used  Substance Use Topics  . Alcohol use: Yes    Comment: occasionally  . Drug use: No    Home Medications Prior to Admission medications   Medication Sig Start Date End Date Taking? Authorizing Provider  albuterol (VENTOLIN HFA) 108 (90 Base) MCG/ACT inhaler Inhale 2 puffs into the lungs every 6 (six) hours as needed for wheezing or shortness of breath. 02/02/19   [name], MD  aspirin 81 MG tablet Take 1 tablet (81 mg total) by mouth daily. 03/21/20   Hongalgi, Lenis Dickinson, MD  Calcium-Magnesium-Vitamin D (CALCIUM 1200+D3 PO) Take 1 tablet by mouth daily.  [provider]  doxazosin (CARDURA) 8 MG tablet Take 8 mg by mouth daily.     [provider]  furosemide (LASIX) 20 MG tablet Take 1 tablet (20 mg total) by mouth daily. 07/13/18 03/13/20  Kayleen Memos, DO  ibandronate (BONIVA) 150 MG tablet Take 150 mg by mouth every 30 (thirty) days. 01/19/20   [provider]  levothyroxine (SYNTHROID) 50 MCG tablet Take 50 mcg by mouth daily. 12/08/19   [provider]  metoprolol tartrate (LOPRESSOR) 25 MG tablet Take 25 mg by mouth daily. 02/19/20   [provider]  Multiple Vitamins-Minerals (MULTIVITAMIN WITH MINERALS) tablet Take 1 tablet by mouth daily.     [provider]  nicotine (NICODERM CQ - DOSED IN MG/24 HOURS) 14 mg/24hr patch Place 1 patch (14 mg total) onto the skin daily. 03/19/20   Hongalgi, Lenis Dickinson, MD  pantoprazole (PROTONIX) 40 MG tablet Take 1 tablet (40 mg total) by mouth daily. 07/14/18   Kayleen Memos, DO  rosuvastatin (CRESTOR) 10 MG tablet Take 10 mg by mouth daily.     [provider]  tamoxifen (NOLVADEX) 20 MG tablet Take 1 tablet (20 mg total) by mouth daily. 01/25/20   Nicholas Lose, MD  Tiotropium Bromide-Olodaterol (STIOLTO RESPIMAT) 2.5-2.5 MCG/ACT AERS Inhale 2 puffs into the lungs daily. 10/27/19   [name], MD    Allergies    Patient has no known allergies.  Review of Systems   Review of Systems  Constitutional: Negative.   HENT: Negative.   Respiratory: Negative.   Cardiovascular: Negative.   Gastrointestinal: Negative.   Genitourinary: Negative.   Musculoskeletal:       Left shoulder, hip, tib/fib pain  Skin: Negative.   Neurological: Negative.   All other systems reviewed and are negative.  Physical Exam Updated Vital Signs BP (!) 193/94   Pulse 98   Temp 97.8 F (36.6 C) (Oral)   Resp 14   Ht 5' (1.524 m)   Wt 40 kg   SpO2 99%   BMI 17.22 kg/m   Physical Exam Vitals and nursing note reviewed.  Constitutional:      General: She is not in acute distress.    Appearance: She is well-developed. She is not ill-appearing, toxic-appearing or diaphoretic.  HENT:     Head: Atraumatic.  Eyes:     Pupils: Pupils are equal, round, and reactive to light.  Cardiovascular:     Rate and Rhythm: Normal rate.     Pulses: Normal pulses.     Heart sounds: Normal heart sounds.  Pulmonary:     Effort: Pulmonary effort is normal. No respiratory distress.     Breath sounds: Normal breath sounds.     Comments: Speaks in full sentences without difficult. 4 L [place] at baseline. Abdominal:     General: Bowel sounds are normal. There is no distension.     Tenderness: There is no abdominal tenderness. There is no right CVA tenderness, left CVA tenderness, guarding or rebound.     Hernia: No hernia is present.  Musculoskeletal:        General: Normal range of motion.     Cervical back: Normal range of motion.     Comments: Pelvis stable, mild tenderness to left pelvis and left hip.  She is able to flex and extend at bilateral knees however with pain to left lower extremity.  She has no shortening or rotation of legs.  Able to plantarflex and dorsiflex  without  difficulty.  Tenderness to left anterior shoulder.  She is able to overhead raise without difficulty, negative [place], empty can.  Mild tenderness to lumbar however no thoracic or cervical tenderness.  No crepitus or step-offs.  Skin:    General: Skin is warm and dry.     Capillary Refill: Capillary refill takes less than 2 seconds.  Neurological:     General: No focal deficit present.     Mental Status: She is alert and oriented to person, place, and time.     Comments: Alert and oriented to person, place and time Cranial nerves II through XII without difficulty Intact sensation Unable to ambulate due to pain    ED Results / Procedures / Treatments   Labs (all labs ordered are listed, but only abnormal results are displayed) Labs Reviewed  CBC WITH DIFFERENTIAL/PLATELET - Abnormal; Notable for the following components:      Result Value   WBC 11.9 (*)    RBC 3.34 (*)    Hemoglobin 9.4 (*)    HCT 29.6 (*)    RDW 15.9 (*)    Neutro Abs 10.0 (*)    All other components within normal limits  SARS CORONAVIRUS 2 BY RT PCR (HOSPITAL ORDER, [facility])  BASIC METABOLIC PANEL    EKG None  Radiology DG Chest 2 View  Result Date: 04/08/2020 CLINICAL DATA:  Fall EXAM: CHEST - 2 VIEW COMPARISON:  March 13, 2020 FINDINGS: The cardiomediastinal silhouette is unchanged in contour.Surgical clips project over the RIGHT neck and LEFT thorax. Vascular calcifications. Tortuous thoracic aorta. No pleural effusion. No pneumothorax. No acute pleuroparenchymal abnormality. Visualized abdomen is unremarkable. Mildly displaced LEFT lateral third and fourth rib fractures. IMPRESSION: Mildly displaced LEFT third and fourth rib fractures. No pneumothorax. Electronically Signed   By: Valentino Saxon MD   On: 04/08/2020 14:59   DG Lumbar Spine Complete  Result Date: 04/08/2020 CLINICAL DATA:  Fall EXAM: LUMBAR SPINE - COMPLETE 4+ VIEW COMPARISON:  None. FINDINGS:  There are five non-rib bearing lumbar-type vertebral bodies. No spondylolisthesis. There is levocurvature of the mid lumbar spine. There is no evidence for acute fracture or subluxation. Intervertebral disc space height loss is moderate at L2-3 with multilevel endplate proliferative changes. Mild lower lumbar facet arthropathy. Vascular calcifications. Renal artery stents. IMPRESSION: 1. No acute fracture or subluxation of the lumbar spine. 2. Multilevel degenerative disc disease, most pronounced at L2-3. Electronically Signed   By: Valentino Saxon MD   On: 04/08/2020 15:02   DG Pelvis 1-2 Views  Result Date: 04/08/2020 CLINICAL DATA:  Fall EXAM: PELVIS - 1-2 VIEW; LEFT FEMUR 2 VIEWS COMPARISON:  None. FINDINGS: There is a comminuted of LEFT greater trochanteric fracture. There is minimal displacement of the tip of the greater trochanter. No definite extension through the femoral neck but evaluation is limited secondary to osteopenia. Vascular calcifications. No pelvic diastasis. Mild degenerative changes of the lower lumbar spine. Limited assessment of the contralateral hip is unremarkable IMPRESSION: Comminuted of the LEFT greater trochanteric fracture with minimal displacement of the tip of the greater trochanter. No definite extension through the femoral neck but evaluation is limited secondary to osteopenia. Electronically Signed   By: Valentino Saxon MD   On: 04/08/2020 14:55   DG Tibia/Fibula Left  Result Date: 04/08/2020 CLINICAL DATA:  Fall EXAM: LEFT TIBIA AND FIBULA - 2 VIEW COMPARISON:  None. FINDINGS: Osteopenia. No acute fracture or dislocation. Joint spaces and alignment are maintained. No area of erosion  or osseous destruction. No unexpected radiopaque foreign body. Vascular calcifications. IMPRESSION: 1. No acute osseous abnormality in the left tibia or fibula. Electronically Signed   By: Valentino Saxon MD   On: 04/08/2020 15:04   CT Head Wo Contrast  Result Date:  04/08/2020 CLINICAL DATA:  Fall with LEFT hip pain EXAM: CT HEAD WITHOUT CONTRAST CT CERVICAL SPINE WITHOUT CONTRAST TECHNIQUE: Multidetector CT imaging of the head and cervical spine was performed following the standard protocol without intravenous contrast. Multiplanar CT image reconstructions of the cervical spine were also generated. COMPARISON:  September 05, 2019. FINDINGS: CT HEAD FINDINGS Brain: No evidence of acute infarction, hemorrhage, hydrocephalus, extra-axial collection or mass lesion/mass effect. Periventricular white matter hypodensities consistent with sequela of chronic microvascular ischemic disease. Mildly advanced global volume loss for age. Vascular: Vascular calcifications of bilateral carotid siphons and vertebral arteries. Skull: No acute skull fracture.  Osteopenia. Sinuses/Orbits: Mucosal thickening of the RIGHT greater than LEFT maxillary sinus. Mucosal thickening of the RIGHT sphenoid sinus. Other: RIGHT ear cerumen. CT CERVICAL SPINE FINDINGS Alignment: Exaggerated lordosis, unchanged. No spondylolisthesis or traumatic subluxation. Skull base and vertebrae: No acute fracture. No primary bone lesion or focal pathologic process. Incomplete fusion of the posterior arch of C1. Osteopenia. Soft tissues and spinal canal: No prevertebral fluid or swelling. No visible canal hematoma. Disc levels: Intervertebral disc space height loss at C4-5. Multilevel facet arthropathy. Upper chest: Revisualization of an irregular RIGHT upper lobe pulmonary nodule measuring approximately 8 by 11 mm, similar in comparison to prior. Adjacent fiducial marker. Paraseptal emphysema. Other: Surgical clips in the RIGHT neck status post endarterectomy. Severe LEFT carotid atherosclerotic calcifications. IMPRESSION: 1.  No acute intracranial abnormality. 2.  No acute fracture or static subluxation of the cervical spine. Emphysema (ICD10-J43.9). Electronically Signed   By: Valentino Saxon MD   On: 04/08/2020 14:04    CT Cervical Spine Wo Contrast  Result Date: 04/08/2020 CLINICAL DATA:  Fall with LEFT hip pain EXAM: CT HEAD WITHOUT CONTRAST CT CERVICAL SPINE WITHOUT CONTRAST TECHNIQUE: Multidetector CT imaging of the head and cervical spine was performed following the standard protocol without intravenous contrast. Multiplanar CT image reconstructions of the cervical spine were also generated. COMPARISON:  September 05, 2019. FINDINGS: CT HEAD FINDINGS Brain: No evidence of acute infarction, hemorrhage, hydrocephalus, extra-axial collection or mass lesion/mass effect. Periventricular white matter hypodensities consistent with sequela of chronic microvascular ischemic disease. Mildly advanced global volume loss for age. Vascular: Vascular calcifications of bilateral carotid siphons and vertebral arteries. Skull: No acute skull fracture.  Osteopenia. Sinuses/Orbits: Mucosal thickening of the RIGHT greater than LEFT maxillary sinus. Mucosal thickening of the RIGHT sphenoid sinus. Other: RIGHT ear cerumen. CT CERVICAL SPINE FINDINGS Alignment: Exaggerated lordosis, unchanged. No spondylolisthesis or traumatic subluxation. Skull base and vertebrae: No acute fracture. No primary bone lesion or focal pathologic process. Incomplete fusion of the posterior arch of C1. Osteopenia. Soft tissues and spinal canal: No prevertebral fluid or swelling. No visible canal hematoma. Disc levels: Intervertebral disc space height loss at C4-5. Multilevel facet arthropathy. Upper chest: Revisualization of an irregular RIGHT upper lobe pulmonary nodule measuring approximately 8 by 11 mm, similar in comparison to prior. Adjacent fiducial marker. Paraseptal emphysema. Other: Surgical clips in the RIGHT neck status post endarterectomy. Severe LEFT carotid atherosclerotic calcifications. IMPRESSION: 1.  No acute intracranial abnormality. 2.  No acute fracture or static subluxation of the cervical spine. Emphysema (ICD10-J43.9). Electronically Signed    By: Valentino Saxon MD   On: 04/08/2020 14:04  DG Shoulder Left  Result Date: 04/08/2020 CLINICAL DATA:  Fall EXAM: LEFT SHOULDER - 2+ VIEW COMPARISON:  June 28, 2019 FINDINGS: Osteopenia. No acute fracture or dislocation of the shoulder. There is a mildly displaced fracture of the LEFT third and likely fourth rib. Surgical clips project over the RIGHT neck and LEFT thorax. No pneumothorax. No unexpected radiopaque foreign body. Vascular calcifications. IMPRESSION: 1. No acute fracture or dislocation of the LEFT shoulder. 2. Mildly displaced fracture of the LEFT third and likely fourth rib. Electronically Signed   By: Valentino Saxon MD   On: 04/08/2020 14:57   DG FEMUR MIN 2 VIEWS LEFT  Result Date: 04/08/2020 CLINICAL DATA:  Fall EXAM: PELVIS - 1-2 VIEW; LEFT FEMUR 2 VIEWS COMPARISON:  None. FINDINGS: There is a comminuted of LEFT greater trochanteric fracture. There is minimal displacement of the tip of the greater trochanter. No definite extension through the femoral neck but evaluation is limited secondary to osteopenia. Vascular calcifications. No pelvic diastasis. Mild degenerative changes of the lower lumbar spine. Limited assessment of the contralateral hip is unremarkable IMPRESSION: Comminuted of the LEFT greater trochanteric fracture with minimal displacement of the tip of the greater trochanter. No definite extension through the femoral neck but evaluation is limited secondary to osteopenia. Electronically Signed   By: Valentino Saxon MD   On: 04/08/2020 14:55    Procedures Procedures (including critical care time)  Medications Ordered in ED Medications  morphine 4 MG/ML injection 4 mg (4 mg Intravenous Refused 04/08/20 1359)    ED Course  I have reviewed the triage vital signs and the nursing notes.  Pertinent labs & imaging results that were available during my care of the patient were reviewed by me and considered in my medical decision making (see chart for  details).  74 year old recently discharged from SNF to home where she lives herself presents for evaluation mechanical fall.  Unwitnessed by anyone.  She went to reach for her wheelchair when it rolled out in front of her.  She subsequently fell on her left side.  She denies headache, lightheadedness, dizziness, preceding symptoms prior to fall.  She is unsure if she hit her head.  She denies any anticoagulation or loss of consciousness.  Generalized pain to left lower extremity and left anterior shoulder.  She has been able to ambulate due to pain with weightbearing.  We will plan on labs, imaging and reassess  Clinical Course as of Apr 08 1536  Sat Apr 08, 2020  1409 CT head without acute process  CT Head Wo Contrast [BH]  1409 CT cervical without acute process  CT Cervical Spine Wo Contrast [BH]  1505 Noted greater trochanter fracture however cannot tell if there is extension due to severe osteoporosis  DG FEMUR MIN 2 VIEWS LEFT [BH]  1505 Greater trochanter fracture, no pelvic fracture  DG Pelvis 1-2 Views [BH]  1505 No shoulder fracture dislocation however possible left third and fourth rib fracture  DG Shoulder Left [BH]  1505 Left third and fourth rib fracture, no pneumothorax  DG Chest 2 View [BH]  1506 No acute changes to lumbar spine  DG Lumbar Spine Complete [BH]  1507 No bony abnormality to tibia or fibula  DG Tibia/Fibula Left [BH]    Clinical Course User Index [BH] Yamil Dougher A, PA-C   Patient reassessed.  Has declined pain management.  Patient with greater trochanter fracture as well as multiple rib fractures. Will obtain further imaging with CT chest, abdomen pelvis as well  as left hip. Anticipate likely admission.  Care transferred to [facility] [place] who will follow up on remaining imaging and determine ultimate disposition.  Patient discussed with attending, Dr. Sedonia Small who agrees with above plan  MDM Rules/Calculators/A&P                           Final Clinical  Impression(s) / ED Diagnoses Final diagnoses:  Fall, initial encounter  Closed displaced fracture of greater trochanter of left femur, initial encounter [facility])  Closed fracture of multiple ribs of left side, initial encounter    Rx / DC Orders ED Discharge Orders    None       Sarissa Dern A, PA-C 04/08/20 1537    Maudie Flakes, MD 04/08/20 1645

## 2020-04-08 NOTE — ED Notes (Signed)
Help get patient into a gown on the monitor patient is resting with call bell in reach

## 2020-04-08 NOTE — ED Triage Notes (Signed)
Pt brought to ED from home via EMS with c/o fall this AM. Pt reports walker slid out from under her causing her to fall on left hip. Pt assisted to standing by family member, however, reports pain with weight bearing. No obvious deformities, no blood thinner use.  Alert and oriented x4  upon arrival to ED, VSS, NAD. 4L [place] chronically.  EMS v/s: 128/64 125 CBG 92 HR 100% 4L [place]

## 2020-04-09 DIAGNOSIS — W19XXXA Unspecified fall, initial encounter: Secondary | ICD-10-CM | POA: Diagnosis not present

## 2020-04-09 DIAGNOSIS — Z79899 Other long term (current) drug therapy: Secondary | ICD-10-CM | POA: Diagnosis not present

## 2020-04-09 DIAGNOSIS — R599 Enlarged lymph nodes, unspecified: Secondary | ICD-10-CM | POA: Diagnosis present

## 2020-04-09 DIAGNOSIS — S2242XA Multiple fractures of ribs, left side, initial encounter for closed fracture: Secondary | ICD-10-CM | POA: Diagnosis present

## 2020-04-09 DIAGNOSIS — Z7189 Other specified counseling: Secondary | ICD-10-CM | POA: Diagnosis not present

## 2020-04-09 DIAGNOSIS — Z853 Personal history of malignant neoplasm of breast: Secondary | ICD-10-CM | POA: Diagnosis not present

## 2020-04-09 DIAGNOSIS — Z8249 Family history of ischemic heart disease and other diseases of the circulatory system: Secondary | ICD-10-CM | POA: Diagnosis not present

## 2020-04-09 DIAGNOSIS — I469 Cardiac arrest, cause unspecified: Secondary | ICD-10-CM | POA: Diagnosis not present

## 2020-04-09 DIAGNOSIS — S72115S Nondisplaced fracture of greater trochanter of left femur, sequela: Secondary | ICD-10-CM | POA: Diagnosis not present

## 2020-04-09 DIAGNOSIS — R531 Weakness: Secondary | ICD-10-CM | POA: Insufficient documentation

## 2020-04-09 DIAGNOSIS — Z79890 Hormone replacement therapy (postmenopausal): Secondary | ICD-10-CM | POA: Diagnosis not present

## 2020-04-09 DIAGNOSIS — J9611 Chronic respiratory failure with hypoxia: Secondary | ICD-10-CM | POA: Diagnosis present

## 2020-04-09 DIAGNOSIS — E871 Hypo-osmolality and hyponatremia: Secondary | ICD-10-CM | POA: Diagnosis not present

## 2020-04-09 DIAGNOSIS — Z923 Personal history of irradiation: Secondary | ICD-10-CM | POA: Diagnosis not present

## 2020-04-09 DIAGNOSIS — R918 Other nonspecific abnormal finding of lung field: Secondary | ICD-10-CM | POA: Diagnosis not present

## 2020-04-09 DIAGNOSIS — S72112A Displaced fracture of greater trochanter of left femur, initial encounter for closed fracture: Secondary | ICD-10-CM | POA: Diagnosis present

## 2020-04-09 DIAGNOSIS — F17210 Nicotine dependence, cigarettes, uncomplicated: Secondary | ICD-10-CM | POA: Diagnosis present

## 2020-04-09 DIAGNOSIS — I6529 Occlusion and stenosis of unspecified carotid artery: Secondary | ICD-10-CM | POA: Diagnosis not present

## 2020-04-09 DIAGNOSIS — S79929A Unspecified injury of unspecified thigh, initial encounter: Secondary | ICD-10-CM | POA: Diagnosis not present

## 2020-04-09 DIAGNOSIS — Z9981 Dependence on supplemental oxygen: Secondary | ICD-10-CM | POA: Diagnosis not present

## 2020-04-09 DIAGNOSIS — C3491 Malignant neoplasm of unspecified part of right bronchus or lung: Secondary | ICD-10-CM | POA: Diagnosis not present

## 2020-04-09 DIAGNOSIS — I110 Hypertensive heart disease with heart failure: Secondary | ICD-10-CM | POA: Diagnosis present

## 2020-04-09 DIAGNOSIS — I10 Essential (primary) hypertension: Secondary | ICD-10-CM | POA: Diagnosis not present

## 2020-04-09 DIAGNOSIS — Z20822 Contact with and (suspected) exposure to covid-19: Secondary | ICD-10-CM | POA: Diagnosis present

## 2020-04-09 DIAGNOSIS — R2681 Unsteadiness on feet: Secondary | ICD-10-CM | POA: Diagnosis not present

## 2020-04-09 DIAGNOSIS — M6281 Muscle weakness (generalized): Secondary | ICD-10-CM | POA: Diagnosis not present

## 2020-04-09 DIAGNOSIS — Z515 Encounter for palliative care: Secondary | ICD-10-CM | POA: Insufficient documentation

## 2020-04-09 DIAGNOSIS — R040 Epistaxis: Secondary | ICD-10-CM | POA: Diagnosis not present

## 2020-04-09 DIAGNOSIS — R011 Cardiac murmur, unspecified: Secondary | ICD-10-CM | POA: Diagnosis present

## 2020-04-09 DIAGNOSIS — Z85118 Personal history of other malignant neoplasm of bronchus and lung: Secondary | ICD-10-CM | POA: Diagnosis not present

## 2020-04-09 DIAGNOSIS — J449 Chronic obstructive pulmonary disease, unspecified: Secondary | ICD-10-CM | POA: Diagnosis present

## 2020-04-09 DIAGNOSIS — E039 Hypothyroidism, unspecified: Secondary | ICD-10-CM | POA: Diagnosis present

## 2020-04-09 DIAGNOSIS — Z801 Family history of malignant neoplasm of trachea, bronchus and lung: Secondary | ICD-10-CM | POA: Diagnosis not present

## 2020-04-09 DIAGNOSIS — M2550 Pain in unspecified joint: Secondary | ICD-10-CM | POA: Diagnosis not present

## 2020-04-09 DIAGNOSIS — D649 Anemia, unspecified: Secondary | ICD-10-CM | POA: Diagnosis present

## 2020-04-09 DIAGNOSIS — Y92009 Unspecified place in unspecified non-institutional (private) residence as the place of occurrence of the external cause: Secondary | ICD-10-CM | POA: Diagnosis not present

## 2020-04-09 DIAGNOSIS — W010XXA Fall on same level from slipping, tripping and stumbling without subsequent striking against object, initial encounter: Secondary | ICD-10-CM | POA: Diagnosis present

## 2020-04-09 DIAGNOSIS — Z7982 Long term (current) use of aspirin: Secondary | ICD-10-CM | POA: Diagnosis not present

## 2020-04-09 DIAGNOSIS — Z7401 Bed confinement status: Secondary | ICD-10-CM | POA: Diagnosis not present

## 2020-04-09 DIAGNOSIS — R52 Pain, unspecified: Secondary | ICD-10-CM | POA: Diagnosis not present

## 2020-04-09 DIAGNOSIS — R2689 Other abnormalities of gait and mobility: Secondary | ICD-10-CM | POA: Diagnosis not present

## 2020-04-09 DIAGNOSIS — M6259 Muscle wasting and atrophy, not elsewhere classified, multiple sites: Secondary | ICD-10-CM | POA: Diagnosis not present

## 2020-04-09 DIAGNOSIS — Z803 Family history of malignant neoplasm of breast: Secondary | ICD-10-CM | POA: Diagnosis not present

## 2020-04-09 DIAGNOSIS — E785 Hyperlipidemia, unspecified: Secondary | ICD-10-CM | POA: Diagnosis present

## 2020-04-09 DIAGNOSIS — C3492 Malignant neoplasm of unspecified part of left bronchus or lung: Secondary | ICD-10-CM | POA: Diagnosis not present

## 2020-04-09 DIAGNOSIS — I509 Heart failure, unspecified: Secondary | ICD-10-CM | POA: Diagnosis present

## 2020-04-09 DIAGNOSIS — D62 Acute posthemorrhagic anemia: Secondary | ICD-10-CM | POA: Diagnosis not present

## 2020-04-09 NOTE — Consult Note (Signed)
Consultation Note Date: 04/09/2020   Patient Name: Kendra Watts  DOB: Aug 06, 1945  MRN: [id number]  Age / Sex: 74 y.o., female  PCP: Merrilee Seashore, MD Referring Physician: Shawna Clamp, MD  Reason for Consultation: Establishing goals of care  HPI/Patient Profile: 74 y.o. female  with past medical history of hypertension, hyperlipidemia, COPD, CHF, breast cancer with lumpectomy in 2017, and lung cancer with radiation treatment in December 2019 was seen in the ED on 04/08/20 for a fall at home. Patient reports walker slid out from under her causing her to fall on her left hip. Patient was hospitalized last month for acute blood loss due to recurrent nose bleeds - she was discharge to SNF rehab at that time, then discharged from rehab back home on 04/07/20.   ED Course: patient was found to have fracture of greater trochanter of left femur, left 3rd rib fracture, lung nodules with stable findings but new 0.31mm pulmonary nodule and two enlarged ~2cm lymph nodes in pelvis.   Patient faces treatment option decisions, advanced directive decisions, and anticipatory care needs.   Clinical Assessment and Goals of Care: I have reviewed medical records including EPIC notes, labs, and imaging. Received report from primary RN - no acute concerns.   Went to visit patient at bedside - no family/visitors present. Patient was lying in bed awake, alert, oriented, and able to participate in conversation. Patient denied pain while lying still. No signs or non-verbal gestures of pain or discomfort noted. No respiratory distress, increased work of breathing, or secretions noted.   Met with patient to discuss diagnosis, prognosis, GOC, EOL wishes, disposition, and options.  I introduced Palliative Medicine as specialized medical care for people living with serious illness. It focuses on providing relief from the symptoms and  stress of a serious illness. The goal is to improve quality of life for both the patient and the family.  We discussed a brief life review of the patient as well as functional and nutritional status. The patient never married and does not have any children. She has a small dog whom she considers her "baby." The patient worked 20 years at [employer] in [place] - she explained how she loved going to work and loved her job. She currently lives in an apartment by herself, but has three neighbors that often check on her and assist with errands/tasks as needed. The patient explained that prior to a couple of months ago, she was living completely independent - able to perform all ADLs, ambulate independently, drive, and grocery shop. Due to her recurrent nose bleeds that started in November 2019, they overtime have left her feeling very weak. She stated that starting a couple months ago, she found herself feeling increasingly week and wasn't even able to walk to her car. When asked about her nutrition and eating habits, she states that it's always been "here and there." The patient wears chronic 4L O2 at night and 2L PRN during the day. She is of [employer].   The patient  explained that she was admitted to the hospital last month due to nose bleed and was subsequently discharge to SNF rehab/[facility] due to weakness. The patient reports that on the day of discharge from rehab, Friday 9/17, she tripped over her oxygen tubing at the facility and fell. A physician assessed her at the time and told her she had not broken any bones. She returned home that day. The following day, Saturday 9/18, the patient states that she was reaching for her walker and slipped again - she had to slide over to her phone and call her neighbor for help, whom then called EMS and was brought to the hospital.   We discussed patient's current illness and what it means in the larger context of patient's on-going  co-morbidities. The patient had a clear understanding of her current medical situation, including the new pulmonary nodule and enlarged lymph nodes noted this admission. Natural disease trajectory and expectations at EOL were discussed. I attempted to elicit values and goals of care important to the patient. The difference between aggressive medical intervention and comfort care was considered in light of the patient's goals of care. Ms. Stribling goal is for full scope treatment while in house, discharge to rehab, with the ultimate goal to return home. She expresses sadness of having to return to rehab, but is wanting and willing to go back if necessary to improve her physical functioning - she wants to return home to her dog.   Advance directives, concepts specific to code status, artificial feeding and hydration, and rehospitalization were considered and discussed. Ms. Labuda states that she does have a living will and HCPOA - asked if one of her neighbors or her nephew could bring a copy for our records - she was not sure if they would be able to find/access it at her home but would try and provide copies. She stated that she wanted to change her HCPOA to reflect her nephew instead of her sister - offered chaplain services to assist in updating her 58 - she was agreeable. MOST form was explained and reviewed - patient does not have her glasses - she requested to review again and complete once she is able to read it - educated that any provider, inpatient or outpatient, can help her complete the form when/if she is interested. Blank form left at her bedside. Encouraged patient to consider DNR/DNI status understanding evidenced based poor outcomes in similar hospitalized patient, as the cause of arrest is likely associated with advanced chronic illness rather than an easily reversible acute cardio-pulmonary event. Patient was NOT agreeable to DNR/DNI with understanding that she would receive CPR,  defibrillation, ACLS medications, and intubation.   Palliative Care services outpatient were explained and offered - she thoughtfully declined.   Discussed with patient the importance of continued conversation with family and the medical providers regarding overall plan of care and treatment options, ensuring decisions are within the context of the patient's values and GOCs.    Questions and concerns were addressed. The family was encouraged to call with questions or concerns. PMT card was provided.   Primary Decision Maker: PATIENT    SUMMARY OF RECOMMENDATIONS  Continue full scope medical treatment  Continue full code status  Patient is willing to return to rehab when medically stable for discharge, she recognizes she is not safe to return home alone in her current condition. She is open to return to [facility] if they have a bed.   She is waiting for Dr. Clabe Seal input  on her most recent CT/new lung nodule before deciding on next steps  Blank MOST form left at bedside - help patient complete or notify PMT if she becomes interested in finalizing  She declined outpatient Palliative Care follow up  Chaplain consult placed - she would like assistance naming her nephew/[place] her HCPOA  Place copy of Living Will into EMR if family/friend is able to bring  PMT will continue to follow peripherally and wait for recommendations from Dr. Sondra Come - can continue [place] discussions at that time. If there are any imminent needs please call the service directly  Code Status/Advance Care Planning:  Full code  Palliative Prophylaxis:   Aspiration, Bowel Regimen, Delirium Protocol, Frequent Pain Assessment, Oral Care and Turn Reposition  Additional Recommendations (Limitations, Scope, Preferences):  Full Scope Treatment  Psycho-social/Spiritual:   Desire for further Chaplaincy support:yes  Created space and opportunity for patient to express thoughts and feelings regarding  patient's current medical situation.   Prognosis:   Unable to determine  Discharge Planning: To Be Determined   likely SNF rehab      Primary Diagnoses: Present on Admission: . Adenocarcinoma, lung, right ([place]) . Chronic respiratory failure with hypoxia ([place]) . Essential hypertension . Multiple lung nodules . Squamous cell lung cancer, left ([place]) . Chronic obstructive pulmonary disease ([place]) . Fracture of one rib, left side, initial encounter for closed fracture . Fracture of greater trochanter of left femur ([place]) . Closed fracture of greater trochanter of left femur ([place])   I have reviewed the medical record, interviewed the patient and family, and examined the patient. The following aspects are pertinent.  Past Medical History:  Diagnosis Date  . Anemia   . 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 Breast cancer ([place]) 09/22/2015   Malignant  . Breast cancer of upper-outer quadrant of left female breast ([place]) 09/08/2015  . Cataract, immature   . CHF (congestive heart failure) ([place])    Acute CHF-06/2018  . COPD (chronic obstructive pulmonary disease) ([place])   . Heart murmur    states no known problems  . History of hyperthyroidism   . Hyperlipidemia   . Hypertension    states under control with meds., has been on med. x "long time"  . Hypokalemia    from last physical.   . Hypothyroidism   . Nonfunctioning kidney    left  . Personal history of radiation therapy    2017  . Pulmonary nodules    Bilateral  . Radiation 10/30/15-11/28/15   left breast 42.72 Gy, boosted to 10 Gy  . Renal artery stenosis ([place])   . Tobacco abuse   . Wears partial dentures    upper and lower   Social History   Socioeconomic History  . Marital status: Divorced    Spouse name: Not on file  . Number of children: 0  . Years of education: Not on file  . Highest education level: Not on file  Occupational History  . Occupation: HR Technical brewer: [employer]  Tobacco  Use  . Smoking status: Current Every Day Smoker    Packs/day: 0.50    Years: 40.00    Pack years: 20.00    Types: Cigarettes  . Smokeless tobacco: Never Used  . Tobacco comment: as of 09/14/2018, less than 1 pack/day  Vaping Use  . Vaping Use: Never used  Substance and Sexual Activity  . Alcohol use: Yes    Comment: occasionally  . Drug  use: No  . Sexual activity: Never  Other Topics Concern  . Not on file  Social History Narrative  . Not on file   Social Determinants of Health   Financial Resource Strain:   . Difficulty of Paying Living Expenses: Not on file  Food Insecurity:   . Worried About Charity fundraiser in the Last Year: Not on file  . Ran Out of Food in the Last Year: Not on file  Transportation Needs:   . Lack of Transportation (Medical): Not on file  . Lack of Transportation (Non-Medical): Not on file  Physical Activity:   . Days of Exercise per Week: Not on file  . Minutes of Exercise per Session: Not on file  Stress:   . Feeling of Stress : Not on file  Social Connections:   . Frequency of Communication with Friends and Family: Not on file  . Frequency of Social Gatherings with Friends and Family: Not on file  . Attends Religious Services: Not on file  . Active Member of Clubs or Organizations: Not on file  . Attends Archivist Meetings: Not on file  . Marital Status: Not on file   Family History  Problem Relation Age of Onset  . Heart disease Mother        MI @ 54, died at 21  . Cancer Father   . Lung cancer Father   . Heart disease Maternal Grandmother   . Lung cancer Sister   . Breast cancer Paternal Aunt    Scheduled Meds: . arformoterol  15 mcg Nebulization BID  . aspirin EC  81 mg Oral Daily  . doxazosin  8 mg Oral Daily  . enoxaparin (LOVENOX) injection  20 mg Subcutaneous Q24H  . levothyroxine  50 mcg Oral Q0600  . metoprolol tartrate  25 mg Oral Daily  . multivitamin with minerals  1 tablet Oral Daily  . nicotine  14 mg  Transdermal Daily  . pantoprazole  40 mg Oral Daily  . rosuvastatin  10 mg Oral Daily  . tamoxifen  20 mg Oral Daily  . umeclidinium bromide  1 puff Inhalation Daily   Continuous Infusions: PRN Meds:.acetaminophen **OR** acetaminophen, albuterol, HYDROcodone-acetaminophen, ibuprofen, ondansetron **OR** ondansetron (ZOFRAN) IV, polyethylene glycol Medications Prior to Admission:  Prior to Admission medications   Medication Sig Start Date End Date Taking? Authorizing Provider  acetaminophen (TYLENOL) 325 MG tablet Take 325 mg by mouth every 6 (six) hours as needed for mild pain or moderate pain.   Yes [provider]  albuterol (VENTOLIN HFA) 108 (90 Base) MCG/ACT inhaler Inhale 2 puffs into the lungs every 6 (six) hours as needed for wheezing or shortness of breath. 02/02/19  Yes [name], MD  aspirin 81 MG tablet Take 1 tablet (81 mg total) by mouth daily. 03/21/20  Yes Hongalgi, Lenis Dickinson, MD  Calcium-Magnesium-Vitamin D (CALCIUM 1200+D3 PO) Take 1 tablet by mouth daily.   Yes [provider]  doxazosin (CARDURA) 8 MG tablet Take 8 mg by mouth daily.    Yes [provider]  furosemide (LASIX) 20 MG tablet Take 1 tablet (20 mg total) by mouth daily. 07/13/18 04/08/20 Yes Hall, Carole N, DO  ibandronate (BONIVA) 150 MG tablet Take 150 mg by mouth every 30 (thirty) days. 01/19/20  Yes [provider]  levothyroxine (SYNTHROID) 50 MCG tablet Take 50 mcg by mouth daily. 12/08/19  Yes [provider]  metoprolol tartrate (LOPRESSOR) 25 MG tablet Take 25 mg by mouth  daily. 02/19/20  Yes [provider]  Multiple Vitamins-Minerals (MULTIVITAMIN WITH MINERALS) tablet Take 1 tablet by mouth daily.    Yes [provider]  pantoprazole (PROTONIX) 40 MG tablet Take 1 tablet (40 mg total) by mouth daily. 07/14/18  Yes Irene Pap N, DO  rosuvastatin (CRESTOR) 10 MG tablet Take 10 mg by mouth daily.    Yes [provider]  tamoxifen  (NOLVADEX) 20 MG tablet Take 1 tablet (20 mg total) by mouth daily. 01/25/20  Yes Nicholas Lose, MD  Tiotropium Bromide-Olodaterol (STIOLTO RESPIMAT) 2.5-2.5 MCG/ACT AERS Inhale 2 puffs into the lungs daily. 10/27/19  Yes [name], MD  nicotine (NICODERM CQ - DOSED IN MG/24 HOURS) 14 mg/24hr patch Place 1 patch (14 mg total) onto the skin daily. Patient not taking: Reported on 04/08/2020 03/19/20   Modena Jansky, MD   No Known Allergies Review of Systems  Constitutional: Positive for fatigue.  HENT: Positive for nosebleeds.   Neurological: Positive for weakness.  All other systems reviewed and are negative.   Physical Exam Vitals and nursing note reviewed.  Constitutional:      General: She is not in acute distress. Pulmonary:     Effort: No respiratory distress.  Musculoskeletal:        General: Tenderness present.  Skin:    General: Skin is warm and dry.  Neurological:     Mental Status: She is alert and oriented to person, place, and time.     Motor: Weakness present.  Psychiatric:        Attention and Perception: Attention normal.        Behavior: Behavior is cooperative.        Cognition and Memory: Cognition and memory normal.     Vital Signs: BP (!) 150/123   Pulse 71   Temp 97.8 F (36.6 C) (Oral)   Resp 20   Ht 5' (1.524 m)   Wt 40 kg   SpO2 99%   BMI 17.22 kg/m  Pain Scale: 0-10   Pain Score: 8    SpO2: SpO2: 99 % O2 Device:SpO2: 99 % O2 Flow Rate: .O2 Flow Rate (L/min): 4 L/min  IO: Intake/output summary: No intake or output data in the 24 hours ending 04/09/20 1445  LBM:   Baseline Weight: Weight: 40 kg Most recent weight: Weight: 40 kg     Palliative Assessment/Data: PPS 50%     Time In: 1630 Time Out: 1740 Time Total: 70 minutes  Greater than 50%  of this time was spent counseling and coordinating care related to the above assessment and plan.  Signed by: Lin Landsman, NP   Please contact Palliative Medicine Team phone at  [phone] for questions and concerns.  For individual provider: See Shea Evans

## 2020-04-09 NOTE — ED Notes (Signed)
MS Breakfast Ordered 

## 2020-04-09 NOTE — Evaluation (Signed)
Physical Therapy Evaluation Patient Details Name: Kendra Watts MRN: [id number] DOB: 06-10-1946 Today's Date: 04/09/2020   History of Present Illness  74 y.o. female with medical history significant of breast CA, COPD on chronic home 4L O2, CHF, HTN, recent admit for acute blood loss anemia due to nose bleed and discharged to SNF with return home 9/16 presented to ED 04/08/20 s/p fall with Lt greater trochanteric femur fx, L 3rd rib fx.   Clinical Impression   Pt admitted with above diagnosis. Patient seen in ED on stretcher. Patient's short stature and height of stretcher did not safely permit attempt to stand. In sitting, she was laterally shifted onto her rt hip/pelvis to offload painful left hip. Patient reports she first fell in the SNF prior to her discharge and thinks this is when she broke her hip as it was very sore. She discharged home alone and could not mobilize well due to pain and fell again.  Pt currently with functional limitations due to the deficits listed below (see PT Problem List). Pt will benefit from skilled PT to increase their independence and safety with mobility to allow discharge to the venue listed below.       Follow Up Recommendations SNF (of note, pt is not sure she would want to return to [facility]--would like to know her options)    Equipment Recommendations  Other (comment) (TBD next venue)    Recommendations for Other Services OT consult     Precautions / Restrictions Precautions Precautions: Fall Restrictions Weight Bearing Restrictions: Yes LLE Weight Bearing: Weight bearing as tolerated      Mobility  Bed Mobility Overal bed mobility: Needs Assistance Bed Mobility: Supine to Sit;Sit to Supine;Rolling Rolling: Max assist (with rail to her right)   Supine to sit: Mod assist;HOB elevated (on ED stretcher) Sit to supine: Mod assist      Transfers                 General transfer comment: unsafe to attempt as pt only 5\' 0"  tall  and on ED stretcher (concern would not tolerate weight on LLE and be unable to return to stretcher safely with +1 assist)  Ambulation/Gait                Stairs            Wheelchair Mobility    Modified Rankin (Stroke Patients Only)       Balance Overall balance assessment: Needs assistance Sitting-balance support: Bilateral upper extremity supported;Feet unsupported Sitting balance-Leahy Scale: Poor Sitting balance - Comments: shifts onto rt hemi-pelvis in sitting with UE support due to left hip pain Postural control: Right lateral lean                                   Pertinent Vitals/Pain Pain Assessment: Faces Faces Pain Scale: Hurts whole lot (with movement; denied pain at rest) Pain Location: left hip Pain Descriptors / Indicators: Discomfort;Grimacing Pain Intervention(s): Limited activity within patient's tolerance;Monitored during session;Repositioned    Home Living Family/patient expects to be discharged to:: Skilled nursing facility Living Arrangements: Alone                    Prior Function Level of Independence: Independent with assistive device(s)         Comments: recently d/c'd home from SNF with rollator     Hand Dominance   Dominant Hand: Right  Extremity/Trunk Assessment   Upper Extremity Assessment Upper Extremity Assessment: Generalized weakness    Lower Extremity Assessment Lower Extremity Assessment: Generalized weakness;LLE deficits/detail LLE Deficits / Details: AAROM hip flexion to 90, abdct to 20;  LLE: Unable to fully assess due to pain    Cervical / Trunk Assessment Cervical / Trunk Assessment: Kyphotic  Communication   Communication: HOH  Cognition Arousal/Alertness: Awake/alert Behavior During Therapy: WFL for tasks assessed/performed Overall Cognitive Status: Within Functional Limits for tasks assessed                                        General Comments       Exercises General Exercises - Lower Extremity Ankle Circles/Pumps: AROM;Both;5 reps Quad Sets: AROM;Left;5 reps Heel Slides: AROM;Right;AAROM;Left;5 reps Hip ABduction/ADduction: AAROM;Left;5 reps   Assessment/Plan    PT Assessment Patient needs continued PT services  PT Problem List Decreased strength;Decreased range of motion;Decreased activity tolerance;Decreased balance;Decreased mobility;Decreased knowledge of use of DME;Pain       PT Treatment Interventions DME instruction;Therapeutic activities;Gait training;Therapeutic exercise;Patient/family education;Balance training;Functional mobility training;Neuromuscular re-education    PT Goals (Current goals can be found in the Care Plan section)  Acute Rehab PT Goals Patient Stated Goal: get strong enough to return home alone PT Goal Formulation: With patient Time For Goal Achievement: 04/23/20 Potential to Achieve Goals: Good    Frequency Min 3X/week   Barriers to discharge Decreased caregiver support      Co-evaluation               AM-PAC PT "6 Clicks" Mobility  Outcome Measure Help needed turning from your back to your side while in a flat bed without using bedrails?: A Lot Help needed moving from lying on your back to sitting on the side of a flat bed without using bedrails?: A Lot Help needed moving to and from a bed to a chair (including a wheelchair)?: Total Help needed standing up from a chair using your arms (e.g., wheelchair or bedside chair)?: Total Help needed to walk in hospital room?: Total Help needed climbing 3-5 steps with a railing? : Total 6 Click Score: 8    End of Session Equipment Utilized During Treatment: Oxygen Activity Tolerance: Other (comment) (limited by height of ED stretcher and pt's short stature) Patient left: in bed;with call bell/phone within reach Nurse Communication: Mobility status;Other (comment) (NT in to assist with changing linens; replace purewick) PT Visit Diagnosis:  Muscle weakness (generalized) (M62.81);History of falling (Z91.81);Difficulty in walking, not elsewhere classified (R26.2);Pain Pain - Right/Left: Left Pain - part of body: Hip    Time: 6237-6283 PT Time Calculation (min) (ACUTE ONLY): 36 min   Charges:   PT Evaluation $PT Eval Moderate Complexity: 1 Mod PT Treatments $Therapeutic Exercise: 8-22 mins         Arby Barrette, PT Pager [phone]   Rexanne Mano 04/09/2020, 12:07 PM

## 2020-04-09 NOTE — TOC Initial Note (Addendum)
Transition of Care [facility]) - Initial/Assessment Note    Patient Details  Name: Kendra Watts MRN: [id number] Date of Birth: Mar 31, 1946  Transition of Care [facility]) CM/SW Contact:    Verdell Carmine, RN Phone Number: 04/09/2020, 9:12 AM  Clinical Narrative:                 Readmit from 3 weeks ago. Was discharged from rehab SNF last week was at home fell, now with femur fx. Will need to be placed in [facility] CIR  post discharge. By MD notes patient is non-surgical.   PT /OT has been consulted will follow for their recommendations.  Expected Discharge Plan: Skilled Nursing Facility Barriers to Discharge: Continued Medical Work up   Patient Goals and CMS Choice        Expected Discharge Plan and Services Expected Discharge Plan: [place] In-house Referral: Clinical Social Work Discharge Planning Services: CM Consult Post Acute Care Choice: [place] Living arrangements for the past 2 months: Apartment                                      Prior Living Arrangements/Services Living arrangements for the past 2 months: Apartment Lives with:: Self Patient language and need for interpreter reviewed:: Yes        Need for Family Participation in Patient Care: Yes (Comment) Care giver support system in place?: Yes (comment)   Criminal Activity/Legal Involvement Pertinent to Current Situation/Hospitalization: No - Comment as needed  Activities of Daily Living      Permission Sought/Granted   Permission granted to share information with : Yes, Verbal Permission Granted              Emotional Assessment       Orientation: : Oriented to Self, Oriented to Place, Oriented to  Time, Oriented to Situation Alcohol / Substance Use: Not Applicable Psych Involvement: No (comment)  Admission diagnosis:  Closed fracture of greater trochanter of left femur ([place]) [S72.112A] Patient Active Problem List   Diagnosis Date Noted  . Fracture of one  rib, left side, initial encounter for closed fracture 04/08/2020  . Fracture of greater trochanter of left femur ([place]) 04/08/2020  . Closed fracture of greater trochanter of left femur ([place]) 04/08/2020  . Epistaxis 03/13/2020  . Acute blood loss anemia 03/13/2020  . Current smoker   . Hypertension, uncontrolled   . 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 Hemoptysis 09/14/2018  . 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 Adenocarcinoma, lung, right ([place]) 09/10/2018  . Multiple lung nodules 07/20/2018  . Chronic obstructive pulmonary disease ([place]) 07/20/2018  . Acute CHF (congestive heart failure) ([place]) 07/06/2018  . Chronic respiratory failure with hypoxia ([place]) 07/06/2018  . Hyponatremia 07/06/2018  . Anemia 07/06/2018  . Breast cancer of upper-outer quadrant of left female breast ([place]) 09/08/2015  . Renal artery stenosis ([place]) 08/23/2014  . Right upper extremity numbness 07/01/2013  . Atherosclerotic renal artery stenosis, bilateral ([place]) 05/26/2013  . Peripheral arterial disease ([place]) 05/26/2013  . Essential hypertension 05/26/2013  . Hyperlipidemia 05/26/2013  . Tobacco abuse 05/26/2013  . Carotid artery stenosis 03/09/2012   PCP:  Merrilee Seashore, MD Pharmacy:   [id number] - [place], [address] AT [address] [place] [address] Phone: [phone] Fax: [phone]     Social Determinants of Health (SDOH)  Interventions    Readmission Risk Interventions Readmission Risk Prevention Plan 03/16/2020  Transportation Screening Complete  PCP or Specialist Appt within 3-5 Days Complete  HRI or [place] Complete  Social Work Consult for [place] Planning/Counseling Complete  Palliative Care Screening Not Applicable  Medication Review Press photographer) Complete  Some recent data might be hidden

## 2020-04-09 NOTE — Consult Note (Signed)
ORTHOPAEDIC CONSULTATION  REQUESTING PHYSICIAN: Shawna Clamp, MD  Chief Complaint: left hip pain  HPI: Kendra Watts is a 74 y.o. 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 Sondra Come, COPD on chronic home 4L O2.  Patient had a fall on 04/08/2020 when her oxygen cord caused her to trip, her walker slid out from under her, and she fell landing on her left side. She was unable to put weight on her left side due to pain. She had just recently been discharged from SNF. Recently was hospitalized due to acute blood loss anemia. EMS was called and the patient was taken to [facility] Emergency Department. Work-up was significant for left greater trochanteric femur fracture and left sided rib fracture. Orthopedics was consulted for evaluation.  Patient resting in hospital bed. Reports pain in her left hip. No pain to any other extremity. She lives at home alone. Recently was discharged from SNF. She walks with a walker at baseline. She reports a previous tibia fracture on the left side. Pain has improved since yesterday when she fell.   Past Medical History:  Diagnosis Date  . Anemia   . 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 Breast cancer ([place]) 09/22/2015   Malignant  . Breast cancer of upper-outer quadrant of left female breast ([place]) 09/08/2015  . Cataract, immature   . CHF (congestive heart failure) ([place])    Acute CHF-06/2018  . COPD (chronic obstructive pulmonary disease) ([place])   . Heart murmur    states no known problems  . History of hyperthyroidism   . Hyperlipidemia   . Hypertension    states under control with meds., has been on med. x "long time"  . Hypokalemia    from last physical.   . Hypothyroidism   . Nonfunctioning kidney    left  . Personal history of radiation therapy    2017  . Pulmonary nodules    Bilateral  .  Radiation 10/30/15-11/28/15   left breast 42.72 Gy, boosted to 10 Gy  . Renal artery stenosis ([place])   . Tobacco abuse   . Wears partial dentures    upper and lower   Past Surgical History:  Procedure Laterality Date  . ABDOMINAL ANGIOGRAM  02/18/2012   Procedure: ABDOMINAL ANGIOGRAM;  Surgeon: Lorretta Harp, MD;  Location: [facility] CATH LAB;  Service: Cardiovascular;;  . ABDOMINAL AORTAGRAM  07/04/2014  . ABDOMINAL HYSTERECTOMY  ~ 1977   partial  . APPENDECTOMY    . ARCH AORTOGRAM    . BREAST LUMPECTOMY Left 09/22/2015   Malignant  . CAROTID ANGIOGRAM N/A 02/18/2012   Procedure: CAROTID ANGIOGRAM;  Surgeon: Lorretta Harp, MD;  Location: [facility] CATH LAB;  Service: Cardiovascular;  Laterality: N/A;  . ENDARTERECTOMY  04/02/2012   Procedure: ENDARTERECTOMY CAROTID;  Surgeon: Serafina Mitchell, MD;  Location: [facility] OR;  Service: Vascular;  Laterality: Right;  . FUDUCIAL PLACEMENT Bilateral 09/02/2018   Procedure: Placement Of Fiducial to right upper lobe & left upper lobe lung;  Surgeon: [name], MD;  Location: [facility] OR;  Service: Thoracic;  Laterality: Bilateral;  . IR THORACENTESIS ASP PLEURAL SPACE W/IMG GUIDE  07/08/2018  . RADIOACTIVE SEED GUIDED PARTIAL MASTECTOMY WITH AXILLARY SENTINEL LYMPH NODE BIOPSY Left 09/22/2015   Procedure: INJECT BLUE DYE LEFT BREAST,RADIOACTIVE SEED GUIDED PARTIAL MASTECTOMY WITH AXILLARY SENTINEL  LYMPH NODE BIOPSY;  Surgeon: Fanny Skates, MD;  Location: [place];  Service: General;  Laterality: Left;  . RENAL ANGIOGRAM Left 06/08/2010   renal artery stent -  5x12 Genesis on Aviator balloon stent (Dr. Adora Fridge)  . RENAL ANGIOGRAM Right 07/04/2014   Procedure: RENAL ANGIOGRAM;  Surgeon: Lorretta Harp, MD;  Location: [facility] CATH LAB;  Service: Cardiovascular;  Laterality: Right;  . RENAL ANGIOGRAM Right 08/22/2014   Procedure: RENAL ANGIOGRAM;  Surgeon: Lorretta Harp, MD;  Location: [facility] CATH LAB;  Service: Cardiovascular;  Laterality: Right;  .  TONSILLECTOMY     as a child  . VIDEO BRONCHOSCOPY WITH ENDOBRONCHIAL NAVIGATION N/A 09/02/2018   Procedure: VIDEO BRONCHOSCOPY WITH ENDOBRONCHIAL NAVIGATION;  Surgeon: [name], MD;  Location: [facility] OR;  Service: Thoracic;  Laterality: N/A;   Social History   Socioeconomic History  . Marital status: Divorced    Spouse name: Not on file  . Number of children: 0  . Years of education: Not on file  . Highest education level: Not on file  Occupational History  . Occupation: HR Technical brewer: [employer]  Tobacco Use  . Smoking status: Current Every Day Smoker    Packs/day: 0.50    Years: 40.00    Pack years: 20.00    Types: Cigarettes  . Smokeless tobacco: Never Used  . Tobacco comment: as of 09/14/2018, less than 1 pack/day  Vaping Use  . Vaping Use: Never used  Substance and Sexual Activity  . Alcohol use: Yes    Comment: occasionally  . Drug use: No  . Sexual activity: Never  Other Topics Concern  . Not on file  Social History Narrative  . Not on file   Social Determinants of Health   Financial Resource Strain:   . Difficulty of Paying Living Expenses: Not on file  Food Insecurity:   . Worried About Charity fundraiser in the Last Year: Not on file  . Ran Out of Food in the Last Year: Not on file  Transportation Needs:   . Lack of Transportation (Medical): Not on file  . Lack of Transportation (Non-Medical): Not on file  Physical Activity:   . Days of Exercise per Week: Not on file  . Minutes of Exercise per Session: Not on file  Stress:   . Feeling of Stress : Not on file  Social Connections:   . Frequency of Communication with Friends and Family: Not on file  . Frequency of Social Gatherings with Friends and Family: Not on file  . Attends Religious Services: Not on file  . Active Member of Clubs or Organizations: Not on file  . Attends Archivist Meetings: Not on file  . Marital Status: Not on file   Family History  Problem  Relation Age of Onset  . Heart disease Mother        MI @ 9, died at 2  . Cancer Father   . Lung cancer Father   . Heart disease Maternal Grandmother   . Lung cancer Sister   . Breast cancer Paternal Aunt    No Known Allergies Prior to Admission medications   Medication Sig Start Date End Date Taking? Authorizing Provider  acetaminophen (TYLENOL) 325 MG tablet Take 325 mg by mouth every 6 (six) hours as needed for mild pain or moderate pain.   Yes [provider]  albuterol (VENTOLIN HFA) 108 (90 Base) MCG/ACT inhaler Inhale 2 puffs into the  lungs every 6 (six) hours as needed for wheezing or shortness of breath. 02/02/19  Yes [name], MD  aspirin 81 MG tablet Take 1 tablet (81 mg total) by mouth daily. 03/21/20  Yes Hongalgi, Lenis Dickinson, MD  Calcium-Magnesium-Vitamin D (CALCIUM 1200+D3 PO) Take 1 tablet by mouth daily.   Yes [provider]  doxazosin (CARDURA) 8 MG tablet Take 8 mg by mouth daily.    Yes [provider]  furosemide (LASIX) 20 MG tablet Take 1 tablet (20 mg total) by mouth daily. 07/13/18 04/08/20 Yes Hall, Carole N, DO  ibandronate (BONIVA) 150 MG tablet Take 150 mg by mouth every 30 (thirty) days. 01/19/20  Yes [provider]  levothyroxine (SYNTHROID) 50 MCG tablet Take 50 mcg by mouth daily. 12/08/19  Yes [provider]  metoprolol tartrate (LOPRESSOR) 25 MG tablet Take 25 mg by mouth daily. 02/19/20  Yes [provider]  Multiple Vitamins-Minerals (MULTIVITAMIN WITH MINERALS) tablet Take 1 tablet by mouth daily.    Yes [provider]  pantoprazole (PROTONIX) 40 MG tablet Take 1 tablet (40 mg total) by mouth daily. 07/14/18  Yes Irene Pap N, DO  rosuvastatin (CRESTOR) 10 MG tablet Take 10 mg by mouth daily.    Yes [provider]  tamoxifen (NOLVADEX) 20 MG tablet Take 1 tablet (20 mg total) by mouth daily. 01/25/20  Yes Nicholas Lose, MD  Tiotropium Bromide-Olodaterol (STIOLTO RESPIMAT)  2.5-2.5 MCG/ACT AERS Inhale 2 puffs into the lungs daily. 10/27/19  Yes [name], MD  nicotine (NICODERM CQ - DOSED IN MG/24 HOURS) 14 mg/24hr patch Place 1 patch (14 mg total) onto the skin daily. Patient not taking: Reported on 04/08/2020 03/19/20   Modena Jansky, MD   DG Chest 2 View  Result Date: 04/08/2020 CLINICAL DATA:  Fall EXAM: CHEST - 2 VIEW COMPARISON:  March 13, 2020 FINDINGS: The cardiomediastinal silhouette is unchanged in contour.Surgical clips project over the RIGHT neck and LEFT thorax. Vascular calcifications. Tortuous thoracic aorta. No pleural effusion. No pneumothorax. No acute pleuroparenchymal abnormality. Visualized abdomen is unremarkable. Mildly displaced LEFT lateral third and fourth rib fractures. IMPRESSION: Mildly displaced LEFT third and fourth rib fractures. No pneumothorax. Electronically Signed   By: Valentino Saxon MD   On: 04/08/2020 14:59   DG Lumbar Spine Complete  Result Date: 04/08/2020 CLINICAL DATA:  Fall EXAM: LUMBAR SPINE - COMPLETE 4+ VIEW COMPARISON:  None. FINDINGS: There are five non-rib bearing lumbar-type vertebral bodies. No spondylolisthesis. There is levocurvature of the mid lumbar spine. There is no evidence for acute fracture or subluxation. Intervertebral disc space height loss is moderate at L2-3 with multilevel endplate proliferative changes. Mild lower lumbar facet arthropathy. Vascular calcifications. Renal artery stents. IMPRESSION: 1. No acute fracture or subluxation of the lumbar spine. 2. Multilevel degenerative disc disease, most pronounced at L2-3. Electronically Signed   By: Valentino Saxon MD   On: 04/08/2020 15:02   DG Pelvis 1-2 Views  Result Date: 04/08/2020 CLINICAL DATA:  Fall EXAM: PELVIS - 1-2 VIEW; LEFT FEMUR 2 VIEWS COMPARISON:  None. FINDINGS: There is a comminuted of LEFT greater trochanteric fracture. There is minimal displacement of the tip of the greater trochanter. No definite extension through the femoral  neck but evaluation is limited secondary to osteopenia. Vascular calcifications. No pelvic diastasis. Mild degenerative changes of the lower lumbar spine. Limited assessment of the contralateral hip is unremarkable IMPRESSION: Comminuted of the LEFT greater trochanteric fracture with minimal displacement of the tip of the  greater trochanter. No definite extension through the femoral neck but evaluation is limited secondary to osteopenia. Electronically Signed   By: Meda Klinefelter MD   On: 04/08/2020 14:55   DG Tibia/Fibula Left  Result Date: 04/08/2020 CLINICAL DATA:  Fall EXAM: LEFT TIBIA AND FIBULA - 2 VIEW COMPARISON:  None. FINDINGS: Osteopenia. No acute fracture or dislocation. Joint spaces and alignment are maintained. No area of erosion or osseous destruction. No unexpected radiopaque foreign body. Vascular calcifications. IMPRESSION: 1. No acute osseous abnormality in the left tibia or fibula. Electronically Signed   By: Meda Klinefelter MD   On: 04/08/2020 15:04   CT Head Wo Contrast  Result Date: 04/08/2020 CLINICAL DATA:  Fall with LEFT hip pain EXAM: CT HEAD WITHOUT CONTRAST CT CERVICAL SPINE WITHOUT CONTRAST TECHNIQUE: Multidetector CT imaging of the head and cervical spine was performed following the standard protocol without intravenous contrast. Multiplanar CT image reconstructions of the cervical spine were also generated. COMPARISON:  September 05, 2019. FINDINGS: CT HEAD FINDINGS Brain: No evidence of acute infarction, hemorrhage, hydrocephalus, extra-axial collection or mass lesion/mass effect. Periventricular white matter hypodensities consistent with sequela of chronic microvascular ischemic disease. Mildly advanced global volume loss for age. Vascular: Vascular calcifications of bilateral carotid siphons and vertebral arteries. Skull: No acute skull fracture.  Osteopenia. Sinuses/Orbits: Mucosal thickening of the RIGHT greater than LEFT maxillary sinus. Mucosal thickening of the  RIGHT sphenoid sinus. Other: RIGHT ear cerumen. CT CERVICAL SPINE FINDINGS Alignment: Exaggerated lordosis, unchanged. No spondylolisthesis or traumatic subluxation. Skull base and vertebrae: No acute fracture. No primary bone lesion or focal pathologic process. Incomplete fusion of the posterior arch of C1. Osteopenia. Soft tissues and spinal canal: No prevertebral fluid or swelling. No visible canal hematoma. Disc levels: Intervertebral disc space height loss at C4-5. Multilevel facet arthropathy. Upper chest: Revisualization of an irregular RIGHT upper lobe pulmonary nodule measuring approximately 8 by 11 mm, similar in comparison to prior. Adjacent fiducial marker. Paraseptal emphysema. Other: Surgical clips in the RIGHT neck status post endarterectomy. Severe LEFT carotid atherosclerotic calcifications. IMPRESSION: 1.  No acute intracranial abnormality. 2.  No acute fracture or static subluxation of the cervical spine. Emphysema (ICD10-J43.9). Electronically Signed   By: Meda Klinefelter MD   On: 04/08/2020 14:04   CT Chest W Contrast  Result Date: 04/08/2020 CLINICAL DATA:  Status post trauma. EXAM: CT CHEST, ABDOMEN, AND PELVIS WITH CONTRAST TECHNIQUE: Multidetector CT imaging of the chest, abdomen and pelvis was performed following the standard protocol during bolus administration of intravenous contrast. CONTRAST:  OMNIPAQUE IOHEXOL 300 MG/ML  SOLN COMPARISON:  None. November 15, 2019 FINDINGS: CT CHEST FINDINGS Cardiovascular: There is marked severity calcification of the thoracic aorta and great vessels. Normal heart size with marked severity coronary artery calcification. No pericardial effusion. Mediastinum/Nodes: No enlarged mediastinal, hilar, or axillary lymph nodes. Thyroid gland, trachea, and esophagus demonstrate no significant findings. Lungs/Pleura: There is moderate severity emphysematous lung disease. A stable 1.0 cm x 0.7 cm noncalcified lung nodule is seen within the posteromedial  aspect of the right apex. Small metallic density surgical material is also seen within this region. Stable moderate severity scarring and/or atelectasis is seen within the posterior aspect of the left upper lobe. A stable 1.6 cm x 0.7 cm noncalcified nodular component is seen (axial CT image 37, CT series number 5). Small metallic density surgical material is also seen within this area. A stable 2 mm posteromedial left lower lobe noncalcified lung nodule is seen (axial CT image 91,  CT series number 5). A 0.6 cm pleural based noncalcified lung nodule is seen within the posterior aspect of the left lower lobe (axial CT image 102, CT series number 5). This represents a new finding when compared to the prior exam. A stable 0.9 cm x 0.6 cm mildly lobulated noncalcified lung nodule is seen within the posterolateral aspect of the right lower lobe (axial CT image 103, CT series number 5). Stable mild pleural thickening is seen along the posterior aspect of the left lung base. This represents a new finding when compared to the prior study. Musculoskeletal: An acute lateral third left rib fracture is seen. A stable lateral fourth left rib fracture is noted. A chronic compression fracture deformity is seen involving the T3 vertebral body. Interval decrease in vertebral body height is seen since the prior study. CT ABDOMEN PELVIS FINDINGS Hepatobiliary: No focal liver abnormality is seen. No gallstones, gallbladder wall thickening, or biliary dilatation. Pancreas: Unremarkable. No pancreatic ductal dilatation or surrounding inflammatory changes. Spleen: Normal in size without focal abnormality. Adrenals/Urinary Tract: Adrenal glands are unremarkable. The left kidney is atrophic in appearance. Compensatory hypertrophy of the right kidney is seen. Subcentimeter cysts are noted within the upper pole of the left kidney. There is no evidence of renal calculi or hydronephrosis. The urinary bladder is moderately distended and  otherwise normal in appearance. Stomach/Bowel: Stomach is within normal limits. The appendix is not clearly identified. No evidence of bowel wall thickening, distention, or inflammatory changes. Vascular/Lymphatic: There is marked severity calcification and tortuosity of the abdominal aorta and bilateral common iliac arteries, without evidence of aneurysmal dilatation. A 1.9 cm x 1.5 cm lymph node is suspected along the medial aspect of the mid to lower right pelvis. A similar appearing 2.1 cm x 1.4 cm area is noted within this region on the left. Reproductive: Status post hysterectomy. No adnexal masses. Other: No abdominal wall hernia or abnormality. No abdominopelvic ascites. Musculoskeletal: Acute fracture deformity is seen involving the greater trochanter of the proximal left femur. Marked severity degenerative changes seen at the level of L2-L3. IMPRESSION: 1. Acute lateral third left rib fracture with a stable lateral fourth left rib fracture. 2. Acute fracture of the greater trochanter of the proximal left femur. 3. Stable noncalcified lung nodules within the bilateral lungs, as described above with stable posterior left upper lobe scarring and/or atelectasis. 4. 6 mm pleural based noncalcified left lower lobe lung nodule which represents a new finding when compared to the prior exam. Three-month follow-up chest CT is recommended to determine stability. 5. Moderate severity emphysematous lung disease. 6. Mild pleural thickening along the posterior aspect of the left lung base. This represents a new finding when compared to the prior study. Correlation with follow-up chest CT is again recommended to determine stability and exclude the presence of an underlying neoplasm. 7. Chronic compression fracture deformity involving the T3 vertebral body with interval decrease in vertebral body height since the prior study. Aortic Atherosclerosis (ICD10-I70.0) and Emphysema (ICD10-J43.9). Electronically Signed   By:  Virgina Norfolk M.D.   On: 04/08/2020 17:05   CT Cervical Spine Wo Contrast  Result Date: 04/08/2020 CLINICAL DATA:  Fall with LEFT hip pain EXAM: CT HEAD WITHOUT CONTRAST CT CERVICAL SPINE WITHOUT CONTRAST TECHNIQUE: Multidetector CT imaging of the head and cervical spine was performed following the standard protocol without intravenous contrast. Multiplanar CT image reconstructions of the cervical spine were also generated. COMPARISON:  September 05, 2019. FINDINGS: CT HEAD FINDINGS Brain: No evidence of acute  infarction, hemorrhage, hydrocephalus, extra-axial collection or mass lesion/mass effect. Periventricular white matter hypodensities consistent with sequela of chronic microvascular ischemic disease. Mildly advanced global volume loss for age. Vascular: Vascular calcifications of bilateral carotid siphons and vertebral arteries. Skull: No acute skull fracture.  Osteopenia. Sinuses/Orbits: Mucosal thickening of the RIGHT greater than LEFT maxillary sinus. Mucosal thickening of the RIGHT sphenoid sinus. Other: RIGHT ear cerumen. CT CERVICAL SPINE FINDINGS Alignment: Exaggerated lordosis, unchanged. No spondylolisthesis or traumatic subluxation. Skull base and vertebrae: No acute fracture. No primary bone lesion or focal pathologic process. Incomplete fusion of the posterior arch of C1. Osteopenia. Soft tissues and spinal canal: No prevertebral fluid or swelling. No visible canal hematoma. Disc levels: Intervertebral disc space height loss at C4-5. Multilevel facet arthropathy. Upper chest: Revisualization of an irregular RIGHT upper lobe pulmonary nodule measuring approximately 8 by 11 mm, similar in comparison to prior. Adjacent fiducial marker. Paraseptal emphysema. Other: Surgical clips in the RIGHT neck status post endarterectomy. Severe LEFT carotid atherosclerotic calcifications. IMPRESSION: 1.  No acute intracranial abnormality. 2.  No acute fracture or static subluxation of the cervical spine.  Emphysema (ICD10-J43.9). Electronically Signed   By: Valentino Saxon MD   On: 04/08/2020 14:04   CT ABDOMEN PELVIS W CONTRAST  Result Date: 04/08/2020 CLINICAL DATA:  Status post trauma. EXAM: CT CHEST, ABDOMEN, AND PELVIS WITH CONTRAST TECHNIQUE: Multidetector CT imaging of the chest, abdomen and pelvis was performed following the standard protocol during bolus administration of intravenous contrast. CONTRAST:  129mL OMNIPAQUE IOHEXOL 300 MG/ML  SOLN COMPARISON:  None. FINDINGS: CT CHEST FINDINGS Cardiovascular: There is marked severity calcification of the thoracic aorta and great vessels. Normal heart size with marked severity coronary artery calcification. No pericardial effusion. Mediastinum/Nodes: No enlarged mediastinal, hilar, or axillary lymph nodes. Thyroid gland, trachea, and esophagus demonstrate no significant findings. Lungs/Pleura: There is moderate severity emphysematous lung disease. A stable 1.0 cm x 0.7 cm noncalcified lung nodule is seen within the posteromedial aspect of the right apex. Small metallic density surgical material is also seen within this region. Stable moderate severity scarring and/or atelectasis is seen within the posterior aspect of the left upper lobe. A stable 1.6 cm x 0.7 cm noncalcified nodular component is seen (axial CT image 37, CT series number 5). Small metallic density surgical material is also seen within this area. A stable 2 mm posteromedial left lower lobe noncalcified lung nodule is seen (axial CT image 91, CT series number 5). A 0.6 cm pleural based noncalcified lung nodule is seen within the posterior aspect of the left lower lobe (axial CT image 102, CT series number 5). This represents a new finding when compared to the prior exam. A stable 0.9 cm x 0.6 cm mildly lobulated noncalcified lung nodule is seen within the posterolateral aspect of the right lower lobe (axial CT image 103, CT series number 5). Stable mild pleural thickening is seen along the  posterior aspect of the left lung base. This represents a new finding when compared to the prior study. Musculoskeletal: An acute lateral third left rib fracture is seen. A stable lateral fourth left rib fracture is noted. A chronic compression fracture deformity is seen involving the T3 vertebral body. Interval decrease in vertebral body height is seen since the prior study. CT ABDOMEN PELVIS FINDINGS Hepatobiliary: No focal liver abnormality is seen. No gallstones, gallbladder wall thickening, or biliary dilatation. Pancreas: Unremarkable. No pancreatic ductal dilatation or surrounding inflammatory changes. Spleen: Normal in size without focal abnormality. Adrenals/Urinary Tract: Adrenal  glands are unremarkable. The left kidney is atrophic in appearance. Compensatory hypertrophy of the right kidney is seen. Subcentimeter cysts are noted within the upper pole of the left kidney. There is no evidence of renal calculi or hydronephrosis. The urinary bladder is moderately distended and otherwise normal in appearance. Stomach/Bowel: Stomach is within normal limits. The appendix is not clearly identified. No evidence of bowel wall thickening, distention, or inflammatory changes. Vascular/Lymphatic: There is marked severity calcification and tortuosity of the abdominal aorta and bilateral common iliac arteries, without evidence of aneurysmal dilatation. A 1.9 cm x 1.5 cm lymph node is suspected along the medial aspect of the mid to lower right pelvis. A similar appearing 2.1 cm x 1.4 cm area is noted within this region on the left. Reproductive: Status post hysterectomy. No adnexal masses. Other: No abdominal wall hernia or abnormality. No abdominopelvic ascites. Musculoskeletal: Acute fracture deformity is seen involving the greater trochanter of the proximal left femur. Marked severity degenerative changes seen at the level of L2-L3. IMPRESSION: 1. Acute lateral third left rib fracture with a stable lateral fourth left  rib fracture. 2. Acute fracture of the greater trochanter of the proximal left femur. 3. Stable noncalcified lung nodules within the bilateral lungs, as described above with stable posterior left upper lobe scarring and/or atelectasis. 4. 6 mm pleural based noncalcified left lower lobe lung nodule which represents a new finding when compared to the prior exam. Three-month follow-up chest CT is recommended to determine stability. 5. Moderate severity emphysematous lung disease. 6. Mild pleural thickening along the posterior aspect of the left lung base. This represents a new finding when compared to the prior study. Correlation with follow-up chest CT is again recommended to determine stability and exclude the presence of an underlying neoplasm. 7. Chronic compression fracture deformity involving the T3 vertebral body with interval decrease in vertebral body height since the prior study. Aortic Atherosclerosis (ICD10-I70.0) and Emphysema (ICD10-J43.9). Electronically Signed   By: Virgina Norfolk M.D.   On: 04/08/2020 17:05   DG Shoulder Left  Result Date: 04/08/2020 CLINICAL DATA:  Fall EXAM: LEFT SHOULDER - 2+ VIEW COMPARISON:  June 28, 2019 FINDINGS: Osteopenia. No acute fracture or dislocation of the shoulder. There is a mildly displaced fracture of the LEFT third and likely fourth rib. Surgical clips project over the RIGHT neck and LEFT thorax. No pneumothorax. No unexpected radiopaque foreign body. Vascular calcifications. IMPRESSION: 1. No acute fracture or dislocation of the LEFT shoulder. 2. Mildly displaced fracture of the LEFT third and likely fourth rib. Electronically Signed   By: Valentino Saxon MD   On: 04/08/2020 14:57   CT NO CHARGE  Result Date: 04/08/2020 CLINICAL DATA:  Status post trauma. EXAM: CT OF THE LEFT HIP WITHOUT CONTRAST TECHNIQUE: Multidetector CT imaging of the left hip was performed according to the standard protocol. Multiplanar CT image reconstructions were also  generated. COMPARISON:  None. FINDINGS: Bones/Joint/Cartilage An acute comminuted fracture deformity is seen involving the greater trochanter of the proximal left femur. Mild extension to involve the adjacent inter trochanteric portion of the proximal left femur is seen. There is no evidence of dislocation. Ligaments Suboptimally assessed by CT. Muscles and Tendons Muscles and tendons are unremarkable. Soft tissues Mild soft tissue swelling is seen surrounding the previously noted fracture site with a mild amount of subcutaneous inflammatory fat stranding seen along the lateral aspect of the left hip. IMPRESSION: Acute comminuted fracture deformity of the greater trochanter of the proximal left femur. Electronically Signed  By: Aram Candela M.D.   On: 04/08/2020 17:14   DG FEMUR MIN 2 VIEWS LEFT  Result Date: 04/08/2020 CLINICAL DATA:  Fall EXAM: PELVIS - 1-2 VIEW; LEFT FEMUR 2 VIEWS COMPARISON:  None. FINDINGS: There is a comminuted of LEFT greater trochanteric fracture. There is minimal displacement of the tip of the greater trochanter. No definite extension through the femoral neck but evaluation is limited secondary to osteopenia. Vascular calcifications. No pelvic diastasis. Mild degenerative changes of the lower lumbar spine. Limited assessment of the contralateral hip is unremarkable IMPRESSION: Comminuted of the LEFT greater trochanteric fracture with minimal displacement of the tip of the greater trochanter. No definite extension through the femoral neck but evaluation is limited secondary to osteopenia. Electronically Signed   By: Meda Klinefelter MD   On: 04/08/2020 14:55   Family History Reviewed and non-contributory, no pertinent history of problems with bleeding or anesthesia      Review of Systems 14 system ROS conducted and negative except for that noted in HPI   OBJECTIVE  Vitals: Patient Vitals for the past 8 hrs:  BP Pulse Resp SpO2  04/09/20 0919 (!) 142/56 85 -- --   04/09/20 0800 (!) 171/65 70 20 100 %  04/09/20 0700 (!) 158/55 63 12 100 %  04/09/20 0300 (!) 172/65 76 13 99 %   General: Alert, no acute distress Cardiovascular: Warm extremities noted Respiratory: No cyanosis, no use of accessory musculature GI: No organomegaly, abdomen is soft and non-tender Skin: No lesions in the area of chief complaint other than those listed below in MSK exam.  Neurologic: Sensation intact distally save for the below mentioned MSK exam Psychiatric: Patient is competent for consent with normal mood and affect Lymphatic: No swelling obvious and reported other than the area involved in the exam below  Extremities  Bilateral upper extremities: actively moves upper extremities without pain. Non-tender to palpation throughout. NVI.  RLE: Non-tender to palpation right hip, knee, ankle, foot. Actively moves RLE without pain. NVI LLE: Tender to palpation left greater trochanter. Non-tender left knee, ankle, foot. Tolerates gentle ROM of left hip with some reported pain. ROM of left knee 0 - 90 degrees. No swelling, deformity, or effusion. Skin intact. + GS/TA/EHL. Sensation intact in DP/SP/S/S/P distributions. 2+ DP pulse with warm and well perfused digits. Compartments soft and compressible, with no pain on passive stretch.  Test Results Imaging  Left tibia/fibula and left shoulder x-rays showing no acute bony abnormalities Pelvis xrays showing left greater trochanteric fracture with minimal displacement of the tip of the greater trochanter CT chest/abdomen/pelvis demonstrates Acute lateral third left rib fracture with a stable lateral fourth left rib fracture. Chronic compression fracture deformity involving the T3 vertebral body. Acute fracture of the greater trochanter of the proximal left femur.   Labs cbc Recent Labs    04/08/20 1325  WBC 11.9*  HGB 9.4*  HCT 29.6*  PLT 211    Labs inflam No results for input(s): CRP in the last 72 hours.  Invalid  input(s): ESR  Labs coag No results for input(s): INR, PTT in the last 72 hours.  Invalid input(s): PT  Recent Labs    04/08/20 1325  NA 137  K 3.8  CL 100  CO2 26  GLUCOSE 98  BUN 13  CREATININE 0.94  CALCIUM 9.2     ASSESSMENT AND PLAN: 75 y.o. female with the following: Left greater trochanteric femur fracture, left sided rib fracture  This patient has been admitted to the  medical service for PT/OT evaluations and possible SNF placement. Patient was unable to weight bear due to pain. According to chart review, she lives at home alone. She ambulates with a walker  - Plan: non-operative management: Left greater trochanteric femur fracture and left sided rib fracture - Weight Bearing Status/Activity: WBAT on LLE - Recommendations: Symptomatic treatment. Encourage deep breathing for rib fracture, incentive spirometry, pain management. Ice. PT/OT/placement if having difficulty weight bearing and/or if not safe for her to return home.  - VTE Prophylaxis: per medicine - Pain control: PRN pain medications, preferring oral medications - Follow-up plan: 1 month in office for repeat imaging  -Procedures: none  Orthopedics will be available as needed for any additional questions/concerns/  Noemi Chapel, PA-C 04/09/2020

## 2020-04-09 NOTE — ED Notes (Signed)
Drema Balzarine [phone] would like an update.

## 2020-04-09 NOTE — Progress Notes (Signed)
PROGRESS NOTE    Kendra Watts  UJW:[id number] DOB: 04-25-1946 DOA: 04/08/2020 PCP: Merrilee Seashore, MD   Brief Narrative: 74 years old female with medical history significant for breast cancer, NSCLC status post radiation, in remission /very small tumor burden followed closely by Dr. Sondra Come, COPD on chronic home oxygen 4 L.  Presents in the emergency department status post mechanical fall with left femur fracture, left hip fracture.  Ortho consulted recommended nonoperative management. She was recently admitted last month with acute blood loss anemia due to recurrent nosebleeds,  required transfusion.  She was discharged to skilled nursing facility and  discharged home on 9/16.  She had a mechanical fall this morning.   Assessment & Plan:   Principal Problem:   Fracture of greater trochanter of left femur (HCC) Active Problems:   Essential hypertension   Chronic respiratory failure with hypoxia (HCC)   Multiple lung nodules   Chronic obstructive pulmonary disease (HCC)   Squamous cell lung cancer, left (HCC)   Adenocarcinoma, lung, right (HCC)   Fracture of one rib, left side, initial encounter for closed fracture   Closed fracture of greater trochanter of left femur (HCC)   Mechanical fall with Fx of greater troch of L femur Ortho consulted , recommended non operative management. PT/OT eval and treat. SW eval and probably SNF vs CIR placement. Pain control: tylenol, ibuprofen, and norco PRN.  Left 3rd rib fx - conservative management. Incentive spirometry. Pain ctrl  HTN - Cont home BP meds.  COPD - Not in exacerbation: Cont home nebs, Cont O2 via   Multiple lung nodules, h/o multiple NSCLCs - mostly stable findings Though has new 0.50mm pulm nodule and 2x ~2cm lymph nodes in pelvis Dr. Kevan Ny sent message on Epic to Dr. Sondra Come for review.   DVT prophylaxis: Lovenox. Code Status: Full Family Communication:  No family at bed side. Disposition Plan:   Status  is: Inpatient  Remains inpatient appropriate because:Inpatient level of care appropriate due to severity of illness   Dispo: The patient is from: Home              Anticipated d/c is to: SNF              Anticipated d/c date is: 3 days              Patient currently is not medically stable to d/c.  Consultants:   orthopeadics  Procedures: None. Antimicrobials:  Anti-infectives (From admission, onward)   None      Subjective: Patient was seen and examined at bedside.  Overnight events noted.   She reports pain is better,  it hurts when she moves her left hip, she denies any chest pain or shortness of breath.  Objective: Vitals:   04/09/20 0800 04/09/20 0919 04/09/20 1200 04/09/20 1300  BP: (!) 171/65 (!) 142/56 (!) 144/55 (!) 150/123  Pulse: 70 85 69 71  Resp: 20  (!) 22 20  Temp:      TempSrc:      SpO2: 100%  100% 99%  Weight:      Height:       No intake or output data in the 24 hours ending 04/09/20 1550 Filed Weights   04/08/20 1250  Weight: 40 kg    Examination:  General exam: Appears calm and comfortable. Alert , oriented x 2. Respiratory system: Clear to auscultation. Respiratory effort normal. Left sided tenderness noted in chest wall. Cardiovascular system: S1 & S2 heard, RRR. No JVD, murmurs,  rubs, gallops or clicks. No pedal edema. Gastrointestinal system: Abdomen is nondistended, soft and nontender. No organomegaly or masses felt. Normal bowel sounds heard. Central nervous system: Alert and oriented. No focal neurological deficits. Extremities:  Left hip tenderness+ Skin: No rashes, lesions or ulcers Psychiatry: Judgement and insight appear normal. Mood & affect appropriate.     Data Reviewed: I have personally reviewed following labs and imaging studies  CBC: Recent Labs  Lab 04/08/20 1325  WBC 11.9*  NEUTROABS 10.0*  HGB 9.4*  HCT 29.6*  MCV 88.6  PLT 366   Basic Metabolic Panel: Recent Labs  Lab 04/08/20 1325  NA 137  K 3.8  CL  100  CO2 26  GLUCOSE 98  BUN 13  CREATININE 0.94  CALCIUM 9.2   GFR: Estimated Creatinine Clearance: 33.7 mL/min (by C-G formula based on SCr of 0.94 mg/dL). Liver Function Tests: No results for input(s): AST, ALT, ALKPHOS, BILITOT, PROT, ALBUMIN in the last 168 hours. No results for input(s): LIPASE, AMYLASE in the last 168 hours. No results for input(s): AMMONIA in the last 168 hours. Coagulation Profile: No results for input(s): INR, PROTIME in the last 168 hours. Cardiac Enzymes: No results for input(s): CKTOTAL, CKMB, CKMBINDEX, TROPONINI in the last 168 hours. BNP (last 3 results) No results for input(s): PROBNP in the last 8760 hours. HbA1C: No results for input(s): HGBA1C in the last 72 hours. CBG: No results for input(s): GLUCAP in the last 168 hours. Lipid Profile: No results for input(s): CHOL, HDL, LDLCALC, TRIG, CHOLHDL, LDLDIRECT in the last 72 hours. Thyroid Function Tests: No results for input(s): TSH, T4TOTAL, FREET4, T3FREE, THYROIDAB in the last 72 hours. Anemia Panel: No results for input(s): VITAMINB12, FOLATE, FERRITIN, TIBC, IRON, RETICCTPCT in the last 72 hours. Sepsis Labs: No results for input(s): PROCALCITON, LATICACIDVEN in the last 168 hours.  Recent Results (from the past 240 hour(s))  SARS Coronavirus 2 by RT PCR (hospital order, performed in [facility] hospital lab) Nasopharyngeal Nasopharyngeal Swab     Status: None   Collection Time: 04/08/20  3:05 PM   Specimen: Nasopharyngeal Swab  Result Value Ref Range Status   SARS Coronavirus 2 NEGATIVE NEGATIVE Final    Comment: (NOTE) SARS-CoV-2 target nucleic acids are NOT DETECTED.  The SARS-CoV-2 RNA is generally detectable in upper and lower respiratory specimens during the acute phase of infection. The lowest concentration of SARS-CoV-2 viral copies this assay can detect is 250 copies / mL. A negative result does not preclude SARS-CoV-2 infection and should not be used as the sole basis for  treatment or other patient management decisions.  A negative result may occur with improper specimen collection / handling, submission of specimen other than nasopharyngeal swab, presence of viral mutation(s) within the areas targeted by this assay, and inadequate number of viral copies (<250 copies / mL). A negative result must be combined with clinical observations, patient history, and epidemiological information.  Fact Sheet for Patients:   StrictlyIdeas.no  Fact Sheet for Healthcare Providers: BankingDealers.co.za  This test is not yet approved or  cleared by the [place] FDA and has been authorized for detection and/or diagnosis of SARS-CoV-2 by FDA under an Emergency Use Authorization (EUA).  This EUA will remain in effect (meaning this test can be used) for the duration of the COVID-19 declaration under Section 564(b)(1) of the Act, 21 U.S.C. section 360bbb-3(b)(1), unless the authorization is terminated or revoked sooner.  Performed at [facility], [place] [address], [place], [place]  [address]          Radiology Studies: DG Chest 2 View  Result Date: 04/08/2020 CLINICAL DATA:  Fall EXAM: CHEST - 2 VIEW COMPARISON:  March 13, 2020 FINDINGS: The cardiomediastinal silhouette is unchanged in contour.Surgical clips project over the RIGHT neck and LEFT thorax. Vascular calcifications. Tortuous thoracic aorta. No pleural effusion. No pneumothorax. No acute pleuroparenchymal abnormality. Visualized abdomen is unremarkable. Mildly displaced LEFT lateral third and fourth rib fractures. IMPRESSION: Mildly displaced LEFT third and fourth rib fractures. No pneumothorax. Electronically Signed   By: Valentino Saxon MD   On: 04/08/2020 14:59   DG Lumbar Spine Complete  Result Date: 04/08/2020 CLINICAL DATA:  Fall EXAM: LUMBAR SPINE - COMPLETE 4+ VIEW COMPARISON:  None. FINDINGS: There are five non-rib bearing lumbar-type  vertebral bodies. No spondylolisthesis. There is levocurvature of the mid lumbar spine. There is no evidence for acute fracture or subluxation. Intervertebral disc space height loss is moderate at L2-3 with multilevel endplate proliferative changes. Mild lower lumbar facet arthropathy. Vascular calcifications. Renal artery stents. IMPRESSION: 1. No acute fracture or subluxation of the lumbar spine. 2. Multilevel degenerative disc disease, most pronounced at L2-3. Electronically Signed   By: Valentino Saxon MD   On: 04/08/2020 15:02   DG Pelvis 1-2 Views  Result Date: 04/08/2020 CLINICAL DATA:  Fall EXAM: PELVIS - 1-2 VIEW; LEFT FEMUR 2 VIEWS COMPARISON:  None. FINDINGS: There is a comminuted of LEFT greater trochanteric fracture. There is minimal displacement of the tip of the greater trochanter. No definite extension through the femoral neck but evaluation is limited secondary to osteopenia. Vascular calcifications. No pelvic diastasis. Mild degenerative changes of the lower lumbar spine. Limited assessment of the contralateral hip is unremarkable IMPRESSION: Comminuted of the LEFT greater trochanteric fracture with minimal displacement of the tip of the greater trochanter. No definite extension through the femoral neck but evaluation is limited secondary to osteopenia. Electronically Signed   By: Valentino Saxon MD   On: 04/08/2020 14:55   DG Tibia/Fibula Left  Result Date: 04/08/2020 CLINICAL DATA:  Fall EXAM: LEFT TIBIA AND FIBULA - 2 VIEW COMPARISON:  None. FINDINGS: Osteopenia. No acute fracture or dislocation. Joint spaces and alignment are maintained. No area of erosion or osseous destruction. No unexpected radiopaque foreign body. Vascular calcifications. IMPRESSION: 1. No acute osseous abnormality in the left tibia or fibula. Electronically Signed   By: Valentino Saxon MD   On: 04/08/2020 15:04   CT Head Wo Contrast  Result Date: 04/08/2020 CLINICAL DATA:  Fall with LEFT hip pain EXAM:  CT HEAD WITHOUT CONTRAST CT CERVICAL SPINE WITHOUT CONTRAST TECHNIQUE: Multidetector CT imaging of the head and cervical spine was performed following the standard protocol without intravenous contrast. Multiplanar CT image reconstructions of the cervical spine were also generated. COMPARISON:  September 05, 2019. FINDINGS: CT HEAD FINDINGS Brain: No evidence of acute infarction, hemorrhage, hydrocephalus, extra-axial collection or mass lesion/mass effect. Periventricular white matter hypodensities consistent with sequela of chronic microvascular ischemic disease. Mildly advanced global volume loss for age. Vascular: Vascular calcifications of bilateral carotid siphons and vertebral arteries. Skull: No acute skull fracture.  Osteopenia. Sinuses/Orbits: Mucosal thickening of the RIGHT greater than LEFT maxillary sinus. Mucosal thickening of the RIGHT sphenoid sinus. Other: RIGHT ear cerumen. CT CERVICAL SPINE FINDINGS Alignment: Exaggerated lordosis, unchanged. No spondylolisthesis or traumatic subluxation. Skull base and vertebrae: No acute fracture. No primary bone lesion or focal pathologic process. Incomplete fusion of the posterior arch of C1. Osteopenia. Soft tissues and spinal  canal: No prevertebral fluid or swelling. No visible canal hematoma. Disc levels: Intervertebral disc space height loss at C4-5. Multilevel facet arthropathy. Upper chest: Revisualization of an irregular RIGHT upper lobe pulmonary nodule measuring approximately 8 by 11 mm, similar in comparison to prior. Adjacent fiducial marker. Paraseptal emphysema. Other: Surgical clips in the RIGHT neck status post endarterectomy. Severe LEFT carotid atherosclerotic calcifications. IMPRESSION: 1.  No acute intracranial abnormality. 2.  No acute fracture or static subluxation of the cervical spine. Emphysema (ICD10-J43.9). Electronically Signed   By: Valentino Saxon MD   On: 04/08/2020 14:04   CT Chest W Contrast  Result Date:  04/08/2020 CLINICAL DATA:  Status post trauma. EXAM: CT CHEST, ABDOMEN, AND PELVIS WITH CONTRAST TECHNIQUE: Multidetector CT imaging of the chest, abdomen and pelvis was performed following the standard protocol during bolus administration of intravenous contrast. CONTRAST:  163mL OMNIPAQUE IOHEXOL 300 MG/ML  SOLN COMPARISON:  None. November 15, 2019 FINDINGS: CT CHEST FINDINGS Cardiovascular: There is marked severity calcification of the thoracic aorta and great vessels. Normal heart size with marked severity coronary artery calcification. No pericardial effusion. Mediastinum/Nodes: No enlarged mediastinal, hilar, or axillary lymph nodes. Thyroid gland, trachea, and esophagus demonstrate no significant findings. Lungs/Pleura: There is moderate severity emphysematous lung disease. A stable 1.0 cm x 0.7 cm noncalcified lung nodule is seen within the posteromedial aspect of the right apex. Small metallic density surgical material is also seen within this region. Stable moderate severity scarring and/or atelectasis is seen within the posterior aspect of the left upper lobe. A stable 1.6 cm x 0.7 cm noncalcified nodular component is seen (axial CT image 37, CT series number 5). Small metallic density surgical material is also seen within this area. A stable 2 mm posteromedial left lower lobe noncalcified lung nodule is seen (axial CT image 91, CT series number 5). A 0.6 cm pleural based noncalcified lung nodule is seen within the posterior aspect of the left lower lobe (axial CT image 102, CT series number 5). This represents a new finding when compared to the prior exam. A stable 0.9 cm x 0.6 cm mildly lobulated noncalcified lung nodule is seen within the posterolateral aspect of the right lower lobe (axial CT image 103, CT series number 5). Stable mild pleural thickening is seen along the posterior aspect of the left lung base. This represents a new finding when compared to the prior study. Musculoskeletal: An acute  lateral third left rib fracture is seen. A stable lateral fourth left rib fracture is noted. A chronic compression fracture deformity is seen involving the T3 vertebral body. Interval decrease in vertebral body height is seen since the prior study. CT ABDOMEN PELVIS FINDINGS Hepatobiliary: No focal liver abnormality is seen. No gallstones, gallbladder wall thickening, or biliary dilatation. Pancreas: Unremarkable. No pancreatic ductal dilatation or surrounding inflammatory changes. Spleen: Normal in size without focal abnormality. Adrenals/Urinary Tract: Adrenal glands are unremarkable. The left kidney is atrophic in appearance. Compensatory hypertrophy of the right kidney is seen. Subcentimeter cysts are noted within the upper pole of the left kidney. There is no evidence of renal calculi or hydronephrosis. The urinary bladder is moderately distended and otherwise normal in appearance. Stomach/Bowel: Stomach is within normal limits. The appendix is not clearly identified. No evidence of bowel wall thickening, distention, or inflammatory changes. Vascular/Lymphatic: There is marked severity calcification and tortuosity of the abdominal aorta and bilateral common iliac arteries, without evidence of aneurysmal dilatation. A 1.9 cm x 1.5 cm lymph node is suspected along the  medial aspect of the mid to lower right pelvis. A similar appearing 2.1 cm x 1.4 cm area is noted within this region on the left. Reproductive: Status post hysterectomy. No adnexal masses. Other: No abdominal wall hernia or abnormality. No abdominopelvic ascites. Musculoskeletal: Acute fracture deformity is seen involving the greater trochanter of the proximal left femur. Marked severity degenerative changes seen at the level of L2-L3. IMPRESSION: 1. Acute lateral third left rib fracture with a stable lateral fourth left rib fracture. 2. Acute fracture of the greater trochanter of the proximal left femur. 3. Stable noncalcified lung nodules within  the bilateral lungs, as described above with stable posterior left upper lobe scarring and/or atelectasis. 4. 6 mm pleural based noncalcified left lower lobe lung nodule which represents a new finding when compared to the prior exam. Three-month follow-up chest CT is recommended to determine stability. 5. Moderate severity emphysematous lung disease. 6. Mild pleural thickening along the posterior aspect of the left lung base. This represents a new finding when compared to the prior study. Correlation with follow-up chest CT is again recommended to determine stability and exclude the presence of an underlying neoplasm. 7. Chronic compression fracture deformity involving the T3 vertebral body with interval decrease in vertebral body height since the prior study. Aortic Atherosclerosis (ICD10-I70.0) and Emphysema (ICD10-J43.9). Electronically Signed   By: Virgina Norfolk M.D.   On: 04/08/2020 17:05   CT Cervical Spine Wo Contrast  Result Date: 04/08/2020 CLINICAL DATA:  Fall with LEFT hip pain EXAM: CT HEAD WITHOUT CONTRAST CT CERVICAL SPINE WITHOUT CONTRAST TECHNIQUE: Multidetector CT imaging of the head and cervical spine was performed following the standard protocol without intravenous contrast. Multiplanar CT image reconstructions of the cervical spine were also generated. COMPARISON:  September 05, 2019. FINDINGS: CT HEAD FINDINGS Brain: No evidence of acute infarction, hemorrhage, hydrocephalus, extra-axial collection or mass lesion/mass effect. Periventricular white matter hypodensities consistent with sequela of chronic microvascular ischemic disease. Mildly advanced global volume loss for age. Vascular: Vascular calcifications of bilateral carotid siphons and vertebral arteries. Skull: No acute skull fracture.  Osteopenia. Sinuses/Orbits: Mucosal thickening of the RIGHT greater than LEFT maxillary sinus. Mucosal thickening of the RIGHT sphenoid sinus. Other: RIGHT ear cerumen. CT CERVICAL SPINE FINDINGS  Alignment: Exaggerated lordosis, unchanged. No spondylolisthesis or traumatic subluxation. Skull base and vertebrae: No acute fracture. No primary bone lesion or focal pathologic process. Incomplete fusion of the posterior arch of C1. Osteopenia. Soft tissues and spinal canal: No prevertebral fluid or swelling. No visible canal hematoma. Disc levels: Intervertebral disc space height loss at C4-5. Multilevel facet arthropathy. Upper chest: Revisualization of an irregular RIGHT upper lobe pulmonary nodule measuring approximately 8 by 11 mm, similar in comparison to prior. Adjacent fiducial marker. Paraseptal emphysema. Other: Surgical clips in the RIGHT neck status post endarterectomy. Severe LEFT carotid atherosclerotic calcifications. IMPRESSION: 1.  No acute intracranial abnormality. 2.  No acute fracture or static subluxation of the cervical spine. Emphysema (ICD10-J43.9). Electronically Signed   By: Valentino Saxon MD   On: 04/08/2020 14:04   CT ABDOMEN PELVIS W CONTRAST  Result Date: 04/08/2020 CLINICAL DATA:  Status post trauma. EXAM: CT CHEST, ABDOMEN, AND PELVIS WITH CONTRAST TECHNIQUE: Multidetector CT imaging of the chest, abdomen and pelvis was performed following the standard protocol during bolus administration of intravenous contrast. CONTRAST:  153mL OMNIPAQUE IOHEXOL 300 MG/ML  SOLN COMPARISON:  None. FINDINGS: CT CHEST FINDINGS Cardiovascular: There is marked severity calcification of the thoracic aorta and great vessels. Normal heart size with  marked severity coronary artery calcification. No pericardial effusion. Mediastinum/Nodes: No enlarged mediastinal, hilar, or axillary lymph nodes. Thyroid gland, trachea, and esophagus demonstrate no significant findings. Lungs/Pleura: There is moderate severity emphysematous lung disease. A stable 1.0 cm x 0.7 cm noncalcified lung nodule is seen within the posteromedial aspect of the right apex. Small metallic density surgical material is also seen  within this region. Stable moderate severity scarring and/or atelectasis is seen within the posterior aspect of the left upper lobe. A stable 1.6 cm x 0.7 cm noncalcified nodular component is seen (axial CT image 37, CT series number 5). Small metallic density surgical material is also seen within this area. A stable 2 mm posteromedial left lower lobe noncalcified lung nodule is seen (axial CT image 91, CT series number 5). A 0.6 cm pleural based noncalcified lung nodule is seen within the posterior aspect of the left lower lobe (axial CT image 102, CT series number 5). This represents a new finding when compared to the prior exam. A stable 0.9 cm x 0.6 cm mildly lobulated noncalcified lung nodule is seen within the posterolateral aspect of the right lower lobe (axial CT image 103, CT series number 5). Stable mild pleural thickening is seen along the posterior aspect of the left lung base. This represents a new finding when compared to the prior study. Musculoskeletal: An acute lateral third left rib fracture is seen. A stable lateral fourth left rib fracture is noted. A chronic compression fracture deformity is seen involving the T3 vertebral body. Interval decrease in vertebral body height is seen since the prior study. CT ABDOMEN PELVIS FINDINGS Hepatobiliary: No focal liver abnormality is seen. No gallstones, gallbladder wall thickening, or biliary dilatation. Pancreas: Unremarkable. No pancreatic ductal dilatation or surrounding inflammatory changes. Spleen: Normal in size without focal abnormality. Adrenals/Urinary Tract: Adrenal glands are unremarkable. The left kidney is atrophic in appearance. Compensatory hypertrophy of the right kidney is seen. Subcentimeter cysts are noted within the upper pole of the left kidney. There is no evidence of renal calculi or hydronephrosis. The urinary bladder is moderately distended and otherwise normal in appearance. Stomach/Bowel: Stomach is within normal limits. The  appendix is not clearly identified. No evidence of bowel wall thickening, distention, or inflammatory changes. Vascular/Lymphatic: There is marked severity calcification and tortuosity of the abdominal aorta and bilateral common iliac arteries, without evidence of aneurysmal dilatation. A 1.9 cm x 1.5 cm lymph node is suspected along the medial aspect of the mid to lower right pelvis. A similar appearing 2.1 cm x 1.4 cm area is noted within this region on the left. Reproductive: Status post hysterectomy. No adnexal masses. Other: No abdominal wall hernia or abnormality. No abdominopelvic ascites. Musculoskeletal: Acute fracture deformity is seen involving the greater trochanter of the proximal left femur. Marked severity degenerative changes seen at the level of L2-L3. IMPRESSION: 1. Acute lateral third left rib fracture with a stable lateral fourth left rib fracture. 2. Acute fracture of the greater trochanter of the proximal left femur. 3. Stable noncalcified lung nodules within the bilateral lungs, as described above with stable posterior left upper lobe scarring and/or atelectasis. 4. 6 mm pleural based noncalcified left lower lobe lung nodule which represents a new finding when compared to the prior exam. Three-month follow-up chest CT is recommended to determine stability. 5. Moderate severity emphysematous lung disease. 6. Mild pleural thickening along the posterior aspect of the left lung base. This represents a new finding when compared to the prior study. Correlation with follow-up  chest CT is again recommended to determine stability and exclude the presence of an underlying neoplasm. 7. Chronic compression fracture deformity involving the T3 vertebral body with interval decrease in vertebral body height since the prior study. Aortic Atherosclerosis (ICD10-I70.0) and Emphysema (ICD10-J43.9). Electronically Signed   By: Virgina Norfolk M.D.   On: 04/08/2020 17:05   DG Shoulder Left  Result Date:  04/08/2020 CLINICAL DATA:  Fall EXAM: LEFT SHOULDER - 2+ VIEW COMPARISON:  June 28, 2019 FINDINGS: Osteopenia. No acute fracture or dislocation of the shoulder. There is a mildly displaced fracture of the LEFT third and likely fourth rib. Surgical clips project over the RIGHT neck and LEFT thorax. No pneumothorax. No unexpected radiopaque foreign body. Vascular calcifications. IMPRESSION: 1. No acute fracture or dislocation of the LEFT shoulder. 2. Mildly displaced fracture of the LEFT third and likely fourth rib. Electronically Signed   By: Valentino Saxon MD   On: 04/08/2020 14:57   CT NO CHARGE  Result Date: 04/08/2020 CLINICAL DATA:  Status post trauma. EXAM: CT OF THE LEFT HIP WITHOUT CONTRAST TECHNIQUE: Multidetector CT imaging of the left hip was performed according to the standard protocol. Multiplanar CT image reconstructions were also generated. COMPARISON:  None. FINDINGS: Bones/Joint/Cartilage An acute comminuted fracture deformity is seen involving the greater trochanter of the proximal left femur. Mild extension to involve the adjacent inter trochanteric portion of the proximal left femur is seen. There is no evidence of dislocation. Ligaments Suboptimally assessed by CT. Muscles and Tendons Muscles and tendons are unremarkable. Soft tissues Mild soft tissue swelling is seen surrounding the previously noted fracture site with a mild amount of subcutaneous inflammatory fat stranding seen along the lateral aspect of the left hip. IMPRESSION: Acute comminuted fracture deformity of the greater trochanter of the proximal left femur. Electronically Signed   By: Virgina Norfolk M.D.   On: 04/08/2020 17:14   DG FEMUR MIN 2 VIEWS LEFT  Result Date: 04/08/2020 CLINICAL DATA:  Fall EXAM: PELVIS - 1-2 VIEW; LEFT FEMUR 2 VIEWS COMPARISON:  None. FINDINGS: There is a comminuted of LEFT greater trochanteric fracture. There is minimal displacement of the tip of the greater trochanter. No definite  extension through the femoral neck but evaluation is limited secondary to osteopenia. Vascular calcifications. No pelvic diastasis. Mild degenerative changes of the lower lumbar spine. Limited assessment of the contralateral hip is unremarkable IMPRESSION: Comminuted of the LEFT greater trochanteric fracture with minimal displacement of the tip of the greater trochanter. No definite extension through the femoral neck but evaluation is limited secondary to osteopenia. Electronically Signed   By: Valentino Saxon MD   On: 04/08/2020 14:55     Scheduled Meds: . arformoterol  15 mcg Nebulization BID  . aspirin EC  81 mg Oral Daily  . doxazosin  8 mg Oral Daily  . enoxaparin (LOVENOX) injection  20 mg Subcutaneous Q24H  . levothyroxine  50 mcg Oral Q0600  . metoprolol tartrate  25 mg Oral Daily  . multivitamin with minerals  1 tablet Oral Daily  . nicotine  14 mg Transdermal Daily  . pantoprazole  40 mg Oral Daily  . rosuvastatin  10 mg Oral Daily  . tamoxifen  20 mg Oral Daily  . umeclidinium bromide  1 puff Inhalation Daily   Continuous Infusions:   LOS: 0 days    Time spent: 25 mins.    Shawna Clamp, MD [facility]   If 7PM-7AM, please contact night-coverage

## 2020-04-09 NOTE — Progress Notes (Signed)
Patient admitted to room 18. Alert and oriented x4. No pain at this time. Continue on oxygen at 4lpm [place],  SAT 100%. No distress noted.

## 2020-04-10 ENCOUNTER — Other Ambulatory Visit: Payer: Self-pay | Admitting: Radiation Oncology

## 2020-04-10 DIAGNOSIS — C3412 Malignant neoplasm of upper lobe, left bronchus or lung: Secondary | ICD-10-CM

## 2020-04-10 LAB — HEMOGLOBIN AND HEMATOCRIT, BLOOD
HCT: 27.7 % — ABNORMAL LOW (ref 36.0–46.0)
Hemoglobin: 8.7 g/dL — ABNORMAL LOW (ref 12.0–15.0)

## 2020-04-10 LAB — CBC
HCT: 26.6 % — ABNORMAL LOW (ref 36.0–46.0)
Hemoglobin: 8.7 g/dL — ABNORMAL LOW (ref 12.0–15.0)
MCH: 29.6 pg (ref 26.0–34.0)
MCHC: 32.7 g/dL (ref 30.0–36.0)
MCV: 90.5 fL (ref 80.0–100.0)
Platelets: 186 10*3/uL (ref 150–400)
RBC: 2.94 MIL/uL — ABNORMAL LOW (ref 3.87–5.11)
RDW: 16.2 % — ABNORMAL HIGH (ref 11.5–15.5)
WBC: 12.9 10*3/uL — ABNORMAL HIGH (ref 4.0–10.5)
nRBC: 0 % (ref 0.0–0.2)

## 2020-04-10 NOTE — Evaluation (Signed)
Occupational Therapy Evaluation Patient Details Name: Kendra Watts MRN: [id number] DOB: [birth year] Today's Date: 04/10/2020    History of Present Illness 74 y.o. female with medical history significant of breast CA, COPD on chronic home 4L O2, CHF, HTN, recent admit for acute blood loss anemia due to nose bleed and discharged to SNF with return home 9/16 presented to ED 04/08/20 s/p fall with Lt greater trochanteric femur fx, L 3rd rib fx.    Clinical Impression   PTA, pt lives alone and reports Modified Independent with ADLs, IADLs and mobility in the home. Pt previously used cane but has been using RW since recent DC from SNF rehab. Pt presents now with diagnoses above and deficits in strength, cardiopulmonary tolerance, endurance, balance, and pain. Pt requires overall Min A for simple transfers and side stepping with RW. Pt requires up to Max A for LB ADLs due to deficits. Pt on 2 L O2 (wears this at baseline during the day) with desats after activity, but quickly recovers with seated rest break. Educated pt on benefits of tub transfer bench due to reported difficulty getting in/out of tub/shower. Recommend SNF for short term rehab to maximize independence and decrease fall risk.     Follow Up Recommendations  SNF    Equipment Recommendations  3 in 1 bedside commode;Tub/shower bench    Recommendations for Other Services       Precautions / Restrictions Precautions Precautions: Fall Precaution Comments: watch sats Restrictions Weight Bearing Restrictions: Yes LLE Weight Bearing: Weight bearing as tolerated      Mobility Bed Mobility Overal bed mobility: Needs Assistance Bed Mobility: Supine to Sit;Sit to Supine     Supine to sit: Max assist;HOB elevated Sit to supine: Max assist   General bed mobility comments: Max A with significant assist needed to advance L LE and scoot hips forward on bed  Transfers Overall transfer level: Needs assistance Equipment used: Rolling  walker (2 wheeled) Transfers: Sit to/from Stand Sit to Stand: Min assist         General transfer comment: Min A for sit to stand at bedside with RW, cues for hand placement. Pt MIn A for side stepping up to [facility] with assistance to advance RW. Increased time and effort needed for all tasks due to pt hesitation with pain and WB    Balance Overall balance assessment: Needs assistance;History of Falls Sitting-balance support: Bilateral upper extremity supported;Feet unsupported Sitting balance-Leahy Scale: Fair     Standing balance support: During functional activity;Bilateral upper extremity supported Standing balance-Leahy Scale: Poor Standing balance comment: reliant on external support                           ADL either performed or assessed with clinical judgement   ADL Overall ADL's : Needs assistance/impaired Eating/Feeding: Set up;Sitting Eating/Feeding Details (indicate cue type and reason): Setup for breakfast and repositioned in bed Grooming: Minimal assistance;Standing   Upper Body Bathing: Supervision/ safety;Sitting   Lower Body Bathing: Maximal assistance;Sit to/from stand   Upper Body Dressing : Set up;Sitting   Lower Body Dressing: Maximal assistance;Sit to/from stand Lower Body Dressing Details (indicate cue type and reason): Max A for adjusting socks, pt unable to reach feet without pain today Toilet Transfer: Minimal assistance;Stand-pivot;BSC;RW Toilet Transfer Details (indicate cue type and reason): Min A for maintaining balance and advancing RW Toileting- Clothing Manipulation and Hygiene: Maximal assistance;Sit to/from stand         General ADL  Comments: Pt with deficits in strength, cardiopulmonary tolerance, endurance, dynamic standing balance and pain      Vision Baseline Vision/History: Wears glasses;Cataracts (cloudy R eye, recent cataract surgery for L eye ) Wears Glasses: At all times Patient Visual Report: No change from  baseline Vision Assessment?: No apparent visual deficits Additional Comments: R eye cloudy d/t cataracts     Perception     Praxis      Pertinent Vitals/Pain Pain Assessment: Faces Faces Pain Scale: Hurts even more Pain Location: left hip Pain Descriptors / Indicators: Discomfort;Grimacing Pain Intervention(s): Monitored during session;Limited activity within patient's tolerance;Repositioned     Hand Dominance Right   Extremity/Trunk Assessment Upper Extremity Assessment Upper Extremity Assessment: Generalized weakness   Lower Extremity Assessment Lower Extremity Assessment: Defer to PT evaluation LLE Deficits / Details: AAROM hip flexion to 90, abdct to 20;  LLE: Unable to fully assess due to pain   Cervical / Trunk Assessment Cervical / Trunk Assessment: Kyphotic   Communication Communication Communication: HOH   Cognition Arousal/Alertness: Awake/alert Behavior During Therapy: WFL for tasks assessed/performed Overall Cognitive Status: Within Functional Limits for tasks assessed                                 General Comments: Pt able to answer all orientation questions appropriately, minor deficits in problem solving but functional   General Comments  Pt received on 2 L O2 with desats to 83% after activity, quick recovery to 93% once repositioned back in bed. No recliner chair in room and OT unable to locate - collaborated with nursing staff for provision of chair when located. Session limited due to pt desire to finish breakfast and then eat ice cream as well.     Exercises     Shoulder Instructions      Home Living Family/patient expects to be discharged to:: Private residence Living Arrangements: Alone Available Help at Discharge: Neighbor;Available PRN/intermittently Type of Home: Apartment Home Access: Stairs to enter Entrance Stairs-Number of Steps: 1 Entrance Stairs-Rails: None Home Layout: One level     Bathroom Shower/Tub:  Teacher, early years/pre: Standard     Home Equipment: Cane - single point;Walker - 2 wheels          Prior Functioning/Environment Level of Independence: Independent with assistive device(s)        Comments: Recently discharged home from SNF with RW. Prior to SNF admission, pt was Modified Independent with ADLs, IADLs in the home. Pt previously was driving and grocery shopping, but neighbors have been assisting with this at times recently.         OT Problem List: Decreased strength;Decreased activity tolerance;Impaired balance (sitting and/or standing);Decreased knowledge of use of DME or AE;Cardiopulmonary status limiting activity;Pain      OT Treatment/Interventions: Self-care/ADL training;Therapeutic exercise;Energy conservation;DME and/or AE instruction;Therapeutic activities;Patient/family education    OT Goals(Current goals can be found in the care plan section) Acute Rehab OT Goals Patient Stated Goal: get strong enough to return home alone OT Goal Formulation: With patient Time For Goal Achievement: 04/24/20 Potential to Achieve Goals: Good ADL Goals Pt Will Perform Grooming: with modified independence;standing Pt Will Perform Lower Body Bathing: with modified independence;sit to/from stand;sitting/lateral leans Pt Will Perform Lower Body Dressing: with modified independence;sitting/lateral leans;sit to/from stand Pt Will Transfer to Toilet: with modified independence;ambulating;bedside commode Pt Will Perform Toileting - Clothing Manipulation and hygiene: with modified independence;sit to/from stand;sitting/lateral leans Pt Will  Perform Tub/Shower Transfer: with set-up;ambulating;tub bench;rolling walker  OT Frequency: Min 2X/week   Barriers to D/C:            Co-evaluation              AM-PAC OT "6 Clicks" Daily Activity     Outcome Measure Help from another person eating meals?: A Little Help from another person taking care of personal  grooming?: A Little Help from another person toileting, which includes using toliet, bedpan, or urinal?: A Lot Help from another person bathing (including washing, rinsing, drying)?: A Lot Help from another person to put on and taking off regular upper body clothing?: A Little Help from another person to put on and taking off regular lower body clothing?: A Lot 6 Click Score: 15   End of Session Equipment Utilized During Treatment: Gait belt;Rolling walker;Oxygen Nurse Communication: Mobility status  Activity Tolerance: Patient tolerated treatment well Patient left: in bed;with call bell/phone within reach;with bed alarm set;with nursing/sitter in room (NT assessing vitals)  OT Visit Diagnosis: Unsteadiness on feet (R26.81);Other abnormalities of gait and mobility (R26.89);Muscle weakness (generalized) (M62.81);History of falling (Z91.81);Pain Pain - Right/Left: Left Pain - part of body: Hip                Time: 9458-5929 OT Time Calculation (min): 53 min Charges:  OT General Charges $OT Visit: 1 Visit OT Evaluation $OT Eval Moderate Complexity: 1 Mod OT Treatments $Self Care/Home Management : 8-22 mins $Therapeutic Activity: 23-37 mins  Layla Maw, OTR/L  Layla Maw 04/10/2020, 8:48 AM

## 2020-04-10 NOTE — Progress Notes (Signed)
   04/10/20 1437  Clinical Encounter Type  Visited With Patient  Visit Type Initial;Other (Comment) (Advanced Directive)  Referral From Palliative care team  Consult/Referral To Chaplain  Stress Factors  Patient Stress Factors Health changes  This chaplain responded to PMT consult for spiritual care. The Pt. declined updating her HCPOA today.  The Pt.'s intention is to name [name], one of her nephews, as her Healthcare Agent when she has time to become more familiar with the document. The chaplain highlighted the AD's main talking points with the Pt. and left the document bedside with the Pt.    The chaplain listened to the Pt. describe her dog "Izzy" and desire to return home for a second round of rehab.  The chaplain understands the Pt. will contact the chaplain if she chooses to complete the document or request F/U spiritual care in the hospital.

## 2020-04-10 NOTE — NC FL2 (Signed)
[place] LEVEL OF CARE SCREENING TOOL     IDENTIFICATION  Patient Name: Kendra Watts Birthdate: [birth year] Sex: female Admission Date (Current Location): 04/08/2020  [facility] and [place] Number:  Herbalist and Address:  [facility]. [facility], [place] [address] [place], [place] [address]      Provider Number:    Attending Physician Name and Address:  Shawna Clamp, MD  Relative Name and Phone Number:  Heidi Dach [phone]    Current Level of Care: Hospital Recommended Level of Care: [place] Prior Approval Number:    Date Approved/Denied:   PASRR Number: [id number]  Discharge Plan: SNF    Current Diagnoses: Patient Active Problem List   Diagnosis Date Noted  . Multiple closed fractures of ribs of left side   . Fall   . Palliative care by specialist   . Goals of care, counseling/discussion   . Advanced directives, counseling/discussion   . Generalized weakness   . Fracture of one rib, left side, initial encounter for closed fracture 04/08/2020  . Fracture of greater trochanter of left femur ([place]) 04/08/2020  . Closed fracture of greater trochanter of left femur ([place]) 04/08/2020  . Epistaxis 03/13/2020  . Acute blood loss anemia 03/13/2020  . Current smoker   . Hypertension, uncontrolled   . 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 Hemoptysis 09/14/2018  . 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 Adenocarcinoma, lung, right ([place]) 09/10/2018  . Multiple lung nodules 07/20/2018  . Chronic obstructive pulmonary disease ([place]) 07/20/2018  . Acute CHF (congestive heart failure) ([place]) 07/06/2018  . Chronic respiratory failure with hypoxia ([place]) 07/06/2018  . Hyponatremia 07/06/2018  . Anemia 07/06/2018  . Breast cancer of upper-outer quadrant of left female breast ([place]) 09/08/2015  . Renal artery stenosis ([place]) 08/23/2014  . Right upper extremity numbness 07/01/2013  .  Atherosclerotic renal artery stenosis, bilateral ([place]) 05/26/2013  . Peripheral arterial disease ([place]) 05/26/2013  . Essential hypertension 05/26/2013  . Hyperlipidemia 05/26/2013  . Tobacco abuse 05/26/2013  . Carotid artery stenosis 03/09/2012    Orientation RESPIRATION BLADDER Height & Weight     Self, Time, Situation, Place  O2 Continent Weight: 88 lb 2.9 oz (40 kg) Height:  5' (152.4 cm)  BEHAVIORAL SYMPTOMS/MOOD NEUROLOGICAL BOWEL NUTRITION STATUS      Continent Diet (Regular.  See DC summary)  AMBULATORY STATUS COMMUNICATION OF NEEDS Skin   Extensive Assist   Other (Comment) (ecchymosis)                       Personal Care Assistance Level of Assistance  Bathing, Feeding, Dressing Bathing Assistance: Maximum assistance Feeding assistance: Independent Dressing Assistance: Maximum assistance     Functional Limitations Info  Sight, Hearing, Speech Sight Info: Adequate Hearing Info: Adequate Speech Info: Adequate    SPECIAL CARE FACTORS FREQUENCY  PT (By licensed PT), OT (By licensed OT)     PT Frequency: 5x week OT Frequency: 5x week            Contractures Contractures Info: Not present    Additional Factors Info    Code Status Info: full Allergies Info: NKA           Current Medications (04/10/2020):  This is the current hospital active medication list Current Facility-Administered Medications  Medication Dose Route Frequency Provider Last Rate Last Admin  . acetaminophen (TYLENOL) tablet 650 mg  650 mg Oral Q6H PRN  Etta Quill, DO   650 mg at 04/09/20 3810   Or  . acetaminophen (TYLENOL) suppository 650 mg  650 mg Rectal Q6H PRN Etta Quill, DO      . albuterol (VENTOLIN HFA) 108 (90 Base) MCG/ACT inhaler 2 puff  2 puff Inhalation Q6H PRN Etta Quill, DO      . arformoterol [facility]) nebulizer solution 15 mcg  15 mcg Nebulization BID Etta Quill, DO   15 mcg at 04/09/20 2102  . aspirin EC tablet 81 mg  81 mg Oral Daily  Etta Quill, DO   81 mg at 04/10/20 1036  . doxazosin (CARDURA) tablet 8 mg  8 mg Oral Daily Jennette Kettle M, DO   8 mg at 04/10/20 1036  . enoxaparin (LOVENOX) 100 mg/mL injection 20 mg  20 mg Subcutaneous Q24H Alvira Philips, [place]   20 mg at 04/09/20 2144  . HYDROcodone-acetaminophen (NORCO/VICODIN) 5-325 MG per tablet 1-2 tablet  1-2 tablet Oral Q4H PRN Etta Quill, DO      . ibuprofen (ADVIL) tablet 400 mg  400 mg Oral Q6H PRN Etta Quill, DO      . levothyroxine (SYNTHROID) tablet 50 mcg  50 mcg Oral Q0600 Etta Quill, DO   50 mcg at 04/10/20 0646  . metoprolol tartrate (LOPRESSOR) tablet 25 mg  25 mg Oral Daily Jennette Kettle M, DO   25 mg at 04/10/20 1036  . multivitamin with minerals tablet 1 tablet  1 tablet Oral Daily Jennette Kettle M, DO   1 tablet at 04/10/20 1036  . nicotine (NICODERM CQ - dosed in mg/24 hours) patch 14 mg  14 mg Transdermal Daily Jennette Kettle M, DO   14 mg at 04/10/20 1036  . ondansetron (ZOFRAN) tablet 4 mg  4 mg Oral Q6H PRN Etta Quill, DO       Or  . ondansetron [facility]) injection 4 mg  4 mg Intravenous Q6H PRN Etta Quill, DO      . pantoprazole (PROTONIX) EC tablet 40 mg  40 mg Oral Daily Jennette Kettle M, DO   40 mg at 04/10/20 1037  . polyethylene glycol (MIRALAX / GLYCOLAX) packet 17 g  17 g Oral Daily PRN Etta Quill, DO   17 g at 04/10/20 1037  . rosuvastatin (CRESTOR) tablet 10 mg  10 mg Oral Daily Jennette Kettle M, DO   10 mg at 04/10/20 1037  . tamoxifen (NOLVADEX) tablet 20 mg  20 mg Oral Daily Alcario Drought, Jared M, DO   20 mg at 04/10/20 1037  . umeclidinium bromide (INCRUSE ELLIPTA) 62.5 MCG/INH 1 puff  1 puff Inhalation Daily Etta Quill, DO   1 puff at 04/09/20 1117     Discharge Medications: Please see discharge summary for a list of discharge medications.  Relevant Imaging Results:  Relevant Lab Results:   Additional Information ss#[id number]  Joanne Chars, LCSW

## 2020-04-10 NOTE — TOC Progression Note (Addendum)
Transition of Care [facility]) - Progression Note    Patient Details  Name: Kendra Watts MRN: [id number] Date of Birth: [birth year]  [facility] CM/SW Contact  Joanne Chars, LCSW Phone Number: 04/10/2020, 3:51 PM  Clinical Narrative:   Recommendation for SNF discussed with pt, who is agreeable to this plan.  Pt just left [facility] and is open to a return. Choice document provided.  (CSW later spoke with Narda Rutherford at [facility] who will review referral.)  Pt reports she was at [facility] x3 weeks and we discussed that she will have copays for another stay, which pt said is not a barrier.      Expected Discharge Plan: Skilled Nursing Facility Barriers to Discharge: Continued Medical Work up  Expected Discharge Plan and Services Expected Discharge Plan: Post In-house Referral: Clinical Social Work Discharge Planning Services: CM Consult Post Acute Care Choice: [place] Living arrangements for the past 2 months: Apartment                                       Social Determinants of Health (SDOH) Interventions    Readmission Risk Interventions Readmission Risk Prevention Plan 03/16/2020  Transportation Screening Complete  PCP or Specialist Appt within 3-5 Days Complete  HRI or Home Care Consult Complete  Social Work Consult for [place] Planning/Counseling Complete  Palliative Care Screening Not Applicable  Medication Review [employer]) Complete  Some recent data might be hidden

## 2020-04-10 NOTE — Progress Notes (Addendum)
PROGRESS NOTE    Kendra Watts  WFU:[id number] DOB: 25-Jan-1946 DOA: 04/08/2020 PCP: Merrilee Seashore, MD   Brief Narrative: 74 years old female with medical history significant for breast cancer, NSCLC status post radiation, in remission /very small tumor burden followed closely by Dr. Sondra Come, COPD on chronic home oxygen 4 L.  Presents in the emergency department status post mechanical fall with left femur fracture( greater trochanter.  Ortho consulted recommended non-operative management. She was recently admitted last month with acute blood loss anemia due to recurrent nosebleeds,  required transfusion. She was discharged to skilled nursing facility and discharged back home on 9/16.  She had a mechanical fall on morning 9/18.    Assessment & Plan:   Principal Problem:   Fracture of greater trochanter of left femur (HCC) Active Problems:   Essential hypertension   Chronic respiratory failure with hypoxia (HCC)   Multiple lung nodules   Chronic obstructive pulmonary disease (HCC)   Squamous cell lung cancer, left (HCC)   Adenocarcinoma, lung, right (HCC)   Fracture of one rib, left side, initial encounter for closed fracture   Closed fracture of greater trochanter of left femur (HCC)   Mechanical fall with Fx of greater troch of L femur Ortho consulted , recommended non operative management. PT/OT evaluation recommended SNF SW eval and probably SNF vs CIR placement. Pain control: tylenol, ibuprofen, and norco PRN. WBAT as per ortho recommendations.  Left 3rd rib fx - conservative management. Incentive spirometry. Pain ctrl  HTN - controlled  .Cont home BP meds.  COPD - Not in exacerbation: Cont home nebs, Cont O2 via [place]  Multiple lung nodules, h/o multiple NSCLCs - mostly stable findings Though has new 0.28mm pulm nodule and 2x ~2cm lymph nodes in pelvis Discussed with Dr. Sondra Come,  he wants to repeat CT scan in 3 months.   DVT prophylaxis: Lovenox. Code Status:  Full Family Communication:  No family at bed side. Disposition Plan:   Status is: Inpatient  Remains inpatient appropriate because:Inpatient level of care appropriate due to severity of illness   Dispo: The patient is from: Home              Anticipated d/c is to: SNF              Anticipated d/c date is: 3 days              Patient currently is not medically stable to d/c.  Consultants:   orthopeadics  Procedures: None. Antimicrobials:  Anti-infectives (From admission, onward)   None      Subjective: Patient was seen and examined at bedside.  Overnight events noted.  Patient is sitting comfortably in the chair.  she reports pain happens when she moves otherwise pain is better controlled.   Objective: Vitals:   04/09/20 2105 04/10/20 0300 04/10/20 0841 04/10/20 1501  BP:  (!) 156/62 (!) 156/68 (!) 142/59  Pulse:  73 87 79  Resp:  17 18 18   Temp:  98.6 F (37 C) 98.4 F (36.9 C) 97.7 F (36.5 C)  TempSrc:  Oral Oral Oral  SpO2: 96% 100% 98% 99%  Weight:      Height:        Intake/Output Summary (Last 24 hours) at 04/10/2020 1611 Last data filed at 04/10/2020 1500 Gross per 24 hour  Intake 960 ml  Output 400 ml  Net 560 ml   Filed Weights   04/08/20 1250  Weight: 40 kg    Examination:  General  exam: Appears calm and comfortable. Alert , oriented x 3. Respiratory system: Clear to auscultation. Respiratory effort normal. Left sided tenderness noted in chest wall. Cardiovascular system: S1 & S2 heard, RRR. No JVD, murmurs, rubs, gallops or clicks. No pedal edema. Gastrointestinal system: Abdomen is nondistended, soft and nontender. No organomegaly or masses felt. Normal bowel sounds heard. Central nervous system: Alert and oriented. No focal neurological deficits. Extremities:  Left hip tenderness+ ROM: reduced. Skin: No rashes, lesions or ulcers Psychiatry: Judgement and insight appear normal. Mood & affect appropriate.     Data Reviewed: I have  personally reviewed following labs and imaging studies  CBC: Recent Labs  Lab 04/08/20 1325 04/10/20 0203 04/10/20 1043  WBC 11.9* 12.9*  --   NEUTROABS 10.0*  --   --   HGB 9.4* 8.7* 8.7*  HCT 29.6* 26.6* 27.7*  MCV 88.6 90.5  --   PLT 211 186  --    Basic Metabolic Panel: Recent Labs  Lab 04/08/20 1325  NA 137  K 3.8  CL 100  CO2 26  GLUCOSE 98  BUN 13  CREATININE 0.94  CALCIUM 9.2   GFR: Estimated Creatinine Clearance: 33.7 mL/min (by C-G formula based on SCr of 0.94 mg/dL). Liver Function Tests: No results for input(s): AST, ALT, ALKPHOS, BILITOT, PROT, ALBUMIN in the last 168 hours. No results for input(s): LIPASE, AMYLASE in the last 168 hours. No results for input(s): AMMONIA in the last 168 hours. Coagulation Profile: No results for input(s): INR, PROTIME in the last 168 hours. Cardiac Enzymes: No results for input(s): CKTOTAL, CKMB, CKMBINDEX, TROPONINI in the last 168 hours. BNP (last 3 results) No results for input(s): PROBNP in the last 8760 hours. HbA1C: No results for input(s): HGBA1C in the last 72 hours. CBG: No results for input(s): GLUCAP in the last 168 hours. Lipid Profile: No results for input(s): CHOL, HDL, LDLCALC, TRIG, CHOLHDL, LDLDIRECT in the last 72 hours. Thyroid Function Tests: No results for input(s): TSH, T4TOTAL, FREET4, T3FREE, THYROIDAB in the last 72 hours. Anemia Panel: No results for input(s): VITAMINB12, FOLATE, FERRITIN, TIBC, IRON, RETICCTPCT in the last 72 hours. Sepsis Labs: No results for input(s): PROCALCITON, LATICACIDVEN in the last 168 hours.  Recent Results (from the past 240 hour(s))  SARS Coronavirus 2 by RT PCR (hospital order, performed in [facility] hospital lab) Nasopharyngeal Nasopharyngeal Swab     Status: None   Collection Time: 04/08/20  3:05 PM   Specimen: Nasopharyngeal Swab  Result Value Ref Range Status   SARS Coronavirus 2 NEGATIVE NEGATIVE Final    Comment: (NOTE) SARS-CoV-2 target nucleic  acids are NOT DETECTED.  The SARS-CoV-2 RNA is generally detectable in upper and lower respiratory specimens during the acute phase of infection. The lowest concentration of SARS-CoV-2 viral copies this assay can detect is 250 copies / mL. A negative result does not preclude SARS-CoV-2 infection and should not be used as the sole basis for treatment or other patient management decisions.  A negative result may occur with improper specimen collection / handling, submission of specimen other than nasopharyngeal swab, presence of viral mutation(s) within the areas targeted by this assay, and inadequate number of viral copies (<250 copies / mL). A negative result must be combined with clinical observations, patient history, and epidemiological information.  Fact Sheet for Patients:   StrictlyIdeas.no  Fact Sheet for Healthcare Providers: BankingDealers.co.za  This test is not yet approved or  cleared by the [place] FDA and has been authorized for detection  and/or diagnosis of SARS-CoV-2 by FDA under an Emergency Use Authorization (EUA).  This EUA will remain in effect (meaning this test can be used) for the duration of the COVID-19 declaration under Section 564(b)(1) of the Act, 21 U.S.C. section 360bbb-3(b)(1), unless the authorization is terminated or revoked sooner.  Performed at [facility], [place] [address] [place], [place] [address]          Radiology Studies: CT Chest W Contrast  Result Date: 04/08/2020 CLINICAL DATA:  Status post trauma. EXAM: CT CHEST, ABDOMEN, AND PELVIS WITH CONTRAST TECHNIQUE: Multidetector CT imaging of the chest, abdomen and pelvis was performed following the standard protocol during bolus administration of intravenous contrast. CONTRAST:  133mL OMNIPAQUE IOHEXOL 300 MG/ML  SOLN COMPARISON:  None. November 15, 2019 FINDINGS: CT CHEST FINDINGS Cardiovascular: There is marked severity calcification  of the thoracic aorta and great vessels. Normal heart size with marked severity coronary artery calcification. No pericardial effusion. Mediastinum/Nodes: No enlarged mediastinal, hilar, or axillary lymph nodes. Thyroid gland, trachea, and esophagus demonstrate no significant findings. Lungs/Pleura: There is moderate severity emphysematous lung disease. A stable 1.0 cm x 0.7 cm noncalcified lung nodule is seen within the posteromedial aspect of the right apex. Small metallic density surgical material is also seen within this region. Stable moderate severity scarring and/or atelectasis is seen within the posterior aspect of the left upper lobe. A stable 1.6 cm x 0.7 cm noncalcified nodular component is seen (axial CT image 37, CT series number 5). Small metallic density surgical material is also seen within this area. A stable 2 mm posteromedial left lower lobe noncalcified lung nodule is seen (axial CT image 91, CT series number 5). A 0.6 cm pleural based noncalcified lung nodule is seen within the posterior aspect of the left lower lobe (axial CT image 102, CT series number 5). This represents a new finding when compared to the prior exam. A stable 0.9 cm x 0.6 cm mildly lobulated noncalcified lung nodule is seen within the posterolateral aspect of the right lower lobe (axial CT image 103, CT series number 5). Stable mild pleural thickening is seen along the posterior aspect of the left lung base. This represents a new finding when compared to the prior study. Musculoskeletal: An acute lateral third left rib fracture is seen. A stable lateral fourth left rib fracture is noted. A chronic compression fracture deformity is seen involving the T3 vertebral body. Interval decrease in vertebral body height is seen since the prior study. CT ABDOMEN PELVIS FINDINGS Hepatobiliary: No focal liver abnormality is seen. No gallstones, gallbladder wall thickening, or biliary dilatation. Pancreas: Unremarkable. No pancreatic  ductal dilatation or surrounding inflammatory changes. Spleen: Normal in size without focal abnormality. Adrenals/Urinary Tract: Adrenal glands are unremarkable. The left kidney is atrophic in appearance. Compensatory hypertrophy of the right kidney is seen. Subcentimeter cysts are noted within the upper pole of the left kidney. There is no evidence of renal calculi or hydronephrosis. The urinary bladder is moderately distended and otherwise normal in appearance. Stomach/Bowel: Stomach is within normal limits. The appendix is not clearly identified. No evidence of bowel wall thickening, distention, or inflammatory changes. Vascular/Lymphatic: There is marked severity calcification and tortuosity of the abdominal aorta and bilateral common iliac arteries, without evidence of aneurysmal dilatation. A 1.9 cm x 1.5 cm lymph node is suspected along the medial aspect of the mid to lower right pelvis. A similar appearing 2.1 cm x 1.4 cm area is noted within this region on the left.  Reproductive: Status post hysterectomy. No adnexal masses. Other: No abdominal wall hernia or abnormality. No abdominopelvic ascites. Musculoskeletal: Acute fracture deformity is seen involving the greater trochanter of the proximal left femur. Marked severity degenerative changes seen at the level of L2-L3. IMPRESSION: 1. Acute lateral third left rib fracture with a stable lateral fourth left rib fracture. 2. Acute fracture of the greater trochanter of the proximal left femur. 3. Stable noncalcified lung nodules within the bilateral lungs, as described above with stable posterior left upper lobe scarring and/or atelectasis. 4. 6 mm pleural based noncalcified left lower lobe lung nodule which represents a new finding when compared to the prior exam. Three-month follow-up chest CT is recommended to determine stability. 5. Moderate severity emphysematous lung disease. 6. Mild pleural thickening along the posterior aspect of the left lung base.  This represents a new finding when compared to the prior study. Correlation with follow-up chest CT is again recommended to determine stability and exclude the presence of an underlying neoplasm. 7. Chronic compression fracture deformity involving the T3 vertebral body with interval decrease in vertebral body height since the prior study. Aortic Atherosclerosis (ICD10-I70.0) and Emphysema (ICD10-J43.9). Electronically Signed   By: Virgina Norfolk M.D.   On: 04/08/2020 17:05   CT ABDOMEN PELVIS W CONTRAST  Result Date: 04/08/2020 CLINICAL DATA:  Status post trauma. EXAM: CT CHEST, ABDOMEN, AND PELVIS WITH CONTRAST TECHNIQUE: Multidetector CT imaging of the chest, abdomen and pelvis was performed following the standard protocol during bolus administration of intravenous contrast. CONTRAST:  122mL OMNIPAQUE IOHEXOL 300 MG/ML  SOLN COMPARISON:  None. FINDINGS: CT CHEST FINDINGS Cardiovascular: There is marked severity calcification of the thoracic aorta and great vessels. Normal heart size with marked severity coronary artery calcification. No pericardial effusion. Mediastinum/Nodes: No enlarged mediastinal, hilar, or axillary lymph nodes. Thyroid gland, trachea, and esophagus demonstrate no significant findings. Lungs/Pleura: There is moderate severity emphysematous lung disease. A stable 1.0 cm x 0.7 cm noncalcified lung nodule is seen within the posteromedial aspect of the right apex. Small metallic density surgical material is also seen within this region. Stable moderate severity scarring and/or atelectasis is seen within the posterior aspect of the left upper lobe. A stable 1.6 cm x 0.7 cm noncalcified nodular component is seen (axial CT image 37, CT series number 5). Small metallic density surgical material is also seen within this area. A stable 2 mm posteromedial left lower lobe noncalcified lung nodule is seen (axial CT image 91, CT series number 5). A 0.6 cm pleural based noncalcified lung nodule is  seen within the posterior aspect of the left lower lobe (axial CT image 102, CT series number 5). This represents a new finding when compared to the prior exam. A stable 0.9 cm x 0.6 cm mildly lobulated noncalcified lung nodule is seen within the posterolateral aspect of the right lower lobe (axial CT image 103, CT series number 5). Stable mild pleural thickening is seen along the posterior aspect of the left lung base. This represents a new finding when compared to the prior study. Musculoskeletal: An acute lateral third left rib fracture is seen. A stable lateral fourth left rib fracture is noted. A chronic compression fracture deformity is seen involving the T3 vertebral body. Interval decrease in vertebral body height is seen since the prior study. CT ABDOMEN PELVIS FINDINGS Hepatobiliary: No focal liver abnormality is seen. No gallstones, gallbladder wall thickening, or biliary dilatation. Pancreas: Unremarkable. No pancreatic ductal dilatation or surrounding inflammatory changes. Spleen: Normal in size without  focal abnormality. Adrenals/Urinary Tract: Adrenal glands are unremarkable. The left kidney is atrophic in appearance. Compensatory hypertrophy of the right kidney is seen. Subcentimeter cysts are noted within the upper pole of the left kidney. There is no evidence of renal calculi or hydronephrosis. The urinary bladder is moderately distended and otherwise normal in appearance. Stomach/Bowel: Stomach is within normal limits. The appendix is not clearly identified. No evidence of bowel wall thickening, distention, or inflammatory changes. Vascular/Lymphatic: There is marked severity calcification and tortuosity of the abdominal aorta and bilateral common iliac arteries, without evidence of aneurysmal dilatation. A 1.9 cm x 1.5 cm lymph node is suspected along the medial aspect of the mid to lower right pelvis. A similar appearing 2.1 cm x 1.4 cm area is noted within this region on the left. Reproductive:  Status post hysterectomy. No adnexal masses. Other: No abdominal wall hernia or abnormality. No abdominopelvic ascites. Musculoskeletal: Acute fracture deformity is seen involving the greater trochanter of the proximal left femur. Marked severity degenerative changes seen at the level of L2-L3. IMPRESSION: 1. Acute lateral third left rib fracture with a stable lateral fourth left rib fracture. 2. Acute fracture of the greater trochanter of the proximal left femur. 3. Stable noncalcified lung nodules within the bilateral lungs, as described above with stable posterior left upper lobe scarring and/or atelectasis. 4. 6 mm pleural based noncalcified left lower lobe lung nodule which represents a new finding when compared to the prior exam. Three-month follow-up chest CT is recommended to determine stability. 5. Moderate severity emphysematous lung disease. 6. Mild pleural thickening along the posterior aspect of the left lung base. This represents a new finding when compared to the prior study. Correlation with follow-up chest CT is again recommended to determine stability and exclude the presence of an underlying neoplasm. 7. Chronic compression fracture deformity involving the T3 vertebral body with interval decrease in vertebral body height since the prior study. Aortic Atherosclerosis (ICD10-I70.0) and Emphysema (ICD10-J43.9). Electronically Signed   By: Virgina Norfolk M.D.   On: 04/08/2020 17:05   CT NO CHARGE  Result Date: 04/08/2020 CLINICAL DATA:  Status post trauma. EXAM: CT OF THE LEFT HIP WITHOUT CONTRAST TECHNIQUE: Multidetector CT imaging of the left hip was performed according to the standard protocol. Multiplanar CT image reconstructions were also generated. COMPARISON:  None. FINDINGS: Bones/Joint/Cartilage An acute comminuted fracture deformity is seen involving the greater trochanter of the proximal left femur. Mild extension to involve the adjacent inter trochanteric portion of the proximal  left femur is seen. There is no evidence of dislocation. Ligaments Suboptimally assessed by CT. Muscles and Tendons Muscles and tendons are unremarkable. Soft tissues Mild soft tissue swelling is seen surrounding the previously noted fracture site with a mild amount of subcutaneous inflammatory fat stranding seen along the lateral aspect of the left hip. IMPRESSION: Acute comminuted fracture deformity of the greater trochanter of the proximal left femur. Electronically Signed   By: Virgina Norfolk M.D.   On: 04/08/2020 17:14     Scheduled Meds: . arformoterol  15 mcg Nebulization BID  . aspirin EC  81 mg Oral Daily  . doxazosin  8 mg Oral Daily  . enoxaparin (LOVENOX) injection  20 mg Subcutaneous Q24H  . levothyroxine  50 mcg Oral Q0600  . metoprolol tartrate  25 mg Oral Daily  . multivitamin with minerals  1 tablet Oral Daily  . nicotine  14 mg Transdermal Daily  . pantoprazole  40 mg Oral Daily  . rosuvastatin  10  mg Oral Daily  . tamoxifen  20 mg Oral Daily  . umeclidinium bromide  1 puff Inhalation Daily   Continuous Infusions:   LOS: 1 day    Time spent: 25 mins.    Shawna Clamp, MD [facility]   If 7PM-7AM, please contact night-coverage

## 2020-04-10 NOTE — Progress Notes (Signed)
Physical Therapy Treatment Patient Details Name: Kendra Watts MRN: [id number] DOB: 1945/08/23 Today's Date: 04/10/2020    History of Present Illness 74 y.o. female with medical history significant of breast CA, COPD on chronic home 4L O2, CHF, HTN, recent admit for acute blood loss anemia due to nose bleed and discharged to SNF with return home 9/16 presented to ED 04/08/20 s/p fall with Lt greater trochanteric femur fx, L 3rd rib fx.     PT Comments    Today's skilled session focused on mobility progression with less overall assistance needed and increased activity performed. Pt continues to need increased time for mobility due to pain. Acute PT to continue during pt's hospital stay.    Follow Up Recommendations  SNF     Equipment Recommendations   (To be determined by next venue of care)    Recommendations for Other Services OT consult     Precautions / Restrictions Precautions Precautions: Fall Precaution Comments: watch sats Restrictions LLE Weight Bearing: Weight bearing as tolerated    Mobility  Bed Mobility Overal bed mobility: Needs Assistance Bed Mobility: Supine to Sit     Supine to sit: Mod assist;HOB elevated     General bed mobility comments: increased time and effort for pt's max participation. pt able to assist with advancing bil LE's toward and off edge of bed with HOB 30 degrees. assist needed to bring trunk into sitting position, then pt was able to use UE's to fully scoot to edge of bed. supervision for sitting at edge of bed with intermittent UE support.  Transfers Overall transfer level: Needs assistance Equipment used: Rolling walker (2 wheeled) Transfers: Sit to/from [facility] Sit to Stand: Min assist;Mod assist Stand pivot transfers: Mod assist;Min assist       General transfer comment: min assist to stand bed>RW, mod assist to stand BSC>RW and min assist for sitting to [facility] and recliner with cues for hand placment and  weight shifting. SPT with RW bed>BSC, then BSC>recliner with up to mod assist to [facility] and min assist to chair. cues on sequencing, posture and bil LE advancement.      Cognition Arousal/Alertness: Awake/alert Behavior During Therapy: WFL for tasks assessed/performed Overall Cognitive Status: Within Functional Limits for tasks assessed                   Exercises General Exercises - Lower Extremity Long Arc Quad: AROM;Strengthening;Both;10 reps;Seated Toe Raises: AROM;Strengthening;Both;10 reps;Seated Heel Raises: AROM;Strengthening;Both;10 reps;Seated     Pertinent Vitals/Pain Pain Assessment: 0-10 Pain Score: 5  Pain Location: left hip Pain Descriptors / Indicators: Discomfort;Grimacing;Guarding Pain Intervention(s): Limited activity within patient's tolerance;Monitored during session;Premedicated before session;Repositioned     PT Goals (current goals can now be found in the care plan section) Acute Rehab PT Goals Patient Stated Goal: get strong enough to return home alone PT Goal Formulation: With patient Time For Goal Achievement: 04/23/20 Potential to Achieve Goals: Good Progress towards PT goals: Progressing toward goals    Frequency    Min 3X/week      PT Plan Current plan remains appropriate       AM-PAC PT "6 Clicks" Mobility   Outcome Measure  Help needed turning from your back to your side while in a flat bed without using bedrails?: A Lot Help needed moving from lying on your back to sitting on the side of a flat bed without using bedrails?: A Lot Help needed moving to and from a bed to a chair (including a  wheelchair)?: A Lot Help needed standing up from a chair using your arms (e.g., wheelchair or bedside chair)?: A Little Help needed to walk in hospital room?: A Little Help needed climbing 3-5 steps with a railing? : Total 6 Click Score: 13    End of Session Equipment Utilized During Treatment: Gait belt;Oxygen Activity Tolerance: Patient  tolerated treatment well Patient left: in chair;with call bell/phone within reach;with chair alarm set;with nursing/sitter in room Nurse Communication: Mobility status PT Visit Diagnosis: Muscle weakness (generalized) (M62.81);History of falling (Z91.81);Difficulty in walking, not elsewhere classified (R26.2);Pain Pain - Right/Left: Left Pain - part of body: Hip     Time: 0300-9233 PT Time Calculation (min) (ACUTE ONLY): 50 min  Charges:  $Therapeutic Exercise: 8-22 mins $Therapeutic Activity: 23-37 mins                    Willow Ora, PTA, [facility] Acute Rehab Services Office- [phone] 04/10/20, 11:03 AM  Willow Ora 04/10/2020, 11:02 AM

## 2020-04-11 DIAGNOSIS — S7292XA Unspecified fracture of left femur, initial encounter for closed fracture: Secondary | ICD-10-CM

## 2020-04-11 HISTORY — DX: Unspecified fracture of left femur, initial encounter for closed fracture: S72.92XA

## 2020-04-11 LAB — HEMOGLOBIN AND HEMATOCRIT, BLOOD
HCT: 26.4 % — ABNORMAL LOW (ref 36.0–46.0)
Hemoglobin: 8.3 g/dL — ABNORMAL LOW (ref 12.0–15.0)

## 2020-04-11 LAB — CBC
HCT: 27.7 % — ABNORMAL LOW (ref 36.0–46.0)
Hemoglobin: 8.5 g/dL — ABNORMAL LOW (ref 12.0–15.0)
MCH: 27.6 pg (ref 26.0–34.0)
MCHC: 30.7 g/dL (ref 30.0–36.0)
MCV: 89.9 fL (ref 80.0–100.0)
Platelets: 205 10*3/uL (ref 150–400)
RBC: 3.08 MIL/uL — ABNORMAL LOW (ref 3.87–5.11)
RDW: 16.2 % — ABNORMAL HIGH (ref 11.5–15.5)
WBC: 9.2 10*3/uL (ref 4.0–10.5)
nRBC: 0 % (ref 0.0–0.2)

## 2020-04-11 LAB — SARS CORONAVIRUS 2 BY RT PCR (HOSPITAL ORDER, PERFORMED IN ~~LOC~~ HOSPITAL LAB): SARS Coronavirus 2: NEGATIVE

## 2020-04-11 LAB — OCCULT BLOOD X 1 CARD TO LAB, STOOL: Fecal Occult Bld: NEGATIVE

## 2020-04-11 NOTE — Progress Notes (Signed)
PROGRESS NOTE    Kendra Watts  CMK:[id number] DOB: [birth year] DOA: 04/08/2020 PCP: Kendra Seashore, MD   Brief Narrative: 74 years old female with medical history significant for breast cancer, NSCLC status post radiation, in remission /very small tumor burden followed closely by Dr. Sondra Watts, COPD on chronic home oxygen 4 L.  Presents in the emergency department status post mechanical fall with left femur fracture( greater trochanter.  Ortho consulted recommended non-operative management. She was recently admitted last month with acute blood loss anemia due to recurrent nosebleeds,  required transfusion. She was discharged to skilled nursing facility and discharged back home on 9/16.  She had a mechanical fall on morning 9/18.  He is found to have fracture of greater trochanter of left femur and fracture of left third rib.  Orthopedic consulted recommended nonoperative management. PT/ OT.  Pain control,  weightbearing as tolerated.   Assessment & Plan:   Principal Problem:   Fracture of greater trochanter of left femur (HCC) Active Problems:   Essential hypertension   Chronic respiratory failure with hypoxia (HCC)   Multiple lung nodules   Chronic obstructive pulmonary disease (HCC)   Squamous cell lung cancer, left (HCC)   Adenocarcinoma, lung, right (HCC)   Fracture of one rib, left side, initial encounter for closed fracture   Closed fracture of greater trochanter of left femur (HCC)   Mechanical fall with Fx of greater troch of L femur Ortho consulted , recommended non operative management. PT/OT evaluation recommended SNF SW eval and probably SNF vs CIR placement. Pain control: tylenol, ibuprofen, and norco PRN. WBAT as per ortho recommendations. Patient wants to go back to [place] home.  Left 3rd rib fx - conservative management. Incentive spirometry. Pain Control.  HTN - controlled  .Cont home BP meds.  COPD - Not in exacerbation: Cont home nebs, Cont  O2 via   Multiple lung nodules, h/o multiple NSCLCs - mostly stable findings Though has new 0.62mm pulm nodule and 2x ~2cm lymph nodes in pelvis Discussed with Dr. Sondra Watts,  he wants to repeat CT scan in 3 months.   Normocytic / Normochromic Anemia :  Hb at admission 10.1, trended down to 8.3, no obvious bleeding. Check stool for occult bleeding.   DVT prophylaxis: Lovenox. Code Status: Full Family Communication:  No family at bed side. Disposition Plan:   Status is: Inpatient  Remains inpatient appropriate because:Inpatient level of care appropriate due to severity of illness   Dispo: The patient is from: Home              Anticipated d/c is to: SNF              Anticipated d/c date is: 3 days              Patient currently is not medically stable to d/c.  Consultants:   orthopeadics  Procedures: None. Antimicrobials:  Anti-infectives (From admission, onward)   None      Subjective: Patient was seen and examined at bedside.  Overnight events noted.  Patient is sitting comfortably in the chair.   she reports pain  Is better controlled, able to get out of bed to chair. She denies SOB, dizziness.   Objective: Vitals:   04/10/20 1945 04/10/20 2116 04/11/20 0900 04/11/20 1520  BP:  120/68 122/71 97/63  Pulse:  80 77 78  Resp:  16 17 16   Temp:  98 F (36.7 C) 98 F (36.7 C) 98.3 F (36.8 C)  TempSrc:  Oral  Oral Oral  SpO2: 98% 100% 99% 99%  Weight:      Height:        Intake/Output Summary (Last 24 hours) at 04/11/2020 1634 Last data filed at 04/11/2020 1500 Gross per 24 hour  Intake 720 ml  Output --  Net 720 ml   Filed Weights   04/08/20 1250  Weight: 40 kg    Examination:  General exam: Appears calm and comfortable. Alert , oriented x 3. Respiratory system: Clear to auscultation. Respiratory effort normal.  Improved Left sided tenderness noted in chest wall. Cardiovascular system: S1 & S2 heard, RRR. No JVD, murmurs, rubs, gallops or clicks. No  pedal edema. Gastrointestinal system: Abdomen is nondistended, soft and nontender. No organomegaly or masses felt. Normal bowel sounds heard. Central nervous system: Alert and oriented. No focal neurological deficits. Extremities:  Left hip tenderness+ ROM: reduced. Skin: No rashes, lesions or ulcers Psychiatry: Judgement and insight appear normal. Mood & affect appropriate.     Data Reviewed: I have personally reviewed following labs and imaging studies  CBC: Recent Labs  Lab 04/08/20 1325 04/10/20 0203 04/10/20 1043 04/11/20 0824 04/11/20 1150  WBC 11.9* 12.9*  --  9.2  --   NEUTROABS 10.0*  --   --   --   --   HGB 9.4* 8.7* 8.7* 8.5* 8.3*  HCT 29.6* 26.6* 27.7* 27.7* 26.4*  MCV 88.6 90.5  --  89.9  --   PLT 211 186  --  205  --    Basic Metabolic Panel: Recent Labs  Lab 04/08/20 1325  NA 137  K 3.8  CL 100  CO2 26  GLUCOSE 98  BUN 13  CREATININE 0.94  CALCIUM 9.2   GFR: Estimated Creatinine Clearance: 33.7 mL/min (by C-G formula based on SCr of 0.94 mg/dL). Liver Function Tests: No results for input(s): AST, ALT, ALKPHOS, BILITOT, PROT, ALBUMIN in the last 168 hours. No results for input(s): LIPASE, AMYLASE in the last 168 hours. No results for input(s): AMMONIA in the last 168 hours. Coagulation Profile: No results for input(s): INR, PROTIME in the last 168 hours. Cardiac Enzymes: No results for input(s): CKTOTAL, CKMB, CKMBINDEX, TROPONINI in the last 168 hours. BNP (last 3 results) No results for input(s): PROBNP in the last 8760 hours. HbA1C: No results for input(s): HGBA1C in the last 72 hours. CBG: No results for input(s): GLUCAP in the last 168 hours. Lipid Profile: No results for input(s): CHOL, HDL, LDLCALC, TRIG, CHOLHDL, LDLDIRECT in the last 72 hours. Thyroid Function Tests: No results for input(s): TSH, T4TOTAL, FREET4, T3FREE, THYROIDAB in the last 72 hours. Anemia Panel: No results for input(s): VITAMINB12, FOLATE, FERRITIN, TIBC, IRON,  RETICCTPCT in the last 72 hours. Sepsis Labs: No results for input(s): PROCALCITON, LATICACIDVEN in the last 168 hours.  Recent Results (from the past 240 hour(s))  SARS Coronavirus 2 by RT PCR (hospital order, performed in [facility] hospital lab) Nasopharyngeal Nasopharyngeal Swab     Status: None   Collection Time: 04/08/20  3:05 PM   Specimen: Nasopharyngeal Swab  Result Value Ref Range Status   SARS Coronavirus 2 NEGATIVE NEGATIVE Final    Comment: (NOTE) SARS-CoV-2 target nucleic acids are NOT DETECTED.  The SARS-CoV-2 RNA is generally detectable in upper and lower respiratory specimens during the acute phase of infection. The lowest concentration of SARS-CoV-2 viral copies this assay can detect is 250 copies / mL. A negative result does not preclude SARS-CoV-2 infection and should not be used as the sole  basis for treatment or other patient management decisions.  A negative result may occur with improper specimen collection / handling, submission of specimen other than nasopharyngeal swab, presence of viral mutation(s) within the areas targeted by this assay, and inadequate number of viral copies (<250 copies / mL). A negative result must be combined with clinical observations, patient history, and epidemiological information.  Fact Sheet for Patients:   StrictlyIdeas.no  Fact Sheet for Healthcare Providers: BankingDealers.co.za  This test is not yet approved or  cleared by the [place] FDA and has been authorized for detection and/or diagnosis of SARS-CoV-2 by FDA under an Emergency Use Authorization (EUA).  This EUA will remain in effect (meaning this test can be used) for the duration of the COVID-19 declaration under Section 564(b)(1) of the Act, 21 U.S.C. section 360bbb-3(b)(1), unless the authorization is terminated or revoked sooner.  Performed at [facility], [place] [address], [place],  [address]       Radiology Studies: No results found.   Scheduled Meds: . arformoterol  15 mcg Nebulization BID  . aspirin EC  81 mg Oral Daily  . doxazosin  8 mg Oral Daily  . enoxaparin (LOVENOX) injection  20 mg Subcutaneous Q24H  . levothyroxine  50 mcg Oral Q0600  . metoprolol tartrate  25 mg Oral Daily  . multivitamin with minerals  1 tablet Oral Daily  . nicotine  14 mg Transdermal Daily  . pantoprazole  40 mg Oral Daily  . rosuvastatin  10 mg Oral Daily  . tamoxifen  20 mg Oral Daily  . umeclidinium bromide  1 puff Inhalation Daily   Continuous Infusions:   LOS: 2 days    Time spent: 25 mins.    Shawna Clamp, MD [facility]   If 7PM-7AM, please contact night-coverage

## 2020-04-11 NOTE — TOC Progression Note (Signed)
Transition of Care [facility]) - Progression Note    Patient Details  Name: Kendra Watts MRN: [id number] Date of Birth: Jan 17, 1946  Transition of Care [facility]) CM/SW Contact  Bartholomew Crews, RN Phone Number: [phone] 04/11/2020, 12:02 PM  Clinical Narrative:     Notified by MD of patient's readiness to transition to SNF. Spoke with Janie at [facility] about bed offer. [facility] can accept patient tomorrow morning. Will need covid test. MD notified. Spoke with patient on her room phone to discuss transition tomorrow morning. Patient to sign herself in. TOC following for transition needs.   Expected Discharge Plan: Skilled Nursing Facility Barriers to Discharge: Continued Medical Work up  Expected Discharge Plan and Services Expected Discharge Plan: [place] In-house Referral: Clinical Social Work Discharge Planning Services: CM Consult Post Acute Care Choice: [place] Living arrangements for the past 2 months: Apartment                                       Social Determinants of Health (SDOH) Interventions    Readmission Risk Interventions Readmission Risk Prevention Plan 03/16/2020  Transportation Screening Complete  PCP or Specialist Appt within 3-5 Days Complete  HRI or Home Care Consult Complete  Social Work Consult for [place] Planning/Counseling Complete  Palliative Care Screening Not Applicable  Medication Review [employer]) Complete  Some recent data might be hidden

## 2020-04-11 NOTE — Plan of Care (Signed)
  Problem: Education: Goal: Knowledge of General Education information will improve Description: Including pain rating scale, medication(s)/side effects and non-pharmacologic comfort measures Outcome: Progressing   Problem: Education: Goal: Knowledge of General Education information will improve Description: Including pain rating scale, medication(s)/side effects and non-pharmacologic comfort measures Outcome: Progressing   Problem: Clinical Measurements: Goal: Ability to maintain clinical measurements within normal limits will improve Outcome: Progressing Goal: Will remain free from infection Outcome: Progressing Goal: Diagnostic test results will improve Outcome: Progressing Goal: Respiratory complications will improve Outcome: Progressing Goal: Cardiovascular complication will be avoided Outcome: Progressing

## 2020-04-11 NOTE — TOC CAGE-AID Note (Signed)
Transition of Care [facility]) - CAGE-AID Screening   Patient Details  Name: Kendra Watts MRN: [id number] Date of Birth: 04-28-1946  [facility]) CM/SW Contact:    Emeterio Reeve, [place] Phone Number: 04/11/2020, 1:41 PM   Clinical Narrative:  CSW met with pt at bedside. CSW introduced self and explained her role at the hospital.  PT denies alcohol use and substance use. Pt did not need any resources at this time.    CAGE-AID Screening:    Have You Ever Felt You Ought to Cut Down on Your Drinking or Drug Use?: No Have People Annoyed You By Critizing Your Drinking Or Drug Use?: No Have You Felt Bad Or Guilty About Your Drinking Or Drug Use?: No Have You Ever Had a Drink or Used Drugs First Thing In The Morning to Steady Your Nerves or to Get Rid of a Hangover?: No CAGE-AID Score: 0  Substance Abuse Education Offered: Yes    Blima Ledger, [place] Social Worker [phone]

## 2020-04-12 ENCOUNTER — Encounter (HOSPITAL_COMMUNITY): Payer: Self-pay | Admitting: Internal Medicine

## 2020-04-12 DIAGNOSIS — J9622 Acute and chronic respiratory failure with hypercapnia: Secondary | ICD-10-CM | POA: Diagnosis not present

## 2020-04-12 DIAGNOSIS — J90 Pleural effusion, not elsewhere classified: Secondary | ICD-10-CM | POA: Diagnosis not present

## 2020-04-12 DIAGNOSIS — C50212 Malignant neoplasm of upper-inner quadrant of left female breast: Secondary | ICD-10-CM | POA: Diagnosis not present

## 2020-04-12 DIAGNOSIS — R4182 Altered mental status, unspecified: Secondary | ICD-10-CM | POA: Diagnosis not present

## 2020-04-12 DIAGNOSIS — C50412 Malignant neoplasm of upper-outer quadrant of left female breast: Secondary | ICD-10-CM | POA: Diagnosis not present

## 2020-04-12 DIAGNOSIS — R2689 Other abnormalities of gait and mobility: Secondary | ICD-10-CM | POA: Diagnosis not present

## 2020-04-12 DIAGNOSIS — S7292XA Unspecified fracture of left femur, initial encounter for closed fracture: Secondary | ICD-10-CM | POA: Diagnosis not present

## 2020-04-12 DIAGNOSIS — R040 Epistaxis: Secondary | ICD-10-CM | POA: Diagnosis present

## 2020-04-12 DIAGNOSIS — I5021 Acute systolic (congestive) heart failure: Secondary | ICD-10-CM | POA: Diagnosis not present

## 2020-04-12 DIAGNOSIS — C3491 Malignant neoplasm of unspecified part of right bronchus or lung: Secondary | ICD-10-CM | POA: Diagnosis not present

## 2020-04-12 DIAGNOSIS — J9611 Chronic respiratory failure with hypoxia: Secondary | ICD-10-CM | POA: Diagnosis not present

## 2020-04-12 DIAGNOSIS — J441 Chronic obstructive pulmonary disease with (acute) exacerbation: Secondary | ICD-10-CM | POA: Diagnosis present

## 2020-04-12 DIAGNOSIS — I959 Hypotension, unspecified: Secondary | ICD-10-CM | POA: Diagnosis present

## 2020-04-12 DIAGNOSIS — I5033 Acute on chronic diastolic (congestive) heart failure: Secondary | ICD-10-CM | POA: Diagnosis present

## 2020-04-12 DIAGNOSIS — E1165 Type 2 diabetes mellitus with hyperglycemia: Secondary | ICD-10-CM | POA: Diagnosis not present

## 2020-04-12 DIAGNOSIS — N390 Urinary tract infection, site not specified: Secondary | ICD-10-CM | POA: Diagnosis not present

## 2020-04-12 DIAGNOSIS — Z209 Contact with and (suspected) exposure to unspecified communicable disease: Secondary | ICD-10-CM | POA: Diagnosis not present

## 2020-04-12 DIAGNOSIS — M810 Age-related osteoporosis without current pathological fracture: Secondary | ICD-10-CM | POA: Diagnosis not present

## 2020-04-12 DIAGNOSIS — R682 Dry mouth, unspecified: Secondary | ICD-10-CM | POA: Diagnosis not present

## 2020-04-12 DIAGNOSIS — I509 Heart failure, unspecified: Secondary | ICD-10-CM | POA: Diagnosis not present

## 2020-04-12 DIAGNOSIS — J810 Acute pulmonary edema: Secondary | ICD-10-CM | POA: Diagnosis not present

## 2020-04-12 DIAGNOSIS — J811 Chronic pulmonary edema: Secondary | ICD-10-CM | POA: Diagnosis not present

## 2020-04-12 DIAGNOSIS — S7292XD Unspecified fracture of left femur, subsequent encounter for closed fracture with routine healing: Secondary | ICD-10-CM | POA: Diagnosis not present

## 2020-04-12 DIAGNOSIS — I469 Cardiac arrest, cause unspecified: Secondary | ICD-10-CM | POA: Diagnosis not present

## 2020-04-12 DIAGNOSIS — I739 Peripheral vascular disease, unspecified: Secondary | ICD-10-CM | POA: Diagnosis not present

## 2020-04-12 DIAGNOSIS — M2550 Pain in unspecified joint: Secondary | ICD-10-CM | POA: Diagnosis not present

## 2020-04-12 DIAGNOSIS — E785 Hyperlipidemia, unspecified: Secondary | ICD-10-CM | POA: Diagnosis present

## 2020-04-12 DIAGNOSIS — Q278 Other specified congenital malformations of peripheral vascular system: Secondary | ICD-10-CM | POA: Diagnosis not present

## 2020-04-12 DIAGNOSIS — S72115S Nondisplaced fracture of greater trochanter of left femur, sequela: Secondary | ICD-10-CM | POA: Diagnosis not present

## 2020-04-12 DIAGNOSIS — Z170 Estrogen receptor positive status [ER+]: Secondary | ICD-10-CM | POA: Diagnosis not present

## 2020-04-12 DIAGNOSIS — S79929A Unspecified injury of unspecified thigh, initial encounter: Secondary | ICD-10-CM | POA: Diagnosis not present

## 2020-04-12 DIAGNOSIS — I701 Atherosclerosis of renal artery: Secondary | ICD-10-CM | POA: Diagnosis present

## 2020-04-12 DIAGNOSIS — I517 Cardiomegaly: Secondary | ICD-10-CM | POA: Diagnosis not present

## 2020-04-12 DIAGNOSIS — R4020 Unspecified coma: Secondary | ICD-10-CM | POA: Diagnosis not present

## 2020-04-12 DIAGNOSIS — R55 Syncope and collapse: Secondary | ICD-10-CM | POA: Diagnosis not present

## 2020-04-12 DIAGNOSIS — J432 Centrilobular emphysema: Secondary | ICD-10-CM | POA: Diagnosis not present

## 2020-04-12 DIAGNOSIS — J9610 Chronic respiratory failure, unspecified whether with hypoxia or hypercapnia: Secondary | ICD-10-CM | POA: Diagnosis not present

## 2020-04-12 DIAGNOSIS — I10 Essential (primary) hypertension: Secondary | ICD-10-CM | POA: Diagnosis not present

## 2020-04-12 DIAGNOSIS — F419 Anxiety disorder, unspecified: Secondary | ICD-10-CM | POA: Diagnosis present

## 2020-04-12 DIAGNOSIS — Z85118 Personal history of other malignant neoplasm of bronchus and lung: Secondary | ICD-10-CM | POA: Diagnosis not present

## 2020-04-12 DIAGNOSIS — J9811 Atelectasis: Secondary | ICD-10-CM | POA: Diagnosis present

## 2020-04-12 DIAGNOSIS — K219 Gastro-esophageal reflux disease without esophagitis: Secondary | ICD-10-CM | POA: Diagnosis not present

## 2020-04-12 DIAGNOSIS — C3492 Malignant neoplasm of unspecified part of left bronchus or lung: Secondary | ICD-10-CM | POA: Diagnosis not present

## 2020-04-12 DIAGNOSIS — E782 Mixed hyperlipidemia: Secondary | ICD-10-CM | POA: Diagnosis not present

## 2020-04-12 DIAGNOSIS — R234 Changes in skin texture: Secondary | ICD-10-CM | POA: Diagnosis present

## 2020-04-12 DIAGNOSIS — E871 Hypo-osmolality and hyponatremia: Secondary | ICD-10-CM | POA: Diagnosis not present

## 2020-04-12 DIAGNOSIS — Z20828 Contact with and (suspected) exposure to other viral communicable diseases: Secondary | ICD-10-CM | POA: Diagnosis not present

## 2020-04-12 DIAGNOSIS — W19XXXA Unspecified fall, initial encounter: Secondary | ICD-10-CM | POA: Diagnosis present

## 2020-04-12 DIAGNOSIS — J9602 Acute respiratory failure with hypercapnia: Secondary | ICD-10-CM | POA: Diagnosis present

## 2020-04-12 DIAGNOSIS — N289 Disorder of kidney and ureter, unspecified: Secondary | ICD-10-CM | POA: Diagnosis present

## 2020-04-12 DIAGNOSIS — J189 Pneumonia, unspecified organism: Secondary | ICD-10-CM | POA: Diagnosis present

## 2020-04-12 DIAGNOSIS — J440 Chronic obstructive pulmonary disease with acute lower respiratory infection: Secondary | ICD-10-CM | POA: Diagnosis present

## 2020-04-12 DIAGNOSIS — H269 Unspecified cataract: Secondary | ICD-10-CM | POA: Diagnosis present

## 2020-04-12 DIAGNOSIS — E039 Hypothyroidism, unspecified: Secondary | ICD-10-CM | POA: Diagnosis present

## 2020-04-12 DIAGNOSIS — Z8249 Family history of ischemic heart disease and other diseases of the circulatory system: Secondary | ICD-10-CM | POA: Diagnosis not present

## 2020-04-12 DIAGNOSIS — J181 Lobar pneumonia, unspecified organism: Secondary | ICD-10-CM | POA: Diagnosis not present

## 2020-04-12 DIAGNOSIS — D62 Acute posthemorrhagic anemia: Secondary | ICD-10-CM | POA: Diagnosis present

## 2020-04-12 DIAGNOSIS — Z7401 Bed confinement status: Secondary | ICD-10-CM | POA: Diagnosis not present

## 2020-04-12 DIAGNOSIS — S72112A Displaced fracture of greater trochanter of left femur, initial encounter for closed fracture: Secondary | ICD-10-CM | POA: Diagnosis not present

## 2020-04-12 DIAGNOSIS — M6281 Muscle weakness (generalized): Secondary | ICD-10-CM | POA: Diagnosis not present

## 2020-04-12 DIAGNOSIS — R2681 Unsteadiness on feet: Secondary | ICD-10-CM | POA: Diagnosis not present

## 2020-04-12 DIAGNOSIS — D649 Anemia, unspecified: Secondary | ICD-10-CM | POA: Diagnosis not present

## 2020-04-12 DIAGNOSIS — S2242XA Multiple fractures of ribs, left side, initial encounter for closed fracture: Secondary | ICD-10-CM | POA: Diagnosis present

## 2020-04-12 DIAGNOSIS — I110 Hypertensive heart disease with heart failure: Secondary | ICD-10-CM | POA: Diagnosis present

## 2020-04-12 DIAGNOSIS — J9621 Acute and chronic respiratory failure with hypoxia: Secondary | ICD-10-CM | POA: Diagnosis present

## 2020-04-12 DIAGNOSIS — R0602 Shortness of breath: Secondary | ICD-10-CM | POA: Diagnosis not present

## 2020-04-12 DIAGNOSIS — C3490 Malignant neoplasm of unspecified part of unspecified bronchus or lung: Secondary | ICD-10-CM | POA: Diagnosis not present

## 2020-04-12 DIAGNOSIS — S2232XD Fracture of one rib, left side, subsequent encounter for fracture with routine healing: Secondary | ICD-10-CM | POA: Diagnosis not present

## 2020-04-12 DIAGNOSIS — R0902 Hypoxemia: Secondary | ICD-10-CM | POA: Diagnosis not present

## 2020-04-12 DIAGNOSIS — Y92129 Unspecified place in nursing home as the place of occurrence of the external cause: Secondary | ICD-10-CM | POA: Diagnosis not present

## 2020-04-12 DIAGNOSIS — R918 Other nonspecific abnormal finding of lung field: Secondary | ICD-10-CM | POA: Diagnosis not present

## 2020-04-12 DIAGNOSIS — I6529 Occlusion and stenosis of unspecified carotid artery: Secondary | ICD-10-CM | POA: Diagnosis not present

## 2020-04-12 DIAGNOSIS — Z20822 Contact with and (suspected) exposure to covid-19: Secondary | ICD-10-CM | POA: Diagnosis present

## 2020-04-12 DIAGNOSIS — M6259 Muscle wasting and atrophy, not elsewhere classified, multiple sites: Secondary | ICD-10-CM | POA: Diagnosis not present

## 2020-04-12 DIAGNOSIS — R52 Pain, unspecified: Secondary | ICD-10-CM | POA: Diagnosis not present

## 2020-04-12 DIAGNOSIS — J449 Chronic obstructive pulmonary disease, unspecified: Secondary | ICD-10-CM | POA: Diagnosis not present

## 2020-04-12 DIAGNOSIS — J9612 Chronic respiratory failure with hypercapnia: Secondary | ICD-10-CM | POA: Diagnosis not present

## 2020-04-12 DIAGNOSIS — Z923 Personal history of irradiation: Secondary | ICD-10-CM | POA: Diagnosis not present

## 2020-04-12 LAB — BASIC METABOLIC PANEL
Anion gap: 7 (ref 5–15)
BUN: 9 mg/dL (ref 8–23)
CO2: 28 mmol/L (ref 22–32)
Calcium: 8.3 mg/dL — ABNORMAL LOW (ref 8.9–10.3)
Chloride: 100 mmol/L (ref 98–111)
Creatinine, Ser: 0.86 mg/dL (ref 0.44–1.00)
GFR calc Af Amer: >60 mL/min (ref >60)
GFR calc non Af Amer: >60 mL/min (ref >60)
Glucose, Bld: 133 mg/dL — ABNORMAL HIGH (ref 70–99)
Potassium: 3.7 mmol/L (ref 3.5–5.1)
Sodium: 135 mmol/L (ref 135–145)

## 2020-04-12 LAB — CBC
HCT: 26 % — ABNORMAL LOW (ref 36.0–46.0)
Hemoglobin: 8 g/dL — ABNORMAL LOW (ref 12.0–15.0)
MCH: 28 pg (ref 26.0–34.0)
MCHC: 30.8 g/dL (ref 30.0–36.0)
MCV: 90.9 fL (ref 80.0–100.0)
Platelets: 198 10*3/uL (ref 150–400)
RBC: 2.86 MIL/uL — ABNORMAL LOW (ref 3.87–5.11)
RDW: 16.2 % — ABNORMAL HIGH (ref 11.5–15.5)
WBC: 7.8 10*3/uL (ref 4.0–10.5)
nRBC: 0 % (ref 0.0–0.2)

## 2020-04-12 NOTE — Discharge Instructions (Signed)
Advised to follow-up with primary care physician in 1 week. Advised to follow-up with orthopedics in 4 weeks.   Advised to continue physical therapy is recommended. Advised to take pain medications as prescribed

## 2020-04-12 NOTE — Discharge Summary (Addendum)
Physician Discharge Summary  Kendra Watts:[id number] DOB: 29-Nov-1945 DOA: 04/08/2020  PCP: Merrilee Seashore, MD  Admit date: 04/08/2020.  Discharge date: 04/12/2020.  Admitted From:  Home.  Disposition: [facility] skilled nursing facility  Recommendations for Outpatient Follow-up:  1. Follow up with PCP in 1-2 weeks. 2. Please obtain BMP/CBC in one week. 3. Advised to follow-up with orthopedics in 4 weeks.   4. Advised to continue physical therapy as recommended. 5. Advised to take pain medications as prescribed.  Home Health: None. Equipment/Devices: ATFTDD@ 2-4L/M  Discharge Condition: Stable. CODE STATUS:Full code Diet recommendation: Heart Healthy   Brief Summary / Hospital course: 74 years old female with medical history significant for breast cancer, NSCLC status post radiation, in remission /very small tumor burden followed closely by Dr. Sondra Come, COPD on chronic home oxygen @ 4 L.  Presents in the emergency department status post mechanical fall with left femur fracture( greater trochanter, Left 3rd rib fracture).  Ortho consulted recommended non-operative management.  She was recently admitted last month with acute blood loss anemia due to recurrent nosebleeds,  required transfusion. She was discharged to skilled nursing facility and discharged back home on 9/16.  She had a mechanical fall on morning 9/18.   PT/ OT  Recommended SNF.  Pain control,  weightbearing as tolerated. There were new nodules noted on the CT chest, Report reviewed and discussed with Dr. Sondra Come, He advised repeat CT scan in 3 months.  Patient will follow up with Dr. Sondra Come as outpatient.  Ortho recommendation: Nonoperative management.  Weightbearing as tolerated on left lower extremity.  Encourage deep breathing exercises for rib fracture,  incentive spirometry,  pain management.  follow-up in 1 month for repeat imaging.  She was managed for below problems.  Discharge Diagnoses:  Principal  Problem:   Fracture of greater trochanter of left femur (HCC) Active Problems:   Essential hypertension   Chronic respiratory failure with hypoxia (HCC)   Multiple lung nodules   Chronic obstructive pulmonary disease (HCC)   Squamous cell lung cancer, left (HCC)   Adenocarcinoma, lung, right (HCC)   Fracture of one rib, left side, initial encounter for closed fracture   Closed fracture of greater trochanter of left femur (HCC)  Mechanical fall with Fx of greater troch of L femur Ortho consulted , recommended non operative management. PT/OT evaluation recommended SNF SW eval and probably SNF vs CIR placement. Pain control: tylenol, ibuprofen, and norco PRN. WBAT as per ortho recommendations. Patient wants to go back to [facility] home.  Left 3rd rib fx - conservative management. Incentive spirometry. Pain Control.  HTN - controlled  .Cont home BP meds.  COPD - Not in exacerbation: Cont home nebs, Cont O2 via [place]  Multiple lung nodules, h/o multiple NSCLCs - mostly stable findings Though has new 0.27mm pulm nodule and 2x ~2cm lymph nodes in pelvis Discussed with Dr. Sondra Come,  he wants to repeat CT scan in 3 months.   Normocytic / Normochromic Anemia :  Hb at admission 10.1, trended down to 8.3, no obvious bleeding. Stool for occult blood negative twice.  Discharge Instructions  Discharge Instructions    Call MD for:  difficulty breathing, headache or visual disturbances   Complete by: As directed    Call MD for:  persistant dizziness or light-headedness   Complete by: As directed    Call MD for:  persistant nausea and vomiting   Complete by: As directed    Diet - low sodium heart healthy   Complete  by: As directed    Diet Carb Modified   Complete by: As directed    Discharge instructions   Complete by: As directed    Advised to follow-up with primary care physician in 1 week. Advised to follow-up with orthopedics in 4 weeks.   Advised to continue  physical therapy is recommended. Advised to take pain medications as prescribed   Increase activity slowly   Complete by: As directed      Allergies as of 04/12/2020   No Known Allergies     Medication List    TAKE these medications   acetaminophen 325 MG tablet Commonly known as: TYLENOL Take 325 mg by mouth every 6 (six) hours as needed for mild pain or moderate pain.   albuterol 108 (90 Base) MCG/ACT inhaler Commonly known as: VENTOLIN HFA Inhale 2 puffs into the lungs every 6 (six) hours as needed for wheezing or shortness of breath.   aspirin 81 MG tablet Take 1 tablet (81 mg total) by mouth daily.   CALCIUM 1200+D3 PO Take 1 tablet by mouth daily.   Cardura 8 MG tablet Generic drug: doxazosin Take 8 mg by mouth daily.   Crestor 10 MG tablet Generic drug: rosuvastatin Take 10 mg by mouth daily.   furosemide 20 MG tablet Commonly known as: Lasix Take 1 tablet (20 mg total) by mouth daily.   ibandronate 150 MG tablet Commonly known as: BONIVA Take 150 mg by mouth every 30 (thirty) days.   levothyroxine 50 MCG tablet Commonly known as: SYNTHROID Take 50 mcg by mouth daily.   metoprolol tartrate 25 MG tablet Commonly known as: LOPRESSOR Take 25 mg by mouth daily.   multivitamin with minerals tablet Take 1 tablet by mouth daily.   nicotine 14 mg/24hr patch Commonly known as: NICODERM CQ - dosed in mg/24 hours Place 1 patch (14 mg total) onto the skin daily.   pantoprazole 40 MG tablet Commonly known as: PROTONIX Take 1 tablet (40 mg total) by mouth daily.   Stiolto Respimat 2.5-2.5 MCG/ACT Aers Generic drug: Tiotropium Bromide-Olodaterol Inhale 2 puffs into the lungs daily.   tamoxifen 20 MG tablet Commonly known as: NOLVADEX Take 1 tablet (20 mg total) by mouth daily.       Contact information for follow-up providers    Merrilee Seashore, MD Follow up in 1 week(s).   Specialty: Internal Medicine Contact information: [address]  [place] [address] [phone]        Ethelda Chick, PA-C Follow up in 4 week(s).   Specialty: Orthopedic Surgery Contact information: [place] [address] [phone]            Contact information for after-discharge care    Destination    [facility] .   Service: Skilled Nursing Contact information: [place] [phone]                 No Known Allergies  Consultations:  Orthopaedics.   Procedures/Studies: DG Chest 2 View  Result Date: 04/08/2020 CLINICAL DATA:  Fall EXAM: CHEST - 2 VIEW COMPARISON:  March 13, 2020 FINDINGS: The cardiomediastinal silhouette is unchanged in contour.Surgical clips project over the RIGHT neck and LEFT thorax. Vascular calcifications. Tortuous thoracic aorta. No pleural effusion. No pneumothorax. No acute pleuroparenchymal abnormality. Visualized abdomen is unremarkable. Mildly displaced LEFT lateral third and fourth rib fractures. IMPRESSION: Mildly displaced LEFT third and fourth rib fractures. No pneumothorax. Electronically  Signed   By: Valentino Saxon MD   On: 04/08/2020 14:59   DG Lumbar Spine Complete  Result Date: 04/08/2020 CLINICAL DATA:  Fall EXAM: LUMBAR SPINE - COMPLETE 4+ VIEW COMPARISON:  None. FINDINGS: There are five non-rib bearing lumbar-type vertebral bodies. No spondylolisthesis. There is levocurvature of the mid lumbar spine. There is no evidence for acute fracture or subluxation. Intervertebral disc space height loss is moderate at L2-3 with multilevel endplate proliferative changes. Mild lower lumbar facet arthropathy. Vascular calcifications. Renal artery stents. IMPRESSION: 1. No acute fracture or subluxation of the lumbar spine. 2. Multilevel degenerative disc disease, most pronounced at L2-3. Electronically Signed   By: Valentino Saxon MD   On: 04/08/2020 15:02   DG Pelvis 1-2  Views  Result Date: 04/08/2020 CLINICAL DATA:  Fall EXAM: PELVIS - 1-2 VIEW; LEFT FEMUR 2 VIEWS COMPARISON:  None. FINDINGS: There is a comminuted of LEFT greater trochanteric fracture. There is minimal displacement of the tip of the greater trochanter. No definite extension through the femoral neck but evaluation is limited secondary to osteopenia. Vascular calcifications. No pelvic diastasis. Mild degenerative changes of the lower lumbar spine. Limited assessment of the contralateral hip is unremarkable IMPRESSION: Comminuted of the LEFT greater trochanteric fracture with minimal displacement of the tip of the greater trochanter. No definite extension through the femoral neck but evaluation is limited secondary to osteopenia. Electronically Signed   By: Valentino Saxon MD   On: 04/08/2020 14:55   DG Tibia/Fibula Left  Result Date: 04/08/2020 CLINICAL DATA:  Fall EXAM: LEFT TIBIA AND FIBULA - 2 VIEW COMPARISON:  None. FINDINGS: Osteopenia. No acute fracture or dislocation. Joint spaces and alignment are maintained. No area of erosion or osseous destruction. No unexpected radiopaque foreign body. Vascular calcifications. IMPRESSION: 1. No acute osseous abnormality in the left tibia or fibula. Electronically Signed   By: Valentino Saxon MD   On: 04/08/2020 15:04   CT Head Wo Contrast  Result Date: 04/08/2020 CLINICAL DATA:  Fall with LEFT hip pain EXAM: CT HEAD WITHOUT CONTRAST CT CERVICAL SPINE WITHOUT CONTRAST TECHNIQUE: Multidetector CT imaging of the head and cervical spine was performed following the standard protocol without intravenous contrast. Multiplanar CT image reconstructions of the cervical spine were also generated. COMPARISON:  September 05, 2019. FINDINGS: CT HEAD FINDINGS Brain: No evidence of acute infarction, hemorrhage, hydrocephalus, extra-axial collection or mass lesion/mass effect. Periventricular white matter hypodensities consistent with sequela of chronic microvascular  ischemic disease. Mildly advanced global volume loss for age. Vascular: Vascular calcifications of bilateral carotid siphons and vertebral arteries. Skull: No acute skull fracture.  Osteopenia. Sinuses/Orbits: Mucosal thickening of the RIGHT greater than LEFT maxillary sinus. Mucosal thickening of the RIGHT sphenoid sinus. Other: RIGHT ear cerumen. CT CERVICAL SPINE FINDINGS Alignment: Exaggerated lordosis, unchanged. No spondylolisthesis or traumatic subluxation. Skull base and vertebrae: No acute fracture. No primary bone lesion or focal pathologic process. Incomplete fusion of the posterior arch of C1. Osteopenia. Soft tissues and spinal canal: No prevertebral fluid or swelling. No visible canal hematoma. Disc levels: Intervertebral disc space height loss at C4-5. Multilevel facet arthropathy. Upper chest: Revisualization of an irregular RIGHT upper lobe pulmonary nodule measuring approximately 8 by 11 mm, similar in comparison to prior. Adjacent fiducial marker. Paraseptal emphysema. Other: Surgical clips in the RIGHT neck status post endarterectomy. Severe LEFT carotid atherosclerotic calcifications. IMPRESSION: 1.  No acute intracranial abnormality. 2.  No acute fracture or static subluxation of the cervical spine. Emphysema (ICD10-J43.9). Electronically Signed   By:  Valentino Saxon MD   On: 04/08/2020 14:04   CT Chest W Contrast  Result Date: 04/08/2020 CLINICAL DATA:  Status post trauma. EXAM: CT CHEST, ABDOMEN, AND PELVIS WITH CONTRAST TECHNIQUE: Multidetector CT imaging of the chest, abdomen and pelvis was performed following the standard protocol during bolus administration of intravenous contrast. CONTRAST:  172mL OMNIPAQUE IOHEXOL 300 MG/ML  SOLN COMPARISON:  None. November 15, 2019 FINDINGS: CT CHEST FINDINGS Cardiovascular: There is marked severity calcification of the thoracic aorta and great vessels. Normal heart size with marked severity coronary artery calcification. No pericardial effusion.  Mediastinum/Nodes: No enlarged mediastinal, hilar, or axillary lymph nodes. Thyroid gland, trachea, and esophagus demonstrate no significant findings. Lungs/Pleura: There is moderate severity emphysematous lung disease. A stable 1.0 cm x 0.7 cm noncalcified lung nodule is seen within the posteromedial aspect of the right apex. Small metallic density surgical material is also seen within this region. Stable moderate severity scarring and/or atelectasis is seen within the posterior aspect of the left upper lobe. A stable 1.6 cm x 0.7 cm noncalcified nodular component is seen (axial CT image 37, CT series number 5). Small metallic density surgical material is also seen within this area. A stable 2 mm posteromedial left lower lobe noncalcified lung nodule is seen (axial CT image 91, CT series number 5). A 0.6 cm pleural based noncalcified lung nodule is seen within the posterior aspect of the left lower lobe (axial CT image 102, CT series number 5). This represents a new finding when compared to the prior exam. A stable 0.9 cm x 0.6 cm mildly lobulated noncalcified lung nodule is seen within the posterolateral aspect of the right lower lobe (axial CT image 103, CT series number 5). Stable mild pleural thickening is seen along the posterior aspect of the left lung base. This represents a new finding when compared to the prior study. Musculoskeletal: An acute lateral third left rib fracture is seen. A stable lateral fourth left rib fracture is noted. A chronic compression fracture deformity is seen involving the T3 vertebral body. Interval decrease in vertebral body height is seen since the prior study. CT ABDOMEN PELVIS FINDINGS Hepatobiliary: No focal liver abnormality is seen. No gallstones, gallbladder wall thickening, or biliary dilatation. Pancreas: Unremarkable. No pancreatic ductal dilatation or surrounding inflammatory changes. Spleen: Normal in size without focal abnormality. Adrenals/Urinary Tract: Adrenal  glands are unremarkable. The left kidney is atrophic in appearance. Compensatory hypertrophy of the right kidney is seen. Subcentimeter cysts are noted within the upper pole of the left kidney. There is no evidence of renal calculi or hydronephrosis. The urinary bladder is moderately distended and otherwise normal in appearance. Stomach/Bowel: Stomach is within normal limits. The appendix is not clearly identified. No evidence of bowel wall thickening, distention, or inflammatory changes. Vascular/Lymphatic: There is marked severity calcification and tortuosity of the abdominal aorta and bilateral common iliac arteries, without evidence of aneurysmal dilatation. A 1.9 cm x 1.5 cm lymph node is suspected along the medial aspect of the mid to lower right pelvis. A similar appearing 2.1 cm x 1.4 cm area is noted within this region on the left. Reproductive: Status post hysterectomy. No adnexal masses. Other: No abdominal wall hernia or abnormality. No abdominopelvic ascites. Musculoskeletal: Acute fracture deformity is seen involving the greater trochanter of the proximal left femur. Marked severity degenerative changes seen at the level of L2-L3. IMPRESSION: 1. Acute lateral third left rib fracture with a stable lateral fourth left rib fracture. 2. Acute fracture of the greater  trochanter of the proximal left femur. 3. Stable noncalcified lung nodules within the bilateral lungs, as described above with stable posterior left upper lobe scarring and/or atelectasis. 4. 6 mm pleural based noncalcified left lower lobe lung nodule which represents a new finding when compared to the prior exam. Three-month follow-up chest CT is recommended to determine stability. 5. Moderate severity emphysematous lung disease. 6. Mild pleural thickening along the posterior aspect of the left lung base. This represents a new finding when compared to the prior study. Correlation with follow-up chest CT is again recommended to determine  stability and exclude the presence of an underlying neoplasm. 7. Chronic compression fracture deformity involving the T3 vertebral body with interval decrease in vertebral body height since the prior study. Aortic Atherosclerosis (ICD10-I70.0) and Emphysema (ICD10-J43.9). Electronically Signed   By: Virgina Norfolk M.D.   On: 04/08/2020 17:05   CT Cervical Spine Wo Contrast  Result Date: 04/08/2020 CLINICAL DATA:  Fall with LEFT hip pain EXAM: CT HEAD WITHOUT CONTRAST CT CERVICAL SPINE WITHOUT CONTRAST TECHNIQUE: Multidetector CT imaging of the head and cervical spine was performed following the standard protocol without intravenous contrast. Multiplanar CT image reconstructions of the cervical spine were also generated. COMPARISON:  September 05, 2019. FINDINGS: CT HEAD FINDINGS Brain: No evidence of acute infarction, hemorrhage, hydrocephalus, extra-axial collection or mass lesion/mass effect. Periventricular white matter hypodensities consistent with sequela of chronic microvascular ischemic disease. Mildly advanced global volume loss for age. Vascular: Vascular calcifications of bilateral carotid siphons and vertebral arteries. Skull: No acute skull fracture.  Osteopenia. Sinuses/Orbits: Mucosal thickening of the RIGHT greater than LEFT maxillary sinus. Mucosal thickening of the RIGHT sphenoid sinus. Other: RIGHT ear cerumen. CT CERVICAL SPINE FINDINGS Alignment: Exaggerated lordosis, unchanged. No spondylolisthesis or traumatic subluxation. Skull base and vertebrae: No acute fracture. No primary bone lesion or focal pathologic process. Incomplete fusion of the posterior arch of C1. Osteopenia. Soft tissues and spinal canal: No prevertebral fluid or swelling. No visible canal hematoma. Disc levels: Intervertebral disc space height loss at C4-5. Multilevel facet arthropathy. Upper chest: Revisualization of an irregular RIGHT upper lobe pulmonary nodule measuring approximately 8 by 11 mm, similar in  comparison to prior. Adjacent fiducial marker. Paraseptal emphysema. Other: Surgical clips in the RIGHT neck status post endarterectomy. Severe LEFT carotid atherosclerotic calcifications. IMPRESSION: 1.  No acute intracranial abnormality. 2.  No acute fracture or static subluxation of the cervical spine. Emphysema (ICD10-J43.9). Electronically Signed   By: Valentino Saxon MD   On: 04/08/2020 14:04   CT ABDOMEN PELVIS W CONTRAST  Result Date: 04/08/2020 CLINICAL DATA:  Status post trauma. EXAM: CT CHEST, ABDOMEN, AND PELVIS WITH CONTRAST TECHNIQUE: Multidetector CT imaging of the chest, abdomen and pelvis was performed following the standard protocol during bolus administration of intravenous contrast. CONTRAST:  157mL OMNIPAQUE IOHEXOL 300 MG/ML  SOLN COMPARISON:  None. FINDINGS: CT CHEST FINDINGS Cardiovascular: There is marked severity calcification of the thoracic aorta and great vessels. Normal heart size with marked severity coronary artery calcification. No pericardial effusion. Mediastinum/Nodes: No enlarged mediastinal, hilar, or axillary lymph nodes. Thyroid gland, trachea, and esophagus demonstrate no significant findings. Lungs/Pleura: There is moderate severity emphysematous lung disease. A stable 1.0 cm x 0.7 cm noncalcified lung nodule is seen within the posteromedial aspect of the right apex. Small metallic density surgical material is also seen within this region. Stable moderate severity scarring and/or atelectasis is seen within the posterior aspect of the left upper lobe. A stable 1.6 cm x 0.7 cm noncalcified  nodular component is seen (axial CT image 37, CT series number 5). Small metallic density surgical material is also seen within this area. A stable 2 mm posteromedial left lower lobe noncalcified lung nodule is seen (axial CT image 91, CT series number 5). A 0.6 cm pleural based noncalcified lung nodule is seen within the posterior aspect of the left lower lobe (axial CT image 102, CT  series number 5). This represents a new finding when compared to the prior exam. A stable 0.9 cm x 0.6 cm mildly lobulated noncalcified lung nodule is seen within the posterolateral aspect of the right lower lobe (axial CT image 103, CT series number 5). Stable mild pleural thickening is seen along the posterior aspect of the left lung base. This represents a new finding when compared to the prior study. Musculoskeletal: An acute lateral third left rib fracture is seen. A stable lateral fourth left rib fracture is noted. A chronic compression fracture deformity is seen involving the T3 vertebral body. Interval decrease in vertebral body height is seen since the prior study. CT ABDOMEN PELVIS FINDINGS Hepatobiliary: No focal liver abnormality is seen. No gallstones, gallbladder wall thickening, or biliary dilatation. Pancreas: Unremarkable. No pancreatic ductal dilatation or surrounding inflammatory changes. Spleen: Normal in size without focal abnormality. Adrenals/Urinary Tract: Adrenal glands are unremarkable. The left kidney is atrophic in appearance. Compensatory hypertrophy of the right kidney is seen. Subcentimeter cysts are noted within the upper pole of the left kidney. There is no evidence of renal calculi or hydronephrosis. The urinary bladder is moderately distended and otherwise normal in appearance. Stomach/Bowel: Stomach is within normal limits. The appendix is not clearly identified. No evidence of bowel wall thickening, distention, or inflammatory changes. Vascular/Lymphatic: There is marked severity calcification and tortuosity of the abdominal aorta and bilateral common iliac arteries, without evidence of aneurysmal dilatation. A 1.9 cm x 1.5 cm lymph node is suspected along the medial aspect of the mid to lower right pelvis. A similar appearing 2.1 cm x 1.4 cm area is noted within this region on the left. Reproductive: Status post hysterectomy. No adnexal masses. Other: No abdominal wall hernia or  abnormality. No abdominopelvic ascites. Musculoskeletal: Acute fracture deformity is seen involving the greater trochanter of the proximal left femur. Marked severity degenerative changes seen at the level of L2-L3. IMPRESSION: 1. Acute lateral third left rib fracture with a stable lateral fourth left rib fracture. 2. Acute fracture of the greater trochanter of the proximal left femur. 3. Stable noncalcified lung nodules within the bilateral lungs, as described above with stable posterior left upper lobe scarring and/or atelectasis. 4. 6 mm pleural based noncalcified left lower lobe lung nodule which represents a new finding when compared to the prior exam. Three-month follow-up chest CT is recommended to determine stability. 5. Moderate severity emphysematous lung disease. 6. Mild pleural thickening along the posterior aspect of the left lung base. This represents a new finding when compared to the prior study. Correlation with follow-up chest CT is again recommended to determine stability and exclude the presence of an underlying neoplasm. 7. Chronic compression fracture deformity involving the T3 vertebral body with interval decrease in vertebral body height since the prior study. Aortic Atherosclerosis (ICD10-I70.0) and Emphysema (ICD10-J43.9). Electronically Signed   By: Virgina Norfolk M.D.   On: 04/08/2020 17:05   DG Shoulder Left  Result Date: 04/08/2020 CLINICAL DATA:  Fall EXAM: LEFT SHOULDER - 2+ VIEW COMPARISON:  June 28, 2019 FINDINGS: Osteopenia. No acute fracture or dislocation of the  shoulder. There is a mildly displaced fracture of the LEFT third and likely fourth rib. Surgical clips project over the RIGHT neck and LEFT thorax. No pneumothorax. No unexpected radiopaque foreign body. Vascular calcifications. IMPRESSION: 1. No acute fracture or dislocation of the LEFT shoulder. 2. Mildly displaced fracture of the LEFT third and likely fourth rib. Electronically Signed   By: Valentino Saxon  MD   On: 04/08/2020 14:57   CT NO CHARGE  Result Date: 04/08/2020 CLINICAL DATA:  Status post trauma. EXAM: CT OF THE LEFT HIP WITHOUT CONTRAST TECHNIQUE: Multidetector CT imaging of the left hip was performed according to the standard protocol. Multiplanar CT image reconstructions were also generated. COMPARISON:  None. FINDINGS: Bones/Joint/Cartilage An acute comminuted fracture deformity is seen involving the greater trochanter of the proximal left femur. Mild extension to involve the adjacent inter trochanteric portion of the proximal left femur is seen. There is no evidence of dislocation. Ligaments Suboptimally assessed by CT. Muscles and Tendons Muscles and tendons are unremarkable. Soft tissues Mild soft tissue swelling is seen surrounding the previously noted fracture site with a mild amount of subcutaneous inflammatory fat stranding seen along the lateral aspect of the left hip. IMPRESSION: Acute comminuted fracture deformity of the greater trochanter of the proximal left femur. Electronically Signed   By: Virgina Norfolk M.D.   On: 04/08/2020 17:14   DG FEMUR MIN 2 VIEWS LEFT  Result Date: 04/08/2020 CLINICAL DATA:  Fall EXAM: PELVIS - 1-2 VIEW; LEFT FEMUR 2 VIEWS COMPARISON:  None. FINDINGS: There is a comminuted of LEFT greater trochanteric fracture. There is minimal displacement of the tip of the greater trochanter. No definite extension through the femoral neck but evaluation is limited secondary to osteopenia. Vascular calcifications. No pelvic diastasis. Mild degenerative changes of the lower lumbar spine. Limited assessment of the contralateral hip is unremarkable IMPRESSION: Comminuted of the LEFT greater trochanteric fracture with minimal displacement of the tip of the greater trochanter. No definite extension through the femoral neck but evaluation is limited secondary to osteopenia. Electronically Signed   By: Valentino Saxon MD   On: 04/08/2020 14:55     Subjective: Patient  was seen and examined at bedside.  Overnight events noted.  Patient reports feeling much better.  She has participated in physical therapy,  denies any dizziness.  She reports pain is better controlled.  Discharge Exam: Vitals:   04/12/20 0441 04/12/20 0757  BP: 131/75   Pulse: 71   Resp: 16 16  Temp: 97.9 F (36.6 C)   SpO2: 100% 99%   Vitals:   04/11/20 1953 04/11/20 2022 04/12/20 0441 04/12/20 0757  BP:  127/77 131/75   Pulse:  86 71   Resp:  18 16 16   Temp:  97.7 F (36.5 C) 97.9 F (36.6 C)   TempSrc:  Oral Oral   SpO2: 99% 100% 100% 99%  Weight:      Height:        General: Pt is alert, awake, not in acute distress Cardiovascular: RRR, S1/S2 +, no rubs, no gallops Respiratory: CTA bilaterally, no wheezing, no rhonchi Abdominal: Soft, NT, ND, bowel sounds + Extremities: no edema, no cyanosis, Left Thigh tenderness +    The results of significant diagnostics from this hospitalization (including imaging, microbiology, ancillary and laboratory) are listed below for reference.     Microbiology: Recent Results (from the past 240 hour(s))  SARS Coronavirus 2 by RT PCR (hospital order, performed in [facility] hospital lab) Nasopharyngeal Nasopharyngeal Swab  Status: None   Collection Time: 04/08/20  3:05 PM   Specimen: Nasopharyngeal Swab  Result Value Ref Range Status   SARS Coronavirus 2 NEGATIVE NEGATIVE Final    Comment: (NOTE) SARS-CoV-2 target nucleic acids are NOT DETECTED.  The SARS-CoV-2 RNA is generally detectable in upper and lower respiratory specimens during the acute phase of infection. The lowest concentration of SARS-CoV-2 viral copies this assay can detect is 250 copies / mL. A negative result does not preclude SARS-CoV-2 infection and should not be used as the sole basis for treatment or other patient management decisions.  A negative result may occur with improper specimen collection / handling, submission of specimen other than  nasopharyngeal swab, presence of viral mutation(s) within the areas targeted by this assay, and inadequate number of viral copies (<250 copies / mL). A negative result must be combined with clinical observations, patient history, and epidemiological information.  Fact Sheet for Patients:   StrictlyIdeas.no  Fact Sheet for Healthcare Providers: BankingDealers.co.za  This test is not yet approved or  cleared by the [place] FDA and has been authorized for detection and/or diagnosis of SARS-CoV-2 by FDA under an Emergency Use Authorization (EUA).  This EUA will remain in effect (meaning this test can be used) for the duration of the COVID-19 declaration under Section 564(b)(1) of the Act, 21 U.S.C. section 360bbb-3(b)(1), unless the authorization is terminated or revoked sooner.  Performed at [facility], [place] [address], [place], [place] [address]   SARS Coronavirus 2 by RT PCR (hospital order, performed in [facility] hospital lab) Nasopharyngeal Nasopharyngeal Swab     Status: None   Collection Time: 04/11/20  4:30 PM   Specimen: Nasopharyngeal Swab  Result Value Ref Range Status   SARS Coronavirus 2 NEGATIVE NEGATIVE Final    Comment: (NOTE) SARS-CoV-2 target nucleic acids are NOT DETECTED.  The SARS-CoV-2 RNA is generally detectable in upper and lower respiratory specimens during the acute phase of infection. The lowest concentration of SARS-CoV-2 viral copies this assay can detect is 250 copies / mL. A negative result does not preclude SARS-CoV-2 infection and should not be used as the sole basis for treatment or other patient management decisions.  A negative result may occur with improper specimen collection / handling, submission of specimen other than nasopharyngeal swab, presence of viral mutation(s) within the areas targeted by this assay, and inadequate number of viral copies (<250 copies / mL). A negative  result must be combined with clinical observations, patient history, and epidemiological information.  Fact Sheet for Patients:   StrictlyIdeas.no  Fact Sheet for Healthcare Providers: BankingDealers.co.za  This test is not yet approved or  cleared by the [place] FDA and has been authorized for detection and/or diagnosis of SARS-CoV-2 by FDA under an Emergency Use Authorization (EUA).  This EUA will remain in effect (meaning this test can be used) for the duration of the COVID-19 declaration under Section 564(b)(1) of the Act, 21 U.S.C. section 360bbb-3(b)(1), unless the authorization is terminated or revoked sooner.  Performed at [facility], [place] [address] [place], [place] [address]      Labs: BNP (last 3 results) Recent Labs    03/13/20 0222  BNP 983.3*   Basic Metabolic Panel: Recent Labs  Lab 04/08/20 1325  NA 137  K 3.8  CL 100  CO2 26  GLUCOSE 98  BUN 13  CREATININE 0.94  CALCIUM 9.2   Liver Function Tests: No results for input(s): AST, ALT, ALKPHOS,  BILITOT, PROT, ALBUMIN in the last 168 hours. No results for input(s): LIPASE, AMYLASE in the last 168 hours. No results for input(s): AMMONIA in the last 168 hours. CBC: Recent Labs  Lab 04/08/20 1325 04/08/20 1325 04/10/20 0203 04/10/20 1043 04/11/20 0824 04/11/20 1150 04/12/20 1057  WBC 11.9*  --  12.9*  --  9.2  --  7.8  NEUTROABS 10.0*  --   --   --   --   --   --   HGB 9.4*   < > 8.7* 8.7* 8.5* 8.3* 8.0*  HCT 29.6*   < > 26.6* 27.7* 27.7* 26.4* 26.0*  MCV 88.6  --  90.5  --  89.9  --  90.9  PLT 211  --  186  --  205  --  198   < > = values in this interval not displayed.   Cardiac Enzymes: No results for input(s): CKTOTAL, CKMB, CKMBINDEX, TROPONINI in the last 168 hours. BNP: Invalid input(s): POCBNP CBG: No results for input(s): GLUCAP in the last 168 hours. D-Dimer No results for input(s): DDIMER in the last 72 hours. Hgb  A1c No results for input(s): HGBA1C in the last 72 hours. Lipid Profile No results for input(s): CHOL, HDL, LDLCALC, TRIG, CHOLHDL, LDLDIRECT in the last 72 hours. Thyroid function studies No results for input(s): TSH, T4TOTAL, T3FREE, THYROIDAB in the last 72 hours.  Invalid input(s): FREET3 Anemia work up No results for input(s): VITAMINB12, FOLATE, FERRITIN, TIBC, IRON, RETICCTPCT in the last 72 hours. Urinalysis    Component Value Date/Time   COLORURINE STRAW (A) 03/13/2020 1128   APPEARANCEUR CLEAR 03/13/2020 1128   LABSPEC 1.006 03/13/2020 1128   PHURINE 6.0 03/13/2020 1128   GLUCOSEU NEGATIVE 03/13/2020 1128   HGBUR NEGATIVE 03/13/2020 1128   BILIRUBINUR NEGATIVE 03/13/2020 [place] 03/13/2020 1128   PROTEINUR NEGATIVE 03/13/2020 1128   UROBILINOGEN 1.0 03/27/2012 1119   NITRITE NEGATIVE 03/13/2020 1128   LEUKOCYTESUR NEGATIVE 03/13/2020 1128   Sepsis Labs Invalid input(s): PROCALCITONIN,  WBC,  LACTICIDVEN Microbiology Recent Results (from the past 240 hour(s))  SARS Coronavirus 2 by RT PCR (hospital order, performed in [place] hospital lab) Nasopharyngeal Nasopharyngeal Swab     Status: None   Collection Time: 04/08/20  3:05 PM   Specimen: Nasopharyngeal Swab  Result Value Ref Range Status   SARS Coronavirus 2 NEGATIVE NEGATIVE Final    Comment: (NOTE) SARS-CoV-2 target nucleic acids are NOT DETECTED.  The SARS-CoV-2 RNA is generally detectable in upper and lower respiratory specimens during the acute phase of infection. The lowest concentration of SARS-CoV-2 viral copies this assay can detect is 250 copies / mL. A negative result does not preclude SARS-CoV-2 infection and should not be used as the sole basis for treatment or other patient management decisions.  A negative result may occur with improper specimen collection / handling, submission of specimen other than nasopharyngeal swab, presence of viral mutation(s) within the areas  targeted by this assay, and inadequate number of viral copies (<250 copies / mL). A negative result must be combined with clinical observations, patient history, and epidemiological information.  Fact Sheet for Patients:   StrictlyIdeas.no  Fact Sheet for Healthcare Providers: BankingDealers.co.za  This test is not yet approved or  cleared by the [place] FDA and has been authorized for detection and/or diagnosis of SARS-CoV-2 by FDA under an Emergency Use Authorization (EUA).  This EUA will remain in effect (meaning this test can be used) for the duration  of the COVID-19 declaration under Section 564(b)(1) of the Act, 21 U.S.C. section 360bbb-3(b)(1), unless the authorization is terminated or revoked sooner.  Performed at [facility], [place] [address], [place], [place] [address]   SARS Coronavirus 2 by RT PCR (hospital order, performed in [facility] hospital lab) Nasopharyngeal Nasopharyngeal Swab     Status: None   Collection Time: 04/11/20  4:30 PM   Specimen: Nasopharyngeal Swab  Result Value Ref Range Status   SARS Coronavirus 2 NEGATIVE NEGATIVE Final    Comment: (NOTE) SARS-CoV-2 target nucleic acids are NOT DETECTED.  The SARS-CoV-2 RNA is generally detectable in upper and lower respiratory specimens during the acute phase of infection. The lowest concentration of SARS-CoV-2 viral copies this assay can detect is 250 copies / mL. A negative result does not preclude SARS-CoV-2 infection and should not be used as the sole basis for treatment or other patient management decisions.  A negative result may occur with improper specimen collection / handling, submission of specimen other than nasopharyngeal swab, presence of viral mutation(s) within the areas targeted by this assay, and inadequate number of viral copies (<250 copies / mL). A negative result must be combined with clinical observations, patient history, and  epidemiological information.  Fact Sheet for Patients:   StrictlyIdeas.no  Fact Sheet for Healthcare Providers: BankingDealers.co.za  This test is not yet approved or  cleared by the [place] FDA and has been authorized for detection and/or diagnosis of SARS-CoV-2 by FDA under an Emergency Use Authorization (EUA).  This EUA will remain in effect (meaning this test can be used) for the duration of the COVID-19 declaration under Section 564(b)(1) of the Act, 21 U.S.C. section 360bbb-3(b)(1), unless the authorization is terminated or revoked sooner.  Performed at [facility], [place] [address] [place], [place] [address]      Time coordinating discharge: Over 30 minutes  SIGNED:   Shawna Clamp, MD  [facility] 04/12/2020, 12:07 PM Pager   If 7PM-7AM, please contact night-coverage [email or web address]

## 2020-04-12 NOTE — Progress Notes (Signed)
Left message to [facility] to call this writer for pt report; PTAR here to transport pt.

## 2020-04-12 NOTE — Progress Notes (Signed)
Physical Therapy Treatment Patient Details Name: Kendra Watts MRN: [id number] DOB: [birth year] Today's Date: 04/12/2020    History of Present Illness 74 y.o. female with medical history significant of breast CA, COPD on chronic home 4L O2, CHF, HTN, recent admit for acute blood loss anemia due to nose bleed and discharged to SNF with return home 9/16 presented to ED 04/08/20 s/p fall with Lt greater trochanteric femur fx, L 3rd rib fx.     PT Comments    Pt supine on arrival, agreeable to therapy session with excellent participation and good tolerance for session. Primary session focus on progressing transfers, standing tolerance and gait progression. Pt performed gait trial in room up to 79ft at a time using RW and requiring mostly minA (at times min guard assist). Pt required decreased assist for bed mobility and transfers compared with previous session, however continues to require greatly increased time to initiate all mobility tasks due to pain/guarding in LLE. Pt continues to benefit from skilled PT services to progress toward functional mobility goals. Continue to recommend SNF level of rehab.   Follow Up Recommendations  SNF     Equipment Recommendations  Other (comment) (defer to next level of care)    Recommendations for Other Services       Precautions / Restrictions Precautions Precautions: Fall Restrictions Weight Bearing Restrictions: Yes LLE Weight Bearing: Weight bearing as tolerated    Mobility  Bed Mobility Overal bed mobility: Needs Assistance Bed Mobility: Supine to Sit     Supine to sit: Min assist     General bed mobility comments: increased time and effort for pt's max participation. pt able to assist with advancing bil LE's toward and off edge of bed with HOB 30 degrees. Pt able to bring trunk to long sit position on her own then pt was able to use UE's to fully scoot to edge of bed, minA with transfer pad and cues needed for improved scooting technique.  supervision for sitting at edge of bed with intermittent UE support.  Transfers Overall transfer level: Needs assistance Equipment used: Rolling walker (2 wheeled) Transfers: Sit to/from Stand Sit to Stand: Min assist;Min guard  General transfer comment: min guard assist to stand bed>RW, min assist to stand toilet>RW and min assist for sitting to toilet height and recliner with cues for hand placment and LLE advanced with transfers for pain control. cues on sequencing, posture and bil LE advancement.  Ambulation/Gait Ambulation/Gait assistance: Min assist Gait Distance (Feet): 40 Feet (54ft in room/to toilet, then ~5ft from toilet to sink/chair) Assistive device: Rolling walker (2 wheeled) Gait Pattern/deviations: Decreased step length - right;Decreased step length - left;Decreased stance time - left;Antalgic;Step-to pattern;Step-through pattern Gait velocity: grossly decreased Gait velocity interpretation: <1.31 ft/sec, indicative of household ambulator General Gait Details: initially step-to gait pattern progressing to step-through gait pattern. needs assist initially to advance RW but with increased time able to manage RW. extremely slow gait speed, however no increased WOB or LOB t/o.     Balance Overall balance assessment: Needs assistance;History of Falls Sitting-balance support: Bilateral upper extremity supported;Feet unsupported Sitting balance-Leahy Scale: Fair Sitting balance - Comments: shifts onto rt hemi-pelvis in sitting with UE support due to left hip pain, mild lean Postural control: Right lateral lean Standing balance support: During functional activity;Bilateral upper extremity supported Standing balance-Leahy Scale: Poor Standing balance comment: heavy BUE reliance on RW in stance and with weight shifting           Cognition Arousal/Alertness: Awake/alert Behavior  During Therapy: WFL for tasks assessed/performed Overall Cognitive Status: Within Functional  Limits for tasks assessed        General Comments: Pt able to answer all orientation questions appropriately, minor deficits in problem solving but functional      Exercises General Exercises - Lower Extremity Ankle Circles/Pumps: 15 reps;AROM;Both Gluteal Sets: AROM;10 reps    General Comments General comments (skin integrity, edema, etc.): intermittent cues for PLB/activity pacing however no s/sx distress and no SOB observed. very motivated.      Pertinent Vitals/Pain Pain Assessment: 0-10 Pain Score: 4  Pain Location: left hip Pain Descriptors / Indicators: Discomfort;Grimacing;Guarding;Sore Pain Intervention(s): Limited activity within patient's tolerance;Monitored during session;Premedicated before session;Repositioned (offered ice but pt defer)    Home Living                      Prior Function            PT Goals (current goals can now be found in the care plan section) Acute Rehab PT Goals Patient Stated Goal: get strong enough to return home alone PT Goal Formulation: With patient Time For Goal Achievement: 04/23/20 Potential to Achieve Goals: Good Progress towards PT goals: Progressing toward goals    Frequency    Min 3X/week      PT Plan Current plan remains appropriate    Co-evaluation              AM-PAC PT "6 Clicks" Mobility   Outcome Measure  Help needed turning from your back to your side while in a flat bed without using bedrails?: A Lot Help needed moving from lying on your back to sitting on the side of a flat bed without using bedrails?: A Little Help needed moving to and from a bed to a chair (including a wheelchair)?: A Lot Help needed standing up from a chair using your arms (e.g., wheelchair or bedside chair)?: A Little Help needed to walk in hospital room?: A Little Help needed climbing 3-5 steps with a railing? : A Lot 6 Click Score: 15    End of Session Equipment Utilized During Treatment: Gait  belt;Oxygen Activity Tolerance: Patient tolerated treatment well Patient left: in chair;with call bell/phone within reach;with chair alarm set Nurse Communication: Mobility status PT Visit Diagnosis: Muscle weakness (generalized) (M62.81);History of falling (Z91.81);Difficulty in walking, not elsewhere classified (R26.2);Pain Pain - Right/Left: Left Pain - part of body: Hip     Time: 0941-1030 PT Time Calculation (min) (ACUTE ONLY): 49 min  Charges:  $Gait Training: 23-37 mins $Therapeutic Activity: 8-22 mins                     Jaimee Corum P., PTA Acute Rehabilitation Services Pager: Not currently functional, please message via Secure Chat in Epic Office: [place] 04/12/2020, 10:49 AM

## 2020-04-12 NOTE — Progress Notes (Signed)
Pt in stable condition; PT[facility] transported pt to [facility]

## 2020-04-12 NOTE — TOC Transition Note (Signed)
Transition of Care [facility]) - CM/SW Discharge Note   Patient Details  Name: Kendra Watts MRN: [id number] Date of Birth: [birth year]  Transition of Care [facility]) CM/SW Contact:  Gabrielle Dare Phone Number: 04/12/2020, 1:22 PM   Clinical Narrative:     Patient will Discharge To: [facility] Anticipated DC Date:04/12/20 Family Notified:yes, sister Bernadene Person, [phone] Transport FB:PZWC   Per MD patient ready for DC to [facility] . RN, patient, patient's family, and facility notified of DC. Assessment, Fl2/Pasrr, and Discharge Summary sent to facility. RN given number for report [phone], Room # 206). DC packet on chart. Ambulance transport requested for patient for 1:30pm  CSW signing off.  Reed Breech LCSWA [phone]    Final next level of care: Skilled Nursing Facility Barriers to Discharge: No Barriers Identified   Patient Goals and CMS Choice        Discharge Placement              Patient chooses bed at: [facility] Patient to be transferred to facility by: [place] Name of family member notified: Bernadene Person Patient and family notified of of transfer: 04/12/20  Discharge Plan and Services In-house Referral: Clinical Social Work Discharge Planning Services: CM Consult Post Acute Care Choice: [place]                               Social Determinants of Health (SDOH) Interventions     Readmission Risk Interventions Readmission Risk Prevention Plan 03/16/2020  Transportation Screening Complete  PCP or Specialist Appt within 3-5 Days Complete  HRI or [place] Complete  Social Work Consult for [place] Planning/Counseling Complete  Palliative Care Screening Not Applicable  Medication Review Press photographer) Complete  Some recent data might be hidden

## 2020-04-12 NOTE — Plan of Care (Signed)

## 2020-04-13 DIAGNOSIS — S2232XD Fracture of one rib, left side, subsequent encounter for fracture with routine healing: Secondary | ICD-10-CM | POA: Diagnosis not present

## 2020-04-13 DIAGNOSIS — I739 Peripheral vascular disease, unspecified: Secondary | ICD-10-CM | POA: Diagnosis not present

## 2020-04-13 DIAGNOSIS — J449 Chronic obstructive pulmonary disease, unspecified: Secondary | ICD-10-CM | POA: Diagnosis not present

## 2020-04-13 DIAGNOSIS — S7292XD Unspecified fracture of left femur, subsequent encounter for closed fracture with routine healing: Secondary | ICD-10-CM | POA: Diagnosis not present

## 2020-04-13 DIAGNOSIS — I10 Essential (primary) hypertension: Secondary | ICD-10-CM | POA: Diagnosis not present

## 2020-04-13 DIAGNOSIS — E782 Mixed hyperlipidemia: Secondary | ICD-10-CM | POA: Diagnosis not present

## 2020-04-13 DIAGNOSIS — E039 Hypothyroidism, unspecified: Secondary | ICD-10-CM | POA: Diagnosis not present

## 2020-04-13 DIAGNOSIS — C50212 Malignant neoplasm of upper-inner quadrant of left female breast: Secondary | ICD-10-CM | POA: Diagnosis not present

## 2020-04-13 DIAGNOSIS — C3490 Malignant neoplasm of unspecified part of unspecified bronchus or lung: Secondary | ICD-10-CM | POA: Diagnosis not present

## 2020-04-13 DIAGNOSIS — I509 Heart failure, unspecified: Secondary | ICD-10-CM | POA: Diagnosis not present

## 2020-04-13 DIAGNOSIS — J9610 Chronic respiratory failure, unspecified whether with hypoxia or hypercapnia: Secondary | ICD-10-CM | POA: Diagnosis not present

## 2020-04-13 DIAGNOSIS — D649 Anemia, unspecified: Secondary | ICD-10-CM | POA: Diagnosis not present

## 2020-04-15 DIAGNOSIS — J449 Chronic obstructive pulmonary disease, unspecified: Secondary | ICD-10-CM | POA: Diagnosis not present

## 2020-04-15 DIAGNOSIS — M810 Age-related osteoporosis without current pathological fracture: Secondary | ICD-10-CM | POA: Diagnosis not present

## 2020-04-15 DIAGNOSIS — I739 Peripheral vascular disease, unspecified: Secondary | ICD-10-CM | POA: Diagnosis not present

## 2020-04-15 DIAGNOSIS — I509 Heart failure, unspecified: Secondary | ICD-10-CM | POA: Diagnosis not present

## 2020-04-15 DIAGNOSIS — K219 Gastro-esophageal reflux disease without esophagitis: Secondary | ICD-10-CM | POA: Diagnosis not present

## 2020-04-15 DIAGNOSIS — I10 Essential (primary) hypertension: Secondary | ICD-10-CM | POA: Diagnosis not present

## 2020-04-15 DIAGNOSIS — J9610 Chronic respiratory failure, unspecified whether with hypoxia or hypercapnia: Secondary | ICD-10-CM | POA: Diagnosis not present

## 2020-04-17 DIAGNOSIS — Z20828 Contact with and (suspected) exposure to other viral communicable diseases: Secondary | ICD-10-CM | POA: Diagnosis not present

## 2020-04-18 ENCOUNTER — Telehealth: Payer: Self-pay | Admitting: Hematology and Oncology

## 2020-04-18 ENCOUNTER — Other Ambulatory Visit: Payer: Self-pay | Admitting: Hematology and Oncology

## 2020-04-18 NOTE — Telephone Encounter (Signed)
Scheduled apt per 9/28 - no answer and unable to leave message. Mailed reminder letter with apt date and time

## 2020-04-19 DIAGNOSIS — Z20828 Contact with and (suspected) exposure to other viral communicable diseases: Secondary | ICD-10-CM | POA: Diagnosis not present

## 2020-04-19 DIAGNOSIS — E871 Hypo-osmolality and hyponatremia: Secondary | ICD-10-CM | POA: Diagnosis not present

## 2020-04-19 DIAGNOSIS — C3490 Malignant neoplasm of unspecified part of unspecified bronchus or lung: Secondary | ICD-10-CM | POA: Diagnosis not present

## 2020-04-19 DIAGNOSIS — E039 Hypothyroidism, unspecified: Secondary | ICD-10-CM | POA: Diagnosis not present

## 2020-04-19 DIAGNOSIS — S7292XA Unspecified fracture of left femur, initial encounter for closed fracture: Secondary | ICD-10-CM | POA: Diagnosis not present

## 2020-04-19 DIAGNOSIS — J9610 Chronic respiratory failure, unspecified whether with hypoxia or hypercapnia: Secondary | ICD-10-CM | POA: Diagnosis not present

## 2020-04-23 DIAGNOSIS — Z20828 Contact with and (suspected) exposure to other viral communicable diseases: Secondary | ICD-10-CM | POA: Diagnosis not present

## 2020-04-30 DIAGNOSIS — Z20828 Contact with and (suspected) exposure to other viral communicable diseases: Secondary | ICD-10-CM | POA: Diagnosis not present

## 2020-05-02 DIAGNOSIS — R682 Dry mouth, unspecified: Secondary | ICD-10-CM | POA: Diagnosis not present

## 2020-05-02 DIAGNOSIS — J449 Chronic obstructive pulmonary disease, unspecified: Secondary | ICD-10-CM | POA: Diagnosis not present

## 2020-05-02 DIAGNOSIS — I10 Essential (primary) hypertension: Secondary | ICD-10-CM | POA: Diagnosis not present

## 2020-05-02 DIAGNOSIS — D649 Anemia, unspecified: Secondary | ICD-10-CM | POA: Diagnosis not present

## 2020-05-02 DIAGNOSIS — I509 Heart failure, unspecified: Secondary | ICD-10-CM | POA: Diagnosis not present

## 2020-05-02 DIAGNOSIS — I739 Peripheral vascular disease, unspecified: Secondary | ICD-10-CM | POA: Diagnosis not present

## 2020-05-02 DIAGNOSIS — M810 Age-related osteoporosis without current pathological fracture: Secondary | ICD-10-CM | POA: Diagnosis not present

## 2020-05-02 DIAGNOSIS — J9610 Chronic respiratory failure, unspecified whether with hypoxia or hypercapnia: Secondary | ICD-10-CM | POA: Diagnosis not present

## 2020-05-02 DIAGNOSIS — E782 Mixed hyperlipidemia: Secondary | ICD-10-CM | POA: Diagnosis not present

## 2020-05-04 DIAGNOSIS — Z20828 Contact with and (suspected) exposure to other viral communicable diseases: Secondary | ICD-10-CM | POA: Diagnosis not present

## 2020-05-08 DIAGNOSIS — Z20828 Contact with and (suspected) exposure to other viral communicable diseases: Secondary | ICD-10-CM | POA: Diagnosis not present

## 2020-05-10 DIAGNOSIS — N390 Urinary tract infection, site not specified: Secondary | ICD-10-CM | POA: Diagnosis not present

## 2020-05-10 DIAGNOSIS — I739 Peripheral vascular disease, unspecified: Secondary | ICD-10-CM | POA: Diagnosis not present

## 2020-05-10 DIAGNOSIS — S2232XD Fracture of one rib, left side, subsequent encounter for fracture with routine healing: Secondary | ICD-10-CM | POA: Diagnosis not present

## 2020-05-10 DIAGNOSIS — S7292XD Unspecified fracture of left femur, subsequent encounter for closed fracture with routine healing: Secondary | ICD-10-CM | POA: Diagnosis not present

## 2020-05-10 DIAGNOSIS — E782 Mixed hyperlipidemia: Secondary | ICD-10-CM | POA: Diagnosis not present

## 2020-05-10 DIAGNOSIS — E039 Hypothyroidism, unspecified: Secondary | ICD-10-CM | POA: Diagnosis not present

## 2020-05-10 DIAGNOSIS — I509 Heart failure, unspecified: Secondary | ICD-10-CM | POA: Diagnosis not present

## 2020-05-10 DIAGNOSIS — D649 Anemia, unspecified: Secondary | ICD-10-CM | POA: Diagnosis not present

## 2020-05-10 DIAGNOSIS — J449 Chronic obstructive pulmonary disease, unspecified: Secondary | ICD-10-CM | POA: Diagnosis not present

## 2020-05-10 DIAGNOSIS — I10 Essential (primary) hypertension: Secondary | ICD-10-CM | POA: Diagnosis not present

## 2020-05-11 DIAGNOSIS — Z20828 Contact with and (suspected) exposure to other viral communicable diseases: Secondary | ICD-10-CM | POA: Diagnosis not present

## 2020-05-15 ENCOUNTER — Inpatient Hospital Stay: Payer: Medicare Other | Attending: Hematology and Oncology | Admitting: Hematology and Oncology

## 2020-05-15 DIAGNOSIS — Z20828 Contact with and (suspected) exposure to other viral communicable diseases: Secondary | ICD-10-CM | POA: Diagnosis not present

## 2020-05-15 NOTE — Assessment & Plan Note (Deleted)
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

## 2020-05-15 NOTE — Assessment & Plan Note (Deleted)
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 Mammograms 10/28/2019: Benign breast density category C 2. breast exam 05/15/2020: Benign

## 2020-05-17 ENCOUNTER — Ambulatory Visit (HOSPITAL_COMMUNITY): Payer: Medicare Other

## 2020-05-17 ENCOUNTER — Ambulatory Visit (HOSPITAL_COMMUNITY): Admit: 2020-05-17 | Payer: Medicare Other | Attending: Cardiovascular Disease | Admitting: Cardiovascular Disease

## 2020-05-18 ENCOUNTER — Other Ambulatory Visit: Payer: Self-pay | Admitting: *Deleted

## 2020-05-18 DIAGNOSIS — Z20828 Contact with and (suspected) exposure to other viral communicable diseases: Secondary | ICD-10-CM | POA: Diagnosis not present

## 2020-05-18 NOTE — Patient Outreach (Signed)
[facility] Post- Acute Care Coordinator follow up. Member screened for potential [facility] Care Management needs as a benefit of [employer].  Verified in Patient Kendra Watts that member resides in [facility] SNF.   Communication sent to SNF SW to inquire about transition plans and potential [facility] Care Management needs.   Will continue to follow while member resides in SNF.    [name], MSN-Ed, RN,BSN [place] Acute Care Coordinator [phone] [facility])

## 2020-05-20 ENCOUNTER — Inpatient Hospital Stay (HOSPITAL_COMMUNITY)
Admission: EM | Admit: 2020-05-20 | Discharge: 2020-05-23 | DRG: 291 | Disposition: A | Discharge status: Skilled Nursing Facility | Payer: Medicare Other | Source: Skilled Nursing Facility | Attending: Internal Medicine | Admitting: Internal Medicine

## 2020-05-20 ENCOUNTER — Encounter (HOSPITAL_COMMUNITY): Payer: Self-pay

## 2020-05-20 ENCOUNTER — Emergency Department (HOSPITAL_COMMUNITY): Payer: Medicare Other

## 2020-05-20 DIAGNOSIS — E871 Hypo-osmolality and hyponatremia: Secondary | ICD-10-CM | POA: Diagnosis not present

## 2020-05-20 DIAGNOSIS — R040 Epistaxis: Secondary | ICD-10-CM | POA: Diagnosis present

## 2020-05-20 DIAGNOSIS — I701 Atherosclerosis of renal artery: Secondary | ICD-10-CM | POA: Diagnosis present

## 2020-05-20 DIAGNOSIS — Q278 Other specified congenital malformations of peripheral vascular system: Secondary | ICD-10-CM

## 2020-05-20 DIAGNOSIS — J90 Pleural effusion, not elsewhere classified: Secondary | ICD-10-CM | POA: Diagnosis not present

## 2020-05-20 DIAGNOSIS — F419 Anxiety disorder, unspecified: Secondary | ICD-10-CM | POA: Diagnosis present

## 2020-05-20 DIAGNOSIS — F17210 Nicotine dependence, cigarettes, uncomplicated: Secondary | ICD-10-CM | POA: Diagnosis present

## 2020-05-20 DIAGNOSIS — Z8249 Family history of ischemic heart disease and other diseases of the circulatory system: Secondary | ICD-10-CM

## 2020-05-20 DIAGNOSIS — Z170 Estrogen receptor positive status [ER+]: Secondary | ICD-10-CM | POA: Diagnosis not present

## 2020-05-20 DIAGNOSIS — R0902 Hypoxemia: Secondary | ICD-10-CM | POA: Diagnosis not present

## 2020-05-20 DIAGNOSIS — Z20822 Contact with and (suspected) exposure to covid-19: Secondary | ICD-10-CM | POA: Diagnosis present

## 2020-05-20 DIAGNOSIS — C3492 Malignant neoplasm of unspecified part of left bronchus or lung: Secondary | ICD-10-CM | POA: Diagnosis not present

## 2020-05-20 DIAGNOSIS — R918 Other nonspecific abnormal finding of lung field: Secondary | ICD-10-CM | POA: Diagnosis present

## 2020-05-20 DIAGNOSIS — M6281 Muscle weakness (generalized): Secondary | ICD-10-CM | POA: Diagnosis not present

## 2020-05-20 DIAGNOSIS — H269 Unspecified cataract: Secondary | ICD-10-CM | POA: Diagnosis present

## 2020-05-20 DIAGNOSIS — Z801 Family history of malignant neoplasm of trachea, bronchus and lung: Secondary | ICD-10-CM

## 2020-05-20 DIAGNOSIS — I509 Heart failure, unspecified: Secondary | ICD-10-CM | POA: Diagnosis not present

## 2020-05-20 DIAGNOSIS — J811 Chronic pulmonary edema: Secondary | ICD-10-CM | POA: Diagnosis not present

## 2020-05-20 DIAGNOSIS — M2550 Pain in unspecified joint: Secondary | ICD-10-CM | POA: Diagnosis not present

## 2020-05-20 DIAGNOSIS — R4020 Unspecified coma: Secondary | ICD-10-CM | POA: Diagnosis not present

## 2020-05-20 DIAGNOSIS — J189 Pneumonia, unspecified organism: Secondary | ICD-10-CM | POA: Diagnosis present

## 2020-05-20 DIAGNOSIS — J9621 Acute and chronic respiratory failure with hypoxia: Secondary | ICD-10-CM | POA: Diagnosis present

## 2020-05-20 DIAGNOSIS — R2681 Unsteadiness on feet: Secondary | ICD-10-CM | POA: Diagnosis not present

## 2020-05-20 DIAGNOSIS — Z7401 Bed confinement status: Secondary | ICD-10-CM | POA: Diagnosis not present

## 2020-05-20 DIAGNOSIS — J9811 Atelectasis: Secondary | ICD-10-CM | POA: Diagnosis present

## 2020-05-20 DIAGNOSIS — Z9981 Dependence on supplemental oxygen: Secondary | ICD-10-CM

## 2020-05-20 DIAGNOSIS — J432 Centrilobular emphysema: Secondary | ICD-10-CM

## 2020-05-20 DIAGNOSIS — J441 Chronic obstructive pulmonary disease with (acute) exacerbation: Secondary | ICD-10-CM

## 2020-05-20 DIAGNOSIS — I5032 Chronic diastolic (congestive) heart failure: Secondary | ICD-10-CM

## 2020-05-20 DIAGNOSIS — I5033 Acute on chronic diastolic (congestive) heart failure: Secondary | ICD-10-CM | POA: Diagnosis present

## 2020-05-20 DIAGNOSIS — E785 Hyperlipidemia, unspecified: Secondary | ICD-10-CM | POA: Diagnosis present

## 2020-05-20 DIAGNOSIS — J9622 Acute and chronic respiratory failure with hypercapnia: Secondary | ICD-10-CM | POA: Diagnosis not present

## 2020-05-20 DIAGNOSIS — J449 Chronic obstructive pulmonary disease, unspecified: Secondary | ICD-10-CM | POA: Diagnosis not present

## 2020-05-20 DIAGNOSIS — J9602 Acute respiratory failure with hypercapnia: Secondary | ICD-10-CM | POA: Diagnosis present

## 2020-05-20 DIAGNOSIS — S72115S Nondisplaced fracture of greater trochanter of left femur, sequela: Secondary | ICD-10-CM | POA: Diagnosis not present

## 2020-05-20 DIAGNOSIS — W19XXXA Unspecified fall, initial encounter: Secondary | ICD-10-CM | POA: Diagnosis present

## 2020-05-20 DIAGNOSIS — Y92129 Unspecified place in nursing home as the place of occurrence of the external cause: Secondary | ICD-10-CM

## 2020-05-20 DIAGNOSIS — I110 Hypertensive heart disease with heart failure: Principal | ICD-10-CM | POA: Diagnosis present

## 2020-05-20 DIAGNOSIS — I959 Hypotension, unspecified: Secondary | ICD-10-CM | POA: Diagnosis present

## 2020-05-20 DIAGNOSIS — J810 Acute pulmonary edema: Secondary | ICD-10-CM | POA: Diagnosis not present

## 2020-05-20 DIAGNOSIS — J9600 Acute respiratory failure, unspecified whether with hypoxia or hypercapnia: Secondary | ICD-10-CM | POA: Diagnosis not present

## 2020-05-20 DIAGNOSIS — I517 Cardiomegaly: Secondary | ICD-10-CM | POA: Diagnosis not present

## 2020-05-20 DIAGNOSIS — Z85118 Personal history of other malignant neoplasm of bronchus and lung: Secondary | ICD-10-CM

## 2020-05-20 DIAGNOSIS — E039 Hypothyroidism, unspecified: Secondary | ICD-10-CM | POA: Diagnosis present

## 2020-05-20 DIAGNOSIS — R55 Syncope and collapse: Secondary | ICD-10-CM | POA: Diagnosis not present

## 2020-05-20 DIAGNOSIS — C3491 Malignant neoplasm of unspecified part of right bronchus or lung: Secondary | ICD-10-CM | POA: Diagnosis not present

## 2020-05-20 DIAGNOSIS — D62 Acute posthemorrhagic anemia: Secondary | ICD-10-CM | POA: Diagnosis present

## 2020-05-20 DIAGNOSIS — R234 Changes in skin texture: Secondary | ICD-10-CM | POA: Diagnosis present

## 2020-05-20 DIAGNOSIS — R5381 Other malaise: Secondary | ICD-10-CM | POA: Diagnosis not present

## 2020-05-20 DIAGNOSIS — J9601 Acute respiratory failure with hypoxia: Secondary | ICD-10-CM

## 2020-05-20 DIAGNOSIS — I5021 Acute systolic (congestive) heart failure: Secondary | ICD-10-CM | POA: Diagnosis not present

## 2020-05-20 DIAGNOSIS — Z79899 Other long term (current) drug therapy: Secondary | ICD-10-CM

## 2020-05-20 DIAGNOSIS — J181 Lobar pneumonia, unspecified organism: Secondary | ICD-10-CM | POA: Diagnosis not present

## 2020-05-20 DIAGNOSIS — I10 Essential (primary) hypertension: Secondary | ICD-10-CM | POA: Diagnosis not present

## 2020-05-20 DIAGNOSIS — R0602 Shortness of breath: Secondary | ICD-10-CM | POA: Diagnosis not present

## 2020-05-20 DIAGNOSIS — C50412 Malignant neoplasm of upper-outer quadrant of left female breast: Secondary | ICD-10-CM | POA: Diagnosis not present

## 2020-05-20 DIAGNOSIS — E611 Iron deficiency: Secondary | ICD-10-CM | POA: Diagnosis present

## 2020-05-20 DIAGNOSIS — R590 Localized enlarged lymph nodes: Secondary | ICD-10-CM | POA: Diagnosis present

## 2020-05-20 DIAGNOSIS — Z803 Family history of malignant neoplasm of breast: Secondary | ICD-10-CM

## 2020-05-20 DIAGNOSIS — S72112D Displaced fracture of greater trochanter of left femur, subsequent encounter for closed fracture with routine healing: Secondary | ICD-10-CM

## 2020-05-20 DIAGNOSIS — J9611 Chronic respiratory failure with hypoxia: Secondary | ICD-10-CM | POA: Diagnosis present

## 2020-05-20 DIAGNOSIS — N289 Disorder of kidney and ureter, unspecified: Secondary | ICD-10-CM | POA: Diagnosis present

## 2020-05-20 DIAGNOSIS — S2242XA Multiple fractures of ribs, left side, initial encounter for closed fracture: Secondary | ICD-10-CM | POA: Diagnosis present

## 2020-05-20 DIAGNOSIS — R2689 Other abnormalities of gait and mobility: Secondary | ICD-10-CM | POA: Diagnosis not present

## 2020-05-20 DIAGNOSIS — Z209 Contact with and (suspected) exposure to unspecified communicable disease: Secondary | ICD-10-CM | POA: Diagnosis not present

## 2020-05-20 DIAGNOSIS — E213 Hyperparathyroidism, unspecified: Secondary | ICD-10-CM | POA: Diagnosis present

## 2020-05-20 DIAGNOSIS — I6529 Occlusion and stenosis of unspecified carotid artery: Secondary | ICD-10-CM | POA: Diagnosis not present

## 2020-05-20 DIAGNOSIS — J9612 Chronic respiratory failure with hypercapnia: Secondary | ICD-10-CM | POA: Diagnosis not present

## 2020-05-20 DIAGNOSIS — Z853 Personal history of malignant neoplasm of breast: Secondary | ICD-10-CM

## 2020-05-20 DIAGNOSIS — Z923 Personal history of irradiation: Secondary | ICD-10-CM | POA: Diagnosis not present

## 2020-05-20 DIAGNOSIS — Z7982 Long term (current) use of aspirin: Secondary | ICD-10-CM

## 2020-05-20 DIAGNOSIS — Z79890 Hormone replacement therapy (postmenopausal): Secondary | ICD-10-CM

## 2020-05-20 DIAGNOSIS — J440 Chronic obstructive pulmonary disease with acute lower respiratory infection: Secondary | ICD-10-CM | POA: Diagnosis present

## 2020-05-20 DIAGNOSIS — M6259 Muscle wasting and atrophy, not elsewhere classified, multiple sites: Secondary | ICD-10-CM | POA: Diagnosis not present

## 2020-05-20 DIAGNOSIS — R4182 Altered mental status, unspecified: Secondary | ICD-10-CM | POA: Diagnosis not present

## 2020-05-20 DIAGNOSIS — E1165 Type 2 diabetes mellitus with hyperglycemia: Secondary | ICD-10-CM | POA: Diagnosis not present

## 2020-05-20 LAB — POC OCCULT BLOOD, ED: Fecal Occult Bld: NEGATIVE

## 2020-05-20 LAB — COMPREHENSIVE METABOLIC PANEL
ALT: 57 U/L — ABNORMAL HIGH (ref 0–44)
AST: 43 U/L — ABNORMAL HIGH (ref 15–41)
Albumin: 2.5 g/dL — ABNORMAL LOW (ref 3.5–5.0)
Alkaline Phosphatase: 66 U/L (ref 38–126)
Anion gap: 9 (ref 5–15)
BUN: 11 mg/dL (ref 8–23)
CO2: 35 mmol/L — ABNORMAL HIGH (ref 22–32)
Calcium: 8.4 mg/dL — ABNORMAL LOW (ref 8.9–10.3)
Chloride: 96 mmol/L — ABNORMAL LOW (ref 98–111)
Creatinine, Ser: 0.9 mg/dL (ref 0.44–1.00)
GFR, Estimated: >60 mL/min (ref >60)
Glucose, Bld: 140 mg/dL — ABNORMAL HIGH (ref 70–99)
Potassium: 3.7 mmol/L (ref 3.5–5.1)
Sodium: 140 mmol/L (ref 135–145)
Total Bilirubin: 0.3 mg/dL (ref 0.3–1.2)
Total Protein: 5.5 g/dL — ABNORMAL LOW (ref 6.5–8.1)

## 2020-05-20 LAB — CBC
HCT: 22.4 % — ABNORMAL LOW (ref 36.0–46.0)
Hemoglobin: 6.7 g/dL — CL (ref 12.0–15.0)
MCH: 26.9 pg (ref 26.0–34.0)
MCHC: 29.9 g/dL — ABNORMAL LOW (ref 30.0–36.0)
MCV: 90 fL (ref 80.0–100.0)
Platelets: 429 10*3/uL — ABNORMAL HIGH (ref 150–400)
RBC: 2.49 MIL/uL — ABNORMAL LOW (ref 3.87–5.11)
RDW: 17.8 % — ABNORMAL HIGH (ref 11.5–15.5)
WBC: 11.7 10*3/uL — ABNORMAL HIGH (ref 4.0–10.5)
nRBC: 0 % (ref 0.0–0.2)

## 2020-05-20 LAB — CBC WITH DIFFERENTIAL/PLATELET
Abs Immature Granulocytes: 0.08 10*3/uL — ABNORMAL HIGH (ref 0.00–0.07)
Basophils Absolute: 0 10*3/uL (ref 0.0–0.1)
Basophils Relative: 0 %
Eosinophils Absolute: 0.2 10*3/uL (ref 0.0–0.5)
Eosinophils Relative: 1 %
HCT: 21.8 % — ABNORMAL LOW (ref 36.0–46.0)
Hemoglobin: 6.4 g/dL — CL (ref 12.0–15.0)
Immature Granulocytes: 1 %
Lymphocytes Relative: 5 %
Lymphs Abs: 0.5 10*3/uL — ABNORMAL LOW (ref 0.7–4.0)
MCH: 26.8 pg (ref 26.0–34.0)
MCHC: 29.4 g/dL — ABNORMAL LOW (ref 30.0–36.0)
MCV: 91.2 fL (ref 80.0–100.0)
Monocytes Absolute: 1 10*3/uL (ref 0.1–1.0)
Monocytes Relative: 9 %
Neutro Abs: 10.1 10*3/uL — ABNORMAL HIGH (ref 1.7–7.7)
Neutrophils Relative %: 84 %
Platelets: 382 10*3/uL (ref 150–400)
RBC: 2.39 MIL/uL — ABNORMAL LOW (ref 3.87–5.11)
RDW: 17.5 % — ABNORMAL HIGH (ref 11.5–15.5)
WBC: 11.9 10*3/uL — ABNORMAL HIGH (ref 4.0–10.5)
nRBC: 0 % (ref 0.0–0.2)

## 2020-05-20 LAB — I-STAT ARTERIAL BLOOD GAS, ED
Acid-Base Excess: 13 mmol/L — ABNORMAL HIGH (ref 0.0–2.0)
Bicarbonate: 40.3 mmol/L — ABNORMAL HIGH (ref 20.0–28.0)
Calcium, Ion: 1.22 mmol/L (ref 1.15–1.40)
HCT: 20 % — ABNORMAL LOW (ref 36.0–46.0)
Hemoglobin: 6.8 g/dL — CL (ref 12.0–15.0)
O2 Saturation: 99 %
Potassium: 3.5 mmol/L (ref 3.5–5.1)
Sodium: 139 mmol/L (ref 135–145)
TCO2: 43 mmol/L — ABNORMAL HIGH (ref 22–32)
pCO2 arterial: 76.1 mmHg (ref 32.0–48.0)
pH, Arterial: 7.331 — ABNORMAL LOW (ref 7.350–7.450)
pO2, Arterial: 145 mmHg — ABNORMAL HIGH (ref 83.0–108.0)

## 2020-05-20 LAB — RESPIRATORY PANEL BY RT PCR (FLU A&B, COVID)
Influenza A by PCR: NEGATIVE
Influenza B by PCR: NEGATIVE
SARS Coronavirus 2 by RT PCR: NEGATIVE

## 2020-05-20 LAB — BRAIN NATRIURETIC PEPTIDE: B Natriuretic Peptide: 802.4 pg/mL — ABNORMAL HIGH (ref 0.0–100.0)

## 2020-05-20 LAB — IRON AND TIBC
Iron: 13 ug/dL — ABNORMAL LOW (ref 28–170)
Saturation Ratios: 6 % — ABNORMAL LOW (ref 10.4–31.8)
TIBC: 204 ug/dL — ABNORMAL LOW (ref 250–450)
UIBC: 191 ug/dL

## 2020-05-20 LAB — PREPARE RBC (CROSSMATCH)

## 2020-05-20 LAB — LACTIC ACID, PLASMA: Lactic Acid, Venous: 1.6 mmol/L (ref 0.5–1.9)

## 2020-05-20 LAB — RETICULOCYTES
Immature Retic Fract: 18.3 % — ABNORMAL HIGH (ref 2.3–15.9)
RBC.: 2.49 MIL/uL — ABNORMAL LOW (ref 3.87–5.11)
Retic Count, Absolute: 26.6 10*3/uL (ref 19.0–186.0)
Retic Ct Pct: 1.1 % (ref 0.4–3.1)

## 2020-05-20 LAB — VITAMIN B12: Vitamin B-12: 492 pg/mL (ref 180–914)

## 2020-05-20 LAB — TROPONIN I (HIGH SENSITIVITY)
Troponin I (High Sensitivity): 90 ng/L — ABNORMAL HIGH (ref <18)
Troponin I (High Sensitivity): 98 ng/L — ABNORMAL HIGH (ref <18)

## 2020-05-20 LAB — PROTIME-INR
INR: 1.1 (ref 0.8–1.2)
Prothrombin Time: 14 seconds (ref 11.4–15.2)

## 2020-05-20 LAB — FERRITIN: Ferritin: 110 ng/mL (ref 11–307)

## 2020-05-20 LAB — CREATININE, SERUM
Creatinine, Ser: 0.74 mg/dL (ref 0.44–1.00)
GFR, Estimated: >60 mL/min (ref >60)

## 2020-05-20 LAB — FOLATE: Folate: 42.6 ng/mL (ref >5.9)

## 2020-05-20 MED ORDER — ALBUTEROL SULFATE (2.5 MG/3ML) 0.083% IN NEBU: 2.5000 mg | INHALATION_SOLUTION | RESPIRATORY_TRACT | Status: DC | PRN

## 2020-05-20 MED ORDER — SODIUM CHLORIDE 0.9 % IV BOLUS
500.0000 mL | Freq: Once | INTRAVENOUS | Status: AC
  Administered 2020-05-20: 500 mL via INTRAVENOUS

## 2020-05-20 MED ORDER — ASPIRIN EC 81 MG PO TBEC
81.0000 mg | DELAYED_RELEASE_TABLET | Freq: Every day | ORAL | Status: DC
  Administered 2020-05-21 – 2020-05-23 (×3): 81 mg via ORAL
  Filled 2020-05-20 (×3): qty 1

## 2020-05-20 MED ORDER — SODIUM CHLORIDE 0.9 % IV BOLUS
1000.0000 mL | Freq: Once | INTRAVENOUS | Status: AC
  Administered 2020-05-20: 1000 mL via INTRAVENOUS

## 2020-05-20 MED ORDER — IOHEXOL 350 MG/ML SOLN
58.0000 mL | Freq: Once | INTRAVENOUS | Status: AC | PRN
  Administered 2020-05-20: 58 mL via INTRAVENOUS

## 2020-05-20 MED ORDER — ROSUVASTATIN CALCIUM 5 MG PO TABS
10.0000 mg | ORAL_TABLET | Freq: Every day | ORAL | Status: DC
  Administered 2020-05-21 – 2020-05-23 (×3): 10 mg via ORAL
  Filled 2020-05-20 (×3): qty 2

## 2020-05-20 MED ORDER — LEVOTHYROXINE SODIUM 50 MCG PO TABS
50.0000 ug | ORAL_TABLET | Freq: Every day | ORAL | Status: DC
  Administered 2020-05-21 – 2020-05-23 (×3): 50 ug via ORAL
  Filled 2020-05-20 (×3): qty 1

## 2020-05-20 MED ORDER — REVEFENACIN 175 MCG/3ML IN SOLN
175.0000 ug | Freq: Every day | RESPIRATORY_TRACT | Status: DC
  Administered 2020-05-21 – 2020-05-23 (×3): 175 ug via RESPIRATORY_TRACT
  Filled 2020-05-20 (×4): qty 3

## 2020-05-20 MED ORDER — SODIUM CHLORIDE 0.9% FLUSH
3.0000 mL | Freq: Once | INTRAVENOUS | Status: AC
Start: 2020-05-20 — End: 2020-05-20
  Administered 2020-05-20: 3 mL via INTRAVENOUS

## 2020-05-20 MED ORDER — SODIUM CHLORIDE 0.9 % IV SOLN: 10.0000 mL/h | Freq: Once | INTRAVENOUS | Status: DC

## 2020-05-20 MED ORDER — FUROSEMIDE 10 MG/ML IJ SOLN
40.0000 mg | Freq: Once | INTRAMUSCULAR | Status: AC
  Administered 2020-05-20: 40 mg via INTRAVENOUS
  Filled 2020-05-20: qty 4

## 2020-05-20 MED ORDER — ENOXAPARIN SODIUM 40 MG/0.4ML ~~LOC~~ SOLN
40.0000 mg | SUBCUTANEOUS | Status: DC
  Administered 2020-05-20 – 2020-05-21 (×2): 40 mg via SUBCUTANEOUS
  Filled 2020-05-20 (×2): qty 0.4

## 2020-05-20 MED ORDER — ONDANSETRON HCL 4 MG/2ML IJ SOLN: 4.0000 mg | Freq: Four times a day (QID) | INTRAMUSCULAR | Status: DC | PRN

## 2020-05-20 MED ORDER — DOXAZOSIN MESYLATE 8 MG PO TABS: 8.0000 mg | ORAL_TABLET | Freq: Every day | ORAL | Status: DC

## 2020-05-20 MED ORDER — ADULT MULTIVITAMIN W/MINERALS CH
1.0000 | ORAL_TABLET | Freq: Every day | ORAL | Status: DC
  Administered 2020-05-21 – 2020-05-23 (×3): 1 via ORAL
  Filled 2020-05-20 (×3): qty 1

## 2020-05-20 MED ORDER — SODIUM CHLORIDE 0.9% FLUSH
3.0000 mL | Freq: Two times a day (BID) | INTRAVENOUS | Status: DC
  Administered 2020-05-20 – 2020-05-23 (×6): 3 mL via INTRAVENOUS

## 2020-05-20 MED ORDER — TAMOXIFEN CITRATE 10 MG PO TABS
20.0000 mg | ORAL_TABLET | Freq: Every day | ORAL | Status: DC
  Administered 2020-05-20 – 2020-05-23 (×4): 20 mg via ORAL
  Filled 2020-05-20 (×5): qty 2

## 2020-05-20 MED ORDER — METOPROLOL TARTRATE 25 MG PO TABS: 25.0000 mg | ORAL_TABLET | Freq: Every day | ORAL | Status: DC

## 2020-05-20 MED ORDER — ALBUTEROL SULFATE (2.5 MG/3ML) 0.083% IN NEBU
5.0000 mg | INHALATION_SOLUTION | Freq: Once | RESPIRATORY_TRACT | Status: AC
  Administered 2020-05-20: 5 mg via RESPIRATORY_TRACT
  Filled 2020-05-20: qty 6

## 2020-05-20 MED ORDER — SODIUM CHLORIDE 0.9% FLUSH: 3.0000 mL | INTRAVENOUS | Status: DC | PRN

## 2020-05-20 MED ORDER — ACETAMINOPHEN 325 MG PO TABS: 650.0000 mg | ORAL_TABLET | ORAL | Status: DC | PRN

## 2020-05-20 MED ORDER — ARFORMOTEROL TARTRATE 15 MCG/2ML IN NEBU
15.0000 ug | INHALATION_SOLUTION | Freq: Two times a day (BID) | RESPIRATORY_TRACT | Status: DC
  Administered 2020-05-21 – 2020-05-23 (×5): 15 ug via RESPIRATORY_TRACT
  Filled 2020-05-20 (×7): qty 2

## 2020-05-20 MED ORDER — SODIUM CHLORIDE 0.9 % IV SOLN
250.0000 mL | INTRAVENOUS | Status: DC | PRN
  Administered 2020-05-22: 250 mL via INTRAVENOUS

## 2020-05-20 MED ORDER — SODIUM CHLORIDE 0.9% IV SOLUTION: Freq: Once | INTRAVENOUS | Status: DC

## 2020-05-20 MED ORDER — PANTOPRAZOLE SODIUM 40 MG PO TBEC
40.0000 mg | DELAYED_RELEASE_TABLET | Freq: Every day | ORAL | Status: DC
  Administered 2020-05-21 – 2020-05-23 (×3): 40 mg via ORAL
  Filled 2020-05-20 (×3): qty 1

## 2020-05-20 NOTE — Consult Note (Addendum)
NAME:  Kendra Watts, MRN:  [id number], DOB:  1945/08/02, LOS: 0 ADMISSION DATE:  05/20/2020, CONSULTATION DATE:  05/20/2020 REFERRING MD:  Dr. Langston Masker, ER, CHIEF COMPLAINT:  Short of breath   Brief History   74 yo female smoker fell at nursing home and had more shortness of breath for past 3 days.  In ER found to have hypoxia with SpO2 55% on 2 liters.  Also noted to have anemia and hypotension.  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  PCCM asked to assist with respiratory management.  History of present illness   She is followed by Dr. Lamonte Sakai in pulmonary office.  She has severe COPD on home oxygen.  She also has history of concurrent b/l primary lung cancer.  She was in NH and fell.  She hurt her side.  She has been feeling more short of breath since.  Has dry cough.  No headache, dizziness, fever, abdominal pain, diarrhea, melena, or leg swelling.  Found to have hypoxia and hypotension in ER.  Given fluid bolus and supplemental oxygen.  Then EDP concerned she had crackles on exam and lasix ordered.  She is being set up for PRBC transfusion.  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 rate (!) 23, SpO2 98 %.        Intake/Output Summary (Last 24 hours) at 05/20/2020 1739 Last data filed at 05/20/2020 1639 Gross per 24 hour  Intake 1264.42 ml  Output --  Net 1264.42 ml   There were no vitals filed for this visit.  Examination:  General - alert Eyes - pupils reactive ENT - no sinus tenderness, no stridor Cardiac - regular rate/rhythm, no murmur Chest - decreased BS, no wheeze, no tenderness Abdomen - soft, non tender, + bowel sounds Extremities - no cyanosis, clubbing, or edema Skin - no rashes Neuro - normal strength, moves extremities, follows commands Psych - normal mood and behavior  Resolved Hospital Problem list     Assessment & Plan:   Acute on chronic hypoxic, hypercapnic respiratory failure from interstitial edema and 3rd/4th Lt rib fractures in setting of severe COPD with emphysema. - goal SpO2 90 to 95% - bronchial hygiene - yupelri, brovana, prn albuterol - f/u CXR intermittently  Acute on chronic diastolic CHF. - f/u Echo  Anemia. - no obvious source of bleeding - PRBC transfusion ordered in ER  Hypotension. - monitor hemodynamics after blood transfusion  Mediastinal adenopathy with hx of breast cancer and lung cancer. - will need f/u imaging as an outpt - previously followed by [name] with oncology and Dr. Sondra Come with  radiation oncology  Should be okay for admission to progressive care with hospitalist team as primary service.  PCCM will follow up as pulmonary consult.  Labs    CMP Latest Ref Rng & Units 05/20/2020 05/20/2020 04/12/2020  Glucose 70 - 99 mg/dL 140(H) - 133(H)  BUN 8 - 23 mg/dL 11 - 9  Creatinine 0.44 - 1.00 mg/dL 0.90 - 0.86  Sodium 135 - 145 mmol/L 140 139 135  Potassium 3.5 - 5.1 mmol/L 3.7 3.5 3.7  Chloride 98 - 111 mmol/L 96(L) - 100  CO2 22 - 32 mmol/L 35(H) - 28  Calcium 8.9 - 10.3 mg/dL 8.4(L) -  8.3(L)  Total Protein 6.5 - 8.1 g/dL 5.5(L) - -  Total Bilirubin 0.3 - 1.2 mg/dL 0.3 - -  Alkaline Phos 38 - 126 U/L 66 - -  AST 15 - 41 U/L 43(H) - -  ALT 0 - 44 U/L 57(H) - -    CBC Latest Ref Rng & Units 05/20/2020 05/20/2020 04/12/2020  WBC 4.0 - 10.5 K/uL 11.9(H) - 7.8  Hemoglobin 12.0 - 15.0 g/dL 6.4(LL) 6.8(LL) 8.0(L)  Hematocrit 36 - 46 % 21.8(L) 20.0(L) 26.0(L)  Platelets 150 - 400 K/uL 382 - 198    ABG    Component Value Date/Time   PHART 7.331 (L) 05/20/2020 1141   PCO2ART 76.1 (HH) 05/20/2020 1141   PO2ART 145 (H) 05/20/2020 1141   HCO3 40.3 (H) 05/20/2020 1141   TCO2 43 (H) 05/20/2020 1141   O2SAT 99.0 05/20/2020 1141    Review of Systems:   Reviewed and negative  Past Medical History  She,  has a past medical history of Anemia, Aortic arch anomaly, Breast cancer ([place]) (09/22/2015), Breast cancer of upper-outer quadrant of left female breast ([place]) (09/08/2015), Cataract, immature, CHF (congestive heart failure) ([place]), COPD (chronic obstructive pulmonary disease) ([place]), Femur fracture, left ([place]) (04/11/2020), Heart murmur, History of hyperthyroidism, Hyperlipidemia, Hypertension, Hypokalemia, Hypothyroidism, Nonfunctioning kidney, Personal history of radiation therapy, Pulmonary nodules, Radiation (10/30/15-11/28/15), Renal artery stenosis ([place]), Tobacco abuse, and Wears partial dentures.   Surgical History    Past Surgical History:  Procedure Laterality Date  . ABDOMINAL ANGIOGRAM  02/18/2012   Procedure: ABDOMINAL ANGIOGRAM;  Surgeon: Lorretta Harp, MD;  Location: [facility] CATH LAB;  Service: Cardiovascular;;  . ABDOMINAL AORTAGRAM  07/04/2014  . ABDOMINAL HYSTERECTOMY  ~ 1977   partial  . APPENDECTOMY    . ARCH AORTOGRAM    . BREAST LUMPECTOMY Left 09/22/2015   Malignant  . CAROTID ANGIOGRAM N/A 02/18/2012   Procedure: CAROTID ANGIOGRAM;  Surgeon: Lorretta Harp, MD;  Location: [facility] CATH LAB;  Service: Cardiovascular;  Laterality: N/A;  . ENDARTERECTOMY  04/02/2012    Procedure: ENDARTERECTOMY CAROTID;  Surgeon: Serafina Mitchell, MD;  Location: [facility] OR;  Service: Vascular;  Laterality: Right;  . FUDUCIAL PLACEMENT Bilateral 09/02/2018   Procedure: Placement Of Fiducial to right upper lobe & left upper lobe lung;  Surgeon: [name], MD;  Location: [facility] OR;  Service: Thoracic;  Laterality: Bilateral;  . IR THORACENTESIS ASP PLEURAL SPACE W/IMG GUIDE  07/08/2018  . RADIOACTIVE SEED GUIDED PARTIAL MASTECTOMY WITH AXILLARY SENTINEL LYMPH NODE BIOPSY Left 09/22/2015   Procedure: INJECT BLUE DYE LEFT BREAST,RADIOACTIVE SEED GUIDED PARTIAL MASTECTOMY WITH AXILLARY SENTINEL LYMPH NODE BIOPSY;  Surgeon: Fanny Skates, MD;  Location: [place];  Service: General;  Laterality: Left;  . RENAL ANGIOGRAM Left 06/08/2010   renal artery stent -  5x12 Genesis on Aviator balloon stent (Dr. Adora Fridge)  .  RENAL ANGIOGRAM Right 07/04/2014   Procedure: RENAL ANGIOGRAM;  Surgeon: Lorretta Harp, MD;  Location: [facility] CATH LAB;  Service: Cardiovascular;  Laterality: Right;  . RENAL ANGIOGRAM Right 08/22/2014   Procedure: RENAL ANGIOGRAM;  Surgeon: Lorretta Harp, MD;  Location: [facility] CATH LAB;  Service: Cardiovascular;  Laterality: Right;  . TONSILLECTOMY     as a child  . VIDEO BRONCHOSCOPY WITH ENDOBRONCHIAL NAVIGATION N/A 09/02/2018   Procedure: VIDEO BRONCHOSCOPY WITH ENDOBRONCHIAL NAVIGATION;  Surgeon: [name], MD;  Location: [facility] OR;  Service: Thoracic;  Laterality: N/A;     Social History   reports that she has been smoking cigarettes. She has a 20.00 pack-year smoking history. She has never used smokeless tobacco. She reports current alcohol use. She reports that she does not use drugs.   Family History   Her family history includes Breast cancer in her paternal aunt; Cancer in her father; Heart disease in her maternal grandmother and mother; Lung cancer in her father and sister.   Allergies No Known Allergies   Home Medications  Prior to Admission  medications   Medication Sig Start Date End Date Taking? Authorizing Provider  acetaminophen (TYLENOL) 325 MG tablet Take 325 mg by mouth every 6 (six) hours as needed for mild pain or moderate pain.   Yes [provider]  albuterol (VENTOLIN HFA) 108 (90 Base) MCG/ACT inhaler Inhale 2 puffs into the lungs every 6 (six) hours as needed for wheezing or shortness of breath. 02/02/19  Yes [name], MD  aspirin 81 MG tablet Take 1 tablet (81 mg total) by mouth daily. 03/21/20  Yes Hongalgi, Lenis Dickinson, MD  Calcium-Magnesium-Vitamin D (CALCIUM 1200+D3 PO) Take 1 tablet by mouth daily.   Yes [provider]  doxazosin (CARDURA) 8 MG tablet Take 8 mg by mouth daily.    Yes [provider]  furosemide (LASIX) 20 MG tablet Take 1 tablet (20 mg total) by mouth daily. 07/13/18 05/20/20 Yes Hall, Carole N, DO  ibandronate (BONIVA) 150 MG tablet Take 150 mg by mouth every 30 (thirty) days. 01/19/20  Yes [provider]  [place] (DERMACLOUD EX) Apply 1 application topically in the morning and at bedtime. Apply to Buttocks   Yes [provider]  levothyroxine (SYNTHROID) 50 MCG tablet Take 50 mcg by mouth daily. 12/08/19  Yes [provider]  metoprolol tartrate (LOPRESSOR) 25 MG tablet Take 25 mg by mouth daily. 02/19/20  Yes [provider]  Multiple Vitamins-Minerals (MULTIVITAMIN WITH MINERALS) tablet Take 1 tablet by mouth daily.    Yes [provider]  nicotine (NICODERM CQ - DOSED IN MG/24 HOURS) 14 mg/24hr patch Place 1 patch (14 mg total) onto the skin daily. 03/19/20  Yes Hongalgi, Lenis Dickinson, MD  OXYGEN Inhale 2-4 L into the lungs as needed. As needed to maintain SATS > 90%   Yes [provider]  pantoprazole (PROTONIX) 40 MG tablet Take 1 tablet (40 mg total) by mouth daily. 07/14/18  Yes Irene Pap N, DO  rosuvastatin (CRESTOR) 10 MG tablet Take 10 mg by mouth daily.    Yes [provider]  tamoxifen  (NOLVADEX) 20 MG tablet TAKE 1 TABLET BY MOUTH EVERY DAY Patient taking differently: Take 20 mg by mouth daily.  04/18/20  Yes Nicholas Lose, MD  Tiotropium Bromide-Olodaterol (STIOLTO RESPIMAT) 2.5-2.5 MCG/ACT AERS Inhale 2 puffs into the lungs daily. 10/27/19  Yes [name], MD     Signature:  Chesley Mires, MD  [place] Pager - [phone] 05/20/2020, 5:55 PM

## 2020-05-20 NOTE — ED Notes (Signed)
Pt O2 increased to 6L d/t sats decreasing to 88%.

## 2020-05-20 NOTE — ED Notes (Signed)
Yvone Neu [place]-emergency contact432 [phone] would like an update on the pt

## 2020-05-20 NOTE — ED Provider Notes (Signed)
Pt signed out to me by Dr Francia Greaves.  Briefly 74 yo female on 4L O2 chronically presenting with SOB at home.  Here requiring 6L [place].  Initially hypotensive, given 2L IVF with improvement of BP to SBP 110 mmhg currently.  '  Hgb 6.4 - stool guiac negative per Dr Francia Greaves.  Hgb was 8 one month ago.  Anticipate giving 1 unit pRBC after consent, f/u on CT PE scan, and admit to the hospital  On my assessment BP 107/67, HR 90, patient is requiring exactly 6L [place] to maintain SpO2 above 90-92% at rest. This is a new O2 requirement as she was on 4L [place] at home after last discharge 1 month ago.  She was verbally consented for a blood transfusion - 1 unit pRBC.  Will page for admission.    On reassesesment at 445 pm patient becoming hypoxic to 80% on 6L, switched to NRB , now 100%.  She has diffuse crackles.  Suspect she may now be volume overloaded.  I advised the fluids be stopped (2L out of 3L given).  I advised 40 mg IV lasix be given.  BP 89 systolic.  Update 515 - ICU team consulted to evaluate pt given borderline hypotension (MAP 65) and now requiring NRB mask for suspected volume overload.  I've asked the nurse to hold off on the blood transfusion for the moment.  CCU team consulted - recommending hospitalist/SDU admission, they will leave consult note.  Hospitalist notified.  Pt remains stable on NRB 100% O2.  Blood transfusion ordered cancelled given volume status.  .Critical Care Performed by: Wyvonnia Dusky, MD Authorized by: Wyvonnia Dusky, MD   Critical care provider statement:    Critical care time (minutes):  35   Critical care was necessary to treat or prevent imminent or life-threatening deterioration of the following conditions:  Respiratory failure   Critical care was time spent personally by me on the following activities:  Discussions with consultants, evaluation of patient's response to treatment, examination of patient, ordering and performing treatments and interventions, ordering  and review of laboratory studies, ordering and review of radiographic studies, pulse oximetry, re-evaluation of patient's condition, obtaining history from patient or surrogate and review of old charts Comments:     Volume overload requiring IV diuresis      Wyvonnia Dusky, MD 05/20/20 1815

## 2020-05-20 NOTE — ED Notes (Signed)
Pt with acute onset sob.  Sats dropped to 75% on 6L [address].  Improved to 98 on nrb.  Lung sounds wet.  Dr Langston Masker at bedside, ordered lasix.

## 2020-05-20 NOTE — ED Provider Notes (Signed)
[facility] EMERGENCY DEPARTMENT Provider Note   CSN: [id number] Arrival date & time: 05/20/20  1047     History Chief Complaint  Patient presents with  . Shortness of Breath    Kendra Watts is a 74 y.o. female.  74 year old female with prior medical history as detailed below presents for evaluation.  Patient reports that she has had vague shortness of breath for the last 2 to 3 days.  This morning as she was getting dressed she reports feeling very weak very suddenly.  EMS was called to her evaluation.  Upon their arrival they found her to be quite hypoxic on 2 L nasal cannula with an O2 sat in the mid 36s.  She was also noted to be mildly hypotensive.  EMS administered 100% FiO2 oxygen.  Upon arrival to the ED she feels significantly improved.  She denies complaints of chest pain or shortness of breath at present.  She is on 6 L nasal cannula and appears to be comfortable.  She denies recent fever.  She denies vomiting.  She denies nausea.  She denies abdominal pain.    The history is provided by the patient and medical records.  Illness Location:  Weakness, hypoxia, hypotension. Severity:  Moderate Onset quality:  Gradual Duration:  3 days Timing:  Constant Progression:  Worsening Chronicity:  New Associated symptoms: shortness of breath   Associated symptoms: no fever        Past Medical History:  Diagnosis Date  . Anemia   . 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 Breast cancer ([place]) 09/22/2015   Malignant  . Breast cancer of upper-outer quadrant of left female breast ([place]) 09/08/2015  . Cataract, immature   . CHF (congestive heart failure) ([place])    Acute CHF-06/2018  . COPD (chronic obstructive pulmonary disease) ([place])   . Femur fracture, left ([place]) 04/11/2020   left femur fracture( greater trochanter  . Heart murmur    states no known problems  . History of hyperthyroidism   . Hyperlipidemia   . Hypertension    states under control  with meds., has been on med. x "long time"  . Hypokalemia    from last physical.   . Hypothyroidism   . Nonfunctioning kidney    left  . Personal history of radiation therapy    2017  . Pulmonary nodules    Bilateral  . Radiation 10/30/15-11/28/15   left breast 42.72 Gy, boosted to 10 Gy  . Renal artery stenosis ([place])   . Tobacco abuse   . Wears partial dentures    upper and lower    Patient Active Problem List   Diagnosis Date Noted  . Multiple closed fractures of ribs of left side   . Fall   . Palliative care by specialist   . Goals of care, counseling/discussion   . Advanced directives, counseling/discussion   . Generalized weakness   . Fracture of one rib, left side, initial encounter for closed fracture 04/08/2020  . Fracture of greater trochanter of left femur ([place]) 04/08/2020  . Closed fracture of greater trochanter of left femur ([place]) 04/08/2020  . Epistaxis 03/13/2020  . Acute blood loss anemia 03/13/2020  . Current smoker   . Hypertension, uncontrolled   . 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 Hemoptysis 09/14/2018  . Squamous cell lung cancer, left ([place]) 09/10/2018  . Adenocarcinoma, lung, right ([place]) 09/10/2018  . Multiple lung nodules 07/20/2018  . Chronic obstructive  pulmonary disease ([place]) 07/20/2018  . Acute CHF (congestive heart failure) ([place]) 07/06/2018  . Chronic respiratory failure with hypoxia ([place]) 07/06/2018  . Hyponatremia 07/06/2018  . Anemia 07/06/2018  . Breast cancer of upper-outer quadrant of left female breast ([place]) 09/08/2015  . Renal artery stenosis ([place]) 08/23/2014  . Right upper extremity numbness 07/01/2013  . Atherosclerotic renal artery stenosis, bilateral ([place]) 05/26/2013  . Peripheral arterial disease ([place]) 05/26/2013  . Essential hypertension 05/26/2013  . Hyperlipidemia 05/26/2013  . Tobacco abuse 05/26/2013  . Carotid artery stenosis 03/09/2012    Past Surgical History:  Procedure Laterality Date  . ABDOMINAL  ANGIOGRAM  02/18/2012   Procedure: ABDOMINAL ANGIOGRAM;  Surgeon: Lorretta Harp, MD;  Location: [facility] CATH LAB;  Service: Cardiovascular;;  . ABDOMINAL AORTAGRAM  07/04/2014  . ABDOMINAL HYSTERECTOMY  ~ 1977   partial  . APPENDECTOMY    . ARCH AORTOGRAM    . BREAST LUMPECTOMY Left 09/22/2015   Malignant  . CAROTID ANGIOGRAM N/A 02/18/2012   Procedure: CAROTID ANGIOGRAM;  Surgeon: Lorretta Harp, MD;  Location: [facility] CATH LAB;  Service: Cardiovascular;  Laterality: N/A;  . ENDARTERECTOMY  04/02/2012   Procedure: ENDARTERECTOMY CAROTID;  Surgeon: Serafina Mitchell, MD;  Location: [facility] OR;  Service: Vascular;  Laterality: Right;  . FUDUCIAL PLACEMENT Bilateral 09/02/2018   Procedure: Placement Of Fiducial to right upper lobe & left upper lobe lung;  Surgeon: [name], MD;  Location: [facility] OR;  Service: Thoracic;  Laterality: Bilateral;  . IR THORACENTESIS ASP PLEURAL SPACE W/IMG GUIDE  07/08/2018  . RADIOACTIVE SEED GUIDED PARTIAL MASTECTOMY WITH AXILLARY SENTINEL LYMPH NODE BIOPSY Left 09/22/2015   Procedure: INJECT BLUE DYE LEFT BREAST,RADIOACTIVE SEED GUIDED PARTIAL MASTECTOMY WITH AXILLARY SENTINEL LYMPH NODE BIOPSY;  Surgeon: Fanny Skates, MD;  Location: [facility];  Service: General;  Laterality: Left;  . RENAL ANGIOGRAM Left 06/08/2010   renal artery stent -  5x12 Genesis on Aviator balloon stent (Dr. Adora Fridge)  . RENAL ANGIOGRAM Right 07/04/2014   Procedure: RENAL ANGIOGRAM;  Surgeon: Lorretta Harp, MD;  Location: [facility] CATH LAB;  Service: Cardiovascular;  Laterality: Right;  . RENAL ANGIOGRAM Right 08/22/2014   Procedure: RENAL ANGIOGRAM;  Surgeon: Lorretta Harp, MD;  Location[facility] CATH LAB;  Service: Cardiovascular;  Laterality: Right;  . TONSILLECTOMY     as a child  . VIDEO BRONCHOSCOPY WITH ENDOBRONCHIAL NAVIGATION N/A 09/02/2018   Procedure: VIDEO BRONCHOSCOPY WITH ENDOBRONCHIAL NAVIGATION;  Surgeon: [name], MD;  Location: [facility] OR;  Service: Thoracic;   Laterality: N/A;     OB History   No obstetric history on file.     Family History  Problem Relation Age of Onset  . Heart disease Mother        MI @ 4, died at 15  . Cancer Father   . Lung cancer Father   . Heart disease Maternal Grandmother   . Lung cancer Sister   . Breast cancer Paternal Aunt     Social History   Tobacco Use  . Smoking status: Current Every Day Smoker    Packs/day: 0.50    Years: 40.00    Pack years: 20.00    Types: Cigarettes  . Smokeless tobacco: Never Used  . Tobacco comment: as of 09/14/2018, less than 1 pack/day  Vaping Use  . Vaping Use: Never used  Substance Use Topics  . Alcohol use: Yes    Comment: occasionally  . Drug use: No    Home  Medications Prior to Admission medications   Medication Sig Start Date End Date Taking? Authorizing Provider  acetaminophen (TYLENOL) 325 MG tablet Take 325 mg by mouth every 6 (six) hours as needed for mild pain or moderate pain.   Yes [provider]  albuterol (VENTOLIN HFA) 108 (90 Base) MCG/ACT inhaler Inhale 2 puffs into the lungs every 6 (six) hours as needed for wheezing or shortness of breath. 02/02/19  Yes [name], MD  aspirin 81 MG tablet Take 1 tablet (81 mg total) by mouth daily. 03/21/20  Yes Hongalgi, Lenis Dickinson, MD  Calcium-Magnesium-Vitamin D (CALCIUM 1200+D3 PO) Take 1 tablet by mouth daily.   Yes [provider]  doxazosin (CARDURA) 8 MG tablet Take 8 mg by mouth daily.    Yes [provider]  furosemide (LASIX) 20 MG tablet Take 1 tablet (20 mg total) by mouth daily. 07/13/18 05/20/20 Yes Hall, Carole N, DO  ibandronate (BONIVA) 150 MG tablet Take 150 mg by mouth every 30 (thirty) days. 01/19/20  Yes [provider]  [place] (DERMACLOUD EX) Apply 1 application topically in the morning and at bedtime. Apply to Buttocks   Yes [provider]  levothyroxine (SYNTHROID) 50 MCG tablet Take 50 mcg by mouth daily. 12/08/19  Yes [provider]  metoprolol tartrate (LOPRESSOR) 25 MG tablet Take 25 mg by mouth daily. 02/19/20  Yes [provider]  Multiple Vitamins-Minerals (MULTIVITAMIN WITH MINERALS) tablet Take 1 tablet by mouth daily.    Yes [provider]  nicotine (NICODERM CQ - DOSED IN MG/24 HOURS) 14 mg/24hr patch Place 1 patch (14 mg total) onto the skin daily. 03/19/20  Yes Hongalgi, Lenis Dickinson, MD  OXYGEN Inhale 2-4 L into the lungs as needed. As needed to maintain SATS > 90%   Yes [provider]  pantoprazole (PROTONIX) 40 MG tablet Take 1 tablet (40 mg total) by mouth daily. 07/14/18  Yes Irene Pap N, DO  rosuvastatin (CRESTOR) 10 MG tablet Take 10 mg by mouth daily.    Yes [provider]  tamoxifen (NOLVADEX) 20 MG tablet TAKE 1 TABLET BY MOUTH EVERY DAY Patient taking differently: Take 20 mg by mouth daily.  04/18/20  Yes Nicholas Lose, MD  Tiotropium Bromide-Olodaterol (STIOLTO RESPIMAT) 2.5-2.5 MCG/ACT AERS Inhale 2 puffs into the lungs daily. 10/27/19  Yes [name], MD    Allergies    Patient has no known allergies.  Review of Systems   Review of Systems  Constitutional: Negative for fever.  Respiratory: Positive for shortness of breath.   All other systems reviewed and are negative.   Physical Exam Updated Vital Signs BP (!) 70/50 (BP Location: Left Arm)   Pulse 69   Temp 98.1 F (36.7 C) (Oral)   Resp (!) 25   SpO2 96%   Physical Exam Vitals and nursing note reviewed.  Constitutional:      General: She is not in acute distress.    Appearance: She is well-developed.  HENT:     Head: Normocephalic and atraumatic.  Eyes:     Conjunctiva/sclera: Conjunctivae normal.     Pupils: Pupils are equal, round, and reactive to light.  Cardiovascular:     Rate and Rhythm: Normal rate and regular rhythm.     Heart sounds: Normal heart sounds.  Pulmonary:     Effort: Pulmonary effort is normal. No respiratory distress.     Breath sounds: Examination of  the right-lower field reveals wheezing. Examination of the  left-lower field reveals wheezing. Wheezing present.  Abdominal:     General: There is no distension.     Palpations: Abdomen is soft.     Tenderness: There is no abdominal tenderness.  Musculoskeletal:        General: No deformity. Normal range of motion.     Cervical back: Normal range of motion and neck supple.  Skin:    General: Skin is warm and dry.  Neurological:     Mental Status: She is alert and oriented to person, place, and time.     ED Results / Procedures / Treatments   Labs (all labs ordered are listed, but only abnormal results are displayed) Labs Reviewed  COMPREHENSIVE METABOLIC PANEL - Abnormal; Notable for the following components:      Result Value   Chloride 96 (*)    CO2 35 (*)    Glucose, Bld 140 (*)    Calcium 8.4 (*)    Total Protein 5.5 (*)    Albumin 2.5 (*)    AST 43 (*)    ALT 57 (*)    All other components within normal limits  CBC WITH DIFFERENTIAL/PLATELET - Abnormal; Notable for the following components:   WBC 11.9 (*)    RBC 2.39 (*)    Hemoglobin 6.4 (*)    HCT 21.8 (*)    MCHC 29.4 (*)    RDW 17.5 (*)    Neutro Abs 10.1 (*)    Lymphs Abs 0.5 (*)    Abs Immature Granulocytes 0.08 (*)    All other components within normal limits  I-STAT ARTERIAL BLOOD GAS, ED - Abnormal; Notable for the following components:   pH, Arterial 7.331 (*)    pCO2 arterial 76.1 (*)    pO2, Arterial 145 (*)    Bicarbonate 40.3 (*)    TCO2 43 (*)    Acid-Base Excess 13.0 (*)    HCT 20.0 (*)    Hemoglobin 6.8 (*)    All other components within normal limits  TROPONIN I (HIGH SENSITIVITY) - Abnormal; Notable for the following components:   Troponin I (High Sensitivity) 90 (*)    All other components within normal limits  RESPIRATORY PANEL BY RT PCR (FLU A&B, COVID)  CULTURE, BLOOD (ROUTINE X 2)  CULTURE, BLOOD (ROUTINE X 2)  LACTIC ACID, PLASMA  PROTIME-INR  LACTIC ACID, PLASMA  POC OCCULT  BLOOD, ED  I-STAT CHEM 8, ED  TYPE AND SCREEN    EKG EKG Interpretation  Date/Time:  Saturday May 20 2020 12:40:51 EDT Ventricular Rate:  77 PR Interval:    QRS Duration: 70 QT Interval:  420 QTC Calculation: 476 R Axis:   67 Text Interpretation: Sinus rhythm Low voltage, precordial leads Confirmed by Dene Gentry [phone]) on 05/20/2020 12:50:22 PM   Radiology DG Chest Port 1 View  Result Date: 05/20/2020 CLINICAL DATA:  74 year old female with a history of shortness of breath EXAM: PORTABLE CHEST 1 VIEW COMPARISON:  04/08/2020, chest CT 04/08/2020 FINDINGS: Cardiomediastinal silhouette unchanged in size and contour. Interval development of interlobular septal thickening. Blunting of the right costophrenic angle new from the comparison. Coarsened interstitial markings. Surgical clips project over the bilateral lung apices. Calcifications of the aortic arch again noted. Left sided rib fractures again demonstrated better seen on prior CT IMPRESSION: New CHF with small right pleural effusion. Electronically Signed   By: Corrie Mckusick D.O.   On: 05/20/2020 11:49    Procedures Procedures (including critical care time)  Medications Ordered in ED  Medications  albuterol (PROVENTIL) (2.5 MG/3ML) 0.083% nebulizer solution 5 mg (has no administration in time range)  sodium chloride 0.9 % bolus 500 mL (has no administration in time range)  sodium chloride 0.9 % bolus 500 mL (has no administration in time range)  sodium chloride flush (NS) 0.9 % injection 3 mL (has no administration in time range)  sodium chloride 0.9 % bolus 1,000 mL (1,000 mLs Intravenous New Bag/Given 05/20/20 1420)    ED Course  I have reviewed the triage vital signs and the nursing notes.  Pertinent labs & imaging results that were available during my care of the patient were reviewed by me and considered in my medical decision making (see chart for details).    MDM Rules/Calculators/A&P                           MDM  Screen complete  KAEGAN STIGLER was evaluated in Emergency Department on 05/20/2020 for the symptoms described in the history of present illness. She was evaluated in the context of the global COVID-19 pandemic, which necessitated consideration that the patient might be at risk for infection with the SARS-CoV-2 virus that causes COVID-19. Institutional protocols and algorithms that pertain to the evaluation of patients at risk for COVID-19 are in a state of rapid change based on information released by regulatory bodies including the CDC and federal and state organizations. These policies and algorithms were followed during the patient's care in the ED.  Is presenting for evaluation of reported shortness of breath.  Patient was found to be hypotensive and hypoxic upon initial EMS evaluation  Fluids have improved the patient's blood pressure.  Patient is noted to be anemic with hemoglobin of 6.9.  She is guaiac negative from below.  CT imaging pending especially, CTA of the chest.  Patient does feel improved.  Patient will likely require admission for further work-up and treatment.  Dr. Langston Masker aware of pending re-eval and admission. Final Clinical Impression(s) / ED Diagnoses Final diagnoses:  SOB (shortness of breath)    Rx / DC Orders ED Discharge Orders    None       Valarie Merino, MD 05/20/20 [phone]

## 2020-05-20 NOTE — ED Triage Notes (Signed)
Pt had been complaining of SOB for 3 days at nursing home. Upon arrival EMS found pt laying on the floor and her O2 was 55% on [place] 2L. EMS placed pt on NRB and her O2 went to 100%. Pt is axo x4. Pt got 5 puffs of albuterol inhlaher via EMS. Pt has a hx of COPD and removal of lymph nodes on left side. Pts left side is restricted.

## 2020-05-20 NOTE — H&P (Addendum)
History and Physical    Kendra Watts  GQQ:[id number]  DOB: [birth year]  DOA: 05/20/2020 PCP: Merrilee Seashore, MD   Patient coming from: SNF  Chief Complaint: shortness of breath  HPI: Kendra Watts is a 74 y.o. female with medical history of a recent left femur fracture after a fall (discharged to SNF ron 9/22), HTN, COPD, Chronic respiratory failure on 4 L O2 at home, lung nodules with nodules positive in the past for sq cell lung CA , Breast cancer, acute blood loss anemia due to epistaxis in September who presents from SNF for dyspnea. Pulse ox noted by EMS was 55 % on 2 L.EMS placed her on a non rebreather and gave her an albuterol inhaler. Her oxygen level improved and oxygen was weaned down to 6 L. Pulse ox was 94%. While in the ED she was noted to have a BP of 70/50 and given 2 L IVF. She was also noted to have a Hb of 6.8 and was ordered blood (not yet transfused).  While in the ED she became progressively hypoxic and short of breath and needed 6 L O2 and then a non rebreather mask again.  The patient is unable to explain to me if at the facility she was short of breath at rest or with exertion. She has a mild cough which is not new. She has otherwise been doing well and recuperating from her fracture.   ED Course: See above.  - CTA (done before she became hypoxic the second time > mainly chronic changes in lungs and small bilateral pleural effusions. Acute to subacute left-sided rib fractures as detailed above without evidence for pneumothorax.  - ABG- pH 7.33, PCO2 76.1, pO2 145, pulse ox 99 - Given 40 mg IV Lasix after receiving 2 L IVF  Review of Systems:  All other systems reviewed and apart from HPI, are negative.  Past Medical History:  Diagnosis Date  . Anemia   . 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 Breast cancer ([place]) 09/22/2015   Malignant  . Breast cancer of upper-outer quadrant of left female breast ([place]) 09/08/2015  . Cataract, immature   . CHF  (congestive heart failure) ([place])    Acute CHF-06/2018  . COPD (chronic obstructive pulmonary disease) ([place])   . Femur fracture, left ([place]) 04/11/2020   left femur fracture( greater trochanter  . Heart murmur    states no known problems  . History of hyperthyroidism   . Hyperlipidemia   . Hypertension    states under control with meds., has been on med. x "long time"  . Hypokalemia    from last physical.   . Hypothyroidism   . Nonfunctioning kidney    left  . Personal history of radiation therapy    2017  . Pulmonary nodules    Bilateral  . Radiation 10/30/15-11/28/15   left breast 42.72 Gy, boosted to 10 Gy  . Renal artery stenosis ([place])   . Tobacco abuse   . Wears partial dentures    upper and lower    Past Surgical History:  Procedure Laterality Date  . ABDOMINAL ANGIOGRAM  02/18/2012   Procedure: ABDOMINAL ANGIOGRAM;  Surgeon: Lorretta Harp, MD;  Location: [facility] CATH LAB;  Service: Cardiovascular;;  . ABDOMINAL AORTAGRAM  07/04/2014  . ABDOMINAL HYSTERECTOMY  ~ 1977   partial  . APPENDECTOMY    . ARCH AORTOGRAM    . BREAST LUMPECTOMY Left 09/22/2015   Malignant  . CAROTID ANGIOGRAM  N/A 02/18/2012   Procedure: CAROTID ANGIOGRAM;  Surgeon: Lorretta Harp, MD;  Location: [facility] CATH LAB;  Service: Cardiovascular;  Laterality: N/A;  . ENDARTERECTOMY  04/02/2012   Procedure: ENDARTERECTOMY CAROTID;  Surgeon: Serafina Mitchell, MD;  Location[facility] OR;  Service: Vascular;  Laterality: Right;  . FUDUCIAL PLACEMENT Bilateral 09/02/2018   Procedure: Placement Of Fiducial to right upper lobe & left upper lobe lung;  Surgeon: [name], MD;  Location: [facility] OR;  Service: Thoracic;  Laterality: Bilateral;  . IR THORACENTESIS ASP PLEURAL SPACE W/IMG GUIDE  07/08/2018  . RADIOACTIVE SEED GUIDED PARTIAL MASTECTOMY WITH AXILLARY SENTINEL LYMPH NODE BIOPSY Left 09/22/2015   Procedure: INJECT BLUE DYE LEFT BREAST,RADIOACTIVE SEED GUIDED PARTIAL MASTECTOMY WITH AXILLARY SENTINEL LYMPH NODE BIOPSY;   Surgeon: Fanny Skates, MD;  Location: [place];  Service: General;  Laterality: Left;  . RENAL ANGIOGRAM Left 06/08/2010   renal artery stent -  5x12 Genesis on Aviator balloon stent (Dr. Adora Fridge)  . RENAL ANGIOGRAM Right 07/04/2014   Procedure: RENAL ANGIOGRAM;  Surgeon: Lorretta Harp, MD;  Location: [facility] CATH LAB;  Service: Cardiovascular;  Laterality: Right;  . RENAL ANGIOGRAM Right 08/22/2014   Procedure: RENAL ANGIOGRAM;  Surgeon: Lorretta Harp, MD;  Location: [facility] CATH LAB;  Service: Cardiovascular;  Laterality: Right;  . TONSILLECTOMY     as a child  . VIDEO BRONCHOSCOPY WITH ENDOBRONCHIAL NAVIGATION N/A 09/02/2018   Procedure: VIDEO BRONCHOSCOPY WITH ENDOBRONCHIAL NAVIGATION;  Surgeon: [name], MD;  Location: [place];  Service: Thoracic;  Laterality: N/A;    Social History:   reports that she has been smoking cigarettes. She has a 20.00 pack-year smoking history. She has never used smokeless tobacco. She reports current alcohol use. She reports that she does not use drugs.  No Known Allergies  Family History  Problem Relation Age of Onset  . Heart disease Mother        MI @ 32, died at 16  . Cancer Father   . Lung cancer Father   . Heart disease Maternal Grandmother   . Lung cancer Sister   . Breast cancer Paternal Aunt      Prior to Admission medications   Medication Sig Start Date End Date Taking? Authorizing Provider  acetaminophen (TYLENOL) 325 MG tablet Take 325 mg by mouth every 6 (six) hours as needed for mild pain or moderate pain.   Yes [provider]  albuterol (VENTOLIN HFA) 108 (90 Base) MCG/ACT inhaler Inhale 2 puffs into the lungs every 6 (six) hours as needed for wheezing or shortness of breath. 02/02/19  Yes [name], MD  aspirin 81 MG tablet Take 1 tablet (81 mg total) by mouth daily. 03/21/20  Yes Hongalgi, Lenis Dickinson, MD  Calcium-Magnesium-Vitamin D (CALCIUM 1200+D3 PO) Take 1 tablet by mouth daily.   Yes [provider]  doxazosin (CARDURA) 8 MG tablet Take 8 mg by mouth daily.    Yes [provider]  furosemide (LASIX) 20 MG tablet Take 1 tablet (20 mg total) by mouth daily. 07/13/18 05/20/20 Yes Hall, Carole N, DO  ibandronate (BONIVA) 150 MG tablet Take 150 mg by mouth every 30 (thirty) days. 01/19/20  Yes [provider]  [place] (DERMACLOUD EX) Apply 1 application topically in the morning and at bedtime. Apply to Buttocks   Yes [provider]  levothyroxine (SYNTHROID) 50 MCG tablet Take 50 mcg by mouth daily. 12/08/19  Yes [provider]  metoprolol tartrate (LOPRESSOR) 25 MG tablet Take 25 mg by mouth daily. 02/19/20  Yes [provider]  Multiple Vitamins-Minerals (MULTIVITAMIN WITH MINERALS) tablet Take 1 tablet by mouth daily.    Yes [provider]  nicotine (NICODERM CQ - DOSED IN MG/24 HOURS) 14 mg/24hr patch Place 1 patch (14 mg total) onto the skin daily. 03/19/20  Yes Hongalgi, Lenis Dickinson, MD  OXYGEN Inhale 2-4 L into the lungs as needed. As needed to maintain SATS > 90%   Yes [provider]  pantoprazole (PROTONIX) 40 MG tablet Take 1 tablet (40 mg total) by mouth daily. 07/14/18  Yes Irene Pap N, DO  rosuvastatin (CRESTOR) 10 MG tablet Take 10 mg by mouth daily.    Yes [provider]  tamoxifen (NOLVADEX) 20 MG tablet TAKE 1 TABLET BY MOUTH EVERY DAY Patient taking differently: Take 20 mg by mouth daily.  04/18/20  Yes Nicholas Lose, MD  Tiotropium Bromide-Olodaterol (STIOLTO RESPIMAT) 2.5-2.5 MCG/ACT AERS Inhale 2 puffs into the lungs daily. 10/27/19  Yes [name], MD    Physical Exam: Wt Readings from Last 3 Encounters:  04/08/20 40 kg  03/13/20 40.8 kg  03/03/20 40.8 kg   Vitals:   05/20/20 1415 05/20/20 1430 05/20/20 1445 05/20/20 1530  BP: (!) 90/52 (!) 88/52 (!) 90/55   Pulse: 75 79 78 89  Resp: 18 12 12  (!) 23  Temp:      TempSrc:      SpO2: 100% 100% 100% 98%       Constitutional:  Calm & comfortable Eyes: PERRLA, lids and conjunctivae normal ENT:  Mucous membranes are moist.  Pharynx clear of exudate   Normal dentition.  Neck: Supple, no masses  Respiratory:  RR in 20s Crackles in bases Cardiovascular:  S1 & S2 heard, regular rate and rhythm No Murmurs Abdomen:  Non distended No tenderness, No masses Bowel sounds normal Extremities:  No clubbing / cyanosis No pedal edema No joint deformity    Skin:  No rashes, lesions or ulcers Neurologic:  AAO x 3 CN 2-12 grossly intact Sensation intact Strength 5/5 in all 4 extremities Psychiatric:  Normal Mood and affect    Labs on Admission: I have personally reviewed following labs and imaging studies  CBC: Recent Labs  Lab 05/20/20 1141 05/20/20 1200  WBC  --  11.9*  NEUTROABS  --  10.1*  HGB 6.8* 6.4*  HCT 20.0* 21.8*  MCV  --  91.2  PLT  --  301   Basic Metabolic Panel: Recent Labs  Lab 05/20/20 1141 05/20/20 1200  NA 139 140  K 3.5 3.7  CL  --  96*  CO2  --  35*  GLUCOSE  --  140*  BUN  --  11  CREATININE  --  0.90  CALCIUM  --  8.4*   GFR: CrCl cannot be calculated (Unknown ideal weight.). Liver Function Tests: Recent Labs  Lab 05/20/20 1200  AST 43*  ALT 57*  ALKPHOS 66  BILITOT 0.3  PROT 5.5*  ALBUMIN 2.5*   No results for input(s): LIPASE, AMYLASE in the last 168 hours. No results for input(s): AMMONIA in the last 168 hours. Coagulation Profile: Recent Labs  Lab 05/20/20 1200  INR 1.1   Cardiac Enzymes: No results for input(s): CKTOTAL, CKMB, CKMBINDEX, TROPONINI in the last 168 hours. BNP (last 3 results) No results for input(s): PROBNP in the last 8760 hours. HbA1C: No results for input(s): HGBA1C in the last 72 hours. CBG:  No results for input(s): GLUCAP in the last 168 hours. Lipid Profile: No results for input(s): CHOL, HDL, LDLCALC, TRIG, CHOLHDL, LDLDIRECT in the last 72 hours. Thyroid Function Tests: No results for input(s):  TSH, T4TOTAL, FREET4, T3FREE, THYROIDAB in the last 72 hours. Anemia Panel: No results for input(s): VITAMINB12, FOLATE, FERRITIN, TIBC, IRON, RETICCTPCT in the last 72 hours. Urine analysis:    Component Value Date/Time   COLORURINE STRAW (A) 03/13/2020 1128   APPEARANCEUR CLEAR 03/13/2020 1128   LABSPEC 1.006 03/13/2020 1128   PHURINE 6.0 03/13/2020 1128   GLUCOSEU NEGATIVE 03/13/2020 1128   HGBUR NEGATIVE 03/13/2020 1128   BILIRUBINUR NEGATIVE 03/13/2020 1128   KETONESUR NEGATIVE 03/13/2020 1128   PROTEINUR NEGATIVE 03/13/2020 1128   UROBILINOGEN 1.0 03/27/2012 1119   NITRITE NEGATIVE 03/13/2020 1128   LEUKOCYTESUR NEGATIVE 03/13/2020 1128   Sepsis Labs: @LABRCNTIP (procalcitonin:4,lacticidven:4) ) Recent Results (from the past 240 hour(s))  Respiratory Panel by RT PCR (Flu A&B, Covid) - Nasopharyngeal Swab     Status: None   Collection Time: 05/20/20 12:11 PM   Specimen: Nasopharyngeal Swab  Result Value Ref Range Status   SARS Coronavirus 2 by RT PCR NEGATIVE NEGATIVE Final    Comment: (NOTE) SARS-CoV-2 target nucleic acids are NOT DETECTED.  The SARS-CoV-2 RNA is generally detectable in upper respiratoy specimens during the acute phase of infection. The lowest concentration of SARS-CoV-2 viral copies this assay can detect is 131 copies/mL. A negative result does not preclude SARS-Cov-2 infection and should not be used as the sole basis for treatment or other patient management decisions. A negative result may occur with  improper specimen collection/handling, submission of specimen other than nasopharyngeal swab, presence of viral mutation(s) within the areas targeted by this assay, and inadequate number of viral copies (<131 copies/mL). A negative result must be combined with clinical observations, patient history, and epidemiological information. The expected result is Negative.  Fact Sheet for Patients:  PinkCheek.be  Fact Sheet for  Healthcare Providers:  GravelBags.it  This test is no t yet approved or cleared by the [place] FDA and  has been authorized for detection and/or diagnosis of SARS-CoV-2 by FDA under an Emergency Use Authorization (EUA). This EUA will remain  in effect (meaning this test can be used) for the duration of the COVID-19 declaration under Section 564(b)(1) of the Act, 21 U.S.C. section 360bbb-3(b)(1), unless the authorization is terminated or revoked sooner.     Influenza A by PCR NEGATIVE NEGATIVE Final   Influenza B by PCR NEGATIVE NEGATIVE Final    Comment: (NOTE) The Xpert Xpress SARS-CoV-2/FLU/RSV assay is intended as an aid in  the diagnosis of influenza from Nasopharyngeal swab specimens and  should not be used as a sole basis for treatment. Nasal washings and  aspirates are unacceptable for Xpert Xpress SARS-CoV-2/FLU/RSV  testing.  Fact Sheet for Patients: PinkCheek.be  Fact Sheet for Healthcare Providers: GravelBags.it  This test is not yet approved or cleared by the [place] FDA and  has been authorized for detection and/or diagnosis of SARS-CoV-2 by  FDA under an Emergency Use Authorization (EUA). This EUA will remain  in effect (meaning this test can be used) for the duration of the  Covid-19 declaration under Section 564(b)(1) of the Act, 21  U.S.C. section 360bbb-3(b)(1), unless the authorization is  terminated or revoked. Performed at [facility], [place] [address] [place], [place] [address]      Radiological Exams on Admission: CT Head Wo Contrast  Result Date: 05/20/2020  CLINICAL DATA:  Syncope.  Shortness of breath for 3 days. EXAM: CT HEAD WITHOUT CONTRAST TECHNIQUE: Contiguous axial images were obtained from the base of the skull through the vertex without intravenous contrast. COMPARISON:  April 08, 2020 FINDINGS: Brain: No subdural, epidural, or  subarachnoid hemorrhage. Cerebellum, brainstem, and basal cisterns are normal ventricles and sulci are stable. Mild white matter changes are identified. No acute cortical ischemia or infarct. No mass effect or midline shift. Vascular: Calcified atherosclerosis in the intracranial carotids. Skull: Normal. Negative for fracture or focal lesion. Sinuses/Orbits: No acute finding. Other: None. IMPRESSION: 1. No acute intracranial abnormalities identified. Chronic white matter changes. Electronically Signed   By: Dorise Bullion III M.D   On: 05/20/2020 15:36   CT Angio Chest PE W and/or Wo Contrast  Result Date: 05/20/2020 CLINICAL DATA:  Syncope.  Shortness of breath. EXAM: CT ANGIOGRAPHY CHEST WITH CONTRAST TECHNIQUE: Multidetector CT imaging of the chest was performed using the standard protocol during bolus administration of intravenous contrast. Multiplanar CT image reconstructions and MIPs were obtained to evaluate the vascular anatomy. CONTRAST:  69mL OMNIPAQUE IOHEXOL 350 MG/ML SOLN COMPARISON:  04/08/2020 FINDINGS: Cardiovascular: Contrast injection is sufficient to demonstrate satisfactory opacification of the pulmonary arteries to the segmental level. There is no pulmonary embolus or evidence of right heart strain. 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 This may have worsened since the prior study but is suboptimally evaluated secondary to contrast timing. The right vertebral artery is not well visualized on this study which appears to be a new finding since April 08, 2020 the main pulmonary artery is significantly dilated measuring up to approximately 3.5 cm in diameter. There are atherosclerotic changes throughout the coronary arteries and thoracic aorta without evidence for an aneurysm. There is no significant pericardial effusion. Mediastinum/Nodes: --there is apparent interval development  of an enlarged subcarinal lymph node measuring up to approximately 1.7 cm. --there is mild new right hilar adenopathy. -- No axillary lymphadenopathy. -- No supraclavicular lymphadenopathy. -- Normal thyroid gland where visualized. -  Unremarkable esophagus. Lungs/Pleura: There are moderate severe emphysematous changes bilaterally. There is interlobular septal thickening. Fiducial markers are noted in the right upper lobe. There is relatively stable nodular densities at the right lung apex. There is atelectasis at the lung bases, right worse than left. There are relatively stable areas of scarring atelectasis in the left upper lobe with adjacent fiducial markers. There is a small right-sided pleural effusion. There is a trace left-sided pleural effusion. There is no pneumothorax. There is a persistent, relatively unchanged 1 cm spiculated nodule in the right lower lobe, suboptimally evaluated on this study secondary to adjacent airspace disease (axial series 6, image 104). Upper Abdomen: Contrast bolus timing is not optimized for evaluation of the abdominal organs. The visualized portions of the organs of the upper abdomen are normal. There is a high-grade stenosis of the origin of the celiac axis. Musculoskeletal: There is an old compression fracture of the T3 vertebral body. No new compression fracture identified on this study. There are acute to subacute mildly displaced fractures involving the anterolateral third and fourth ribs on the left. There is nonspecific skin thickening involving the patient's left breast which appears to have progressed since the prior study. Review of the MIP images confirms the above findings. IMPRESSION: 1. No evidence for an acute pulmonary embolism 2. 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 This artery demonstrates  a long segment high-grade stenosis proximally, which appears to have worsened since the prior study dated 04/08/2020. As such, no contrast is  visualized in the right subclavian or right axillary arteries in addition to the right vertebral artery. These findings are not well evaluated secondary to contrast timing. Correlation with the patient's right upper extremity pulses is recommended. Additionally, a carotid artery ultrasound may be useful to detect flow within the right vertebral artery. This combination of findings may predispose the patient to a steal phenomenon. 3. Acute to subacute left-sided rib fractures as detailed above without evidence for pneumothorax. 4. Moderate severe emphysematous changes bilaterally with superimposed interstitial edema. There are small bilateral pleural effusions. 5. Cardiomegaly with findings of pulmonary artery hypertension. 6. Relatively stable nodular opacities in both lungs, somewhat limited in evaluation on today's study secondary to the presence of airspace disease and pleural effusions. 7. Apparent new subcarinal and mild right hilar adenopathy of unknown clinical significance. A repeat examination in 3 months with contrast should be performed to confirm stability or resolution. 8. Apparent new skin thickening overlying the left breast. Correlation with physical exam is recommended. Mammography is recommended if not recently performed. Aortic Atherosclerosis (ICD10-I70.0) and Emphysema (ICD10-J43.9). Electronically Signed   By: Constance Holster M.D.   On: 05/20/2020 15:47   DG Chest Port 1 View  Result Date: 05/20/2020 CLINICAL DATA:  74 year old female with a history of shortness of breath EXAM: PORTABLE CHEST 1 VIEW COMPARISON:  04/08/2020, chest CT 04/08/2020 FINDINGS: Cardiomediastinal silhouette unchanged in size and contour. Interval development of interlobular septal thickening. Blunting of the right costophrenic angle new from the comparison. Coarsened interstitial markings. Surgical clips project over the bilateral lung apices. Calcifications of the aortic arch again noted. Left sided rib  fractures again demonstrated better seen on prior CT IMPRESSION: New CHF with small right pleural effusion. Electronically Signed   By: Corrie Mckusick D.O.   On: 05/20/2020 11:49    EKG: Independently reviewed. Normal Sinus rhythm   Assessment/Plan Active Problems:   Pulmonary edema- acute diastolic CHF  Acute on chronic hypoxic respiratory failure - COPD- ? exacerbation - initially short of breath possibly due to a COPD exacerbation,s per ED physician and her nurse it resolved after the albuterol inhaler - second episode in ED likely due to fluid overload from 2 L IVF- pulse ox is in mid 80s on 4 L -  I will give her a second dose of 40 mg of IV Lasix now- watch BP- reassess in AM - repeat an ECHO- last ECHO from 2019 - cont COPD treatment per pulmonary (nebs inhalers)- hold off on steroids for now  Hypotension  - initial BP 70/50 now SBP in 90s - improved with 2 L IVF- ? 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  Rib fractures - left sided- likely from last fall when she was admitted for a femur fracture- no acute pain  Apparent new skin thickening overlying the left breast - left breast is slightly erythematous - patient has not noted any acute changes- follow  DVT prophylaxis: enoxaparin (LOVENOX) injection 40 mg Start: 05/20/20 1845 SCDs Start: 05/20/20 1842   Code Status: Full code- she does not want to remain on the  ventilator is there are not signs of improvement  Family Communication:   Disposition Plan:  Remains inpatient appropriate because:Hemodynamically unstable, needs IV diuresis, needs blood transfusion for symptomatic anemia- expect more than 2 midnight stay to resolve her acute medical issues  Consults called: PCCM called by ED  Admission status: inpatient    Debbe Odea MD [facility] Pager: [email or web address] Password [email or web address] 7PM-7AM, please contact night-coverage   05/20/2020, 5:41 PM

## 2020-05-21 ENCOUNTER — Other Ambulatory Visit: Payer: Self-pay

## 2020-05-21 ENCOUNTER — Inpatient Hospital Stay (HOSPITAL_COMMUNITY): Payer: Medicare Other

## 2020-05-21 DIAGNOSIS — J9611 Chronic respiratory failure with hypoxia: Secondary | ICD-10-CM

## 2020-05-21 DIAGNOSIS — J810 Acute pulmonary edema: Secondary | ICD-10-CM | POA: Diagnosis not present

## 2020-05-21 DIAGNOSIS — J181 Lobar pneumonia, unspecified organism: Secondary | ICD-10-CM

## 2020-05-21 DIAGNOSIS — J449 Chronic obstructive pulmonary disease, unspecified: Secondary | ICD-10-CM

## 2020-05-21 DIAGNOSIS — J9612 Chronic respiratory failure with hypercapnia: Secondary | ICD-10-CM | POA: Diagnosis not present

## 2020-05-21 DIAGNOSIS — I5021 Acute systolic (congestive) heart failure: Secondary | ICD-10-CM

## 2020-05-21 DIAGNOSIS — I517 Cardiomegaly: Secondary | ICD-10-CM | POA: Diagnosis not present

## 2020-05-21 LAB — BASIC METABOLIC PANEL
Anion gap: 10 (ref 5–15)
BUN: 10 mg/dL (ref 8–23)
CO2: 37 mmol/L — ABNORMAL HIGH (ref 22–32)
Calcium: 8.2 mg/dL — ABNORMAL LOW (ref 8.9–10.3)
Chloride: 94 mmol/L — ABNORMAL LOW (ref 98–111)
Creatinine, Ser: 0.8 mg/dL (ref 0.44–1.00)
GFR, Estimated: >60 mL/min (ref >60)
Glucose, Bld: 95 mg/dL (ref 70–99)
Potassium: 3.3 mmol/L — ABNORMAL LOW (ref 3.5–5.1)
Sodium: 141 mmol/L (ref 135–145)

## 2020-05-21 LAB — ECHOCARDIOGRAM COMPLETE
Area-P 1/2: 5.42 cm2
Height: 60 in
S' Lateral: 2.7 cm
Single Plane A4C EF: 67.8 %
Weight: 1548.8 oz

## 2020-05-21 LAB — CBC
HCT: 23.1 % — ABNORMAL LOW (ref 36.0–46.0)
Hemoglobin: 7 g/dL — ABNORMAL LOW (ref 12.0–15.0)
MCH: 26.8 pg (ref 26.0–34.0)
MCHC: 30.3 g/dL (ref 30.0–36.0)
MCV: 88.5 fL (ref 80.0–100.0)
Platelets: 433 10*3/uL — ABNORMAL HIGH (ref 150–400)
RBC: 2.61 MIL/uL — ABNORMAL LOW (ref 3.87–5.11)
RDW: 17.9 % — ABNORMAL HIGH (ref 11.5–15.5)
WBC: 9 10*3/uL (ref 4.0–10.5)
nRBC: 0 % (ref 0.0–0.2)

## 2020-05-21 MED ORDER — FUROSEMIDE 10 MG/ML IJ SOLN
40.0000 mg | Freq: Once | INTRAMUSCULAR | Status: AC
  Administered 2020-05-21: 40 mg via INTRAVENOUS

## 2020-05-21 MED ORDER — FUROSEMIDE 10 MG/ML IJ SOLN
40.0000 mg | Freq: Once | INTRAMUSCULAR | Status: DC
  Filled 2020-05-21: qty 4

## 2020-05-21 MED ORDER — AZITHROMYCIN 500 MG PO TABS
500.0000 mg | ORAL_TABLET | Freq: Every day | ORAL | Status: DC
  Administered 2020-05-21 – 2020-05-23 (×3): 500 mg via ORAL
  Filled 2020-05-21 (×3): qty 1

## 2020-05-21 MED ORDER — SODIUM CHLORIDE 0.9 % IV SOLN
100.0000 mg | Freq: Two times a day (BID) | INTRAVENOUS | Status: DC
  Filled 2020-05-21 (×2): qty 100

## 2020-05-21 MED ORDER — SODIUM CHLORIDE 0.9 % IV SOLN
2.0000 g | INTRAVENOUS | Status: DC
  Administered 2020-05-21 – 2020-05-22 (×2): 2 g via INTRAVENOUS
  Filled 2020-05-21 (×2): qty 20

## 2020-05-21 NOTE — Progress Notes (Signed)
  Echocardiogram 2D Echocardiogram has been performed.  Merrie Roof F 05/21/2020, 12:07 PM

## 2020-05-21 NOTE — Progress Notes (Signed)
NAME:  Kendra Watts, MRN:  [id number], DOB:  29-May-1946, LOS: 1 ADMISSION DATE:  05/20/2020, CONSULTATION DATE:  05/20/2020 REFERRING MD:  Dr. Langston Masker, ER, CHIEF COMPLAINT:  Short of breath   Brief History   74 yo female smoker fell at nursing home and had more shortness of breath for past 3 days.  In ER found to have hypoxia with SpO2 55% on 2 liters.  Also noted to have anemia and hypotension.  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  PCCM asked to assist with respiratory management.  History of present illness   She is followed by Dr. Lamonte Sakai in pulmonary office.  She has severe COPD on home oxygen.  She also has history of concurrent b/l primary lung cancer.  She was in NH and fell.  She hurt her side.  She has been feeling more short of breath since.  Has dry cough.  No headache, dizziness, fever, abdominal pain, diarrhea, melena, or leg swelling.  Found to have hypoxia and hypotension in ER.  Given fluid bolus and supplemental oxygen.  Then EDP concerned she had crackles on exam and lasix ordered.  She is being set up for PRBC transfusion.  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  Not having cough or chest pain.  Asked about whether possible mold exposure at her house could impact her breathing.  Objective   Blood pressure 112/73, pulse 97, temperature 99.1 F (37.3 C), temperature source Oral, resp. rate 18, height 5' (1.524 m), weight 43.9 kg, SpO2 95 %.        Intake/Output Summary (Last 24 hours) at 05/21/2020 1455 Last data filed at 05/21/2020 1059 Gross per 24 hour  Intake 1980.42 ml  Output 1500 ml  Net 480.42 ml   Filed Weights   05/20/20 2022 05/21/20 0406  Weight: 43.2 kg 43.9 kg    Examination:  General - alert Eyes - pupils reactive ENT - no sinus tenderness, no stridor Cardiac - regular rate/rhythm, no murmur Chest - decreased BS, no wheeze Abdomen - soft, non tender, + bowel sounds Extremities - no cyanosis, clubbing, or edema Skin - no rashes Neuro - normal strength, moves extremities, follows commands Psych - normal mood and behavior  Resolved Hospital Problem list     Assessment & Plan:   Acute on chronic hypoxic, hypercapnic respiratory failure from interstitial edema and 3rd/4th Lt rib fractures in setting of severe COPD with emphysema. - goal SpO2 90 to 95% - continue yupelri, brovana, prn albuterol - ABx per primary team - bronchial hygiene  Acute on chronic diastolic CHF. - even  to negative fluid balance  Iron deficiency nemia. - per primary team  Hypotension. - resolved  Mediastinal adenopathy with hx of breast cancer and lung cancer. - will need f/u imaging as an outpt - previously followed by [name] with oncology and Dr. Sondra Come with radiation oncology   Labs    CMP Latest Ref Rng & Units 05/21/2020 05/20/2020 05/20/2020  Glucose 70 - 99 mg/dL 95 - 140(H)  BUN 8 - 23 mg/dL 10 - 11  Creatinine 0.44 - 1.00 mg/dL 0.80 0.74 0.90  Sodium 135 - 145 mmol/L 141 - 140  Potassium 3.5 - 5.1 mmol/L 3.3(L) - 3.7    Chloride 98 - 111 mmol/L 94(L) - 96(L)  CO2 22 - 32 mmol/L 37(H) - 35(H)  Calcium 8.9 - 10.3 mg/dL 8.2(L) - 8.4(L)  Total Protein 6.5 - 8.1 g/dL - - 5.5(L)  Total Bilirubin 0.3 - 1.2 mg/dL - - 0.3  Alkaline Phos 38 - 126 U/L - - 66  AST 15 - 41 U/L - - 43(H)  ALT 0 - 44 U/L - - 57(H)    CBC Latest Ref Rng & Units 05/21/2020 05/20/2020 05/20/2020  WBC 4.0 - 10.5 K/uL 9.0 11.7(H) 11.9(H)  Hemoglobin 12.0 - 15.0 g/dL 7.0(L) 6.7(LL) 6.4(LL)  Hematocrit 36 - 46 % 23.1(L) 22.4(L) 21.8(L)  Platelets 150 - 400 K/uL 433(H) 429(H) 382    ABG    Component Value Date/Time   PHART 7.331 (L) 05/20/2020 1141   PCO2ART 76.1 (HH) 05/20/2020 1141   PO2ART 145 (H) 05/20/2020 1141   HCO3 40.3 (H) 05/20/2020 1141   TCO2 43 (H) 05/20/2020 1141   O2SAT 99.0 05/20/2020 1141    Signature:  Chesley Mires, MD [place] Pager - [phone] 05/21/2020, 2:55 PM

## 2020-05-21 NOTE — Progress Notes (Signed)
PROGRESS NOTE    Kendra Watts   GXQ:[id number]  DOB: 09-17-1945  DOA: 05/20/2020 PCP: Merrilee Seashore, MD   Brief Narrative:  Kendra Watts is a 74 y.o. female with medical history of a recent left femur fracture after a fall (discharged to SNF ron 9/22), HTN, COPD, Chronic respiratory failure on 4 L O2 at home, lung nodules with nodules positive in the past for sq cell lung CA , Breast cancer, acute blood loss anemia due to epistaxis in September who presents from SNF for dyspnea. Pulse ox noted by EMS was 55 % on 2 L.EMS placed her on a non rebreather and gave her an albuterol inhaler. Her oxygen level improved and oxygen was weaned down to 6 L. Pulse ox was 94%. While in the ED she was noted to have a BP of 70/50 and given 2 L IVF. She was also noted to have a Hb of 6.8 and was ordered blood (not yet transfused).  While in the ED she became progressively hypoxic and short of breath and needed 6 L O2 and then a non rebreather mask again.  The patient is unable to explain to me if at the facility she was short of breath at rest or with exertion. She has a mild cough which is not new. She has otherwise been doing well and recuperating from her fracture.   ED Course: See above.  - CTA (done before she became hypoxic the second time > mainly chronic changes in lungs and small bilateral pleural effusions. Acute to subacute left-sided rib fractures as detailed above without evidence for pneumothorax.  - ABG- pH 7.33, PCO2 76.1, pO2 145, pulse ox 99 - Given 40 mg IV Lasix after receiving 2 L IVF  Subjective: She has coughed up green/ bloody sputum today. She did not mention this yesterday. She states it been going on for 3 days now.     Assessment & Plan:   Active Problems:    Pulmonary edema- acute diastolic CHF  Acute on chronic hypoxic respiratory failure - COPD- ? Exacerbation - Pneumonia - initially short of breath possibly due to a COPD exacerbation, per ED physician  and her nurse it resolved after the albuterol inhaler but she was still requiring 6 L o2 - second episode in ED likely due to fluid overload from 2 L IVF- pulse ox is in mid 80s on 4 L - Awaiting ECHO - cont nebs/ inhalers  - will continue Lasix today while watching BP - cough with yellow green sputum and blood- dyspnea on exertion with hypoxia, crackles at both bases- CT showing some consolidation/ atelectasis R > L - added Ceftriaxone and Azithromycin for pneumonia  Hypotension  - initial BP 70/50  - improved with 2 L IVF- ? If she was initially over diursed (on Lasix as outpt) in setting of using Metoprolol and Doxazosin along with anemia - holding antihypertensives- BP 95/82 today  Anemia, normocytic - Hemoccult neg - she states she has not had any recent nose bleeds or blood in stools -  anemia panel> low Iron and Iron saturation, Ferritin may be falsely elevated in setting of infection - she has been typed and crossed and per RN, blood is on hold - Hb 7 today- will transfuse 1 U PRBC (cont Lasix)   Breast Cancer - cont Tamoxifen  Lung nodules with h/o lung CA   - outpt follow up with contrast CT in 3 months recommended  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  Rib fractures - left sided- likely from last fall when she was admitted for a femur fracture- no acute pain  Apparent new skin thickening overlying the left breast - left breast is slightly erythematous - patient has not noted any acute changes- follow   Time spent in minutes: 35 DVT prophylaxis: enoxaparin (LOVENOX) injection 40 mg Start: 05/20/20 1900 SCDs Start: 05/20/20 1842 Code Status: Full code but does not want to be left on vent if she has no hope of coming off of it Family Communication:  Disposition Plan:  Status is: Inpatient  Remains inpatient appropriate because:IV treatments appropriate due to intensity of illness or inability to take PO   Dispo: The  patient is from: SNF              Anticipated d/c is to: SNF              Anticipated d/c date is: 3 days              Patient currently is not medically stable to d/c.      Consultants:   none Procedures:   none Antimicrobials:  Anti-infectives (From admission, onward)   Start     Dose/Rate Route Frequency Ordered Stop   05/21/20 1000  doxycycline (VIBRAMYCIN) 100 mg in sodium chloride 0.9 % 250 mL IVPB        100 mg 125 mL/hr over 120 Minutes Intravenous Every 12 hours 05/21/20 0927         Objective: Vitals:   05/20/20 2022 05/21/20 0406 05/21/20 0724 05/21/20 0818  BP: 102/60 94/60  95/82  Pulse: 89 98  96  Resp: 17 19  16   Temp: 98.6 F (37 C) 98.5 F (36.9 C)  99.1 F (37.3 C)  TempSrc: Oral Oral  Oral  SpO2: 100% 90% 96% 94%  Weight: 43.2 kg 43.9 kg    Height: 5' (1.524 m)       Intake/Output Summary (Last 24 hours) at 05/21/2020 1158 Last data filed at 05/21/2020 1059 Gross per 24 hour  Intake 1980.42 ml  Output 1500 ml  Net 480.42 ml   Filed Weights   05/20/20 2022 05/21/20 0406  Weight: 43.2 kg 43.9 kg    Examination: General exam: Appears comfortable  HEENT: PERRLA, oral mucosa moist, no sclera icterus or thrush Respiratory system: Crackles at bases Cardiovascular system: S1 & S2 heard, RRR.   Gastrointestinal system: Abdomen soft, non-tender, nondistended. Normal bowel sounds. Central nervous system: Alert and oriented. No focal neurological deficits. Extremities: No cyanosis, clubbing or edema Skin: No rashes or ulcers Psychiatry:  Mood & affect appropriate.     Data Reviewed: I have personally reviewed following labs and imaging studies  CBC: Recent Labs  Lab 05/20/20 1141 05/20/20 1200 05/20/20 1832 05/21/20 0843  WBC  --  11.9* 11.7* 9.0  NEUTROABS  --  10.1*  --   --   HGB 6.8* 6.4* 6.7* 7.0*  HCT 20.0* 21.8* 22.4* 23.1*  MCV  --  91.2 90.0 88.5  PLT  --  382 429* 009*   Basic Metabolic Panel: Recent Labs  Lab  05/20/20 1141 05/20/20 1200 05/20/20 1832 05/21/20 0843  NA 139 140  --  141  K 3.5 3.7  --  3.3*  CL  --  96*  --  94*  CO2  --  35*  --  37*  GLUCOSE  --  140*  --  95  BUN  --  11  --  10  CREATININE  --  0.90 0.74 0.80  CALCIUM  --  8.4*  --  8.2*   GFR: Estimated Creatinine Clearance: 42.8 mL/min (by C-G formula based on SCr of 0.8 mg/dL). Liver Function Tests: Recent Labs  Lab 05/20/20 1200  AST 43*  ALT 57*  ALKPHOS 66  BILITOT 0.3  PROT 5.5*  ALBUMIN 2.5*   No results for input(s): LIPASE, AMYLASE in the last 168 hours. No results for input(s): AMMONIA in the last 168 hours. Coagulation Profile: Recent Labs  Lab 05/20/20 1200  INR 1.1   Cardiac Enzymes: No results for input(s): CKTOTAL, CKMB, CKMBINDEX, TROPONINI in the last 168 hours. BNP (last 3 results) No results for input(s): PROBNP in the last 8760 hours. HbA1C: No results for input(s): HGBA1C in the last 72 hours. CBG: No results for input(s): GLUCAP in the last 168 hours. Lipid Profile: No results for input(s): CHOL, HDL, LDLCALC, TRIG, CHOLHDL, LDLDIRECT in the last 72 hours. Thyroid Function Tests: No results for input(s): TSH, T4TOTAL, FREET4, T3FREE, THYROIDAB in the last 72 hours. Anemia Panel: Recent Labs    05/20/20 1832  VITAMINB12 492  FOLATE 42.6  FERRITIN 110  TIBC 204*  IRON 13*  RETICCTPCT 1.1   Urine analysis:    Component Value Date/Time   COLORURINE STRAW (A) 03/13/2020 1128   APPEARANCEUR CLEAR 03/13/2020 1128   LABSPEC 1.006 03/13/2020 1128   PHURINE 6.0 03/13/2020 1128   GLUCOSEU NEGATIVE 03/13/2020 1128   HGBUR NEGATIVE 03/13/2020 1128   BILIRUBINUR NEGATIVE 03/13/2020 1128   KETONESUR NEGATIVE 03/13/2020 1128   PROTEINUR NEGATIVE 03/13/2020 1128   UROBILINOGEN 1.0 03/27/2012 1119   NITRITE NEGATIVE 03/13/2020 1128   LEUKOCYTESUR NEGATIVE 03/13/2020 1128   Sepsis Labs: @LABRCNTIP (procalcitonin:4,lacticidven:4) ) Recent Results (from the past 240 hour(s))    Culture, blood (routine x 2)     Status: None (Preliminary result)   Collection Time: 05/20/20 12:00 PM   Specimen: BLOOD RIGHT ARM  Result Value Ref Range Status   Specimen Description BLOOD RIGHT ARM  Final   Special Requests   Final    BOTTLES DRAWN AEROBIC AND ANAEROBIC Blood Culture adequate volume   Culture   Final    NO GROWTH < 24 HOURS Performed at [facility], [place] [address] [place], [place] [address]    Report Status PENDING  Incomplete  Respiratory Panel by RT PCR (Flu A&B, Covid) - Nasopharyngeal Swab     Status: None   Collection Time: 05/20/20 12:11 PM   Specimen: Nasopharyngeal Swab  Result Value Ref Range Status   SARS Coronavirus 2 by RT PCR NEGATIVE NEGATIVE Final    Comment: (NOTE) SARS-CoV-2 target nucleic acids are NOT DETECTED.  The SARS-CoV-2 RNA is generally detectable in upper respiratoy specimens during the acute phase of infection. The lowest concentration of SARS-CoV-2 viral copies this assay can detect is 131 copies/mL. A negative result does not preclude SARS-Cov-2 infection and should not be used as the sole basis for treatment or other patient management decisions. A negative result may occur with  improper specimen collection/handling, submission of specimen other than nasopharyngeal swab, presence of viral mutation(s) within the areas targeted by this assay, and inadequate number of viral copies (<131 copies/mL). A negative result must be combined with clinical observations, patient history, and epidemiological information. The expected result is Negative.  Fact Sheet for Patients:  PinkCheek.be  Fact Sheet for Healthcare Providers:  GravelBags.it  This test is no t yet  approved or cleared by the [place] and  has been authorized for detection and/or diagnosis of SARS-CoV-2 by FDA under an Emergency Use Authorization (EUA). This EUA will remain  in effect (meaning  this test can be used) for the duration of the COVID-19 declaration under Section 564(b)(1) of the Act, 21 U.S.C. section 360bbb-3(b)(1), unless the authorization is terminated or revoked sooner.     Influenza A by PCR NEGATIVE NEGATIVE Final   Influenza B by PCR NEGATIVE NEGATIVE Final    Comment: (NOTE) The Xpert Xpress SARS-CoV-2/FLU/RSV assay is intended as an aid in  the diagnosis of influenza from Nasopharyngeal swab specimens and  should not be used as a sole basis for treatment. Nasal washings and  aspirates are unacceptable for Xpert Xpress SARS-CoV-2/FLU/RSV  testing.  Fact Sheet for Patients: PinkCheek.be  Fact Sheet for Healthcare Providers: GravelBags.it  This test is not yet approved or cleared by the [place] FDA and  has been authorized for detection and/or diagnosis of SARS-CoV-2 by  FDA under an Emergency Use Authorization (EUA). This EUA will remain  in effect (meaning this test can be used) for the duration of the  Covid-19 declaration under Section 564(b)(1) of the Act, 21  U.S.C. section 360bbb-3(b)(1), unless the authorization is  terminated or revoked. Performed at [facility], [place] [address], [place], [place] [address]   Culture, blood (routine x 2)     Status: None (Preliminary result)   Collection Time: 05/20/20 12:17 PM   Specimen: BLOOD RIGHT HAND  Result Value Ref Range Status   Specimen Description BLOOD RIGHT HAND  Final   Special Requests   Final    BOTTLES DRAWN AEROBIC ONLY Blood Culture results may not be optimal due to an inadequate volume of blood received in culture bottles   Culture   Final    NO GROWTH < 24 HOURS Performed at [facility], [place] [address], [place], [place] [address]    Report Status PENDING  Incomplete         Radiology Studies: CT Head Wo Contrast  Result Date: 05/20/2020 CLINICAL DATA:  Syncope.  Shortness of breath for 3 days.  EXAM: CT HEAD WITHOUT CONTRAST TECHNIQUE: Contiguous axial images were obtained from the base of the skull through the vertex without intravenous contrast. COMPARISON:  April 08, 2020 FINDINGS: Brain: No subdural, epidural, or subarachnoid hemorrhage. Cerebellum, brainstem, and basal cisterns are normal ventricles and sulci are stable. Mild white matter changes are identified. No acute cortical ischemia or infarct. No mass effect or midline shift. Vascular: Calcified atherosclerosis in the intracranial carotids. Skull: Normal. Negative for fracture or focal lesion. Sinuses/Orbits: No acute finding. Other: None. IMPRESSION: 1. No acute intracranial abnormalities identified. Chronic white matter changes. Electronically Signed   By: Dorise Bullion III M.D   On: 05/20/2020 15:36   CT Angio Chest PE W and/or Wo Contrast  Result Date: 05/20/2020 CLINICAL DATA:  Syncope.  Shortness of breath. EXAM: CT ANGIOGRAPHY CHEST WITH CONTRAST TECHNIQUE: Multidetector CT imaging of the chest was performed using the standard protocol during bolus administration of intravenous contrast. Multiplanar CT image reconstructions and MIPs were obtained to evaluate the vascular anatomy. CONTRAST:  26mL OMNIPAQUE IOHEXOL 350 MG/ML SOLN COMPARISON:  04/08/2020 FINDINGS: Cardiovascular: Contrast injection is sufficient to demonstrate satisfactory opacification of the pulmonary arteries to the segmental level. There is no pulmonary embolus or evidence of right heart strain. The heart size is significantly enlarged there is an aberrant right  subclavian artery with a high-grade long segment stenosis and likely occlusion. This may have worsened since the prior study but is suboptimally evaluated secondary to contrast timing. The right vertebral artery is not well visualized on this study which appears to be a new finding since April 08, 2020 the main pulmonary artery is significantly dilated measuring up to approximately 3.5 cm in  diameter. There are atherosclerotic changes throughout the coronary arteries and thoracic aorta without evidence for an aneurysm. There is no significant pericardial effusion. Mediastinum/Nodes: --there is apparent interval development of an enlarged subcarinal lymph node measuring up to approximately 1.7 cm. --there is mild new right hilar adenopathy. -- No axillary lymphadenopathy. -- No supraclavicular lymphadenopathy. -- Normal thyroid gland where visualized. -  Unremarkable esophagus. Lungs/Pleura: There are moderate severe emphysematous changes bilaterally. There is interlobular septal thickening. Fiducial markers are noted in the right upper lobe. There is relatively stable nodular densities at the right lung apex. There is atelectasis at the lung bases, right worse than left. There are relatively stable areas of scarring atelectasis in the left upper lobe with adjacent fiducial markers. There is a small right-sided pleural effusion. There is a trace left-sided pleural effusion. There is no pneumothorax. There is a persistent, relatively unchanged 1 cm spiculated nodule in the right lower lobe, suboptimally evaluated on this study secondary to adjacent airspace disease (axial series 6, image 104). Upper Abdomen: Contrast bolus timing is not optimized for evaluation of the abdominal organs. The visualized portions of the organs of the upper abdomen are normal. There is a high-grade stenosis of the origin of the celiac axis. Musculoskeletal: There is an old compression fracture of the T3 vertebral body. No new compression fracture identified on this study. There are acute to subacute mildly displaced fractures involving the anterolateral third and fourth ribs on the left. There is nonspecific skin thickening involving the patient's left breast which appears to have progressed since the prior study. Review of the MIP images confirms the above findings. IMPRESSION: 1. No evidence for an acute pulmonary embolism  2. 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 This artery demonstrates a long segment high-grade stenosis proximally, which appears to have worsened since the prior study dated 04/08/2020. As such, no contrast is visualized in the right subclavian or right axillary arteries in addition to the right vertebral artery. These findings are not well evaluated secondary to contrast timing. Correlation with the patient's right upper extremity pulses is recommended. Additionally, a carotid artery ultrasound may be useful to detect flow within the right vertebral artery. This combination of findings may predispose the patient to a steal phenomenon. 3. Acute to subacute left-sided rib fractures as detailed above without evidence for pneumothorax. 4. Moderate severe emphysematous changes bilaterally with superimposed interstitial edema. There are small bilateral pleural effusions. 5. Cardiomegaly with findings of pulmonary artery hypertension. 6. Relatively stable nodular opacities in both lungs, somewhat limited in evaluation on today's study secondary to the presence of airspace disease and pleural effusions. 7. Apparent new subcarinal and mild right hilar adenopathy of unknown clinical significance. A repeat examination in 3 months with contrast should be performed to confirm stability or resolution. 8. Apparent new skin thickening overlying the left breast. Correlation with physical exam is recommended. Mammography is recommended if not recently performed. Aortic Atherosclerosis (ICD10-I70.0) and Emphysema (ICD10-J43.9). Electronically Signed   By: Constance Holster M.D.   On: 05/20/2020 15:47   DG Chest Port 1 View  Result Date: 05/20/2020 CLINICAL DATA:  74 year old  female with a history of shortness of breath EXAM: PORTABLE CHEST 1 VIEW COMPARISON:  04/08/2020, chest CT 04/08/2020 FINDINGS: Cardiomediastinal silhouette unchanged in size and contour. Interval development of interlobular septal  thickening. Blunting of the right costophrenic angle new from the comparison. Coarsened interstitial markings. Surgical clips project over the bilateral lung apices. Calcifications of the aortic arch again noted. Left sided rib fractures again demonstrated better seen on prior CT IMPRESSION: New CHF with small right pleural effusion. Electronically Signed   By: Corrie Mckusick D.O.   On: 05/20/2020 11:49      Scheduled Meds: . sodium chloride   Intravenous Once  . arformoterol  15 mcg Nebulization BID  . aspirin EC  81 mg Oral Daily  . enoxaparin (LOVENOX) injection  40 mg Subcutaneous Q24H  . levothyroxine  50 mcg Oral Daily  . multivitamin with minerals  1 tablet Oral Daily  . pantoprazole  40 mg Oral Daily  . revefenacin  175 mcg Nebulization Daily  . rosuvastatin  10 mg Oral Daily  . sodium chloride flush  3 mL Intravenous Q12H  . tamoxifen  20 mg Oral Daily   Continuous Infusions: . sodium chloride    . sodium chloride    . doxycycline (VIBRAMYCIN) IV       LOS: 1 day      Debbe Odea, MD [facility] Pager: [email or web address] 05/21/2020, 11:58 AM

## 2020-05-22 DIAGNOSIS — J810 Acute pulmonary edema: Secondary | ICD-10-CM | POA: Diagnosis not present

## 2020-05-22 LAB — BASIC METABOLIC PANEL
Anion gap: 12 (ref 5–15)
Anion gap: 10 (ref 5–15)
BUN: 9 mg/dL (ref 8–23)
BUN: 9 mg/dL (ref 8–23)
CO2: 37 mmol/L — ABNORMAL HIGH (ref 22–32)
CO2: 40 mmol/L — ABNORMAL HIGH (ref 22–32)
Calcium: 8.1 mg/dL — ABNORMAL LOW (ref 8.9–10.3)
Calcium: 8.6 mg/dL — ABNORMAL LOW (ref 8.9–10.3)
Chloride: 91 mmol/L — ABNORMAL LOW (ref 98–111)
Chloride: 91 mmol/L — ABNORMAL LOW (ref 98–111)
Creatinine, Ser: 0.74 mg/dL (ref 0.44–1.00)
Creatinine, Ser: 0.7 mg/dL (ref 0.44–1.00)
GFR, Estimated: >60 mL/min (ref >60)
GFR, Estimated: >60 mL/min (ref >60)
Glucose, Bld: 117 mg/dL — ABNORMAL HIGH (ref 70–99)
Glucose, Bld: 112 mg/dL — ABNORMAL HIGH (ref 70–99)
Potassium: 3.1 mmol/L — ABNORMAL LOW (ref 3.5–5.1)
Potassium: 3.5 mmol/L (ref 3.5–5.1)
Sodium: 140 mmol/L (ref 135–145)
Sodium: 141 mmol/L (ref 135–145)

## 2020-05-22 LAB — MAGNESIUM: Magnesium: 1.8 mg/dL (ref 1.7–2.4)

## 2020-05-22 LAB — HIV ANTIBODY (ROUTINE TESTING W REFLEX): HIV Screen 4th Generation wRfx: NONREACTIVE

## 2020-05-22 LAB — CBC
HCT: 25.4 % — ABNORMAL LOW (ref 36.0–46.0)
Hemoglobin: 8.1 g/dL — ABNORMAL LOW (ref 12.0–15.0)
MCH: 27.6 pg (ref 26.0–34.0)
MCHC: 31.9 g/dL (ref 30.0–36.0)
MCV: 86.4 fL (ref 80.0–100.0)
Platelets: 411 10*3/uL — ABNORMAL HIGH (ref 150–400)
RBC: 2.94 MIL/uL — ABNORMAL LOW (ref 3.87–5.11)
RDW: 16.6 % — ABNORMAL HIGH (ref 11.5–15.5)
WBC: 9.5 10*3/uL (ref 4.0–10.5)
nRBC: 0 % (ref 0.0–0.2)

## 2020-05-22 LAB — SARS CORONAVIRUS 2 BY RT PCR (HOSPITAL ORDER, PERFORMED IN ~~LOC~~ HOSPITAL LAB): SARS Coronavirus 2: NEGATIVE

## 2020-05-22 LAB — MRSA PCR SCREENING: MRSA by PCR: NEGATIVE

## 2020-05-22 MED ORDER — ENOXAPARIN SODIUM 30 MG/0.3ML ~~LOC~~ SOLN
30.0000 mg | SUBCUTANEOUS | Status: DC
  Administered 2020-05-22: 30 mg via SUBCUTANEOUS
  Filled 2020-05-22: qty 0.3

## 2020-05-22 MED ORDER — FUROSEMIDE 10 MG/ML IJ SOLN
40.0000 mg | Freq: Two times a day (BID) | INTRAMUSCULAR | Status: DC
  Administered 2020-05-22 – 2020-05-23 (×2): 40 mg via INTRAVENOUS
  Filled 2020-05-22 (×2): qty 4

## 2020-05-22 MED ORDER — POTASSIUM CHLORIDE CRYS ER 20 MEQ PO TBCR
40.0000 meq | EXTENDED_RELEASE_TABLET | ORAL | Status: AC
  Administered 2020-05-22 (×2): 40 meq via ORAL
  Filled 2020-05-22 (×2): qty 2

## 2020-05-22 NOTE — Progress Notes (Addendum)
   05/22/20 1103  Mobility  Activity Ambulated in hall  Range of Motion/Exercises Active  Level of Assistance Minimal assist, patient does 75% or more  Assistive Device Front wheel walker  Distance Ambulated (ft) 100 ft  Mobility Response Tolerated well  Mobility performed by Nurse   Pt ambulated in the hallway with 4L [place]. Nurse advised pt to slow down and use purse lip breathing technique, oxygen saturation up to 96%.  Pt denied pain or shortness of breath.   When back to room, oxygen saturation went down from 96% to 88-90% oxygen saturation on 4L [place] while lying down.   Purse lip breathing technique performed by pt, oxygen saturation up to 91%.   Pt denies shortness of breath.   Call bell within reach.

## 2020-05-22 NOTE — Consult Note (Signed)
   [facility] CM Inpatient Consult   05/22/2020  [place] [birth year] [id number]   .[employer] [ACO] Patient:  Medicare NextGen  From SNF: [facility] [as discussed in progression meeting by [facility] RNCM]   Patient screened for extreme high risk score for unplanned readmission score and for hospitalizations to check if potential [place] service needs.  Review of patient's medical record reveals patient is recommended to return to SNF.  Plan:  Following progress with inpatient TOC team.  Will alert [facility] RN [facility] nurse of transition if returning to a [facility] affiliated SNF.  Continue to follow progress and disposition to assess for post hospital care management needs.    For questions contact:   Natividad Brood, RN BSN [facility] Liaison  [phone] business mobile phone Toll free office [phone]  Fax number: [phone] [email or web address]

## 2020-05-22 NOTE — TOC Initial Note (Addendum)
Transition of Care [facility]) - Initial/Assessment Note    Patient Details  Name: Kendra Watts MRN: [id number] Date of Birth: 07/28/1945  Transition [facility] Care [facility]) CM/SW Contact:    Zenon Mayo, RN Phone Number: 05/22/2020, 2:48 PM  Clinical Narrative:                 NCM spoke with patient at bedside, plan is to return to [facility] SNF to finish short term rehab per patient.  NCM informed Yvone Neu , patient's nephew also of the plan , possibly tomorrow for discharge per MD. NCM spoke with Andris Baumann at [facility] and they have a bed for her tomorrow.  NCM asked MD to do Covid test today.  Ambulance forms on unit.  Expected Discharge Plan: Skilled Nursing Facility Barriers to Discharge: No Barriers Identified   Patient Goals and CMS Choice Patient states their goals for this hospitalization and ongoing recovery are:: short term therapy at SNF CMS Medicare.gov Compare Post Acute Care list provided to:: Patient Choice offered to / list presented to : Patient, Adult Children  Expected Discharge Plan and Services Expected Discharge Plan: [place]   Discharge Planning Services: CM Consult Post Acute Care Choice: NA Living arrangements for the past 2 months: [place]                   DME Agency: NA       HH Arranged: NA          Prior Living Arrangements/Services Living arrangements for the past 2 months: [place]   Patient language and need for interpreter reviewed:: Yes Do you feel safe going back to the place where you live?: Yes      Need for Family Participation in Patient Care: Yes (Comment) Care giver support system in place?: Yes (comment)   Criminal Activity/Legal Involvement Pertinent to Current Situation/Hospitalization: No - Comment as needed  Activities of Daily Living Home Assistive Devices/Equipment: Walker (specify type) ADL Screening (condition at time of admission) Patient's cognitive ability adequate  to safely complete daily activities?: Yes Is the patient deaf or have difficulty hearing?: No Does the patient have difficulty seeing, even when wearing glasses/contacts?: No Does the patient have difficulty concentrating, remembering, or making decisions?: No Patient able to express need for assistance with ADLs?: Yes Does the patient have difficulty dressing or bathing?: No Independently performs ADLs?: Yes (appropriate for developmental age) Does the patient have difficulty walking or climbing stairs?: Yes Weakness of Legs: Both Weakness of Arms/Hands: None  Permission Sought/Granted                  Emotional Assessment Appearance:: Appears stated age Attitude/Demeanor/Rapport: Engaged Affect (typically observed): Appropriate Orientation: : Oriented to Self, Oriented to Place, Oriented to  Time, Oriented to Situation Alcohol / Substance Use: Not Applicable Psych Involvement: No (comment)  Admission diagnosis:  Pulmonary edema [J81.1] SOB (shortness of breath) [R06.02] Patient Active Problem List   Diagnosis Date Noted  . Pulmonary edema 05/20/2020  . Multiple closed fractures of ribs of left side   . Fall   . Palliative care by specialist   . Goals of care, counseling/discussion   . Advanced directives, counseling/discussion   . Generalized weakness   . Fracture of one rib, left side, initial encounter for closed fracture 04/08/2020  . Fracture of greater trochanter of left femur ([place]) 04/08/2020  . Closed fracture of greater trochanter of left femur ([place]) 04/08/2020  . Epistaxis 03/13/2020  .  Acute blood loss anemia 03/13/2020  . Current smoker   . Hypertension, uncontrolled   . 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 Hemoptysis 09/14/2018  . Squamous cell lung cancer, left ([place]) 09/10/2018  . Adenocarcinoma, lung, right ([place]) 09/10/2018  . Multiple lung nodules 07/20/2018  . Chronic obstructive pulmonary disease ([place]) 07/20/2018  . Acute CHF (congestive  heart failure) ([place]) 07/06/2018  . Chronic respiratory failure with hypoxia ([place]) 07/06/2018  . Hyponatremia 07/06/2018  . Anemia 07/06/2018  . Breast cancer of upper-outer quadrant of left female breast ([place]) 09/08/2015  . Renal artery stenosis ([place]) 08/23/2014  . Right upper extremity numbness 07/01/2013  . Atherosclerotic renal artery stenosis, bilateral ([place]) 05/26/2013  . Peripheral arterial disease ([place]) 05/26/2013  . Essential hypertension 05/26/2013  . Hyperlipidemia 05/26/2013  . Tobacco abuse 05/26/2013  . Carotid artery stenosis 03/09/2012   PCP:  Merrilee Seashore, MD Pharmacy:   [id number] - [place], [address] [place] [address] Phone: [phone] Fax: [phone]     Social Determinants of Health (SDOH) Interventions    Readmission Risk Interventions Readmission Risk Prevention Plan 03/16/2020  Transportation Screening Complete  PCP or Specialist Appt within 3-5 Days Complete  HRI or [place] Complete  Social Work Consult for [place] Planning/Counseling Complete  Palliative Care Screening Not Applicable  Medication Review Press photographer) Complete  Some recent data might be hidden

## 2020-05-22 NOTE — Progress Notes (Signed)
NAME:  Kendra Watts, MRN:  [id number], DOB:  [birth year], LOS: 2 ADMISSION DATE:  05/20/2020, CONSULTATION DATE:  05/20/2020 REFERRING MD:  Dr. Langston Masker, ER, CHIEF COMPLAINT:  Short of breath   Brief History   74 yo female smoker fell at nursing home and had more shortness of breath for past 3 days.  In ER found to have hypoxia with SpO2 55% on 2 liters.  Also noted to have anemia and hypotension.  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  PCCM asked to assist with respiratory management.  History of present illness   She is followed by Dr. Lamonte Sakai in pulmonary office.  She has severe COPD on home oxygen.  She also has history of concurrent b/l primary lung cancer.  She was in NH and fell.  She hurt her side.  She has been feeling more short of breath since.  Has dry cough.  No headache, dizziness, fever, abdominal pain, diarrhea, melena, or leg swelling.  Found to have hypoxia and hypotension in ER.  Given fluid bolus and supplemental oxygen.  Then EDP concerned she had crackles on exam and lasix ordered.  She is being set up for PRBC transfusion.  Past Medical History  COPD with emphysema, Renal artery stenosis, Lung cancer s/p XRT, Hypothyroidism, HTN, HLD, Hyperparathyroidism, Diastolic CHF, Cataract, Breast cancer 2017  Significant Hospital Events   10/30 Admit  Consults:    Procedures:    Significant Diagnostic Tests:   CT angio chest 05/20/20 >> cardiomegaly, aberrant Rt  artery with stenosis, Rt vertebral artery not well visualized, PA 3.5 cm, coronary atherosclerosis, 1.7 cm subcarinal LN and mild Rt hilar LAN, moderate to severe centrilobular emphysema, fiducial markers in RUL with stable density, basilar ATX, fiducial markers in LUL, small Rt effusion, 1 cm spiculated nodule RLL, old T3 compression fx, acute to subacute Lt 3rd and 4th rib fx  Echo 10/31 >> EF 60 to 65%, mild MR  Micro Data:  COVID 10/30 >> negative  Antimicrobials:  Rocephin  10/31 >> Zithromax 10/31 >>   Interim history/subjective:  On 5L O2 via [place]. Feels breathing is better  Objective   Blood pressure 132/68, pulse 99, temperature 98.7 F (37.1 C), temperature source Oral, resp. rate 17, height 5' (1.524 m), weight 43.1 kg, SpO2 91 %.        Intake/Output Summary (Last 24 hours) at 05/22/2020 1749 Last data filed at 05/22/2020 1328 Gross per 24 hour  Intake 1445 ml  Output 1300 ml  Net 145 ml   Filed Weights   05/20/20 2022 05/21/20 0406 05/22/20 0028  Weight: 43.2 kg 43.9 kg 43.1 kg   Physical Exam: General: Well-appearing, no acute distress HENT: [place], AT, OP clear, MMM Eyes: EOMI, no scleral icterus Respiratory: Decreased breath sounds bilaterally.  No crackles, wheezing or rales Cardiovascular: RRR, -M/R/G, no JVD Extremities:-Edema,-tenderness Neuro: AAO x4, CNII-XII grossly intact Skin: Intact, no rashes or bruising Psych: Normal mood, normal affect  Resolved Hospital Problem list     Assessment & Plan:   Acute on chronic hypoxic, hypercapnic respiratory failure from interstitial edema and 3rd/4th Lt rib fractures in setting of severe COPD with emphysema. Oxygenation near baseline of 4L - goal SpO2 90 to 95% - continue yupelri, brovana, prn albuterol - ABx per primary team. Consider transitioning to PO if tolerated - bronchial hygiene  Acute on chronic diastolic CHF. - even to negative fluid balance  Iron deficiency nemia. - per primary team  Hypotension. - resolved  Mediastinal adenopathy with hx of breast cancer and lung cancer. - will need f/u imaging as an outpt - previously followed by [name] with oncology and Dr. Sondra Come with radiation oncology  Pulmonary will be available as needed. For any urgent questions or concerns, please call consult pager.  Labs    CMP Latest Ref Rng & Units 05/22/2020 05/22/2020 05/21/2020  Glucose 70 - 99 mg/dL 112(H) 117(H) 95  BUN 8 - 23 mg/dL 9 9 10   Creatinine 0.44 - 1.00 mg/dL  0.70 0.74 0.80  Sodium 135 - 145 mmol/L 141 140 141  Potassium 3.5 - 5.1 mmol/L 3.5 3.1(L) 3.3(L)  Chloride 98 - 111 mmol/L 91(L) 91(L) 94(L)  CO2 22 - 32 mmol/L 40(H) 37(H) 37(H)  Calcium 8.9 - 10.3 mg/dL 8.6(L) 8.1(L) 8.2(L)  Total Protein 6.5 - 8.1 g/dL - - -  Total Bilirubin 0.3 - 1.2 mg/dL - - -  Alkaline Phos 38 - 126 U/L - - -  AST 15 - 41 U/L - - -  ALT 0 - 44 U/L - - -    CBC Latest Ref Rng & Units 05/22/2020 05/21/2020 05/20/2020  WBC 4.0 - 10.5 K/uL 9.5 9.0 11.7(H)  Hemoglobin 12.0 - 15.0 g/dL 8.1(L) 7.0(L) 6.7(LL)  Hematocrit 36 - 46 % 25.4(L) 23.1(L) 22.4(L)  Platelets 150 - 400 K/uL 411(H) 433(H) 429(H)    ABG    Component Value Date/Time   PHART 7.331 (L) 05/20/2020 1141   PCO2ART 76.1 (HH) 05/20/2020 1141   PO2ART 145 (H) 05/20/2020 1141   HCO3 40.3 (H) 05/20/2020 1141   TCO2 43 (H) 05/20/2020 1141   O2SAT 99.0 05/20/2020 1141    Signature:  Care Time: 15 min  Reviewed prior documentation, coordinating care and discussing medical diagnosis and plan with the patient/family. Imaging, labs and tests included in this note have been reviewed and interpreted independently by me.  Rodman Pickle, M.D. [facility] Pulmonary/Critical Care Medicine 05/22/2020 5:49 PM

## 2020-05-22 NOTE — Progress Notes (Signed)
PROGRESS NOTE    MKENZIE DOTTS   KYH:[id number]  DOB: 12-13-1945  DOA: 05/20/2020 PCP: Merrilee Seashore, MD   Brief Narrative:  Kendra Watts is a 74 y.o. female with medical history of a recent left femur fracture after a fall (discharged to SNF ron 9/22), HTN, COPD, Chronic respiratory failure on 4 L O2 at home, lung nodules with nodules positive in the past for sq cell lung CA , Breast cancer, acute blood loss anemia due to epistaxis in September who presents from SNF for dyspnea. Pulse ox noted by EMS was 55 % on 2 L.EMS placed her on a non rebreather and gave her an albuterol inhaler. Her oxygen level improved and oxygen was weaned down to 6 L. Pulse ox was 94%. While in the ED she was noted to have a BP of 70/50 and given 2 L IVF. She was also noted to have a Hb of 6.8 and was ordered blood (not yet transfused).  While in the ED she became progressively hypoxic and short of breath and needed 6 L O2 and then a non rebreather mask again.  The patient is unable to explain to me if at the facility she was short of breath at rest or with exertion. She has a mild cough which is not new. She has otherwise been doing well and recuperating from her fracture.   ED Course: See above.  - CTA (done before she became hypoxic the second time > mainly chronic changes in lungs and small bilateral pleural effusions. Acute to subacute left-sided rib fractures as detailed above without evidence for pneumothorax.  - ABG- pH 7.33, PCO2 76.1, pO2 145, pulse ox 99 - Given 40 mg IV Lasix after receiving 2 L IVF  Subjective: Seen this AM. She told me she was short of breath at rest. Still has a cough.     Assessment & Plan:   Active Problems:    Pulmonary edema- acute diastolic CHF  Acute on chronic hypoxic respiratory failure - COPD- ? Exacerbation - Pneumonia - initially short of breath possibly due to a COPD exacerbation, per ED physician and her nurse it resolved after the albuterol  inhaler but she was still requiring 6 L o2 - second episode in ED likely due to fluid overload from 2 L IVF- pulse ox is in mid 80s on 4 L -  ECHO>  EF 6065%, indeterminate LV diastolic parameters - cont nebs/ inhalers  - cough with yellow green sputum and blood- dyspnea on exertion with hypoxia, crackles at both bases- CT showing some consolidation/ atelectasis R > L - added Ceftriaxone and Azithromycin for pneumonia- cont to follow for improvement in dyspnea- O2 dropping to 86% on exertion - will continue Lasix today while watching BP  Hypotension  - initial BP 70/50  - improved with 2 L IVF- ? If she was initially over diursed (on Lasix as outpt) in setting of using Metoprolol and Doxazosin along with anemia - holding antihypertensives- SBP still in 90s   Anemia, normocytic - Hemoccult neg - she states she has not had any recent nose bleeds or blood in stools -  anemia panel> low Iron and Iron saturation, Ferritin may be falsely elevated in setting of infection - she has been typed and crossed and per RN, blood is on hold - 11/1> Hb 7 today- will transfuse 1 U PRBC (cont Lasix)  - 11/2 Hb increased to 8.1  Breast Cancer - cont Tamoxifen  Lung nodules with h/o lung  CA   - outpt follow up with contrast CT in 3 months recommended  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  Rib fractures - left sided- likely from last fall when she was admitted for a femur fracture- no acute pain  Apparent new skin thickening overlying the left breast - left breast is slightly erythematous - patient has not noted any acute changes- follow   Time spent in minutes: 35 DVT prophylaxis: enoxaparin (LOVENOX) injection 40 mg Start: 05/20/20 1900 SCDs Start: 05/20/20 1842 Code Status: Full code but does not want to be left on vent if she has no hope of coming off of it Family Communication:  Disposition Plan:  Status is: Inpatient  Remains inpatient  appropriate because:IV treatments appropriate due to intensity of illness or inability to take PO   Dispo: The patient is from: SNF              Anticipated d/c is to: SNF              Anticipated d/c date is: 3 days              Patient currently is not medically stable to d/c.      Consultants:   none Procedures:   2 D ECHO Antimicrobials:  Anti-infectives (From admission, onward)   Start     Dose/Rate Route Frequency Ordered Stop   05/21/20 1300  cefTRIAXone (ROCEPHIN) 2 g in sodium chloride 0.9 % 100 mL IVPB        2 g 200 mL/hr over 30 Minutes Intravenous Every 24 hours 05/21/20 1208 05/26/20 1259   05/21/20 1300  azithromycin (ZITHROMAX) tablet 500 mg        500 mg Oral Daily 05/21/20 1208 05/26/20 0959   05/21/20 1000  doxycycline (VIBRAMYCIN) 100 mg in sodium chloride 0.9 % 250 mL IVPB  Status:  Discontinued        100 mg 125 mL/hr over 120 Minutes Intravenous Every 12 hours 05/21/20 0927 05/21/20 1208       Objective: Vitals:   05/21/20 2013 05/21/20 2129 05/22/20 0028 05/22/20 0339  BP:  137/77 115/65 97/64  Pulse:  94 87 80  Resp:  17 15 15   Temp:  98.3 F (36.8 C) 98.3 F (36.8 C) 99.1 F (37.3 C)  TempSrc:  Oral Oral Oral  SpO2: 97% 100% 97% 97%  Weight:   43.1 kg   Height:        Intake/Output Summary (Last 24 hours) at 05/22/2020 0751 Last data filed at 05/22/2020 0504 Gross per 24 hour  Intake 1781 ml  Output 1100 ml  Net 681 ml   Filed Weights   05/20/20 2022 05/21/20 0406 05/22/20 0028  Weight: 43.2 kg 43.9 kg 43.1 kg    Examination: General exam: Appears comfortable  HEENT: PERRLA, oral mucosa moist, no sclera icterus or thrush Respiratory system: respiratory rate 24 today- very poor air movement Cardiovascular system: S1 & S2 heard,  No murmurs  Gastrointestinal system: Abdomen soft, non-tender, nondistended. Normal bowel sounds   Central nervous system: Alert and oriented. No focal neurological deficits. Extremities: No cyanosis,  clubbing or edema Skin: No rashes or ulcers Psychiatry:  Mood & affect appropriate.    Data Reviewed: I have personally reviewed following labs and imaging studies  CBC: Recent Labs  Lab 05/20/20 1141 05/20/20 1200 05/20/20 1832 05/21/20 0843 05/22/20 0427  WBC  --  11.9* 11.7* 9.0 9.5  NEUTROABS  --  10.1*  --   --   --   HGB 6.8* 6.4* 6.7* 7.0* 8.1*  HCT 20.0* 21.8* 22.4* 23.1* 25.4*  MCV  --  91.2 90.0 88.5 86.4  PLT  --  382 429* 433* 811*   Basic Metabolic Panel: Recent Labs  Lab 05/20/20 1141 05/20/20 1200 05/20/20 1832 05/21/20 0843 05/22/20 0427  NA 139 140  --  141 140  K 3.5 3.7  --  3.3* 3.1*  CL  --  96*  --  94* 91*  CO2  --  35*  --  37* 37*  GLUCOSE  --  140*  --  95 117*  BUN  --  11  --  10 9  CREATININE  --  0.90 0.74 0.80 0.74  CALCIUM  --  8.4*  --  8.2* 8.1*   GFR: Estimated Creatinine Clearance: 42 mL/min (by C-G formula based on SCr of 0.74 mg/dL). Liver Function Tests: Recent Labs  Lab 05/20/20 1200  AST 43*  ALT 57*  ALKPHOS 66  BILITOT 0.3  PROT 5.5*  ALBUMIN 2.5*   No results for input(s): LIPASE, AMYLASE in the last 168 hours. No results for input(s): AMMONIA in the last 168 hours. Coagulation Profile: Recent Labs  Lab 05/20/20 1200  INR 1.1   Cardiac Enzymes: No results for input(s): CKTOTAL, CKMB, CKMBINDEX, TROPONINI in the last 168 hours. BNP (last 3 results) No results for input(s): PROBNP in the last 8760 hours. HbA1C: No results for input(s): HGBA1C in the last 72 hours. CBG: No results for input(s): GLUCAP in the last 168 hours. Lipid Profile: No results for input(s): CHOL, HDL, LDLCALC, TRIG, CHOLHDL, LDLDIRECT in the last 72 hours. Thyroid Function Tests: No results for input(s): TSH, T4TOTAL, FREET4, T3FREE, THYROIDAB in the last 72 hours. Anemia Panel: Recent Labs    05/20/20 1832  VITAMINB12 492  FOLATE 42.6  FERRITIN 110  TIBC 204*  IRON 13*  RETICCTPCT 1.1   Urine analysis:    Component  Value Date/Time   COLORURINE STRAW (A) 03/13/2020 1128   APPEARANCEUR CLEAR 03/13/2020 1128   LABSPEC 1.006 03/13/2020 1128   PHURINE 6.0 03/13/2020 1128   GLUCOSEU NEGATIVE 03/13/2020 1128   HGBUR NEGATIVE 03/13/2020 1128   BILIRUBINUR NEGATIVE 03/13/2020 1128   KETONESUR NEGATIVE 03/13/2020 1128   PROTEINUR NEGATIVE 03/13/2020 1128   UROBILINOGEN 1.0 03/27/2012 1119   NITRITE NEGATIVE 03/13/2020 1128   LEUKOCYTESUR NEGATIVE 03/13/2020 1128   Sepsis Labs: @LABRCNTIP (procalcitonin:4,lacticidven:4) ) Recent Results (from the past 240 hour(s))  Culture, blood (routine x 2)     Status: None (Preliminary result)   Collection Time: 05/20/20 12:00 PM   Specimen: BLOOD RIGHT ARM  Result Value Ref Range Status   Specimen Description BLOOD RIGHT ARM  Final   Special Requests   Final    BOTTLES DRAWN AEROBIC AND ANAEROBIC Blood Culture adequate volume   Culture   Final    NO GROWTH < 24 HOURS Performed at [facility], [place] [address], [place],  [address]    Report Status PENDING  Incomplete  Respiratory Panel by RT PCR (Flu A&B, Covid) - Nasopharyngeal Swab     Status: None   Collection Time: 05/20/20 12:11 PM   Specimen: Nasopharyngeal Swab  Result Value Ref Range Status   SARS Coronavirus 2 by RT PCR NEGATIVE NEGATIVE Final    Comment: (NOTE) SARS-CoV-2 target nucleic acids are NOT DETECTED.  The SARS-CoV-2 RNA is generally detectable in upper respiratoy specimens during the  acute phase of infection. The lowest concentration of SARS-CoV-2 viral copies this assay can detect is 131 copies/mL. A negative result does not preclude SARS-Cov-2 infection and should not be used as the sole basis for treatment or other patient management decisions. A negative result may occur with  improper specimen collection/handling, submission of specimen other than nasopharyngeal swab, presence of viral mutation(s) within the areas targeted by this assay, and inadequate number of viral  copies (<131 copies/mL). A negative result must be combined with clinical observations, patient history, and epidemiological information. The expected result is Negative.  Fact Sheet for Patients:  PinkCheek.be  Fact Sheet for Healthcare Providers:  GravelBags.it  This test is no t yet approved or cleared by the [place] FDA and  has been authorized for detection and/or diagnosis of SARS-CoV-2 by FDA under an Emergency Use Authorization (EUA). This EUA will remain  in effect (meaning this test can be used) for the duration of the COVID-19 declaration under Section 564(b)(1) of the Act, 21 U.S.C. section 360bbb-3(b)(1), unless the authorization is terminated or revoked sooner.     Influenza A by PCR NEGATIVE NEGATIVE Final   Influenza B by PCR NEGATIVE NEGATIVE Final    Comment: (NOTE) The Xpert Xpress SARS-CoV-2/FLU/RSV assay is intended as an aid in  the diagnosis of influenza from Nasopharyngeal swab specimens and  should not be used as a sole basis for treatment. Nasal washings and  aspirates are unacceptable for Xpert Xpress SARS-CoV-2/FLU/RSV  testing.  Fact Sheet for Patients: PinkCheek.be  Fact Sheet for Healthcare Providers: GravelBags.it  This test is not yet approved or cleared by the [place] FDA and  has been authorized for detection and/or diagnosis of SARS-CoV-2 by  FDA under an Emergency Use Authorization (EUA). This EUA will remain  in effect (meaning this test can be used) for the duration of the  Covid-19 declaration under Section 564(b)(1) of the Act, 21  U.S.C. section 360bbb-3(b)(1), unless the authorization is  terminated or revoked. Performed at [facility], [place] [address] [place], [place] [address]   Culture, blood (routine x 2)     Status: None (Preliminary result)   Collection Time: 05/20/20 12:17 PM   Specimen:  BLOOD RIGHT HAND  Result Value Ref Range Status   Specimen Description BLOOD RIGHT HAND  Final   Special Requests   Final    BOTTLES DRAWN AEROBIC ONLY Blood Culture results may not be optimal due to an inadequate volume of blood received in culture bottles   Culture   Final    NO GROWTH < 24 HOURS Performed at [facility], [place] [address] [place], [place] [address]    Report Status PENDING  Incomplete  MRSA PCR Screening     Status: None   Collection Time: 05/22/20  4:39 AM   Specimen: Nasal Mucosa; Nasopharyngeal  Result Value Ref Range Status   MRSA by PCR NEGATIVE NEGATIVE Final    Comment:        The GeneXpert MRSA Assay (FDA approved for NASAL specimens only), is one component of a comprehensive MRSA colonization surveillance program. It is not intended to diagnose MRSA infection nor to guide or monitor treatment for MRSA infections. Performed at [facility], [place] [address], [place], [place] [address]          Radiology Studies: CT Head Wo Contrast  Result Date: 05/20/2020 CLINICAL DATA:  Syncope.  Shortness of breath for 3 days. EXAM: CT HEAD WITHOUT CONTRAST  TECHNIQUE: Contiguous axial images were obtained from the base of the skull through the vertex without intravenous contrast. COMPARISON:  April 08, 2020 FINDINGS: Brain: No subdural, epidural, or subarachnoid hemorrhage. Cerebellum, brainstem, and basal cisterns are normal ventricles and sulci are stable. Mild white matter changes are identified. No acute cortical ischemia or infarct. No mass effect or midline shift. Vascular: Calcified atherosclerosis in the intracranial carotids. Skull: Normal. Negative for fracture or focal lesion. Sinuses/Orbits: No acute finding. Other: None. IMPRESSION: 1. No acute intracranial abnormalities identified. Chronic white matter changes. Electronically Signed   By: Dorise Bullion III M.D   On: 05/20/2020 15:36   CT Angio Chest PE W and/or Wo Contrast  Result  Date: 05/20/2020 CLINICAL DATA:  Syncope.  Shortness of breath. EXAM: CT ANGIOGRAPHY CHEST WITH CONTRAST TECHNIQUE: Multidetector CT imaging of the chest was performed using the standard protocol during bolus administration of intravenous contrast. Multiplanar CT image reconstructions and MIPs were obtained to evaluate the vascular anatomy. CONTRAST:  3mL OMNIPAQUE IOHEXOL 350 MG/ML SOLN COMPARISON:  04/08/2020 FINDINGS: Cardiovascular: Contrast injection is sufficient to demonstrate satisfactory opacification of the pulmonary arteries to the segmental level. There is no pulmonary embolus or evidence of right heart strain. 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 This may have worsened since the prior study but is suboptimally evaluated secondary to contrast timing. The right vertebral artery is not well visualized on this study which appears to be a new finding since April 08, 2020 the main pulmonary artery is significantly dilated measuring up to approximately 3.5 cm in diameter. There are atherosclerotic changes throughout the coronary arteries and thoracic aorta without evidence for an aneurysm. There is no significant pericardial effusion. Mediastinum/Nodes: --there is apparent interval development of an enlarged subcarinal lymph node measuring up to approximately 1.7 cm. --there is mild new right hilar adenopathy. -- No axillary lymphadenopathy. -- No supraclavicular lymphadenopathy. -- Normal thyroid gland where visualized. -  Unremarkable esophagus. Lungs/Pleura: There are moderate severe emphysematous changes bilaterally. There is interlobular septal thickening. Fiducial markers are noted in the right upper lobe. There is relatively stable nodular densities at the right lung apex. There is atelectasis at the lung bases, right worse than left. There are relatively stable areas of scarring  atelectasis in the left upper lobe with adjacent fiducial markers. There is a small right-sided pleural effusion. There is a trace left-sided pleural effusion. There is no pneumothorax. There is a persistent, relatively unchanged 1 cm spiculated nodule in the right lower lobe, suboptimally evaluated on this study secondary to adjacent airspace disease (axial series 6, image 104). Upper Abdomen: Contrast bolus timing is not optimized for evaluation of the abdominal organs. The visualized portions of the organs of the upper abdomen are normal. There is a high-grade stenosis of the origin of the celiac axis. Musculoskeletal: There is an old compression fracture of the T3 vertebral body. No new compression fracture identified on this study. There are acute to subacute mildly displaced fractures involving the anterolateral third and fourth ribs on the left. There is nonspecific skin thickening involving the patient's left breast which appears to have progressed since the prior study. Review of the MIP images confirms the above findings. IMPRESSION: 1. No evidence for an acute pulmonary embolism 2. 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 This artery demonstrates a long segment high-grade stenosis proximally, which appears to have worsened since the prior study dated  04/08/2020. As such, no contrast is visualized in the right subclavian or right axillary arteries in addition to the right vertebral artery. These findings are not well evaluated secondary to contrast timing. Correlation with the patient's right upper extremity pulses is recommended. Additionally, a carotid artery ultrasound may be useful to detect flow within the right vertebral artery. This combination of findings may predispose the patient to a steal phenomenon. 3. Acute to subacute left-sided rib fractures as detailed above without evidence for pneumothorax. 4. Moderate severe emphysematous changes bilaterally with superimposed  interstitial edema. There are small bilateral pleural effusions. 5. Cardiomegaly with findings of pulmonary artery hypertension. 6. Relatively stable nodular opacities in both lungs, somewhat limited in evaluation on today's study secondary to the presence of airspace disease and pleural effusions. 7. Apparent new subcarinal and mild right hilar adenopathy of unknown clinical significance. A repeat examination in 3 months with contrast should be performed to confirm stability or resolution. 8. Apparent new skin thickening overlying the left breast. Correlation with physical exam is recommended. Mammography is recommended if not recently performed. Aortic Atherosclerosis (ICD10-I70.0) and Emphysema (ICD10-J43.9). Electronically Signed   By: Constance Holster M.D.   On: 05/20/2020 15:47   DG Chest Port 1 View  Result Date: 05/20/2020 CLINICAL DATA:  74 year old female with a history of shortness of breath EXAM: PORTABLE CHEST 1 VIEW COMPARISON:  04/08/2020, chest CT 04/08/2020 FINDINGS: Cardiomediastinal silhouette unchanged in size and contour. Interval development of interlobular septal thickening. Blunting of the right costophrenic angle new from the comparison. Coarsened interstitial markings. Surgical clips project over the bilateral lung apices. Calcifications of the aortic arch again noted. Left sided rib fractures again demonstrated better seen on prior CT IMPRESSION: New CHF with small right pleural effusion. Electronically Signed   By: Corrie Mckusick D.O.   On: 05/20/2020 11:49   ECHOCARDIOGRAM COMPLETE  Result Date: 05/21/2020    ECHOCARDIOGRAM REPORT   Patient Name:   AAYRA HORNBAKER [facility] Date of Exam: 05/21/2020 Medical Rec #:  [id number]       Height:       60.0 in Accession #:    [id number]      Weight:       96.8 lb Date of Birth:  08/29/1945       BSA:          1.371 m Patient Age:    69 years        BP:           95/82 mmHg Patient Gender: F               HR:           100 bpm. Exam Location:   Inpatient Procedure: 2D Echo, Color Doppler and Limited Color Doppler Indications:    Cardiomegaly 429.3/ I51.7  History:        Patient has prior history of Echocardiogram examinations, most                 recent 07/06/2018. S/P breast cancer and radiation.  Sonographer:    Merrie Roof RDCS Referring Phys: Page  1. Left ventricular ejection fraction, by estimation, is 60 to 65%. The left ventricle has normal function. The left ventricle has no regional wall motion abnormalities. Left ventricular diastolic parameters are indeterminate. Elevated left ventricular end-diastolic pressure.  2. Right ventricular systolic function is normal. The right ventricular size is normal. Tricuspid regurgitation signal is inadequate for assessing PA pressure.  3. The mitral  valve is degenerative. Mild mitral valve regurgitation. No evidence of mitral stenosis.  4. The aortic valve is tricuspid. Aortic valve regurgitation is not visualized. Mild aortic valve sclerosis is present, with no evidence of aortic valve stenosis.  5. The inferior vena cava is normal in size with greater than 50% respiratory variability, suggesting right atrial pressure of 3 mmHg. FINDINGS  Left Ventricle: Left ventricular ejection fraction, by estimation, is 60 to 65%. The left ventricle has normal function. The left ventricle has no regional wall motion abnormalities. The left ventricular internal cavity size was normal in size. There is  no left ventricular hypertrophy. Left ventricular diastolic parameters are indeterminate. Elevated left ventricular end-diastolic pressure. Right Ventricle: The right ventricular size is normal. No increase in right ventricular wall thickness. Right ventricular systolic function is normal. Tricuspid regurgitation signal is inadequate for assessing PA pressure. Left Atrium: Left atrial size was normal in size. Right Atrium: Right atrial size was normal in size. Pericardium: There is no evidence of  pericardial effusion. Mitral Valve: The mitral valve is degenerative in appearance. There is mild thickening of the mitral valve leaflet(s). There is mild calcification of the mitral valve leaflet(s). Mild mitral valve regurgitation. No evidence of mitral valve stenosis. Tricuspid Valve: The tricuspid valve is normal in structure. Tricuspid valve regurgitation is trivial. No evidence of tricuspid stenosis. Aortic Valve: The aortic valve is tricuspid. Aortic valve regurgitation is not visualized. Mild aortic valve sclerosis is present, with no evidence of aortic valve stenosis. Pulmonic Valve: The pulmonic valve was normal in structure. Pulmonic valve regurgitation is not visualized. No evidence of pulmonic stenosis. Aorta: The aortic root is normal in size and structure. Venous: The inferior vena cava is normal in size with greater than 50% respiratory variability, suggesting right atrial pressure of 3 mmHg. IAS/Shunts: No atrial level shunt detected by color flow Doppler.  LEFT VENTRICLE PLAX 2D LVIDd:         4.00 cm     Diastology LVIDs:         2.70 cm     LV e' medial:    6.85 cm/s LV PW:         0.80 cm     LV E/e' medial:  19.1 LV IVS:        0.80 cm     LV e' lateral:   7.83 cm/s LVOT diam:     1.90 cm     LV E/e' lateral: 16.7 LV SV:         66 LV SV Index:   48 LVOT Area:     2.84 cm  LV Volumes (MOD) LV vol d, MOD A4C: 33.8 ml LV vol s, MOD A4C: 10.9 ml LV SV MOD A4C:     33.8 ml RIGHT VENTRICLE          IVC RV Basal diam:  3.30 cm  IVC diam: 1.90 cm LEFT ATRIUM             Index       RIGHT ATRIUM           Index LA diam:        3.20 cm 2.33 cm/m  RA Area:     11.20 cm LA Vol (A2C):   61.2 ml 44.59 ml/m RA Volume:   27.90 ml  20.35 ml/m LA Vol (A4C):   48.2 ml 35.15 ml/m LA Biplane Vol: 46.6 ml 33.98 ml/m  AORTIC VALVE LVOT Vmax:   134.00 cm/s LVOT Vmean:  82.300 cm/s LVOT VTI:    0.232 m  AORTA Ao Root diam: 2.80 cm Ao Asc diam:  2.80 cm MITRAL VALVE MV Area (PHT): 5.42 cm     SHUNTS MV Decel  Time: 140 msec     Systemic VTI:  0.23 m MV E velocity: 131.00 cm/s  Systemic Diam: 1.90 cm MV A velocity: 122.00 cm/s MV E/A ratio:  1.07 Fransico Him MD Electronically signed by Fransico Him MD Signature Date/Time: 05/21/2020/1:19:16 PM    Final       Scheduled Meds: . sodium chloride   Intravenous Once  . arformoterol  15 mcg Nebulization BID  . aspirin EC  81 mg Oral Daily  . azithromycin  500 mg Oral Daily  . enoxaparin (LOVENOX) injection  40 mg Subcutaneous Q24H  . furosemide  40 mg Intravenous Once  . levothyroxine  50 mcg Oral Daily  . multivitamin with minerals  1 tablet Oral Daily  . pantoprazole  40 mg Oral Daily  . revefenacin  175 mcg Nebulization Daily  . rosuvastatin  10 mg Oral Daily  . sodium chloride flush  3 mL Intravenous Q12H  . tamoxifen  20 mg Oral Daily   Continuous Infusions: . sodium chloride    . sodium chloride    . cefTRIAXone (ROCEPHIN)  IV 2 g (05/21/20 1612)     LOS: 2 days      Debbe Odea, MD [facility] Pager: [email or web address] 05/22/2020, 7:51 AM

## 2020-05-22 NOTE — Evaluation (Signed)
Physical Therapy Evaluation Patient Details Name: Kendra Watts MRN: [id number] DOB: February 06, 1946 Today's Date: 05/22/2020   History of Present Illness  Pt is a 74 y.o. female who was at SNF for rehab following femur fracture, pt presented with dyspnea and O2 noted to be 55% on 2 L by EMS, EMS placed her on a non rebreather and gave her an albuterol inhaler. Her oxygen level improved and oxygen was weaned down to 6 L. Pulse ox was 94%. While in the ED she was noted to have a BP of 70/50 and given 2 L IVF. She was also noted to have a Hb of 6.8 and was ordered blood.  While in the ED she became progressively hypoxic and short of breath and needed 6 L O2 and then a non rebreather mask. Pt with medical history of a recent left femur fracture after a fall (discharged to SNF ron 9/22), HTN, COPD, Chronic respiratory failure on 4 L O2 at home, lung nodules with nodules positive in the past for sq cell lung CA , Breast cancer, acute blood loss anemia due to epistaxis in September   Clinical Impression  Pt fully participated in session, O2 noted to drop with activity on 4 L and pt requiring verbal cueing to improve breathing technique; pt requiring S-min guard with OOB activity due to line management and safety with AD; pt demonstrating deficits in gait, balance, endurance and safety and will benefit from additional skilled PT to address deficits prior to discharge home.     Follow Up Recommendations SNF    Equipment Recommendations  Other (comment) (TBD when pt returns home from SNF)    Recommendations for Other Services       Precautions / Restrictions Precautions Precautions: Fall Precaution Comments: monitor O2 Restrictions Weight Bearing Restrictions: No      Mobility  Bed Mobility Overal bed mobility: Modified Independent                  Transfers Overall transfer level: Needs assistance Equipment used: Rolling walker (2 wheeled) Transfers: Sit to/from Stand Sit to Stand:  Supervision         General transfer comment: line management and safety  Ambulation/Gait Ambulation/Gait assistance: Supervision;Min guard Gait Distance (Feet): 120 Feet Assistive device: Rolling walker (2 wheeled)       General Gait Details: decreased proximity to RW, verbal cueing needed to improve; min guard needed on turns due to poor alignment/posture in [employer]            Wheelchair Mobility    Modified Rankin (Stroke Patients Only)       Balance Overall balance assessment: Mild deficits observed, not formally tested                                           Pertinent Vitals/Pain Pain Assessment: No/denies pain    Home Living Family/patient expects to be discharged to:: Skilled nursing facility                      Prior Function Level of Independence: Independent with assistive device(s)               Hand Dominance   Dominant Hand: Right    Extremity/Trunk Assessment   Upper Extremity Assessment Upper Extremity Assessment: Overall WFL for tasks assessed    Lower Extremity Assessment Lower Extremity  Assessment: Overall WFL for tasks assessed    Cervical / Trunk Assessment Cervical / Trunk Assessment: Kyphotic  Communication   Communication: HOH  Cognition Arousal/Alertness: Awake/alert Behavior During Therapy: WFL for tasks assessed/performed Overall Cognitive Status: Within Functional Limits for tasks assessed                                        General Comments General comments (skin integrity, edema, etc.): o2 dropped to 86% during ambulation on 4 L, increased to 6 L during ambulation and 5 when resting, O2 increased to 91%    Exercises     Assessment/Plan    PT Assessment Patient needs continued PT services  PT Problem List Decreased mobility;Decreased activity tolerance;Decreased balance;Decreased knowledge of use of DME       PT Treatment Interventions Gait  training;Balance training;Therapeutic exercise;DME instruction;Patient/family education;Therapeutic activities    PT Goals (Current goals can be found in the Care Plan section)  Acute Rehab PT Goals Patient Stated Goal: I want to see my dog PT Goal Formulation: With patient Time For Goal Achievement: 06/05/20 Potential to Achieve Goals: Good    Frequency Min 2X/week   Barriers to discharge        Co-evaluation               AM-PAC PT "6 Clicks" Mobility  Outcome Measure Help needed turning from your back to your side while in a flat bed without using bedrails?: None Help needed moving from lying on your back to sitting on the side of a flat bed without using bedrails?: None Help needed moving to and from a bed to a chair (including a wheelchair)?: None Help needed standing up from a chair using your arms (e.g., wheelchair or bedside chair)?: None Help needed to walk in hospital room?: A Little Help needed climbing 3-5 steps with a railing? : A Little 6 Click Score: 22    End of Session Equipment Utilized During Treatment: Gait belt;Oxygen Activity Tolerance: Patient tolerated treatment well Patient left: in bed;with call bell/phone within reach;with bed alarm set Nurse Communication: Mobility status PT Visit Diagnosis: Unsteadiness on feet (R26.81)    Time: 3536-1443 PT Time Calculation (min) (ACUTE ONLY): 24 min   Charges:   PT Evaluation $PT Eval Low Complexity: 1 Low PT Treatments $Gait Training: 8-22 mins        Lyanne Co, DPT Acute Rehabilitation Services [phone]  [place] 05/22/2020, 12:48 PM

## 2020-05-22 NOTE — NC FL2 (Signed)
[place] LEVEL OF CARE SCREENING TOOL     IDENTIFICATION  Patient Name: Kendra Watts Birthdate: July 22, 1946 Sex: female Admission Date (Current Location): 05/20/2020  [facility] and [place] Number:  Herbalist and Address:  [facility] [place], [place] [address], [place], [place] [address]      Provider Number: [id number]  Attending Physician Name and Address:  Debbe Odea, MD  Relative Name and Phone Number:  [name] (nephew)  [phone]    Current Level of Care: Hospital Recommended Level of Care: [place] Prior Approval Number:    Date Approved/Denied:   PASRR Number: [id number]  Discharge Plan: SNF    Current Diagnoses: Patient Active Problem List   Diagnosis Date Noted  . Pulmonary edema 05/20/2020  . Multiple closed fractures of ribs of left side   . Fall   . Palliative care by specialist   . Goals of care, counseling/discussion   . Advanced directives, counseling/discussion   . Generalized weakness   . Fracture of one rib, left side, initial encounter for closed fracture 04/08/2020  . Fracture of greater trochanter of left femur ([place]) 04/08/2020  . Closed fracture of greater trochanter of left femur ([place]) 04/08/2020  . Epistaxis 03/13/2020  . Acute blood loss anemia 03/13/2020  . Current smoker   . Hypertension, uncontrolled   . 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 Hemoptysis 09/14/2018  . Squamous cell lung cancer, left ([place]) 09/10/2018  . Adenocarcinoma, lung, right ([place]) 09/10/2018  . Multiple lung nodules 07/20/2018  . Chronic obstructive pulmonary disease ([place]) 07/20/2018  . Acute CHF (congestive heart failure) ([place]) 07/06/2018  . Chronic respiratory failure with hypoxia ([place]) 07/06/2018  . Hyponatremia 07/06/2018  . Anemia 07/06/2018  . Breast cancer of upper-outer quadrant of left female breast ([place]) 09/08/2015  . Renal artery stenosis ([place]) 08/23/2014  . Right upper  extremity numbness 07/01/2013  . Atherosclerotic renal artery stenosis, bilateral ([place]) 05/26/2013  . Peripheral arterial disease ([place]) 05/26/2013  . Essential hypertension 05/26/2013  . Hyperlipidemia 05/26/2013  . Tobacco abuse 05/26/2013  . Carotid artery stenosis 03/09/2012    Orientation RESPIRATION BLADDER Height & Weight     Self, Time, Situation, Place  O2 (4 liters) Incontinent, External catheter (foley cather) Weight: 43.1 kg Height:  5' (152.4 cm)  BEHAVIORAL SYMPTOMS/MOOD NEUROLOGICAL BOWEL NUTRITION STATUS      Continent Diet (see dc summary)  AMBULATORY STATUS COMMUNICATION OF NEEDS Skin   Limited Assist Verbally Normal                       Personal Care Assistance Level of Assistance  Bathing, Feeding, Dressing Bathing Assistance: Limited assistance Feeding assistance: Limited assistance Dressing Assistance: Limited assistance     Functional Limitations Info  Sight, Hearing, Speech Sight Info: Adequate Hearing Info: Adequate Speech Info: Adequate    SPECIAL CARE FACTORS FREQUENCY  PT (By licensed PT), OT (By licensed OT)     PT Frequency: 5x/week OT Frequency: 5x/week            Contractures Contractures Info: Not present    Additional Factors Info  Code Status, Allergies Code Status Info: FULL Allergies Info: No Known Allergies           Current Medications (05/22/2020):  This is the current hospital active medication list Current Facility-Administered Medications  Medication Dose Route Frequency Provider Last Rate Last Admin  . 0.9 %  sodium chloride  infusion (Manually program via Guardrails IV Fluids)   Intravenous Once Chesley Mires, MD      . 0.9 %  sodium chloride infusion  10 mL/hr Intravenous Once Wyvonnia Dusky, MD      . 0.9 %  sodium chloride infusion  250 mL Intravenous PRN Debbe Odea, MD 10 mL/hr at 05/22/20 1205 250 mL at 05/22/20 1205  . acetaminophen (TYLENOL) tablet 650 mg  650 mg Oral Q4H PRN Rizwan, Saima, MD       . albuterol (PROVENTIL) (2.5 MG/3ML) 0.083% nebulizer solution 2.5 mg  2.5 mg Nebulization Q4H PRN Chesley Mires, MD      . arformoterol (BROVANA) nebulizer solution 15 mcg  15 mcg Nebulization BID Chesley Mires, MD   15 mcg at 05/22/20 0903  . aspirin EC tablet 81 mg  81 mg Oral Daily Debbe Odea, MD   81 mg at 05/22/20 1007  . azithromycin (ZITHROMAX) tablet 500 mg  500 mg Oral Daily Debbe Odea, MD   500 mg at 05/22/20 1005  . cefTRIAXone (ROCEPHIN) 2 g in sodium chloride 0.9 % 100 mL IVPB  2 g Intravenous Q24H Debbe Odea, MD 200 mL/hr at 05/22/20 1207 2 g at 05/22/20 1207  . enoxaparin (LOVENOX) injection 30 mg  30 mg Subcutaneous Q24H Rizwan, Saima, MD      . furosemide (LASIX) injection 40 mg  40 mg Intravenous Once Debbe Odea, MD      . levothyroxine (SYNTHROID) tablet 50 mcg  50 mcg Oral Daily Debbe Odea, MD   50 mcg at 05/22/20 0504  . multivitamin with minerals tablet 1 tablet  1 tablet Oral Daily Debbe Odea, MD   1 tablet at 05/22/20 1010  . ondansetron (ZOFRAN) injection 4 mg  4 mg Intravenous Q6H PRN Rizwan, Eunice Blase, MD      . pantoprazole (PROTONIX) EC tablet 40 mg  40 mg Oral Daily Debbe Odea, MD   40 mg at 05/22/20 1005  . potassium chloride SA (KLOR-CON) CR tablet 40 mEq  40 mEq Oral Q4H Debbe Odea, MD   40 mEq at 05/22/20 1006  . revefenacin (YUPELRI) nebulizer solution 175 mcg  175 mcg Nebulization Daily Chesley Mires, MD   175 mcg at 05/22/20 0903  . rosuvastatin (CRESTOR) tablet 10 mg  10 mg Oral Daily Debbe Odea, MD   10 mg at 05/22/20 1004  . sodium chloride flush (NS) 0.9 % injection 3 mL  3 mL Intravenous Q12H Debbe Odea, MD   3 mL at 05/22/20 1011  . sodium chloride flush (NS) 0.9 % injection 3 mL  3 mL Intravenous PRN Debbe Odea, MD      . tamoxifen (NOLVADEX) tablet 20 mg  20 mg Oral Daily Debbe Odea, MD   20 mg at 05/22/20 1009     Discharge Medications: Please see discharge summary for a list of discharge medications.  Relevant  Imaging Results:  Relevant Lab Results:   Additional Information [phone]  Zenon Mayo, RN

## 2020-05-22 NOTE — Progress Notes (Signed)
SATURATION QUALIFICATIONS: (This note is used to comply with regulatory documentation for home oxygen)  Patient Saturations on Room Air at Rest = 80%  Patient Saturations on 4 Liters of oxygen while Ambulating = 96%  Please briefly explain why patient needs home oxygen:  Pt baseline at home is 4L [place]. Pt desat to 80% without oxygen while sitting in bed.

## 2020-05-22 NOTE — Progress Notes (Signed)
Ok to reduce lovenox to 30mg  SQ qday per Dr. Wynelle Cleveland due to low wt.  Onnie Boer, PharmD, BCIDP, AAHIVP, CPP Infectious Disease Pharmacist 05/22/2020 8:44 AM

## 2020-05-23 DIAGNOSIS — R918 Other nonspecific abnormal finding of lung field: Secondary | ICD-10-CM

## 2020-05-23 DIAGNOSIS — J181 Lobar pneumonia, unspecified organism: Secondary | ICD-10-CM | POA: Diagnosis not present

## 2020-05-23 DIAGNOSIS — R911 Solitary pulmonary nodule: Secondary | ICD-10-CM | POA: Diagnosis not present

## 2020-05-23 DIAGNOSIS — J449 Chronic obstructive pulmonary disease, unspecified: Secondary | ICD-10-CM | POA: Diagnosis not present

## 2020-05-23 DIAGNOSIS — Z170 Estrogen receptor positive status [ER+]: Secondary | ICD-10-CM

## 2020-05-23 DIAGNOSIS — R5381 Other malaise: Secondary | ICD-10-CM | POA: Diagnosis not present

## 2020-05-23 DIAGNOSIS — M6259 Muscle wasting and atrophy, not elsewhere classified, multiple sites: Secondary | ICD-10-CM | POA: Diagnosis not present

## 2020-05-23 DIAGNOSIS — E785 Hyperlipidemia, unspecified: Secondary | ICD-10-CM | POA: Diagnosis not present

## 2020-05-23 DIAGNOSIS — J9621 Acute and chronic respiratory failure with hypoxia: Secondary | ICD-10-CM

## 2020-05-23 DIAGNOSIS — C3491 Malignant neoplasm of unspecified part of right bronchus or lung: Secondary | ICD-10-CM | POA: Diagnosis not present

## 2020-05-23 DIAGNOSIS — R2681 Unsteadiness on feet: Secondary | ICD-10-CM | POA: Diagnosis not present

## 2020-05-23 DIAGNOSIS — I10 Essential (primary) hypertension: Secondary | ICD-10-CM | POA: Diagnosis not present

## 2020-05-23 DIAGNOSIS — J189 Pneumonia, unspecified organism: Secondary | ICD-10-CM

## 2020-05-23 DIAGNOSIS — C50412 Malignant neoplasm of upper-outer quadrant of left female breast: Secondary | ICD-10-CM

## 2020-05-23 DIAGNOSIS — S72115S Nondisplaced fracture of greater trochanter of left femur, sequela: Secondary | ICD-10-CM | POA: Diagnosis not present

## 2020-05-23 DIAGNOSIS — I5032 Chronic diastolic (congestive) heart failure: Secondary | ICD-10-CM | POA: Diagnosis not present

## 2020-05-23 DIAGNOSIS — E039 Hypothyroidism, unspecified: Secondary | ICD-10-CM | POA: Diagnosis not present

## 2020-05-23 DIAGNOSIS — C3492 Malignant neoplasm of unspecified part of left bronchus or lung: Secondary | ICD-10-CM | POA: Diagnosis not present

## 2020-05-23 DIAGNOSIS — I509 Heart failure, unspecified: Secondary | ICD-10-CM | POA: Diagnosis not present

## 2020-05-23 DIAGNOSIS — R040 Epistaxis: Secondary | ICD-10-CM | POA: Diagnosis not present

## 2020-05-23 DIAGNOSIS — J441 Chronic obstructive pulmonary disease with (acute) exacerbation: Secondary | ICD-10-CM | POA: Diagnosis not present

## 2020-05-23 DIAGNOSIS — J9611 Chronic respiratory failure with hypoxia: Secondary | ICD-10-CM | POA: Diagnosis not present

## 2020-05-23 DIAGNOSIS — I5033 Acute on chronic diastolic (congestive) heart failure: Secondary | ICD-10-CM

## 2020-05-23 DIAGNOSIS — C3490 Malignant neoplasm of unspecified part of unspecified bronchus or lung: Secondary | ICD-10-CM | POA: Diagnosis not present

## 2020-05-23 DIAGNOSIS — M6281 Muscle weakness (generalized): Secondary | ICD-10-CM | POA: Diagnosis not present

## 2020-05-23 DIAGNOSIS — C50212 Malignant neoplasm of upper-inner quadrant of left female breast: Secondary | ICD-10-CM | POA: Diagnosis not present

## 2020-05-23 DIAGNOSIS — I739 Peripheral vascular disease, unspecified: Secondary | ICD-10-CM | POA: Diagnosis not present

## 2020-05-23 DIAGNOSIS — E871 Hypo-osmolality and hyponatremia: Secondary | ICD-10-CM | POA: Diagnosis not present

## 2020-05-23 DIAGNOSIS — Z7401 Bed confinement status: Secondary | ICD-10-CM | POA: Diagnosis not present

## 2020-05-23 DIAGNOSIS — M2550 Pain in unspecified joint: Secondary | ICD-10-CM | POA: Diagnosis not present

## 2020-05-23 DIAGNOSIS — I6529 Occlusion and stenosis of unspecified carotid artery: Secondary | ICD-10-CM | POA: Diagnosis not present

## 2020-05-23 DIAGNOSIS — J9601 Acute respiratory failure with hypoxia: Secondary | ICD-10-CM

## 2020-05-23 DIAGNOSIS — D62 Acute posthemorrhagic anemia: Secondary | ICD-10-CM | POA: Diagnosis not present

## 2020-05-23 DIAGNOSIS — J9610 Chronic respiratory failure, unspecified whether with hypoxia or hypercapnia: Secondary | ICD-10-CM | POA: Diagnosis not present

## 2020-05-23 DIAGNOSIS — J9600 Acute respiratory failure, unspecified whether with hypoxia or hypercapnia: Secondary | ICD-10-CM | POA: Diagnosis not present

## 2020-05-23 DIAGNOSIS — S72115D Nondisplaced fracture of greater trochanter of left femur, subsequent encounter for closed fracture with routine healing: Secondary | ICD-10-CM | POA: Diagnosis not present

## 2020-05-23 DIAGNOSIS — R2689 Other abnormalities of gait and mobility: Secondary | ICD-10-CM | POA: Diagnosis not present

## 2020-05-23 DIAGNOSIS — I701 Atherosclerosis of renal artery: Secondary | ICD-10-CM

## 2020-05-23 LAB — BASIC METABOLIC PANEL
Anion gap: 8 (ref 5–15)
BUN: 7 mg/dL — ABNORMAL LOW (ref 8–23)
CO2: 40 mmol/L — ABNORMAL HIGH (ref 22–32)
Calcium: 8.5 mg/dL — ABNORMAL LOW (ref 8.9–10.3)
Chloride: 91 mmol/L — ABNORMAL LOW (ref 98–111)
Creatinine, Ser: 0.7 mg/dL (ref 0.44–1.00)
GFR, Estimated: >60 mL/min (ref >60)
Glucose, Bld: 97 mg/dL (ref 70–99)
Potassium: 4 mmol/L (ref 3.5–5.1)
Sodium: 139 mmol/L (ref 135–145)

## 2020-05-23 MED ORDER — FERROUS SULFATE 325 (65 FE) MG PO TABS: 325.0000 mg | ORAL_TABLET | Freq: Two times a day (BID) | ORAL | Status: DC

## 2020-05-23 MED ORDER — SENNA 8.6 MG PO TABS: 1.0000 | ORAL_TABLET | Freq: Every day | ORAL | 0 refills | Status: DC | PRN

## 2020-05-23 MED ORDER — FERROUS SULFATE 325 (65 FE) MG PO TABS
325.0000 mg | ORAL_TABLET | Freq: Two times a day (BID) | ORAL | 0 refills | Status: DC
Start: 2020-05-23 — End: 2020-08-01

## 2020-05-23 MED ORDER — CEFUROXIME AXETIL 500 MG PO TABS: 500.0000 mg | ORAL_TABLET | Freq: Two times a day (BID) | ORAL | 0 refills | Status: AC

## 2020-05-23 NOTE — Progress Notes (Signed)
Called to give report to [facility]. Phone rings with no answers.

## 2020-05-23 NOTE — Progress Notes (Signed)
Gave Report to Karma Ganja, LPN at [email or web address]

## 2020-05-23 NOTE — Discharge Summary (Signed)
Physician Discharge Summary  Kendra Watts JKD:[id number] DOB: [birth year] DOA: 05/20/2020  PCP: Merrilee Seashore, MD  Admit date: 05/20/2020 Discharge date: 05/23/2020  Admitted From: SNF Disposition:  SNF   Recommendations for Outpatient Follow-up:  1. Cont Lasix at prior dose and follow daily weights, BP and Bmet to decide if it needs to be stopped or increased 2. Metoprlol and Cardura on hold for hypotension 3. F/u on Hb and Iron levels in 1 month    Discharge Condition:  stable   CODE STATUS:  Full code   Diet recommendation:  Heart healthy Consultations:  pulmonary    Discharge Diagnoses:  Principal Problem:   Acute on chronic respiratory failure with hypoxia (HCC) Active Problems:   Acute on chronic diastolic CHF (congestive heart failure) (HCC)   COPD with acute exacerbation (HCC)   Anemia unspecified   Atherosclerotic renal artery stenosis, bilateral (HCC)   Breast cancer of upper-outer quadrant of left female breast ([place])   Chronic respiratory failure with hypoxia (HCC)   Multiple lung nodules     Brief Summary: Kendra Watts is a 74 y.o.femalewith medical history ofa recent left femur fracture after a fall (discharged to SNF ron 9/22), HTN, COPD, Chronic respiratory failure on 4 L O2 at home, lung nodules with nodules positive in the past for sq cell lung CA , Breast cancer, acute blood loss anemia due to epistaxis in September who presents from SNF for dyspnea. Pulse ox noted by EMS was 55 % on 2 L.EMS placed her on a non rebreather and gave her an albuterol inhaler.Her oxygen level improved and oxygen was weaned down to 6 L. Pulse ox was 94%. While in the ED she was noted to have a BP of 70/50 and given 2 L IVF.   She was also noted to have a Hb of 6.8 and was ordered blood (not yet transfused). While in the ED she became progressively hypoxic and short of breath and needed 6 L O2 and then a non rebreather mask again.  The patient is unable to explain  to me if at the facility she was short of breath at rest or with exertion. She has a mild cough which is not new. She has otherwise been doing well and recuperating from her fracture.  ED Course:See above.  - CTA (done before she became hypoxic the second time > mainly chronic changes in lungs andsmall bilateral pleural effusions.Acute to subacute left-sided rib fractures as detailed above without evidence for pneumothorax.  - ABG- pH 7.33, PCO2 76.1, pO2 145, pulse ox 99 - Given 40 mg IV Lasix after receiving 2 L IVF   Hospital Course:  Pulmonary edema- acute diastolic CHF Acute on chronic hypoxic respiratory failure - COPD- ? Exacerbation - Pneumonia - initiallyshort of breathpossibly due to a COPD exacerbation, per ED physicianand her nurseit resolved after the albuterol inhaler but she was still requiring 6 L o2 - second episodein EDlikely due to fluid overload from 2 L IVF-pulse ox is in mid 80s on 4 L -  ECHO>  EF 6065%, indeterminate LV diastolic parameters - cont nebs/ inhalers  - cough with yellow green sputum and blood- dyspnea on exertion with hypoxia, crackles at both bases- CT showing some consolidation/ atelectasis R > L - added Ceftriaxone and Azithromycin for pneumonia- cont to follow for improvement in dyspnea-  O2 dropping to 86% on exertion -  Diuresed with Lasix  while watching BP closely - she is back down to her baseline of  4 L at rest and with exertion - Continues to cough up brown mucous but hypoxia and dyspnea improved - d/c home with Ceftin to finish a 5 day course (total)- she will finish 3 days of 500 mg of Azithromycin today. - cont home dose of lasix while following BP, daily weights and Bmet - PCCM will arrange f/u with Dr Lamonte Sakai in 2 wks - Of note> states she dose become short of breath with anxiety - not interested in medications and states its manageable   Hypotension  - initial BP 70/50  - improved with2 LIVF- ? Ifshe was  initiallyover diursed (on Lasix as outpt) in setting of using Metoprolol and Doxazosin and having anemia - holding Metoprolol and Cardura- SBP still in 90s    Anemia, normocytic - Hemoccult neg - Hb 6.8-7 - she states she has not had any recent nose bleeds or blood in stools -  anemia panel> low Iron and Iron saturation, Ferritin may be falsely elevated in setting of infection -   transfused 1 U PRBC - Hb increased to 8.1   - will start oral iron + Senna PRN - please f/u Hb ans Iron levels in 1 mo  Breast Cancer - cont Tamoxifen  Lung nodules with h/o lung CA   - outpt follow up with contrast CT in 3 months recommended  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  Rib fractures - left sided- likely from last fall when she was admitted for a femur fracture- no acute pain  Apparent new skin thickening overlying the left breast - left breast is slightly erythematous - patient has not noted any acute changes- follow     Discharge Exam: Vitals:   05/23/20 0831 05/23/20 1121  BP:  110/70  Pulse: 94 86  Resp:  18  Temp:  98.8 F (37.1 C)  SpO2: 94% 95%   Vitals:   05/23/20 0826 05/23/20 0828 05/23/20 0831 05/23/20 1121  BP:  114/66  110/70  Pulse:  96 94 86  Resp:  19  18  Temp:  98.5 F (36.9 C)  98.8 F (37.1 C)  TempSrc:  Oral  Oral  SpO2: (!) 67% 95% 94% 95%  Weight:      Height:        General: Pt is alert, awake, not in acute distress Cardiovascular: RRR, S1/S2 +, no rubs, no gallops Respiratory: CTA bilaterally, no wheezing, no rhonchi Abdominal: Soft, NT, ND, bowel sounds + Extremities: no edema, no cyanosis   Discharge Instructions  Discharge Instructions    Diet - low sodium heart healthy   Complete by: As directed    Increase activity slowly   Complete by: As directed      Allergies as of 05/23/2020   No Known Allergies     Medication List    STOP taking these medications   Cardura 8 MG tablet Generic  drug: doxazosin   metoprolol tartrate 25 MG tablet Commonly known as: LOPRESSOR     TAKE these medications   acetaminophen 325 MG tablet Commonly known as: TYLENOL Take 325 mg by mouth every 6 (six) hours as needed for mild pain or moderate pain.   albuterol 108 (90 Base) MCG/ACT inhaler Commonly known as: VENTOLIN HFA Inhale 2 puffs into the lungs every 6 (six) hours as needed for wheezing or shortness of breath.   aspirin 81 MG tablet Take 1 tablet (81 mg total) by mouth daily.   CALCIUM 1200+D3 PO Take  1 tablet by mouth daily.   cefUROXime 500 MG tablet Commonly known as: CEFTIN Take 1 tablet (500 mg total) by mouth 2 (two) times daily for 3 days.   Crestor 10 MG tablet Generic drug: rosuvastatin Take 10 mg by mouth daily.   DERMACLOUD EX Apply 1 application topically in the morning and at bedtime. Apply to Buttocks   ferrous sulfate 325 (65 FE) MG tablet Take 1 tablet (325 mg total) by mouth 2 (two) times daily with a meal.   furosemide 20 MG tablet Commonly known as: Lasix Take 1 tablet (20 mg total) by mouth daily.   ibandronate 150 MG tablet Commonly known as: BONIVA Take 150 mg by mouth every 30 (thirty) days.   levothyroxine 50 MCG tablet Commonly known as: SYNTHROID Take 50 mcg by mouth daily.   multivitamin with minerals tablet Take 1 tablet by mouth daily.   OXYGEN Inhale 2-4 L into the lungs as needed. As needed to maintain SATS > 90%   pantoprazole 40 MG tablet Commonly known as: PROTONIX Take 1 tablet (40 mg total) by mouth daily.   senna 8.6 MG Tabs tablet Commonly known as: SENOKOT Take 1 tablet (8.6 mg total) by mouth daily as needed for mild constipation.   Stiolto Respimat 2.5-2.5 MCG/ACT Aers Generic drug: Tiotropium Bromide-Olodaterol Inhale 2 puffs into the lungs daily.   tamoxifen 20 MG tablet Commonly known as: NOLVADEX TAKE 1 TABLET BY MOUTH EVERY DAY       Follow-up Information    [facility]  Preferred SNF Follow up.   Specialty: [place] information: [place] [id number]             No Known Allergies    CT Head Wo Contrast  Result Date: 05/20/2020 CLINICAL DATA:  Syncope.  Shortness of breath for 3 days. EXAM: CT HEAD WITHOUT CONTRAST TECHNIQUE: Contiguous axial images were obtained from the base of the skull through the vertex without intravenous contrast. COMPARISON:  April 08, 2020 FINDINGS: Brain: No subdural, epidural, or subarachnoid hemorrhage. Cerebellum, brainstem, and basal cisterns are normal ventricles and sulci are stable. Mild white matter changes are identified. No acute cortical ischemia or infarct. No mass effect or midline shift. Vascular: Calcified atherosclerosis in the intracranial carotids. Skull: Normal. Negative for fracture or focal lesion. Sinuses/Orbits: No acute finding. Other: None. IMPRESSION: 1. No acute intracranial abnormalities identified. Chronic white matter changes. Electronically Signed   By: Dorise Bullion III M.D   On: 05/20/2020 15:36   CT Angio Chest PE W and/or Wo Contrast  Result Date: 05/20/2020 CLINICAL DATA:  Syncope.  Shortness of breath. EXAM: CT ANGIOGRAPHY CHEST WITH CONTRAST TECHNIQUE: Multidetector CT imaging of the chest was performed using the standard protocol during bolus administration of intravenous contrast. Multiplanar CT image reconstructions and MIPs were obtained to evaluate the vascular anatomy. CONTRAST:  66mL OMNIPAQUE IOHEXOL 350 MG/ML SOLN COMPARISON:  04/08/2020 FINDINGS: Cardiovascular: Contrast injection is sufficient to demonstrate satisfactory opacification of the pulmonary arteries to the segmental level. There is no pulmonary embolus or evidence of right heart strain. 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 This may have  worsened since the prior study but is suboptimally evaluated secondary to contrast timing. The right vertebral artery is not well visualized on this study which appears to be a new finding since April 08, 2020 the main pulmonary artery is  significantly dilated measuring up to approximately 3.5 cm in diameter. There are atherosclerotic changes throughout the coronary arteries and thoracic aorta without evidence for an aneurysm. There is no significant pericardial effusion. Mediastinum/Nodes: --there is apparent interval development of an enlarged subcarinal lymph node measuring up to approximately 1.7 cm. --there is mild new right hilar adenopathy. -- No axillary lymphadenopathy. -- No supraclavicular lymphadenopathy. -- Normal thyroid gland where visualized. -  Unremarkable esophagus. Lungs/Pleura: There are moderate severe emphysematous changes bilaterally. There is interlobular septal thickening. Fiducial markers are noted in the right upper lobe. There is relatively stable nodular densities at the right lung apex. There is atelectasis at the lung bases, right worse than left. There are relatively stable areas of scarring atelectasis in the left upper lobe with adjacent fiducial markers. There is a small right-sided pleural effusion. There is a trace left-sided pleural effusion. There is no pneumothorax. There is a persistent, relatively unchanged 1 cm spiculated nodule in the right lower lobe, suboptimally evaluated on this study secondary to adjacent airspace disease (axial series 6, image 104). Upper Abdomen: Contrast bolus timing is not optimized for evaluation of the abdominal organs. The visualized portions of the organs of the upper abdomen are normal. There is a high-grade stenosis of the origin of the celiac axis. Musculoskeletal: There is an old compression fracture of the T3 vertebral body. No new compression fracture identified on this study. There are acute to subacute mildly displaced fractures  involving the anterolateral third and fourth ribs on the left. There is nonspecific skin thickening involving the patient's left breast which appears to have progressed since the prior study. Review of the MIP images confirms the above findings. IMPRESSION: 1. No evidence for an acute pulmonary embolism 2. 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 This artery demonstrates a long segment high-grade stenosis proximally, which appears to have worsened since the prior study dated 04/08/2020. As such, no contrast is visualized in the right subclavian or right axillary arteries in addition to the right vertebral artery. These findings are not well evaluated secondary to contrast timing. Correlation with the patient's right upper extremity pulses is recommended. Additionally, a carotid artery ultrasound may be useful to detect flow within the right vertebral artery. This combination of findings may predispose the patient to a steal phenomenon. 3. Acute to subacute left-sided rib fractures as detailed above without evidence for pneumothorax. 4. Moderate severe emphysematous changes bilaterally with superimposed interstitial edema. There are small bilateral pleural effusions. 5. Cardiomegaly with findings of pulmonary artery hypertension. 6. Relatively stable nodular opacities in both lungs, somewhat limited in evaluation on today's study secondary to the presence of airspace disease and pleural effusions. 7. Apparent new subcarinal and mild right hilar adenopathy of unknown clinical significance. A repeat examination in 3 months with contrast should be performed to confirm stability or resolution. 8. Apparent new skin thickening overlying the left breast. Correlation with physical exam is recommended. Mammography is recommended if not recently performed. Aortic Atherosclerosis (ICD10-I70.0) and Emphysema (ICD10-J43.9). Electronically Signed   By: Constance Holster M.D.   On: 05/20/2020 15:47   DG Chest  Port 1 View  Result Date: 05/20/2020 CLINICAL DATA:  74 year old female with a history of shortness of breath EXAM: PORTABLE CHEST 1 VIEW COMPARISON:  04/08/2020, chest CT 04/08/2020 FINDINGS: Cardiomediastinal silhouette unchanged in size and contour. Interval development of interlobular septal thickening. Blunting of the right costophrenic angle new from the comparison. Coarsened interstitial markings. Surgical clips project over the bilateral lung apices.  Calcifications of the aortic arch again noted. Left sided rib fractures again demonstrated better seen on prior CT IMPRESSION: New CHF with small right pleural effusion. Electronically Signed   By: Corrie Mckusick D.O.   On: 05/20/2020 11:49   ECHOCARDIOGRAM COMPLETE  Result Date: 05/21/2020    ECHOCARDIOGRAM REPORT   Patient Name:   AMAN BATLEY [facility] Date of Exam: 05/21/2020 Medical Rec #:  [id number]       Height:       60.0 in Accession #:    [id number]      Weight:       96.8 lb Date of Birth:  March 16, 1946       BSA:          1.371 m Patient Age:    32 years        BP:           95/82 mmHg Patient Gender: F               HR:           100 bpm. Exam Location:  Inpatient Procedure: 2D Echo, Color Doppler and Limited Color Doppler Indications:    Cardiomegaly 429.3/ I51.7  History:        Patient has prior history of Echocardiogram examinations, most                 recent 07/06/2018. S/P breast cancer and radiation.  Sonographer:    Merrie Roof RDCS Referring Phys: [place]  1. Left ventricular ejection fraction, by estimation, is 60 to 65%. The left ventricle has normal function. The left ventricle has no regional wall motion abnormalities. Left ventricular diastolic parameters are indeterminate. Elevated left ventricular end-diastolic pressure.  2. Right ventricular systolic function is normal. The right ventricular size is normal. Tricuspid regurgitation signal is inadequate for assessing PA pressure.  3. The mitral valve is  degenerative. Mild mitral valve regurgitation. No evidence of mitral stenosis.  4. The aortic valve is tricuspid. Aortic valve regurgitation is not visualized. Mild aortic valve sclerosis is present, with no evidence of aortic valve stenosis.  5. The inferior vena cava is normal in size with greater than 50% respiratory variability, suggesting right atrial pressure of 3 mmHg. FINDINGS  Left Ventricle: Left ventricular ejection fraction, by estimation, is 60 to 65%. The left ventricle has normal function. The left ventricle has no regional wall motion abnormalities. The left ventricular internal cavity size was normal in size. There is  no left ventricular hypertrophy. Left ventricular diastolic parameters are indeterminate. Elevated left ventricular end-diastolic pressure. Right Ventricle: The right ventricular size is normal. No increase in right ventricular wall thickness. Right ventricular systolic function is normal. Tricuspid regurgitation signal is inadequate for assessing PA pressure. Left Atrium: Left atrial size was normal in size. Right Atrium: Right atrial size was normal in size. Pericardium: There is no evidence of pericardial effusion. Mitral Valve: The mitral valve is degenerative in appearance. There is mild thickening of the mitral valve leaflet(s). There is mild calcification of the mitral valve leaflet(s). Mild mitral valve regurgitation. No evidence of mitral valve stenosis. Tricuspid Valve: The tricuspid valve is normal in structure. Tricuspid valve regurgitation is trivial. No evidence of tricuspid stenosis. Aortic Valve: The aortic valve is tricuspid. Aortic valve regurgitation is not visualized. Mild aortic valve sclerosis is present, with no evidence of aortic valve stenosis. Pulmonic Valve: The pulmonic valve was normal in structure. Pulmonic valve regurgitation is not visualized. No evidence of  pulmonic stenosis. Aorta: The aortic root is normal in size and structure. Venous: The inferior  vena cava is normal in size with greater than 50% respiratory variability, suggesting right atrial pressure of 3 mmHg. IAS/Shunts: No atrial level shunt detected by color flow Doppler.  LEFT VENTRICLE PLAX 2D LVIDd:         4.00 cm     Diastology LVIDs:         2.70 cm     LV e' medial:    6.85 cm/s LV PW:         0.80 cm     LV E/e' medial:  19.1 LV IVS:        0.80 cm     LV e' lateral:   7.83 cm/s LVOT diam:     1.90 cm     LV E/e' lateral: 16.7 LV SV:         66 LV SV Index:   48 LVOT Area:     2.84 cm  LV Volumes (MOD) LV vol d, MOD A4C: 33.8 ml LV vol s, MOD A4C: 10.9 ml LV SV MOD A4C:     33.8 ml RIGHT VENTRICLE          IVC RV Basal diam:  3.30 cm  IVC diam: 1.90 cm LEFT ATRIUM             Index       RIGHT ATRIUM           Index LA diam:        3.20 cm 2.33 cm/m  RA Area:     11.20 cm LA Vol (A2C):   61.2 ml 44.59 ml/m RA Volume:   27.90 ml  20.35 ml/m LA Vol (A4C):   48.2 ml 35.15 ml/m LA Biplane Vol: 46.6 ml 33.98 ml/m  AORTIC VALVE LVOT Vmax:   134.00 cm/s LVOT Vmean:  82.300 cm/s LVOT VTI:    0.232 m  AORTA Ao Root diam: 2.80 cm Ao Asc diam:  2.80 cm MITRAL VALVE MV Area (PHT): 5.42 cm     SHUNTS MV Decel Time: 140 msec     Systemic VTI:  0.23 m MV E velocity: 131.00 cm/s  Systemic Diam: 1.90 cm MV A velocity: 122.00 cm/s MV E/A ratio:  1.07 Fransico Him MD Electronically signed by Fransico Him MD Signature Date/Time: 05/21/2020/1:19:16 PM    Final      The results of significant diagnostics from this hospitalization (including imaging, microbiology, ancillary and laboratory) are listed below for reference.     Microbiology: Recent Results (from the past 240 hour(s))  Culture, blood (routine x 2)     Status: None (Preliminary result)   Collection Time: 05/20/20 12:00 PM   Specimen: BLOOD RIGHT ARM  Result Value Ref Range Status   Specimen Description BLOOD RIGHT ARM  Final   Special Requests   Final    BOTTLES DRAWN AEROBIC AND ANAEROBIC Blood Culture adequate volume   Culture    Final    NO GROWTH 3 DAYS Performed at [facility], [address], [place], [place] [address]    Report Status PENDING  Incomplete  Respiratory Panel by RT PCR (Flu A&B, Covid) - Nasopharyngeal Swab     Status: None   Collection Time: 05/20/20 12:11 PM   Specimen: Nasopharyngeal Swab  Result Value Ref Range Status   SARS Coronavirus 2 by RT PCR NEGATIVE NEGATIVE Final    Comment: (NOTE) SARS-CoV-2 target nucleic acids are  NOT DETECTED.  The SARS-CoV-2 RNA is generally detectable in upper respiratoy specimens during the acute phase of infection. The lowest concentration of SARS-CoV-2 viral copies this assay can detect is 131 copies/mL. A negative result does not preclude SARS-Cov-2 infection and should not be used as the sole basis for treatment or other patient management decisions. A negative result may occur with  improper specimen collection/handling, submission of specimen other than nasopharyngeal swab, presence of viral mutation(s) within the areas targeted by this assay, and inadequate number of viral copies (<131 copies/mL). A negative result must be combined with clinical observations, patient history, and epidemiological information. The expected result is Negative.  Fact Sheet for Patients:  PinkCheek.be  Fact Sheet for Healthcare Providers:  GravelBags.it  This test is no t yet approved or cleared by the [place] FDA and  has been authorized for detection and/or diagnosis of SARS-CoV-2 by FDA under an Emergency Use Authorization (EUA). This EUA will remain  in effect (meaning this test can be used) for the duration of the COVID-19 declaration under Section 564(b)(1) of the Act, 21 U.S.C. section 360bbb-3(b)(1), unless the authorization is terminated or revoked sooner.     Influenza A by PCR NEGATIVE NEGATIVE Final   Influenza B by PCR NEGATIVE NEGATIVE Final    Comment: (NOTE) The Xpert  Xpress SARS-CoV-2/FLU/RSV assay is intended as an aid in  the diagnosis of influenza from Nasopharyngeal swab specimens and  should not be used as a sole basis for treatment. Nasal washings and  aspirates are unacceptable for Xpert Xpress SARS-CoV-2/FLU/RSV  testing.  Fact Sheet for Patients: PinkCheek.be  Fact Sheet for Healthcare Providers: GravelBags.it  This test is not yet approved or cleared by the [place] FDA and  has been authorized for detection and/or diagnosis of SARS-CoV-2 by  FDA under an Emergency Use Authorization (EUA). This EUA will remain  in effect (meaning this test can be used) for the duration of the  Covid-19 declaration under Section 564(b)(1) of the Act, 21  U.S.C. section 360bbb-3(b)(1), unless the authorization is  terminated or revoked. Performed at [facility], [place] [address] [place], [place] [address]   Culture, blood (routine x 2)     Status: None (Preliminary result)   Collection Time: 05/20/20 12:17 PM   Specimen: BLOOD RIGHT HAND  Result Value Ref Range Status   Specimen Description BLOOD RIGHT HAND  Final   Special Requests   Final    BOTTLES DRAWN AEROBIC ONLY Blood Culture results may not be optimal due to an inadequate volume of blood received in culture bottles   Culture   Final    NO GROWTH 3 DAYS Performed at [facility], [place] [address], [place], [place] [address]    Report Status PENDING  Incomplete  MRSA PCR Screening     Status: None   Collection Time: 05/22/20  4:39 AM   Specimen: Nasal Mucosa; Nasopharyngeal  Result Value Ref Range Status   MRSA by PCR NEGATIVE NEGATIVE Final    Comment:        The GeneXpert MRSA Assay (FDA approved for NASAL specimens only), is one component of a comprehensive MRSA colonization surveillance program. It is not intended to diagnose MRSA infection nor to guide or monitor treatment for MRSA infections. Performed at  [facility], [place] [address], [place], [place] [address]   SARS Coronavirus 2 by RT PCR (hospital order, performed in [facility] hospital lab) Nasopharyngeal Nasopharyngeal Swab  Status: None   Collection Time: 05/22/20  1:15 PM   Specimen: Nasopharyngeal Swab  Result Value Ref Range Status   SARS Coronavirus 2 NEGATIVE NEGATIVE Final    Comment: (NOTE) SARS-CoV-2 target nucleic acids are NOT DETECTED.  The SARS-CoV-2 RNA is generally detectable in upper and lower respiratory specimens during the acute phase of infection. The lowest concentration of SARS-CoV-2 viral copies this assay can detect is 250 copies / mL. A negative result does not preclude SARS-CoV-2 infection and should not be used as the sole basis for treatment or other patient management decisions.  A negative result may occur with improper specimen collection / handling, submission of specimen other than nasopharyngeal swab, presence of viral mutation(s) within the areas targeted by this assay, and inadequate number of viral copies (<250 copies / mL). A negative result must be combined with clinical observations, patient history, and epidemiological information.  Fact Sheet for Patients:   StrictlyIdeas.no  Fact Sheet for Healthcare Providers: BankingDealers.co.za  This test is not yet approved or  cleared by the [place] FDA and has been authorized for detection and/or diagnosis of SARS-CoV-2 by FDA under an Emergency Use Authorization (EUA).  This EUA will remain in effect (meaning this test can be used) for the duration of the COVID-19 declaration under Section 564(b)(1) of the Act, 21 U.S.C. section 360bbb-3(b)(1), unless the authorization is terminated or revoked sooner.  Performed at [facility], [place] [address] [place], [place] [address]      Labs: BNP (last 3 results) Recent Labs    03/13/20 0222 05/20/20 1832  BNP 317.8* 802.4*    Basic Metabolic Panel: Recent Labs  Lab 05/20/20 1200 05/20/20 1200 05/20/20 1832 05/21/20 0843 05/22/20 0427 05/22/20 0805 05/23/20 0349  NA 140  --   --  141 140 141 139  K 3.7  --   --  3.3* 3.1* 3.5 4.0  CL 96*  --   --  94* 91* 91* 91*  CO2 35*  --   --  37* 37* 40* 40*  GLUCOSE 140*  --   --  95 117* 112* 97  BUN 11  --   --  10 9 9  7*  CREATININE 0.90   < > 0.74 0.80 0.74 0.70 0.70  CALCIUM 8.4*  --   --  8.2* 8.1* 8.6* 8.5*  MG  --   --   --   --   --  1.8  --    < > = values in this interval not displayed.   Liver Function Tests: Recent Labs  Lab 05/20/20 1200  AST 43*  ALT 57*  ALKPHOS 66  BILITOT 0.3  PROT 5.5*  ALBUMIN 2.5*   No results for input(s): LIPASE, AMYLASE in the last 168 hours. No results for input(s): AMMONIA in the last 168 hours. CBC: Recent Labs  Lab 05/20/20 1141 05/20/20 1200 05/20/20 1832 05/21/20 0843 05/22/20 0427  WBC  --  11.9* 11.7* 9.0 9.5  NEUTROABS  --  10.1*  --   --   --   HGB 6.8* 6.4* 6.7* 7.0* 8.1*  HCT 20.0* 21.8* 22.4* 23.1* 25.4*  MCV  --  91.2 90.0 88.5 86.4  PLT  --  382 429* 433* 411*   Cardiac Enzymes: No results for input(s): CKTOTAL, CKMB, CKMBINDEX, TROPONINI in the last 168 hours. BNP: Invalid input(s): POCBNP CBG: No results for input(s): GLUCAP in the last 168 hours. D-Dimer No results for input(s): DDIMER in the last  72 hours. Hgb A1c No results for input(s): HGBA1C in the last 72 hours. Lipid Profile No results for input(s): CHOL, HDL, LDLCALC, TRIG, CHOLHDL, LDLDIRECT in the last 72 hours. Thyroid function studies No results for input(s): TSH, T4TOTAL, T3FREE, THYROIDAB in the last 72 hours.  Invalid input(s): FREET3 Anemia work up Recent Labs    05/20/20 1832  VITAMINB12 492  FOLATE 42.6  FERRITIN 110  TIBC 204*  IRON 13*  RETICCTPCT 1.1   Urinalysis    Component Value Date/Time   COLORURINE STRAW (A) 03/13/2020 1128   APPEARANCEUR CLEAR 03/13/2020 1128   LABSPEC 1.006  03/13/2020 1128   PHURINE 6.0 03/13/2020 1128   GLUCOSEU NEGATIVE 03/13/2020 1128   HGBUR NEGATIVE 03/13/2020 1128   [place] 03/13/2020 1128   [place] 03/13/2020 1128   PROTEINUR NEGATIVE 03/13/2020 1128   UROBILINOGEN 1.0 03/27/2012 1119   NITRITE NEGATIVE 03/13/2020 1128   LEUKOCYTESUR NEGATIVE 03/13/2020 1128   Sepsis Labs Invalid input(s): PROCALCITONIN,  WBC,  LACTICIDVEN Microbiology Recent Results (from the past 240 hour(s))  Culture, blood (routine x 2)     Status: None (Preliminary result)   Collection Time: 05/20/20 12:00 PM   Specimen: BLOOD RIGHT ARM  Result Value Ref Range Status   Specimen Description BLOOD RIGHT ARM  Final   Special Requests   Final    BOTTLES DRAWN AEROBIC AND ANAEROBIC Blood Culture adequate volume   Culture   Final    NO GROWTH 3 DAYS Performed at [facility], [place] [address], [place], [place] [address]    Report Status PENDING  Incomplete  Respiratory Panel by RT PCR (Flu A&B, Covid) - Nasopharyngeal Swab     Status: None   Collection Time: 05/20/20 12:11 PM   Specimen: Nasopharyngeal Swab  Result Value Ref Range Status   SARS Coronavirus 2 by RT PCR NEGATIVE NEGATIVE Final    Comment: (NOTE) SARS-CoV-2 target nucleic acids are NOT DETECTED.  The SARS-CoV-2 RNA is generally detectable in upper respiratoy specimens during the acute phase of infection. The lowest concentration of SARS-CoV-2 viral copies this assay can detect is 131 copies/mL. A negative result does not preclude SARS-Cov-2 infection and should not be used as the sole basis for treatment or other patient management decisions. A negative result may occur with  improper specimen collection/handling, submission of specimen other than nasopharyngeal swab, presence of viral mutation(s) within the areas targeted by this assay, and inadequate number of viral copies (<131 copies/mL). A negative result must be combined with clinical observations,  patient history, and epidemiological information. The expected result is Negative.  Fact Sheet for Patients:  PinkCheek.be  Fact Sheet for Healthcare Providers:  GravelBags.it  This test is no t yet approved or cleared by the [place] FDA and  has been authorized for detection and/or diagnosis of SARS-CoV-2 by FDA under an Emergency Use Authorization (EUA). This EUA will remain  in effect (meaning this test can be used) for the duration of the COVID-19 declaration under Section 564(b)(1) of the Act, 21 U.S.C. section 360bbb-3(b)(1), unless the authorization is terminated or revoked sooner.     Influenza A by PCR NEGATIVE NEGATIVE Final   Influenza B by PCR NEGATIVE NEGATIVE Final    Comment: (NOTE) The Xpert Xpress SARS-CoV-2/FLU/RSV assay is intended as an aid in  the diagnosis of influenza from Nasopharyngeal swab specimens and  should not be used as a sole basis for treatment. Nasal washings and  aspirates are unacceptable for Xpert Xpress  SARS-CoV-2/FLU/RSV  testing.  Fact Sheet for Patients: PinkCheek.be  Fact Sheet for Healthcare Providers: GravelBags.it  This test is not yet approved or cleared by the [place] FDA and  has been authorized for detection and/or diagnosis of SARS-CoV-2 by  FDA under an Emergency Use Authorization (EUA). This EUA will remain  in effect (meaning this test can be used) for the duration of the  Covid-19 declaration under Section 564(b)(1) of the Act, 21  U.S.C. section 360bbb-3(b)(1), unless the authorization is  terminated or revoked. Performed at [facility], [place] [address] [place], [place] [address]   Culture, blood (routine x 2)     Status: None (Preliminary result)   Collection Time: 05/20/20 12:17 PM   Specimen: BLOOD RIGHT HAND  Result Value Ref Range Status   Specimen Description BLOOD RIGHT HAND   Final   Special Requests   Final    BOTTLES DRAWN AEROBIC ONLY Blood Culture results may not be optimal due to an inadequate volume of blood received in culture bottles   Culture   Final    NO GROWTH 3 DAYS Performed at [facility], [place] [address], [place], [place] [address]    Report Status PENDING  Incomplete  MRSA PCR Screening     Status: None   Collection Time: 05/22/20  4:39 AM   Specimen: Nasal Mucosa; Nasopharyngeal  Result Value Ref Range Status   MRSA by PCR NEGATIVE NEGATIVE Final    Comment:        The GeneXpert MRSA Assay (FDA approved for NASAL specimens only), is one component of a comprehensive MRSA colonization surveillance program. It is not intended to diagnose MRSA infection nor to guide or monitor treatment for MRSA infections. Performed at [facility], [place] [address], [place], [place] [address]   SARS Coronavirus 2 by RT PCR (hospital order, performed in [facility] hospital lab) Nasopharyngeal Nasopharyngeal Swab     Status: None   Collection Time: 05/22/20  1:15 PM   Specimen: Nasopharyngeal Swab  Result Value Ref Range Status   SARS Coronavirus 2 NEGATIVE NEGATIVE Final    Comment: (NOTE) SARS-CoV-2 target nucleic acids are NOT DETECTED.  The SARS-CoV-2 RNA is generally detectable in upper and lower respiratory specimens during the acute phase of infection. The lowest concentration of SARS-CoV-2 viral copies this assay can detect is 250 copies / mL. A negative result does not preclude SARS-CoV-2 infection and should not be used as the sole basis for treatment or other patient management decisions.  A negative result may occur with improper specimen collection / handling, submission of specimen other than nasopharyngeal swab, presence of viral mutation(s) within the areas targeted by this assay, and inadequate number of viral copies (<250 copies / mL). A negative result must be combined with clinical observations, patient history, and  epidemiological information.  Fact Sheet for Patients:   StrictlyIdeas.no  Fact Sheet for Healthcare Providers: BankingDealers.co.za  This test is not yet approved or  cleared by the [place] FDA and has been authorized for detection and/or diagnosis of SARS-CoV-2 by FDA under an Emergency Use Authorization (EUA).  This EUA will remain in effect (meaning this test can be used) for the duration of the COVID-19 declaration under Section 564(b)(1) of the Act, 21 U.S.C. section 360bbb-3(b)(1), unless the authorization is terminated or revoked sooner.  Performed at [name], [place] [address] [place], [place] [address]      Time coordinating discharge in minutes:  65  SIGNED:   Debbe Odea, MD  [facility] 05/23/2020, 12:21 PM

## 2020-05-23 NOTE — Progress Notes (Signed)
PCCM Interval Note  Patient planned for discharge. Will arrange Pulmonary follow-up with Dr. Lamonte Sakai, her primary pulmonologist in 2 weeks.  Thank you for involving our team in your patient's care.  Rodman Pickle, M.D. [facility] Pulmonary/Critical Care Medicine 05/23/2020 9:04 AM

## 2020-05-23 NOTE — TOC Transition Note (Addendum)
Transition of Care [facility]) - CM/SW Discharge Note   Patient Details  Name: Kendra Watts MRN: [id number] Date of Birth: 1946/01/20  [facility] CM/SW Contact:  Zenon Mayo, RN Phone Number: 05/23/2020, 12:50 PM   Clinical Narrative:    Patient is for dc today, NCM spoke with Yvone Neu  Her nephew to let him know of dc today.  NCM called and left message with Jaimie at [facility]. Awaiting call back with the number for the Staff RN to call report to. Patient is going to room 206 , Staff RN to call Report to [phone] ext [phone].    Final next level of care: Skilled Nursing Facility Barriers to Discharge: No Barriers Identified   Patient Goals and CMS Choice Patient states their goals for this hospitalization and ongoing recovery are:: SNF CMS Medicare.gov Compare Post Acute Care list provided to:: Patient Choice offered to / list presented to : Patient  Discharge Placement              Patient [facility] bed at: [facility] Patient to be transferred to facility by: ptar Name of family member notified: ken Patient and family notified of of transfer: 05/23/20  Discharge Plan and Services   Discharge Planning Services: CM Consult Post Acute Care Choice: NA            DME Agency: NA       HH Arranged: NA          Social Determinants of Health (SDOH) Interventions     Readmission Risk Interventions Readmission Risk Prevention Plan 03/16/2020  Transportation Screening Complete  PCP or Specialist Appt within 3-5 Days Complete  HRI or [place] Complete  Social Work Consult for [place] Planning/Counseling Complete  Palliative Care Screening Not Applicable  Medication Review Press photographer) Complete  Some recent data might be hidden

## 2020-05-23 NOTE — Progress Notes (Signed)
  Mobility Specialist Criteria Algorithm Info.  SATURATION QUALIFICATIONS: (This note is used to comply with regulatory documentation for home oxygen)  Patient Saturations on Room Air at Rest = n/a%  Patient Saturations on Room Air while Ambulating = n/a%  Patient Saturations on 4 Liters of oxygen while Ambulating = 93%  Please briefly explain why patient needs home oxygen: Pt is on 4LO2 chronically and saturated well with ambulation.   Mobility Team:  HOB elevated: Activity: Ambulated in hall;Dangled on edge of bed Range of motion: Active;All extremities Level of assistance: Standby assist, set-up cues, supervision of patient - no hands on Assistive device: Front wheel walker Minutes sitting in chair:  Minutes stood: 5 minutes Minutes ambulated: 5 minutes Distance ambulated (ft): 300 ft Mobility response: Tolerated well;RN notified Bed Position: Semi-fowlers  Pt more than willing to participate in mobility this morning. Pt required supervision no-hands on assist to get to the EOB supine/sit. Ambulated in hallway 315f, mod I with RW. Completed education on energy conservation and pursed lip breathing to help maintain oxygen saturation. Tolerated ambulation well without any complaints other than SOB. Pt is now laying back in bed with all needs met.   05/23/2020 11:48 AM

## 2020-05-24 ENCOUNTER — Other Ambulatory Visit: Payer: Self-pay | Admitting: *Deleted

## 2020-05-24 DIAGNOSIS — E871 Hypo-osmolality and hyponatremia: Secondary | ICD-10-CM | POA: Diagnosis not present

## 2020-05-24 DIAGNOSIS — E039 Hypothyroidism, unspecified: Secondary | ICD-10-CM | POA: Diagnosis not present

## 2020-05-24 DIAGNOSIS — C3490 Malignant neoplasm of unspecified part of unspecified bronchus or lung: Secondary | ICD-10-CM | POA: Diagnosis not present

## 2020-05-24 DIAGNOSIS — J9610 Chronic respiratory failure, unspecified whether with hypoxia or hypercapnia: Secondary | ICD-10-CM | POA: Diagnosis not present

## 2020-05-24 LAB — TYPE AND SCREEN
ABO/RH(D): O NEG
Antibody Screen: POSITIVE
Unit division: 0
Unit division: 0

## 2020-05-24 LAB — BPAM RBC
Blood Product Expiration Date: 202111052359
Blood Product Expiration Date: 202111052359
ISSUE DATE / TIME: 202110180951
ISSUE DATE / TIME: 202110311926
Unit Type and Rh: 9500
Unit Type and Rh: 9500

## 2020-05-24 NOTE — Patient Outreach (Signed)
[place] Coordinator follow up. Member screened for potential [facility] Care Management needs as a benefit of [place] Medicare.  Ms. Kissoon readmitted to hospital on 05/20/20 and has since returned to [facility] SNF.   Will continue to follow for transition plans and for potential [facility] Care Management needs.    [name], MSN-Ed, RN,BSN [place] Acute Care Coordinator [phone] [facility]) [phone]  (Toll free office)

## 2020-05-25 DIAGNOSIS — J449 Chronic obstructive pulmonary disease, unspecified: Secondary | ICD-10-CM | POA: Diagnosis not present

## 2020-05-25 LAB — CULTURE, BLOOD (ROUTINE X 2)
Culture: NO GROWTH
Culture: NO GROWTH
Special Requests: ADEQUATE

## 2020-05-27 DIAGNOSIS — J449 Chronic obstructive pulmonary disease, unspecified: Secondary | ICD-10-CM | POA: Diagnosis not present

## 2020-05-27 DIAGNOSIS — C50212 Malignant neoplasm of upper-inner quadrant of left female breast: Secondary | ICD-10-CM | POA: Diagnosis not present

## 2020-05-27 DIAGNOSIS — J181 Lobar pneumonia, unspecified organism: Secondary | ICD-10-CM | POA: Diagnosis not present

## 2020-05-27 DIAGNOSIS — J9610 Chronic respiratory failure, unspecified whether with hypoxia or hypercapnia: Secondary | ICD-10-CM | POA: Diagnosis not present

## 2020-05-27 DIAGNOSIS — I509 Heart failure, unspecified: Secondary | ICD-10-CM | POA: Diagnosis not present

## 2020-05-27 DIAGNOSIS — C3490 Malignant neoplasm of unspecified part of unspecified bronchus or lung: Secondary | ICD-10-CM | POA: Diagnosis not present

## 2020-05-27 DIAGNOSIS — I739 Peripheral vascular disease, unspecified: Secondary | ICD-10-CM | POA: Diagnosis not present

## 2020-05-27 DIAGNOSIS — I10 Essential (primary) hypertension: Secondary | ICD-10-CM | POA: Diagnosis not present

## 2020-06-05 ENCOUNTER — Other Ambulatory Visit: Payer: Self-pay | Admitting: *Deleted

## 2020-06-05 NOTE — Patient Outreach (Signed)
[facility]N Post- Acute Care Coordinator follow up. Member screened for potential [facility] Care Management needs as a benefit of [place] Medicare.  Verified in Patient [name] that member resides in [facility] SNF.   Communication sent to SNF SW to inquire about transition plans and potential [facility] Care Management needs.   Will continue to follow while member resides in SNF.    [name], MSN-Ed, RN,BSN [place] Acute Care Coordinator [phone] [facility])

## 2020-06-06 ENCOUNTER — Other Ambulatory Visit: Payer: Self-pay | Admitting: *Deleted

## 2020-06-06 DIAGNOSIS — S72115D Nondisplaced fracture of greater trochanter of left femur, subsequent encounter for closed fracture with routine healing: Secondary | ICD-10-CM | POA: Diagnosis not present

## 2020-06-06 NOTE — Patient Outreach (Signed)
[facility]N Post- Acute Care Coordinator follow up. Member screened for potential [facility] Care Management needs as a benefit of [place] Medicare.  Update received from [facility] SNF SW indicating member's transition plan is to return home alone. SW reports member can benefit from [place] Management follow up.   Will plan outreach to member to discuss potential [facility] Care Management services.   Will continue to follow while member resides in SNF.    [name], MSN, RN,BSN [place] Acute Care Coordinator [phone] [facility]) [phone]  (Toll free office)

## 2020-06-07 ENCOUNTER — Encounter: Payer: Self-pay | Admitting: Primary Care

## 2020-06-07 ENCOUNTER — Ambulatory Visit (INDEPENDENT_AMBULATORY_CARE_PROVIDER_SITE_OTHER): Payer: Medicare Other | Admitting: Primary Care

## 2020-06-07 ENCOUNTER — Other Ambulatory Visit: Payer: Self-pay

## 2020-06-07 VITALS — BP 108/64 | HR 75 | Ht 60.0 in | Wt 96.6 lb

## 2020-06-07 DIAGNOSIS — I5032 Chronic diastolic (congestive) heart failure: Secondary | ICD-10-CM | POA: Diagnosis not present

## 2020-06-07 DIAGNOSIS — J9611 Chronic respiratory failure with hypoxia: Secondary | ICD-10-CM | POA: Diagnosis not present

## 2020-06-07 DIAGNOSIS — R918 Other nonspecific abnormal finding of lung field: Secondary | ICD-10-CM

## 2020-06-07 DIAGNOSIS — R911 Solitary pulmonary nodule: Secondary | ICD-10-CM

## 2020-06-07 DIAGNOSIS — J449 Chronic obstructive pulmonary disease, unspecified: Secondary | ICD-10-CM

## 2020-06-07 NOTE — Assessment & Plan Note (Signed)
Patient was hospitalized on 05/20/20 for acute on chronic respiratory failure secondary to interstitial edema and 3rd/4th left rib fracture in the setting of severe COPD. CTA was negative for PE, she had some atelectasis R>L and small bil pleural effusions. She was treated for suspected pneumonia and diuresed. She is doing well today, still at SNF but has been signed off to be discharged home next week. She remains on 4L oxygen, qualified for POC today. DME company is [employer].

## 2020-06-07 NOTE — Addendum Note (Signed)
Addended by: Lorretta Harp on: 06/07/2020 12:13 PM   Modules accepted: Orders

## 2020-06-07 NOTE — Assessment & Plan Note (Signed)
Repeat CT chest in May 2021 showed pulmonary nodules were stable in size and appearance except for a 1cm nodule RLL which is slightly bigger. CTA imaging from most recent hospitalization in October 2021 showed stable, unchanged 1cm RLL nodule with new mediastinal adenopathy. She was treated for suspect pneumonia at this time so adenopathy could be reactive. Needs repeat CT chest with contrast in 3 months to monitor. If imaging shows growth then will need to discuss with Dr. Sondra Come either empiric XRT or discuss risk/benefits of diagnostic procedure.

## 2020-06-07 NOTE — Progress Notes (Signed)
@Patient  ID: Kendra Watts, female    DOB: 1945/08/26, 74 y.o.   MRN: [id number]  Chief Complaint  Patient presents with  . Hospitalization Follow-up    Pt states she is slowly getting better as each day goes on. Pt was discharged home from hospital on oxygen and currently uses it 24/7.    Referring provider: Merrilee Seashore, MD  HPI: 74 year old female, current every day smoker. PMH significant for COPD, chronic respiratory failure with hypoxia, multiple lung nodules, adenocarcinoma right lung s/p  XRT, HTN, chronic diastolic HF, breast cancer 2017, hyperlipidemia, carotid artery stenosis, left femur fracture, multiple rib fractures. Patient of Dr. Lamonte Sakai, last seen for virtual televisit on 10/27/19.Maintained on Stiolto. She is on 4L oxygen at baseline.  Repeat CT chest in May 2021 showed pulmonary nodules are stable in size and appearance except for a 1cm nodules RLL which is slightly bigger. Needs follow-up in 6 months with repeat CT, if it shows growth then will need to discuss with Dr. Sondra Come either empiric XRT or discuss risk/benefits of diagnostic procedure.   Hospital course 05/20/20- 05/23/20: Recent hx left femur fracture after a fall, discharged to SNF. Patient presented to ED with dyspnea. She was found to be hypoxic at 55% on 2L with associated hypotension and anemia. Consulted by Dr. Halford Chessman on 05/20/20 with PCCM. CTA showed mainly chronic changes in lungs, atelectasis R>L and small bilateral pleural effusion. Acute and subacute left sided rib fractures without evidence of pneumothorax. She had mediastinal adenopathy on imaging, hx breast and lung cancer. Treated with Ceftriaxone and Azithromycin for pneumonia. Diuresed with IV lasix. Acute on chronic respiratory failure felt to be secondary to interstitial edema and 3rd/4th left rib fracture in setting of severe COPD with emphysema.  Previously followed by [name] with oncology and Dr. Sondra Come with radiation/oncology. Needs  follow-up imaging outpatient.   06/07/2020- Interim hx  Patient presents today for hospital follow-up. She is currently living SNF [facility]. She has been there for 3 months d/t initial fall requiring hospitalization and subsequent hospitalization in October for respiratory failure.   She is feeling well. Breathing has been stable. She has been using Stiolto as prescribed. She has not needed to use Albuterol. She has slight cough, initially it was dark in color and has improved. She has had no episodes of dyspnea or lightheadedness. Denies chest tightness/pain, shortness of breath.   Patient reports that she had repeat imaging left femur which looked reassuring. She has been attending physical therapy at SNF and has been cleared to go home. She typically lives alone, she is close with two of her neighbors. One of them has her dog. She has a walker at home.   Need outpatient follow-up CT chest with contrast in 3 months   Imaging: 06/11/2019 CT chest- showed no lymphadenopathy, 8 x 12 mm medial right upper lobe nodule, 10 mm left upper lobe nodule (smaller), 6 mm chelated right lower lobe nodule (unchanged). The MRI spine identified 1.5x0.6cm nodule in the posterior RUL. I reviewed the images.  Not clear to me that this is one of the previously identified nodules.  05/20/20 CTA- Negative for PE. Moderate- severe emphysematous changes bilaterally. Persistent, relatively unchanged 1cm spiculated nodule RLL and stable nodular densities right lung apex. Atelectasis at lung bases R>L, Small right sided pleural effusion, trace left sided pleural effusion. Acute-subacute left sided rib fractures without evidence of pneumothroax. New subcarinal and mild right bilar adenopathy, repeat examination in 3 months with contrast to  confirm stability. New skin thickening overlying left breast. Aberrant right subclavian artery.   No Known Allergies  Immunization History  Administered Date(s) Administered  . Fluad  Quad(high Dose 65+) 05/20/2019  . Influenza Split 04/03/2012  . Influenza, High Dose Seasonal PF 07/13/2018, 05/24/2020  . Influenza-Unspecified 05/22/2014  . PFIZER SARS-COV-2 Vaccination 10/22/2019, 11/11/2019  . Pneumococcal-Unspecified 04/21/2014    Past Medical History:  Diagnosis Date  . Anemia   . 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 Breast cancer ([place]) 09/22/2015   Malignant  . Breast cancer of upper-outer quadrant of left female breast ([place]) 09/08/2015  . Cataract, immature   . CHF (congestive heart failure) ([place])    Acute CHF-06/2018  . COPD (chronic obstructive pulmonary disease) ([place])   . Femur fracture, left ([place]) 04/11/2020   left femur fracture( greater trochanter  . Heart murmur    states no known problems  . History of hyperthyroidism   . Hyperlipidemia   . Hypertension    states under control with meds., has been on med. x "long time"  . Hypokalemia    from last physical.   . Hypothyroidism   . Nonfunctioning kidney    left  . Personal history of radiation therapy    2017  . Pulmonary nodules    Bilateral  . Radiation 10/30/15-11/28/15   left breast 42.72 Gy, boosted to 10 Gy  . Renal artery stenosis ([place])   . Tobacco abuse   . Wears partial dentures    upper and lower    Tobacco History: Social History   Tobacco Use  Smoking Status Current Every Day Smoker  . Packs/day: 0.50  . Years: 40.00  . Pack years: 20.00  . Types: Cigarettes  Smokeless Tobacco Never Used  Tobacco Comment   last smoked 3 months ago since going to rehab   Ready to quit: Not Answered Counseling given: Not Answered Comment: last smoked 3 months ago since going to rehab   Outpatient Medications Prior to Visit  Medication Sig Dispense Refill  . acetaminophen (TYLENOL) 325 MG tablet Take 325 mg by mouth every 6 (six) hours as needed for mild pain or moderate pain.    Marland Kitchen albuterol (VENTOLIN HFA) 108 (90 Base) MCG/ACT inhaler Inhale 2 puffs into the lungs every 6  (six) hours as needed for wheezing or shortness of breath. 8 g 3  . aspirin 81 MG tablet Take 1 tablet (81 mg total) by mouth daily.    . Calcium-Magnesium-Vitamin D (CALCIUM 1200+D3 PO) Take 1 tablet by mouth daily.    . ferrous sulfate 325 (65 FE) MG tablet Take 1 tablet (325 mg total) by mouth 2 (two) times daily with a meal.  0  . furosemide (LASIX) 20 MG tablet Take 20 mg by mouth.    . ibandronate (BONIVA) 150 MG tablet Take 150 mg by mouth every 30 (thirty) days.    Marland Kitchen levothyroxine (SYNTHROID) 50 MCG tablet Take 50 mcg by mouth daily.    . Multiple Vitamins-Minerals (MULTIVITAMIN WITH MINERALS) tablet Take 1 tablet by mouth daily.     . OXYGEN Inhale 2-4 L into the lungs as needed. As needed to maintain SATS > 90%    . pantoprazole (PROTONIX) 40 MG tablet Take 1 tablet (40 mg total) by mouth daily. 30 tablet 0  . rosuvastatin (CRESTOR) 10 MG tablet Take 10 mg by mouth daily.     . tamoxifen (NOLVADEX) 20 MG tablet TAKE 1 TABLET BY MOUTH EVERY DAY (  Patient taking differently: Take 20 mg by mouth daily. ) 90 tablet 0  . Tiotropium Bromide-Olodaterol (STIOLTO RESPIMAT) 2.5-2.5 MCG/ACT AERS Inhale 2 puffs into the lungs daily. 4 g 12  . furosemide (LASIX) 20 MG tablet Take 1 tablet (20 mg total) by mouth daily. 30 tablet 0  . Infant Care Products [facility] EX) Apply 1 application topically in the morning and at bedtime. Apply to Buttocks    . senna (SENOKOT) 8.6 MG TABS tablet Take 1 tablet (8.6 mg total) by mouth daily as needed for mild constipation. 120 tablet 0   No facility-administered medications prior to visit.   Review of Systems  Review of Systems  Constitutional: Negative for fatigue.  HENT: Positive for postnasal drip.   Respiratory: Positive for cough. Negative for chest tightness, shortness of breath and wheezing.   Cardiovascular: Negative.    Physical Exam  BP 108/64 (BP Location: Right Arm, Cuff Size: Normal)   Pulse 75   Ht 5' (1.524 m)   Wt 96 lb 9.6 oz (43.8  kg)   SpO2 100%   BMI 18.87 kg/m  Physical Exam Constitutional:      General: She is not in acute distress.    Appearance: Normal appearance. She is not toxic-appearing or diaphoretic.     Comments: Appears undeweight  HENT:     Head: Normocephalic and atraumatic.     Mouth/Throat:     Mouth: Mucous membranes are moist.     Pharynx: Oropharynx is clear. No oropharyngeal exudate.  Cardiovascular:     Rate and Rhythm: Normal rate and regular rhythm.  Pulmonary:     Effort: Pulmonary effort is normal.     Breath sounds: Normal breath sounds. No wheezing or rhonchi.  Musculoskeletal:     Cervical back: Normal range of motion and neck supple.     Comments: In WC, ambulates with walker  Lymphadenopathy:     Cervical: No cervical adenopathy.  Skin:    General: Skin is warm and dry.  Neurological:     General: No focal deficit present.     Mental Status: She is alert and oriented to person, place, and time. Mental status is at baseline.  Psychiatric:        Mood and Affect: Mood normal.        Behavior: Behavior normal.        Thought Content: Thought content normal.        Judgment: Judgment normal.      Lab Results:  CBC    Component Value Date/Time   WBC 9.5 05/22/2020 0427   RBC 2.94 (L) 05/22/2020 0427   HGB 8.1 (L) 05/22/2020 0427   HGB 14.4 09/13/2015 1224   HCT 25.4 (L) 05/22/2020 0427   HCT 43.9 09/13/2015 1224   PLT 411 (H) 05/22/2020 0427   PLT 212 09/13/2015 1224   MCV 86.4 05/22/2020 0427   MCV 96.3 09/13/2015 1224   MCH 27.6 05/22/2020 0427   MCHC 31.9 05/22/2020 0427   RDW 16.6 (H) 05/22/2020 0427   RDW 15.7 (H) 09/13/2015 1224   LYMPHSABS 0.5 (L) 05/20/2020 1200   LYMPHSABS 1.2 09/13/2015 1224   MONOABS 1.0 05/20/2020 1200   MONOABS 0.5 09/13/2015 1224   EOSABS 0.2 05/20/2020 1200   EOSABS 0.1 09/13/2015 1224   BASOSABS 0.0 05/20/2020 1200   BASOSABS 0.1 09/13/2015 1224    BMET    Component Value Date/Time   NA 139 05/23/2020 0349   NA 139  09/13/2015 1224  K 4.0 05/23/2020 0349   K 4.1 09/13/2015 1224   CL 91 (L) 05/23/2020 0349   CO2 40 (H) 05/23/2020 0349   CO2 22 09/13/2015 1224   GLUCOSE 97 05/23/2020 0349   GLUCOSE 157 (H) 09/13/2015 1224   BUN 7 (L) 05/23/2020 0349   BUN 14.8 09/13/2015 1224   CREATININE 0.70 05/23/2020 0349   CREATININE 0.96 02/24/2019 1142   CREATININE 1.0 09/13/2015 1224   CALCIUM 8.5 (L) 05/23/2020 0349   CALCIUM 9.8 09/13/2015 1224   GFRNONAA >60 05/23/2020 0349   GFRNONAA 59 (L) 02/24/2019 1142   GFRAA >60 04/12/2020 1057   GFRAA >60 02/24/2019 1142    BNP    Component Value Date/Time   BNP 802.4 (H) 05/20/2020 1832    ProBNP No results found for: PROBNP  Imaging: CT Head Wo Contrast  Result Date: 05/20/2020 CLINICAL DATA:  Syncope.  Shortness of breath for 3 days. EXAM: CT HEAD WITHOUT CONTRAST TECHNIQUE: Contiguous axial images were obtained from the base of the skull through the vertex without intravenous contrast. COMPARISON:  April 08, 2020 FINDINGS: Brain: No subdural, epidural, or subarachnoid hemorrhage. Cerebellum, brainstem, and basal cisterns are normal ventricles and sulci are stable. Mild white matter changes are identified. No acute cortical ischemia or infarct. No mass effect or midline shift. Vascular: Calcified atherosclerosis in the intracranial carotids. Skull: Normal. Negative for fracture or focal lesion. Sinuses/Orbits: No acute finding. Other: None. IMPRESSION: 1. No acute intracranial abnormalities identified. Chronic white matter changes. Electronically Signed   By: Dorise Bullion III M.D   On: 05/20/2020 15:36   CT Angio Chest PE W and/or Wo Contrast  Result Date: 05/20/2020 CLINICAL DATA:  Syncope.  Shortness of breath. EXAM: CT ANGIOGRAPHY CHEST WITH CONTRAST TECHNIQUE: Multidetector CT imaging of the chest was performed using the standard protocol during bolus administration of intravenous contrast. Multiplanar CT image reconstructions and MIPs were  obtained to evaluate the vascular anatomy. CONTRAST:  68mL OMNIPAQUE IOHEXOL 350 MG/ML SOLN COMPARISON:  04/08/2020 FINDINGS: Cardiovascular: Contrast injection is sufficient to demonstrate satisfactory opacification of the pulmonary arteries to the segmental level. There is no pulmonary embolus or evidence of right heart strain. 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 This may have worsened since the prior study but is suboptimally evaluated secondary to contrast timing. The right vertebral artery is not well visualized on this study which appears to be a new finding since April 08, 2020 the main pulmonary artery is significantly dilated measuring up to approximately 3.5 cm in diameter. There are atherosclerotic changes throughout the coronary arteries and thoracic aorta without evidence for an aneurysm. There is no significant pericardial effusion. Mediastinum/Nodes: --there is apparent interval development of an enlarged subcarinal lymph node measuring up to approximately 1.7 cm. --there is mild new right hilar adenopathy. -- No axillary lymphadenopathy. -- No supraclavicular lymphadenopathy. -- Normal thyroid gland where visualized. -  Unremarkable esophagus. Lungs/Pleura: There are moderate severe emphysematous changes bilaterally. There is interlobular septal thickening. Fiducial markers are noted in the right upper lobe. There is relatively stable nodular densities at the right lung apex. There is atelectasis at the lung bases, right worse than left. There are relatively stable areas of scarring atelectasis in the left upper lobe with adjacent fiducial markers. There is a small right-sided pleural effusion. There is a trace left-sided pleural effusion. There is no pneumothorax. There is a persistent, relatively unchanged 1 cm  spiculated nodule in the right lower lobe, suboptimally evaluated on this  study secondary to adjacent airspace disease (axial series 6, image 104). Upper Abdomen: Contrast bolus timing is not optimized for evaluation of the abdominal organs. The visualized portions of the organs of the upper abdomen are normal. There is a high-grade stenosis of the origin of the celiac axis. Musculoskeletal: There is an old compression fracture of the T3 vertebral body. No new compression fracture identified on this study. There are acute to subacute mildly displaced fractures involving the anterolateral third and fourth ribs on the left. There is nonspecific skin thickening involving the patient's left breast which appears to have progressed since the prior study. Review of the MIP images confirms the above findings. IMPRESSION: 1. No evidence for an acute pulmonary embolism 2. 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 This artery demonstrates a long segment high-grade stenosis proximally, which appears to have worsened since the prior study dated 04/08/2020. As such, no contrast is visualized in the right subclavian or right axillary arteries in addition to the right vertebral artery. These findings are not well evaluated secondary to contrast timing. Correlation with the patient's right upper extremity pulses is recommended. Additionally, a carotid artery ultrasound may be useful to detect flow within the right vertebral artery. This combination of findings may predispose the patient to a steal phenomenon. 3. Acute to subacute left-sided rib fractures as detailed above without evidence for pneumothorax. 4. Moderate severe emphysematous changes bilaterally with superimposed interstitial edema. There are small bilateral pleural effusions. 5. Cardiomegaly with findings of pulmonary artery hypertension. 6. Relatively stable nodular opacities in both lungs, somewhat limited in evaluation on today's study secondary to the presence of airspace disease and pleural effusions. 7. Apparent new  subcarinal and mild right hilar adenopathy of unknown clinical significance. A repeat examination in 3 months with contrast should be performed to confirm stability or resolution. 8. Apparent new skin thickening overlying the left breast. Correlation with physical exam is recommended. Mammography is recommended if not recently performed. Aortic Atherosclerosis (ICD10-I70.0) and Emphysema (ICD10-J43.9). Electronically Signed   By: Constance Holster M.D.   On: 05/20/2020 15:47   DG Chest Port 1 View  Result Date: 05/20/2020 CLINICAL DATA:  74 year old female with a history of shortness of breath EXAM: PORTABLE CHEST 1 VIEW COMPARISON:  04/08/2020, chest CT 04/08/2020 FINDINGS: Cardiomediastinal silhouette unchanged in size and contour. Interval development of interlobular septal thickening. Blunting of the right costophrenic angle new from the comparison. Coarsened interstitial markings. Surgical clips project over the bilateral lung apices. Calcifications of the aortic arch again noted. Left sided rib fractures again demonstrated better seen on prior CT IMPRESSION: New CHF with small right pleural effusion. Electronically Signed   By: Corrie Mckusick D.O.   On: 05/20/2020 11:49   ECHOCARDIOGRAM COMPLETE  Result Date: 05/21/2020    ECHOCARDIOGRAM REPORT   Patient Name:   Kendra Watts [facility] Date of Exam: 05/21/2020 Medical Rec #:  [id number]       Height:       60.0 in Accession #:    [id number]      Weight:       96.8 lb Date of Birth:  06-24-1946       BSA:          1.371 m Patient Age:    41 years        BP:           95/82 mmHg Patient Gender: F  HR:           100 bpm. Exam Location:  Inpatient Procedure: 2D Echo, Color Doppler and Limited Color Doppler Indications:    Cardiomegaly 429.3/ I51.7  History:        Patient has prior history of Echocardiogram examinations, most                 recent 07/06/2018. S/P breast cancer and radiation.  Sonographer:    Merrie Roof RDCS Referring Phys: [place]  1. Left ventricular ejection fraction, by estimation, is 60 to 65%. The left ventricle has normal function. The left ventricle has no regional wall motion abnormalities. Left ventricular diastolic parameters are indeterminate. Elevated left ventricular end-diastolic pressure.  2. Right ventricular systolic function is normal. The right ventricular size is normal. Tricuspid regurgitation signal is inadequate for assessing PA pressure.  3. The mitral valve is degenerative. Mild mitral valve regurgitation. No evidence of mitral stenosis.  4. The aortic valve is tricuspid. Aortic valve regurgitation is not visualized. Mild aortic valve sclerosis is present, with no evidence of aortic valve stenosis.  5. The inferior vena cava is normal in size with greater than 50% respiratory variability, suggesting right atrial pressure of 3 mmHg. FINDINGS  Left Ventricle: Left ventricular ejection fraction, by estimation, is 60 to 65%. The left ventricle has normal function. The left ventricle has no regional wall motion abnormalities. The left ventricular internal cavity size was normal in size. There is  no left ventricular hypertrophy. Left ventricular diastolic parameters are indeterminate. Elevated left ventricular end-diastolic pressure. Right Ventricle: The right ventricular size is normal. No increase in right ventricular wall thickness. Right ventricular systolic function is normal. Tricuspid regurgitation signal is inadequate for assessing PA pressure. Left Atrium: Left atrial size was normal in size. Right Atrium: Right atrial size was normal in size. Pericardium: There is no evidence of pericardial effusion. Mitral Valve: The mitral valve is degenerative in appearance. There is mild thickening of the mitral valve leaflet(s). There is mild calcification of the mitral valve leaflet(s). Mild mitral valve regurgitation. No evidence of mitral valve stenosis. Tricuspid Valve: The tricuspid valve is  normal in structure. Tricuspid valve regurgitation is trivial. No evidence of tricuspid stenosis. Aortic Valve: The aortic valve is tricuspid. Aortic valve regurgitation is not visualized. Mild aortic valve sclerosis is present, with no evidence of aortic valve stenosis. Pulmonic Valve: The pulmonic valve was normal in structure. Pulmonic valve regurgitation is not visualized. No evidence of pulmonic stenosis. Aorta: The aortic root is normal in size and structure. Venous: The inferior vena cava is normal in size with greater than 50% respiratory variability, suggesting right atrial pressure of 3 mmHg. IAS/Shunts: No atrial level shunt detected by color flow Doppler.  LEFT VENTRICLE PLAX 2D LVIDd:         4.00 cm     Diastology LVIDs:         2.70 cm     LV e' medial:    6.85 cm/s LV PW:         0.80 cm     LV E/e' medial:  19.1 LV IVS:        0.80 cm     LV e' lateral:   7.83 cm/s LVOT diam:     1.90 cm     LV E/e' lateral: 16.7 LV SV:         66 LV SV Index:   48 LVOT Area:     2.84 cm  LV Volumes (MOD) LV vol d, MOD A4C: 33.8 ml LV vol s, MOD A4C: 10.9 ml LV SV MOD A4C:     33.8 ml RIGHT VENTRICLE          IVC RV Basal diam:  3.30 cm  IVC diam: 1.90 cm LEFT ATRIUM             Index       RIGHT ATRIUM           Index LA diam:        3.20 cm 2.33 cm/m  RA Area:     11.20 cm LA Vol (A2C):   61.2 ml 44.59 ml/m RA Volume:   27.90 ml  20.35 ml/m LA Vol (A4C):   48.2 ml 35.15 ml/m LA Biplane Vol: 46.6 ml 33.98 ml/m  AORTIC VALVE LVOT Vmax:   134.00 cm/s LVOT Vmean:  82.300 cm/s LVOT VTI:    0.232 m  AORTA Ao Root diam: 2.80 cm Ao Asc diam:  2.80 cm MITRAL VALVE MV Area (PHT): 5.42 cm     SHUNTS MV Decel Time: 140 msec     Systemic VTI:  0.23 m MV E velocity: 131.00 cm/s  Systemic Diam: 1.90 cm MV A velocity: 122.00 cm/s MV E/A ratio:  1.07 Fransico Him MD Electronically signed by Fransico Him MD Signature Date/Time: 05/21/2020/1:19:16 PM    Final      Assessment & Plan:   Chronic respiratory failure with  hypoxia ([place]) Patient was hospitalized on 05/20/20 for acute on chronic respiratory failure secondary to interstitial edema and 3rd/4th left rib fracture in the setting of severe COPD. CTA was negative for PE, she had some atelectasis R>L and small bil pleural effusions. She was treated for suspected pneumonia and diuresed. She is doing well today, still at SNF but has been signed off to be discharged home next week. She remains on 4L oxygen, qualified for POC today. DME company is [employer].   Chronic obstructive pulmonary disease (HCC) Breathing is stable. No acute respiratory symptoms. Cough improved. She has not needed to use SABA. CT imaging in October 2021 showed moderate-severe emphysema. Plan continue Stiolto 2 puff once daily; prn albuterol 2 puffs q 4-6 hours as needed for breakthrough shortness of breath or wheezing. Encourage light physical activity per PT recommendations.   Multiple lung nodules Repeat CT chest in May 2021 showed pulmonary nodules were stable in size and appearance except for a 1cm nodule RLL which is slightly bigger. CTA imaging from most recent hospitalization in October 2021 showed stable, unchanged 1cm RLL nodule with new mediastinal adenopathy. She was treated for suspect pneumonia at this time so adenopathy could be reactive. Needs repeat CT chest with contrast in 3 months to monitor. If imaging shows growth then will need to discuss with Dr. Sondra Come either empiric XRT or discuss risk/benefits of diagnostic procedure.    Chronic diastolic heart failure (HCC) Appears euvolemic on exam. Continue lasix 20mg  daily. Recommend holding diuretic if BP <90/60 and contact PCP   Martyn Ehrich, NP 06/07/2020

## 2020-06-07 NOTE — Assessment & Plan Note (Addendum)
Breathing is stable. No acute respiratory symptoms. Cough improved. She has not needed to use SABA. CT imaging in October 2021 showed moderate-severe emphysema. Plan continue Stiolto 2 puff once daily; prn albuterol 2 puffs q 4-6 hours as needed for breakthrough shortness of breath or wheezing. Encourage light physical activity per PT recommendations.

## 2020-06-07 NOTE — Patient Instructions (Addendum)
COPD: - Continue Stiolto respimat- 2 puffs once daily in morning  - Use albuterol 1-2 puffs every 4-6 hours as needed for breakthrough shortness of breath  Chronic respiratory failure: - Continue to wear 4L oxygen 24/7 - We will send in a new order for POC with [employer]  Lung nodules: - We will get a repeat CT chest in 3 months to monitor right lower lobe nodule and adenopathy   Hypotension:  - Recommend you get a blood pressure cuff to monitor BP, if <90/60 please notify PCP or call ED if symptomatic   CHF: - Continue lasix 20mg  daily, hold if BP <90/60  Orders: - CT chest with contrast in 3 months re: mediastinal adenopathy, pulmonary nodules Right lung base >1cm  - New order for POC 4L with [employer]   Follow-up: - 3 months with Dr. Lamonte Sakai or sooner if needed  - Patient needs to re-schedule apt with oncology    COPD and Physical Activity Chronic obstructive pulmonary disease (COPD) is a long-term (chronic) condition that affects the lungs. COPD is a general term that can be used to describe many different lung problems that cause lung swelling (inflammation) and limit airflow, including chronic bronchitis and emphysema. The main symptom of COPD is shortness of breath, which makes it harder to do even simple tasks. This can also make it harder to exercise and be active. Talk with your health care provider about treatments to help you breathe better and actions you can take to prevent breathing problems during physical activity. What are the benefits of exercising with COPD? Exercising regularly is an important part of a healthy lifestyle. You can still exercise and do physical activities even though you have COPD. Exercise and physical activity improve your shortness of breath by increasing blood flow (circulation). This causes your heart to pump more oxygen through your body. Moderate exercise can improve your:  Oxygen use.  Energy level.  Shortness of breath.  Strength in your  breathing muscles.  Heart health.  Sleep.  Self-esteem and feelings of self-worth.  Depression, stress, and anxiety levels. Exercise can benefit everyone with COPD. The severity of your disease may affect how hard you can exercise, especially at first, but everyone can benefit. Talk with your health care provider about how much exercise is safe for you, and which activities and exercises are safe for you. What actions can I take to prevent breathing problems during physical activity?  Sign up for a pulmonary rehabilitation program. This type of program may include: ? Education about lung diseases. ? Exercise classes that teach you how to exercise and be more active while improving your breathing. This usually involves:  Exercise using your lower extremities, such as a stationary bicycle.  About 30 minutes of exercise, 2 to 5 times per week, for 6 to 12 weeks  Strength training, such as push ups or leg lifts. ? Nutrition education. ? Group classes in which you can talk with others who also have COPD and learn ways to manage stress.  If you use an oxygen tank, you should use it while you exercise. Work with your health care provider to adjust your oxygen for your physical activity. Your resting flow rate is different from your flow rate during physical activity.  While you are exercising: ? Take slow breaths. ? Pace yourself and do not try to go too fast. ? Purse your lips while breathing out. Pursing your lips is similar to a kissing or whistling position. ? If doing exercise  that uses a quick burst of effort, such as weight lifting:  Breathe in before starting the exercise.  Breathe out during the hardest part of the exercise (such as raising the weights). Where to find support You can find support for exercising with COPD from:  Your health care provider.  A pulmonary rehabilitation program.  Your local health department or community health programs.  Support groups,  online or in-person. Your health care provider may be able to recommend support groups. Where to find more information You can find more information about exercising with COPD from:  American Lung Association: ClassInsider.se.  COPD Foundation: https://www.rivera.net/. Contact a health care provider if:  Your symptoms get worse.  You have chest pain.  You have nausea.  You have a fever.  You have trouble talking or catching your breath.  You want to start a new exercise program or a new activity. Summary  COPD is a general term that can be used to describe many different lung problems that cause lung swelling (inflammation) and limit airflow. This includes chronic bronchitis and emphysema.  Exercise and physical activity improve your shortness of breath by increasing blood flow (circulation). This causes your heart to provide more oxygen to your body.  Contact your health care provider before starting any exercise program or new activity. Ask your health care provider what exercises and activities are safe for you. This information is not intended to replace advice given to you by your health care provider. Make sure you discuss any questions you have with your health care provider. Document Revised: 10/28/2018 Document Reviewed: 07/31/2017 Elsevier Patient Education  [place].   Hypotension As your heart beats, it forces blood through your body. This force is called blood pressure. If you have hypotension, you have low blood pressure. When your blood pressure is too low, you may not get enough blood to your brain or other parts of your body. This may cause you to feel weak, light-headed, have a fast heartbeat, or even pass out (faint). Low blood pressure may be harmless, or it may cause serious problems. What are the causes?  Blood loss.  Not enough water in the body (dehydration).  Heart problems.  Hormone problems.  Pregnancy.  A very bad infection.  Not having enough  of certain nutrients.  Very bad allergic reactions.  Certain medicines. What increases the risk?  Age. The risk increases as you get older.  Conditions that affect the heart or the brain and spinal cord (central nervous system).  Taking certain medicines.  Being pregnant. What are the signs or symptoms?  Feeling: ? Weak. ? Light-headed. ? Dizzy. ? Tired (fatigued).  Blurred vision.  Fast heartbeat.  Passing out, in very bad cases. How is this treated?  Changing your diet. This may involve eating more salt (sodium) or drinking more water.  Taking medicines to raise your blood pressure.  Changing how much you take (the dosage) of some of your medicines.  Wearing compression stockings. These stockings help to prevent blood clots and reduce swelling in your legs. In some cases, you may need to go to the hospital for:  Fluid replacement. This means you will receive fluids through an IV tube.  Blood replacement. This means you will receive donated blood through an IV tube (transfusion).  Treating an infection or heart problems, if this applies.  Monitoring. You may need to be monitored while medicines that you are taking wear off. Follow these instructions at home: Eating and drinking  Drink enough fluids to keep your pee (urine) pale yellow.  Eat a healthy diet. Follow instructions from your doctor about what you can eat or drink. A healthy diet includes: ? Fresh fruits and vegetables. ? Whole grains. ? Low-fat (lean) meats. ? Low-fat dairy products.  Eat extra salt only as told. Do not add extra salt to your diet unless your doctor tells you to.  Eat small meals often.  Avoid standing up quickly after you eat. Medicines  Take over-the-counter and prescription medicines only as told by your doctor. ? Follow instructions from your doctor about changing how much you take of your medicines, if this applies. ? Do not stop or change any of your medicines on  your own. General instructions   Wear compression stockings as told by your doctor.  Get up slowly from lying down or sitting.  Avoid hot showers and a lot of heat as told by your doctor.  Return to your normal activities as told by your doctor. Ask what activities are safe for you.  Do not use any products that contain nicotine or tobacco, such as cigarettes, e-cigarettes, and chewing tobacco. If you need help quitting, ask your doctor.  Keep all follow-up visits as told by your doctor. This is important. Contact a doctor if:  You throw up (vomit).  You have watery poop (diarrhea).  You have a fever for more than 2-3 days.  You feel more thirsty than normal.  You feel weak and tired. Get help right away if:  You have chest pain.  You have a fast or uneven heartbeat.  You lose feeling (have numbness) in any part of your body.  You cannot move your arms or your legs.  You have trouble talking.  You get sweaty or feel light-headed.  You pass out.  You have trouble breathing.  You have trouble staying awake.  You feel mixed up (confused). Summary  Hypotension is also called low blood pressure. It is when the force of blood pumping through your arteries is too weak.  Hypotension may be harmless, or it may cause serious problems.  Treatment may include changing your diet and medicines, and wearing compression stockings.  In very bad cases, you may need to go to the hospital. This information is not intended to replace advice given to you by your health care provider. Make sure you discuss any questions you have with your health care provider. Document Revised: 01/01/2018 Document Reviewed: 01/01/2018 Elsevier Patient Education  [place].

## 2020-06-07 NOTE — Assessment & Plan Note (Signed)
Appears euvolemic on exam. Continue lasix 20mg  daily. Recommend holding diuretic if BP <90/60 and contact PCP

## 2020-06-12 ENCOUNTER — Other Ambulatory Visit: Payer: Self-pay | Admitting: *Deleted

## 2020-06-12 DIAGNOSIS — J441 Chronic obstructive pulmonary disease with (acute) exacerbation: Secondary | ICD-10-CM

## 2020-06-12 NOTE — Patient Outreach (Addendum)
[place] Coordinator follow up. Member screened for potential [facility] Care Management needs as a benefit of [place] [employer].  Verified in Patient Kendra Watts that member transitioned to home from [facility] SNF on 06/11/20.   Telephone call made to [name] [phone]. Patient identifiers confirmed.   Explained [place] Management services. [name] agreeable. Explained [facility] Care Management will not interfere or replace services provided by home health. [name] reports [facility] will have home visit tomorrow.   [name] confirms she lives alone. States her neighbor brings her meals. However, she reports she needs assist with transportation to MD appointments. Next appointment is Dec. 6th. Agreeable to [place] Management Social Worker referral.   Also reports she is on continuous oxygen provided by [facility]. [name] states she had to call [employer] for a humidifier. Reports she has had nose bleeds from the oxygen. Also encouraged [name] to call back to request portable oxygen tanks. States she will need portable oxygen for upcoming MD appointments.   Discussed the need for a medical alert system since she is home alone. [name] states she has been considering.   Discussed Remote [facility] for home visits for additional support. [name] politely declined.   [name] has medical history of COPD, chronic respiratory failure, anemia, breast cancer, multiple lung nodules, CHF, HTN  Will make referral to [place] for complex case management and care coordination and [facility] LCSW for community resources and transportation assistance.    [name], MSN, RN,BSN [place] Acute Care Coordinator [phone] [facility]) [phone]  (Toll free office)

## 2020-06-13 DIAGNOSIS — S2232XD Fracture of one rib, left side, subsequent encounter for fracture with routine healing: Secondary | ICD-10-CM | POA: Diagnosis not present

## 2020-06-13 DIAGNOSIS — C3492 Malignant neoplasm of unspecified part of left bronchus or lung: Secondary | ICD-10-CM | POA: Diagnosis not present

## 2020-06-13 DIAGNOSIS — E039 Hypothyroidism, unspecified: Secondary | ICD-10-CM | POA: Diagnosis not present

## 2020-06-13 DIAGNOSIS — I5033 Acute on chronic diastolic (congestive) heart failure: Secondary | ICD-10-CM | POA: Diagnosis not present

## 2020-06-13 DIAGNOSIS — Z7951 Long term (current) use of inhaled steroids: Secondary | ICD-10-CM | POA: Diagnosis not present

## 2020-06-13 DIAGNOSIS — Z9181 History of falling: Secondary | ICD-10-CM | POA: Diagnosis not present

## 2020-06-13 DIAGNOSIS — I110 Hypertensive heart disease with heart failure: Secondary | ICD-10-CM | POA: Diagnosis not present

## 2020-06-13 DIAGNOSIS — J9611 Chronic respiratory failure with hypoxia: Secondary | ICD-10-CM | POA: Diagnosis not present

## 2020-06-13 DIAGNOSIS — S72115D Nondisplaced fracture of greater trochanter of left femur, subsequent encounter for closed fracture with routine healing: Secondary | ICD-10-CM | POA: Diagnosis not present

## 2020-06-13 DIAGNOSIS — C3491 Malignant neoplasm of unspecified part of right bronchus or lung: Secondary | ICD-10-CM | POA: Diagnosis not present

## 2020-06-13 DIAGNOSIS — E871 Hypo-osmolality and hyponatremia: Secondary | ICD-10-CM | POA: Diagnosis not present

## 2020-06-13 DIAGNOSIS — Z79810 Long term (current) use of selective estrogen receptor modulators (SERMs): Secondary | ICD-10-CM | POA: Diagnosis not present

## 2020-06-13 DIAGNOSIS — W19XXXD Unspecified fall, subsequent encounter: Secondary | ICD-10-CM | POA: Diagnosis not present

## 2020-06-13 DIAGNOSIS — Z9981 Dependence on supplemental oxygen: Secondary | ICD-10-CM | POA: Diagnosis not present

## 2020-06-13 DIAGNOSIS — C50412 Malignant neoplasm of upper-outer quadrant of left female breast: Secondary | ICD-10-CM | POA: Diagnosis not present

## 2020-06-13 DIAGNOSIS — J449 Chronic obstructive pulmonary disease, unspecified: Secondary | ICD-10-CM | POA: Diagnosis not present

## 2020-06-13 DIAGNOSIS — D649 Anemia, unspecified: Secondary | ICD-10-CM | POA: Diagnosis not present

## 2020-06-14 ENCOUNTER — Other Ambulatory Visit: Payer: Self-pay | Admitting: *Deleted

## 2020-06-14 ENCOUNTER — Encounter: Payer: Self-pay | Admitting: *Deleted

## 2020-06-14 NOTE — Patient Outreach (Signed)
[facility]) Care Management TH[facility] CM Telephone Outreach, Transition of Care referral- post- SNF discharge Post-SNF discharge day # 3 Unsuccessful initial outreach, new patient referral   06/14/2020  Kendra Watts 10-Oct-1945 [id number]  Unsuccessful initial outreach attempt x 3 over 30 minutes to [facility], 74 y/o female referred to [place] CM/ CSW 06/13/20 after patient experienced recent hospitalization October 30- May 23, 2020 for acute on chronic respiratory failure with hypoxia; at that time, patient was residing at [facility] for rehabilitation after she experienced a mechanical fall and incurred a femur fracture.  She was discharged from hospital back to SNF/ rehab center for ongoing rehabilitation.  Patient was subsequently discharged from [facility] center on June 11, 2020 to home/ self-care with home health services through [place].  With each call attempt today over 30 minutes, patient's preferred/ requested phone number was busy; unable to leave patient voice message requesting call back.  Plan:  Will place [facility] CM unsuccessful patient outreach letter in mail requesting call back in writing  Will re-attempt [facility]N CM telephone outreach within 4 business days if I do not hear back from patient first  Oneta Rack, RN, BSN, [employer] Coordinator [facility] Care Management  [phone]

## 2020-06-20 ENCOUNTER — Other Ambulatory Visit: Payer: Self-pay | Admitting: *Deleted

## 2020-06-20 ENCOUNTER — Encounter: Payer: Self-pay | Admitting: *Deleted

## 2020-06-20 DIAGNOSIS — J9611 Chronic respiratory failure with hypoxia: Secondary | ICD-10-CM | POA: Diagnosis not present

## 2020-06-20 DIAGNOSIS — S2232XD Fracture of one rib, left side, subsequent encounter for fracture with routine healing: Secondary | ICD-10-CM | POA: Diagnosis not present

## 2020-06-20 DIAGNOSIS — I110 Hypertensive heart disease with heart failure: Secondary | ICD-10-CM | POA: Diagnosis not present

## 2020-06-20 DIAGNOSIS — J449 Chronic obstructive pulmonary disease, unspecified: Secondary | ICD-10-CM | POA: Diagnosis not present

## 2020-06-20 DIAGNOSIS — W19XXXD Unspecified fall, subsequent encounter: Secondary | ICD-10-CM | POA: Diagnosis not present

## 2020-06-20 DIAGNOSIS — S72115D Nondisplaced fracture of greater trochanter of left femur, subsequent encounter for closed fracture with routine healing: Secondary | ICD-10-CM | POA: Diagnosis not present

## 2020-06-20 NOTE — Patient Outreach (Signed)
[facility]) Care Management  06/20/2020  Kendra Watts 02-09-1946 [id number]   CSW was able to make initial contact with patient today to perform phone assessment, as well as assess and assist with social work needs and services.  CSW introduced self, explained role and types of services provided through [place] Management ([place] Management).  CSW further explained to patient that CSW works with Kendra Watts, [place], also with [place] Management.  CSW then explained the reason for the call, indicating that [name] thought that patient would benefit from social work services and resources to assist with arranging transportation to and from patient's physician appointments.  [name] also reported that patient would like assistance obtaining a medical alert system.  CSW obtained two HIPAA compliant identifiers from patient, which included patient's name and date of birth.  After thorough review of patient's EMR (Electronic Medical Record) in Epic, Kendra Watts noted that patient currently lives at home alone, and was recently (June 11, 2020) discharged from [facility], where she received short-term rehabilitative services.  According to [name], patient declined [facility] services, but was agreeable to receiving home health services through [facility].  Patient admitted that she is able to perform all activities of daily living independently, but currently unable to drive.  Patient reported that she will definitely require transportation assistance to her upcoming appointment with Dr. Gery Watts, Radiation Oncologist at the [facility], scheduled for Monday, June 26, 2020 at 11:00AM.  CSW voiced understanding, agreeing to make transportation arrangements for patient.  CSW explained to patient that CSW will be arranging transportation for her to her appointment with Dr.  Sondra Watts through [facility], and that she will be receiving a call from a representative to explain the process and confirm a pick-up time.  Patient was agreeable to this plan, and appreciative of the assistance.  CSW also agreed to e-mail ([email or web address]) patient, per her request, a list of transportation resources.  The transportation resources will include all of the following:  Road to Recovery through the [employer], Armed forces technical officer through [employer], an Land for [employer] (Engineering geologist), and Actuary for Older Adults in [place], [place].  CSW will also e-mail patient a list of resources for seniors, a list of [facility] assistance programs, and information about medical alert systems.   Patient reported that her sister, Kendra Watts, as well as her nephew, Kendra Watts are very supportive of her, but neither of them live locally or able to offer any significant assistance.  Patient denied the need for prepared meal assistance, indicating that her neighbor routinely provides her with home-cooked meals.  Patient was not interested in receiving assistance with Advanced Directives ([place] documents) completion, stating that she has already made her wishes known.  Patient denied having any additional social work needs at present.  Patient was agreeable to having CSW follow-up with her again next week, on Thursday, June 29, 2020, around 9:00AM, to ensure that she received the list of resources via e-mail, answer any questions that she may have pertaining to the information received, and assist with the referral process, if necessary.   [place] Patient Intake Request   Patients Name Kendra Watts Patients Phone Number [phone]  Patient DOB October 28, 1945 Patient MRN   [id number]    Patient Home  Address  [address], [place], [place], [place] [address] Patient Insurance  MEDICARE/MEDICARE PART A AND B      Pick Up Ride Information   Date of Request  Tuesday, June 20, 2020 Pick Up Date June 26, 2020  Pick up Location [address], [place], [place], [place] [address] Pick Up Time  10:15AM  Does pt. use cane, walker, transfer wheelchair?  [place] Is patient traveling alone?                   Yes  Dropoff Location  [place] [address] [place],  [place]  [address] Appointment Time 11:00AM  Should driver wait for pt. for return trip?   *This should only be used for patients with appts. Over 1 hour* Yes, please remain with the patient for the duration of the appointment.  Return [place] up [place] [address] [place],  [place]  [address] Pick up Time Directly after appointment.  Does pt. use cane, walker, transfer wheelchair?  [place] Is patient traveling alone? Yes  Dropoff/Return Location  [address], [place], [place], [place] [address]  Intake Logistics   Department  [facility] Care Management Cost Center  [phone]  Ride Scheduled by: (TC Name) Nat Christen, LCSW Pt. Referred By Nat Christen, LCSW    Nat Christen, BSW, MSW, [place]  Licensed Clinical Social Worker  [place]  Mailing [place]. [address], [place],  [address] Physical Address-[address] [place],  [address] Toll Free Main # [phone] Fax # [phone] Cell # [phone]  [email or web address]

## 2020-06-21 ENCOUNTER — Ambulatory Visit: Payer: Medicare Other | Admitting: *Deleted

## 2020-06-21 ENCOUNTER — Telehealth: Payer: Self-pay | Admitting: *Deleted

## 2020-06-21 DIAGNOSIS — W19XXXD Unspecified fall, subsequent encounter: Secondary | ICD-10-CM | POA: Diagnosis not present

## 2020-06-21 DIAGNOSIS — S72115D Nondisplaced fracture of greater trochanter of left femur, subsequent encounter for closed fracture with routine healing: Secondary | ICD-10-CM | POA: Diagnosis not present

## 2020-06-21 DIAGNOSIS — I110 Hypertensive heart disease with heart failure: Secondary | ICD-10-CM | POA: Diagnosis not present

## 2020-06-21 DIAGNOSIS — J449 Chronic obstructive pulmonary disease, unspecified: Secondary | ICD-10-CM | POA: Diagnosis not present

## 2020-06-21 DIAGNOSIS — J9611 Chronic respiratory failure with hypoxia: Secondary | ICD-10-CM | POA: Diagnosis not present

## 2020-06-21 DIAGNOSIS — S2232XD Fracture of one rib, left side, subsequent encounter for fracture with routine healing: Secondary | ICD-10-CM | POA: Diagnosis not present

## 2020-06-21 NOTE — Telephone Encounter (Signed)
CALLED PATIENT TO INFORM OF CT FOR 06-27-20 AND HER FU ON 06-29-20, SPOKE WITH PATIENT AND SHE HAS DECIDED TO HOLD OFF ON SCANS AND FU, SHE WANTS TO CALL ME IN 2-3- WEEKS AND RESCHEDULE SCAN AND FU

## 2020-06-22 ENCOUNTER — Other Ambulatory Visit: Payer: Self-pay | Admitting: *Deleted

## 2020-06-22 DIAGNOSIS — S72115D Nondisplaced fracture of greater trochanter of left femur, subsequent encounter for closed fracture with routine healing: Secondary | ICD-10-CM | POA: Diagnosis not present

## 2020-06-22 DIAGNOSIS — J9611 Chronic respiratory failure with hypoxia: Secondary | ICD-10-CM | POA: Diagnosis not present

## 2020-06-22 DIAGNOSIS — I110 Hypertensive heart disease with heart failure: Secondary | ICD-10-CM | POA: Diagnosis not present

## 2020-06-22 DIAGNOSIS — W19XXXD Unspecified fall, subsequent encounter: Secondary | ICD-10-CM | POA: Diagnosis not present

## 2020-06-22 DIAGNOSIS — J449 Chronic obstructive pulmonary disease, unspecified: Secondary | ICD-10-CM | POA: Diagnosis not present

## 2020-06-22 DIAGNOSIS — S2232XD Fracture of one rib, left side, subsequent encounter for fracture with routine healing: Secondary | ICD-10-CM | POA: Diagnosis not present

## 2020-06-22 NOTE — Patient Outreach (Signed)
[facility]) Care Management THN CM Telephone Outreach, Transition of Care, new referral Post-SNF discharge day # 11  06/22/2020  [name] 28-Nov-1945 [id number]  Successful second outreach attempt to Kendra Watts, 74 y/o female referred to [place] CM/ [place] 06/13/20 after patient experienced recent hospitalization October 30- May 23, 2020 for acute on chronic respiratory failure with hypoxia; at that time, patient was residing at [facility] for rehabilitation after she experienced a mechanical fall and incurred a femur fracture.  She was discharged from hospital back to SNF/ rehab center for ongoing rehabilitation.  Patient was subsequently discharged from [facility] center on June 11, 2020 to home/ self-care with home health services through [place].  With successful call attempt today, patient provides HIPAA identifier for name only; she immediately states that she is busy, "right in the middle of something important," and she requests call back next week, "on Wednesday or Thursday;" explained basic purpose of call to patient and agreed to contact her next week, as she requests.   Plan:  Verified THN CM unsuccessful patient outreach letter in mail requesting call back in writing on 06/14/20  Will re-attempt THN CM telephone outreach within 4 business days if I do not hear back from patient first  Oneta Rack, RN, BSN, [employer] Coordinator [facility] Care Management  [phone]

## 2020-06-26 ENCOUNTER — Ambulatory Visit: Payer: Self-pay | Admitting: Radiation Oncology

## 2020-06-27 ENCOUNTER — Other Ambulatory Visit (HOSPITAL_COMMUNITY): Payer: Medicare Other

## 2020-06-27 DIAGNOSIS — J9611 Chronic respiratory failure with hypoxia: Secondary | ICD-10-CM | POA: Diagnosis not present

## 2020-06-27 DIAGNOSIS — J449 Chronic obstructive pulmonary disease, unspecified: Secondary | ICD-10-CM | POA: Diagnosis not present

## 2020-06-27 DIAGNOSIS — I110 Hypertensive heart disease with heart failure: Secondary | ICD-10-CM | POA: Diagnosis not present

## 2020-06-27 DIAGNOSIS — S2232XD Fracture of one rib, left side, subsequent encounter for fracture with routine healing: Secondary | ICD-10-CM | POA: Diagnosis not present

## 2020-06-27 DIAGNOSIS — S72115D Nondisplaced fracture of greater trochanter of left femur, subsequent encounter for closed fracture with routine healing: Secondary | ICD-10-CM | POA: Diagnosis not present

## 2020-06-27 DIAGNOSIS — W19XXXD Unspecified fall, subsequent encounter: Secondary | ICD-10-CM | POA: Diagnosis not present

## 2020-06-28 ENCOUNTER — Encounter: Payer: Self-pay | Admitting: *Deleted

## 2020-06-28 ENCOUNTER — Other Ambulatory Visit: Payer: Self-pay | Admitting: *Deleted

## 2020-06-28 DIAGNOSIS — W19XXXD Unspecified fall, subsequent encounter: Secondary | ICD-10-CM | POA: Diagnosis not present

## 2020-06-28 DIAGNOSIS — I110 Hypertensive heart disease with heart failure: Secondary | ICD-10-CM | POA: Diagnosis not present

## 2020-06-28 DIAGNOSIS — S72115D Nondisplaced fracture of greater trochanter of left femur, subsequent encounter for closed fracture with routine healing: Secondary | ICD-10-CM | POA: Diagnosis not present

## 2020-06-28 DIAGNOSIS — S2232XD Fracture of one rib, left side, subsequent encounter for fracture with routine healing: Secondary | ICD-10-CM | POA: Diagnosis not present

## 2020-06-28 DIAGNOSIS — J9611 Chronic respiratory failure with hypoxia: Secondary | ICD-10-CM | POA: Diagnosis not present

## 2020-06-28 DIAGNOSIS — J449 Chronic obstructive pulmonary disease, unspecified: Secondary | ICD-10-CM | POA: Diagnosis not present

## 2020-06-28 NOTE — Patient Outreach (Signed)
[facility]) Care Management THN CM Telephone Outreach, Transition of Care outreach attempt Post- SNF discharge day # 17 Unsuccessful (consecutive) outreach attempt # 1- new referral 06/28/2020  Tylar Merendino [facility] 10/21/1945 [id number]  Unsuccessful outreachattempt to Mare Ferrari, 74 y/o female referred to [place] CM/ [place] 06/13/20 after patient experienced recent hospitalization October 30- May 23, 2020 for acute on chronic respiratory failure with hypoxia; at that time, patient was residing at [facility] for rehabilitation after she experienced a mechanical fall and incurred a femur fracture. She was discharged from hospital back to SNF/ rehab center for ongoing rehabilitation. Patient was subsequently discharged from [facility] center on June 11, 2020 to home/ self-care with home health services through [place].  Upon successfully connecting with patient on 06/22/20, she requested call-back then on "either Wednesday or Thursday" of this week, as she was unable to speak to me to complete call on 06/22/20.    HIPAA compliant voice mail message left for patient today, requesting return call back.  Plan:  Verified THN CM unsuccessful patient outreach letter in mail requesting call back in writing on 06/14/20  Will re-attempt THN CM telephone outreach within 4 business days if I do not hear back from patient first  Oneta Rack, RN, BSN, [employer] Coordinator [facility] Care Management  [phone]

## 2020-06-29 ENCOUNTER — Other Ambulatory Visit: Payer: Self-pay | Admitting: *Deleted

## 2020-06-29 ENCOUNTER — Ambulatory Visit: Payer: Medicare Other | Admitting: Radiation Oncology

## 2020-06-29 ENCOUNTER — Telehealth: Payer: Self-pay | Admitting: Internal Medicine

## 2020-06-29 ENCOUNTER — Encounter: Payer: Self-pay | Admitting: *Deleted

## 2020-06-29 NOTE — Telephone Encounter (Signed)
   Kendra Watts DOB: 1946/01/14 MRN: [id number]   RIDER WAIVER AND RELEASE OF LIABILITY  For purposes of improving physical access to our facilities, [facility] is pleased to partner with third parties to provide [facility] patients or other authorized individuals the option of convenient, on-demand ground transportation services (the [place]") through use of the technology service that enables users to request on-demand ground transportation from independent third-party providers.  By opting to use and accept these [employer], I, the undersigned, hereby agree on behalf of myself, and on behalf of any minor child using the [employer] for whom I am the parent or legal guardian, as follows:  1. Government social research officer provided to me are provided by independent third-party transportation providers who are not [employer] or employees and who are unaffiliated with [employer]. 2. [place] is neither a transportation carrier nor a common or public carrier. 3. [place] has no control over the quality or safety of the transportation that occurs as a result of the [employer]. 4. [place] cannot guarantee that any third-party transportation provider will complete any arranged transportation service. 5. [facility] makes no representation, warranty, or guarantee regarding the reliability, timeliness, quality, safety, suitability, or availability of any of the Transport Services or that they will be error free. 6. I fully understand that traveling by vehicle involves risks and dangers of serious bodily injury, including permanent disability, paralysis, and death. I agree, on behalf of myself and on behalf of any minor child using the Transport Services for whom I am the parent or legal guardian, that the entire risk arising out of my use of the [employer] remains solely with me, to the maximum extent permitted under applicable law. 7. The [employer] are provided "as is" and "as available." [place] disclaims all representations and warranties, express, implied or statutory, not expressly set out in these terms, including the implied warranties of merchantability and fitness for a particular purpose. 8. I hereby waive and release [place], its agents, employees, officers, directors, representatives, insurers, attorneys, assigns, successors, subsidiaries, and affiliates from any and all past, present, or future claims, demands, liabilities, actions, causes of action, or suits of any kind directly or indirectly arising from acceptance and use of the [employer]. 9. I further waive and release [place] and its affiliates from all present and future liability and responsibility for any injury or death to persons or damages to property caused by or related to the use of the [employer]. 10. I have read this Waiver and Release of Liability, and I understand the terms used in it and their legal significance. This Waiver is freely and voluntarily given with the understanding that my right (as well as the right of any minor child for whom I am the parent or legal guardian using the [employer]) to legal recourse against [place] in connection with the [employer] is knowingly surrendered in return for use of these services.   I attest that I read the consent document to Kendra Watts, gave Ms. Royer the opportunity to ask questions and answered the questions asked (if any). I affirm that Kendra Watts then provided consent for she's participation in this program.     Darrick Meigs [place]

## 2020-06-29 NOTE — Patient Outreach (Signed)
[facility]) Care Management  06/29/2020  Kendra Watts 1946/03/04 [id number]   CSW was able to make contact with patient today to follow-up regarding social work services and resources, as well as to ensure that patient received the resource information that CSW e-mailed to her last week, per her request.  Patient confirmed receipt, appreciative of all the information regarding the various medical alert systems, as well as all the different transportation options provided to her.  Patient denied the need for CSW to assist her with purchasing a medical alert system.  CSW reminded patient that she can also utilize [employer], providing her with the contact information again, just to confirm.  CSW explained to patient that CSW has already gotten patient established in their database, so all she will need to do is contact the number, preferably at least 72 hours in advance, and transportation will be arranged.  CSW will perform a case closure on patient, as all goals of treatment have been met from social work standpoint and no additional social work needs have been identified at this time.  CSW will notify patient's RNCM with [facility], Reginia Naas of CSW's plans to close patient's case, as well as route this note to her.  CSW will fax an update to patient's Primary Care Physician, Dr. Merrilee Seashore to ensure that they are aware of CSW's involvement with patient's plan of care, in addition to routine Dr. Ashby Dawes a Physician Case Closure Letter.  CSW was able to confirm that patient has the correct contact information for CSW, encouraging her to contact CSW directly if additional social work needs arise in the near future, or if patient requires assistance with arranging transportation services.   Nat Christen, BSW, MSW, LCSW  Licensed Education officer, [facility]   [place] [address] [place], [place] [address] Physical Address-[address] [place], [place] [address] Toll Free Main # [phone] Fax # [phone] Cell # [phone]  [email or web address]

## 2020-06-30 ENCOUNTER — Telehealth: Payer: Self-pay | Admitting: *Deleted

## 2020-06-30 DIAGNOSIS — J449 Chronic obstructive pulmonary disease, unspecified: Secondary | ICD-10-CM | POA: Diagnosis not present

## 2020-06-30 DIAGNOSIS — S2232XD Fracture of one rib, left side, subsequent encounter for fracture with routine healing: Secondary | ICD-10-CM | POA: Diagnosis not present

## 2020-06-30 DIAGNOSIS — I110 Hypertensive heart disease with heart failure: Secondary | ICD-10-CM | POA: Diagnosis not present

## 2020-06-30 DIAGNOSIS — J9611 Chronic respiratory failure with hypoxia: Secondary | ICD-10-CM | POA: Diagnosis not present

## 2020-06-30 DIAGNOSIS — W19XXXD Unspecified fall, subsequent encounter: Secondary | ICD-10-CM | POA: Diagnosis not present

## 2020-06-30 DIAGNOSIS — S72115D Nondisplaced fracture of greater trochanter of left femur, subsequent encounter for closed fracture with routine healing: Secondary | ICD-10-CM | POA: Diagnosis not present

## 2020-06-30 NOTE — Telephone Encounter (Signed)
RETURNED PATIENT'S PHONE CALL, SPOKE WITH PATIENT. ?

## 2020-07-03 ENCOUNTER — Other Ambulatory Visit: Payer: Self-pay | Admitting: *Deleted

## 2020-07-03 ENCOUNTER — Encounter: Payer: Self-pay | Admitting: *Deleted

## 2020-07-03 NOTE — Patient Outreach (Signed)
[facility]) Care Management [facility] CM Telephone Outreach, Transition of Care outreach Case closure- patient declined ongoing [place]- SNF discharge day # 22  07/03/2020  Kendra Watts [facility] 1946/04/28 [id number]  Successful outreachattempt to Kendra Watts, 74 y/o female referred to [facility] CM/ CSW 06/13/20 after patient experienced recent hospitalization October 30- May 23, 2020 for acute on chronic respiratory failure with hypoxia; at that time, patient was residing at [facility] for rehabilitation after she experienced a mechanical fall and incurred a femur fracture. She was discharged from hospital back to SNF/ rehab center for ongoing rehabilitation. Patient was subsequently discharged from [facility] on June 11, 2020 to home/ self-care with home health services through [facility].  HIPAA/ identity verified and purpose of call/ [facility] CM services discussed with patient, who recalls speaking with me briefly last week; confirms that she had [facility] and was provided resources for transportation.  Confirms "doing really good," and that home health services remain active and both PT and RN are visiting.  Feels as if she is making good progress with home health services.  Reports independent in ADL's and iADL's; continues using home O2 at 2-4 L/min; reports has appointment with [employer] [facility] to explore qualification for light-weight portable O2 tanks.  Hoping to resume driving self again in the future as she continues to recuperate.  Independently manages medications at home and declines medication review today, stating that no changes have been made and home health RN has reviewed.  Patient verbalizes no ongoing care coordination/ disease management/ pharmacy/ community resource needs today and states she would like to think about need for ongoing participation in [place] program, politely declining services today, as she believes the home  health services are enough and that she is making good progress in her recuperation; agreed to place letter in mail to patient for her consideration, and encouraged patient to contact me or [facility] CM team should she change her mind and wish to participate.    Plan:  Will make patient inactive with [facility] CM program, as she verbalizes no ongoing care coordination/ disease management/ pharmacy/ community resource needs and declines ongoing participation in [place] program; will make patient's PCP aware of same  Oneta Rack, RN, BSN, [place] Coordinator [facility] Care Management  [phone]

## 2020-07-04 ENCOUNTER — Telehealth: Payer: Self-pay | Admitting: Emergency Medicine

## 2020-07-04 DIAGNOSIS — S72115D Nondisplaced fracture of greater trochanter of left femur, subsequent encounter for closed fracture with routine healing: Secondary | ICD-10-CM | POA: Diagnosis not present

## 2020-07-04 DIAGNOSIS — S2232XD Fracture of one rib, left side, subsequent encounter for fracture with routine healing: Secondary | ICD-10-CM | POA: Diagnosis not present

## 2020-07-04 DIAGNOSIS — J449 Chronic obstructive pulmonary disease, unspecified: Secondary | ICD-10-CM | POA: Diagnosis not present

## 2020-07-04 DIAGNOSIS — J9611 Chronic respiratory failure with hypoxia: Secondary | ICD-10-CM | POA: Diagnosis not present

## 2020-07-04 DIAGNOSIS — W19XXXD Unspecified fall, subsequent encounter: Secondary | ICD-10-CM | POA: Diagnosis not present

## 2020-07-04 DIAGNOSIS — I110 Hypertensive heart disease with heart failure: Secondary | ICD-10-CM | POA: Diagnosis not present

## 2020-07-04 NOTE — Telephone Encounter (Signed)
Attempted to call [name] with [facility] PT but unable to reach. Left message for her to return call.

## 2020-07-05 DIAGNOSIS — I110 Hypertensive heart disease with heart failure: Secondary | ICD-10-CM | POA: Diagnosis not present

## 2020-07-05 DIAGNOSIS — S2232XD Fracture of one rib, left side, subsequent encounter for fracture with routine healing: Secondary | ICD-10-CM | POA: Diagnosis not present

## 2020-07-05 DIAGNOSIS — J9611 Chronic respiratory failure with hypoxia: Secondary | ICD-10-CM | POA: Diagnosis not present

## 2020-07-05 DIAGNOSIS — J449 Chronic obstructive pulmonary disease, unspecified: Secondary | ICD-10-CM | POA: Diagnosis not present

## 2020-07-05 DIAGNOSIS — S72115D Nondisplaced fracture of greater trochanter of left femur, subsequent encounter for closed fracture with routine healing: Secondary | ICD-10-CM | POA: Diagnosis not present

## 2020-07-05 DIAGNOSIS — W19XXXD Unspecified fall, subsequent encounter: Secondary | ICD-10-CM | POA: Diagnosis not present

## 2020-07-06 DIAGNOSIS — J449 Chronic obstructive pulmonary disease, unspecified: Secondary | ICD-10-CM | POA: Diagnosis not present

## 2020-07-06 DIAGNOSIS — S72115D Nondisplaced fracture of greater trochanter of left femur, subsequent encounter for closed fracture with routine healing: Secondary | ICD-10-CM | POA: Diagnosis not present

## 2020-07-06 DIAGNOSIS — I110 Hypertensive heart disease with heart failure: Secondary | ICD-10-CM | POA: Diagnosis not present

## 2020-07-06 DIAGNOSIS — S2232XD Fracture of one rib, left side, subsequent encounter for fracture with routine healing: Secondary | ICD-10-CM | POA: Diagnosis not present

## 2020-07-06 DIAGNOSIS — J9611 Chronic respiratory failure with hypoxia: Secondary | ICD-10-CM | POA: Diagnosis not present

## 2020-07-06 DIAGNOSIS — W19XXXD Unspecified fall, subsequent encounter: Secondary | ICD-10-CM | POA: Diagnosis not present

## 2020-07-06 NOTE — Telephone Encounter (Signed)
Kendra Watts is returning phone call. Kendra Watts phone number is [phone]. May leave detailed message on voicemail.

## 2020-07-06 NOTE — Telephone Encounter (Signed)
Called and spoke to Kendra Watts, [name]. She states she was told by the pt that she is to use 4lpm at rest and 2lpm while walking. [name] is questioning what the pt stated and wants clarification of what the true order is. [name] that pt was walked in 05/2020 and pt needed 4lpm on a pulsed device. [name] states the pt has been using her concentrator and has only needed 2lpm. [name] states she will send updates of pt's O2 needs and activities and will keep pt's spo2 at 90% or above and keep [place] updated on the liter flow. [name] states the pt is doing well with her activities. Nothing further needed at this time.

## 2020-07-10 DIAGNOSIS — W19XXXD Unspecified fall, subsequent encounter: Secondary | ICD-10-CM | POA: Diagnosis not present

## 2020-07-10 DIAGNOSIS — J9611 Chronic respiratory failure with hypoxia: Secondary | ICD-10-CM | POA: Diagnosis not present

## 2020-07-10 DIAGNOSIS — J449 Chronic obstructive pulmonary disease, unspecified: Secondary | ICD-10-CM | POA: Diagnosis not present

## 2020-07-10 DIAGNOSIS — I110 Hypertensive heart disease with heart failure: Secondary | ICD-10-CM | POA: Diagnosis not present

## 2020-07-10 DIAGNOSIS — S72115D Nondisplaced fracture of greater trochanter of left femur, subsequent encounter for closed fracture with routine healing: Secondary | ICD-10-CM | POA: Diagnosis not present

## 2020-07-10 DIAGNOSIS — S2232XD Fracture of one rib, left side, subsequent encounter for fracture with routine healing: Secondary | ICD-10-CM | POA: Diagnosis not present

## 2020-07-13 ENCOUNTER — Other Ambulatory Visit: Payer: Self-pay

## 2020-07-13 ENCOUNTER — Ambulatory Visit (HOSPITAL_COMMUNITY)
Admission: RE | Admit: 2020-07-13 | Discharge: 2020-07-13 | Disposition: A | Discharge status: Home / Self Care | Payer: Medicare Other | Source: Ambulatory Visit | Attending: Radiation Oncology | Admitting: Radiation Oncology

## 2020-07-13 DIAGNOSIS — J439 Emphysema, unspecified: Secondary | ICD-10-CM | POA: Diagnosis not present

## 2020-07-13 DIAGNOSIS — C50412 Malignant neoplasm of upper-outer quadrant of left female breast: Secondary | ICD-10-CM | POA: Diagnosis not present

## 2020-07-13 DIAGNOSIS — Z9981 Dependence on supplemental oxygen: Secondary | ICD-10-CM | POA: Diagnosis not present

## 2020-07-13 DIAGNOSIS — Z7951 Long term (current) use of inhaled steroids: Secondary | ICD-10-CM | POA: Diagnosis not present

## 2020-07-13 DIAGNOSIS — C3412 Malignant neoplasm of upper lobe, left bronchus or lung: Secondary | ICD-10-CM | POA: Insufficient documentation

## 2020-07-13 DIAGNOSIS — E039 Hypothyroidism, unspecified: Secondary | ICD-10-CM | POA: Diagnosis not present

## 2020-07-13 DIAGNOSIS — S72115D Nondisplaced fracture of greater trochanter of left femur, subsequent encounter for closed fracture with routine healing: Secondary | ICD-10-CM | POA: Diagnosis not present

## 2020-07-13 DIAGNOSIS — I5033 Acute on chronic diastolic (congestive) heart failure: Secondary | ICD-10-CM | POA: Diagnosis not present

## 2020-07-13 DIAGNOSIS — S2231XA Fracture of one rib, right side, initial encounter for closed fracture: Secondary | ICD-10-CM | POA: Diagnosis not present

## 2020-07-13 DIAGNOSIS — Z9181 History of falling: Secondary | ICD-10-CM | POA: Diagnosis not present

## 2020-07-13 DIAGNOSIS — I110 Hypertensive heart disease with heart failure: Secondary | ICD-10-CM | POA: Diagnosis not present

## 2020-07-13 DIAGNOSIS — E871 Hypo-osmolality and hyponatremia: Secondary | ICD-10-CM | POA: Diagnosis not present

## 2020-07-13 DIAGNOSIS — J449 Chronic obstructive pulmonary disease, unspecified: Secondary | ICD-10-CM | POA: Diagnosis not present

## 2020-07-13 DIAGNOSIS — J9611 Chronic respiratory failure with hypoxia: Secondary | ICD-10-CM | POA: Diagnosis not present

## 2020-07-13 DIAGNOSIS — C3492 Malignant neoplasm of unspecified part of left bronchus or lung: Secondary | ICD-10-CM | POA: Diagnosis not present

## 2020-07-13 DIAGNOSIS — I2510 Atherosclerotic heart disease of native coronary artery without angina pectoris: Secondary | ICD-10-CM | POA: Diagnosis not present

## 2020-07-13 DIAGNOSIS — S2232XD Fracture of one rib, left side, subsequent encounter for fracture with routine healing: Secondary | ICD-10-CM | POA: Diagnosis not present

## 2020-07-13 DIAGNOSIS — Z79810 Long term (current) use of selective estrogen receptor modulators (SERMs): Secondary | ICD-10-CM | POA: Diagnosis not present

## 2020-07-13 DIAGNOSIS — C3491 Malignant neoplasm of unspecified part of right bronchus or lung: Secondary | ICD-10-CM | POA: Diagnosis not present

## 2020-07-13 DIAGNOSIS — D649 Anemia, unspecified: Secondary | ICD-10-CM | POA: Diagnosis not present

## 2020-07-13 DIAGNOSIS — W19XXXD Unspecified fall, subsequent encounter: Secondary | ICD-10-CM | POA: Diagnosis not present

## 2020-07-13 DIAGNOSIS — S2243XA Multiple fractures of ribs, bilateral, initial encounter for closed fracture: Secondary | ICD-10-CM | POA: Diagnosis not present

## 2020-07-18 DIAGNOSIS — J9611 Chronic respiratory failure with hypoxia: Secondary | ICD-10-CM | POA: Diagnosis not present

## 2020-07-18 DIAGNOSIS — I110 Hypertensive heart disease with heart failure: Secondary | ICD-10-CM | POA: Diagnosis not present

## 2020-07-18 DIAGNOSIS — J449 Chronic obstructive pulmonary disease, unspecified: Secondary | ICD-10-CM | POA: Diagnosis not present

## 2020-07-18 DIAGNOSIS — W19XXXD Unspecified fall, subsequent encounter: Secondary | ICD-10-CM | POA: Diagnosis not present

## 2020-07-18 DIAGNOSIS — S72115D Nondisplaced fracture of greater trochanter of left femur, subsequent encounter for closed fracture with routine healing: Secondary | ICD-10-CM | POA: Diagnosis not present

## 2020-07-18 DIAGNOSIS — S2232XD Fracture of one rib, left side, subsequent encounter for fracture with routine healing: Secondary | ICD-10-CM | POA: Diagnosis not present

## 2020-07-19 DIAGNOSIS — W19XXXD Unspecified fall, subsequent encounter: Secondary | ICD-10-CM | POA: Diagnosis not present

## 2020-07-19 DIAGNOSIS — S2232XD Fracture of one rib, left side, subsequent encounter for fracture with routine healing: Secondary | ICD-10-CM | POA: Diagnosis not present

## 2020-07-19 DIAGNOSIS — S72115D Nondisplaced fracture of greater trochanter of left femur, subsequent encounter for closed fracture with routine healing: Secondary | ICD-10-CM | POA: Diagnosis not present

## 2020-07-19 DIAGNOSIS — I110 Hypertensive heart disease with heart failure: Secondary | ICD-10-CM | POA: Diagnosis not present

## 2020-07-19 DIAGNOSIS — J449 Chronic obstructive pulmonary disease, unspecified: Secondary | ICD-10-CM | POA: Diagnosis not present

## 2020-07-19 DIAGNOSIS — J9611 Chronic respiratory failure with hypoxia: Secondary | ICD-10-CM | POA: Diagnosis not present

## 2020-07-23 NOTE — Progress Notes (Incomplete)
Radiation Oncology         [phone] ________________________________  Name: Kendra Watts MRN: [id number]  Date: 07/24/2020  DOB: 1946/03/07  Follow-Up Visit Note  CC: Merrilee Seashore, MD  Nicholas Lose, MD  No diagnosis found.  Diagnosis:    1. 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 Stage IA (pT1c, pN0) left breast invasive ductal carcinoma, ER+ Ysidro Evert /Her2-  Interval Since Last Radiation: One year, eight months, and three weeks  1. 10/27/2018, 10/29/2018, 11/02/2018: Left and Right Lung targets were each treated to 54 Gy in 3 fractions 2. 10/30/2015 - 11/28/2015: Left Breast with Boost for a total of 52.72 Gy in 21 fractions  Narrative: The patient returns today for routine follow-up. Since her last visit, she has been seen in the ED on several occasions. Visits are as follows: 1. 03/03/2020 for epistaxis. Dual Rapid Rhino balloons placed. 2. 03/07/2020 for epistaxis. Original balloons were removed and new balloons were placed. 3. 03/13/2020 for epistaxis, weakness, and shortness of breath. She was admitted to the hospital for epistaxis, weakness, hyponatremia, and acute anemia; stabilized and discharged home on 03/18/2020. 4. 04/08/2020 for evaluation status post fall. CT scans of head and cervical spine did not show any acute intracranial abnormality, acute fracture, or static subluxation of the cervical spine. Pelvic x-ray showed a comminuted left greater trochanteric fracture with minimal displacement of the tip of the greater trochanter. There was no definite extension through the femoral neck but evaluation was limited secondary to osteopenia. Chest x-ray showed mildly displaced left third and fourth rib fractures without pneumothorax. CT scan of chest/abdomen/pelvis showed an acute lateral third left rib fracture  with a stable lateral fourth left rib fracture, an acute fracture of the greater trochanter of the proximal left femur, stable non-calcified lung nodules within the bilateral lungs, stable posterior left upper lobe scarring and/or atelectasis, a 6 mm pleural-based non-calcified left lower lobe lung nodule that represented a new finding when compared to prior exam, moderate severity emphysematous lung disease, mild pleural thickening along the posterior aspect of the left lung base that was new, and a chronic compression fracture deformity involving the T3 vertebral body with interval decrease in vertebral body height since prior study. The patient was admitted to the hospital and then discharged to [facility] on 04/12/2020. 5. 05/20/2020 for shortness of breath and syncope. Chest x-ray at that time showed new CHF with a small right pleural effusion. CT scan of head showed no acute intracranial abnormalities. CTA of chest did not show any evidence of an acute PE. 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 Additionally, there were noted to be acute to subacute left-sided rib fractures without evidence of pneumothorax, moderate to severe emphysematous changes bilaterally with superimposed interstitial edema, small bilateral pleural effusions, cardiomegaly with findings of pulmonary artery hypertension, relatively stable nodular opacities in both lungs, apparent new subcarinal and mild right hilar adenopathy of unknown clinical significance, and apparent new skin thickening overlying the left breast. She was admitted to the hospital for acute on chronic respiratory failure with hypoxia, stabilized, and discharged to a SNF on 05/23/2020.4  Most recent chest CT scan on 07/13/2020 showed no significant change in appearance of the bilateral pulmonary nodules. There were no new  suspicious lung nodules  identified. The asymmetric skin thickening involving the left breast was similar in appearance and most likely reflects post treatment changes. T3 compression fracture was unchanged and bilateral rib fractures were stable.  On review of systems, she reports ***. She denies ***.  ALLERGIES:  has No Known Allergies.  Meds: Current Outpatient Medications  Medication Sig Dispense Refill  . acetaminophen (TYLENOL) 325 MG tablet Take 325 mg by mouth every 6 (six) hours as needed for mild pain or moderate pain.    Marland Kitchen albuterol (VENTOLIN HFA) 108 (90 Base) MCG/ACT inhaler Inhale 2 puffs into the lungs every 6 (six) hours as needed for wheezing or shortness of breath. 8 g 3  . aspirin 81 MG tablet Take 1 tablet (81 mg total) by mouth daily.    . Calcium-Magnesium-Vitamin D (CALCIUM 1200+D3 PO) Take 1 tablet by mouth daily.    . ferrous sulfate 325 (65 FE) MG tablet Take 1 tablet (325 mg total) by mouth 2 (two) times daily with a meal.  0  . furosemide (LASIX) 20 MG tablet Take 1 tablet (20 mg total) by mouth daily. 30 tablet 0  . furosemide (LASIX) 20 MG tablet Take 20 mg by mouth.    . ibandronate (BONIVA) 150 MG tablet Take 150 mg by mouth every 30 (thirty) days.    Marland Kitchen levothyroxine (SYNTHROID) 50 MCG tablet Take 50 mcg by mouth daily.    . Multiple Vitamins-Minerals (MULTIVITAMIN WITH MINERALS) tablet Take 1 tablet by mouth daily.     . OXYGEN Inhale 2-4 L into the lungs as needed. As needed to maintain SATS > 90%    . pantoprazole (PROTONIX) 40 MG tablet Take 1 tablet (40 mg total) by mouth daily. 30 tablet 0  . rosuvastatin (CRESTOR) 10 MG tablet Take 10 mg by mouth daily.     . tamoxifen (NOLVADEX) 20 MG tablet TAKE 1 TABLET BY MOUTH EVERY DAY (Patient taking differently: Take 20 mg by mouth daily. ) 90 tablet 0  . Tiotropium Bromide-Olodaterol (STIOLTO RESPIMAT) 2.5-2.5 MCG/ACT AERS Inhale 2 puffs into the lungs daily. 4 g 12   No current facility-administered medications for this encounter.     Physical Findings: The patient is in no acute distress. Patient is alert and oriented.  vitals were not taken for this visit.   No significant changes. Lungs are clear to auscultation bilaterally. Heart has regular rate and rhythm. No palpable cervical, supraclavicular, or axillary adenopathy. Abdomen soft, non-tender, normal bowel sounds. ***   Lab Findings: Lab Results  Component Value Date   WBC 9.5 05/22/2020   HGB 8.1 (L) 05/22/2020   HCT 25.4 (L) 05/22/2020   MCV 86.4 05/22/2020   PLT 411 (H) 05/22/2020    Radiographic Findings: CT Chest Wo Contrast  Result Date: 07/13/2020 CLINICAL DATA:  Non-small cell lung cancer.  Restaging. EXAM: CT CHEST WITHOUT CONTRAST TECHNIQUE: Multidetector CT imaging of the chest was performed following the standard protocol without IV contrast. COMPARISON:  04/08/2020 FINDINGS: Cardiovascular: Heart size appears mildly enlarged. Increase caliber of the main pulmonary artery is again noted suggestive of PA hypertension. Aortic atherosclerosis. Coronary artery calcifications. Mediastinum/Nodes: No enlarged axillary, supraclavicular, mediastinal, or hilar lymph nodes. The trachea appears patent and is midline. Normal appearance of the esophagus. The thyroid gland is either atrophic or surgically absent. Lungs/Pleura: No pleural effusion.  Advanced changes of emphysema. Scattered nodular densities are again noted. -index lesion within the medial right apex measures 1.1 x 0.8 cm, image 23/5. Previously 1.2  x 0.7 cm. -Peripheral nodule in the posterior left lower lobe measures 2 mm, image 99/5. Unchanged. -resolution of previous subpleural nodule in the posterior left lower lobe. Irregular nodular density within the lateral right lower lobe appears unchanged measuring 0.6 by 1.1 cm, image 102/5. Irregular nodular density within the posterior left upper lobe measures 1.6 x 0.8 cm, image 40/5. Unchanged. Upper Abdomen: No acute abnormality. Extensive aortic  atherosclerosis with branch vessel disease is noted. Musculoskeletal: Fractures involving the lateral aspect of the left third and fourth ribs are unchanged. Right anterolateral rib fractures are also identified which appear chronic. There is a compression fracture involving the T3 vertebra with loss of 80% of the anterior vertebral body height. Degenerative disc disease is identified at L2-3. there is asymmetric skin thickening involving the left breast which likely reflects post treatment changes. IMPRESSION: 1. No significant change in the appearance of bilateral pulmonary nodules. No new suspicious lung nodules identified. 2. Stable appearance of bilateral pulmonary nodules. 3. Emphysema and aortic atherosclerosis. Coronary artery calcifications noted. 4. T3 compression fracture, unchanged. 5. Bilateral rib fractures, stable. 6. Similar appearance of asymmetric skin thickening involving the left breast which likely reflects post treatment changes. Aortic Atherosclerosis (ICD10-I70.0) and Emphysema (ICD10-J43.9). Electronically Signed   By: Kerby Moors M.D.   On: 07/13/2020 14:07    Impression: Synchronous bilateral stage 1 lung cancers  Recent chest CT scan shows stability of bilateral pulmonary nodules. ***  Plan: The patient is scheduled to follow up with [name] on 08/01/2020. She will follow up with radiation oncology in *** months.  Total time spent in this encounter was *** minutes which included reviewing the patient's most recent x-rays, CT scans, ED visits, hospital admissions, consultations, follow-ups, physical examination, and documentation.  ____________________________________   Blair Promise, PhD, MD  This document serves as a record of services personally performed by [name], MD. It was created on his behalf by Clerance Lav, a trained medical scribe. The creation of this record is based on the scribe's personal observations and the provider's statements to them. This  document has been checked and approved by the attending provider.

## 2020-07-24 ENCOUNTER — Ambulatory Visit
Admission: RE | Admit: 2020-07-24 | Discharge: 2020-07-24 | Disposition: A | Discharge status: Home / Self Care | Payer: Medicare Other | Source: Ambulatory Visit | Attending: Radiation Oncology | Admitting: Radiation Oncology

## 2020-07-25 ENCOUNTER — Other Ambulatory Visit: Payer: Self-pay | Admitting: Radiation Oncology

## 2020-07-25 ENCOUNTER — Telehealth: Payer: Self-pay

## 2020-07-25 DIAGNOSIS — J449 Chronic obstructive pulmonary disease, unspecified: Secondary | ICD-10-CM | POA: Diagnosis not present

## 2020-07-25 DIAGNOSIS — C3491 Malignant neoplasm of unspecified part of right bronchus or lung: Secondary | ICD-10-CM

## 2020-07-25 DIAGNOSIS — S72115D Nondisplaced fracture of greater trochanter of left femur, subsequent encounter for closed fracture with routine healing: Secondary | ICD-10-CM | POA: Diagnosis not present

## 2020-07-25 DIAGNOSIS — S2232XD Fracture of one rib, left side, subsequent encounter for fracture with routine healing: Secondary | ICD-10-CM | POA: Diagnosis not present

## 2020-07-25 DIAGNOSIS — W19XXXD Unspecified fall, subsequent encounter: Secondary | ICD-10-CM | POA: Diagnosis not present

## 2020-07-25 DIAGNOSIS — J9611 Chronic respiratory failure with hypoxia: Secondary | ICD-10-CM | POA: Diagnosis not present

## 2020-07-25 DIAGNOSIS — I110 Hypertensive heart disease with heart failure: Secondary | ICD-10-CM | POA: Diagnosis not present

## 2020-07-25 NOTE — Telephone Encounter (Signed)
Patient called and advised Dr. Sondra Come will call her today re: her CT and Enid Derry our secretary will contact her to provide her an appointment with Dr. Sondra Come in February. Patient verbalized understanding.

## 2020-07-26 ENCOUNTER — Telehealth: Payer: Self-pay | Admitting: *Deleted

## 2020-07-26 NOTE — Telephone Encounter (Signed)
CALLED PATIENT TO INFORM OF FU APPT. ON 08-01-20 @ 3:30 PM WITH DR. KINARD, SPOKE WITH PATIENT AND SHE IS AWARE OF THIS APPT.

## 2020-07-27 ENCOUNTER — Telehealth: Payer: Self-pay | Admitting: *Deleted

## 2020-07-27 DIAGNOSIS — W19XXXD Unspecified fall, subsequent encounter: Secondary | ICD-10-CM | POA: Diagnosis not present

## 2020-07-27 DIAGNOSIS — S72115D Nondisplaced fracture of greater trochanter of left femur, subsequent encounter for closed fracture with routine healing: Secondary | ICD-10-CM | POA: Diagnosis not present

## 2020-07-27 DIAGNOSIS — I110 Hypertensive heart disease with heart failure: Secondary | ICD-10-CM | POA: Diagnosis not present

## 2020-07-27 DIAGNOSIS — S2232XD Fracture of one rib, left side, subsequent encounter for fracture with routine healing: Secondary | ICD-10-CM | POA: Diagnosis not present

## 2020-07-27 DIAGNOSIS — J9611 Chronic respiratory failure with hypoxia: Secondary | ICD-10-CM | POA: Diagnosis not present

## 2020-07-27 DIAGNOSIS — J449 Chronic obstructive pulmonary disease, unspecified: Secondary | ICD-10-CM | POA: Diagnosis not present

## 2020-07-27 NOTE — Telephone Encounter (Signed)
CALLED PATIENT TO INFORM THAT SHE DOESN'T HAVE TO COME FOR FU ON 08-01-20, SHE WILL HAVE A CT SCAN IN EARLY July AND THEN A FU APPT. WITH DR. KINARD, PATIENT VERIFIED UNDERSTANDING THIS

## 2020-07-27 NOTE — Telephone Encounter (Signed)
CALLED PATIENT TO INFORM

## 2020-07-31 NOTE — Progress Notes (Signed)
Patient Care Team: Merrilee Seashore, MD as PCP - General (Internal Medicine) Lorretta Harp, MD as Attending Physician (Cardiology) Milus Banister, MD (Gastroenterology) Molli Posey, MD as Consulting Physician (Obstetrics and Gynecology) Sylvan Cheese, NP as Nurse Practitioner (Hematology and Oncology)  DIAGNOSIS:    ICD-10-CM   1. Squamous cell lung cancer, left (HCC)  C34.92     SUMMARY OF ONCOLOGIC HISTORY: Oncology History  Breast cancer of upper-outer quadrant of left female breast ([place])  08/30/2015 Imaging   DEXA scan: Normal    09/07/2015 Initial Diagnosis   Screening detected left breast mass at 1:00 position 4 x 3 x 4 mm biopsy grade 1 invasive ductal carcinoma ER 100% PR 90% positive HER-2 negative ratio 1.18 Ki-67 10%, T1 aN0 stage IA clinical stage   09/22/2015 Surgery   Left lumpectomy [name]): IDC grade 1, 1.1 minutes, ADH, LCIS, margins negative, 0/3 lymph nodes, ER 100%, PR 90%, HER-2 negative, Ki-67 10%, T1 cN0 stage IA, Oncotype DX score 14, 9% ROR   10/30/2015 - 11/28/2015 Radiation Therapy   Adjuvant XRT ([facility]). Left breast: 42.72 Gy in 16 treatments.  Left breast "boost": 10 Gy in 5 treatments.  Total: 52.72 Gy in 21 treatments    11/2015 -  Anti-estrogen oral therapy   Tamoxifen $RemoveBe'20mg'[id number]$  daily. Planned duration of treatment: 5 years ([facility])   Squamous cell lung cancer, left ([place])  09/10/2018 Initial Diagnosis   Squamous cell lung cancer, left ([place])   09/10/2018 Cancer Staging   Staging form: Lung, AJCC 8th Edition - Clinical stage from 09/10/2018: cT1, cN0, cM0 - Signed by Nicholas Lose, MD on 09/10/2018   10/27/2018 - 11/02/2018 Radiation Therapy   Stereotactic radiosurgery   Adenocarcinoma, lung, right ([place])  09/02/2018 Initial Diagnosis   Lung nodule evaluation of her former smoker with shortness of breath during hospitalization, PET CT, right upper lobe nodule 1.2 x 1.1 cm SUV 7.6 biopsy revealed adenocarcinoma positive for TTF-1 and  Napsin A, left upper lobe nodule 2.5 x 2.1 cm SUV 9.8, no lymph node hypermetabolism, 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 and TTF-1 is negative   09/10/2018 Cancer Staging   Staging form: Lung, AJCC 8th Edition - Clinical stage from 09/10/2018: cT1, cN0, cM0 - Signed by Nicholas Lose, MD on 09/10/2018   10/27/2018 - 11/02/2018 Radiation Therapy   Stereotactic radiosurgery     CHIEF COMPLIANT: Follow-up of breast and lung cancers  INTERVAL HISTORY: Kendra Watts is a 75 y.o. with above-mentioned history of breast cancer and bilateral lung cancer. Mammogram on 10/28/19 showed no evidence of malignancy. Chest CT on 07/13/20 showed stable bilateral pulmonary nodules and no new evidence of malignancy. She presents to the clinic today for follow-up.  Last year she had extensive problems including nosebleeds for which she was in [place] recuperating.  As she was ready to go home she tripped on the oxygen and tubing and fell and broke her hip.  She spent another 2 to 3 months and just left around Thanksgiving time to her home.  Since then she has been doing much better.  Because of the anemia during hospitalization she received blood transfusion.  She tells me that she is doing much better.  She had a CT scan of the chest to do staging evaluation for lung cancer and there was no evidence of recurrent lung cancer.  Lung nodules appear to be stable.  She is now using oxygen 24/7.  ALLERGIES:  has No Known Allergies.  MEDICATIONS:  Current Outpatient Medications  Medication Sig Dispense Refill  . acetaminophen (TYLENOL) 325 MG tablet Take 325 mg by mouth every 6 (six) hours as needed for mild pain or moderate pain.    Marland Kitchen albuterol (VENTOLIN HFA) 108 (90 Base) MCG/ACT inhaler Inhale 2 puffs into the lungs every 6 (six) hours as needed for wheezing or shortness of breath. 8 g 3  . aspirin 81 MG tablet Take 1 tablet (81 mg total) by mouth daily.    . Calcium-Magnesium-Vitamin D  (CALCIUM 1200+D3 PO) Take 1 tablet by mouth daily.    . ferrous sulfate 325 (65 FE) MG tablet Take 1 tablet (325 mg total) by mouth 2 (two) times daily with a meal.  0  . furosemide (LASIX) 20 MG tablet Take 1 tablet (20 mg total) by mouth daily. 30 tablet 0  . furosemide (LASIX) 20 MG tablet Take 20 mg by mouth.    . ibandronate (BONIVA) 150 MG tablet Take 150 mg by mouth every 30 (thirty) days.    Marland Kitchen levothyroxine (SYNTHROID) 50 MCG tablet Take 50 mcg by mouth daily.    . Multiple Vitamins-Minerals (MULTIVITAMIN WITH MINERALS) tablet Take 1 tablet by mouth daily.     . OXYGEN Inhale 2-4 L into the lungs as needed. As needed to maintain SATS > 90%    . pantoprazole (PROTONIX) 40 MG tablet Take 1 tablet (40 mg total) by mouth daily. 30 tablet 0  . rosuvastatin (CRESTOR) 10 MG tablet Take 10 mg by mouth daily.     . tamoxifen (NOLVADEX) 20 MG tablet TAKE 1 TABLET BY MOUTH EVERY DAY (Patient taking differently: Take 20 mg by mouth daily. ) 90 tablet 0  . Tiotropium Bromide-Olodaterol (STIOLTO RESPIMAT) 2.5-2.5 MCG/ACT AERS Inhale 2 puffs into the lungs daily. 4 g 12   No current facility-administered medications for this visit.    PHYSICAL EXAMINATION: ECOG PERFORMANCE STATUS: 2 - Symptomatic, <50% confined to bed  Vitals:   08/01/20 1418  BP: 117/64  Pulse: 71  Resp: 16  Temp: 98.1 F (36.7 C)  SpO2: 97%   Filed Weights   08/01/20 1418  Weight: 93 lb 1.6 oz (42.2 kg)    BREAST: Profound left breast lymphedema (exam performed in the presence of a chaperone)  LABORATORY DATA:  I have reviewed the data as listed CMP Latest Ref Rng & Units 05/23/2020 05/22/2020 05/22/2020  Glucose 70 - 99 mg/dL 97 112(H) 117(H)  BUN 8 - 23 mg/dL 7(L) 9 9  Creatinine 0.44 - 1.00 mg/dL 0.70 0.70 0.74  Sodium 135 - 145 mmol/L 139 141 140  Potassium 3.5 - 5.1 mmol/L 4.0 3.5 3.1(L)  Chloride 98 - 111 mmol/L 91(L) 91(L) 91(L)  CO2 22 - 32 mmol/L 40(H) 40(H) 37(H)  Calcium 8.9 - 10.3 mg/dL 8.5(L) 8.6(L)  8.1(L)  Total Protein 6.5 - 8.1 g/dL - - -  Total Bilirubin 0.3 - 1.2 mg/dL - - -  Alkaline Phos 38 - 126 U/L - - -  AST 15 - 41 U/L - - -  ALT 0 - 44 U/L - - -    Lab Results  Component Value Date   WBC 9.5 05/22/2020   HGB 8.1 (L) 05/22/2020   HCT 25.4 (L) 05/22/2020   MCV 86.4 05/22/2020   PLT 411 (H) 05/22/2020   NEUTROABS 10.1 (H) 05/20/2020    ASSESSMENT & PLAN:  Squamous cell lung cancer, left ([place]) 09/02/2018: Lung nodule evaluation of her former smoker with shortness of breath during  hospitalization,  PET CT,  rightupper lobe nodule 1.2 x 1.1 cm SUV 7.6 biopsy revealed adenocarcinoma positive for TTF-1 and Napsin A, left upper lobe nodule 2.5 x 2.1 cm SUV 9.8, no lymph node hypermetabolism, 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 and TTF-1 is negative  Stereotactic radiosurgery April 2020   07/13/2020: CT chest: No significant change in the bilateral pulmonary nodules.  Stable.  Emphysema, T3 compression fracture, bilateral rib fractures stable  2021 she had nosebleeds as well as left hip fracture and required blood transfusion and nursing home recovery  I ordered another CT scan completed 1 year follow-up after that.   No orders of the defined types were placed in this encounter.  The patient has a good understanding of the overall plan. she agrees with it. she will call with any problems that may develop before the next visit here.  Total time spent: 20 mins including face to face time and time spent for planning, charting and coordination of care  Nicholas Lose, MD 08/01/2020  I, Cloyde Reams Dorshimer, am acting as scribe for Dr. Nicholas Lose.  I have reviewed the above documentation for accuracy and completeness, and I agree with the above.

## 2020-08-01 ENCOUNTER — Other Ambulatory Visit: Payer: Self-pay

## 2020-08-01 ENCOUNTER — Inpatient Hospital Stay: Payer: Medicare Other | Attending: Hematology and Oncology | Admitting: Hematology and Oncology

## 2020-08-01 ENCOUNTER — Ambulatory Visit: Payer: Self-pay | Admitting: Radiation Oncology

## 2020-08-01 DIAGNOSIS — C3490 Malignant neoplasm of unspecified part of unspecified bronchus or lung: Secondary | ICD-10-CM

## 2020-08-01 DIAGNOSIS — Z79899 Other long term (current) drug therapy: Secondary | ICD-10-CM | POA: Insufficient documentation

## 2020-08-01 DIAGNOSIS — Z87891 Personal history of nicotine dependence: Secondary | ICD-10-CM | POA: Insufficient documentation

## 2020-08-01 DIAGNOSIS — Z79810 Long term (current) use of selective estrogen receptor modulators (SERMs): Secondary | ICD-10-CM | POA: Diagnosis not present

## 2020-08-01 DIAGNOSIS — Z170 Estrogen receptor positive status [ER+]: Secondary | ICD-10-CM | POA: Insufficient documentation

## 2020-08-01 DIAGNOSIS — C3492 Malignant neoplasm of unspecified part of left bronchus or lung: Secondary | ICD-10-CM | POA: Diagnosis not present

## 2020-08-01 DIAGNOSIS — Z7982 Long term (current) use of aspirin: Secondary | ICD-10-CM | POA: Diagnosis not present

## 2020-08-01 DIAGNOSIS — J439 Emphysema, unspecified: Secondary | ICD-10-CM | POA: Diagnosis not present

## 2020-08-01 DIAGNOSIS — C50412 Malignant neoplasm of upper-outer quadrant of left female breast: Secondary | ICD-10-CM | POA: Diagnosis not present

## 2020-08-01 DIAGNOSIS — D649 Anemia, unspecified: Secondary | ICD-10-CM | POA: Insufficient documentation

## 2020-08-01 DIAGNOSIS — Z923 Personal history of irradiation: Secondary | ICD-10-CM | POA: Insufficient documentation

## 2020-08-01 NOTE — Assessment & Plan Note (Signed)
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, left upper lobe nodule 2.5 x 2.1 cm SUV 9.8, no lymph node hypermetabolism, 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 and TTF-1 is negative  Stereotactic radiosurgery April 2020   07/13/2020: CT chest: No significant change in the bilateral pulmonary nodules.  Stable.  Emphysema, T3 compression fracture, bilateral rib fractures stable

## 2020-08-03 DIAGNOSIS — S2232XD Fracture of one rib, left side, subsequent encounter for fracture with routine healing: Secondary | ICD-10-CM | POA: Diagnosis not present

## 2020-08-03 DIAGNOSIS — W19XXXD Unspecified fall, subsequent encounter: Secondary | ICD-10-CM | POA: Diagnosis not present

## 2020-08-03 DIAGNOSIS — J449 Chronic obstructive pulmonary disease, unspecified: Secondary | ICD-10-CM | POA: Diagnosis not present

## 2020-08-03 DIAGNOSIS — S72115D Nondisplaced fracture of greater trochanter of left femur, subsequent encounter for closed fracture with routine healing: Secondary | ICD-10-CM | POA: Diagnosis not present

## 2020-08-03 DIAGNOSIS — I110 Hypertensive heart disease with heart failure: Secondary | ICD-10-CM | POA: Diagnosis not present

## 2020-08-03 DIAGNOSIS — J9611 Chronic respiratory failure with hypoxia: Secondary | ICD-10-CM | POA: Diagnosis not present

## 2020-08-04 DIAGNOSIS — J449 Chronic obstructive pulmonary disease, unspecified: Secondary | ICD-10-CM | POA: Diagnosis not present

## 2020-08-04 DIAGNOSIS — S72115D Nondisplaced fracture of greater trochanter of left femur, subsequent encounter for closed fracture with routine healing: Secondary | ICD-10-CM | POA: Diagnosis not present

## 2020-08-04 DIAGNOSIS — I110 Hypertensive heart disease with heart failure: Secondary | ICD-10-CM | POA: Diagnosis not present

## 2020-08-04 DIAGNOSIS — J9611 Chronic respiratory failure with hypoxia: Secondary | ICD-10-CM | POA: Diagnosis not present

## 2020-08-04 DIAGNOSIS — W19XXXD Unspecified fall, subsequent encounter: Secondary | ICD-10-CM | POA: Diagnosis not present

## 2020-08-04 DIAGNOSIS — S2232XD Fracture of one rib, left side, subsequent encounter for fracture with routine healing: Secondary | ICD-10-CM | POA: Diagnosis not present

## 2020-08-25 ENCOUNTER — Telehealth: Payer: Self-pay | Admitting: Emergency Medicine

## 2020-08-25 NOTE — Telephone Encounter (Signed)
RB please advise if you are ok to fill out the handicap placard for the pt.  thanks

## 2020-08-28 NOTE — Telephone Encounter (Signed)
Called and spoke with patient to let her know that Dr. Lamonte Sakai has filled out handicap form for her and asked if she wanted it mailed or if she wanted to pick it up this week. She asked to have it mailed. I have placed it up front to be mailed to patient. Nothing further needed at this time.

## 2020-08-28 NOTE — Telephone Encounter (Signed)
Yes I agree. 

## 2020-09-15 ENCOUNTER — Ambulatory Visit (HOSPITAL_COMMUNITY): Payer: Medicare Other

## 2020-09-19 DIAGNOSIS — N390 Urinary tract infection, site not specified: Secondary | ICD-10-CM | POA: Diagnosis not present

## 2020-09-21 ENCOUNTER — Other Ambulatory Visit: Payer: Self-pay | Admitting: *Deleted

## 2020-09-21 MED ORDER — TAMOXIFEN CITRATE 20 MG PO TABS
20.0000 mg | ORAL_TABLET | Freq: Every day | ORAL | 3 refills | Status: DC
Start: 2020-09-21 — End: 2021-08-20

## 2020-10-10 ENCOUNTER — Ambulatory Visit: Payer: Medicare Other | Admitting: Emergency Medicine

## 2020-10-12 ENCOUNTER — Telehealth: Payer: Self-pay | Admitting: Cardiovascular Disease

## 2020-10-12 NOTE — Telephone Encounter (Signed)
Spoke to patient she stated she has been having burning pain in both legs and both feet cold for the past 2 months.Stated she has been at a rehab facility for physical therapy and is now back home.Appointment scheduled with Coletta Memos NP 10/30/20 at 2:15 pm.

## 2020-10-12 NOTE — Telephone Encounter (Signed)
Patient states she has been experiencing a burning pain in both of her legs.  She states her legs have felt that way for a while, but she states the pain has gradually become more severe. She states her feet have also been cold so she is concerned that she may be having issues with blood flow. Please return call to discuss further.

## 2020-10-20 ENCOUNTER — Other Ambulatory Visit (HOSPITAL_COMMUNITY): Payer: Self-pay | Admitting: Cardiovascular Disease

## 2020-10-20 DIAGNOSIS — Z9889 Other specified postprocedural states: Secondary | ICD-10-CM

## 2020-10-20 DIAGNOSIS — I701 Atherosclerosis of renal artery: Secondary | ICD-10-CM

## 2020-10-27 NOTE — Progress Notes (Signed)
Cardiology Clinic Note   Patient Name: Kendra Watts Date of Encounter: 10/30/2020  Primary Care Provider:  Merrilee Seashore, MD Primary Cardiologist:  Kendra Burow, MD  Patient Profile    Kendra Watts 75 year old female presents to the clinic today for an evaluation of her lower extremity pain.  Past Medical History    Past Medical History:  Diagnosis Date  . Anemia   . 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 Breast cancer ([place]) 09/22/2015   Malignant  . Breast cancer of upper-outer quadrant of left female breast ([place]) 09/08/2015  . Cataract, immature   . CHF (congestive heart failure) ([place])    Acute CHF-06/2018  . COPD (chronic obstructive pulmonary disease) ([place])   . Femur fracture, left ([place]) 04/11/2020   left femur fracture( greater trochanter  . Heart murmur    states no known problems  . History of hyperthyroidism   . Hyperlipidemia   . Hypertension    states under control with meds., has been on med. x "long time"  . Hypokalemia    from last physical.   . Hypothyroidism   . Nonfunctioning kidney    left  . Personal history of radiation therapy    2017  . Pulmonary nodules    Bilateral  . Radiation 10/30/15-11/28/15   left breast 42.72 Gy, boosted to 10 Gy  . Renal artery stenosis ([place])   . Tobacco abuse   . Wears partial dentures    upper and lower   Past Surgical History:  Procedure Laterality Date  . ABDOMINAL ANGIOGRAM  02/18/2012   Procedure: ABDOMINAL ANGIOGRAM;  Surgeon: Kendra Harp, MD;  Location: [facility] CATH LAB;  Service: Cardiovascular;;  . ABDOMINAL AORTAGRAM  07/04/2014  . ABDOMINAL HYSTERECTOMY  ~ 1977   partial  . APPENDECTOMY    . ARCH AORTOGRAM    . BREAST LUMPECTOMY Left 09/22/2015   Malignant  . CAROTID ANGIOGRAM N/A 02/18/2012   Procedure: CAROTID ANGIOGRAM;  Surgeon: Kendra Harp, MD;  Location: [facility] CATH LAB;  Service: Cardiovascular;  Laterality: N/A;  . ENDARTERECTOMY  04/02/2012   Procedure: ENDARTERECTOMY  CAROTID;  Surgeon: Kendra Mitchell, MD;  Location: [facility] OR;  Service: Vascular;  Laterality: Right;  . FUDUCIAL PLACEMENT Bilateral 09/02/2018   Procedure: Placement Of Fiducial to right upper lobe & left upper lobe lung;  Surgeon: Kendra Gobble, MD;  Location: [facility] OR;  Service: Thoracic;  Laterality: Bilateral;  . IR THORACENTESIS ASP PLEURAL SPACE W/IMG GUIDE  07/08/2018  . RADIOACTIVE SEED GUIDED PARTIAL MASTECTOMY WITH AXILLARY SENTINEL LYMPH NODE BIOPSY Left 09/22/2015   Procedure: INJECT BLUE DYE LEFT BREAST,RADIOACTIVE SEED GUIDED PARTIAL MASTECTOMY WITH AXILLARY SENTINEL LYMPH NODE BIOPSY;  Surgeon: Kendra Skates, MD;  Location: [place];  Service: General;  Laterality: Left;  . RENAL ANGIOGRAM Left 06/08/2010   renal artery stent -  5x12 Genesis on Aviator balloon stent (Dr. Adora Watts)  . RENAL ANGIOGRAM Right 07/04/2014   Procedure: RENAL ANGIOGRAM;  Surgeon: Kendra Harp, MD;  Location: [facility] CATH LAB;  Service: Cardiovascular;  Laterality: Right;  . RENAL ANGIOGRAM Right 08/22/2014   Procedure: RENAL ANGIOGRAM;  Surgeon: Kendra Harp, MD;  Location: [facility] CATH LAB;  Service: Cardiovascular;  Laterality: Right;  . TONSILLECTOMY     as a child  . VIDEO BRONCHOSCOPY WITH ENDOBRONCHIAL NAVIGATION N/A 09/02/2018   Procedure: VIDEO BRONCHOSCOPY WITH ENDOBRONCHIAL NAVIGATION;  Surgeon: Kendra Gobble, MD;  Location: [place];  Service:  Thoracic;  Laterality: N/A;    Allergies  No Known Allergies  History of Present Illness    [name] has a PMH of carotid artery stenosis, renal artery stenosis, PAD, HTN, HLD, and tobacco abuse.  She is retired from [place] [employer].  Her mother had an MI at 33 and died at age 78.  She underwent angiography which showed right internal carotid artery stenosis as well as right subclavian artery stenosis.  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  She underwent stenting to her left renal artery.  She was noted to  have moderate bilateral iliac disease but denied claudication.  She underwent right carotid endarterectomy by Dr. Trula Watts 9/13.  Her carotid Dopplers 2015 showed widely patent right carotid endarterectomy and minimal left carotid disease.  Her renal Dopplers showed progression of right renal disease.  Her left kidney continue to diminish in size despite patent stent.  Her renal Dopplers in April showed progression of disease in her right as well.  Her lower extremity atrial Dopplers showed progression of disease in iliac arteries and she again denied claudication.  She denied chest pain and shortness of breath.  There was some concern for ischemic neuropathy with a relay aortic ratio of about 6.  She had ductal breast carcinoma with lumpectomy by Dr. Dalbert Watts 09/22/2015.  She had subsequent radiation therapy.  She did not require chemotherapy.  She was seen by Dr. Gwenlyn Watts 04/27/2019.  During that time she reported she had been diagnosed with lung cancer and had radiation therapy.  She was admitted to the hospital 07/06/2018 with respiratory failure related to COPD/diastolic dysfunction.  She received diuresis.  She was also Watts to be anemic and received transfusion.  She continued to complain of chronic shortness of breath as well as lower extremity claudication.  She continued to smoke.  Her ABIs 05/17/2019 showed mild right lower extremity arterial disease and mild left lower extremity disease.  Follow-up study in 12 months was recommended.  Her carotid ultrasound 05/17/2019 showed 1-39% right carotid stenosis, and 1-39% left carotid stenosis, right subclavian stenotic artery.  Follow-up study in 12 months was recommended.  She contacted the nurse triage line on 10/12/2020 and reported bilateral lower extremity pain.  She presents the clinic today for follow-up evaluation states she has noticed periodic extended episodes of burning leg pain.  She reports that there is no pattern to it but she will have it  throughout the day and dizziness for extended periods of time.  She occasionally notes episodes of cramping.  We reviewed her prior ABIs.  She reports that she was fairly physically active after she came home from [facility] and had home physical therapy 2 times per week for quite some time.  However she is currently not doing any formal physical activity.  I have asked her to resume her physical therapy exercises 2 days/week, continue to follow a heart healthy low-sodium diet, will order lower extremity ABIs, LEAs, carotid Dopplers, and have her follow-up with Dr. Gwenlyn Watts after her test.  Today she denies chest pain, shortness of breath, lower extremity edema, fatigue, palpitations, melena, hematuria, hemoptysis, diaphoresis, weakness, presyncope, syncope, orthopnea, and PND.    Home Medications    Prior to Admission medications   Medication Sig Start Date End Date Taking? Authorizing Provider  albuterol (VENTOLIN HFA) 108 (90 Base) MCG/ACT inhaler Inhale 2 puffs into the lungs every 6 (six) hours as needed for wheezing or shortness of breath. 02/02/19  Kendra Gobble, MD  aspirin 81 MG tablet Take 1 tablet (81 mg total) by mouth daily. 03/21/20   Hongalgi, Lenis Dickinson, MD  Calcium-Magnesium-Vitamin D (CALCIUM 1200+D3 PO) Take 1 tablet by mouth daily.    [provider]  furosemide (LASIX) 20 MG tablet Take 1 tablet (20 mg total) by mouth daily. 07/13/18 05/20/20  Kayleen Memos, DO  furosemide (LASIX) 20 MG tablet Take 20 mg by mouth.    [provider]  ibandronate (BONIVA) 150 MG tablet Take 150 mg by mouth every 30 (thirty) days. 01/19/20   [provider]  levothyroxine (SYNTHROID) 50 MCG tablet Take 50 mcg by mouth daily. 12/08/19   [provider]  Multiple Vitamins-Minerals (MULTIVITAMIN WITH MINERALS) tablet Take 1 tablet by mouth daily.     [provider]  OXYGEN Inhale 2-4 L into the lungs as needed. As needed to maintain SATS > 90%     [provider]  pantoprazole (PROTONIX) 40 MG tablet Take 1 tablet (40 mg total) by mouth daily. 07/14/18   Kayleen Memos, DO  rosuvastatin (CRESTOR) 10 MG tablet Take 10 mg by mouth daily.     [provider]  tamoxifen (NOLVADEX) 20 MG tablet Take 1 tablet (20 mg total) by mouth daily. 09/21/20   Nicholas Lose, MD  Tiotropium Bromide-Olodaterol (STIOLTO RESPIMAT) 2.5-2.5 MCG/ACT AERS Inhale 2 puffs into the lungs daily. 10/27/19   Kendra Gobble, MD    Family History    Family History  Problem Relation Age of Onset  . Heart disease Mother        MI @ 91, died at 103  . Cancer Father   . Lung cancer Father   . Heart disease Maternal Grandmother   . Lung cancer Sister   . Breast cancer Paternal Aunt    She indicated that her mother is deceased. She indicated that her father is deceased. She indicated that the status of her sister is unknown. She indicated that the status of her maternal grandmother is unknown. She indicated that the status of her paternal aunt is unknown.  Social History    Social History   Socioeconomic History  . Marital status: Divorced    Spouse name: Not on file  . Number of children: 0  . Years of education: College  . Highest education level: Bachelor's degree (e.g., BA, AB, BS)  Occupational History  . Occupation: HR Technical brewer: [employer]  Tobacco Use  . Smoking status: Current Every Day Smoker    Packs/day: 0.50    Years: 40.00    Pack years: 20.00    Types: Cigarettes  . Smokeless tobacco: Never Used  . Tobacco comment: last smoked 3 months ago since going to rehab  Vaping Use  . Vaping Use: Never used  Substance and Sexual Activity  . Alcohol use: Yes    Comment: occasionally  . Drug use: No  . Sexual activity: Never  Other Topics Concern  . Not on file  Social History Narrative  . Not on file   Social Determinants of Health   Financial Resource Strain: Low Risk   . Difficulty of Paying Living  Expenses: Not hard at all  Food Insecurity: No Food Insecurity  . Worried About Charity fundraiser in the Last Year: Never true  . Ran Out of Food in the Last Year: Never true  Transportation Needs: No Transportation Needs  . Lack of Transportation (Medical): No  .  Lack of Transportation (Non-Medical): No  Physical Activity: Insufficiently Active  . Days of Exercise per Week: 2 days  . Minutes of Exercise per Session: 50 min  Stress: No Stress Concern Present  . Feeling of Stress : Only a little  Social Connections: Moderately Isolated  . Frequency of Communication with Friends and Family: More than three times a week  . Frequency of Social Gatherings with Friends and Family: More than three times a week  . Attends Religious Services: 1 to 4 times per year  . Active Member of Clubs or Organizations: No  . Attends Archivist Meetings: Never  . Marital Status: Divorced  Human resources officer Violence: Not At Risk  . Fear of Current or Ex-Partner: No  . Emotionally Abused: No  . Physically Abused: No  . Sexually Abused: No     Review of Systems    General:  No chills, fever, night sweats or weight changes.  Cardiovascular:  No chest pain, dyspnea on exertion, edema, orthopnea, palpitations, paroxysmal nocturnal dyspnea. Dermatological: No rash, lesions/masses Respiratory: No cough, dyspnea Urologic: No hematuria, dysuria Abdominal:   No nausea, vomiting, diarrhea, bright red blood per rectum, melena, or hematemesis Neurologic:  No visual changes, wkns, changes in mental status. All other systems reviewed and are otherwise negative except as noted above.  Physical Exam    VS:  BP 102/65   Pulse 68   Ht 5' (1.524 m)   Wt 97 lb 11.2 oz (44.3 kg)   SpO2 91%   BMI 19.08 kg/m  , BMI Body mass index is 19.08 kg/m. GEN: Well nourished, well developed, in no acute distress. HEENT: normal. Neck: Supple, no JVD, carotid bruits, or masses. Cardiac: RRR, no murmurs, rubs, or  gallops. No clubbing, cyanosis, edema.  Radials/DP/PT 2+ and equal bilaterally.  Respiratory:  Respirations regular and unlabored, clear to auscultation bilaterally. GI: Soft, nontender, nondistended, BS + x 4. MS: no deformity or atrophy. Skin: warm and dry, no rash. Neuro:  Strength and sensation are intact. Psych: Normal affect.  Accessory Clinical Findings    Recent Labs: 05/20/2020: ALT 57; B Natriuretic Peptide 802.4 05/22/2020: Hemoglobin 8.1; Magnesium 1.8; Platelets 411 05/23/2020: BUN 7; Creatinine, Ser 0.70; Potassium 4.0; Sodium 139   Recent Lipid Panel    Component Value Date/Time   CHOL 148 04/23/2017 0931   TRIG 57 04/23/2017 0931   HDL 92 04/23/2017 0931   CHOLHDL 1.6 04/23/2017 0931   LDLCALC 45 04/23/2017 0931    ECG personally reviewed by me today-none today.  Carotid ultrasound 05/17/2019 Summary:  Right Carotid: Velocities in the right ICA are consistent with a 1-39%  stenosis.         Non-hemodynamically significant plaque <50% noted in the  CCA.         The RICA velocities remain within normal range and have  decreased         compared to prior exam, s/p endarterectomy.   Left Carotid: Velocities in the left ICA are consistent with a 1-39%  stenosis.        Non-hemodynamically significant plaque <50% noted in the  CCA.        The LICA velocities remain within normal range and  essentially        stable compared to prior exam.   Vertebrals: Left vertebral artery demonstrates antegrade flow. Right  vertebral        artery demonstrates retrograde flow.  Subclavians: Right subclavian artery was stenotic. Right subclavian artery  flow  was disturbed. Normal flow hemodynamics were seen in the left        subclavian artery.   Right subclavian steal. There is a 50 mmHg pressure difference between the  arms, right being the lowest. Monophasic flow noted in the right brachial   artery, biphasic flow in the left.   *See table(s) above for measurements and observations.  Suggest follow up study in 12 months.   Lower extremity ABIs 05/17/2019 Summary:  Right: Resting right ankle-brachial index indicates mild right lower  extremity arterial disease.  The right toe-brachial index is abnormal.   Left: Resting left ankle-brachial index indicates mild left lower  extremity arterial disease.  The left toe-brachial index is abnormal.      *See table(s) above for measurements and observations.    Suggest follow up study in 12 months.  Assessment & Plan   1.  Peripheral arterial disease/lower extremity pain-complains of progressive burning type pain in bilateral lower extremities.  ABIs 10/20 showed mild bilateral arterial disease.  Unclear whether this is related to peripheral arterial disease or neurological in nature. Repeat ABIs and order lower extremity arterials Continue aspirin, Crestor Heart healthy low-sodium high-fiber diet  Carotid artery disease-noted to have bilateral 1-39% stenosis on last carotid ultrasound. Repeat carotid ultrasound Continue aspirin, Crestor  Essential hypertension-BP today 102/65.  Well-controlled at home. Continue furosemide Heart healthy low-sodium diet-salty 6 given Increase physical activity as tolerated  Renal artery stenosis-history of occluded left renal artery.  Status post renal artery PTA and stenting using left brachial approach 2/16.  Stent widely patent 09/17/2018. Repeat renal Dopplers when clinically indicated.  Hyperlipidemia-LDL 45 on 04/23/2017 Continue rosuvastatin Heart healthy low-sodium high-fiber diet Increase physical activity as tolerated  Tobacco abuse-continues to use half pack per day.  History of lung cancer with radiation therapy.  Smoking cessation recommended Smoking cessation information provided  Disposition: Follow-up with Dr. Gwenlyn Watts after lower extremity arterials and lower extremity  ABIs.  Jossie Ng. Briony Parveen NP-C    10/30/2020, 2:43 PM [address] Office [phone] Fax [phone]  Notice: This dictation was prepared with Dragon dictation along with smaller phrase technology. Any transcriptional errors that result from this process are unintentional and may not be corrected upon review.  I spent 12 minutes examining this patient, reviewing medications, and using patient centered shared decision making involving her cardiac care.  Prior to her visit I spent greater than 20 minutes reviewing her past medical history,  medications, and prior cardiac tests.

## 2020-10-30 ENCOUNTER — Ambulatory Visit (INDEPENDENT_AMBULATORY_CARE_PROVIDER_SITE_OTHER): Payer: Medicare Other | Admitting: General Practice

## 2020-10-30 ENCOUNTER — Encounter: Payer: Self-pay | Admitting: General Practice

## 2020-10-30 ENCOUNTER — Other Ambulatory Visit: Payer: Self-pay

## 2020-10-30 VITALS — BP 102/65 | HR 68 | Ht 60.0 in | Wt 97.7 lb

## 2020-10-30 DIAGNOSIS — I739 Peripheral vascular disease, unspecified: Secondary | ICD-10-CM | POA: Diagnosis not present

## 2020-10-30 DIAGNOSIS — I6523 Occlusion and stenosis of bilateral carotid arteries: Secondary | ICD-10-CM | POA: Diagnosis not present

## 2020-10-30 DIAGNOSIS — I701 Atherosclerosis of renal artery: Secondary | ICD-10-CM

## 2020-10-30 DIAGNOSIS — Z720 Tobacco use: Secondary | ICD-10-CM

## 2020-10-30 DIAGNOSIS — E782 Mixed hyperlipidemia: Secondary | ICD-10-CM

## 2020-10-30 NOTE — Patient Instructions (Signed)
Medication Instructions:  The current medical regimen is effective;  continue present plan and medications as directed. Please refer to the Current Medication list given to you today.; *If you need a refill on your cardiac medications before your next appointment, please call your pharmacy*  Testing/Procedures: Your physician has requested that you have a carotid duplex. This test is an ultrasound of the carotid arteries in your neck. It looks at blood flow through these arteries that supply the brain with blood. Allow one hour for this exam. There are no restrictions or special instructions.  Your physician has requested that you have an ankle brachial index (ABI)/ lower extremity(LEA)Ultrasound . During this test an ultrasound and blood pressure cuff are used to evaluate the arteries that supply the arms and legs with blood. Allow thirty minutes for this exam. There are no restrictions or special instructions.  Special Instructions PLEASE READ AND FOLLOW SALTY 6-ATTACHED-1,800 mg daily  PLEASE INCREASE PHYSICAL ACTIVITY AS TOLERATED-RE-START PHYSICAL THERAPY EXERCISES  Follow-Up: Your next appointment:  AFTER TESTING In Person with Quay Burow, MD ONLY  At [facility], you and your health needs are our priority.  As part of our continuing mission to provide you with exceptional heart care, we have created designated Provider Care Teams.  These Care Teams include your primary Cardiologist (physician) and Advanced Practice Providers (APPs -  Physician Assistants and Nurse Practitioners) who all work together to provide you with the care you need, when you need it.

## 2020-11-08 ENCOUNTER — Other Ambulatory Visit: Payer: Self-pay

## 2020-11-08 ENCOUNTER — Ambulatory Visit (HOSPITAL_COMMUNITY)
Admission: RE | Admit: 2020-11-08 | Discharge: 2020-11-08 | Disposition: A | Discharge status: Home / Self Care | Payer: Medicare Other | Source: Ambulatory Visit | Attending: Cardiovascular Disease | Admitting: Cardiovascular Disease

## 2020-11-08 ENCOUNTER — Other Ambulatory Visit (HOSPITAL_COMMUNITY): Payer: Self-pay | Admitting: Cardiovascular Disease

## 2020-11-08 ENCOUNTER — Ambulatory Visit (HOSPITAL_BASED_OUTPATIENT_CLINIC_OR_DEPARTMENT_OTHER)
Admission: RE | Admit: 2020-11-08 | Discharge: 2020-11-08 | Disposition: A | Discharge status: Home / Self Care | Payer: Medicare Other | Source: Ambulatory Visit | Attending: Cardiovascular Disease | Admitting: Cardiovascular Disease

## 2020-11-08 DIAGNOSIS — I6523 Occlusion and stenosis of bilateral carotid arteries: Secondary | ICD-10-CM

## 2020-11-08 DIAGNOSIS — G458 Other transient cerebral ischemic attacks and related syndromes: Secondary | ICD-10-CM

## 2020-11-08 DIAGNOSIS — I739 Peripheral vascular disease, unspecified: Secondary | ICD-10-CM

## 2020-11-09 ENCOUNTER — Encounter (HOSPITAL_COMMUNITY): Payer: Medicare Other

## 2020-11-17 ENCOUNTER — Other Ambulatory Visit: Payer: Self-pay

## 2020-11-17 ENCOUNTER — Encounter: Payer: Self-pay | Admitting: Emergency Medicine

## 2020-11-17 ENCOUNTER — Ambulatory Visit (INDEPENDENT_AMBULATORY_CARE_PROVIDER_SITE_OTHER): Payer: Medicare Other | Admitting: Emergency Medicine

## 2020-11-17 DIAGNOSIS — C3491 Malignant neoplasm of unspecified part of right bronchus or lung: Secondary | ICD-10-CM

## 2020-11-17 DIAGNOSIS — C3492 Malignant neoplasm of unspecified part of left bronchus or lung: Secondary | ICD-10-CM

## 2020-11-17 DIAGNOSIS — J9611 Chronic respiratory failure with hypoxia: Secondary | ICD-10-CM | POA: Diagnosis not present

## 2020-11-17 DIAGNOSIS — J449 Chronic obstructive pulmonary disease, unspecified: Secondary | ICD-10-CM

## 2020-11-17 DIAGNOSIS — I701 Atherosclerosis of renal artery: Secondary | ICD-10-CM | POA: Diagnosis not present

## 2020-11-17 MED ORDER — BREZTRI AEROSPHERE 160-9-4.8 MCG/ACT IN AERO: 2.0000 | INHALATION_SPRAY | Freq: Two times a day (BID) | RESPIRATORY_TRACT | 0 refills | Status: DC

## 2020-11-17 MED ORDER — BREZTRI AEROSPHERE 160-9-4.8 MCG/ACT IN AERO: 2.0000 | INHALATION_SPRAY | Freq: Two times a day (BID) | RESPIRATORY_TRACT | 3 refills | Status: DC

## 2020-11-17 MED ORDER — ALBUTEROL SULFATE HFA 108 (90 BASE) MCG/ACT IN AERS: 2.0000 | INHALATION_SPRAY | Freq: Four times a day (QID) | RESPIRATORY_TRACT | 3 refills | Status: DC | PRN

## 2020-11-17 NOTE — Assessment & Plan Note (Signed)
Her oxygen has been titrated to 3 L/min.  Plan to continue same

## 2020-11-17 NOTE — Assessment & Plan Note (Addendum)
Surveillance imaging and follow-up with Dr. Sondra Come

## 2020-11-17 NOTE — Patient Instructions (Addendum)
Please stop your Stiolto for now We will try starting Breztri 2 puffs twice a day through a spacer.  Rinse and gargle after using. We will refill your albuterol.  Keep this available so that you can use 2 puffs if needed for shortness of breath, chest tightness, wheezing. You need to work on decreasing your smoking Wear your oxygen at 3 L/min Get your repeat CT scan of the chest and follow-up with Dr. Sondra Come as planned Follow with Dr. Lamonte Sakai in 2 months or sooner if you have any problems.

## 2020-11-17 NOTE — Assessment & Plan Note (Signed)
She has coughing with Stiolto and on at least 1 occasion she got choked up on the medication.  I think it would be reasonable to try to change her to an alternative through a spacer.  We will try Breztri.  If she does not tolerate the ICS then we could always change to Bevespi at some point.  I did caution her to rinse and gargle after using.  She needs a refill on her albuterol inhaler, she uses it rarely.  No flares

## 2020-11-17 NOTE — Addendum Note (Signed)
Addended by: Gavin Potters R on: 11/17/2020 04:46 PM   Modules accepted: Orders

## 2020-11-17 NOTE — Assessment & Plan Note (Signed)
Surveillance imaging and follow-up with Dr. Sondra Come

## 2020-11-17 NOTE — Addendum Note (Signed)
Addended by: Gavin Potters R on: 11/17/2020 04:41 PM   Modules accepted: Orders

## 2020-11-17 NOTE — Progress Notes (Signed)
   Subjective:    Patient ID: Kendra Watts, female    DOB: [birth year], 75 y.o.   MRN: [id number]  HPI  ROV 11/17/20 --follow-up visit for 75 year old woman with severe COPD and bilateral lung cancers (squamous cell on the left, adenocarcinoma on the right) that were diagnosed by [facility], status post SBRT.  Her recent chest imaging made no of a 1 cm right lower lobe nodule that had slowly increased in size with some mediastinal adenopathy of unclear significance.  Most recent CT 07/13/2020 reviewed by me showed no significant change in her multiple pulmonary nodules, no new nodules, emphysema.  Past medical history also significant for left-sided breast cancer, hypoxemic respiratory failure, renal artery stenosis, hypothyroidism, hypertension.  Complicated hospitalization after a fall with trauma, possible pneumonia in late October 2021. On oxygen at 3 L/min, was formerly requiring 4L/min, but was able to wean down when she was doing PT after her trauma. She is out of SNF, now back at home. She is able to get around the home, wears her O2 reliably at 3L/min.   Today she reports that she continues to smoke about 15 cigarettes daily.  We had been managing her on Stiolto but she had an episode of cough and choking when she took it a couple weeks ago so she stopped it. It was causing a lot of cough. She has been more SOB since she stopped it. She has not used any albuterol.    Review of Systems As above      Objective:   Physical Exam Vitals:   11/17/20 1613  BP: 106/66  Pulse: 68  Temp: (!) 97.3 F (36.3 C)  TempSrc: Temporal  Weight: 91 lb 6.4 oz (41.5 kg)  Height: 5' (1.524 m)   Gen: Pleasant, thin, in no distress,  normal affect  ENT: No lesions,  mouth clear,  oropharynx clear,   Neck: No JVD, no stridor  Lungs: No use of accessory muscles, distant, no wheeze on a normal breath, harsh exp rhonch on a forced exp  Cardiovascular: RRR, heart sounds normal, no murmur or gallops, no  peripheral edema  Musculoskeletal: No deformities, no cyanosis or clubbing  Neuro: alert, awake, non focal  Skin: Warm, no lesions or rash      Assessment & Plan:  Chronic obstructive pulmonary disease ([place]) She has coughing with Stiolto and on at least 1 occasion she got choked up on the medication.  I think it would be reasonable to try to change her to an alternative through a spacer.  We will try Breztri.  If she does not tolerate the ICS then we could always change to Bevespi at some point.  I did caution her to rinse and gargle after using.  She needs a refill on her albuterol inhaler, she uses it rarely.  No flares  Chronic respiratory failure with hypoxia (HCC) Her oxygen has been titrated to 3 L/min.  Plan to continue same  Adenocarcinoma, lung, right [facility]) Surveillance imaging and follow-up with Dr. Sondra Come  Squamous cell lung cancer, left [facility]) Surveillance imaging and follow-up with Dr. Crista Curb, MD, PhD 11/17/2020, 4:36 PM [facility] [phone] or if no answer [phone]

## 2020-11-27 ENCOUNTER — Other Ambulatory Visit: Payer: Self-pay | Admitting: Hematology and Oncology

## 2020-11-27 DIAGNOSIS — Z9889 Other specified postprocedural states: Secondary | ICD-10-CM

## 2020-11-29 ENCOUNTER — Ambulatory Visit (INDEPENDENT_AMBULATORY_CARE_PROVIDER_SITE_OTHER): Payer: Medicare Other | Admitting: Cardiovascular Disease

## 2020-11-29 ENCOUNTER — Other Ambulatory Visit: Payer: Self-pay

## 2020-11-29 ENCOUNTER — Encounter: Payer: Self-pay | Admitting: Cardiovascular Disease

## 2020-11-29 VITALS — BP 98/58 | HR 64 | Ht 60.0 in | Wt 92.0 lb

## 2020-11-29 DIAGNOSIS — I701 Atherosclerosis of renal artery: Secondary | ICD-10-CM

## 2020-11-29 DIAGNOSIS — R200 Anesthesia of skin: Secondary | ICD-10-CM

## 2020-11-29 DIAGNOSIS — I10 Essential (primary) hypertension: Secondary | ICD-10-CM | POA: Diagnosis not present

## 2020-11-29 DIAGNOSIS — E782 Mixed hyperlipidemia: Secondary | ICD-10-CM

## 2020-11-29 DIAGNOSIS — Z720 Tobacco use: Secondary | ICD-10-CM

## 2020-11-29 NOTE — Assessment & Plan Note (Signed)
History of ongoing tobacco abuse of 1/2 to 1 pack/day despite a diagnosis of lung cancer several years ago status postradiation therapy.

## 2020-11-29 NOTE — Assessment & Plan Note (Signed)
History of essential hypertension a blood pressure measured today at 98/58 in the right arm. n she is on metoprolol.

## 2020-11-29 NOTE — Assessment & Plan Note (Signed)
History of right upper extremity numbness with recent Dopplers that showed a blood pressure differential in the right as compared to the left upper extremity suggesting that she had right innominate versus subclavian stenosis.

## 2020-11-29 NOTE — Progress Notes (Signed)
11/29/2020 Kendra Watts [facility]   05-29-1946  [id number]  Primary Physician Merrilee Seashore, MD Primary Cardiologist: Lorretta Harp MD Garret Reddish, [place], [place]  HPI:  Kendra Watts is a 75 y.o.  hin-appearing divorced Caucasian female with no children who I last saw on 04/27/2019... She is retired from doing [employer] at [employer]. She has a history of continued tobacco abuse at 1 pack per day, hypertension and hyperlipidemia with a strong family history for heart disease. Her mother had her first MI at age 22 and died at age 82. I have angiogrammed her revealing a tight right internal carotid artery stenosis, as well as a right subclavian artery stenosis. 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 I stented her left renal artery and she has moderate bilateral iliac disease but really denies claudication.She did had elective right carotid endarterectomy performed by Dr. Trula Slade April 02, 2012.since I saw her a year ago she's been remarkably stable. Carotid Dopplers performed /23/15 revealed a widely patent right carotid endarterectomy site and minimal left carotid disease. Renal Dopplers social progression of disease in the right renal artery. Her left kidney continues to diminish in size despite a patent stent. Recent renal Dopplers performed in April show progression of disease on the right as well.Lower extremity arterial Dopplers to show progression of disease in the iliac arteries as she denies claudication.she denies chest pain or shortness of breath. 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 Follow-up renal Doppler studies performed on 05/19/14 revealed progression of disease in her right renal artery with a renal aortic ratio that went from 6.34-7.37. I'm concerned that she has progression of disease in her only functioning kidney. I performed angiography on  her 07/04/14 via the left brachial approach revealing occluded left renal artery stent and 90% calcified ostial downgoing right renal artery. I decided not to intervene at that time. I'm slightly concerned about the technical difficulties with an ostial calcified lesion with regard to dissection and/or rupture. Her only other option would be renal bypass surgery. I performed percutaneous revascularization on her right renal artery ostia on 08/22/14 via the left brachial approach reducing a 95% calcified ostial right renal artery stenosis and a solitary kidney to 0% residual with an ICast covered Stent. Her most recent renal Doppler study performed 06/07/15 revealed this to be widely patent. Since I saw her a year ago she's remained asymptomatic. She denies chest pain, shortness of breath. She does have some pain in her legs which sound like restless leg syndrome. She had ductal breast carcinoma with lumpectomy performed by Dr. [name] 09/22/15 with subsequent radiation therapy. She did not require chemotherapy. Since I saw her last she's noticed some discomfort in her left wrist when she walks as well as tingling. 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  She  was unfortunately diagnosed with lung cancer in 2019 and has had radiation therapy.  She was admitted to the hospital 07/06/2018 with respiratory failure probably related to COPD flare and diastolic dysfunction.  She was diuresed.  She is also found to be significantly anemic and was transfused.  Since that time she does complain of chronic shortness of breath as well as lower extremity claudication.  She unfortunately continues to smoke.  Since I saw her a year and a half ago she is on oxygen  24/7.  She has normal LV function by 2D echocardiogram performed 05/21/2020.  She does complain of burning in her legs but no numbness or claudication.  Interestingly, her abdominal ultrasound did reveal a 524 cm/s jet in her  mid aorta although with lack of symptoms I do not think this needs to be further evaluated.   Current Meds  Medication Sig  . albuterol (VENTOLIN HFA) 108 (90 Base) MCG/ACT inhaler Inhale 2 puffs into the lungs every 6 (six) hours as needed for wheezing or shortness of breath.  Marland Kitchen aspirin 81 MG tablet Take 1 tablet (81 mg total) by mouth daily.  . Budeson-Glycopyrrol-Formoterol (BREZTRI AEROSPHERE) 160-9-4.8 MCG/ACT AERO Inhale 2 puffs into the lungs in the morning and at bedtime.  . Calcium-Magnesium-Vitamin D (CALCIUM 1200+D3 PO) Take 1 tablet by mouth daily.  Marland Kitchen ibandronate (BONIVA) 150 MG tablet Take 150 mg by mouth every 30 (thirty) days.  Marland Kitchen levothyroxine (SYNTHROID) 50 MCG tablet Take 50 mcg by mouth daily.  . metoprolol tartrate (LOPRESSOR) 25 MG tablet Take 25 mg by mouth daily.  . Multiple Vitamins-Minerals (MULTIVITAMIN WITH MINERALS) tablet Take 1 tablet by mouth daily.  . OXYGEN Inhale 2-4 L into the lungs as needed. As needed to maintain SATS > 90%  . pantoprazole (PROTONIX) 40 MG tablet Take 1 tablet (40 mg total) by mouth daily.  . rosuvastatin (CRESTOR) 10 MG tablet Take 10 mg by mouth daily.  . tamoxifen (NOLVADEX) 20 MG tablet Take 1 tablet (20 mg total) by mouth daily.  . [DISCONTINUED] Budeson-Glycopyrrol-Formoterol (BREZTRI AEROSPHERE) 160-9-4.8 MCG/ACT AERO Inhale 2 puffs into the lungs in the morning and at bedtime.  . [DISCONTINUED] Tiotropium Bromide-Olodaterol (STIOLTO RESPIMAT) 2.5-2.5 MCG/ACT AERS Inhale 2 puffs into the lungs daily.     No Known Allergies  Social History   Socioeconomic History  . Marital status: Divorced    Spouse name: Not on file  . Number of children: 0  . Years of education: College  . Highest education level: Bachelor's degree (e.g., BA, AB, BS)  Occupational History  . Occupation: HR Technical brewer: [employer]  Tobacco Use  . Smoking status: Current Every Day Smoker    Packs/day: 0.50    Years: 40.00    Pack  years: 20.00    Types: Cigarettes  . Smokeless tobacco: Never Used  . Tobacco comment: 15 cigarettes smoked daily 11/17/20 [facility]   Vaping Use  . Vaping Use: Never used  Substance and Sexual Activity  . Alcohol use: Yes    Comment: occasionally  . Drug use: No  . Sexual activity: Never  Other Topics Concern  . Not on file  Social History Narrative  . Not on file   Social Determinants of Health   Financial Resource Strain: Low Risk   . Difficulty of Paying Living Expenses: Not hard at all  Food Insecurity: No Food Insecurity  . Worried About Charity fundraiser in the Last Year: Never true  . Ran Out of Food in the Last Year: Never true  Transportation Needs: No Transportation Needs  . Lack of Transportation (Medical): No  . Lack of Transportation (Non-Medical): No  Physical Activity: Insufficiently Active  . Days of Exercise per Week: 2 days  . Minutes of Exercise per Session: 50 min  Stress: No Stress Concern Present  . Feeling of Stress : Only a little  Social Connections: Moderately Isolated  . Frequency of Communication with Friends and Family: More than three times a week  .  Frequency of Social Gatherings with Friends and Family: More than three times a week  . Attends Religious Services: 1 to 4 times per year  . Active Member of Clubs or Organizations: No  . Attends Archivist Meetings: Never  . Marital Status: Divorced  Human resources officer Violence: Not At Risk  . Fear of Current or Ex-Partner: No  . Emotionally Abused: No  . Physically Abused: No  . Sexually Abused: No     Review of Systems: General: negative for chills, fever, night sweats or weight changes.  Cardiovascular: negative for chest pain, dyspnea on exertion, edema, orthopnea, palpitations, paroxysmal nocturnal dyspnea or shortness of breath Dermatological: negative for rash Respiratory: negative for cough or wheezing Urologic: negative for hematuria Abdominal: negative for nausea, vomiting,  diarrhea, bright red blood per rectum, melena, or hematemesis Neurologic: negative for visual changes, syncope, or dizziness All other systems reviewed and are otherwise negative except as noted above.    Blood pressure (!) 98/58, pulse 64, height 5' (1.524 m), weight 92 lb (41.7 kg).  General appearance: alert and no distress Neck: no adenopathy, no JVD, supple, symmetrical, trachea midline, thyroid not enlarged, symmetric, no tenderness/mass/nodules and Bilateral carotid bruits Lungs: clear to auscultation bilaterally Heart: regular rate and rhythm, S1, S2 normal, no murmur, click, rub or gallop Extremities: extremities normal, atraumatic, no cyanosis or edema Pulses: 2+ and symmetric Skin: Skin color, texture, turgor normal. No rashes or lesions Neurologic: Alert and oriented X 3, normal strength and tone. Normal symmetric reflexes. Normal coordination and gait  EKG normal sinus rhythm at 64 with septal Q waves and nonspecific ST and T wave changes.  I personally reviewed this EKG.  ASSESSMENT AND PLAN:   Essential hypertension History of essential hypertension a blood pressure measured today at 98/58 in the right arm. n she is on metoprolol.  Atherosclerotic renal artery stenosis, bilateral (HCC) History of renal artery stenosis with a known occluded left renal artery status post remote left renal artery stenting.  I performed angiography on her 07/04/2014 via the left brachial approach revealing an occluded left renal artery stent and 90% calcified ostial downgoing right renal artery stenosis.  I decided not to intervene at that time.  I brought her back and performed PTA and covered stenting using an I-Cath stent via the left brachial approach with excellent result.  Her Dopplers normalized.  Her last renal Doppler study performed 11/02/2019 revealed a right renal pole-to-pole dimension of 10.4 cm with a renal aortic ratio 5.24.  This will be rechecked.  Hyperlipidemia History of  hyperlipidemia not on statin therapy with lipid profile performed 11/01/2019 revealing LDL of 51.  Tobacco abuse History of ongoing tobacco abuse of 1/2 to 1 pack/day despite a diagnosis of lung cancer several years ago status postradiation therapy.  Right upper extremity numbness History of right upper extremity numbness with recent Dopplers that showed a blood pressure differential in the right as compared to the left upper extremity suggesting that she had right innominate versus subclavian stenosis.      Lorretta Harp MD FACP,FACC,FAHA, [facility] 11/29/2020 2:29 PM

## 2020-11-29 NOTE — Assessment & Plan Note (Signed)
History of renal artery stenosis with a known occluded left renal artery status post remote left renal artery stenting.  I performed angiography on her 07/04/2014 via the left brachial approach revealing an occluded left renal artery stent and 90% calcified ostial downgoing right renal artery stenosis.  I decided not to intervene at that time.  I brought her back and performed PTA and covered stenting using an I-Cath stent via the left brachial approach with excellent result.  Her Dopplers normalized.  Her last renal Doppler study performed 11/02/2019 revealed a right renal pole-to-pole dimension of 10.4 cm with a renal aortic ratio 5.24.  This will be rechecked.

## 2020-11-29 NOTE — Patient Instructions (Signed)
Medication Instructions:  Your physician recommends that you continue on your current medications as directed. Please refer to the Current Medication list given to you today.  *If you need a refill on your cardiac medications before your next appointment, please call your pharmacy*   Testing/Procedures: Your physician has requested that you have a renal artery duplex. During this test, an ultrasound is used to evaluate blood flow to the kidneys. Allow one hour for this exam. Do not eat after midnight the day before and avoid carbonated beverages. Take your medications as you usually do. This procedure is done at [facility]. 2nd Floor.    Follow-Up: At [facility], you and your health needs are our priority.  As part of our continuing mission to provide you with exceptional heart care, we have created designated Provider Care Teams.  These Care Teams include your primary Cardiologist (physician) and Advanced Practice Providers (APPs -  Physician Assistants and Nurse Practitioners) who all work together to provide you with the care you need, when you need it.  We recommend signing up for the patient portal called "MyChart".  Sign up information is provided on this After Visit Summary.  MyChart is used to connect with patients for Virtual Visits (Telemedicine).  Patients are able to view lab/test results, encounter notes, upcoming appointments, etc.  Non-urgent messages can be sent to your provider as well.   To learn more about what you can do with MyChart, go to [email or web address].    Your next appointment:   6 month(s)  The format for your next appointment:   In Person  Provider:   You will see one of the following Advanced Practice Providers on your designated Care Team:    Coletta Memos, FNP  Then, Quay Burow, MD will plan to see you again in 12 month(s).

## 2020-11-29 NOTE — Assessment & Plan Note (Signed)
History of hyperlipidemia not on statin therapy with lipid profile performed 11/01/2019 revealing LDL of 51.

## 2020-12-06 ENCOUNTER — Telehealth: Payer: Self-pay | Admitting: Emergency Medicine

## 2020-12-06 MED ORDER — BREZTRI AEROSPHERE 160-9-4.8 MCG/ACT IN AERO
2.0000 | INHALATION_SPRAY | Freq: Two times a day (BID) | RESPIRATORY_TRACT | 3 refills | Status: DC
Start: 2020-12-06 — End: 2021-04-10

## 2020-12-06 NOTE — Telephone Encounter (Signed)
Called and spoke with patient who is asking for a prescription to be sent to her preferred pharmacy for [facility]. She verified pharmacy. Prescription sent. Advised her to call if there was any issues. Nothing further needed at this time.

## 2020-12-13 ENCOUNTER — Other Ambulatory Visit: Payer: Self-pay

## 2020-12-13 ENCOUNTER — Ambulatory Visit
Admission: RE | Admit: 2020-12-13 | Discharge: 2020-12-13 | Disposition: A | Discharge status: Home / Self Care | Payer: Medicare Other | Source: Ambulatory Visit | Attending: Hematology and Oncology | Admitting: Hematology and Oncology

## 2020-12-13 ENCOUNTER — Other Ambulatory Visit: Payer: Self-pay | Admitting: Hematology and Oncology

## 2020-12-13 DIAGNOSIS — Z1231 Encounter for screening mammogram for malignant neoplasm of breast: Secondary | ICD-10-CM

## 2020-12-13 DIAGNOSIS — Z9889 Other specified postprocedural states: Secondary | ICD-10-CM

## 2020-12-27 ENCOUNTER — Inpatient Hospital Stay (HOSPITAL_COMMUNITY): Admission: RE | Admit: 2020-12-27 | Payer: Medicare Other | Source: Ambulatory Visit

## 2020-12-27 DIAGNOSIS — I129 Hypertensive chronic kidney disease with stage 1 through stage 4 chronic kidney disease, or unspecified chronic kidney disease: Secondary | ICD-10-CM | POA: Diagnosis not present

## 2020-12-27 DIAGNOSIS — I700 Atherosclerosis of aorta: Secondary | ICD-10-CM | POA: Diagnosis not present

## 2020-12-27 DIAGNOSIS — E46 Unspecified protein-calorie malnutrition: Secondary | ICD-10-CM | POA: Diagnosis not present

## 2020-12-27 DIAGNOSIS — E782 Mixed hyperlipidemia: Secondary | ICD-10-CM | POA: Diagnosis not present

## 2020-12-27 DIAGNOSIS — G629 Polyneuropathy, unspecified: Secondary | ICD-10-CM | POA: Diagnosis not present

## 2020-12-27 DIAGNOSIS — C50412 Malignant neoplasm of upper-outer quadrant of left female breast: Secondary | ICD-10-CM | POA: Diagnosis not present

## 2020-12-27 DIAGNOSIS — I739 Peripheral vascular disease, unspecified: Secondary | ICD-10-CM | POA: Diagnosis not present

## 2020-12-27 DIAGNOSIS — C3490 Malignant neoplasm of unspecified part of unspecified bronchus or lung: Secondary | ICD-10-CM | POA: Diagnosis not present

## 2020-12-27 DIAGNOSIS — E890 Postprocedural hypothyroidism: Secondary | ICD-10-CM | POA: Diagnosis not present

## 2021-01-10 ENCOUNTER — Ambulatory Visit (HOSPITAL_COMMUNITY): Admission: RE | Admit: 2021-01-10 | Payer: Medicare Other | Source: Ambulatory Visit

## 2021-01-17 ENCOUNTER — Encounter: Payer: Self-pay | Admitting: Radiation Oncology

## 2021-01-23 ENCOUNTER — Other Ambulatory Visit: Payer: Self-pay

## 2021-01-23 ENCOUNTER — Ambulatory Visit (HOSPITAL_COMMUNITY)
Admission: RE | Admit: 2021-01-23 | Discharge: 2021-01-23 | Disposition: A | Discharge status: Home / Self Care | Payer: Medicare Other | Source: Ambulatory Visit | Attending: Radiation Oncology | Admitting: Radiation Oncology

## 2021-01-23 DIAGNOSIS — I700 Atherosclerosis of aorta: Secondary | ICD-10-CM | POA: Diagnosis not present

## 2021-01-23 DIAGNOSIS — C3491 Malignant neoplasm of unspecified part of right bronchus or lung: Secondary | ICD-10-CM

## 2021-01-23 DIAGNOSIS — J439 Emphysema, unspecified: Secondary | ICD-10-CM | POA: Diagnosis not present

## 2021-01-23 DIAGNOSIS — C3490 Malignant neoplasm of unspecified part of unspecified bronchus or lung: Secondary | ICD-10-CM | POA: Diagnosis not present

## 2021-01-23 DIAGNOSIS — R911 Solitary pulmonary nodule: Secondary | ICD-10-CM | POA: Diagnosis not present

## 2021-01-23 DIAGNOSIS — R918 Other nonspecific abnormal finding of lung field: Secondary | ICD-10-CM | POA: Diagnosis not present

## 2021-01-25 ENCOUNTER — Ambulatory Visit
Admission: RE | Admit: 2021-01-25 | Discharge: 2021-01-25 | Disposition: A | Discharge status: Home / Self Care | Payer: Medicare Other | Source: Ambulatory Visit | Attending: Radiation Oncology | Admitting: Radiation Oncology

## 2021-01-25 ENCOUNTER — Telehealth: Payer: Self-pay | Admitting: *Deleted

## 2021-01-25 HISTORY — DX: Personal history of irradiation: Z92.3

## 2021-01-25 NOTE — Telephone Encounter (Signed)
RETURNED PATIENT'S PHONE CALL, RESCHEDULED FU FOR 01-25-21 DUE TO DELAYED DELIVERY OF PATIENT'S OXYGEN, SPOKE WITH PATIENT AND FU HAS BEEN RESCHEDULED

## 2021-02-01 DIAGNOSIS — I129 Hypertensive chronic kidney disease with stage 1 through stage 4 chronic kidney disease, or unspecified chronic kidney disease: Secondary | ICD-10-CM | POA: Diagnosis not present

## 2021-02-01 DIAGNOSIS — E782 Mixed hyperlipidemia: Secondary | ICD-10-CM | POA: Diagnosis not present

## 2021-02-01 DIAGNOSIS — R7303 Prediabetes: Secondary | ICD-10-CM | POA: Diagnosis not present

## 2021-02-01 DIAGNOSIS — I739 Peripheral vascular disease, unspecified: Secondary | ICD-10-CM | POA: Diagnosis not present

## 2021-02-01 DIAGNOSIS — I700 Atherosclerosis of aorta: Secondary | ICD-10-CM | POA: Diagnosis not present

## 2021-02-01 DIAGNOSIS — G629 Polyneuropathy, unspecified: Secondary | ICD-10-CM | POA: Diagnosis not present

## 2021-02-01 DIAGNOSIS — E890 Postprocedural hypothyroidism: Secondary | ICD-10-CM | POA: Diagnosis not present

## 2021-02-06 ENCOUNTER — Ambulatory Visit (HOSPITAL_COMMUNITY)
Admission: RE | Admit: 2021-02-06 | Payer: Medicare Other | Source: Ambulatory Visit | Attending: Cardiovascular Disease | Admitting: Cardiovascular Disease

## 2021-02-07 ENCOUNTER — Encounter: Payer: Self-pay | Admitting: Radiology

## 2021-02-09 ENCOUNTER — Encounter: Payer: Self-pay | Admitting: Radiology

## 2021-02-11 NOTE — Progress Notes (Addendum)
Radiation Oncology         [phone] ________________________________  Name: Kendra Watts MRN: [id number]  Date: 02/12/2021  DOB: July 05, 1946  Follow-Up Visit Note  CC: Merrilee Seashore, MD  Nicholas Lose, MD    ICD-10-CM   1. Malignant neoplasm of upper-outer quadrant of left breast in female, estrogen receptor positive ([place])  C50.412    Z17.0     2. Squamous cell lung cancer, left (HCC)  C34.92     3. Adenocarcinoma, lung, right ([place])  C34.91 CT CHEST WO CONTRAST      Diagnosis:   1. 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 Stage IA (pT1c, pN0) left breast invasive ductal carcinoma, ER+ Ysidro Evert /Her2-  Interval Since Last Radiation: 2 years, 3 months, and 12 days   Radiation Treatment Dates/ Dose/Site/Rate: 1. 10/27/2018, 10/29/2018, 11/02/2018: Left and Right Lung targets were each treated to 54 Gy in 3 fractions  2. 10/30/2015 - 11/28/2015: Left Breast with Boost for a total of 52.72 Gy in 21 fractions  Narrative:  The patient returns today for routine follow-up. (Rescheduled from 01/25/21, she was last seen on 12/23/19).   The patient's most recent imaging and findings are as follows:  -Chest CT taken on 07/13/20 demonstrating: no significant change seen in the appearance of the bilateral pulmonary nodules, as well as no new suspicious lung nodules visualized. -Bilateral Mammogram w/ tomo and cad on 12/13/20 demonstrating: no mammographic evidence of malignancy.  -Chest CT taken on 01/23/21 demonstrating: the bilateral upper lobe pulmonary nodules to appear unchanged from previous imaging with normal radiation changes and fiducial markers.   The patient has had 6 hospital visits since she was last seen, with the most recent being on 05/20/20 in which she presented to the [facility] ED with  shortness of breath. She was seen by Dr. Wynelle Cleveland who stated that the patient's O2 saturation noted by EMS was 55% on 2L. She was placed with a non- rebreather and was given a albuterol inhaler. Her O2 level eventually improved and was weaned down to 6L on the non-rebreather. While in the ED she was noted to have a BP of 70/50 and given 2 L IVF, her Hb was also noted to be 6.8. While in the ED she became progressively hypoxic and short of breath and needed 6 L O2 and then a non rebreather mask again. Her active problems were listed as follows:  Pulmonary edema- acute diastolic CHF, acute on chronic hypoxic respiratory failure, and possible COPD exacerbation.  The patient was noted to be coughing up yellow/green sputum and blood. Crackles at both bases were heard upon auscultation to the patients lungs.  Chest CT and abdominal CT taken during her hospital stay showed some consolidation/ atelectasis in the right lung more than the left. Also seen were mainly chronic changes in the lungs and small bilateral pleural effusions, there was no noteable evidence of pneumothorax. Of note: her previous ED visit on 04/08/20 was due to a fall which resulted in the patient fracturing her hip.   During a visit with [name] on 08/01/20, the patient was noted to be doing well overall, and using her supplemental oxygen 24/7.  She does notice dyspnea on exertion..  She continues on 3 L of oxygen 24/7.  She denies any significant pain within the chest area cough or hemoptysis.  She occasionally will have a problem swallowing large pills.   Allergies:  has No Known Allergies.  Meds: Current Outpatient Medications  Medication Sig Dispense Refill   albuterol (VENTOLIN HFA) 108 (90 Base) MCG/ACT inhaler Inhale 2 puffs into the lungs every 6 (six) hours as needed for wheezing or shortness of breath. 8 g 3   aspirin 81 MG tablet Take 1 tablet (81 mg total) by mouth daily.     Budeson-Glycopyrrol-Formoterol (BREZTRI  AEROSPHERE) 160-9-4.8 MCG/ACT AERO Inhale 2 puffs into the lungs in the morning and at bedtime. 10.7 g 3   levothyroxine (SYNTHROID) 50 MCG tablet Take 50 mcg by mouth daily.     metoprolol tartrate (LOPRESSOR) 25 MG tablet Take 25 mg by mouth daily.     OXYGEN Inhale 2-4 L into the lungs as needed. As needed to maintain SATS > 90%     pantoprazole (PROTONIX) 40 MG tablet Take 1 tablet (40 mg total) by mouth daily. 30 tablet 0   rosuvastatin (CRESTOR) 10 MG tablet Take 10 mg by mouth daily.     tamoxifen (NOLVADEX) 20 MG tablet Take 1 tablet (20 mg total) by mouth daily. 90 tablet 3   Calcium-Magnesium-Vitamin D (CALCIUM 1200+D3 PO) Take 1 tablet by mouth daily. (Patient not taking: Reported on 02/12/2021)     doxazosin (CARDURA) 8 MG tablet Take 1 tablet by mouth daily.     furosemide (LASIX) 20 MG tablet Take 1 tablet (20 mg total) by mouth daily. 30 tablet 0   ibandronate (BONIVA) 150 MG tablet Take 150 mg by mouth every 30 (thirty) days. (Patient not taking: Reported on 02/12/2021)     Multiple Vitamins-Minerals (MULTIVITAMIN WITH MINERALS) tablet Take 1 tablet by mouth daily. (Patient not taking: Reported on 02/12/2021)     No current facility-administered medications for this encounter.    Physical Findings: The patient is in no acute distress. Patient is alert and oriented.  height is 5' (1.524 m) and weight is 89 lb 3.2 oz (40.5 kg). Her temperature is 97.6 F (36.4 C). Her blood pressure is 113/71 and her pulse is 72. Her respiration is 20 and oxygen saturation is 95%. .  No significant changes. Lungs are clear to auscultation bilaterally. Heart has regular rate and rhythm. No palpable cervical, supraclavicular, or axillary adenopathy. Abdomen soft, non-tender, normal bowel sounds.  Supplemental oxygen in place by nasal cannula at 3 L.  Breast follow-up per [name]    Lab Findings: Lab Results  Component Value Date   WBC 9.5 05/22/2020   HGB 8.1 (L) 05/22/2020   HCT 25.4 (L)  05/22/2020   MCV 86.4 05/22/2020   PLT 411 (H) 05/22/2020    Radiographic Findings: CT Chest Wo Contrast  Result Date: 01/23/2021 CLINICAL DATA:  Follow-up non-small cell lung cancer EXAM: CT CHEST WITHOUT CONTRAST TECHNIQUE: Multidetector CT imaging of the chest was performed following the standard protocol without IV contrast. COMPARISON:  07/13/2020 FINDINGS: Cardiovascular: Heart is normal in size.  No pericardial effusion. No evidence of thoracic aortic aneurysm. Atherosclerotic calcifications of the aortic arch. Aberrant right subclavian artery. Three vessel coronary atherosclerosis. Mediastinum/Nodes: Small mediastinal lymph nodes, including a 7 mm short axis low right paratracheal node (series 2/image 57), within normal limits. No suspicious axillary lymphadenopathy. Lungs/Pleura: 7 x 10 mm nodule in the medial right upper lobe (  series 5/image 23), unchanged, with surrounding radiation changes in adjacent fiducial markers. 7 x 17 mm irregular nodule in the posterior left upper lobe (series 5/image 36), grossly unchanged, with surrounding radiation changes in adjacent fiducial markers. Irregular nodular scarring in the lateral right lower lobe (series 5/image 99), previously reflecting a more discrete rounded nodule on remote priors, but unchanged on recent studies. 3 mm nodule in the posterior left lower lobe (series 5/image 36), unchanged. No new/suspicious pulmonary nodules. Moderate centrilobular and paraseptal emphysematous changes, upper lung predominant. No focal consolidation. No pleural effusion or pneumothorax. Upper Abdomen: Visualized upper abdomen is notable for left renal atrophy with a probable left upper pole renal cyst and extensive vascular calcifications. Musculoskeletal: Status post left breast lumpectomy with skin thickening suggesting radiation changes. Stable severe compression fracture deformity at T3 (sagittal image 69). Mild degenerative changes of the upper lumbar spine. Old  right anterior 3rd rib fracture deformity. Old left lateral 3rd and 4th rib fracture deformities. IMPRESSION: Bilateral upper lobe pulmonary nodules are grossly unchanged, with surrounding radiation changes and fiducial markers. Status post left breast lumpectomy with radiation changes. Additional stable ancillary findings as above. Aortic Atherosclerosis (ICD10-I70.0) and Emphysema (ICD10-J43.9). Electronically Signed   By: [name] Hy M.D.   On: 01/23/2021 20:57    Impression:   1. Synchronous bilateral stage 1 lung cancers             (a) Squamous cell carcinoma in the left upper lobe stage IA3 (cT1c, N0, M0)             (b) Adenocarcinoma in the right upper lobe stage IA2 (cT1b, N0, M0)       Recent chest CT scan shows no residual tumor.  She has radiation changes in both lungs as expected.  No signs of recurrence on clinical exam today.  Plan: Routine follow-up in 6 months.  Prior to this follow-up she will undergo a repeat chest CT scan.   20 minutes of total time was spent for this patient encounter, including preparation, face-to-face counseling with the patient and coordination of care, physical exam, and documentation of the encounter and ordering of upcoming chest CT scan. ____________________________________  Blair Promise, PhD, MD   This document serves as a record of services personally performed by [name], MD. It was created on his behalf by Roney Mans, a trained medical scribe. The creation of this record is based on the scribe's personal observations and the provider's statements to them. This document has been checked and approved by the attending provider.

## 2021-02-12 ENCOUNTER — Other Ambulatory Visit: Payer: Self-pay

## 2021-02-12 ENCOUNTER — Ambulatory Visit
Admission: RE | Admit: 2021-02-12 | Discharge: 2021-02-12 | Disposition: A | Discharge status: Home / Self Care | Payer: Medicare Other | Source: Ambulatory Visit | Attending: Radiation Oncology | Admitting: Radiation Oncology

## 2021-02-12 ENCOUNTER — Encounter: Payer: Self-pay | Admitting: Radiation Oncology

## 2021-02-12 VITALS — BP 113/71 | HR 72 | Temp 97.6°F | Resp 20 | Ht 60.0 in | Wt 89.2 lb

## 2021-02-12 DIAGNOSIS — Z923 Personal history of irradiation: Secondary | ICD-10-CM | POA: Insufficient documentation

## 2021-02-12 DIAGNOSIS — C50412 Malignant neoplasm of upper-outer quadrant of left female breast: Secondary | ICD-10-CM

## 2021-02-12 DIAGNOSIS — Z7982 Long term (current) use of aspirin: Secondary | ICD-10-CM | POA: Insufficient documentation

## 2021-02-12 DIAGNOSIS — J432 Centrilobular emphysema: Secondary | ICD-10-CM | POA: Diagnosis not present

## 2021-02-12 DIAGNOSIS — J90 Pleural effusion, not elsewhere classified: Secondary | ICD-10-CM | POA: Insufficient documentation

## 2021-02-12 DIAGNOSIS — J9621 Acute and chronic respiratory failure with hypoxia: Secondary | ICD-10-CM | POA: Diagnosis not present

## 2021-02-12 DIAGNOSIS — Z85118 Personal history of other malignant neoplasm of bronchus and lung: Secondary | ICD-10-CM | POA: Diagnosis not present

## 2021-02-12 DIAGNOSIS — C3492 Malignant neoplasm of unspecified part of left bronchus or lung: Secondary | ICD-10-CM

## 2021-02-12 DIAGNOSIS — Z79899 Other long term (current) drug therapy: Secondary | ICD-10-CM | POA: Insufficient documentation

## 2021-02-12 DIAGNOSIS — Q278 Other specified congenital malformations of peripheral vascular system: Secondary | ICD-10-CM | POA: Insufficient documentation

## 2021-02-12 DIAGNOSIS — Z170 Estrogen receptor positive status [ER+]: Secondary | ICD-10-CM

## 2021-02-12 DIAGNOSIS — Z853 Personal history of malignant neoplasm of breast: Secondary | ICD-10-CM | POA: Diagnosis not present

## 2021-02-12 DIAGNOSIS — Z08 Encounter for follow-up examination after completed treatment for malignant neoplasm: Secondary | ICD-10-CM | POA: Diagnosis not present

## 2021-02-12 DIAGNOSIS — C3491 Malignant neoplasm of unspecified part of right bronchus or lung: Secondary | ICD-10-CM

## 2021-02-12 NOTE — Progress Notes (Signed)
Kendra Watts is here today for follow up post radiation to the lung.  Lung Side: bilateral  Does the patient complain of any of the following: Pain:denies Shortness of breath w/wo exertion: shortness of breath with exertion Cough: productive cough with clear to yellow phlegm Hemoptysis: denies Pain with swallowing: denies Swallowing/choking concerns: difficulty swallowing larger things such as large pills Appetite: fair Energy Level: poor to fair Post radiation skin Changes: denies  Kendra Watts is here today for follow up post radiation to the breast.   Breast Side:left   They completed their radiation on: 11/28/2015 for left breast, 11/02/2018 for bilateral lungs   Does the patient complain of any of the following: Breast Tenderness: denies Breast Swelling: denies Lymph[place]a: left axilla and breast Range of Motion limitations: good range of motion  Additional comments if applicable: none  Vitals:   02/12/21 1549  BP: 113/71  Pulse: 72  Resp: 20  Temp: 97.6 F (36.4 C)  SpO2: 95%  Weight: 89 lb 3.2 oz (40.5 kg)  Height: 5' (1.524 m)  PF: (!) 3 L/min

## 2021-02-28 ENCOUNTER — Ambulatory Visit (HOSPITAL_COMMUNITY)
Admission: RE | Admit: 2021-02-28 | Discharge: 2021-02-28 | Disposition: A | Discharge status: Home / Self Care | Payer: Medicare Other | Source: Ambulatory Visit | Attending: Cardiology | Admitting: Cardiology

## 2021-02-28 ENCOUNTER — Other Ambulatory Visit: Payer: Self-pay

## 2021-02-28 DIAGNOSIS — Z9889 Other specified postprocedural states: Secondary | ICD-10-CM | POA: Insufficient documentation

## 2021-02-28 DIAGNOSIS — I701 Atherosclerosis of renal artery: Secondary | ICD-10-CM | POA: Diagnosis not present

## 2021-03-02 ENCOUNTER — Encounter: Payer: Self-pay | Admitting: *Deleted

## 2021-03-27 ENCOUNTER — Encounter: Payer: Self-pay | Admitting: Gastroenterology

## 2021-03-29 ENCOUNTER — Other Ambulatory Visit: Payer: Self-pay

## 2021-03-29 ENCOUNTER — Ambulatory Visit (INDEPENDENT_AMBULATORY_CARE_PROVIDER_SITE_OTHER): Payer: Medicare Other | Admitting: Otolaryngology

## 2021-03-29 DIAGNOSIS — B370 Candidal stomatitis: Secondary | ICD-10-CM

## 2021-03-29 DIAGNOSIS — H6123 Impacted cerumen, bilateral: Secondary | ICD-10-CM

## 2021-03-29 DIAGNOSIS — I701 Atherosclerosis of renal artery: Secondary | ICD-10-CM | POA: Diagnosis not present

## 2021-03-29 MED ORDER — NYSTATIN 100000 UNIT/ML MT SUSP: 5.0000 mL | Freq: Four times a day (QID) | OROMUCOSAL | 0 refills | Status: DC

## 2021-03-29 NOTE — Progress Notes (Signed)
HPI: Kendra Watts is a 75 y.o. female who presents is referred by her PCP for evaluation of wax buildup in her ears as well as some throat discomfort.  She has had occasional difficulty swallowing large pills.  She has had minimal throat discomfort..  Past Medical History:  Diagnosis Date   Anemia    Aortic arch anomaly    arteria lusoria    Breast cancer ([place]) 09/22/2015   Malignant   Breast cancer of upper-outer quadrant of left female breast ([place]) 09/08/2015   Cataract, immature    CHF (congestive heart failure) ([place])    Acute CHF-06/2018   COPD (chronic obstructive pulmonary disease) ([place])    Femur fracture, left ([place]) 04/11/2020   left femur fracture( greater trochanter   Heart murmur    states no known problems   History of hyperthyroidism    History of radiation therapy    bilateral lungs - 10/27/18-11/02/18, Dr. [name]   History of radiation therapy 11/28/2015   left breast 10/30/2015-11/28/2015   Dr [name]   Hyperlipidemia    Hypertension    states under control with meds., has been on med. x "long time"   Hypokalemia    from last physical.    Hypothyroidism    Nonfunctioning kidney    left   Personal history of radiation therapy    2017   Pulmonary nodules    Bilateral   Radiation 10/30/15-11/28/15   left breast 42.72 Gy, boosted to 10 Gy   Renal artery stenosis (HCC)    Tobacco abuse    Wears partial dentures    upper and lower   Past Surgical History:  Procedure Laterality Date   ABDOMINAL ANGIOGRAM  02/18/2012   Procedure: ABDOMINAL ANGIOGRAM;  Surgeon: Lorretta Harp, MD;  Location: [facility] CATH LAB;  Service: Cardiovascular;;   ABDOMINAL AORTAGRAM  07/04/2014   ABDOMINAL HYSTERECTOMY  ~ 1977   partial   APPENDECTOMY     ARCH AORTOGRAM     BREAST LUMPECTOMY Left 09/22/2015   Malignant   CAROTID ANGIOGRAM N/A 02/18/2012   Procedure: CAROTID ANGIOGRAM;  Surgeon: Lorretta Harp, MD;  Location: [facility] CATH LAB;  Service: Cardiovascular;  Laterality:  N/A;   ENDARTERECTOMY  04/02/2012   Procedure: ENDARTERECTOMY CAROTID;  Surgeon: Serafina Mitchell, MD;  Location: [place];  Service: Vascular;  Laterality: Right;   FUDUCIAL PLACEMENT Bilateral 09/02/2018   Procedure: Placement Of Fiducial to right upper lobe & left upper lobe lung;  Surgeon: [name], MD;  Location: [place];  Service: Thoracic;  Laterality: Bilateral;   IR THORACENTESIS ASP PLEURAL SPACE W/IMG GUIDE  07/08/2018   RADIOACTIVE SEED GUIDED PARTIAL MASTECTOMY WITH AXILLARY SENTINEL LYMPH NODE BIOPSY Left 09/22/2015   Procedure: INJECT BLUE DYE LEFT BREAST,RADIOACTIVE SEED GUIDED PARTIAL MASTECTOMY WITH AXILLARY SENTINEL LYMPH NODE BIOPSY;  Surgeon: Fanny Skates, MD;  Location: [place];  Service: General;  Laterality: Left;   RENAL ANGIOGRAM Left 06/08/2010   renal artery stent -  5x12 Genesis on Aviator balloon stent (Dr. Adora Fridge)   RENAL ANGIOGRAM Right 07/04/2014   Procedure: RENAL ANGIOGRAM;  Surgeon: Lorretta Harp, MD;  Location: [facility] CATH LAB;  Service: Cardiovascular;  Laterality: Right;   RENAL ANGIOGRAM Right 08/22/2014   Procedure: RENAL ANGIOGRAM;  Surgeon: Lorretta Harp, MD;  Location: [facility] CATH LAB;  Service: Cardiovascular;  Laterality: Right;   TONSILLECTOMY     as a child   VIDEO BRONCHOSCOPY WITH ENDOBRONCHIAL NAVIGATION N/A 09/02/2018  Procedure: VIDEO BRONCHOSCOPY WITH ENDOBRONCHIAL NAVIGATION;  Surgeon: [name], MD;  Location: [facility] OR;  Service: Thoracic;  Laterality: N/A;   Social History   Socioeconomic History   Marital status: Divorced    Spouse name: Not on file   Number of children: 0   Years of education: College   Highest education level: Bachelor's degree (e.g., BA, AB, BS)  Occupational History   Occupation: HR Technical brewer: [employer]  Tobacco Use   Smoking status: Every Day    Packs/day: 0.50    Years: 40.00    Pack years: 20.00    Types: Cigarettes   Smokeless tobacco: Never   Tobacco  comments:    15 cigarettes smoked daily 11/17/20 [facility]   Vaping Use   Vaping Use: Never used  Substance and Sexual Activity   Alcohol use: Yes    Comment: occasionally   Drug use: No   Sexual activity: Never  Other Topics Concern   Not on file  Social History Narrative   Not on file   Social Determinants of Health   Financial Resource Strain: Low Risk    Difficulty of Paying Living Expenses: Not hard at all  Food Insecurity: No Food Insecurity   Worried About Charity fundraiser in the Last Year: Never true   [place] in the Last Year: Never true  Transportation Needs: No Transportation Needs   Lack of Transportation (Medical): No   Lack of Transportation (Non-Medical): No  Physical Activity: Insufficiently Active   Days of Exercise per Week: 2 days   Minutes of Exercise per Session: 50 min  Stress: No Stress Concern Present   Feeling of Stress : Only a little  Social Connections: Moderately Isolated   Frequency of Communication with Friends and Family: More than three times a week   Frequency of Social Gatherings with Friends and Family: More than three times a week   Attends Religious Services: 1 to 4 times per year   Active Member of [employer] or Organizations: No   Attends Music therapist: Never   Marital Status: Divorced   Family History  Problem Relation Age of Onset   Heart disease Mother        MI @ 66, died at 63   Cancer Father    Lung cancer Father    Heart disease Maternal Grandmother    Lung cancer Sister    Breast cancer Paternal Aunt    No Known Allergies Prior to Admission medications   Medication Sig Start Date End Date Taking? Authorizing Provider  albuterol (VENTOLIN HFA) 108 (90 Base) MCG/ACT inhaler Inhale 2 puffs into the lungs every 6 (six) hours as needed for wheezing or shortness of breath. 11/17/20   Byrum, Rose Fillers, MD  aspirin 81 MG tablet Take 1 tablet (81 mg total) by mouth daily. 03/21/20   Hongalgi, Lenis Dickinson, MD   Budeson-Glycopyrrol-Formoterol (BREZTRI AEROSPHERE) 160-9-4.8 MCG/ACT AERO Inhale 2 puffs into the lungs in the morning and at bedtime. 12/06/20   [name], MD  Calcium-Magnesium-Vitamin D (CALCIUM 1200+D3 PO) Take 1 tablet by mouth daily. Patient not taking: Reported on 02/12/2021    [provider]  doxazosin (CARDURA) 8 MG tablet Take 1 tablet by mouth daily.    [provider]  furosemide (LASIX) 20 MG tablet Take 1 tablet (20 mg total) by mouth daily. 07/13/18 05/20/20  Kayleen Memos, DO  ibandronate (BONIVA) 150 MG tablet Take 150 mg  by mouth every 30 (thirty) days. Patient not taking: Reported on 02/12/2021 01/19/20   [provider]  levothyroxine (SYNTHROID) 50 MCG tablet Take 50 mcg by mouth daily. 12/08/19   [provider]  metoprolol tartrate (LOPRESSOR) 25 MG tablet Take 25 mg by mouth daily. 09/21/20   [provider]  Multiple Vitamins-Minerals (MULTIVITAMIN WITH MINERALS) tablet Take 1 tablet by mouth daily. Patient not taking: Reported on 02/12/2021    [provider]  OXYGEN Inhale 2-4 L into the lungs as needed. As needed to maintain SATS > 90%    [provider]  pantoprazole (PROTONIX) 40 MG tablet Take 1 tablet (40 mg total) by mouth daily. 07/14/18   Kayleen Memos, DO  rosuvastatin (CRESTOR) 10 MG tablet Take 10 mg by mouth daily.    [provider]  tamoxifen (NOLVADEX) 20 MG tablet Take 1 tablet (20 mg total) by mouth daily. 09/21/20   Nicholas Lose, MD     Positive ROS: Otherwise negative  All other systems have been reviewed and were otherwise negative with the exception of those mentioned in the HPI and as above.  Physical Exam: Constitutional: Alert, well-appearing, no acute distress Ears: External ears without lesions or tenderness. Ear canals with a mod amount of wax buildup in both ear canals.  She wears bilateral hearing aids.  After cleaning the wax with a curette.  The TMs were  otherwise clear. Nasal: External nose without lesions. Septum with mild deformity and mild rhinitis.. Clear nasal passages otherwise. Oral: Lips and gums without lesions. Tongue and palate mucosa without lesions.  The palate mucosa has some white debris and mild erythema consistent with probable Candida or thrush.  On indirect laryngoscopy this extends down and does involve the supraglottic mucosa.  The vocal cords were clear bilaterally with normal vocal mobility bilaterally.  Both piriform sinuses were clear. Neck: No palpable adenopathy or masses. Respiratory: Breathing comfortably  Skin: No facial/neck lesions or rash noted.  Cerumen impaction removal  Date/Time: 03/29/2021 4:46 PM Performed by: Rozetta Nunnery, MD Authorized by: Rozetta Nunnery, MD   Consent:    Consent obtained:  Verbal   Consent given by:  Patient   Risks discussed:  Pain and bleeding Procedure details:    Location:  L ear and R ear   Procedure type: curette   Post-procedure details:    Inspection:  TM intact and canal normal   Hearing quality:  Improved   Procedure completion:  Tolerated well, no immediate complications Comments:     Wax was cleaned from both ear canals with a curette.  Both TMs were clear otherwise.  Assessment: Wax buildup in both ear canals and patient who wears hearing aids. Oropharyngeal thrush.  Plan: Ear canals were cleaned in the office today. Sent a prescription in for nystatin suspension to use 3 times daily for the next week   Radene Journey, MD   CC:

## 2021-04-09 ENCOUNTER — Other Ambulatory Visit: Payer: Self-pay | Admitting: Emergency Medicine

## 2021-04-19 ENCOUNTER — Ambulatory Visit (INDEPENDENT_AMBULATORY_CARE_PROVIDER_SITE_OTHER): Payer: Medicare Other | Admitting: Emergency Medicine

## 2021-04-19 ENCOUNTER — Encounter: Payer: Self-pay | Admitting: Emergency Medicine

## 2021-04-19 ENCOUNTER — Other Ambulatory Visit: Payer: Self-pay | Admitting: Emergency Medicine

## 2021-04-19 ENCOUNTER — Other Ambulatory Visit: Payer: Self-pay

## 2021-04-19 VITALS — BP 102/60 | HR 72 | Temp 98.1°F | Ht 60.0 in | Wt 90.8 lb

## 2021-04-19 DIAGNOSIS — Z720 Tobacco use: Secondary | ICD-10-CM | POA: Diagnosis not present

## 2021-04-19 DIAGNOSIS — C3491 Malignant neoplasm of unspecified part of right bronchus or lung: Secondary | ICD-10-CM

## 2021-04-19 DIAGNOSIS — Z23 Encounter for immunization: Secondary | ICD-10-CM | POA: Diagnosis not present

## 2021-04-19 DIAGNOSIS — I701 Atherosclerosis of renal artery: Secondary | ICD-10-CM | POA: Diagnosis not present

## 2021-04-19 DIAGNOSIS — J449 Chronic obstructive pulmonary disease, unspecified: Secondary | ICD-10-CM

## 2021-04-19 DIAGNOSIS — J9611 Chronic respiratory failure with hypoxia: Secondary | ICD-10-CM | POA: Diagnosis not present

## 2021-04-19 MED ORDER — BEVESPI AEROSPHERE 9-4.8 MCG/ACT IN AERO: 2.0000 | INHALATION_SPRAY | Freq: Every day | RESPIRATORY_TRACT | 5 refills | Status: DC

## 2021-04-19 NOTE — Progress Notes (Signed)
Subjective:    Patient ID: Kendra Watts, female    DOB: 1945/10/19, 75 y.o.   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  Her recent chest imaging made no of a 1 cm right lower lobe nodule that had slowly increased in size with some mediastinal adenopathy of unclear significance.  Most recent CT 07/13/2020 reviewed by me showed no significant change in her multiple pulmonary nodules, no new nodules, emphysema.  Past medical history also significant for left-sided breast cancer, hypoxemic respiratory failure, renal artery stenosis, hypothyroidism, hypertension.  Complicated hospitalization after a fall with trauma, possible pneumonia in late October 2021. On oxygen at 3 L/min, was formerly requiring 4L/min, but was able to wean down when she was doing PT after her trauma. She is out of SNF, now back at home. She is able to get around the home, wears her O2 reliably at 3L/min.   Today she reports that she continues to smoke about 15 cigarettes daily.  We had been managing her on Stiolto but she had an episode of cough and choking when she took it a couple weeks ago so she stopped it. It was causing a lot of cough. She has been more SOB since she stopped it. She has not used any albuterol.   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  She has hypoxemic respiratory failure on 3 L/min.  She has a 1 cm right lower lobe pulmonary nodule, multiple other nodules, all stable on most recent CT from 01/23/2021.  At her last visit I tried changing her to [place] from [place].  Today she reports that she was recently treated for possible thrush w Nystatin. Seemed to have helped her some. She has not had  any exacerbations. No recent abx either. Has not needed any albuterol. Needs flu shot. Has had the 1st 2 covid shots.    Review of Systems As above      Objective:   Physical Exam Vitals:   04/19/21 1416  BP: 102/60  Pulse: 72  Temp: 98.1 F (36.7 C)  TempSrc: Oral  SpO2: 94%  Weight: 90 lb 12.8 oz (41.2 kg)  Height: 5' (1.524 m)   Gen: Pleasant, thin, in no distress,  normal affect  ENT: No lesions,  mouth clear, some mild while film posterior tongue  Neck: No JVD, no stridor  Lungs: No use of accessory muscles, distant, no wheeze on a normal breath, soft exp wheeze on forced exp  Cardiovascular: RRR, heart sounds normal, no murmur or gallops, no peripheral edema  Musculoskeletal: No deformities, no cyanosis or clubbing  Neuro: alert, awake, non focal  Skin: Warm, no lesions or rash      Assessment & Plan:  Chronic obstructive pulmonary disease ([place]) She seemed to have benefited from the [place] but she developed thrush.  She was just treated with nystatin.  I think we should change to [facility] and we will do this today.  Keep albuterol available to use if needed.  She needs a flu shot today.  She is due for the COVID booster and I encouraged her to get the new booster this fall.  Adenocarcinoma, lung, right [facility]) Following with Dr. Sondra Come with radiation oncology.  Her  CT scan in July showed stable nodules, next planned for 6 months  Chronic respiratory failure with hypoxia (HCC) Continue oxygen 3 L/min at all times.  Tobacco abuse Encouraged cessation  Baltazar Apo, MD, PhD 04/19/2021, 2:57 PM [facility] [phone] or if no answer [phone]

## 2021-04-19 NOTE — Assessment & Plan Note (Signed)
Following with Dr. Sondra Come with radiation oncology.  Her CT scan in July showed stable nodules, next planned for 6 months

## 2021-04-19 NOTE — Assessment & Plan Note (Signed)
Encouraged cessation.

## 2021-04-19 NOTE — Assessment & Plan Note (Signed)
Continue oxygen 3 L/min at all times.

## 2021-04-19 NOTE — Patient Instructions (Signed)
[name]  We will start Bevespi 2 puffs twice a day. Keep your albuterol available use 2 puffs or 1 nebulizer treatment up to every 4 hours if needed for shortness of breath, chest tightness, wheezing. Continue your oxygen at 3 L/min Flu shot today You should get the new COVID-19 booster this fall Follow with radiation oncology and get your repeat CT scan of the chest per their plans. Follow with Dr Lamonte Sakai in 6 months or sooner if you have any problems

## 2021-04-19 NOTE — Assessment & Plan Note (Addendum)
She seemed to have benefited from the [place] but she developed thrush.  She was just treated with nystatin.  I think we should change to [facility] and we will do this today.  Keep albuterol available to use if needed.  She needs a flu shot today.  She is due for the COVID booster and I encouraged her to get the new booster this fall.

## 2021-04-19 NOTE — Addendum Note (Signed)
Addended by: Konrad Felix L on: 04/19/2021 03:33 PM   Modules accepted: Orders

## 2021-05-03 ENCOUNTER — Telehealth: Payer: Self-pay | Admitting: Emergency Medicine

## 2021-05-03 DIAGNOSIS — B370 Candidal stomatitis: Secondary | ICD-10-CM

## 2021-05-04 ENCOUNTER — Other Ambulatory Visit (HOSPITAL_COMMUNITY): Payer: Self-pay

## 2021-05-04 NOTE — Telephone Encounter (Signed)
Call made to patient, confirmed DOB. She states RB took her off the Breztri due to thrush however she is willing to deal with the thrush because the bevespi is $400. She prefers to keep using the Breztri until she hears back from Dr Lamonte Sakai. She reports she was given nystatin mouth wash at the end of September stating she does not have any thrust at that moment but would like to have the nystatin on stand by for the future. She reports the thrush does not bother her and she prefers to stay on the [place].   Pt aware RB returns next week and there will be a delayed response.   RB please advise. Thanks :)

## 2021-05-04 NOTE — Telephone Encounter (Signed)
Need to try to determine why the high cost >> is Bevespi not covered? Is she is the donut hole? This will help me decide what to have her on long term. A copy of her formulary would be helpful if she can provide.  Forward this info to our clinical pharmacy team to see if they can help answer these questions.

## 2021-05-04 NOTE — Telephone Encounter (Signed)
Noted.   Will route message to pharmacy team.

## 2021-05-04 NOTE — Telephone Encounter (Signed)
Patient's preferred inhalres are Stiolto and [employer]  Per test claim, one Anoro inhaler is $125 Per test claim, one Stiolto inhaler is $9.99  Knox Saliva, PharmD, MPH, BCPS Clinical Pharmacist (Rheumatology and Pulmonology)

## 2021-05-08 MED ORDER — STIOLTO RESPIMAT 2.5-2.5 MCG/ACT IN AERS: 2.0000 | INHALATION_SPRAY | Freq: Every day | RESPIRATORY_TRACT | 11 refills | Status: DC

## 2021-05-08 NOTE — Telephone Encounter (Signed)
Ask her if she has every taken Stiolto, whether she would be willing to try this instead.

## 2021-05-08 NOTE — Telephone Encounter (Signed)
Spoke with the pt  She states okay with trying the stiolto again  Rx sent  She states that she is still dealing with thrush  She wants refill on Nystatin  Is this okay to send?

## 2021-05-08 NOTE — Telephone Encounter (Signed)
Huge cost difference between the two. I'd do Stiolto if she thinks it helped or if she can tolerate. If not then go with the Anoro.

## 2021-05-08 NOTE — Telephone Encounter (Signed)
Reviewed her chart  She has been on Stiolto in the past  Other option looks like Anoro and I do not see that she has tried this

## 2021-05-09 MED ORDER — NYSTATIN 100000 UNIT/ML MT SUSP: 5.0000 mL | Freq: Four times a day (QID) | OROMUCOSAL | 0 refills | Status: DC

## 2021-05-09 NOTE — Telephone Encounter (Signed)
Yes ok to send.  

## 2021-05-09 NOTE — Telephone Encounter (Signed)
I have called the patient and she is aware that the Nystatin has been sent to the pharmacy. Nothing further needed.

## 2021-05-24 DIAGNOSIS — E890 Postprocedural hypothyroidism: Secondary | ICD-10-CM | POA: Diagnosis not present

## 2021-05-24 DIAGNOSIS — I739 Peripheral vascular disease, unspecified: Secondary | ICD-10-CM | POA: Diagnosis not present

## 2021-05-24 DIAGNOSIS — I129 Hypertensive chronic kidney disease with stage 1 through stage 4 chronic kidney disease, or unspecified chronic kidney disease: Secondary | ICD-10-CM | POA: Diagnosis not present

## 2021-05-24 DIAGNOSIS — R7303 Prediabetes: Secondary | ICD-10-CM | POA: Diagnosis not present

## 2021-05-24 DIAGNOSIS — E782 Mixed hyperlipidemia: Secondary | ICD-10-CM | POA: Diagnosis not present

## 2021-05-24 DIAGNOSIS — I700 Atherosclerosis of aorta: Secondary | ICD-10-CM | POA: Diagnosis not present

## 2021-05-30 DIAGNOSIS — I10 Essential (primary) hypertension: Secondary | ICD-10-CM | POA: Diagnosis not present

## 2021-05-30 DIAGNOSIS — E46 Unspecified protein-calorie malnutrition: Secondary | ICD-10-CM | POA: Diagnosis not present

## 2021-05-30 DIAGNOSIS — I5032 Chronic diastolic (congestive) heart failure: Secondary | ICD-10-CM | POA: Diagnosis not present

## 2021-05-30 DIAGNOSIS — Z9981 Dependence on supplemental oxygen: Secondary | ICD-10-CM | POA: Diagnosis not present

## 2021-05-30 DIAGNOSIS — R7303 Prediabetes: Secondary | ICD-10-CM | POA: Diagnosis not present

## 2021-05-30 DIAGNOSIS — J9611 Chronic respiratory failure with hypoxia: Secondary | ICD-10-CM | POA: Diagnosis not present

## 2021-05-30 DIAGNOSIS — Z0000 Encounter for general adult medical examination without abnormal findings: Secondary | ICD-10-CM | POA: Diagnosis not present

## 2021-05-30 DIAGNOSIS — J432 Centrilobular emphysema: Secondary | ICD-10-CM | POA: Diagnosis not present

## 2021-05-30 DIAGNOSIS — C50412 Malignant neoplasm of upper-outer quadrant of left female breast: Secondary | ICD-10-CM | POA: Diagnosis not present

## 2021-05-30 DIAGNOSIS — I779 Disorder of arteries and arterioles, unspecified: Secondary | ICD-10-CM | POA: Diagnosis not present

## 2021-05-30 DIAGNOSIS — I700 Atherosclerosis of aorta: Secondary | ICD-10-CM | POA: Diagnosis not present

## 2021-05-30 DIAGNOSIS — N182 Chronic kidney disease, stage 2 (mild): Secondary | ICD-10-CM | POA: Diagnosis not present

## 2021-06-01 ENCOUNTER — Telehealth: Payer: Self-pay | Admitting: Emergency Medicine

## 2021-06-01 NOTE — Telephone Encounter (Signed)
Called and spoke with pt who states she is not able to tolerate the Stiolto as she said immediately after taking it, she gets choked after taking it when she goes to inhale it.  Pt said even though the Bevespi causes thrush, it does work the best for her and she is willing to go back on the bevespi and also have magic mouth wash to use with it if this is okay.  Dr. Lamonte Sakai, please advise.

## 2021-06-05 NOTE — Telephone Encounter (Signed)
Patient checking on inhalers message. Patient phone number is [phone].

## 2021-06-05 NOTE — Telephone Encounter (Signed)
I have attempted to call the pt back but there was no answer and no VM

## 2021-06-05 NOTE — Telephone Encounter (Signed)
I am ok with her going back to the bevespi

## 2021-06-07 ENCOUNTER — Other Ambulatory Visit: Payer: Self-pay | Admitting: Emergency Medicine

## 2021-06-07 MED ORDER — BEVESPI AEROSPHERE 9-4.8 MCG/ACT IN AERO
2.0000 | INHALATION_SPRAY | Freq: Two times a day (BID) | RESPIRATORY_TRACT | 11 refills | Status: DC
Start: 2021-06-07 — End: 2021-08-20

## 2021-06-07 NOTE — Telephone Encounter (Signed)
Called and spoke with the pt and notified of response per Dr Lamonte Sakai  She verbalized understanding  I have sent new rx to her preferred pharm

## 2021-06-08 ENCOUNTER — Telehealth: Payer: Self-pay | Admitting: Emergency Medicine

## 2021-06-08 NOTE — Telephone Encounter (Signed)
RB pt stated that the insurance does not cover the bevespi and she was wanting to try [facility].  Please advise if ok to send this in to the pharmacy.  Thanks

## 2021-06-08 NOTE — Telephone Encounter (Signed)
Rec'd message from pharmacy Anoro is not on formulary, they are requesting an alternative   Dr. Lamonte Sakai please advise

## 2021-06-08 NOTE — Telephone Encounter (Signed)
I have attempted to call the pt but there was no answer and no VM.  Will try back later.

## 2021-06-08 NOTE — Telephone Encounter (Signed)
Last time she had thrush on the [place].  I am okay with her retrying it but it may be problematic.  It would be helpful if we get the rest of her formulary so if the [place] does not work out we can know what other options we can try.

## 2021-06-12 MED ORDER — BREZTRI AEROSPHERE 160-9-4.8 MCG/ACT IN AERO: 2.0000 | INHALATION_SPRAY | Freq: Two times a day (BID) | RESPIRATORY_TRACT | 3 refills | Status: AC

## 2021-06-12 NOTE — Telephone Encounter (Signed)
Spoke with pt and informed her we would restart Breztri. Pt was provided education on rinsing out mouth after last puff. Pt stated understanding. Pt also said she would work on providing [place] with inhaler formulary as well. Pharmacy verified and inhaler order placed. Nothing further needed at this time.

## 2021-07-27 ENCOUNTER — Ambulatory Visit (HOSPITAL_COMMUNITY)
Admission: RE | Admit: 2021-07-27 | Discharge: 2021-07-27 | Disposition: A | Discharge status: Home / Self Care | Payer: Medicare Other | Source: Ambulatory Visit | Attending: Hematology and Oncology | Admitting: Hematology and Oncology

## 2021-07-27 ENCOUNTER — Other Ambulatory Visit: Payer: Self-pay

## 2021-07-27 DIAGNOSIS — J439 Emphysema, unspecified: Secondary | ICD-10-CM | POA: Diagnosis not present

## 2021-07-27 DIAGNOSIS — C3490 Malignant neoplasm of unspecified part of unspecified bronchus or lung: Secondary | ICD-10-CM | POA: Diagnosis not present

## 2021-07-27 DIAGNOSIS — I700 Atherosclerosis of aorta: Secondary | ICD-10-CM | POA: Diagnosis not present

## 2021-07-27 LAB — POCT I-STAT CREATININE: Creatinine, Ser: 0.8 mg/dL (ref 0.44–1.00)

## 2021-07-27 MED ORDER — SODIUM CHLORIDE (PF) 0.9 % IJ SOLN
INTRAMUSCULAR | Status: AC
  Filled 2021-07-27: qty 50

## 2021-07-27 MED ORDER — IOHEXOL 350 MG/ML SOLN
40.0000 mL | Freq: Once | INTRAVENOUS | Status: AC | PRN
  Administered 2021-07-27: 40 mL via INTRAVENOUS

## 2021-07-31 ENCOUNTER — Telehealth: Payer: Self-pay | Admitting: *Deleted

## 2021-07-31 ENCOUNTER — Telehealth: Payer: Self-pay | Admitting: Hematology and Oncology

## 2021-07-31 NOTE — Telephone Encounter (Signed)
RETURNED PATIENT'S PHONE CALL, SPOKE WITH PATIENT. ?

## 2021-07-31 NOTE — Telephone Encounter (Signed)
R/s per 1/9 sch msg, pt has been called and confirmed new appt

## 2021-08-01 ENCOUNTER — Inpatient Hospital Stay: Payer: Medicare Other | Admitting: Hematology and Oncology

## 2021-08-02 ENCOUNTER — Inpatient Hospital Stay: Payer: Medicare Other | Admitting: Hematology and Oncology

## 2021-08-18 NOTE — Progress Notes (Signed)
Patient Care Team: Merrilee Seashore, MD as PCP - General (Internal Medicine) Lorretta Harp, MD as PCP - Cardiology (Cardiology) Lorretta Harp, MD as Attending Physician (Cardiology) Milus Banister, MD (Gastroenterology) Molli Posey, MD as Consulting Physician (Obstetrics and Gynecology) Sylvan Cheese, NP as Nurse Practitioner (Hematology and Oncology)  DIAGNOSIS:    ICD-10-CM   1. Squamous cell lung cancer, left (HCC)  C34.92     2. Malignant neoplasm of upper-outer quadrant of left breast in female, estrogen receptor positive ([place])  C50.412    Z17.0       SUMMARY OF ONCOLOGIC HISTORY: Oncology History  Breast cancer of upper-outer quadrant of left female breast ([place])  08/30/2015 Imaging   DEXA scan: Normal    09/07/2015 Initial Diagnosis   Screening detected left breast mass at 1:00 position 4 x 3 x 4 mm biopsy grade 1 invasive ductal carcinoma ER 100% PR 90% positive HER-2 negative ratio 1.18 Ki-67 10%, T1 aN0 stage IA clinical stage   09/22/2015 Surgery   Left lumpectomy [name]): IDC grade 1, 1.1 minutes, ADH, LCIS, margins negative, 0/3 lymph nodes, ER 100%, PR 90%, HER-2 negative, Ki-67 10%, T1 cN0 stage IA, Oncotype DX score 14, 9% ROR   10/30/2015 - 11/28/2015 Radiation Therapy   Adjuvant XRT ([place]). Left breast: 42.72 Gy in 16 treatments.  Left breast "boost": 10 Gy in 5 treatments.  Total: 52.72 Gy in 21 treatments    11/2015 -  Anti-estrogen oral therapy   Tamoxifen 37m daily. Planned duration of treatment: 5 years ([facility])   Squamous cell lung cancer, left ([place]  09/10/2018 Initial Diagnosis   Squamous cell lung cancer, left ([place]   09/10/2018 Cancer Staging   Staging form: Lung, AJCC 8th Edition - Clinical stage from 09/10/2018: cT1, cN0, cM0 - Signed by GNicholas Lose MD on 09/10/2018    10/27/2018 - 11/02/2018 Radiation Therapy   Stereotactic radiosurgery   Adenocarcinoma, lung, right ([place]  09/02/2018 Initial Diagnosis   Lung nodule  evaluation of her former smoker with shortness of breath during hospitalization, PET CT, right upper lobe nodule 1.2 x 1.1 cm SUV 7.6 biopsy revealed adenocarcinoma positive for TTF-1 and Napsin A, left upper lobe nodule 2.5 x 2.1 cm SUV 9.8, no lymph node hypermetabolism, 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 and TTF-1 is negative   09/10/2018 Cancer Staging   Staging form: Lung, AJCC 8th Edition - Clinical stage from 09/10/2018: cT1, cN0, cM0 - Signed by GNicholas Lose MD on 09/10/2018    10/27/2018 - 11/02/2018 Radiation Therapy   Stereotactic radiosurgery     CHIEF COMPLIANT: Follow-up of breast and lung cancers  INTERVAL HISTORY: Kendra WERNTZis a 76y.o. with above-mentioned history of breast cancer and bilateral lung cancer. Mammogram on 12/13/2020 showed no evidence of malignancy. Chest CT on 01/23/2021 showed stable bilateral pulmonary nodules and no new evidence of malignancy. She presents to the clinic today for follow-up.  Patient uses oxygen 24 x 7 and is wheelchair-bound.  She has generalized weakness.  ALLERGIES:  has No Known Allergies.  MEDICATIONS:  Current Outpatient Medications  Medication Sig Dispense Refill   albuterol (VENTOLIN HFA) 108 (90 Base) MCG/ACT inhaler Inhale 2 puffs into the lungs every 6 (six) hours as needed for wheezing or shortness of breath. 8 g 3   aspirin 81 MG tablet Take 1 tablet (81 mg total) by mouth daily.     Budeson-Glycopyrrol-Formoterol (BREZTRI AEROSPHERE) 160-9-4.8 MCG/ACT AERO Inhale 2  puffs into the lungs in the morning and at bedtime. 10.7 g 3   Calcium-Magnesium-Vitamin D (CALCIUM 1200+D3 PO) Take 1 tablet by mouth daily.     doxazosin (CARDURA) 8 MG tablet Take 1 tablet by mouth daily.     furosemide (LASIX) 20 MG tablet Take 1 tablet (20 mg total) by mouth daily. 30 tablet 0   Glycopyrrolate-Formoterol (BEVESPI AEROSPHERE) 9-4.8 MCG/ACT AERO Inhale 2 puffs into the lungs in the morning and at bedtime. 10.7  g 11   ibandronate (BONIVA) 150 MG tablet Take 150 mg by mouth every 30 (thirty) days. (Patient not taking: Reported on 04/19/2021)     levothyroxine (SYNTHROID) 50 MCG tablet Take 50 mcg by mouth daily.     metoprolol tartrate (LOPRESSOR) 25 MG tablet Take 25 mg by mouth daily.     Multiple Vitamins-Minerals (MULTIVITAMIN WITH MINERALS) tablet Take 1 tablet by mouth daily.     nystatin (MYCOSTATIN) 100000 UNIT/ML suspension Take 5 mLs (500,000 Units total) by mouth 4 (four) times daily. Rinse mouth and swallow 5 cc 4 times daily for the next 6 days. 120 mL 0   OXYGEN Inhale 2-4 L into the lungs as needed. As needed to maintain SATS > 90%     pantoprazole (PROTONIX) 40 MG tablet Take 1 tablet (40 mg total) by mouth daily. 30 tablet 0   rosuvastatin (CRESTOR) 10 MG tablet Take 10 mg by mouth daily.     tamoxifen (NOLVADEX) 20 MG tablet Take 1 tablet (20 mg total) by mouth daily. 90 tablet 3   Tiotropium Bromide-Olodaterol (STIOLTO RESPIMAT) 2.5-2.5 MCG/ACT AERS Inhale 2 puffs into the lungs daily. 4 g 11   No current facility-administered medications for this visit.    PHYSICAL EXAMINATION: ECOG PERFORMANCE STATUS: 1 - Symptomatic but completely ambulatory  Vitals:   08/20/21 1441  BP: 118/72  Pulse: 78  Resp: 18  Temp: 98.1 F (36.7 C)  SpO2: 95%   Filed Weights   08/20/21 1441  Weight: 84 lb 6 oz (38.3 kg)    BREAST: No palpable masses or nodules in either right or left breasts. No palpable axillary supraclavicular or infraclavicular adenopathy no breast tenderness or nipple discharge. (exam performed in the presence of a chaperone)  LABORATORY DATA:  I have reviewed the data as listed CMP Latest Ref Rng & Units 07/27/2021 05/23/2020 05/22/2020  Glucose 70 - 99 mg/dL - 97 112(H)  BUN 8 - 23 mg/dL - 7(L) 9  Creatinine 0.44 - 1.00 mg/dL 0.80 0.70 0.70  Sodium 135 - 145 mmol/L - 139 141  Potassium 3.5 - 5.1 mmol/L - 4.0 3.5  Chloride 98 - 111 mmol/L - 91(L) 91(L)  CO2 22 - 32  mmol/L - 40(H) 40(H)  Calcium 8.9 - 10.3 mg/dL - 8.5(L) 8.6(L)  Total Protein 6.5 - 8.1 g/dL - - -  Total Bilirubin 0.3 - 1.2 mg/dL - - -  Alkaline Phos 38 - 126 U/L - - -  AST 15 - 41 U/L - - -  ALT 0 - 44 U/L - - -    Lab Results  Component Value Date   WBC 9.5 05/22/2020   HGB 8.1 (L) 05/22/2020   HCT 25.4 (L) 05/22/2020   MCV 86.4 05/22/2020   PLT 411 (H) 05/22/2020   NEUTROABS 10.1 (H) 05/20/2020    ASSESSMENT & PLAN:  Squamous cell lung cancer, left ([place]) 09/02/2018: Lung nodule evaluation of her former smoker with shortness of breath during hospitalization,  PET CT,  right  upper lobe nodule 1.2 x 1.1 cm SUV 7.6 biopsy revealed adenocarcinoma positive for TTF-1 and Napsin A,  left upper lobe nodule 2.5 x 2.1 cm SUV 9.8, no lymph node hypermetabolism, 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 and TTF-1 is negative   Stereotactic radiosurgery April 2020 CT chest 07/28/2021: Stable postradiation fibrosis.  Aortic atherosclerosis.  Emphysema, severe atrophy of left kidney No evidence of lung cancer.  Breast cancer of upper-outer quadrant of left female breast ([place]) Left lumpectomy [name]): IDC grade 1, 1.1 minutes, ADH, LCIS, margins negative, 0/3 lymph nodes, ER 100%, PR 90%, HER-2 negative, Ki-67 10%, T1 cN0 stage IA, Oncotype DX score 14, 9% ROR Adjuvant radiation therapy completed 11/28/2015 Current treatment: Tamoxifen started 12/09/2015 Tamoxifen toxicities:  Recommended discontinuation of tamoxifen therapy at this time having completed 5 years. Breast cancer surveillance:  Mammogram 12/15/2020: Benign breast density category C Breast exam: Left breast lymphedema causing the fluid buildup in the breast.  I encouraged her to use supportive bras.    Return to clinic in 1 year for follow-up No orders of the defined types were placed in this encounter.  The patient has a good understanding of the overall plan. she agrees with it. she will call with  any problems that may develop before the next visit here.  Total time spent: 30 mins including face to face time and time spent for planning, charting and coordination of care  Rulon Eisenmenger, MD, MPH 08/20/2021  I, Thana Ates, am acting as scribe for Dr. Nicholas Lose.  I have reviewed the above documentation for accuracy and completeness, and I agree with the above.

## 2021-08-20 ENCOUNTER — Other Ambulatory Visit: Payer: Self-pay

## 2021-08-20 ENCOUNTER — Ambulatory Visit: Payer: Self-pay | Admitting: Radiation Oncology

## 2021-08-20 ENCOUNTER — Inpatient Hospital Stay: Payer: Medicare Other | Attending: Hematology and Oncology | Admitting: Hematology and Oncology

## 2021-08-20 VITALS — BP 118/72 | HR 78 | Temp 98.1°F | Resp 18 | Ht 60.0 in | Wt 84.4 lb

## 2021-08-20 DIAGNOSIS — C3492 Malignant neoplasm of unspecified part of left bronchus or lung: Secondary | ICD-10-CM

## 2021-08-20 DIAGNOSIS — I890 Lymphedema, not elsewhere classified: Secondary | ICD-10-CM | POA: Insufficient documentation

## 2021-08-20 DIAGNOSIS — C3490 Malignant neoplasm of unspecified part of unspecified bronchus or lung: Secondary | ICD-10-CM

## 2021-08-20 DIAGNOSIS — C3411 Malignant neoplasm of upper lobe, right bronchus or lung: Secondary | ICD-10-CM | POA: Diagnosis not present

## 2021-08-20 DIAGNOSIS — C50412 Malignant neoplasm of upper-outer quadrant of left female breast: Secondary | ICD-10-CM | POA: Insufficient documentation

## 2021-08-20 DIAGNOSIS — C3412 Malignant neoplasm of upper lobe, left bronchus or lung: Secondary | ICD-10-CM | POA: Diagnosis not present

## 2021-08-20 DIAGNOSIS — Z170 Estrogen receptor positive status [ER+]: Secondary | ICD-10-CM | POA: Insufficient documentation

## 2021-08-20 NOTE — Assessment & Plan Note (Addendum)
09/02/2018: Lung nodule evaluation of her former smoker with shortness of breath during hospitalization,  PET CT,  rightupper lobe nodule 1.2 x 1.1 cm SUV 7.6 biopsy revealed adenocarcinomapositive for TTF-1 and Napsin A, left upper lobe nodule 2.5 x 2.1 cm SUV 9.8, no lymph node hypermetabolism, biopsy revealed squamous cellcarcinoma positive for p63 and CK 5 and TTF-1 is negative  Stereotactic radiosurgery April 2020 CT chest 07/28/2021: Stable postradiation fibrosis.  Aortic atherosclerosis.  Emphysema, severe atrophy of left kidney No evidence of lung cancer.

## 2021-08-20 NOTE — Assessment & Plan Note (Signed)
Left lumpectomy [name]): IDC grade 1, 1.1 minutes, ADH, LCIS, margins negative, 0/3 lymph nodes, ER 100%, PR 90%, HER-2 negative, Ki-67 10%, T1 cN0 stage IA, Oncotype DX score 14, 9% ROR Adjuvant radiation therapy completed 11/28/2015 Current treatment: Tamoxifen started 12/09/2015 Tamoxifen toxicities:  Recommended discontinuation of tamoxifen therapy at this time having completed 5 years. Breast cancer surveillance: Mammogram 12/15/2020: Benign breast density category C

## 2021-08-23 ENCOUNTER — Telehealth: Payer: Self-pay | Admitting: *Deleted

## 2021-08-23 NOTE — Telephone Encounter (Signed)
Returned patient's phone call, spoke with patient 

## 2021-08-27 ENCOUNTER — Ambulatory Visit: Payer: Medicare Other | Admitting: Radiation Oncology

## 2021-08-28 ENCOUNTER — Ambulatory Visit (INDEPENDENT_AMBULATORY_CARE_PROVIDER_SITE_OTHER): Payer: Medicare Other | Admitting: Gastroenterology

## 2021-08-28 ENCOUNTER — Encounter: Payer: Self-pay | Admitting: Gastroenterology

## 2021-08-28 ENCOUNTER — Other Ambulatory Visit (INDEPENDENT_AMBULATORY_CARE_PROVIDER_SITE_OTHER): Payer: Medicare Other

## 2021-08-28 VITALS — BP 106/66 | HR 92 | Ht 60.0 in | Wt 83.0 lb

## 2021-08-28 DIAGNOSIS — K625 Hemorrhage of anus and rectum: Secondary | ICD-10-CM | POA: Diagnosis not present

## 2021-08-28 DIAGNOSIS — R634 Abnormal weight loss: Secondary | ICD-10-CM

## 2021-08-28 LAB — COMPREHENSIVE METABOLIC PANEL
ALT: 12 U/L (ref 0–35)
AST: 19 U/L (ref 0–37)
Albumin: 4 g/dL (ref 3.5–5.2)
Alkaline Phosphatase: 51 U/L (ref 39–117)
BUN: 16 mg/dL (ref 6–23)
CO2: 37 mEq/L — ABNORMAL HIGH (ref 19–32)
Calcium: 9.3 mg/dL (ref 8.4–10.5)
Chloride: 98 mEq/L (ref 96–112)
Creatinine, Ser: 0.8 mg/dL (ref 0.40–1.20)
GFR: 72.11 mL/min (ref >60.00)
Glucose, Bld: 106 mg/dL — ABNORMAL HIGH (ref 70–99)
Potassium: 4.1 mEq/L (ref 3.5–5.1)
Sodium: 139 mEq/L (ref 135–145)
Total Bilirubin: 0.4 mg/dL (ref 0.2–1.2)
Total Protein: 6.4 g/dL (ref 6.0–8.3)

## 2021-08-28 LAB — CBC
HCT: 35.1 % — ABNORMAL LOW (ref 36.0–46.0)
Hemoglobin: 11.4 g/dL — ABNORMAL LOW (ref 12.0–15.0)
MCHC: 32.5 g/dL (ref 30.0–36.0)
MCV: 93.1 fl (ref 78.0–100.0)
Platelets: 172 10*3/uL (ref 150.0–400.0)
RBC: 3.77 Mil/uL — ABNORMAL LOW (ref 3.87–5.11)
RDW: 15.6 % — ABNORMAL HIGH (ref 11.5–15.5)
WBC: 6.8 10*3/uL (ref 4.0–10.5)

## 2021-08-28 NOTE — Patient Instructions (Addendum)
If you are age 76 or older, your body mass index should be between 23-30. Your Body mass index is 16.21 kg/m. If this is out of the aforementioned range listed, please consider follow up with your Primary Care Provider.  ________________________________________________________  The [place] [facility] providers would like to encourage you to use [facility] to communicate with providers for non-urgent requests or questions.  Due to long hold times on the telephone, sending your provider a message by [facility] may be a faster and more efficient way to get a response.  Please allow 48 business hours for a response.  Please remember that this is for non-urgent requests.  _______________________________________________________  Your provider has requested that you go to the basement level for lab work before leaving today. Press "B" on the elevator. The lab is located at the first door on the left as you exit the elevator.  You have been scheduled for a colonoscopy. Please follow written instructions given to you at your visit today.  Please pick up your prep supplies at the pharmacy within the next 1-3 days. If you use inhalers (even only as needed), please bring them with you on the day of your procedure.  Due to recent changes in healthcare laws, you may see the results of your imaging and laboratory studies on MyChart before your provider has had a chance to review them.  We understand that in some cases there may be results that are confusing or concerning to you. Not all laboratory results come back in the same time frame and the provider may be waiting for multiple results in order to interpret others.  Please give [place] 48 hours in order for your provider to thoroughly review all the results before contacting the office for clarification of your results.   Thank you for entrusting me with your care and choosing [facility].  Dr Ardis Hughs

## 2021-08-28 NOTE — Progress Notes (Signed)
Review of pertinent gastrointestinal problems:  1. routine risk for colon cancer. Colonoscopy 2001 was normal. Colonoscopy by Dr. Ardis Hughs 2012, may found a small hyperplastic polyp. Otherwise normal. Recall colonoscopy at 10 year interval.  2. IDA: June 2013 persistently Hemoccult negative, celiac sprue workup was negative by blood test. Her hemoglobin was 8.7, her MCV 64.1. Iron level 16, ferritin 5.  02/2012 given IDA, CT scan and CXR done to check for occult malig; CT suggested soft tissue mass in pancreas however MRI followup 02/2012 cleared the pancreas. CXR was normal.  HPI: This is a very pleasant 76 year old woman whom I last saw 8 years ago She was last here in our office about 8 years ago.  At that time we discussed some positional right-sided chest pain and right groin burning.  It did not seem that either of her discomforts were related to her gastrointestinal tract and I recommend she discuss both of her discomforts with her primary care physician.  Her weight is down 13 pounds since her last office visit here 8 years ago.  Clinically she is quite a bit more debilitated than she was 8 years ago.  She has been diagnosed with breast cancer since then and she has been diagnosed with lung cancer.  She has severe COPD.  She is on 3 L of oxygen nasal cannula 24 hours a day.    She has noticed a change in her bowels with minor rectal bleeding and some mucus at times as well as 6 pound weight loss in the past 6 or 8 months.  She has been very gassy on and off.  She has had no abdominal pains  Colon cancer does not run in her family   Review of systems: Pertinent positive and negative review of systems were noted in the above HPI section. All other review negative.   Past Medical History:  Diagnosis Date   Anemia    Aortic arch anomaly    arteria lusoria    Breast cancer ([place]) 09/22/2015   Malignant   Breast cancer of upper-outer quadrant of left female breast ([place]) 09/08/2015    Cataract, immature    CHF (congestive heart failure) ([place])    Acute CHF-06/2018   COPD (chronic obstructive pulmonary disease) ([place])    Femur fracture, left ([place]) 04/11/2020   left femur fracture( greater trochanter   Heart murmur    states no known problems   History of hyperthyroidism    History of radiation therapy    bilateral lungs - 10/27/18-11/02/18, Dr. [name]   History of radiation therapy 11/28/2015   left breast 10/30/2015-11/28/2015   Dr [name]   Hyperlipidemia    Hypertension    states under control with meds., has been on med. x "long time"   Hypokalemia    from last physical.    Hypothyroidism    Nonfunctioning kidney    left   Personal history of radiation therapy    2017   Pulmonary nodules    Bilateral   Radiation 10/30/15-11/28/15   left breast 42.72 Gy, boosted to 10 Gy   Renal artery stenosis (HCC)    Tobacco abuse    Wears partial dentures    upper and lower    Past Surgical History:  Procedure Laterality Date   ABDOMINAL ANGIOGRAM  02/18/2012   Procedure: ABDOMINAL ANGIOGRAM;  Surgeon: Lorretta Harp, MD;  Location: [facility] CATH LAB;  Service: Cardiovascular;;   ABDOMINAL AORTAGRAM  07/04/2014   ABDOMINAL HYSTERECTOMY  ~ 1977  partial   APPENDECTOMY     ARCH AORTOGRAM     BREAST LUMPECTOMY Left 09/22/2015   Malignant   CAROTID ANGIOGRAM N/A 02/18/2012   Procedure: CAROTID ANGIOGRAM;  Surgeon: Lorretta Harp, MD;  Location: [facility] CATH LAB;  Service: Cardiovascular;  Laterality: N/A;   ENDARTERECTOMY  04/02/2012   Procedure: ENDARTERECTOMY CAROTID;  Surgeon: Serafina Mitchell, MD;  Location: [place];  Service: Vascular;  Laterality: Right;   FUDUCIAL PLACEMENT Bilateral 09/02/2018   Procedure: Placement Of Fiducial to right upper lobe & left upper lobe lung;  Surgeon: [name], MD;  Location: [place];  Service: Thoracic;  Laterality: Bilateral;   IR THORACENTESIS ASP PLEURAL SPACE W/IMG GUIDE  07/08/2018   RADIOACTIVE SEED GUIDED PARTIAL  MASTECTOMY WITH AXILLARY SENTINEL LYMPH NODE BIOPSY Left 09/22/2015   Procedure: INJECT BLUE DYE LEFT BREAST,RADIOACTIVE SEED GUIDED PARTIAL MASTECTOMY WITH AXILLARY SENTINEL LYMPH NODE BIOPSY;  Surgeon: Fanny Skates, MD;  Location: [place];  Service: General;  Laterality: Left;   RENAL ANGIOGRAM Left 06/08/2010   renal artery stent -  5x12 Genesis on Aviator balloon stent (Dr. Adora Fridge)   RENAL ANGIOGRAM Right 07/04/2014   Procedure: RENAL ANGIOGRAM;  Surgeon: Lorretta Harp, MD;  Location: [facility] CATH LAB;  Service: Cardiovascular;  Laterality: Right;   RENAL ANGIOGRAM Right 08/22/2014   Procedure: RENAL ANGIOGRAM;  Surgeon: Lorretta Harp, MD;  [facility] CATH LAB;  Service: Cardiovascular;  Laterality: Right;   TONSILLECTOMY     as a child   VIDEO BRONCHOSCOPY WITH ENDOBRONCHIAL NAVIGATION N/A 09/02/2018   Procedure: VIDEO BRONCHOSCOPY WITH ENDOBRONCHIAL NAVIGATION;  Surgeon: [name], MD;  Location: [facility] OR;  Service: Thoracic;  Laterality: N/A;    Current Outpatient Medications  Medication Sig Dispense Refill   albuterol (VENTOLIN HFA) 108 (90 Base) MCG/ACT inhaler Inhale 2 puffs into the lungs every 6 (six) hours as needed for wheezing or shortness of breath. 8 g 3   aspirin 81 MG tablet Take 1 tablet (81 mg total) by mouth daily.     Budeson-Glycopyrrol-Formoterol (BREZTRI AEROSPHERE) 160-9-4.8 MCG/ACT AERO Inhale 2 puffs into the lungs in the morning and at bedtime. 10.7 g 3   doxazosin (CARDURA) 8 MG tablet Take 1 tablet by mouth daily.     furosemide (LASIX) 20 MG tablet Take 1 tablet (20 mg total) by mouth daily. 30 tablet 0   levothyroxine (SYNTHROID) 88 MCG tablet Take 88 mcg by mouth daily before breakfast.     metoprolol tartrate (LOPRESSOR) 25 MG tablet Take 25 mg by mouth daily.     OXYGEN Inhale 2-4 L into the lungs as needed. As needed to maintain SATS > 90%     pantoprazole (PROTONIX) 40 MG tablet Take 1 tablet (40 mg total) by mouth daily. 30 tablet  0   rosuvastatin (CRESTOR) 10 MG tablet Take 10 mg by mouth daily.     No current facility-administered medications for this visit.    Allergies as of 08/28/2021   (No Known Allergies)    Family History  Problem Relation Age of Onset   Heart disease Mother        MI @ 69, died at 34   Cancer Father    Lung cancer Father    Heart disease Maternal Grandmother    Lung cancer Sister    Breast cancer Paternal Aunt     Social History   Socioeconomic History   Marital status: Divorced    Spouse name: Not  on file   Number of children: 0   Years of education: College   Highest education level: Bachelor's degree (e.g., BA, AB, BS)  Occupational History   Occupation: HR Technical brewer: [employer]  Tobacco Use   Smoking status: Every Day    Packs/day: 0.50    Years: 40.00    Pack years: 20.00    Types: Cigarettes   Smokeless tobacco: Never   Tobacco comments:    15 cigarettes smoked daily [facility]J 04/19/21  Vaping Use   Vaping Use: Never used  Substance and Sexual Activity   Alcohol use: Yes    Comment: occasionally   Drug use: No   Sexual activity: Never  Other Topics Concern   Not on file  Social History Narrative   Not on file   Social Determinants of Health   Financial Resource Strain: Not on file  Food Insecurity: Not on file  Transportation Needs: Not on file  Physical Activity: Not on file  Stress: Not on file  Social Connections: Not on file  Intimate Partner Violence: Not on file     Physical Exam: BP 106/66    Pulse 92    Ht 5' (1.524 m)    Wt 83 lb (37.6 kg)    BMI 16.21 kg/m  Constitutional: Frail, sitting in wheelchair, nasal cannula oxygen, fairly cachectic appearing Psychiatric: alert and oriented x3 Eyes: extraocular movements intact Mouth: oral pharynx moist, no lesions Neck: supple no lymphadenopathy Cardiovascular: heart regular rate and rhythm Lungs: clear to auscultation bilaterally Abdomen: soft, nontender, nondistended,  no obvious ascites, no peritoneal signs, normal bowel sounds Extremities: no lower extremity edema bilaterally Skin: no lesions on visible extremities Rectal exam deferred for upcoming colonoscopy   Assessment and plan: 76 y.o. female with frailty, debility, severe COPD, change in bowel habits, weight loss, rectal bleeding  She is concerned about the possibility of having underlying colon cancer.  I tried to explain to her that I think it is unlikely however certainly with her rectal bleeding, weight loss chances are probably higher than usual.  I recommended colonoscopy at her soonest convenience and she understands that this will be high risk given her multiple comorbid conditions and that if she does in the hospital setting rather than an outpatient ambulatory surgical center.  She will have a basic set of labs today as well including CBC and complete metabolic profile.   Please see the "Patient Instructions" section for addition details about the plan.   Owens Loffler, MD [facility] 08/28/2021, 1:42 PM  Cc: Merrilee Seashore, MD  Total time on date of encounter was 45  minutes (this included time spent preparing to see the patient reviewing records; obtaining and/or reviewing separately obtained history; performing a medically appropriate exam and/or evaluation; counseling and educating the patient and family if present; ordering medications, tests or procedures if applicable; and documenting clinical information in the health record).

## 2021-09-05 NOTE — Progress Notes (Incomplete)
Radiation Oncology         [phone] ________________________________  Name: Kendra Watts MRN: [id number]  Date: 09/06/2021  DOB: 06/23/1946  Follow-Up Visit Note  CC: Merrilee Seashore, MD  Nicholas Lose, MD  No diagnosis found.  Diagnosis:   1. 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 Stage IA (pT1c, pN0) left breast invasive ductal carcinoma, ER+ Ysidro Evert /Her2-   Interval Since Last Radiation: 2 years, 10 months, and 4 days   Radiation Treatment Dates/ Dose/Site/Rate: 1. 10/27/2018, 10/29/2018, 11/02/2018: Left and Right Lung targets were each treated to 54 Gy in 3 fractions   2. 10/30/2015 - 11/28/2015: Left Breast with Boost for a total of 52.72 Gy in 21 fractions  Narrative:  The patient returns today for routine follow-up, she was last seen here for follow up on 02/12/21. Since her last visit, the patient followed up with Dr. Lamonte Sakai on 04/19/21. During this visit, the patient was noted to have developed thrush. Per Dr. Lamonte Sakai, this could have been from breztri, and although this seemed to work well for her, Dr. Lamonte Sakai recommended switching her Rx. Otherwise, the patient did not report any new symptoms, and was instructed to continue wearing her oxygen at all times (currently on 3L).    Pertinent imaging thus far includes a chest CT on 07/27/21 which demonstrated stable areas of postradiation fibrosis in the upper lobes of the lungs bilaterally, and no evidence of locally recurrent disease or definite metastatic disease in the thorax. Other notable findings included diffuse bronchial wall thickening with mild centrilobular and paraseptal emphysema suggestive of underlying COPD, aortic atherosclerosis, and left main and three-vessel coronary artery disease.     Of note: the patient has completed  tamoxifen therapy (5 years completed).          ***      Allergies:  has No Known Allergies.  Meds: Current Outpatient Medications  Medication Sig Dispense Refill   albuterol (VENTOLIN HFA) 108 (90 Base) MCG/ACT inhaler Inhale 2 puffs into the lungs every 6 (six) hours as needed for wheezing or shortness of breath. 8 g 3   aspirin 81 MG tablet Take 1 tablet (81 mg total) by mouth daily.     Budeson-Glycopyrrol-Formoterol (BREZTRI AEROSPHERE) 160-9-4.8 MCG/ACT AERO Inhale 2 puffs into the lungs in the morning and at bedtime. 10.7 g 3   doxazosin (CARDURA) 8 MG tablet Take 1 tablet by mouth daily.     furosemide (LASIX) 20 MG tablet Take 1 tablet (20 mg total) by mouth daily. 30 tablet 0   levothyroxine (SYNTHROID) 88 MCG tablet Take 88 mcg by mouth daily before breakfast.     metoprolol tartrate (LOPRESSOR) 25 MG tablet Take 25 mg by mouth daily.     OXYGEN Inhale 2-4 L into the lungs as needed. As needed to maintain SATS > 90%     pantoprazole (PROTONIX) 40 MG tablet Take 1 tablet (40 mg total) by mouth daily. 30 tablet 0   rosuvastatin (CRESTOR) 10 MG tablet Take 10 mg by mouth daily.     No current facility-administered medications for this encounter.    Physical Findings: The patient is in no acute distress. Patient is  alert and oriented.  vitals were not taken for this visit. .  No significant changes. Lungs are clear to auscultation bilaterally. Heart has regular rate and rhythm. No palpable cervical, supraclavicular, or axillary adenopathy. Abdomen soft, non-tender, normal bowel sounds. ***   Lab Findings: Lab Results  Component Value Date   WBC 6.8 08/28/2021   HGB 11.4 (L) 08/28/2021   HCT 35.1 (L) 08/28/2021   MCV 93.1 08/28/2021   PLT 172.0 08/28/2021    Radiographic Findings: No results found.  Impression:   1. 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 Stage IA (pT1c, pN0) left breast invasive ductal carcinoma, ER+ Ysidro Evert /Her2-     The patient is recovering from the effects of radiation.  ***  Plan:  ***   *** minutes of total time was spent for this patient encounter, including preparation, face-to-face counseling with the patient and coordination of care, physical exam, and documentation of the encounter. ____________________________________  Blair Promise, PhD, MD   This document serves as a record of services personally performed by [name], MD. It was created on his behalf by Roney Mans, a trained medical scribe. The creation of this record is based on the scribe's personal observations and the provider's statements to them. This document has been checked and approved by the attending provider.

## 2021-09-06 ENCOUNTER — Telehealth: Payer: Self-pay | Admitting: *Deleted

## 2021-09-06 ENCOUNTER — Ambulatory Visit
Admission: RE | Admit: 2021-09-06 | Discharge: 2021-09-06 | Disposition: A | Discharge status: Home / Self Care | Payer: Medicare Other | Source: Ambulatory Visit | Attending: Radiation Oncology | Admitting: Radiation Oncology

## 2021-09-06 NOTE — Telephone Encounter (Signed)
RETURNED PATIENT'S PHONE CALL, SPOKE WITH PATIENT FU FOR 09-06-21 HAS BEEN RESCHEDULED FOR 10-08-21 @ 3:45 PM, PATIENT AGREED TO NEW DATE AND TIME

## 2021-09-19 DIAGNOSIS — E440 Moderate protein-calorie malnutrition: Secondary | ICD-10-CM | POA: Diagnosis not present

## 2021-09-19 DIAGNOSIS — R634 Abnormal weight loss: Secondary | ICD-10-CM | POA: Diagnosis not present

## 2021-09-21 ENCOUNTER — Other Ambulatory Visit: Payer: Self-pay

## 2021-09-21 ENCOUNTER — Telehealth: Payer: Self-pay | Admitting: Gastroenterology

## 2021-09-21 DIAGNOSIS — R634 Abnormal weight loss: Secondary | ICD-10-CM

## 2021-09-21 DIAGNOSIS — K625 Hemorrhage of anus and rectum: Secondary | ICD-10-CM

## 2021-09-21 NOTE — Telephone Encounter (Signed)
Dr Ardis Hughs please advise the pt does not think she should go thru with the upcoming hospital colon due to her cardiac status.  She prefers to have CT scan.  Is this a reasonable option? ?

## 2021-09-21 NOTE — Telephone Encounter (Signed)
I have cancelled the hospital case and CT ordered and sent to the schedulers.  The pt has been advised and will call if she has not heard from the schedulers in 1 week.   ?

## 2021-09-21 NOTE — Telephone Encounter (Signed)
Inbound call from patient states that she would like to cancel procedure on 3/23 at 11:15 due to her having heart failure and would rather do a CT scan. Please advise.  ?

## 2021-10-04 ENCOUNTER — Ambulatory Visit (HOSPITAL_COMMUNITY): Payer: Medicare Other

## 2021-10-07 NOTE — Progress Notes (Incomplete)
?Radiation Oncology         [phone] ?________________________________ ? ?Name: Kendra Watts MRN: [id number]  ?Date: 10/08/2021  DOB: 1945/09/04 ? ?Follow-Up Visit Note ? ?CC: Merrilee Seashore, MD  Nicholas Lose, MD ? ?No diagnosis found. ? ?Diagnosis:   ?1. Synchronous bilateral stage 1 lung cancers ?            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  ?2. Stage IA (pT1c, pN0) left breast invasive ductal carcinoma, ER+ Ysidro Evert /Her2- ?  ?Interval Since Last Radiation: 2 years, 11 months, and 7 days  ? ?Radiation Treatment Dates/ Dose/Site/Rate: ?1. 10/27/2018, 10/29/2018, 11/02/2018: Left and Right Lung targets were each treated to 54 Gy in 3 fractions ?  ?2. 10/30/2015 - 11/28/2015: Left Breast with Boost for a total of 52.72 Gy in 21 fractions ? ?Narrative:  The patient returns today for routine follow-up, she was last seen here for follow up on 02/12/21. Since her last visit, the patient followed up with Dr. Lamonte Sakai on 04/19/21. During this visit, the patient was noted to have developed thrush. Per Dr. Lamonte Sakai, this could have been from breztri, and although this seemed to work well for her, Dr. Lamonte Sakai recommended switching her Rx. Otherwise, the patient did not report any new symptoms, and was instructed to continue wearing her oxygen at all times (currently on 3L).   ? ?Pertinent imaging thus far includes a chest CT on 07/27/21 which demonstrated stable areas of postradiation fibrosis in the upper lobes of the lungs bilaterally, and no evidence of locally recurrent disease or definite metastatic disease in the thorax. Other notable findings included diffuse bronchial wall thickening with mild centrilobular and ?paraseptal emphysema suggestive of underlying COPD, aortic atherosclerosis, and left main and three-vessel coronary artery disease.    ? ?Of note: the patient has completed  tamoxifen therapy (5 years completed).         ? ?***     ? ?Allergies:  has No Known Allergies. ? ?Meds: ?Current Outpatient Medications  ?Medication Sig Dispense Refill  ? albuterol (VENTOLIN HFA) 108 (90 Base) MCG/ACT inhaler Inhale 2 puffs into the lungs every 6 (six) hours as needed for wheezing or shortness of breath. 8 g 3  ? aspirin 81 MG tablet Take 1 tablet (81 mg total) by mouth daily.    ? Budeson-Glycopyrrol-Formoterol (BREZTRI AEROSPHERE) 160-9-4.8 MCG/ACT AERO Inhale 2 puffs into the lungs in the morning and at bedtime. 10.7 g 3  ? doxazosin (CARDURA) 8 MG tablet Take 1 tablet by mouth daily.    ? furosemide (LASIX) 20 MG tablet Take 1 tablet (20 mg total) by mouth daily. 30 tablet 0  ? levothyroxine (SYNTHROID) 88 MCG tablet Take 88 mcg by mouth daily before breakfast.    ? metoprolol tartrate (LOPRESSOR) 25 MG tablet Take 25 mg by mouth daily.    ? OXYGEN Inhale 2-4 L into the lungs as needed. As needed to maintain SATS > 90%    ? pantoprazole (PROTONIX) 40 MG tablet Take 1 tablet (40 mg total) by mouth daily. 30 tablet 0  ? rosuvastatin (CRESTOR) 10 MG tablet Take 10 mg by mouth daily.    ? ?No current facility-administered medications for this encounter.  ? ? ?Physical Findings: ?The patient is in no acute distress. Patient is  alert and oriented. ? vitals were not taken for this visit. .  No significant changes. Lungs are clear to auscultation bilaterally. Heart has regular rate and rhythm. No palpable cervical, supraclavicular, or axillary adenopathy. Abdomen soft, non-tender, normal bowel sounds. *** ? ? ?Lab Findings: ?Lab Results  ?Component Value Date  ? WBC 6.8 08/28/2021  ? HGB 11.4 (L) 08/28/2021  ? HCT 35.1 (L) 08/28/2021  ? MCV 93.1 08/28/2021  ? PLT 172.0 08/28/2021  ? ? ?Radiographic Findings: ?No results found. ? ?Impression:   ?1. Synchronous bilateral stage 1 lung cancers ?            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  ?2. Stage IA (pT1c, pN0) left breast invasive ductal carcinoma, ER+ Ysidro Evert /Her2- ?  ? ? ?The patient is recovering from the effects of radiation.  *** ? ?Plan:  *** ? ? ?*** minutes of total time was spent for this patient encounter, including preparation, face-to-face counseling with the patient and coordination of care, physical exam, and documentation of the encounter. ?____________________________________ ? ?Blair Promise, PhD, MD ? ? ?This document serves as a record of services personally performed by [name], MD. It was created on his behalf by Roney Mans, a trained medical scribe. The creation of this record is based on the scribe's personal observations and the provider's statements to them. This document has been checked and approved by the attending provider. ? ?

## 2021-10-08 ENCOUNTER — Ambulatory Visit
Admission: RE | Admit: 2021-10-08 | Discharge: 2021-10-08 | Disposition: A | Discharge status: Home / Self Care | Payer: Medicare Other | Source: Ambulatory Visit | Attending: Radiation Oncology | Admitting: Radiation Oncology

## 2021-10-08 DIAGNOSIS — C3491 Malignant neoplasm of unspecified part of right bronchus or lung: Secondary | ICD-10-CM

## 2021-10-09 ENCOUNTER — Telehealth: Payer: Self-pay | Admitting: *Deleted

## 2021-10-09 NOTE — Telephone Encounter (Signed)
Called patient to ask about rescheduling fu for 10-08-21, lvm for a return call ?

## 2021-10-11 ENCOUNTER — Encounter (HOSPITAL_COMMUNITY): Payer: Self-pay

## 2021-10-11 ENCOUNTER — Ambulatory Visit (HOSPITAL_COMMUNITY): Admit: 2021-10-11 | Payer: Medicare Other | Admitting: Gastroenterology

## 2021-10-11 SURGERY — COLONOSCOPY WITH PROPOFOL
Anesthesia: Monitor Anesthesia Care

## 2021-10-16 ENCOUNTER — Ambulatory Visit (HOSPITAL_COMMUNITY)
Admission: RE | Admit: 2021-10-16 | Discharge: 2021-10-16 | Disposition: A | Discharge status: Home / Self Care | Payer: Medicare Other | Source: Ambulatory Visit | Attending: Gastroenterology | Admitting: Gastroenterology

## 2021-10-16 ENCOUNTER — Other Ambulatory Visit: Payer: Self-pay

## 2021-10-16 ENCOUNTER — Encounter (HOSPITAL_COMMUNITY): Payer: Self-pay

## 2021-10-16 DIAGNOSIS — M47816 Spondylosis without myelopathy or radiculopathy, lumbar region: Secondary | ICD-10-CM | POA: Diagnosis not present

## 2021-10-16 DIAGNOSIS — R634 Abnormal weight loss: Secondary | ICD-10-CM | POA: Diagnosis not present

## 2021-10-16 DIAGNOSIS — K625 Hemorrhage of anus and rectum: Secondary | ICD-10-CM | POA: Insufficient documentation

## 2021-10-16 DIAGNOSIS — I700 Atherosclerosis of aorta: Secondary | ICD-10-CM | POA: Diagnosis not present

## 2021-10-16 DIAGNOSIS — K3189 Other diseases of stomach and duodenum: Secondary | ICD-10-CM | POA: Diagnosis not present

## 2021-10-16 MED ORDER — IOHEXOL 300 MG/ML  SOLN
80.0000 mL | Freq: Once | INTRAMUSCULAR | Status: AC | PRN
  Administered 2021-10-16: 80 mL via INTRAVENOUS

## 2021-10-16 MED ORDER — SODIUM CHLORIDE (PF) 0.9 % IJ SOLN
INTRAMUSCULAR | Status: AC
  Filled 2021-10-16: qty 50

## 2021-10-23 ENCOUNTER — Telehealth: Payer: Self-pay | Admitting: Gastroenterology

## 2021-10-23 NOTE — Telephone Encounter (Signed)
Inbound call from patient. Returned call following results. Requesting if instead of an OV, could she have a phone call to discuss results and how she is feeling as her follow up. States she have no one to help her and have no transportation ?

## 2021-10-25 NOTE — Telephone Encounter (Signed)
Patient aware that she has been scheduled for a MyChart video visit on 11-14-21 at 10:10am with Dr Ardis Hughs.  Patient agreed to plan and verbalized understanding.  No further questions.  ?

## 2021-10-25 NOTE — Telephone Encounter (Signed)
Kendra Banister, MD  Stevan Born, CMA ?Certainly, can you please arrange a telemedicine visit.  My first available ROV spot.  Thanks  ?

## 2021-11-08 DIAGNOSIS — Z20822 Contact with and (suspected) exposure to covid-19: Secondary | ICD-10-CM | POA: Diagnosis not present

## 2021-11-12 ENCOUNTER — Other Ambulatory Visit: Payer: Self-pay

## 2021-11-12 ENCOUNTER — Inpatient Hospital Stay (HOSPITAL_COMMUNITY)
Admission: EM | Admit: 2021-11-12 | Discharge: 2021-11-25 | DRG: 064 | Disposition: A | Discharge status: Skilled Nursing Facility | Payer: Medicare Other | Attending: Family Medicine | Admitting: Family Medicine

## 2021-11-12 ENCOUNTER — Emergency Department (HOSPITAL_COMMUNITY): Payer: Medicare Other

## 2021-11-12 ENCOUNTER — Inpatient Hospital Stay (HOSPITAL_COMMUNITY): Payer: Medicare Other

## 2021-11-12 DIAGNOSIS — N3001 Acute cystitis with hematuria: Secondary | ICD-10-CM | POA: Diagnosis not present

## 2021-11-12 DIAGNOSIS — J9601 Acute respiratory failure with hypoxia: Secondary | ICD-10-CM | POA: Diagnosis not present

## 2021-11-12 DIAGNOSIS — R29734 NIHSS score 34: Secondary | ICD-10-CM | POA: Diagnosis present

## 2021-11-12 DIAGNOSIS — I110 Hypertensive heart disease with heart failure: Secondary | ICD-10-CM | POA: Diagnosis present

## 2021-11-12 DIAGNOSIS — G9341 Metabolic encephalopathy: Secondary | ICD-10-CM | POA: Diagnosis not present

## 2021-11-12 DIAGNOSIS — I619 Nontraumatic intracerebral hemorrhage, unspecified: Secondary | ICD-10-CM | POA: Diagnosis not present

## 2021-11-12 DIAGNOSIS — J411 Mucopurulent chronic bronchitis: Secondary | ICD-10-CM | POA: Diagnosis not present

## 2021-11-12 DIAGNOSIS — I5032 Chronic diastolic (congestive) heart failure: Secondary | ICD-10-CM | POA: Diagnosis not present

## 2021-11-12 DIAGNOSIS — J9621 Acute and chronic respiratory failure with hypoxia: Secondary | ICD-10-CM | POA: Diagnosis not present

## 2021-11-12 DIAGNOSIS — I4891 Unspecified atrial fibrillation: Secondary | ICD-10-CM | POA: Diagnosis not present

## 2021-11-12 DIAGNOSIS — F05 Delirium due to known physiological condition: Secondary | ICD-10-CM | POA: Diagnosis not present

## 2021-11-12 DIAGNOSIS — E039 Hypothyroidism, unspecified: Secondary | ICD-10-CM

## 2021-11-12 DIAGNOSIS — R4020 Unspecified coma: Secondary | ICD-10-CM | POA: Diagnosis not present

## 2021-11-12 DIAGNOSIS — J441 Chronic obstructive pulmonary disease with (acute) exacerbation: Secondary | ICD-10-CM | POA: Diagnosis not present

## 2021-11-12 DIAGNOSIS — I6522 Occlusion and stenosis of left carotid artery: Secondary | ICD-10-CM | POA: Diagnosis present

## 2021-11-12 DIAGNOSIS — I639 Cerebral infarction, unspecified: Secondary | ICD-10-CM | POA: Diagnosis not present

## 2021-11-12 DIAGNOSIS — C50412 Malignant neoplasm of upper-outer quadrant of left female breast: Secondary | ICD-10-CM | POA: Diagnosis present

## 2021-11-12 DIAGNOSIS — Y92009 Unspecified place in unspecified non-institutional (private) residence as the place of occurrence of the external cause: Secondary | ICD-10-CM

## 2021-11-12 DIAGNOSIS — Z7982 Long term (current) use of aspirin: Secondary | ICD-10-CM

## 2021-11-12 DIAGNOSIS — R000 Tachycardia, unspecified: Secondary | ICD-10-CM | POA: Diagnosis not present

## 2021-11-12 DIAGNOSIS — G936 Cerebral edema: Secondary | ICD-10-CM | POA: Diagnosis not present

## 2021-11-12 DIAGNOSIS — G9340 Encephalopathy, unspecified: Secondary | ICD-10-CM | POA: Diagnosis not present

## 2021-11-12 DIAGNOSIS — W1830XA Fall on same level, unspecified, initial encounter: Secondary | ICD-10-CM | POA: Diagnosis present

## 2021-11-12 DIAGNOSIS — D638 Anemia in other chronic diseases classified elsewhere: Secondary | ICD-10-CM | POA: Diagnosis present

## 2021-11-12 DIAGNOSIS — Z781 Physical restraint status: Secondary | ICD-10-CM

## 2021-11-12 DIAGNOSIS — Z79899 Other long term (current) drug therapy: Secondary | ICD-10-CM

## 2021-11-12 DIAGNOSIS — J90 Pleural effusion, not elsewhere classified: Secondary | ICD-10-CM | POA: Diagnosis not present

## 2021-11-12 DIAGNOSIS — I6200 Nontraumatic subdural hemorrhage, unspecified: Secondary | ICD-10-CM | POA: Diagnosis not present

## 2021-11-12 DIAGNOSIS — Z9981 Dependence on supplemental oxygen: Secondary | ICD-10-CM

## 2021-11-12 DIAGNOSIS — Z4682 Encounter for fitting and adjustment of non-vascular catheter: Secondary | ICD-10-CM | POA: Diagnosis not present

## 2021-11-12 DIAGNOSIS — G40901 Epilepsy, unspecified, not intractable, with status epilepticus: Secondary | ICD-10-CM | POA: Diagnosis present

## 2021-11-12 DIAGNOSIS — J189 Pneumonia, unspecified organism: Secondary | ICD-10-CM | POA: Diagnosis not present

## 2021-11-12 DIAGNOSIS — E43 Unspecified severe protein-calorie malnutrition: Secondary | ICD-10-CM | POA: Diagnosis present

## 2021-11-12 DIAGNOSIS — R4182 Altered mental status, unspecified: Secondary | ICD-10-CM | POA: Diagnosis not present

## 2021-11-12 DIAGNOSIS — I739 Peripheral vascular disease, unspecified: Secondary | ICD-10-CM | POA: Diagnosis present

## 2021-11-12 DIAGNOSIS — I611 Nontraumatic intracerebral hemorrhage in hemisphere, cortical: Secondary | ICD-10-CM | POA: Diagnosis not present

## 2021-11-12 DIAGNOSIS — I10 Essential (primary) hypertension: Secondary | ICD-10-CM | POA: Diagnosis present

## 2021-11-12 DIAGNOSIS — R339 Retention of urine, unspecified: Secondary | ICD-10-CM | POA: Diagnosis not present

## 2021-11-12 DIAGNOSIS — Z66 Do not resuscitate: Secondary | ICD-10-CM | POA: Diagnosis not present

## 2021-11-12 DIAGNOSIS — R569 Unspecified convulsions: Principal | ICD-10-CM

## 2021-11-12 DIAGNOSIS — N3000 Acute cystitis without hematuria: Secondary | ICD-10-CM | POA: Diagnosis not present

## 2021-11-12 DIAGNOSIS — I63213 Cerebral infarction due to unspecified occlusion or stenosis of bilateral vertebral arteries: Secondary | ICD-10-CM | POA: Diagnosis not present

## 2021-11-12 DIAGNOSIS — Z20822 Contact with and (suspected) exposure to covid-19: Secondary | ICD-10-CM | POA: Diagnosis present

## 2021-11-12 DIAGNOSIS — R404 Transient alteration of awareness: Secondary | ICD-10-CM | POA: Diagnosis not present

## 2021-11-12 DIAGNOSIS — G40909 Epilepsy, unspecified, not intractable, without status epilepticus: Secondary | ICD-10-CM | POA: Diagnosis present

## 2021-11-12 DIAGNOSIS — F17210 Nicotine dependence, cigarettes, uncomplicated: Secondary | ICD-10-CM | POA: Diagnosis present

## 2021-11-12 DIAGNOSIS — C3491 Malignant neoplasm of unspecified part of right bronchus or lung: Secondary | ICD-10-CM | POA: Diagnosis not present

## 2021-11-12 DIAGNOSIS — Z801 Family history of malignant neoplasm of trachea, bronchus and lung: Secondary | ICD-10-CM | POA: Diagnosis not present

## 2021-11-12 DIAGNOSIS — I6389 Other cerebral infarction: Secondary | ICD-10-CM | POA: Diagnosis not present

## 2021-11-12 DIAGNOSIS — R0602 Shortness of breath: Secondary | ICD-10-CM | POA: Diagnosis not present

## 2021-11-12 DIAGNOSIS — Z8249 Family history of ischemic heart disease and other diseases of the circulatory system: Secondary | ICD-10-CM | POA: Diagnosis not present

## 2021-11-12 DIAGNOSIS — Z79890 Hormone replacement therapy (postmenopausal): Secondary | ICD-10-CM

## 2021-11-12 DIAGNOSIS — J9611 Chronic respiratory failure with hypoxia: Secondary | ICD-10-CM | POA: Diagnosis not present

## 2021-11-12 DIAGNOSIS — S06360A Traumatic hemorrhage of cerebrum, unspecified, without loss of consciousness, initial encounter: Secondary | ICD-10-CM | POA: Diagnosis not present

## 2021-11-12 DIAGNOSIS — Z9289 Personal history of other medical treatment: Secondary | ICD-10-CM

## 2021-11-12 DIAGNOSIS — J8410 Pulmonary fibrosis, unspecified: Secondary | ICD-10-CM | POA: Diagnosis not present

## 2021-11-12 DIAGNOSIS — I63233 Cerebral infarction due to unspecified occlusion or stenosis of bilateral carotid arteries: Secondary | ICD-10-CM | POA: Diagnosis not present

## 2021-11-12 DIAGNOSIS — F17200 Nicotine dependence, unspecified, uncomplicated: Secondary | ICD-10-CM | POA: Diagnosis present

## 2021-11-12 DIAGNOSIS — Z7951 Long term (current) use of inhaled steroids: Secondary | ICD-10-CM

## 2021-11-12 DIAGNOSIS — E876 Hypokalemia: Secondary | ICD-10-CM | POA: Diagnosis present

## 2021-11-12 DIAGNOSIS — Z803 Family history of malignant neoplasm of breast: Secondary | ICD-10-CM | POA: Diagnosis not present

## 2021-11-12 DIAGNOSIS — R0902 Hypoxemia: Secondary | ICD-10-CM | POA: Diagnosis not present

## 2021-11-12 DIAGNOSIS — C50919 Malignant neoplasm of unspecified site of unspecified female breast: Secondary | ICD-10-CM | POA: Diagnosis not present

## 2021-11-12 DIAGNOSIS — M8448XA Pathological fracture, other site, initial encounter for fracture: Secondary | ICD-10-CM | POA: Diagnosis not present

## 2021-11-12 DIAGNOSIS — E785 Hyperlipidemia, unspecified: Secondary | ICD-10-CM | POA: Diagnosis not present

## 2021-11-12 DIAGNOSIS — D649 Anemia, unspecified: Secondary | ICD-10-CM | POA: Diagnosis present

## 2021-11-12 DIAGNOSIS — Z923 Personal history of irradiation: Secondary | ICD-10-CM | POA: Diagnosis not present

## 2021-11-12 HISTORY — DX: Encephalopathy, unspecified: G93.40

## 2021-11-12 LAB — URINALYSIS, ROUTINE W REFLEX MICROSCOPIC
Bilirubin Urine: NEGATIVE
Glucose, UA: NEGATIVE mg/dL
Hgb urine dipstick: NEGATIVE
Ketones, ur: NEGATIVE mg/dL
Leukocytes,Ua: NEGATIVE
Nitrite: NEGATIVE
Protein, ur: NEGATIVE mg/dL
Specific Gravity, Urine: 1.04 — ABNORMAL HIGH (ref 1.005–1.030)
pH: 5 (ref 5.0–8.0)

## 2021-11-12 LAB — LACTIC ACID, PLASMA
Lactic Acid, Venous: 8.2 mmol/L (ref 0.5–1.9)
Lactic Acid, Venous: 2.1 mmol/L (ref 0.5–1.9)

## 2021-11-12 LAB — CBC WITH DIFFERENTIAL/PLATELET
Abs Immature Granulocytes: 0.07 10*3/uL (ref 0.00–0.07)
Basophils Absolute: 0.1 10*3/uL (ref 0.0–0.1)
Basophils Relative: 1 %
Eosinophils Absolute: 0.1 10*3/uL (ref 0.0–0.5)
Eosinophils Relative: 1 %
HCT: 37.1 % (ref 36.0–46.0)
Hemoglobin: 11.5 g/dL — ABNORMAL LOW (ref 12.0–15.0)
Immature Granulocytes: 1 %
Lymphocytes Relative: 23 %
Lymphs Abs: 2.3 10*3/uL (ref 0.7–4.0)
MCH: 30.2 pg (ref 26.0–34.0)
MCHC: 31 g/dL (ref 30.0–36.0)
MCV: 97.4 fL (ref 80.0–100.0)
Monocytes Absolute: 0.7 10*3/uL (ref 0.1–1.0)
Monocytes Relative: 7 %
Neutro Abs: 6.7 10*3/uL (ref 1.7–7.7)
Neutrophils Relative %: 67 %
Platelets: 201 10*3/uL (ref 150–400)
RBC: 3.81 MIL/uL — ABNORMAL LOW (ref 3.87–5.11)
RDW: 14.9 % (ref 11.5–15.5)
WBC: 10 10*3/uL (ref 4.0–10.5)
nRBC: 0 % (ref 0.0–0.2)

## 2021-11-12 LAB — MAGNESIUM: Magnesium: 1.8 mg/dL (ref 1.7–2.4)

## 2021-11-12 LAB — HEMOGLOBIN A1C
Hgb A1c MFr Bld: 5.6 % (ref 4.8–5.6)
Mean Plasma Glucose: 114.02 mg/dL

## 2021-11-12 LAB — COMPREHENSIVE METABOLIC PANEL
ALT: 19 U/L (ref 0–44)
AST: 30 U/L (ref 15–41)
Albumin: 4 g/dL (ref 3.5–5.0)
Alkaline Phosphatase: 67 U/L (ref 38–126)
Anion gap: 13 (ref 5–15)
BUN: 9 mg/dL (ref 8–23)
CO2: 29 mmol/L (ref 22–32)
Calcium: 8.9 mg/dL (ref 8.9–10.3)
Chloride: 96 mmol/L — ABNORMAL LOW (ref 98–111)
Creatinine, Ser: 0.88 mg/dL (ref 0.44–1.00)
GFR, Estimated: >60 mL/min (ref >60)
Glucose, Bld: 241 mg/dL — ABNORMAL HIGH (ref 70–99)
Potassium: 3.6 mmol/L (ref 3.5–5.1)
Sodium: 138 mmol/L (ref 135–145)
Total Bilirubin: 0.4 mg/dL (ref 0.3–1.2)
Total Protein: 6.7 g/dL (ref 6.5–8.1)

## 2021-11-12 LAB — LIPID PANEL
Cholesterol: 152 mg/dL (ref 0–200)
HDL: 79 mg/dL (ref >40)
LDL Cholesterol: 58 mg/dL (ref 0–99)
Total CHOL/HDL Ratio: 1.9 RATIO
Triglycerides: 74 mg/dL (ref <150)
VLDL: 15 mg/dL (ref 0–40)

## 2021-11-12 LAB — RESP PANEL BY RT-PCR (FLU A&B, COVID) ARPGX2
Influenza A by PCR: NEGATIVE
Influenza B by PCR: NEGATIVE
SARS Coronavirus 2 by RT PCR: NEGATIVE

## 2021-11-12 LAB — I-STAT ARTERIAL BLOOD GAS, ED
Acid-Base Excess: 5 mmol/L — ABNORMAL HIGH (ref 0.0–2.0)
Bicarbonate: 34.6 mmol/L — ABNORMAL HIGH (ref 20.0–28.0)
Calcium, Ion: 1.15 mmol/L (ref 1.15–1.40)
HCT: 34 % — ABNORMAL LOW (ref 36.0–46.0)
Hemoglobin: 11.6 g/dL — ABNORMAL LOW (ref 12.0–15.0)
O2 Saturation: 100 %
Potassium: 3.9 mmol/L (ref 3.5–5.1)
Sodium: 137 mmol/L (ref 135–145)
TCO2: 37 mmol/L — ABNORMAL HIGH (ref 22–32)
pCO2 arterial: 80.9 mmHg (ref 32–48)
pH, Arterial: 7.239 — ABNORMAL LOW (ref 7.35–7.45)
pO2, Arterial: 521 mmHg — ABNORMAL HIGH (ref 83–108)

## 2021-11-12 LAB — CK: Total CK: 85 U/L (ref 38–234)

## 2021-11-12 LAB — MRSA NEXT GEN BY PCR, NASAL: MRSA by PCR Next Gen: NOT DETECTED

## 2021-11-12 LAB — PROTIME-INR
INR: 1 (ref 0.8–1.2)
Prothrombin Time: 13.5 seconds (ref 11.4–15.2)

## 2021-11-12 LAB — RAPID URINE DRUG SCREEN, HOSP PERFORMED
Amphetamines: NOT DETECTED
Barbiturates: NOT DETECTED
Benzodiazepines: POSITIVE — AB
Cocaine: NOT DETECTED
Opiates: NOT DETECTED
Tetrahydrocannabinol: NOT DETECTED

## 2021-11-12 LAB — GLUCOSE, CAPILLARY
Glucose-Capillary: 106 mg/dL — ABNORMAL HIGH (ref 70–99)
Glucose-Capillary: 160 mg/dL — ABNORMAL HIGH (ref 70–99)
Glucose-Capillary: 65 mg/dL — ABNORMAL LOW (ref 70–99)

## 2021-11-12 LAB — TSH: TSH: 0.078 u[IU]/mL — ABNORMAL LOW (ref 0.350–4.500)

## 2021-11-12 LAB — TROPONIN I (HIGH SENSITIVITY)
Troponin I (High Sensitivity): 15 ng/L (ref <18)
Troponin I (High Sensitivity): 34 ng/L — ABNORMAL HIGH (ref <18)

## 2021-11-12 LAB — APTT: aPTT: 26 seconds (ref 24–36)

## 2021-11-12 LAB — CBG MONITORING, ED: Glucose-Capillary: 197 mg/dL — ABNORMAL HIGH (ref 70–99)

## 2021-11-12 MED ORDER — SENNOSIDES-DOCUSATE SODIUM 8.6-50 MG PO TABS
1.0000 | ORAL_TABLET | Freq: Two times a day (BID) | ORAL | Status: DC
Start: 2021-11-12 — End: 2021-11-17
  Administered 2021-11-12 – 2021-11-16 (×7): 1
  Filled 2021-11-12 (×6): qty 1

## 2021-11-12 MED ORDER — ACETAMINOPHEN 650 MG RE SUPP: 650.0000 mg | RECTAL | Status: DC | PRN

## 2021-11-12 MED ORDER — MIDAZOLAM BOLUS VIA INFUSION
0.2000 mg/kg | INTRAVENOUS | Status: DC | PRN
  Administered 2021-11-12: 2 mg via INTRAVENOUS
  Administered 2021-11-12 (×3): 4 mg via INTRAVENOUS
  Filled 2021-11-12: qty 8

## 2021-11-12 MED ORDER — IOHEXOL 350 MG/ML SOLN
60.0000 mL | Freq: Once | INTRAVENOUS | Status: AC | PRN
  Administered 2021-11-12: 60 mL via INTRAVENOUS

## 2021-11-12 MED ORDER — ETOMIDATE 2 MG/ML IV SOLN
INTRAVENOUS | Status: AC | PRN
  Administered 2021-11-12: 20 mg via INTRAVENOUS

## 2021-11-12 MED ORDER — FENTANYL 2500MCG IN NS 250ML (10MCG/ML) PREMIX INFUSION
25.0000 ug/h | INTRAVENOUS | Status: DC
  Administered 2021-11-12: 75 ug/h via INTRAVENOUS
  Administered 2021-11-12: 50 ug/h via INTRAVENOUS
  Filled 2021-11-12: qty 250

## 2021-11-12 MED ORDER — PROPOFOL 1000 MG/100ML IV EMUL
5.0000 ug/kg/min | INTRAVENOUS | Status: DC
  Administered 2021-11-12: 30 ug/kg/min via INTRAVENOUS

## 2021-11-12 MED ORDER — CHLORHEXIDINE GLUCONATE CLOTH 2 % EX PADS
6.0000 | MEDICATED_PAD | Freq: Every day | CUTANEOUS | Status: DC
  Administered 2021-11-12 – 2021-11-19 (×9): 6 via TOPICAL

## 2021-11-12 MED ORDER — LORAZEPAM 2 MG/ML IJ SOLN: 2.0000 mg | Freq: Once | INTRAMUSCULAR | Status: AC

## 2021-11-12 MED ORDER — CHLORHEXIDINE GLUCONATE 0.12% ORAL RINSE (MEDLINE KIT)
15.0000 mL | Freq: Two times a day (BID) | OROMUCOSAL | Status: DC
  Administered 2021-11-12 – 2021-11-16 (×8): 15 mL via OROMUCOSAL

## 2021-11-12 MED ORDER — INSULIN ASPART 100 UNIT/ML IJ SOLN
0.0000 [IU] | INTRAMUSCULAR | Status: DC
  Administered 2021-11-12 – 2021-11-16 (×7): 1 [IU] via SUBCUTANEOUS
  Administered 2021-11-16: 2 [IU] via SUBCUTANEOUS
  Administered 2021-11-18: 1 [IU] via SUBCUTANEOUS
  Administered 2021-11-18: 2 [IU] via SUBCUTANEOUS
  Administered 2021-11-18: 3 [IU] via SUBCUTANEOUS
  Administered 2021-11-18: 2 [IU] via SUBCUTANEOUS
  Administered 2021-11-18 – 2021-11-19 (×3): 1 [IU] via SUBCUTANEOUS
  Administered 2021-11-19: 3 [IU] via SUBCUTANEOUS
  Administered 2021-11-19 – 2021-11-21 (×6): 1 [IU] via SUBCUTANEOUS

## 2021-11-12 MED ORDER — FENTANYL BOLUS VIA INFUSION
25.0000 ug | INTRAVENOUS | Status: DC | PRN
  Administered 2021-11-13: 25 ug via INTRAVENOUS
  Filled 2021-11-12: qty 100

## 2021-11-12 MED ORDER — CLEVIDIPINE BUTYRATE 0.5 MG/ML IV EMUL
0.0000 mg/h | INTRAVENOUS | Status: DC
  Administered 2021-11-12: 1 mg/h via INTRAVENOUS
  Filled 2021-11-12: qty 50

## 2021-11-12 MED ORDER — ACETAMINOPHEN 160 MG/5ML PO SOLN
650.0000 mg | ORAL | Status: DC | PRN
  Administered 2021-11-12 – 2021-11-15 (×4): 650 mg
  Filled 2021-11-12 (×4): qty 20.3

## 2021-11-12 MED ORDER — STROKE: EARLY STAGES OF RECOVERY BOOK
Freq: Once | Status: AC
  Filled 2021-11-12: qty 1

## 2021-11-12 MED ORDER — PANTOPRAZOLE SODIUM 40 MG IV SOLR
40.0000 mg | Freq: Every day | INTRAVENOUS | Status: DC
  Administered 2021-11-12 – 2021-11-13 (×2): 40 mg via INTRAVENOUS
  Filled 2021-11-12 (×2): qty 10

## 2021-11-12 MED ORDER — SENNOSIDES-DOCUSATE SODIUM 8.6-50 MG PO TABS
1.0000 | ORAL_TABLET | Freq: Two times a day (BID) | ORAL | Status: DC
  Filled 2021-11-12: qty 1

## 2021-11-12 MED ORDER — SUCCINYLCHOLINE CHLORIDE 20 MG/ML IJ SOLN
INTRAMUSCULAR | Status: AC | PRN
  Administered 2021-11-12: 100 mg via INTRAVENOUS

## 2021-11-12 MED ORDER — DEXTROSE 50 % IV SOLN
12.5000 g | INTRAVENOUS | Status: AC
  Administered 2021-11-12: 12.5 g via INTRAVENOUS
  Filled 2021-11-12: qty 50

## 2021-11-12 MED ORDER — MIDAZOLAM-SODIUM CHLORIDE 100-0.9 MG/100ML-% IV SOLN
0.5000 mg/h | INTRAVENOUS | Status: DC
  Administered 2021-11-12: 0.5 mg/h via INTRAVENOUS
  Administered 2021-11-13: 6.5 mg/h via INTRAVENOUS
  Administered 2021-11-13 (×2): 12 mg/h via INTRAVENOUS
  Filled 2021-11-12 (×4): qty 100

## 2021-11-12 MED ORDER — SODIUM CHLORIDE 0.9 % IV SOLN
750.0000 mg | Freq: Two times a day (BID) | INTRAVENOUS | Status: DC
  Administered 2021-11-13 – 2021-11-17 (×9): 750 mg via INTRAVENOUS
  Filled 2021-11-12 (×12): qty 7.5

## 2021-11-12 MED ORDER — PROPOFOL 1000 MG/100ML IV EMUL
INTRAVENOUS | Status: AC
  Filled 2021-11-12: qty 100

## 2021-11-12 MED ORDER — LORAZEPAM 2 MG/ML IJ SOLN
INTRAMUSCULAR | Status: AC
  Administered 2021-11-12: 2 mg via INTRAVENOUS
  Filled 2021-11-12: qty 1

## 2021-11-12 MED ORDER — ORAL CARE MOUTH RINSE
15.0000 mL | OROMUCOSAL | Status: DC
  Administered 2021-11-12 – 2021-11-16 (×36): 15 mL via OROMUCOSAL

## 2021-11-12 MED ORDER — ACETAMINOPHEN 325 MG PO TABS
650.0000 mg | ORAL_TABLET | ORAL | Status: DC | PRN
  Administered 2021-11-22: 650 mg via ORAL
  Filled 2021-11-12 (×2): qty 2

## 2021-11-12 MED ORDER — SODIUM CHLORIDE 0.9 % IV SOLN
INTRAVENOUS | Status: AC | PRN
  Administered 2021-11-12: 1000 mL via INTRAVENOUS

## 2021-11-12 MED ORDER — LEVETIRACETAM IN NACL 1500 MG/100ML IV SOLN
1500.0000 mg | Freq: Once | INTRAVENOUS | Status: AC
  Administered 2021-11-12: 1500 mg via INTRAVENOUS
  Filled 2021-11-12: qty 100

## 2021-11-12 NOTE — Consult Note (Signed)
? ?NAME:  Kendra Watts, MRN:  [id number], DOB:  09/03/1945, LOS: 0 ?ADMISSION DATE:  11/12/2021 CONSULTATION DATE:  11/12/2021 ?REFERRING MD:  [place] - Neuro CHIEF COMPLAINT:  AMS, seizure activity  ? ?History of Present Illness:  ?76 year old woman who presented to [facility] ED 4/24 via EMS for Code Stroke due to AMS, seizure activity. [place] ~1500 when patient was found down. PMHx significant for HTN, HLD, CHF (Echo 04/2020 LVEF 60-65%,), COPD (on 3L home O2), RAS with nonfunctioning L kidney, breast CA (s/p radiation 2017-2020), lung adenocarcinoma. ? ?Per chart review, patient was found down at home around 1500 by a friend; she was down minutes at most prior to being discovered.  Tonic-clonic seizure-like activity was noted en route with EMS and patient was unresponsive on arrival to ED with ongoing tonic BUE seizure activity.  While in ED, patient was intubated for airway protection and was noted to have posturing/biting ETT. Code Stroke initiated; Neurology was consulted and EEG was initiated with Keppra load, propofol/Versed drips.  Patient was noted to be hypertensive and clevidipine drip was started. CT Head showed small acute parenchymal hemorrhage in the left septal lobe, unlikely causing patient's symptoms.  CTA Head/Neck was grossly unremarkable , C-spine negative for fracture. ? ?PCCM was consulted for ventilator management. ? ?Pertinent Medical History:  ? ?Past Medical History:  ?Diagnosis Date  ? Anemia   ? 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 Breast cancer ([place]) 09/22/2015  ? Malignant  ? Breast cancer of upper-outer quadrant of left female breast ([place]) 09/08/2015  ? Cataract, immature   ? CHF (congestive heart failure) ([place])   ? Acute CHF-06/2018  ? COPD (chronic obstructive pulmonary disease) ([place])   ? Femur fracture, left ([place]) 04/11/2020  ? left femur fracture( greater trochanter  ? Heart murmur   ? states no known problems  ? History of hyperthyroidism   ? History of radiation therapy   ?  bilateral lungs - 10/27/18-11/02/18, Dr. [name]  ? History of radiation therapy 11/28/2015  ? left breast 10/30/2015-11/28/2015   Dr [name]  ? Hyperlipidemia   ? Hypertension   ? states under control with meds., has been on med. x "long time"  ? Hypokalemia   ? from last physical.   ? Hypothyroidism   ? Nonfunctioning kidney   ? left  ? Personal history of radiation therapy   ? 2017  ? Pulmonary nodules   ? Bilateral  ? Radiation 10/30/15-11/28/15  ? left breast 42.72 Gy, boosted to 10 Gy  ? Renal artery stenosis ([place])   ? Tobacco abuse   ? Wears partial dentures   ? upper and lower  ? ?Significant Hospital Events: ?Including procedures, antibiotic start and stop dates in addition to other pertinent events   ?4/24 - Admitted via [facility] ED for AMS, seizure-like activity. CT Head with small L occipital parenchymal hemorrhage, CTA Head/Neck without LVO. Neuro c/s, EEG/AEDs started. ? ?Interim History / Subjective:  ?PCCM consulted for ventilator management ? ?Objective:  ?Blood pressure 129/61, pulse (!) 107, resp. rate 16, height 5' (1.524 m), weight 37.6 kg, SpO2 (!) 89 %. ?   ?   ? ?Intake/Output Summary (Last 24 hours) at 11/12/2021 1733 ?Last data filed at 11/12/2021 1657 ?Gross per 24 hour  ?Intake 1100 ml  ?Output --  ?Net 1100 ml  ? ?Filed Weights  ? 11/12/21 1613  ?Weight: 37.6 kg  ? ?Physical Examination: ?General: Chronically ill-appearing elderly woman in NAD. Cachectic,  frail. ?HEENT: [place]/AT, anicteric sclera, pupils equal round and 50mm, sluggishly reactive, moist mucous membranes. Scant blood around ETT. ?Neuro: Sedated. Withdraws to pain in all 4 extremities. C/f extensor posturing with BUE.Not following commands. +Corneal, +Cough, and +Gag  ?CV: RRR, no m/g/r. ?PULM: Breathing even and unlabored on vent (PEEP 5, FiO2 40%). Clear secretions noted. Lung fields CTAB. ?GI: Soft, nontender, nondistended. Normoactive bowel sounds. ?Extremities: No LE edema noted. ?Skin: Warm/dry, no rashes. ? ?Resolved  Hospital Problem List:  ? ? ?Assessment & Plan:  ?Status epilepticus ?Altered mental status in the setting of seizures ?Admitted via [facility] ED 4/24 for AMS, unresponsiveness and seizure-like activity. CT Head showed small acute parenchymal hemorrhage in the left occipital lobe (unlikely causing patient's symptoms). CTA Head/Neck was grossly unremarkable , C-spine negative for fracture. ?- Neurology managing as primary service ?- AEDs per Neuro; continuing Keppra, Propofol, Versed gtt ?- LTM EEG in place ?- Additional brain imaging per Neuro ?- Seizure precautions ?- Neuroprotective measures: HOB > 30 degrees, normoglycemia, normothermia, electrolytes WNL ? ?Acute hypoxemic respiratory failure ?COPD, on 3L home O2 therapy ?- Continue full vent support (4-8cc/kg IBW) ?- Wean FiO2 for O2 sat > 90% ?- Daily WUA/SBT as mental status tolerates, currently precluded by ongoing seizure activity ?- VAP bundle ?- Resume home bronchodilators or similar ?- Pulmonary hygiene ?- PAD protocol for sedation: Propofol, Fentanyl, and Versed for goal RASS -5 ? ?Hypertension ?Home regimen includes: Lopressor, Cardura, Lasix. ?- Clevidipine gtt titrated to goal SBP ?- Goal SBP < 110 ?- Cardiac monitoring ? ?Remainder per Primary Team ? ?Best Practice: (right click and "Reselect all SmartList Selections" daily)  ? ?Diet/type: NPO ?DVT prophylaxis: SCDs ?GI prophylaxis: PPI ?Lines: N/A ?Foley:  Yes, and it is still needed ?Code Status:  full code ?Last date of multidisciplinary goals of care discussion [Pending] ? ?Labs:  ?CBC: ?Recent Labs  ?Lab 11/12/21 ?1610  ?WBC 10.0  ?NEUTROABS 6.7  ?HGB 11.5*  ?HCT 37.1  ?MCV 97.4  ?PLT 201  ? ?Basic Metabolic Panel: ?Recent Labs  ?Lab 11/12/21 ?1610  ?NA 138  ?K 3.6  ?CL 96*  ?CO2 29  ?GLUCOSE 241*  ?BUN 9  ?CREATININE 0.88  ?CALCIUM 8.9  ? ?GFR: ?Estimated Creatinine Clearance: 32.8 mL/min (by C-G formula based on SCr of 0.88 mg/dL). ?Recent Labs  ?Lab 11/12/21 ?1610  ?WBC 10.0  ?LATICACIDVEN 8.2*   ? ?Liver Function Tests: ?Recent Labs  ?Lab 11/12/21 ?1610  ?AST 30  ?ALT 19  ?ALKPHOS 67  ?BILITOT 0.4  ?PROT 6.7  ?ALBUMIN 4.0  ? ?No results for input(s): LIPASE, AMYLASE in the last 168 hours. ?No results for input(s): AMMONIA in the last 168 hours. ? ?ABG: ?   ?Component Value Date/Time  ? PHART 7.331 (L) 05/20/2020 1141  ? PCO2ART 76.1 (HH) 05/20/2020 1141  ? PO2ART 145 (H) 05/20/2020 1141  ? HCO3 40.3 (H) 05/20/2020 1141  ? TCO2 43 (H) 05/20/2020 1141  ? O2SAT 99.0 05/20/2020 1141  ?  ?Coagulation Profile: ?Recent Labs  ?Lab 11/12/21 ?1610  ?INR 1.0  ? ?Cardiac Enzymes: ?No results for input(s): CKTOTAL, CKMB, CKMBINDEX, TROPONINI in the last 168 hours. ? ?HbA1C: ?No results found for: HGBA1C ? ?CBG: ?Recent Labs  ?Lab 11/12/21 ?1608  ?GLUCAP 197*  ? ?Review of Systems:   ?Patient is encephalopathic and/or intubated. Therefore history has been obtained from chart review.  ? ?Past Medical History:  ?She,  has a past medical history of Anemia, Aortic arch anomaly, Breast cancer ([place]) (09/22/2015), Breast  cancer of upper-outer quadrant of left female breast ([place]) (09/08/2015), Cataract, immature, CHF (congestive heart failure) ([place]), COPD (chronic obstructive pulmonary disease) ([place]), Femur fracture, left ([place]) (04/11/2020), Heart murmur, History of hyperthyroidism, History of radiation therapy, History of radiation therapy (11/28/2015), Hyperlipidemia, Hypertension, Hypokalemia, Hypothyroidism, Nonfunctioning kidney, Personal history of radiation therapy, Pulmonary nodules, Radiation (10/30/15-11/28/15), Renal artery stenosis ([place]), Tobacco abuse, and Wears partial dentures.  ? ?Surgical History:  ? ?Past Surgical History:  ?Procedure Laterality Date  ? ABDOMINAL ANGIOGRAM  02/18/2012  ? Procedure: ABDOMINAL ANGIOGRAM;  Surgeon: Lorretta Harp, MD;  Location: [facility] CATH LAB;  Service: Cardiovascular;;  ? ABDOMINAL AORTAGRAM  07/04/2014  ? ABDOMINAL HYSTERECTOMY  ~ 1977  ? partial  ? APPENDECTOMY    ? ARCH AORTOGRAM     ? BREAST LUMPECTOMY Left 09/22/2015  ? Malignant  ? CAROTID ANGIOGRAM N/A 02/18/2012  ? Procedure: CAROTID ANGIOGRAM;  Surgeon: Lorretta Harp, MD;  Location: [facility] CATH LAB;  Service: Cardiovascular;  Laterality:

## 2021-11-12 NOTE — Progress Notes (Addendum)
Pt transported  on vent from 031 to CT and back with out any complications. RN at bedside, RT will continue to monitor.  ?

## 2021-11-12 NOTE — Progress Notes (Signed)
Patient transported to CT and back to 9P11 without complications. RN and CNA assisted. ?

## 2021-11-12 NOTE — H&P (Addendum)
NEUROLOGY CONSULTATION NOTE  ? ?Date of service: November 12, 2021 ?Patient Name: Kendra Watts ?MRN:  [id number] ?DOB:  10/11/1945 ?Reason for consult: "Code stroke" ?Requesting Provider: Lorenza Chick, MD ? ?History of Present Illness  ?Kendra Watts is a 76 y.o. female with PMH HTN, HLD, CHF, hypothyroidism, non-functional left kidney, breast cancer, lung adenocarcinoma, tobacco abuse, COPD on 3 L home O2 who presented to [facility] (11/12/2021) for Code stroke after episode of staring and witnessed fall. She had reportedly left-sided shaking with EMS and was intubated on arrival to ED due to extremely poor mental status and inability to protect her airway.  Per ED provider she did have gag during intubation and equal pupils. She received versed, etomidate, succinylcholine and propofol. Keppra 1500mg  and ativan 2mg  for seizure.  ? ?After intubation and en route to CT, patient started posturing and bitting ETT.  ?CT showed acute hemorrhage in left occipital lobe ? ? ?EMS: SBPs in the 190s, CBG: 208, Non rebreather 15L ? ?LKN: 2:10pm on 11/12/2021 ?tNK: Contraindicated - hemorrhage ?Thrombectomy: Contraindicated - hemorrhage ? ?Additional history obtained from Darrol Angel [phone]).  She reports that she and her sister care for the patient.  Today she came at 11:15 AM to take the patient's dog to the [place].  When she returned at around 2 PM the patient was fine.  The patient went outside to smoke a cigarette and came back in 215 and initially still seemed fine.  However when the patient was asked a question she would not answer and was staring straight ahead.  She had to be asked repeatedly to sit down and did finally sit down.  Then she spontaneously got up and ate a banana.  However she could not speak and her mouth seemed twisted.  Ms. Midge Aver then called her sister Nevin Bloodgood [phone]) for advice and when she turned around to the patient had fallen on the floor and was foaming at the mouth. ? ? ? ?ROS  ? ?Unable  to obtain-intubated ? ?Past History  ? ?Past Medical History:  ?Diagnosis Date  ? Anemia   ? 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 Breast cancer ([place]) 09/22/2015  ? Malignant  ? Breast cancer of upper-outer quadrant of left female breast ([place]) 09/08/2015  ? Cataract, immature   ? CHF (congestive heart failure) ([place])   ? Acute CHF-06/2018  ? COPD (chronic obstructive pulmonary disease) ([place])   ? Femur fracture, left ([place]) 04/11/2020  ? left femur fracture( greater trochanter  ? Heart murmur   ? states no known problems  ? History of hyperthyroidism   ? History of radiation therapy   ? bilateral lungs - 10/27/18-11/02/18, Dr. [name]  ? History of radiation therapy 11/28/2015  ? left breast 10/30/2015-11/28/2015   Dr [name]  ? Hyperlipidemia   ? Hypertension   ? states under control with meds., has been on med. x "long time"  ? Hypokalemia   ? from last physical.   ? Hypothyroidism   ? Nonfunctioning kidney   ? left  ? Personal history of radiation therapy   ? 2017  ? Pulmonary nodules   ? Bilateral  ? Radiation 10/30/15-11/28/15  ? left breast 42.72 Gy, boosted to 10 Gy  ? Renal artery stenosis ([place])   ? Tobacco abuse   ? Wears partial dentures   ? upper and lower  ? ?Past Surgical History:  ?Procedure Laterality Date  ? ABDOMINAL ANGIOGRAM  02/18/2012  ?  Procedure: ABDOMINAL ANGIOGRAM;  Surgeon: Lorretta Harp, MD;  Location: [facility] CATH LAB;  Service: Cardiovascular;;  ? ABDOMINAL AORTAGRAM  07/04/2014  ? ABDOMINAL HYSTERECTOMY  ~ 1977  ? partial  ? APPENDECTOMY    ? ARCH AORTOGRAM    ? BREAST LUMPECTOMY Left 09/22/2015  ? Malignant  ? CAROTID ANGIOGRAM N/A 02/18/2012  ? Procedure: CAROTID ANGIOGRAM;  Surgeon: Lorretta Harp, MD;  Location: [facility] CATH LAB;  Service: Cardiovascular;  Laterality: N/A;  ? ENDARTERECTOMY  04/02/2012  ? Procedure: ENDARTERECTOMY CAROTID;  Surgeon: Serafina Mitchell, MD;  Location: [facility];  Service: Vascular;  Laterality: Right;  ? FUDUCIAL PLACEMENT Bilateral 09/02/2018  ? Procedure:  Placement Of Fiducial to right upper lobe & left upper lobe lung;  Surgeon: [name], MD;  Location: [facility] OR;  Service: Thoracic;  Laterality: Bilateral;  ? IR THORACENTESIS ASP PLEURAL SPACE W/IMG GUIDE  07/08/2018  ? RADIOACTIVE SEED GUIDED PARTIAL MASTECTOMY WITH AXILLARY SENTINEL LYMPH NODE BIOPSY Left 09/22/2015  ? Procedure: INJECT BLUE DYE LEFT BREAST,RADIOACTIVE SEED GUIDED PARTIAL MASTECTOMY WITH AXILLARY SENTINEL LYMPH NODE BIOPSY;  Surgeon: Fanny Skates, MD;  Location: [facility];  Service: General;  Laterality: Left;  ? RENAL ANGIOGRAM Left 06/08/2010  ? renal artery stent -  5x12 Genesis on Aviator balloon stent (Dr. Adora Fridge)  ? RENAL ANGIOGRAM Right 07/04/2014  ? Procedure: RENAL ANGIOGRAM;  Surgeon: Lorretta Harp, MD;  Location[facility] CATH LAB;  Service: Cardiovascular;  Laterality: Right;  ? RENAL ANGIOGRAM Right 08/22/2014  ? Procedure: RENAL ANGIOGRAM;  Surgeon: Lorretta Harp, MD;  Location: [facility] CATH LAB;  Service: Cardiovascular;  Laterality: Right;  ? TONSILLECTOMY    ? as a child  ? VIDEO BRONCHOSCOPY WITH ENDOBRONCHIAL NAVIGATION N/A 09/02/2018  ? Procedure: VIDEO BRONCHOSCOPY WITH ENDOBRONCHIAL NAVIGATION;  Surgeon: [name], MD;  Location: [facility] OR;  Service: Thoracic;  Laterality: N/A;  ? ?Current Outpatient Medications  ?Medication Instructions  ? albuterol (VENTOLIN HFA) 108 (90 Base) MCG/ACT inhaler 2 puffs, Inhalation, Every 6 hours PRN  ? aspirin 81 mg, Oral, Daily  ? Budeson-Glycopyrrol-Formoterol (BREZTRI AEROSPHERE) 160-9-4.8 MCG/ACT AERO 2 puffs, Inhalation, 2 times daily  ? doxazosin (CARDURA) 8 MG tablet 1 tablet, Oral, Daily  ? furosemide (LASIX) 20 mg, Oral, Daily  ? levothyroxine (SYNTHROID) 88 mcg, Oral, Daily before breakfast  ? metoprolol tartrate (LOPRESSOR) 25 mg, Oral, Daily  ? OXYGEN 2-4 L, Inhalation, As needed, As needed to maintain SATS > 90%   ? pantoprazole (PROTONIX) 40 mg, Oral, Daily  ? rosuvastatin (CRESTOR) 10 mg, Oral, Daily,     ? ? ? ?Family History  ?Problem Relation Age of Onset  ? Heart disease Mother   ?     MI @ 65, died at 9  ? Cancer Father   ? Lung cancer Father   ? Heart disease Maternal Grandmother   ? Lung cancer Sister   ? Breast cancer Paternal Aunt   ? ?Social History  ? ?Socioeconomic History  ? Marital status: Divorced  ?  Spouse name: Not on file  ? Number of children: 0  ? Years of education: College  ? Highest education level: Bachelor's degree (e.g., BA, AB, BS)  ?Occupational History  ? Occupation: HR Scientist, physiological  ?  Employer: [employer]  ?Tobacco Use  ? Smoking status: Every Day  ?  Packs/day: 0.50  ?  Years: 40.00  ?  Pack years: 20.00  ?  Types: Cigarettes  ? Smokeless tobacco: Never  ?  Tobacco comments:  ?  15 cigarettes smoked daily ARJ 04/19/21  ?Vaping Use  ? Vaping Use: Never used  ?Substance and Sexual Activity  ? Alcohol use: Yes  ?  Comment: occasionally  ? Drug use: No  ? Sexual activity: Never  ?Other Topics Concern  ? Not on file  ?Social History Narrative  ? Not on file  ? ?Social Determinants of Health  ? ?Financial Resource Strain: Not on file  ?Food Insecurity: Not on file  ?Transportation Needs: Not on file  ?Physical Activity: Not on file  ?Stress: Not on file  ?Social Connections: Not on file  ? ?No Known Allergies ? ?Medications  ?(Not in a hospital admission) ?  ? ?Vitals  ? ?Vitals:  ? 11/12/21 1706 11/12/21 1712 11/12/21 1715 11/12/21 1730  ?BP: (!) 137/94 (!) 159/66 (!) 148/71 129/61  ?Pulse: (!) 107 (!) 108 (!) 102 (!) 107  ?Resp: 17 12 18 16   ?SpO2: 90% 91% 96% (!) 89%  ?Weight:      ?Height:      ?  ? ?Body mass index is 16.21 kg/m?. ? ?Physical Exam  ?General: Laying in bed ?Head: Occipital laceration ?Pulmonary: Intubated ?Ext: No cyanosis, edema, or deformity. Forearm bruising ?Musculoskeletal: Normal digits and nails by inspection. No clubbing.  ?Abdomen: Soft, nontender ? ?Neurologic Examination  ?Mental status/Cognition:  ?Sedated on versed, etomidate, succinylcholine and  propofol. Received fentanyl. Intubated ? ?Cranial Nerves: ?II: No blinks to thread OU medially and laterally.  ?III,IV, VI: Pinpoint pupils.  On initial examination had right pupil 1 mm larger than the left but bo

## 2021-11-12 NOTE — ED Notes (Signed)
Patient currently posturing and biting ETT tubeDr Bhagat at bedside transported to CT ?

## 2021-11-12 NOTE — ED Notes (Addendum)
Patient began posturing and biting ETT  ?

## 2021-11-12 NOTE — ED Notes (Signed)
Emergency contact/Nephew Sande Brothers [phone] wants an update asap ?

## 2021-11-12 NOTE — Progress Notes (Signed)
Patient transported to 4L93 w/o complications. Uneventful trip. Report given to unit RRT. ? ?Kendra Watts L. Tamala [name], BS, RRT-ACCS, RCP ?

## 2021-11-12 NOTE — Procedures (Signed)
Patient Name: Kendra Watts  ?MRN: [id number]  ?Epilepsy Attending: Lora Havens  ?Referring Physician/Provider: Lorenza Chick, MD ?Date: 11/12/2021 ?Duration: 21.26 mins ? ?Patient history:  76 y.o. female with PMH HTN, HLD, CHF, hypothyroidism, non-functional left kidney, breast cancer, lung adenocarcinoma, ongoing tobacco abuse, COPD on 3 L home O2 who presented to [facility] (11/12/2021) for Code stroke after episode of staring and witnessed fall. EEG to evaluate for seizure ? ?Level of alertness:  comatose ? ?AEDs during EEG study: LEV, versed, propofol ? ?Technical aspects: This EEG study was done with scalp electrodes positioned according to the 10-20 International system of electrode placement. Electrical activity was acquired at a sampling rate of 500Hz  and reviewed with a high frequency filter of 70Hz  and a low frequency filter of 1Hz . EEG data were recorded continuously and digitally stored.  ? ?Description: EEG showed continuous generalized and  lateralized right hemisphere 3 to 6 Hz theta-delta slowing. Hyperventilation and photic stimulation were not performed.    ? ?ABNORMALITY ?- Continuous slow, generalized  and  lateralized right hemisphere  ? ?IMPRESSION: ?This study is suggestive of cortical dysfunction arising from right hemisphere, likely secondary to underlying bleed. Additionally, there is severe diffuse encephalopathy, nonspecific etiology. No seizures or epileptiform discharges were seen throughout the recording. ? ?Lora Havens  ? ?

## 2021-11-12 NOTE — ED Notes (Signed)
Small head lac noted to left occipital/parietal region MD notified ? ?

## 2021-11-12 NOTE — Progress Notes (Signed)
eLink Physician-Brief Progress Note ?Patient Name: [name] ?DOB: 1946-01-13 ?MRN: [id number] ? ? ?Date of Service ? 11/12/2021  ?HPI/Events of Note ? Patient admitted with altered mental status, seizures, intracranial hemorrhage, acute hypoxemic / hypercapnic respiratory failure s/p intubation and mechanical ventilation.  ?eICU Interventions ? New Patient Evaluation.  ? ? ? ?  ? ?[name] Miyeko Mahlum ?11/12/2021, 7:46 PM ?

## 2021-11-12 NOTE — ED Triage Notes (Signed)
BIB EMS after with friend was with her and she walked out of the room and found patient unresponsive.  Patient had seizure like activity with ems on left side.  Patient unresponsive upon arrival on NRB 15L.  18g LAC CBG 208 ?

## 2021-11-12 NOTE — ED Notes (Signed)
[name] and Benjamine Mola transporting patient to ICU with Linton Rump RT.  [name] to give report.  ?

## 2021-11-12 NOTE — ED Provider Notes (Addendum)
?[facility] ?Provider Note ? ? ?CSN: [id number] ?Arrival date & time: 11/12/21  1600 ? ?  ? ?History ? ?Chief Complaint  ?Patient presents with  ? Seizures  ? ? ?Kendra Watts is a 76 y.o. female. ? ?17-year-old female with prior medical history as detailed below presents for evaluation.  She arrives by EMS from home.  Patient with reported sudden change in level of consciousness around 3 PM.  EMS was called.  Upon EMS arrival the patient was lethargic and poorly responsive.  Patient then had witnessed left-sided tonic-clonic seizure.  This lasted approximately 5 minutes.  EMS administered a total of 5 mg of Versed prior to arrival. ? ?On arrival to the ED the patient is unresponsive.  She is breathing shallowly.  She is unresponsive to painful stimuli in all 4 extremities.  ? ?She is unable to provide significant history. ? ?Chart review suggest that the patient is full code.  Patient without prior documented history of seizure disorder. ? ? ?The history is provided by the patient, medical records and the EMS personnel.  ?Illness ?Location:  Sudden change in mentation, seizure activity ?Severity:  Severe ?Onset quality:  Sudden ?Duration:  1 hour ?Timing:  Unable to specify ?Progression:  Unable to specify ? ?  ? ?Home Medications ?Prior to Admission medications   ?Medication Sig Start Date End Date Taking? Authorizing Provider  ?albuterol (VENTOLIN HFA) 108 (90 Base) MCG/ACT inhaler Inhale 2 puffs into the lungs every 6 (six) hours as needed for wheezing or shortness of breath. 11/17/20   [name], MD  ?aspirin 81 MG tablet Take 1 tablet (81 mg total) by mouth daily. 03/21/20   Hongalgi, Lenis Dickinson, MD  ?Budeson-Glycopyrrol-Formoterol (BREZTRI AEROSPHERE) 160-9-4.8 MCG/ACT AERO Inhale 2 puffs into the lungs in the morning and at bedtime. 06/12/21   [name], MD  ?doxazosin (CARDURA) 8 MG tablet Take 1 tablet by mouth daily.    [provider]  ?furosemide  (LASIX) 20 MG tablet Take 1 tablet (20 mg total) by mouth daily. 07/13/18 08/28/21  Kayleen Memos, DO  ?levothyroxine (SYNTHROID) 88 MCG tablet Take 88 mcg by mouth daily before breakfast.    [provider]  ?metoprolol tartrate (LOPRESSOR) 25 MG tablet Take 25 mg by mouth daily. 09/21/20   [provider]  ?OXYGEN Inhale 2-4 L into the lungs as needed. As needed to maintain SATS > 90%    [provider]  ?pantoprazole (PROTONIX) 40 MG tablet Take 1 tablet (40 mg total) by mouth daily. 07/14/18   Kayleen Memos, DO  ?rosuvastatin (CRESTOR) 10 MG tablet Take 10 mg by mouth daily.    [provider]  ?   ? ?Allergies    ?Patient has no known allergies.   ? ?Review of Systems   ?Review of Systems  ?Unable to perform ROS: Acuity of condition  ? ?Physical Exam ?Updated Vital Signs ?BP (!) 167/61   Pulse 100   Resp 11   Ht 5' (1.524 m)   Wt 37.6 kg   SpO2 97%   BMI 16.21 kg/m?  ?Physical Exam ?Vitals and nursing note reviewed.  ?Constitutional:   ?   General: She is not in acute distress. ?   Appearance: She is well-developed.  ?   Comments: Unresponsive ? ?Appears postictal and sedated ? ?Shallow and slow respirations noted ? ?Flaccidly weak in all 4 extremities ? ?Pupils are 2 mm bilaterally and sluggish ? ?No withdrawal  or movement with painful stimuli in all 4 extremity  ?HENT:  ?   Head: Normocephalic.  ?Eyes:  ?   Conjunctiva/sclera: Conjunctivae normal.  ?Cardiovascular:  ?   Rate and Rhythm: Normal rate and regular rhythm.  ?   Heart sounds: Normal heart sounds.  ?Pulmonary:  ?   Effort: No respiratory distress.  ?Abdominal:  ?   General: There is no distension.  ?   Palpations: Abdomen is soft.  ?   Tenderness: There is no abdominal tenderness.  ?Musculoskeletal:     ?   General: No deformity. Normal range of motion.  ?   Cervical back: Normal range of motion and neck supple.  ?Skin: ?   General: Skin is warm and dry.  ? ? ?ED Results / Procedures / Treatments   ?Labs ?(all  labs ordered are listed, but only abnormal results are displayed) ?Labs Reviewed  ?CBG MONITORING, ED - Abnormal; Notable for the following components:  ?    Result Value  ? Glucose-Capillary 197 (*)   ? All other components within normal limits  ?RESP PANEL BY RT-PCR (FLU A&B, COVID) [id number]  ?PROTIME-INR  ?CBC WITH DIFFERENTIAL/PLATELET  ?COMPREHENSIVE METABOLIC PANEL  ?LACTIC ACID, PLASMA  ?LACTIC ACID, PLASMA  ?URINALYSIS, ROUTINE W REFLEX MICROSCOPIC  ?APTT  ?LIPID PANEL  ?HEMOGLOBIN A1C  ?URINALYSIS, ROUTINE W REFLEX MICROSCOPIC  ?MAGNESIUM  ?I-STAT VENOUS BLOOD GAS, ED  ?I-STAT CHEM 8, ED  ?I-STAT ARTERIAL BLOOD GAS, ED  ?TYPE AND SCREEN  ?TROPONIN I (HIGH SENSITIVITY)  ? ? ?EKG ?None ? ?Radiology ?DG Chest Port 1 View ? ?Result Date: 11/12/2021 ?CLINICAL DATA:  Post intubation and EG tube placement in a 76 year old female. EXAM: PORTABLE CHEST 1 VIEW COMPARISON:  May 20, 2020. FINDINGS: EKG leads project over the chest. Endotracheal tube terminates 3.6 cm from the carina. Gastric tube coiled in the stomach. Image rotated the LEFT. Accounting for this cardiomediastinal contours and hilar structures are stable. Post treatment changes with bandlike morphology in the LEFT upper lobe with surgical clips and fiducial markers about the chest as on the prior chest CT from January of 2023. No lobar consolidation. No pneumothorax or evidence of pleural effusion. On limited assessment there is no acute skeletal process. IMPRESSION: 1. Endotracheal tube 3.6 cm from the carina. Gastric tube coiled in the stomach. 2. Post treatment changes in the LEFT upper chest as before. No acute findings by radiograph. Electronically Signed   By: Zetta Bills M.D.   On: 11/12/2021 16:36  ? ?CT HEAD CODE STROKE WO CONTRAST` ? ?Result Date: 11/12/2021 ?CLINICAL DATA:  Code stroke. EXAM: CT HEAD WITHOUT CONTRAST TECHNIQUE: Contiguous axial images were obtained from the base of the skull through the vertex without intravenous contrast.  RADIATION DOSE REDUCTION: This exam was performed according to the departmental dose-optimization program which includes automated exposure control, adjustment of the mA and/or kV according to patient size and/or use of iterative reconstruction technique. COMPARISON:  05/20/2020 FINDINGS: Brain: Small area of acute parenchymal hemorrhage is present in the left occipital lobe. Suspect trace subdural hemorrhage along the adjacent left tentorial leaflet. No mass effect. Prominence of the ventricles and sulci reflects stable parenchymal volume loss. Gray-white differentiation is preserved. Additional areas of patchy low-density in the supratentorial white matter are nonspecific but may reflect moderate chronic microvascular ischemic changes. Vascular: No hyperdense vessel. There is intracranial atherosclerotic calcification at the skull base. Skull: Unremarkable. Sinuses/Orbits: Mild lobular right maxillary sinus mucosal thickening. Left lens replacement. Other: Mastoid air cells  are clear. IMPRESSION: Small acute parenchymal hemorrhage in the left occipital lobe. Suspect trace adjacent subdural hemorrhage along the left tentorial leaflet. These results were communicated to Dr. Curly Shores at 4:40 pm on 11/12/2021 by text page via the [facility] messaging system. Electronically Signed   By: Macy Mis M.D.   On: 11/12/2021 16:45   ? ?Procedures ?Procedure Name: Intubation ?Date/Time: 11/12/2021 5:08 PM ?Performed by: Valarie Merino, MD ?Pre-anesthesia Checklist: Patient identified, Patient being monitored, Emergency Drugs available, Timeout performed and Suction available ?Oxygen Delivery Method: Non-rebreather mask ?Preoxygenation: Pre-oxygenation with 100% oxygen ?Induction Type: Rapid sequence ?Ventilation: Mask ventilation without difficulty ?Laryngoscope Size: Glidescope and 4 ?Grade View: Grade I ?Tube size: 7.0 mm ?Number of attempts: 1 ?Placement Confirmation: ETT inserted through vocal cords under direct vision, CO2  detector and Breath sounds checked- equal and bilateral ?Secured at: 22 cm ?Tube secured with: ETT holder ? ? ? ?Marland Kitchen.Laceration Repair ? ?Date/Time: 11/12/2021 6:53 PM ?Performed by: Valarie Merino, MD ?Josem Kaufmann

## 2021-11-12 NOTE — Progress Notes (Signed)
EEG complete - results pending 

## 2021-11-12 NOTE — Progress Notes (Signed)
LTM maint complete - no skin breakdown  ?Patient relocated from ED to floor.  Serviced P3 Cz and ref ?[facility] monitored, Event button test confirmed by [facility]. ? ?

## 2021-11-12 NOTE — Progress Notes (Signed)
LTM EEG hooked up and running - no initial skin breakdown. However, pt has a lact in C3/P3 region. ED MD was made aware- push button tested - neuro notified. No [facility] monitoring. Pt is in the ED. ? ?

## 2021-11-12 NOTE — Plan of Care (Incomplete)
Additional history obtained from Darrol Angel [phone]).  She reports that she and her sister care for the patient.  Today she came at 11:15 AM to take the patient's dog to the [place].  When she returned at around 2 PM the patient was fine.  The patient went outside to smoke a cigarette and came back in 215 and initially still seemed fine.  However when the patient was asked a question she would not answer and was staring straight ahead.  She had to be asked repeatedly to sit down and did finally sit down.  Then she spontaneously got up and ate a banana.  However she could not speak and her mouth seemed twisted.  Ms. Midge Aver then called her sister Nevin Bloodgood [phone]) for advice and when she turned around to the patient had fallen on the floor and was foaming at the mouth. ? ?Ms. Locke also notes that the patient had an episode of confusion over the weekend.  She was confused about whether or not her dog grooming appointment was canceled for this Monday and that seemed atypical for her.  Otherwise she also was complaining of some blood in her stool and was anxious about needing a colonoscopy for which she anesthesia would be complicated due to her congestive heart failure and COPD, as well as being anxious about some health problems in her family (Mr. Sherre Poot being diagnosed recently with prostate cancer) ? ?Updated nephew Jolene Schimke, who reported that he is her executor and she has been updating her will.  He believes he is also her healthcare power of attorney but will confirm that.  He reported it is okay for this Darrol Angel and her Derrel Nip to visit the patient and be given information about her health, although he would be making decisions.  He will be the main point of contact. ? ?Extended Emergency Contact Information ?Primary Emergency Contact: Mansfield,Ken ?Home Phone: [phone] ?Relation: Nephew ?Secondary Emergency Contact: Luck,Tammy ?Mobile Phone: [phone] ?Relation: Friend ? ?

## 2021-11-12 NOTE — Code Documentation (Signed)
Stroke Response Nurse Documentation ?Code Documentation ? ?JEILANI GRUPE is a 76 y.o. female arriving to [facility]  via [facility] on 11/12/21 with past medical hx of CHF, COPD, BrCA left breast, HLD, HTN, nonfunctioning left kidney, lung adenocarcinoma. On aspirin 81 mg daily. Code stroke was activated by ED.  ? ?Patient from home where she was with her friend in her normal state LKW 1515. Her friend walked out of the room for a few moments and returned to find her unresponsive.  EMS arrived and noticed patient with left sided seizure activity and placed patient on 15L NRB.   ? ?Patient not initially paged as Code Stroke. Brought to ED 31, intubated by Dr. Francia Greaves for airway protection 1610. Seizure activity noticed bilateral upper extremities. Code Stroke paged. This SRN in ED at time of event helping with intubation, called MD Bhagat of notification.  ? ?Patient to CT with team. NIHSS 34, see documentation for details and code stroke times. Patient with decreased LOC, disoriented, not following commands, bilateral gaze preference , bilateral hemianopia, bilateral facial droop, bilateral arm weakness, bilateral leg weakness, bilateral decreased sensation, Global aphasia , dysarthria , and Sensory  neglect on exam.  ? ?The following imaging was completed:  CT Head and CTA. Patient is not a candidate for IV Thrombolytic due to hemorrhage. Patient is not a candidate for IR due to no LVO.  ? ?Care Plan: q1h neuro checks, SBP 130-150.  ? ?Bedside handoff with ED RN Jinny Blossom.   ? ?Newman Nickels  ?Stroke Response RN ? ? ?

## 2021-11-13 ENCOUNTER — Inpatient Hospital Stay (HOSPITAL_COMMUNITY): Payer: Medicare Other

## 2021-11-13 DIAGNOSIS — I611 Nontraumatic intracerebral hemorrhage in hemisphere, cortical: Secondary | ICD-10-CM | POA: Diagnosis not present

## 2021-11-13 DIAGNOSIS — I6389 Other cerebral infarction: Secondary | ICD-10-CM | POA: Diagnosis not present

## 2021-11-13 DIAGNOSIS — G9340 Encephalopathy, unspecified: Secondary | ICD-10-CM | POA: Diagnosis not present

## 2021-11-13 DIAGNOSIS — G40901 Epilepsy, unspecified, not intractable, with status epilepticus: Secondary | ICD-10-CM | POA: Diagnosis not present

## 2021-11-13 DIAGNOSIS — R569 Unspecified convulsions: Secondary | ICD-10-CM | POA: Diagnosis not present

## 2021-11-13 DIAGNOSIS — E43 Unspecified severe protein-calorie malnutrition: Secondary | ICD-10-CM | POA: Diagnosis present

## 2021-11-13 DIAGNOSIS — R4182 Altered mental status, unspecified: Secondary | ICD-10-CM | POA: Diagnosis not present

## 2021-11-13 DIAGNOSIS — I619 Nontraumatic intracerebral hemorrhage, unspecified: Secondary | ICD-10-CM | POA: Insufficient documentation

## 2021-11-13 DIAGNOSIS — Z9289 Personal history of other medical treatment: Secondary | ICD-10-CM

## 2021-11-13 HISTORY — DX: Personal history of other medical treatment: Z92.89

## 2021-11-13 LAB — POCT I-STAT 7, (LYTES, BLD GAS, ICA,H+H)
Acid-Base Excess: 7 mmol/L — ABNORMAL HIGH (ref 0.0–2.0)
Bicarbonate: 30.4 mmol/L — ABNORMAL HIGH (ref 20.0–28.0)
Calcium, Ion: 1.11 mmol/L — ABNORMAL LOW (ref 1.15–1.40)
HCT: 31 % — ABNORMAL LOW (ref 36.0–46.0)
Hemoglobin: 10.5 g/dL — ABNORMAL LOW (ref 12.0–15.0)
O2 Saturation: 99 %
Patient temperature: 98.5
Potassium: 2.6 mmol/L — CL (ref 3.5–5.1)
Sodium: 138 mmol/L (ref 135–145)
TCO2: 32 mmol/L (ref 22–32)
pCO2 arterial: 38.7 mmHg (ref 32–48)
pH, Arterial: 7.503 — ABNORMAL HIGH (ref 7.35–7.45)
pO2, Arterial: 119 mmHg — ABNORMAL HIGH (ref 83–108)

## 2021-11-13 LAB — GLUCOSE, CAPILLARY
Glucose-Capillary: 111 mg/dL — ABNORMAL HIGH (ref 70–99)
Glucose-Capillary: 125 mg/dL — ABNORMAL HIGH (ref 70–99)
Glucose-Capillary: 131 mg/dL — ABNORMAL HIGH (ref 70–99)
Glucose-Capillary: 82 mg/dL (ref 70–99)
Glucose-Capillary: 88 mg/dL (ref 70–99)
Glucose-Capillary: 92 mg/dL (ref 70–99)

## 2021-11-13 LAB — LACTIC ACID, PLASMA: Lactic Acid, Venous: 1.4 mmol/L (ref 0.5–1.9)

## 2021-11-13 LAB — BASIC METABOLIC PANEL
Anion gap: 11 (ref 5–15)
Anion gap: 6 (ref 5–15)
BUN: 7 mg/dL — ABNORMAL LOW (ref 8–23)
BUN: 7 mg/dL — ABNORMAL LOW (ref 8–23)
CO2: 27 mmol/L (ref 22–32)
CO2: 27 mmol/L (ref 22–32)
Calcium: 8.4 mg/dL — ABNORMAL LOW (ref 8.9–10.3)
Calcium: 9 mg/dL (ref 8.9–10.3)
Chloride: 99 mmol/L (ref 98–111)
Chloride: 105 mmol/L (ref 98–111)
Creatinine, Ser: 0.75 mg/dL (ref 0.44–1.00)
Creatinine, Ser: 0.68 mg/dL (ref 0.44–1.00)
GFR, Estimated: >60 mL/min (ref >60)
GFR, Estimated: >60 mL/min (ref >60)
Glucose, Bld: 82 mg/dL (ref 70–99)
Glucose, Bld: 110 mg/dL — ABNORMAL HIGH (ref 70–99)
Potassium: 2.9 mmol/L — ABNORMAL LOW (ref 3.5–5.1)
Potassium: 4.8 mmol/L (ref 3.5–5.1)
Sodium: 137 mmol/L (ref 135–145)
Sodium: 138 mmol/L (ref 135–145)

## 2021-11-13 LAB — ECHOCARDIOGRAM COMPLETE
AR max vel: 2.29 cm2
AV Peak grad: 6.4 mmHg
Ao pk vel: 1.27 m/s
Area-P 1/2: 3.77 cm2
Height: 60 in
S' Lateral: 2.5 cm
Weight: 1354.51 oz

## 2021-11-13 LAB — MAGNESIUM: Magnesium: 2.3 mg/dL (ref 1.7–2.4)

## 2021-11-13 LAB — PHOSPHORUS: Phosphorus: 4 mg/dL (ref 2.5–4.6)

## 2021-11-13 MED ORDER — UMECLIDINIUM BROMIDE 62.5 MCG/ACT IN AEPB
1.0000 | INHALATION_SPRAY | Freq: Every day | RESPIRATORY_TRACT | Status: DC
  Filled 2021-11-13: qty 7

## 2021-11-13 MED ORDER — VITAL HIGH PROTEIN PO LIQD: 1000.0000 mL | ORAL | Status: DC

## 2021-11-13 MED ORDER — POTASSIUM CHLORIDE 20 MEQ PO PACK
40.0000 meq | PACK | Freq: Once | ORAL | Status: AC
  Administered 2021-11-13: 40 meq
  Filled 2021-11-13: qty 2

## 2021-11-13 MED ORDER — NOREPINEPHRINE 4 MG/250ML-% IV SOLN
0.0000 ug/min | INTRAVENOUS | Status: AC
  Administered 2021-11-13: 2 ug/min via INTRAVENOUS
  Filled 2021-11-13: qty 250

## 2021-11-13 MED ORDER — LEVOTHYROXINE SODIUM 88 MCG PO TABS: 88.0000 ug | ORAL_TABLET | Freq: Every day | ORAL | Status: DC

## 2021-11-13 MED ORDER — BUDESONIDE 0.5 MG/2ML IN SUSP
0.5000 mg | Freq: Two times a day (BID) | RESPIRATORY_TRACT | Status: DC
  Administered 2021-11-13 – 2021-11-17 (×9): 0.5 mg via RESPIRATORY_TRACT
  Filled 2021-11-13 (×9): qty 2

## 2021-11-13 MED ORDER — MAGNESIUM SULFATE 2 GM/50ML IV SOLN
2.0000 g | Freq: Once | INTRAVENOUS | Status: AC
  Administered 2021-11-13: 2 g via INTRAVENOUS
  Filled 2021-11-13: qty 50

## 2021-11-13 MED ORDER — OSMOLITE 1.2 CAL PO LIQD
1000.0000 mL | ORAL | Status: DC
  Administered 2021-11-13 – 2021-11-15 (×3): 1000 mL

## 2021-11-13 MED ORDER — ROSUVASTATIN CALCIUM 5 MG PO TABS: 10.0000 mg | ORAL_TABLET | Freq: Every day | ORAL | Status: DC

## 2021-11-13 MED ORDER — POTASSIUM CHLORIDE 10 MEQ/100ML IV SOLN
10.0000 meq | INTRAVENOUS | Status: AC
  Administered 2021-11-13 (×4): 10 meq via INTRAVENOUS
  Filled 2021-11-13 (×4): qty 100

## 2021-11-13 MED ORDER — SODIUM CHLORIDE 0.9 % IV SOLN
250.0000 mL | INTRAVENOUS | Status: DC
Start: 2021-11-13 — End: 2021-11-25
  Administered 2021-11-13 – 2021-11-16 (×3): 250 mL via INTRAVENOUS

## 2021-11-13 MED ORDER — GADOBUTROL 1 MMOL/ML IV SOLN
4.0000 mL | Freq: Once | INTRAVENOUS | Status: AC | PRN
  Administered 2021-11-13: 4 mL via INTRAVENOUS

## 2021-11-13 MED ORDER — FLUTICASONE FUROATE-VILANTEROL 100-25 MCG/ACT IN AEPB
1.0000 | INHALATION_SPRAY | Freq: Every day | RESPIRATORY_TRACT | Status: DC
  Filled 2021-11-13: qty 28

## 2021-11-13 MED ORDER — PROSOURCE TF PO LIQD: 45.0000 mL | Freq: Two times a day (BID) | ORAL | Status: DC

## 2021-11-13 MED ORDER — NOREPINEPHRINE 4 MG/250ML-% IV SOLN: 0.0000 ug/min | INTRAVENOUS | Status: DC

## 2021-11-13 MED ORDER — ARFORMOTEROL TARTRATE 15 MCG/2ML IN NEBU
15.0000 ug | INHALATION_SOLUTION | Freq: Two times a day (BID) | RESPIRATORY_TRACT | Status: DC
Start: 2021-11-13 — End: 2021-11-17
  Administered 2021-11-13 – 2021-11-17 (×9): 15 ug via RESPIRATORY_TRACT
  Filled 2021-11-13 (×9): qty 2

## 2021-11-13 MED ORDER — THIAMINE HCL 100 MG PO TABS
100.0000 mg | ORAL_TABLET | Freq: Every day | ORAL | Status: DC
  Administered 2021-11-13 – 2021-11-16 (×4): 100 mg
  Filled 2021-11-13 (×4): qty 1

## 2021-11-13 MED ORDER — LEVOTHYROXINE SODIUM 88 MCG PO TABS
88.0000 ug | ORAL_TABLET | Freq: Every day | ORAL | Status: DC
  Administered 2021-11-13 – 2021-11-16 (×4): 88 ug
  Filled 2021-11-13 (×4): qty 1

## 2021-11-13 MED ORDER — IPRATROPIUM-ALBUTEROL 0.5-2.5 (3) MG/3ML IN SOLN: 3.0000 mL | RESPIRATORY_TRACT | Status: DC | PRN

## 2021-11-13 MED ORDER — CALCIUM GLUCONATE-NACL 1-0.675 GM/50ML-% IV SOLN
1.0000 g | Freq: Once | INTRAVENOUS | Status: AC
  Administered 2021-11-13: 1000 mg via INTRAVENOUS
  Filled 2021-11-13: qty 50

## 2021-11-13 MED ORDER — BUDESON-GLYCOPYRROL-FORMOTEROL 160-9-4.8 MCG/ACT IN AERO: 2.0000 | INHALATION_SPRAY | Freq: Two times a day (BID) | RESPIRATORY_TRACT | Status: DC

## 2021-11-13 MED ORDER — REVEFENACIN 175 MCG/3ML IN SOLN
175.0000 ug | Freq: Every day | RESPIRATORY_TRACT | Status: DC
  Administered 2021-11-13 – 2021-11-17 (×5): 175 ug via RESPIRATORY_TRACT
  Filled 2021-11-13 (×5): qty 3

## 2021-11-13 MED ORDER — ADULT MULTIVITAMIN W/MINERALS CH
1.0000 | ORAL_TABLET | Freq: Every day | ORAL | Status: DC
  Administered 2021-11-13 – 2021-11-16 (×4): 1
  Filled 2021-11-13 (×4): qty 1

## 2021-11-13 NOTE — Progress Notes (Signed)
Spoke with pt's nephew on the phone.  He lives out of state and is planning to come to see pt tomorrow.  He gave me pt's 1/2 sister's phone number that lives in [place].  Her name is [name], [phone].   ?

## 2021-11-13 NOTE — Progress Notes (Signed)
PT Cancellation Note ? ?Patient Details ?Name: Kendra Watts ?MRN: [id number] ?DOB: [birth year] ? ? ?Cancelled Treatment:    Reason Eval/Treat Not Completed: Active bedrest order (and intubated). Will follow as appropriate.  ? ?Leighton Roach, PT  ?Acute Rehab Services ? Pager [phone] ?Office [phone] ? ? ? ?[place] ?11/13/2021, 8:37 AM ?

## 2021-11-13 NOTE — Progress Notes (Signed)
?  Transition of Care (TOC) Screening Note ? ? ?Patient Details  ?Name: Kendra Watts ?Date of Birth: 1945-12-24 ? ? ?Transition of Care [facility]) CM/SW Contact:    ?Ella Bodo, RN ?Phone Number: ?11/13/2021, 4:59 PM ? ? ? ?Transition of Care Department [facility]) has reviewed patient and no TOC needs have been identified at this time. We will continue to monitor patient advancement through interdisciplinary progression rounds. If new patient transition needs arise, please place a TOC consult. ? ?Reinaldo Raddle, RN, BSN  ?Trauma/Neuro ICU Case Manager ?[phone] ? ?

## 2021-11-13 NOTE — Assessment & Plan Note (Addendum)
Intubated for airway protection.  Minimal oxygen requirements. ?Tolerating SBT but copious secretions - clear.  More awake today. ? ?-Bronchodilators, diuresed yesterday, started on steroids for COPD exacerbation.  Started on empiric antibiotics. ?-Successfully extubated.  With no distress ?

## 2021-11-13 NOTE — Procedures (Addendum)
Patient Name: Kendra Watts  ?MRN: [id number]  ?Epilepsy Attending: Lora Havens  ?Referring Physician/Provider: Lorenza Chick, MD ?Duration: 11/12/2021 1807 to 11/13/2021 1018 ?  ?Patient history: 76 y.o. female with PMH HTN, HLD, CHF, hypothyroidism, non-functional left kidney, breast cancer, lung adenocarcinoma, ongoing tobacco abuse, COPD on 3 L home O2 who presented to [place] (11/12/2021) for Code stroke after episode of staring and witnessed fall. EEG to evaluate for seizure ?  ?Level of alertness:  comatose ?  ?AEDs during EEG study: LEV, versed, propofol ?  ?Technical aspects: This EEG study was done with scalp electrodes positioned according to the 10-20 International system of electrode placement. Electrical activity was acquired at a sampling rate of 500Hz  and reviewed with a high frequency filter of 70Hz  and a low frequency filter of 1Hz . EEG data were recorded continuously and digitally stored.  ?  ?Description: EEG showed continuous generalized predominantly 5 to 9 Hz theta-alpha activity admixed with intermittent generalized low amplitude 15 to 18 Hz beta activity.  Hyperventilation and photic stimulation were not performed.    ?  ?ABNORMALITY ?-Continuous slow, generalized ?  ?IMPRESSION: ?This study is suggestive of severe diffuse encephalopathy, nonspecific etiology but likely secondary to sedation. No seizures or epileptiform discharges were seen throughout the recording. ?  ?Lora Havens  ?

## 2021-11-13 NOTE — Progress Notes (Signed)
An USGPIV (ultrasound guided PIV) has been placed Right forearm 24g for short-term vasopressor infusion. A correctly placed ivWatch must be used when administering Vasopressors. Should this treatment be needed beyond 72 hours, central line access should be obtained.  It will be the responsibility of the bedside nurse to follow best practice to prevent extravasations.   ?

## 2021-11-13 NOTE — Progress Notes (Signed)
Notified Elink of critical K+ value from ABG.  K+= 2.6.  STAT BMP ordered.  Pt still remains below BP goal of 130-150.  No new orders from CCM at this time.  RN will notify Neuro of BP.  ?

## 2021-11-13 NOTE — Assessment & Plan Note (Addendum)
-  Swallow evaluation and resume enteral nutrition. ?

## 2021-11-13 NOTE — Assessment & Plan Note (Signed)
CT neck shows no cervical fracture. ? ?-C-collar removed. ?

## 2021-11-13 NOTE — TOC CAGE-AID Note (Signed)
Transition of Care (TOC) - CAGE-AID Screening ? ? ?Patient Details  ?Name: Kendra Watts ?MRN: [id number] ?Date of Birth: 1946-07-04 ? ?Transition of Care (TOC) CM/SW Contact:    ?Gwenivere Hiraldo C Tarpley-Carter, LCSWA ?Phone Number: ?11/13/2021, 2:16 PM ? ? ?Clinical Narrative: ?Pt is unable to participate in Cage Aid. ?Pt is unresponsive. ? ?Passenger transport manager, MSW, LCSW-A ?Pronouns:  She/Her/Hers ?[facility] ?Clinical Social Worker ?Direct Number:  [phone] ?[email or web address] ? ?CAGE-AID Screening: ?Substance Abuse Screening unable to be completed due to: : Patient unable to participate ? ?  ?  ?  ?  ?  ? ?  ? ?  ? ? ? ? ? ? ?

## 2021-11-13 NOTE — Progress Notes (Signed)
RT transported patient to MRI and back with RN. No complications. RT will continue to monitor.  ?

## 2021-11-13 NOTE — Progress Notes (Signed)
[facility] ADULT ICU REPLACEMENT PROTOCOL ? ? ?The patient does apply for the [facility] Adult ICU Electrolyte Replacment Protocol based on the criteria listed below:  ? ?1.Exclusion criteria: TCTS patients, ECMO patients, and Dialysis patients ?2. Is GFR >/= 30 ml/min? Yes.    ?Patient's GFR today is >60 ?3. Is SCr </= 2? Yes.   ?Patient's SCr is 0.75 mg/dL ?4. Did SCr increase >/= 0.5 in 24 hours? No. ?5.Pt's weight >40kg  Yes.   ?6. Abnormal electrolyte(s): K+ 2.9  ?7. Electrolytes replaced per protocol ?8.  Call MD STAT for K+ </= 2.5, Phos </= 1, or Mag </= 1 ?Physician:  n/a ? ?Kendra Watts 11/13/2021 2:31 AM ? ?

## 2021-11-13 NOTE — Assessment & Plan Note (Addendum)
Witnessed tonic-clonic seizure by EMS.  Given benzodiazepine loaded with Keppra.  Intubated in ED. ?No further seizures on EEG, starting to move purposefully  ? ?-Stop sedation ?-More awake today but only intermittently following commands. Mental status not yet adequate for extubation.   ?

## 2021-11-13 NOTE — Progress Notes (Signed)
Notified Elink of Pt's hypotension.  Pt's current BP 82/64 (71).  Cleviprex has been off since 2152.  Decreased Fentanyl.  Pt's pressure remains low.  No new orders at this time.   ?

## 2021-11-13 NOTE — Progress Notes (Signed)
EEG leads now discontinued and MRI scheduled for 1430.  ? ?1415 per MRI, rescheduled for 1530. ?

## 2021-11-13 NOTE — Progress Notes (Signed)
EEG lead corrected. Hair netting adjusted and  placed. ?

## 2021-11-13 NOTE — Progress Notes (Signed)
SLP Cancellation Note ? ?Patient Details ?Name: Kendra Watts ?MRN: [id number] ?DOB: 30-May-1946 ? ? ?Cancelled treatment:       Reason Eval/Treat Not Completed: Patient not medically ready (on vent). Will f/u as able. ? ? ? ?Osie Bond., M.A. CCC-SLP ?Acute Rehabilitation Services ?Office [phone] ? ?Secure chat preferred ? ?11/13/2021, 8:22 AM ?

## 2021-11-13 NOTE — Assessment & Plan Note (Addendum)
In an unusual location likely related to seizures.  MRI negative. ? ? ?

## 2021-11-13 NOTE — Assessment & Plan Note (Addendum)
Altered mental status related to seizure activity. ?History of malignancy raises possibility of cerebral metastases, but MRI negative. ? ?- Keep sedation off. Low dose fentanyl for cough.  ?

## 2021-11-13 NOTE — Progress Notes (Addendum)
Initial Nutrition Assessment ? ?DOCUMENTATION CODES:  ? ?Severe malnutrition in context of chronic illness, Underweight ? ?INTERVENTION:  ? ?Initiate tube feeding via OG tube: ?Osmolite 1.2 at 25 ml/h and increase by 10 ml every 8 hours to goal rate of 45 ml/h (1080 ml per day) ? ?Provides 1296 kcal, 60 gm protein, 875 ml free water daily ? ?Monitor magnesium and phosphorus every 12 hours x 4 occurrences, MD to replete as needed, as pt is at risk for refeeding syndrome given severe malnutrition. ? ?Recommend 100 mg thiamine daily, message sent  ? ?NUTRITION DIAGNOSIS:  ? ?Severe Malnutrition related to chronic illness (cancer) as evidenced by severe fat depletion, severe muscle depletion. ? ?GOAL:  ? ?Patient will meet greater than or equal to 90% of their needs ? ?MONITOR:  ? ?Vent status, TF tolerance ? ?REASON FOR ASSESSMENT:  ? ?Consult ?Enteral/tube feeding initiation and management ? ?ASSESSMENT:  ? 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  ? ?Pt discussed during ICU rounds and with RN. Per RN pt with friend who helps with grocery shopping and cleaning her house. No visitors present at this time. Nephew [place].  ? ?Medications reviewed and include: SSI, synthroid, protoinx, senokot-s  ?Versed ?Levophed @ 1 mcg  ? ?Labs reviewed: K 2.9 ?  ?16 F OG tube; looped in stomach  ? ?NUTRITION - FOCUSED PHYSICAL EXAM: ? ?Flowsheet Row Most Recent Value  ?Orbital Region Severe depletion  ?Upper Arm Region Severe depletion  ?Thoracic and Lumbar Region Severe depletion  ?Buccal Region Severe depletion  ?Temple Region Moderate depletion  ?Clavicle Bone Region Severe depletion  ?Clavicle and Acromion Bone Region Severe depletion  ?Scapular Bone Region Severe depletion  ?Dorsal Hand Severe  depletion  ?Patellar Region Severe depletion  ?Anterior Thigh Region Severe depletion  ?Posterior Calf Region Severe depletion  ?Edema (RD Assessment) None  ?Hair Reviewed  ?Eyes Unable to assess  ?Mouth Unable to assess  ?Skin Reviewed  ?Nails Reviewed  ? ?  ? ? ?Diet Order:   ?Diet Order   ? ?       ?  Diet NPO time specified  Diet effective now       ?  ? ?  ?  ? ?  ? ? ?EDUCATION NEEDS:  ? ?Not appropriate for education at this time ? ?Skin:  Skin Assessment: Reviewed RN Assessment ? ?Last BM:  4/25 smear ? ?Height:  ? ?Ht Readings from Last 1 Encounters:  ?11/12/21 5' (1.524 m)  ? ? ?Weight:  ? ?Wt Readings from Last 1 Encounters:  ?11/12/21 38.4 kg  ? ? ?BMI:  Body mass index is 16.53 kg/m?. ? ?Estimated Nutritional Needs:  ? ?Kcal:  1200-1400 ? ?Protein:  60-75 grams ? ?Fluid:  >1.5 L/day ? ?Lockie Pares., RD, LDN, CNSC ?See AMiON for contact information  ? ?

## 2021-11-13 NOTE — Consult Note (Signed)
? ?NAME:  Kendra Watts, MRN:  [id number], DOB:  16-Mar-1946, LOS: 1 ?ADMISSION DATE:  11/12/2021 CONSULTATION DATE:  11/12/2021 ?REFERRING MD:  [place] - Neuro CHIEF COMPLAINT:  AMS, seizure activity  ? ?History of Present Illness:  ?76 year old woman who presented to [facility] ED 4/24 via EMS for Code Stroke due to AMS, seizure activity. [address] ~1500 when patient was found down. PMHx significant for HTN, HLD, CHF (Echo 04/2020 LVEF 60-65%,), COPD (on 3L home O2), RAS with nonfunctioning L kidney, breast CA (s/p radiation 2017-2020), lung adenocarcinoma. ? ?Per chart review, patient was found down at home around 1500 by a friend; she was down minutes at most prior to being discovered.  Tonic-clonic seizure-like activity was noted en route with EMS and patient was unresponsive on arrival to ED with ongoing tonic BUE seizure activity.  While in ED, patient was intubated for airway protection and was noted to have posturing/biting ETT. Code Stroke initiated; Neurology was consulted and EEG was initiated with Keppra load, propofol/Versed drips.  Patient was noted to be hypertensive and clevidipine drip was started. CT Head showed small acute parenchymal hemorrhage in the left septal lobe, unlikely causing patient's symptoms.  CTA Head/Neck was grossly unremarkable , C-spine negative for fracture. ? ?PCCM was consulted for ventilator management. ? ?Pertinent Medical History:  ? ?Past Medical History:  ?Diagnosis Date  ? Anemia   ? 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 Breast cancer ([place]) 09/22/2015  ? Malignant  ? Breast cancer of upper-outer quadrant of left female breast ([place]) 09/08/2015  ? Cataract, immature   ? CHF (congestive heart failure) ([place])   ? Acute CHF-06/2018  ? COPD (chronic obstructive pulmonary disease) ([place])   ? Femur fracture, left ([place]) 04/11/2020  ? left femur fracture( greater trochanter  ? Heart murmur   ? states no known problems  ? History of hyperthyroidism   ? History of radiation therapy   ?  bilateral lungs - 10/27/18-11/02/18, Dr. [name]  ? History of radiation therapy 11/28/2015  ? left breast 10/30/2015-11/28/2015   Dr [name]  ? Hyperlipidemia   ? Hypertension   ? states under control with meds., has been on med. x "long time"  ? Hypokalemia   ? from last physical.   ? Hypothyroidism   ? Nonfunctioning kidney   ? left  ? Personal history of radiation therapy   ? 2017  ? Pulmonary nodules   ? Bilateral  ? Radiation 10/30/15-11/28/15  ? left breast 42.72 Gy, boosted to 10 Gy  ? Renal artery stenosis ([place])   ? Tobacco abuse   ? Wears partial dentures   ? upper and lower  ? ?Significant Hospital Events: ?Including procedures, antibiotic start and stop dates in addition to other pertinent events   ?4/24 - Admitted via [facility] ED for AMS, seizure-like activity. CT Head with small L occipital parenchymal hemorrhage, CTA Head/Neck without LVO. Neuro c/s, EEG/AEDs started. ? ?Interim History / Subjective:  ?Comfortable. Sedated. EEG ongoing. ? ? ?Objective:  ?Blood pressure (!) 143/96, pulse 77, temperature 97.7 ?F (36.5 ?C), resp. rate 20, height 5' (1.524 m), weight 38.4 kg, SpO2 100 %. ?   ?Vent Mode: PRVC ?FiO2 (%):  [40 %-100 %] 40 % ?Set Rate:  [18 bmp-22 bmp] 20 bmp ?Vt Set:  [360 mL-400 mL] 360 mL ?PEEP:  [5 cmH20] 5 cmH20 ?Plateau Pressure:  [13 cmH20-19 cmH20] 17 cmH20  ? ?Intake/Output Summary (Last 24 hours) at 11/13/2021 0758 ?Last data  filed at 11/13/2021 0700 ?Gross per 24 hour  ?Intake 1855.32 ml  ?Output 610 ml  ?Net 1245.32 ml  ? ? ?Filed Weights  ? 11/12/21 1613 11/12/21 1930  ?Weight: 37.6 kg 38.4 kg  ? ?Physical Examination: ?General: Elderly female, chronically ill appearing, frail, resting in bed, in NAD. ?Neuro: Sedated, not responsive. ?HEENT: [place]/AT. Sclerae anicteric. ETT in place. ?Cardiovascular: RRR, no M/R/G.  ?Lungs: Respirations even and unlabored.  CTA bilaterally, No W/R/R. ?Abdomen: BS x 4, soft, NT/ND.  ?Musculoskeletal: No gross deformities, no edema.  ?Skin: Intact, warm,  no rashes. ? ? ? ?Assessment & Plan:  ? ?Status epilepticus ?Altered mental status in the setting of seizures ?Admitted via [facility] ED 4/24 for AMS, unresponsiveness and seizure-like activity. CT Head showed small acute parenchymal hemorrhage in the left occipital lobe (unlikely causing patient's symptoms). CTA Head/Neck was grossly unremarkable , C-spine negative for fracture. ?- Neurology managing as primary service ?- AEDs per Neuro; continuing Keppra, Propofol, Versed gtt ?- LTM EEG in place ?- Additional brain imaging per Neuro ?- Seizure precautions ?- Neuroprotective measures: HOB > 30 degrees, normoglycemia, normothermia, electrolytes WNL ? ?Acute hypoxemic respiratory failure ?COPD, on 3L home O2 therapy ?- Continue full vent support ?- Daily WUA/SBT as mental status tolerates, currently precluded by ongoing seizure activity and high sedation needs ?- VAP bundle ?- Pulmonary hygiene ? ?Hypertension ?Home regimen includes: Lopressor, Cardura, Lasix. ?- Clevidipine gtt titrated to goal SBP (was held overnight and did require low dose Levo) ?- Goal SBP 130 - 150 ?- Holding home meds for now ? ?Hypokalemia, Hypocalcemia - s/p repletion. ?- F/u BMP ? ? ?Remainder per Primary Team ? ?Best Practice: (right click and "Reselect all SmartList Selections" daily)  ?Per primary team. ? ? ?Critical care time: 30 minutes  ? ? ?Montey Hora, PA - C ?[facility] ?For pager details, please see AMION or use Epic chat  ?After 1900, please call [facility] for cross coverage needs ?11/13/2021, 8:07 AM ? ?

## 2021-11-13 NOTE — Plan of Care (Signed)
?  Problem: Education: ?Goal: Knowledge of disease or condition will improve ?Outcome: Progressing ?  ?Problem: Health Behavior/Discharge Planning: ?Goal: Ability to manage health-related needs will improve ?Outcome: Progressing ?  ?Problem: Nutrition: ?Goal: Risk of aspiration will decrease ?Outcome: Progressing ?  ?Problem: Intracerebral Hemorrhage Tissue Perfusion: ?Goal: Complications of Intracerebral Hemorrhage will be minimized ?Outcome: Progressing ?  ?

## 2021-11-13 NOTE — Progress Notes (Signed)
LTM EEG D/C'd patient had no skin breakdown from the leads. [facility] was notified.  ?

## 2021-11-13 NOTE — Progress Notes (Signed)
eLink Physician-Brief Progress Note ?Patient Name: LYNAE PEDERSON ?DOB: Aug 15, 1945 ?MRN: [id number] ? ? ?Date of Service ? 11/13/2021  ?HPI/Events of Note ? BP below target of 130 - 150 mmHg, Ionized calcium 1.11, PH 7.50, RR 22.  ?eICU Interventions ? Peripheral Norepinephrine gtt ordered to keep BP within parameters, Calcium gluconate 1 gm iv x 1 ordered, RR reduced to 20 and ABG ordered for 1 hour after change.  ? ? ? ?  ? ?[name] Jatavion Peaster ?11/13/2021, 1:24 AM ?

## 2021-11-13 NOTE — Progress Notes (Addendum)
STROKE TEAM PROGRESS NOTE  ? ?INTERVAL HISTORY ?Kendra Watts is a 76 year old female with a PMHx of HTN, HLD, CHF, hypothyroidism, non-functional left kidney, breast cancer, lung adenocarcinoma, tobacco abuse, COPD on 3 L home O2. She presented to the emergency department after a fall followed by L tonic-clonic activity witnessed by EMS. She was intubated on arrival to the ED d/t decreased mental status. Keppra loaded and continued on 750 mg BID. CTH remarkable for acute hemorrhage in the L occipital lobe. Repeat CTH with stable hemorrhage size. LTM has not shown any electrographic seizure.  ? ?LTM D/C'd this AM, patient to be sent for MRI. On exam the patient is heavily sedated on propofol, Versed and fentanyl drips unresponsive to voice but moves her lower extremities in response to pain. Positive pupillary, corneal, and oculocephalic reflexes.   ? ?Vitals:  ? 11/13/21 1040 11/13/21 1045 11/13/21 1050 11/13/21 1055  ?BP: 128/79 118/84 113/79 127/80  ?Pulse: 89 90 93 93  ?Resp: 20 20 20  (!) 23  ?Temp: 98.4 ?F (36.9 ?C) 98.6 ?F (37 ?C) 98.6 ?F (37 ?C) (!) 96.8 ?F (36 ?C)  ?TempSrc:      ?SpO2: 100% 100% 100% 100%  ?Weight:      ?Height:      ? ?CBC:  ?Recent Labs  ?Lab 11/12/21 ?1610 11/12/21 ?1738 11/12/21 ?2357  ?WBC 10.0  --   --   ?NEUTROABS 6.7  --   --   ?HGB 11.5* 11.6* 10.5*  ?HCT 37.1 34.0* 31.0*  ?MCV 97.4  --   --   ?PLT 201  --   --   ? ?Basic Metabolic Panel:  ?Recent Labs  ?Lab 11/12/21 ?1610 11/12/21 ?1738 11/12/21 ?1825 11/12/21 ?2357 11/13/21 ?0136  ?NA 138   < >  --  138 137  ?K 3.6   < >  --  2.6* 2.9*  ?CL 96*  --   --   --  99  ?CO2 29  --   --   --  27  ?GLUCOSE 241*  --   --   --  82  ?BUN 9  --   --   --  7*  ?CREATININE 0.88  --   --   --  0.75  ?CALCIUM 8.9  --   --   --  8.4*  ?MG  --   --  1.8  --   --   ? < > = values in this interval not displayed.  ? ?Lipid Panel:  ?Recent Labs  ?Lab 11/12/21 ?1825  ?CHOL 152  ?TRIG 74  ?HDL 79  ?CHOLHDL 1.9  ?VLDL 15  ?[place] 58  ? ?HgbA1c:  ?Recent  Labs  ?Lab 11/12/21 ?1610  ?HGBA1C 5.6  ? ?Urine Drug Screen:  ?Recent Labs  ?Lab 11/12/21 ?0998  ?LABOPIA NONE DETECTED  ?COCAINSCRNUR NONE DETECTED  ?LABBENZ POSITIVE*  ?AMPHETMU NONE DETECTED  ?THCU NONE DETECTED  ?LABBARB NONE DETECTED  ?  ?Alcohol Level No results for input(s): ETH in the last 168 hours. ? ?IMAGING past 24 hours ?CT HEAD WO CONTRAST (5MM) ? ?Result Date: 11/12/2021 ?CLINICAL DATA:  Stroke follow-up 6 hour stability scan. EXAM: CT HEAD WITHOUT CONTRAST TECHNIQUE: Contiguous axial images were obtained from the base of the skull through the vertex without intravenous contrast. RADIATION DOSE REDUCTION: This exam was performed according to the departmental dose-optimization program which includes automated exposure control, adjustment of the mA and/or kV according to patient size and/or use of iterative reconstruction technique. COMPARISON:  CT head dated November 12, 2021 at 4:37 p.m. FINDINGS: Brain: Small acute parenchymal hemorrhage in the left occipital lobe adjacent to the left tentorium with trace hyperdensity along the tentorium are unchanged. No significant surrounding edema or midline shift. No enlarging or new hemorrhage. Cerebral atrophy and advanced chronic microvascular ischemic changes of the white matter. Vascular: No hyperdense vessel or unexpected calcification. Skull: Normal. Negative for fracture or focal lesion. Sinuses/Orbits: No acute finding. Other: None. IMPRESSION: 1. Stable left occipital parenchymal hemorrhage with adjacent small subdural hemorrhage along the tentorium. No enlarging or new hemorrhage. 2. Cerebral atrophy and chronic microvascular ischemic changes of the white matter. Electronically Signed   By: Keane Police D.O.   On: 11/12/2021 22:40  ? ?CT C-SPINE NO CHARGE ? ?Result Date: 11/12/2021 ?CLINICAL DATA:  No indication given EXAM: CT CERVICAL SPINE WITHOUT CONTRAST TECHNIQUE: Multidetector CT imaging of the cervical spine was performed without intravenous  contrast. Multiplanar CT image reconstructions were also generated. RADIATION DOSE REDUCTION: This exam was performed according to the departmental dose-optimization program which includes automated exposure control, adjustment of the mA and/or kV according to patient size and/or use of iterative reconstruction technique. COMPARISON:  CT cervical spine 04/08/2020, CT chest 07/27/2021 FINDINGS: Alignment: Exaggeration of the normal cervical lordosis. No listhesis. Skull base and vertebrae: Pathologic fracture of T3, which appears unchanged compared to the 07/27/2021 CT of the chest. No other acute fracture or suspicious osseous lesion. Soft tissues and spinal canal: No prevertebral fluid or swelling. No visible canal hematoma. Endotracheal and orogastric tubes are noted. Disc levels: High-grade spinal canal stenosis or neural foraminal narrowing. Upper chest: Fiducial markers in the right upper lobe, with postradiation fibrosis. Focal pulmonary opacity or pleural effusion. Other: None. IMPRESSION: 1. No acute fracture or traumatic listhesis in the cervical spine. 2. Redemonstrated T3 pathologic compression fracture, which is unchanged compared to the 07/27/2021 chest CT. Electronically Signed   By: Merilyn Baba M.D.   On: 11/12/2021 21:49  ? ?DG Chest Port 1 View ? ?Result Date: 11/12/2021 ?CLINICAL DATA:  Post intubation and EG tube placement in a 76 year old female. EXAM: PORTABLE CHEST 1 VIEW COMPARISON:  May 20, 2020. FINDINGS: EKG leads project over the chest. Endotracheal tube terminates 3.6 cm from the carina. Gastric tube coiled in the stomach. Image rotated the LEFT. Accounting for this cardiomediastinal contours and hilar structures are stable. Post treatment changes with bandlike morphology in the LEFT upper lobe with surgical clips and fiducial markers about the chest as on the prior chest CT from January of 2023. No lobar consolidation. No pneumothorax or evidence of pleural effusion. On limited  assessment there is no acute skeletal process. IMPRESSION: 1. Endotracheal tube 3.6 cm from the carina. Gastric tube coiled in the stomach. 2. Post treatment changes in the LEFT upper chest as before. No acute findings by radiograph. Electronically Signed   By: Zetta Bills M.D.   On: 11/12/2021 16:36  ? ?EEG adult ? ?Result Date: 11/12/2021 ?Lora Havens, MD     11/12/2021  9:00 PM Patient Name: Kendra Watts MRN: [id number] Epilepsy Attending: Lora Havens Referring Physician/Provider: Lorenza Chick, MD Date: 11/12/2021 Duration: 21.26 mins Patient history:  76 y.o. female with PMH HTN, HLD, CHF, hypothyroidism, non-functional left kidney, breast cancer, lung adenocarcinoma, ongoing tobacco abuse, COPD on 3 L home O2 who presented to [facility] (11/12/2021) for Code stroke after episode of staring and witnessed fall. EEG to evaluate for seizure Level of alertness:  comatose AEDs during  EEG study: LEV, versed, propofol Technical aspects: This EEG study was done with scalp electrodes positioned according to the 10-20 International system of electrode placement. Electrical activity was acquired at a sampling rate of 500Hz  and reviewed with a high frequency filter of 70Hz  and a low frequency filter of 1Hz . EEG data were recorded continuously and digitally stored. Description: EEG showed continuous generalized and  lateralized right hemisphere 3 to 6 Hz theta-delta slowing. Hyperventilation and photic stimulation were not performed.   ABNORMALITY - Continuous slow, generalized  and  lateralized right hemisphere IMPRESSION: This study is suggestive of cortical dysfunction arising from right hemisphere, likely secondary to underlying bleed. Additionally, there is severe diffuse encephalopathy, nonspecific etiology. No seizures or epileptiform discharges were seen throughout the recording. Priyanka Barbra Sarks  ? ?Overnight EEG with video ? ?Result Date: 11/13/2021 ?Lora Havens, MD     11/13/2021  9:21 AM Patient  Name: Kendra Watts MRN: [id number] Epilepsy Attending: Lora Havens Referring Physician/Provider: Lorenza Chick, MD Duration: 11/12/2021 1807 to 11/13/2021 0920  Patient history: 76 y.o. female with PMH

## 2021-11-14 ENCOUNTER — Telehealth: Payer: Medicare Other | Admitting: Gastroenterology

## 2021-11-14 DIAGNOSIS — R569 Unspecified convulsions: Secondary | ICD-10-CM | POA: Diagnosis not present

## 2021-11-14 DIAGNOSIS — G9340 Encephalopathy, unspecified: Secondary | ICD-10-CM | POA: Diagnosis not present

## 2021-11-14 DIAGNOSIS — J9601 Acute respiratory failure with hypoxia: Secondary | ICD-10-CM | POA: Diagnosis not present

## 2021-11-14 LAB — BASIC METABOLIC PANEL
Anion gap: 6 (ref 5–15)
BUN: 8 mg/dL (ref 8–23)
CO2: 29 mmol/L (ref 22–32)
Calcium: 8.8 mg/dL — ABNORMAL LOW (ref 8.9–10.3)
Chloride: 103 mmol/L (ref 98–111)
Creatinine, Ser: 0.63 mg/dL (ref 0.44–1.00)
GFR, Estimated: >60 mL/min (ref >60)
Glucose, Bld: 121 mg/dL — ABNORMAL HIGH (ref 70–99)
Potassium: 4.1 mmol/L (ref 3.5–5.1)
Sodium: 138 mmol/L (ref 135–145)

## 2021-11-14 LAB — CBC
HCT: 32.5 % — ABNORMAL LOW (ref 36.0–46.0)
Hemoglobin: 10.7 g/dL — ABNORMAL LOW (ref 12.0–15.0)
MCH: 30.3 pg (ref 26.0–34.0)
MCHC: 32.9 g/dL (ref 30.0–36.0)
MCV: 92.1 fL (ref 80.0–100.0)
Platelets: 162 10*3/uL (ref 150–400)
RBC: 3.53 MIL/uL — ABNORMAL LOW (ref 3.87–5.11)
RDW: 15.3 % (ref 11.5–15.5)
WBC: 9.4 10*3/uL (ref 4.0–10.5)
nRBC: 0 % (ref 0.0–0.2)

## 2021-11-14 LAB — MAGNESIUM
Magnesium: 2.1 mg/dL (ref 1.7–2.4)
Magnesium: 1.9 mg/dL (ref 1.7–2.4)

## 2021-11-14 LAB — GLUCOSE, CAPILLARY
Glucose-Capillary: 115 mg/dL — ABNORMAL HIGH (ref 70–99)
Glucose-Capillary: 105 mg/dL — ABNORMAL HIGH (ref 70–99)
Glucose-Capillary: 151 mg/dL — ABNORMAL HIGH (ref 70–99)
Glucose-Capillary: 136 mg/dL — ABNORMAL HIGH (ref 70–99)
Glucose-Capillary: 126 mg/dL — ABNORMAL HIGH (ref 70–99)

## 2021-11-14 LAB — PHOSPHORUS
Phosphorus: 4.6 mg/dL (ref 2.5–4.6)
Phosphorus: 4.3 mg/dL (ref 2.5–4.6)

## 2021-11-14 MED ORDER — POLYETHYLENE GLYCOL 3350 17 G PO PACK
17.0000 g | PACK | Freq: Every day | ORAL | Status: DC
  Administered 2021-11-14 – 2021-11-15 (×2): 17 g
  Filled 2021-11-14 (×2): qty 1

## 2021-11-14 MED ORDER — DOCUSATE SODIUM 50 MG/5ML PO LIQD
100.0000 mg | Freq: Two times a day (BID) | ORAL | Status: DC
  Administered 2021-11-14 – 2021-11-15 (×3): 100 mg
  Filled 2021-11-14 (×3): qty 10

## 2021-11-14 MED ORDER — FENTANYL CITRATE PF 50 MCG/ML IJ SOSY: 25.0000 ug | PREFILLED_SYRINGE | INTRAMUSCULAR | Status: DC | PRN

## 2021-11-14 MED ORDER — PANTOPRAZOLE 2 MG/ML SUSPENSION
40.0000 mg | Freq: Every day | ORAL | Status: DC
Start: 2021-11-14 — End: 2021-11-17
  Administered 2021-11-14 – 2021-11-16 (×3): 40 mg
  Filled 2021-11-14 (×3): qty 20

## 2021-11-14 MED ORDER — FENTANYL CITRATE PF 50 MCG/ML IJ SOSY
25.0000 ug | PREFILLED_SYRINGE | INTRAMUSCULAR | Status: DC | PRN
  Administered 2021-11-14 – 2021-11-15 (×2): 50 ug via INTRAVENOUS
  Filled 2021-11-14 (×2): qty 1

## 2021-11-14 NOTE — Progress Notes (Signed)
PT Cancellation Note ? ?Patient Details ?Name: Kendra Watts ?MRN: [id number] ?DOB: 10-06-1945 ? ? ?Cancelled Treatment:    Reason Eval/Treat Not Completed: Medical issues which prohibited therapy. Pt remains intubated, weaning sedation but unable to follow commands. PT will follow up once the pt is better able to follow commands or is completely weaned from sedation. ? ? ?Zenaida Niece ?11/14/2021, 10:01 AM ?

## 2021-11-14 NOTE — Plan of Care (Signed)
?  Interdisciplinary Goals of Care Family Meeting ? ? ?Date carried out: 11/14/2021 ? ?Location of the meeting: Bedside ? ?Member's involved: Physician, Nurse Practitioner, and Family Member or next of kin ? ?Durable Power of Attorney or acting medical decision maker: Kendra Watts, Nephew   ? ?Discussion: We discussed goals of care for [facility] .  Nephew produced living will stating that the patient would not want to be maintained by artificial means. We have therefore agreed that if the patient were to fail extubation she would not be reintubated.   ? ?Code status: Full DNR, no reintubation. ? ?Disposition: Continue current acute care ? ?Time spent for the meeting: 15 min. ? ? ? ?Kipp Brood, MD ? ?11/14/2021, 1:44 PM ?  ?

## 2021-11-14 NOTE — Progress Notes (Signed)
STROKE TEAM PROGRESS NOTE  ? ?INTERVAL HISTORY ?Patient remains intubated for respiratory failure.  She has had no witnessed seizure activity.  All sedation has been discontinued from this morning but she remains poorly responsive with only some withdrawal movements in the lower extremities.  MRI scan of the brain yesterday showed solitary left posterior medial temporal lobe hematoma with some diffusion changes raising concern for hemorrhagic infarct.  No metastasis or any other lesions were noted. ? ? ? ?Vitals:  ? 11/14/21 1000 11/14/21 1100 11/14/21 1200 11/14/21 1300  ?BP: 126/73 (!) 141/84 130/77 124/69  ?Pulse: (!) 112 (!) 114 100 96  ?Resp: (!) 31 (!) 27 17 18   ?Temp: 100 ?F (37.8 ?C) 99.7 ?F (37.6 ?C) 99.9 ?F (37.7 ?C) 99.7 ?F (37.6 ?C)  ?TempSrc:   Bladder   ?SpO2: 99% 99% 100% 100%  ?Weight:      ?Height:      ? ?CBC:  ?Recent Labs  ?Lab 11/12/21 ?1610 11/12/21 ?1738 11/12/21 ?2357 11/14/21 ?5465  ?WBC 10.0  --   --  9.4  ?NEUTROABS 6.7  --   --   --   ?HGB 11.5*   < > 10.5* 10.7*  ?HCT 37.1   < > 31.0* 32.5*  ?MCV 97.4  --   --  92.1  ?PLT 201  --   --  162  ? < > = values in this interval not displayed.  ? ?Basic Metabolic Panel:  ?Recent Labs  ?Lab 11/13/21 ?2012 11/14/21 ?6812  ?NA 138 138  ?K 4.8 4.1  ?CL 105 103  ?CO2 27 29  ?GLUCOSE 110* 121*  ?BUN 7* 8  ?CREATININE 0.68 0.63  ?CALCIUM 9.0 8.8*  ?MG 2.3 2.1  ?PHOS 4.0 4.6  ? ?Lipid Panel:  ?Recent Labs  ?Lab 11/12/21 ?1825  ?CHOL 152  ?TRIG 74  ?HDL 79  ?CHOLHDL 1.9  ?VLDL 15  ?[place] 58  ? ?HgbA1c:  ?Recent Labs  ?Lab 11/12/21 ?1610  ?HGBA1C 5.6  ? ?Urine Drug Screen:  ?Recent Labs  ?Lab 11/12/21 ?7517  ?LABOPIA NONE DETECTED  ?COCAINSCRNUR NONE DETECTED  ?LABBENZ POSITIVE*  ?AMPHETMU NONE DETECTED  ?THCU NONE DETECTED  ?LABBARB NONE DETECTED  ?  ?Alcohol Level No results for input(s): ETH in the last 168 hours. ? ?IMAGING past 24 hours ?MR BRAIN W WO CONTRAST ? ?Result Date: 11/13/2021 ?CLINICAL DATA:  Brain metastases suspected EXAM: MRI HEAD  WITHOUT AND WITH CONTRAST TECHNIQUE: Multiplanar, multiecho pulse sequences of the brain and surrounding structures were obtained without and with intravenous contrast. CONTRAST:  60mL GADAVIST GADOBUTROL 1 MMOL/ML IV SOLN COMPARISON:  CT head April 24, 23.  MRI September 07, 2018 FINDINGS: Brain: Redemonstrated acute/recent hemorrhage in the left occipital lobe which appears similar in size when comparing across modalities. Surrounding edema without significant mass effect. No evidence of masslike enhancement in this region. No evidence of acute infarct, midline shift, hydrocephalus or sizeable extra-axial fluid collection. Moderate scattered T2/FLAIR hyperintensities within the white matter, nonspecific but compatible with chronic microvascular ischemic disease. No pathologic enhancement. Vascular: Major arterial flow voids are maintained at the skull base. Skull and upper cervical spine: Normal marrow signal. Sinuses/Orbits: Mild pansinus mucosal thickening. No acute orbital findings. Other: Trace bilateral mastoid effusions. IMPRESSION: Redemonstrated acute/recent hemorrhage in the left occipital lobe, likely similar when comparing across modalities. No masslike enhancement in this region to suggest a metastasis; however, acute blood products limits assessment. Given the patient's clinical history, recommend follow-up MRI in approximately 2-3 months to ensure  expected evolution and exclude an underlying lesion. Electronically Signed   By: Margaretha Sheffield M.D.   On: 11/13/2021 17:03   ? ?PHYSICAL EXAM ? ?Physical Exam  ?Constitutional: Frail elderly Caucasian lady.  Who is intubated and not sedated ?Psych: Affect appropriate to situation ?Eyes: No scleral injection ?HENT: No OP obstrucion ?MSK: no joint deformities.  ?Cardiovascular: Normal rate and regular rhythm.  ?Respiratory: Effort normal, non-labored breathing ?Skin: WDL ? ?Neurological Exam limited: Patient is intubated and sedated.  Eyes are closed.   Pupils equal reactive.  Corneal reflexes are present.  Doll's eye movements are sluggish. ?Unresponsive to voice but moves her lower extremities in response to pain. Positive pupillary, corneal, and oculocephalic reflexes.  No focal weakness ? ? ?ASSESSMENT/PLAN ?Kendra Watts is a 76 year old female with a PMHx of HTN, HLD, CHF, hypothyroidism, non-functional left kidney, breast cancer, lung adenocarcinoma, tobacco abuse, COPD on 3 L home O2. She presented to the emergency department after a fall followed by L tonic-clonic activity witnessed by EMS. She was intubated on arrival to the ED d/t decreased mental status. Keppra loaded and continued on 750 mg BID. CTH remarkable for acute hemorrhage in the L occipital lobe. Repeat CTH with stable hemorrhage size. LTM has not shown any electrographic seizure. ? ?Patient's subcortical occipital hemorrhage is in an unusual location for hypertensive ICH possibly hemorrhagic infarct.  No metastasis noted on postcontrast MRI ?MRI: Left occipital hematoma stable.  Some diffusion changes around it.  No metastasis or other lesions noted. ?CTA Head and Neck: 50% stenosis of the mid right common carotid, 50% stenosis of the distal L common carotid artery ?2D Echo: pending ?LDL 58 ?HgbA1c 5.6 ?VTE prophylaxis - SCDs ?   ?Diet  ? Diet NPO time specified  ? ?aspirin 81 mg daily prior to admission, now on No antithrombotic. ICH. ?Therapy recommendations:  TBD ?Disposition:  TBD ? ?Hyperlipidemia ?Home meds:  rosuvastatin 10 mg daily, resumed in hospital, discontinued given ICH ?LDL 58, goal < 70 ?Continue statin at discharge ? ?Glucose control ?Home meds:  NA ?HgbA1c 5.6, goal < 7.0 ?CBGs ?Recent Labs  ?  11/14/21 ?0406 11/14/21 ?0757 11/14/21 ?1108  ?GLUCAP 115* 105* 151*  ?  ?SSI ? ?Other Stroke Risk Factors ?Advanced Age >/= 62  ?Cigarette smoker, will be advised to stop smoking ?ETOH use, will be advised to drink no more than 1 drink(s) a day ? ? ?  ? ?CNS ?-Close neuro  monitoring ?Wean sedation as tolerated ?Continue seizure medications ? ?RESP ?-vent management per ICU ?-wean when able ? ?CV ?SBP 130-160 ?-Cleviprex, Levophed PRN ? ?Fluid/Electrolyte Disorders ?Hypokalemia ?-Replaced, BMP recheck at 1 PM ?-Baily BMP ? ?Nutrition ?E66.9 Obesity  ?E46 Protein-Calorie Malnutrition Mild Moderate Severe ?-diet consult ? ?Code Status: Full Code ? ?Hospital day # 2 ? ? ?Patient presented with repetitive seizures and was intubated and was on heavy sedation which has been stopped this morning expect she will recover gradually over the next couple of days and will be extubated as tolerated. .  Continue Keppra for seizures.  Long discussion at the bedside with patient's nephew and answered questions discussed with Dr. Lynetta Mare critical care medicine.This patient is critically ill and at significant risk of neurological worsening, death and care requires constant monitoring of vital signs, hemodynamics,respiratory and cardiac monitoring, extensive review of multiple databases, frequent neurological assessment, discussion with family, other specialists and medical decision making of high complexity.I have made any additions or clarifications directly to the above note.This critical care  time does not reflect procedure time, or teaching time or supervisory time of PA/NP/Med Resident etc but could involve care discussion time. ? I spent 32 minutes of neurocritical care time  in the care of  this patient. ? ?  ? ? ?Antony Contras, MD ?Medical Director ?[facility] ?Pager: [phone] ?11/14/2021 1:15 PM ? ? ?To contact Stroke Continuity provider, please refer to [email or web address] ?After hours, contact General Neurology ? ?

## 2021-11-14 NOTE — Progress Notes (Signed)
OT Cancellation Note ? ?Patient Details ?Name: Kendra Watts ?MRN: [id number] ?DOB: 03-11-1946 ? ? ?Cancelled Treatment:    Reason Eval/Treat Not Completed: Medical issues which prohibited therapy.  OT to continue efforts as appropriate.   ? ?Ezmeralda Stefanick D Race Latour ?11/14/2021, 11:12 AM ?

## 2021-11-14 NOTE — Progress Notes (Addendum)
? ?NAME:  Kendra Watts, MRN:  [id number], DOB:  [birth year], LOS: 2 ?ADMISSION DATE:  11/12/2021 CONSULTATION DATE:  11/12/2021 ?REFERRING MD:  [place] - Neuro CHIEF COMPLAINT:  AMS, seizure activity  ? ?History of Present Illness:  ?76 year old woman who presented to [facility] ED 4/24 via EMS for Code Stroke due to AMS, seizure activity. [place] ~1500 when patient was found down. PMHx significant for HTN, HLD, CHF (Echo 04/2020 LVEF 60-65%,), COPD (on 3L home O2), RAS with nonfunctioning L kidney, breast CA (s/p radiation 2017-2020), lung adenocarcinoma. ? ?Per chart review, patient was found down at home around 1500 by a friend; she was down minutes at most prior to being discovered.  Tonic-clonic seizure-like activity was noted en route with EMS and patient was unresponsive on arrival to ED with ongoing tonic BUE seizure activity.  While in ED, patient was intubated for airway protection and was noted to have posturing/biting ETT. Code Stroke initiated; Neurology was consulted and EEG was initiated with Keppra load, propofol/Versed drips.  Patient was noted to be hypertensive and clevidipine drip was started. CT Head showed small acute parenchymal hemorrhage in the left septal lobe, unlikely causing patient's symptoms.  CTA Head/Neck was grossly unremarkable , C-spine negative for fracture. ? ?PCCM was consulted for ventilator management. ? ?Pertinent Medical History:  ? ?Past Medical History:  ?Diagnosis Date  ? Anemia   ? 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 Breast cancer ([place]) 09/22/2015  ? Malignant  ? Breast cancer of upper-outer quadrant of left female breast ([place]) 09/08/2015  ? Cataract, immature   ? CHF (congestive heart failure) ([place])   ? Acute CHF-06/2018  ? COPD (chronic obstructive pulmonary disease) ([place])   ? Femur fracture, left ([place]) 04/11/2020  ? left femur fracture( greater trochanter  ? Heart murmur   ? states no known problems  ? History of hyperthyroidism   ? History of radiation therapy   ?  bilateral lungs - 10/27/18-11/02/18, Dr. [name]  ? History of radiation therapy 11/28/2015  ? left breast 10/30/2015-11/28/2015   Dr [name]  ? Hyperlipidemia   ? Hypertension   ? states under control with meds., has been on med. x "long time"  ? Hypokalemia   ? from last physical.   ? Hypothyroidism   ? Nonfunctioning kidney   ? left  ? Personal history of radiation therapy   ? 2017  ? Pulmonary nodules   ? Bilateral  ? Radiation 10/30/15-11/28/15  ? left breast 42.72 Gy, boosted to 10 Gy  ? Renal artery stenosis ([place])   ? Tobacco abuse   ? Wears partial dentures   ? upper and lower  ? ?Significant Hospital Events: ?Including procedures, antibiotic start and stop dates in addition to other pertinent events   ?4/24 - Admitted via [facility] ED for AMS, seizure-like activity. CT Head with small L occipital parenchymal hemorrhage, CTA Head/Neck without LVO. Neuro c/s, EEG/AEDs started. ?4/25 no further seizures LTM stopped. MRI Redemonstrated acute/recent hemorrhage in the left occipital lobe, likely similar when comparing across modalities. No masslike enhancement in this region to suggest a metastasis; however, acute blood products limits assessment. ?4/26 weaning sedation. Tolerating PSV but mental status barrier to extubation. Off Cleviprex  ? ?Interim History / Subjective:  ?Heavily sedated. Still on versed infusion  ? ? ?Objective:  ?Blood pressure 117/75, pulse 88, temperature 98.8 ?F (37.1 ?C), resp. rate 14, height 5' (1.524 m), weight 39.9 kg, SpO2 100 %. ?   ?  Vent Mode: PRVC ?FiO2 (%):  [40 %-50 %] 40 % ?Set Rate:  [20 bmp] 20 bmp ?Vt Set:  [360 mL] 360 mL ?PEEP:  [5 cmH20] 5 cmH20 ?Pressure Support:  [5 cmH20] 5 cmH20 ?Plateau Pressure:  [12 cmH20-17 cmH20] 14 cmH20  ? ?Intake/Output Summary (Last 24 hours) at 11/14/2021 0736 ?Last data filed at 11/14/2021 0700 ?Gross per 24 hour  ?Intake 1109.03 ml  ?Output 635 ml  ?Net 474.03 ml  ? ?Filed Weights  ? 11/12/21 1613 11/12/21 1930 11/14/21 0500  ?Weight: 37.6 kg  38.4 kg 39.9 kg  ? ?Physical Examination: ?General chronically ill appearing female. Sedated on versed gtt ?HENT NCAT MMM orally intubated ?Pulm clear. No accessory use on PSV 8 ?Card tachy RRR  ?Abd soft  ?Ext warm dry  ?Neuro sedated. Has cough w/ sxn attempt but really not much else.  ?GU cl yellow  ? ? ? ?Assessment & Plan:  ?Principal Problem: ?  Encephalopathy acute ?Active Problems: ?  Anemia ?  Adenocarcinoma, lung, right ([place]) ?  Acute respiratory failure with hypoxia ([place]) ?  Status epilepticus ([place]) ?  Intracerebral hemorrhage ?  History of cervical spine x-ray ?  Protein-calorie malnutrition, severe ? ? 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  ?Plan ?Cont AEDs per neurology ?Decreasing sedation ?RASS goal 0--1  ?Seizure precautions ?Neuro-protective interventions: HOB > 30, euglycemia, avoid fevers ?F/u MRI 2-3 mo ?Stop versed & sedating gtts ? ?Acute hypoxemic respiratory failure ?COPD, on 3L home O2 therapy ?Was intubated for airway protection. No further  seizures LTM stopped.  ?Plan ?PSV as tolerated; awaiting MS to support airway protection ?VAP bundle  ?PRN CXR ?RASS addressed above ? ?Hypertension ?Home regimen includes: Lopressor, Cardura, Lasix. ?Plan ?Goal SBP 130-150 ?Cleviprex as needed.  ?Currently holding home meds as BP w/in goal  ? ? ?Intermittent Fluid and Electrolyte imbalance ?Plan ?Monitor and replace as indicated  ? ?H/o hypothyroidism ?Plan ?Cont synthroid  ? ?H/o lung and breast cancer.. last Onc notes suggest remission ?Plan ?F/u out pt  ? ? ?Best Practice (right click and "Reselect all SmartList Selections" daily)  ? ?Diet/type: tubefeeds ?DVT prophylaxis: SCD ?GI prophylaxis: PPI ?Lines: N/A ?Foley:  Yes, and  it is no longer needed ?Code Status:  full code ?Last date of multidisciplinary goals of care discussion [per primary ] ? ? ? ? ?Critical care time: 32 min   ? ?Erick Colace ACNP-BC ?[place] ?Pager # [phone] OR # [phone] if no answer ? ?

## 2021-11-14 NOTE — Progress Notes (Signed)
Patient's sister Lelan Pons) called with concerns that patient's nephew Yvone Neu was attempting to have patient sign a will during this hospitalization. Lelan Pons states that she is the current POA for the patient and it should remain that way until patient is competent to make a change. I assured Lelan Pons that at this time, no one would allow any changes or allow patient to sign any paperwork due to her condition. Marie requested I document this in patient's chart. ?

## 2021-11-14 NOTE — Progress Notes (Deleted)
Review of pertinent gastrointestinal problems:  1. routine risk for colon cancer. Colonoscopy 2001 was normal. Colonoscopy by Dr. Ardis Hughs 2012, may found a small hyperplastic polyp. Otherwise normal. Recall colonoscopy at 10 year interval.  2. IDA: June 2013 persistently Hemoccult negative, celiac sprue workup was negative by blood test. Her hemoglobin was 8.7, her MCV 64.1. Iron level 16, ferritin 5.  02/2012 given IDA, CT scan and CXR done to check for occult malig; CT suggested soft tissue mass in pancreas however MRI followup 02/2012 cleared the pancreas. CXR was normal. 3. Severe COPD 4. Debility: wheelchair 5.  Change in bowels, minor rectal bleeding evaluated early 2023.  Slightly anemic.  CT scan abdomen pelvis 08/2021 showed no obvious cause.  Quite debilitated, she did not want to undergo colonoscopy.

## 2021-11-15 ENCOUNTER — Inpatient Hospital Stay (HOSPITAL_COMMUNITY): Payer: Medicare Other

## 2021-11-15 DIAGNOSIS — G9340 Encephalopathy, unspecified: Secondary | ICD-10-CM | POA: Diagnosis not present

## 2021-11-15 LAB — GLUCOSE, CAPILLARY
Glucose-Capillary: 123 mg/dL — ABNORMAL HIGH (ref 70–99)
Glucose-Capillary: 137 mg/dL — ABNORMAL HIGH (ref 70–99)
Glucose-Capillary: 139 mg/dL — ABNORMAL HIGH (ref 70–99)
Glucose-Capillary: 160 mg/dL — ABNORMAL HIGH (ref 70–99)
Glucose-Capillary: 176 mg/dL — ABNORMAL HIGH (ref 70–99)
Glucose-Capillary: 188 mg/dL — ABNORMAL HIGH (ref 70–99)
Glucose-Capillary: 196 mg/dL — ABNORMAL HIGH (ref 70–99)

## 2021-11-15 LAB — BASIC METABOLIC PANEL
Anion gap: 4 — ABNORMAL LOW (ref 5–15)
BUN: 11 mg/dL (ref 8–23)
CO2: 32 mmol/L (ref 22–32)
Calcium: 9 mg/dL (ref 8.9–10.3)
Chloride: 104 mmol/L (ref 98–111)
Creatinine, Ser: 0.52 mg/dL (ref 0.44–1.00)
GFR, Estimated: >60 mL/min (ref >60)
Glucose, Bld: 126 mg/dL — ABNORMAL HIGH (ref 70–99)
Potassium: 4.8 mmol/L (ref 3.5–5.1)
Sodium: 140 mmol/L (ref 135–145)

## 2021-11-15 MED ORDER — DEXAMETHASONE SODIUM PHOSPHATE 4 MG/ML IJ SOLN
4.0000 mg | INTRAMUSCULAR | Status: AC
  Administered 2021-11-15 – 2021-11-17 (×3): 4 mg via INTRAVENOUS
  Filled 2021-11-15 (×3): qty 1

## 2021-11-15 MED ORDER — DOXAZOSIN MESYLATE 8 MG PO TABS: 8.0000 mg | ORAL_TABLET | Freq: Every day | ORAL | Status: DC

## 2021-11-15 MED ORDER — METOPROLOL TARTRATE 25 MG/10 ML ORAL SUSPENSION: 12.5000 mg | Freq: Two times a day (BID) | ORAL | Status: DC

## 2021-11-15 MED ORDER — FENTANYL CITRATE PF 50 MCG/ML IJ SOSY
12.5000 ug | PREFILLED_SYRINGE | INTRAMUSCULAR | Status: DC | PRN
  Administered 2021-11-15 – 2021-11-16 (×7): 12.5 ug via INTRAVENOUS
  Filled 2021-11-15 (×7): qty 1

## 2021-11-15 NOTE — Progress Notes (Signed)
PT Cancellation Note ? ?Patient Details ?Name: Kendra Watts ?MRN: [id number] ?DOB: June 10, 1946 ? ? ?Cancelled Treatment:    Reason Eval/Treat Not Completed: Medical issues which prohibited therapy. Pt remains intubated at this time. RN requesting therapies hold. ? ? ?Zenaida Niece ?11/15/2021, 3:18 PM ?

## 2021-11-15 NOTE — Progress Notes (Signed)
OT Cancellation Note ? ?Patient Details ?Name: Kendra Watts ?MRN: [id number] ?DOB: 03/20/1946 ? ? ?Cancelled Treatment:    Reason Eval/Treat Not Completed: Patient not medically ready.  Spoke with primary RN, advised still looking to extubate, and hold until next date.  OT to continue efforts as appropriate.   ? ?Brissa Asante D Sela Falk ?11/15/2021, 2:33 PM ?

## 2021-11-15 NOTE — Progress Notes (Signed)
No plans for extubation today. Pt placed back on full vent support. RN aware. Tracheal aspirate was also collected and sent to lab. ?

## 2021-11-15 NOTE — Progress Notes (Addendum)
? ?NAME:  Kendra Watts, MRN:  [id number], DOB:  Feb 21, 1946, LOS: 3 ?ADMISSION DATE:  11/12/2021 CONSULTATION DATE:  11/12/2021 ?REFERRING MD:  [place] - Neuro CHIEF COMPLAINT:  AMS, seizure activity  ? ?History of Present Illness:  ?76 year old woman who presented to [facility] ED 4/24 via EMS for Code Stroke due to AMS, seizure activity. Traill ~1500 when patient was found down. PMHx significant for HTN, HLD, CHF (Echo 04/2020 LVEF 60-65%,), COPD (on 3L home O2), RAS with nonfunctioning L kidney, breast CA (s/p radiation 2017-2020), lung adenocarcinoma. ? ?Per chart review, patient was found down at home around 1500 by a friend; she was down minutes at most prior to being discovered.  Tonic-clonic seizure-like activity was noted en route with EMS and patient was unresponsive on arrival to ED with ongoing tonic BUE seizure activity.  While in ED, patient was intubated for airway protection and was noted to have posturing/biting ETT. Code Stroke initiated; Neurology was consulted and EEG was initiated with Keppra load, propofol/Versed drips.  Patient was noted to be hypertensive and clevidipine drip was started. CT Head showed small acute parenchymal hemorrhage in the left septal lobe, unlikely causing patient's symptoms.  CTA Head/Neck was grossly unremarkable , C-spine negative for fracture. ? ?PCCM was consulted for ventilator management. ? ?Pertinent Medical History:  ? ?Past Medical History:  ?Diagnosis Date  ? Anemia   ? 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 Breast cancer ([place]) 09/22/2015  ? Malignant  ? Breast cancer of upper-outer quadrant of left female breast ([place]) 09/08/2015  ? Cataract, immature   ? CHF (congestive heart failure) ([place])   ? Acute CHF-06/2018  ? COPD (chronic obstructive pulmonary disease) ([place])   ? Femur fracture, left ([place]) 04/11/2020  ? left femur fracture( greater trochanter  ? Heart murmur   ? states no known problems  ? History of hyperthyroidism   ? History of radiation therapy   ?  bilateral lungs - 10/27/18-11/02/18, Dr. [name]  ? History of radiation therapy 11/28/2015  ? left breast 10/30/2015-11/28/2015   Dr [name]  ? Hyperlipidemia   ? Hypertension   ? states under control with meds., has been on med. x "long time"  ? Hypokalemia   ? from last physical.   ? Hypothyroidism   ? Nonfunctioning kidney   ? left  ? Personal history of radiation therapy   ? 2017  ? Pulmonary nodules   ? Bilateral  ? Radiation 10/30/15-11/28/15  ? left breast 42.72 Gy, boosted to 10 Gy  ? Renal artery stenosis ([place])   ? Tobacco abuse   ? Wears partial dentures   ? upper and lower  ? ?Significant Hospital Events: ?Including procedures, antibiotic start and stop dates in addition to other pertinent events   ?4/24 - Admitted via [facility] ED for AMS, seizure-like activity. CT Head with small L occipital parenchymal hemorrhage, CTA Head/Neck without LVO. Neuro c/s, EEG/AEDs started. ?4/25 no further seizures LTM stopped. MRI Redemonstrated acute/recent hemorrhage in the left occipital lobe, likely similar when comparing across modalities. No masslike enhancement in this region to suggest a metastasis; however, acute blood products limits assessment. ?4/26 weaning sedation. Tolerating PSV but mental status barrier to extubation. Off Cleviprex  ?4/27 weaning. More awake  ?Interim History / Subjective:  ?More awake ? ? ?Objective:  ?Blood pressure 107/73, pulse 100, temperature 98.1 ?F (36.7 ?C), temperature source Axillary, resp. rate (Abnormal) 22, height 5' (1.524 m), weight 39.8 kg, SpO2 99 %. ?   ?  Vent Mode: PSV;CPAP ?FiO2 (%):  [40 %] 40 % ?Set Rate:  [15 bmp] 15 bmp ?Vt Set:  [340 mL] 340 mL ?PEEP:  [5 cmH20] 5 cmH20 ?Pressure Support:  [8 cmH20] 8 cmH20 ?Plateau Pressure:  [9 KMQ28-63 cmH20] 20 cmH20  ? ?Intake/Output Summary (Last 24 hours) at 11/15/2021 0750 ?Last data filed at 11/15/2021 0700 ?Gross per 24 hour  ?Intake 1580.34 ml  ?Output 475 ml  ?Net 1105.34 ml  ? ?Filed Weights  ? 11/12/21 1930 11/14/21 0500  11/15/21 0500  ?Weight: 38.4 kg 39.9 kg 39.8 kg  ? ?Physical Examination: ?General 76 year old female currently on full vent support  ?HENT NCAT no JVD orally intubated ?Pulm clear. No accessory use. VTs 300-400s on PSV 5 ?Card tachy RRR ?Abd soft ?Ext warm and dry  ?Neuro Much more awake. Not following commands but localizes to voice. Opens eyes ?GU cl yellow  ? ? ? ?Assessment & Plan:  ?Principal Problem: ?  Encephalopathy acute ?Active Problems: ?  Anemia ?  Adenocarcinoma, lung, right ([place]) ?  Acute respiratory failure with hypoxia ([place]) ?  Status epilepticus ([place]) ?  Intracerebral hemorrhage ?  History of cervical spine x-ray ?  Protein-calorie malnutrition, severe ? ? 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  ?Plan ?Cont AEDs per neurology ?Sedation stopped.  ?RASS goal 0 ?Seizure precautions ?Neuro-protective measures: HOB > 30, glycemic control, avoid fever  ? ?Acute hypoxemic respiratory failure ?COPD, on 3L home O2 therapy ?Was intubated for airway protection. No further  seizures LTM stopped.  ?Plan ?Cont PSV; mental status improved so hope for extubation in next 24 hrs ?VAP bundle  ?PAD addressed above ?Cont BDs ?DNI/DNR after extubation ? ?Hypertension ?Home regimen includes: Lopressor, Cardura, Lasix. ?Plan ?BP currently less than 130 off medications; initial goal had been to keep 130-150, will continue to hold home meds ? ?Intermittent Fluid and Electrolyte imbalance ?Plan ?Monitor and replace as indicated (will check intermittently)  ? ?H/o hypothyroidism ?Plan ?Synthroid  ? ?H/o lung and breast cancer.. last Onc notes suggest remission ?Plan ?F/u outpt  ? ? ?Best Practice (right click and "Reselect all SmartList Selections" daily)   ? ?Diet/type: tubefeeds ?DVT prophylaxis: SCD ?GI prophylaxis: PPI ?Lines: N/A ?Foley:  Yes, and it is no longer needed ?Code Status:  DNR  ?Last date of multidisciplinary goals of care discussion 4/26] ? ? ? ? ?Critical care time: 31 min  ? ?Erick Colace ACNP-BC ?[place] ?Pager # [phone] OR # [phone] if no answer ? ?

## 2021-11-15 NOTE — Progress Notes (Signed)
SLP Cancellation Note ? ?Patient Details ?Name: Kendra Watts ?MRN: [id number] ?DOB: [birth year] ? ? ?Cancelled treatment:       Reason Eval/Treat Not Completed: Patient not medically ready (Pt intubated at this time, but is weaning. SLP will follow up.) ? ?Ceniya Fowers I. Hardin Negus, [place], CCC-SLP ?Acute Rehabilitation Services ?Office number [phone] ?Pager [phone] ? ?Horton Marshall ?11/15/2021, 10:08 AM ?

## 2021-11-15 NOTE — Progress Notes (Addendum)
STROKE TEAM PROGRESS NOTE  ? ?INTERVAL HISTORY ?Patient has only received PRN fentanyl overnight and is more alert this morning. She opens her eyes spontaneously to noise and grimaces to pain in all four extremities. She withdraws her lower extremities in response to pain.  ?She remains intubated for respiratory failure but plan is to minimize sedation and wean off ventilatory support and extubate as tolerated.  No witnessed seizures ?Vitals:  ? 11/15/21 1000 11/15/21 1100 11/15/21 1124 11/15/21 1200  ?BP: (!) 146/81 115/63    ?Pulse: (!) 110 (!) 102 (!) 117   ?Resp: (!) 28 (!) 27 (!) 30   ?Temp:    99.5 ?F (37.5 ?C)  ?TempSrc:    Oral  ?SpO2: 98% 98% 100%   ?Weight:      ?Height:      ? ?CBC:  ?Recent Labs  ?Lab 11/12/21 ?1610 11/12/21 ?1738 11/12/21 ?2357 11/14/21 ?2353  ?WBC 10.0  --   --  9.4  ?NEUTROABS 6.7  --   --   --   ?HGB 11.5*   < > 10.5* 10.7*  ?HCT 37.1   < > 31.0* 32.5*  ?MCV 97.4  --   --  92.1  ?PLT 201  --   --  162  ? < > = values in this interval not displayed.  ? ?Basic Metabolic Panel:  ?Recent Labs  ?Lab 11/14/21 ?6144 11/14/21 ?1824 11/15/21 ?0604  ?NA 138  --  140  ?K 4.1  --  4.8  ?CL 103  --  104  ?CO2 29  --  32  ?GLUCOSE 121*  --  126*  ?BUN 8  --  11  ?CREATININE 0.63  --  0.52  ?CALCIUM 8.8*  --  9.0  ?MG 2.1 1.9  --   ?PHOS 4.6 4.3  --   ? ?Lipid Panel:  ?Recent Labs  ?Lab 11/12/21 ?1825  ?CHOL 152  ?TRIG 74  ?HDL 79  ?CHOLHDL 1.9  ?VLDL 15  ?[place] 58  ? ?HgbA1c:  ?Recent Labs  ?Lab 11/12/21 ?1610  ?HGBA1C 5.6  ? ?Urine Drug Screen:  ?Recent Labs  ?Lab 11/12/21 ?3154  ?LABOPIA NONE DETECTED  ?COCAINSCRNUR NONE DETECTED  ?LABBENZ POSITIVE*  ?AMPHETMU NONE DETECTED  ?THCU NONE DETECTED  ?LABBARB NONE DETECTED  ?  ?Alcohol Level No results for input(s): ETH in the last 168 hours. ? ?IMAGING past 24 hours ?No results found. ? ?PHYSICAL EXAM ? ?Physical Exam  ?Constitutional: Frail elderly Caucasian lady.  Who is intubated and not sedated ?Psych: NA ?Eyes: No scleral injection ?HENT: No  OP obstrucion ?MSK: no joint deformities.  ?Cardiovascular: Normal rate and regular rhythm.  ?Respiratory: Effort normal, non-labored breathing ?Skin: WDL ? ?Neurological Exam: Patient is intubated . More alert this morning. She opens her eyes spontaneously to noise and grimaces to pain in all four extremities. She withdraws her lower extremities in response to pain.  ? ?ASSESSMENT/PLAN ?Shoshannah Faubert is a 76 year old female with a PMHx of HTN, HLD, CHF, hypothyroidism, non-functional left kidney, breast cancer, lung adenocarcinoma, tobacco abuse, COPD on 3 L home O2. She presented to the emergency department after a fall followed by L tonic-clonic activity witnessed by EMS. She was intubated on arrival to the ED d/t decreased mental status. Keppra loaded and continued on 750 mg BID. CTH remarkable for acute hemorrhage in the L occipital lobe. Repeat CTH with stable hemorrhage size. LTM did not shown any electrographic seizure and was discontinued.  ? ?Patient's subcortical occipital hemorrhage  is in an unusual location for hypertensive ICH possibly hemorrhagic infarct.  No metastasis noted on postcontrast MRI. ?MRI: Left occipital hematoma stable.  Some diffusion changes around it.  No metastasis or other lesions noted. ?CTA Head and Neck: 50% stenosis of the mid right common carotid, 50% stenosis of the distal L common carotid artery ?2D Echo: pending ?LDL 58 ?HgbA1c 5.6 ?VTE prophylaxis - SCDs ?   ?Diet  ? Diet NPO time specified  ? ?aspirin 81 mg daily prior to admission, now on No antithrombotic. ICH. ?Therapy recommendations:  TBD ?Disposition:  TBD ? ?Hyperlipidemia ?Home meds:  rosuvastatin 10 mg daily, resumed in hospital, discontinued given ICH ?LDL 58, goal < 70 ?Continue statin at discharge ? ?Glucose control ?Home meds:  NA ?HgbA1c 5.6, goal < 7.0 ?CBGs ?Recent Labs  ?  11/15/21 ?0417 11/15/21 ?0800 11/15/21 ?1145  ?GLUCAP 160* 139* 137*  ?  ?SSI ? ?Other Stroke Risk Factors ?Advanced Age >/= 24   ?Cigarette smoker, will be advised to stop smoking ?ETOH use, will be advised to drink no more than 1 drink(s) a day ? ? ?CNS ?-Close neuro monitoring ?Wean sedation as tolerated ?Continue seizure medications ? ?RESP ?-vent management per ICU ?-wean when able ? ?CV ?SBP 130-160 ?-Cleviprex, Levophed PRN ? ?Fluid/Electrolyte Disorders ?-Baily BMP ? ?Nutrition ?E46 Protein-Calorie Malnutrition ?-diet consult ? ?Code Status: DNR ? ?Hospital day # 3 ? ? ?Corky Sox, MD ?PGY-1 ? ?I have personally obtained history,examined this patient, reviewed notes, independently viewed imaging studies, participated in medical decision making and plan of care.ROS completed by me personally and pertinent positives fully documented  I have made any additions or clarifications directly to the above note. Agree with note above.  Patient has been weaned off sedation and is slightly more responsive but still not awake enough to be extubated.  She is tolerating weaning well and hopefully can be extubated by tomorrow.  Discussed with critical care team.  No family available at the bedside today for discussion.This patient is critically ill and at significant risk of neurological worsening, death and care requires constant monitoring of vital signs, hemodynamics,respiratory and cardiac monitoring, extensive review of multiple databases, frequent neurological assessment, discussion with family, other specialists and medical decision making of high complexity.I have made any additions or clarifications directly to the above note.This critical care time does not reflect procedure time, or teaching time or supervisory time of PA/NP/Med Resident etc but could involve care discussion time. ? I spent 30 minutes of neurocritical care time  in the care of  this patient. ? ?  ? ? ?Antony Contras, MD ?Medical Director ?[facility] ?Pager: [phone] ?11/15/2021 5:17 PM ? ? ?To contact Stroke Continuity provider, please refer to  [email or web address] ?After hours, contact General Neurology ? ?

## 2021-11-16 ENCOUNTER — Inpatient Hospital Stay (HOSPITAL_COMMUNITY): Payer: Medicare Other

## 2021-11-16 ENCOUNTER — Encounter (HOSPITAL_COMMUNITY): Payer: Self-pay | Admitting: Neurology

## 2021-11-16 DIAGNOSIS — J9621 Acute and chronic respiratory failure with hypoxia: Secondary | ICD-10-CM | POA: Diagnosis not present

## 2021-11-16 DIAGNOSIS — G9340 Encephalopathy, unspecified: Secondary | ICD-10-CM | POA: Diagnosis not present

## 2021-11-16 DIAGNOSIS — R339 Retention of urine, unspecified: Secondary | ICD-10-CM | POA: Insufficient documentation

## 2021-11-16 DIAGNOSIS — J411 Mucopurulent chronic bronchitis: Secondary | ICD-10-CM | POA: Insufficient documentation

## 2021-11-16 DIAGNOSIS — Z66 Do not resuscitate: Secondary | ICD-10-CM

## 2021-11-16 LAB — GLUCOSE, CAPILLARY
Glucose-Capillary: 202 mg/dL — ABNORMAL HIGH (ref 70–99)
Glucose-Capillary: 132 mg/dL — ABNORMAL HIGH (ref 70–99)
Glucose-Capillary: 117 mg/dL — ABNORMAL HIGH (ref 70–99)
Glucose-Capillary: 137 mg/dL — ABNORMAL HIGH (ref 70–99)
Glucose-Capillary: 160 mg/dL — ABNORMAL HIGH (ref 70–99)

## 2021-11-16 LAB — TYPE AND SCREEN
ABO/RH(D): O NEG
Antibody Screen: NEGATIVE
Unit division: 0
Unit division: 0

## 2021-11-16 LAB — BPAM RBC
Blood Product Expiration Date: 202305262359
Blood Product Expiration Date: 202305302359
Unit Type and Rh: 9500
Unit Type and Rh: 9500

## 2021-11-16 MED ORDER — VANCOMYCIN HCL 500 MG/100ML IV SOLN
500.0000 mg | INTRAVENOUS | Status: DC
  Filled 2021-11-16: qty 100

## 2021-11-16 MED ORDER — SODIUM CHLORIDE 0.9 % IV SOLN: 2.0000 g | Freq: Two times a day (BID) | INTRAVENOUS | Status: DC

## 2021-11-16 MED ORDER — SODIUM CHLORIDE 0.9 % IV SOLN
2.0000 g | Freq: Two times a day (BID) | INTRAVENOUS | Status: DC
  Administered 2021-11-16 – 2021-11-17 (×3): 2 g via INTRAVENOUS
  Filled 2021-11-16 (×2): qty 12.5

## 2021-11-16 MED ORDER — VANCOMYCIN HCL 750 MG/150ML IV SOLN
750.0000 mg | Freq: Once | INTRAVENOUS | Status: DC
  Filled 2021-11-16: qty 150

## 2021-11-16 MED ORDER — VANCOMYCIN HCL 750 MG/150ML IV SOLN
750.0000 mg | Freq: Once | INTRAVENOUS | Status: AC
Start: 2021-11-16 — End: 2021-11-16
  Administered 2021-11-16: 750 mg via INTRAVENOUS
  Filled 2021-11-16: qty 150

## 2021-11-16 MED ORDER — ORAL CARE MOUTH RINSE
15.0000 mL | Freq: Two times a day (BID) | OROMUCOSAL | Status: DC
  Administered 2021-11-16 – 2021-11-25 (×17): 15 mL via OROMUCOSAL

## 2021-11-16 NOTE — Procedures (Signed)
Extubation Procedure Note ? ?Patient Details:   ?Name: Kendra Watts ?DOB: 1946/06/28 ?MRN: [id number] ?  ?Airway Documentation:  ?Airway 7 mm (Active)  ?Secured at (cm) 22 cm 11/16/21 0744  ?Measured From Lips 11/16/21 0744  ?Secured Location Left 11/16/21 0744  ?Secured By [employer] 11/16/21 0744  ?Tube Holder Repositioned Yes 11/16/21 0744  ?Prone position No 11/16/21 0744  ?Cuff Pressure (cm H2O) [place] OR 18-26 [facility] 11/16/21 0744  ?Site Condition Dry 11/16/21 0744  ? ?Vent end date: 11/16/21 Vent end time: 0930  ? ?Evaluation ? O2 sats: stable throughout ?Complications: No apparent complications ?Patient did tolerate procedure well. ?Bilateral Breath Sounds: Clear, Diminished ?  ?No ? ?Pt extubated to [place] with RN at bedside. Positive cuff leak noted and pt is tolerating well. RT will continue to monitor. ? ?Cathie Olden ?11/16/2021, 9:39 AM ? ?

## 2021-11-16 NOTE — Progress Notes (Signed)
eLink Physician-Brief Progress Note ?Patient Name: Kendra Watts ?DOB: 1945/12/21 ?MRN: [id number] ? ? ?Date of Service ? 11/16/2021  ?HPI/Events of Note ? Patient with recurrent urinary retention despite I / O catheterization of the bladder x 2 so far.  ?eICU Interventions ? Foley Catheter Protocol ordered.  ? ? ? ?  ? ?[name] [name] ?11/16/2021, 12:12 AM ?

## 2021-11-16 NOTE — Evaluation (Signed)
Clinical/Bedside Swallow Evaluation ?Patient Details  ?Name: Kendra Watts ?MRN: [id number] ?Date of Birth: 17-Nov-1945 ? ?Today's Date: 11/16/2021 ?Time: SLP Start Time (ACUTE ONLY): 1200 SLP Stop Time (ACUTE ONLY): 1213 ?SLP Time Calculation (min) (ACUTE ONLY): 13 min ? ?Past Medical History:  ?Past Medical History:  ?Diagnosis Date  ? Anemia   ? 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 Breast cancer ([place]) 09/22/2015  ? Malignant  ? Breast cancer of upper-outer quadrant of left female breast ([place]) 09/08/2015  ? Cataract, immature   ? CHF (congestive heart failure) ([place])   ? Acute CHF-06/2018  ? COPD (chronic obstructive pulmonary disease) ([place])   ? Femur fracture, left ([place]) 04/11/2020  ? left femur fracture( greater trochanter  ? Heart murmur   ? states no known problems  ? History of hyperthyroidism   ? History of radiation therapy   ? bilateral lungs - 10/27/18-11/02/18, Dr. [name]  ? History of radiation therapy 11/28/2015  ? left breast 10/30/2015-11/28/2015   Dr [name]  ? Hyperlipidemia   ? Hypertension   ? states under control with meds., has been on med. x "long time"  ? Hypokalemia   ? from last physical.   ? Hypothyroidism   ? Nonfunctioning kidney   ? left  ? Personal history of radiation therapy   ? 2017  ? Pulmonary nodules   ? Bilateral  ? Radiation 10/30/15-11/28/15  ? left breast 42.72 Gy, boosted to 10 Gy  ? Renal artery stenosis ([place])   ? Tobacco abuse   ? Wears partial dentures   ? upper and lower  ? ?Past Surgical History:  ?Past Surgical History:  ?Procedure Laterality Date  ? ABDOMINAL ANGIOGRAM  02/18/2012  ? Procedure: ABDOMINAL ANGIOGRAM;  Surgeon: Lorretta Harp, MD;  Location[facility] CATH LAB;  Service: Cardiovascular;;  ? ABDOMINAL AORTAGRAM  07/04/2014  ? ABDOMINAL HYSTERECTOMY  ~ 1977  ? partial  ? APPENDECTOMY    ? ARCH AORTOGRAM    ? BREAST LUMPECTOMY Left 09/22/2015  ? Malignant  ? CAROTID ANGIOGRAM N/A 02/18/2012  ? Procedure: CAROTID ANGIOGRAM;  Surgeon: Lorretta Harp, MD;   Location[facility] CATH LAB;  Service: Cardiovascular;  Laterality: N/A;  ? ENDARTERECTOMY  04/02/2012  ? Procedure: ENDARTERECTOMY CAROTID;  Surgeon: Serafina Mitchell, MD;  Location: [place];  Service: Vascular;  Laterality: Right;  ? FUDUCIAL PLACEMENT Bilateral 09/02/2018  ? Procedure: Placement Of Fiducial to right upper lobe & left upper lobe lung;  Surgeon: [name], MD;  Location: [facility] OR;  Service: Thoracic;  Laterality: Bilateral;  ? IR THORACENTESIS ASP PLEURAL SPACE W/IMG GUIDE  07/08/2018  ? RADIOACTIVE SEED GUIDED PARTIAL MASTECTOMY WITH AXILLARY SENTINEL LYMPH NODE BIOPSY Left 09/22/2015  ? Procedure: INJECT BLUE DYE LEFT BREAST,RADIOACTIVE SEED GUIDED PARTIAL MASTECTOMY WITH AXILLARY SENTINEL LYMPH NODE BIOPSY;  Surgeon: Fanny Skates, MD;  Location: [place];  Service: General;  Laterality: Left;  ? RENAL ANGIOGRAM Left 06/08/2010  ? renal artery stent -  5x12 Genesis on Aviator balloon stent (Dr. Adora Fridge)  ? RENAL ANGIOGRAM Right 07/04/2014  ? Procedure: RENAL ANGIOGRAM;  Surgeon: Lorretta Harp, MD;  Location: [facility] CATH LAB;  Service: Cardiovascular;  Laterality: Right;  ? RENAL ANGIOGRAM Right 08/22/2014  ? Procedure: RENAL ANGIOGRAM;  Surgeon: Lorretta Harp, MD;  Location: [facility] CATH LAB;  Service: Cardiovascular;  Laterality: Right;  ? TONSILLECTOMY    ? as a child  ? VIDEO BRONCHOSCOPY WITH ENDOBRONCHIAL  NAVIGATION N/A 09/02/2018  ? Procedure: VIDEO BRONCHOSCOPY WITH ENDOBRONCHIAL NAVIGATION;  Surgeon: [name], MD;  Location: [facility] OR;  Service: Thoracic;  Laterality: N/A;  ? ?HPI:  ?76 year old woman who presented to [facility] ED 4/24 via EMS for Code Stroke due to AMS, seizure activity; she was down minutes at most prior to being discovered.  Tonic-clonic seizure-like activity was noted en route with EMS.  ETT 4/24-4/28, intubated in ED.  MRI 4/25: "Redemonstrated acute/recent hemorrhage in the left occipital lobe." Infarct felt not to be cause of her symptoms. PMHx significant for  HTN, HLD, CHF (Echo 04/2020 LVEF 60-65%,), COPD (on 3L home O2), RAS with nonfunctioning L kidney, breast CA (s/p radiation 2017-2020), lung adenocarcinoma.  ?  ?Assessment / Plan / Recommendation  ?Clinical Impression ? Pt presents with clinical indicators of pharyngeal dysphagia.  Pt was aphonic on SLP arrival.  She was able to obtain minimal phonation at times.  There was immediate strong, wet cough with thin liquid with expectoration/expulsion of bolus.  Pt tolerated applesauce by spoon and honey thick liquid by cup with no clinical s/s of aspiration.  Pt would benefit from instrumental swallow assessment prior to initiation of PO diet.  Pt agreeable to FEES. ? ?Recommend pt remain NPO pending results of FEES. ? ? ?SLP Visit Diagnosis: Dysphagia, unspecified (R13.10) ?   ?Aspiration Risk ? Moderate aspiration risk  ?  ?Diet Recommendation NPO  ? ?Medication Administration: Via alternative means  ?  ?Other  Recommendations Oral Care Recommendations: Oral care QID   ? ?Recommendations for follow up therapy are one component of a multi-disciplinary discharge planning process, led by the attending physician.  Recommendations may be updated based on patient status, additional functional criteria and insurance authorization. ? ?Follow up Recommendations  (TBD)  ? ? ?  ?Assistance Recommended at Discharge    ?Functional Status Assessment Patient has had a recent decline in their functional status and demonstrates the ability to make significant improvements in function in a reasonable and predictable amount of time.  ?Frequency and Duration  (TBD)  ?  ?  ?   ? ?Prognosis Prognosis for Safe Diet Advancement: Good  ? ?  ? ?Swallow Study   ?General Date of Onset: 11/12/21 ?HPI: 76 year old woman who presented to [facility] ED 4/24 via EMS for Code Stroke due to AMS, seizure activity; she was down minutes at most prior to being discovered.  Tonic-clonic seizure-like activity was noted en route with EMS.  ETT 4/24-4/28, intubated  in ED.  MRI 4/25: "Redemonstrated acute/recent hemorrhage in the left occipital lobe." Infarct felt not to be cause of her symptoms. PMHx significant for HTN, HLD, CHF (Echo 04/2020 LVEF 60-65%,), COPD (on 3L home O2), RAS with nonfunctioning L kidney, breast CA (s/p radiation 2017-2020), lung adenocarcinoma. ?Type of Study: Bedside Swallow Evaluation ?Diet Prior to this Study: NPO ?Temperature Spikes Noted: No ?Respiratory Status: Nasal cannula ?History of Recent Intubation: Yes ?Length of Intubations (days): 4 days ?Date extubated: 11/16/21 ?Behavior/Cognition: Alert;Cooperative;Pleasant mood ?Oral Cavity Assessment: Within Functional Limits ?Oral Care Completed by SLP: No ?Oral Cavity - Dentition: Adequate natural dentition;Missing dentition (Pt wears partials) ?Patient Positioning: Upright in bed ?Baseline Vocal Quality: Aphonic ?Volitional Cough: Weak ?Volitional Swallow: Able to elicit  ?  ?Oral/Motor/Sensory Function Overall Oral Motor/Sensory Function: Within functional limits ?Facial ROM: Within Functional Limits ?Facial Symmetry: Within Functional Limits ?Facial Strength: Within Functional Limits ?Lingual ROM: Reduced left;Reduced right ?Lingual Symmetry: Abnormal symmetry right ?Lingual Strength: Within Functional Limits ?Velum: Within Functional Limits ?  Mandible: Within Functional Limits   ?Ice Chips     ?Thin Liquid Thin Liquid: Impaired ?Pharyngeal  Phase Impairments: Cough - Immediate;Wet Vocal Quality  ?  ?Nectar Thick Nectar Thick Liquid: Not tested   ?Honey Thick Honey Thick Liquid: Within functional limits   ?Puree Puree: Within functional limits   ?Solid ? ? ?  Solid: Not tested  ? ?  ? ?Emerald Gehres E Taneal Sonntag, MA, CCC-SLP ?Acute Rehabilitation Services ?Office: [phone] ?11/16/2021,1:05 PM ? ? ? ?

## 2021-11-16 NOTE — Evaluation (Signed)
Physical Therapy Evaluation ?Patient Details ?Name: Kendra Watts ?MRN: [id number] ?DOB: 10/22/1945 ?Today's Date: 11/16/2021 ? ?History of Present Illness ? Pt is 76 y.o. female who presents to [facility] hospital 11/12/2021 after being found down with seizure like activity and AMS. CT head showed L occipital parenchymal hemorrhage.  Pt intubated on arrival to ED, extubated 4/28. PMH: breast CA, COPD on chronic home 4L O2, CHF, HTN, L hip fx.  ?Clinical Impression ? Pt presents to PT with deficits in strength, power, functional mobility, balance, endurance, gait, cognition, awareness. Pt with impaired awareness of situation and of her deficits, asking why she cannot go home. Pt currently requires physical assistance to perform all functional mobility and appears to fatigue quickly. Pt will benefit from aggressive mobilization in an effort to improve strength and activity tolerance. PT recommends AIR admission at this time.   ?   ? ?Recommendations for follow up therapy are one component of a multi-disciplinary discharge planning process, led by the attending physician.  Recommendations may be updated based on patient status, additional functional criteria and insurance authorization. ? ?Follow Up Recommendations Acute inpatient rehab (3hours/day) ? ?  ?Assistance Recommended at Discharge    ?Patient can return home with the following ? A lot of help with walking and/or transfers;A lot of help with bathing/dressing/bathroom;Assistance with cooking/housework;Direct supervision/assist for medications management;Direct supervision/assist for financial management;Assist for transportation;Help with stairs or ramp for entrance ? ?  ?Equipment Recommendations BSC/3in1  ?Recommendations for Other Services ? Rehab consult  ?  ?Functional Status Assessment Patient has had a recent decline in their functional status and demonstrates the ability to make significant improvements in function in a reasonable and predictable amount of time.   ? ?  ?Precautions / Restrictions Precautions ?Precautions: Fall ?Restrictions ?Weight Bearing Restrictions: No  ? ?  ? ?Mobility ? Bed Mobility ?Overal bed mobility: Needs Assistance ?Bed Mobility: Supine to Sit ?  ?  ?Supine to sit: Mod assist, HOB elevated ?  ?  ?General bed mobility comments: assistance to pivot hips with use of bed pad ?  ? ?Transfers ?Overall transfer level: Needs assistance ?Equipment used: 1 person hand held assist ?Transfers: Sit to/from Stand, Bed to chair/wheelchair/BSC ?Sit to Stand: Min assist ?  ?  ?Squat pivot transfers: Mod assist ?  ?  ?  ?  ? ?Ambulation/Gait ?  ?  ?  ?  ?  ?  ?  ?  ? ?Stairs ?  ?  ?  ?  ?  ? ?Wheelchair Mobility ?  ? ?Modified Rankin (Stroke Patients Only) ?  ? ?  ? ?Balance Overall balance assessment: Needs assistance ?Sitting-balance support: No upper extremity supported, Feet supported ?Sitting balance-Leahy Scale: Poor ?Sitting balance - Comments: minG without UE support ?Postural control: Left lateral lean ?Standing balance support: Bilateral upper extremity supported ?Standing balance-Leahy Scale: Poor ?Standing balance comment: minA for static standing ?  ?  ?  ?  ?  ?  ?  ?  ?  ?  ?  ?   ? ? ? ?Pertinent Vitals/Pain Pain Assessment ?Pain Assessment: Faces ?Faces Pain Scale: No hurt  ? ? ?Home Living Family/patient expects to be discharged to:: Private residence ?Living Arrangements: Alone ?Available Help at Discharge: Friend(s);Available PRN/intermittently ?Type of Home: Apartment ?Home Access: Stairs to enter ?  ?Entrance Stairs-Number of Steps: 15 ?  ?Home Layout: One level ?Home Equipment: Grab bars - tub/shower;Rolling Walker (2 wheels) ?Additional Comments: 3L oxygen at home, history obtained from [place]  ?  ?  Prior Function Prior Level of Function : Independent/Modified Independent ?  ?  ?  ?  ?  ?  ?Mobility Comments: Uses a walker. Doesnt leave the house much ?ADLs Comments: Performs ADLs and simple IADLs. uses pre made meals. Her friend, Nevin Bloodgood,  drives her to the store and does her grocery shopping. ?  ? ? ?Hand Dominance  ? Dominant Hand: Left ? ?  ?Extremity/Trunk Assessment  ? Upper Extremity Assessment ?Upper Extremity Assessment: Defer to OT evaluation ?  ? ?Lower Extremity Assessment ?Lower Extremity Assessment: Generalized weakness (grossly 4-/5 BLE) ?  ? ?Cervical / Trunk Assessment ?Cervical / Trunk Assessment: Kyphotic  ?Communication  ? Communication: HOH (soft spoken)  ?Cognition Arousal/Alertness: Awake/alert ?Behavior During Therapy: [facility] for tasks assessed/performed ?Overall Cognitive Status: Impaired/Different from baseline ?Area of Impairment: Orientation, Memory, Safety/judgement, Awareness, Problem solving ?  ?  ?  ?  ?  ?  ?  ?  ?Orientation Level: Disoriented to, Time, Situation ?  ?Memory: Decreased recall of precautions ?  ?Safety/Judgement: Decreased awareness of safety, Decreased awareness of deficits ?Awareness: Intellectual ?Problem Solving: Slow processing, Difficulty sequencing ?  ?  ?  ? ?  ?General Comments General comments (skin integrity, edema, etc.): pt on 4L [place], VSS. Pt reports wearing 3L [place] at baseline ? ?  ?Exercises    ? ?Assessment/Plan  ?  ?PT Assessment Patient needs continued PT services  ?PT Problem List Decreased strength;Decreased activity tolerance;Decreased balance;Decreased mobility;Decreased cognition;Decreased knowledge of use of DME;Decreased safety awareness;Decreased knowledge of precautions;Cardiopulmonary status limiting activity ? ?   ?  ?PT Treatment Interventions DME instruction;Gait training;Stair training;Functional mobility training;Therapeutic activities;Therapeutic exercise;Balance training;Neuromuscular re-education;Cognitive remediation;Patient/family education   ? ?PT Goals (Current goals can be found in the Care Plan section)  ?Acute Rehab PT Goals ?Patient Stated Goal: to go home ?PT Goal Formulation: With patient ?Time For Goal Achievement: 11/30/21 ?Potential to Achieve Goals: Fair ? ?   ?Frequency Min 3X/week ?  ? ? ?Co-evaluation   ?  ?  ?  ?  ? ? ?  ?AM-PAC PT "6 Clicks" Mobility  ?Outcome Measure Help needed turning from your back to your side while in a flat bed without using bedrails?: A Little ?Help needed moving from lying on your back to sitting on the side of a flat bed without using bedrails?: A Lot ?Help needed moving to and from a bed to a chair (including a wheelchair)?: A Lot ?Help needed standing up from a chair using your arms (e.g., wheelchair or bedside chair)?: A Little ?Help needed to walk in hospital room?: Total ?Help needed climbing 3-5 steps with a railing? : Total ?6 Click Score: 12 ? ?  ?End of Session Equipment Utilized During Treatment: Oxygen ?Activity Tolerance: Patient limited by fatigue ?Patient left: in chair;with call bell/phone within reach;with chair alarm set;with family/visitor present ?Nurse Communication: Mobility status ?PT Visit Diagnosis: Other abnormalities of gait and mobility (R26.89);Muscle weakness (generalized) (M62.81) ?  ? ?Time: 0973-5329 ?PT Time Calculation (min) (ACUTE ONLY): 26 min ? ? ?Charges:   PT Evaluation ?$PT Eval Moderate Complexity: 1 Mod ?  ?  ?   ? ? ?Zenaida Niece, PT, DPT ?Acute Rehabilitation ?Pager: [phone] ?Office [phone] ? ? ?Zenaida Niece ?11/16/2021, 1:44 PM ? ?

## 2021-11-16 NOTE — Plan of Care (Signed)
?  Problem: Education: ?Goal: Knowledge of disease or condition will improve ?Outcome: Progressing ?Goal: Knowledge of secondary prevention will improve (SELECT ALL) ?Outcome: Progressing ?  ?Problem: Health Behavior/Discharge Planning: ?Goal: Ability to manage health-related needs will improve ?Outcome: Progressing ?  ?Problem: Nutrition: ?Goal: Risk of aspiration will decrease ?Outcome: Progressing ?  ?Problem: Intracerebral Hemorrhage Tissue Perfusion: ?Goal: Complications of Intracerebral Hemorrhage will be minimized ?Outcome: Progressing ?  ?

## 2021-11-16 NOTE — Progress Notes (Signed)
SLP Cancellation Note ? ?Patient Details ?Name: Kendra Watts ?MRN: [id number] ?DOB: [birth year] ? ? ?Cancelled treatment:       Reason Eval/Treat Not Completed: Patient not medically ready (still on vent this am). Will f/u as able.  ? ? ? ?Osie Bond., M.A. CCC-SLP ?Acute Rehabilitation Services ?Office [phone] ? ?Secure chat preferred ? ?11/16/2021, 8:30 AM ?

## 2021-11-16 NOTE — Procedures (Signed)
Objective Swallowing Evaluation: Type of Study: FEES-Fiberoptic Endoscopic Evaluation of Swallow ?  ?Patient Details  ?Name: Kendra Watts ?MRN: [id number] ?Date of Birth: 05/04/1946 ? ?Today's Date: 11/16/2021 ?Time: SLP Start Time (ACUTE ONLY): 1218 ?-SLP Stop Time (ACUTE ONLY): 1250 ? ?SLP Time Calculation (min) (ACUTE ONLY): 32 min ? ? ?Past Medical History:  ?Past Medical History:  ?Diagnosis Date  ? Anemia   ? 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 Breast cancer ([place]) 09/22/2015  ? Malignant  ? Breast cancer of upper-outer quadrant of left female breast ([place]) 09/08/2015  ? Cataract, immature   ? CHF (congestive heart failure) ([place])   ? Acute CHF-06/2018  ? COPD (chronic obstructive pulmonary disease) ([place])   ? Femur fracture, left ([place]) 04/11/2020  ? left femur fracture( greater trochanter  ? Heart murmur   ? states no known problems  ? History of hyperthyroidism   ? History of radiation therapy   ? bilateral lungs - 10/27/18-11/02/18, Dr. [name]  ? History of radiation therapy 11/28/2015  ? left breast 10/30/2015-11/28/2015   Dr [name]  ? Hyperlipidemia   ? Hypertension   ? states under control with meds., has been on med. x "long time"  ? Hypokalemia   ? from last physical.   ? Hypothyroidism   ? Nonfunctioning kidney   ? left  ? Personal history of radiation therapy   ? 2017  ? Pulmonary nodules   ? Bilateral  ? Radiation 10/30/15-11/28/15  ? left breast 42.72 Gy, boosted to 10 Gy  ? Renal artery stenosis ([place])   ? Tobacco abuse   ? Wears partial dentures   ? upper and lower  ? ?Past Surgical History:  ?Past Surgical History:  ?Procedure Laterality Date  ? ABDOMINAL ANGIOGRAM  02/18/2012  ? Procedure: ABDOMINAL ANGIOGRAM;  Surgeon: Lorretta Harp, MD;  Location: [facility] CATH LAB;  Service: Cardiovascular;;  ? ABDOMINAL AORTAGRAM  07/04/2014  ? ABDOMINAL HYSTERECTOMY  ~ 1977  ? partial  ? APPENDECTOMY    ? ARCH AORTOGRAM    ? BREAST LUMPECTOMY Left 09/22/2015  ? Malignant  ? CAROTID ANGIOGRAM N/A  02/18/2012  ? Procedure: CAROTID ANGIOGRAM;  Surgeon: Lorretta Harp, MD;  Location[facility] CATH LAB;  Service: Cardiovascular;  Laterality: N/A;  ? ENDARTERECTOMY  04/02/2012  ? Procedure: ENDARTERECTOMY CAROTID;  Surgeon: Serafina Mitchell, MD;  Location: [place];  Service: Vascular;  Laterality: Right;  ? FUDUCIAL PLACEMENT Bilateral 09/02/2018  ? Procedure: Placement Of Fiducial to right upper lobe & left upper lobe lung;  Surgeon: [name], MD;  Location: [facility] OR;  Service: Thoracic;  Laterality: Bilateral;  ? IR THORACENTESIS ASP PLEURAL SPACE W/IMG GUIDE  07/08/2018  ? RADIOACTIVE SEED GUIDED PARTIAL MASTECTOMY WITH AXILLARY SENTINEL LYMPH NODE BIOPSY Left 09/22/2015  ? Procedure: INJECT BLUE DYE LEFT BREAST,RADIOACTIVE SEED GUIDED PARTIAL MASTECTOMY WITH AXILLARY SENTINEL LYMPH NODE BIOPSY;  Surgeon: Fanny Skates, MD;  Location: [place];  Service: General;  Laterality: Left;  ? RENAL ANGIOGRAM Left 06/08/2010  ? renal artery stent -  5x12 Genesis on Aviator balloon stent (Dr. Adora Fridge)  ? RENAL ANGIOGRAM Right 07/04/2014  ? Procedure: RENAL ANGIOGRAM;  Surgeon: Lorretta Harp, MD;  Location[facility] CATH LAB;  Service: Cardiovascular;  Laterality: Right;  ? RENAL ANGIOGRAM Right 08/22/2014  ? Procedure: RENAL ANGIOGRAM;  Surgeon: Lorretta Harp, MD;  Location: [facility] CATH LAB;  Service: Cardiovascular;  Laterality: Right;  ? TONSILLECTOMY    ?  as a child  ? VIDEO BRONCHOSCOPY WITH ENDOBRONCHIAL NAVIGATION N/A 09/02/2018  ? Procedure: VIDEO BRONCHOSCOPY WITH ENDOBRONCHIAL NAVIGATION;  Surgeon: [name], MD;  Location: [facility] OR;  Service: Thoracic;  Laterality: N/A;  ? ?HPI: 76 year old woman who presented to [facility] ED 4/24 via EMS for Code Stroke due to AMS, seizure activity; she was down minutes at most prior to being discovered.  Tonic-clonic seizure-like activity was noted en route with EMS.  ETT 4/24-4/28, intubated in ED.  MRI 4/25: "Redemonstrated acute/recent hemorrhage in the left occipital  lobe." Infarct felt not to be cause of her symptoms. PMHx significant for HTN, HLD, CHF (Echo 04/2020 LVEF 60-65%,), COPD (on 3L home O2), RAS with nonfunctioning L kidney, breast CA (s/p radiation 2017-2020), lung adenocarcinoma. ?  ?No data recorded ? ? Recommendations for follow up therapy are one component of a multi-disciplinary discharge planning process, led by the attending physician.  Recommendations may be updated based on patient status, additional functional criteria and insurance authorization. ? ?Assessment / Plan / Recommendation ? ? ?  11/16/2021  ?  1:41 PM  ?Clinical Impressions  ?Clinical Impression Pt presents with a moderate pharyngeal dysphagia c/b decreased laryngeal closure, which is most likely related to recent, prolonged intubation. There was significant laryngeal edema R>L, especially at arytenoids and aryepiglottic folds.  It was difficult to assess how much vocal fold movement pt was capable of acheiving during today's evaluation.  Deficits resulted in static penetration and possible aspiration of nectar thick liquid by cup.  Pt was sensate to penetration, but spontaneous cough did clear penetrant. There was no penetration of honey thick liquid by cup.  There was no penetration of puree or regular texture solids and very little residue with these consistencies.  There was trace residue with honey thick liquid at interarytenoid space without appreciable flow of bolus into laryngeal vestibule.   ? ?Recommend regular texture diet with honey thick liquid by cup.   ? ?Consider further instrumental assessment of swallowing by MBSS, if indicated.  Pt with fair tolerance of scope passage, but with obvious discomfort and some attempts to remove scope, but was redirected. ?  ?SLP Visit Diagnosis Dysphagia, pharyngeal phase (R13.13)  ?Impact on safety and function Moderate aspiration risk  ? ?   ? ?  11/16/2021  ?  1:41 PM  ?Treatment Recommendations  ?Treatment Recommendations Therapy as outlined  in treatment plan below  ?   ? ?  11/16/2021  ?  1:47 PM  ?Prognosis  ?Prognosis for Safe Diet Advancement Good  ? ? ? ?  11/16/2021  ?  1:41 PM  ?Diet Recommendations  ?SLP Diet Recommendations Regular solids;Honey thick liquids  ?Liquid Administration via [employer];No straw  ?Medication Administration Whole meds with puree  ?Compensations Slow rate;Small sips/bites  ?Postural Changes Seated upright at 90 degrees  ?   ? ? ?  11/16/2021  ?  1:41 PM  ?Other Recommendations  ?Oral Care Recommendations Oral care BID  ?Other Recommendations Order thickener from pharmacy  ?Follow Up Recommendations --  ?Assistance recommended at discharge --  ?Functional Status Assessment Patient has had a recent decline in their functional status and demonstrates the ability to make significant improvements in function in a reasonable and predictable amount of time.  ? ? ? ?  11/16/2021  ?  1:41 PM  ?Frequency and Duration   ?Speech Therapy Frequency (ACUTE ONLY) min 2x/week  ?Treatment Duration 2 weeks  ?   ? ? ?  11/16/2021  ?  1:39 PM  ?Oral Phase  ?Oral Phase WFL  ?  ? ?  11/16/2021  ?  1:40 PM  ?Pharyngeal Phase  ?Pharyngeal- Honey Cup WFL  ?Pharyngeal Material does not enter airway  ?Pharyngeal- Nectar Cup Penetration/Aspiration during swallow;Inter-arytenoid space residue;Reduced airway/laryngeal closure  ?Pharyngeal Material enters airway, remains ABOVE vocal cords and not ejected out  ?Pharyngeal- Puree WFL  ?Pharyngeal Material does not enter airway  ?Pharyngeal- Regular WFL  ?Pharyngeal Material does not enter airway  ?  ? ? ?  11/16/2021  ?  1:40 PM  ?Cervical Esophageal Phase   ?Cervical Esophageal Phase WFL  ? ? ? ?Kendra Watts E Kjersti Dittmer, MA, CCC-SLP ?Acute Rehabilitation Services ?Office: [phone] ?11/16/2021, 1:47 PM ?  ? ?   ?   ?   ?   ?   ? ?  ? ?  ?  ?   ?  ? ? ?  ? ? ?

## 2021-11-16 NOTE — Progress Notes (Addendum)
STROKE TEAM PROGRESS NOTE  ? ?INTERVAL HISTORY ?Patient extubated and is responsive this morning. Follows commands and moves extremities with 4/5 strength. Unable to answer orientation questions.  Voice is hypophonic and difficult for her to speak.  She has mild laryngeal stridor ? ?Vitals:  ? 11/16/21 0900 11/16/21 1000 11/16/21 1100 11/16/21 1200  ?BP: (!) 149/85 (!) 143/89 (!) 142/87 136/77  ?Pulse: 93 (!) 108 (!) 101 (!) 103  ?Resp: (!) 22 (!) 23 (!) 24 18  ?Temp:    99.1 ?F (37.3 ?C)  ?TempSrc:    Oral  ?SpO2: 100% 97% 98% 97%  ?Weight:      ?Height:      ? ?CBC:  ?Recent Labs  ?Lab 11/12/21 ?1610 11/12/21 ?1738 11/12/21 ?2357 11/14/21 ?4315  ?WBC 10.0  --   --  9.4  ?NEUTROABS 6.7  --   --   --   ?HGB 11.5*   < > 10.5* 10.7*  ?HCT 37.1   < > 31.0* 32.5*  ?MCV 97.4  --   --  92.1  ?PLT 201  --   --  162  ? < > = values in this interval not displayed.  ? ?Basic Metabolic Panel:  ?Recent Labs  ?Lab 11/14/21 ?4008 11/14/21 ?1824 11/15/21 ?0604  ?NA 138  --  140  ?K 4.1  --  4.8  ?CL 103  --  104  ?CO2 29  --  32  ?GLUCOSE 121*  --  126*  ?BUN 8  --  11  ?CREATININE 0.63  --  0.52  ?CALCIUM 8.8*  --  9.0  ?MG 2.1 1.9  --   ?PHOS 4.6 4.3  --   ? ?Lipid Panel:  ?Recent Labs  ?Lab 11/12/21 ?1825  ?CHOL 152  ?TRIG 74  ?HDL 79  ?CHOLHDL 1.9  ?VLDL 15  ?[place] 58  ? ?HgbA1c:  ?Recent Labs  ?Lab 11/12/21 ?1610  ?HGBA1C 5.6  ? ?Urine Drug Screen:  ?Recent Labs  ?Lab 11/12/21 ?6761  ?LABOPIA NONE DETECTED  ?COCAINSCRNUR NONE DETECTED  ?LABBENZ POSITIVE*  ?AMPHETMU NONE DETECTED  ?THCU NONE DETECTED  ?LABBARB NONE DETECTED  ?  ?Alcohol Level No results for input(s): ETH in the last 168 hours. ? ?IMAGING past 24 hours ?DG Chest Port 1 View ? ?Result Date: 11/16/2021 ?CLINICAL DATA:  Pneumonia EXAM: PORTABLE CHEST 1 VIEW COMPARISON:  11/15/2021 FINDINGS: Endotracheal tube with the tip 2.7 above the carina. Nasogastric tube coursing below the diaphragm. Lungs are hyperinflated as can be seen with COPD. No focal consolidation.  Band like area of fibrosis in the left upper lobe unchanged from the prior exam. No pleural effusion or pneumothorax. Stable cardiomediastinal silhouette. No acute osseous abnormality. IMPRESSION: 1. No acute cardiopulmonary disease. 2. Support lines and tubing in satisfactory position. Electronically Signed   By: Kathreen Devoid M.D.   On: 11/16/2021 08:35  ? ?DG Chest Port 1 View ? ?Result Date: 11/15/2021 ?CLINICAL DATA:  Shortness of breath. EXAM: PORTABLE CHEST 1 VIEW COMPARISON:  Chest x-ray November 12, 2021. FINDINGS: Endotracheal tube tip projects approximately 2.7 cm above the carina. Clips project over the upper chest bilaterally. Post treatment change and band like opacities in the left upper lobe, similar. Small right pleural effusion. Hyperinflation. Similar cardiomediastinal silhouette. IMPRESSION: 1. Endotracheal tube tip projects 2.7 cm above the carina. 2. Small right pleural effusion. 3. Hyperinflation. 4. Posttreatment changes in the left upper chest, similar to prior. Electronically Signed   By: Margaretha Sheffield M.D.   On:  11/15/2021 15:12   ? ?PHYSICAL EXAM ? ?Physical Exam  ?Constitutional: Frail elderly Caucasian lady. On 4L [place]. ?Psych: affect appropriate ?Eyes: No scleral injection ?HENT: No OP obstrucion ?MSK: no joint deformities.  ?Cardiovascular: Normal rate and regular rhythm.  ?Respiratory: Effort normal, non-labored breathing ?Skin: WDL ? ?Neurological Exam:  ?Mental status: alert, unable to answer orientation questions.  Voice is extremely hypophonic postextubation with mild stridor ?Motor: move extremities to command, 4/5 strength x4 ? ?ASSESSMENT/PLAN ?Kendra Watts is a 76 year old female with a PMHx of HTN, HLD, CHF, hypothyroidism, non-functional left kidney, breast cancer, lung adenocarcinoma, tobacco abuse, COPD on 3 L home O2. She presented to the emergency department after a fall followed by L tonic-clonic activity witnessed by EMS. She was intubated on arrival to the ED d/t  decreased mental status. Keppra loaded and continued on 750 mg BID. CTH remarkable for acute hemorrhage in the L occipital lobe. Repeat CTH with stable hemorrhage size. LTM did not shown any electrographic seizure and was discontinued. Made DNR. Patient extubated 4/28.  ? ?Patient's subcortical occipital hemorrhage is in an unusual location for hypertensive ICH possibly hemorrhagic infarct.  No metastasis noted on postcontrast MRI. ?MRI: Left occipital hematoma stable.  Some diffusion changes around it.  No metastasis or other lesions noted. ?CTA Head and Neck: 50% stenosis of the mid right common carotid, 50% stenosis of the distal L common carotid artery ?2D Echo: pending ?LDL 58 ?HgbA1c 5.6 ?VTE prophylaxis - SCDs ?   ?Diet  ? Diet regular Room service appropriate? Yes; Fluid consistency: Honey Thick  ? ?aspirin 81 mg daily prior to admission, now on No antithrombotic. ICH. ?Will need 30d heart monitor, cardiology is aware and will arrange.  ?Therapy recommendations:  TBD ?Disposition:  TBD ? ?Hyperlipidemia ?Home meds:  rosuvastatin 10 mg daily, resumed in hospital, discontinued given ICH ?LDL 58, goal < 70 ?Continue statin at discharge ? ?Glucose control ?Home meds:  NA ?HgbA1c 5.6, goal < 7.0 ?CBGs ?Recent Labs  ?  11/16/21 ?6073 11/16/21 ?7106 11/16/21 ?1223  ?GLUCAP 202* 132* 117*  ?  ?SSI ? ?Other Stroke Risk Factors ?Advanced Age >/= 51  ?Cigarette smoker, will be advised to stop smoking ?ETOH use, will be advised to drink no more than 1 drink(s) a day ? ? ?CNS ?-Close neuro monitoring ?Continue seizure medications ? ?RESP ?-Extubated, patient is DNR ? ?CV ?SBP 130-160 ? ?Nutrition ?E46 Protein-Calorie Malnutrition ?-diet consult ? ?Code Status: DNR ? ?Hospital day # 4 ? ? ?Corky Sox, MD ?PGY-1 ?I have personally obtained history,examined this patient, reviewed notes, independently viewed imaging studies, participated in medical decision making and plan of care.ROS completed by me personally and  pertinent positives fully documented  I have made any additions or clarifications directly to the above note. Agree with note above.  Patient was extubated today and seems to be doing all right.  Continue mobilize out of bed.  Therapy consults.  Speech therapy for swallow eval.  Will consider transfer to stepdown bed tomorrow if stable.  Discussed with critical care team.  No family available at the bedside.This patient is critically ill and at significant risk of neurological worsening, death and care requires constant monitoring of vital signs, hemodynamics,respiratory and cardiac monitoring, extensive review of multiple databases, frequent neurological assessment, discussion with family, other specialists and medical decision making of high complexity.I have made any additions or clarifications directly to the above note.This critical care time does not reflect procedure time, or teaching time or supervisory  time of PA/NP/Med Resident etc but could involve care discussion time. ? I spent 30 minutes of neurocritical care time  in the care of  this patient. ? ?  ? ? ?Antony Contras, MD ?Medical Director ?[facility] ?Pager: [phone] ?11/16/2021 4:47 PM ? ? ?To contact Stroke Continuity provider, please refer to [email or web address] ?After hours, contact General Neurology ? ?

## 2021-11-16 NOTE — Progress Notes (Signed)
? ?NAME:  Kendra Watts, MRN:  [id number], DOB:  Sep 28, 1945, LOS: 4 ?ADMISSION DATE:  11/12/2021 CONSULTATION DATE:  11/12/2021 ?REFERRING MD:  Kendra Watts - Neuro CHIEF COMPLAINT:  AMS, seizure activity  ? ?History of Present Illness:  ?76 year old woman who presented to [facility] ED 4/24 via EMS for Code Stroke due to AMS, seizure activity. Kendra Watts ~1500 when patient was found down. PMHx significant for HTN, HLD, CHF (Echo 04/2020 LVEF 60-65%,), COPD (on 3L home O2), RAS with nonfunctioning L kidney, breast CA (s/p radiation 2017-2020), lung adenocarcinoma. ? ?Per chart review, patient was found down at home around 1500 by a friend; she was down minutes at most prior to being discovered.  Tonic-clonic seizure-like activity was noted en route with EMS and patient was unresponsive on arrival to ED with ongoing tonic BUE seizure activity.  While in ED, patient was intubated for airway protection and was noted to have posturing/biting ETT. Code Stroke initiated; Neurology was consulted and EEG was initiated with Keppra load, propofol/Versed drips.  Patient was noted to be hypertensive and clevidipine drip was started. CT Head showed small acute parenchymal hemorrhage in the left septal lobe, unlikely causing patient's symptoms.  CTA Head/Neck was grossly unremarkable , C-spine negative for fracture. ? ?PCCM was consulted for ventilator management. ? ?Pertinent Medical History:  ? ?Past Medical History:  ?Diagnosis Date  ? Anemia   ? 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 Breast cancer ([place]) 09/22/2015  ? Malignant  ? Breast cancer of upper-outer quadrant of left female breast ([place]) 09/08/2015  ? Cataract, immature   ? CHF (congestive heart failure) ([place])   ? Acute CHF-06/2018  ? COPD (chronic obstructive pulmonary disease) ([place])   ? Femur fracture, left ([place]) 04/11/2020  ? left femur fracture( greater trochanter  ? Heart murmur   ? states no known problems  ? History of hyperthyroidism   ? History of radiation therapy   ?  bilateral lungs - 10/27/18-11/02/18, Dr. [name]  ? History of radiation therapy 11/28/2015  ? left breast 10/30/2015-11/28/2015   Dr [name]  ? Hyperlipidemia   ? Hypertension   ? states under control with meds., has been on med. x "long time"  ? Hypokalemia   ? from last physical.   ? Hypothyroidism   ? Nonfunctioning kidney   ? left  ? Personal history of radiation therapy   ? 2017  ? Pulmonary nodules   ? Bilateral  ? Radiation 10/30/15-11/28/15  ? left breast 42.72 Gy, boosted to 10 Gy  ? Renal artery stenosis ([place])   ? Tobacco abuse   ? Wears partial dentures   ? upper and lower  ? ?Significant Hospital Events: ?Including procedures, antibiotic start and stop dates in addition to other pertinent events   ?4/24 - Admitted via [facility] ED for AMS, seizure-like activity. CT Head with small L occipital parenchymal hemorrhage, CTA Head/Neck without LVO. Neuro c/s, EEG/AEDs started. ?4/25 no further seizures LTM stopped. MRI Redemonstrated acute/recent hemorrhage in the left occipital lobe, likely similar when comparing across modalities. No masslike enhancement in this region to suggest a metastasis; however, acute blood products limits assessment. ?4/26 weaning sedation. Tolerating PSV but mental status barrier to extubation. Off Cleviprex  ?4/27 weaning. More awake;  ?4/28 still w/ thick sputum vanc and cefepime started. Passed SBT->extubated. Sedation stopped.  ?Interim History / Subjective:  ? ?Passing SBT ? ?Objective:  ?Blood pressure (Abnormal) 141/86, pulse (Abnormal) 117, temperature 98.1 ?F (36.7 ?C), temperature  source Axillary, resp. rate (Abnormal) 29, height 5' (1.524 m), weight 39.2 kg, SpO2 93 %. ?   ?Vent Mode: PSV;CPAP ?FiO2 (%):  [40 %] 40 % ?Set Rate:  [15 bmp] 15 bmp ?Vt Set:  [340 mL] 340 mL ?PEEP:  [5 cmH20] 5 cmH20 ?Pressure Support:  [5 cmH20-8 cmH20] 5 cmH20 ?Plateau Pressure:  [14 cmH20-25 cmH20] 19 cmH20  ? ?Intake/Output Summary (Last 24 hours) at 11/16/2021 0955 ?Last data filed at  11/16/2021 0700 ?Gross per 24 hour  ?Intake 1011.75 ml  ?Output 850 ml  ?Net 161.75 ml  ? ?Filed Weights  ? 11/14/21 0500 11/15/21 0500 11/16/21 0300  ?Weight: 39.9 kg 39.8 kg 39.2 kg  ? ?Physical Examination: ?General frail 76 year old female. Now on PSV ?HENT NCAT orally intubated ?Pulm coarse scattered rhonchi. VTs 300s on SBT no sig accessory use ?Pcxr small right effusion/tubes and lines good ?Card rrr ?Abd soft ?Ext warm and dry brisk CR ?Neuro more awake. Following commands. Generalized weakness ? ?Assessment & Plan:  ?Principal Problem: ?  Encephalopathy acute ?Active Problems: ?  Anemia ?  Adenocarcinoma, lung, right (Kendra Watts) ?  Acute respiratory failure with hypoxia ([place]) ?  Status epilepticus ([place]) ?  Intracerebral hemorrhage ?  History of cervical spine x-ray ?  Protein-calorie malnutrition, severe ?  Purulent bronchitis ([place]) ?  Urinary retention ? ? 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  ?Plan ?Cont AEDs ?Sedation stopped ?RASS goal 0 ?Seizure precautions ?Neuro-protective measures. HOB > 30; glycemic control; avoid fevers ? ?Acute hypoxemic respiratory failure  ?COPD, on 3L home O2 therapy ?Was intubated for airway protection. No further  seizures LTM stopped.  ?Plan ?Extubate today  ?NPO ?Early mobilization  ?Wean oxygen  ?Pulse ox ?IS/Flutter  ?DNR/DNI ? ?Purulent bronchitis vs evolving HCAP ?-cxr not impressive but has purulent sputum  ?Plan ?Culture sent ?Day 1 vanc and cefepime w/ plan to narrow as soon as culture data back  ? ?Hypertension ?Home regimen includes: Lopressor, Cardura, Lasix. ?Plan ?BP goal < 130; maintaining this w/out rx at this point ? ? ?Intermittent Fluid and Electrolyte imbalance ?Plan ?Check  intermittently ? ?H/o hypothyroidism ?Plan ?Cont synthroid  ? ?H/o lung and breast cancer.. last Onc notes suggest remission ?Plan ?F/u outpt  ? ?Urinary retention  ?-has had to be I&O cath'd x 3 ?Plan ?If happens again will replace cath and start doxazosin  ? ?Best Practice (right click and "Reselect all SmartList Selections" daily)  ? ?Diet/type: tubefeeds ?DVT prophylaxis: SCD ?GI prophylaxis: PPI ?Lines: N/A ?Foley:  Yes, and it is no longer needed ?Code Status:  DNR  ?Last date of multidisciplinary goals of care discussion 4/26] ? ? ? ? ?Critical care time: 32 min   ? ?Erick Colace ACNP-BC ?[place] ?Pager # [phone] OR # [phone] if no answer ? ?

## 2021-11-16 NOTE — Progress Notes (Signed)
OT Cancellation Note ? ?Patient Details ?Name: Kendra Watts ?MRN: [id number] ?DOB: 1946-01-22 ? ? ?Cancelled Treatment:    Reason Eval/Treat Not Completed: Other (comment) (plans for possible extubation this am. Will check back later time.) ? ?Elliott Quade,HILLARY ?11/16/2021, 9:10 AM ?Maurie Boettcher, OT/L  ? ?Acute OT Clinical Specialist ?Acute Rehabilitation Services ?Pager [phone] ?Office [phone]  ?

## 2021-11-16 NOTE — Progress Notes (Signed)
Inpatient Rehab Admissions Coordinator Note:  ? ?Per PT recommendations patient was screened for CIR candidacy by Michel Santee, PT. At this time, pt appears to be a potential candidate for CIR. I will place an order for rehab consult for full assessment, per our protocol.  Please contact me any with questions.. ? ?Shann Medal, PT, DPT ?[phone] ?11/16/21 ?2:51 PM  ?

## 2021-11-16 NOTE — Progress Notes (Signed)
Nutrition Follow-up ? ?DOCUMENTATION CODES:  ? ?Severe malnutrition in context of chronic illness, Underweight ? ?INTERVENTION:  ? ?Magic cup TID with meals, each supplement provides 290 kcal and 9 grams of protein  ? ?NUTRITION DIAGNOSIS:  ? ?Severe Malnutrition related to chronic illness (cancer) as evidenced by severe fat depletion, severe muscle depletion. ?Ongoing.  ? ?GOAL:  ? ?Patient will meet greater than or equal to 90% of their needs ?Progressing.  ? ?MONITOR:  ? ?PO intake, Supplement acceptance ? ?REASON FOR ASSESSMENT:  ? ?Consult ?Enteral/tube feeding initiation and management ? ?ASSESSMENT:  ? 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  ? ?Pt discussed during ICU rounds and with RN.  ?Pt with L occipital hematoma likely related to seizures, no mets or other lesions noted in MRI, per MD to be advised to stop drinking and smoking.  ? ?4/28 s/p FEES; SLP cleared for Regular with honey thick liquids  ? ?Medications reviewed and include: decadron, SSI, synthroid, MVI with minerals, protoinx, senokot-s, thiamine  ? ?Labs reviewed:  ?CBG's: 117-202 ?  ? ?Diet Order:   ?Diet Order   ? ?       ?  Diet regular Room service appropriate? Yes with Assist; Fluid consistency: Honey Thick  Diet effective now       ?  ? ?  ?  ? ?  ? ? ?EDUCATION NEEDS:  ? ?Not appropriate for education at this time ? ?Skin:  Skin Assessment: Reviewed RN Assessment ? ?Last BM:  4/28 ? ?Height:  ? ?Ht Readings from Last 1 Encounters:  ?11/12/21 5' (1.524 m)  ? ? ?Weight:  ? ?Wt Readings from Last 1 Encounters:  ?11/16/21 39.2 kg  ? ? ?BMI:  Body mass index is 16.88 kg/m?. ? ?Estimated Nutritional Needs:  ? ?Kcal:  1200-1400 ? ?Protein:  60-75 grams ? ?Fluid:  >1.5 L/day ? ?Lockie Pares., RD, LDN, CNSC ?See AMiON for  contact information  ? ?

## 2021-11-16 NOTE — Evaluation (Signed)
Occupational Therapy Evaluation Patient Details Name: Kendra Watts MRN: [id number] DOB: [birth year] Today's Date: 11/16/2021   History of Present Illness 76 y.o. female who presents to [facility] hospital 11/12/2021 after being found down with seizure like activity and AMS. CT head showed L occipital parenchymal hemorrhage.  Pt intubated on arrival to ED, extubated 4/28. PMH: breast CA, COPD on chronic home 4L O2, CHF, HTN, L hip fx.   Clinical Impression   PTA, pt was living alone and was independent with BADLs and using RW for mobility.  Pt currently requiring Min A for UB ADLs, Mod A for LB ADLs, and Min-Mod A for functional transfer. Pt presenting with decreased cognition, functional use of BUEs, balance, strength, and activity tolerance. Pt would benefit from further acute OT to facilitate safe dc. Recommend dc to AIR for further OT to optimize safety, independence with ADLs, and return to PLOF.      Recommendations for follow up therapy are one component of a multi-disciplinary discharge planning process, led by the attending physician.  Recommendations may be updated based on patient status, additional functional criteria and insurance authorization.   Follow Up Recommendations  Acute inpatient rehab (3hours/day)    Assistance Recommended at Discharge Frequent or constant Supervision/Assistance  Patient can return home with the following A lot of help with walking and/or transfers;A lot of help with bathing/dressing/bathroom    Functional Status Assessment  Patient has had a recent decline in their functional status and demonstrates the ability to make significant improvements in function in a reasonable and predictable amount of time.  Equipment Recommendations  Other (comment) (Defer to next venue)    Recommendations for Other Services Rehab consult;PT consult;Speech consult     Precautions / Restrictions Precautions Precautions: Fall Restrictions Weight Bearing Restrictions: No       Mobility Bed Mobility Overal bed mobility: Needs Assistance Bed Mobility: Supine to Sit     Supine to sit: Mod assist, HOB elevated     General bed mobility comments: assistance to pivot hips with use of bed pad    Transfers Overall transfer level: Needs assistance Equipment used: 1 person hand held assist Transfers: Sit to/from Stand, Bed to chair/wheelchair/BSC Sit to Stand: Min assist   Squat pivot transfers: Mod assist              Balance Overall balance assessment: Needs assistance Sitting-balance support: No upper extremity supported, Feet supported Sitting balance-Leahy Scale: Poor Sitting balance - Comments: minG without UE support Postural control: Left lateral lean Standing balance support: Bilateral upper extremity supported Standing balance-Leahy Scale: Poor Standing balance comment: minA for static standing                           ADL either performed or assessed with clinical judgement   ADL Overall ADL's : Needs assistance/impaired Eating/Feeding: Set up;Supervision/ safety;Sitting   Grooming: Wash/dry face;Set up;Bed level   Upper Body Bathing: Minimal assistance;Sitting   Lower Body Bathing: Moderate assistance;Sit to/from stand   Upper Body Dressing : Minimal assistance;Sitting   Lower Body Dressing: Moderate assistance;Sit to/from stand Lower Body Dressing Details (indicate cue type and reason): Mod A to initating donning of socks and then facilitating reach with BUEs to pull socks over heals Toilet Transfer: Minimal assistance;Moderate assistance;+2 for safety/equipment (simulated to recliner) Toilet Transfer Details (indicate cue type and reason): Min A for power up and Mod A for pivot  General ADL Comments: Pt presenting with decreased balance strength and activity toelrance. Decreased  recall of being intubated     Vision         Perception     Praxis      Pertinent Vitals/Pain Pain  Assessment Pain Assessment: Faces Faces Pain Scale: No hurt Pain Intervention(s): Monitored during session     Hand Dominance Left   Extremity/Trunk Assessment Upper Extremity Assessment Upper Extremity Assessment: Generalized weakness;LUE deficits/detail LUE Deficits / Details: L forearm and elbow swollen and tight. Limited ROM   Lower Extremity Assessment Lower Extremity Assessment: Defer to PT evaluation   Cervical / Trunk Assessment Cervical / Trunk Assessment: Kyphotic   Communication Communication Communication: HOH (soft spoken)   Cognition Arousal/Alertness: Awake/alert Behavior During Therapy: WFL for tasks assessed/performed Overall Cognitive Status: Impaired/Different from baseline Area of Impairment: Orientation, Memory, Safety/judgement, Awareness, Problem solving                 Orientation Level: Disoriented to, Time, Situation   Memory: Decreased recall of precautions   Safety/Judgement: Decreased awareness of safety, Decreased awareness of deficits Awareness: Intellectual Problem Solving: Slow processing, Difficulty sequencing General Comments: Following simple commands with increased cues. Poor recall of situation and not recalling intubation.     General Comments  pt on 4L [place], VSS. Pt reports wearing 3L [place] at baseline. sister present during session    Exercises     Shoulder Instructions      Home Living Family/patient expects to be discharged to:: Private residence Living Arrangements: Alone Available Help at Discharge: Friend(s);Available PRN/intermittently Type of Home: Apartment Home Access: Stairs to enter Entrance Stairs-Number of Steps: 15 Entrance Stairs-Rails: None Home Layout: One level     Bathroom Shower/Tub: Chief Strategy Officer: Standard     Home Equipment: Grab bars - tub/shower;Rolling Environmental consultant (2 wheels)   Additional Comments: 3L oxygen at home, history obtained from Nephew      Prior  Functioning/Environment Prior Level of Function : Independent/Modified Independent             Mobility Comments: Uses a walker. Doesnt leave the house much ADLs Comments: Performs ADLs and simple IADLs. uses pre made meals. Her friend, Gunnar Fusi, drives her to the store and does her grocery shopping.        OT Problem List: Decreased strength;Decreased range of motion;Decreased activity tolerance;Impaired balance (sitting and/or standing);Decreased knowledge of use of DME or AE;Decreased knowledge of precautions      OT Treatment/Interventions: Self-care/ADL training;Therapeutic exercise;Energy conservation;DME and/or AE instruction;Therapeutic activities;Patient/family education    OT Goals(Current goals can be found in the care plan section) Acute Rehab OT Goals Patient Stated Goal: Go home OT Goal Formulation: With patient/family Time For Goal Achievement: 11/30/21 Potential to Achieve Goals: Good  OT Frequency: Min 2X/week    Co-evaluation              AM-PAC OT "6 Clicks" Daily Activity     Outcome Measure Help from another person eating meals?: A Little Help from another person taking care of personal grooming?: A Little Help from another person toileting, which includes using toliet, bedpan, or urinal?: A Lot Help from another person bathing (including washing, rinsing, drying)?: A Lot Help from another person to put on and taking off regular upper body clothing?: A Little Help from another person to put on and taking off regular lower body clothing?: A Lot 6 Click Score: 15   End of Session Equipment Utilized During Treatment:  Oxygen;Gait belt Nurse Communication: Mobility status  Activity Tolerance: Patient tolerated treatment well Patient left: in chair;with call bell/phone within reach;with chair alarm set;with family/visitor present  OT Visit Diagnosis: Unsteadiness on feet (R26.81);Other abnormalities of gait and mobility (R26.89);Muscle weakness  (generalized) (M62.81)                Time: 3235-5732 OT Time Calculation (min): 26 min Charges:  OT General Charges $OT Visit: 1 Visit OT Evaluation $OT Eval Moderate Complexity: 1 Mod  Chinmay Squier MSOT, OTR/L Acute Rehab Pager: [phone] Office: [phone]  Theodoro Grist Adalia Pettis 11/16/2021, 4:40 PM

## 2021-11-16 NOTE — Progress Notes (Signed)
Pharmacy Antibiotic Note ? ?Kendra Watts is a 76 y.o. female admitted on 11/12/2021 with pneumonia.  Pharmacy has been consulted for cefepime/vancomycin dosing. SCr stable at 0.52 (last 4/27). ? ?Plan: ?Cefepime 2g IV q12h ?Vancomycin 750mg  IV x 1; then 500mg  IV q24h. Goal AUC 400-550. ?Expected AUC: 498 ?SCr used: 0.8 ?Monitor clinical progress, c/s, renal function ?F/u de-escalation plan/LOT, vancomycin levels as indicated ? ? ?Height: 5' (152.4 cm) ?Weight: 39.2 kg (86 lb 6.7 oz) ?IBW/kg (Calculated) : 45.5 ? ?Temp (24hrs), Avg:98.9 ?F (37.2 ?C), Min:98.1 ?F (36.7 ?C), Max:99.5 ?F (37.5 ?C) ? ?Recent Labs  ?Lab 11/12/21 ?1610 11/12/21 ?1825 11/13/21 ?0136 11/13/21 ?2012 11/14/21 ?1947 11/15/21 ?0604  ?WBC 10.0  --   --   --  9.4  --   ?CREATININE 0.88  --  0.75 0.68 0.63 0.52  ?LATICACIDVEN 8.2* 2.1*  --  1.4  --   --   ?  ?Estimated Creatinine Clearance: 37.6 mL/min (by C-G formula based on SCr of 0.52 mg/dL).   ? ?No Known Allergies ? ?Arturo Morton, PharmD, BCPS ?Please check AMION for all [facility] contact numbers ?Clinical Pharmacist ?11/16/2021 9:40 AM ?

## 2021-11-16 NOTE — Progress Notes (Signed)
Pt placed on PS/CPAP 5/5 on 40% and is tolerating well. ?

## 2021-11-17 DIAGNOSIS — G9340 Encephalopathy, unspecified: Secondary | ICD-10-CM | POA: Diagnosis not present

## 2021-11-17 DIAGNOSIS — J9621 Acute and chronic respiratory failure with hypoxia: Secondary | ICD-10-CM | POA: Diagnosis not present

## 2021-11-17 DIAGNOSIS — J411 Mucopurulent chronic bronchitis: Secondary | ICD-10-CM | POA: Diagnosis not present

## 2021-11-17 DIAGNOSIS — R569 Unspecified convulsions: Secondary | ICD-10-CM | POA: Diagnosis not present

## 2021-11-17 DIAGNOSIS — I611 Nontraumatic intracerebral hemorrhage in hemisphere, cortical: Secondary | ICD-10-CM | POA: Diagnosis not present

## 2021-11-17 LAB — GLUCOSE, CAPILLARY
Glucose-Capillary: 100 mg/dL — ABNORMAL HIGH (ref 70–99)
Glucose-Capillary: 100 mg/dL — ABNORMAL HIGH (ref 70–99)
Glucose-Capillary: 130 mg/dL — ABNORMAL HIGH (ref 70–99)
Glucose-Capillary: 132 mg/dL — ABNORMAL HIGH (ref 70–99)
Glucose-Capillary: 138 mg/dL — ABNORMAL HIGH (ref 70–99)
Glucose-Capillary: 144 mg/dL — ABNORMAL HIGH (ref 70–99)
Glucose-Capillary: 204 mg/dL — ABNORMAL HIGH (ref 70–99)
Glucose-Capillary: 303 mg/dL — ABNORMAL HIGH (ref 70–99)

## 2021-11-17 LAB — CULTURE, RESPIRATORY W GRAM STAIN: Culture: NORMAL

## 2021-11-17 MED ORDER — LEVOTHYROXINE SODIUM 88 MCG PO TABS
88.0000 ug | ORAL_TABLET | Freq: Every day | ORAL | Status: DC
  Administered 2021-11-17 – 2021-11-25 (×9): 88 ug via ORAL
  Filled 2021-11-17 (×9): qty 1

## 2021-11-17 MED ORDER — METOPROLOL TARTRATE 25 MG PO TABS
25.0000 mg | ORAL_TABLET | Freq: Two times a day (BID) | ORAL | Status: DC
Start: 2021-11-17 — End: 2021-11-25
  Administered 2021-11-17 – 2021-11-25 (×17): 25 mg via ORAL
  Filled 2021-11-17 (×17): qty 1

## 2021-11-17 MED ORDER — METOPROLOL TARTRATE 25 MG PO TABS: 25.0000 mg | ORAL_TABLET | Freq: Two times a day (BID) | ORAL | Status: DC

## 2021-11-17 MED ORDER — MOMETASONE FURO-FORMOTEROL FUM 100-5 MCG/ACT IN AERO
2.0000 | INHALATION_SPRAY | Freq: Two times a day (BID) | RESPIRATORY_TRACT | Status: DC
  Administered 2021-11-17: 2 via RESPIRATORY_TRACT
  Filled 2021-11-17 (×2): qty 8.8

## 2021-11-17 MED ORDER — PANTOPRAZOLE SODIUM 40 MG PO TBEC: 40.0000 mg | DELAYED_RELEASE_TABLET | Freq: Every day | ORAL | Status: DC

## 2021-11-17 MED ORDER — UMECLIDINIUM BROMIDE 62.5 MCG/ACT IN AEPB
1.0000 | INHALATION_SPRAY | Freq: Every day | RESPIRATORY_TRACT | Status: DC
  Filled 2021-11-17 (×2): qty 7

## 2021-11-17 MED ORDER — ADULT MULTIVITAMIN W/MINERALS CH
1.0000 | ORAL_TABLET | Freq: Every day | ORAL | Status: DC
Start: 2021-11-17 — End: 2021-11-25
  Administered 2021-11-17 – 2021-11-25 (×8): 1 via ORAL
  Filled 2021-11-17 (×8): qty 1

## 2021-11-17 MED ORDER — SENNOSIDES-DOCUSATE SODIUM 8.6-50 MG PO TABS
1.0000 | ORAL_TABLET | Freq: Two times a day (BID) | ORAL | Status: DC
  Administered 2021-11-17 – 2021-11-25 (×15): 1 via ORAL
  Filled 2021-11-17 (×15): qty 1

## 2021-11-17 MED ORDER — HEPARIN SODIUM (PORCINE) 5000 UNIT/ML IJ SOLN
5000.0000 [IU] | Freq: Two times a day (BID) | INTRAMUSCULAR | Status: DC
Start: 2021-11-17 — End: 2021-11-25
  Administered 2021-11-17 – 2021-11-25 (×16): 5000 [IU] via SUBCUTANEOUS
  Filled 2021-11-17 (×16): qty 1

## 2021-11-17 MED ORDER — THIAMINE HCL 100 MG PO TABS
100.0000 mg | ORAL_TABLET | Freq: Every day | ORAL | Status: DC
  Administered 2021-11-17 – 2021-11-25 (×9): 100 mg via ORAL
  Filled 2021-11-17 (×9): qty 1

## 2021-11-17 MED ORDER — LEVETIRACETAM 500 MG PO TABS
500.0000 mg | ORAL_TABLET | Freq: Two times a day (BID) | ORAL | Status: DC
  Administered 2021-11-17 – 2021-11-25 (×16): 500 mg via ORAL
  Filled 2021-11-17 (×16): qty 1

## 2021-11-17 NOTE — TOC Initial Note (Signed)
Transition of Care (TOC) - Initial/Assessment Note  ? ? ?Patient Details  ?Name: Kendra Watts ?MRN: [id number] ?Date of Birth: 10/01/1945 ? ?Transition of Care (TOC) CM/SW Contact:    ?Alfredia Ferguson, LCSW ?Phone Number: ?11/17/2021, 3:11 PM ? ?Clinical Narrative:                 ?CSW informed by CIR admissions due to no 24/7 aftercare plan they will be unable to take patient. CSW noted orientation and cognition factors in medical record and spoke with family additionally. Per family they are open to patient going to a SNF but would prefer her to remain local as her friends could visit her. CSW notes they would be willing to consider outside of [facility] depending on ratings. CSW has initiated the SNF referral process.  ? ?Expected Discharge Plan: [place] ?Barriers to Discharge: SNF Pending bed offer ? ? ?Patient Goals and CMS Choice ?  ?CMS Medicare.gov Compare Post Acute Care list provided to:: Patient Represenative (must comment) ?Choice offered to / list presented to : Patient, Sibling ? ?Expected Discharge Plan and Services ?Expected Discharge Plan: [place] ?  ?  ?Post Acute Care Choice: [place] ?Living arrangements for the past 2 months: [place] ?                ?  ?  ?  ?  ?  ?  ?  ?  ?  ?  ? ?Prior Living Arrangements/Services ?Living arrangements for the past 2 months: [place] ?Lives with:: Self ?Patient language and need for interpreter reviewed:: Yes ?       ?Need for Family Participation in Patient Care: Yes (Comment) ?Care giver support system in place?: No (comment) ?  ?Criminal Activity/Legal Involvement Pertinent to Current Situation/Hospitalization: No - Comment as needed ? ?Activities of Daily Living ?  ?  ? ?Permission Sought/Granted ?Permission sought to share information with : Customer service manager ?Permission granted to share information with : Yes, Verbal Permission Granted ? Share Information with NAME:  for SNF referrals ?   ?   ?   ? ?Emotional Assessment ?  ?  ?  ?Orientation: : Fluctuating Orientation (Suspected and/or reported [place]) ?Alcohol / Substance Use: Not Applicable ?Psych Involvement: No (comment) ? ?Admission diagnosis:  Seizure ([place]) [R56.9] ?ICH (intracerebral hemorrhage) ([place]) [I61.9] ?Altered mental status, unspecified altered mental status type [R41.82] ?Patient Active Problem List  ? Diagnosis Date Noted  ? Purulent bronchitis ([place]) 11/16/2021  ? Urinary retention 11/16/2021  ? DNR (do not resuscitate) 11/16/2021  ? Intracerebral hemorrhage 11/13/2021  ? Protein-calorie malnutrition, severe 11/13/2021  ? Status epilepticus ([place])   ? Chronic diastolic heart failure ([place]) 05/23/2020  ? Acute respiratory failure with hypoxia ([place]) 05/23/2020  ? Multiple closed fractures of ribs of left side   ? Fall   ? Palliative care by specialist   ? Goals of care, counseling/discussion   ? Advanced directives, counseling/discussion   ? Generalized weakness   ? Fracture of one rib, left side, initial encounter for closed fracture 04/08/2020  ? Fracture of greater trochanter of left femur ([place]) 04/08/2020  ? Closed fracture of greater trochanter of left femur ([place]) 04/08/2020  ? Epistaxis 03/13/2020  ? Acute blood loss anemia 03/13/2020  ? Current smoker   ? Hypertension, uncontrolled   ? 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 Hemoptysis 09/14/2018  ? 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 Adenocarcinoma,  lung, right ([place]) 09/10/2018  ? Multiple lung nodules 07/20/2018  ? Chronic obstructive pulmonary disease ([place]) 07/20/2018  ? Acute CHF (congestive heart failure) ([place]) 07/06/2018  ? Chronic respiratory failure with hypoxia ([place]) 07/06/2018  ? Hyponatremia 07/06/2018  ? Anemia 07/06/2018  ? Breast cancer of upper-outer quadrant of left female breast ([place]) 09/08/2015  ? Renal artery stenosis ([place]) 08/23/2014  ? Right upper extremity numbness 07/01/2013  ? Atherosclerotic renal artery  stenosis, bilateral ([place]) 05/26/2013  ? Peripheral arterial disease ([place]) 05/26/2013  ? Essential hypertension 05/26/2013  ? Hyperlipidemia 05/26/2013  ? Tobacco abuse 05/26/2013  ? Carotid artery stenosis 03/09/2012  ? ?PCP:  Merrilee Seashore, MD ?Pharmacy:   ?[id number] [place], [place] [address] ?[address] ?[place] [address] ?Phone: [phone] Fax: [phone] ? ? ? ? ?Social Determinants of Health (SDOH) Interventions ?  ? ?Readmission Risk Interventions ? ?  03/16/2020  ?  2:24 PM  ?Readmission Risk Prevention Plan  ?Transportation Screening Complete  ?PCP or Specialist Appt within 3-5 Days Complete  ?[place] or Home Care Consult Complete  ?Social Work Consult for [place] Planning/Counseling Complete  ?Palliative Care Screening Not Applicable  ?Medication Review Press photographer) Complete  ? ? ? ?

## 2021-11-17 NOTE — Progress Notes (Addendum)
STROKE TEAM PROGRESS NOTE  ? ?INTERVAL HISTORY ?Patient is seen in her room with no family at the bedside.  She has been hemodynamically stable and her neurological exam is stable.  She is ready to transfer out of the ICU. ? ?Vitals:  ? 11/17/21 1100 11/17/21 1200 11/17/21 1300 11/17/21 1400  ?BP: (!) 150/103 (!) 135/92 121/69 126/79  ?Pulse: (!) 111     ?Resp: (!) 29 (!) 27 (!) 28 19  ?Temp:  97.8 ?F (36.6 ?C)    ?TempSrc:  Axillary    ?SpO2: 98%     ?Weight:      ?Height:      ? ?CBC:  ?Recent Labs  ?Lab 11/12/21 ?1610 11/12/21 ?1738 11/12/21 ?2357 11/14/21 ?1829  ?WBC 10.0  --   --  9.4  ?NEUTROABS 6.7  --   --   --   ?HGB 11.5*   < > 10.5* 10.7*  ?HCT 37.1   < > 31.0* 32.5*  ?MCV 97.4  --   --  92.1  ?PLT 201  --   --  162  ? < > = values in this interval not displayed.  ? ? ?Basic Metabolic Panel:  ?Recent Labs  ?Lab 11/14/21 ?9371 11/14/21 ?1824 11/15/21 ?0604  ?NA 138  --  140  ?K 4.1  --  4.8  ?CL 103  --  104  ?CO2 29  --  32  ?GLUCOSE 121*  --  126*  ?BUN 8  --  11  ?CREATININE 0.63  --  0.52  ?CALCIUM 8.8*  --  9.0  ?MG 2.1 1.9  --   ?PHOS 4.6 4.3  --   ? ? ?Lipid Panel:  ?Recent Labs  ?Lab 11/12/21 ?1825  ?CHOL 152  ?TRIG 74  ?HDL 79  ?CHOLHDL 1.9  ?VLDL 15  ?[place] 58  ? ? ?HgbA1c:  ?Recent Labs  ?Lab 11/12/21 ?1610  ?HGBA1C 5.6  ? ? ?Urine Drug Screen:  ?Recent Labs  ?Lab 11/12/21 ?6967  ?LABOPIA NONE DETECTED  ?COCAINSCRNUR NONE DETECTED  ?LABBENZ POSITIVE*  ?AMPHETMU NONE DETECTED  ?THCU NONE DETECTED  ?LABBARB NONE DETECTED  ? ?  ?Alcohol Level No results for input(s): ETH in the last 168 hours. ? ?IMAGING past 24 hours ?No results found. ? ?PHYSICAL EXAM ? ?Physical Exam  ?Constitutional: Frail elderly Caucasian lady. On 4L [place]. ?Psych: affect appropriate ?Eyes: No scleral injection ?HENT: No OP obstrucion ?MSK: no joint deformities.  ?Cardiovascular: Normal rate and regular rhythm.  ?Respiratory: Effort normal, non-labored breathing ?Skin: WDL ? ?NEURO:  ?Mental Status: AA&Ox2  ?Speech/Language:  speech is without dysarthria or aphasia.  Naming, repetition, fluency, and comprehension intact. ? ?Cranial Nerves:  ?II: PERRL. Visual fields full.  ?III, IV, VI: EOMI. Eyelids elevate symmetrically.  ?V: Sensation is intact to light touch and symmetrical to face.  ?VII: Smile is symmetrical.   ?VIII: hearing intact to voice. ?IX, X: Phonation is normal.  ?XII: tongue is midline without fasciculations. ?Motor: 5/5 strength to all muscle groups tested.  ?Tone: is normal and bulk is normal ?Sensation- Intact to light touch bilaterally ?Coordination: FTN intact bilaterally, HKS: no ataxia in BLE. Drift in right arm ?Gait- deferred ? ? ? ?ASSESSMENT/PLAN ?Kendra Watts is a 76 year old female with a PMHx of HTN, HLD, CHF, hypothyroidism, non-functional left kidney, breast cancer, lung adenocarcinoma, tobacco abuse, COPD on 3 L home O2. She presented to the emergency department after a fall followed by L tonic-clonic activity witnessed by EMS. She was  intubated on arrival to the ED d/t decreased mental status. Keppra loaded and continued on 750 mg BID. CTH remarkable for acute hemorrhage in the L occipital lobe. Repeat CTH with stable hemorrhage size. LTM did not shown any electrographic seizure and was discontinued. Made DNR. Patient extubated 4/28. She is ready to transfer out of the ICU today ? ?ICH - left occipital ICH, unusual location, concerning for possible hemorrhagic infarct  ?MRI: Left occipital hematoma stable.  Some diffusion changes around it.  No metastasis or other lesions noted. ?CTA Head and Neck: 50% stenosis of the mid right common carotid, 50% stenosis of the distal L common carotid artery ?2D Echo: EF 55 to 60% ?LDL 58 ?HgbA1c 5.6 ?VTE prophylaxis -heparin subcu ?aspirin 81 mg daily prior to admission, now on No antithrombotic due to [place]. ?Will need 30d heart monitor, cardiology is aware and will arrange.  ?Therapy recommendations: CIR ?Disposition:  TBD ? ?Seizure ?Status post Keppra  load ?Long-term EEG negative ?Continue Keppra 750 twice daily ? ?Hypertension ?Home meds: Metoprolol ?BP stable on high and ?Resume home metoprolol 25 twice daily ? ?Hyperlipidemia ?Home meds:  rosuvastatin 10 mg daily ?LDL 58, goal < 70 ?Continue statin at discharge ? ?Tobacco abuse ?Current smoker ?Smoking cessation counseling provided ?Pt is willing to quit ? ?Other Stroke Risk Factors ?Advanced Age >/= 35  ?ETOH use, will be advised to drink no more than 1 drink(s) a day ? ?Other acute issues ?History of lung cancer ?History of breast cancer ?COPD with home oxygen ? ?Code Status: DNR ? ?Hospital day # 5 ? ?[place] , MSN, AGACNP-BC ?[facility] ?See Amion for schedule and pager information ?11/17/2021 2:46 PM ? ?ATTENDING NOTE: ?I reviewed above note and agree with the assessment and plan. Pt was seen and examined.  ? ?76 year old female with history of hypertension, hyperlipidemia, CHF, nonfunctional left kidney, smoker, lung cancer, breast cancer, COPD with home oxygen admitted after a fall at home, followed by seizure and altered mental status, was intubated and loaded with Keppra.  CT head showed left occipital small ICH.  Repeat CT stable ICH.  CTA head and neck 50% bilateral CCA and left ICA stenosis.  Right VA origin high-grade stenosis.  MRI with and without contrast showed no underlying lesion.  Long-term EEG negative for seizure.  EF 55 to 60%, LDL 58, A1c 5.6.  Creatinine 0.52.  Hemoglobin 10.7. ? ?On exam, patient awake, alert, lethargic, eyes open, orientated to age, place, wrong year "67" and month "May". No aphasia, however paucity of speech, moderate dysarthria, following all simple commands. Able to name and repeat simple sentences in dysarthric voice . No gaze palsy, tracking bilaterally, visual field full, PERRL. No facial droop. Tongue midline. LUE 4/5 no drift. RUE drift but not to bed within 10 sec, right hand grip mildly decreased. Bilaterally LEs 3/5 proximally  and 4/5 distally. Sensation symmetrical bilaterally, b/l FTN intact although slow, gait not tested.  ? ?Etiology for patient ICH not quite clear.  Location of ICH concerning for small hemorrhagic transformation, does not consistent with traumatic hematoma.  Slightly high HR and BP, resume home metoprolol 25 twice daily.  Continue Keppra for seizure prevention. ? ?For detailed assessment and plan, please refer to above as I have made changes wherever appropriate.  ? ?Rosalin Hawking, MD PhD ?Stroke Neurology ?11/17/2021 ?8:30 PM ? ?This patient is critically ill due to left occipital ICH, respiratory failure, seizure and at significant risk of neurological worsening, death form hematoma  expansion, cerebral edema, brain herniation, status epilepticus. This patient's care requires constant monitoring of vital signs, hemodynamics, respiratory and cardiac monitoring, review of multiple databases, neurological assessment, discussion with family, other specialists and medical decision making of high complexity. I spent 40 minutes of neurocritical care time in the care of this patient. ? ? ? ?To contact Stroke Continuity provider, please refer to [email or web address] ?After hours, contact General Neurology ? ?

## 2021-11-17 NOTE — Progress Notes (Signed)
? ?NAME:  Kendra Watts, MRN:  [id number], DOB:  1946/01/04, LOS: 5 ?ADMISSION DATE:  11/12/2021 CONSULTATION DATE:  11/12/2021 ?REFERRING MD:  [place]ly [place] - Neuro CHIEF COMPLAINT:  AMS, seizure activity  ? ?History of Present Illness:  ?76 year old woman who presented to [facility] ED 4/24 via EMS for Code Stroke due to AMS, seizure activity. [place] ~1500 when patient was found down. PMHx significant for HTN, HLD, CHF (Echo 04/2020 LVEF 60-65%,), COPD (on 3L home O2), RAS with nonfunctioning L kidney, breast CA (s/p radiation 2017-2020), lung adenocarcinoma. ? ?Per chart review, patient was found down at home around 1500 by a friend; she was down minutes at most prior to being discovered.  Tonic-clonic seizure-like activity was noted en route with EMS and patient was unresponsive on arrival to ED with ongoing tonic BUE seizure activity.  While in ED, patient was intubated for airway protection and was noted to have posturing/biting ETT. Code Stroke initiated; Neurology was consulted and EEG was initiated with Keppra load, propofol/Versed drips.  Patient was noted to be hypertensive and clevidipine drip was started. CT Head showed small acute parenchymal hemorrhage in the left septal lobe, unlikely causing patient's symptoms.  CTA Head/Neck was grossly unremarkable , C-spine negative for fracture. ? ?PCCM was consulted for ventilator management. ? ?Pertinent Medical History:  ? ?Past Medical History:  ?Diagnosis Date  ? Anemia   ? 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 Breast cancer ([place]) 09/22/2015  ? Malignant  ? Breast cancer of upper-outer quadrant of left female breast ([place]) 09/08/2015  ? Cataract, immature   ? CHF (congestive heart failure) ([place])   ? Acute CHF-06/2018  ? COPD (chronic obstructive pulmonary disease) ([place])   ? Encephalopathy acute 11/12/2021  ? Femur fracture, left ([place]) 04/11/2020  ? left femur fracture( greater trochanter  ? Heart murmur   ? states no known problems  ? History of cervical spine x-ray  11/13/2021  ? History of hyperthyroidism   ? History of radiation therapy   ? bilateral lungs - 10/27/18-11/02/18, Dr. [name]  ? History of radiation therapy 11/28/2015  ? left breast 10/30/2015-11/28/2015   Dr [name]  ? Hyperlipidemia   ? Hypertension   ? states under control with meds., has been on med. x "long time"  ? Hypokalemia   ? from last physical.   ? Hypothyroidism   ? Nonfunctioning kidney   ? left  ? Personal history of radiation therapy   ? 2017  ? Pulmonary nodules   ? Bilateral  ? Radiation 10/30/15-11/28/15  ? left breast 42.72 Gy, boosted to 10 Gy  ? Renal artery stenosis ([place])   ? Tobacco abuse   ? Wears partial dentures   ? upper and lower  ? ?Significant Hospital Events: ?Including procedures, antibiotic start and stop dates in addition to other pertinent events   ?4/24 - Admitted via [facility] ED for AMS, seizure-like activity. CT Head with small L occipital parenchymal hemorrhage, CTA Head/Neck without LVO. Neuro c/s, EEG/AEDs started. ?4/25 no further seizures LTM stopped. MRI Redemonstrated acute/recent hemorrhage in the left occipital lobe, likely similar when comparing across modalities. No masslike enhancement in this region to suggest a metastasis; however, acute blood products limits assessment. ?4/26 weaning sedation. Tolerating PSV but mental status barrier to extubation. Off Cleviprex  ?4/27 weaning. More awake;  ?4/28 still w/ thick sputum vanc and cefepime started. Passed SBT->extubated. Sedation stopped.  ?4/29 transfer out of ICU  ?Interim History / Subjective:  ? ?  Extubated yesterday ? ?NAEO  ? ?Objective:  ?Blood pressure (!) 142/94, pulse (!) 107, temperature 98.6 ?F (37 ?C), temperature source Oral, resp. rate (!) 26, height 5' (1.524 m), weight 39.3 kg, SpO2 99 %. ?   ?   ? ?Intake/Output Summary (Last 24 hours) at 11/17/2021 1022 ?Last data filed at 11/17/2021 0800 ?Gross per 24 hour  ?Intake 570.38 ml  ?Output 550 ml  ?Net 20.38 ml  ? ?Filed Weights  ? 11/15/21 0500 11/16/21  0300 11/17/21 0500  ?Weight: 39.8 kg 39.2 kg 39.3 kg  ? ?Physical Examination: ?General: frail elderly F seated in bed NAD  ?Pulm Coarse upper lungs. Even unlabored on 4LNC  ?Card rrr   ?Abd soft ndnt  ?Ext no acute deformity  ?Neuro Awake alert oriented x3 following commands generalized weakness ?Skin: scattered bruising  ? ?Assessment & Plan:  ? ? ?Occipital ICH -- likely hypertensive  ?Status epilepticus, improved ?-postcon mri without mets  ?P ?-cont AEDs  ?-follow neuro exam  ?-PT/OT/SLP  ?-final dose of decadron is 4/30 ? ?Acute metabolic encephalopathy ?P ?-delirium precautions  ? ?Acute on chronic respiratory failure with hypoxia ?Purulent Bronchitis  ?COPD ?Adenocarcinoma of the lung ?Tobacco abuse  ?-home 3LNC ?-extubated 4/29 ?P ?-will dc vanc as MRSA Pcr neg. Cont cefepime ?-pulm hygiene ?-wean O2 as able for goal > 88 ?-IS, mobility  ?-brovana, pulmicort, yupelri, duoneb  ? ?HTN ?P ?-metop  ? ?Hypothyroidism ?P ?-synthroid  ? ?Hyperglycemia ?-steroids, EN ?P ?-sSSI ? ?Severe protein calorie malnutrition ?P ?-RDN ?-supportive care ? ?Hx lung and breast cancer ?-op follow up. Latest notes suggest remission  ? ?Urinary retention  ?P ?-if requiring I/O again, will need foley replaced  ? ?DNR status ? ? ?Dispo: transfer out of ICU. PCCM off 4/30  ? ?Best Practice (right click and "Reselect all SmartList Selections" daily)  ? ?Diet/type: dysphagia diet (see orders) ?DVT prophylaxis: SCD ?GI prophylaxis: N/A ?Lines: N/A ?Foley:  N/A ?Code Status:  DNR  ?Last date of multidisciplinary goals of care discussion 4/26] ? ? ?Cct_ n/a ? ?Eliseo Gum MSN, AGACNP-BC ?[facility] ?Amion for pager  ?11/17/2021, 10:22 AM ? ? ?

## 2021-11-17 NOTE — Progress Notes (Signed)
Inpatient Rehab Admissions Coordinator:  ? ?I spoke with Pt. And nephew regarding potential CIR admit. Pt. Does not have anyone who can provide 24/7 supervision once she discharges from CIR, which she will likely need. Discussed case with rehab MD and he felt that it makes sense for Pt. To d/c directly to SNF as she is likely to need SNF following CIR. I will not pursue for CIR admission.  ? ?Clemens Catholic, MS, CCC-SLP ?Rehab Admissions Coordinator  ?[phone] (celll) ?[phone] (office) ? ?

## 2021-11-17 NOTE — NC FL2 (Signed)
?[place] MEDICAID FL2 LEVEL OF CARE SCREENING TOOL  ?  ? ?IDENTIFICATION  ?Patient Name: ?Kendra Watts Birthdate: Jun 25, 1946 Sex: female Admission Date (Current Location): ?11/12/2021  ?[place] and [place] Number: ? [place] ?  Facility and Address:  ?[facility] [place], [place] [address] [place], [place] [address] ?     Provider Number: ?[id number]  ?Attending Physician Name and Address:  ?Stroke, Md, MD ? Relative Name and Phone Number:  ?[name], sister, [phone] ?   ?Current Level of Care: ?Hospital Recommended Level of Care: ?[place] Prior Approval Number: ?  ? ?Date Approved/Denied: ?  PASRR Number: ?[id number] A ? ?Discharge Plan: ?Home ?  ? ?Current Diagnoses: ?Patient Active Problem List  ? Diagnosis Date Noted  ? Purulent bronchitis ([place]) 11/16/2021  ? Urinary retention 11/16/2021  ? DNR (do not resuscitate) 11/16/2021  ? Intracerebral hemorrhage 11/13/2021  ? Protein-calorie malnutrition, severe 11/13/2021  ? Status epilepticus ([place])   ? Chronic diastolic heart failure ([place]) 05/23/2020  ? Acute respiratory failure with hypoxia ([place]) 05/23/2020  ? Multiple closed fractures of ribs of left side   ? Fall   ? Palliative care by specialist   ? Goals of care, counseling/discussion   ? Advanced directives, counseling/discussion   ? Generalized weakness   ? Fracture of one rib, left side, initial encounter for closed fracture 04/08/2020  ? Fracture of greater trochanter of left femur ([place]) 04/08/2020  ? Closed fracture of greater trochanter of left femur ([place]) 04/08/2020  ? Epistaxis 03/13/2020  ? Acute blood loss anemia 03/13/2020  ? Current smoker   ? Hypertension, uncontrolled   ? 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 Hemoptysis 09/14/2018  ? 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 Adenocarcinoma, lung, right ([place]) 09/10/2018  ? Multiple lung nodules 07/20/2018  ? Chronic obstructive pulmonary disease ([place]) 07/20/2018  ? Acute CHF (congestive heart  failure) ([place]) 07/06/2018  ? Chronic respiratory failure with hypoxia ([place]) 07/06/2018  ? Hyponatremia 07/06/2018  ? Anemia 07/06/2018  ? Breast cancer of upper-outer quadrant of left female breast ([place]) 09/08/2015  ? Renal artery stenosis ([place]) 08/23/2014  ? Right upper extremity numbness 07/01/2013  ? Atherosclerotic renal artery stenosis, bilateral ([place]) 05/26/2013  ? Peripheral arterial disease ([place]) 05/26/2013  ? Essential hypertension 05/26/2013  ? Hyperlipidemia 05/26/2013  ? Tobacco abuse 05/26/2013  ? Carotid artery stenosis 03/09/2012  ? ? ?Orientation RESPIRATION BLADDER Height & Weight   ?  ?Self, Place ? O2 (nasaul cannula) Incontinent Weight: 86 lb 10.3 oz (39.3 kg) ?Height:  5' (152.4 cm)  ?BEHAVIORAL SYMPTOMS/MOOD NEUROLOGICAL BOWEL NUTRITION STATUS  ?  Convulsions/Seizures Incontinent Diet (see discharge summary)  ?AMBULATORY STATUS COMMUNICATION OF NEEDS Skin   ?Extensive Assist Verbally Normal ?  ?  ?  ?    ?     ?     ? ? ?Personal Care Assistance Level of Assistance  ?Bathing, Feeding, Dressing Bathing Assistance: Maximum assistance ?Feeding assistance: Limited assistance ?Dressing Assistance: Maximum assistance ?   ? ?Functional Limitations Info  ?    ?  ?   ? ? ?SPECIAL CARE FACTORS FREQUENCY  ?PT (By licensed PT), OT (By licensed OT)   ?  ?PT Frequency: 5x weekly ?OT Frequency: 5x weekly ?  ?  ?  ?   ? ? ?Contractures    ? ? ?Additional Factors Info  ?Code Status, Allergies Code Status Info: DNR ?Allergies Info: NKDA ?  ?  ?  ?   ? ?  Current Medications (11/17/2021):  This is the current hospital active medication list ?Current Facility-Administered Medications  ?Medication Dose Route Frequency Provider Last Rate Last Admin  ? 0.9 %  sodium chloride infusion  250 mL Intravenous Continuous Frederik Pear, MD   Stopped at 11/16/21 1203  ? acetaminophen (TYLENOL) tablet 650 mg  650 mg Oral Q4H PRN Bhagat, Srishti L, MD      ? Or  ? acetaminophen (TYLENOL) 160 MG/5ML solution 650 mg  650 mg Per  Tube Q4H PRN Bhagat, Srishti L, MD   650 mg at 11/15/21 0002  ? Or  ? acetaminophen (TYLENOL) suppository 650 mg  650 mg Rectal Q4H PRN Bhagat, Srishti L, MD      ? Chlorhexidine Gluconate Cloth 2 % PADS 6 each  6 each Topical E3662 Donnetta Simpers, MD   6 each at 11/16/21 2107  ? dexamethasone (DECADRON) injection 4 mg  4 mg Intravenous Q24H Kipp Brood, MD   4 mg at 11/16/21 1734  ? insulin aspart (novoLOG) injection 0-6 Units  0-6 Units Subcutaneous Q4H Bhagat, Srishti L, MD   1 Units at 11/16/21 2050  ? levETIRAcetam (KEPPRA) tablet 500 mg  500 mg Oral BID Kipp Brood, MD      ? levothyroxine (SYNTHROID) tablet 88 mcg  88 mcg Oral QAC breakfast Garvin Fila, MD   88 mcg at 11/17/21 1028  ? MEDLINE mouth rinse  15 mL Mouth Rinse BID Kipp Brood, MD   15 mL at 11/17/21 1028  ? metoprolol tartrate (LOPRESSOR) tablet 25 mg  25 mg Oral BID Garvin Fila, MD   25 mg at 11/17/21 1028  ? mometasone-formoterol (DULERA) 100-5 MCG/ACT inhaler 2 puff  2 puff Inhalation BID Kipp Brood, MD      ? multivitamin with minerals tablet 1 tablet  1 tablet Oral Daily Garvin Fila, MD   1 tablet at 11/17/21 1028  ? senna-docusate (Senokot-S) tablet 1 tablet  1 tablet Oral BID Garvin Fila, MD      ? thiamine tablet 100 mg  100 mg Oral Daily Antony Contras S, MD   100 mg at 11/17/21 1028  ? umeclidinium bromide (INCRUSE ELLIPTA) 62.5 MCG/ACT 1 puff  1 puff Inhalation Daily Kipp Brood, MD      ? ? ? ?Discharge Medications: ?Please see discharge summary for a list of discharge medications. ? ?Relevant Imaging Results: ? ?Relevant Lab Results: ? ? ?Additional Information ?[phone] ? ?Alfredia Ferguson, LCSW ? ? ? ? ?

## 2021-11-17 NOTE — Progress Notes (Signed)
Physical Therapy Treatment ?Patient Details ?Name: Kendra Watts ?MRN: [id number] ?DOB: 02-28-1946 ?Today's Date: 11/17/2021 ? ? ?History of Present Illness 76 y.o. female who presents to [facility] hospital 11/12/2021 after being found down with seizure like activity and AMS. CT head showed L occipital parenchymal hemorrhage.  Pt intubated on arrival to ED, extubated 4/28. PMH: breast CA, COPD on chronic home 4L O2, CHF, HTN, L hip fx. ? ?  ?PT Comments  ? ? The pt was agreeable to session with focus on progressing OOB transfers and gait. With assist of 2 for safety and to manage balance, the pt was able to ambulate ~15 ft in the room with BUE support and modA to maintain upright. The pt presents with narrow BOS and scissoring steps, multiple LOB with short distance ambulation. VSS on 3L O2. The pt is eager to continue to progress OOB mobility and return to independence so she can return home with her dog Izzy.  ?   ?Recommendations for follow up therapy are one component of a multi-disciplinary discharge planning process, led by the attending physician.  Recommendations may be updated based on patient status, additional functional criteria and insurance authorization. ? ?Follow Up Recommendations ? Acute inpatient rehab (3hours/day) ?  ?  ?Assistance Recommended at Discharge Frequent or constant Supervision/Assistance  ?Patient can return home with the following A lot of help with walking and/or transfers;A lot of help with bathing/dressing/bathroom;Assistance with cooking/housework;Direct supervision/assist for medications management;Direct supervision/assist for financial management;Assist for transportation;Help with stairs or ramp for entrance ?  ?Equipment Recommendations ? BSC/3in1  ?  ?Recommendations for Other Services Rehab consult ? ? ?  ?Precautions / Restrictions Precautions ?Precautions: Fall ?Restrictions ?Weight Bearing Restrictions: No  ?  ? ?Mobility ? Bed Mobility ?Overal bed mobility: Needs  Assistance ?Bed Mobility: Supine to Sit ?  ?  ?Supine to sit: Mod assist, HOB elevated ?  ?  ?General bed mobility comments: pt initiating, but then modA with use of pad to complete movement of hips to EOB, poor sequencing to assist with scooting ?  ? ?Transfers ?Overall transfer level: Needs assistance ?Equipment used: 2 person hand held assist ?Transfers: Sit to/from Stand ?Sit to Stand: Min assist, +2 physical assistance ?  ?  ?  ?  ?  ?General transfer comment: mina of 2 with LLE in hyperextension and pt leaning posteriorly on EOB. ?  ? ?Ambulation/Gait ?Ambulation/Gait assistance: Mod assist, +2 physical assistance ?Gait Distance (Feet): 15 Feet ?Assistive device: 2 person hand held assist ?Gait Pattern/deviations: Step-through pattern, Decreased stride length, Narrow base of support, Leaning posteriorly, Staggering left, Staggering right, Drifts right/left ?Gait velocity: decreased ?  ?  ?General Gait Details: pt with small narrow steps and frequent minor LOB needing moda of 2. chair follow for safety ? ? ? ?  ?Balance Overall balance assessment: Needs assistance ?Sitting-balance support: No upper extremity supported, Feet supported ?Sitting balance-Leahy Scale: Poor ?Sitting balance - Comments: minG without UE support ?Postural control: Left lateral lean ?Standing balance support: Bilateral upper extremity supported ?Standing balance-Leahy Scale: Poor ?Standing balance comment: modA for gait with BUE support ?  ?  ?  ?  ?  ?  ?  ?  ?  ?  ?  ?  ? ?  ?Cognition Arousal/Alertness: Awake/alert ?Behavior During Therapy: [facility] for tasks assessed/performed ?Overall Cognitive Status: Impaired/Different from baseline ?Area of Impairment: Orientation, Memory, Safety/judgement, Awareness, Problem solving ?  ?  ?  ?  ?  ?  ?  ?  ?Orientation  Level: Disoriented to, Place, Time, Situation (states hospital when pressed, but when asked where she was states "the apartments, someone's apartment") ?  ?Memory: Decreased recall of  precautions ?  ?Safety/Judgement: Decreased awareness of safety, Decreased awareness of deficits ?Awareness: Intellectual ?Problem Solving: Slow processing, Difficulty sequencing ?General Comments: following simple commands with cues, increased time ?  ?  ? ?  ?Exercises   ? ?  ?General Comments General comments (skin integrity, edema, etc.): VSS on 3L at rest, to low of 89% on RA ?  ?  ? ?Pertinent Vitals/Pain Pain Assessment ?Pain Assessment: No/denies pain ?Pain Intervention(s): Monitored during session  ? ? ? ?PT Goals (current goals can now be found in the care plan section) Acute Rehab PT Goals ?Patient Stated Goal: to go home ?PT Goal Formulation: With patient ?Time For Goal Achievement: 11/30/21 ?Potential to Achieve Goals: Fair ?Progress towards PT goals: Progressing toward goals ? ?  ?Frequency ? ? ? Min 3X/week ? ? ? ?  ?PT Plan Current plan remains appropriate  ? ? ?   ?AM-PAC PT "6 Clicks" Mobility   ?Outcome Measure ? Help needed turning from your back to your side while in a flat bed without using bedrails?: A Little ?Help needed moving from lying on your back to sitting on the side of a flat bed without using bedrails?: A Lot ?Help needed moving to and from a bed to a chair (including a wheelchair)?: A Lot ?Help needed standing up from a chair using your arms (e.g., wheelchair or bedside chair)?: A Little ?Help needed to walk in hospital room?: Total ?Help needed climbing 3-5 steps with a railing? : Total ?6 Click Score: 12 ? ?  ?End of Session Equipment Utilized During Treatment: Oxygen;Gait belt ?Activity Tolerance: Patient limited by fatigue ?Patient left: in chair;with call bell/phone within reach;with chair alarm set ?Nurse Communication: Mobility status ?PT Visit Diagnosis: Other abnormalities of gait and mobility (R26.89);Muscle weakness (generalized) (M62.81) ?  ? ? ?Time: 5974-1638 ?PT Time Calculation (min) (ACUTE ONLY): 25 min ? ?Charges:  $Gait Training: 8-22 mins ?$Therapeutic Exercise:  8-22 mins          ?          ? ?West Carbo, PT, DPT  ? ?Acute Rehabilitation Department ?Pager #: [phone] - [phone] ? ? ?Sandra Cockayne ?11/17/2021, 3:45 PM ? ?

## 2021-11-18 DIAGNOSIS — I611 Nontraumatic intracerebral hemorrhage in hemisphere, cortical: Secondary | ICD-10-CM | POA: Diagnosis not present

## 2021-11-18 DIAGNOSIS — E43 Unspecified severe protein-calorie malnutrition: Secondary | ICD-10-CM

## 2021-11-18 DIAGNOSIS — J9601 Acute respiratory failure with hypoxia: Secondary | ICD-10-CM | POA: Diagnosis not present

## 2021-11-18 DIAGNOSIS — J441 Chronic obstructive pulmonary disease with (acute) exacerbation: Secondary | ICD-10-CM

## 2021-11-18 DIAGNOSIS — R339 Retention of urine, unspecified: Secondary | ICD-10-CM

## 2021-11-18 DIAGNOSIS — R569 Unspecified convulsions: Secondary | ICD-10-CM | POA: Diagnosis not present

## 2021-11-18 DIAGNOSIS — R4182 Altered mental status, unspecified: Secondary | ICD-10-CM | POA: Diagnosis not present

## 2021-11-18 LAB — GLUCOSE, CAPILLARY
Glucose-Capillary: 107 mg/dL — ABNORMAL HIGH (ref 70–99)
Glucose-Capillary: 108 mg/dL — ABNORMAL HIGH (ref 70–99)
Glucose-Capillary: 162 mg/dL — ABNORMAL HIGH (ref 70–99)
Glucose-Capillary: 221 mg/dL — ABNORMAL HIGH (ref 70–99)
Glucose-Capillary: 254 mg/dL — ABNORMAL HIGH (ref 70–99)
Glucose-Capillary: 56 mg/dL — ABNORMAL LOW (ref 70–99)
Glucose-Capillary: 82 mg/dL (ref 70–99)

## 2021-11-18 LAB — CBC
HCT: 32.7 % — ABNORMAL LOW (ref 36.0–46.0)
Hemoglobin: 10.3 g/dL — ABNORMAL LOW (ref 12.0–15.0)
MCH: 29.9 pg (ref 26.0–34.0)
MCHC: 31.5 g/dL (ref 30.0–36.0)
MCV: 94.8 fL (ref 80.0–100.0)
Platelets: 185 10*3/uL (ref 150–400)
RBC: 3.45 MIL/uL — ABNORMAL LOW (ref 3.87–5.11)
RDW: 15.1 % (ref 11.5–15.5)
WBC: 11.1 10*3/uL — ABNORMAL HIGH (ref 4.0–10.5)
nRBC: 0 % (ref 0.0–0.2)

## 2021-11-18 LAB — BASIC METABOLIC PANEL
Anion gap: 8 (ref 5–15)
BUN: 16 mg/dL (ref 8–23)
CO2: 33 mmol/L — ABNORMAL HIGH (ref 22–32)
Calcium: 9.4 mg/dL (ref 8.9–10.3)
Chloride: 105 mmol/L (ref 98–111)
Creatinine, Ser: 0.57 mg/dL (ref 0.44–1.00)
GFR, Estimated: >60 mL/min (ref >60)
Glucose, Bld: 106 mg/dL — ABNORMAL HIGH (ref 70–99)
Potassium: 4 mmol/L (ref 3.5–5.1)
Sodium: 146 mmol/L — ABNORMAL HIGH (ref 135–145)

## 2021-11-18 MED ORDER — IPRATROPIUM BROMIDE 0.02 % IN SOLN
0.5000 mg | Freq: Three times a day (TID) | RESPIRATORY_TRACT | Status: DC
  Administered 2021-11-19: 0.5 mg via RESPIRATORY_TRACT
  Filled 2021-11-18: qty 2.5

## 2021-11-18 MED ORDER — IPRATROPIUM BROMIDE 0.02 % IN SOLN
RESPIRATORY_TRACT | Status: AC
  Filled 2021-11-18: qty 2.5

## 2021-11-18 MED ORDER — AMLODIPINE BESYLATE 10 MG PO TABS
10.0000 mg | ORAL_TABLET | Freq: Every day | ORAL | Status: DC
  Administered 2021-11-18 – 2021-11-25 (×8): 10 mg via ORAL
  Filled 2021-11-18 (×8): qty 1

## 2021-11-18 MED ORDER — METHYLPREDNISOLONE SODIUM SUCC 40 MG IJ SOLR
40.0000 mg | Freq: Three times a day (TID) | INTRAMUSCULAR | Status: DC
  Administered 2021-11-18 – 2021-11-20 (×6): 40 mg via INTRAVENOUS
  Filled 2021-11-18 (×6): qty 1

## 2021-11-18 MED ORDER — IPRATROPIUM BROMIDE 0.02 % IN SOLN
0.5000 mg | Freq: Three times a day (TID) | RESPIRATORY_TRACT | Status: DC
  Administered 2021-11-18 (×2): 0.5 mg via RESPIRATORY_TRACT
  Filled 2021-11-18 (×2): qty 2.5

## 2021-11-18 MED ORDER — ARFORMOTEROL TARTRATE 15 MCG/2ML IN NEBU
15.0000 ug | INHALATION_SOLUTION | Freq: Two times a day (BID) | RESPIRATORY_TRACT | Status: AC
  Administered 2021-11-18 – 2021-11-20 (×6): 15 ug via RESPIRATORY_TRACT
  Filled 2021-11-18 (×7): qty 2

## 2021-11-18 MED ORDER — BUDESONIDE 0.5 MG/2ML IN SUSP
0.5000 mg | Freq: Two times a day (BID) | RESPIRATORY_TRACT | Status: AC
  Administered 2021-11-18 – 2021-11-20 (×6): 0.5 mg via RESPIRATORY_TRACT
  Filled 2021-11-18 (×5): qty 2

## 2021-11-18 MED ORDER — ROSUVASTATIN CALCIUM 5 MG PO TABS
10.0000 mg | ORAL_TABLET | Freq: Every day | ORAL | Status: DC
Start: 2021-11-18 — End: 2021-11-25
  Administered 2021-11-18 – 2021-11-24 (×7): 10 mg via ORAL
  Filled 2021-11-18 (×7): qty 2

## 2021-11-18 MED ORDER — BUDESONIDE 0.25 MG/2ML IN SUSP
RESPIRATORY_TRACT | Status: AC
  Administered 2021-11-18: 0.5 mg
  Filled 2021-11-18: qty 2

## 2021-11-18 MED ORDER — GUAIFENESIN ER 600 MG PO TB12
600.0000 mg | ORAL_TABLET | Freq: Two times a day (BID) | ORAL | Status: DC
Start: 2021-11-18 — End: 2021-11-25
  Administered 2021-11-18 – 2021-11-25 (×15): 600 mg via ORAL
  Filled 2021-11-18 (×15): qty 1

## 2021-11-18 MED ORDER — BUDESONIDE 0.5 MG/2ML IN SUSP
RESPIRATORY_TRACT | Status: AC
  Filled 2021-11-18: qty 2

## 2021-11-18 MED ORDER — LEVALBUTEROL HCL 0.63 MG/3ML IN NEBU
0.6300 mg | INHALATION_SOLUTION | Freq: Three times a day (TID) | RESPIRATORY_TRACT | Status: DC
  Administered 2021-11-18 (×2): 0.63 mg via RESPIRATORY_TRACT
  Filled 2021-11-18 (×2): qty 3

## 2021-11-18 MED ORDER — LEVALBUTEROL HCL 0.63 MG/3ML IN NEBU
0.6300 mg | INHALATION_SOLUTION | Freq: Three times a day (TID) | RESPIRATORY_TRACT | Status: DC
  Administered 2021-11-19: 0.63 mg via RESPIRATORY_TRACT
  Filled 2021-11-18: qty 3

## 2021-11-18 MED ORDER — SODIUM CHLORIDE 0.45 % IV SOLN: INTRAVENOUS | Status: AC

## 2021-11-18 NOTE — Evaluation (Signed)
Speech Language Pathology Evaluation Patient Details Name: Kendra Watts MRN: [id number] DOB: 1945-12-15 Today's Date: 11/18/2021 Time: 2952-8413 SLP Time Calculation (min) (ACUTE ONLY): 33 min  Problem List:  Patient Active Problem List   Diagnosis Date Noted   Purulent bronchitis (HCC) 11/16/2021   Urinary retention 11/16/2021   DNR (do not resuscitate) 11/16/2021   Intracerebral hemorrhage 11/13/2021   Protein-calorie malnutrition, severe 11/13/2021   Status epilepticus (HCC)    Chronic diastolic heart failure (HCC) 05/23/2020   COPD with acute exacerbation (HCC) 05/23/2020   Acute respiratory failure with hypoxia (HCC) 05/23/2020   Multiple closed fractures of ribs of left side    Fall    Palliative care by specialist    Goals of care, counseling/discussion    Advanced directives, counseling/discussion    Generalized weakness    Fracture of one rib, left side, initial encounter for closed fracture 04/08/2020   Fracture of greater trochanter of left femur (HCC) 04/08/2020   Closed fracture of greater trochanter of left femur (HCC) 04/08/2020   Epistaxis 03/13/2020   Acute blood loss anemia 03/13/2020   Current smoker    Hypertension, uncontrolled    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)    Hemoptysis 09/14/2018   Squamous cell lung cancer, left (HCC) 09/10/2018   Adenocarcinoma, lung, 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   Multiple lung nodules 07/20/2018   Chronic obstructive pulmonary disease (HCC) 07/20/2018   Acute CHF (congestive heart failure) (HCC) 07/06/2018   Chronic respiratory failure with hypoxia (HCC) 07/06/2018   Hyponatremia 07/06/2018   Anemia 07/06/2018   Breast cancer of upper-outer quadrant of left female breast (HCC) 09/08/2015   Renal artery stenosis (HCC) 08/23/2014   Right upper extremity numbness 07/01/2013   Atherosclerotic renal artery stenosis, bilateral (HCC) 05/26/2013   Peripheral arterial disease (HCC) 05/26/2013   Essential hypertension  05/26/2013   Hyperlipidemia 05/26/2013   Tobacco abuse 05/26/2013   Carotid artery stenosis 03/09/2012   Past Medical History:  Past Medical History:  Diagnosis Date   Anemia    Aortic arch anomaly    arteria lusoria    Breast cancer (HCC) 09/22/2015   Malignant   Breast cancer of upper-outer quadrant of left female breast (HCC) 09/08/2015   Cataract, immature    CHF (congestive heart failure) (HCC)    Acute CHF-06/2018   COPD (chronic obstructive pulmonary disease) (HCC)    Encephalopathy acute 11/12/2021   Femur fracture, left (HCC) 04/11/2020   left femur fracture( greater trochanter   Heart murmur    states no known problems   History of cervical spine x-ray 11/13/2021   History of hyperthyroidism    History of radiation therapy    bilateral lungs - 10/27/18-11/02/18, Dr. Antony Blackbird   History of radiation therapy 11/28/2015   left breast 10/30/2015-11/28/2015   Dr Antony Blackbird   Hyperlipidemia    Hypertension    states under control with meds., has been on med. x "long time"   Hypokalemia    from last physical.    Hypothyroidism    Nonfunctioning kidney    left   Personal history of radiation therapy    2017   Pulmonary nodules    Bilateral   Radiation 10/30/15-11/28/15   left breast 42.72 Gy, boosted to 10 Gy   Renal artery stenosis (HCC)    Tobacco abuse    Wears partial dentures    upper and lower   Past Surgical History:  Past Surgical History:  Procedure Laterality Date   ABDOMINAL ANGIOGRAM  02/18/2012   Procedure: ABDOMINAL ANGIOGRAM;  Surgeon: Runell Gess, MD;  [facility] CATH LAB;  Service: Cardiovascular;;   ABDOMINAL AORTAGRAM  07/04/2014   ABDOMINAL HYSTERECTOMY  ~ 1977   partial   APPENDECTOMY     ARCH AORTOGRAM     BREAST LUMPECTOMY Left 09/22/2015   Malignant   CAROTID ANGIOGRAM N/A 02/18/2012   Procedure: CAROTID ANGIOGRAM;  Surgeon: Runell Gess, MD;  Location: [facility] CATH LAB;  Service: Cardiovascular;  Laterality: N/A;    ENDARTERECTOMY  04/02/2012   Procedure: ENDARTERECTOMY CAROTID;  Surgeon: Nada Libman, MD;  Location: [facility] OR;  Service: Vascular;  Laterality: Right;   FUDUCIAL PLACEMENT Bilateral 09/02/2018   Procedure: Placement Of Fiducial to right upper lobe & left upper lobe lung;  Surgeon: Leslye Peer, MD;  Location: [facility] OR;  Service: Thoracic;  Laterality: Bilateral;   IR THORACENTESIS ASP PLEURAL SPACE W/IMG GUIDE  07/08/2018   RADIOACTIVE SEED GUIDED PARTIAL MASTECTOMY WITH AXILLARY SENTINEL LYMPH NODE BIOPSY Left 09/22/2015   Procedure: INJECT BLUE DYE LEFT BREAST,RADIOACTIVE SEED GUIDED PARTIAL MASTECTOMY WITH AXILLARY SENTINEL LYMPH NODE BIOPSY;  Surgeon: Claud Kelp, MD;  Location: [facility];  Service: General;  Laterality: Left;   RENAL ANGIOGRAM Left 06/08/2010   renal artery stent -  5x12 Genesis on Aviator balloon stent (Dr. Erlene Quan)   RENAL ANGIOGRAM Right 07/04/2014   Procedure: RENAL ANGIOGRAM;  Surgeon: Runell Gess, MD;  Location: [facility] CATH LAB;  Service: Cardiovascular;  Laterality: Right;   RENAL ANGIOGRAM Right 08/22/2014   Procedure: RENAL ANGIOGRAM;  Surgeon: Runell Gess, MD;  Location: [facility] CATH LAB;  Service: Cardiovascular;  Laterality: Right;   TONSILLECTOMY     as a child   VIDEO BRONCHOSCOPY WITH ENDOBRONCHIAL NAVIGATION N/A 09/02/2018   Procedure: VIDEO BRONCHOSCOPY WITH ENDOBRONCHIAL NAVIGATION;  Surgeon: Leslye Peer, MD;  Location: [facility] OR;  Service: Thoracic;  Laterality: N/A;   HPI:  76 year old woman who presented to [facility] ED 4/24 via EMS for Code Stroke due to AMS, seizure activity; she was down minutes at most prior to being discovered.  Tonic-clonic seizure-like activity was noted en route with EMS.  ETT 4/24-4/28, intubated in ED.  MRI 4/25: "Redemonstrated acute/recent hemorrhage in the left occipital lobe." Infarct felt not to be cause of her symptoms. PMHx significant for HTN, HLD, CHF (Echo 04/2020 LVEF 60-65%,), COPD (on 3L home O2), RAS with  nonfunctioning L kidney, breast CA (s/p radiation 2017-2020), lung adenocarcinoma.   Assessment / Plan / Recommendation Clinical Impression  Pt participated in speech-language-cognition evaluation. Pt stated that she lived alone prior to admission, was independent with her medications, and finances and did not have any difficulty with cognition or communication. Pt initially denied any acute changes in the aforementioned areas, but subsequently stated that she "didn't do too bad", but that her thinking may be a bit slow, and that she is "having a little issue". The [facility] Mental Status Examination was completed to evaluate the pt's cognitive-linguistic skills. Her score was adjusted since pt stated that she could not see the shapes. She achieved an adjusted score of 5/28 which is below the normal limits of 27 or more out of 30. She exhibited difficulty in the areas of awareness, attention, memory, problem solving, orientation, and executive function.  Pt's processing speed was notably slow and multiple repetitions were often needed. Skilled SLP services are clinically indicated at this time to improve cognitive-linguistic function.    SLP Assessment  SLP Recommendation/Assessment: Patient needs continued Speech Lanaguage Pathology Services SLP Visit Diagnosis: Cognitive communication deficit (R41.841)    Recommendations for follow up therapy are one component of a multi-disciplinary discharge planning process, led by the attending physician.  Recommendations may be updated based on patient status, additional functional criteria and insurance authorization.    Follow Up Recommendations  Acute inpatient rehab (3hours/day)    Assistance Recommended at Discharge  Frequent or constant Supervision/Assistance  Functional Status Assessment Patient has had a recent decline in their functional status and demonstrates the ability to make significant improvements in function in a reasonable and  predictable amount of time.  Frequency and Duration min 2x/week  2 weeks      SLP Evaluation Cognition  Overall Cognitive Status: Impaired/Different from baseline Arousal/Alertness: Awake/alert Orientation Level: Oriented to person;Oriented to place;Oriented to situation;Disoriented to time Year:  (1924) Month: April Day of Week: Incorrect Attention: Focused;Sustained Focused Attention: Impaired Focused Attention Impairment: Verbal complex Sustained Attention: Impaired Sustained Attention Impairment: Verbal basic Memory: Impaired Memory Impairment: Retrieval deficit;Decreased recall of new information;Decreased short term memory Awareness: Impaired Awareness Impairment: Emergent impairment Problem Solving: Impaired Problem Solving Impairment: Verbal complex (Money: 0/3) Executive Function: Sequencing;Organizing Sequencing: Impaired Sequencing Impairment:  (clock drawing: 0/4) Organizing: Impaired Organizing Impairment:  (backward digit span: 0/2)       Comprehension  Auditory Comprehension Overall Auditory Comprehension: Appears within functional limits for tasks assessed Yes/No Questions: Impaired Basic Immediate Environment Questions:  (4/4) Commands: Impaired Interfering Components: Motor planning;Processing speed;Working [employer] fast streamer Reading Comprehension Reading Status: Within funtional limits    Expression Expression Primary Mode of Expression: Verbal Verbal Expression Overall Verbal Expression: Appears within functional limits for tasks assessed Initiation: No impairment Level of Generative/Spontaneous Verbalization: Conversation Repetition: No impairment Pragmatics: No impairment Written Expression Dominant Hand: Left   Oral / Motor  Oral Motor/Sensory Function Facial ROM: Within Functional Limits Facial Symmetry: Within Functional Limits Facial Strength: Within Functional Limits Lingual Strength: Within Functional Limits Velum: Within Functional  Limits Mandible: Within Functional Limits Motor Speech Overall Motor Speech: Appears within functional limits for tasks assessed Respiration: Within functional limits Phonation: Normal           Dominyk Law I. Vear Clock, MS, CCC-SLP Acute Rehabilitation Services Office number [phone] Pager [phone]  Scheryl Marten 11/18/2021, 1:12 PM

## 2021-11-18 NOTE — Progress Notes (Signed)
Speech Language Pathology Treatment: Dysphagia  ?Patient Details ?Name: Kendra Watts ?MRN: [id number] ?DOB: 1945-12-06 ?Today's Date: 11/18/2021 ?Time: 2202-5427 ?SLP Time Calculation (min) (ACUTE ONLY): 15 min ? ?Assessment / Plan / Recommendation ?Clinical Impression ? Pt was seen during lunch for dysphagia treatment. She consumed a meal of roast beef, baked potato, green beans, and honey thick liquids via cup. Pt tolerated these solids and liquids without overt s/sx of aspiration. Mastication was mildly prolonged, but functional and oral clearance adequate. Pt demonstrated inconsistent throat clearing with trials of nectar thick liquids via cup. It is recommended that the pt's current diet of regular texture solids and honey thick liquids be continued with observance of swallowing precautions. SLP will continue to follow pt.   ?  ?HPI HPI: 76 year old woman who presented to [facility] ED 4/24 via EMS for Code Stroke due to AMS, seizure activity; she was down minutes at most prior to being discovered.  Tonic-clonic seizure-like activity was noted en route with EMS.  ETT 4/24-4/28, intubated in ED.  MRI 4/25: "Redemonstrated acute/recent hemorrhage in the left occipital lobe." Infarct felt not to be cause of her symptoms. PMHx significant for HTN, HLD, CHF (Echo 04/2020 LVEF 60-65%,), COPD (on 3L home O2), RAS with nonfunctioning L kidney, breast CA (s/p radiation 2017-2020), lung adenocarcinoma. ?  ?   ?SLP Plan ? Continue with current plan of care ? ?Patient needs continued Speech Lanaguage Pathology Services ?  ?Recommendations for follow up therapy are one component of a multi-disciplinary discharge planning process, led by the attending physician.  Recommendations may be updated based on patient status, additional functional criteria and insurance authorization. ?  ? ?Recommendations  ?Diet recommendations: Regular;Honey-thick liquid ?Liquids provided via: Cup;No straw ?Medication Administration: Whole meds with  puree ?Supervision: Staff to assist with self feeding;Full supervision/cueing for compensatory strategies ?Compensations: Slow rate;Small sips/bites ?Postural Changes and/or Swallow Maneuvers: Seated upright 90 degrees  ?   ?    ?   ? ? ? ? Follow Up Recommendations: Acute inpatient rehab (3hours/day) ?Assistance recommended at discharge: Frequent or constant Supervision/Assistance ?SLP Visit Diagnosis: Cognitive communication deficit (R41.841) ?Plan: Continue with current plan of care ? ? ? ? ?  ?  ?Kendra Watts, [place], CCC-SLP ?Acute Rehabilitation Services ?Office number [phone] ?Pager [phone] ? ? ?Kendra Watts ? ?11/18/2021, 1:42 PM ? ? ? ?

## 2021-11-18 NOTE — Progress Notes (Addendum)
STROKE TEAM PROGRESS NOTE  ? ?INTERVAL HISTORY ?Patient is seen in her room with no family at the bedside. Moved to floor yesterday. Patient is alert and oriented to person, place, and time this morning. PT recommending acute inpatient rehab.  ? ?Vitals:  ? 11/18/21 0500 11/18/21 0843 11/18/21 1108 11/18/21 1158  ?BP:  (!) 157/93  (!) 160/94  ?Pulse:  87  86  ?Resp:  (!) 23  20  ?Temp:  98.6 ?F (37 ?C)  97.6 ?F (36.4 ?C)  ?TempSrc:  Oral  Oral  ?SpO2:  94% 93% 94%  ?Weight: 39.4 kg     ?Height:      ? ?CBC:  ?Recent Labs  ?Lab 11/12/21 ?1610 11/12/21 ?1738 11/14/21 ?0607 11/18/21 ?0123  ?WBC 10.0  --  9.4 11.1*  ?NEUTROABS 6.7  --   --   --   ?HGB 11.5*   < > 10.7* 10.3*  ?HCT 37.1   < > 32.5* 32.7*  ?MCV 97.4  --  92.1 94.8  ?PLT 201  --  162 185  ? < > = values in this interval not displayed.  ? ?Basic Metabolic Panel:  ?Recent Labs  ?Lab 11/14/21 ?9983 11/14/21 ?1824 11/15/21 ?0604 11/18/21 ?0123  ?NA 138  --  140 146*  ?K 4.1  --  4.8 4.0  ?CL 103  --  104 105  ?CO2 29  --  32 33*  ?GLUCOSE 121*  --  126* 106*  ?BUN 8  --  11 16  ?CREATININE 0.63  --  0.52 0.57  ?CALCIUM 8.8*  --  9.0 9.4  ?MG 2.1 1.9  --   --   ?PHOS 4.6 4.3  --   --   ? ?Lipid Panel:  ?Recent Labs  ?Lab 11/12/21 ?1825  ?CHOL 152  ?TRIG 74  ?HDL 79  ?CHOLHDL 1.9  ?VLDL 15  ?Epworth 58  ? ?HgbA1c:  ?Recent Labs  ?Lab 11/12/21 ?1610  ?HGBA1C 5.6  ? ?Urine Drug Screen:  ?Recent Labs  ?Lab 11/12/21 ?3825  ?LABOPIA NONE DETECTED  ?COCAINSCRNUR NONE DETECTED  ?LABBENZ POSITIVE*  ?AMPHETMU NONE DETECTED  ?THCU NONE DETECTED  ?LABBARB NONE DETECTED  ?  ?Alcohol Level No results for input(s): ETH in the last 168 hours. ? ?IMAGING past 24 hours ?No results found. ? ?PHYSICAL EXAM ? ?Physical Exam  ?Constitutional: Frail elderly Caucasian lady. On 4L [place]. ?Psych: affect appropriate ?Eyes: No scleral injection ?HENT: No OP obstrucion ?MSK: no joint deformities.  ?Cardiovascular: Normal rate and regular rhythm.  ?Respiratory: Effort normal, non-labored  breathing ?Skin: WDL ? ?NEURO:  ?Mental Status: AA&Ox3  ?Speech/Language: speech is without dysarthria or aphasia.  Naming, repetition, fluency, and comprehension intact. ? ?Cranial Nerves:  ?II: PERRL. Visual fields full.  ?III, IV, VI: EOMI. Eyelids elevate symmetrically.  ?V: Sensation is intact to light touch and symmetrical to face.  ?VII: Smile is symmetrical.   ?VIII: hearing intact to voice. ?IX, X: Phonation is normal.  ?XII: tongue is midline without fasciculations. ?Motor: 5/5 strength in UE, LE 4/5 symmetrically ?Tone: is normal and bulk is normal ?Sensation- Intact to light touch bilaterally ?Coordination: FTN intact bilaterally, HKS: no ataxia in BLE.  ?Gait- deferred ? ? ?ASSESSMENT/PLAN ?Kendra Watts is a 76 year old female with a PMHx of HTN, HLD, CHF, hypothyroidism, non-functional left kidney, breast cancer, lung adenocarcinoma, tobacco abuse, COPD on 3 L home O2. She presented to the emergency department after a fall followed by L tonic-clonic activity witnessed by EMS. She  was intubated on arrival to the ED d/t decreased mental status. Keppra loaded and continued on 750 mg BID. CTH remarkable for acute hemorrhage in the L occipital lobe. Repeat CTH with stable hemorrhage size. LTM did not shown any electrographic seizure and was discontinued. Made DNR. Patient extubated 4/28. She is ready to transfer out of the ICU today ? ?ICH - left occipital ICH, unusual location, concerning for possible hemorrhagic infarct  ?MRI: Left occipital hematoma stable.  Some diffusion changes around it.  No metastasis or other lesions noted. ?CTA Head and Neck: 50% stenosis of the mid right common carotid, 50% stenosis of the distal L common carotid artery ?2D Echo: EF 55 to 60% ?LDL 58 ?HgbA1c 5.6 ?VTE prophylaxis -heparin subcu ?aspirin 81 mg daily prior to admission, now on No antithrombotic due to [place]. ?Will need 30d heart monitor, cardiology is aware and will arrange.  ?Therapy recommendations:  SNF ?Disposition:  TBD ? ?Seizure ?Status post Keppra load ?Long-term EEG negative ?Continue Keppra 750 twice daily ? ?Hypertension ?Home meds: Metoprolol ?BP stable on high and ?Resume home metoprolol 25 twice daily ? ?Hyperlipidemia ?Home meds:  rosuvastatin 10 mg daily ?LDL 58, goal < 70 ?Continue statin at discharge ? ?Tobacco abuse ?Current smoker ?Smoking cessation counseling provided ?Pt is willing to quit ? ?Other Stroke Risk Factors ?Advanced Age >/= 55  ?ETOH use, will be advised to drink no more than 1 drink(s) a day ? ?Other acute issues ?History of lung cancer ?History of breast cancer ?COPD with home oxygen ? ?Code Status: DNR ? ?Hospital day # 6 ? ? ?Corky Sox, MD ?PGY-1 ? ?ATTENDING NOTE: ?I reviewed above note and agree with the assessment and plan. Pt was seen and examined.  ? ?No family at bedside.  Patient lying in bed, awake alert, orientated to age, place and time.  Still has dysarthria but no aphasia and follows simple commands.  Less lethargic than yesterday.  Right upper extremity weakness improved from yesterday, no drift today.  Strength and sensation symmetrical bilaterally.  Bilateral finger-nose slow but intact. ? ?Patient heart rate much improved after restarting beta-blocker, BP stable but at high end, will add amlodipine.  Continue Keppra.  PT/OT recommend SNF. ? ?For detailed assessment and plan, please refer to above as I have made changes wherever appropriate.  ? ?Neurology will sign off. Please call with questions. Pt will follow up with stroke clinic NP at [facility] in about 4 weeks. Thanks for the consult. ? ? ?Rosalin Hawking, MD PhD ?Stroke Neurology ?11/18/2021 ?4:45 PM ? ? ? ?To contact Stroke Continuity provider, please refer to [email or web address] ?After hours, contact General Neurology ? ? ?

## 2021-11-18 NOTE — Progress Notes (Signed)
?PROGRESS NOTE ? ? ? ?Kendra Watts  NLZ:[id number] DOB: 1945/12/10 DOA: 11/12/2021 ?PCP: Merrilee Seashore, MD  ? ? ?Chief Complaint  ?Patient presents with  ? Seizures  ? ? ?Brief Narrative:  ?76 year old woman who presented to [facility] ED 4/24 via EMS for Code Stroke due to AMS, seizure activity. [place] ~1500 when patient was found down. PMHx significant for HTN, HLD, CHF (Echo 04/2020 LVEF 60-65%,), COPD (on 3L home O2), RAS with nonfunctioning L kidney, breast CA (s/p radiation 2017-2020), lung adenocarcinoma. ?  ?Per chart review, patient was found down at home around 1500 by a friend; she was down minutes at most prior to being discovered.  Tonic-clonic seizure-like activity was noted en route with EMS and patient was unresponsive on arrival to ED with ongoing tonic BUE seizure activity.  While in ED, patient was intubated for airway protection and was noted to have posturing/biting ETT. Code Stroke initiated; Neurology was consulted and EEG was initiated with Keppra load, propofol/Versed drips.  Patient was noted to be hypertensive and clevidipine drip was started. CT Head showed small acute parenchymal hemorrhage in the left septal lobe, unlikely causing patient's symptoms.  CTA Head/Neck was grossly unremarkable , C-spine negative for fracture. ?  ?PCCM was consulted for ventilator management. ? ? ?Assessment & Plan: ?  ?Principal Problem: ?  Intracerebral hemorrhage ?Active Problems: ?  Status epilepticus ([place]) ?  COPD with acute exacerbation ([place]) ?  Acute respiratory failure with hypoxia ([place]) ?  Anemia ?  Adenocarcinoma, lung, right ([place]) ?  Protein-calorie malnutrition, severe ?  Purulent bronchitis ([place]) ?  Urinary retention ?  DNR (do not resuscitate) ? ?#1 occipital ICH-likely hypertensive etiology, concerning for possible hemorrhagic infarct ?-Head CT done with small acute parenchymal hemorrhage in the left occipital lobe.  Suspect trace adjacent subdural hemorrhage along the left tentorial  leaflet ?-MRI brain done with left occipital hematoma stable.  Some diffusion changes around it.  No metastases or other lesions noted. ?-CT angiogram head and neck with 50% stenosis of the mid right common carotid, 50% stenosis of the distal left common carotid artery. ?-2D echo done with a EF of 55 to 60%,NWMA, no source of emboli noted. ?-Hemoglobin A1c 5.6. ?-LDL noted at 58. ?-Patient noted to be on aspirin prior to admission, aspirin discontinued and not on antithrombotic treatment due to [place]. ?-Per neurology patient will need a 30-day heart monitor, cardiology informed. ?-Patient seen by physical therapy recommending CIR versus SNF. ?-Per neurology. ? ?2.  Status epilepticus ?-Likely secondary to problem #1. ?-Patient noted on presentation to have witnessed tonic-clonic seizures per EMS, given benzodiazepine loaded with Keppra and intubated in the ED. ?-Patient was loaded with Keppra and currently on Keppra 750 mg twice daily. ?-EEG obtained negative. ?-Patient with no further seizure like activities. ?-Per neurology. ? ?3.  Acute on chronic respiratory failure with hypoxia/COPD exacerbation ?-Patient admitted and intubated for airway protection. ?-Patient with history of COPD on 3 L nasal cannula. ?-COPD exacerbation and received a short course of parenteral steroids. ?-Currently on MDIs and nebulizers. ?-Status post course of antibiotics. ?-Patient with some thoracoabdominal breathing on examination, decreased breath sounds in the bases, shallow breaths. ?-Discontinue MDIs and placed on Pulmicort nebs twice daily, Brovana nebs twice daily, scheduled Xopenex and Atrovent nebs, IV Solu-Medrol 40 mg every 8 hours. ?-Incentive spirometry, flutter valve. ? ?4.  Acute metabolic encephalopathy ?-Likely secondary to problems #1,2. . ?-Clinical improvement and close to baseline. ? ?5.  Hypertension ?-Patient noted to have elevated blood  pressures on admission and required Cleviprex drip which has subsequently been  weaned off. ?-Continue Lopressor 25 mg twice daily. ? ?6.  Hypothyroidism ?-Continue home regimen Synthroid. ? ?7.  Hyperlipidemia ?-LDL at goal of 58. ?-Resume home regimen Crestor 10 mg daily. ? ?8.  Tobacco abuse ?-Tobacco cessation. ?-Patient noted to be willing to quit. ?-Declining nicotine patch at this time. ? ?9.  History of lung cancer/history of breast cancer ?-Outpatient follow-up with oncology. ? ?10.  Severe protein calorie malnutrition ?-Dietitian following. ?-Nutritional supplementation. ? ?11.  Urinary retention ?-Check a postvoid residual if PVR greater than 300 we will place Foley catheter. ? ? ?DVT prophylaxis: Heparin ?Code Status: DNR ?Family Communication: Updated patient and friend at bedside. ?Disposition: TBD ? ?Status is: Inpatient ?Remains inpatient appropriate because: Severity of illness. ?  ?Consultants:  ?PCCM admission ?Neurology ? ? ?Significant Hospital Events: ?Including procedures, antibiotic start and stop dates in addition to other pertinent events   ?4/24 - Admitted via [facility] ED for AMS, seizure-like activity. CT Head with small L occipital parenchymal hemorrhage, CTA Head/Neck without LVO. Neuro c/s, EEG/AEDs started. ?4/25 no further seizures LTM stopped. MRI Redemonstrated acute/recent hemorrhage in the left occipital lobe, likely similar when comparing across modalities. No masslike enhancement in this region to suggest a metastasis; however, acute blood products limits assessment. ?4/26 weaning sedation. Tolerating PSV but mental status barrier to extubation. Off Cleviprex  ?4/27 weaning. More awake;  ?4/28 still w/ thick sputum vanc and cefepime started. Passed SBT->extubated. Sedation stopped.  ?4/29 transfer out of ICU  ?Interim History / Subjective:  ?  ?Extubated 4/28/ ? ? ? ?Procedures:  ?CT head 4/24/20203 ?Chest x-ray 11/12/2021, 11/15/2021, 11/16/2021 ?MRI brain 11/13/2021 ?2D echo 11/13/2021 ?CT angiogram head 11/12/2021 ?CT C-spine 11/12/2021 ? ?Antimicrobials:   ?Anti-infectives (From admission, onward)  ? ? Start     Dose/Rate Route Frequency Ordered Stop  ? 11/17/21 1030  vancomycin (VANCOREADY) IVPB 500 mg/100 mL  Status:  Discontinued       ? 500 mg ?100 mL/hr over 60 Minutes Intravenous Every 24 hours 11/16/21 0941 11/17/21 0734  ? 11/16/21 1000  ceFEPIme (MAXIPIME) 2 g in sodium chloride 0.9 % 100 mL IVPB  Status:  Discontinued       ? 2 g ?200 mL/hr over 30 Minutes Intravenous Every 12 hours 11/16/21 0814 11/16/21 0905  ? 11/16/21 1000  ceFEPIme (MAXIPIME) 2 g in sodium chloride 0.9 % 100 mL IVPB  Status:  Discontinued       ? 2 g ?200 mL/hr over 30 Minutes Intravenous Every 12 hours 11/16/21 0905 11/17/21 1128  ? 11/16/21 1000  vancomycin (VANCOREADY) IVPB 750 mg/150 mL       ? 750 mg ?150 mL/hr over 60 Minutes Intravenous  Once 11/16/21 0905 11/16/21 1151  ? 11/16/21 0900  vancomycin (VANCOREADY) IVPB 750 mg/150 mL  Status:  Discontinued       ? 750 mg ?150 mL/hr over 60 Minutes Intravenous  Once 11/16/21 5027 11/16/21 0905  ? ?  ?  ? ? ?Subjective: ?Patient laying in bed.  Alert oriented to self place and time.  Denies any significant chest pain.  Denies any significant shortness of breath.  No abdominal pain.  Per RN patient with some congestion.)  At bedside. ? ?Objective: ?Vitals:  ? 11/18/21 0500 11/18/21 0843 11/18/21 1108 11/18/21 1158  ?BP:  (!) 157/93  (!) 160/94  ?Pulse:  87  86  ?Resp:  (!) 23  20  ?Temp:  98.6 ?F (  37 ?C)  97.6 ?F (36.4 ?C)  ?TempSrc:  Oral  Oral  ?SpO2:  94% 93% 94%  ?Weight: 39.4 kg     ?Height:      ? ?No intake or output data in the 24 hours ending 11/18/21 1438 ? ?Filed Weights  ? 11/16/21 0300 11/17/21 0500 11/18/21 0500  ?Weight: 39.2 kg 39.3 kg 39.4 kg  ? ? ?Examination: ? ?General exam: Frail. ?Respiratory system: Shallow breath sounds.  No wheezing.  Poor to fair air movement.  Some thoracoabdominal breathing.  ?Cardiovascular system: S1 & S2 heard, RRR. No JVD, murmurs, rubs, gallops or clicks. No pedal  edema. ?Gastrointestinal system: Abdomen is nondistended, soft and nontender. No organomegaly or masses felt. Normal bowel sounds heard. ?Central nervous system: Alert and oriented.  Moving extremities spontaneously. ?Extremities: Symmetric 5 x

## 2021-11-18 NOTE — Progress Notes (Signed)
RT note-Nebulizer's reordered and given at this time, patient is breathing very shallow, has a fair cough when asked to but not productive, incentive spirometer given and flutter valve instructed.  ?

## 2021-11-19 DIAGNOSIS — I611 Nontraumatic intracerebral hemorrhage in hemisphere, cortical: Secondary | ICD-10-CM | POA: Diagnosis not present

## 2021-11-19 DIAGNOSIS — J9601 Acute respiratory failure with hypoxia: Secondary | ICD-10-CM | POA: Diagnosis not present

## 2021-11-19 DIAGNOSIS — J441 Chronic obstructive pulmonary disease with (acute) exacerbation: Secondary | ICD-10-CM | POA: Diagnosis not present

## 2021-11-19 DIAGNOSIS — R4182 Altered mental status, unspecified: Secondary | ICD-10-CM | POA: Diagnosis not present

## 2021-11-19 LAB — CBC
HCT: 28.7 % — ABNORMAL LOW (ref 36.0–46.0)
Hemoglobin: 8.9 g/dL — ABNORMAL LOW (ref 12.0–15.0)
MCH: 29.6 pg (ref 26.0–34.0)
MCHC: 31 g/dL (ref 30.0–36.0)
MCV: 95.3 fL (ref 80.0–100.0)
Platelets: 181 10*3/uL (ref 150–400)
RBC: 3.01 MIL/uL — ABNORMAL LOW (ref 3.87–5.11)
RDW: 14.8 % (ref 11.5–15.5)
WBC: 7.1 10*3/uL (ref 4.0–10.5)
nRBC: 0 % (ref 0.0–0.2)

## 2021-11-19 LAB — GLUCOSE, CAPILLARY
Glucose-Capillary: 105 mg/dL — ABNORMAL HIGH (ref 70–99)
Glucose-Capillary: 129 mg/dL — ABNORMAL HIGH (ref 70–99)
Glucose-Capillary: 155 mg/dL — ABNORMAL HIGH (ref 70–99)
Glucose-Capillary: 162 mg/dL — ABNORMAL HIGH (ref 70–99)
Glucose-Capillary: 174 mg/dL — ABNORMAL HIGH (ref 70–99)
Glucose-Capillary: 293 mg/dL — ABNORMAL HIGH (ref 70–99)

## 2021-11-19 LAB — BASIC METABOLIC PANEL
Anion gap: 7 (ref 5–15)
BUN: 17 mg/dL (ref 8–23)
CO2: 35 mmol/L — ABNORMAL HIGH (ref 22–32)
Calcium: 8.9 mg/dL (ref 8.9–10.3)
Chloride: 103 mmol/L (ref 98–111)
Creatinine, Ser: 0.57 mg/dL (ref 0.44–1.00)
GFR, Estimated: >60 mL/min (ref >60)
Glucose, Bld: 123 mg/dL — ABNORMAL HIGH (ref 70–99)
Potassium: 4.1 mmol/L (ref 3.5–5.1)
Sodium: 145 mmol/L (ref 135–145)

## 2021-11-19 MED ORDER — LEVALBUTEROL HCL 0.63 MG/3ML IN NEBU: 0.6300 mg | INHALATION_SOLUTION | Freq: Four times a day (QID) | RESPIRATORY_TRACT | Status: DC | PRN

## 2021-11-19 NOTE — Progress Notes (Addendum)
?PROGRESS NOTE ? ? ? ?Kendra Watts  QJJ:[id number] DOB: 02-25-1946 DOA: 11/12/2021 ?PCP: Merrilee Seashore, MD  ? ? ?Chief Complaint  ?Patient presents with  ? Seizures  ? ? ?Brief Narrative:  ?76 year old woman who presented to [facility] ED 4/24 via EMS for Code Stroke due to AMS, seizure activity. [place] ~1500 when patient was found down. PMHx significant for HTN, HLD, CHF (Echo 04/2020 LVEF 60-65%,), COPD (on 3L home O2), RAS with nonfunctioning L kidney, breast CA (s/p radiation 2017-2020), lung adenocarcinoma. ?  ?Per chart review, patient was found down at home around 1500 by a friend; she was down minutes at most prior to being discovered.  Tonic-clonic seizure-like activity was noted en route with EMS and patient was unresponsive on arrival to ED with ongoing tonic BUE seizure activity.  While in ED, patient was intubated for airway protection and was noted to have posturing/biting ETT. Code Stroke initiated; Neurology was consulted and EEG was initiated with Keppra load, propofol/Versed drips.  Patient was noted to be hypertensive and clevidipine drip was started. CT Head showed small acute parenchymal hemorrhage in the left septal lobe, unlikely causing patient's symptoms.  CTA Head/Neck was grossly unremarkable , C-spine negative for fracture. ?  ?PCCM was consulted for ventilator management. ? ? ?Assessment & Plan: ?  ?Principal Problem: ?  Intracerebral hemorrhage ?Active Problems: ?  Status epilepticus ([place]) ?  COPD with acute exacerbation ([place]) ?  Acute respiratory failure with hypoxia ([place]) ?  Anemia ?  Adenocarcinoma, lung, right ([place]) ?  Protein-calorie malnutrition, severe ?  Purulent bronchitis ([place]) ?  Urinary retention ?  DNR (do not resuscitate) ? ?#1 occipital ICH-likely hypertensive etiology, concerning for possible hemorrhagic infarct ?-Head CT done with small acute parenchymal hemorrhage in the left occipital lobe.  Suspect trace adjacent subdural hemorrhage along the left tentorial  leaflet ?-MRI brain done with left occipital hematoma stable.  Some diffusion changes around it.  No metastases or other lesions noted. ?-CT angiogram head and neck with 50% stenosis of the mid right common carotid, 50% stenosis of the distal left common carotid artery. ?-2D echo done with a EF of 55 to 60%,NWMA, no source of emboli noted. ?-Hemoglobin A1c 5.6. ?-LDL noted at 58. ?-Patient noted to be on aspirin prior to admission, aspirin discontinued and not on antithrombotic treatment due to [place]. ?-Per neurology patient will need a 30-day heart monitor, cardiology informed. ?-Patient seen by physical therapy recommending CIR versus SNF. ?-Patient assessed by CIR and felt not a candidate at this time as patient does not have 24-hour supervision at home post discharge if she were to go to CIR. ?-TOC consulted for SNF placement. ?-Per neurology. ? ?2.  Status epilepticus ?-Likely secondary to problem #1. ?-Patient noted on presentation to have witnessed tonic-clonic seizures per EMS, given benzodiazepine loaded with Keppra and intubated in the ED. ?-Patient was loaded with Keppra and currently on Keppra 750 mg twice daily. ?-EEG obtained negative. ?-Patient with no further seizure like activities. ?-Per neurology. ? ?3.  Acute on chronic respiratory failure with hypoxia/COPD exacerbation ?-Patient admitted and intubated for airway protection. ?-Patient with history of COPD on 3 L nasal cannula. ?-COPD exacerbation and received a short course of parenteral steroids. ?-Was on MDIs and nebulizers. ?-Status post full course of antibiotics. ?-Patient with some thoracoabdominal breathing on examination, decreased breath sounds in the bases, shallow breaths on 11/18/2021.. ?-Discontinued MDIs and placed on Pulmicort nebs twice daily, Brovana nebs twice daily, scheduled Xopenex and Atrovent nebs, IV  Solu-Medrol 40 mg every 8 hours. ?-Patient with clinical improvement. ?-Decrease IV Solu-Medrol to 40 mg every 12  hours ?-Continue current nebs. ?-Incentive spirometry, flutter valve. ? ?4.  Acute metabolic encephalopathy ?-Likely secondary to problems #1,2. . ?-Clinical improvement and close to baseline. ? ?5.  Hypertension ?-Patient noted to have elevated blood pressures on admission and required Cleviprex drip which has subsequently been weaned off. ?-Continue Lopressor 25 mg twice daily. ? ?6.  Hypothyroidism ?-Synthroid.   ? ?7.  Hyperlipidemia ?-LDL at goal of 58. ?-Continue Crestor.   ? ?8.  Tobacco abuse ?-Tobacco cessation. ?-Patient noted to be willing to quit. ?-Declining nicotine patch at this time. ? ?9.  History of lung cancer/history of breast cancer ?-Outpatient follow-up with oncology. ? ?10.  Severe protein calorie malnutrition ?-Dietitian following. ?-Nutritional supplementation. ? ?11.  Urinary retention ?-PVR ordered and pending. ? ?12.  Anemia ?-Patient with no overt bleeding. ?-Check an anemia panel. ?-Follow H&H. ?-Transfusion threshold hemoglobin < 7. ? ? ?DVT prophylaxis: Heparin ?Code Status: DNR ?Family Communication: Updated patient and friend at bedside. ?Disposition: SNF ? ?Status is: Inpatient ?Remains inpatient appropriate because: Severity of illness. ?  ?Consultants:  ?PCCM admission ?Neurology ? ? ?Significant Hospital Events: ?Including procedures, antibiotic start and stop dates in addition to other pertinent events   ?4/24 - Admitted via [facility] ED for AMS, seizure-like activity. CT Head with small L occipital parenchymal hemorrhage, CTA Head/Neck without LVO. Neuro c/s, EEG/AEDs started. ?4/25 no further seizures LTM stopped. MRI Redemonstrated acute/recent hemorrhage in the left occipital lobe, likely similar when comparing across modalities. No masslike enhancement in this region to suggest a metastasis; however, acute blood products limits assessment. ?4/26 weaning sedation. Tolerating PSV but mental status barrier to extubation. Off Cleviprex  ?4/27 weaning. More awake;  ?4/28 still w/  thick sputum vanc and cefepime started. Passed SBT->extubated. Sedation stopped.  ?4/29 transfer out of ICU  ?Interim History / Subjective:  ?  ?Extubated 4/28/ ? ? ? ?Procedures:  ?CT head 4/24/20203 ?Chest x-ray 11/12/2021, 11/15/2021, 11/16/2021 ?MRI brain 11/13/2021 ?2D echo 11/13/2021 ?CT angiogram head 11/12/2021 ?CT C-spine 11/12/2021 ? ?Antimicrobials:  ?Anti-infectives (From admission, onward)  ? ? Start     Dose/Rate Route Frequency Ordered Stop  ? 11/17/21 1030  vancomycin (VANCOREADY) IVPB 500 mg/100 mL  Status:  Discontinued       ? 500 mg ?100 mL/hr over 60 Minutes Intravenous Every 24 hours 11/16/21 0941 11/17/21 0734  ? 11/16/21 1000  ceFEPIme (MAXIPIME) 2 g in sodium chloride 0.9 % 100 mL IVPB  Status:  Discontinued       ? 2 g ?200 mL/hr over 30 Minutes Intravenous Every 12 hours 11/16/21 0814 11/16/21 0905  ? 11/16/21 1000  ceFEPIme (MAXIPIME) 2 g in sodium chloride 0.9 % 100 mL IVPB  Status:  Discontinued       ? 2 g ?200 mL/hr over 30 Minutes Intravenous Every 12 hours 11/16/21 0905 11/17/21 1128  ? 11/16/21 1000  vancomycin (VANCOREADY) IVPB 750 mg/150 mL       ? 750 mg ?150 mL/hr over 60 Minutes Intravenous  Once 11/16/21 0905 11/16/21 1151  ? 11/16/21 0900  vancomycin (VANCOREADY) IVPB 750 mg/150 mL  Status:  Discontinued       ? 750 mg ?150 mL/hr over 60 Minutes Intravenous  Once 11/16/21 6948 11/16/21 0905  ? ?  ?  ? ? ?Subjective: ?Sitting up in bed.  No chest pain.  No shortness of breath.  Feels shortness of breath  is improved over the past 24 hours.  Tolerating current diet.  ? ?Objective: ?Vitals:  ? 11/19/21 0500 11/19/21 0814 11/19/21 1258 11/19/21 1611  ?BP:  (!) 169/99 115/73 (!) 147/86  ?Pulse:  92 86 85  ?Resp:  18 18 18   ?Temp:  98.2 ?F (36.8 ?C) 98 ?F (36.7 ?C) 98.3 ?F (36.8 ?C)  ?TempSrc:  Oral Oral Oral  ?SpO2:  97% 93% 91%  ?Weight: 39.5 kg     ?Height:      ? ? ?Intake/Output Summary (Last 24 hours) at 11/19/2021 1735 ?Last data filed at 11/19/2021 0346 ?Gross per 24 hour  ?Intake  570.63 ml  ?Output 800 ml  ?Net -229.37 ml  ? ? ?Filed Weights  ? 11/17/21 0500 11/18/21 0500 11/19/21 0500  ?Weight: 39.3 kg 39.4 kg 39.5 kg  ? ? ?Examination: ? ?General exam: Frail. ?Respiratory system: Improved air movement.

## 2021-11-19 NOTE — Care Management Important Message (Signed)
Important Message ? ?Patient Details  ?Name: Kendra Watts ?MRN: [id number] ?Date of Birth: [birth year] ? ? ?Medicare Important Message Given:  Yes ? ? ? ? ?[name] ?11/19/2021, 4:09 PM ?

## 2021-11-19 NOTE — Progress Notes (Signed)
Physical Therapy Treatment ?Patient Details ?Name: Kendra Watts ?MRN: [id number] ?DOB: May 11, 1946 ?Today's Date: 11/19/2021 ? ? ?History of Present Illness 76 y.o. female who presents to [facility] hospital 11/12/2021 after being found down with seizure like activity and AMS. CT head showed L occipital parenchymal hemorrhage.  Pt intubated on arrival to ED, extubated 4/28. PMH: breast CA, COPD on chronic home 4L O2, CHF, HTN, L hip fx. ? ?  ?PT Comments  ? ? Pt was confused and tangential/repetitive with thought processes.  Followed simple commands with cues well.  Emphasis on transitions to EOB, scooting, sit to stand from various surfaces, self peri-care sitting on the toilet, washing her entire front and progression of gait to bathroom then 65 feet into the hall with RW and 1-2 person min assist. ?   ?Recommendations for follow up therapy are one component of a multi-disciplinary discharge planning process, led by the attending physician.  Recommendations may be updated based on patient status, additional functional criteria and insurance authorization. ? ?Follow Up Recommendations ? Acute inpatient rehab (3hours/day) ?  ?  ?Assistance Recommended at Discharge Frequent or constant Supervision/Assistance  ?Patient can return home with the following A little help with walking and/or transfers;A little help with bathing/dressing/bathroom;Assistance with cooking/housework;Assist for transportation;Help with stairs or ramp for entrance ?  ?Equipment Recommendations ? BSC/3in1  ?  ?Recommendations for Other Services   ? ? ?  ?Precautions / Restrictions Precautions ?Precautions: Fall  ?  ? ?Mobility ? Bed Mobility ?Overal bed mobility: Needs Assistance ?Bed Mobility: Supine to Sit ?  ?  ?Supine to sit: Min assist, HOB elevated ?  ?  ?General bed mobility comments: pt moving slowly and stopping frequently needing redirection.  Given extra time, pt only needed minimal truncal assist up. ?  ? ?Transfers ?Overall transfer level:  Needs assistance ?Equipment used: Rolling walker (2 wheels) ?Transfers: Sit to/from Stand ?Sit to Stand: Min assist, +2 safety/equipment ?  ?  ?  ?  ?  ?General transfer comment: cues for hand placement and assist for forward and boost. ?  ? ?Ambulation/Gait ?Ambulation/Gait assistance: Min assist, +2 physical assistance ?Gait Distance (Feet): 15 Feet (then extra 65 feet after using the bathroom with RW in the hall.) ?Assistive device: Rolling walker (2 wheels) ?Gait Pattern/deviations: Step-through pattern ?Gait velocity: decreased ?Gait velocity interpretation: <1.8 ft/sec, indicate of risk for recurrent falls ?  ?General Gait Details: short mildly unsteady steps with episodes of scissoring.  Consistent drift to the right with +2 assist for directing the RW. ? ? ?Stairs ?  ?  ?  ?  ?  ? ? ?Wheelchair Mobility ?  ? ?Modified Rankin (Stroke Patients Only) ?  ? ? ?  ?Balance Overall balance assessment: Needs assistance ?Sitting-balance support: No upper extremity supported, Feet supported ?Sitting balance-Leahy Scale: Fair ?Sitting balance - Comments: able to wash up her front side down to her feet while sitting on the toilet. ?  ?Standing balance support: Bilateral upper extremity supported ?Standing balance-Leahy Scale: Poor ?Standing balance comment: reliant on UE support on AD or external support. ?  ?  ?  ?  ?  ?  ?  ?  ?  ?  ?  ?  ? ?  ?Cognition Arousal/Alertness: Awake/alert ?Behavior During Therapy: [facility] for tasks assessed/performed ?Overall Cognitive Status: Impaired/Different from baseline ?  ?  ?  ?  ?  ?  ?  ?  ?  ?Orientation Level: Disoriented to, Place, Time, Situation ?  ?Memory: Decreased recall  of precautions ?  ?Safety/Judgement: Decreased awareness of safety, Decreased awareness of deficits ?Awareness: Intellectual ?Problem Solving: Slow processing ?General Comments: following simple commands with cues, increased time ?  ?  ? ?  ?Exercises   ? ?  ?General Comments General comments (skin integrity,  edema, etc.): vss on 3L ?  ?  ? ?Pertinent Vitals/Pain Pain Assessment ?Pain Assessment: Faces ?Faces Pain Scale: No hurt ?Pain Intervention(s): Monitored during session  ? ? ?Home Living   ?  ?  ?  ?  ?  ?  ?  ?  ?  ?   ?  ?Prior Function    ?  ?  ?   ? ?PT Goals (current goals can now be found in the care plan section) Acute Rehab PT Goals ?PT Goal Formulation: With patient ?Time For Goal Achievement: 11/30/21 ?Potential to Achieve Goals: Fair ?Progress towards PT goals: Progressing toward goals ? ?  ?Frequency ? ? ? Min 3X/week ? ? ? ?  ?PT Plan Current plan remains appropriate  ? ? ?Co-evaluation   ?  ?  ?  ?  ? ?  ?AM-PAC PT "6 Clicks" Mobility   ?Outcome Measure ? Help needed turning from your back to your side while in a flat bed without using bedrails?: A Little ?Help needed moving from lying on your back to sitting on the side of a flat bed without using bedrails?: A Lot ?Help needed moving to and from a bed to a chair (including a wheelchair)?: A Lot ?Help needed standing up from a chair using your arms (e.g., wheelchair or bedside chair)?: A Little ?Help needed to walk in hospital room?: A Lot ?Help needed climbing 3-5 steps with a railing? : A Lot ?6 Click Score: 14 ? ?  ?End of Session Equipment Utilized During Treatment: Oxygen ?Activity Tolerance: Patient tolerated treatment well ?Patient left: in chair;with call bell/phone within reach;with chair alarm set ?Nurse Communication: Mobility status ?PT Visit Diagnosis: Other abnormalities of gait and mobility (R26.89);Muscle weakness (generalized) (M62.81);Difficulty in walking, not elsewhere classified (R26.2) ?  ? ? ?Time: 1445-1510 ?PT Time Calculation (min) (ACUTE ONLY): 25 min ? ?Charges:  $Gait Training: 8-22 mins ?$Therapeutic Activity: 8-22 mins          ?          ? ?11/19/2021 ? ?Ginger Carne., PT ?Acute Rehabilitation Services ?[phone]  (pager) ?[phone]  (office) ? ? ?Tessie Fass Akansha Wyche ?11/19/2021, 5:46 PM ? ?

## 2021-11-20 DIAGNOSIS — J441 Chronic obstructive pulmonary disease with (acute) exacerbation: Secondary | ICD-10-CM | POA: Diagnosis not present

## 2021-11-20 DIAGNOSIS — J9601 Acute respiratory failure with hypoxia: Secondary | ICD-10-CM | POA: Diagnosis not present

## 2021-11-20 DIAGNOSIS — R4182 Altered mental status, unspecified: Secondary | ICD-10-CM | POA: Diagnosis not present

## 2021-11-20 DIAGNOSIS — I611 Nontraumatic intracerebral hemorrhage in hemisphere, cortical: Secondary | ICD-10-CM | POA: Diagnosis not present

## 2021-11-20 LAB — BASIC METABOLIC PANEL
Anion gap: 7 (ref 5–15)
BUN: 16 mg/dL (ref 8–23)
CO2: 34 mmol/L — ABNORMAL HIGH (ref 22–32)
Calcium: 9.1 mg/dL (ref 8.9–10.3)
Chloride: 100 mmol/L (ref 98–111)
Creatinine, Ser: 0.57 mg/dL (ref 0.44–1.00)
GFR, Estimated: >60 mL/min (ref >60)
Glucose, Bld: 155 mg/dL — ABNORMAL HIGH (ref 70–99)
Potassium: 3.5 mmol/L (ref 3.5–5.1)
Sodium: 141 mmol/L (ref 135–145)

## 2021-11-20 LAB — CBC
HCT: 31.7 % — ABNORMAL LOW (ref 36.0–46.0)
Hemoglobin: 10.3 g/dL — ABNORMAL LOW (ref 12.0–15.0)
MCH: 29.6 pg (ref 26.0–34.0)
MCHC: 32.5 g/dL (ref 30.0–36.0)
MCV: 91.1 fL (ref 80.0–100.0)
Platelets: 239 10*3/uL (ref 150–400)
RBC: 3.48 MIL/uL — ABNORMAL LOW (ref 3.87–5.11)
RDW: 14.7 % (ref 11.5–15.5)
WBC: 7.7 10*3/uL (ref 4.0–10.5)
nRBC: 0 % (ref 0.0–0.2)

## 2021-11-20 LAB — GLUCOSE, CAPILLARY
Glucose-Capillary: 121 mg/dL — ABNORMAL HIGH (ref 70–99)
Glucose-Capillary: 140 mg/dL — ABNORMAL HIGH (ref 70–99)
Glucose-Capillary: 153 mg/dL — ABNORMAL HIGH (ref 70–99)
Glucose-Capillary: 158 mg/dL — ABNORMAL HIGH (ref 70–99)
Glucose-Capillary: 177 mg/dL — ABNORMAL HIGH (ref 70–99)
Glucose-Capillary: 180 mg/dL — ABNORMAL HIGH (ref 70–99)

## 2021-11-20 LAB — FERRITIN: Ferritin: 159 ng/mL (ref 11–307)

## 2021-11-20 LAB — IRON AND TIBC
Iron: 60 ug/dL (ref 28–170)
Saturation Ratios: 25 % (ref 10.4–31.8)
TIBC: 244 ug/dL — ABNORMAL LOW (ref 250–450)
UIBC: 184 ug/dL

## 2021-11-20 LAB — FOLATE: Folate: 17.2 ng/mL (ref >5.9)

## 2021-11-20 LAB — VITAMIN B12: Vitamin B-12: 424 pg/mL (ref 180–914)

## 2021-11-20 MED ORDER — MOMETASONE FURO-FORMOTEROL FUM 200-5 MCG/ACT IN AERO
2.0000 | INHALATION_SPRAY | Freq: Two times a day (BID) | RESPIRATORY_TRACT | Status: DC
  Administered 2021-11-20 – 2021-11-25 (×10): 2 via RESPIRATORY_TRACT
  Filled 2021-11-20: qty 8.8

## 2021-11-20 MED ORDER — METHYLPREDNISOLONE SODIUM SUCC 40 MG IJ SOLR
40.0000 mg | Freq: Two times a day (BID) | INTRAMUSCULAR | Status: AC
  Administered 2021-11-20: 40 mg via INTRAVENOUS

## 2021-11-20 MED ORDER — OLANZAPINE 5 MG PO TBDP
2.5000 mg | ORAL_TABLET | Freq: Every evening | ORAL | Status: DC | PRN
  Administered 2021-11-24 (×2): 2.5 mg via ORAL
  Filled 2021-11-20 (×3): qty 0.5

## 2021-11-20 MED ORDER — PANTOPRAZOLE SODIUM 40 MG PO TBEC
40.0000 mg | DELAYED_RELEASE_TABLET | Freq: Every day | ORAL | Status: DC
  Administered 2021-11-20 – 2021-11-25 (×6): 40 mg via ORAL
  Filled 2021-11-20 (×6): qty 1

## 2021-11-20 MED ORDER — POTASSIUM CHLORIDE CRYS ER 10 MEQ PO TBCR
20.0000 meq | EXTENDED_RELEASE_TABLET | Freq: Once | ORAL | Status: AC
  Administered 2021-11-20: 20 meq via ORAL
  Filled 2021-11-20: qty 2

## 2021-11-20 MED ORDER — LORAZEPAM 2 MG/ML IJ SOLN
1.0000 mg | Freq: Once | INTRAMUSCULAR | Status: AC
  Administered 2021-11-20: 1 mg via INTRAVENOUS
  Filled 2021-11-20: qty 1

## 2021-11-20 MED ORDER — PREDNISONE 20 MG PO TABS
40.0000 mg | ORAL_TABLET | Freq: Every day | ORAL | Status: AC
  Administered 2021-11-21 – 2021-11-25 (×5): 40 mg via ORAL
  Filled 2021-11-20 (×5): qty 2

## 2021-11-20 MED ORDER — METHYLPREDNISOLONE SODIUM SUCC 40 MG IJ SOLR
40.0000 mg | Freq: Two times a day (BID) | INTRAMUSCULAR | Status: DC
  Filled 2021-11-20: qty 1

## 2021-11-20 MED ORDER — DOXAZOSIN MESYLATE 8 MG PO TABS
8.0000 mg | ORAL_TABLET | Freq: Every day | ORAL | Status: DC
Start: 2021-11-20 — End: 2021-11-25
  Administered 2021-11-20 – 2021-11-25 (×6): 8 mg via ORAL
  Filled 2021-11-20 (×6): qty 1

## 2021-11-20 NOTE — Progress Notes (Signed)
?  X-cover Note: ?Bedside RN reports pt is agitated. Trying to climb out of bed. Unable to redirect. Has pulled out two PIVs. ? ?Will order IV ativan and wrist restraints. ? ? ?Kristopher Oppenheim, DO ?[facility] ? ?

## 2021-11-20 NOTE — Progress Notes (Signed)
Physical Therapy Treatment ?Patient Details ?Name: Kendra Watts ?MRN: [id number] ?DOB: 1946-01-13 ?Today's Date: 11/20/2021 ? ? ?History of Present Illness 76 y.o. female who presents to [facility] hospital 11/12/2021 after being found down with seizure like activity and AMS. CT head showed L occipital parenchymal hemorrhage.  Pt intubated on arrival to ED, extubated 4/28. PMH: breast CA, COPD on chronic home 4L O2, CHF, HTN, L hip fx. ? ?  ?PT Comments  ? ? Pt slow to progress toward goals.  Confused, hard to follow conversationally, she does not divide her attention to more than 1 task well.  Emphasis on transfer safety and technique,  stability in standing for peri care, progression of gait with the RW ?   ?Recommendations for follow up therapy are one component of a multi-disciplinary discharge planning process, led by the attending physician.  Recommendations may be updated based on patient status, additional functional criteria and insurance authorization. ? ?Follow Up Recommendations ? Acute inpatient rehab (3hours/day) ?  ?  ?Assistance Recommended at Discharge Frequent or constant Supervision/Assistance  ?Patient can return home with the following A little help with walking and/or transfers;A little help with bathing/dressing/bathroom;Assistance with cooking/housework;Assist for transportation;Help with stairs or ramp for entrance ?  ?Equipment Recommendations ? BSC/3in1  ?  ?Recommendations for Other Services   ? ? ?  ?Precautions / Restrictions Precautions ?Precautions: Fall ?Restrictions ?Weight Bearing Restrictions: No  ?  ? ?Mobility ? Bed Mobility ?  ?  ?  ?  ?  ?  ?  ?General bed mobility comments: OOB in the chair on arrival ?  ? ?Transfers ?Overall transfer level: Needs assistance ?Equipment used: Rolling walker (2 wheels) ?Transfers: Sit to/from Stand, Bed to chair/wheelchair/BSC ?Sit to Stand: Min assist (+2 for safety not available) ?Stand pivot transfers: Mod assist ?  ?  ?  ?  ?General transfer comment:  repetitive cues for hand placement/safety, assist both forward and with boost, stability once standing for posterior bias.  Initially unsteady transferring to the 3 in 1. ?  ? ?Ambulation/Gait ?Ambulation/Gait assistance: Min assist, Mod assist (mod initiall due to posterior bias/retropulsive.) ?Gait Distance (Feet): 70 Feet ?Assistive device: Rolling walker (2 wheels) ?Gait Pattern/deviations: Step-through pattern ?Gait velocity: decreased ?Gait velocity interpretation: <1.31 ft/sec, indicative of household ambulator ?  ?General Gait Details: generally retropulsive early on, mildly unsteady, subtle improvement in forward propulsion after 15 to 20 feet then some fatigue at the end, with pt losing focus, L hand slumping off RW and list left. ? ? ?Stairs ?  ?  ?  ?  ?  ? ? ?Wheelchair Mobility ?  ? ?Modified Rankin (Stroke Patients Only) ?  ? ? ?  ?Balance Overall balance assessment: Needs assistance ?  ?Sitting balance-Leahy Scale: Fair ?  ?  ?Standing balance support: Bilateral upper extremity supported ?Standing balance-Leahy Scale: Poor ?Standing balance comment: reliant on external support ?  ?  ?  ?  ?  ?  ?  ?  ?  ?  ?  ?  ? ?  ?Cognition Arousal/Alertness: Awake/alert ?Behavior During Therapy: [facility] for tasks assessed/performed ?Overall Cognitive Status: Impaired/Different from baseline ?Area of Impairment: Orientation, Memory, Safety/judgement, Awareness, Problem solving ?  ?  ?  ?  ?  ?  ?  ?  ?Orientation Level: Disoriented to, Place, Time, Situation ?  ?Memory: Decreased recall of precautions ?  ?Safety/Judgement: Decreased awareness of safety, Decreased awareness of deficits ?Awareness: Intellectual ?Problem Solving: Slow processing ?General Comments: following simple commands with cues,  increased time ?  ?  ? ?  ?Exercises   ? ?  ?General Comments General comments (skin integrity, edema, etc.): pt placed on 3L to ambulate, returned to  1L [place] at end of session. ?  ?  ? ?Pertinent Vitals/Pain Pain  Assessment ?Pain Assessment: Faces ?Faces Pain Scale: No hurt ?Pain Intervention(s): Monitored during session  ? ? ?Home Living   ?  ?  ?  ?  ?  ?  ?  ?  ?  ?   ?  ?Prior Function    ?  ?  ?   ? ?PT Goals (current goals can now be found in the care plan section) Acute Rehab PT Goals ?PT Goal Formulation: With patient ?Time For Goal Achievement: 11/30/21 ?Potential to Achieve Goals: Fair ?Progress towards PT goals: Progressing toward goals ? ?  ?Frequency ? ? ? Min 3X/week ? ? ? ?  ?PT Plan Current plan remains appropriate  ? ? ?Co-evaluation   ?  ?  ?  ?  ? ?  ?AM-PAC PT "6 Clicks" Mobility   ?Outcome Measure ? Help needed turning from your back to your side while in a flat bed without using bedrails?: A Little ?Help needed moving from lying on your back to sitting on the side of a flat bed without using bedrails?: A Lot ?Help needed moving to and from a bed to a chair (including a wheelchair)?: A Lot ?Help needed standing up from a chair using your arms (e.g., wheelchair or bedside chair)?: A Little ?Help needed to walk in hospital room?: A Lot ?Help needed climbing 3-5 steps with a railing? : A Lot ?6 Click Score: 14 ? ?  ?End of Session Equipment Utilized During Treatment: Oxygen ?Activity Tolerance: Patient tolerated treatment well;Patient limited by fatigue ?Patient left: in chair;with call bell/phone within reach;with chair alarm set;with family/visitor present ?Nurse Communication: Mobility status ?PT Visit Diagnosis: Other abnormalities of gait and mobility (R26.89);Muscle weakness (generalized) (M62.81);Difficulty in walking, not elsewhere classified (R26.2) ?  ? ? ?Time: 9826-4158 ?PT Time Calculation (min) (ACUTE ONLY): 25 min ? ?Charges:  $Gait Training: 8-22 mins ?$Therapeutic Activity: 8-22 mins          ?          ? ?11/20/2021 ? ?Ginger Carne., PT ?Acute Rehabilitation Services ?[phone]  (pager) ?[phone]  (office) ? ? ?Tessie Fass Ollivander See ?11/20/2021, 3:33 PM ? ?

## 2021-11-20 NOTE — Progress Notes (Signed)
?PROGRESS NOTE ? ? ? ?Kendra Watts  CBJ:[id number] DOB: 11/17/1945 DOA: 11/12/2021 ?PCP: Merrilee Seashore, MD  ? ? ?Chief Complaint  ?Patient presents with  ? Seizures  ? ? ?Brief Narrative:  ?76 year old woman who presented to [facility] ED 4/24 via EMS for Code Stroke due to AMS, seizure activity. [place] ~1500 when patient was found down. PMHx significant for HTN, HLD, CHF (Echo 04/2020 LVEF 60-65%,), COPD (on 3L home O2), RAS with nonfunctioning L kidney, breast CA (s/p radiation 2017-2020), lung adenocarcinoma. ?  ?Per chart review, patient was found down at home around 1500 by a friend; she was down minutes at most prior to being discovered.  Tonic-clonic seizure-like activity was noted en route with EMS and patient was unresponsive on arrival to ED with ongoing tonic BUE seizure activity.  While in ED, patient was intubated for airway protection and was noted to have posturing/biting ETT. Code Stroke initiated; Neurology was consulted and EEG was initiated with Keppra load, propofol/Versed drips.  Patient was noted to be hypertensive and clevidipine drip was started. CT Head showed small acute parenchymal hemorrhage in the left septal lobe, unlikely causing patient's symptoms.  CTA Head/Neck was grossly unremarkable , C-spine negative for fracture. ?  ?PCCM was consulted for ventilator management. ? ? ?Assessment & Plan: ?  ?Principal Problem: ?  Intracerebral hemorrhage ?Active Problems: ?  Status epilepticus ([place]) ?  COPD with acute exacerbation ([place]) ?  Acute respiratory failure with hypoxia ([place]) ?  Anemia ?  Adenocarcinoma, lung, right ([place]) ?  Protein-calorie malnutrition, severe ?  Purulent bronchitis ([place]) ?  Urinary retention ?  DNR (do not resuscitate) ? ?#1 occipital ICH-likely hypertensive etiology, concerning for possible hemorrhagic infarct ?-Head CT done with small acute parenchymal hemorrhage in the left occipital lobe.  Suspect trace adjacent subdural hemorrhage along the left tentorial  leaflet ?-MRI brain done with left occipital hematoma stable.  Some diffusion changes around it.  No metastases or other lesions noted. ?-CT angiogram head and neck with 50% stenosis of the mid right common carotid, 50% stenosis of the distal left common carotid artery. ?-2D echo done with a EF of 55 to 60%,NWMA, no source of emboli noted. ?-Hemoglobin A1c 5.6. ?-LDL noted at 58. ?-Patient noted to be on aspirin prior to admission, aspirin discontinued and not on antithrombotic treatment due to [place]. ?-Per neurology patient will need a 30-day heart monitor, cardiology informed. ?-Patient seen by physical therapy recommending CIR versus SNF. ?-Patient assessed by CIR and felt not a candidate at this time as patient does not have 24-hour supervision at home post discharge if she were to go to CIR. ?-TOC consulted for SNF placement. ?-Per neurology. ? ?2.  Status epilepticus ?-Likely secondary to problem #1. ?-Patient noted on presentation to have witnessed tonic-clonic seizures per EMS, given benzodiazepine loaded with Keppra and intubated in the ED. ?-Patient was loaded with Keppra and currently on Keppra 750 mg twice daily. ?-EEG obtained negative. ?-Patient with no further seizure like activities. ?-Per neurology. ? ?3.  Acute on chronic respiratory failure with hypoxia/COPD exacerbation ?-Patient admitted and intubated for airway protection. ?-Patient with history of COPD on 3 L nasal cannula. ?-COPD exacerbation and received a short course of parenteral steroids. ?-Was on MDIs and nebulizers. ?-Status post full course of antibiotics. ?-Patient with some thoracoabdominal breathing on examination, decreased breath sounds in the bases, shallow breaths on 11/18/2021.. ?-Discontinued MDIs and placed on Pulmicort nebs twice daily, Brovana nebs twice daily, scheduled Xopenex and Atrovent nebs, IV  Solu-Medrol 40 mg every 8 hours. ?-Clinical improvement. ?-Transition to oral prednisone taper tomorrow. ?-Discontinue  Pulmicort and Brovana and placed on Dulera starting tomorrow. ?-Incentive spirometry, flutter valve. ? ?4.  Acute metabolic encephalopathy ?-Likely secondary to problems #1,2. . ?-Clinical improvement and close to baseline. ? ?5.  Hypertension ?-Patient noted to have elevated blood pressures on admission and required Cleviprex drip which has subsequently been weaned off. ?-Blood pressure controlled on current regimen of Lopressor. ? ?6.  Hypothyroidism ?-Continue Synthroid.   ? ?7.  Hyperlipidemia ?-LDL at goal of 58. ?-Continue Crestor.   ? ?8.  Tobacco abuse ?-Tobacco cessation. ?-Patient noted to be willing to quit. ?-Declining nicotine patch at this time. ? ?9.  History of lung cancer/history of breast cancer ?-Outpatient follow-up with oncology. ? ?10.  Severe protein calorie malnutrition ?-Dietitian following. ?-Nutritional supplementation. ? ?11.  Urinary retention ?-PVR ordered and pending. ? ?12.  Anemia ?-Patient with no overt bleeding. ?-Hemoglobin stable at 10.3 ?-Anemia panel consistent with anemia of chronic disease. ?-Follow H&H. ?-Transfusion threshold hemoglobin < 7. ? ?13.  Agitation likely secondary to sundowning ?-Patient noted to have a bout of agitation overnight and received some IV Ativan. ?-Place on Zyprexa as needed. ? ? ?DVT prophylaxis: Heparin ?Code Status: DNR ?Family Communication: Updated patient.  No family at bedside.   ?Disposition: SNF ? ?Status is: Inpatient ?Remains inpatient appropriate because: Severity of illness. ?  ?Consultants:  ?PCCM admission ?Neurology ? ? ?Significant Hospital Events: ?Including procedures, antibiotic start and stop dates in addition to other pertinent events   ?4/24 - Admitted via [facility] ED for AMS, seizure-like activity. CT Head with small L occipital parenchymal hemorrhage, CTA Head/Neck without LVO. Neuro c/s, EEG/AEDs started. ?4/25 no further seizures LTM stopped. MRI Redemonstrated acute/recent hemorrhage in the left occipital lobe, likely similar  when comparing across modalities. No masslike enhancement in this region to suggest a metastasis; however, acute blood products limits assessment. ?4/26 weaning sedation. Tolerating PSV but mental status barrier to extubation. Off Cleviprex  ?4/27 weaning. More awake;  ?4/28 still w/ thick sputum vanc and cefepime started. Passed SBT->extubated. Sedation stopped.  ?4/29 transfer out of ICU  ?Interim History / Subjective:  ?  ?Extubated 4/28/ ? ? ? ?Procedures:  ?CT head 4/24/20203 ?Chest x-ray 11/12/2021, 11/15/2021, 11/16/2021 ?MRI brain 11/13/2021 ?2D echo 11/13/2021 ?CT angiogram head 11/12/2021 ?CT C-spine 11/12/2021 ? ?Antimicrobials:  ?Anti-infectives (From admission, onward)  ? ? Start     Dose/Rate Route Frequency Ordered Stop  ? 11/17/21 1030  vancomycin (VANCOREADY) IVPB 500 mg/100 mL  Status:  Discontinued       ? 500 mg ?100 mL/hr over 60 Minutes Intravenous Every 24 hours 11/16/21 0941 11/17/21 0734  ? 11/16/21 1000  ceFEPIme (MAXIPIME) 2 g in sodium chloride 0.9 % 100 mL IVPB  Status:  Discontinued       ? 2 g ?200 mL/hr over 30 Minutes Intravenous Every 12 hours 11/16/21 0814 11/16/21 0905  ? 11/16/21 1000  ceFEPIme (MAXIPIME) 2 g in sodium chloride 0.9 % 100 mL IVPB  Status:  Discontinued       ? 2 g ?200 mL/hr over 30 Minutes Intravenous Every 12 hours 11/16/21 0905 11/17/21 1128  ? 11/16/21 1000  vancomycin (VANCOREADY) IVPB 750 mg/150 mL       ? 750 mg ?150 mL/hr over 60 Minutes Intravenous  Once 11/16/21 0905 11/16/21 1151  ? 11/16/21 0900  vancomycin (VANCOREADY) IVPB 750 mg/150 mL  Status:  Discontinued       ?  750 mg ?150 mL/hr over 60 Minutes Intravenous  Once 11/16/21 5102 11/16/21 0905  ? ?  ?  ? ? ?Subjective: ?Events overnight noted.  Patient sitting up in bed, alert and oriented to self place and time.  No chest pain.  No shortness of breath.  Feeling better.   ? ?Objective: ?Vitals:  ? 11/20/21 0424 11/20/21 5852 11/20/21 0754 11/20/21 7782  ?BP:    (!) 154/87  ?Pulse:    92  ?Resp:    16   ?Temp:    97.8 ?F (36.6 ?C)  ?TempSrc:    Axillary  ?SpO2:  (!) 88% 94% 94%  ?Weight: 42.9 kg     ?Height:      ? ? ?Intake/Output Summary (Last 24 hours) at 11/20/2021 1204 ?Last data filed at 11/20/2021 0600 ?Gross per 24 hou

## 2021-11-20 NOTE — Progress Notes (Signed)
Patient restless, climbing out of bed all night thinking she's at her apartment and staff are burglars, unable to reorient. Patient started swatting at staff and pulling lines. On call provider notified; Ativan and wrist restraints ordered. ? ?Update: ?Wrist restraints in place and Ativan given. Pt in bed sleeping. Sister, [name], notified of restraints placement.  ?

## 2021-11-20 NOTE — Progress Notes (Signed)
Occupational Therapy Treatment ?Patient Details ?Name: Kendra Watts ?MRN: [id number] ?DOB: Aug 17, 1945 ?Today's Date: 11/20/2021 ? ? ?History of present illness 76 y.o. female who presents to [facility] hospital 11/12/2021 after being found down with seizure like activity and AMS. CT head showed L occipital parenchymal hemorrhage.  Pt intubated on arrival to ED, extubated 4/28. PMH: breast CA, COPD on chronic home 4L O2, CHF, HTN, L hip fx. ?  ?OT comments ? Pt making slow progress towards goals this session, able to complete ADLs min-max A, pt unaware of incontinent episode, requiring min A +2 for BSC and chair transfers during session without AD. Pt perseverative with tasks, requiring increased verbal cuing for redirection. Pt presenting with impairments listed below, will follow acutely. Continue to recommend AIR at d/c.  ? ?Recommendations for follow up therapy are one component of a multi-disciplinary discharge planning process, led by the attending physician.  Recommendations may be updated based on patient status, additional functional criteria and insurance authorization. ?   ?Follow Up Recommendations ? Acute inpatient rehab (3hours/day)  ?  ?Assistance Recommended at Discharge Frequent or constant Supervision/Assistance  ?Patient can return home with the following ? A lot of help with walking and/or transfers;A lot of help with bathing/dressing/bathroom ?  ?Equipment Recommendations ? None recommended by OT;Other (comment) (defer to next venue of care)  ?  ?Recommendations for Other Services   ? ?  ?Precautions / Restrictions Precautions ?Precautions: Fall ?Restrictions ?Weight Bearing Restrictions: No  ? ? ?  ? ?Mobility Bed Mobility ?Overal bed mobility: Needs Assistance ?Bed Mobility: Supine to Sit ?  ?  ?Supine to sit: Mod assist, HOB elevated, Max assist, +2 for physical assistance ?  ?  ?General bed mobility comments: increased cuing and physical assistance, helicopter method used ?  ? ?Transfers ?Overall  transfer level: Needs assistance ?  ?Transfers: Sit to/from Stand ?Sit to Stand: Min assist, +2 safety/equipment ?  ?  ?  ?  ?  ?General transfer comment: cues for hand placement and assist for forward and boost. ?  ?  ?Balance Overall balance assessment: Needs assistance ?Sitting-balance support: No upper extremity supported, Feet supported ?Sitting balance-Leahy Scale: Fair ?Sitting balance - Comments: anterior lean sitting EOB ?  ?Standing balance support: Bilateral upper extremity supported ?Standing balance-Leahy Scale: Poor ?Standing balance comment: reliant on external support (2person assist) ?  ?  ?  ?  ?  ?  ?  ?  ?  ?  ?  ?   ? ?ADL either performed or assessed with clinical judgement  ? ?ADL Overall ADL's : Needs assistance/impaired ?  ?  ?Grooming: Dance movement psychotherapist;Wash/dry hands;Sitting ?Grooming Details (indicate cue type and reason): perseverative ?  ?  ?  ?  ?Upper Body Dressing : Minimal assistance;Sitting ?Upper Body Dressing Details (indicate cue type and reason): to don gown ?Lower Body Dressing: Maximal assistance;Sit to/from stand ?Lower Body Dressing Details (indicate cue type and reason): to don mesh underwear ?Toilet Transfer: Minimal assistance;+2 for physical assistance;Ambulation;Cueing for sequencing;Cueing for safety ?  ?Toileting- Clothing Manipulation and Hygiene: Maximal assistance;Sit to/from stand ?Toileting - Clothing Manipulation Details (indicate cue type and reason): for pericare ?  ?  ?Functional mobility during ADLs: +2 for physical assistance;Minimal assistance;Cueing for safety;Cueing for sequencing ?  ?  ? ?Extremity/Trunk Assessment Upper Extremity Assessment ?Upper Extremity Assessment: Generalized weakness ?LUE Deficits / Details: Limited LUE AROM ?  ?Lower Extremity Assessment ?Lower Extremity Assessment: Defer to PT evaluation ?  ?  ?  ? ?Vision   ?  Vision Assessment?: No apparent visual deficits ?  ?Perception Perception ?Perception: Not tested ?  ?Praxis  Praxis ?Praxis: Not tested ?  ? ?Cognition Arousal/Alertness: Awake/alert ?Behavior During Therapy: [facility] for tasks assessed/performed ?Overall Cognitive Status: Impaired/Different from baseline ?Area of Impairment: Orientation, Memory, Safety/judgement, Awareness, Problem solving ?  ?  ?  ?  ?  ?  ?  ?  ?  ?  ?Memory: Decreased recall of precautions ?  ?Safety/Judgement: Decreased awareness of safety, Decreased awareness of deficits ?Awareness: Intellectual ?Problem Solving: Slow processing ?General Comments: following simple commands with cues, increased time ?  ?  ?   ?Exercises   ? ?  ?Shoulder Instructions   ? ? ?  ?General Comments VSS on 2L o2  ? ? ?Pertinent Vitals/ Pain       Pain Assessment ?Pain Assessment: No/denies pain ? ?Home Living   ?  ?  ?  ?  ?  ?  ?  ?  ?  ?  ?  ?  ?  ?  ?  ?  ?  ?  ? ?  ?Prior Functioning/Environment    ?  ?  ?  ?   ? ?Frequency ? Min 2X/week  ? ? ? ? ?  ?Progress Toward Goals ? ?OT Goals(current goals can now be found in the care plan section) ? Progress towards OT goals: Progressing toward goals ? ?Acute Rehab OT Goals ?Patient Stated Goal: none stated ?OT Goal Formulation: With patient/family ?Time For Goal Achievement: 11/30/21 ?Potential to Achieve Goals: Good ?ADL Goals ?Pt Will Perform Upper Body Dressing: with set-up;with supervision;sitting ?Pt Will Perform Lower Body Dressing: with min guard assist;sit to/from stand ?Pt Will Transfer to Toilet: with min assist;ambulating;bedside commode ?Additional ADL Goal #1: Pt will follow two steps commands during ADLs with Min cues  ?Plan Discharge plan remains appropriate;Frequency remains appropriate   ? ?Co-evaluation ? ? ?   ?  ?  ?  ?  ? ?  ?AM-PAC OT "6 Clicks" Daily Activity     ?Outcome Measure ? ? Help from another person eating meals?: A Little ?Help from another person taking care of personal grooming?: A Little ?Help from another person toileting, which includes using toliet, bedpan, or urinal?: A Lot ?Help from another  person bathing (including washing, rinsing, drying)?: A Lot ?Help from another person to put on and taking off regular upper body clothing?: A Lot ?Help from another person to put on and taking off regular lower body clothing?: A Lot ?6 Click Score: 14 ? ?  ?End of Session Equipment Utilized During Treatment: Oxygen;Gait belt ? ?OT Visit Diagnosis: Unsteadiness on feet (R26.81);Other abnormalities of gait and mobility (R26.89);Muscle weakness (generalized) (M62.81) ?  ?Activity Tolerance Patient tolerated treatment well ?  ?Patient Left in chair;with call bell/phone within reach;with chair alarm set;with nursing/sitter in room ?  ?Nurse Communication Mobility status ?  ? ?   ? ?Time: 5400-8676 ?OT Time Calculation (min): 32 min ? ?Charges: OT General Charges ?$OT Visit: 1 Visit ?OT Treatments ?$Self Care/Home Management : 23-37 mins ? ?Lynnda Child, OTD, OTR/L ?Acute Rehab ?[phone] ? ? ?Kaylyn Lim ?11/20/2021, 12:40 PM ?

## 2021-11-21 DIAGNOSIS — C3491 Malignant neoplasm of unspecified part of right bronchus or lung: Secondary | ICD-10-CM

## 2021-11-21 DIAGNOSIS — E039 Hypothyroidism, unspecified: Secondary | ICD-10-CM

## 2021-11-21 DIAGNOSIS — G9341 Metabolic encephalopathy: Secondary | ICD-10-CM | POA: Diagnosis not present

## 2021-11-21 DIAGNOSIS — J9621 Acute and chronic respiratory failure with hypoxia: Secondary | ICD-10-CM | POA: Diagnosis not present

## 2021-11-21 DIAGNOSIS — I611 Nontraumatic intracerebral hemorrhage in hemisphere, cortical: Secondary | ICD-10-CM | POA: Diagnosis not present

## 2021-11-21 DIAGNOSIS — F05 Delirium due to known physiological condition: Secondary | ICD-10-CM

## 2021-11-21 LAB — MAGNESIUM: Magnesium: 2 mg/dL (ref 1.7–2.4)

## 2021-11-21 LAB — GLUCOSE, CAPILLARY
Glucose-Capillary: 114 mg/dL — ABNORMAL HIGH (ref 70–99)
Glucose-Capillary: 131 mg/dL — ABNORMAL HIGH (ref 70–99)
Glucose-Capillary: 147 mg/dL — ABNORMAL HIGH (ref 70–99)
Glucose-Capillary: 163 mg/dL — ABNORMAL HIGH (ref 70–99)
Glucose-Capillary: 174 mg/dL — ABNORMAL HIGH (ref 70–99)
Glucose-Capillary: 220 mg/dL — ABNORMAL HIGH (ref 70–99)

## 2021-11-21 LAB — CBC
HCT: 33.3 % — ABNORMAL LOW (ref 36.0–46.0)
Hemoglobin: 10.8 g/dL — ABNORMAL LOW (ref 12.0–15.0)
MCH: 29.3 pg (ref 26.0–34.0)
MCHC: 32.4 g/dL (ref 30.0–36.0)
MCV: 90.2 fL (ref 80.0–100.0)
Platelets: 257 10*3/uL (ref 150–400)
RBC: 3.69 MIL/uL — ABNORMAL LOW (ref 3.87–5.11)
RDW: 15 % (ref 11.5–15.5)
WBC: 7.3 10*3/uL (ref 4.0–10.5)
nRBC: 0 % (ref 0.0–0.2)

## 2021-11-21 LAB — BASIC METABOLIC PANEL
Anion gap: 8 (ref 5–15)
BUN: 16 mg/dL (ref 8–23)
CO2: 34 mmol/L — ABNORMAL HIGH (ref 22–32)
Calcium: 9.1 mg/dL (ref 8.9–10.3)
Chloride: 100 mmol/L (ref 98–111)
Creatinine, Ser: 0.58 mg/dL (ref 0.44–1.00)
GFR, Estimated: >60 mL/min (ref >60)
Glucose, Bld: 191 mg/dL — ABNORMAL HIGH (ref 70–99)
Potassium: 3.8 mmol/L (ref 3.5–5.1)
Sodium: 142 mmol/L (ref 135–145)

## 2021-11-21 NOTE — Progress Notes (Signed)
Speech Language Pathology Treatment: Dysphagia;Cognitive-Linquistic  ?Patient Details ?Name: Kendra Watts ?MRN: [id number] ?DOB: 1946-01-15 ?Today's Date: 11/21/2021 ?Time: 3295-1884 ?SLP Time Calculation (min) (ACUTE ONLY): 23 min ? ?Assessment / Plan / Recommendation ?Clinical Impression ? Pt seen for diet tolerance with trials of HTL, NTL and regular textured solids. Pt self-fed cup sips of HTL and bites of regular textures which were without s/sx of aspiration. Sips of NTL continue to be significant for intermittent throat clearing, although some throat clearing prior to POs was also noted. Pt responsive to min verbal cues for small bites/sips at slow rate, performing with 100% accuracy. Recommend continue current diet with SLP to f/u for readiness for diet advancement/instrumental assessment.  ? ?Post PO trials and instruction in basic swallow precautions, pt unable to recall any of this information. She was able to identify month and year given mod verbal cues to utilize information board in room as external memory aid. Day of week also incorrect, but pt demonstrated improved accuracy with use of phone which displays full date on front screen. She will continue to benefit from skilled SLP services to address cognitive functions.  ?  ?HPI HPI: 76 year old woman who [facility] ED 4/24 via EMS for Code Stroke due to AMS, seizure activity; she was down minutes at most prior to being discovered.  Tonic-clonic seizure-like activity was noted en route with EMS.  ETT 4/24-4/28, intubated in ED.  MRI 4/25: "Redemonstrated acute/recent hemorrhage in the left occipital lobe." Infarct felt not to be cause of her symptoms. PMHx significant for HTN, HLD, CHF (Echo 04/2020 LVEF 60-65%,), COPD (on 3L home O2), RAS with nonfunctioning L kidney, breast CA (s/p radiation 2017-2020), lung adenocarcinoma. ?  ?   ?SLP Plan ? Continue with current plan of care ? ?  ?  ?Recommendations for follow up therapy are one component of  a multi-disciplinary discharge planning process, led by the attending physician.  Recommendations may be updated based on patient status, additional functional criteria and insurance authorization. ?  ? ?Recommendations  ?Diet recommendations: Regular;Honey-thick liquid ?Liquids provided via: Cup;No straw ?Medication Administration: Whole meds with puree ?Supervision: Staff to assist with self feeding;Full supervision/cueing for compensatory strategies ?Compensations: Slow rate;Small sips/bites ?Postural Changes and/or Swallow Maneuvers: Seated upright 90 degrees  ?   ?    ?   ? ? ? ? Oral Care Recommendations: Oral care BID ?Follow Up Recommendations: Acute inpatient rehab (3hours/day) ?Assistance recommended at discharge: Frequent or constant Supervision/Assistance ?SLP Visit Diagnosis: Cognitive communication deficit (R41.841);Dysphagia, pharyngeal phase (R13.13) ?Plan: Continue with current plan of care ? ? ? ? ?  ?  ? ? ? ?Ellwood Dense, MA, CCC-SLP ?Acute Rehabilitation Services ?Office Number: [phone] ? ?Kendra Watts ? ?11/21/2021, 12:52 PM ?

## 2021-11-21 NOTE — Assessment & Plan Note (Signed)
TSH low ?- Continue levothyroxine ?- Check fT4, T3 ?

## 2021-11-21 NOTE — Progress Notes (Signed)
Nutrition Follow-up ? ?DOCUMENTATION CODES:  ?Severe malnutrition in context of chronic illness, Underweight ? ?INTERVENTION:  ?Continue current diet as ordered per SLP ?Magic cup TID with meals, each supplement provides 290 kcal and 9 grams of protein ? ?NUTRITION DIAGNOSIS:  ?Severe Malnutrition related to chronic illness (cancer) as evidenced by severe fat depletion, severe muscle depletion. ?- Ongoing ? ?GOAL:  ?Patient will meet greater than or equal to 90% of their needs ?- progressing diet in place, pt reports good intake ? ?MONITOR:  ?PO intake, Supplement acceptance ? ?REASON FOR ASSESSMENT:  ?Consult ?Enteral/tube feeding initiation and management ? 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 ? ?4/24 - admit to Neuro ICU, intubated ?4/28 - Extubated, FEES: Regular with honey thick  ?4/29 - moved out of ICU ? ?Pt resting in bed at the time of assessment. Reports that she thinks she has been eating well since diet was advanced. Estimates that she is consuming about 50% of her plate. RN confirms this. States that someone is assisting her with ordering her meals and selecting foods that she likes. Denies any trouble with chewing or swallowing her food at this time. Noted that Magic cups were not on meal plan, added today and pt excited to start receiving.   ? ?Average Meal Intake: ?4/29: 25% intake x 1 recorded meals ? ?Nutritionally Relevant Medications: ?Scheduled Meds: ? doxazosin  8 mg Oral Daily  ? insulin aspart  0-6 Units Subcutaneous Q4H  ? levETIRAcetam  500 mg Oral BID  ? levothyroxine  88 mcg Oral QAC breakfast  ? multivitamin with minerals  1 tablet Oral Daily  ? pantoprazole  40 mg Oral Daily  ? predniSONE  40 mg Oral QAC breakfast  ? rosuvastatin  10 mg Oral QHS  ?  senna-docusate  1 tablet Oral BID  ? thiamine  100 mg Oral Daily  ? ?Labs Reviewed ? ?NUTRITION - FOCUSED PHYSICAL EXAM: ?Flowsheet Row Most Recent Value  ?Orbital Region Severe depletion  ?Upper Arm Region Severe depletion  ?Thoracic and Lumbar Region Severe depletion  ?Buccal Region Severe depletion  ?Temple Region Moderate depletion  ?Clavicle Bone Region Severe depletion  ?Clavicle and Acromion Bone Region Severe depletion  ?Scapular Bone Region Severe depletion  ?Dorsal Hand Severe depletion  ?Patellar Region Severe depletion  ?Anterior Thigh Region Severe depletion  ?Posterior Calf Region Severe depletion  ?Edema (RD Assessment) None  ?Hair Reviewed  ?Eyes Unable to assess  ?Mouth Unable to assess  ?Skin Reviewed  ?Nails Reviewed  ? ?Diet Order:   ?Diet Order   ? ?       ?  Diet regular Room service appropriate? Yes with Assist; Fluid consistency: Honey Thick  Diet effective now       ?  ? ?  ?  ? ?  ? ? ?EDUCATION NEEDS:  ?Not appropriate for education at this time ? ?Skin:  Skin Assessment: Reviewed RN Assessment ? ?Last BM:  5/2 - type 6 ? ?Height:  ?Ht Readings from Last 1 Encounters:  ?11/12/21 5' (1.524 m)  ? ? ?Weight:  ?Wt Readings from Last 1 Encounters:  ?11/20/21 42.9 kg  ? ? ?Ideal Body Weight:  45.5 kg ? ?BMI:  Body mass index is 18.47 kg/m?. ? ?Estimated Nutritional Needs:  ?Kcal:  1200-1400 ?  Protein:  60-75 grams ?Fluid:  >1.5 L/day ? ? ? ?Ranell Patrick, RD, LDN ?Clinical Dietitian ?RD pager # available in [place]  ?After hours/weekend pager # available in [place] ?

## 2021-11-21 NOTE — Progress Notes (Signed)
?  Progress Note ? ? ?Patient: Kendra Watts JAS:[id number] DOB: Aug 12, 1945 DOA: 11/12/2021     9 ?DOS: the patient was seen and examined on 11/21/2021 at 10:29AM ?  ? ? ? ?Brief hospital course: ?Mrs. Leyva is a 76 y.o. F with chronic heart failure, chronic respiratory failure, chronic kidney disease and history breast and lung cancer who presented with confusion, being found down, seizure. ? ?Intubated and loaded with Keppra in the ER.  Otherwise, see prior summary. ? ? ? ? ?Assessment and Plan: ?* Intracerebral hemorrhage ?Admitted and neuraxial imaging showed small intraparenchymal hemorrhage in the left occipital lobe.  Neurology suspect this was hemorrhagic transformation of a stroke.  ? ?-Non-invasive angiography showed <70% stenosis of bilateral common carotids ?-Echocardiogram showed no cardiogenic source of embolism ?-Lipids ordered: LDL 58, continue Crestor ?-Aspirin deferred given bleeding ?-Atrial fibrillation: not present on telemetry monitoring, recommend monitor at discharge ?-tPA not given because active bleeding ?-Dysphagia screen ordered in ER ?-PT eval ordered: recommended CIR ?-Smoking cessation: recommended, nursing teaching ordered ? ? ? ?Status epilepticus ([place]) ?- Continue Keppra ?- Follow up with Neurology in 4-6 weeks ? ?Acute on chronic respiratory failure with hypoxia (HCC) ?On 3L at home chronically, in the ER, required intubation due to respiratory failure due to seizures.  ? ?Now back to baseline ? ?COPD with acute exacerbation ([place]) ?- Continue ICS/LABA ?- Continue prednisone ? ?Hypothyroidism, unspecified ?TSH low ?- Continue levothyroxine ?- Check fT4, T3 ? ?Acute metabolic encephalopathy ?On admission was confused due to strokes.   ? ?Now resolved to baseline without cognitive impairment. ? ?Adenocarcinoma, lung, right ([place]) ?  ? ?Anemia ?Hgb stable, no clinical beleding ? ?Breast cancer of upper-outer quadrant of left female breast [facility]) ?  ? ?Hyperlipidemia ?- Continue  Crestor ? ?Essential hypertension ?BP normal ?- Continue amlodipine, metoprolol, doxazosin ? ?Peripheral arterial disease ([place]) ?- Continue Crestor ? ? ? ? ? ? ? ? ? ?Subjective: Patient feels like her breathing is at baseline.  She has no left-sided weakness.  She has no confusion, speech deficits.  No vomiting or fever. ? ? ? ? ?Physical Exam: ?Vitals:  ? 11/21/21 0406 11/21/21 0742 11/21/21 0807 11/21/21 1254  ?BP: 140/82  (!) 141/81 112/68  ?Pulse: 76  79 83  ?Resp: 14  18 18   ?Temp: 98.2 ?F (36.8 ?C)   97.8 ?F (36.6 ?C)  ?TempSrc: Oral   Oral  ?SpO2: 100% 94% 98% 97%  ?Weight:      ?Height:      ? ?Thin adult female, lying in bed, interactive, appears tired ?RRR, no murmurs, no peripheral edema ?Lung sounds diminished, scant wheezes bilaterally, normal air movement ?Abdomen soft no tenderness palpation or guarding, no ascites or distention ?Attention normal, affect normal, judgment insight appear normal ?Face symmetric, speech fluent, moves all extremities generalized weakness, maybe some mild left upper extremity weakness ? ?Data Reviewed: ?Neurology notes reviewed, vital signs reviewed, nursing notes reviewed ?Labs notable for basic metabolic panel with normal electrolytes and renal function ?Complete blood count notable for stable anemia ?Glucose normal ? ?Family Communication:   ? ? ? ?Disposition: ?Status is: Inpatient ?Patient was admitted for intraparenchymal hemorrhage, likely hemorrhagic transformation of stroke. ? ?She is medically stable for discharge when bed is available ? ? ? ? ? ? ? ?Author: ?Edwin Dada, MD ?11/21/2021 2:01 PM ? ?For on call review [email or web address].  ? ? ?

## 2021-11-21 NOTE — TOC Progression Note (Signed)
Transition of Care (TOC) - Progression Note  ? ? ?Patient Details  ?Name: Kendra Watts ?MRN: [id number] ?Date of Birth: [birth year] ? ?Transition of Care (TOC) CM/SW Contact  ?Geralynn Ochs, LCSW ?Phone Number: ?11/21/2021, 4:13 PM ? ?Clinical Narrative:   CSW spoke with nephew who was asking for assistance with an incompetency letter to send to the lawyer so that he can utilize his POA to assist patient with managing her affairs. CSW coordinated with MD and neurologist to obtain information for letter for the nephew and MD signed. CSW sent letter to nephew per request. CSW also spoke with nephew about bed offers for SNF, and sent to nephew in the email as well. Nephew to review and will determine choice. CSW to follow. ? ? ? ?Expected Discharge Plan: [place] ?Barriers to Discharge: SNF Pending bed offer ? ?Expected Discharge Plan and Services ?Expected Discharge Plan: [place] ?  ?  ?Post Acute Care Choice: [place] ?Living arrangements for the past 2 months: [place] ?                ?  ?  ?  ?  ?  ?  ?  ?  ?  ?  ? ? ?Social Determinants of Health (SDOH) Interventions ?  ? ?Readmission Risk Interventions ? ?  03/16/2020  ?  2:24 PM  ?Readmission Risk Prevention Plan  ?Transportation Screening Complete  ?PCP or Specialist Appt within 3-5 Days Complete  ?[place] or Home Care Consult Complete  ?Social Work Consult for [place] Planning/Counseling Complete  ?Palliative Care Screening Not Applicable  ?Medication Review Press photographer) Complete  ? ? ?

## 2021-11-21 NOTE — Assessment & Plan Note (Addendum)
Due to intraparenchymal hemorrhage.  Intubated and loaded with Keppra in the ER, subsequent EEG negative and no further seizure-like activity. ?- Continue Keppra ?- Follow up with Neurology in 4-6 weeks ?

## 2021-11-21 NOTE — Assessment & Plan Note (Addendum)
BP normal ?- Continue amlodipine, metoprolol, doxazosin ?

## 2021-11-21 NOTE — Care Plan (Signed)
Called to sister and nephew, no answer. ?

## 2021-11-21 NOTE — Assessment & Plan Note (Addendum)
Prednisone started this week due to some increased respiratory effort post-extubation. ? ?In last 48 hours, no wheezing, feels breathing is at baseline. ?- Continue home ICS/LABA ?- Stop prednisone ?

## 2021-11-21 NOTE — Assessment & Plan Note (Addendum)
Admitted and neuraxial imaging showed small intraparenchymal hemorrhage in the left occipital lobe.  Neurology suspect this was hemorrhagic transformation of a stroke.  ? ?-Non-invasive angiography showed <70% stenosis of bilateral common carotids ?-Echocardiogram showed no cardiogenic source of embolism ?-Lipids ordered: LDL 58, continue Crestor ?-Aspirin deferred given bleeding ?-Atrial fibrillation: not present on telemetry monitoring, recommend monitor at discharge ?-tPA not given because active bleeding ?-Dysphagia screen ordered in ER ?-PT eval ordered: recommended CIR ?-Smoking cessation: recommended, nursing teaching ordered ? ? ?

## 2021-11-21 NOTE — Hospital Course (Addendum)
Kendra Watts is a 76 y.o. F with chronic heart failure, chronic respiratory failure, chronic kidney disease and history breast and lung cancer who presented with confusion, being found down, seizure. ? ?Evidently found down by a friend.  Developed tonic-clonic seizure en route with EMS.  Intubated and loaded with Keppra in the ER.  Noted extreme elevation in BP and started on Cleviprex.   CTH showed left occipital IPH. ?

## 2021-11-21 NOTE — Assessment & Plan Note (Signed)
Continue Crestor 

## 2021-11-21 NOTE — Assessment & Plan Note (Signed)
On 3L at home chronically, in the ER, required intubation due to respiratory failure due to seizures.  ? ?Now back to baseline ?

## 2021-11-21 NOTE — TOC Progression Note (Signed)
Transition of Care (TOC) - Progression Note  ? ? ?Patient Details  ?Name: HARLENE PETRALIA ?MRN: [id number] ?Date of Birth: September 29, 1945 ? ?Transition of Care (TOC) CM/SW Contact  ?Geralynn Ochs, LCSW ?Phone Number: ?11/21/2021, 4:14 PM ? ?Clinical Narrative:   CSW spoke with nephew to ask about SNF offers, and nephew had not looked at list yet. CSW informed nephew that patient was ready for rehab and he needed to choose where to send the patient. Nephew said he would review today, and also asked for CSW to contact patient's sister, Lelan Pons, to also provide list so that they could work together to make a choice. CSW contacted Lelan Pons via phone and sent a text with bed offers. CSW to follow. ? ? ? ?Expected Discharge Plan: [place] ?Barriers to Discharge: SNF Pending bed offer ? ?Expected Discharge Plan and Services ?Expected Discharge Plan: [place] ?  ?  ?Post Acute Care Choice: [place] ?Living arrangements for the past 2 months: [place] ?                ?  ?  ?  ?  ?  ?  ?  ?  ?  ?  ? ? ?Social Determinants of Health (SDOH) Interventions ?  ? ?Readmission Risk Interventions ? ?  03/16/2020  ?  2:24 PM  ?Readmission Risk Prevention Plan  ?Transportation Screening Complete  ?PCP or Specialist Appt within 3-5 Days Complete  ?[place] or Home Care Consult Complete  ?Social Work Consult for [place] Planning/Counseling Complete  ?Palliative Care Screening Not Applicable  ?Medication Review Press photographer) Complete  ? ? ?

## 2021-11-21 NOTE — Assessment & Plan Note (Signed)
Hgb stable, no clinical beleding ?

## 2021-11-21 NOTE — Assessment & Plan Note (Addendum)
On admission was confused due to intraparenchymal hemorrhage, status epilepticus, postictal state.   ? ?Now resolved to baseline without cognitive impairment. ?

## 2021-11-22 ENCOUNTER — Inpatient Hospital Stay (HOSPITAL_COMMUNITY): Payer: Medicare Other

## 2021-11-22 DIAGNOSIS — J9621 Acute and chronic respiratory failure with hypoxia: Secondary | ICD-10-CM | POA: Diagnosis not present

## 2021-11-22 DIAGNOSIS — C3491 Malignant neoplasm of unspecified part of right bronchus or lung: Secondary | ICD-10-CM | POA: Diagnosis not present

## 2021-11-22 DIAGNOSIS — G9341 Metabolic encephalopathy: Secondary | ICD-10-CM | POA: Diagnosis not present

## 2021-11-22 DIAGNOSIS — N3000 Acute cystitis without hematuria: Secondary | ICD-10-CM | POA: Insufficient documentation

## 2021-11-22 DIAGNOSIS — I611 Nontraumatic intracerebral hemorrhage in hemisphere, cortical: Secondary | ICD-10-CM | POA: Diagnosis not present

## 2021-11-22 LAB — GLUCOSE, CAPILLARY
Glucose-Capillary: 107 mg/dL — ABNORMAL HIGH (ref 70–99)
Glucose-Capillary: 106 mg/dL — ABNORMAL HIGH (ref 70–99)
Glucose-Capillary: 163 mg/dL — ABNORMAL HIGH (ref 70–99)
Glucose-Capillary: 138 mg/dL — ABNORMAL HIGH (ref 70–99)
Glucose-Capillary: 186 mg/dL — ABNORMAL HIGH (ref 70–99)

## 2021-11-22 LAB — URINALYSIS, ROUTINE W REFLEX MICROSCOPIC
Bilirubin Urine: NEGATIVE
Glucose, UA: NEGATIVE mg/dL
Ketones, ur: NEGATIVE mg/dL
Nitrite: NEGATIVE
Protein, ur: 30 mg/dL — AB
Specific Gravity, Urine: 1.013 (ref 1.005–1.030)
pH: 8 (ref 5.0–8.0)

## 2021-11-22 LAB — T4, FREE: Free T4: 1 ng/dL (ref 0.61–1.12)

## 2021-11-22 MED ORDER — FOSFOMYCIN TROMETHAMINE 3 G PO PACK
3.0000 g | PACK | Freq: Once | ORAL | Status: AC
Start: 2021-11-22 — End: 2021-11-22
  Administered 2021-11-22: 3 g via ORAL
  Filled 2021-11-22: qty 3

## 2021-11-22 NOTE — Progress Notes (Signed)
Physical Therapy Treatment ?Patient Details ?Name: Kendra Watts ?MRN: [id number] ?DOB: 1945-09-22 ?Today's Date: 11/22/2021 ? ? ?History of Present Illness 76 y.o. female who presents to [facility] hospital 11/12/2021 after being found down with seizure like activity and AMS. CT head showed L occipital parenchymal hemorrhage.  Pt intubated on arrival to ED, extubated 4/28. PMH: breast CA, COPD on chronic home 4L O2, CHF, HTN, L hip fx. ? ?  ?PT Comments  ? ? Pt improving steadily.  Still getting incontinent of urine or stool in today's case.  Used transfer to the [facility] and pericare to work on safety with transfers and standing balance. Progressed to gait training for stability and stamina and overall balance with assist needed for moving/directing the RW. ?   ?Recommendations for follow up therapy are one component of a multi-disciplinary discharge planning process, led by the attending physician.  Recommendations may be updated based on patient status, additional functional criteria and insurance authorization. ? ?Follow Up Recommendations ? Acute inpatient rehab (3hours/day) ?  ?  ?Assistance Recommended at Discharge Frequent or constant Supervision/Assistance  ?Patient can return home with the following A little help with walking and/or transfers;A little help with bathing/dressing/bathroom;Assistance with cooking/housework;Assist for transportation;Help with stairs or ramp for entrance ?  ?Equipment Recommendations ? BSC/3in1  ?  ?Recommendations for Other Services   ? ? ?  ?Precautions / Restrictions Precautions ?Precautions: Fall  ?  ? ?Mobility ? Bed Mobility ?Overal bed mobility: Needs Assistance ?Bed Mobility: Supine to Sit ?  ?  ?Supine to sit: Min guard, HOB elevated ?  ?  ?  ?  ? ?Transfers ?Overall transfer level: Needs assistance ?Equipment used: Rolling walker (2 wheels), None ?Transfers: Sit to/from Stand ?Sit to Stand: Min assist ?  ?  ?  ?  ?  ?General transfer comment: emphasized hand placement for safety and  assisted to come more forward ?  ? ?Ambulation/Gait ?Ambulation/Gait assistance: Min assist ?Gait Distance (Feet): 250 Feet ?Assistive device: Rolling walker (2 wheels) ?Gait Pattern/deviations: Step-through pattern ?  ?  ?  ?General Gait Details: able to stay upright without posterior bias, propelled forward well, but with drift R consistently.  VC in attempt to keep pt off the R wall or from hitting obstacles on the R.  Engaged pt in way finding and scanning task to get back to her room.  Needed cuing , but soon improved with repetitve cuing. ? ? ?Stairs ?  ?  ?  ?  ?  ? ? ?Wheelchair Mobility ?  ? ?Modified Rankin (Stroke Patients Only) ?  ? ? ?  ?Balance Overall balance assessment: Needs assistance ?Sitting-balance support: No upper extremity supported, Feet supported ?Sitting balance-Leahy Scale: Fair ?  ?  ?Standing balance support: Single extremity supported, Bilateral upper extremity supported, During functional activity ?Standing balance-Leahy Scale: Poor ?Standing balance comment: reliant on external support from at least one UE ?  ?  ?  ?  ?  ?  ?  ?  ?  ?  ?  ?  ? ?  ?Cognition Arousal/Alertness: Awake/alert ?Behavior During Therapy: [facility] for tasks assessed/performed ?Overall Cognitive Status: No family/caregiver present to determine baseline cognitive functioning (still confused, but more clear mentally and holding appropriate conversation today.) ?  ?  ?  ?  ?  ?  ?  ?  ?  ?Orientation Level: Disoriented to, Place, Time, Situation ?Current Attention Level: Sustained, Focused ?Memory: Decreased short-term memory ?Following Commands: Follows one step commands consistently, Follows one step  commands with increased time ?Safety/Judgement: Decreased awareness of deficits ?Awareness: Intellectual ?  ?  ?  ?  ? ?  ?Exercises   ? ?  ?General Comments General comments (skin integrity, edema, etc.): overall improved stability in standing during pericare at [facility]. ?  ?  ? ?Pertinent Vitals/Pain Pain Assessment ?Pain  Assessment: Faces ?Faces Pain Scale: No hurt ?Pain Intervention(s): Monitored during session  ? ? ?Home Living   ?  ?  ?  ?  ?  ?  ?  ?  ?  ?   ?  ?Prior Function    ?  ?  ?   ? ?PT Goals (current goals can now be found in the care plan section) Acute Rehab PT Goals ?Patient Stated Goal: to go home ?PT Goal Formulation: With patient ?Time For Goal Achievement: 11/30/21 ?Potential to Achieve Goals: Fair ?Progress towards PT goals: Progressing toward goals ? ?  ?Frequency ? ? ? Min 3X/week ? ? ? ?  ?PT Plan Current plan remains appropriate  ? ? ?Co-evaluation   ?  ?  ?  ?  ? ?  ?AM-PAC PT "6 Clicks" Mobility   ?Outcome Measure ? Help needed turning from your back to your side while in a flat bed without using bedrails?: A Little ?Help needed moving from lying on your back to sitting on the side of a flat bed without using bedrails?: A Little ?Help needed moving to and from a bed to a chair (including a wheelchair)?: A Little ?Help needed standing up from a chair using your arms (e.g., wheelchair or bedside chair)?: A Little ?Help needed to walk in hospital room?: A Little ?Help needed climbing 3-5 steps with a railing? : A Lot ?6 Click Score: 17 ? ?  ?End of Session   ?Activity Tolerance: Patient tolerated treatment well ?Patient left: in bed;with call bell/phone within reach;with bed alarm set ?Nurse Communication: Mobility status ?PT Visit Diagnosis: Other abnormalities of gait and mobility (R26.89);Muscle weakness (generalized) (M62.81);Difficulty in walking, not elsewhere classified (R26.2) ?  ? ? ?Time: 9753-0051 ?PT Time Calculation (min) (ACUTE ONLY): 37 min ? ?Charges:  $Gait Training: 8-22 mins ?$Therapeutic Activity: 8-22 mins          ?          ? ?11/22/2021 ? ?Ginger Carne., PT ?Acute Rehabilitation Services ?[phone]  (pager) ?[phone]  (office) ? ? ?Tessie Fass Kacie Huxtable ?11/22/2021, 5:12 PM ? ?

## 2021-11-22 NOTE — TOC Progression Note (Signed)
Transition of Care (TOC) - Progression Note  ? ? ?Patient Details  ?Name: Kendra Watts ?MRN: [id number] ?Date of Birth: 20-Mar-1946 ? ?Transition of Care (TOC) CM/SW Contact  ?Geralynn Ochs, LCSW ?Phone Number: ?11/22/2021, 3:24 PM ? ?Clinical Narrative:   CSW attempted to reach nephew earlier today, left a voicemail. CSW tried to call again and spoke with nephew about a SNF choice, and he still has not reviewed. CSW stressed about patient being ready for SNF and needing to know where she was going, and nephew said he was leaning towards [facility] but needed time to discuss with patient's sister and he's been so busy with work. Nephew indicated he would speak with the sister and get back to CSW by the end of the day. CSW confirmed with [facility] that there is a bed available whenever a choice is made. CSW to follow. ? ? ? ?Expected Discharge Plan: [place] ?Barriers to Discharge: SNF Pending bed offer ? ?Expected Discharge Plan and Services ?[place] ?  ?  ?Post Acute Care Choice: [place] ?Living arrangements for the past 2 months: [place] ?                ?  ?  ?  ?  ?  ?  ?  ?  ?  ?  ? ? ?Social Determinants of Health (SDOH) Interventions ?  ? ?Readmission Risk Interventions ? ?  03/16/2020  ?  2:24 PM  ?Readmission Risk Prevention Plan  ?Transportation Screening Complete  ?PCP or Specialist Appt within 3-5 Days Complete  ?[place] or Home Care Consult Complete  ?Social Work Consult for [place] Planning/Counseling Complete  ?Palliative Care Screening Not Applicable  ?Medication Review Press photographer) Complete  ? ? ?

## 2021-11-22 NOTE — Progress Notes (Signed)
Occupational Therapy Treatment ?Patient Details ?Name: Kendra Watts ?MRN: [id number] ?DOB: [birth year] ?Today's Date: 11/22/2021 ? ? ?History of present illness 76 y.o. female who presents to [facility] hospital 11/12/2021 after being found down with seizure like activity and AMS. CT head showed L occipital parenchymal hemorrhage.  Pt intubated on arrival to ED, extubated 4/28. PMH: breast CA, COPD on chronic home 4L O2, CHF, HTN, L hip fx. ?  ?OT comments ? Pt making steady progress towards OT goals this session. Pt continues to present with decreased activity tolerance, impaired balance and generalized deconditioning . Session focus increasing activity tolerance, dynamic standing balance, and BADL reeducation. Pt currently requires MIN A for ADL transfers with HHA, min guard for LB ADLS, and MIN A for UB ADLs. Pt would continue to benefit from skilled occupational therapy while admitted and after d/c to address the below listed limitations in order to improve overall functional mobility and facilitate independence with BADL participation. DC plan remains appropriate, will follow acutely per POC.  ? ?  ? ?Recommendations for follow up therapy are one component of a multi-disciplinary discharge planning process, led by the attending physician.  Recommendations may be updated based on patient status, additional functional criteria and insurance authorization. ?   ?Follow Up Recommendations ? Acute inpatient rehab (3hours/day)  ?  ?Assistance Recommended at Discharge Frequent or constant Supervision/Assistance  ?Patient can return home with the following ? A lot of help with walking and/or transfers;A lot of help with bathing/dressing/bathroom ?  ?Equipment Recommendations ? None recommended by OT;Other (comment) (defer to next venue of care)  ?  ?Recommendations for Other Services   ? ?  ?Precautions / Restrictions Precautions ?Precautions: Fall ?Precaution Comments: watch sats, 3-4L ?Restrictions ?Weight Bearing Restrictions:  No  ? ? ?  ? ?Mobility Bed Mobility ?Overal bed mobility: Needs Assistance ?Bed Mobility: Supine to Sit ?  ?  ?Supine to sit: Min guard, HOB elevated ?  ?  ?General bed mobility comments: min guard for safety, + time and effort ?  ? ?Transfers ?Overall transfer level: Needs assistance ?Equipment used: 1 person hand held assist ?Transfers: Sit to/from Stand ?Sit to Stand: Min assist ?Stand pivot transfers: Min assist ?  ?  ?  ?  ?General transfer comment: MIN A to rise from EOB and BSC with HHA, MIN A to pivot needing MOD verbal cues for sequencing body mechanics and overall safety awareness ?  ?  ?Balance Overall balance assessment: Needs assistance ?Sitting-balance support: No upper extremity supported, Feet supported ?Sitting balance-Leahy Scale: Fair ?  ?  ?Standing balance support: Single extremity supported, During functional activity ?Standing balance-Leahy Scale: Poor ?Standing balance comment: reliant on external support from at least one UE ?  ?  ?  ?  ?  ?  ?  ?  ?  ?  ?  ?   ? ?ADL either performed or assessed with clinical judgement  ? ?ADL Overall ADL's : Needs assistance/impaired ?  ?  ?Grooming: Wash/dry hands;Sitting;Set up ?Grooming Details (indicate cue type and reason): from [facility]; set- up of wash cloth ?  ?  ?Lower Body Bathing: Sit to/from stand;Min guard ?Lower Body Bathing Details (indicate cue type and reason): min guard for LBbathing from [facility] after toileting ?Upper Body Dressing : Minimal assistance;Sitting ?Upper Body Dressing Details (indicate cue type and reason): to don gown ?Lower Body Dressing: Maximal assistance;Sit to/from stand ?Lower Body Dressing Details (indicate cue type and reason): d/t incontinent BM ?Toilet Transfer: Minimal assistance;Stand-pivot;BSC/3in1 ?Armed forces technical officer  Details (indicate cue type and reason): MIN A to pivot from EOB<>BSC with no AD; MIN A fro balance via HHA ?Toileting- Clothing Manipulation and Hygiene: Moderate assistance;Sit to/from stand ?Toileting -  Clothing Manipulation Details (indicate cue type and reason): pt able to complete anterior pericare but needed assist for posterior perircare d/t balance deficits ?  ?  ?Functional mobility during ADLs: Minimal assistance;Cueing for safety;Cueing for sequencing ?General ADL Comments: pt continues to present with decreased activity tolerance, impaired balance, and generalized deconditioning ?  ? ?Extremity/Trunk Assessment Upper Extremity Assessment ?Upper Extremity Assessment: Generalized weakness ?  ?Lower Extremity Assessment ?Lower Extremity Assessment: Defer to PT evaluation ?  ?Cervical / Trunk Assessment ?Cervical / Trunk Assessment: Kyphotic ?  ? ?Vision   ?Vision Assessment?: No apparent visual deficits (per gross assessment) ?  ?Perception Perception ?Perception: Within Functional Limits ?  ?Praxis Praxis ?Praxis: Intact ?  ? ?Cognition Arousal/Alertness: Awake/alert ?Behavior During Therapy: [facility] for tasks assessed/performed ?Overall Cognitive Status: No family/caregiver present to determine baseline cognitive functioning ?Area of Impairment: Attention, Memory, Following commands, Safety/judgement, Awareness, Problem solving ?  ?  ?  ?  ?  ?  ?  ?  ?  ?Current Attention Level: Focused ?Memory: Decreased short-term memory ?Following Commands: Follows one step commands consistently, Follows multi-step commands with increased time ?Safety/Judgement: Decreased awareness of safety, Decreased awareness of deficits ?Awareness: Intellectual ?Problem Solving: Slow processing, Decreased initiation, Difficulty sequencing, Requires verbal cues ?General Comments: increased to procress and follow commands, decreased ability to sequence multistep commands, poor awareness into deficits as pt feeling like she won't need assist at home ?  ?  ?   ?Exercises   ? ?  ?Shoulder Instructions   ? ? ?  ?General Comments pt initially on 3,5 L [place] with spo2 initially reading in 80, pt completed several minutes of PLB with no change,  alerted RN who gave pt inhaler and switched wall O2 over to [place] with increase to >90%, pt 92% on 3 L post activity, HR 105 bpm. pt reports she does have family/friends that can assist her after rehab LOS  ? ? ?Pertinent Vitals/ Pain       Pain Assessment ?Pain Assessment: No/denies pain ? ?Home Living   ?  ?  ?  ?  ?  ?  ?  ?  ?  ?  ?  ?  ?  ?  ?  ?  ?  ?  ? ?  ?Prior Functioning/Environment    ?  ?  ?  ?   ? ?Frequency ?    ? ? ? ? ?  ?Progress Toward Goals ? ?OT Goals(current goals can now be found in the care plan section) ? Progress towards OT goals: Progressing toward goals ? ?Acute Rehab OT Goals ?Patient Stated Goal: go see her dog ?OT Goal Formulation: With patient ?Time For Goal Achievement: 11/30/21 ?Potential to Achieve Goals: Good  ?Plan Discharge plan remains appropriate;Frequency remains appropriate   ? ?Co-evaluation ? ? ?   ?  ?  ?  ?  ? ?  ?AM-PAC OT "6 Clicks" Daily Activity     ?Outcome Measure ? ? Help from another person eating meals?: None ?Help from another person taking care of personal grooming?: A Little ?Help from another person toileting, which includes using toliet, bedpan, or urinal?: A Little ?Help from another person bathing (including washing, rinsing, drying)?: A Little ?Help from another person to put on and taking off regular upper body clothing?: None ?  Help from another person to put on and taking off regular lower body clothing?: A Little ?6 Click Score: 20 ? ?  ?End of Session Equipment Utilized During Treatment: Gait belt;Oxygen;Rolling walker (2 wheels);Other (comment) (RW for standing balance, 3-3.5 L [place]) ? ?OT Visit Diagnosis: Unsteadiness on feet (R26.81);Other abnormalities of gait and mobility (R26.89);Muscle weakness (generalized) (M62.81) ?  ?Activity Tolerance Patient tolerated treatment well ?  ?Patient Left in bed;with call bell/phone within reach;with bed alarm set ?  ?Nurse Communication Mobility status;Other (comment) (urine sample in [facility]; pt requesting to  order breakfast) ?  ? ?   ? ?Time: 1224-4975 ?OT Time Calculation (min): 42 min ? ?Charges: OT General Charges ?$OT Visit: 1 Visit ?OT Treatments ?$Self Care/Home Management : 38-52 mins ? ?Corinne Ports K., CO

## 2021-11-22 NOTE — Progress Notes (Signed)
OT Cancellation Note ? ?Patient Details ?Name: Kendra Watts ?MRN: [id number] ?DOB: 16-Nov-1945 ? ? ?Cancelled Treatment:    Reason Eval/Treat Not Completed: Patient at procedure or test/ unavailable;Other (comment) (Out of room for MBS), will check back as time allows for OT session. ?Corinne Ports K., COTA/L ?Acute Rehabilitation Services ?[phone] ? ? ?Precious Haws ?11/22/2021, 8:46 AM ?

## 2021-11-22 NOTE — Assessment & Plan Note (Signed)
Fever overnight.  CXR clear.  UA with bacteriuria and patient reporting some recent hematuria (prior to admission) and recent urinary hesitancy/retention. ?- Fosfomycin x1 ?- Follow urine culture ?

## 2021-11-22 NOTE — Progress Notes (Signed)
?Progress Note ? ? ?Patient: Kendra Watts RCV:[id number] DOB: [birth year] DOA: 11/12/2021     10 ?DOS: the patient was seen and examined on 11/22/2021 at 10:00AM ?  ? ? ? ?Brief hospital course: ?Kendra Watts is a 76 y.o. F with chronic heart failure, chronic respiratory failure, chronic kidney disease and history breast and lung cancer who presented with confusion, being found down, seizure. ? ?Evidently found down by a friend.  Developed tonic-clonic seizure en route with EMS.  Intubated and loaded with Keppra in the ER.  Noted extreme elevation in BP and started on Cleviprex.   CTH showed left occipital IPH. ? ? ? ? ?Assessment and Plan: ?* Intracerebral hemorrhage ?Admitted and neuraxial imaging showed small intraparenchymal hemorrhage in the left occipital lobe.  Neurology suspect this was hemorrhagic transformation of a stroke.  ? ?-Non-invasive angiography showed <70% stenosis of bilateral common carotids ?-Echocardiogram showed no cardiogenic source of embolism ?-Lipids ordered: LDL 58, continue Crestor ?-Aspirin deferred given bleeding ?-Atrial fibrillation: not present on telemetry monitoring, recommend monitor at discharge ?-tPA not given because active bleeding ?-Dysphagia screen ordered in ER ?-PT eval ordered: recommended CIR ?-Smoking cessation: recommended, nursing teaching ordered ? ? ? ?Status epilepticus (Kendra Watts) ?Due to intraparenchymal hemorrhage.  Intubated and loaded with Keppra in the ER, subsequent EEG negative and no further seizure-like activity. ?- Continue Keppra ?- Follow up with Neurology in 4-6 weeks ?  ? ?COPD with acute exacerbation ([name]) ?Prednisone started this week due to some increased respiratory effort post-extubation. ? ?In last 48 hours, no wheezing, feels breathing is at baseline. ?- Continue home ICS/LABA ?- Stop prednisone ? ?Acute cystitis ?Fever overnight.  CXR clear.  UA with bacteriuria and patient reporting some recent hematuria (prior to admission) and recent urinary  hesitancy/retention. ?- Fosfomycin x1 ?- Follow urine culture ? ?Hypothyroidism, unspecified ?TSH low, fT4 normal ?- Continue levothyroxine ?- Follow T3 ?   ? ?Hyperlipidemia ?- Continue Crestor ? ?Essential hypertension ?BP normal ?- Continue amlodipine, metoprolol, doxazosin ? ?Peripheral arterial disease ([name]) ?- Continue Crestor ? ? ? ? ? ? ? ? ? ?Subjective: No headache, chest pain, dyspnea, wheezing, cough.  Mild fever.  Also some urinary irritative symptoms. ? ? ? ? ?Physical Exam: ?Vitals:  ? 11/22/21 0500 11/22/21 0528 11/22/21 0815 11/22/21 1218  ?BP:   123/84 106/73  ?Pulse:   85 91  ?Resp:   18 18  ?Temp:  99.4 ?F (37.4 ?C) 99 ?F (37.2 ?C) 99.1 ?F (37.3 ?C)  ?TempSrc:  Oral Oral Oral  ?SpO2:   98% 97%  ?Weight: 39.8 kg     ?Height:      ? ?Small stature, frail elderly female, sitting up in bed, nasal cannula in place, interactive. ?RRR, no murmurs, no peripheral edema ?Respiratory rate normal, lungs with some coarse breath sounds in the bases but no wheezing, no rales, respiratory effort normal ?Good air movement ?Abdomen soft no tenderness palpation or guarding ?Tension normal, affect blunted, slight psychomotor slowing noted, mild cognitive impairment due to possibly dementia versus resolving delirium.  Moves upper extremities with generalized weakness but symmetric strength, face symmetric, speech fluent. ? ? ? ? ? ?Data Reviewed: ?Nursing notes reviewed.  Vital signs reviewed Free T4 normal, glucose normal. ? ?Family Communication:  ? ? ?Disposition: ?Status is: Inpatient ?Patient presented with status epilepticus due to intraparenchymal hemorrhage. ? ?Her medical condition is stabilized and she is medically ready for discharge to SNF when a bed is available ? ? ? ? ? ? ? ?  Author: ?Edwin Dada, MD ?11/22/2021 12:50 PM ? ?For on call review [email or web address].  ? ? ?

## 2021-11-22 NOTE — Plan of Care (Signed)
?  Problem: Education: ?Goal: Knowledge of disease or condition will improve ?Outcome: Progressing ?Goal: Knowledge of secondary prevention will improve (SELECT ALL) ?Outcome: Progressing ?Goal: Knowledge of patient specific risk factors will improve (INDIVIDUALIZE FOR PATIENT) ?Outcome: Progressing ?  ?Problem: Health Behavior/Discharge Planning: ?Goal: Ability to manage health-related needs will improve ?Outcome: Progressing ?  ?Problem: Nutrition: ?Goal: Risk of aspiration will decrease ?Outcome: Progressing ?  ?Problem: Intracerebral Hemorrhage Tissue Perfusion: ?Goal: Complications of Intracerebral Hemorrhage will be minimized ?Outcome: Progressing ?  ?Problem: Education: ?Goal: Knowledge of General Education information will improve ?Description: Including pain rating scale, medication(s)/side effects and non-pharmacologic comfort measures ?Outcome: Progressing ?  ?Problem: Clinical Measurements: ?Goal: Ability to maintain clinical measurements within normal limits will improve ?Outcome: Progressing ?Goal: Will remain free from infection ?Outcome: Progressing ?Goal: Diagnostic test results will improve ?Outcome: Progressing ?Goal: Respiratory complications will improve ?Outcome: Progressing ?Goal: Cardiovascular complication will be avoided ?Outcome: Progressing ?  ?Problem: Elimination: ?Goal: Will not experience complications related to bowel motility ?Outcome: Progressing ?Goal: Will not experience complications related to urinary retention ?Outcome: Progressing ?  ?

## 2021-11-23 ENCOUNTER — Other Ambulatory Visit: Payer: Self-pay | Admitting: Cardiology

## 2021-11-23 DIAGNOSIS — N3001 Acute cystitis with hematuria: Secondary | ICD-10-CM

## 2021-11-23 DIAGNOSIS — I611 Nontraumatic intracerebral hemorrhage in hemisphere, cortical: Secondary | ICD-10-CM

## 2021-11-23 DIAGNOSIS — G9341 Metabolic encephalopathy: Secondary | ICD-10-CM | POA: Diagnosis not present

## 2021-11-23 DIAGNOSIS — I4891 Unspecified atrial fibrillation: Secondary | ICD-10-CM

## 2021-11-23 DIAGNOSIS — J9621 Acute and chronic respiratory failure with hypoxia: Secondary | ICD-10-CM | POA: Diagnosis not present

## 2021-11-23 LAB — GLUCOSE, CAPILLARY
Glucose-Capillary: 101 mg/dL — ABNORMAL HIGH (ref 70–99)
Glucose-Capillary: 104 mg/dL — ABNORMAL HIGH (ref 70–99)
Glucose-Capillary: 156 mg/dL — ABNORMAL HIGH (ref 70–99)
Glucose-Capillary: 173 mg/dL — ABNORMAL HIGH (ref 70–99)
Glucose-Capillary: 174 mg/dL — ABNORMAL HIGH (ref 70–99)
Glucose-Capillary: 311 mg/dL — ABNORMAL HIGH (ref 70–99)
Glucose-Capillary: 96 mg/dL (ref 70–99)

## 2021-11-23 LAB — URINE CULTURE: Culture: <10000 — AB

## 2021-11-23 LAB — RENAL FUNCTION PANEL
Albumin: 2.8 g/dL — ABNORMAL LOW (ref 3.5–5.0)
Anion gap: 7 (ref 5–15)
BUN: 15 mg/dL (ref 8–23)
CO2: 32 mmol/L (ref 22–32)
Calcium: 8.6 mg/dL — ABNORMAL LOW (ref 8.9–10.3)
Chloride: 101 mmol/L (ref 98–111)
Creatinine, Ser: 0.63 mg/dL (ref 0.44–1.00)
GFR, Estimated: >60 mL/min (ref >60)
Glucose, Bld: 93 mg/dL (ref 70–99)
Phosphorus: 3.2 mg/dL (ref 2.5–4.6)
Potassium: 3.4 mmol/L — ABNORMAL LOW (ref 3.5–5.1)
Sodium: 140 mmol/L (ref 135–145)

## 2021-11-23 LAB — T3: T3, Total: 29 ng/dL — ABNORMAL LOW (ref 71–180)

## 2021-11-23 LAB — MAGNESIUM: Magnesium: 1.9 mg/dL (ref 1.7–2.4)

## 2021-11-23 MED ORDER — LEVETIRACETAM 500 MG PO TABS: 500.0000 mg | ORAL_TABLET | Freq: Two times a day (BID) | ORAL | Status: DC

## 2021-11-23 MED ORDER — AMLODIPINE BESYLATE 10 MG PO TABS: 10.0000 mg | ORAL_TABLET | Freq: Every day | ORAL | Status: DC

## 2021-11-23 MED ORDER — POTASSIUM CHLORIDE 20 MEQ PO PACK
60.0000 meq | PACK | Freq: Once | ORAL | Status: AC
  Administered 2021-11-23: 60 meq via ORAL
  Filled 2021-11-23: qty 3

## 2021-11-23 NOTE — Progress Notes (Signed)
Speech Language Pathology Treatment: Dysphagia  ?Patient Details ?Name: DELIA SLATTEN ?MRN: [id number] ?DOB: October 20, 1945 ?Today's Date: 11/23/2021 ?Time: 4503-8882 ?SLP Time Calculation (min) (ACUTE ONLY): 18 min ? ?Assessment / Plan / Recommendation ?Clinical Impression ? Pt with slight hoarse voice, which she says has improved over the past few days. She self-fed trials of NTL via cup x5 without immediate s/sx of aspiration. Regular textures were also consumed without difficulty or signs of aspiration. Delayed throat clearing noted post intake. Pt's clinical presentation of swallow function at bedside has improved and would recommend a repeat instrumental assessment (Modified Barium Swallow Study) to determine current swallow physiology and LRD. Being that pt with probable discharge today, this will need to be completed likely at next level of care. Will continue to follow acutely. ?  ?HPI HPI: 76 year old woman who presented to [facility] ED 4/24 via EMS for Code Stroke due to AMS, seizure activity; she was down minutes at most prior to being discovered.  Tonic-clonic seizure-like activity was noted en route with EMS.  ETT 4/24-4/28, intubated in ED.  MRI 4/25: "Redemonstrated acute/recent hemorrhage in the left occipital lobe." Infarct felt not to be cause of her symptoms. PMHx significant for HTN, HLD, CHF (Echo 04/2020 LVEF 60-65%,), COPD (on 3L home O2), RAS with nonfunctioning L kidney, breast CA (s/p radiation 2017-2020), lung adenocarcinoma. ?  ?   ?SLP Plan ? Continue with current plan of care ? ?  ?  ?Recommendations for follow up therapy are one component of a multi-disciplinary discharge planning process, led by the attending physician.  Recommendations may be updated based on patient status, additional functional criteria and insurance authorization. ?  ? ?Recommendations  ?Diet recommendations: Regular;Honey-thick liquid ?Liquids provided via: Cup;No straw ?Medication Administration: Whole meds with  puree ?Supervision: Staff to assist with self feeding;Full supervision/cueing for compensatory strategies ?Compensations: Slow rate;Small sips/bites ?Postural Changes and/or Swallow Maneuvers: Seated upright 90 degrees  ?   ?    ?   ? ? ? ? Oral Care Recommendations: Oral care BID ?Follow Up Recommendations: Acute inpatient rehab (3hours/day) ?Assistance recommended at discharge: Frequent or constant Supervision/Assistance ?SLP Visit Diagnosis: Dysphagia, pharyngeal phase (R13.13) ?Plan: Continue with current plan of care ? ? ? ? ?  ?  ? ?Ellwood Dense, MA, CCC-SLP ?Acute Rehabilitation Services ?Office Number: [phone] ? ?Acie Fredrickson ? ?11/23/2021, 1:08 PM ?

## 2021-11-23 NOTE — TOC Progression Note (Signed)
Transition of Care (TOC) - Progression Note  ? ? ?Patient Details  ?Name: CARLISSA PESOLA ?MRN: [id number] ?Date of Birth: February 07, 1946 ? ?Transition of Care (TOC) CM/SW Contact  ?Geralynn Ochs, LCSW ?Phone Number: ?11/23/2021, 4:03 PM ? ?Clinical Narrative:   CSW called nephew Yvone Neu this morning to confirm plan to admit to [facility]. CSW updated Yvone Neu that he would have to complete paperwork with [facility], to be on the lookout for their call. CSW updated [facility] and sent discharge paperwork. CSW later received a call from nephew, very agitated that he was unable to complete the paperwork because of his job and he felt rushed. CSW explained to nephew that the paperwork was the only barrier for discharge, and that patient may end up with a bill for being at the hospital after discharge if he doesn't complete it. Nephew became very agitated and argumentative with CSW, said that he would get it done when he had time. CSW updated MD and RN about barrier to discharge. [facility] may be able to admit the patient over the weekend if nephew completes paperwork, CSW to update weekend CSW to check with [facility] if paperwork has been completed. ? ? ? ?Expected Discharge Plan: [place] ?Barriers to Discharge: Family Issues ? ?Expected Discharge Plan and Services ?Expected Discharge Plan: [place] ?  ?  ?Post Acute Care Choice: [place] ?Living arrangements for the past 2 months: [place] ?Expected Discharge Date: 11/23/21               ?  ?  ?  ?  ?  ?  ?  ?  ?  ?  ? ? ?Social Determinants of Health (SDOH) Interventions ?  ? ?Readmission Risk Interventions ? ?  03/16/2020  ?  2:24 PM  ?Readmission Risk Prevention Plan  ?Transportation Screening Complete  ?PCP or Specialist Appt within 3-5 Days Complete  ?[place] or Home Care Consult Complete  ?Social Work Consult for [place] Planning/Counseling Complete  ?Palliative Care Screening Not Applicable  ?Medication  Review Press photographer) Complete  ? ? ?

## 2021-11-23 NOTE — Discharge Summary (Addendum)
?Physician Discharge Summary ?  ?Patient: Kendra Watts MRN: [id number] DOB: 05-17-1946  ?Admit date:     11/12/2021  ?Discharge date: 11/23/21  ?Discharge Physician: Edwin Dada  ? ?PCP: Merrilee Seashore, MD  ? ? ? ?Recommendations at discharge:  ?Start new amlodipine and Keppra ?Follow up with Neurology in 4-6 weeks ?Follow up with PCP 1 week after discharge from SNF  ?Follow up Cardiac event monitor with Cardiology as directed ?Check TSH in 1 month ? ? ? ? ?Discharge Diagnoses: ?Principal Problem: ?  Intracerebral hemorrhage ?Active Problems: ?  Status epilepticus ([place]) ?  Acute on chronic respiratory failure with hypoxia (HCC) ?  Delirium due to multiple etiologies, acute, hyperactive ?  Acute metabolic encphalopathy ?  COPD with acute exacerbation ([place]) ?  Peripheral arterial disease ([place]) ?  Essential hypertension ?  Hyperlipidemia ?  Breast cancer of upper-outer quadrant of left female breast ([place]) ?  Anemia ?  Adenocarcinoma, lung, right ([place]) ?  Current smoker ?  Protein-calorie malnutrition, severe ?  DNR (do not resuscitate) ?  Hypothyroidism, unspecified ?  Acute cystitis ? ?  ? ? ? ? ?Hospital Course: ?[name] is a 76 y.o. F with chronic heart failure, chronic respiratory failure, chronic kidney disease and history breast and lung cancer who presented with confusion, being found down, seizure. ? ?Evidently found down by a friend.  Developed tonic-clonic seizure en route with EMS.  Intubated and loaded with Keppra in the ER.  Noted extreme elevation in BP and started on Cleviprex.   CTH showed left occipital IPH. ? ? ? ? ? ?* Intracerebral hemorrhage ?Admitted and neuraxial imaging showed small intraparenchymal hemorrhage in the left occipital lobe.  Neurology suspect this was hemorrhagic transformation of a stroke.  ? ?Non-invasive angiography showed <70% stenosis of bilateral common carotids.  Echocardiogram showed no cardiogenic source of embolism. ? ?Lipids ordered: LDL 58,  continued on  Crestor. Aspirin deferred given intracerebral hemorrhage.  Atrial fibrillation: ultra-brief narrow complex tachycardias on monitoring in the hospital, referred for event monitor at discharge ? ?- tPA not given because active bleeding. ?- Dysphagia screen ordered in ER ?- PT eval completed ?- Smoking cessation: recommended, nursing teaching ordered ? ? ? ?Status epilepticus ([place]) ?Due to intraparenchymal hemorrhage.  Intubated and loaded with Keppra in the ER, subsequent EEG negative and no further seizure-like activity. ? ?Continue Keppra new Keppra and follow up with Neurology in 4-6 weeks ? ? ?  ? ?COPD with acute exacerbation ([place]) ?Mild exacerbation.  Got 3d prednisone and wheezing resolved   ? ?Acute cystitis ?Developed fever and some recent hematuria (prior to admission) and recent urinary hesitancy/retention.  Urine culture with insignificnat growth.  Got fosfomycin x1. ?  ? ?Hypothyroidism, unspecified ?TSH low but fT4 normal, T3 low.  Suspect euthyroid sick.    ? ?Acute metabolic encephalopathy ?On admission was confused due to intraparenchymal hemorrhage, status epilepticus, postictal state.   ? ?Now resolved to baseline without cognitive impairment. ?  ? ? ? ? ?  ? ? ? ? ? ?The [facility] Controlled Substances Registry was reviewed for this patient prior to discharge.  ? ?Consultants: Neurology ?Procedures performed: CT head, CTA head and neck, MRI brain, echo ?Disposition: Skilled nursing facility ?Diet Recommendation: Cardiac, heart healthy, DASH ? ? ? ? ?DISCHARGE MEDICATION: ?Allergies as of 11/23/2021   ?No Known Allergies ?  ? ?  ?Medication List  ?  ? ?STOP taking these medications   ? ?furosemide 20 MG tablet ?Commonly  known as: Lasix ?  ?Stiolto Respimat 2.5-2.5 MCG/ACT Aers ?Generic drug: Tiotropium Bromide-Olodaterol ?  ? ?  ? ?TAKE these medications   ? ?albuterol 108 (90 Base) MCG/ACT inhaler ?Commonly known as: VENTOLIN HFA ?Inhale 2 puffs into the lungs every 6 (six) hours  as needed for wheezing or shortness of breath. ?  ?amLODipine 10 MG tablet ?Commonly known as: NORVASC ?Take 1 tablet (10 mg total) by mouth daily. ?Start taking on: Nov 24, 2021 ?  ?aspirin 81 MG tablet ?Take 1 tablet (81 mg total) by mouth daily. ?  ?Breztri Aerosphere 160-9-4.8 MCG/ACT Aero ?Generic drug: Budeson-Glycopyrrol-Formoterol ?Inhale 2 puffs into the lungs in the morning and at bedtime. ?  ?doxazosin 8 MG tablet ?Commonly known as: CARDURA ?Take 1 tablet by mouth daily. ?  ?levETIRAcetam 500 MG tablet ?Commonly known as: KEPPRA ?Take 1 tablet (500 mg total) by mouth 2 (two) times daily. ?  ?levothyroxine 88 MCG tablet ?Commonly known as: SYNTHROID ?Take 88 mcg by mouth daily before breakfast. ?  ?metoprolol tartrate 25 MG tablet ?Commonly known as: LOPRESSOR ?Take 25 mg by mouth daily. ?  ?OXYGEN ?Inhale 2-4 L into the lungs as needed. As needed to maintain SATS > 90% ?  ?pantoprazole 40 MG tablet ?Commonly known as: PROTONIX ?Take 1 tablet (40 mg total) by mouth daily. ?  ?rosuvastatin 10 MG tablet ?Commonly known as: CRESTOR ?Take 10 mg by mouth daily. ?  ? ?  ? ? Follow-up Information   ? ? [facility]. Schedule an appointment as soon as possible for a visit in 1 month(s).   ?Specialty: Neurology ?Why: stroke clinic ?Contact information: ?[address] [place] ?[phone] ? ?  ?  ? ?  ?  ? ?  ? ? ?Discharge Instructions   ? ? Ambulatory referral to Neurology   Complete by: As directed ?  ? Follow up with stroke clinic NP (Jessica Destin or Cecille Rubin, if both not available, consider Zachery Dauer, or [place]) at [facility] in about 4 weeks. Thanks.  ? Increase activity slowly   Complete by: As directed ?  ? No wound care   Complete by: As directed ?  ? ?  ? ? ?Discharge Exam: ?Filed Weights  ? 11/20/21 0424 11/22/21 0500 11/23/21 0357  ?Weight: 42.9 kg 39.8 kg 38.2 kg  ? ? ?General: Pt is alert, awake, not in acute distress, nasal cannula in  place, sitting up in bed ?Cardiovascular: RRR, nl S1-S2, no murmurs appreciated.   No LE edema.   ?Respiratory: Normal respiratory rate and rhythm.  CTAB without rales or wheezes. ?Abdominal: Abdomen soft and non-tender.  No distension or HSM.   ?Neuro/Psych: Strength symmetric in upper and lower extremities.  Judgment and insight appear normal . ? ? ?Condition at discharge: fair ? ?The results of significant diagnostics from this hospitalization (including imaging, microbiology, ancillary and laboratory) are listed below for reference.  ? ?Imaging Studies: ?DG Chest 2 View ? ?Result Date: 11/22/2021 ?CLINICAL DATA:  Fever. EXAM: CHEST - 2 VIEW COMPARISON:  AP chest 11/16/2021 FINDINGS: Interval extubation.  Interval removal of enteric tube. Cardiac silhouette and mediastinal contours are within normal limits with calcification again seen within the aortic arch. Surgical clips overlie the bilateral upper lungs. Flattening of the diaphragms and high-grade hyperinflation. Curvilinear scarring within the superomedial right lung and posterosuperior left lung. Surgical clips overlie the lateral left breast. No new focal airspace opacity to indicate pneumonia. No pleural effusion or pneumothorax.  Diffuse decreased bone mineralization with mild multilevel degenerative disc changes of the thoracic spine. IMPRESSION:: IMPRESSION: 1. Interval extubation and removal of enteric tube. 2. Chronic hyperinflation and emphysematous change. 3. Bilateral lung scarring without acute lung process. Electronically Signed   By: Yvonne Kendall M.D.   On: 11/22/2021 09:05  ? ?CT HEAD WO CONTRAST (5MM) ? ?Result Date: 11/12/2021 ?CLINICAL DATA:  Stroke follow-up 6 hour stability scan. EXAM: CT HEAD WITHOUT CONTRAST TECHNIQUE: Contiguous axial images were obtained from the base of the skull through the vertex without intravenous contrast. RADIATION DOSE REDUCTION: This exam was performed according to the departmental dose-optimization program  which includes automated exposure control, adjustment of the mA and/or kV according to patient size and/or use of iterative reconstruction technique. COMPARISON:  CT head dated November 12, 2021 at 4:37 p.m. FINDINGS

## 2021-11-23 NOTE — Plan of Care (Signed)
?  Problem: Education: ?Goal: Knowledge of disease or condition will improve ?Outcome: Progressing ?Goal: Knowledge of secondary prevention will improve (SELECT ALL) ?Outcome: Progressing ?Goal: Knowledge of patient specific risk factors will improve (INDIVIDUALIZE FOR PATIENT) ?Outcome: Progressing ?  ?Problem: Health Behavior/Discharge Planning: ?Goal: Ability to manage health-related needs will improve ?Outcome: Progressing ?  ?Problem: Nutrition: ?Goal: Risk of aspiration will decrease ?Outcome: Progressing ?  ?Problem: Intracerebral Hemorrhage Tissue Perfusion: ?Goal: Complications of Intracerebral Hemorrhage will be minimized ?Outcome: Progressing ?  ?Problem: Education: ?Goal: Knowledge of General Education information will improve ?Description: Including pain rating scale, medication(s)/side effects and non-pharmacologic comfort measures ?Outcome: Progressing ?  ?Problem: Health Behavior/Discharge Planning: ?Goal: Ability to manage health-related needs will improve ?Outcome: Progressing ?  ?Problem: Clinical Measurements: ?Goal: Ability to maintain clinical measurements within normal limits will improve ?Outcome: Progressing ?Goal: Will remain free from infection ?Outcome: Progressing ?Goal: Diagnostic test results will improve ?Outcome: Progressing ?Goal: Respiratory complications will improve ?Outcome: Progressing ?Goal: Cardiovascular complication will be avoided ?Outcome: Progressing ?  ?Problem: Nutrition: ?Goal: Adequate nutrition will be maintained ?Outcome: Progressing ?  ?Problem: Activity: ?Goal: Risk for activity intolerance will decrease ?Outcome: Progressing ?  ?Problem: Coping: ?Goal: Level of anxiety will decrease ?Outcome: Progressing ?  ?

## 2021-11-24 DIAGNOSIS — J9621 Acute and chronic respiratory failure with hypoxia: Secondary | ICD-10-CM | POA: Diagnosis not present

## 2021-11-24 DIAGNOSIS — G9341 Metabolic encephalopathy: Secondary | ICD-10-CM | POA: Diagnosis not present

## 2021-11-24 DIAGNOSIS — N3001 Acute cystitis with hematuria: Secondary | ICD-10-CM | POA: Diagnosis not present

## 2021-11-24 DIAGNOSIS — I611 Nontraumatic intracerebral hemorrhage in hemisphere, cortical: Secondary | ICD-10-CM | POA: Diagnosis not present

## 2021-11-24 LAB — GLUCOSE, CAPILLARY
Glucose-Capillary: 101 mg/dL — ABNORMAL HIGH (ref 70–99)
Glucose-Capillary: 111 mg/dL — ABNORMAL HIGH (ref 70–99)
Glucose-Capillary: 150 mg/dL — ABNORMAL HIGH (ref 70–99)
Glucose-Capillary: 185 mg/dL — ABNORMAL HIGH (ref 70–99)
Glucose-Capillary: 199 mg/dL — ABNORMAL HIGH (ref 70–99)
Glucose-Capillary: 204 mg/dL — ABNORMAL HIGH (ref 70–99)

## 2021-11-24 NOTE — Progress Notes (Signed)
Occupational Therapy Treatment ?Patient Details ?Name: Kendra Watts ?MRN: [id number] ?DOB: 1945/12/20 ?Today's Date: 11/24/2021 ? ? ?History of present illness 76 y.o. female who [facility] to [facility] hospital [facility]/12/2021 after being found down with seizure like activity and AMS. CT head showed L occipital parenchymal hemorrhage.  Pt intubated on arrival to ED, extubated 4/28. PMH: breast CA, COPD on chronic home 4L O2, CHF, HTN, L hip fx. ?  ?OT comments ? Pt. Seen for skilled OT treatment session.  Declines oob to sink or b.room secondary to being cold.  Agreeable to eob lb dressing task for continued work on adls. Min a for don/doff socks with cues for safety and sequencing.  Min guard for bed mobility HOB elevated.  Would benefit from continued therapies to increase strength and safety with adls/mobility prior to home.  Agree with current recommendations for AIR level therapies.    ? ?Recommendations for follow up therapy are one component of a multi-disciplinary discharge planning process, led by the attending physician.  Recommendations may be updated based on patient status, additional functional criteria and insurance authorization. ?   ?Follow Up Recommendations ? Acute inpatient rehab (3hours/day)  ?  ?Assistance Recommended at Discharge Frequent or constant Supervision/Assistance  ?Patient can return home with the following ? A lot of help with walking and/or transfers;A lot of help with bathing/dressing/bathroom ?  ?Equipment Recommendations ? None recommended by OT;Other (comment)  ?  ?Recommendations for Other Services Rehab consult;PT consult;Speech consult ? ?  ?Precautions / Restrictions Precautions ?Precautions: Fall  ? ? ?  ? ?Mobility Bed Mobility ?Overal bed mobility: Needs Assistance ?Bed Mobility: Supine to Sit ?  ?  ?Supine to sit: Min guard, HOB elevated ?  ?  ?General bed mobility comments: min guard for safety, + time and effort ?  ? ?Transfers ?  ?  ?  ?  ?  ?  ?  ?  ?  ?General transfer comment:  seated eob but did scoot towards hob x3 scoots prior to laying back down ?  ?  ?Balance   ?  ?  ?  ?  ?  ?  ?  ?  ?  ?  ?  ?  ?  ?  ?  ?  ?  ?  ?   ? ?ADL either performed or assessed with clinical judgement  ? ?ADL Overall ADL's : Needs assistance/impaired ?  ?  ?  ?  ?  ?  ?  ?  ?  ?  ?Lower Body Dressing: Cueing for sequencing;Minimal assistance;Sitting/lateral leans ?Lower Body Dressing Details (indicate cue type and reason): don/doff socks eob ?  ?  ?  ?  ?  ?  ?  ?General ADL Comments: pt. reluctant for particpation secondary to c/o being cold and not wanting to move around a lot.  agreeable to eob lb dressing task and bed mobility.  assisted pt. back to bed with extra blankets addressing where she said she was cold the most.  also provided cushion lower back/buttocks where she said she was un comfortable. ?  ? ?Extremity/Trunk Assessment   ?  ?  ?  ?  ?  ? ?Vision   ?  ?  ?Perception   ?  ?Praxis   ?  ? ?Cognition Arousal/Alertness: Awake/alert ?Behavior During Therapy: [facility] for tasks assessed/performed ?Overall Cognitive Status: Within Functional Limits for tasks assessed ?  ?  ?  ?  ?  ?  ?  ?  ?  ?  ?  ?  ?  ?  ?  ?  ?  ?  ?  ?   ?  Exercises   ? ?  ?Shoulder Instructions   ? ? ?  ?General Comments    ? ? ?Pertinent Vitals/ Pain       Pain Assessment ?Pain Assessment: Faces ?Faces Pain Scale: Hurts a little bit ?Pain Location: bottom ?Pain Descriptors / Indicators: Discomfort ?Pain Intervention(s): Repositioned, Limited activity within patient's tolerance, Monitored during session ? ?Home Living   ?  ?  ?  ?  ?  ?  ?  ?  ?  ?  ?  ?  ?  ?  ?  ?  ?  ?  ? ?  ?Prior Functioning/Environment    ?  ?  ?  ?   ? ?Frequency ? Min 2X/week  ? ? ? ? ?  ?Progress Toward Goals ? ?OT Goals(current goals can now be found in the care plan section) ? Progress towards OT goals: Progressing toward goals ? ?   ?Plan Discharge plan remains appropriate;Frequency remains appropriate   ? ?Co-evaluation ? ? ?   ?  ?  ?  ?  ? ?  ?AM-PAC  OT "6 Clicks" Daily Activity     ?Outcome Measure ? ? Help from another person eating meals?: None ?Help from another person taking care of personal grooming?: A Little ?Help from another person toileting, which includes using toliet, bedpan, or urinal?: A Little ?Help from another person bathing (including washing, rinsing, drying)?: A Little ?Help from another person to put on and taking off regular upper body clothing?: None ?Help from another person to put on and taking off regular lower body clothing?: A Little ?6 Click Score: 20 ? ?  ?End of Session   ? ?OT Visit Diagnosis: Unsteadiness on feet (R26.81);Other abnormalities of gait and mobility (R26.89);Muscle weakness (generalized) (M62.81) ?  ?Activity Tolerance Patient tolerated treatment well ?  ?Patient Left in bed;with call bell/phone within reach;with bed alarm set ?  ?Nurse Communication   ?  ? ?   ? ?Time: 0340-3524 ?OT Time Calculation (min): 10 min ? ?Charges: OT General Charges ?$OT Visit: 1 Visit ?OT Treatments ?$Self Care/Home Management : 8-22 mins ? ?Sonia Baller, COTA/L ?Acute Rehabilitation ?[phone]  ? ?Tanya Nones ?11/24/2021, 11:23 AM ?

## 2021-11-24 NOTE — Progress Notes (Addendum)
Attempted to give report. Spoke with Network engineer and left direct call back number. She stated someone will call to receive report shortly gave report to Currie Paris AT 5:34PM  ?

## 2021-11-24 NOTE — Progress Notes (Signed)
?  Progress Note ? ? ?Patient: Kendra Watts BSW:[id number] DOB: [birth year] DOA: 11/12/2021     12 ?DOS: the patient was seen and examined on 11/24/2021 at 11:32AM ?  ? ? ? ? ?Assessment and Plan: ?* Intracerebral hemorrhage ?- Continue Crestor ?- Aspirin deferred  ? ? ? ?Status epilepticus ([place]) ?- Continue new Keppra ?  ? ?COPD   ?- Continue home ICS/LABA ?  ? ?Hypothyroidism, unspecified ?- Continue levothyroxine ?- Check TSH in 1 month ?    ? ?Essential hypertension ?BP controlled ?- Continue new amlodipine ?- Continue metoprolol, doxazosin ? ? ? ? ? ? ? ? ? ? ?Subjective: Feels well. No fever, dyspnea. ? ? ? ? ?Physical Exam: ?Vitals:  ? 11/24/21 0626 11/24/21 0735 11/24/21 0903 11/24/21 1155  ?BP: 112/74 115/68  106/70  ?Pulse: 71 66  72  ?Resp: 16 18  20   ?Temp: 97.9 ?F (36.6 ?C) 98.9 ?F (37.2 ?C)  98.7 ?F (37.1 ?C)  ?TempSrc:  Oral  Oral  ?SpO2: 100% 97% 96% 95%  ?Weight:      ?Height:      ? ?Adult female, sitting up in bed, interactive appropriate. ?RRR no murmurs, no LE edema. ?RR normal, lungs clear without rales or wheezes. ?Face symmetric, speech fluent, moves upper extremities with normal strength and coordination. ? ? ?Family Communication: Friend at the bedside ? ? ? ?Disposition: ?Status is: Inpatient ? ? ? ? ? ? ? ? ?Author: ?Edwin Dada, MD ?11/24/2021 1:17 PM ? ?For on call review [email or web address].  ? ? ?

## 2021-11-24 NOTE — Progress Notes (Signed)
[facility] called and informed RN that [facility]  will not accept pt at this time.  It is too late to transport patient to the facility.  Charge Nurse made aware of the situation. ?

## 2021-11-24 NOTE — TOC Transition Note (Signed)
Transition of Care (TOC) - CM/SW Discharge Note ? ? ?Patient Details  ?Name: Kendra Watts ?MRN: [id number] ?Date of Birth: 1946-07-08 ? ?Transition of Care (TOC) CM/SW Contact:  ?Amador Cunas, LCSW ?Phone Number: ?11/24/2021, 1:35 PM ? ? ?Clinical Narrative:   pt's nephew, [name], has completed admission paperwork with [facility]. Per [name] with [facility], they are prepared to admit pt to room 3221. Packet complete and RN provided with number for report. Pt's nephew aware of dc and reports agreeable. SW signing off at dc.  ? ?[name], MSW, LCSW ?[phone] (coverage) ? ? ? ? ? ?Final next level of care: [place] ?Barriers to Discharge: No Barriers Identified ? ? ?Patient Goals and CMS Choice ?  ?CMS Medicare.gov Compare Post Acute Care list provided to:: Patient Represenative (must comment) ?Choice offered to / list presented to : Patient, Sibling ? ?Discharge Placement ?  ?           ?Patient chooses bed at: [place] ?Patient to be transferred to facility by: [facility] ?Name of family member notified: [name] ?Patient and family notified of of transfer: 11/24/21 ? ?Discharge Plan and Services ?  ?  ?Post Acute Care Choice: [place]          ?  ?  ?  ?  ?  ?  ?  ?  ?  ?  ? ?Social Determinants of Health (SDOH) Interventions ?  ? ? ?Readmission Risk Interventions ? ?  03/16/2020  ?  2:24 PM  ?Readmission Risk Prevention Plan  ?Transportation Screening Complete  ?PCP or Specialist Appt within 3-5 Days Complete  ?[place] or Home Care Consult Complete  ?Social Work Consult for [place] Planning/Counseling Complete  ?Palliative Care Screening Not Applicable  ?Medication Review Press photographer) Complete  ? ? ? ? ? ?

## 2021-11-25 DIAGNOSIS — D649 Anemia, unspecified: Secondary | ICD-10-CM | POA: Diagnosis not present

## 2021-11-25 DIAGNOSIS — J441 Chronic obstructive pulmonary disease with (acute) exacerbation: Secondary | ICD-10-CM | POA: Diagnosis not present

## 2021-11-25 DIAGNOSIS — C50212 Malignant neoplasm of upper-inner quadrant of left female breast: Secondary | ICD-10-CM | POA: Diagnosis not present

## 2021-11-25 DIAGNOSIS — Z20822 Contact with and (suspected) exposure to covid-19: Secondary | ICD-10-CM | POA: Diagnosis not present

## 2021-11-25 DIAGNOSIS — J40 Bronchitis, not specified as acute or chronic: Secondary | ICD-10-CM | POA: Diagnosis not present

## 2021-11-25 DIAGNOSIS — J9611 Chronic respiratory failure with hypoxia: Secondary | ICD-10-CM | POA: Diagnosis not present

## 2021-11-25 DIAGNOSIS — E785 Hyperlipidemia, unspecified: Secondary | ICD-10-CM | POA: Diagnosis not present

## 2021-11-25 DIAGNOSIS — J9610 Chronic respiratory failure, unspecified whether with hypoxia or hypercapnia: Secondary | ICD-10-CM | POA: Diagnosis not present

## 2021-11-25 DIAGNOSIS — G9341 Metabolic encephalopathy: Secondary | ICD-10-CM | POA: Diagnosis not present

## 2021-11-25 DIAGNOSIS — I509 Heart failure, unspecified: Secondary | ICD-10-CM | POA: Diagnosis not present

## 2021-11-25 DIAGNOSIS — E871 Hypo-osmolality and hyponatremia: Secondary | ICD-10-CM | POA: Diagnosis not present

## 2021-11-25 DIAGNOSIS — J189 Pneumonia, unspecified organism: Secondary | ICD-10-CM | POA: Diagnosis not present

## 2021-11-25 DIAGNOSIS — I611 Nontraumatic intracerebral hemorrhage in hemisphere, cortical: Secondary | ICD-10-CM | POA: Diagnosis not present

## 2021-11-25 DIAGNOSIS — C50919 Malignant neoplasm of unspecified site of unspecified female breast: Secondary | ICD-10-CM | POA: Diagnosis not present

## 2021-11-25 DIAGNOSIS — E782 Mixed hyperlipidemia: Secondary | ICD-10-CM | POA: Diagnosis not present

## 2021-11-25 DIAGNOSIS — N3001 Acute cystitis with hematuria: Secondary | ICD-10-CM | POA: Diagnosis not present

## 2021-11-25 DIAGNOSIS — I10 Essential (primary) hypertension: Secondary | ICD-10-CM | POA: Diagnosis not present

## 2021-11-25 DIAGNOSIS — C3490 Malignant neoplasm of unspecified part of unspecified bronchus or lung: Secondary | ICD-10-CM | POA: Diagnosis not present

## 2021-11-25 DIAGNOSIS — E039 Hypothyroidism, unspecified: Secondary | ICD-10-CM | POA: Diagnosis not present

## 2021-11-25 DIAGNOSIS — C3491 Malignant neoplasm of unspecified part of right bronchus or lung: Secondary | ICD-10-CM | POA: Diagnosis not present

## 2021-11-25 DIAGNOSIS — I619 Nontraumatic intracerebral hemorrhage, unspecified: Secondary | ICD-10-CM | POA: Diagnosis not present

## 2021-11-25 DIAGNOSIS — K219 Gastro-esophageal reflux disease without esophagitis: Secondary | ICD-10-CM | POA: Diagnosis not present

## 2021-11-25 DIAGNOSIS — G40901 Epilepsy, unspecified, not intractable, with status epilepticus: Secondary | ICD-10-CM | POA: Diagnosis not present

## 2021-11-25 DIAGNOSIS — I739 Peripheral vascular disease, unspecified: Secondary | ICD-10-CM | POA: Diagnosis not present

## 2021-11-25 DIAGNOSIS — N390 Urinary tract infection, site not specified: Secondary | ICD-10-CM | POA: Diagnosis not present

## 2021-11-25 DIAGNOSIS — J449 Chronic obstructive pulmonary disease, unspecified: Secondary | ICD-10-CM | POA: Diagnosis not present

## 2021-11-25 DIAGNOSIS — J9621 Acute and chronic respiratory failure with hypoxia: Secondary | ICD-10-CM | POA: Diagnosis not present

## 2021-11-25 DIAGNOSIS — R918 Other nonspecific abnormal finding of lung field: Secondary | ICD-10-CM | POA: Diagnosis not present

## 2021-11-25 LAB — GLUCOSE, CAPILLARY
Glucose-Capillary: 166 mg/dL — ABNORMAL HIGH (ref 70–99)
Glucose-Capillary: 112 mg/dL — ABNORMAL HIGH (ref 70–99)
Glucose-Capillary: 180 mg/dL — ABNORMAL HIGH (ref 70–99)

## 2021-11-25 NOTE — Progress Notes (Addendum)
Per nursing note at 2059 last night, [facility] stated it was too late to transport Kendra Watts. Spoke to [place] with Kendra Watts this morning who reports they were not contacted by [facility] re late admission. [facility] has been called again this morning at 9am for pick up. Charge RN updated.  ? ?[name], MSW, LCSW ?[phone] (coverage) ? ? ?

## 2021-11-25 NOTE — Discharge Summary (Incomplete)
Pt rolled off unit by [facility].;cell phone can charger with her.called to give additional report to [employer] gave my direct number to RN said she will call if needed.  ?

## 2021-11-25 NOTE — Progress Notes (Signed)
?  Progress Note ? ? ?Patient: Kendra Watts FHL:[id number] DOB: 09/14/1945 DOA: 11/12/2021     13 ?DOS: the patient was seen and examined on 11/25/2021 at 11:32AM ?  ? ? ? ? ?Assessment and Plan: ?*Intracerebral hemorrhage ?- Continue Crestor ?- Aspirin deferred ? ? ? ?Seizures ?- Continue Keppra ?  ? ?COPD   ?- Continue home ICS/LABA ?  ? ?Hypothyroidism ?- Continue levothyroxine ?    ?SVTs ?Having brief nonsustained narrow complex SVTs, 5-10 beats, lasting just a few seconds in the last few days.  These are asymptomatic.  A-fib is the obvious concern. ?- Outpatient monitoring has already been arranged ? ?Hypertension ?BP controlled ?- Continue new amlodipine, continue doxazosin and metoprolol ? ? ? ? ? ? ? ? ? ?Subjective: No change, no new complaints ? ? ? ? ?Physical Exam: ?Vitals:  ? 11/25/21 0010 11/25/21 0402 11/25/21 0800 11/25/21 0824  ?BP: 110/65 110/81 122/70   ?Pulse: 77 72 72   ?Resp: 20 18 17    ?Temp: 98.9 ?F (37.2 ?C) 97.7 ?F (36.5 ?C) 98 ?F (36.7 ?C)   ?TempSrc: Oral Oral Oral   ?SpO2: 99% 100% 98% 92%  ?Weight:      ?Height:      ? ?Adult female, lying in bed, interactive, appropriate ?RRR, no murmurs, no peripheral edema ?Respiratory rate normal, lungs clear without rales or wheezes ? ? ?  ? ? ? ?Disposition: ?Status is: Inpatient ? ? ? ? ? ? ? ? ?Author: ?Edwin Dada, MD ?11/25/2021 12:25 PM ? ?For on call review [email or web address].  ? ? ?

## 2021-11-26 DIAGNOSIS — E039 Hypothyroidism, unspecified: Secondary | ICD-10-CM | POA: Diagnosis not present

## 2021-11-26 DIAGNOSIS — C3490 Malignant neoplasm of unspecified part of unspecified bronchus or lung: Secondary | ICD-10-CM | POA: Diagnosis not present

## 2021-11-26 DIAGNOSIS — K219 Gastro-esophageal reflux disease without esophagitis: Secondary | ICD-10-CM | POA: Diagnosis not present

## 2021-11-26 DIAGNOSIS — I739 Peripheral vascular disease, unspecified: Secondary | ICD-10-CM | POA: Diagnosis not present

## 2021-11-26 DIAGNOSIS — D649 Anemia, unspecified: Secondary | ICD-10-CM | POA: Diagnosis not present

## 2021-11-26 DIAGNOSIS — I509 Heart failure, unspecified: Secondary | ICD-10-CM | POA: Diagnosis not present

## 2021-11-26 DIAGNOSIS — E782 Mixed hyperlipidemia: Secondary | ICD-10-CM | POA: Diagnosis not present

## 2021-11-26 DIAGNOSIS — I10 Essential (primary) hypertension: Secondary | ICD-10-CM | POA: Diagnosis not present

## 2021-11-26 DIAGNOSIS — J40 Bronchitis, not specified as acute or chronic: Secondary | ICD-10-CM | POA: Diagnosis not present

## 2021-11-26 DIAGNOSIS — J9610 Chronic respiratory failure, unspecified whether with hypoxia or hypercapnia: Secondary | ICD-10-CM | POA: Diagnosis not present

## 2021-11-26 DIAGNOSIS — C50212 Malignant neoplasm of upper-inner quadrant of left female breast: Secondary | ICD-10-CM | POA: Diagnosis not present

## 2021-11-26 DIAGNOSIS — J449 Chronic obstructive pulmonary disease, unspecified: Secondary | ICD-10-CM | POA: Diagnosis not present

## 2021-11-27 DIAGNOSIS — Z20822 Contact with and (suspected) exposure to covid-19: Secondary | ICD-10-CM | POA: Diagnosis not present

## 2021-11-28 DIAGNOSIS — C3490 Malignant neoplasm of unspecified part of unspecified bronchus or lung: Secondary | ICD-10-CM | POA: Diagnosis not present

## 2021-11-28 DIAGNOSIS — E039 Hypothyroidism, unspecified: Secondary | ICD-10-CM | POA: Diagnosis not present

## 2021-11-28 DIAGNOSIS — J9610 Chronic respiratory failure, unspecified whether with hypoxia or hypercapnia: Secondary | ICD-10-CM | POA: Diagnosis not present

## 2021-11-28 DIAGNOSIS — E871 Hypo-osmolality and hyponatremia: Secondary | ICD-10-CM | POA: Diagnosis not present

## 2021-11-29 DIAGNOSIS — I10 Essential (primary) hypertension: Secondary | ICD-10-CM | POA: Diagnosis not present

## 2021-11-29 DIAGNOSIS — E039 Hypothyroidism, unspecified: Secondary | ICD-10-CM | POA: Diagnosis not present

## 2021-11-29 DIAGNOSIS — I509 Heart failure, unspecified: Secondary | ICD-10-CM | POA: Diagnosis not present

## 2021-11-29 DIAGNOSIS — E782 Mixed hyperlipidemia: Secondary | ICD-10-CM | POA: Diagnosis not present

## 2021-11-29 DIAGNOSIS — J449 Chronic obstructive pulmonary disease, unspecified: Secondary | ICD-10-CM | POA: Diagnosis not present

## 2021-11-30 DIAGNOSIS — J449 Chronic obstructive pulmonary disease, unspecified: Secondary | ICD-10-CM | POA: Diagnosis not present

## 2021-11-30 DIAGNOSIS — I10 Essential (primary) hypertension: Secondary | ICD-10-CM | POA: Diagnosis not present

## 2021-11-30 DIAGNOSIS — J9610 Chronic respiratory failure, unspecified whether with hypoxia or hypercapnia: Secondary | ICD-10-CM | POA: Diagnosis not present

## 2021-11-30 DIAGNOSIS — I509 Heart failure, unspecified: Secondary | ICD-10-CM | POA: Diagnosis not present

## 2021-12-04 DIAGNOSIS — J9610 Chronic respiratory failure, unspecified whether with hypoxia or hypercapnia: Secondary | ICD-10-CM | POA: Diagnosis not present

## 2021-12-04 DIAGNOSIS — J449 Chronic obstructive pulmonary disease, unspecified: Secondary | ICD-10-CM | POA: Diagnosis not present

## 2021-12-04 DIAGNOSIS — I509 Heart failure, unspecified: Secondary | ICD-10-CM | POA: Diagnosis not present

## 2021-12-04 DIAGNOSIS — I10 Essential (primary) hypertension: Secondary | ICD-10-CM | POA: Diagnosis not present

## 2021-12-04 DIAGNOSIS — J189 Pneumonia, unspecified organism: Secondary | ICD-10-CM | POA: Diagnosis not present

## 2021-12-13 DIAGNOSIS — J449 Chronic obstructive pulmonary disease, unspecified: Secondary | ICD-10-CM | POA: Diagnosis not present

## 2021-12-13 DIAGNOSIS — I509 Heart failure, unspecified: Secondary | ICD-10-CM | POA: Diagnosis not present

## 2021-12-13 DIAGNOSIS — J9610 Chronic respiratory failure, unspecified whether with hypoxia or hypercapnia: Secondary | ICD-10-CM | POA: Diagnosis not present

## 2021-12-13 DIAGNOSIS — N390 Urinary tract infection, site not specified: Secondary | ICD-10-CM | POA: Diagnosis not present

## 2021-12-13 DIAGNOSIS — I10 Essential (primary) hypertension: Secondary | ICD-10-CM | POA: Diagnosis not present

## 2021-12-13 DIAGNOSIS — J189 Pneumonia, unspecified organism: Secondary | ICD-10-CM | POA: Diagnosis not present

## 2021-12-24 DIAGNOSIS — I509 Heart failure, unspecified: Secondary | ICD-10-CM | POA: Diagnosis not present

## 2021-12-24 DIAGNOSIS — I10 Essential (primary) hypertension: Secondary | ICD-10-CM | POA: Diagnosis not present

## 2021-12-24 DIAGNOSIS — J449 Chronic obstructive pulmonary disease, unspecified: Secondary | ICD-10-CM | POA: Diagnosis not present

## 2021-12-24 DIAGNOSIS — J9610 Chronic respiratory failure, unspecified whether with hypoxia or hypercapnia: Secondary | ICD-10-CM | POA: Diagnosis not present

## 2021-12-26 NOTE — Progress Notes (Deleted)
Patient: Kendra Watts Date of Birth: Sep 28, 1945  Reason for Visit: Follow up History from: Patient Primary Neurologist:    ASSESSMENT AND PLAN 76 y.o. year old female   63.  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  Seizure -Long-term EEG failed to show seizure activity -Continue Keppra 750 mg twice a day  3.  Hypertension -On metoprolol, amlodipine was added -Long term goal < 130/80  4.  Hyperlipidemia -LDL 58, goal less than 70 -On rosuvastatin 10 mg daily  5.  Tobacco abuse  HISTORY OF PRESENT ILLNESS: Today 12/26/21 Kendra Watts here for stroke clinic follow-up post left-sided occipital lobe hemorrhage 11/12/21.  Presented to the ED after collapsing, with witnessed tonic-clonic activity.  She was intubated in the ED due to AMS.  Given Keppra load.  Prolonged EEG did not show any seizure activity. Made DNR. D/C to SNF  -MRI of the brain showed stable left occipital hematoma, no metastasis or other lesion noted. -CTA head and neck 50% stenosis of the mid right common carotid, 50% stenosis of the distal left common carotid artery -2D echo EF 55 to 60% -LDL 58 -A1c 5.6 -Aspirin 81 mg daily prior to arrival, now no antithrombotic due to [place] -Needs 30-day cardiac monitor     HISTORY  Copied H & P Dr. [place] 11/12/21: Kendra Watts is a 76 y.o. female with PMH HTN, HLD, CHF, hypothyroidism, non-functional left kidney, breast cancer, lung adenocarcinoma, tobacco abuse, COPD on 3 L home O2 who presented to [place] (11/12/2021) for Code stroke after episode of staring and witnessed fall. She had reportedly left-sided shaking with EMS and was intubated on arrival to ED due to extremely poor mental status and inability to protect her airway.  Per ED provider  she did have gag during intubation and equal pupils. She received versed, etomidate, succinylcholine and propofol. Keppra 1500mg  and ativan 2mg  for seizure.    After intubation and en route to CT, patient started posturing and bitting ETT.  CT showed acute hemorrhage in left occipital lobe     EMS: SBPs in the 190s, CBG: 208, Non rebreather 15L   LKN: 2:10pm on 11/12/2021 tNK: Contraindicated - hemorrhage Thrombectomy: Contraindicated - hemorrhage   Additional history obtained from [facility] [phone]).  She reports that she and her sister care for the patient.  Today she came at 11:15 AM to take the patient's dog to the [place].  When she returned at around 2 PM the patient was fine.  The patient went outside to smoke a cigarette and came back in 215 and initially still seemed fine.  However when the patient was asked a question she would not answer and was staring straight ahead.  She had to be asked repeatedly to sit down and did finally sit down.  Then she spontaneously got up and ate a banana.  However she could not speak and her mouth seemed twisted.  Ms. Midge Aver then called her sister Nevin Bloodgood [phone]) for advice and when she turned around to the patient had fallen on the floor and was foaming at the mouth.  REVIEW OF SYSTEMS: Out of a complete 14 system review of symptoms, the patient complains only of the following symptoms, and all other reviewed systems are negative.  See HPI  ALLERGIES: No Known Allergies  HOME MEDICATIONS: Outpatient Medications Prior to Visit  Medication Sig Dispense Refill   albuterol (VENTOLIN HFA) 108 (90 Base) MCG/ACT inhaler Inhale 2 puffs into the lungs every 6 (six) hours as needed for wheezing or shortness of breath. 8 g 3   amLODipine (NORVASC) 10 MG tablet Take 1 tablet (10 mg total) by mouth daily.     aspirin 81 MG tablet Take 1 tablet (81 mg total) by mouth daily.     Budeson-Glycopyrrol-Formoterol (BREZTRI AEROSPHERE) 160-9-4.8 MCG/ACT AERO  Inhale 2 puffs into the lungs in the morning and at bedtime. 10.7 g 3   doxazosin (CARDURA) 8 MG tablet Take 1 tablet by mouth daily.     levETIRAcetam (KEPPRA) 500 MG tablet Take 1 tablet (500 mg total) by mouth 2 (two) times daily.     levothyroxine (SYNTHROID) 88 MCG tablet Take 88 mcg by mouth daily before breakfast.     metoprolol tartrate (LOPRESSOR) 25 MG tablet Take 25 mg by mouth daily.     OXYGEN Inhale 2-4 L into the lungs as needed. As needed to maintain SATS > 90%     pantoprazole (PROTONIX) 40 MG tablet Take 1 tablet (40 mg total) by mouth daily. 30 tablet 0   rosuvastatin (CRESTOR) 10 MG tablet Take 10 mg by mouth daily.     No facility-administered medications prior to visit.    PAST MEDICAL HISTORY: Past Medical History:  Diagnosis Date   Anemia    Aortic arch anomaly    arteria lusoria    Breast cancer ([place]) 09/22/2015   Malignant   Breast cancer of upper-outer quadrant of left female breast ([place]) 09/08/2015   Cataract, immature    CHF (congestive heart failure) (HCC)    Acute CHF-06/2018   COPD (chronic obstructive pulmonary disease) ([place])    Encephalopathy acute 11/12/2021   Femur fracture, left ([place]) 04/11/2020   left femur fracture( greater trochanter   Heart murmur    states no known problems   History of cervical spine x-ray 11/13/2021   History of hyperthyroidism    History of radiation therapy    bilateral lungs - 10/27/18-11/02/18, Dr. [name]   History of radiation therapy 11/28/2015   left breast 10/30/2015-11/28/2015   Dr [name]   Hyperlipidemia    Hypertension    states under control with meds., has been on med. x "long time"   Hypokalemia    from last physical.    Hypothyroidism    Nonfunctioning kidney    left   Personal history of radiation therapy    2017   Pulmonary nodules    Bilateral   Radiation 10/30/15-11/28/15   left breast 42.72 Gy, boosted to 10 Gy   Renal artery stenosis (HCC)    Tobacco abuse    Wears partial  dentures    upper and lower    PAST SURGICAL HISTORY: Past Surgical History:  Procedure Laterality Date   ABDOMINAL ANGIOGRAM  02/18/2012   Procedure: ABDOMINAL ANGIOGRAM;  Surgeon: Lorretta Harp, MD;  Location: [facility] CATH LAB;  Service: Cardiovascular;;   ABDOMINAL AORTAGRAM  07/04/2014   ABDOMINAL HYSTERECTOMY  ~ 1977   partial   APPENDECTOMY     ARCH AORTOGRAM     BREAST LUMPECTOMY Left 09/22/2015   Malignant   CAROTID ANGIOGRAM N/A 02/18/2012   Procedure: CAROTID ANGIOGRAM;  Surgeon: Lorretta Harp, MD;  Location: [facility] CATH LAB;  Service: Cardiovascular;  Laterality: N/A;   ENDARTERECTOMY  04/02/2012  Procedure: ENDARTERECTOMY CAROTID;  Surgeon: Serafina Mitchell, MD;  Location: [place];  Service: Vascular;  Laterality: Right;   FUDUCIAL PLACEMENT Bilateral 09/02/2018   Procedure: Placement Of Fiducial to right upper lobe & left upper lobe lung;  Surgeon: [name], MD;  Location: [place];  Service: Thoracic;  Laterality: Bilateral;   IR THORACENTESIS ASP PLEURAL SPACE W/IMG GUIDE  07/08/2018   RADIOACTIVE SEED GUIDED PARTIAL MASTECTOMY WITH AXILLARY SENTINEL LYMPH NODE BIOPSY Left 09/22/2015   Procedure: INJECT BLUE DYE LEFT BREAST,RADIOACTIVE SEED GUIDED PARTIAL MASTECTOMY WITH AXILLARY SENTINEL LYMPH NODE BIOPSY;  Surgeon: Fanny Skates, MD;  Location: [place];  Service: General;  Laterality: Left;   RENAL ANGIOGRAM Left 06/08/2010   renal artery stent -  5x12 Genesis on Aviator balloon stent (Dr. Adora Fridge)   RENAL ANGIOGRAM Right 07/04/2014   Procedure: RENAL ANGIOGRAM;  Surgeon: Lorretta Harp, MD;  Location[facility] C[facility] LAB;  Service: Cardiovascular;  Laterality: Right;   RENAL ANGIOGRAM Right 08/22/2014   Procedure: RENAL ANGIOGRAM;  Surgeon: Lorretta Harp, MD;  Location: [facility] CATH LAB;  Service: Cardiovascular;  Laterality: Right;   TONSILLECTOMY     as a child   VIDEO BRONCHOSCOPY WITH ENDOBRONCHIAL NAVIGATION N/A 09/02/2018   Procedure: VIDEO BRONCHOSCOPY  WITH ENDOBRONCHIAL NAVIGATION;  Surgeon: [name], MD;  Location: [facility] OR;  Service: Thoracic;  Laterality: N/A;    FAMILY HISTORY: Family History  Problem Relation Age of Onset   Heart disease Mother        MI @ 32, died at 29   Cancer Father    Lung cancer Father    Heart disease Maternal Grandmother    Lung cancer Sister    Breast cancer Paternal Aunt     SOCIAL HISTORY: Social History   Socioeconomic History   Marital status: Divorced    Spouse name: Not on file   Number of children: 0   Years of education: College   Highest education level: Bachelor's degree (e.g., BA, AB, BS)  Occupational History   Occupation: HR Technical brewer: [employer]  Tobacco Use   Smoking status: Every Day    Packs/day: 0.50    Years: 40.00    Pack years: 20.00    Types: Cigarettes   Smokeless tobacco: Never   Tobacco comments:    15 cigarettes smoked daily ARJ 04/19/21  Vaping Use   Vaping Use: Never used  Substance and Sexual Activity   Alcohol use: Yes    Comment: occasionally   Drug use: No   Sexual activity: Never  Other Topics Concern   Not on file  Social History Narrative   Not on file   Social Determinants of Health   Financial Resource Strain: Not on file  Food Insecurity: Not on file  Transportation Needs: Not on file  Physical Activity: Not on file  Stress: Not on file  Social Connections: Not on file  Intimate Partner Violence: Not on file    PHYSICAL EXAM  There were no vitals filed for this visit. There is no height or weight on file to calculate BMI.  Generalized: Well developed, in no acute distress  Neurological examination  Mentation: Alert oriented to time, place, history taking. Follows all commands speech and language fluent Cranial nerve II-XII: Pupils were equal round reactive to light. Extraocular movements were full, visual field were full on confrontational test. Facial sensation and strength were normal. Uvula tongue  midline. Head turning and shoulder shrug  were normal and symmetric. Motor: The motor testing reveals 5 over 5 strength of all 4 extremities. Good symmetric motor tone is noted throughout.  Sensory: Sensory testing is intact to soft touch on all 4 extremities. No evidence of extinction is noted.  Coordination: Cerebellar testing reveals good finger-nose-finger and heel-to-shin bilaterally.  Gait and station: Gait is normal. Tandem gait is normal. Romberg is negative. No drift is seen.  Reflexes: Deep tendon reflexes are symmetric and normal bilaterally.   DIAGNOSTIC DATA (LABS, IMAGING, TESTING) - I reviewed patient records, labs, notes, testing and imaging myself where available.  Lab Results  Component Value Date   WBC 7.3 11/21/2021   HGB 10.8 (L) 11/21/2021   HCT 33.3 (L) 11/21/2021   MCV 90.2 11/21/2021   PLT 257 11/21/2021      Component Value Date/Time   NA 140 11/23/2021 0301   NA 139 09/13/2015 1224   K 3.4 (L) 11/23/2021 0301   K 4.1 09/13/2015 1224   CL 101 11/23/2021 0301   CO2 32 11/23/2021 0301   CO2 22 09/13/2015 1224   GLUCOSE 93 11/23/2021 0301   GLUCOSE 157 (H) 09/13/2015 1224   BUN 15 11/23/2021 0301   BUN 14.8 09/13/2015 1224   CREATININE 0.63 11/23/2021 0301   CREATININE 0.96 02/24/2019 1142   CREATININE 1.0 09/13/2015 1224   CALCIUM 8.6 (L) 11/23/2021 0301   CALCIUM 9.8 09/13/2015 1224   PROT 6.7 11/12/2021 1610   PROT 6.8 04/23/2017 0931   PROT 6.9 09/13/2015 1224   ALBUMIN 2.8 (L) 11/23/2021 0301   ALBUMIN 4.5 04/23/2017 0931   ALBUMIN 3.9 09/13/2015 1224   AST 30 11/12/2021 1610   AST 18 02/24/2019 1142   AST 19 09/13/2015 1224   ALT 19 11/12/2021 1610   ALT 11 02/24/2019 1142   ALT 16 09/13/2015 1224   ALKPHOS 67 11/12/2021 1610   ALKPHOS 65 09/13/2015 1224   BILITOT 0.4 11/12/2021 1610   BILITOT 0.4 02/24/2019 1142   BILITOT 0.37 09/13/2015 1224   GFRNONAA >60 11/23/2021 0301   GFRNONAA 59 (L) 02/24/2019 1142   GFRAA >60 04/12/2020  1057   GFRAA >60 02/24/2019 1142   Lab Results  Component Value Date   CHOL 152 11/12/2021   HDL 79 11/12/2021   LDLCALC 58 11/12/2021   TRIG 74 11/12/2021   CHOLHDL 1.9 11/12/2021   Lab Results  Component Value Date   HGBA1C 5.6 11/12/2021   Lab Results  Component Value Date   VITAMINB12 424 11/20/2021   Lab Results  Component Value Date   TSH 0.078 (L) 11/12/2021    Butler Denmark, AGNP-C, DNP 12/26/2021, 8:54 PM [facility] [address], [place], [place] [address] [phone]

## 2021-12-27 ENCOUNTER — Encounter: Payer: Self-pay | Admitting: Neurology

## 2021-12-27 ENCOUNTER — Inpatient Hospital Stay: Payer: Self-pay | Admitting: Neurology

## 2021-12-28 ENCOUNTER — Telehealth: Payer: Self-pay | Admitting: Cardiovascular Disease

## 2021-12-28 NOTE — Telephone Encounter (Signed)
Patient has not done her 30 day event monitor.  She wants to know if she still needs to do it. Please advise.

## 2021-12-28 NOTE — Telephone Encounter (Signed)
Patient was calling to see if she should be wearing the 30 day event monitor. She was recently in the hospital for CVA and then went to [facility] for rehab. She just got out so has not placed her monitor on.  The patient stated that she is unable to place it by herself and will need help. She had an appointment on Monday for follow up but is unable to come due to lack of transportation.  She will call back on Monday to let us know when she can come in for a follow up appointment and then at that time we can also place the cardiac monitor on.

## 2021-12-31 ENCOUNTER — Telehealth: Payer: Self-pay | Admitting: Cardiovascular Disease

## 2021-12-31 ENCOUNTER — Ambulatory Visit: Payer: Medicare Other | Admitting: Nurse Practitioner

## 2021-12-31 DIAGNOSIS — I509 Heart failure, unspecified: Secondary | ICD-10-CM | POA: Diagnosis not present

## 2021-12-31 DIAGNOSIS — Z853 Personal history of malignant neoplasm of breast: Secondary | ICD-10-CM | POA: Diagnosis not present

## 2021-12-31 DIAGNOSIS — J9611 Chronic respiratory failure with hypoxia: Secondary | ICD-10-CM | POA: Diagnosis not present

## 2021-12-31 DIAGNOSIS — S2232XA Fracture of one rib, left side, initial encounter for closed fracture: Secondary | ICD-10-CM | POA: Diagnosis not present

## 2021-12-31 DIAGNOSIS — Z9981 Dependence on supplemental oxygen: Secondary | ICD-10-CM | POA: Diagnosis not present

## 2021-12-31 DIAGNOSIS — Z85118 Personal history of other malignant neoplasm of bronchus and lung: Secondary | ICD-10-CM | POA: Diagnosis not present

## 2021-12-31 DIAGNOSIS — N189 Chronic kidney disease, unspecified: Secondary | ICD-10-CM | POA: Diagnosis not present

## 2021-12-31 DIAGNOSIS — E039 Hypothyroidism, unspecified: Secondary | ICD-10-CM | POA: Diagnosis not present

## 2021-12-31 DIAGNOSIS — Z9181 History of falling: Secondary | ICD-10-CM | POA: Diagnosis not present

## 2021-12-31 DIAGNOSIS — Z8781 Personal history of (healed) traumatic fracture: Secondary | ICD-10-CM | POA: Diagnosis not present

## 2021-12-31 DIAGNOSIS — D631 Anemia in chronic kidney disease: Secondary | ICD-10-CM | POA: Diagnosis not present

## 2021-12-31 DIAGNOSIS — I69398 Other sequelae of cerebral infarction: Secondary | ICD-10-CM | POA: Diagnosis not present

## 2021-12-31 DIAGNOSIS — I130 Hypertensive heart and chronic kidney disease with heart failure and stage 1 through stage 4 chronic kidney disease, or unspecified chronic kidney disease: Secondary | ICD-10-CM | POA: Diagnosis not present

## 2021-12-31 DIAGNOSIS — Z7982 Long term (current) use of aspirin: Secondary | ICD-10-CM | POA: Diagnosis not present

## 2021-12-31 DIAGNOSIS — G40909 Epilepsy, unspecified, not intractable, without status epilepticus: Secondary | ICD-10-CM | POA: Diagnosis not present

## 2021-12-31 DIAGNOSIS — J449 Chronic obstructive pulmonary disease, unspecified: Secondary | ICD-10-CM | POA: Diagnosis not present

## 2021-12-31 NOTE — Telephone Encounter (Signed)
Called pt back regarding scheduling a time when she can come and get help placing her monitor. Pt asks if she can come on Thursday 6/15 in the afternoon. Pt scheduled for nurse visit at 2pm. Pt verbalizes understanding.

## 2021-12-31 NOTE — Telephone Encounter (Signed)
Patient was returning call. Please advise ?

## 2022-01-03 ENCOUNTER — Telehealth: Payer: Self-pay | Admitting: Cardiovascular Disease

## 2022-01-03 ENCOUNTER — Ambulatory Visit: Payer: Medicare Other

## 2022-01-03 NOTE — Telephone Encounter (Signed)
Patient stated she fell on her tailbone this past Tuesday evening and is a lot of pian. She rescheduled her [employer] monitor application for 1/24.

## 2022-01-03 NOTE — Telephone Encounter (Signed)
Pt called to cancel her nurse visit appt due to her falling and hurting her tailbone. She needs to reschedule.

## 2022-01-09 ENCOUNTER — Telehealth: Payer: Self-pay | Admitting: Cardiovascular Disease

## 2022-01-09 DIAGNOSIS — G40909 Epilepsy, unspecified, not intractable, without status epilepticus: Secondary | ICD-10-CM | POA: Diagnosis not present

## 2022-01-09 DIAGNOSIS — I509 Heart failure, unspecified: Secondary | ICD-10-CM | POA: Diagnosis not present

## 2022-01-09 DIAGNOSIS — I130 Hypertensive heart and chronic kidney disease with heart failure and stage 1 through stage 4 chronic kidney disease, or unspecified chronic kidney disease: Secondary | ICD-10-CM | POA: Diagnosis not present

## 2022-01-09 DIAGNOSIS — I69398 Other sequelae of cerebral infarction: Secondary | ICD-10-CM | POA: Diagnosis not present

## 2022-01-09 NOTE — Telephone Encounter (Signed)
Spoke with pt, she does not have anyone that can help her with the monitor. Follow up scheduled

## 2022-01-09 NOTE — Telephone Encounter (Signed)
Patient called and cancelled 6/22 nurse visit due to a recent fall. She states she is still in a lot of pain. Please return call to reschedule.

## 2022-01-10 ENCOUNTER — Ambulatory Visit: Payer: Medicare Other

## 2022-01-14 DIAGNOSIS — D631 Anemia in chronic kidney disease: Secondary | ICD-10-CM | POA: Diagnosis not present

## 2022-01-14 DIAGNOSIS — I69398 Other sequelae of cerebral infarction: Secondary | ICD-10-CM | POA: Diagnosis not present

## 2022-01-14 DIAGNOSIS — I509 Heart failure, unspecified: Secondary | ICD-10-CM | POA: Diagnosis not present

## 2022-01-14 DIAGNOSIS — I130 Hypertensive heart and chronic kidney disease with heart failure and stage 1 through stage 4 chronic kidney disease, or unspecified chronic kidney disease: Secondary | ICD-10-CM | POA: Diagnosis not present

## 2022-01-14 DIAGNOSIS — N189 Chronic kidney disease, unspecified: Secondary | ICD-10-CM | POA: Diagnosis not present

## 2022-01-14 DIAGNOSIS — G40909 Epilepsy, unspecified, not intractable, without status epilepticus: Secondary | ICD-10-CM | POA: Diagnosis not present

## 2022-01-15 ENCOUNTER — Telehealth: Payer: Self-pay | Admitting: Cardiovascular Disease

## 2022-01-16 ENCOUNTER — Ambulatory Visit: Payer: Medicare Other

## 2022-01-16 DIAGNOSIS — N189 Chronic kidney disease, unspecified: Secondary | ICD-10-CM | POA: Diagnosis not present

## 2022-01-16 DIAGNOSIS — I130 Hypertensive heart and chronic kidney disease with heart failure and stage 1 through stage 4 chronic kidney disease, or unspecified chronic kidney disease: Secondary | ICD-10-CM | POA: Diagnosis not present

## 2022-01-16 DIAGNOSIS — I611 Nontraumatic intracerebral hemorrhage in hemisphere, cortical: Secondary | ICD-10-CM | POA: Diagnosis not present

## 2022-01-16 DIAGNOSIS — G40909 Epilepsy, unspecified, not intractable, without status epilepticus: Secondary | ICD-10-CM | POA: Diagnosis not present

## 2022-01-16 DIAGNOSIS — I509 Heart failure, unspecified: Secondary | ICD-10-CM | POA: Diagnosis not present

## 2022-01-16 DIAGNOSIS — D631 Anemia in chronic kidney disease: Secondary | ICD-10-CM | POA: Diagnosis not present

## 2022-01-16 DIAGNOSIS — I69398 Other sequelae of cerebral infarction: Secondary | ICD-10-CM | POA: Diagnosis not present

## 2022-01-16 DIAGNOSIS — I4891 Unspecified atrial fibrillation: Secondary | ICD-10-CM | POA: Diagnosis not present

## 2022-01-24 DIAGNOSIS — I69398 Other sequelae of cerebral infarction: Secondary | ICD-10-CM | POA: Diagnosis not present

## 2022-01-24 DIAGNOSIS — D631 Anemia in chronic kidney disease: Secondary | ICD-10-CM | POA: Diagnosis not present

## 2022-01-24 DIAGNOSIS — G40909 Epilepsy, unspecified, not intractable, without status epilepticus: Secondary | ICD-10-CM | POA: Diagnosis not present

## 2022-01-24 DIAGNOSIS — I130 Hypertensive heart and chronic kidney disease with heart failure and stage 1 through stage 4 chronic kidney disease, or unspecified chronic kidney disease: Secondary | ICD-10-CM | POA: Diagnosis not present

## 2022-01-24 DIAGNOSIS — I509 Heart failure, unspecified: Secondary | ICD-10-CM | POA: Diagnosis not present

## 2022-01-24 DIAGNOSIS — N189 Chronic kidney disease, unspecified: Secondary | ICD-10-CM | POA: Diagnosis not present

## 2022-01-30 DIAGNOSIS — Z853 Personal history of malignant neoplasm of breast: Secondary | ICD-10-CM | POA: Diagnosis not present

## 2022-01-30 DIAGNOSIS — Z9181 History of falling: Secondary | ICD-10-CM | POA: Diagnosis not present

## 2022-01-30 DIAGNOSIS — I130 Hypertensive heart and chronic kidney disease with heart failure and stage 1 through stage 4 chronic kidney disease, or unspecified chronic kidney disease: Secondary | ICD-10-CM | POA: Diagnosis not present

## 2022-01-30 DIAGNOSIS — I69398 Other sequelae of cerebral infarction: Secondary | ICD-10-CM | POA: Diagnosis not present

## 2022-01-30 DIAGNOSIS — D631 Anemia in chronic kidney disease: Secondary | ICD-10-CM | POA: Diagnosis not present

## 2022-01-30 DIAGNOSIS — Z9981 Dependence on supplemental oxygen: Secondary | ICD-10-CM | POA: Diagnosis not present

## 2022-01-30 DIAGNOSIS — J449 Chronic obstructive pulmonary disease, unspecified: Secondary | ICD-10-CM | POA: Diagnosis not present

## 2022-01-30 DIAGNOSIS — G40909 Epilepsy, unspecified, not intractable, without status epilepticus: Secondary | ICD-10-CM | POA: Diagnosis not present

## 2022-01-30 DIAGNOSIS — Z85118 Personal history of other malignant neoplasm of bronchus and lung: Secondary | ICD-10-CM | POA: Diagnosis not present

## 2022-01-30 DIAGNOSIS — Z8781 Personal history of (healed) traumatic fracture: Secondary | ICD-10-CM | POA: Diagnosis not present

## 2022-01-30 DIAGNOSIS — E039 Hypothyroidism, unspecified: Secondary | ICD-10-CM | POA: Diagnosis not present

## 2022-01-30 DIAGNOSIS — I509 Heart failure, unspecified: Secondary | ICD-10-CM | POA: Diagnosis not present

## 2022-01-30 DIAGNOSIS — J9611 Chronic respiratory failure with hypoxia: Secondary | ICD-10-CM | POA: Diagnosis not present

## 2022-01-30 DIAGNOSIS — Z7982 Long term (current) use of aspirin: Secondary | ICD-10-CM | POA: Diagnosis not present

## 2022-01-30 DIAGNOSIS — N189 Chronic kidney disease, unspecified: Secondary | ICD-10-CM | POA: Diagnosis not present

## 2022-01-30 DIAGNOSIS — S2232XA Fracture of one rib, left side, initial encounter for closed fracture: Secondary | ICD-10-CM | POA: Diagnosis not present

## 2022-02-05 ENCOUNTER — Encounter: Payer: Self-pay | Admitting: *Deleted

## 2022-02-05 DIAGNOSIS — I130 Hypertensive heart and chronic kidney disease with heart failure and stage 1 through stage 4 chronic kidney disease, or unspecified chronic kidney disease: Secondary | ICD-10-CM | POA: Diagnosis not present

## 2022-02-05 DIAGNOSIS — G40909 Epilepsy, unspecified, not intractable, without status epilepticus: Secondary | ICD-10-CM | POA: Diagnosis not present

## 2022-02-05 DIAGNOSIS — D631 Anemia in chronic kidney disease: Secondary | ICD-10-CM | POA: Diagnosis not present

## 2022-02-05 DIAGNOSIS — I509 Heart failure, unspecified: Secondary | ICD-10-CM | POA: Diagnosis not present

## 2022-02-05 DIAGNOSIS — I69398 Other sequelae of cerebral infarction: Secondary | ICD-10-CM | POA: Diagnosis not present

## 2022-02-05 DIAGNOSIS — N189 Chronic kidney disease, unspecified: Secondary | ICD-10-CM | POA: Diagnosis not present

## 2022-02-05 NOTE — Progress Notes (Unsigned)
Patient ID: Kendra Watts, female   DOB: [birth year], 76 y.o.   MRN: [id number] [employer] End of Service Report posted on cardiac event monitor applied 01/16/2022.  30 day cardiac event monitor only recorded for 12 hours. Noted patient made several attempts to come to office to have monitor applied.  Contacted Evelena Asa at [employer] to have charges for monitor cancelled.  We will reach out to [name] to see if she can come to [employer] office to have a monitor from office inventory applied.  Do not recommend shipping another monitor to patient.

## 2022-02-06 ENCOUNTER — Telehealth: Payer: Self-pay | Admitting: *Deleted

## 2022-02-06 DIAGNOSIS — I509 Heart failure, unspecified: Secondary | ICD-10-CM | POA: Diagnosis not present

## 2022-02-06 DIAGNOSIS — I69398 Other sequelae of cerebral infarction: Secondary | ICD-10-CM | POA: Diagnosis not present

## 2022-02-06 DIAGNOSIS — G40909 Epilepsy, unspecified, not intractable, without status epilepticus: Secondary | ICD-10-CM | POA: Diagnosis not present

## 2022-02-06 DIAGNOSIS — N189 Chronic kidney disease, unspecified: Secondary | ICD-10-CM | POA: Diagnosis not present

## 2022-02-06 DIAGNOSIS — I130 Hypertensive heart and chronic kidney disease with heart failure and stage 1 through stage 4 chronic kidney disease, or unspecified chronic kidney disease: Secondary | ICD-10-CM | POA: Diagnosis not present

## 2022-02-06 DIAGNOSIS — D631 Anemia in chronic kidney disease: Secondary | ICD-10-CM | POA: Diagnosis not present

## 2022-02-06 NOTE — Telephone Encounter (Signed)
Monitor results returned from [place].  30 day cardiac event monitor only had 12 hours of data.  [employer] contacted and charges cancelled.  Patient states monitor fell off.  Her physical therapist had even said she did not think it would stay on.  Her O2 tubing kept catching on the monitor as well.  Patient stated she is only 90 pounds, has no muscle, and thin skin.  She did not think a replacement monitor would be successful even if applied in office. Informed patient her order would be good for a year.  If she should change her mind, give [place] a call to schedule in office application.

## 2022-02-12 DIAGNOSIS — N189 Chronic kidney disease, unspecified: Secondary | ICD-10-CM | POA: Diagnosis not present

## 2022-02-12 DIAGNOSIS — I130 Hypertensive heart and chronic kidney disease with heart failure and stage 1 through stage 4 chronic kidney disease, or unspecified chronic kidney disease: Secondary | ICD-10-CM | POA: Diagnosis not present

## 2022-02-12 DIAGNOSIS — I509 Heart failure, unspecified: Secondary | ICD-10-CM | POA: Diagnosis not present

## 2022-02-12 DIAGNOSIS — I69398 Other sequelae of cerebral infarction: Secondary | ICD-10-CM | POA: Diagnosis not present

## 2022-02-12 DIAGNOSIS — G40909 Epilepsy, unspecified, not intractable, without status epilepticus: Secondary | ICD-10-CM | POA: Diagnosis not present

## 2022-02-12 DIAGNOSIS — D631 Anemia in chronic kidney disease: Secondary | ICD-10-CM | POA: Diagnosis not present

## 2022-02-19 DIAGNOSIS — Z09 Encounter for follow-up examination after completed treatment for conditions other than malignant neoplasm: Secondary | ICD-10-CM | POA: Diagnosis not present

## 2022-02-19 DIAGNOSIS — E890 Postprocedural hypothyroidism: Secondary | ICD-10-CM | POA: Diagnosis not present

## 2022-02-19 DIAGNOSIS — R7303 Prediabetes: Secondary | ICD-10-CM | POA: Diagnosis not present

## 2022-02-19 DIAGNOSIS — Z8673 Personal history of transient ischemic attack (TIA), and cerebral infarction without residual deficits: Secondary | ICD-10-CM | POA: Diagnosis not present

## 2022-02-19 DIAGNOSIS — I10 Essential (primary) hypertension: Secondary | ICD-10-CM | POA: Diagnosis not present

## 2022-02-19 DIAGNOSIS — E782 Mixed hyperlipidemia: Secondary | ICD-10-CM | POA: Diagnosis not present

## 2022-02-19 DIAGNOSIS — Z87898 Personal history of other specified conditions: Secondary | ICD-10-CM | POA: Diagnosis not present

## 2022-02-21 DIAGNOSIS — I130 Hypertensive heart and chronic kidney disease with heart failure and stage 1 through stage 4 chronic kidney disease, or unspecified chronic kidney disease: Secondary | ICD-10-CM | POA: Diagnosis not present

## 2022-02-21 DIAGNOSIS — D631 Anemia in chronic kidney disease: Secondary | ICD-10-CM | POA: Diagnosis not present

## 2022-02-21 DIAGNOSIS — G40909 Epilepsy, unspecified, not intractable, without status epilepticus: Secondary | ICD-10-CM | POA: Diagnosis not present

## 2022-02-21 DIAGNOSIS — I69398 Other sequelae of cerebral infarction: Secondary | ICD-10-CM | POA: Diagnosis not present

## 2022-02-21 DIAGNOSIS — N189 Chronic kidney disease, unspecified: Secondary | ICD-10-CM | POA: Diagnosis not present

## 2022-02-21 DIAGNOSIS — I509 Heart failure, unspecified: Secondary | ICD-10-CM | POA: Diagnosis not present

## 2022-02-26 DIAGNOSIS — D631 Anemia in chronic kidney disease: Secondary | ICD-10-CM | POA: Diagnosis not present

## 2022-02-26 DIAGNOSIS — I130 Hypertensive heart and chronic kidney disease with heart failure and stage 1 through stage 4 chronic kidney disease, or unspecified chronic kidney disease: Secondary | ICD-10-CM | POA: Diagnosis not present

## 2022-02-26 DIAGNOSIS — I509 Heart failure, unspecified: Secondary | ICD-10-CM | POA: Diagnosis not present

## 2022-02-26 DIAGNOSIS — I69398 Other sequelae of cerebral infarction: Secondary | ICD-10-CM | POA: Diagnosis not present

## 2022-02-26 DIAGNOSIS — N189 Chronic kidney disease, unspecified: Secondary | ICD-10-CM | POA: Diagnosis not present

## 2022-02-26 DIAGNOSIS — G40909 Epilepsy, unspecified, not intractable, without status epilepticus: Secondary | ICD-10-CM | POA: Diagnosis not present

## 2022-03-01 DIAGNOSIS — I130 Hypertensive heart and chronic kidney disease with heart failure and stage 1 through stage 4 chronic kidney disease, or unspecified chronic kidney disease: Secondary | ICD-10-CM | POA: Diagnosis not present

## 2022-03-01 DIAGNOSIS — Z85118 Personal history of other malignant neoplasm of bronchus and lung: Secondary | ICD-10-CM | POA: Diagnosis not present

## 2022-03-01 DIAGNOSIS — E039 Hypothyroidism, unspecified: Secondary | ICD-10-CM | POA: Diagnosis not present

## 2022-03-01 DIAGNOSIS — Z8781 Personal history of (healed) traumatic fracture: Secondary | ICD-10-CM | POA: Diagnosis not present

## 2022-03-01 DIAGNOSIS — Z853 Personal history of malignant neoplasm of breast: Secondary | ICD-10-CM | POA: Diagnosis not present

## 2022-03-01 DIAGNOSIS — I509 Heart failure, unspecified: Secondary | ICD-10-CM | POA: Diagnosis not present

## 2022-03-01 DIAGNOSIS — N189 Chronic kidney disease, unspecified: Secondary | ICD-10-CM | POA: Diagnosis not present

## 2022-03-01 DIAGNOSIS — J9611 Chronic respiratory failure with hypoxia: Secondary | ICD-10-CM | POA: Diagnosis not present

## 2022-03-01 DIAGNOSIS — I69398 Other sequelae of cerebral infarction: Secondary | ICD-10-CM | POA: Diagnosis not present

## 2022-03-01 DIAGNOSIS — J449 Chronic obstructive pulmonary disease, unspecified: Secondary | ICD-10-CM | POA: Diagnosis not present

## 2022-03-01 DIAGNOSIS — Z9981 Dependence on supplemental oxygen: Secondary | ICD-10-CM | POA: Diagnosis not present

## 2022-03-01 DIAGNOSIS — Z9181 History of falling: Secondary | ICD-10-CM | POA: Diagnosis not present

## 2022-03-01 DIAGNOSIS — Z7982 Long term (current) use of aspirin: Secondary | ICD-10-CM | POA: Diagnosis not present

## 2022-03-01 DIAGNOSIS — G40909 Epilepsy, unspecified, not intractable, without status epilepticus: Secondary | ICD-10-CM | POA: Diagnosis not present

## 2022-03-01 DIAGNOSIS — D631 Anemia in chronic kidney disease: Secondary | ICD-10-CM | POA: Diagnosis not present

## 2022-03-05 DIAGNOSIS — G40909 Epilepsy, unspecified, not intractable, without status epilepticus: Secondary | ICD-10-CM | POA: Diagnosis not present

## 2022-03-05 DIAGNOSIS — J9611 Chronic respiratory failure with hypoxia: Secondary | ICD-10-CM | POA: Diagnosis not present

## 2022-03-05 DIAGNOSIS — I130 Hypertensive heart and chronic kidney disease with heart failure and stage 1 through stage 4 chronic kidney disease, or unspecified chronic kidney disease: Secondary | ICD-10-CM | POA: Diagnosis not present

## 2022-03-05 DIAGNOSIS — J449 Chronic obstructive pulmonary disease, unspecified: Secondary | ICD-10-CM | POA: Diagnosis not present

## 2022-03-05 DIAGNOSIS — I509 Heart failure, unspecified: Secondary | ICD-10-CM | POA: Diagnosis not present

## 2022-03-05 DIAGNOSIS — I69398 Other sequelae of cerebral infarction: Secondary | ICD-10-CM | POA: Diagnosis not present

## 2022-03-12 DIAGNOSIS — J449 Chronic obstructive pulmonary disease, unspecified: Secondary | ICD-10-CM | POA: Diagnosis not present

## 2022-03-12 DIAGNOSIS — G40909 Epilepsy, unspecified, not intractable, without status epilepticus: Secondary | ICD-10-CM | POA: Diagnosis not present

## 2022-03-12 DIAGNOSIS — I130 Hypertensive heart and chronic kidney disease with heart failure and stage 1 through stage 4 chronic kidney disease, or unspecified chronic kidney disease: Secondary | ICD-10-CM | POA: Diagnosis not present

## 2022-03-12 DIAGNOSIS — J9611 Chronic respiratory failure with hypoxia: Secondary | ICD-10-CM | POA: Diagnosis not present

## 2022-03-14 DIAGNOSIS — I130 Hypertensive heart and chronic kidney disease with heart failure and stage 1 through stage 4 chronic kidney disease, or unspecified chronic kidney disease: Secondary | ICD-10-CM | POA: Diagnosis not present

## 2022-03-14 DIAGNOSIS — J449 Chronic obstructive pulmonary disease, unspecified: Secondary | ICD-10-CM | POA: Diagnosis not present

## 2022-03-14 DIAGNOSIS — J9611 Chronic respiratory failure with hypoxia: Secondary | ICD-10-CM | POA: Diagnosis not present

## 2022-03-14 DIAGNOSIS — G40909 Epilepsy, unspecified, not intractable, without status epilepticus: Secondary | ICD-10-CM | POA: Diagnosis not present

## 2022-03-14 DIAGNOSIS — I509 Heart failure, unspecified: Secondary | ICD-10-CM | POA: Diagnosis not present

## 2022-03-14 DIAGNOSIS — I69398 Other sequelae of cerebral infarction: Secondary | ICD-10-CM | POA: Diagnosis not present

## 2022-03-20 DIAGNOSIS — I69398 Other sequelae of cerebral infarction: Secondary | ICD-10-CM | POA: Diagnosis not present

## 2022-03-20 DIAGNOSIS — J449 Chronic obstructive pulmonary disease, unspecified: Secondary | ICD-10-CM | POA: Diagnosis not present

## 2022-03-20 DIAGNOSIS — G40909 Epilepsy, unspecified, not intractable, without status epilepticus: Secondary | ICD-10-CM | POA: Diagnosis not present

## 2022-03-20 DIAGNOSIS — J9611 Chronic respiratory failure with hypoxia: Secondary | ICD-10-CM | POA: Diagnosis not present

## 2022-03-20 DIAGNOSIS — I130 Hypertensive heart and chronic kidney disease with heart failure and stage 1 through stage 4 chronic kidney disease, or unspecified chronic kidney disease: Secondary | ICD-10-CM | POA: Diagnosis not present

## 2022-03-20 DIAGNOSIS — I509 Heart failure, unspecified: Secondary | ICD-10-CM | POA: Diagnosis not present

## 2022-03-31 DIAGNOSIS — D631 Anemia in chronic kidney disease: Secondary | ICD-10-CM | POA: Diagnosis not present

## 2022-03-31 DIAGNOSIS — J449 Chronic obstructive pulmonary disease, unspecified: Secondary | ICD-10-CM | POA: Diagnosis not present

## 2022-03-31 DIAGNOSIS — N189 Chronic kidney disease, unspecified: Secondary | ICD-10-CM | POA: Diagnosis not present

## 2022-03-31 DIAGNOSIS — J9611 Chronic respiratory failure with hypoxia: Secondary | ICD-10-CM | POA: Diagnosis not present

## 2022-03-31 DIAGNOSIS — Z853 Personal history of malignant neoplasm of breast: Secondary | ICD-10-CM | POA: Diagnosis not present

## 2022-03-31 DIAGNOSIS — Z9181 History of falling: Secondary | ICD-10-CM | POA: Diagnosis not present

## 2022-03-31 DIAGNOSIS — Z8781 Personal history of (healed) traumatic fracture: Secondary | ICD-10-CM | POA: Diagnosis not present

## 2022-03-31 DIAGNOSIS — E039 Hypothyroidism, unspecified: Secondary | ICD-10-CM | POA: Diagnosis not present

## 2022-03-31 DIAGNOSIS — G40909 Epilepsy, unspecified, not intractable, without status epilepticus: Secondary | ICD-10-CM | POA: Diagnosis not present

## 2022-03-31 DIAGNOSIS — I130 Hypertensive heart and chronic kidney disease with heart failure and stage 1 through stage 4 chronic kidney disease, or unspecified chronic kidney disease: Secondary | ICD-10-CM | POA: Diagnosis not present

## 2022-03-31 DIAGNOSIS — I509 Heart failure, unspecified: Secondary | ICD-10-CM | POA: Diagnosis not present

## 2022-03-31 DIAGNOSIS — I69398 Other sequelae of cerebral infarction: Secondary | ICD-10-CM | POA: Diagnosis not present

## 2022-03-31 DIAGNOSIS — Z7982 Long term (current) use of aspirin: Secondary | ICD-10-CM | POA: Diagnosis not present

## 2022-03-31 DIAGNOSIS — Z9981 Dependence on supplemental oxygen: Secondary | ICD-10-CM | POA: Diagnosis not present

## 2022-03-31 DIAGNOSIS — Z85118 Personal history of other malignant neoplasm of bronchus and lung: Secondary | ICD-10-CM | POA: Diagnosis not present

## 2022-04-04 DIAGNOSIS — J449 Chronic obstructive pulmonary disease, unspecified: Secondary | ICD-10-CM | POA: Diagnosis not present

## 2022-04-04 DIAGNOSIS — I509 Heart failure, unspecified: Secondary | ICD-10-CM | POA: Diagnosis not present

## 2022-04-04 DIAGNOSIS — I130 Hypertensive heart and chronic kidney disease with heart failure and stage 1 through stage 4 chronic kidney disease, or unspecified chronic kidney disease: Secondary | ICD-10-CM | POA: Diagnosis not present

## 2022-04-04 DIAGNOSIS — J9611 Chronic respiratory failure with hypoxia: Secondary | ICD-10-CM | POA: Diagnosis not present

## 2022-04-04 DIAGNOSIS — G40909 Epilepsy, unspecified, not intractable, without status epilepticus: Secondary | ICD-10-CM | POA: Diagnosis not present

## 2022-04-04 DIAGNOSIS — I69398 Other sequelae of cerebral infarction: Secondary | ICD-10-CM | POA: Diagnosis not present

## 2022-05-14 ENCOUNTER — Ambulatory Visit (INDEPENDENT_AMBULATORY_CARE_PROVIDER_SITE_OTHER): Payer: Medicare Other | Admitting: Family Medicine

## 2022-05-14 ENCOUNTER — Encounter: Payer: Self-pay | Admitting: Family Medicine

## 2022-05-14 VITALS — BP 113/71 | HR 69 | Wt 81.0 lb

## 2022-05-14 DIAGNOSIS — I611 Nontraumatic intracerebral hemorrhage in hemisphere, cortical: Secondary | ICD-10-CM | POA: Diagnosis not present

## 2022-05-14 MED ORDER — LEVETIRACETAM 500 MG PO TABS: 500.0000 mg | ORAL_TABLET | Freq: Two times a day (BID) | ORAL | 1 refills | Status: DC

## 2022-05-14 NOTE — Patient Instructions (Signed)
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 Continue No antithrombotic and rosuvastatin 10mg  daily for secondary stroke prevention.  Discussed secondary stroke prevention measures and importance of close PCP follow up for aggressive stroke risk factor management. I have gone over the pathophysiology of stroke, warning signs and symptoms, risk factors and their management in some detail with instructions to go to the closest emergency room for symptoms of concern. HTN: BP goal <130/90.  Stable on metoprolol per PCP HLD: LDL goal <70. Recent LDL 58. Continue rosuvastatin per PCP.  DMII: A1c goal<7.0. Recent A1c 5.6. Continue to monitor with PCP.  Seizure: continue levetiracetam 500mg  twice daily.   Tobacco use: Consider cessation. Alcohol abuse: limit alcohol intake to 1-2 drinks per week.  Please make sure you are staying well hydrated. I recommend 50-60 ounces daily. Well balanced diet and regular exercise encouraged. Consistent sleep schedule with 6-8 hours recommended.   Please continue follow up with care team as directed.   Follow up with me in 6 months   You may receive a survey regarding today's visit. I encourage you to leave honest feed back as I do use this information to improve patient care. Thank you for seeing me today!

## 2022-05-14 NOTE — Progress Notes (Signed)
[facility] [address] [place]. [place] [address] [phone]       HOSPITAL FOLLOW UP NOTE  Ms. [name] Date of Birth:  1946-01-24 Medical Record Number:  [id number]   Reason for Referral:  hospital stroke follow up    SUBJECTIVE:   CHIEF COMPLAINT:  Chief Complaint  Patient presents with   Follow-up    Pt with friend, rm 2. Here for follow up post dc from hospital in April. She completed home health therapy and they monitored BP which was normal. Pt has overall been stable. She is on keppra 500 mg BID and she is about to run out and needs refills. She has not had any repeat imaging since being dc.    HPI:   Kendra Watts is a 76 y.o. who  has a past medical history of Anemia, Aortic arch anomaly, Breast cancer ([place]) (09/22/2015), Breast cancer of upper-outer quadrant of left female breast ([place]) (09/08/2015), Cataract, immature, CHF (congestive heart failure) ([place]), COPD (chronic obstructive pulmonary disease) ([place]), Encephalopathy acute (11/12/2021), Femur fracture, left ([place]) (04/11/2020), Heart murmur, History of cervical spine x-ray (11/13/2021), History of hyperthyroidism, History of radiation therapy, History of radiation therapy (11/28/2015), Hyperlipidemia, Hypertension, Hypokalemia, Hypothyroidism, Nonfunctioning kidney, Personal history of radiation therapy, Pulmonary nodules, Radiation (10/30/15-11/28/15), Renal artery stenosis ([place]), Tobacco abuse, and Wears partial dentures.  Patient presented on 11/12/2021 with witnessed fall and left sided shaking. Intubated by EMS. While in route to CT, she started posturing and biting ET tube. CT showed acute hemorrhage in left occipital lobe. She was started on Keppra. EEG negative. She was made DNR. She was extubated 11/16/2021. She was discharged to [facility] 11/25/2021. Personally reviewed hospitalization pertinent progress notes, lab work and imaging.  Evaluated by Dr Leonie Man and Dr Erlinda Hong.    She spent about  a month in SNF. She underwent inpatient PT/OT. She was sent home with outpatient PT/OT services for about 3 weeks. She denies any lingering physical deficits from stroke. She does feel it is harder to get her words out at times. She did not work with [facility] in rehab or outpatient. 30 day monitor attempted but would not stay connected to skin. Patient requested discontinuation and has not returned to repeat. She denies chest pain, palpitations, lightheadedness or dizziness. She is on continuous O2, 4L. She reports levetiractam 500mg  twice daily. She has never had a previous seizure. She has not driven in a couple of years. She continues to smoke about a pack per day. She drinks "a couple of beers" every night. She is not interested in stopping at this time. She had two falls about 5-6 months ago. She tripped over her oxygen tubing.    PERTINENT IMAGING/LABS  MRI: Left occipital hematoma stable.  Some diffusion changes around it.  No metastasis or other lesions noted. CTA Head and Neck: 50% stenosis of the mid right common carotid, 50% stenosis of the distal L common carotid artery 2D Echo: EF 55 to 60%   A1C Lab Results  Component Value Date   HGBA1C 5.6 11/12/2021    Lipid Panel     Component Value Date/Time   CHOL 152 11/12/2021 1825   CHOL 148 04/23/2017 0931   TRIG 74 11/12/2021 1825   HDL 79 11/12/2021 1825   HDL 92 04/23/2017 0931   CHOLHDL 1.9 11/12/2021 1825   VLDL 15 11/12/2021 1825   LDLCALC 58 11/12/2021 1825   LDLCALC 45 04/23/2017 0931   LABVLDL 11 04/23/2017 0931  ROS:   14 system review of systems performed and negative with exception of those listed in HPI  PMH:  Past Medical History:  Diagnosis Date   Anemia    Aortic arch anomaly    arteria lusoria    Breast cancer ([place]) 09/22/2015   Malignant   Breast cancer of upper-outer quadrant of left female breast ([place]) 09/08/2015   Cataract, immature    CHF (congestive heart failure) ([place])    Acute CHF-06/2018    COPD (chronic obstructive pulmonary disease) ([place])    Encephalopathy acute 11/12/2021   Femur fracture, left ([place]) 04/11/2020   left femur fracture( greater trochanter   Heart murmur    states no known problems   History of cervical spine x-ray 11/13/2021   History of hyperthyroidism    History of radiation therapy    bilateral lungs - 10/27/18-11/02/18, Dr. [name]   History of radiation therapy 11/28/2015   left breast 10/30/2015-11/28/2015   Dr [name]   Hyperlipidemia    Hypertension    states under control with meds., has been on med. x "long time"   Hypokalemia    from last physical.    Hypothyroidism    Nonfunctioning kidney    left   Personal history of radiation therapy    2017   Pulmonary nodules    Bilateral   Radiation 10/30/15-11/28/15   left breast 42.72 Gy, boosted to 10 Gy   Renal artery stenosis ([facility])    Tobacco abuse    Wears partial dentures    upper and lower    PSH:  Past Surgical History:  Procedure Laterality Date   ABDOMINAL ANGIOGRAM  02/18/2012   Procedure: ABDOMINAL ANGIOGRAM;  Surgeon: Lorretta Harp, MD;  Location: [facility] CATH LAB;  Service: Cardiovascular;;   ABDOMINAL AORTAGRAM  07/04/2014   ABDOMINAL HYSTERECTOMY  ~ 1977   partial   APPENDECTOMY     ARCH AORTOGRAM     BREAST LUMPECTOMY Left 09/22/2015   Malignant   CAROTID ANGIOGRAM N/A 02/18/2012   Procedure: CAROTID ANGIOGRAM;  Surgeon: Lorretta Harp, MD;  Location: [facility] CATH LAB;  Service: Cardiovascular;  Laterality: N/A;   ENDARTERECTOMY  04/02/2012   Procedure: ENDARTERECTOMY CAROTID;  Surgeon: Serafina Mitchell, MD;  Location: [facility];  Service: Vascular;  Laterality: Right;   FUDUCIAL PLACEMENT Bilateral 09/02/2018   Procedure: Placement Of Fiducial to right upper lobe & left upper lobe lung;  Surgeon: [name], MD;  Location: [place] [facility];  Service: Thoracic;  Laterality: Bilateral;   IR THORACENTESIS ASP PLEURAL SPACE W/IMG GUIDE  07/08/2018   RADIOACTIVE SEED GUIDED PARTIAL  MASTECTOMY WITH AXILLARY SENTINEL LYMPH NODE BIOPSY Left 09/22/2015   Procedure: INJECT BLUE DYE LEFT BREAST,RADIOACTIVE SEED GUIDED PARTIAL MASTECTOMY WITH AXILLARY SENTINEL LYMPH NODE BIOPSY;  Surgeon: Fanny Skates, MD;  Location: [facility];  Service: General;  Laterality: Left;   RENAL ANGIOGRAM Left 06/08/2010   renal artery stent -  5x12 Genesis on Aviator balloon stent (Dr. Adora Fridge)   RENAL ANGIOGRAM Right 07/04/2014   Procedure: RENAL ANGIOGRAM;  Surgeon: Lorretta Harp, MD;  Location: [facility] CATH LAB;  Service: Cardiovascular;  Laterality: Right;   RENAL ANGIOGRAM Right 08/22/2014   Procedure: RENAL ANGIOGRAM;  Surgeon: Lorretta Harp, MD;  Location: [facility] CATH LAB;  Service: Cardiovascular;  Laterality: Right;   TONSILLECTOMY     as a child   VIDEO BRONCHOSCOPY WITH ENDOBRONCHIAL NAVIGATION N/A 09/02/2018   Procedure: VIDEO BRONCHOSCOPY WITH ENDOBRONCHIAL NAVIGATION;  Surgeon: [name], MD;  Location: [facility] OR;  Service: Thoracic;  Laterality: N/A;    Social History:  Social History   Socioeconomic History   Marital status: Divorced    Spouse name: Not on file   Number of children: 0   Years of education: College   Highest education level: Bachelor's degree (e.g., BA, AB, BS)  Occupational History   Occupation: HR Technical brewer: [employer]  Tobacco Use   Smoking status: Every Day    Packs/day: 0.50    Years: 40.00    Total pack years: 20.00    Types: Cigarettes   Smokeless tobacco: Never   Tobacco comments:    15 cigarettes smoked daily ARJ 04/19/21  Vaping Use   Vaping Use: Never used  Substance and Sexual Activity   Alcohol use: Yes    Comment: occasionally   Drug use: No   Sexual activity: Never  Other Topics Concern   Not on file  Social History Narrative   Not on file   Social Determinants of Health   Financial Resource Strain: Low Risk  (06/20/2020)   Overall Financial Resource Strain (CARDIA)    Difficulty of Paying  Living Expenses: Not hard at all  Food Insecurity: No Food Insecurity (06/20/2020)   Hunger Vital Sign    Worried About Running Out of Food in the Last Year: Never true    Ran Out of Food in the Last Year: Never true  Transportation Needs: No Transportation Needs (06/20/2020)   PRAPARE - Hydrologist (Medical): No    Lack of Transportation (Non-Medical): No  Physical Activity: Insufficiently Active (06/20/2020)   Exercise Vital Sign    Days of Exercise per Week: 2 days    Minutes of Exercise per Session: 50 min  Stress: No Stress Concern Present (06/20/2020)   [place]    Feeling of Stress : Only a little  Social Connections: Moderately Isolated (06/20/2020)   Social Connection and Isolation Panel [NHANES]    Frequency of Communication with Friends and Family: More than three times a week    Frequency of Social Gatherings with Friends and Family: More than three times a week    Attends Religious Services: 1 to 4 times per year    Active Member of [employer] or Organizations: No    Attends Archivist Meetings: Never    Marital Status: Divorced  Human resources officer Violence: Not At Risk (06/20/2020)   Humiliation, Afraid, Rape, and Kick questionnaire    Fear of Current or Ex-Partner: No    Emotionally Abused: No    Physically Abused: No    Sexually Abused: No    Family History:  Family History  Problem Relation Age of Onset   Heart disease Mother        MI @ 26, died at 37   Cancer Father    Lung cancer Father    Heart disease Maternal Grandmother    Lung cancer Sister    Breast cancer Paternal Aunt     Medications:   Current Outpatient Medications on File Prior to Visit  Medication Sig Dispense Refill   albuterol (VENTOLIN HFA) 108 (90 Base) MCG/ACT inhaler Inhale 2 puffs into the lungs every 6 (six) hours as needed for wheezing or shortness of breath. 8 g 3   aspirin 81 MG  tablet Take 1 tablet (81 mg total) by mouth daily.  Budeson-Glycopyrrol-Formoterol (BREZTRI AEROSPHERE) 160-9-4.8 MCG/ACT AERO Inhale 2 puffs into the lungs in the morning and at bedtime. 10.7 g 3   doxazosin (CARDURA) 8 MG tablet Take 1 tablet by mouth daily.     levothyroxine (SYNTHROID) 88 MCG tablet Take 88 mcg by mouth daily before breakfast.     metoprolol tartrate (LOPRESSOR) 25 MG tablet Take 25 mg by mouth daily.     OXYGEN Inhale 2-4 L into the lungs as needed. As needed to maintain SATS > 90%     pantoprazole (PROTONIX) 40 MG tablet Take 1 tablet (40 mg total) by mouth daily. 30 tablet 0   rosuvastatin (CRESTOR) 10 MG tablet Take 10 mg by mouth daily.     amLODipine (NORVASC) 10 MG tablet Take 1 tablet (10 mg total) by mouth daily. (Patient not taking: Reported on 05/14/2022)     No current facility-administered medications on file prior to visit.    Allergies:  No Known Allergies   OBJECTIVE:  Physical Exam  Vitals:   05/14/22 1255  BP: 113/71  Pulse: 69  Weight: 81 lb (36.7 kg)   Body mass index is 15.82 kg/m. No results found.     06/20/2020    4:18 PM  Depression screen PHQ 2/9  Decreased Interest 0  Down, Depressed, Hopeless 0  PHQ - 2 Score 0     General: well developed, thin, generalized deconditioning, in no evident distress Head: head normocephalic and atraumatic.   Neck: supple with no carotid or supraclavicular bruits Cardiovascular: regular rate and rhythm, no murmurs Musculoskeletal: no deformity Skin:  no rash/petichiae Vascular:  Normal pulses all extremities   Neurologic Exam Mental Status: Awake and fully alert.  Fluent speech and language.  Oriented to place and time. Recent and remote memory intact. Attention span, concentration and fund of knowledge appropriate. Mood and affect appropriate.  Cranial Nerves: Fundoscopic exam reveals sharp disc margins. Pupils equal, briskly reactive to light. Extraocular movements full without  nystagmus. Visual fields full to confrontation. Hearing intact. Facial sensation intact. Face, tongue, palate moves normally and symmetrically.  Motor: Normal bulk and tone. Normal strength in all tested extremity muscles Sensory.: intact to touch , pinprick , position and vibratory sensation.  Coordination: Rapid alternating movements normal in all extremities. Finger-to-nose and heel-to-shin performed accurately bilaterally. Gait and Station: Arises from chair without difficulty. Stance is normal. Gait demonstrates normal stride length and balance with no assistive device.  Reflexes: 1+ and symmetric.    NIHSS  0 Modified Rankin  0   ASSESSMENT: Kendra ROTERT is a 76 y.o. year old female presented on 11/12/2021 with witnessed fall and left sided shaking. Vascular risk factors include HTN, HLD, tobacco and alcohol use, advanced age.    PLAN:  ICH - left occipital ICH, unusual location, concerning for possible hemorrhagic infarct  : Residual deficit: word finding difficulty. Continue No antithrombotic and rosuvastatin 10mg  daily for secondary stroke prevention.  Discussed secondary stroke prevention measures and importance of close PCP follow up for aggressive stroke risk factor management. I have gone over the pathophysiology of stroke, warning signs and symptoms, risk factors and their management in some detail with instructions to go to the closest emergency room for symptoms of concern. HTN: BP goal <130/90.  Stable on metoprolol per PCP HLD: LDL goal <70. Recent LDL 58. Continue rosuvastatin per PCP.  DMII: A1c goal<7.0. Recent A1c 5.6. Continue to monitor with PCP.  Seizure: continue levetiracetam 500mg  twice daily.   Tobacco use: Consider cessation.  Alcohol abuse: limit alcohol intake to 1-2 drinks per week.   Follow up in 6 months  or call earlier if needed   CC:  [place] provider: Dr. Leonie Man PCP: Merrilee Seashore, MD    I spent 45 minutes of face-to-face and non-face-to-face  time with patient.  This included previsit chart review including review of recent hospitalization, lab review, study review, order entry, electronic health record documentation, patient education regarding recent stroke including etiology, secondary stroke prevention measures and importance of managing stroke risk factors, residual deficits and typical recovery time and answered all other questions to patient satisfaction   Debbora Presto, [facility]  [facility] [address] [place], [place] [address]  Phone [phone] Fax [phone] Note: This document was prepared with digital dictation and possible smart phrase technology. Any transcriptional errors that result from this process are unintentional.

## 2022-07-28 DIAGNOSIS — R000 Tachycardia, unspecified: Secondary | ICD-10-CM | POA: Diagnosis not present

## 2022-07-28 DIAGNOSIS — R069 Unspecified abnormalities of breathing: Secondary | ICD-10-CM | POA: Diagnosis not present

## 2022-08-12 ENCOUNTER — Telehealth: Payer: Self-pay | Admitting: Hematology and Oncology

## 2022-08-12 NOTE — Telephone Encounter (Signed)
Cancelled appointments per patients request via telephone. Patient does not wish to reschedule the appointments at this time. Adelina Mings RN and Dr.Gudena has been made aware via staff message.

## 2022-08-16 ENCOUNTER — Other Ambulatory Visit: Payer: Self-pay | Admitting: *Deleted

## 2022-08-16 DIAGNOSIS — C3490 Malignant neoplasm of unspecified part of unspecified bronchus or lung: Secondary | ICD-10-CM

## 2022-08-19 ENCOUNTER — Other Ambulatory Visit: Payer: Medicare Other

## 2022-08-20 ENCOUNTER — Telehealth: Payer: Self-pay | Admitting: Hematology and Oncology

## 2022-08-20 NOTE — Telephone Encounter (Signed)
Scheduled appointment per staff message. Patient is aware.

## 2022-08-21 ENCOUNTER — Ambulatory Visit: Payer: Medicare Other | Admitting: Hematology and Oncology

## 2022-08-21 ENCOUNTER — Telehealth: Payer: Self-pay | Admitting: Hematology and Oncology

## 2022-08-21 NOTE — Telephone Encounter (Signed)
Changed appointment from in person to telephone due to patients request due to transportation. Patient is aware of the made changes.

## 2022-08-30 ENCOUNTER — Ambulatory Visit (HOSPITAL_COMMUNITY): Payer: Medicare Other

## 2022-09-02 NOTE — Progress Notes (Signed)
HEMATOLOGY-ONCOLOGY TELEPHONE VISIT PROGRESS NOTE  I connected with our patient on 09/02/22 at  2:00 PM EST by telephone and verified that I am speaking with the correct person using two identifiers.  I discussed the limitations, risks, security and privacy concerns of performing an evaluation and management service by telephone and the availability of in person appointments.  I also discussed with the patient that there may be a patient responsible charge related to this service. The patient expressed understanding and agreed to proceed.   History of Present Illness: Kendra Watts is a 77 y.o. with above-mentioned history of breast cancer and bilateral lung cancer. She presents to the clinic today for a telephone follow-up.   Oncology History  Breast cancer of upper-outer quadrant of left female breast ([place])  08/30/2015 Imaging   DEXA scan: Normal    09/07/2015 Initial Diagnosis   Screening detected left breast mass at 1:00 position 4 x 3 x 4 mm biopsy grade 1 invasive ductal carcinoma ER 100% PR 90% positive HER-2 negative ratio 1.18 Ki-67 10%, T1 aN0 stage IA clinical stage   09/22/2015 Surgery   Left lumpectomy [name]): IDC grade 1, 1.1 minutes, ADH, LCIS, margins negative, 0/3 lymph nodes, ER 100%, PR 90%, HER-2 negative, Ki-67 10%, T1 cN0 stage IA, Oncotype DX score 14, 9% ROR   10/30/2015 - 11/28/2015 Radiation Therapy   Adjuvant XRT ([place]). Left breast: 42.72 Gy in 16 treatments.  Left breast "boost": 10 Gy in 5 treatments.  Total: 52.72 Gy in 21 treatments    11/2015 -  Anti-estrogen oral therapy   Tamoxifen 20mg  daily. Planned duration of treatment: 5 years ([place])   Squamous cell lung cancer, left ([place])  09/10/2018 Initial Diagnosis   Squamous cell lung cancer, left ([place])   09/10/2018 Cancer Staging   Staging form: Lung, AJCC 8th Edition - Clinical stage from 09/10/2018: cT1, cN0, cM0 - Signed by Nicholas Lose, MD on 09/10/2018   10/27/2018 - 11/02/2018 Radiation Therapy    Stereotactic radiosurgery   Adenocarcinoma, lung, right ([place])  09/02/2018 Initial Diagnosis   Lung nodule evaluation of her former smoker with shortness of breath during hospitalization, PET CT, right upper lobe nodule 1.2 x 1.1 cm SUV 7.6 biopsy revealed adenocarcinoma positive for TTF-1 and Napsin A, left upper lobe nodule 2.5 x 2.1 cm SUV 9.8, no lymph node hypermetabolism, 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 and TTF-1 is negative   09/10/2018 Cancer Staging   Staging form: Lung, AJCC 8th Edition - Clinical stage from 09/10/2018: cT1, cN0, cM0 - Signed by Nicholas Lose, MD on 09/10/2018   10/27/2018 - 11/02/2018 Radiation Therapy   Stereotactic radiosurgery     REVIEW OF SYSTEMS:   Constitutional: Denies fevers, chills or abnormal weight loss All other systems were reviewed with the patient and are negative. Observations/Objective:     Assessment Plan:  No problem-specific Assessment & Plan notes found for this encounter.    I discussed the assessment and treatment plan with the patient. The patient was provided an opportunity to ask questions and all were answered. The patient agreed with the plan and demonstrated an understanding of the instructions. The patient was advised to call back or seek an in-person evaluation if the symptoms worsen or if the condition fails to improve as anticipated.   I provided *** minutes of non-face-to-face time during this encounter.  This includes time for charting and coordination of care   [place], [place] Sherlyn Hay, [employer]  am acting as a Education administrator for Dr.Vinay [employer]  ***

## 2022-09-03 ENCOUNTER — Inpatient Hospital Stay: Payer: Medicare Other | Attending: Hematology and Oncology | Admitting: Hematology and Oncology

## 2022-09-03 DIAGNOSIS — Z170 Estrogen receptor positive status [ER+]: Secondary | ICD-10-CM

## 2022-09-03 NOTE — Assessment & Plan Note (Signed)
Left lumpectomy [name]): IDC grade 1, 1.1 minutes, ADH, LCIS, margins negative, 0/3 lymph nodes, ER 100%, PR 90%, HER-2 negative, Ki-67 10%, T1 cN0 stage IA, Oncotype DX score 14, 9% ROR Adjuvant radiation therapy completed 11/28/2015 Current treatment: Tamoxifen started 12/09/2015 Tamoxifen toxicities:   Recommended discontinuation of tamoxifen therapy at this time having completed 5 years. Breast cancer surveillance:  Mammogram 12/15/2020: Benign breast density category C needs new mammo Breast exam: Left breast lymphedema causing the fluid buildup in the breast.  I encouraged her to use supportive bras.       Return to clinic in 1 year for follow-up

## 2022-09-04 ENCOUNTER — Other Ambulatory Visit: Payer: Self-pay

## 2022-09-04 MED ORDER — LEVETIRACETAM 500 MG PO TABS
500.0000 mg | ORAL_TABLET | Freq: Two times a day (BID) | ORAL | 3 refills | Status: DC
Start: 2022-09-04 — End: 2023-05-20

## 2022-09-05 ENCOUNTER — Telehealth: Payer: Self-pay | Admitting: Hematology and Oncology

## 2022-09-05 NOTE — Telephone Encounter (Signed)
Scheduled appointment per staff message. Unable to leave a voicemail due to mailbox being full.

## 2022-09-13 ENCOUNTER — Ambulatory Visit (HOSPITAL_COMMUNITY): Payer: Medicare Other

## 2022-09-16 NOTE — Progress Notes (Incomplete)
HEMATOLOGY-ONCOLOGY TELEPHONE VISIT PROGRESS NOTE  I connected with our patient on 09/16/22 at 11:15 AM EST by telephone and verified that I am speaking with the correct person using two identifiers.  I discussed the limitations, risks, security and privacy concerns of performing an evaluation and management service by telephone and the availability of in person appointments.  I also discussed with the patient that there may be a patient responsible charge related to this service. The patient expressed understanding and agreed to proceed.   History of Present Illness:   Oncology History  Breast cancer of upper-outer quadrant of left female breast ([place])  08/30/2015 Imaging   DEXA scan: Normal    09/07/2015 Initial Diagnosis   Screening detected left breast mass at 1:00 position 4 x 3 x 4 mm biopsy grade 1 invasive ductal carcinoma ER 100% PR 90% positive HER-2 negative ratio 1.18 Ki-67 10%, T1 aN0 stage IA clinical stage   09/22/2015 Surgery   Left lumpectomy [name]): IDC grade 1, 1.1 minutes, ADH, LCIS, margins negative, 0/3 lymph nodes, ER 100%, PR 90%, HER-2 negative, Ki-67 10%, T1 cN0 stage IA, Oncotype DX score 14, 9% ROR   10/30/2015 - 11/28/2015 Radiation Therapy   Adjuvant XRT ([place]). Left breast: 42.72 Gy in 16 treatments.  Left breast "boost": 10 Gy in 5 treatments.  Total: 52.72 Gy in 21 treatments    11/2015 -  Anti-estrogen oral therapy   Tamoxifen '20mg'$  daily. Planned duration of treatment: 5 years ([place])   Squamous cell lung cancer, left ([place])  09/10/2018 Initial Diagnosis   Squamous cell lung cancer, left ([place])   09/10/2018 Cancer Staging   Staging form: Lung, AJCC 8th Edition - Clinical stage from 09/10/2018: cT1, cN0, cM0 - Signed by Nicholas Lose, MD on 09/10/2018   10/27/2018 - 11/02/2018 Radiation Therapy   Stereotactic radiosurgery   Adenocarcinoma, lung, right ([place])  09/02/2018 Initial Diagnosis   Lung nodule evaluation of her former smoker with shortness of breath  during hospitalization, PET CT, right upper lobe nodule 1.2 x 1.1 cm SUV 7.6 biopsy revealed adenocarcinoma positive for TTF-1 and Napsin A, left upper lobe nodule 2.5 x 2.1 cm SUV 9.8, no lymph node hypermetabolism, 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 and TTF-1 is negative   09/10/2018 Cancer Staging   Staging form: Lung, AJCC 8th Edition - Clinical stage from 09/10/2018: cT1, cN0, cM0 - Signed by Nicholas Lose, MD on 09/10/2018   10/27/2018 - 11/02/2018 Radiation Therapy   Stereotactic radiosurgery     REVIEW OF SYSTEMS:   Constitutional: Denies fevers, chills or abnormal weight loss All other systems were reviewed with the patient and are negative. Observations/Objective:     Assessment Plan:  No problem-specific Assessment & Plan notes found for this encounter.    I discussed the assessment and treatment plan with the patient. The patient was provided an opportunity to ask questions and all were answered. The patient agreed with the plan and demonstrated an understanding of the instructions. The patient was advised to call back or seek an in-person evaluation if the symptoms worsen or if the condition fails to improve as anticipated.   I provided *** minutes of non-face-to-face time during this encounter.  This includes time for charting and coordination of care   [place], CMA  I Ammy Lienhard, Curley Hogen am acting as a Education administrator for Dr.Vinay [employer]  ***

## 2022-09-18 ENCOUNTER — Inpatient Hospital Stay: Payer: Medicare Other | Admitting: Hematology and Oncology

## 2022-09-23 ENCOUNTER — Ambulatory Visit (HOSPITAL_COMMUNITY)
Admission: RE | Admit: 2022-09-23 | Discharge: 2022-09-23 | Disposition: A | Discharge status: Home / Self Care | Payer: Medicare Other | Source: Ambulatory Visit | Attending: Hematology and Oncology | Admitting: Hematology and Oncology

## 2022-09-23 DIAGNOSIS — C3490 Malignant neoplasm of unspecified part of unspecified bronchus or lung: Secondary | ICD-10-CM | POA: Diagnosis not present

## 2022-09-23 DIAGNOSIS — J432 Centrilobular emphysema: Secondary | ICD-10-CM | POA: Diagnosis not present

## 2022-09-23 DIAGNOSIS — I10 Essential (primary) hypertension: Secondary | ICD-10-CM | POA: Diagnosis not present

## 2022-09-23 LAB — POCT I-STAT CREATININE: Creatinine, Ser: 0.9 mg/dL (ref 0.44–1.00)

## 2022-09-23 MED ORDER — SODIUM CHLORIDE (PF) 0.9 % IJ SOLN
INTRAMUSCULAR | Status: AC
  Filled 2022-09-23: qty 50

## 2022-09-23 MED ORDER — IOHEXOL 300 MG/ML  SOLN
75.0000 mL | Freq: Once | INTRAMUSCULAR | Status: AC | PRN
Start: 2022-09-23 — End: 2022-09-23
  Administered 2022-09-23: 60 mL via INTRAVENOUS

## 2022-09-24 NOTE — Progress Notes (Signed)
HEMATOLOGY-ONCOLOGY TELEPHONE VISIT PROGRESS NOTE  I connected with our patient on 09/26/22 at 12:15 PM EST by telephone and verified that I am speaking with the correct person using two identifiers.  I discussed the limitations, risks, security and privacy concerns of performing an evaluation and management service by telephone and the availability of in person appointments.  I also discussed with the patient that there may be a patient responsible charge related to this service. The patient understanding and agreed to proceed.   History of Present Illness:  Kendra Watts is a 77 y.o. with a history of breast cancer and bilateral lung cancer. She presents to the clinic for a telephone follow-up to discuss scans.  Oncology History  Breast cancer of upper-outer quadrant of left female breast ([place])  08/30/2015 Imaging   DEXA scan: Normal    09/07/2015 Initial Diagnosis   Screening detected left breast mass at 1:00 position 4 x 3 x 4 mm biopsy grade 1 invasive ductal carcinoma ER 100% PR 90% positive HER-2 negative ratio 1.18 Ki-67 10%, T1 aN0 stage IA clinical stage   09/22/2015 Surgery   Left lumpectomy [name]): IDC grade 1, 1.1 minutes, ADH, LCIS, margins negative, 0/3 lymph nodes, ER 100%, PR 90%, HER-2 negative, Ki-67 10%, T1 cN0 stage IA, Oncotype DX score 14, 9% ROR   10/30/2015 - 11/28/2015 Radiation Therapy   Adjuvant XRT ([place]). Left breast: 42.72 Gy in 16 treatments.  Left breast "boost": 10 Gy in 5 treatments.  Total: 52.72 Gy in 21 treatments    11/2015 -  Anti-estrogen oral therapy   Tamoxifen '20mg'$  daily. Planned duration of treatment: 5 years ([place])   Squamous cell lung cancer, left ([place])  09/10/2018 Initial Diagnosis   Squamous cell lung cancer, left ([place])   09/10/2018 Cancer Staging   Staging form: Lung, AJCC 8th Edition - Clinical stage from 09/10/2018: cT1, cN0, cM0 - Signed by Nicholas Lose, MD on 09/10/2018   10/27/2018 - 11/02/2018 Radiation Therapy   Stereotactic  radiosurgery   Adenocarcinoma, lung, right ([place])  09/02/2018 Initial Diagnosis   Lung nodule evaluation of her former smoker with shortness of breath during hospitalization, PET CT, right upper lobe nodule 1.2 x 1.1 cm SUV 7.6 biopsy revealed adenocarcinoma positive for TTF-1 and Napsin A, left upper lobe nodule 2.5 x 2.1 cm SUV 9.8, no lymph node hypermetabolism, 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 and TTF-1 is negative   09/10/2018 Cancer Staging   Staging form: Lung, AJCC 8th Edition - Clinical stage from 09/10/2018: cT1, cN0, cM0 - Signed by Nicholas Lose, MD on 09/10/2018   10/27/2018 - 11/02/2018 Radiation Therapy   Stereotactic radiosurgery     REVIEW OF SYSTEMS:   Constitutional: Denies fevers, chills or abnormal weight loss All other systems were reviewed with the patient and are negative. Observations/Objective:     Assessment Plan:  Squamous cell lung cancer, left ([place]) 09/02/2018: Lung nodule evaluation of her former smoker with shortness of breath during hospitalization,  PET CT,  right upper lobe nodule 1.2 x 1.1 cm SUV 7.6 biopsy revealed adenocarcinoma positive for TTF-1 and Napsin A,  left upper lobe nodule 2.5 x 2.1 cm SUV 9.8, no lymph node hypermetabolism, 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 and TTF-1 is negative   Stereotactic radiosurgery April 2020 CT chest 07/28/2021: Stable postradiation fibrosis.  Aortic atherosclerosis.  Emphysema, severe atrophy of left kidney CT chest 09/24/2022: New 9 mm nodule superior segment of left  lower lobe, slight enlargement of small solid nodule medially in the right lower lobe,, T3 compression fracture  Recommendation: I sent a message to Dr. Lamonte Sakai to help [place] with coming up with a plan.  PET CT now versus CT chest in 3 months for follow-up.    I discussed the assessment and treatment plan with the patient. The patient was provided an opportunity to ask questions and all were  answered. The patient agreed with the plan and demonstrated an understanding of the instructions. The patient was advised to call back or seek an in-person evaluation if the symptoms worsen or if the condition fails to improve as anticipated.   I provided 12 minutes of non-face-to-face time during this encounter.  This includes time for charting and coordination of care   Harriette Ohara, MD  I Gardiner Coins am acting as a scribe for Dr.Vinay Gudena  I have reviewed the above documentation for accuracy and completeness, and I agree with the above.

## 2022-09-25 ENCOUNTER — Telehealth: Payer: Self-pay | Admitting: Hematology and Oncology

## 2022-09-25 NOTE — Telephone Encounter (Signed)
Called patient in regards to staff message. Reminded patient about her upcoming appointment 3/7. Reminded patient that she does not need to come into the office due to this being a phone visit scheduled at 12:15. Left voicemail.

## 2022-09-26 ENCOUNTER — Telehealth: Payer: Self-pay

## 2022-09-26 ENCOUNTER — Inpatient Hospital Stay: Payer: Medicare Other | Attending: Hematology and Oncology | Admitting: Hematology and Oncology

## 2022-09-26 DIAGNOSIS — C3492 Malignant neoplasm of unspecified part of left bronchus or lung: Secondary | ICD-10-CM | POA: Diagnosis not present

## 2022-09-26 NOTE — Assessment & Plan Note (Signed)
09/02/2018: Lung nodule evaluation of her former smoker with shortness of breath during hospitalization,  PET CT,  right upper lobe nodule 1.2 x 1.1 cm SUV 7.6 biopsy revealed adenocarcinoma positive for TTF-1 and Napsin A,  left upper lobe nodule 2.5 x 2.1 cm SUV 9.8, no lymph node hypermetabolism, 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 and TTF-1 is negative   Stereotactic radiosurgery April 2020 CT chest 07/28/2021: Stable postradiation fibrosis.  Aortic atherosclerosis.  Emphysema, severe atrophy of left kidney CT chest 09/24/2022: New 9 mm nodule superior segment of left lower lobe, slight enlargement of small solid nodule medially in the right lower lobe,, T3 compression fracture  Recommendation: Refer patient to pulmonary.  Plan to perform a 20-monthfollow-up CT scan versus PET/CT scan

## 2022-09-26 NOTE — Telephone Encounter (Signed)
Received following staff message from Dr. Lamonte Sakai.   [name], MD  Dierdre Highman, RN Please order a SuperD Ct chest for her in 3 months. And then an OV w me to review Thanks, RB       Previous Messages    ----- Message ----- From: Nicholas Lose, MD Sent: 09/26/2022  12:51 PM EST To: [name], MD Subject: New lung nodule                                Dr.Byrum, She had a previous Squamous cell lung ca and now has a CT chest showing a new lung nodule. She sees you. Can you please look at the scan and recommend if you suggest PET CT or a CT chest in 3 months? Thanks Vinay   ATC LVMTCB x 1

## 2022-09-27 ENCOUNTER — Other Ambulatory Visit: Payer: Self-pay

## 2022-09-27 DIAGNOSIS — R911 Solitary pulmonary nodule: Secondary | ICD-10-CM

## 2022-09-27 NOTE — Telephone Encounter (Signed)
Called and spoke w/ pt she verbalized understanding. I have ordered SupD per Dr.Byrum and put in a 25morecall as well.

## 2022-09-27 NOTE — Telephone Encounter (Signed)
Pls ret call to this PT. She missed our call back. Her # is [phone]

## 2022-10-09 ENCOUNTER — Telehealth: Payer: Self-pay

## 2022-10-09 NOTE — Telephone Encounter (Signed)
   FW: New lung nodule Received: 1 week ago Byrum, Rose Fillers, MD  Dierdre Highman, RN Please order a SuperD Ct chest for her in 3 months. And then an OV w me to review Thanks, RB       Previous Messages    ----- Message ----- From: Nicholas Lose, MD Sent: 09/26/2022  12:51 PM EST To: [name], MD Subject: New lung nodule                                Dr.Byrum, She had a previous Squamous cell lung ca and now has a CT chest showing a new lung nodule. She sees you. Can you please look at the scan and recommend if you suggest PET CT or a CT chest in 3 months? Thanks Vinay  ATC LVMTCB x 1

## 2022-10-10 NOTE — Telephone Encounter (Signed)
Called and spoke to patient and let her know that an order was placed for a new CT scan for 3 months and for her to follow up with RB after to go over results. She verbalized understanding. Nothing further needed

## 2022-10-10 NOTE — Telephone Encounter (Signed)
PT calling saying she is returning a nurses call. Pls call back to advise @ [phone]

## 2022-11-14 NOTE — Patient Instructions (Addendum)
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 Continue No antithrombotic and rosuvastatin 10mg  daily for secondary stroke prevention.  Discussed secondary stroke prevention measures and importance of close PCP follow up for aggressive stroke risk factor management. I have gone over the pathophysiology of stroke, warning signs and symptoms, risk factors and their management in some detail with instructions to go to the closest emergency room for symptoms of concern. HTN: BP goal <130/90.  Stable on metoprolol per PCP HLD: LDL goal <70. Recent LDL 58. Continue rosuvastatin per PCP.  DMII: A1c goal<7.0. Recent A1c 5.6. Continue to monitor with PCP.  Seizure: continue levetiracetam 500mg  twice daily.   Tobacco use: Consider cessation. Alcohol abuse: limit alcohol intake to 1-2 drinks per week.   We will continue levetiracetam 500mg  twice daily. Please follow up with Dr Allyson Sabal with cardiology about loop recorder placement if you wish to pursue workup. Please call Dr Kavin Leech office to schedule follow up for the new lung nodule. Oncology recommended CT scan in 3 months.   Please make sure you are staying well hydrated. I recommend 50-60 ounces daily. Well balanced diet and regular exercise encouraged. Consistent sleep schedule with 6-8 hours recommended.   Please continue follow up with care team as directed.   Follow up with me in 6 months   You may receive a survey regarding today's visit. I encourage you to leave honest feed back as I do use this information to improve patient care. Thank you for seeing me today!

## 2022-11-14 NOTE — Progress Notes (Signed)
[facility] [address] [place]. [place] [address] [phone]       HOSPITAL FOLLOW UP NOTE  [name] Date of Birth:  1945-09-13 Medical Record Number:  [id number]   Reason for Referral:  hospital stroke follow up    SUBJECTIVE:   CHIEF COMPLAINT:  Chief Complaint  Patient presents with   Room 11    Pt is here with her Friend.Pt states that she is having problems with her. Pt's states that pt has a delay with words and speaking. Pt states that she can think of the word and it have a hard time putting everything together.     HPI:   Kendra Watts is a 77 y.o. who  has a past medical history of Anemia, Aortic arch anomaly, Breast cancer (HCC) (09/22/2015), Breast cancer of upper-outer quadrant of left female breast (HCC) (09/08/2015), Cataract, immature, CHF (congestive heart failure) (HCC), COPD (chronic obstructive pulmonary disease) (HCC), Encephalopathy acute (11/12/2021), Femur fracture, left (HCC) (04/11/2020), Heart murmur, History of cervical spine x-ray (11/13/2021), History of hyperthyroidism, History of radiation therapy, History of radiation therapy (11/28/2015), Hyperlipidemia, Hypertension, Hypokalemia, Hypothyroidism, Nonfunctioning kidney, Personal history of radiation therapy, Pulmonary nodules, Radiation (10/30/15-11/28/15), Renal artery stenosis (HCC), Tobacco abuse, and Wears partial dentures.  Patient presented on 11/12/2021 with witnessed fall and left sided shaking. Intubated by EMS. While in route to CT, she started posturing and biting ET tube. CT showed acute hemorrhage in left occipital lobe. She was started on Keppra. EEG negative. She was made DNR. She was extubated 11/16/2021. She was discharged to [facility] 11/25/2021. Personally reviewed hospitalization pertinent progress notes, lab work and imaging.  Evaluated by Dr Pearlean Brownie and Dr Roda Shutters.   She spent about a month in SNF. She underwent inpatient PT/OT. She was sent home with  outpatient PT/OT services for about 3 weeks. She denies any lingering physical deficits from stroke. She does feel it is harder to get her words out at times. She did not work with ST in rehab or outpatient. 30 day monitor attempted but would not stay connected to skin. Patient requested discontinuation and has not returned to repeat. She denies chest pain, palpitations, lightheadedness or dizziness. She is on continuous O2, 4L. She reports levetiractam 500mg  twice daily. She has never had a previous seizure. She has not driven in a couple of years. She continues to smoke about a pack per day. She drinks "a couple of beers" every night. She is not interested in stopping at this time. She had two falls about 5-6 months ago. She tripped over her oxygen tubing.   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 She presents with her cousin, Kendra Watts, who aids in history. She continues levetiracetam 500mg  BID and seems to be tolerating it fairly well. No obvious seizure activity. She tells me of an event about 3 months ago where she was more confused than normal for a few days. EMS was called but she refused eval. Family feels she was dehydrated. She continues to drink "a few beers" every night. Usually 3-4. She is smoking 1/2-1ppd. She does have days where she seems sharper than others. She continues to live alone. She does not drive. She denies any recent falls. She has a Museum/gallery exhibitions officer but doesn't use around the home. She remains on 3L O2. She is followed closely by PCP and pulmonology. She was seen by Dr Pamelia Hoit 09/26/2022 for new 9mm left lung nodule. She was advised to repeat  CT scan and follow up with Dr Delton Coombes in 3 months. No appts scheduled. Kendra Watts is her best friend and who she wishes to help her with appt scheduling [phone].    PERTINENT IMAGING/LABS  MRI: Left occipital hematoma stable.  Some diffusion changes around it.  No metastasis or other lesions noted. CTA Head and Neck:  50% stenosis of the mid right common carotid, 50% stenosis of the distal L common carotid artery 2D Echo: EF 55 to 60%   A1C Lab Results  Component Value Date   HGBA1C 5.6 11/12/2021    Lipid Panel     Component Value Date/Time   CHOL 152 11/12/2021 1825   CHOL 148 04/23/2017 0931   TRIG 74 11/12/2021 1825   HDL 79 11/12/2021 1825   HDL 92 04/23/2017 0931   CHOLHDL 1.9 11/12/2021 1825   VLDL 15 11/12/2021 1825   LDLCALC 58 11/12/2021 1825   LDLCALC 45 04/23/2017 0931   LABVLDL 11 04/23/2017 0931    ROS:   14 system review of systems performed and negative with exception of those listed in HPI  PMH:  Past Medical History:  Diagnosis Date   Anemia    Aortic arch anomaly    arteria lusoria    Breast cancer (HCC) 09/22/2015   Malignant   Breast cancer of upper-outer quadrant of left female breast (HCC) 09/08/2015   Cataract, immature    CHF (congestive heart failure) (HCC)    Acute CHF-06/2018   COPD (chronic obstructive pulmonary disease) (HCC)    Encephalopathy acute 11/12/2021   Femur fracture, left (HCC) 04/11/2020   left femur fracture( greater trochanter   Heart murmur    states no known problems   History of cervical spine x-ray 11/13/2021   History of hyperthyroidism    History of radiation therapy    bilateral lungs - 10/27/18-11/02/18, Dr. Antony Blackbird   History of radiation therapy 11/28/2015   left breast 10/30/2015-11/28/2015   Dr Antony Blackbird   Hyperlipidemia    Hypertension    states under control with meds., has been on med. x "long time"   Hypokalemia    from last physical.    Hypothyroidism    Nonfunctioning kidney    left   Personal history of radiation therapy    2017   Pulmonary nodules    Bilateral   Radiation 10/30/15-11/28/15   left breast 42.72 Gy, boosted to 10 Gy   Renal artery stenosis (HCC)    Tobacco abuse    Wears partial dentures    upper and lower    PSH:  Past Surgical History:  Procedure Laterality Date   ABDOMINAL  ANGIOGRAM  02/18/2012   Procedure: ABDOMINAL ANGIOGRAM;  Surgeon: Runell Gess, MD;  Location: [facility] CATH LAB;  Service: Cardiovascular;;   ABDOMINAL AORTAGRAM  07/04/2014   ABDOMINAL HYSTERECTOMY  ~ 1977   partial   APPENDECTOMY     ARCH AORTOGRAM     BREAST LUMPECTOMY Left 09/22/2015   Malignant   CAROTID ANGIOGRAM N/A 02/18/2012   Procedure: CAROTID ANGIOGRAM;  Surgeon: Runell Gess, MD;  Location: [facility] CATH LAB;  Service: Cardiovascular;  Laterality: N/A;   ENDARTERECTOMY  04/02/2012   Procedure: ENDARTERECTOMY CAROTID;  Surgeon: Nada Libman, MD;  Location: [facility] OR;  Service: Vascular;  Laterality: Right;   FUDUCIAL PLACEMENT Bilateral 09/02/2018   Procedure: Placement Of Fiducial to right upper lobe & left upper lobe lung;  Surgeon: Leslye Peer, MD;  Location: [facility] OR;  Service: Thoracic;  Laterality: Bilateral;   IR THORACENTESIS ASP PLEURAL SPACE W/IMG GUIDE  07/08/2018   RADIOACTIVE SEED GUIDED PARTIAL MASTECTOMY WITH AXILLARY SENTINEL LYMPH NODE BIOPSY Left 09/22/2015   Procedure: INJECT BLUE DYE LEFT BREAST,RADIOACTIVE SEED GUIDED PARTIAL MASTECTOMY WITH AXILLARY SENTINEL LYMPH NODE BIOPSY;  Surgeon: Claud Kelp, MD;  Location:  SURGERY CENTER;  Service: General;  Laterality: Left;   RENAL ANGIOGRAM Left 06/08/2010   renal artery stent -  5x12 Genesis on Aviator balloon stent (Dr. Erlene Quan)   RENAL ANGIOGRAM Right 07/04/2014   Procedure: RENAL ANGIOGRAM;  Surgeon: Runell Gess, MD;  Location: [facility] CATH LAB;  Service: Cardiovascular;  Laterality: Right;   RENAL ANGIOGRAM Right 08/22/2014   Procedure: RENAL ANGIOGRAM;  Surgeon: Runell Gess, MD;  Location: [facility] CATH LAB;  Service: Cardiovascular;  Laterality: Right;   TONSILLECTOMY     as a child   VIDEO BRONCHOSCOPY WITH ENDOBRONCHIAL NAVIGATION N/A 09/02/2018   Procedure: VIDEO BRONCHOSCOPY WITH ENDOBRONCHIAL NAVIGATION;  Surgeon: Leslye Peer, MD;  Location: [facility] OR;  Service: Thoracic;  Laterality: N/A;     Social History:  Social History   Socioeconomic History   Marital status: Divorced    Spouse name: Not on file   Number of children: 0   Years of education: College   Highest education level: Bachelor's degree (e.g., BA, AB, BS)  Occupational History   Occupation: HR Sales promotion account executive: [employer]  Tobacco Use   Smoking status: Every Day    Packs/day: 0.50    Years: 40.00    Additional pack years: 0.00    Total pack years: 20.00    Types: Cigarettes   Smokeless tobacco: Never   Tobacco comments:    15 cigarettes smoked daily ARJ 04/19/21  Vaping Use   Vaping Use: Never used  Substance and Sexual Activity   Alcohol use: Yes    Comment: occasionally   Drug use: No   Sexual activity: Never  Other Topics Concern   Not on file  Social History Narrative   Left Handed    1 Cup of Coffee per Day   Social Determinants of Health   Financial Resource Strain: Low Risk  (06/20/2020)   Overall Financial Resource Strain (CARDIA)    Difficulty of Paying Living Expenses: Not hard at all  Food Insecurity: No Food Insecurity (06/20/2020)   Hunger Vital Sign    Worried About Running Out of Food in the Last Year: Never true    Ran Out of Food in the Last Year: Never true  Transportation Needs: No Transportation Needs (06/20/2020)   PRAPARE - Administrator, Civil Service (Medical): No    Lack of Transportation (Non-Medical): No  Physical Activity: Insufficiently Active (06/20/2020)   Exercise Vital Sign    Days of Exercise per Week: 2 days    Minutes of Exercise per Session: 50 min  Stress: No Stress Concern Present (06/20/2020)   [employer] of Occupational Health - Occupational Stress Questionnaire    Feeling of Stress : Only a little  Social Connections: Moderately Isolated (06/20/2020)   Social Connection and Isolation Panel [NHANES]    Frequency of Communication with Friends and Family: More than three times a week    Frequency of Social  Gatherings with Friends and Family: More than three times a week    Attends Religious Services: 1 to 4 times per year    Active Member of [employer] or Organizations: No    Attends Club or  Organization Meetings: Never    Marital Status: Divorced  Catering manager Violence: Not At Risk (06/20/2020)   Humiliation, Afraid, Rape, and Kick questionnaire    Fear of Current or Ex-Partner: No    Emotionally Abused: No    Physically Abused: No    Sexually Abused: No    Family History:  Family History  Problem Relation Age of Onset   Heart disease Mother        MI @ 86, died at 32   Cancer Father    Lung cancer Father    Heart disease Maternal Grandmother    Lung cancer Sister    Breast cancer Paternal Aunt     Medications:   Current Outpatient Medications on File Prior to Visit  Medication Sig Dispense Refill   albuterol (VENTOLIN HFA) 108 (90 Base) MCG/ACT inhaler Inhale 2 puffs into the lungs every 6 (six) hours as needed for wheezing or shortness of breath. 8 g 3   aspirin 81 MG tablet Take 1 tablet (81 mg total) by mouth daily.     Budeson-Glycopyrrol-Formoterol (BREZTRI AEROSPHERE) 160-9-4.8 MCG/ACT AERO Inhale 2 puffs into the lungs in the morning and at bedtime. 10.7 g 3   doxazosin (CARDURA) 8 MG tablet Take 1 tablet by mouth daily.     levETIRAcetam (KEPPRA) 500 MG tablet Take 1 tablet (500 mg total) by mouth 2 (two) times daily. 180 tablet 3   levothyroxine (SYNTHROID) 88 MCG tablet Take 88 mcg by mouth daily before breakfast.     metoprolol tartrate (LOPRESSOR) 25 MG tablet Take 25 mg by mouth daily.     OXYGEN Inhale 2-4 L into the lungs as needed. As needed to maintain SATS > 90%     pantoprazole (PROTONIX) 40 MG tablet Take 1 tablet (40 mg total) by mouth daily. 30 tablet 0   rosuvastatin (CRESTOR) 10 MG tablet Take 10 mg by mouth daily.     amLODipine (NORVASC) 10 MG tablet Take 1 tablet (10 mg total) by mouth daily. (Patient not taking: Reported on 05/14/2022)     No current  facility-administered medications on file prior to visit.    Allergies:  No Known Allergies   OBJECTIVE:  Physical Exam  Vitals:   11/18/22 1353  BP: 135/77  Pulse: 72  Weight: 81 lb 8 oz (37 kg)  Height: 5' (1.524 m)    Body mass index is 15.92 kg/m. No results found.     06/20/2020    4:18 PM  Depression screen PHQ 2/9  Decreased Interest 0  Down, Depressed, Hopeless 0  PHQ - 2 Score 0     General: well developed, thin, generalized deconditioning, in no evident distress Head: head normocephalic and atraumatic.   Neck: supple with no carotid or supraclavicular bruits Cardiovascular: regular rate and rhythm, no murmurs Musculoskeletal: no deformity Skin:  no rash/petichiae Vascular:  Normal pulses all extremities   Neurologic Exam Mental Status: Awake and fully alert.  Fluent speech and language.  Oriented to place and time. Recent and remote memory intact. Attention span, concentration and fund of knowledge appropriate. Mood and affect appropriate.  Cranial Nerves: Fundoscopic exam reveals sharp disc margins. Pupils equal, briskly reactive to light. Extraocular movements full without nystagmus. Visual fields full to confrontation. Hearing intact. Facial sensation intact. Face, tongue, palate moves normally and symmetrically.  Motor: Normal bulk and tone. Normal strength in all tested extremity muscles Sensory.: intact to touch , pinprick , position and vibratory sensation.  Coordination: Rapid alternating movements normal  in all extremities. Finger-to-nose and heel-to-shin performed accurately bilaterally. Gait and Station: Arises from chair without difficulty. Stance is normal. Gait demonstrates normal stride length and slightly impaired balance with no assistive device.  Reflexes: 1+ and symmetric.    NIHSS  0 Modified Rankin  1   ASSESSMENT: CEYLIN DREIBELBIS is a 77 y.o. year old female presented on 11/12/2021 with witnessed fall and left sided shaking. Vascular  risk factors include HTN, HLD, tobacco and alcohol use, advanced age.    PLAN:  ICH - left occipital ICH, unusual location, concerning for possible hemorrhagic infarct  : Residual deficit: word finding difficulty. Continue No antithrombotic and rosuvastatin 10mg  daily for secondary stroke prevention.  Discussed secondary stroke prevention measures and importance of close PCP follow up for aggressive stroke risk factor management. I have gone over the pathophysiology of stroke, warning signs and symptoms, risk factors and their management in some detail with instructions to go to the closest emergency room for symptoms of concern. HTN: BP goal <130/90.  Stable on metoprolol per PCP HLD: LDL goal <70. Recent LDL 58. Continue rosuvastatin per PCP.  DMII: A1c goal<7.0. Recent A1c 5.6. Continue to monitor with PCP.  Seizure: continue levetiracetam 500mg  twice daily.   Tobacco use: Consider cessation. Alcohol abuse: limit alcohol intake to 1-2 drinks per week. Pulmonary nodule: please follow up closely with pulmonology and oncology. Repeat CT scan in 3 months.    Follow up in 6 months  or call earlier if needed   CC:  [facility] provider: Dr. Pearlean Brownie PCP: Georgianne Fick, MD    I spent 30 minutes of face-to-face and non-face-to-face time with patient.  This included previsit chart review including review of recent hospitalization, lab review, study review, order entry, electronic health record documentation, patient education regarding recent stroke including etiology, secondary stroke prevention measures and importance of managing stroke risk factors, residual deficits and typical recovery time and answered all other questions to patient satisfaction   Shawnie Dapper, [facility]  [facility] [address] [place], [place] [address]  Phone [phone] Fax [phone] Note: This document was prepared with digital dictation and possible smart phrase technology. Any  transcriptional errors that result from this process are unintentional.

## 2022-11-18 ENCOUNTER — Ambulatory Visit (INDEPENDENT_AMBULATORY_CARE_PROVIDER_SITE_OTHER): Payer: Medicare Other | Admitting: Family Medicine

## 2022-11-18 ENCOUNTER — Encounter: Payer: Self-pay | Admitting: Family Medicine

## 2022-11-18 VITALS — BP 135/77 | HR 72 | Ht 60.0 in | Wt 81.5 lb

## 2022-11-18 DIAGNOSIS — I611 Nontraumatic intracerebral hemorrhage in hemisphere, cortical: Secondary | ICD-10-CM

## 2022-12-17 ENCOUNTER — Other Ambulatory Visit: Payer: Self-pay | Admitting: Emergency Medicine

## 2022-12-18 ENCOUNTER — Telehealth: Payer: Self-pay | Admitting: Emergency Medicine

## 2022-12-18 NOTE — Telephone Encounter (Signed)
Called and spoke with patient. She stated that she has been having a burning and stinging sensation in her lungs for the past few weeks. The sensation originally started in her left lung but recently spread to the right lung. Denied any numbness, tingling of her arms or jaw. The pain just randomally comes on, she has not noticed a schedule yet. Also denied any chest pain or fever. She has noticed a slight increase in her SOB. She confirmed that she is on 3L of O2.   She is still using her Breztri twice daily and albuterol as needed. She has not used the albuterol in months. She has not noticed that the symptoms decrease or increase after using her inhalers.   She is scheduled for a CT scan on 06/05.   RB, can you please advise? Thanks!

## 2022-12-18 NOTE — Telephone Encounter (Signed)
Called patient to discuss her symptoms.  Difficult for her to describe, but it sounds like she has been having some off-and-on longstanding burning.  She is unable to clarify whether this is pain more superficially, possibly GERD.  It has been present for several months.  I offered her an appointment June 13.  She has an office visit already in July.  She wants to keep the later visit because better for her scheduling and her transportation.  She is going to get a CT chest to follow a new nodule on 12/25/2022.  We can review that scan in July.  If she has an escalation of her chest discomfort then we will need to have her seen here or send her to urgent care.

## 2022-12-18 NOTE — Telephone Encounter (Signed)
patient called to schedule OV for after CT and states that she would like a call from the office. She has new onset burning in both lungs and finds it hard to breathe. It started in left lung and has today spread to right.

## 2022-12-25 ENCOUNTER — Ambulatory Visit (HOSPITAL_BASED_OUTPATIENT_CLINIC_OR_DEPARTMENT_OTHER): Payer: Medicare Other

## 2023-01-01 ENCOUNTER — Ambulatory Visit: Payer: Medicare Other | Admitting: Emergency Medicine

## 2023-01-03 ENCOUNTER — Ambulatory Visit: Payer: Medicare Other | Admitting: Emergency Medicine

## 2023-01-15 ENCOUNTER — Ambulatory Visit (HOSPITAL_BASED_OUTPATIENT_CLINIC_OR_DEPARTMENT_OTHER)
Admission: RE | Admit: 2023-01-15 | Discharge: 2023-01-15 | Disposition: A | Discharge status: Home / Self Care | Payer: Medicare Other | Source: Ambulatory Visit | Attending: Emergency Medicine | Admitting: Emergency Medicine

## 2023-01-15 DIAGNOSIS — J8410 Pulmonary fibrosis, unspecified: Secondary | ICD-10-CM | POA: Diagnosis not present

## 2023-01-15 DIAGNOSIS — R911 Solitary pulmonary nodule: Secondary | ICD-10-CM | POA: Insufficient documentation

## 2023-01-15 DIAGNOSIS — C3490 Malignant neoplasm of unspecified part of unspecified bronchus or lung: Secondary | ICD-10-CM | POA: Diagnosis not present

## 2023-01-20 ENCOUNTER — Telehealth: Payer: Self-pay | Admitting: Emergency Medicine

## 2023-01-20 NOTE — Telephone Encounter (Signed)
Received call from [facility] for patient's call report:   IMPRESSION: 1. Irregular solid 0.9 cm superior segment left lower lobe pulmonary nodule, mildly increased in size since 09/23/2022 chest CT, suspicious for malignancy, either metachronous primary bronchogenic malignancy versus pulmonary metastasis. 2. Tiny 0.5 cm solid posterior right lower lobe pulmonary nodule, stable size but appearing slightly increased in density, indeterminate for metastasis. Close chest CT follow-up advised in 3 months. 3. No thoracic adenopathy. 4. 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 5. Three-vessel coronary atherosclerosis. 6. Dilated main pulmonary artery, suggesting pulmonary arterial hypertension. 7. Chronic severe T3 vertebral compression fracture. 8.  Aortic Atherosclerosis (ICD10-I70.0).  Will route to RB as a FYI. Patient is scheduled for a F/U with RB on 7/10.

## 2023-01-20 NOTE — Telephone Encounter (Signed)
[facility]J calling with call report. For CT scan. [facility] phone number is [phone].

## 2023-01-22 NOTE — Telephone Encounter (Signed)
Thank you :)

## 2023-01-29 ENCOUNTER — Ambulatory Visit: Payer: Medicare Other | Admitting: Emergency Medicine

## 2023-01-29 ENCOUNTER — Telehealth: Payer: Self-pay | Admitting: Emergency Medicine

## 2023-01-29 DIAGNOSIS — J9611 Chronic respiratory failure with hypoxia: Secondary | ICD-10-CM

## 2023-01-29 DIAGNOSIS — R911 Solitary pulmonary nodule: Secondary | ICD-10-CM

## 2023-01-29 NOTE — Telephone Encounter (Signed)
Pt calling in for CT Results

## 2023-01-30 NOTE — Telephone Encounter (Signed)
PT calling again for CT results. Please try to contact to review. Her [phone]

## 2023-01-31 NOTE — Telephone Encounter (Signed)
PT calling again for CT results.

## 2023-02-03 NOTE — Telephone Encounter (Signed)
 Dr. Lamonte Sakai please advise on CT results.

## 2023-02-04 NOTE — Telephone Encounter (Signed)
Pt calling back for results

## 2023-02-05 NOTE — Telephone Encounter (Signed)
Please let the patient know that her CT scan of the chest is overall stable but one of her pulmonary nodules may be slightly increased in size.  I would like to see her in the office to discuss this in more detail.  This will allow [place] to review the scan and decide whether any other evaluation will be helpful.

## 2023-02-06 NOTE — Telephone Encounter (Signed)
Pt calling in bc she will like to get her CT results

## 2023-02-07 NOTE — Telephone Encounter (Signed)
Called and spoke with patient. Dr. Kavin Leech CT results and recommendations given. Understanding stated.  Offered to schedule follow up OV, but patient stated she would have to call back to schedule, because she has someone that brings her to appointments, and she has to let her schedule around work. Nothing further at this time.

## 2023-02-12 NOTE — Telephone Encounter (Signed)
Received call from patient. Patient stated she is having trouble getting transportation to office for OV with Dr. Delton Coombes to review CT results/recommendations.  I have offered a my chart visit, but patient is unable. Patient is asking if Dr. Delton Coombes could call her at his convenience to explain CT and give his recommendations. Patient is aware Dr. Delton Coombes is not in office this week.    Message routed to Dr. Delton Coombes to advise

## 2023-02-13 NOTE — Telephone Encounter (Signed)
Order for POC placed.  

## 2023-02-13 NOTE — Addendum Note (Signed)
Addended by: Maurene Capes on: 02/13/2023 04:50 PM   Modules accepted: Orders

## 2023-02-13 NOTE — Telephone Encounter (Signed)
Attempted to call the patient to both available numbers.  No answer.  Left a voicemail and will have to call her back.

## 2023-02-13 NOTE — Addendum Note (Signed)
Addended by: Leslye Peer on: 02/13/2023 04:39 PM   Modules accepted: Orders

## 2023-02-13 NOTE — Telephone Encounter (Signed)
I called the patient and reviewed her CT scan with her today.  She has a lot of difficulty with transportation, has not been able to come to the office to discuss.  Similarly if we wanted to perform bronchoscopy am not sure if she would have the social support to get to the hospital and have someone with her after the procedure.  We talked about watchful waiting versus navigational bronchoscopy.  In the end she is decided that she wants to have a repeat CT chest.  I will order this for 6 months.  Will need to see each other after that study to determine whether we should perform bronchoscopy.  Patient also mentioned to me that she has a pulsed flow tank that is too heavy and wants to get a portable concentrator.  She works with Production designer, theatre/television/film.  Typically she would have to come in for a walking titration but since she is already on pulsed flow oxygen we may be able to order this for her at her current flow rate since she is already been qualified.  Please check to see if we can order a POC for her through [employer].  Thank you.

## 2023-05-19 NOTE — Progress Notes (Unsigned)
[facility] [address] [place]. [place] [address] [phone]       HOSPITAL FOLLOW UP NOTE  Ms. CAMRIN ALAMIN Date of Birth:  1946-02-07 Medical Record Number:  [id number]   Reason for Referral:  hospital stroke follow up    SUBJECTIVE:   CHIEF COMPLAINT:  No chief complaint on file.   HPI:   BREON CAPOTE is a 77 y.o. who  has a past medical history of Anemia, Aortic arch anomaly, Breast cancer (HCC) (09/22/2015), Breast cancer of upper-outer quadrant of left female breast (HCC) (09/08/2015), Cataract, immature, CHF (congestive heart failure) (HCC), COPD (chronic obstructive pulmonary disease) (HCC), Encephalopathy acute (11/12/2021), Femur fracture, left (HCC) (04/11/2020), Heart murmur, History of cervical spine x-ray (11/13/2021), History of hyperthyroidism, History of radiation therapy, History of radiation therapy (11/28/2015), Hyperlipidemia, Hypertension, Hypokalemia, Hypothyroidism, Nonfunctioning kidney, Personal history of radiation therapy, Pulmonary nodules, Radiation (10/30/15-11/28/15), Renal artery stenosis (HCC), Tobacco abuse, and Wears partial dentures.  Patient presented on 11/12/2021 with witnessed fall and left sided shaking. Intubated by EMS. While in route to CT, she started posturing and biting ET tube. CT showed acute hemorrhage in left occipital lobe. She was started on Keppra. EEG negative. She was made DNR. She was extubated 11/16/2021. She was discharged to [facility] 11/25/2021. Personally reviewed hospitalization pertinent progress notes, lab work and imaging.  Evaluated by Dr Pearlean Brownie and Dr Roda Shutters.   She spent about a month in SNF. She underwent inpatient PT/OT. She was sent home with outpatient PT/OT services for about 3 weeks. She denies any lingering physical deficits from stroke. She does feel it is harder to get her words out at times. She did not work with ST in rehab or outpatient. 30 day monitor attempted but would not stay connected  to skin. Patient requested discontinuation and has not returned to repeat. She denies chest pain, palpitations, lightheadedness or dizziness. She is on continuous O2, 4L. She reports levetiractam 500mg  twice daily. She has never had a previous seizure. She has not driven in a couple of years. She continues to smoke about a pack per day. She drinks "a couple of beers" every night. She is not interested in stopping at this time. She had two falls about 5-6 months ago. She tripped over her oxygen tubing.   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 She presents with her cousin, Zella Ball, who aids in history. She continues levetiracetam 500mg  BID and seems to be tolerating it fairly well. No obvious seizure activity. She tells me of an event about 3 months ago where she was more confused than normal for a few days. EMS was called but she refused eval. Family feels she was dehydrated. She continues to drink "a few beers" every night. Usually 3-4. She is smoking 1/2-1ppd. She does have days where she seems sharper than others. She continues to live alone. She does not drive. She denies any recent falls. She has a Museum/gallery exhibitions officer but doesn't use around the home. She remains on 3L O2. She is followed closely by PCP and pulmonology. She was seen by Dr Pamelia Hoit 09/26/2022 for new 9mm left lung nodule. She was advised to repeat CT scan and follow up with Dr Delton Coombes in 3 months. No appts scheduled. Gunnar Fusi is her best friend and who she wishes to help her with appt scheduling [phone].   Update 05/20/2023 ALL: Latora returns for follow up for seizures post CVA in 10/2021. She was last seen 10/2022 and doing  well on lev 500mg  BID. Since,   She continues rosuvastatin 10mg  daily.    PERTINENT IMAGING/LABS  MRI: Left occipital hematoma stable.  Some diffusion changes around it.  No metastasis or other lesions noted. CTA Head and Neck: 50% stenosis of the mid right common carotid, 50% stenosis of the  distal L common carotid artery 2D Echo: EF 55 to 60%   A1C Lab Results  Component Value Date   HGBA1C 5.6 11/12/2021    Lipid Panel     Component Value Date/Time   CHOL 152 11/12/2021 1825   CHOL 148 04/23/2017 0931   TRIG 74 11/12/2021 1825   HDL 79 11/12/2021 1825   HDL 92 04/23/2017 0931   CHOLHDL 1.9 11/12/2021 1825   VLDL 15 11/12/2021 1825   LDLCALC 58 11/12/2021 1825   LDLCALC 45 04/23/2017 0931   LABVLDL 11 04/23/2017 0931    ROS:   14 system review of systems performed and negative with exception of those listed in HPI  PMH:  Past Medical History:  Diagnosis Date   Anemia    Aortic arch anomaly    arteria lusoria    Breast cancer (HCC) 09/22/2015   Malignant   Breast cancer of upper-outer quadrant of left female breast (HCC) 09/08/2015   Cataract, immature    CHF (congestive heart failure) (HCC)    Acute CHF-06/2018   COPD (chronic obstructive pulmonary disease) (HCC)    Encephalopathy acute 11/12/2021   Femur fracture, left (HCC) 04/11/2020   left femur fracture( greater trochanter   Heart murmur    states no known problems   History of cervical spine x-ray 11/13/2021   History of hyperthyroidism    History of radiation therapy    bilateral lungs - 10/27/18-11/02/18, Dr. Antony Blackbird   History of radiation therapy 11/28/2015   left breast 10/30/2015-11/28/2015   Dr Antony Blackbird   Hyperlipidemia    Hypertension    states under control with meds., has been on med. x "long time"   Hypokalemia    from last physical.    Hypothyroidism    Nonfunctioning kidney    left   Personal history of radiation therapy    2017   Pulmonary nodules    Bilateral   Radiation 10/30/15-11/28/15   left breast 42.72 Gy, boosted to 10 Gy   Renal artery stenosis (HCC)    Tobacco abuse    Wears partial dentures    upper and lower    PSH:  Past Surgical History:  Procedure Laterality Date   ABDOMINAL ANGIOGRAM  02/18/2012   Procedure: ABDOMINAL ANGIOGRAM;  Surgeon:  Runell Gess, MD;  Location: [facility] CATH LAB;  Service: Cardiovascular;;   ABDOMINAL AORTAGRAM  07/04/2014   ABDOMINAL HYSTERECTOMY  ~ 1977   partial   APPENDECTOMY     ARCH AORTOGRAM     BREAST LUMPECTOMY Left 09/22/2015   Malignant   CAROTID ANGIOGRAM N/A 02/18/2012   Procedure: CAROTID ANGIOGRAM;  Surgeon: Runell Gess, MD;  Location: [facility] CATH LAB;  Service: Cardiovascular;  Laterality: N/A;   ENDARTERECTOMY  04/02/2012   Procedure: ENDARTERECTOMY CAROTID;  Surgeon: Nada Libman, MD;  Location: [facility] OR;  Service: Vascular;  Laterality: Right;   FUDUCIAL PLACEMENT Bilateral 09/02/2018   Procedure: Placement Of Fiducial to right upper lobe & left upper lobe lung;  Surgeon: Leslye Peer, MD;  Location: [facility] OR;  Service: Thoracic;  Laterality: Bilateral;   IR THORACENTESIS ASP PLEURAL SPACE W/IMG GUIDE  07/08/2018   RADIOACTIVE  SEED GUIDED PARTIAL MASTECTOMY WITH AXILLARY SENTINEL LYMPH NODE BIOPSY Left 09/22/2015   Procedure: INJECT BLUE DYE LEFT BREAST,RADIOACTIVE SEED GUIDED PARTIAL MASTECTOMY WITH AXILLARY SENTINEL LYMPH NODE BIOPSY;  Surgeon: Claud Kelp, MD;  Location: [facility];  Service: General;  Laterality: Left;   RENAL ANGIOGRAM Left 06/08/2010   renal artery stent -  5x12 Genesis on Aviator balloon stent (Dr. Erlene Quan)   RENAL ANGIOGRAM Right 07/04/2014   Procedure: RENAL ANGIOGRAM;  Surgeon: Runell Gess, MD;  Location: [facility] CATH LAB;  Service: Cardiovascular;  Laterality: Right;   RENAL ANGIOGRAM Right 08/22/2014   Procedure: RENAL ANGIOGRAM;  Surgeon: Runell Gess, MD;  Location: [facility] CATH LAB;  Service: Cardiovascular;  Laterality: Right;   TONSILLECTOMY     as a child   VIDEO BRONCHOSCOPY WITH ENDOBRONCHIAL NAVIGATION N/A 09/02/2018   Procedure: VIDEO BRONCHOSCOPY WITH ENDOBRONCHIAL NAVIGATION;  Surgeon: Leslye Peer, MD;  Location: [facility] OR;  Service: Thoracic;  Laterality: N/A;    Social History:  Social History   Socioeconomic History    Marital status: Divorced    Spouse name: Not on file   Number of children: 0   Years of education: College   Highest education level: Bachelor's degree (e.g., BA, AB, BS)  Occupational History   Occupation: HR Sales promotion account executive: [employer]  Tobacco Use   Smoking status: Every Day    Current packs/day: 0.50    Average packs/day: 0.5 packs/day for 40.0 years (20.0 ttl pk-yrs)    Types: Cigarettes   Smokeless tobacco: Never   Tobacco comments:    15 cigarettes smoked daily [facility]J 04/19/21  Vaping Use   Vaping status: Never Used  Substance and Sexual Activity   Alcohol use: Yes    Comment: occasionally   Drug use: No   Sexual activity: Never  Other Topics Concern   Not on file  Social History Narrative   Left Handed    1 Cup of Coffee per Day   Social Determinants of Health   Financial Resource Strain: Low Risk  (06/20/2020)   Overall Financial Resource Strain (CARDIA)    Difficulty of Paying Living Expenses: Not hard at all  Food Insecurity: No Food Insecurity (06/20/2020)   Hunger Vital Sign    Worried About Running Out of Food in the Last Year: Never true    Ran Out of Food in the Last Year: Never true  Transportation Needs: No Transportation Needs (06/20/2020)   PRAPARE - Administrator, Civil Service (Medical): No    Lack of Transportation (Non-Medical): No  Physical Activity: Insufficiently Active (06/20/2020)   Exercise Vital Sign    Days of Exercise per Week: 2 days    Minutes of Exercise per Session: 50 min  Stress: No Stress Concern Present (06/20/2020)   [employer] of Occupational Health - Occupational Stress Questionnaire    Feeling of Stress : Only a little  Social Connections: Moderately Isolated (06/20/2020)   Social Connection and Isolation Panel [NHANES]    Frequency of Communication with Friends and Family: More than three times a week    Frequency of Social Gatherings with Friends and Family: More than three times a week     Attends Religious Services: 1 to 4 times per year    Active Member of [employer] or Organizations: No    Attends Banker Meetings: Never    Marital Status: Divorced  Catering manager Violence: Not At Risk (06/20/2020)   Humiliation, Afraid,  Rape, and Kick questionnaire    Fear of Current or Ex-Partner: No    Emotionally Abused: No    Physically Abused: No    Sexually Abused: No    Family History:  Family History  Problem Relation Age of Onset   Heart disease Mother        MI @ 25, died at 37   Cancer Father    Lung cancer Father    Heart disease Maternal Grandmother    Lung cancer Sister    Breast cancer Paternal Aunt     Medications:   Current Outpatient Medications on File Prior to Visit  Medication Sig Dispense Refill   albuterol (VENTOLIN HFA) 108 (90 Base) MCG/ACT inhaler Inhale 2 puffs into the lungs every 6 (six) hours as needed for wheezing or shortness of breath. 8 g 3   amLODipine (NORVASC) 10 MG tablet Take 1 tablet (10 mg total) by mouth daily. (Patient not taking: Reported on 05/14/2022)     aspirin 81 MG tablet Take 1 tablet (81 mg total) by mouth daily.     Budeson-Glycopyrrol-Formoterol (BREZTRI AEROSPHERE) 160-9-4.8 MCG/ACT AERO Inhale 2 puffs into the lungs in the morning and at bedtime. 10.7 g 3   doxazosin (CARDURA) 8 MG tablet Take 1 tablet by mouth daily.     levETIRAcetam (KEPPRA) 500 MG tablet Take 1 tablet (500 mg total) by mouth 2 (two) times daily. 180 tablet 3   levothyroxine (SYNTHROID) 88 MCG tablet Take 88 mcg by mouth daily before breakfast.     metoprolol tartrate (LOPRESSOR) 25 MG tablet Take 25 mg by mouth daily.     OXYGEN Inhale 2-4 L into the lungs as needed. As needed to maintain SATS > 90%     pantoprazole (PROTONIX) 40 MG tablet Take 1 tablet (40 mg total) by mouth daily. 30 tablet 0   rosuvastatin (CRESTOR) 10 MG tablet Take 10 mg by mouth daily.     No current facility-administered medications on file prior to visit.     Allergies:  No Known Allergies   OBJECTIVE:  Physical Exam  There were no vitals filed for this visit.   There is no height or weight on file to calculate BMI. No results found.     06/20/2020    4:18 PM  Depression screen PHQ 2/9  Decreased Interest 0  Down, Depressed, Hopeless 0  PHQ - 2 Score 0     General: well developed, thin, generalized deconditioning, in no evident distress Head: head normocephalic and atraumatic.   Neck: supple with no carotid or supraclavicular bruits Cardiovascular: regular rate and rhythm, no murmurs Musculoskeletal: no deformity Skin:  no rash/petichiae Vascular:  Normal pulses all extremities   Neurologic Exam Mental Status: Awake and fully alert.  Fluent speech and language.  Oriented to place and time. Recent and remote memory intact. Attention span, concentration and fund of knowledge appropriate. Mood and affect appropriate.  Cranial Nerves: Fundoscopic exam reveals sharp disc margins. Pupils equal, briskly reactive to light. Extraocular movements full without nystagmus. Visual fields full to confrontation. Hearing intact. Facial sensation intact. Face, tongue, palate moves normally and symmetrically.  Motor: Normal bulk and tone. Normal strength in all tested extremity muscles Sensory.: intact to touch , pinprick , position and vibratory sensation.  Coordination: Rapid alternating movements normal in all extremities. Finger-to-nose and heel-to-shin performed accurately bilaterally. Gait and Station: Arises from chair without difficulty. Stance is normal. Gait demonstrates normal stride length and slightly impaired balance with no assistive device.  Reflexes: 1+ and symmetric.    NIHSS  0 Modified Rankin  1   ASSESSMENT: LAIKYN MCPHAUL is a 77 y.o. year old female presented on 11/12/2021 with witnessed fall and left sided shaking. Vascular risk factors include HTN, HLD, tobacco and alcohol use, advanced age.    PLAN:  ICH -  left occipital ICH, unusual location, concerning for possible hemorrhagic infarct  : Residual deficit: word finding difficulty. Continue No antithrombotic and rosuvastatin 10mg  daily for secondary stroke prevention.  Discussed secondary stroke prevention measures and importance of close PCP follow up for aggressive stroke risk factor management. I have gone over the pathophysiology of stroke, warning signs and symptoms, risk factors and their management in some detail with instructions to go to the closest emergency room for symptoms of concern. HTN: BP goal <130/90.  Stable on metoprolol per PCP HLD: LDL goal <70. Recent LDL 58. Continue rosuvastatin per PCP.  DMII: A1c goal<7.0. Recent A1c 5.6. Continue to monitor with PCP.  Seizure: continue levetiracetam 500mg  twice daily.   Tobacco use: Consider cessation. Alcohol abuse: limit alcohol intake to 1-2 drinks per week. Pulmonary nodule: please follow up closely with pulmonology and oncology. Repeat CT scan in 3 months.    Follow up in 6 months  or call earlier if needed   CC:  [facility] provider: Dr. Pearlean Brownie PCP: Georgianne Fick, MD    I spent 30 minutes of face-to-face and non-face-to-face time with patient.  This included previsit chart review including review of recent hospitalization, lab review, study review, order entry, electronic health record documentation, patient education regarding recent stroke including etiology, secondary stroke prevention measures and importance of managing stroke risk factors, residual deficits and typical recovery time and answered all other questions to patient satisfaction   Shawnie Dapper, [facility]  [facility] [address] [place], [place] [address]  Phone [phone] Fax [phone] Note: This document was prepared with digital dictation and possible smart phrase technology. Any transcriptional errors that result from this process are unintentional.

## 2023-05-19 NOTE — Patient Instructions (Signed)
Below is our plan:  We will continue levetiracetam 500mg  twice daily. Continue rosuvastatin and aspirin per your PCP. Please call Dr Allyson Sabal to ask about scheduling follow up.   Please make sure you are consistent with timing of seizure medication. I recommend annual visit with primary care provider (PCP) for complete physical and routine blood work. I recommend daily intake of vitamin D (400-800iu) and calcium (800-1000mg ) for bone health. Discuss Dexa screening with PCP.   According to [place] law, you can not drive unless you are seizure / syncope free for at least 6 months and under physician's care.  Please maintain precautions. Do not participate in activities where a loss of awareness could harm you or someone else. No swimming alone, no tub bathing, no hot tubs, no driving, no operating motorized vehicles (cars, ATVs, motocycles, etc), lawnmowers, power tools or firearms. No standing at heights, such as rooftops, ladders or stairs. Avoid hot objects such as stoves, heaters, open fires. Wear a helmet when riding a bicycle, scooter, skateboard, etc. and avoid areas of traffic. Set your water heater to 120 degrees or less.  SUDEP is the sudden, unexpected death of someone with epilepsy, who was otherwise healthy. In SUDEP cases, no other cause of death is found when an autopsy is done. Each year, more than 1 in 1,000 people with epilepsy die from SUDEP. This is the leading cause of death in people with uncontrolled seizures. Until further answers are available, the best way to prevent SUDEP is to lower your risk by controlling seizures. Research has found that people with all types of epilepsy that experience convulsive seizures can be at risk.  Goals:  1) Maintain strict control of hypertension with blood pressure goal below 130/90 2) Maintain good control of diabetes with hemoglobin A1c goal below 7%  3) Maintain good control of lipids with LDL cholesterol goal below 70 mg/dL.  4) Eat a healthy diet  with plenty of whole grains, cereals, fruits and vegetables, exercise regularly and maintain ideal body weight   Resources: https://www.williams.biz/  Please make sure you are staying well hydrated. I recommend 50-60 ounces daily. Well balanced diet and regular exercise encouraged. Consistent sleep schedule with 6-8 hours recommended.   Please continue follow up with care team as directed.   Follow up with me in 1 year   You may receive a survey regarding today's visit. I encourage you to leave honest feed back as I do use this information to improve patient care. Thank you for seeing me today!

## 2023-05-20 ENCOUNTER — Ambulatory Visit (INDEPENDENT_AMBULATORY_CARE_PROVIDER_SITE_OTHER): Payer: Medicare Other | Admitting: Family Medicine

## 2023-05-20 ENCOUNTER — Encounter: Payer: Self-pay | Admitting: Family Medicine

## 2023-05-20 VITALS — BP 141/51 | HR 72 | Ht 60.0 in | Wt 81.0 lb

## 2023-05-20 DIAGNOSIS — R569 Unspecified convulsions: Secondary | ICD-10-CM | POA: Diagnosis not present

## 2023-05-20 DIAGNOSIS — I611 Nontraumatic intracerebral hemorrhage in hemisphere, cortical: Secondary | ICD-10-CM | POA: Diagnosis not present

## 2023-05-20 MED ORDER — LEVETIRACETAM 500 MG PO TABS: 500.0000 mg | ORAL_TABLET | Freq: Two times a day (BID) | ORAL | 3 refills | Status: AC

## 2023-06-16 DIAGNOSIS — R000 Tachycardia, unspecified: Secondary | ICD-10-CM | POA: Diagnosis not present

## 2023-06-16 DIAGNOSIS — R0689 Other abnormalities of breathing: Secondary | ICD-10-CM | POA: Diagnosis not present

## 2023-06-18 ENCOUNTER — Telehealth: Payer: Self-pay | Admitting: Emergency Medicine

## 2023-06-30 NOTE — Telephone Encounter (Signed)
Called patient to confirm CT appt 07/30/23 and f/u with RB 08/07/23.NFN

## 2023-07-11 ENCOUNTER — Other Ambulatory Visit: Payer: Self-pay

## 2023-07-11 ENCOUNTER — Encounter (HOSPITAL_COMMUNITY): Payer: Self-pay

## 2023-07-11 ENCOUNTER — Emergency Department (HOSPITAL_COMMUNITY): Payer: Medicare Other

## 2023-07-11 ENCOUNTER — Inpatient Hospital Stay (HOSPITAL_COMMUNITY)
Admission: EM | Admit: 2023-07-11 | Discharge: 2023-07-18 | DRG: 280 | Disposition: A | Discharge status: Skilled Nursing Facility | Payer: Medicare Other | Attending: Internal Medicine | Admitting: Internal Medicine

## 2023-07-11 ENCOUNTER — Encounter (HOSPITAL_COMMUNITY)
Admission: EM | Disposition: A | Discharge status: Skilled Nursing Facility | Payer: Self-pay | Source: Home / Self Care | Attending: Internal Medicine

## 2023-07-11 DIAGNOSIS — E785 Hyperlipidemia, unspecified: Secondary | ICD-10-CM | POA: Diagnosis present

## 2023-07-11 DIAGNOSIS — J441 Chronic obstructive pulmonary disease with (acute) exacerbation: Secondary | ICD-10-CM | POA: Diagnosis not present

## 2023-07-11 DIAGNOSIS — F17210 Nicotine dependence, cigarettes, uncomplicated: Secondary | ICD-10-CM | POA: Diagnosis present

## 2023-07-11 DIAGNOSIS — Z803 Family history of malignant neoplasm of breast: Secondary | ICD-10-CM

## 2023-07-11 DIAGNOSIS — E1151 Type 2 diabetes mellitus with diabetic peripheral angiopathy without gangrene: Secondary | ICD-10-CM | POA: Diagnosis present

## 2023-07-11 DIAGNOSIS — E876 Hypokalemia: Secondary | ICD-10-CM | POA: Diagnosis present

## 2023-07-11 DIAGNOSIS — Z7401 Bed confinement status: Secondary | ICD-10-CM | POA: Diagnosis not present

## 2023-07-11 DIAGNOSIS — F03A0 Unspecified dementia, mild, without behavioral disturbance, psychotic disturbance, mood disturbance, and anxiety: Secondary | ICD-10-CM | POA: Diagnosis present

## 2023-07-11 DIAGNOSIS — S2242XD Multiple fractures of ribs, left side, subsequent encounter for fracture with routine healing: Secondary | ICD-10-CM | POA: Diagnosis not present

## 2023-07-11 DIAGNOSIS — R64 Cachexia: Secondary | ICD-10-CM | POA: Diagnosis not present

## 2023-07-11 DIAGNOSIS — C3492 Malignant neoplasm of unspecified part of left bronchus or lung: Secondary | ICD-10-CM | POA: Diagnosis not present

## 2023-07-11 DIAGNOSIS — Z79899 Other long term (current) drug therapy: Secondary | ICD-10-CM

## 2023-07-11 DIAGNOSIS — G9341 Metabolic encephalopathy: Secondary | ICD-10-CM | POA: Diagnosis present

## 2023-07-11 DIAGNOSIS — Z7982 Long term (current) use of aspirin: Secondary | ICD-10-CM

## 2023-07-11 DIAGNOSIS — I5033 Acute on chronic diastolic (congestive) heart failure: Secondary | ICD-10-CM | POA: Diagnosis present

## 2023-07-11 DIAGNOSIS — I6523 Occlusion and stenosis of bilateral carotid arteries: Secondary | ICD-10-CM | POA: Diagnosis not present

## 2023-07-11 DIAGNOSIS — R0789 Other chest pain: Secondary | ICD-10-CM | POA: Diagnosis not present

## 2023-07-11 DIAGNOSIS — R0602 Shortness of breath: Secondary | ICD-10-CM | POA: Diagnosis not present

## 2023-07-11 DIAGNOSIS — R41841 Cognitive communication deficit: Secondary | ICD-10-CM | POA: Diagnosis not present

## 2023-07-11 DIAGNOSIS — Z7951 Long term (current) use of inhaled steroids: Secondary | ICD-10-CM

## 2023-07-11 DIAGNOSIS — I739 Peripheral vascular disease, unspecified: Secondary | ICD-10-CM | POA: Diagnosis not present

## 2023-07-11 DIAGNOSIS — Z681 Body mass index (BMI) 19 or less, adult: Secondary | ICD-10-CM

## 2023-07-11 DIAGNOSIS — Z1152 Encounter for screening for COVID-19: Secondary | ICD-10-CM | POA: Diagnosis not present

## 2023-07-11 DIAGNOSIS — Z79890 Hormone replacement therapy (postmenopausal): Secondary | ICD-10-CM

## 2023-07-11 DIAGNOSIS — I252 Old myocardial infarction: Secondary | ICD-10-CM | POA: Diagnosis not present

## 2023-07-11 DIAGNOSIS — J432 Centrilobular emphysema: Secondary | ICD-10-CM | POA: Diagnosis not present

## 2023-07-11 DIAGNOSIS — Z8673 Personal history of transient ischemic attack (TIA), and cerebral infarction without residual deficits: Secondary | ICD-10-CM

## 2023-07-11 DIAGNOSIS — Z923 Personal history of irradiation: Secondary | ICD-10-CM | POA: Diagnosis not present

## 2023-07-11 DIAGNOSIS — I6529 Occlusion and stenosis of unspecified carotid artery: Secondary | ICD-10-CM | POA: Diagnosis not present

## 2023-07-11 DIAGNOSIS — I214 Non-ST elevation (NSTEMI) myocardial infarction: Principal | ICD-10-CM | POA: Diagnosis present

## 2023-07-11 DIAGNOSIS — R918 Other nonspecific abnormal finding of lung field: Secondary | ICD-10-CM | POA: Diagnosis not present

## 2023-07-11 DIAGNOSIS — J9611 Chronic respiratory failure with hypoxia: Secondary | ICD-10-CM | POA: Diagnosis not present

## 2023-07-11 DIAGNOSIS — R071 Chest pain on breathing: Secondary | ICD-10-CM | POA: Diagnosis not present

## 2023-07-11 DIAGNOSIS — Z8249 Family history of ischemic heart disease and other diseases of the circulatory system: Secondary | ICD-10-CM | POA: Diagnosis not present

## 2023-07-11 DIAGNOSIS — M6281 Muscle weakness (generalized): Secondary | ICD-10-CM | POA: Diagnosis not present

## 2023-07-11 DIAGNOSIS — G40909 Epilepsy, unspecified, not intractable, without status epilepticus: Secondary | ICD-10-CM | POA: Diagnosis present

## 2023-07-11 DIAGNOSIS — E039 Hypothyroidism, unspecified: Secondary | ICD-10-CM | POA: Diagnosis not present

## 2023-07-11 DIAGNOSIS — C3491 Malignant neoplasm of unspecified part of right bronchus or lung: Secondary | ICD-10-CM | POA: Diagnosis not present

## 2023-07-11 DIAGNOSIS — Z801 Family history of malignant neoplasm of trachea, bronchus and lung: Secondary | ICD-10-CM

## 2023-07-11 DIAGNOSIS — Z9981 Dependence on supplemental oxygen: Secondary | ICD-10-CM

## 2023-07-11 DIAGNOSIS — I110 Hypertensive heart disease with heart failure: Secondary | ICD-10-CM | POA: Diagnosis not present

## 2023-07-11 DIAGNOSIS — E43 Unspecified severe protein-calorie malnutrition: Secondary | ICD-10-CM | POA: Diagnosis not present

## 2023-07-11 DIAGNOSIS — I10 Essential (primary) hypertension: Secondary | ICD-10-CM | POA: Diagnosis not present

## 2023-07-11 DIAGNOSIS — I69351 Hemiplegia and hemiparesis following cerebral infarction affecting right dominant side: Secondary | ICD-10-CM | POA: Diagnosis not present

## 2023-07-11 DIAGNOSIS — K219 Gastro-esophageal reflux disease without esophagitis: Secondary | ICD-10-CM | POA: Diagnosis not present

## 2023-07-11 DIAGNOSIS — R4182 Altered mental status, unspecified: Secondary | ICD-10-CM | POA: Diagnosis not present

## 2023-07-11 DIAGNOSIS — C3490 Malignant neoplasm of unspecified part of unspecified bronchus or lung: Secondary | ICD-10-CM | POA: Diagnosis not present

## 2023-07-11 DIAGNOSIS — I700 Atherosclerosis of aorta: Secondary | ICD-10-CM | POA: Diagnosis not present

## 2023-07-11 DIAGNOSIS — Z85118 Personal history of other malignant neoplasm of bronchus and lung: Secondary | ICD-10-CM

## 2023-07-11 DIAGNOSIS — I619 Nontraumatic intracerebral hemorrhage, unspecified: Secondary | ICD-10-CM | POA: Diagnosis not present

## 2023-07-11 DIAGNOSIS — R069 Unspecified abnormalities of breathing: Secondary | ICD-10-CM | POA: Diagnosis not present

## 2023-07-11 DIAGNOSIS — I69319 Unspecified symptoms and signs involving cognitive functions following cerebral infarction: Secondary | ICD-10-CM | POA: Diagnosis not present

## 2023-07-11 DIAGNOSIS — Z90710 Acquired absence of both cervix and uterus: Secondary | ICD-10-CM

## 2023-07-11 DIAGNOSIS — R262 Difficulty in walking, not elsewhere classified: Secondary | ICD-10-CM | POA: Diagnosis not present

## 2023-07-11 DIAGNOSIS — H9190 Unspecified hearing loss, unspecified ear: Secondary | ICD-10-CM | POA: Diagnosis present

## 2023-07-11 DIAGNOSIS — C50412 Malignant neoplasm of upper-outer quadrant of left female breast: Secondary | ICD-10-CM | POA: Diagnosis not present

## 2023-07-11 DIAGNOSIS — R911 Solitary pulmonary nodule: Secondary | ICD-10-CM | POA: Diagnosis present

## 2023-07-11 DIAGNOSIS — G40901 Epilepsy, unspecified, not intractable, with status epilepticus: Secondary | ICD-10-CM | POA: Diagnosis not present

## 2023-07-11 DIAGNOSIS — I701 Atherosclerosis of renal artery: Secondary | ICD-10-CM | POA: Diagnosis not present

## 2023-07-11 DIAGNOSIS — J449 Chronic obstructive pulmonary disease, unspecified: Secondary | ICD-10-CM | POA: Diagnosis not present

## 2023-07-11 DIAGNOSIS — Z853 Personal history of malignant neoplasm of breast: Secondary | ICD-10-CM

## 2023-07-11 LAB — CBC WITH DIFFERENTIAL/PLATELET
Abs Immature Granulocytes: 0.08 10*3/uL — ABNORMAL HIGH (ref 0.00–0.07)
Basophils Absolute: 0.1 10*3/uL (ref 0.0–0.1)
Basophils Relative: 1 %
Eosinophils Absolute: 0.2 10*3/uL (ref 0.0–0.5)
Eosinophils Relative: 2 %
HCT: 33.7 % — ABNORMAL LOW (ref 36.0–46.0)
Hemoglobin: 11 g/dL — ABNORMAL LOW (ref 12.0–15.0)
Immature Granulocytes: 1 %
Lymphocytes Relative: 9 %
Lymphs Abs: 0.9 10*3/uL (ref 0.7–4.0)
MCH: 28.4 pg (ref 26.0–34.0)
MCHC: 32.6 g/dL (ref 30.0–36.0)
MCV: 87.1 fL (ref 80.0–100.0)
Monocytes Absolute: 1 10*3/uL (ref 0.1–1.0)
Monocytes Relative: 10 %
Neutro Abs: 7.9 10*3/uL — ABNORMAL HIGH (ref 1.7–7.7)
Neutrophils Relative %: 77 %
Platelets: 262 10*3/uL (ref 150–400)
RBC: 3.87 MIL/uL (ref 3.87–5.11)
RDW: 16.4 % — ABNORMAL HIGH (ref 11.5–15.5)
WBC: 10 10*3/uL (ref 4.0–10.5)
nRBC: 0 % (ref 0.0–0.2)

## 2023-07-11 LAB — COMPREHENSIVE METABOLIC PANEL
ALT: 78 U/L — ABNORMAL HIGH (ref 0–44)
AST: 89 U/L — ABNORMAL HIGH (ref 15–41)
Albumin: 4.4 g/dL (ref 3.5–5.0)
Alkaline Phosphatase: 84 U/L (ref 38–126)
Anion gap: 13 (ref 5–15)
BUN: 24 mg/dL — ABNORMAL HIGH (ref 8–23)
CO2: 32 mmol/L (ref 22–32)
Calcium: 9 mg/dL (ref 8.9–10.3)
Chloride: 90 mmol/L — ABNORMAL LOW (ref 98–111)
Creatinine, Ser: 0.83 mg/dL (ref 0.44–1.00)
GFR, Estimated: >60 mL/min (ref >60)
Glucose, Bld: 102 mg/dL — ABNORMAL HIGH (ref 70–99)
Potassium: 3.1 mmol/L — ABNORMAL LOW (ref 3.5–5.1)
Sodium: 135 mmol/L (ref 135–145)
Total Bilirubin: 0.7 mg/dL (ref <1.2)
Total Protein: 7.7 g/dL (ref 6.5–8.1)

## 2023-07-11 LAB — BRAIN NATRIURETIC PEPTIDE: B Natriuretic Peptide: 1494.1 pg/mL — ABNORMAL HIGH (ref 0.0–100.0)

## 2023-07-11 LAB — HEPARIN LEVEL (UNFRACTIONATED): Heparin Unfractionated: <0.1 [IU]/mL — ABNORMAL LOW (ref 0.30–0.70)

## 2023-07-11 LAB — PROTIME-INR
INR: 1.1 (ref 0.8–1.2)
Prothrombin Time: 13.9 s (ref 11.4–15.2)

## 2023-07-11 LAB — RESP PANEL BY RT-PCR (RSV, FLU A&B, COVID)  RVPGX2
Influenza A by PCR: NEGATIVE
Influenza B by PCR: NEGATIVE
Resp Syncytial Virus by PCR: NEGATIVE
SARS Coronavirus 2 by RT PCR: NEGATIVE

## 2023-07-11 LAB — APTT: aPTT: 28 s (ref 24–36)

## 2023-07-11 LAB — TROPONIN I (HIGH SENSITIVITY)
Troponin I (High Sensitivity): 2449 ng/L (ref <18)
Troponin I (High Sensitivity): 2615 ng/L (ref <18)

## 2023-07-11 SURGERY — LEFT HEART CATH AND CORONARY ANGIOGRAPHY
Anesthesia: LOCAL

## 2023-07-11 MED ORDER — SODIUM CHLORIDE 0.9 % WEIGHT BASED INFUSION
1.0000 mL/kg/h | INTRAVENOUS | Status: AC
  Administered 2023-07-11: 1 mL/kg/h via INTRAVENOUS

## 2023-07-11 MED ORDER — ASPIRIN 81 MG PO TBEC
81.0000 mg | DELAYED_RELEASE_TABLET | Freq: Every day | ORAL | Status: DC
  Administered 2023-07-12 – 2023-07-18 (×7): 81 mg via ORAL
  Filled 2023-07-11 (×7): qty 1

## 2023-07-11 MED ORDER — ASPIRIN 81 MG PO CHEW
324.0000 mg | CHEWABLE_TABLET | Freq: Once | ORAL | Status: AC
  Administered 2023-07-11: 324 mg via ORAL
  Filled 2023-07-11: qty 4

## 2023-07-11 MED ORDER — METOPROLOL TARTRATE 25 MG PO TABS
25.0000 mg | ORAL_TABLET | Freq: Once | ORAL | Status: AC
  Administered 2023-07-11: 25 mg via ORAL
  Filled 2023-07-11: qty 1

## 2023-07-11 MED ORDER — ROSUVASTATIN CALCIUM 20 MG PO TABS
40.0000 mg | ORAL_TABLET | Freq: Every day | ORAL | Status: DC
Start: 2023-07-11 — End: 2023-07-13
  Administered 2023-07-11 – 2023-07-13 (×3): 40 mg via ORAL
  Filled 2023-07-11 (×3): qty 2

## 2023-07-11 MED ORDER — METOPROLOL TARTRATE 25 MG PO TABS
25.0000 mg | ORAL_TABLET | Freq: Two times a day (BID) | ORAL | Status: DC
  Administered 2023-07-11 – 2023-07-14 (×7): 25 mg via ORAL
  Filled 2023-07-11 (×7): qty 1

## 2023-07-11 MED ORDER — IOHEXOL 350 MG/ML SOLN
60.0000 mL | Freq: Once | INTRAVENOUS | Status: AC | PRN
  Administered 2023-07-11: 60 mL via INTRAVENOUS

## 2023-07-11 MED ORDER — POTASSIUM CHLORIDE CRYS ER 20 MEQ PO TBCR
40.0000 meq | EXTENDED_RELEASE_TABLET | Freq: Once | ORAL | Status: AC
  Administered 2023-07-11: 40 meq via ORAL
  Filled 2023-07-11: qty 2

## 2023-07-11 MED ORDER — ALBUTEROL SULFATE HFA 108 (90 BASE) MCG/ACT IN AERS: 2.0000 | INHALATION_SPRAY | RESPIRATORY_TRACT | Status: DC | PRN

## 2023-07-11 MED ORDER — HEPARIN (PORCINE) 25000 UT/250ML-% IV SOLN
850.0000 [IU]/h | INTRAVENOUS | Status: DC
  Administered 2023-07-11: 450 [IU]/h via INTRAVENOUS
  Administered 2023-07-12: 850 [IU]/h via INTRAVENOUS
  Administered 2023-07-12: 750 [IU]/h via INTRAVENOUS
  Filled 2023-07-11 (×2): qty 250

## 2023-07-11 MED ORDER — AMLODIPINE BESYLATE 5 MG PO TABS
10.0000 mg | ORAL_TABLET | Freq: Once | ORAL | Status: AC
  Administered 2023-07-11: 10 mg via ORAL
  Filled 2023-07-11: qty 2

## 2023-07-11 MED ORDER — SODIUM CHLORIDE 0.9 % WEIGHT BASED INFUSION
3.0000 mL/kg/h | INTRAVENOUS | Status: AC
  Administered 2023-07-11: 3 mL/kg/h via INTRAVENOUS

## 2023-07-11 NOTE — Consult Note (Addendum)
Cardiology Consultation   Patient ID: Kendra Watts MRN: [id number]; DOB: [birth year]  Admit date: 07/11/2023 Date of Consult: 07/11/2023  PCP:  Georgianne Fick, MD   [facility] Providers Cardiologist:  Nanetta Batty, MD        Patient Profile:   Kendra Watts is a 77 y.o. 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  History of Present Illness:   Kendra Watts is a 77 year old female with the above history.  She has long history of PAD involving renal arteries, carotid arteries, and lower extremity arteries.  She underwent stenting to the left renal artery in 2011 and then stenting to right renal artery in 2016 as well as right CEA in 2013.  She also has known moderate iliac disease which has been treated medically.  Most recent ABIs in 10/2020 showed mildly reduced ABI on the left (0.90) but normal ABI in the right (0.95).  Carotid dopplers in 10/2020 showed mild stenosis (1-39%) of the left ICA, patent CEA on the right, >50% stenosis of bilateral CCA, and >50% stenosis of left ECA.  Most recent renal ultrasound in 02/2021 showed known atrophic left kidney, 1-59% stenosis of right renal artery, and significant stenosis in the mid abdominal aorta.  She has not been seen by Cardiology since 11/2020.  She was admitted in 10/2021 after presenting with altered mental status and a tonic clonic seizure.  She was found to have an  intraparenchymal hemorrhage in the left occipital lobe.  Neurology was consulted and this was felt to be a hemorrhagic transformation of a stroke.  Echo showed LVEF of 55-60% with severe LVH and grade 1 diastolic dysfunction.  Hospitalization was complicated by COPD exacerbation, acute cystitis, and acute metabolic encephalopathy.  Per chart review, she was noted to have a very brief narrow complex tachycardia on telemetry while in the hospital.  An event monitor was ordered at discharge rule out atrial fibrillation but does not look like this was completed. It looks like she did attempt to wear the monitor but it would not stay connected to her skin.  Cardiology was not consulted during this admission.  Patient presented to the [facility] ED this morning for further evaluation of shortness of breath.  On arrival to the ED, patient mildly tachycardic and hypertensive. EKG showed sinus tachycardia, rate 113 bpm, with slight ST depression in inferolateral leads an non-specific T wave changes.  QTc was prolonged. Initial high-sensitivity troponin was elevated at 2, 449. BNP elevated at 1,494.  Chest x-ray showed no acute findings.  Chest CTA pending. WBC 10, Hgb 11, Plts 262. Na 135, K 3.1, Glucose 102, BUN 24, Cr 0.83. Albumin 4.4, AST 89, ALT 78, Alk Phos 84, Total Bili 0.7.   At the time of this evaluation, patient is resting comfortably on 4 L of O2 which is her baseline.  When asked what brought her into the emergency department, she was initially unable to answer and states she was told to come in.  When I asked who told her to come in, and she was unsure.  Several prompts, she was able to tell me that she has been having some worsening shortness of breath primarily over the last week.  She has chronic shortness of breath due to her COPD but this has been much worse to the point that she has not been able to do much.  She has chronic stable orthopnea but denies any PND or significant edema.  Denies any chest  pain.  She does report some heart racing for the last couple weeks but denies any lightheadedness/dizziness, syncope.  She had some nasal congestion and a productive cough but no recent fevers.  No nausea, vomiting, diarrhea.  No abnormal bleeding including hemoptysis, hematuria, hematochezia, or melena.  She does report she has had some memory issues but cannot tell me how long this has been going on for.  She lives by herself and does all activities of daily living independently.  She she does not have any family but does have a few good friends in the area.  Past Medical History:  Diagnosis Date   Anemia    Aortic arch anomaly    arteria lusoria    Breast cancer (HCC) 09/22/2015   Malignant   Breast cancer of upper-outer quadrant of left female breast (HCC) 09/08/2015   Cataract, immature    CHF (congestive heart failure) (HCC)    Acute CHF-06/2018   COPD (chronic obstructive pulmonary disease) (HCC)    Encephalopathy acute 11/12/2021   Femur fracture, left (HCC) 04/11/2020   left femur fracture( greater trochanter   Heart murmur    states no known problems   History of cervical spine x-ray 11/13/2021   History of hyperthyroidism    History of radiation therapy    bilateral lungs - 10/27/18-11/02/18, Dr. Antony Blackbird   History of radiation therapy 11/28/2015   left breast 10/30/2015-11/28/2015   Dr Antony Blackbird   Hyperlipidemia    Hypertension    states under control with meds., has been on med. x "long time"   Hypokalemia    from last physical.    Hypothyroidism    Nonfunctioning kidney    left   Personal history of radiation therapy    2017   Pulmonary nodules    Bilateral   Radiation 10/30/15-11/28/15   left breast 42.72 Gy, boosted to 10 Gy   Renal artery stenosis (HCC)    Tobacco abuse    Wears partial dentures    upper and lower    Past Surgical History:  Procedure Laterality Date   ABDOMINAL ANGIOGRAM  02/18/2012   Procedure: ABDOMINAL ANGIOGRAM;  Surgeon: Runell Gess, MD;  Location: [facility] C[facility] LAB;  Service: Cardiovascular;;   ABDOMINAL AORTAGRAM  07/04/2014   ABDOMINAL HYSTERECTOMY  ~ 1977   partial   APPENDECTOMY     ARCH AORTOGRAM     BREAST LUMPECTOMY Left 09/22/2015   Malignant   CAROTID ANGIOGRAM N/A 02/18/2012   Procedure: CAROTID ANGIOGRAM;  Surgeon: Runell Gess, MD;  Location: [facility] CATH LAB;  Service: Cardiovascular;  Laterality: N/A;   ENDARTERECTOMY  04/02/2012   Procedure: ENDARTERECTOMY CAROTID;  Surgeon: Nada Libman, MD;  Location: [facility] OR;  Service: Vascular;  Laterality: Right;   FUDUCIAL PLACEMENT Bilateral 09/02/2018   Procedure: Placement Of Fiducial to right upper lobe & left upper lobe lung;  Surgeon: Leslye Peer, MD;  Location: [facility] OR;  Service: Thoracic;  Laterality: Bilateral;   IR THORACENTESIS ASP PLEURAL  SPACE W/IMG GUIDE  07/08/2018   RADIOACTIVE SEED GUIDED PARTIAL MASTECTOMY WITH AXILLARY SENTINEL LYMPH NODE BIOPSY Left 09/22/2015   Procedure: INJECT BLUE DYE LEFT BREAST,RADIOACTIVE SEED GUIDED PARTIAL MASTECTOMY WITH AXILLARY SENTINEL LYMPH NODE BIOPSY;  Surgeon: Claud Kelp, MD;  Location: [facility];  Service: General;  Laterality: Left;   RENAL ANGIOGRAM Left 06/08/2010   renal artery stent -  5x12 Genesis on Aviator balloon stent (Dr. Erlene Quan)   RENAL ANGIOGRAM Right 07/04/2014   Procedure: RENAL ANGIOGRAM;  Surgeon: Runell Gess, MD;  Location: [facility] CATH LAB;  Service: Cardiovascular;  Laterality: Right;   RENAL ANGIOGRAM Right 08/22/2014   Procedure: RENAL ANGIOGRAM;  Surgeon: Runell Gess, MD;  Location: [facility] CATH LAB;  Service: Cardiovascular;  Laterality: Right;   TONSILLECTOMY     as a child   VIDEO BRONCHOSCOPY WITH ENDOBRONCHIAL NAVIGATION N/A 09/02/2018   Procedure: VIDEO BRONCHOSCOPY WITH ENDOBRONCHIAL NAVIGATION;  Surgeon: Leslye Peer, MD;  Location: [facility] OR;  Service: Thoracic;  Laterality: N/A;     Home Medications:  Prior to Admission medications   Medication Sig Start  Date End Date Taking? Authorizing Provider  aspirin 81 MG tablet Take 1 tablet (81 mg total) by mouth daily. 03/21/20  Yes Hongalgi, Maximino Greenland, MD  Budeson-Glycopyrrol-Formoterol (BREZTRI AEROSPHERE) 160-9-4.8 MCG/ACT AERO Inhale 2 puffs into the lungs in the morning and at bedtime. 06/12/21  Yes Leslye Peer, MD  doxazosin (CARDURA) 8 MG tablet Take 8 mg by mouth daily.   Yes [provider]  furosemide (LASIX) 20 MG tablet Take 20 mg by mouth daily.   Yes [provider]  levETIRAcetam (KEPPRA) 500 MG tablet Take 1 tablet (500 mg total) by mouth 2 (two) times daily. 05/20/23  Yes Lomax, Amy, NP  levothyroxine (SYNTHROID) 88 MCG tablet Take 88 mcg by mouth daily before breakfast.   Yes [provider]  metoprolol tartrate (LOPRESSOR) 25 MG tablet Take 25 mg by mouth daily. 09/21/20  Yes [provider]  pantoprazole (PROTONIX) 40 MG tablet Take 1 tablet (40 mg total) by mouth daily. 07/14/18  Yes Dow Adolph N, DO  rosuvastatin (CRESTOR) 10 MG tablet Take 10 mg by mouth daily.   Yes [provider]  albuterol (VENTOLIN HFA) 108 (90 Base) MCG/ACT inhaler Inhale 2 puffs into the lungs every 6 (six) hours as needed for wheezing or shortness of breath. Patient not taking: Reported on 07/11/2023 11/17/20   Leslye Peer, MD  OXYGEN Inhale 2-4 L into the lungs as needed. As needed to maintain SATS > 90%    [provider]    Inpatient Medications: Scheduled Meds:   Continuous Infusions:  sodium chloride     heparin 450 Units/hr (07/11/23 1343)   PRN Meds:   Allergies:   No Known Allergies  Social History:   Social History   Socioeconomic History   Marital status: Divorced    Spouse name: Not on file   Number of children: 0   Years of education: College   Highest education level: Bachelor's degree (e.g., BA, AB, BS)  Occupational History   Occupation: HR Sales promotion account executive: [employer]  Tobacco Use   Smoking status:  Every Day    Current packs/day: 0.50    Average packs/day: 0.5 packs/day for 40.0 years (20.0 ttl pk-yrs)    Types: Cigarettes   Smokeless tobacco: Never   Tobacco comments:    15 cigarettes smoked daily ARJ 04/19/21  Vaping Use  Vaping status: Never Used  Substance and Sexual Activity   Alcohol use: Yes    Alcohol/week: 4.0 standard drinks of alcohol    Types: 4 Cans of beer per week    Comment: occasionally   Drug use: No   Sexual activity: Never  Other Topics Concern   Not on file  Social History Narrative   Left Handed    1 Cup of Coffee per Day   Social Drivers of Health   Financial Resource Strain: Low Risk  (06/20/2020)   Overall Financial Resource Strain (CARDIA)    Difficulty of Paying Living Expenses: Not hard at all  Food Insecurity: No Food Insecurity (06/20/2020)   Hunger Vital Sign    Worried About Running Out of Food in the Last Year: Never true    Ran Out of Food in the Last Year: Never true  Transportation Needs: No Transportation Needs (06/20/2020)   PRAPARE - Administrator, Civil Service (Medical): No    Lack of Transportation (Non-Medical): No  Physical Activity: Insufficiently Active (06/20/2020)   Exercise Vital Sign    Days of Exercise per Week: 2 days    Minutes of Exercise per Session: 50 min  Stress: No Stress Concern Present (06/20/2020)   [employer] of Occupational Health - Occupational Stress Questionnaire    Feeling of Stress : Only a little  Social Connections: Moderately Isolated (06/20/2020)   Social Connection and Isolation Panel [NHANES]    Frequency of Communication with Friends and Family: More than three times a week    Frequency of Social Gatherings with Friends and Family: More than three times a week    Attends Religious Services: 1 to 4 times per year    Active Member of [employer] or Organizations: No    Attends Banker Meetings: Never    Marital Status: Divorced  Catering manager Violence: Not At  Risk (06/20/2020)   Humiliation, Afraid, Rape, and Kick questionnaire    Fear of Current or Ex-Partner: No    Emotionally Abused: No    Physically Abused: No    Sexually Abused: No    Family History:   Family History  Problem Relation Age of Onset   Heart disease Mother        MI @ 57, died at 69   Cancer Father    Lung cancer Father    Lung cancer Sister    Breast cancer Paternal Aunt    Heart disease Maternal Grandmother    Stroke Neg Hx      ROS:  Please see the history of present illness.  Review of Systems  Constitutional:  Negative for fever.  HENT:  Positive for congestion.   Respiratory:  Positive for cough, sputum production and shortness of breath. Negative for hemoptysis.   Cardiovascular:  Positive for palpitations and orthopnea. Negative for chest pain, leg swelling and PND.  Gastrointestinal:  Positive for vomiting. Negative for blood in stool, diarrhea, melena and nausea.  Genitourinary:  Negative for hematuria.  Musculoskeletal:  Negative for myalgias.  Neurological:  Negative for dizziness and loss of consciousness.  Endo/Heme/Allergies:  Does not bruise/bleed easily.  Psychiatric/Behavioral:  Positive for memory loss and substance abuse.     Physical Exam/Data:   Vitals:   07/11/23 1145 07/11/23 1200 07/11/23 1214 07/11/23 1300  BP: (!) 164/94 (!) 153/97  (!) 158/98  Pulse: (!) 113 (!) 108  (!) 115  Resp: 16 19  15   Temp:    98.7 F (37.1  C)  TempSrc:    Oral  SpO2: 100% 100%  100%  Weight:   39.9 kg   Height:       No intake or output data in the 24 hours ending 07/11/23 1415    07/11/2023   12:14 PM 07/11/2023    1:21 AM 05/20/2023    2:28 PM  Last 3 Weights  Weight (lbs) 87 lb 14.4 oz 80 lb 81 lb  Weight (kg) 39.871 kg 36.288 kg 36.741 kg     Body mass index is 15.09 kg/m.  General: 77 y.o. thin frail Caucasian female resting comfortably in no acute distress. On 4L of O2 via nasal cannula. HEENT: Normocephalic and atraumatic. Sclera  clear.  Neck: Supple.  No JVD. Heart: Tachycardic with regular rhythm.No murmurs, gallops, or rubs. Radial pulses 2+ and equal bilaterally. Lungs: On 4L of O2 via nasal cannula. Clear to ausculation bilaterally. No wheezes, rhonchi, or rales.  Extremities: No lower extremity edema.    Skin: Warm and dry. Neuro: Alert and oriented x3. No focal deficits. Psych: Normal affect. Responds appropriately.   EKG:  The EKG was personally reviewed and demonstrates:  Sinus tachycardia, rate 101 bpm, with some underlying artifact but no significant ST/T changes.   Telemetry:  Telemetry was personally reviewed and demonstrates:  Sinus tachycardia with rates mostly in the 100s to 110s.  Relevant CV Studies:  Laboratory Data:  High Sensitivity Troponin:   Recent Labs  Lab 07/11/23 0841 07/11/23 1133  TROPONINIHS 2,449* 2,615*     Chemistry Recent Labs  Lab 07/11/23 0841  NA 135  K 3.1*  CL 90*  CO2 32  GLUCOSE 102*  BUN 24*  CREATININE 0.83  CALCIUM 9.0  GFRNONAA >60  ANIONGAP 13    Recent Labs  Lab 07/11/23 0841  PROT 7.7  ALBUMIN 4.4  AST 89*  ALT 78*  ALKPHOS 84  BILITOT 0.7   Lipids No results for input(s): "CHOL", "TRIG", "HDL", "LABVLDL", "LDLCALC", "CHOLHDL" in the last 168 hours.  Hematology Recent Labs  Lab 07/11/23 0841  WBC 10.0  RBC 3.87  HGB 11.0*  HCT 33.7*  MCV 87.1  MCH 28.4  MCHC 32.6  RDW 16.4*  PLT 262   Thyroid No results for input(s): "TSH", "FREET4" in the last 168 hours.  BNP Recent Labs  Lab 07/11/23 0841  BNP 1,494.1*    DDimer No results for input(s): "DDIMER" in the last 168 hours.   Radiology/Studies:  CT Angio Chest PE W/Cm &/Or Wo Cm Result Date: 07/11/2023 CLINICAL DATA:  Shortness of breath on home oxygen. Pulmonary embolism suspected, high probability. EXAM: CT ANGIOGRAPHY CHEST WITH CONTRAST TECHNIQUE: Multidetector CT imaging of the chest was performed using the standard protocol during bolus administration of intravenous  contrast. Multiplanar CT image reconstructions and MIPs were obtained to evaluate the vascular anatomy. RADIATION DOSE REDUCTION: This exam was performed according to the departmental dose-optimization program which includes automated exposure control, adjustment of the mA and/or kV according to patient size and/or use of iterative reconstruction technique. CONTRAST:  60mL OMNIPAQUE IOHEXOL 350 MG/ML SOLN COMPARISON:  Radiographs 07/11/2023 and 09/23/2022. Noncontrast chest CT 01/15/2023. Contrast enhanced chest CT 09/23/2022. FINDINGS: Cardiovascular: The pulmonary arteries are well opacified with contrast to the level of the segmental branches. There is no evidence of acute pulmonary embolism. Central enlargement of the pulmonary arteries consistent with pulmonary arterial hypertension. There is limited opacification of the aorta. No acute systemic arterial abnormalities are identified. Diffuse atherosclerosis of the aorta, great vessels  and coronary arteries with an aberrant retroesophageal right subclavian artery. 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 The heart size is normal. There is no pericardial effusion. Mediastinum/Nodes: There are no enlarged mediastinal, hilar or axillary lymph nodes.Grossly stable small subcarinal lymph nodes. The thyroid gland, trachea and esophagus demonstrate no significant findings. Lungs/Pleura: No pleural effusion or pneumothorax. Mild-to-moderate centrilobular and paraseptal emphysema with diffuse central airway thickening. Chronic radiation changes are again noted in both upper lobes adjacent to the fiducial markers. On the right, there is a possible developing nodular lesion at the apex with central cavitation, measuring approximately 1.8 x 1.7 cm on image 13/12. The left upper lobe radiation changes have not significantly changed. However, the previously demonstrated solid nodule in the superior segment of the left lower lobe has enlarged,  measuring 1.6 x 1.0 cm on image 50/12 (previously 0.9 x 0.8 cm. No other enlarging nodules are identified. Additional scattered small nodules are unchanged. Upper abdomen: No acute findings are seen in the visualized upper abdomen. Left renal cortical thinning and severe aortic atherosclerosis noted. Musculoskeletal/Chest wall: There is no chest wall mass or suspicious osseous finding. There are nonacute rib fractures bilaterally, including pathologic fractures of the right 2nd and 3rd ribs attributed to prior radiation therapy. Lower left sided rib fractures have occurred in the interval, but are partially healed. Chronic severe compression fracture at T3, grossly unchanged. Review of the MIP images confirms the above findings. IMPRESSION: 1. No evidence of acute pulmonary embolism or other acute vascular findings in the chest. 2. Chronic radiation changes in both upper lobes with possible developing nodular lesion at the right apex with central cavitation. 3. Enlarging solid nodule in the superior segment of the left lower lobe, suspicious for metastatic disease. 4. No adenopathy or other evidence of metastatic disease. Recommend short-term CT follow-. PET-CT could be considered. 5. Aortic Atherosclerosis (ICD10-I70.0) and Emphysema (ICD10-J43.9). Electronically Signed   By: Carey Bullocks M.D.   On: 07/11/2023 12:48   DG Chest 2 View Result Date: 07/11/2023 CLINICAL DATA:  Shortness of breath.  Lung cancer. EXAM: CHEST - 2 VIEW COMPARISON:  Chest x-ray 11/21/2021.  Chest CT 01/15/2023. FINDINGS: Fiduciary markers and bilateral upper lobe parenchymal opacities appear similar to prior CT given differences in technique. Left upper lobe nodule is also unchanged from prior CT. There is no new focal lung infiltrate, pleural effusion or pneumothorax identified. There are multiple healed left-sided rib fractures which appear new from prior. Cardiac silhouette is enlarged, unchanged. Surgical clips are seen in the  anterior left chest. IMPRESSION: 1. No acute cardiopulmonary process. 2. Fiduciary markers and bilateral upper lobe parenchymal opacities appear similar to prior CT given differences in technique. 3. Multiple healed left-sided rib fractures which appear new from prior. Electronically Signed   By: Darliss Cheney M.D.   On: 07/11/2023 03:25     Assessment and Plan:   NSTEMI Patient presented with worsening shortness of breath over the last week and was found to have an elevated troponin. High-sensitivity troponin 2,449 >> 2,615. EKG shows sinus tachycardia with slight ST depression in inferolateral leads an non-specific T wave changes. Chest CTA negative for PE but did show an enlarging solid nodule in the superior segment of the left lower lobe, suspicious for metastatic disease.  - She denies any chest pain.  - Will order Echo. - Continue IV Heparin. She has a history of ICH. Neurology okay with IV Heparin but recommended against using a bolus. - Continue aspirin and  statin. - Will start Lopressor 25mg  twice daily (only on once daily at home).  - Given frailty, memory issues, prior ICH, and enlarging lung nodule concerning for metastatic disease and the fact that she is chest pain free, will not rush her to the cath lab today. Will continue IV Heparin and get an Echo. We may just end up treating medically.   Chronic HFpEF Last Echo in 10/2021 showed LVEF of 55-60% with severe LVH and grade 1 diastolic dysfunction, normal RV, and no significant valvular disease. BNP elevated at 1,494 but no edema on chest x-ray or CTA. - Euvolemic on exam. - No need for IV diuresis but can resume home PO Lasix 20mg  daily.  PAD Patient has a long history of PAD with renal artery stenosis (s/p bilateral renal artery stenting), carotid disease (s/p right CEA), and mild lower extremity arterial disease (treated medically) - Continue aspirin and statin. - Can continue to follow as an outpatient.   Hypertension BP  markedly elevated on arrival with systolic BP as high as 189. Most recent BP 158/98.  - She was given one dose of Amlodipine 10mg  daily. Can continue this. - Will start Lopressor 25mg  twice daily as above. - She is on Cardura 8mg  daily at home but this has not been restarted. Can restart if needed.   Hyperlipidemia - Will increase home Crestor to 40mg  daily. - LFTs mildly elevated on admission (AST 89, ALT 78) so will need to monitor this closely. Will check fasting lipid panel and LFTs in the morning.  Hypokalemia Potassium 3.1 on admission.  - Repleted by primary team.  Prolonged QTc QTc >> 511 ms on admission.  - Potassium 3.1. Repleted.  - Will check Magneisum. - Avoid Qtc prolonging agents.  - Can recheck EKG in the morning.  Otherwise, per primary team: - COPD with chronic hypoxic respiratory failure on 3-4 L of O2 at home - Lung cancer  - Hypothyroidism - Type 2 diabetes mellitus - Chronic anemia  - Seizure disorder - History of ICH   Risk Assessment/Risk Scores:   TIMI Risk Score for Unstable Angina or Non-ST Elevation MI:   The patient's TIMI risk score is 4, which indicates a 20% risk of all cause mortality, new or recurrent myocardial infarction or need for urgent revascularization in the next 14 days.{   New York Heart Association (NYHA) Functional Class NYHA Class III-IV (mostly due to COPD)   For questions or updates, please contact [facility] Please consult [email or web address] for contact info under    Signed, Corrin Parker, PA-C  07/11/2023 2:15 PM   Attending Note:   The patient was seen and examined.  Agree with assessment and plan as noted above.  Changes made to the above note as needed.  Patient seen and independently examined with Virl Son, PA .   We discussed all aspects of the encounter. I agree with the assessment and plan as stated above.   Non-ST segment elevation myocardial infarction: Kendra Watts presents with shortness  of breath and some very minimal EKG changes-ST segment depressions.  She has troponin levels that are elevated but with a flat trend   ( 2499, 2615)  She denies any episodes of chest discomfort.  She is pain free.  She has noted increasing shortness of breath over the past several weeks.    She has significant vascular disease including carotid disease and peripheral arterial disease.  She has ongoing cigarette smoking and is on chronic home oxygen at  3 L/min.  CTA of the chest was negative for pulmonary embolism.  She has chronic radiation changes in both upper lobes.  She has an enlarging solid nodule in the superior segment of the left lower lobe that is suspicious for metastatic disease.  She has a history of a stroke with hemorrhagic transformation last year.  We initially had planned on sending her directly to the Cath Lab at [facility] but after reviewing the chart, it became quite clear that this was not a simple straightforward case that should go directly to the Cath Lab.  We checked with neurology and they have recommended caution with heparin therapy.  They gave the okay for heparin drip but with no bolus.  It would be very difficult to perform PCI y without the ability to give heparin boluses and other platelet inhibitors.     At this point we will get an echocardiogram to assess her LV function.  Will continue heparin drip for now.  Will contribute to continue to treat her medically including diuretics if needed, nitrates, ASA 81 mg a day   Will continue medical therapy.  2.  COPD: Further plans per the internal medicine team  3.  Lung nodule: She has a lung nodule but appears to be expanding.  This is consistent with metastatic disease.  Further evaluation per the medical team and oncology.  4.  Dementia: She has at least mild to moderate dementia.    I have spent a total of 40 minutes with patient reviewing hospital  notes , telemetry, EKGs, labs and examining patient as  well as establishing an assessment and plan that was discussed with the patient.  > 50% of time was spent in direct patient care.    Vesta Mixer, Montez Hageman., MD, [facility] 07/11/2023, 2:59 PM [address]  [address] Office [phone] Pager [phone]

## 2023-07-11 NOTE — ED Provider Notes (Signed)
[place] EMERGENCY DEPARTMENT AT [facility] Provider Note   CSN: [id number] Arrival date & time: 07/11/23  4010     History  Chief Complaint  Patient presents with  . Shortness of Breath    Kendra Watts is a 77 y.o. female.  Patient is a 77 year old female with past medical history of COPD on 3 L home O2, lung cancer status posttreatment, breast cancer status postlumpectomy, hypertension, hypothyroidism, prior ICH on Keppra presenting to the emergency department with shortness of breath.  The patient states over the last 2 days she has had increasing shortness of breath.  She denies any associated chest pain or fevers.  She states that she has a chronic cough from smoking and states this is no worse than usual.  Has had a little bit of congestion.  Denies any lower extremity swelling.  Denies any prior history of VTE or blood thinner use, recent hospitalization or surgery.  The history is provided by the patient.  Shortness of Breath      Home Medications Prior to Admission medications   Medication Sig Start Date End Date Taking? Authorizing Provider  aspirin 81 MG tablet Take 1 tablet (81 mg total) by mouth daily. 03/21/20  Yes Hongalgi, Maximino Greenland, MD  Budeson-Glycopyrrol-Formoterol (BREZTRI AEROSPHERE) 160-9-4.8 MCG/ACT AERO Inhale 2 puffs into the lungs in the morning and at bedtime. 06/12/21  Yes Leslye Peer, MD  doxazosin (CARDURA) 8 MG tablet Take 8 mg by mouth daily.   Yes [provider]  furosemide (LASIX) 20 MG tablet Take 20 mg by mouth daily.   Yes [provider]  levETIRAcetam (KEPPRA) 500 MG tablet Take 1 tablet (500 mg total) by mouth 2 (two) times daily. 05/20/23  Yes Lomax, Amy, NP  levothyroxine (SYNTHROID) 88 MCG tablet Take 88 mcg by mouth daily before breakfast.   Yes [provider]  metoprolol tartrate (LOPRESSOR) 25 MG tablet Take 25 mg by mouth daily. 09/21/20  Yes [provider]  pantoprazole  (PROTONIX) 40 MG tablet Take 1 tablet (40 mg total) by mouth daily. 07/14/18  Yes Dow Adolph N, DO  rosuvastatin (CRESTOR) 10 MG tablet Take 10 mg by mouth daily.   Yes [provider]  albuterol (VENTOLIN HFA) 108 (90 Base) MCG/ACT inhaler Inhale 2 puffs into the lungs every 6 (six) hours as needed for wheezing or shortness of breath. Patient not taking: Reported on 07/11/2023 11/17/20   Leslye Peer, MD  OXYGEN Inhale 2-4 L into the lungs as needed. As needed to maintain SATS > 90%    [provider]      Allergies    Patient has no known allergies.    Review of Systems   Review of Systems  Respiratory:  Positive for shortness of breath.     Physical Exam Updated Vital Signs BP (!) 153/97   Pulse (!) 108   Temp 98.4 F (36.9 C) (Oral)   Resp 19   Ht 5\' 4"  (1.626 m)   Wt 39.9 kg   SpO2 100%   BMI 15.09 kg/m  Physical Exam Vitals and nursing note reviewed.  Constitutional:      General: She is not in acute distress.    Comments: Frail appearing  HENT:     Head: Normocephalic and atraumatic.     Mouth/Throat:     Mouth: Mucous membranes are moist.     Pharynx: Oropharynx is clear.  Eyes:     Extraocular Movements: Extraocular movements intact.  Cardiovascular:     Rate and Rhythm: Regular rhythm. Tachycardia present.  Pulmonary:     Effort: Pulmonary effort is normal.     Breath sounds: Normal breath sounds.  Abdominal:     Palpations: Abdomen is soft.     Tenderness: There is no abdominal tenderness.  Musculoskeletal:        General: Normal range of motion.     Cervical back: Normal range of motion and neck supple.     Right lower leg: No edema.     Left lower leg: No edema.  Skin:    General: Skin is warm and dry.  Neurological:     General: No focal deficit present.     Mental Status: She is alert and oriented to person, place, and time.  Psychiatric:        Mood and Affect: Mood normal.        Behavior: Behavior normal.     ED  Results / Procedures / Treatments   Labs (all labs ordered are listed, but only abnormal results are displayed) Labs Reviewed  COMPREHENSIVE METABOLIC PANEL - Abnormal; Notable for the following components:      Result Value   Potassium 3.1 (*)    Chloride 90 (*)    Glucose, Bld 102 (*)    BUN 24 (*)    AST 89 (*)    ALT 78 (*)    All other components within normal limits  CBC WITH DIFFERENTIAL/PLATELET - Abnormal; Notable for the following components:   Hemoglobin 11.0 (*)    HCT 33.7 (*)    RDW 16.4 (*)    Neutro Abs 7.9 (*)    Abs Immature Granulocytes 0.08 (*)    All other components within normal limits  BRAIN NATRIURETIC PEPTIDE - Abnormal; Notable for the following components:   B Natriuretic Peptide 1,494.1 (*)    All other components within normal limits  TROPONIN I (HIGH SENSITIVITY) - Abnormal; Notable for the following components:   Troponin I (High Sensitivity) 2,449 (*)    All other components within normal limits  TROPONIN I (HIGH SENSITIVITY) - Abnormal; Notable for the following components:   Troponin I (High Sensitivity) 2,615 (*)    All other components within normal limits  RESP PANEL BY RT-PCR (RSV, FLU A&B, COVID)  RVPGX2  APTT  PROTIME-INR    EKG EKG Interpretation Date/Time:  Friday July 11 2023 10:41:24 EST Ventricular Rate:  113 PR Interval:  122 QRS Duration:  84 QT Interval:  372 QTC Calculation: 511 R Axis:   62  Text Interpretation: Sinus tachycardia Right atrial enlargement Borderline T abnormalities, anterior leads Prolonged QT interval Duplicate EKG Confirmed by Elayne Snare ([id number]) on 07/11/2023 10:56:58 AM  Radiology CT Angio Chest PE W/Cm &/Or Wo Cm Result Date: 07/11/2023 CLINICAL DATA:  Shortness of breath on home oxygen. Pulmonary embolism suspected, high probability. EXAM: CT ANGIOGRAPHY CHEST WITH CONTRAST TECHNIQUE: Multidetector CT imaging of the chest was performed using the standard protocol during bolus  administration of intravenous contrast. Multiplanar CT image reconstructions and MIPs were obtained to evaluate the vascular anatomy. RADIATION DOSE REDUCTION: This exam was performed according to the departmental dose-optimization program which includes automated exposure control, adjustment of the mA and/or kV according to patient size and/or use of iterative reconstruction technique. CONTRAST:  60mL OMNIPAQUE IOHEXOL 350 MG/ML SOLN COMPARISON:  Radiographs 07/11/2023 and 09/23/2022. Noncontrast chest CT 01/15/2023. Contrast enhanced chest CT 09/23/2022. FINDINGS: Cardiovascular: The pulmonary arteries are well opacified with contrast to  the level of the segmental branches. There is no evidence of acute pulmonary embolism. Central enlargement of the pulmonary arteries consistent with pulmonary arterial hypertension. There is limited opacification of the aorta. No acute systemic arterial abnormalities are identified. Diffuse atherosclerosis of the aorta, great vessels and coronary arteries with an aberrant retroesophageal right subclavian artery. 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 The heart size is normal. There is no pericardial effusion. Mediastinum/Nodes: There are no enlarged mediastinal, hilar or axillary lymph nodes.Grossly stable small subcarinal lymph nodes. The thyroid gland, trachea and esophagus demonstrate no significant findings. Lungs/Pleura: No pleural effusion or pneumothorax. Mild-to-moderate centrilobular and paraseptal emphysema with diffuse central airway thickening. Chronic radiation changes are again noted in both upper lobes adjacent to the fiducial markers. On the right, there is a possible developing nodular lesion at the apex with central cavitation, measuring approximately 1.8 x 1.7 cm on image 13/12. The left upper lobe radiation changes have not significantly changed. However, the previously demonstrated solid nodule in the superior segment of the left  lower lobe has enlarged, measuring 1.6 x 1.0 cm on image 50/12 (previously 0.9 x 0.8 cm. No other enlarging nodules are identified. Additional scattered small nodules are unchanged. Upper abdomen: No acute findings are seen in the visualized upper abdomen. Left renal cortical thinning and severe aortic atherosclerosis noted. Musculoskeletal/Chest wall: There is no chest wall mass or suspicious osseous finding. There are nonacute rib fractures bilaterally, including pathologic fractures of the right 2nd and 3rd ribs attributed to prior radiation therapy. Lower left sided rib fractures have occurred in the interval, but are partially healed. Chronic severe compression fracture at T3, grossly unchanged. Review of the MIP images confirms the above findings. IMPRESSION: 1. No evidence of acute pulmonary embolism or other acute vascular findings in the chest. 2. Chronic radiation changes in both upper lobes with possible developing nodular lesion at the right apex with central cavitation. 3. Enlarging solid nodule in the superior segment of the left lower lobe, suspicious for metastatic disease. 4. No adenopathy or other evidence of metastatic disease. Recommend short-term CT follow-. PET-CT could be considered. 5. Aortic Atherosclerosis (ICD10-I70.0) and Emphysema (ICD10-J43.9). Electronically Signed   By: Carey Bullocks M.D.   On: 07/11/2023 12:48   DG Chest 2 View Result Date: 07/11/2023 CLINICAL DATA:  Shortness of breath.  Lung cancer. EXAM: CHEST - 2 VIEW COMPARISON:  Chest x-ray 11/21/2021.  Chest CT 01/15/2023. FINDINGS: Fiduciary markers and bilateral upper lobe parenchymal opacities appear similar to prior CT given differences in technique. Left upper lobe nodule is also unchanged from prior CT. There is no new focal lung infiltrate, pleural effusion or pneumothorax identified. There are multiple healed left-sided rib fractures which appear new from prior. Cardiac silhouette is enlarged, unchanged. Surgical  clips are seen in the anterior left chest. IMPRESSION: 1. No acute cardiopulmonary process. 2. Fiduciary markers and bilateral upper lobe parenchymal opacities appear similar to prior CT given differences in technique. 3. Multiple healed left-sided rib fractures which appear new from prior. Electronically Signed   By: Darliss Cheney M.D.   On: 07/11/2023 03:25    Procedures .Critical Care  Performed by: Rexford Maus, DO Authorized by: Rexford Maus, DO   Critical care provider statement:    Critical care time (minutes):  30   Critical care was necessary to treat or prevent imminent or life-threatening deterioration of the following conditions:  Cardiac failure   Critical care was time spent personally by me on  the following activities:  Development of treatment plan with patient or surrogate, discussions with consultants, evaluation of patient's response to treatment, examination of patient, ordering and review of laboratory studies, ordering and review of radiographic studies, ordering and performing treatments and interventions, pulse oximetry, re-evaluation of patient's condition and review of old charts   I assumed direction of critical care for this patient from another provider in my specialty: no     Care discussed with: admitting provider       Medications Ordered in ED Medications  0.9% sodium chloride infusion (has no administration in time range)    Followed by  0.9% sodium chloride infusion (has no administration in time range)  aspirin chewable tablet 324 mg (324 mg Oral Given 07/11/23 1047)  potassium chloride SA (KLOR-CON M) CR tablet 40 mEq (40 mEq Oral Given 07/11/23 1047)  iohexol (OMNIPAQUE) 350 MG/ML injection 60 mL (60 mLs Intravenous Contrast Given 07/11/23 1055)  amLODipine (NORVASC) tablet 10 mg (10 mg Oral Given 07/11/23 1249)  metoprolol tartrate (LOPRESSOR) tablet 25 mg (25 mg Oral Given 07/11/23 1249)    ED Course/ Medical Decision Making/  A&P Clinical Course as of 07/11/23 1315  Fri Jul 11, 2023  1030 Received report of critical troponin 2449, BNP also elevated to 1491. Will repeat EKG. No acute ischemic changes on initial EKG in triage. Will be given aspirin pending CTPE study. Plan to discuss with cardiology and initiate heparin after PE study. [VK]  1143 CT pending read, no obvious large PE on my evaluation. Will consult cardiology. Repeat troponin in process. [VK]  1151 I spoke with Dr. Elease Hashimoto with cardiology who recommended starting heparin and admission to [facility] for NSTEMI. Likely plan for cath today. Recommend medicine admission. [VK]  1235 Mild increase in repeat troponin. Cardiology recommended speaking to neurology regarding heparin dosing with history of prior ICH. [VK]  1315 Dr. Selina Cooley states ok for typical therapeutic range, just no bolus for heparin. [VK]    Clinical Course User Index [VK] Rexford Maus, DO                                 Medical Decision Making This patient presents to the ED with chief complaint(s) of SOB with pertinent past medical history of COPD on 3L O2, CHF, prior lung and breast cancer, hypothyroidism, prior ICH on Keppra which further complicates the presenting complaint. The complaint involves an extensive differential diagnosis and also carries with it a high risk of complications and morbidity.    The differential diagnosis includes patient has no wheezing on exam making COPD exacerbation unlikely, considering ACS, arrhythmia, anemia, pneumonia, pneumothorax, pulmonary edema, pleural effusion, considering PE she is tachycardic and with her cancer history, viral syndrome  Additional history obtained: Additional history obtained from N/a Records reviewed outpatient neurology and oncology records  ED Course and Reassessment: On patient's arrival she is tachycardic and otherwise hemodynamically stable, satting well on her home O2.  Initially had EKG and chest x-ray performed in  triage.  Chest x-ray showed no acute disease, EKG showed sinus tachycardia without acute ischemic changes.  Patient will additionally have labs and CT PE study to further evaluate for cause of her shortness of breath and she will be closely reassessed.  Independent labs interpretation:  The following labs were independently interpreted: elevated troponin 2400 -> 2600, elevated BNP 1400, mildly elevated potassium  Independent visualization of imaging: - I independently visualized the  following imaging with scope of interpretation limited to determining acute life threatening conditions related to emergency care: CXR, CTPE, which revealed no large PE, no acute disease on CXR  Consultation: - Consulted or discussed management/test interpretation w/ external professional: hospitalist, cardiology, neurology  Consideration for admission or further workup: patient requires admission for NSTEMI Social Determinants of health: N/A    Amount and/or Complexity of Data Reviewed Labs: ordered. Radiology: ordered.  Risk OTC drugs. Prescription drug management. Decision regarding hospitalization.          Final Clinical Impression(s) / ED Diagnoses Final diagnoses:  NSTEMI (non-ST elevated myocardial infarction) [facility])    Rx / DC Orders ED Discharge Orders     None         Rexford Maus, DO 07/11/23 1303

## 2023-07-11 NOTE — Progress Notes (Signed)
PHARMACY - ANTICOAGULATION CONSULT NOTE  Pharmacy Consult for IV heparin Indication: NSTEMI  No Known Allergies  Patient Measurements: Height: 5\' 4"  (162.6 cm) Weight: 39.9 kg (87 lb 14.4 oz) IBW/kg (Calculated) : 54.7 Heparin Dosing Weight: total body weight   Vital Signs: Temp: 98.4 F (36.9 C) (12/20 0905) Temp Source: Oral (12/20 0905) BP: 164/94 (12/20 1145) Pulse Rate: 113 (12/20 1145)  Labs: Recent Labs    07/11/23 0841  HGB 11.0*  HCT 33.7*  PLT 262  CREATININE 0.83  TROPONINIHS 2,449*    Estimated Creatinine Clearance: 35.8 mL/min (by C-G formula based on SCr of 0.83 mg/dL).   Medical History: Past Medical History:  Diagnosis Date   Anemia    Aortic arch anomaly    arteria lusoria    Breast cancer (HCC) 09/22/2015   Malignant   Breast cancer of upper-outer quadrant of left female breast (HCC) 09/08/2015   Cataract, immature    CHF (congestive heart failure) (HCC)    Acute CHF-06/2018   COPD (chronic obstructive pulmonary disease) (HCC)    Encephalopathy acute 11/12/2021   Femur fracture, left (HCC) 04/11/2020   left femur fracture( greater trochanter   Heart murmur    states no known problems   History of cervical spine x-ray 11/13/2021   History of hyperthyroidism    History of radiation therapy    bilateral lungs - 10/27/18-11/02/18, Dr. Antony Blackbird   History of radiation therapy 11/28/2015   left breast 10/30/2015-11/28/2015   Dr Antony Blackbird   Hyperlipidemia    Hypertension    states under control with meds., has been on med. x "long time"   Hypokalemia    from last physical.    Hypothyroidism    Nonfunctioning kidney    left   Personal history of radiation therapy    2017   Pulmonary nodules    Bilateral   Radiation 10/30/15-11/28/15   left breast 42.72 Gy, boosted to 10 Gy   Renal artery stenosis (HCC)    Tobacco abuse    Wears partial dentures    upper and lower     Assessment: 15 y/oM with PMH of PAD with renal artery stenosis,  carotid stenosis, bilateral iliac disease, HFpEF, COPD, HTN, HLD, hypothyroidism, lung cancer,  intracerebral hemorrhage in 11/2021 felt to be a hemorrhagic transformation of stroke, and status epilepticus in the setting of intraparenchymal hemorrhage who presented to [facility] on 07/11/2023 with SOB. High sensitivity troponin 2449 > 2615. CTa chest negative for PE. Pharmacy consulted for IV heparin dosing for ACS/NSTEMI. CBC: Hgb 11, Plt 262. PT/INR 13.9/1.1, aPTT 28 seconds. Patient noted to be on aspirin 81mg  daily PTA, but not on any anticoagulants. Case was discussed by ED Provider with Neurology, who recommended no heparin boluses, but ok for full heparin goal range - Cardiology and Hospitalist aware.   Goal of Therapy:  Heparin level 0.3-0.7 units/ml Monitor platelets by anticoagulation protocol: Yes   Plan:  No heparin bolus per Neurology  Start IV heparin infusion at 450 units/hr Heparin level 8 hours after initiation Daily CBC, heparin level Monitor closely for s/sx of bleeding    Greer Pickerel, PharmD, BCPS Clinical Pharmacist 07/11/2023,12:21 PM

## 2023-07-11 NOTE — Progress Notes (Signed)
PHARMACY - ANTICOAGULATION CONSULT NOTE  Pharmacy Consult for IV heparin Indication: NSTEMI  No Known Allergies  Patient Measurements: Height: 5\' 4"  (162.6 cm) Weight: 39.9 kg (87 lb 14.4 oz) IBW/kg (Calculated) : 54.7 Heparin Dosing Weight: total body weight   Vital Signs: Temp: 98.4 F (36.9 C) (12/20 1953) Temp Source: Oral (12/20 1953) BP: 149/86 (12/20 1953) Pulse Rate: 93 (12/20 1953)  Labs: Recent Labs    07/11/23 0841 07/11/23 1133 07/11/23 2130  HGB 11.0*  --   --   HCT 33.7*  --   --   PLT 262  --   --   APTT 28  --   --   LABPROT 13.9  --   --   INR 1.1  --   --   HEPARINUNFRC  --   --  <0.10*  CREATININE 0.83  --   --   TROPONINIHS 2,449* 2,615*  --     Estimated Creatinine Clearance: 35.8 mL/min (by C-G formula based on SCr of 0.83 mg/dL).   Medical History: Past Medical History:  Diagnosis Date   Anemia    Aortic arch anomaly    arteria lusoria    Breast cancer (HCC) 09/22/2015   Malignant   Breast cancer of upper-outer quadrant of left female breast (HCC) 09/08/2015   Cataract, immature    CHF (congestive heart failure) (HCC)    Acute CHF-06/2018   COPD (chronic obstructive pulmonary disease) (HCC)    Encephalopathy acute 11/12/2021   Femur fracture, left (HCC) 04/11/2020   left femur fracture( greater trochanter   Heart murmur    states no known problems   History of cervical spine x-ray 11/13/2021   History of hyperthyroidism    History of radiation therapy    bilateral lungs - 10/27/18-11/02/18, Dr. Antony Blackbird   History of radiation therapy 11/28/2015   left breast 10/30/2015-11/28/2015   Dr Antony Blackbird   Hyperlipidemia    Hypertension    states under control with meds., has been on med. x "long time"   Hypokalemia    from last physical.    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 High sensitivity troponin 2449 > 2615. CTa chest negative for PE. Pharmacy consulted for IV heparin dosing for ACS/NSTEMI. CBC: Hgb 11, Plt 262. PT/INR 13.9/1.1, aPTT 28 seconds. Patient noted to be on aspirin 81mg  daily PTA, but not on any anticoagulants. Case was discussed by ED Provider with Neurology, who recommended no heparin boluses, but ok for full heparin goal range - Cardiology and Hospitalist aware.    07/11/23 Heparin level < 0.1 (subtherapeutic) with heparin gtt @ 450 units/hr RN confirmed no interruption in therapy and no line issues No bleeding complications reported by RN  Goal of Therapy:  Heparin level 0.3-0.7 units/ml Monitor platelets by anticoagulation protocol: Yes  Plan:  No heparin bolus per Neurology  Increase heparin infusion to 600 units/hr Heparin level 8 hours after rate increase Daily CBC, heparin level Monitor closely for s/sx of bleeding    Terrilee Files, PharmD Clinical Pharmacist 07/11/2023,10:44 PM

## 2023-07-11 NOTE — ED Triage Notes (Signed)
Pt BIB [facility] from home for SOB. Pt is on 3L [place] at home for lung cancer & COPD.

## 2023-07-11 NOTE — H&P (Addendum)
History and Physical  ALVEDA PARDEN RUE:[id number] DOB: 1946-05-28 DOA: 07/11/2023  PCP: Georgianne Fick, MD   Chief Complaint: Shortness of breath  HPI: Kendra Watts is a 77 y.o. female with medical history significant for COPD on chronic oxygen, peripheral arterial disease with renal artery stenosis status post renal artery stenting in 2011 and 2016, carotid stenosis, bilateral iliac disease as well as chronic heart failure with preserved EF, hypertension, hypothyroidism, and treated lung cancer being admitted to the hospital with NSTEMI.  She presented to the emergency department complaining of 2 days of shortness of breath, without any associated chest pain, or fevers.  She has a chronic cough and this is per usual.  She is on her chronic 3 L nasal cannula oxygen.  Denies any new significant wheezing.  She is not on any blood thinners, no prior history of thromboembolic disease.  Evaluation in the ER as noted below, shows significantly elevated troponin.  ER provider discussed with cardiology, who recommends admission to the hospitalist service at [facility], with anticipated cardiac catheterization today.  Review of Systems: Please see HPI for pertinent positives and negatives. A complete 10 system review of systems are otherwise negative.  Past Medical History:  Diagnosis Date   Anemia    Aortic arch anomaly    arteria lusoria    Breast cancer (HCC) 09/22/2015   Malignant   Breast cancer of upper-outer quadrant of left female breast (HCC) 09/08/2015   Cataract, immature    CHF (congestive heart failure) (HCC)    Acute CHF-06/2018   COPD (chronic obstructive pulmonary disease) (HCC)    Encephalopathy acute 11/12/2021   Femur fracture, left (HCC) 04/11/2020   left femur fracture( greater trochanter   Heart murmur    states no known problems   History of cervical spine x-ray 11/13/2021   History of hyperthyroidism    History of radiation therapy    bilateral lungs -  10/27/18-11/02/18, Dr. Antony Blackbird   History of radiation therapy 11/28/2015   left breast 10/30/2015-11/28/2015   Dr Antony Blackbird   Hyperlipidemia    Hypertension    states under control with meds., has been on med. x "long time"   Hypokalemia    from last physical.    Hypothyroidism    Nonfunctioning kidney    left   Personal history of radiation therapy    2017   Pulmonary nodules    Bilateral   Radiation 10/30/15-11/28/15   left breast 42.72 Gy, boosted to 10 Gy   Renal artery stenosis (HCC)    Tobacco abuse    Wears partial dentures    upper and lower   Past Surgical History:  Procedure Laterality Date   ABDOMINAL ANGIOGRAM  02/18/2012   Procedure: ABDOMINAL ANGIOGRAM;  Surgeon: Runell Gess, MD;  Location: [facility] CATH LAB;  Service: Cardiovascular;;   ABDOMINAL AORTAGRAM  07/04/2014   ABDOMINAL HYSTERECTOMY  ~ 1977   partial   APPENDECTOMY     ARCH AORTOGRAM     BREAST LUMPECTOMY Left 09/22/2015   Malignant   CAROTID ANGIOGRAM N/A 02/18/2012   Procedure: CAROTID ANGIOGRAM;  Surgeon: Runell Gess, MD;  Location: [facility] CATH LAB;  Service: Cardiovascular;  Laterality: N/A;   ENDARTERECTOMY  04/02/2012   Procedure: ENDARTERECTOMY CAROTID;  Surgeon: Nada Libman, MD;  Location: [facility] OR;  Service: Vascular;  Laterality: Right;   FUDUCIAL PLACEMENT Bilateral 09/02/2018   Procedure: Placement Of Fiducial to right upper lobe & left upper lobe lung;  Surgeon:  Leslye Peer, MD;  Location: [facility] OR;  Service: Thoracic;  Laterality: Bilateral;   IR THORACENTESIS ASP PLEURAL SPACE W/IMG GUIDE  07/08/2018   RADIOACTIVE SEED GUIDED PARTIAL MASTECTOMY WITH AXILLARY SENTINEL LYMPH NODE BIOPSY Left 09/22/2015   Procedure: INJECT BLUE DYE LEFT BREAST,RADIOACTIVE SEED GUIDED PARTIAL MASTECTOMY WITH AXILLARY SENTINEL LYMPH NODE BIOPSY;  Surgeon: Claud Kelp, MD;  Location: [facility];  Service: General;  Laterality: Left;   RENAL ANGIOGRAM Left 06/08/2010   renal artery stent -   5x12 Genesis on Aviator balloon stent (Dr. Erlene Quan)   RENAL ANGIOGRAM Right 07/04/2014   Procedure: RENAL ANGIOGRAM;  Surgeon: Runell Gess, MD;  Location[facility] C[facility] LAB;  Service: Cardiovascular;  Laterality: Right;   RENAL ANGIOGRAM Right 08/22/2014   Procedure: RENAL ANGIOGRAM;  Surgeon: Runell Gess, MD;  Location: [facility] CATH LAB;  Service: Cardiovascular;  Laterality: Right;   TONSILLECTOMY     as a child   VIDEO BRONCHOSCOPY WITH ENDOBRONCHIAL NAVIGATION N/A 09/02/2018   Procedure: VIDEO BRONCHOSCOPY WITH ENDOBRONCHIAL NAVIGATION;  Surgeon: Leslye Peer, MD;  Location: [facility] OR;  Service: Thoracic;  Laterality: N/A;    Social History:  reports that she has been smoking cigarettes. She has a 20 pack-year smoking history. She has never used smokeless tobacco. She reports current alcohol use of about 4.0 standard drinks of alcohol per week. She reports that she does not use drugs.   No Known Allergies  Family History  Problem Relation Age of Onset   Heart disease Mother        MI @ 70, died at 88   Cancer Father    Lung cancer Father    Lung cancer Sister    Breast cancer Paternal Aunt    Heart disease Maternal Grandmother    Stroke Neg Hx      Prior to Admission medications   Medication Sig Start Date End Date Taking? Authorizing Provider  aspirin 81 MG tablet Take 1 tablet (81 mg total) by mouth daily. 03/21/20  Yes Hongalgi, Maximino Greenland, MD  Budeson-Glycopyrrol-Formoterol (BREZTRI AEROSPHERE) 160-9-4.8 MCG/ACT AERO Inhale 2 puffs into the lungs in the morning and at bedtime. 06/12/21  Yes Leslye Peer, MD  doxazosin (CARDURA) 8 MG tablet Take 8 mg by mouth daily.   Yes [provider]  furosemide (LASIX) 20 MG tablet Take 20 mg by mouth daily.   Yes [provider]  levETIRAcetam (KEPPRA) 500 MG tablet Take 1 tablet (500 mg total) by mouth 2 (two) times daily. 05/20/23  Yes Lomax, Amy, NP  levothyroxine (SYNTHROID) 88 MCG tablet Take 88 mcg by mouth daily  before breakfast.   Yes [provider]  metoprolol tartrate (LOPRESSOR) 25 MG tablet Take 25 mg by mouth daily. 09/21/20  Yes [provider]  pantoprazole (PROTONIX) 40 MG tablet Take 1 tablet (40 mg total) by mouth daily. 07/14/18  Yes Dow Adolph N, DO  rosuvastatin (CRESTOR) 10 MG tablet Take 10 mg by mouth daily.   Yes [provider]  albuterol (VENTOLIN HFA) 108 (90 Base) MCG/ACT inhaler Inhale 2 puffs into the lungs every 6 (six) hours as needed for wheezing or shortness of breath. Patient not taking: Reported on 07/11/2023 11/17/20   Leslye Peer, MD  OXYGEN Inhale 2-4 L into the lungs as needed. As needed to maintain SATS > 90%    [provider]    Physical Exam: BP (!) 153/97   Pulse (!) 108   Temp 98.4  F (36.9 C) (Oral)   Resp 19   Ht 5\' 4"  (1.626 m)   Wt 39.9 kg   SpO2 100%   BMI 15.09 kg/m   General:  Alert, oriented, calm, in no acute distress  Eyes: EOMI, clear conjuctivae, white sclerea Neck: supple, no masses, trachea mildline  Cardiovascular: RRR, no murmurs or rubs, no peripheral edema  Respiratory: clear to auscultation bilaterally but globally diminished, no wheezes, no crackles  Abdomen: soft, nontender, nondistended, normal bowel tones heard  Skin: dry, no rashes  Musculoskeletal: no joint effusions, normal range of motion  Psychiatric: appropriate affect, normal speech  Neurologic: extraocular muscles intact, clear speech, moving all extremities with intact sensorium         Labs on Admission:  Basic Metabolic Panel: Recent Labs  Lab 07/11/23 0841  NA 135  K 3.1*  CL 90*  CO2 32  GLUCOSE 102*  BUN 24*  CREATININE 0.83  CALCIUM 9.0   Liver Function Tests: Recent Labs  Lab 07/11/23 0841  AST 89*  ALT 78*  ALKPHOS 84  BILITOT 0.7  PROT 7.7  ALBUMIN 4.4   No results for input(s): "LIPASE", "AMYLASE" in the last 168 hours. No results for input(s): "AMMONIA" in the last 168 hours. CBC: Recent Labs   Lab 07/11/23 0841  WBC 10.0  NEUTROABS 7.9*  HGB 11.0*  HCT 33.7*  MCV 87.1  PLT 262   Cardiac Enzymes: No results for input(s): "CKTOTAL", "CKMB", "CKMBINDEX", "TROPONINI" in the last 168 hours.  BNP (last 3 results) Recent Labs    07/11/23 0841  BNP 1,494.1*    ProBNP (last 3 results) No results for input(s): "PROBNP" in the last 8760 hours.  CBG: No results for input(s): "GLUCAP" in the last 168 hours.  Radiological Exams on Admission: CT Angio Chest PE W/Cm &/Or Wo Cm Result Date: 07/11/2023 CLINICAL DATA:  Shortness of breath on home oxygen. Pulmonary embolism suspected, high probability. EXAM: CT ANGIOGRAPHY CHEST WITH CONTRAST TECHNIQUE: Multidetector CT imaging of the chest was performed using the standard protocol during bolus administration of intravenous contrast. Multiplanar CT image reconstructions and MIPs were obtained to evaluate the vascular anatomy. RADIATION DOSE REDUCTION: This exam was performed according to the departmental dose-optimization program which includes automated exposure control, adjustment of the mA and/or kV according to patient size and/or use of iterative reconstruction technique. CONTRAST:  60mL OMNIPAQUE IOHEXOL 350 MG/ML SOLN COMPARISON:  Radiographs 07/11/2023 and 09/23/2022. Noncontrast chest CT 01/15/2023. Contrast enhanced chest CT 09/23/2022. FINDINGS: Cardiovascular: The pulmonary arteries are well opacified with contrast to the level of the segmental branches. There is no evidence of acute pulmonary embolism. Central enlargement of the pulmonary arteries consistent with pulmonary arterial hypertension. There is limited opacification of the aorta. No acute systemic arterial abnormalities are identified. Diffuse atherosclerosis of the aorta, great vessels and coronary arteries with an aberrant retroesophageal right subclavian artery. 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 The heart size is normal. There is no  pericardial effusion. Mediastinum/Nodes: There are no enlarged mediastinal, hilar or axillary lymph nodes.Grossly stable small subcarinal lymph nodes. The thyroid gland, trachea and esophagus demonstrate no significant findings. Lungs/Pleura: No pleural effusion or pneumothorax. Mild-to-moderate centrilobular and paraseptal emphysema with diffuse central airway thickening. Chronic radiation changes are again noted in both upper lobes adjacent to the fiducial markers. On the right, there is a possible developing nodular lesion at the apex with central cavitation, measuring approximately 1.8 x 1.7 cm on image 13/12. The left upper lobe  radiation changes have not significantly changed. However, the previously demonstrated solid nodule in the superior segment of the left lower lobe has enlarged, measuring 1.6 x 1.0 cm on image 50/12 (previously 0.9 x 0.8 cm. No other enlarging nodules are identified. Additional scattered small nodules are unchanged. Upper abdomen: No acute findings are seen in the visualized upper abdomen. Left renal cortical thinning and severe aortic atherosclerosis noted. Musculoskeletal/Chest wall: There is no chest wall mass or suspicious osseous finding. There are nonacute rib fractures bilaterally, including pathologic fractures of the right 2nd and 3rd ribs attributed to prior radiation therapy. Lower left sided rib fractures have occurred in the interval, but are partially healed. Chronic severe compression fracture at T3, grossly unchanged. Review of the MIP images confirms the above findings. IMPRESSION: 1. No evidence of acute pulmonary embolism or other acute vascular findings in the chest. 2. Chronic radiation changes in both upper lobes with possible developing nodular lesion at the right apex with central cavitation. 3. Enlarging solid nodule in the superior segment of the left lower lobe, suspicious for metastatic disease. 4. No adenopathy or other evidence of metastatic disease.  Recommend short-term CT follow-. PET-CT could be considered. 5. Aortic Atherosclerosis (ICD10-I70.0) and Emphysema (ICD10-J43.9). Electronically Signed   By: Carey Bullocks M.D.   On: 07/11/2023 12:48   DG Chest 2 View Result Date: 07/11/2023 CLINICAL DATA:  Shortness of breath.  Lung cancer. EXAM: CHEST - 2 VIEW COMPARISON:  Chest x-ray 11/21/2021.  Chest CT 01/15/2023. FINDINGS: Fiduciary markers and bilateral upper lobe parenchymal opacities appear similar to prior CT given differences in technique. Left upper lobe nodule is also unchanged from prior CT. There is no new focal lung infiltrate, pleural effusion or pneumothorax identified. There are multiple healed left-sided rib fractures which appear new from prior. Cardiac silhouette is enlarged, unchanged. Surgical clips are seen in the anterior left chest. IMPRESSION: 1. No acute cardiopulmonary process. 2. Fiduciary markers and bilateral upper lobe parenchymal opacities appear similar to prior CT given differences in technique. 3. Multiple healed left-sided rib fractures which appear new from prior. Electronically Signed   By: Darliss Cheney M.D.   On: 07/11/2023 03:25    Assessment/Plan Kendra Watts is a 77 y.o. female with medical history significant for COPD on chronic oxygen, peripheral arterial disease with renal artery stenosis status post renal artery stenting in 2011 and 2016, carotid stenosis, bilateral iliac disease as well as chronic heart failure with preserved EF, hypertension, hypothyroidism, and treated lung cancer being admitted to the hospital with NSTEMI.   NSTEMI-with increased dyspnea, elevated troponin.  Possible question of T wave flattening on today's EKG. 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 -Was given 324 mg ASA this morning as well as 25 mg p.o. Lopressor which  will also be continued -Cardiology consultation and workup appreciated -Per cardiology, plan is for medical management at [facility]  Elevated troponin-suspected due to NSTEMI, continue to trend  Pulmonary nodule-enlarging, now 1.6 cm in the superior segment of the left lower lobe.  Worrisome for recurrent/metastatic disease.  Will likely need pulmonology workup once stable from a cardiac perspective.  COPD-chronically on 3 L nasal cannula oxygen, currently stable without any evidence of acute exacerbation  Seizure disorder-with history of stroke, continue Keppra  Hyperlipidemia-Crestor  Hypokalemia-likely due to taking Lasix at home, repleted, follow-up with morning labs  Hypothyroidism-continue Synthroid 88 mcg daily  Code Status: Full Code-patient was previously DNR when admitted in 2023 and suffered hemorrhagic conversion of acute CVA.  Extensive discussion today, and patient states she would like to be full code.  Consults called: Cardiology  Admission status: The appropriate patient status for this patient is INPATIENT. Inpatient status is judged to be reasonable and necessary in order to provide the required intensity of service to ensure the patient's safety. The patient's presenting symptoms, physical exam findings, and initial radiographic and laboratory data in the context of their chronic comorbidities is felt to place them at high risk for further clinical deterioration. Furthermore, it is not anticipated that the patient will be medically stable for discharge from the hospital within 2 midnights of admission.    I certify that at the point of admission it is my clinical judgment that the patient will require inpatient hospital care spanning beyond 2 midnights from the point of admission due to high intensity of service, high risk for further deterioration and high frequency of surveillance required  Time spent: 59 minutes  Asriel Westrup Sharlette Dense MD [facility] Pager  [phone]  If 7PM-7AM, please contact night-coverage [email or web address] Password [email or web address]  07/11/2023, 1:02 PM

## 2023-07-11 NOTE — ED Notes (Signed)
Patient transported to CT 

## 2023-07-11 NOTE — ED Notes (Signed)
Pt is A&ox4, but is having delusions stating that construction is being done outside her room on the wall, no construction is being done. Dr. Kirby Crigler notified

## 2023-07-11 NOTE — ED Notes (Signed)
ED TO INPATIENT HANDOFF REPORT  ED Nurse Name and Phone #: Kendra Watts Name/Age/Gender Kendra Watts 77 y.o. female Room/Bed: WA14/WA14  Code Status   Code Status: Full Code  Home/SNF/Other Home Patient oriented to: self, place, time, and situation Is this baseline? Yes   Triage Complete: Triage complete  Chief Complaint NSTEMI (non-ST elevated myocardial infarction) (HCC) [I21.4]  Triage Note Pt BIB [facility] from home for SOB. Pt is on 3L [place] at home for lung cancer & COPD.   Allergies No Known Allergies  Level of Care/Admitting Diagnosis ED Disposition     ED Disposition  Admit   Condition  --   Comment  Hospital Area: [facility] [[id number]]  Level of Care: Progressive [102]  Admit to Progressive based on following criteria: CARDIOVASCULAR & THORACIC of moderate stability with acute coronary syndrome symptoms/low risk myocardial infarction/hypertensive urgency/arrhythmias/heart failure potentially compromising stability and stable post cardiovascular intervention patients.  May admit patient to [facility] or [facility] if equivalent level of care is available:: Yes  Covid Evaluation: Confirmed COVID Negative  Diagnosis: NSTEMI (non-ST elevated myocardial infarction) [facility]) [[id number]]  Admitting Physician: Maryln Gottron [[id number]]  Attending Physician: Kirby Crigler, MIR [email or web address] [[id number]]  Certification:: I certify this patient will need inpatient services for at least 2 midnights          B Medical/Surgery History Past Medical History:  Diagnosis Date   Anemia    Aortic arch anomaly    arteria lusoria    Breast cancer (HCC) 09/22/2015   Malignant   Breast cancer of upper-outer quadrant of left female breast (HCC) 09/08/2015   Cataract, immature    CHF (congestive heart failure) (HCC)    Acute CHF-06/2018   COPD (chronic obstructive pulmonary disease) (HCC)    Encephalopathy acute 11/12/2021   Femur fracture, left (HCC) 04/11/2020   left femur  fracture( greater trochanter   Heart murmur    states no known problems   History of cervical spine x-ray 11/13/2021   History of hyperthyroidism    History of radiation therapy    bilateral lungs - 10/27/18-11/02/18, Dr. Antony Blackbird   History of radiation therapy 11/28/2015   left breast 10/30/2015-11/28/2015   Dr Antony Blackbird   Hyperlipidemia    Hypertension    states under control with meds., has been on med. x "long time"   Hypokalemia    from last physical.    Hypothyroidism    Nonfunctioning kidney    left   Personal history of radiation therapy    2017   Pulmonary nodules    Bilateral   Radiation 10/30/15-11/28/15   left breast 42.72 Gy, boosted to 10 Gy   Renal artery stenosis (HCC)    Tobacco abuse    Wears partial dentures    upper and lower   Past Surgical History:  Procedure Laterality Date   ABDOMINAL ANGIOGRAM  02/18/2012   Procedure: ABDOMINAL ANGIOGRAM;  Surgeon: Runell Gess, MD;  Location: [facility] CATH LAB;  Service: Cardiovascular;;   ABDOMINAL AORTAGRAM  07/04/2014   ABDOMINAL HYSTERECTOMY  ~ 1977   partial   APPENDECTOMY     ARCH AORTOGRAM     BREAST LUMPECTOMY Left 09/22/2015   Malignant   CAROTID ANGIOGRAM N/A 02/18/2012   Procedure: CAROTID ANGIOGRAM;  Surgeon: Runell Gess, MD;  Location: [facility] CATH LAB;  Service: Cardiovascular;  Laterality: N/A;   ENDARTERECTOMY  04/02/2012   Procedure: ENDARTERECTOMY CAROTID;  Surgeon: Nada Libman, MD;  Location: [facility]  OR;  Service: Vascular;  Laterality: Right;   FUDUCIAL PLACEMENT Bilateral 09/02/2018   Procedure: Placement Of Fiducial to right upper lobe & left upper lobe lung;  Surgeon: Leslye Peer, MD;  Location: [facility] OR;  Service: Thoracic;  Laterality: Bilateral;   IR THORACENTESIS ASP PLEURAL SPACE W/IMG GUIDE  07/08/2018   RADIOACTIVE SEED GUIDED PARTIAL MASTECTOMY WITH AXILLARY SENTINEL LYMPH NODE BIOPSY Left 09/22/2015   Procedure: INJECT BLUE DYE LEFT BREAST,RADIOACTIVE SEED GUIDED PARTIAL MASTECTOMY WITH  AXILLARY SENTINEL LYMPH NODE BIOPSY;  Surgeon: Claud Kelp, MD;  Location: [facility];  Service: General;  Laterality: Left;   RENAL ANGIOGRAM Left 06/08/2010   renal artery stent -  5x12 Genesis on Aviator balloon stent (Dr. Erlene Quan)   RENAL ANGIOGRAM Right 07/04/2014   Procedure: RENAL ANGIOGRAM;  Surgeon: Runell Gess, MD;  Location: [facility] CATH LAB;  Service: Cardiovascular;  Laterality: Right;   RENAL ANGIOGRAM Right 08/22/2014   Procedure: RENAL ANGIOGRAM;  Surgeon: Runell Gess, MD;  Location: [facility] CATH LAB;  Service: Cardiovascular;  Laterality: Right;   TONSILLECTOMY     as a child   VIDEO BRONCHOSCOPY WITH ENDOBRONCHIAL NAVIGATION N/A 09/02/2018   Procedure: VIDEO BRONCHOSCOPY WITH ENDOBRONCHIAL NAVIGATION;  Surgeon: Leslye Peer, MD;  Location: [facility] OR;  Service: Thoracic;  Laterality: N/A;     A IV Location/Drains/Wounds Patient Lines/Drains/Airways Status     Active Line/Drains/Airways     Name Placement date Placement time Site Days   Peripheral IV 07/11/23 20 G Anterior;Left Forearm 07/11/23  0900  Forearm  less than 1   Peripheral IV 07/11/23 20 G Anterior;Right Forearm 07/11/23  1328  Forearm  less than 1   Wound / Incision (Open or Dehisced) 11/12/21 Skin tear Arm Lower;Posterior;Right 11/12/21  1930  Arm  606            Intake/Output Last 24 hours No intake or output data in the 24 hours ending 07/11/23 1755  Labs/Imaging Results for orders placed or performed during the hospital encounter of 07/11/23 (from the past 48 hours)  Comprehensive metabolic panel     Status: Abnormal   Collection Time: 07/11/23  8:41 AM  Result Value Ref Range   Sodium 135 135 - 145 mmol/L   Potassium 3.1 (L) 3.5 - 5.1 mmol/L   Chloride 90 (L) 98 - 111 mmol/L   CO2 32 22 - 32 mmol/L   Glucose, Bld 102 (H) 70 - 99 mg/dL    Comment: Glucose reference range applies only to samples taken after fasting for at least 8 hours.   BUN 24 (H) 8 - 23 mg/dL   Creatinine,  Ser 2.44 0.44 - 1.00 mg/dL   Calcium 9.0 8.9 - 01.0 mg/dL   Total Protein 7.7 6.5 - 8.1 g/dL   Albumin 4.4 3.5 - 5.0 g/dL   AST 89 (H) 15 - 41 U/L   ALT 78 (H) 0 - 44 U/L   Alkaline Phosphatase 84 38 - 126 U/L   Total Bilirubin 0.7 <1.2 mg/dL   GFR, Estimated >27 >25 mL/min    Comment: (NOTE) Calculated using the CKD-EPI Creatinine Equation (2021)    Anion gap 13 5 - 15    Comment: Performed at [facility], [address] [place], [place] [address]  CBC with Differential     Status: Abnormal   Collection Time: 07/11/23  8:41 AM  Result Value Ref Range   WBC 10.0 4.0 - 10.5 K/uL   RBC 3.87  3.87 - 5.11 MIL/uL   Hemoglobin 11.0 (L) 12.0 - 15.0 g/dL   HCT 09.8 (L) 11.9 - 14.7 %   MCV 87.1 80.0 - 100.0 fL   MCH 28.4 26.0 - 34.0 pg   MCHC 32.6 30.0 - 36.0 g/dL   RDW 82.9 (H) 56.2 - 13.0 %   Platelets 262 150 - 400 K/uL   nRBC 0.0 0.0 - 0.2 %   Neutrophils Relative % 77 %   Neutro Abs 7.9 (H) 1.7 - 7.7 K/uL   Lymphocytes Relative 9 %   Lymphs Abs 0.9 0.7 - 4.0 K/uL   Monocytes Relative 10 %   Monocytes Absolute 1.0 0.1 - 1.0 K/uL   Eosinophils Relative 2 %   Eosinophils Absolute 0.2 0.0 - 0.5 K/uL   Basophils Relative 1 %   Basophils Absolute 0.1 0.0 - 0.1 K/uL   Immature Granulocytes 1 %   Abs Immature Granulocytes 0.08 (H) 0.00 - 0.07 K/uL    Comment: Performed at [facility], [address], [place], [place] [address]  Troponin I (High Sensitivity)     Status: Abnormal   Collection Time: 07/11/23  8:41 AM  Result Value Ref Range   Troponin I (High Sensitivity) 2,449 (HH) <18 ng/L    Comment: CRITICAL RESULT CALLED TO, READ BACK BY AND VERIFIED WITH ESTRADA, G RN @ 1028 ON 07/11/23 CAL (NOTE) Elevated high sensitivity troponin I (hsTnI) values and significant  changes across serial measurements may suggest ACS but many other  chronic and acute conditions are known to elevate hsTnI results.  Refer to the "Links" section for chest  pain algorithms and additional  guidance. Performed at [facility], [address], [place], [place] [address]   Brain natriuretic peptide     Status: Abnormal   Collection Time: 07/11/23  8:41 AM  Result Value Ref Range   B Natriuretic Peptide 1,494.1 (H) 0.0 - 100.0 pg/mL    Comment: Performed at [facility], [address], [place], [place] [address]  APTT     Status: None   Collection Time: 07/11/23  8:41 AM  Result Value Ref Range   aPTT 28 24 - 36 seconds    Comment: Performed at [facility], [address], [place], [place] [address]  Protime-INR     Status: None   Collection Time: 07/11/23  8:41 AM  Result Value Ref Range   Prothrombin Time 13.9 11.4 - 15.2 seconds   INR 1.1 0.8 - 1.2    Comment: (NOTE) INR goal varies based on device and disease states. Performed at [facility], [address] [place], [place] [address]   Resp panel by RT-PCR (RSV, Flu A&B, Covid) Anterior Nasal Swab     Status: None   Collection Time: 07/11/23  9:03 AM   Specimen: Anterior Nasal Swab  Result Value Ref Range   SARS Coronavirus 2 by RT PCR NEGATIVE NEGATIVE    Comment: (NOTE) SARS-CoV-2 target nucleic acids are NOT DETECTED.  The SARS-CoV-2 RNA is generally detectable in upper respiratory specimens during the acute phase of infection. The lowest concentration of SARS-CoV-2 viral copies this assay can detect is 138 copies/mL. A negative result does not preclude SARS-Cov-2 infection and should not be used as the sole basis for treatment or other patient management decisions. A negative result may occur with  improper specimen collection/handling, submission of specimen other than nasopharyngeal swab, presence of viral mutation(s) within the areas targeted  by this assay, and inadequate number of viral copies(<138 copies/mL). A negative result must be combined with clinical observations, patient history,  and epidemiological information. The expected result is Negative.  Fact Sheet for Patients:  BloggerCourse.com  Fact Sheet for Healthcare Providers:  SeriousBroker.it  This test is no t yet approved or cleared by the [place] FDA and  has been authorized for detection and/or diagnosis of SARS-CoV-2 by FDA under an Emergency Use Authorization (EUA). This EUA will remain  in effect (meaning this test can be used) for the duration of the COVID-19 declaration under Section 564(b)(1) of the Act, 21 U.S.C.section 360bbb-3(b)(1), unless the authorization is terminated  or revoked sooner.       Influenza A by PCR NEGATIVE NEGATIVE   Influenza B by PCR NEGATIVE NEGATIVE    Comment: (NOTE) The Xpert Xpress SARS-CoV-2/FLU/RSV plus assay is intended as an aid in the diagnosis of influenza from Nasopharyngeal swab specimens and should not be used as a sole basis for treatment. Nasal washings and aspirates are unacceptable for Xpert Xpress SARS-CoV-2/FLU/RSV testing.  Fact Sheet for Patients: BloggerCourse.com  Fact Sheet for Healthcare Providers: SeriousBroker.it  This test is not yet approved or cleared by the [place] FDA and has been authorized for detection and/or diagnosis of SARS-CoV-2 by FDA under an Emergency Use Authorization (EUA). This EUA will remain in effect (meaning this test can be used) for the duration of the COVID-19 declaration under Section 564(b)(1) of the Act, 21 U.S.C. section 360bbb-3(b)(1), unless the authorization is terminated or revoked.     Resp Syncytial Virus by PCR NEGATIVE NEGATIVE    Comment: (NOTE) Fact Sheet for Patients: BloggerCourse.com  Fact Sheet for Healthcare Providers: SeriousBroker.it  This test is not yet approved or cleared by the [place] FDA and has been authorized for  detection and/or diagnosis of SARS-CoV-2 by FDA under an Emergency Use Authorization (EUA). This EUA will remain in effect (meaning this test can be used) for the duration of the COVID-19 declaration under Section 564(b)(1) of the Act, 21 U.S.C. section 360bbb-3(b)(1), unless the authorization is terminated or revoked.  Performed at [facility], [address], [place], [place] [address]   Troponin I (High Sensitivity)     Status: Abnormal   Collection Time: 07/11/23 11:33 AM  Result Value Ref Range   Troponin I (High Sensitivity) 2,615 (HH) <18 ng/L    Comment: CRITICAL RESULT CALLED TO, READ BACK BY AND VERIFIED WITH BRUNSON, T RN ON 07/11/23 AT 1234 BY GOLSONM (NOTE) Elevated high sensitivity troponin I (hsTnI) values and significant  changes across serial measurements may suggest ACS but many other  chronic and acute conditions are known to elevate hsTnI results.  Refer to the "Links" section for chest pain algorithms and additional  guidance. Performed at [facility], [address] [place], [place] [address]    CT Angio Chest PE W/Cm &/Or Wo Cm Result Date: 07/11/2023 CLINICAL DATA:  Shortness of breath on home oxygen. Pulmonary embolism suspected, high probability. EXAM: CT ANGIOGRAPHY CHEST WITH CONTRAST TECHNIQUE: Multidetector CT imaging of the chest was performed using the standard protocol during bolus administration of intravenous contrast. Multiplanar CT image reconstructions and MIPs were obtained to evaluate the vascular anatomy. RADIATION DOSE REDUCTION: This exam was performed according to the departmental dose-optimization program which includes automated exposure control, adjustment of the mA and/or kV according to patient size and/or use of iterative reconstruction technique. CONTRAST:  60mL OMNIPAQUE IOHEXOL 350 MG/ML SOLN  COMPARISON:  Radiographs 07/11/2023 and 09/23/2022. Noncontrast chest CT 01/15/2023. Contrast enhanced  chest CT 09/23/2022. FINDINGS: Cardiovascular: The pulmonary arteries are well opacified with contrast to the level of the segmental branches. There is no evidence of acute pulmonary embolism. Central enlargement of the pulmonary arteries consistent with pulmonary arterial hypertension. There is limited opacification of the aorta. No acute systemic arterial abnormalities are identified. Diffuse atherosclerosis of the aorta, great vessels and coronary arteries with an aberrant retroesophageal right subclavian artery. 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 The heart size is normal. There is no pericardial effusion. Mediastinum/Nodes: There are no enlarged mediastinal, hilar or axillary lymph nodes.Grossly stable small subcarinal lymph nodes. The thyroid gland, trachea and esophagus demonstrate no significant findings. Lungs/Pleura: No pleural effusion or pneumothorax. Mild-to-moderate centrilobular and paraseptal emphysema with diffuse central airway thickening. Chronic radiation changes are again noted in both upper lobes adjacent to the fiducial markers. On the right, there is a possible developing nodular lesion at the apex with central cavitation, measuring approximately 1.8 x 1.7 cm on image 13/12. The left upper lobe radiation changes have not significantly changed. However, the previously demonstrated solid nodule in the superior segment of the left lower lobe has enlarged, measuring 1.6 x 1.0 cm on image 50/12 (previously 0.9 x 0.8 cm. No other enlarging nodules are identified. Additional scattered small nodules are unchanged. Upper abdomen: No acute findings are seen in the visualized upper abdomen. Left renal cortical thinning and severe aortic atherosclerosis noted. Musculoskeletal/Chest wall: There is no chest wall mass or suspicious osseous finding. There are nonacute rib fractures bilaterally, including pathologic fractures of the right 2nd and 3rd ribs attributed to prior  radiation therapy. Lower left sided rib fractures have occurred in the interval, but are partially healed. Chronic severe compression fracture at T3, grossly unchanged. Review of the MIP images confirms the above findings. IMPRESSION: 1. No evidence of acute pulmonary embolism or other acute vascular findings in the chest. 2. Chronic radiation changes in both upper lobes with possible developing nodular lesion at the right apex with central cavitation. 3. Enlarging solid nodule in the superior segment of the left lower lobe, suspicious for metastatic disease. 4. No adenopathy or other evidence of metastatic disease. Recommend short-term CT follow-. PET-CT could be considered. 5. Aortic Atherosclerosis (ICD10-I70.0) and Emphysema (ICD10-J43.9). Electronically Signed   By: Carey Bullocks M.D.   On: 07/11/2023 12:48   DG Chest 2 View Result Date: 07/11/2023 CLINICAL DATA:  Shortness of breath.  Lung cancer. EXAM: CHEST - 2 VIEW COMPARISON:  Chest x-ray 11/21/2021.  Chest CT 01/15/2023. FINDINGS: Fiduciary markers and bilateral upper lobe parenchymal opacities appear similar to prior CT given differences in technique. Left upper lobe nodule is also unchanged from prior CT. There is no new focal lung infiltrate, pleural effusion or pneumothorax identified. There are multiple healed left-sided rib fractures which appear new from prior. Cardiac silhouette is enlarged, unchanged. Surgical clips are seen in the anterior left chest. IMPRESSION: 1. No acute cardiopulmonary process. 2. Fiduciary markers and bilateral upper lobe parenchymal opacities appear similar to prior CT given differences in technique. 3. Multiple healed left-sided rib fractures which appear new from prior. Electronically Signed   By: Darliss Cheney M.D.   On: 07/11/2023 03:25    Pending Labs Unresulted Labs (From admission, onward)     Start     Ordered   07/12/23 1416  Lipid panel  Once,   R        07/11/23  1416   07/12/23 0500  Hepatic  function panel  Tomorrow morning,   R        07/11/23 1416   07/12/23 0500  CBC  Daily,   R      07/11/23 1532   07/11/23 2200  Heparin level (unfractionated)  Once-Timed,   TIMED        07/11/23 1532   Signed and Held  Basic metabolic panel  Tomorrow morning,   R        Signed and Held   Signed and Held  CBC  Tomorrow morning,   R        Signed and Held            Vitals/Pain Today's Vitals   07/11/23 1515 07/11/23 1518 07/11/23 1530 07/11/23 1700  BP: (!) 137/95 139/85 134/86 127/86  Pulse: 80 84 80 79  Resp: 20  18 17   Temp:      TempSrc:      SpO2: 100%  100% 100%  Weight:      Height:      PainSc:        Isolation Precautions No active isolations  Medications Medications  0.9% sodium chloride infusion (0 mL/kg/hr  39.9 kg Intravenous Stopped 07/11/23 1519)    Followed by  0.9% sodium chloride infusion (1 mL/kg/hr  39.9 kg Intravenous New Bag/Given 07/11/23 1519)  heparin ADULT infusion 100 units/mL (25000 units/269mL) (450 Units/hr Intravenous New Bag/Given 07/11/23 1343)  metoprolol tartrate (LOPRESSOR) tablet 25 mg (25 mg Oral Given 07/11/23 1518)  rosuvastatin (CRESTOR) tablet 40 mg (40 mg Oral Given 07/11/23 1517)  aspirin EC tablet 81 mg (has no administration in time range)  aspirin chewable tablet 324 mg (324 mg Oral Given 07/11/23 1047)  potassium chloride SA (KLOR-CON M) CR tablet 40 mEq (40 mEq Oral Given 07/11/23 1047)  iohexol (OMNIPAQUE) 350 MG/ML injection 60 mL (60 mLs Intravenous Contrast Given 07/11/23 1055)  amLODipine (NORVASC) tablet 10 mg (10 mg Oral Given 07/11/23 1249)  metoprolol tartrate (LOPRESSOR) tablet 25 mg (25 mg Oral Given 07/11/23 1249)    Mobility walks     Focused Assessments     R Recommendations: See Admitting Provider Note  Report given to:   Additional Notes:

## 2023-07-12 ENCOUNTER — Inpatient Hospital Stay (HOSPITAL_COMMUNITY): Payer: Medicare Other

## 2023-07-12 DIAGNOSIS — I5033 Acute on chronic diastolic (congestive) heart failure: Secondary | ICD-10-CM

## 2023-07-12 DIAGNOSIS — I214 Non-ST elevation (NSTEMI) myocardial infarction: Secondary | ICD-10-CM

## 2023-07-12 LAB — ECHOCARDIOGRAM COMPLETE
AR max vel: 1.82 cm2
AV Area VTI: 2 cm2
AV Area mean vel: 1.46 cm2
AV Mean grad: 2 mm[Hg]
AV Peak grad: 3.8 mm[Hg]
Ao pk vel: 0.97 m/s
Area-P 1/2: 2.95 cm2
Calc EF: 56.5 %
Height: 64 in
MV VTI: 1.85 cm2
P 1/2 time: 493 ms
S' Lateral: 3.2 cm
Single Plane A2C EF: 63.4 %
Single Plane A4C EF: 48.7 %
Weight: 1406.4 [oz_av]

## 2023-07-12 LAB — CBC
HCT: 32.7 % — ABNORMAL LOW (ref 36.0–46.0)
Hemoglobin: 9.9 g/dL — ABNORMAL LOW (ref 12.0–15.0)
MCH: 27.8 pg (ref 26.0–34.0)
MCHC: 30.3 g/dL (ref 30.0–36.0)
MCV: 91.9 fL (ref 80.0–100.0)
Platelets: 238 10*3/uL (ref 150–400)
RBC: 3.56 MIL/uL — ABNORMAL LOW (ref 3.87–5.11)
RDW: 16.4 % — ABNORMAL HIGH (ref 11.5–15.5)
WBC: 9.1 10*3/uL (ref 4.0–10.5)
nRBC: 0 % (ref 0.0–0.2)

## 2023-07-12 LAB — HEPARIN LEVEL (UNFRACTIONATED)
Heparin Unfractionated: 0.12 [IU]/mL — ABNORMAL LOW (ref 0.30–0.70)
Heparin Unfractionated: 0.25 [IU]/mL — ABNORMAL LOW (ref 0.30–0.70)
Heparin Unfractionated: 0.28 [IU]/mL — ABNORMAL LOW (ref 0.30–0.70)

## 2023-07-12 LAB — HEPATIC FUNCTION PANEL
ALT: 63 U/L — ABNORMAL HIGH (ref 0–44)
AST: 62 U/L — ABNORMAL HIGH (ref 15–41)
Albumin: 3.5 g/dL (ref 3.5–5.0)
Alkaline Phosphatase: 68 U/L (ref 38–126)
Bilirubin, Direct: 0.1 mg/dL (ref 0.0–0.2)
Indirect Bilirubin: 0.7 mg/dL (ref 0.3–0.9)
Total Bilirubin: 0.8 mg/dL (ref <1.2)
Total Protein: 6.1 g/dL — ABNORMAL LOW (ref 6.5–8.1)

## 2023-07-12 LAB — LIPID PANEL
Cholesterol: 133 mg/dL (ref 0–200)
HDL: 78 mg/dL (ref >40)
LDL Cholesterol: 45 mg/dL (ref 0–99)
Total CHOL/HDL Ratio: 1.7 {ratio}
Triglycerides: 52 mg/dL (ref <150)
VLDL: 10 mg/dL (ref 0–40)

## 2023-07-12 LAB — BASIC METABOLIC PANEL
Anion gap: 10 (ref 5–15)
BUN: 14 mg/dL (ref 8–23)
CO2: 31 mmol/L (ref 22–32)
Calcium: 8.2 mg/dL — ABNORMAL LOW (ref 8.9–10.3)
Chloride: 99 mmol/L (ref 98–111)
Creatinine, Ser: 0.61 mg/dL (ref 0.44–1.00)
GFR, Estimated: >60 mL/min (ref >60)
Glucose, Bld: 86 mg/dL (ref 70–99)
Potassium: 3.2 mmol/L — ABNORMAL LOW (ref 3.5–5.1)
Sodium: 140 mmol/L (ref 135–145)

## 2023-07-12 LAB — TROPONIN I (HIGH SENSITIVITY): Troponin I (High Sensitivity): 1371 ng/L (ref <18)

## 2023-07-12 MED ORDER — MAGNESIUM OXIDE -MG SUPPLEMENT 400 (240 MG) MG PO TABS
400.0000 mg | ORAL_TABLET | Freq: Once | ORAL | Status: AC
  Administered 2023-07-12: 400 mg via ORAL
  Filled 2023-07-12: qty 1

## 2023-07-12 MED ORDER — ASPIRIN 81 MG PO TABS: 81.0000 mg | ORAL_TABLET | Freq: Every day | ORAL | Status: DC

## 2023-07-12 MED ORDER — TRAZODONE HCL 50 MG PO TABS
50.0000 mg | ORAL_TABLET | Freq: Every evening | ORAL | Status: DC | PRN
  Administered 2023-07-12: 50 mg via ORAL
  Filled 2023-07-12: qty 1

## 2023-07-12 MED ORDER — POTASSIUM CHLORIDE 10 MEQ/100ML IV SOLN
10.0000 meq | INTRAVENOUS | Status: AC
  Administered 2023-07-12 (×3): 10 meq via INTRAVENOUS
  Filled 2023-07-12 (×3): qty 100

## 2023-07-12 MED ORDER — ONDANSETRON HCL 4 MG/2ML IJ SOLN: 4.0000 mg | Freq: Four times a day (QID) | INTRAMUSCULAR | Status: DC | PRN

## 2023-07-12 MED ORDER — LORAZEPAM 2 MG/ML IJ SOLN
1.0000 mg | Freq: Once | INTRAMUSCULAR | Status: DC
  Filled 2023-07-12: qty 1

## 2023-07-12 MED ORDER — ACETAMINOPHEN 325 MG PO TABS: 650.0000 mg | ORAL_TABLET | Freq: Four times a day (QID) | ORAL | Status: DC | PRN

## 2023-07-12 MED ORDER — ALBUTEROL SULFATE (2.5 MG/3ML) 0.083% IN NEBU: 2.5000 mg | INHALATION_SOLUTION | RESPIRATORY_TRACT | Status: DC | PRN

## 2023-07-12 MED ORDER — POTASSIUM CHLORIDE CRYS ER 20 MEQ PO TBCR
40.0000 meq | EXTENDED_RELEASE_TABLET | Freq: Four times a day (QID) | ORAL | Status: AC
  Administered 2023-07-12: 40 meq via ORAL
  Filled 2023-07-12 (×2): qty 2

## 2023-07-12 MED ORDER — POTASSIUM CHLORIDE CRYS ER 20 MEQ PO TBCR: 40.0000 meq | EXTENDED_RELEASE_TABLET | Freq: Once | ORAL | Status: DC

## 2023-07-12 MED ORDER — LEVETIRACETAM 500 MG PO TABS
500.0000 mg | ORAL_TABLET | Freq: Two times a day (BID) | ORAL | Status: DC
Start: 2023-07-12 — End: 2023-07-18
  Administered 2023-07-12 – 2023-07-18 (×14): 500 mg via ORAL
  Filled 2023-07-12 (×14): qty 1

## 2023-07-12 MED ORDER — MOMETASONE FURO-FORMOTEROL FUM 100-5 MCG/ACT IN AERO
2.0000 | INHALATION_SPRAY | Freq: Two times a day (BID) | RESPIRATORY_TRACT | Status: DC
  Administered 2023-07-12 – 2023-07-18 (×11): 2 via RESPIRATORY_TRACT
  Filled 2023-07-12: qty 8.8

## 2023-07-12 MED ORDER — PANTOPRAZOLE SODIUM 40 MG PO TBEC
40.0000 mg | DELAYED_RELEASE_TABLET | Freq: Every day | ORAL | Status: DC
Start: 2023-07-12 — End: 2023-07-18
  Administered 2023-07-12 – 2023-07-18 (×7): 40 mg via ORAL
  Filled 2023-07-12 (×7): qty 1

## 2023-07-12 MED ORDER — FUROSEMIDE 10 MG/ML IJ SOLN
20.0000 mg | Freq: Every day | INTRAMUSCULAR | Status: DC
Start: 2023-07-12 — End: 2023-07-14
  Administered 2023-07-12 – 2023-07-14 (×3): 20 mg via INTRAVENOUS
  Filled 2023-07-12 (×3): qty 2

## 2023-07-12 MED ORDER — ACETAMINOPHEN 650 MG RE SUPP: 650.0000 mg | Freq: Four times a day (QID) | RECTAL | Status: DC | PRN

## 2023-07-12 MED ORDER — LORAZEPAM 2 MG/ML IJ SOLN
1.0000 mg | Freq: Four times a day (QID) | INTRAMUSCULAR | Status: DC | PRN
  Administered 2023-07-12 – 2023-07-16 (×3): 1 mg via INTRAVENOUS
  Filled 2023-07-12 (×2): qty 1

## 2023-07-12 MED ORDER — POTASSIUM CHLORIDE CRYS ER 20 MEQ PO TBCR: 40.0000 meq | EXTENDED_RELEASE_TABLET | Freq: Two times a day (BID) | ORAL | Status: DC

## 2023-07-12 MED ORDER — LEVOTHYROXINE SODIUM 88 MCG PO TABS
88.0000 ug | ORAL_TABLET | Freq: Every day | ORAL | Status: DC
  Administered 2023-07-12 – 2023-07-18 (×7): 88 ug via ORAL
  Filled 2023-07-12 (×7): qty 1

## 2023-07-12 MED ORDER — MORPHINE SULFATE (PF) 2 MG/ML IV SOLN: 2.0000 mg | INTRAVENOUS | Status: DC | PRN

## 2023-07-12 MED ORDER — UMECLIDINIUM BROMIDE 62.5 MCG/ACT IN AEPB
1.0000 | INHALATION_SPRAY | Freq: Every day | RESPIRATORY_TRACT | Status: DC
  Administered 2023-07-12 – 2023-07-18 (×7): 1 via RESPIRATORY_TRACT
  Filled 2023-07-12: qty 7

## 2023-07-12 MED ORDER — ONDANSETRON HCL 4 MG PO TABS
4.0000 mg | ORAL_TABLET | Freq: Four times a day (QID) | ORAL | Status: DC | PRN
Start: 2023-07-12 — End: 2023-07-12

## 2023-07-12 MED ORDER — BUDESON-GLYCOPYRROL-FORMOTEROL 160-9-4.8 MCG/ACT IN AERO: 2.0000 | INHALATION_SPRAY | Freq: Two times a day (BID) | RESPIRATORY_TRACT | Status: DC

## 2023-07-12 NOTE — Progress Notes (Addendum)
Rounding Note    Patient Name: Kendra Watts Date of Encounter: 07/12/2023  [facility] Cardiologist: Nanetta Batty, MD   Subjective   BP 129/69.  SpO2 97% on 3 L.  Creatinine stable at 0.6, potassium 3.2.  She denies chest pain or dyspnea  Inpatient Medications    Scheduled Meds:  aspirin EC  81 mg Oral Daily   levETIRAcetam  500 mg Oral BID   levothyroxine  88 mcg Oral QAC breakfast   metoprolol tartrate  25 mg Oral BID   mometasone-formoterol  2 puff Inhalation BID   And   umeclidinium bromide  1 puff Inhalation Daily   pantoprazole  40 mg Oral Daily   rosuvastatin  40 mg Oral Daily   Continuous Infusions:  sodium chloride 1 mL/kg/hr (07/11/23 1519)   heparin 750 Units/hr (07/12/23 0904)   PRN Meds: acetaminophen **OR** acetaminophen, albuterol, morphine injection, ondansetron **OR** ondansetron (ZOFRAN) IV, traZODone   Vital Signs    Vitals:   07/12/23 0341 07/12/23 0846 07/12/23 0856 07/12/23 0908  BP: 138/82 129/69  129/69  Pulse: 85 78  78  Resp: 15     Temp: 98 F (36.7 C) 98.8 F (37.1 C)    TempSrc: Oral Oral    SpO2: 96% 95% 97%   Weight:      Height:        Intake/Output Summary (Last 24 hours) at 07/12/2023 0956 Last data filed at 07/12/2023 0700 Gross per 24 hour  Intake 398.47 ml  Output 600 ml  Net -201.53 ml      07/11/2023   12:14 PM 07/11/2023    1:21 AM 05/20/2023    2:28 PM  Last 3 Weights  Weight (lbs) 87 lb 14.4 oz 80 lb 81 lb  Weight (kg) 39.871 kg 36.288 kg 36.741 kg      Telemetry    Normal sinus rhythm- Personally Reviewed  ECG    Normal sinus rhythm, nonspecific T wave flattening, QTc 519- Personally Reviewed  Physical Exam   GEN: No acute distress.   Neck: + JVD Cardiac: RRR, no murmurs, rubs, or gallops.  Respiratory: Diminished breath sounds GI: Soft, nontender, non-distended  MS: No edema; No deformity. Neuro:  Nonfocal  Psych: Normal affect   Labs    High Sensitivity Troponin:    Recent Labs  Lab 07/11/23 0841 07/11/23 1133 07/12/23 0742  TROPONINIHS 2,449* 2,615* 1,371*     Chemistry Recent Labs  Lab 07/11/23 0841 07/12/23 0742  NA 135 140  K 3.1* 3.2*  CL 90* 99  CO2 32 31  GLUCOSE 102* 86  BUN 24* 14  CREATININE 0.83 0.61  CALCIUM 9.0 8.2*  PROT 7.7 6.1*  ALBUMIN 4.4 3.5  AST 89* 62*  ALT 78* 63*  ALKPHOS 84 68  BILITOT 0.7 0.8  GFRNONAA >60 >60  ANIONGAP 13 10    Lipids No results for input(s): "CHOL", "TRIG", "HDL", "LABVLDL", "LDLCALC", "CHOLHDL" in the last 168 hours.  Hematology Recent Labs  Lab 07/11/23 0841 07/12/23 0742  WBC 10.0 9.1  RBC 3.87 3.56*  HGB 11.0* 9.9*  HCT 33.7* 32.7*  MCV 87.1 91.9  MCH 28.4 27.8  MCHC 32.6 30.3  RDW 16.4* 16.4*  PLT 262 238   Thyroid No results for input(s): "TSH", "FREET4" in the last 168 hours.  BNP Recent Labs  Lab 07/11/23 0841  BNP 1,494.1*    DDimer No results for input(s): "DDIMER" in the last 168 hours.   Radiology  CT Angio Chest PE W/Cm &/Or Wo Cm Result Date: 07/11/2023 CLINICAL DATA:  Shortness of breath on home oxygen. Pulmonary embolism suspected, high probability. EXAM: CT ANGIOGRAPHY CHEST WITH CONTRAST TECHNIQUE: Multidetector CT imaging of the chest was performed using the standard protocol during bolus administration of intravenous contrast. Multiplanar CT image reconstructions and MIPs were obtained to evaluate the vascular anatomy. RADIATION DOSE REDUCTION: This exam was performed according to the departmental dose-optimization program which includes automated exposure control, adjustment of the mA and/or kV according to patient size and/or use of iterative reconstruction technique. CONTRAST:  60mL OMNIPAQUE IOHEXOL 350 MG/ML SOLN COMPARISON:  Radiographs 07/11/2023 and 09/23/2022. Noncontrast chest CT 01/15/2023. Contrast enhanced chest CT 09/23/2022. FINDINGS: Cardiovascular: The pulmonary arteries are well opacified with contrast to the level of the segmental  branches. There is no evidence of acute pulmonary embolism. Central enlargement of the pulmonary arteries consistent with pulmonary arterial hypertension. There is limited opacification of the aorta. No acute systemic arterial abnormalities are identified. Diffuse atherosclerosis of the aorta, great vessels and coronary arteries with an aberrant retroesophageal right subclavian artery. 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 The heart size is normal. There is no pericardial effusion. Mediastinum/Nodes: There are no enlarged mediastinal, hilar or axillary lymph nodes.Grossly stable small subcarinal lymph nodes. The thyroid gland, trachea and esophagus demonstrate no significant findings. Lungs/Pleura: No pleural effusion or pneumothorax. Mild-to-moderate centrilobular and paraseptal emphysema with diffuse central airway thickening. Chronic radiation changes are again noted in both upper lobes adjacent to the fiducial markers. On the right, there is a possible developing nodular lesion at the apex with central cavitation, measuring approximately 1.8 x 1.7 cm on image 13/12. The left upper lobe radiation changes have not significantly changed. However, the previously demonstrated solid nodule in the superior segment of the left lower lobe has enlarged, measuring 1.6 x 1.0 cm on image 50/12 (previously 0.9 x 0.8 cm. No other enlarging nodules are identified. Additional scattered small nodules are unchanged. Upper abdomen: No acute findings are seen in the visualized upper abdomen. Left renal cortical thinning and severe aortic atherosclerosis noted. Musculoskeletal/Chest wall: There is no chest wall mass or suspicious osseous finding. There are nonacute rib fractures bilaterally, including pathologic fractures of the right 2nd and 3rd ribs attributed to prior radiation therapy. Lower left sided rib fractures have occurred in the interval, but are partially healed. Chronic severe compression  fracture at T3, grossly unchanged. Review of the MIP images confirms the above findings. IMPRESSION: 1. No evidence of acute pulmonary embolism or other acute vascular findings in the chest. 2. Chronic radiation changes in both upper lobes with possible developing nodular lesion at the right apex with central cavitation. 3. Enlarging solid nodule in the superior segment of the left lower lobe, suspicious for metastatic disease. 4. No adenopathy or other evidence of metastatic disease. Recommend short-term CT follow-. PET-CT could be considered. 5. Aortic Atherosclerosis (ICD10-I70.0) and Emphysema (ICD10-J43.9). Electronically Signed   By: Carey Bullocks M.D.   On: 07/11/2023 12:48   DG Chest 2 View Result Date: 07/11/2023 CLINICAL DATA:  Shortness of breath.  Lung cancer. EXAM: CHEST - 2 VIEW COMPARISON:  Chest x-ray 11/21/2021.  Chest CT 01/15/2023. FINDINGS: Fiduciary markers and bilateral upper lobe parenchymal opacities appear similar to prior CT given differences in technique. Left upper lobe nodule is also unchanged from prior CT. There is no new focal lung infiltrate, pleural effusion or pneumothorax identified. There are multiple healed left-sided rib fractures which appear new  from prior. Cardiac silhouette is enlarged, unchanged. Surgical clips are seen in the anterior left chest. IMPRESSION: 1. No acute cardiopulmonary process. 2. Fiduciary markers and bilateral upper lobe parenchymal opacities appear similar to prior CT given differences in technique. 3. Multiple healed left-sided rib fractures which appear new from prior. Electronically Signed   By: Darliss Cheney M.D.   On: 07/11/2023 03:25    Cardiac Studies     Patient Profile     77 y.o. 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 High-sensitivity troponin 2,449 >> 2,615. EKG shows sinus tachycardia with slight ST depression in inferolateral leads and non-specific T wave changes. Chest CTA negative for PE but did show an enlarging solid nodule in the superior segment of the left lower lobe, suspicious for metastatic disease. She denies any chest pain.  - Will order Echo. - Continue IV Heparin. She has a history of ICH. Neurology okay with IV Heparin but recommended against using a bolus. - Continue aspirin and statin. - Will start Lopressor 25mg  twice daily (only on once daily at home).  - Given frailty, memory issues, prior ICH, and enlarging lung nodule concerning for metastatic disease and the fact that she is chest pain free, will not rush her to the cath lab. Will continue IV Heparin and get an Echo. We may just end up treating medically.    Acute on Chronic HFpEF Last Echo in 10/2021 showed LVEF of 55-60% with severe LVH and grade 1 diastolic dysfunction, normal RV, and no significant valvular disease. BNP elevated at 1,494 -Mildly hypervolemic on exam.  Will start IV Lasix 20 mg daily.   PAD Patient has a long history of PAD with renal artery stenosis (s/p bilateral renal artery stenting), carotid disease (s/p right CEA), and mild lower extremity arterial disease (treated medically) - Continue aspirin and statin. - Can continue to follow as an outpatient.    Hypertension BP markedly elevated on arrival with systolic BP as high as 189. Most recent  BP 129/69 - Will start Lopressor 25mg  twice daily as above. - She is on Cardura 8mg  daily at home but this has not been restarted. Can restart    Hyperlipidemia - Will increase home Crestor to 40mg  daily. - LFTs mildly elevated on admission (AST 89, ALT 78) so will need to monitor this closely. Will check fasting lipid panel and LFTs in the morning.   Hypokalemia Potassium 3.1 on admission.  3.2 this morning, will replete   Prolonged QTc QTc >> 511 ms on admission.  - Potassium 3.1. Repleted.  -Potassium remains low at 3.2, would replete for goal potassium greater than 4.  Maintain magnesium greater than 2 -QTc 519 this morning, continue to monitor   Otherwise, per primary team: - COPD with chronic hypoxic respiratory failure on 3-4 L of O2 at home - Lung cancer  - Hypothyroidism -  Type 2 diabetes mellitus - Chronic anemia  - Seizure disorder - History of ICH   For questions or updates, please contact [facility] Please consult [email or web address] for contact info under        Signed, Little Ishikawa, MD  07/12/2023, 9:56 AM

## 2023-07-12 NOTE — Progress Notes (Signed)
PHARMACY - ANTICOAGULATION CONSULT NOTE  Pharmacy Consult for IV heparin Indication: NSTEMI  No Known Allergies  Patient Measurements: Height: 5\' 4"  (162.6 cm) Weight: 39.9 kg (87 lb 14.4 oz) IBW/kg (Calculated) : 54.7 Heparin Dosing Weight: total body weight   Vital Signs: Temp: 98.6 F (37 C) (12/21 2137) Temp Source: Oral (12/21 2137) BP: 122/78 (12/21 2137) Pulse Rate: 80 (12/21 2137)  Labs: Recent Labs    07/11/23 0841 07/11/23 1133 07/11/23 2130 07/12/23 0742 07/12/23 1645 07/12/23 2219  HGB 11.0*  --   --  9.9*  --   --   HCT 33.7*  --   --  32.7*  --   --   PLT 262  --   --  238  --   --   APTT 28  --   --   --   --   --   LABPROT 13.9  --   --   --   --   --   INR 1.1  --   --   --   --   --   HEPARINUNFRC  --   --    < > 0.12* 0.25* 0.28*  CREATININE 0.83  --   --  0.61  --   --   TROPONINIHS 2,449* 2,615*  --  1,371*  --   --    < > = values in this interval not displayed.    Estimated Creatinine Clearance: 37.1 mL/min (by C-G formula based on SCr of 0.61 mg/dL).   Medical History: Past Medical History:  Diagnosis Date   Anemia    Aortic arch anomaly    arteria lusoria    Breast cancer (HCC) 09/22/2015   Malignant   Breast cancer of upper-outer quadrant of left female breast (HCC) 09/08/2015   Cataract, immature    CHF (congestive heart failure) (HCC)    Acute CHF-06/2018   COPD (chronic obstructive pulmonary disease) (HCC)    Encephalopathy acute 11/12/2021   Femur fracture, left (HCC) 04/11/2020   left femur fracture( greater trochanter   Heart murmur    states no known problems   History of cervical spine x-ray 11/13/2021   History of hyperthyroidism    History of radiation therapy    bilateral lungs - 10/27/18-11/02/18, Dr. Antony Blackbird   History of radiation therapy 11/28/2015   left breast 10/30/2015-11/28/2015   Dr Antony Blackbird   Hyperlipidemia    Hypertension    states under control with meds., has been on med. x "long time"    Hypokalemia    from last physical.    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 High sensitivity troponin 2449 > 2615. CTa chest negative for PE. Pharmacy consulted for IV heparin dosing for ACS/NSTEMI. CBC: Hgb 11, Plt 262. PT/INR 13.9/1.1, aPTT 28 seconds. Patient noted  to be on aspirin 81mg  daily PTA, but not on any anticoagulants. Case was discussed by ED Provider with Neurology, who recommended no heparin boluses, but ok for full heparin goal range - Cardiology and Hospitalist aware.    07/12/23 Heparin level = 0.28 units/mL, subtherapeutic on heparin infusion at 750 units/hr CBC: Hgb decreased to 9.9, Plt remains WNL RN confirmed no interruption in therapy and no line issues No bleeding complications reported by RN  Goal of Therapy:  Heparin level 0.3-0.7 units/ml Monitor platelets by anticoagulation protocol: Yes   Plan:  No heparin bolus per Neurology  Increase heparin infusion to 850 units/hr Heparin level 8 hours after rate increase Daily CBC, heparin level Monitor closely for s/sx of bleeding   Terrilee Files, PharmD Clinical Pharmacist 07/12/2023,11:32 PM

## 2023-07-12 NOTE — Plan of Care (Signed)

## 2023-07-12 NOTE — Progress Notes (Signed)
Mobility Specialist - Progress Note  (3L HFNC) Pre-mobility: 59 bpm HR, 98% SpO2 During mobility: 67 bpm HR, 95% SpO2 Post-mobility: 64 bpm HR, 96% SPO2   07/12/23 1100  Mobility  Activity Transferred from bed to chair  Level of Assistance Contact guard assist, steadying assist  Assistive Device None  Range of Motion/Exercises Active Assistive  Activity Response Tolerated fair  Mobility Referral Yes  Mobility visit 1 Mobility  Mobility Specialist Start Time (ACUTE ONLY) 1045  Mobility Specialist Stop Time (ACUTE ONLY) 1100  Mobility Specialist Time Calculation (min) (ACUTE ONLY) 15 min   Pt was found in bed and agreeable to transfer to recliner chair. Declined ambulation due to fatigue. At EOS was left on recliner chair with all needs met. Call bell in reach and chair alarm on.   Billey Chang Mobility Specialist

## 2023-07-12 NOTE — Progress Notes (Signed)
PROGRESS NOTE    Kendra Watts  EXB:[id number] DOB: 02-Jan-1946 DOA: 07/11/2023 PCP: Georgianne Fick, MD   Brief Narrative: 77 year old with past medical history significant for COPD on chronic oxygen, peripheral artery disease with renal artery stenosis s/p renal artery stenting 2011 and 2016, carotid stenosis, bilateral iliac disease as well as chronic heart failure with preserved ejection fraction, hypertension, hypothyroidism, treated lung cancer being admitted with non-STEMI.  Patient presents complaining of shortness of breath associated for the last 2 days.  She denies chest pain.  She uses 3 L of oxygen at home she has chronic cough.   Assessment & Plan:   Principal Problem:   NSTEMI (non-ST elevated myocardial infarction) (HCC)  1-non-STEMI:  -Patient presented with increased dyspnea, elevation of troponin, question of T wave flattening on EKG.  Troponin  2600 -Patient was started on heparin drip.  Cleared by neurology due to history of ICH 2023 no heparin bolus -Received aspirin -Urology consulted and following -Continue metoprolol Cardiology might up to treat with just medical tx.   Pulmonary nodule: -Enlarging now 1.6 cm superior segment of the left lower lobe. -Patient follows with Dr. Ortencia Kick with pulmonologist.  Patient had a CT chest June 2024 which showed irregular solid 0.9 cm superior segmental left lower lobe pulmonary nodule mildly increased in size since 3//2024 CT chest suspicious for malignancy, either metachronous primary bronchogenic malignancy versus pulmonary metastasis.  Tiny 0.5 cm solid posterior right lower lobe pulmonary nodule stable in size.  At that time DR Delton Coombes  discussed with patient and plan was to repeat another CT chest in 6 months she is due January 8. -will need follow up with pulmonologist   Hypertension: She was started on amlodipine and Lopressor. On Cardura at home: If blood pressure increases can read resume  COPD on chronic 3 L of  oxygen:  -Continue Dulera,  Incruse, as needed albuterol CT Angio: Chronic radiation changes in both upper lobes with possible developing nodular lesion at the right apex with central cavitation. Enlarging solid nodule in the superior segment of the left lower lobe, suspicious for metastatic disease.  Seizure disorder, with history of stroke:  -Continue Keppra  Acute metabolic encephalopathy; agitation.  Will proceed with CT head.  Ativan PRN>   Hyperlipidemia:  -Continue with Crestor  Hypokalemia: -Replenish  Hypothyroidism: -Resume Synthroid.   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 -crestor  Prolonged QT: -replete electrolytes.   PAD: -Extensive PAD history with renal artery stenosis status post bilateral renal artery stenting, carotid disease status post right CEA, mild lower extremity arterial disease treated medically -Continue aspirin and statins  Estimated body mass index is 15.09 kg/m as calculated from the following:   Height as of this encounter: 5\' 4"  (1.626 m).   Weight as of this encounter: 39.9 kg.   DVT prophylaxis: heparin  Code Status: full code Family Communication: no family at bedside Disposition Plan:  Status is: Inpatient Remains inpatient appropriate because: management of Non STEMI    Consultants:  Cardiology   Procedures:  none  Antimicrobials:    Subjective: She is alert, agitated trying to remove IV telemetry,  hit my hand    Objective: Vitals:   07/11/23 1834 07/11/23 1953 07/11/23 2346 07/12/23 0341  BP: 127/79 (!) 149/86 137/88 138/82  Pulse: 80 93 78 85  Resp: 16 20 16 15   Temp: 98.2 F (36.8 C) 98.4 F (36.9 C) 99.3 F (37.4 C) 98 F (36.7 C)  TempSrc: Oral Oral Oral Oral  SpO2: 100%  97% 99% 96%  Weight:      Height:        Intake/Output Summary (Last 24 hours) at 07/12/2023 0745 Last data filed at 07/12/2023 0700 Gross per 24 hour  Intake 398.47 ml  Output 600 ml  Net -201.53 ml   Filed  Weights   07/11/23 0121 07/11/23 1214  Weight: 36.3 kg 39.9 kg    Examination:  General exam: Appears calm and comfortable  Respiratory system: Clear to auscultation. Respiratory effort normal. Cardiovascular system: S1 & S2 heard, RRR. No JVD, murmurs, rubs, gallops or clicks. No pedal edema. Gastrointestinal system: Abdomen is nondistended, soft and nontender. No organomegaly or masses felt. Normal bowel sounds heard. Central nervous system: Alert and oriented. No focal neurological deficits. Extremities: Symmetric 5 x 5 power. Skin: No rashes, lesions or ulcers Psychiatry: Judgement and insight appear normal. Mood & affect appropriate.     Data Reviewed: I have personally reviewed following labs and imaging studies  CBC: Recent Labs  Lab 07/11/23 0841  WBC 10.0  NEUTROABS 7.9*  HGB 11.0*  HCT 33.7*  MCV 87.1  PLT 262   Basic Metabolic Panel: Recent Labs  Lab 07/11/23 0841  NA 135  K 3.1*  CL 90*  CO2 32  GLUCOSE 102*  BUN 24*  CREATININE 0.83  CALCIUM 9.0   GFR: Estimated Creatinine Clearance: 35.8 mL/min (by C-G formula based on SCr of 0.83 mg/dL). Liver Function Tests: Recent Labs  Lab 07/11/23 0841  AST 89*  ALT 78*  ALKPHOS 84  BILITOT 0.7  PROT 7.7  ALBUMIN 4.4   No results for input(s): "LIPASE", "AMYLASE" in the last 168 hours. No results for input(s): "AMMONIA" in the last 168 hours. Coagulation Profile: Recent Labs  Lab 07/11/23 0841  INR 1.1   Cardiac Enzymes: No results for input(s): "CKTOTAL", "CKMB", "CKMBINDEX", "TROPONINI" in the last 168 hours. BNP (last 3 results) No results for input(s): "PROBNP" in the last 8760 hours. HbA1C: No results for input(s): "HGBA1C" in the last 72 hours. CBG: No results for input(s): "GLUCAP" in the last 168 hours. Lipid Profile: No results for input(s): "CHOL", "HDL", "LDLCALC", "TRIG", "CHOLHDL", "LDLDIRECT" in the last 72 hours. Thyroid Function Tests: No results for input(s): "TSH",  "T4TOTAL", "FREET4", "T3FREE", "THYROIDAB" in the last 72 hours. Anemia Panel: No results for input(s): "VITAMINB12", "FOLATE", "FERRITIN", "TIBC", "IRON", "RETICCTPCT" in the last 72 hours. Sepsis Labs: No results for input(s): "PROCALCITON", "LATICACIDVEN" in the last 168 hours.  Recent Results (from the past 240 hours)  Resp panel by RT-PCR (RSV, Flu A&B, Covid) Anterior Nasal Swab     Status: None   Collection Time: 07/11/23  9:03 AM   Specimen: Anterior Nasal Swab  Result Value Ref Range Status   SARS Coronavirus 2 by RT PCR NEGATIVE NEGATIVE Final    Comment: (NOTE) SARS-CoV-2 target nucleic acids are NOT DETECTED.  The SARS-CoV-2 RNA is generally detectable in upper respiratory specimens during the acute phase of infection. The lowest concentration of SARS-CoV-2 viral copies this assay can detect is 138 copies/mL. A negative result does not preclude SARS-Cov-2 infection and should not be used as the sole basis for treatment or other patient management decisions. A negative result may occur with  improper specimen collection/handling, submission of specimen other than nasopharyngeal swab, presence of viral mutation(s) within the areas targeted by this assay, and inadequate number of viral copies(<138 copies/mL). A negative result must be combined with clinical observations, patient history, and epidemiological information. The expected result is  Negative.  Fact Sheet for Patients:  BloggerCourse.com  Fact Sheet for Healthcare Providers:  SeriousBroker.it  This test is no t yet approved or cleared by the [place] FDA and  has been authorized for detection and/or diagnosis of SARS-CoV-2 by FDA under an Emergency Use Authorization (EUA). This EUA will remain  in effect (meaning this test can be used) for the duration of the COVID-19 declaration under Section 564(b)(1) of the Act, 21 U.S.C.section 360bbb-3(b)(1), unless  the authorization is terminated  or revoked sooner.       Influenza A by PCR NEGATIVE NEGATIVE Final   Influenza B by PCR NEGATIVE NEGATIVE Final    Comment: (NOTE) The Xpert Xpress SARS-CoV-2/FLU/RSV plus assay is intended as an aid in the diagnosis of influenza from Nasopharyngeal swab specimens and should not be used as a sole basis for treatment. Nasal washings and aspirates are unacceptable for Xpert Xpress SARS-CoV-2/FLU/RSV testing.  Fact Sheet for Patients: BloggerCourse.com  Fact Sheet for Healthcare Providers: SeriousBroker.it  This test is not yet approved or cleared by the [place] FDA and has been authorized for detection and/or diagnosis of SARS-CoV-2 by FDA under an Emergency Use Authorization (EUA). This EUA will remain in effect (meaning this test can be used) for the duration of the COVID-19 declaration under Section 564(b)(1) of the Act, 21 U.S.C. section 360bbb-3(b)(1), unless the authorization is terminated or revoked.     Resp Syncytial Virus by PCR NEGATIVE NEGATIVE Final    Comment: (NOTE) Fact Sheet for Patients: BloggerCourse.com  Fact Sheet for Healthcare Providers: SeriousBroker.it  This test is not yet approved or cleared by the [place] FDA and has been authorized for detection and/or diagnosis of SARS-CoV-2 by FDA under an Emergency Use Authorization (EUA). This EUA will remain in effect (meaning this test can be used) for the duration of the COVID-19 declaration under Section 564(b)(1) of the Act, 21 U.S.C. section 360bbb-3(b)(1), unless the authorization is terminated or revoked.  Performed at [facility], [address], [place], [place] [address]          Radiology Studies: CT Angio Chest PE W/Cm &/Or Wo Cm Result Date: 07/11/2023 CLINICAL DATA:  Shortness of breath on home oxygen. Pulmonary embolism  suspected, high probability. EXAM: CT ANGIOGRAPHY CHEST WITH CONTRAST TECHNIQUE: Multidetector CT imaging of the chest was performed using the standard protocol during bolus administration of intravenous contrast. Multiplanar CT image reconstructions and MIPs were obtained to evaluate the vascular anatomy. RADIATION DOSE REDUCTION: This exam was performed according to the departmental dose-optimization program which includes automated exposure control, adjustment of the mA and/or kV according to patient size and/or use of iterative reconstruction technique. CONTRAST:  60mL OMNIPAQUE IOHEXOL 350 MG/ML SOLN COMPARISON:  Radiographs 07/11/2023 and 09/23/2022. Noncontrast chest CT 01/15/2023. Contrast enhanced chest CT 09/23/2022. FINDINGS: Cardiovascular: The pulmonary arteries are well opacified with contrast to the level of the segmental branches. There is no evidence of acute pulmonary embolism. Central enlargement of the pulmonary arteries consistent with pulmonary arterial hypertension. There is limited opacification of the aorta. No acute systemic arterial abnormalities are identified. Diffuse atherosclerosis of the aorta, great vessels and coronary arteries with an aberrant retroesophageal right subclavian artery. 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 The heart size is normal. There is no pericardial effusion. Mediastinum/Nodes: There are no enlarged mediastinal, hilar or axillary lymph nodes.Grossly stable small subcarinal lymph nodes. The thyroid gland, trachea and esophagus demonstrate no significant findings. Lungs/Pleura: No pleural effusion or pneumothorax.  Mild-to-moderate centrilobular and paraseptal emphysema with diffuse central airway thickening. Chronic radiation changes are again noted in both upper lobes adjacent to the fiducial markers. On the right, there is a possible developing nodular lesion at the apex with central cavitation, measuring approximately 1.8 x 1.7 cm  on image 13/12. The left upper lobe radiation changes have not significantly changed. However, the previously demonstrated solid nodule in the superior segment of the left lower lobe has enlarged, measuring 1.6 x 1.0 cm on image 50/12 (previously 0.9 x 0.8 cm. No other enlarging nodules are identified. Additional scattered small nodules are unchanged. Upper abdomen: No acute findings are seen in the visualized upper abdomen. Left renal cortical thinning and severe aortic atherosclerosis noted. Musculoskeletal/Chest wall: There is no chest wall mass or suspicious osseous finding. There are nonacute rib fractures bilaterally, including pathologic fractures of the right 2nd and 3rd ribs attributed to prior radiation therapy. Lower left sided rib fractures have occurred in the interval, but are partially healed. Chronic severe compression fracture at T3, grossly unchanged. Review of the MIP images confirms the above findings. IMPRESSION: 1. No evidence of acute pulmonary embolism or other acute vascular findings in the chest. 2. Chronic radiation changes in both upper lobes with possible developing nodular lesion at the right apex with central cavitation. 3. Enlarging solid nodule in the superior segment of the left lower lobe, suspicious for metastatic disease. 4. No adenopathy or other evidence of metastatic disease. Recommend short-term CT follow-. PET-CT could be considered. 5. Aortic Atherosclerosis (ICD10-I70.0) and Emphysema (ICD10-J43.9). Electronically Signed   By: Carey Bullocks M.D.   On: 07/11/2023 12:48   DG Chest 2 View Result Date: 07/11/2023 CLINICAL DATA:  Shortness of breath.  Lung cancer. EXAM: CHEST - 2 VIEW COMPARISON:  Chest x-ray 11/21/2021.  Chest CT 01/15/2023. FINDINGS: Fiduciary markers and bilateral upper lobe parenchymal opacities appear similar to prior CT given differences in technique. Left upper lobe nodule is also unchanged from prior CT. There is no new focal lung infiltrate,  pleural effusion or pneumothorax identified. There are multiple healed left-sided rib fractures which appear new from prior. Cardiac silhouette is enlarged, unchanged. Surgical clips are seen in the anterior left chest. IMPRESSION: 1. No acute cardiopulmonary process. 2. Fiduciary markers and bilateral upper lobe parenchymal opacities appear similar to prior CT given differences in technique. 3. Multiple healed left-sided rib fractures which appear new from prior. Electronically Signed   By: Darliss Cheney M.D.   On: 07/11/2023 03:25        Scheduled Meds:  aspirin EC  81 mg Oral Daily   levETIRAcetam  500 mg Oral BID   metoprolol tartrate  25 mg Oral BID   mometasone-formoterol  2 puff Inhalation BID   And   umeclidinium bromide  1 puff Inhalation Daily   pantoprazole  40 mg Oral Daily   rosuvastatin  40 mg Oral Daily   Continuous Infusions:  sodium chloride 1 mL/kg/hr (07/11/23 1519)   heparin 600 Units/hr (07/11/23 2255)     LOS: 1 day    Time spent: 35 minutes    Deja Pisarski A Ura Yingling, MD [facility]   If 7PM-7AM, please contact night-coverage [email or web address]  07/12/2023, 7:45 AM

## 2023-07-12 NOTE — Progress Notes (Signed)
Patient agitated ,confused, aggressive; pulling IVA, tele and nasal cannula. Charger Nurse call to room. MD informed and orders given, at bedside. De-escalations techniques tried but was futile. Restraints applied for safety. Patient sister was informed, not able to come to bedside at this time. IV ativan given.

## 2023-07-12 NOTE — Progress Notes (Signed)
  Echocardiogram 2D Echocardiogram has been performed.  Ocie Doyne RDCS 07/12/2023, 2:22 PM

## 2023-07-12 NOTE — Progress Notes (Addendum)
PHARMACY - ANTICOAGULATION CONSULT NOTE  Pharmacy Consult for IV heparin Indication: NSTEMI  No Known Allergies  Patient Measurements: Height: 5\' 4"  (162.6 cm) Weight: 39.9 kg (87 lb 14.4 oz) IBW/kg (Calculated) : 54.7 Heparin Dosing Weight: total body weight   Vital Signs: Temp: 98.8 F (37.1 C) (12/21 0846) Temp Source: Oral (12/21 0846) BP: 129/69 (12/21 0846) Pulse Rate: 78 (12/21 0846)  Labs: Recent Labs    07/11/23 0841 07/11/23 1133 07/11/23 2130 07/12/23 0742  HGB 11.0*  --   --  9.9*  HCT 33.7*  --   --  32.7*  PLT 262  --   --  238  APTT 28  --   --   --   LABPROT 13.9  --   --   --   INR 1.1  --   --   --   HEPARINUNFRC  --   --  <0.10* 0.12*  CREATININE 0.83  --   --  0.61  TROPONINIHS 2,449* 2,615*  --  1,371*    Estimated Creatinine Clearance: 37.1 mL/min (by C-G formula based on SCr of 0.61 mg/dL).   Medical History: Past Medical History:  Diagnosis Date   Anemia    Aortic arch anomaly    arteria lusoria    Breast cancer (HCC) 09/22/2015   Malignant   Breast cancer of upper-outer quadrant of left female breast (HCC) 09/08/2015   Cataract, immature    CHF (congestive heart failure) (HCC)    Acute CHF-06/2018   COPD (chronic obstructive pulmonary disease) (HCC)    Encephalopathy acute 11/12/2021   Femur fracture, left (HCC) 04/11/2020   left femur fracture( greater trochanter   Heart murmur    states no known problems   History of cervical spine x-ray 11/13/2021   History of hyperthyroidism    History of radiation therapy    bilateral lungs - 10/27/18-11/02/18, Dr. Antony Blackbird   History of radiation therapy 11/28/2015   left breast 10/30/2015-11/28/2015   Dr Antony Blackbird   Hyperlipidemia    Hypertension    states under control with meds., has been on med. x "long time"   Hypokalemia    from last physical.    Hypothyroidism    Nonfunctioning kidney    left   Personal history of radiation therapy    2017   Pulmonary nodules     Bilateral   Radiation 10/30/15-11/28/15   left breast 42.72 Gy, boosted to 10 Gy   Renal artery stenosis (HCC)    Tobacco abuse    Wears partial dentures    upper and lower     Assessment: 63 y/oM with PMH of PAD with renal artery stenosis, carotid stenosis, bilateral iliac disease, HFpEF, COPD, HTN, HLD, hypothyroidism, lung cancer,  intracerebral hemorrhage in 11/2021 felt to be a hemorrhagic transformation of stroke, and status epilepticus in the setting of intraparenchymal hemorrhage who presented to [facility] on 07/11/2023 with SOB. High sensitivity troponin 2449 > 2615. CTa chest negative for PE. Pharmacy consulted for IV heparin dosing for ACS/NSTEMI. CBC: Hgb 11, Plt 262. PT/INR 13.9/1.1, aPTT 28 seconds. Patient noted to be on aspirin 81mg  daily PTA, but not on any anticoagulants. Case was discussed by ED Provider with Neurology, who recommended no heparin boluses, but ok for full heparin goal range - Cardiology and Hospitalist aware.    07/12/23 Heparin level = 0.12 units/mL, remains subtherapeutic on heparin infusion at 600 units/hr CBC: Hgb decreased to 9.9, Plt remains WNL RN confirmed no interruption in therapy  and no line issues No bleeding complications reported by RN  Goal of Therapy:  Heparin level 0.3-0.7 units/ml Monitor platelets by anticoagulation protocol: Yes   Plan:  No heparin bolus per Neurology  Increase heparin infusion to 750 units/hr Heparin level 8 hours after rate increase Daily CBC, heparin level Monitor closely for s/sx of bleeding   Greer Pickerel, PharmD, BCPS Clinical Pharmacist 07/12/2023,8:51 AM    Addendum:  1645 heparin level = 0.25 units/mL, subtherapeutic but trending up Per RN, loss of IV access from 1400-1430. Heparin resumed ~ 1430. No bleeding reported by RN   Plan: Continue heparin infusion at present rate of 750 units/hr Recheck heparin level 8 hours after heparin resumed (2230 tonight) Daily CBC, heparin level Monitor closely for  s/sx of bleeding   Greer Pickerel, PharmD, BCPS Clinical Pharmacist 07/12/2023 5:39 PM

## 2023-07-13 DIAGNOSIS — I214 Non-ST elevation (NSTEMI) myocardial infarction: Secondary | ICD-10-CM | POA: Diagnosis not present

## 2023-07-13 DIAGNOSIS — I5033 Acute on chronic diastolic (congestive) heart failure: Secondary | ICD-10-CM | POA: Diagnosis not present

## 2023-07-13 LAB — CBC
HCT: 32 % — ABNORMAL LOW (ref 36.0–46.0)
Hemoglobin: 9.7 g/dL — ABNORMAL LOW (ref 12.0–15.0)
MCH: 28 pg (ref 26.0–34.0)
MCHC: 30.3 g/dL (ref 30.0–36.0)
MCV: 92.5 fL (ref 80.0–100.0)
Platelets: 222 10*3/uL (ref 150–400)
RBC: 3.46 MIL/uL — ABNORMAL LOW (ref 3.87–5.11)
RDW: 16.7 % — ABNORMAL HIGH (ref 11.5–15.5)
WBC: 8.3 10*3/uL (ref 4.0–10.5)
nRBC: 0 % (ref 0.0–0.2)

## 2023-07-13 LAB — BASIC METABOLIC PANEL
Anion gap: 9 (ref 5–15)
BUN: 13 mg/dL (ref 8–23)
CO2: 27 mmol/L (ref 22–32)
Calcium: 8 mg/dL — ABNORMAL LOW (ref 8.9–10.3)
Chloride: 98 mmol/L (ref 98–111)
Creatinine, Ser: 0.64 mg/dL (ref 0.44–1.00)
GFR, Estimated: >60 mL/min (ref >60)
Glucose, Bld: 96 mg/dL (ref 70–99)
Potassium: 3.8 mmol/L (ref 3.5–5.1)
Sodium: 134 mmol/L — ABNORMAL LOW (ref 135–145)

## 2023-07-13 LAB — HEPARIN LEVEL (UNFRACTIONATED): Heparin Unfractionated: 0.46 [IU]/mL (ref 0.30–0.70)

## 2023-07-13 LAB — MAGNESIUM: Magnesium: 1.8 mg/dL (ref 1.7–2.4)

## 2023-07-13 MED ORDER — MAGNESIUM OXIDE -MG SUPPLEMENT 400 (240 MG) MG PO TABS
400.0000 mg | ORAL_TABLET | Freq: Once | ORAL | Status: AC
  Administered 2023-07-13: 400 mg via ORAL
  Filled 2023-07-13: qty 1

## 2023-07-13 MED ORDER — ROSUVASTATIN CALCIUM 10 MG PO TABS
10.0000 mg | ORAL_TABLET | Freq: Every day | ORAL | Status: DC
  Administered 2023-07-14 – 2023-07-18 (×5): 10 mg via ORAL
  Filled 2023-07-13 (×5): qty 1

## 2023-07-13 MED ORDER — ENOXAPARIN SODIUM 30 MG/0.3ML IJ SOSY
30.0000 mg | PREFILLED_SYRINGE | INTRAMUSCULAR | Status: DC
  Administered 2023-07-13 – 2023-07-17 (×5): 30 mg via SUBCUTANEOUS
  Filled 2023-07-13 (×5): qty 0.3

## 2023-07-13 MED ORDER — POTASSIUM CHLORIDE CRYS ER 20 MEQ PO TBCR
40.0000 meq | EXTENDED_RELEASE_TABLET | Freq: Once | ORAL | Status: AC
  Administered 2023-07-13: 40 meq via ORAL
  Filled 2023-07-13: qty 2

## 2023-07-13 NOTE — Plan of Care (Signed)
  Problem: Clinical Measurements: Goal: Ability to maintain clinical measurements within normal limits will improve Outcome: Progressing   Problem: Safety: Goal: Ability to remain free from injury will improve Outcome: Progressing   

## 2023-07-13 NOTE — Progress Notes (Signed)
PHARMACY - ANTICOAGULATION CONSULT NOTE  Pharmacy Consult for IV heparin Indication: NSTEMI  No Known Allergies  Patient Measurements: Height: 5\' 4"  (162.6 cm) Weight: 39.9 kg (87 lb 14.4 oz) IBW/kg (Calculated) : 54.7 Heparin Dosing Weight: total body weight   Vital Signs: Temp: 98.3 F (36.8 C) (12/22 0536) Temp Source: Axillary (12/22 0536) BP: 121/77 (12/22 0536) Pulse Rate: 79 (12/22 0536)  Labs: Recent Labs    07/11/23 0841 07/11/23 1133 07/11/23 2130 07/12/23 0742 07/12/23 1645 07/12/23 2219 07/13/23 0745  HGB 11.0*  --   --  9.9*  --   --  9.7*  HCT 33.7*  --   --  32.7*  --   --  32.0*  PLT 262  --   --  238  --   --  222  APTT 28  --   --   --   --   --   --   LABPROT 13.9  --   --   --   --   --   --   INR 1.1  --   --   --   --   --   --   HEPARINUNFRC  --   --    < > 0.12* 0.25* 0.28* 0.46  CREATININE 0.83  --   --  0.61  --   --  0.64  TROPONINIHS 2,449* 2,615*  --  1,371*  --   --   --    < > = values in this interval not displayed.    Estimated Creatinine Clearance: 37.1 mL/min (by C-G formula based on SCr of 0.64 mg/dL).   Medical History: Past Medical History:  Diagnosis Date   Anemia    Aortic arch anomaly    arteria lusoria    Breast cancer (HCC) 09/22/2015   Malignant   Breast cancer of upper-outer quadrant of left female breast (HCC) 09/08/2015   Cataract, immature    CHF (congestive heart failure) (HCC)    Acute CHF-06/2018   COPD (chronic obstructive pulmonary disease) (HCC)    Encephalopathy acute 11/12/2021   Femur fracture, left (HCC) 04/11/2020   left femur fracture( greater trochanter   Heart murmur    states no known problems   History of cervical spine x-ray 11/13/2021   History of hyperthyroidism    History of radiation therapy    bilateral lungs - 10/27/18-11/02/18, Dr. Antony Blackbird   History of radiation therapy 11/28/2015   left breast 10/30/2015-11/28/2015   Dr Antony Blackbird   Hyperlipidemia    Hypertension    states  under control with meds., has been on med. x "long time"   Hypokalemia    from last physical.    Hypothyroidism    Nonfunctioning kidney    left   Personal history of radiation therapy    2017   Pulmonary nodules    Bilateral   Radiation 10/30/15-11/28/15   left breast 42.72 Gy, boosted to 10 Gy   Renal artery stenosis (HCC)    Tobacco abuse    Wears partial dentures    upper and lower     Assessment: 49 y/oM with PMH of PAD with renal artery stenosis, carotid stenosis, bilateral iliac disease, HFpEF, COPD, HTN, HLD, hypothyroidism, lung cancer,  intracerebral hemorrhage in 11/2021 felt to be a hemorrhagic transformation of stroke, and status epilepticus in the setting of intraparenchymal hemorrhage who presented to [facility] on 07[facility]11/2023 with SOB. High sensitivity troponin 2449 > 2615. CTa chest negative for PE. Pharmacy  consulted for IV heparin dosing for ACS/NSTEMI. CBC: Hgb 11, Plt 262. PT/INR 13.9/1.1, aPTT 28 seconds. Patient noted to be on aspirin 81mg  daily PTA, but not on any anticoagulants. Case was discussed by ED Provider with Neurology, who recommended no heparin boluses, but ok for full heparin goal range - Cardiology and Hospitalist aware.    07/13/23 Heparin level = 0.46 units/mL, now therapeutic on heparin infusion at 850 units/hr CBC: Hgb decreased to 9.7, Plt remains WNL RN confirmed no interruption in therapy and no line issues No bleeding complications reported by RN CT head negative for hemorrhage. No acute intracranial abnormality.   Goal of Therapy:  Heparin level 0.3-0.7 units/ml Monitor platelets by anticoagulation protocol: Yes   Plan:  Continue heparin infusion at 850 units/hr Heparin level in 8 hours to confirm remains within therapeutic range Daily CBC, heparin level Monitor closely for s/sx of bleeding   Greer Pickerel, PharmD, BCPS Clinical Pharmacist 07/13/2023,8:40 AM

## 2023-07-13 NOTE — Progress Notes (Signed)
Rounding Note    Patient Name: Kendra Watts Date of Encounter: 07/13/2023  [facility] Cardiologist: Nanetta Batty, MD   Subjective   BP 103/66.  SpO2 97% on 3 L.  I/os not recorded.  Creatinine stable at 0.6, potassium 3.8.  She denies chest pain or dyspnea  Inpatient Medications    Scheduled Meds:  aspirin EC  81 mg Oral Daily   furosemide  20 mg Intravenous Daily   levETIRAcetam  500 mg Oral BID   levothyroxine  88 mcg Oral QAC breakfast   LORazepam  1 mg Intramuscular Once   metoprolol tartrate  25 mg Oral BID   mometasone-formoterol  2 puff Inhalation BID   And   umeclidinium bromide  1 puff Inhalation Daily   pantoprazole  40 mg Oral Daily   rosuvastatin  40 mg Oral Daily   Continuous Infusions:  heparin 850 Units/hr (07/12/23 2337)   PRN Meds: acetaminophen **OR** acetaminophen, albuterol, LORazepam, morphine injection   Vital Signs    Vitals:   07/12/23 2137 07/13/23 0131 07/13/23 0536 07/13/23 1045  BP: 122/78 103/67 121/77 103/66  Pulse: 80 64 79 84  Resp: (!) 24 (!) 27 (!) 25   Temp: 98.6 F (37 C) 99.1 F (37.3 C) 98.3 F (36.8 C) 97.9 F (36.6 C)  TempSrc: Oral Axillary Axillary Axillary  SpO2: 100% 100% 98% 97%  Weight:      Height:        Intake/Output Summary (Last 24 hours) at 07/13/2023 1054 Last data filed at 07/13/2023 1037 Gross per 24 hour  Intake 1078 ml  Output 650 ml  Net 428 ml      07/11/2023   12:14 PM 07/11/2023    1:21 AM 05/20/2023    2:28 PM  Last 3 Weights  Weight (lbs) 87 lb 14.4 oz 80 lb 81 lb  Weight (kg) 39.871 kg 36.288 kg 36.741 kg      Telemetry    Normal sinus rhythm- Personally Reviewed  ECG    Normal sinus rhythm, nonspecific T wave flattening, QTc 519- Personally Reviewed  Physical Exam   GEN: No acute distress.   Neck: + JVD Cardiac: RRR, no murmurs, rubs, or gallops.  Respiratory: Diminished breath sounds GI: Soft, nontender, non-distended  MS: No edema; No  deformity. Neuro:  Nonfocal  Psych: Normal affect   Labs    High Sensitivity Troponin:   Recent Labs  Lab 07/11/23 0841 07/11/23 1133 07/12/23 0742  TROPONINIHS 2,449* 2,615* 1,371*     Chemistry Recent Labs  Lab 07/11/23 0841 07/12/23 0742 07/13/23 0745  NA 135 140 134*  K 3.1* 3.2* 3.8  CL 90* 99 98  CO2 32 31 27  GLUCOSE 102* 86 96  BUN 24* 14 13  CREATININE 0.83 0.61 0.64  CALCIUM 9.0 8.2* 8.0*  MG  --   --  1.8  PROT 7.7 6.1*  --   ALBUMIN 4.4 3.5  --   AST 89* 62*  --   ALT 78* 63*  --   ALKPHOS 84 68  --   BILITOT 0.7 0.8  --   GFRNONAA >60 >60 >60  ANIONGAP 13 10 9     Lipids  Recent Labs  Lab 07/12/23 1441  CHOL 133  TRIG 52  HDL 78  LDLCALC 45  CHOLHDL 1.7    Hematology Recent Labs  Lab 07/11/23 0841 07/12/23 0742 07/13/23 0745  WBC 10.0 9.1 8.3  RBC 3.87 3.56* 3.46*  HGB 11.0* 9.9* 9.7*  HCT 33.7* 32.7* 32.0*  MCV 87.1 91.9 92.5  MCH 28.4 27.8 28.0  MCHC 32.6 30.3 30.3  RDW 16.4* 16.4* 16.7*  PLT 262 238 222   Thyroid No results for input(s): "TSH", "FREET4" in the last 168 hours.  BNP Recent Labs  Lab 07/11/23 0841  BNP 1,494.1*    DDimer No results for input(s): "DDIMER" in the last 168 hours.   Radiology    CT HEAD WO CONTRAST ( ) Result Date: 07/12/2023 CLINICAL DATA:  Altered mental status EXAM: CT HEAD WITHOUT CONTRAST TECHNIQUE: Contiguous axial images were obtained from the base of the skull through the vertex without intravenous contrast. RADIATION DOSE REDUCTION: This exam was performed according to the departmental dose-optimization program which includes automated exposure control, adjustment of the mA and/or kV according to patient size and/or use of iterative reconstruction technique. COMPARISON:  11/12/2021 FINDINGS: Brain: There is no mass, hemorrhage or extra-axial collection. The size and configuration of the ventricles and extra-axial CSF spaces are normal. There is hypoattenuation of the white matter, most  commonly indicating chronic small vessel disease. Vascular: Atherosclerotic calcification of the internal carotid arteries at the skull base. No abnormal hyperdensity of the major intracranial arteries or dural venous sinuses. Skull: The visualized skull base, calvarium and extracranial soft tissues are normal. Sinuses/Orbits: No fluid levels or advanced mucosal thickening of the visualized paranasal sinuses. No mastoid or middle ear effusion. Normal orbits. Other: None. IMPRESSION: 1. No acute intracranial abnormality. 2. Chronic small vessel disease. Electronically Signed   By: Deatra Robinson M.D.   On: 07/12/2023 21:16   ECHOCARDIOGRAM COMPLETE Result Date: 07/12/2023    ECHOCARDIOGRAM REPORT   Patient Name:   [name] [facility] Date of Exam: 07/12/2023 Medical Rec #:  [id number]       Height:       64.0 in Accession #:    [id number]      Weight:       87.9 lb Date of Birth:  [birth year]       BSA:          1.379 m Patient Age:    77 years        BP:           107/63 mmHg Patient Gender: F               HR:           65 bpm. Exam Location:  Inpatient Procedure: 2D Echo, Cardiac Doppler and Color Doppler Indications:    NSTEMI  History:        Patient has prior history of Echocardiogram examinations, most                 recent 11/13/2021. COPD; Risk Factors:Hypertension and Current                 Smoker.  Sonographer:    Vern Claude Referring Phys: [id number] CALLIE E GOODRICH IMPRESSIONS  1. Left ventricular ejection fraction, by estimation, is 55 to 60%. The left ventricle has normal function. The left ventricle has no regional wall motion abnormalities. Left ventricular diastolic parameters are indeterminate. Elevated left atrial pressure.  2. Right ventricular systolic function is normal. The right ventricular size is normal. Tricuspid regurgitation signal is inadequate for assessing PA pressure.  3. The mitral valve is normal in structure. No evidence of mitral valve regurgitation. No evidence of mitral  stenosis.  4. The aortic valve is normal in structure. Aortic valve regurgitation is trivial. No aortic  stenosis is present.  5. The inferior vena cava is dilated in size with <50% respiratory variability, suggesting right atrial pressure of 15 mmHg. Comparison(s): No significant change from prior study. Prior images reviewed side by side. FINDINGS  Left Ventricle: Left ventricular ejection fraction, by estimation, is 55 to 60%. The left ventricle has normal function. The left ventricle has no regional wall motion abnormalities. The left ventricular internal cavity size was normal in size. Suboptimal image quality limits for assessment of left ventricular hypertrophy. Left ventricular diastolic parameters are indeterminate. Elevated left atrial pressure. Right Ventricle: The right ventricular size is normal. No increase in right ventricular wall thickness. Right ventricular systolic function is normal. Tricuspid regurgitation signal is inadequate for assessing PA pressure. Left Atrium: Left atrial size was normal in size. Right Atrium: Right atrial size was normal in size. Pericardium: There is no evidence of pericardial effusion. Mitral Valve: The mitral valve is normal in structure. No evidence of mitral valve regurgitation. No evidence of mitral valve stenosis. MV peak gradient, 4.5 mmHg. The mean mitral valve gradient is 1.0 mmHg. Tricuspid Valve: The tricuspid valve is normal in structure. Tricuspid valve regurgitation is not demonstrated. No evidence of tricuspid stenosis. Aortic Valve: The aortic valve is normal in structure. Aortic valve regurgitation is trivial. Aortic regurgitation PHT measures 493 msec. No aortic stenosis is present. Aortic valve mean gradient measures 2.0 mmHg. Aortic valve peak gradient measures 3.8  mmHg. Aortic valve area, by VTI measures 2.00 cm. Pulmonic Valve: The pulmonic valve was normal in structure. Pulmonic valve regurgitation is trivial. No evidence of pulmonic stenosis.  Aorta: The aortic root and ascending aorta are structurally normal, with no evidence of dilitation. Venous: The inferior vena cava is dilated in size with less than 50% respiratory variability, suggesting right atrial pressure of 15 mmHg. IAS/Shunts: The atrial septum is grossly normal.  LEFT VENTRICLE PLAX 2D LVIDd:         4.35 cm      Diastology LVIDs:         3.20 cm      LV e' medial:    4.03 cm/s LV PW:         0.95 cm      LV E/e' medial:  13.7 LV IVS:        0.95 cm      LV e' lateral:   2.50 cm/s LVOT diam:     1.70 cm      LV E/e' lateral: 22.1 LV SV:         42 LV SV Index:   30 LVOT Area:     2.27 cm  LV Volumes (MOD) LV vol d, MOD A2C: 106.0 ml LV vol d, MOD A4C: 87.2 ml LV vol s, MOD A2C: 38.8 ml LV vol s, MOD A4C: 44.7 ml LV SV MOD A2C:     67.2 ml LV SV MOD A4C:     87.2 ml LV SV MOD BP:      53.9 ml RIGHT VENTRICLE             IVC RV Basal diam:  3.80 cm     IVC diam: 2.20 cm RV Mid diam:    2.60 cm RV S prime:     10.90 cm/s TAPSE (M-mode): 1.6 cm LEFT ATRIUM             Index        RIGHT ATRIUM           Index LA  diam:        2.40 cm 1.74 cm/m   RA Area:     11.60 cm LA Vol (A2C):   38.5 ml 27.91 ml/m  RA Volume:   26.40 ml  19.14 ml/m LA Vol (A4C):   19.9 ml 14.43 ml/m LA Biplane Vol: 28.2 ml 20.44 ml/m  AORTIC VALVE                    PULMONIC VALVE AV Area (Vmax):    1.82 cm     PV Vmax:          0.78 m/s AV Area (Vmean):   1.46 cm     PV Peak grad:     2.4 mmHg AV Area (VTI):     2.00 cm     PR End Diast Vel: 3.99 msec AV Vmax:           97.40 cm/s AV Vmean:          69.600 cm/s AV VTI:            0.210 m AV Peak Grad:      3.8 mmHg AV Mean Grad:      2.0 mmHg LVOT Vmax:         78.30 cm/s LVOT Vmean:        44.700 cm/s LVOT VTI:          0.185 m LVOT/AV VTI ratio: 0.88 AI PHT:            493 msec  AORTA Ao Root diam: 2.90 cm Ao Asc diam:  2.60 cm MITRAL VALVE MV Area (PHT): 2.95 cm    SHUNTS MV Area VTI:   1.85 cm    Systemic VTI:  0.18 m MV Peak grad:  4.5 mmHg    Systemic Diam:  1.70 cm MV Mean grad:  1.0 mmHg MV Vmax:       1.06 m/s MV Vmean:      45.2 cm/s MV Decel Time: 257 msec MV E velocity: 55.20 cm/s MV A velocity: 88.50 cm/s MV E/A ratio:  0.62 Riley Lam MD Electronically signed by Riley Lam MD Signature Date/Time: 07/12/2023/3:37:41 PM    Final    CT Angio Chest PE W/Cm &/Or Wo Cm Result Date: 07/11/2023 CLINICAL DATA:  Shortness of breath on home oxygen. Pulmonary embolism suspected, high probability. EXAM: CT ANGIOGRAPHY CHEST WITH CONTRAST TECHNIQUE: Multidetector CT imaging of the chest was performed using the standard protocol during bolus administration of intravenous contrast. Multiplanar CT image reconstructions and MIPs were obtained to evaluate the vascular anatomy. RADIATION DOSE REDUCTION: This exam was performed according to the departmental dose-optimization program which includes automated exposure control, adjustment of the mA and/or kV according to patient size and/or use of iterative reconstruction technique. CONTRAST:  60mL OMNIPAQUE IOHEXOL 350 MG/ML SOLN COMPARISON:  Radiographs 07/11/2023 and 09/23/2022. Noncontrast chest CT 01/15/2023. Contrast enhanced chest CT 09/23/2022. FINDINGS: Cardiovascular: The pulmonary arteries are well opacified with contrast to the level of the segmental branches. There is no evidence of acute pulmonary embolism. Central enlargement of the pulmonary arteries consistent with pulmonary arterial hypertension. There is limited opacification of the aorta. No acute systemic arterial abnormalities are identified. Diffuse atherosclerosis of the aorta, great vessels and coronary arteries with an aberrant retroesophageal right subclavian artery. 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 The heart size is normal. There is no pericardial effusion. Mediastinum/Nodes: There are no enlarged mediastinal, hilar or axillary lymph nodes.Grossly stable small subcarinal  lymph nodes. The thyroid gland,  trachea and esophagus demonstrate no significant findings. Lungs/Pleura: No pleural effusion or pneumothorax. Mild-to-moderate centrilobular and paraseptal emphysema with diffuse central airway thickening. Chronic radiation changes are again noted in both upper lobes adjacent to the fiducial markers. On the right, there is a possible developing nodular lesion at the apex with central cavitation, measuring approximately 1.8 x 1.7 cm on image 13/12. The left upper lobe radiation changes have not significantly changed. However, the previously demonstrated solid nodule in the superior segment of the left lower lobe has enlarged, measuring 1.6 x 1.0 cm on image 50/12 (previously 0.9 x 0.8 cm. No other enlarging nodules are identified. Additional scattered small nodules are unchanged. Upper abdomen: No acute findings are seen in the visualized upper abdomen. Left renal cortical thinning and severe aortic atherosclerosis noted. Musculoskeletal/Chest wall: There is no chest wall mass or suspicious osseous finding. There are nonacute rib fractures bilaterally, including pathologic fractures of the right 2nd and 3rd ribs attributed to prior radiation therapy. Lower left sided rib fractures have occurred in the interval, but are partially healed. Chronic severe compression fracture at T3, grossly unchanged. Review of the MIP images confirms the above findings. IMPRESSION: 1. No evidence of acute pulmonary embolism or other acute vascular findings in the chest. 2. Chronic radiation changes in both upper lobes with possible developing nodular lesion at the right apex with central cavitation. 3. Enlarging solid nodule in the superior segment of the left lower lobe, suspicious for metastatic disease. 4. No adenopathy or other evidence of metastatic disease. Recommend short-term CT follow-. PET-CT could be considered. 5. Aortic Atherosclerosis (ICD10-I70.0) and Emphysema (ICD10-J43.9). Electronically Signed   By: Carey Bullocks  M.D.   On: 07/11/2023 12:48    Cardiac Studies   Echo 07/12/23: EF 55 to 60%, normal RV function, no significant valvular disease, RAP 15  Patient Profile     77 y.o. 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 High-sensitivity troponin 2,449 >> 2,615. EKG shows sinus tachycardia with slight ST depression in inferolateral leads and non-specific T wave changes. Chest CTA negative for PE but did show an enlarging solid nodule in the superior segment of the left lower lobe, suspicious for metastatic disease. She denies any chest pain.  -Started on IV Heparin. She has a history of ICH. Neurology okay with IV Heparin but recommended against using a bolus. - Continue aspirin and statin. - Started Lopressor 25 mg BID - Given frailty, memory issues, prior ICH, and enlarging lung nodule concerning for metastatic disease and the fact that she is chest pain free and normal LV systolic function on echo, recommend medical management.  She will complete 48 hours on heparin drip today, can discontinue heparin drip this afternoon   Acute on Chronic HFpEF Last Echo in 10/2021 showed LVEF of 55-60% with severe LVH and grade 1 diastolic dysfunction,  normal RV, and no significant valvular disease. BNP elevated at 1,494 -Mildly hypervolemic on  exam.  Continue IV Lasix   PAD Patient has a long history of PAD with renal artery stenosis (s/p bilateral renal artery stenting), carotid disease (s/p right CEA), and mild lower extremity arterial disease (treated medically) - Continue aspirin and statin. - Can continue to follow as an outpatient.    Hypertension - Will start Lopressor 25mg  twice daily as above. - She is on Cardura 8mg  daily at home but this has not been restarted. Can restart if needed.  Has been normotensive   Hyperlipidemia -Continue Crestor 10 mg daily, LDL 45 - LFTs mildly elevated on admission (AST 89, ALT 78) so will need to monitor this closely.   Hypokalemia Potassium 3.1 on admission.  Repleted   Prolonged QTc QTc >> 511 ms on admission.  -Replete potassium for goal greater than 4, magnesium for goal greater than 2.  Monitor QTc   Otherwise, per primary team: - COPD with chronic hypoxic respiratory failure on 3-4 L of O2 at home - Lung cancer  - Hypothyroidism - Type 2 diabetes mellitus - Chronic anemia  - Seizure disorder - History of ICH   For questions or updates, please contact [facility] Please consult [email or web address] for contact info under        Signed, Little Ishikawa, MD  07/13/2023, 10:54 AM

## 2023-07-13 NOTE — Progress Notes (Addendum)
PROGRESS NOTE    Kendra Watts  BMW:[id number] DOB: Mar 27, 1946 DOA: 07/11/2023 PCP: Georgianne Fick, MD   Brief Narrative:  77 year old female with past medical history significant for COPD on chronic oxygen, peripheral artery disease with renal artery stenosis s/p renal artery stenting 2011 and 2016, carotid stenosis, bilateral iliac disease as well as chronic heart failure with preserved ejection fraction, hypertension, hypothyroidism, treated lung cancer being admitted with non-STEMI.  Patient presents complaining of shortness of breath associated for the last 2 days.  She denies chest pain.  She uses 3 L of oxygen at home she has chronic cough.  Assessment & Plan:   Principal Problem:   NSTEMI (non-ST elevated myocardial infarction) (HCC) Active Problems:   Acute on chronic diastolic heart failure (HCC)  Non-STEMI:  Troponin was at 2600 and patient presented with dyspnea with some ST depression and nonspecific T wave changes.  Seen by cardiology and was started on heparin drip.  Continue metoprolol.  Heparin drip will be continued until today completed 48 hours.  No chest pain or shortness of breath at the time of my evaluation. Plan for medical treatment at this time.  Pulmonary nodule:  Enlarging now 1.6 cm superior segment of the left lower lobe.  Follows up with Dr. Delton Coombes, pulmonary as outpatient.   Plan was to repeat another CT chest in 6 months she is due January 8.  Will need to follow-up for pulmonary nodule as outpatient.  Acute on Chronic HFpEF  2D echocardiogram from 4/23 with LV ejection fraction of 55 to 60% with diastolic dysfunction.  BNP was elevated.  Mildly hypervolemic so Lasix has been continued.  Currently on IV Lasix 20 mg daily.  Monitor BMP.  Cardiology following.  Essential Hypertension:  on amlodipine and Lopressor. On Cardura at home currently on hold.  Blood pressure seems to be stable.  COPD on chronic 3 L of oxygen:  -Continue Dulera,  Incruse, as  needed albuterol.  Currently on 3L of oxygen.  Seizure disorder, with history of stroke:  -Continue Keppra  Acute metabolic encephalopathy; agitation.  Improved at this time still has sitter at bedside but more more Communicative.  Appears calm this morning.  CT head without any acute findings.  Hyperlipidemia:  Continue with Crestor  Hypokalemia: Improved after repletion.  Potassium today at 3.8.  Hypothyroidism: Continue Synthroid.   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 -crestor  Prolonged QT: Potassium replenished.  PAD: Extensive PAD history with renal artery stenosis status post bilateral renal artery stenting, carotid disease status post right CEA, mild lower extremity arterial disease treated medically.  Patient was on aspirin and statins as outpatient.  Debility deconditioning.  Will get PT evaluation.  Lives in her apartment.  Went to [facility] in the past.  DVT prophylaxis: heparin drip, will transition to Lovenox after stopping heparin drip.  Code Status: full code  Family Communication:  Spoke with the patient's sister [name] on the phone and updated her about the clinical condition of the patient.  Disposition Plan: Home  Status is: Inpatient  Remains inpatient appropriate because: management of Non STEMI, heparin drip, IV Lasix  Consultants:  Cardiology   Procedures:  none  Antimicrobials:    Subjective:  Today, patient was seen and examined at bedside.  Nursing staff reported that she was agitated and required a sitter yesterday but appears to be calm this morning.  Hard of hearing but Communicative.  Denies any chest pain or shortness of breath.  Sitter at bedside.  Objective: Vitals:   07/13/23 0131 07/13/23 0536 07/13/23 1045 07/13/23 1537  BP: 103/67 121/77 103/66 101/61  Pulse: 64 79 84 75  Resp: (!) 27 (!) 25  20  Temp: 99.1 F (37.3 C) 98.3 F (36.8 C) 97.9 F (36.6 C) 97.6 F (36.4 C)  TempSrc:  Axillary Axillary Axillary   SpO2: 100% 98% 97% 98%  Weight:      Height:        Intake/Output Summary (Last 24 hours) at 07/13/2023 1642 Last data filed at 07/13/2023 1529 Gross per 24 hour  Intake 1078 ml  Output 1200 ml  Net -122 ml   Filed Weights   07/11/23 0121 07/11/23 1214  Weight: 36.3 kg 39.9 kg    Physical examination:  Body mass index is 15.09 kg/m.   General: Thinly built, not in obvious distress hard of hearing, on nasal cannula oxygen HENT:   No scleral pallor or icterus noted. Oral mucosa is moist.  Distended neck vein. Chest:  .  Diminished breath sounds bilaterally. No crackles or wheezes.  CVS: S1 &S2 heard. No murmur.  Regular rate and rhythm. Abdomen: Soft, nontender, nondistended.  Bowel sounds are heard.   Extremities: No cyanosis, clubbing  peripheral pulses are palpable. Psych: Alert, awake and Communicative. CNS:  No cranial nerve deficits generalized weakness. Skin: Warm and dry.  No rashes noted.  Data Reviewed: I have personally reviewed following labs and imaging studies  CBC: Recent Labs  Lab 07/11/23 0841 07/12/23 0742 07/13/23 0745  WBC 10.0 9.1 8.3  NEUTROABS 7.9*  --   --   HGB 11.0* 9.9* 9.7*  HCT 33.7* 32.7* 32.0*  MCV 87.1 91.9 92.5  PLT 262 238 222   Basic Metabolic Panel: Recent Labs  Lab 07/11/23 0841 07/12/23 0742 07/13/23 0745  NA 135 140 134*  K 3.1* 3.2* 3.8  CL 90* 99 98  CO2 32 31 27  GLUCOSE 102* 86 96  BUN 24* 14 13  CREATININE 0.83 0.61 0.64  CALCIUM 9.0 8.2* 8.0*  MG  --   --  1.8   GFR: Estimated Creatinine Clearance: 37.1 mL/min (by C-G formula based on SCr of 0.64 mg/dL). Liver Function Tests: Recent Labs  Lab 07/11/23 0841 07/12/23 0742  AST 89* 62*  ALT 78* 63*  ALKPHOS 84 68  BILITOT 0.7 0.8  PROT 7.7 6.1*  ALBUMIN 4.4 3.5   No results for input(s): "LIPASE", "AMYLASE" in the last 168 hours. No results for input(s): "AMMONIA" in the last 168 hours. Coagulation Profile: Recent Labs   Lab 07/11/23 0841  INR 1.1   Cardiac Enzymes: No results for input(s): "CKTOTAL", "CKMB", "CKMBINDEX", "TROPONINI" in the last 168 hours. BNP (last 3 results) No results for input(s): "PROBNP" in the last 8760 hours. HbA1C: No results for input(s): "HGBA1C" in the last 72 hours. CBG: No results for input(s): "GLUCAP" in the last 168 hours. Lipid Profile: Recent Labs    07/12/23 1441  CHOL 133  HDL 78  LDLCALC 45  TRIG 52  CHOLHDL 1.7   Thyroid Function Tests: No results for input(s): "TSH", "T4TOTAL", "FREET4", "T3FREE", "THYROIDAB" in the last 72 hours. Anemia Panel: No results for input(s): "VITAMINB12", "FOLATE", "FERRITIN", "TIBC", "IRON", "RETICCTPCT" in the last 72 hours. Sepsis Labs: No results for input(s): "PROCALCITON", "LATICACIDVEN" in the last 168 hours.  Recent Results (from the past 240 hours)  Resp panel by RT-PCR (RSV, Flu A&B, Covid) Anterior Nasal Swab     Status: None   Collection Time: 07/11/23  9:03 AM   Specimen: Anterior Nasal Swab  Result Value Ref Range Status   SARS Coronavirus 2 by RT PCR NEGATIVE NEGATIVE Final    Comment: (NOTE) SARS-CoV-2 target nucleic acids are NOT DETECTED.  The SARS-CoV-2 RNA is generally detectable in upper respiratory specimens during the acute phase of infection. The lowest concentration of SARS-CoV-2 viral copies this assay can detect is 138 copies/mL. A negative result does not preclude SARS-Cov-2 infection and should not be used as the sole basis for treatment or other patient management decisions. A negative result may occur with  improper specimen collection/handling, submission of specimen other than nasopharyngeal swab, presence of viral mutation(s) within the areas targeted by this assay, and inadequate number of viral copies(<138 copies/mL). A negative result must be combined with clinical observations, patient history, and epidemiological information. The expected result is Negative.  Fact Sheet for  Patients:  BloggerCourse.com  Fact Sheet for Healthcare Providers:  SeriousBroker.it  This test is no t yet approved or cleared by the [place] FDA and  has been authorized for detection and/or diagnosis of SARS-CoV-2 by FDA under an Emergency Use Authorization (EUA). This EUA will remain  in effect (meaning this test can be used) for the duration of the COVID-19 declaration under Section 564(b)(1) of the Act, 21 U.S.C.section 360bbb-3(b)(1), unless the authorization is terminated  or revoked sooner.       Influenza A by PCR NEGATIVE NEGATIVE Final   Influenza B by PCR NEGATIVE NEGATIVE Final    Comment: (NOTE) The Xpert Xpress SARS-CoV-2/FLU/RSV plus assay is intended as an aid in the diagnosis of influenza from Nasopharyngeal swab specimens and should not be used as a sole basis for treatment. Nasal washings and aspirates are unacceptable for Xpert Xpress SARS-CoV-2/FLU/RSV testing.  Fact Sheet for Patients: BloggerCourse.com  Fact Sheet for Healthcare Providers: SeriousBroker.it  This test is not yet approved or cleared by the [place] FDA and has been authorized for detection and/or diagnosis of SARS-CoV-2 by FDA under an Emergency Use Authorization (EUA). This EUA will remain in effect (meaning this test can be used) for the duration of the COVID-19 declaration under Section 564(b)(1) of the Act, 21 U.S.C. section 360bbb-3(b)(1), unless the authorization is terminated or revoked.     Resp Syncytial Virus by PCR NEGATIVE NEGATIVE Final    Comment: (NOTE) Fact Sheet for Patients: BloggerCourse.com  Fact Sheet for Healthcare Providers: SeriousBroker.it  This test is not yet approved or cleared by the [place] FDA and has been authorized for detection and/or diagnosis of SARS-CoV-2 by FDA under an Emergency  Use Authorization (EUA). This EUA will remain in effect (meaning this test can be used) for the duration of the COVID-19 declaration under Section 564(b)(1) of the Act, 21 U.S.C. section 360bbb-3(b)(1), unless the authorization is terminated or revoked.  Performed at [facility], [address] [place], [place] [address]      Radiology Studies: CT HEAD WO CONTRAST ( ) Result Date: 07/12/2023 CLINICAL DATA:  Altered mental status EXAM: CT HEAD WITHOUT CONTRAST TECHNIQUE: Contiguous axial images were obtained from the base of the skull through the vertex without intravenous contrast. RADIATION DOSE REDUCTION: This exam was performed according to the departmental dose-optimization program which includes automated exposure control, adjustment of the mA and/or kV according to patient size and/or use of iterative reconstruction technique. COMPARISON:  11/12/2021 FINDINGS: Brain: There is no mass, hemorrhage or extra-axial collection. The size and configuration of the ventricles and extra-axial CSF spaces are normal.  There is hypoattenuation of the white matter, most commonly indicating chronic small vessel disease. Vascular: Atherosclerotic calcification of the internal carotid arteries at the skull base. No abnormal hyperdensity of the major intracranial arteries or dural venous sinuses. Skull: The visualized skull base, calvarium and extracranial soft tissues are normal. Sinuses/Orbits: No fluid levels or advanced mucosal thickening of the visualized paranasal sinuses. No mastoid or middle ear effusion. Normal orbits. Other: None. IMPRESSION: 1. No acute intracranial abnormality. 2. Chronic small vessel disease. Electronically Signed   By: Deatra Robinson M.D.   On: 07/12/2023 21:16   ECHOCARDIOGRAM COMPLETE Result Date: 07/12/2023    ECHOCARDIOGRAM REPORT   Patient Name:   AMARISSA HUGHETT [facility] Date of Exam: 07/12/2023 Medical Rec #:  [id number]       Height:       64.0 in Accession #:     [id number]      Weight:       87.9 lb Date of Birth:  1946-04-13       BSA:          1.379 m Patient Age:    77 years        BP:           107/63 mmHg Patient Gender: F               HR:           65 bpm. Exam Location:  Inpatient Procedure: 2D Echo, Cardiac Doppler and Color Doppler Indications:    NSTEMI  History:        Patient has prior history of Echocardiogram examinations, most                 recent 11/13/2021. COPD; Risk Factors:Hypertension and Current                 Smoker.  Sonographer:    Vern Claude Referring Phys: [id number] CALLIE E GOODRICH IMPRESSIONS  1. Left ventricular ejection fraction, by estimation, is 55 to 60%. The left ventricle has normal function. The left ventricle has no regional wall motion abnormalities. Left ventricular diastolic parameters are indeterminate. Elevated left atrial pressure.  2. Right ventricular systolic function is normal. The right ventricular size is normal. Tricuspid regurgitation signal is inadequate for assessing PA pressure.  3. The mitral valve is normal in structure. No evidence of mitral valve regurgitation. No evidence of mitral stenosis.  4. The aortic valve is normal in structure. Aortic valve regurgitation is trivial. No aortic stenosis is present.  5. The inferior vena cava is dilated in size with <50% respiratory variability, suggesting right atrial pressure of 15 mmHg. Comparison(s): No significant change from prior study. Prior images reviewed side by side. FINDINGS  Left Ventricle: Left ventricular ejection fraction, by estimation, is 55 to 60%. The left ventricle has normal function. The left ventricle has no regional wall motion abnormalities. The left ventricular internal cavity size was normal in size. Suboptimal image quality limits for assessment of left ventricular hypertrophy. Left ventricular diastolic parameters are indeterminate. Elevated left atrial pressure. Right Ventricle: The right ventricular size is normal. No increase in right  ventricular wall thickness. Right ventricular systolic function is normal. Tricuspid regurgitation signal is inadequate for assessing PA pressure. Left Atrium: Left atrial size was normal in size. Right Atrium: Right atrial size was normal in size. Pericardium: There is no evidence of pericardial effusion. Mitral Valve: The mitral valve is normal in structure. No evidence of mitral valve regurgitation. No evidence of mitral  valve stenosis. MV peak gradient, 4.5 mmHg. The mean mitral valve gradient is 1.0 mmHg. Tricuspid Valve: The tricuspid valve is normal in structure. Tricuspid valve regurgitation is not demonstrated. No evidence of tricuspid stenosis. Aortic Valve: The aortic valve is normal in structure. Aortic valve regurgitation is trivial. Aortic regurgitation PHT measures 493 msec. No aortic stenosis is present. Aortic valve mean gradient measures 2.0 mmHg. Aortic valve peak gradient measures 3.8  mmHg. Aortic valve area, by VTI measures 2.00 cm. Pulmonic Valve: The pulmonic valve was normal in structure. Pulmonic valve regurgitation is trivial. No evidence of pulmonic stenosis. Aorta: The aortic root and ascending aorta are structurally normal, with no evidence of dilitation. Venous: The inferior vena cava is dilated in size with less than 50% respiratory variability, suggesting right atrial pressure of 15 mmHg. IAS/Shunts: The atrial septum is grossly normal.  LEFT VENTRICLE PLAX 2D LVIDd:         4.35 cm      Diastology LVIDs:         3.20 cm      LV e' medial:    4.03 cm/s LV PW:         0.95 cm      LV E/e' medial:  13.7 LV IVS:        0.95 cm      LV e' lateral:   2.50 cm/s LVOT diam:     1.70 cm      LV E/e' lateral: 22.1 LV SV:         42 LV SV Index:   30 LVOT Area:     2.27 cm  LV Volumes (MOD) LV vol d, MOD A2C: 106.0 ml LV vol d, MOD A4C: 87.2 ml LV vol s, MOD A2C: 38.8 ml LV vol s, MOD A4C: 44.7 ml LV SV MOD A2C:     67.2 ml LV SV MOD A4C:     87.2 ml LV SV MOD BP:      53.9 ml RIGHT VENTRICLE              IVC RV Basal diam:  3.80 cm     IVC diam: 2.20 cm RV Mid diam:    2.60 cm RV S prime:     10.90 cm/s TAPSE (M-mode): 1.6 cm LEFT ATRIUM             Index        RIGHT ATRIUM           Index LA diam:        2.40 cm 1.74 cm/m   RA Area:     11.60 cm LA Vol (A2C):   38.5 ml 27.91 ml/m  RA Volume:   26.40 ml  19.14 ml/m LA Vol (A4C):   19.9 ml 14.43 ml/m LA Biplane Vol: 28.2 ml 20.44 ml/m  AORTIC VALVE                    PULMONIC VALVE AV Area (Vmax):    1.82 cm     PV Vmax:          0.78 m/s AV Area (Vmean):   1.46 cm     PV Peak grad:     2.4 mmHg AV Area (VTI):     2.00 cm     PR End Diast Vel: 3.99 msec AV Vmax:           97.40 cm/s AV Vmean:          69.600  cm/s AV VTI:            0.210 m AV Peak Grad:      3.8 mmHg AV Mean Grad:      2.0 mmHg LVOT Vmax:         78.30 cm/s LVOT Vmean:        44.700 cm/s LVOT VTI:          0.185 m LVOT/AV VTI ratio: 0.88 AI PHT:            493 msec  AORTA Ao Root diam: 2.90 cm Ao Asc diam:  2.60 cm MITRAL VALVE MV Area (PHT): 2.95 cm    SHUNTS MV Area VTI:   1.85 cm    Systemic VTI:  0.18 m MV Peak grad:  4.5 mmHg    Systemic Diam: 1.70 cm MV Mean grad:  1.0 mmHg MV Vmax:       1.06 m/s MV Vmean:      45.2 cm/s MV Decel Time: 257 msec MV E velocity: 55.20 cm/s MV A velocity: 88.50 cm/s MV E/A ratio:  0.62 Riley Lam MD Electronically signed by Riley Lam MD Signature Date/Time: 07/12/2023/3:37:41 PM    Final      Scheduled Meds:  aspirin EC  81 mg Oral Daily   enoxaparin (LOVENOX) injection  30 mg Subcutaneous Q24H   furosemide  20 mg Intravenous Daily   levETIRAcetam  500 mg Oral BID   levothyroxine  88 mcg Oral QAC breakfast   LORazepam  1 mg Intramuscular Once   magnesium oxide  400 mg Oral Once   metoprolol tartrate  25 mg Oral BID   mometasone-formoterol  2 puff Inhalation BID   And   umeclidinium bromide  1 puff Inhalation Daily   pantoprazole  40 mg Oral Daily   potassium chloride  40 mEq Oral Once   [START ON  07/14/2023] rosuvastatin  10 mg Oral Daily   Continuous Infusions:     LOS: 2 days   Joycelyn Das, MD [facility] If 7PM-7AM, please contact night-coverage [email or web address]  07/13/2023, 4:42 PM

## 2023-07-14 DIAGNOSIS — I214 Non-ST elevation (NSTEMI) myocardial infarction: Secondary | ICD-10-CM | POA: Diagnosis not present

## 2023-07-14 DIAGNOSIS — I5033 Acute on chronic diastolic (congestive) heart failure: Secondary | ICD-10-CM | POA: Diagnosis not present

## 2023-07-14 LAB — CBC
HCT: 31.8 % — ABNORMAL LOW (ref 36.0–46.0)
Hemoglobin: 9.8 g/dL — ABNORMAL LOW (ref 12.0–15.0)
MCH: 28.2 pg (ref 26.0–34.0)
MCHC: 30.8 g/dL (ref 30.0–36.0)
MCV: 91.6 fL (ref 80.0–100.0)
Platelets: 203 10*3/uL (ref 150–400)
RBC: 3.47 MIL/uL — ABNORMAL LOW (ref 3.87–5.11)
RDW: 16.5 % — ABNORMAL HIGH (ref 11.5–15.5)
WBC: 9.9 10*3/uL (ref 4.0–10.5)
nRBC: 0 % (ref 0.0–0.2)

## 2023-07-14 LAB — BASIC METABOLIC PANEL
Anion gap: 6 (ref 5–15)
BUN: 16 mg/dL (ref 8–23)
CO2: 30 mmol/L (ref 22–32)
Calcium: 8.2 mg/dL — ABNORMAL LOW (ref 8.9–10.3)
Chloride: 97 mmol/L — ABNORMAL LOW (ref 98–111)
Creatinine, Ser: 0.58 mg/dL (ref 0.44–1.00)
GFR, Estimated: >60 mL/min (ref >60)
Glucose, Bld: 108 mg/dL — ABNORMAL HIGH (ref 70–99)
Potassium: 4.2 mmol/L (ref 3.5–5.1)
Sodium: 133 mmol/L — ABNORMAL LOW (ref 135–145)

## 2023-07-14 LAB — MAGNESIUM: Magnesium: 2 mg/dL (ref 1.7–2.4)

## 2023-07-14 MED ORDER — FUROSEMIDE 20 MG PO TABS: 20.0000 mg | ORAL_TABLET | Freq: Every day | ORAL | Status: DC

## 2023-07-14 MED ORDER — METOPROLOL TARTRATE 25 MG PO TABS
25.0000 mg | ORAL_TABLET | Freq: Two times a day (BID) | ORAL | Status: AC
  Administered 2023-07-14: 25 mg via ORAL
  Filled 2023-07-14: qty 1

## 2023-07-14 MED ORDER — FUROSEMIDE 10 MG/ML IJ SOLN
20.0000 mg | Freq: Every day | INTRAMUSCULAR | Status: DC
  Filled 2023-07-14 (×2): qty 2

## 2023-07-14 MED ORDER — METOPROLOL SUCCINATE ER 50 MG PO TB24
50.0000 mg | ORAL_TABLET | Freq: Every day | ORAL | Status: DC
  Administered 2023-07-15 – 2023-07-18 (×4): 50 mg via ORAL
  Filled 2023-07-14 (×4): qty 1

## 2023-07-14 MED ORDER — METOPROLOL SUCCINATE ER 50 MG PO TB24: 50.0000 mg | ORAL_TABLET | Freq: Every day | ORAL | Status: DC

## 2023-07-14 NOTE — Progress Notes (Signed)
PROGRESS NOTE    Kendra Watts  UEA:[id number] DOB: September 01, 1945 DOA: 07/11/2023 PCP: Georgianne Fick, MD   Brief Narrative:  77 year old female with past medical history significant for COPD on chronic oxygen, peripheral artery disease with renal artery stenosis s/p renal artery stenting 2011 and 2016, carotid stenosis, bilateral iliac disease as well as chronic heart failure with preserved ejection fraction, hypertension, hypothyroidism, treated lung cancer being admitted with non-STEMI.  Patient presents complaining of shortness of breath associated for the last 2 days.  She denies chest pain.  She uses 3 L of oxygen at home she has chronic cough.  Assessment & Plan:   Principal Problem:   NSTEMI (non-ST elevated myocardial infarction) (HCC) Active Problems:   Acute on chronic diastolic heart failure (HCC)  Non-STEMI:  Troponin was at 2600 and patient presented with dyspnea with some ST depression and nonspecific T wave changes.  Seen by cardiology and was started on heparin drip.  Status post heparin drip for 48 hours.  No further chest pain or shortness of breath.  Continue metoprolol.    Pulmonary nodule:  Enlarging now 1.6 cm superior segment of the left lower lobe.  Follows up with Dr. Delton Coombes, pulmonary as outpatient.   Plan was to repeat another CT chest in 6 months she is due January 8.  Will need to follow-up for pulmonary nodule as outpatient.  Informed the patient and sister about it yesterday on 07/13/2023  Acute on Chronic HFpEF still with volume overload 2D echocardiogram from 4/23 with LV ejection fraction of 55 to 60% with diastolic dysfunction.  BNP  Elevated.  Patient receiving IV Lasix.  On 20 mg daily 1 additional dose will be given as per cardiology.  Continue to monitor.    Essential Hypertension:  on amlodipine and Lopressor. On Cardura at home currently on hold.  Blood pressure seems to be stable.  COPD on chronic 3 L of oxygen:  -Continue Dulera,  Incruse,  as needed albuterol.  Currently on 3L of oxygen.  Seizure disorder, with history of stroke:  -Continue Keppra  Acute metabolic encephalopathy; agitation.  Improved at this time has sitter at bedside but appears to be calm.  Hyperlipidemia:  Continue with Crestor  Hypokalemia: Improved after repletion.  Potassium today at 3.8.  Hypothyroidism: Continue Synthroid.   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 -crestor  Prolonged QT: Potassium replenished.  PAD: Extensive PAD history with renal artery stenosis status post bilateral renal artery stenting, carotid disease status post right CEA, mild lower extremity arterial disease treated medically.  Patient was on aspirin and statins as outpatient.  Debility deconditioning.  Will get PT evaluation.  Lives in her apartment.  Went to [facility] in the past.  Likely need skilled nursing facility on discharge.  DVT prophylaxis: Lovenox subcu after stopping heparin drip.  Code Status: full code  Family Communication:  Spoke with the patient's sister [name] on 07/13/2023 on the phone.  Disposition Plan: Home  Status is: Inpatient  Remains inpatient appropriate because: management of Non STEMI, IV Lasix, PT OT pending  Consultants:  Cardiology   Procedures:  none  Antimicrobials:    Subjective:  Today, patient was seen and examined at bedside.  Patient denies increasing shortness of breath or chest pain today but appears very weak with some sleep overnight.    Objective: Vitals:   07/13/23 2005 07/14/23 0020 07/14/23 0249 07/14/23 1302  BP:  130/87 117/74 (!) 152/69  Pulse:  84 87 86  Resp:  Marland Kitchen)  23 (!) 23 20  Temp:  100.1 F (37.8 C) 98.5 F (36.9 C) 98.8 F (37.1 C)  TempSrc:  Oral Oral Oral  SpO2: 97% 100% 99% 97%  Weight:      Height:        Intake/Output Summary (Last 24 hours) at 07/14/2023 1349 Last data filed at 07/14/2023 0230 Gross per 24 hour  Intake --  Output 1020 ml  Net  -1020 ml   Filed Weights   07/11/23 0121 07/11/23 1214  Weight: 36.3 kg 39.9 kg    Physical examination:  Body mass index is 15.09 kg/m.   General: Thinly built, not in obvious distress hard of hearing, on nasal cannula oxygen HENT:   No scleral pallor or icterus noted. Oral mucosa is moist.  Distended neck vein. Chest:  .  Diminished breath sounds bilaterally. No crackles or wheezes.  CVS: S1 &S2 heard. No murmur.  Regular rate and rhythm. Abdomen: Soft, nontender, nondistended.  Bowel sounds are heard.   Extremities: No cyanosis, clubbing  peripheral pulses are palpable. Psych: Alert, awake and Communicative. CNS:  No cranial nerve deficits generalized weakness. Skin: Warm and dry.  No rashes noted.  Data Reviewed: I have personally reviewed following labs and imaging studies  CBC: Recent Labs  Lab 07/11/23 0841 07/12/23 0742 07/13/23 0745 07/14/23 0713  WBC 10.0 9.1 8.3 9.9  NEUTROABS 7.9*  --   --   --   HGB 11.0* 9.9* 9.7* 9.8*  HCT 33.7* 32.7* 32.0* 31.8*  MCV 87.1 91.9 92.5 91.6  PLT 262 238 222 203   Basic Metabolic Panel: Recent Labs  Lab 07/11/23 0841 07/12/23 0742 07/13/23 0745 07/14/23 0713  NA 135 140 134* 133*  K 3.1* 3.2* 3.8 4.2  CL 90* 99 98 97*  CO2 32 31 27 30   GLUCOSE 102* 86 96 108*  BUN 24* 14 13 16   CREATININE 0.83 0.61 0.64 0.58  CALCIUM 9.0 8.2* 8.0* 8.2*  MG  --   --  1.8 2.0   GFR: Estimated Creatinine Clearance: 37.1 mL/min (by C-G formula based on SCr of 0.58 mg/dL). Liver Function Tests: Recent Labs  Lab 07/11/23 0841 07/12/23 0742  AST 89* 62*  ALT 78* 63*  ALKPHOS 84 68  BILITOT 0.7 0.8  PROT 7.7 6.1*  ALBUMIN 4.4 3.5   No results for input(s): "LIPASE", "AMYLASE" in the last 168 hours. No results for input(s): "AMMONIA" in the last 168 hours. Coagulation Profile: Recent Labs  Lab 07/11/23 0841  INR 1.1   Cardiac Enzymes: No results for input(s): "CKTOTAL", "CKMB", "CKMBINDEX", "TROPONINI" in the last 168  hours. BNP (last 3 results) No results for input(s): "PROBNP" in the last 8760 hours. HbA1C: No results for input(s): "HGBA1C" in the last 72 hours. CBG: No results for input(s): "GLUCAP" in the last 168 hours. Lipid Profile: Recent Labs    07/12/23 1441  CHOL 133  HDL 78  LDLCALC 45  TRIG 52  CHOLHDL 1.7   Thyroid Function Tests: No results for input(s): "TSH", "T4TOTAL", "FREET4", "T3FREE", "THYROIDAB" in the last 72 hours. Anemia Panel: No results for input(s): "VITAMINB12", "FOLATE", "FERRITIN", "TIBC", "IRON", "RETICCTPCT" in the last 72 hours. Sepsis Labs: No results for input(s): "PROCALCITON", "LATICACIDVEN" in the last 168 hours.  Recent Results (from the past 240 hours)  Resp panel by RT-PCR (RSV, Flu A&B, Covid) Anterior Nasal Swab     Status: None   Collection Time: 07/11/23  9:03 AM   Specimen: Anterior Nasal Swab  Result  Value Ref Range Status   SARS Coronavirus 2 by RT PCR NEGATIVE NEGATIVE Final    Comment: (NOTE) SARS-CoV-2 target nucleic acids are NOT DETECTED.  The SARS-CoV-2 RNA is generally detectable in upper respiratory specimens during the acute phase of infection. The lowest concentration of SARS-CoV-2 viral copies this assay can detect is 138 copies/mL. A negative result does not preclude SARS-Cov-2 infection and should not be used as the sole basis for treatment or other patient management decisions. A negative result may occur with  improper specimen collection/handling, submission of specimen other than nasopharyngeal swab, presence of viral mutation(s) within the areas targeted by this assay, and inadequate number of viral copies(<138 copies/mL). A negative result must be combined with clinical observations, patient history, and epidemiological information. The expected result is Negative.  Fact Sheet for Patients:  BloggerCourse.com  Fact Sheet for Healthcare Providers:   SeriousBroker.it  This test is no t yet approved or cleared by the [place] FDA and  has been authorized for detection and/or diagnosis of SARS-CoV-2 by FDA under an Emergency Use Authorization (EUA). This EUA will remain  in effect (meaning this test can be used) for the duration of the COVID-19 declaration under Section 564(b)(1) of the Act, 21 U.S.C.section 360bbb-3(b)(1), unless the authorization is terminated  or revoked sooner.       Influenza A by PCR NEGATIVE NEGATIVE Final   Influenza B by PCR NEGATIVE NEGATIVE Final    Comment: (NOTE) The Xpert Xpress SARS-CoV-2/FLU/RSV plus assay is intended as an aid in the diagnosis of influenza from Nasopharyngeal swab specimens and should not be used as a sole basis for treatment. Nasal washings and aspirates are unacceptable for Xpert Xpress SARS-CoV-2/FLU/RSV testing.  Fact Sheet for Patients: BloggerCourse.com  Fact Sheet for Healthcare Providers: SeriousBroker.it  This test is not yet approved or cleared by the [place] FDA and has been authorized for detection and/or diagnosis of SARS-CoV-2 by FDA under an Emergency Use Authorization (EUA). This EUA will remain in effect (meaning this test can be used) for the duration of the COVID-19 declaration under Section 564(b)(1) of the Act, 21 U.S.C. section 360bbb-3(b)(1), unless the authorization is terminated or revoked.     Resp Syncytial Virus by PCR NEGATIVE NEGATIVE Final    Comment: (NOTE) Fact Sheet for Patients: BloggerCourse.com  Fact Sheet for Healthcare Providers: SeriousBroker.it  This test is not yet approved or cleared by the [place] FDA and has been authorized for detection and/or diagnosis of SARS-CoV-2 by FDA under an Emergency Use Authorization (EUA). This EUA will remain in effect (meaning this test can be used) for  the duration of the COVID-19 declaration under Section 564(b)(1) of the Act, 21 U.S.C. section 360bbb-3(b)(1), unless the authorization is terminated or revoked.  Performed at [facility], [address], [place], [place] [address]      Radiology Studies: CT HEAD WO CONTRAST ( ) Result Date: 07/12/2023 CLINICAL DATA:  Altered mental status EXAM: CT HEAD WITHOUT CONTRAST TECHNIQUE: Contiguous axial images were obtained from the base of the skull through the vertex without intravenous contrast. RADIATION DOSE REDUCTION: This exam was performed according to the departmental dose-optimization program which includes automated exposure control, adjustment of the mA and/or kV according to patient size and/or use of iterative reconstruction technique. COMPARISON:  11/12/2021 FINDINGS: Brain: There is no mass, hemorrhage or extra-axial collection. The size and configuration of the ventricles and extra-axial CSF spaces are normal. There is hypoattenuation of the white matter, most commonly indicating  chronic small vessel disease. Vascular: Atherosclerotic calcification of the internal carotid arteries at the skull base. No abnormal hyperdensity of the major intracranial arteries or dural venous sinuses. Skull: The visualized skull base, calvarium and extracranial soft tissues are normal. Sinuses/Orbits: No fluid levels or advanced mucosal thickening of the visualized paranasal sinuses. No mastoid or middle ear effusion. Normal orbits. Other: None. IMPRESSION: 1. No acute intracranial abnormality. 2. Chronic small vessel disease. Electronically Signed   By: Deatra Robinson M.D.   On: 07/12/2023 21:16   ECHOCARDIOGRAM COMPLETE Result Date: 07/12/2023    ECHOCARDIOGRAM REPORT   Patient Name:   ARLINA WHATLEY [facility] Date of Exam: 07/12/2023 Medical Rec #:  [id number]       Height:       64.0 in Accession #:    [id number]      Weight:       87.9 lb Date of Birth:  01-13-1946       BSA:          1.379  m Patient Age:    77 years        BP:           107/63 mmHg Patient Gender: F               HR:           65 bpm. Exam Location:  Inpatient Procedure: 2D Echo, Cardiac Doppler and Color Doppler Indications:    NSTEMI  History:        Patient has prior history of Echocardiogram examinations, most                 recent 11/13/2021. COPD; Risk Factors:Hypertension and Current                 Smoker.  Sonographer:    Vern Claude Referring Phys: [id number] CALLIE E GOODRICH IMPRESSIONS  1. Left ventricular ejection fraction, by estimation, is 55 to 60%. The left ventricle has normal function. The left ventricle has no regional wall motion abnormalities. Left ventricular diastolic parameters are indeterminate. Elevated left atrial pressure.  2. Right ventricular systolic function is normal. The right ventricular size is normal. Tricuspid regurgitation signal is inadequate for assessing PA pressure.  3. The mitral valve is normal in structure. No evidence of mitral valve regurgitation. No evidence of mitral stenosis.  4. The aortic valve is normal in structure. Aortic valve regurgitation is trivial. No aortic stenosis is present.  5. The inferior vena cava is dilated in size with <50% respiratory variability, suggesting right atrial pressure of 15 mmHg. Comparison(s): No significant change from prior study. Prior images reviewed side by side. FINDINGS  Left Ventricle: Left ventricular ejection fraction, by estimation, is 55 to 60%. The left ventricle has normal function. The left ventricle has no regional wall motion abnormalities. The left ventricular internal cavity size was normal in size. Suboptimal image quality limits for assessment of left ventricular hypertrophy. Left ventricular diastolic parameters are indeterminate. Elevated left atrial pressure. Right Ventricle: The right ventricular size is normal. No increase in right ventricular wall thickness. Right ventricular systolic function is normal. Tricuspid  regurgitation signal is inadequate for assessing PA pressure. Left Atrium: Left atrial size was normal in size. Right Atrium: Right atrial size was normal in size. Pericardium: There is no evidence of pericardial effusion. Mitral Valve: The mitral valve is normal in structure. No evidence of mitral valve regurgitation. No evidence of mitral valve stenosis. MV peak gradient, 4.5 mmHg. The mean mitral  valve gradient is 1.0 mmHg. Tricuspid Valve: The tricuspid valve is normal in structure. Tricuspid valve regurgitation is not demonstrated. No evidence of tricuspid stenosis. Aortic Valve: The aortic valve is normal in structure. Aortic valve regurgitation is trivial. Aortic regurgitation PHT measures 493 msec. No aortic stenosis is present. Aortic valve mean gradient measures 2.0 mmHg. Aortic valve peak gradient measures 3.8  mmHg. Aortic valve area, by VTI measures 2.00 cm. Pulmonic Valve: The pulmonic valve was normal in structure. Pulmonic valve regurgitation is trivial. No evidence of pulmonic stenosis. Aorta: The aortic root and ascending aorta are structurally normal, with no evidence of dilitation. Venous: The inferior vena cava is dilated in size with less than 50% respiratory variability, suggesting right atrial pressure of 15 mmHg. IAS/Shunts: The atrial septum is grossly normal.  LEFT VENTRICLE PLAX 2D LVIDd:         4.35 cm      Diastology LVIDs:         3.20 cm      LV e' medial:    4.03 cm/s LV PW:         0.95 cm      LV E/e' medial:  13.7 LV IVS:        0.95 cm      LV e' lateral:   2.50 cm/s LVOT diam:     1.70 cm      LV E/e' lateral: 22.1 LV SV:         42 LV SV Index:   30 LVOT Area:     2.27 cm  LV Volumes (MOD) LV vol d, MOD A2C: 106.0 ml LV vol d, MOD A4C: 87.2 ml LV vol s, MOD A2C: 38.8 ml LV vol s, MOD A4C: 44.7 ml LV SV MOD A2C:     67.2 ml LV SV MOD A4C:     87.2 ml LV SV MOD BP:      53.9 ml RIGHT VENTRICLE             IVC RV Basal diam:  3.80 cm     IVC diam: 2.20 cm RV Mid diam:    2.60  cm RV S prime:     10.90 cm/s TAPSE (M-mode): 1.6 cm LEFT ATRIUM             Index        RIGHT ATRIUM           Index LA diam:        2.40 cm 1.74 cm/m   RA Area:     11.60 cm LA Vol (A2C):   38.5 ml 27.91 ml/m  RA Volume:   26.40 ml  19.14 ml/m LA Vol (A4C):   19.9 ml 14.43 ml/m LA Biplane Vol: 28.2 ml 20.44 ml/m  AORTIC VALVE                    PULMONIC VALVE AV Area (Vmax):    1.82 cm     PV Vmax:          0.78 m/s AV Area (Vmean):   1.46 cm     PV Peak grad:     2.4 mmHg AV Area (VTI):     2.00 cm     PR End Diast Vel: 3.99 msec AV Vmax:           97.40 cm/s AV Vmean:          69.600 cm/s AV VTI:  0.210 m AV Peak Grad:      3.8 mmHg AV Mean Grad:      2.0 mmHg LVOT Vmax:         78.30 cm/s LVOT Vmean:        44.700 cm/s LVOT VTI:          0.185 m LVOT/AV VTI ratio: 0.88 AI PHT:            493 msec  AORTA Ao Root diam: 2.90 cm Ao Asc diam:  2.60 cm MITRAL VALVE MV Area (PHT): 2.95 cm    SHUNTS MV Area VTI:   1.85 cm    Systemic VTI:  0.18 m MV Peak grad:  4.5 mmHg    Systemic Diam: 1.70 cm MV Mean grad:  1.0 mmHg MV Vmax:       1.06 m/s MV Vmean:      45.2 cm/s MV Decel Time: 257 msec MV E velocity: 55.20 cm/s MV A velocity: 88.50 cm/s MV E/A ratio:  0.62 Riley Lam MD Electronically signed by Riley Lam MD Signature Date/Time: 07/12/2023/3:37:41 PM    Final      Scheduled Meds:  aspirin EC  81 mg Oral Daily   enoxaparin (LOVENOX) injection  30 mg Subcutaneous Q24H   furosemide  20 mg Intravenous Daily   levETIRAcetam  500 mg Oral BID   levothyroxine  88 mcg Oral QAC breakfast   LORazepam  1 mg Intramuscular Once   [START ON 07/15/2023] metoprolol succinate  50 mg Oral Daily   metoprolol tartrate  25 mg Oral BID   mometasone-formoterol  2 puff Inhalation BID   And   umeclidinium bromide  1 puff Inhalation Daily   pantoprazole  40 mg Oral Daily   rosuvastatin  10 mg Oral Daily   Continuous Infusions:     LOS: 3 days   Joycelyn Das, MD [facility] If 7PM-7AM, please contact night-coverage [email or web address]  07/14/2023, 1:49 PM

## 2023-07-14 NOTE — Progress Notes (Signed)
Rounding Note    Patient Name: Kendra Watts Date of Encounter: 07/14/2023  [facility] Cardiologist: Nanetta Batty, MD   Subjective   Her vitals are stable she remains on 3 L of oxygen.  Renal function is normal  Inpatient Medications    Scheduled Meds:  aspirin EC  81 mg Oral Daily   enoxaparin (LOVENOX) injection  30 mg Subcutaneous Q24H   furosemide  20 mg Intravenous Daily   levETIRAcetam  500 mg Oral BID   levothyroxine  88 mcg Oral QAC breakfast   LORazepam  1 mg Intramuscular Once   metoprolol tartrate  25 mg Oral BID   mometasone-formoterol  2 puff Inhalation BID   And   umeclidinium bromide  1 puff Inhalation Daily   pantoprazole  40 mg Oral Daily   rosuvastatin  10 mg Oral Daily   Continuous Infusions:   PRN Meds: acetaminophen **OR** acetaminophen, albuterol, LORazepam, morphine injection   Vital Signs    Vitals:   07/13/23 1923 07/13/23 2005 07/14/23 0020 07/14/23 0249  BP: 122/80  130/87 117/74  Pulse: 91  84 87  Resp: 20  (!) 23 (!) 23  Temp: 99.5 F (37.5 C)  100.1 F (37.8 C) 98.5 F (36.9 C)  TempSrc: Oral  Oral Oral  SpO2: 99% 97% 100% 99%  Weight:      Height:        Intake/Output Summary (Last 24 hours) at 07/14/2023 1150 Last data filed at 07/14/2023 0230 Gross per 24 hour  Intake --  Output 1020 ml  Net -1020 ml      07/11/2023   12:14 PM 07/11/2023    1:21 AM 05/20/2023    2:28 PM  Last 3 Weights  Weight (lbs) 87 lb 14.4 oz 80 lb 81 lb  Weight (kg) 39.871 kg 36.288 kg 36.741 kg      Telemetry    Normal sinus rhythm, PACs- Personally Reviewed  ECG    Sinus tachycardia, right atrial enlargement, QTc - Personally Reviewed  Physical Exam   GEN: No acute distress.   Neck: + JVD Cardiac: RRR, no murmurs, rubs, or gallops.  Respiratory: Diminished breath sounds GI: Soft, nontender, non-distended  MS: No edema; No deformity. Neuro:  Nonfocal  Psych: Normal affect   Labs    High Sensitivity  Troponin:   Recent Labs  Lab 07/11/23 0841 07/11/23 1133 07/12/23 0742  TROPONINIHS 2,449* 2,615* 1,371*     Chemistry Recent Labs  Lab 07/11/23 0841 07/12/23 0742 07/13/23 0745 07/14/23 0713  NA 135 140 134* 133*  K 3.1* 3.2* 3.8 4.2  CL 90* 99 98 97*  CO2 32 31 27 30   GLUCOSE 102* 86 96 108*  BUN 24* 14 13 16   CREATININE 0.83 0.61 0.64 0.58  CALCIUM 9.0 8.2* 8.0* 8.2*  MG  --   --  1.8 2.0  PROT 7.7 6.1*  --   --   ALBUMIN 4.4 3.5  --   --   AST 89* 62*  --   --   ALT 78* 63*  --   --   ALKPHOS 84 68  --   --   BILITOT 0.7 0.8  --   --   GFRNONAA >60 >60 >60 >60  ANIONGAP 13 10 9 6     Lipids  Recent Labs  Lab 07/12/23 1441  CHOL 133  TRIG 52  HDL 78  LDLCALC 45  CHOLHDL 1.7    Hematology Recent Labs  Lab 07/12/23 [phone]  07/13/23 0745 07/14/23 0713  WBC 9.1 8.3 9.9  RBC 3.56* 3.46* 3.47*  HGB 9.9* 9.7* 9.8*  HCT 32.7* 32.0* 31.8*  MCV 91.9 92.5 91.6  MCH 27.8 28.0 28.2  MCHC 30.3 30.3 30.8  RDW 16.4* 16.7* 16.5*  PLT 238 222 203   Thyroid No results for input(s): "TSH", "FREET4" in the last 168 hours.  BNP Recent Labs  Lab 07/11/23 0841  BNP 1,494.1*    DDimer No results for input(s): "DDIMER" in the last 168 hours.   Radiology    CT HEAD WO CONTRAST ( ) Result Date: 07/12/2023 CLINICAL DATA:  Altered mental status EXAM: CT HEAD WITHOUT CONTRAST TECHNIQUE: Contiguous axial images were obtained from the base of the skull through the vertex without intravenous contrast. RADIATION DOSE REDUCTION: This exam was performed according to the departmental dose-optimization program which includes automated exposure control, adjustment of the mA and/or kV according to patient size and/or use of iterative reconstruction technique. COMPARISON:  11/12/2021 FINDINGS: Brain: There is no mass, hemorrhage or extra-axial collection. The size and configuration of the ventricles and extra-axial CSF spaces are normal. There is hypoattenuation of the white matter,  most commonly indicating chronic small vessel disease. Vascular: Atherosclerotic calcification of the internal carotid arteries at the skull base. No abnormal hyperdensity of the major intracranial arteries or dural venous sinuses. Skull: The visualized skull base, calvarium and extracranial soft tissues are normal. Sinuses/Orbits: No fluid levels or advanced mucosal thickening of the visualized paranasal sinuses. No mastoid or middle ear effusion. Normal orbits. Other: None. IMPRESSION: 1. No acute intracranial abnormality. 2. Chronic small vessel disease. Electronically Signed   By: Deatra Robinson M.D.   On: 07/12/2023 21:16   ECHOCARDIOGRAM COMPLETE Result Date: 07/12/2023    ECHOCARDIOGRAM REPORT   Patient Name:   Kendra Watts [facility] Date of Exam: 07/12/2023 Medical Rec #:  [id number]       Height:       64.0 in Accession #:    [id number]      Weight:       87.9 lb Date of Birth:  11/08/1945       BSA:          1.379 m Patient Age:    77 years        BP:           107/63 mmHg Patient Gender: F               HR:           65 bpm. Exam Location:  Inpatient Procedure: 2D Echo, Cardiac Doppler and Color Doppler Indications:    NSTEMI  History:        Patient has prior history of Echocardiogram examinations, most                 recent 11/13/2021. COPD; Risk Factors:Hypertension and Current                 Smoker.  Sonographer:    Vern Claude Referring Phys: [id number] CALLIE E GOODRICH IMPRESSIONS  1. Left ventricular ejection fraction, by estimation, is 55 to 60%. The left ventricle has normal function. The left ventricle has no regional wall motion abnormalities. Left ventricular diastolic parameters are indeterminate. Elevated left atrial pressure.  2. Right ventricular systolic function is normal. The right ventricular size is normal. Tricuspid regurgitation signal is inadequate for assessing PA pressure.  3. The mitral valve is normal in structure. No evidence of mitral valve regurgitation. No evidence  of mitral  stenosis.  4. The aortic valve is normal in structure. Aortic valve regurgitation is trivial. No aortic stenosis is present.  5. The inferior vena cava is dilated in size with <50% respiratory variability, suggesting right atrial pressure of 15 mmHg. Comparison(s): No significant change from prior study. Prior images reviewed side by side. FINDINGS  Left Ventricle: Left ventricular ejection fraction, by estimation, is 55 to 60%. The left ventricle has normal function. The left ventricle has no regional wall motion abnormalities. The left ventricular internal cavity size was normal in size. Suboptimal image quality limits for assessment of left ventricular hypertrophy. Left ventricular diastolic parameters are indeterminate. Elevated left atrial pressure. Right Ventricle: The right ventricular size is normal. No increase in right ventricular wall thickness. Right ventricular systolic function is normal. Tricuspid regurgitation signal is inadequate for assessing PA pressure. Left Atrium: Left atrial size was normal in size. Right Atrium: Right atrial size was normal in size. Pericardium: There is no evidence of pericardial effusion. Mitral Valve: The mitral valve is normal in structure. No evidence of mitral valve regurgitation. No evidence of mitral valve stenosis. MV peak gradient, 4.5 mmHg. The mean mitral valve gradient is 1.0 mmHg. Tricuspid Valve: The tricuspid valve is normal in structure. Tricuspid valve regurgitation is not demonstrated. No evidence of tricuspid stenosis. Aortic Valve: The aortic valve is normal in structure. Aortic valve regurgitation is trivial. Aortic regurgitation PHT measures 493 msec. No aortic stenosis is present. Aortic valve mean gradient measures 2.0 mmHg. Aortic valve peak gradient measures 3.8  mmHg. Aortic valve area, by VTI measures 2.00 cm. Pulmonic Valve: The pulmonic valve was normal in structure. Pulmonic valve regurgitation is trivial. No evidence of pulmonic stenosis.  Aorta: The aortic root and ascending aorta are structurally normal, with no evidence of dilitation. Venous: The inferior vena cava is dilated in size with less than 50% respiratory variability, suggesting right atrial pressure of 15 mmHg. IAS/Shunts: The atrial septum is grossly normal.  LEFT VENTRICLE PLAX 2D LVIDd:         4.35 cm      Diastology LVIDs:         3.20 cm      LV e' medial:    4.03 cm/s LV PW:         0.95 cm      LV E/e' medial:  13.7 LV IVS:        0.95 cm      LV e' lateral:   2.50 cm/s LVOT diam:     1.70 cm      LV E/e' lateral: 22.1 LV SV:         42 LV SV Index:   30 LVOT Area:     2.27 cm  LV Volumes (MOD) LV vol d, MOD A2C: 106.0 ml LV vol d, MOD A4C: 87.2 ml LV vol s, MOD A2C: 38.8 ml LV vol s, MOD A4C: 44.7 ml LV SV MOD A2C:     67.2 ml LV SV MOD A4C:     87.2 ml LV SV MOD BP:      53.9 ml RIGHT VENTRICLE             IVC RV Basal diam:  3.80 cm     IVC diam: 2.20 cm RV Mid diam:    2.60 cm RV S prime:     10.90 cm/s TAPSE (M-mode): 1.6 cm LEFT ATRIUM             Index  RIGHT ATRIUM           Index LA diam:        2.40 cm 1.74 cm/m   RA Area:     11.60 cm LA Vol (A2C):   38.5 ml 27.91 ml/m  RA Volume:   26.40 ml  19.14 ml/m LA Vol (A4C):   19.9 ml 14.43 ml/m LA Biplane Vol: 28.2 ml 20.44 ml/m  AORTIC VALVE                    PULMONIC VALVE AV Area (Vmax):    1.82 cm     PV Vmax:          0.78 m/s AV Area (Vmean):   1.46 cm     PV Peak grad:     2.4 mmHg AV Area (VTI):     2.00 cm     PR End Diast Vel: 3.99 msec AV Vmax:           97.40 cm/s AV Vmean:          69.600 cm/s AV VTI:            0.210 m AV Peak Grad:      3.8 mmHg AV Mean Grad:      2.0 mmHg LVOT Vmax:         78.30 cm/s LVOT Vmean:        44.700 cm/s LVOT VTI:          0.185 m LVOT/AV VTI ratio: 0.88 AI PHT:            493 msec  AORTA Ao Root diam: 2.90 cm Ao Asc diam:  2.60 cm MITRAL VALVE MV Area (PHT): 2.95 cm    SHUNTS MV Area VTI:   1.85 cm    Systemic VTI:  0.18 m MV Peak grad:  4.5 mmHg    Systemic Diam:  1.70 cm MV Mean grad:  1.0 mmHg MV Vmax:       1.06 m/s MV Vmean:      45.2 cm/s MV Decel Time: 257 msec MV E velocity: 55.20 cm/s MV A velocity: 88.50 cm/s MV E/A ratio:  0.62 Riley Lam MD Electronically signed by Riley Lam MD Signature Date/Time: 07/12/2023/3:37:41 PM    Final     Cardiac Studies   Echo 07/12/23: EF 55 to 60%, normal RV function, no significant valvular disease, RAP 15  Patient Profile     77 y.o. 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 High-sensitivity troponin 2,449 >> 2,615. EKG shows sinus tachycardia with slight ST depression in inferolateral leads and non-specific T wave changes. Chest CTA negative for PE but did show an enlarging solid nodule in the superior segment of the left lower lobe, suspicious for metastatic disease. She denies any chest pain.  Given frailty, memory issues, prior ICH, and enlarging lung  nodule concerning for metastatic disease and the fact that she is chest pain free and normal LV systolic function on echo, recommend medical management.  She is status post 48 hours of heparin -Status post 48 hours of IV heparin - Continue aspirin and statin. -Will transition Lopressor 25 mg BID ; to 50 mg XL daily tomorrow    Acute on Chronic HFpEF Last Echo  in 10/2021 showed LVEF of 55-60% with severe LVH and grade 1 diastolic dysfunction, normal RV, and no significant valvular disease. BNP elevated at 1,494 -She is lying flat comfortably however she remains on oxygen.  O2 status is complicated with history of COPD.  Can continue IV diuretic until her oxygen is weaned as long as her renal function can tolerate.  Then can switch to Lasix 20 mg p.o. daily   PAD Patient has a long history of PAD with renal artery stenosis (s/p bilateral renal artery stenting), carotid disease (s/p right CEA), and mild lower extremity arterial disease (treated medically) - Continue aspirin and statin. - Can continue to follow as an outpatient.    Hypertension - Will start Lopressor 25mg  twice daily as above. - She is on Cardura 8mg  daily at home but this has not been restarted. Can restart if needed.  Has been normotensive   Hyperlipidemia -Continue Crestor 10 mg daily, LDL 45 - LFTs mildly elevated on admission (AST 89, ALT 78) so will need to monitor this closely.   Hypokalemia Potassium 3.1 on admission.  Repleted   Prolonged QTc QTc >> 511 ms on admission; unchanged avoid QTc prolonging medications -Replete potassium for goal greater than 4, magnesium for goal greater than 2.  Monitor QTc   Otherwise, per primary team: - COPD with chronic hypoxic respiratory failure on 3-4 L of O2 at home - Lung cancer  - Hypothyroidism - Type 2 diabetes mellitus - Chronic anemia  - Seizure disorder - History of ICH   Hopefully she can transition to oral diuretics soon; recommend aiming to wean her O2.    Time Spent Directly with Patient:  I have spent a total of 35 minutes with the patient reviewing hospital notes, telemetry, EKGs, labs and examining the patient as well as establishing an assessment and plan that was discussed personally with the patient.     For questions or updates, please contact [facility] Please consult [email or web address] for  contact info under        Signed, Maisie Fus, MD  07/14/2023, 11:50 AM

## 2023-07-14 NOTE — TOC Initial Note (Signed)
Transition of Care [facility]) - Initial/Assessment Note    Patient Details  Name: Kendra Watts MRN: [id number] Date of Birth: 04-08-1946  Transition of [facility]) CM/SW Contact:    Howell Rucks, RN Phone Number: 07/14/2023, 12:47 PM  Clinical Narrative:  Met with pt at bedside to introduce role of TOC/NCM and review for dc planning, pt with 1:1 sitter, unable to assess at this time. NCM call to pt's sister, [name], introduced role of TOC/NCM and reviewed for dc planning. [name] reports pt is independent with self care and functional mobility at  baseline, has a walker she uses for as needed, reports pt has received short term rehab at [facility] in the past. PT eval pending, await recommendation. NCM will continue to update [name] with dc planning.   TOC will continue to follow.                 Expected Discharge Plan: Skilled Nursing Facility Barriers to Discharge: Continued Medical Work up   Patient Goals and CMS Choice Patient states their goals for this hospitalization and ongoing recovery are:: TBD          Expected Discharge Plan and Services       Living arrangements for the past 2 months: Apartment                                      Prior Living Arrangements/Services Living arrangements for the past 2 months: Apartment Lives with:: Self Patient language and need for interpreter reviewed:: Yes        Need for Family Participation in Patient Care: Yes (Comment) Care giver support system in place?: Yes (comment) Current home services: DME (walker) Criminal Activity/Legal Involvement Pertinent to Current Situation/Hospitalization: No - Comment as needed  Activities of Daily Living   ADL Screening (condition at time of admission) Independently performs ADLs?: Yes (appropriate for developmental age) Is the patient deaf or have difficulty hearing?: No Does the patient have difficulty seeing, even when wearing glasses/contacts?: No Does the patient have  difficulty concentrating, remembering, or making decisions?: No  Permission Sought/Granted                  Emotional Assessment Appearance:: Appears younger than stated age Attitude/Demeanor/Rapport: Unable to Assess Affect (typically observed): Unable to Assess   Alcohol / Substance Use: Not Applicable Psych Involvement: No (comment)  Admission diagnosis:  NSTEMI (non-ST elevated myocardial infarction) [facility]) [I21.4] Patient Active Problem List   Diagnosis Date Noted   NSTEMI (non-ST elevated myocardial infarction) (HCC) 07/11/2023   Acute cystitis 11/22/2021   Delirium due to multiple etiologies, acute, hyperactive 11/21/2021   Acute metabolic encephalopathy 11/21/2021   Hypothyroidism, unspecified 11/21/2021   Purulent bronchitis (HCC) 11/16/2021   Urinary retention 11/16/2021   DNR (do not resuscitate) 11/16/2021   Intracerebral hemorrhage 11/13/2021   Protein-calorie malnutrition, severe 11/13/2021   Status epilepticus (HCC)    Acute on chronic diastolic heart failure (HCC) 05/23/2020   COPD with acute exacerbation (HCC) 05/23/2020   Acute on chronic respiratory failure with hypoxia (HCC) 05/23/2020   Multiple closed fractures of ribs of left side    Fall    Palliative care by specialist    Goals of care, counseling/discussion    Advanced directives, counseling/discussion    Generalized weakness    Fracture of one rib, left side, initial encounter for closed fracture 04/08/2020   Fracture of greater trochanter  of left femur (HCC) 04/08/2020   Closed fracture of greater trochanter of left femur (HCC) 04/08/2020   Epistaxis 03/13/2020   Acute blood loss anemia 03/13/2020   Current smoker    Hypertension, uncontrolled    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)    Hemoptysis 09/14/2018   Squamous cell lung cancer, left (HCC) 09/10/2018   Adenocarcinoma, lung, 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   Multiple lung nodules 07/20/2018   Chronic obstructive pulmonary disease (HCC)  07/20/2018   Acute CHF (congestive heart failure) (HCC) 07/06/2018   Chronic respiratory failure with hypoxia (HCC) 07/06/2018   Hyponatremia 07/06/2018   Anemia 07/06/2018   Breast cancer of upper-outer quadrant of left female breast (HCC) 09/08/2015   Renal artery stenosis (HCC) 08/23/2014   Right upper extremity numbness 07/01/2013   Atherosclerotic renal artery stenosis, bilateral (HCC) 05/26/2013   Peripheral arterial disease (HCC) 05/26/2013   Essential hypertension 05/26/2013   Hyperlipidemia 05/26/2013   Tobacco abuse 05/26/2013   Carotid artery stenosis 03/09/2012   PCP:  Georgianne Fick, MD Pharmacy:   [facility] [phone] - [place], [place] - [address] AT [address] OF [facility] [address] [place] [address] Phone: [phone] Fax: [phone]     Social Drivers of Health (SDOH) Social History: SDOH Screenings   Food Insecurity: No Food Insecurity (07/12/2023)  Housing: Low Risk  (07/12/2023)  Transportation Needs: No Transportation Needs (07/12/2023)  Utilities: Not At Risk (07/12/2023)  Alcohol Screen: Low Risk  (06/20/2020)  Depression (PHQ2-9): Low Risk  (06/20/2020)  Financial Resource Strain: Low Risk  (06/20/2020)  Physical Activity: Insufficiently Active (06/20/2020)  Social Connections: Moderately Isolated (06/20/2020)  Stress: No Stress Concern Present (06/20/2020)  Tobacco Use: High Risk (07/11/2023)   SDOH Interventions:     Readmission Risk Interventions    07/14/2023   12:45 PM  Readmission Risk Prevention Plan  Transportation Screening Complete  PCP or Specialist Appt within 5-7 Days Complete  Home Care Screening Complete  Medication Review (RN CM) Complete

## 2023-07-14 NOTE — Evaluation (Signed)
Physical Therapy Evaluation Patient Details Name: Kendra Watts MRN: [id number] DOB: Nov 19, 1945 Today's Date: 07/14/2023  History of Present Illness  77 yo female admitted with NSTEMI, increased troponin level. Hx of lung Ca with liver mets, chronic resp failure-O2 dep, CHF, L hip fx, breast Ca, COPD, ICH, Sz, CVA, renal aretery stenosis, carotid stenosis  Clinical Impression  On eval, pt required Min A for mobility. Pt presents with general weakness, decreased activity tolerance, and impaired gait and balance. Sitter present in room. Pt tolerated activity fairly well. Will plan to follow and progress activity as tolerated. Pt lives alone and is Mod Ind with mobility at baseline. Patient will benefit from continued inpatient follow up therapy, <3 hours/day         If plan is discharge home, recommend the following: A little help with walking and/or transfers;A little help with bathing/dressing/bathroom;Assistance with cooking/housework;Assist for transportation;Help with stairs or ramp for entrance   Can travel by private vehicle        Equipment Recommendations None recommended by PT  Recommendations for Other Services       Functional Status Assessment Patient has had a recent decline in their functional status and demonstrates the ability to make significant improvements in function in a reasonable and predictable amount of time.     Precautions / Restrictions Precautions Precautions: Fall Precaution Comments: O2 dep Restrictions Weight Bearing Restrictions Per Provider Order: No      Mobility  Bed Mobility Overal bed mobility: Needs Assistance Bed Mobility: Supine to Sit, Sit to Supine     Supine to sit: Contact guard, HOB elevated, Used rails Sit to supine: Contact guard assist, HOB elevated, Used rails   General bed mobility comments: Increased time. Cues provided as needed.    Transfers Overall transfer level: Needs assistance Equipment used: Rolling walker (2  wheels) Transfers: Sit to/from Stand Sit to Stand: Min assist           General transfer comment: Assist to rise, steady, control descent. Cues for safety, hand placement.    Ambulation/Gait Ambulation/Gait assistance: Min assist   Assistive device: Rolling walker (2 wheels)         General Gait Details: Side steps along bedside with RW. Increased time. Assist to stabilize pt and manage RW. Remained on [place] O2.  Stairs            Wheelchair Mobility     Tilt Bed    Modified Rankin (Stroke Patients Only)       Balance Overall balance assessment: Needs assistance         Standing balance support: Bilateral upper extremity supported, Reliant on assistive device for balance, During functional activity Standing balance-Leahy Scale: Poor                               Pertinent Vitals/Pain Pain Assessment Pain Assessment: No/denies pain    Home Living Family/patient expects to be discharged to:: Unsure Living Arrangements: Alone Available Help at Discharge: Friend(s);Available PRN/intermittently Type of Home: Apartment Home Access: Stairs to enter       Home Layout: One level Home Equipment: Agricultural consultant (2 wheels);Grab bars - tub/shower      Prior Function Prior Level of Function : Needs assist             Mobility Comments: uses a walker ADLs Comments: mod ind ADLs. Friends help with transportation, grocery shopping     Extremity/Trunk Assessment  Upper Extremity Assessment Upper Extremity Assessment: Defer to OT evaluation    Lower Extremity Assessment Lower Extremity Assessment: Generalized weakness    Cervical / Trunk Assessment Cervical / Trunk Assessment: Normal  Communication   Communication Communication: Hearing impairment Cueing Techniques: Verbal cues  Cognition Arousal: Alert Behavior During Therapy: Flat affect Overall Cognitive Status: Within Functional Limits for tasks assessed                                           General Comments      Exercises     Assessment/Plan    PT Assessment Patient needs continued PT services  PT Problem List Decreased strength;Decreased activity tolerance;Decreased balance;Decreased mobility;Decreased knowledge of use of DME       PT Treatment Interventions DME instruction;Gait training;Functional mobility training;Therapeutic activities;Therapeutic exercise;Patient/family education;Balance training    PT Goals (Current goals can be found in the Care Plan section)  Acute Rehab PT Goals Patient Stated Goal: none stated PT Goal Formulation: With patient Time For Goal Achievement: 07/28/23 Potential to Achieve Goals: Good    Frequency Min 1X/week     Co-evaluation               AM-PAC PT "6 Clicks" Mobility  Outcome Measure Help needed turning from your back to your side while in a flat bed without using bedrails?: A Little Help needed moving from lying on your back to sitting on the side of a flat bed without using bedrails?: A Little Help needed moving to and from a bed to a chair (including a wheelchair)?: A Little Help needed standing up from a chair using your arms (e.g., wheelchair or bedside chair)?: A Little Help needed to walk in hospital room?: A Little Help needed climbing 3-5 steps with a railing? : A Lot 6 Click Score: 17    End of Session Equipment Utilized During Treatment: Oxygen Activity Tolerance: Patient tolerated treatment well Patient left: in bed;with call bell/phone within reach;with nursing/sitter in room   PT Visit Diagnosis: Muscle weakness (generalized) (M62.81)    Time: 1610-9604 PT Time Calculation (min) (ACUTE ONLY): 10 min   Charges:   PT Evaluation $PT Eval Low Complexity: 1 Low   PT General Charges $$ ACUTE PT VISIT: 1 Visit            Faye Ramsay, PT Acute Rehabilitation  Office: [phone]

## 2023-07-15 DIAGNOSIS — I214 Non-ST elevation (NSTEMI) myocardial infarction: Secondary | ICD-10-CM | POA: Diagnosis not present

## 2023-07-15 DIAGNOSIS — I5033 Acute on chronic diastolic (congestive) heart failure: Secondary | ICD-10-CM | POA: Diagnosis not present

## 2023-07-15 MED ORDER — FUROSEMIDE 20 MG PO TABS
20.0000 mg | ORAL_TABLET | Freq: Every day | ORAL | Status: DC
Start: 2023-07-15 — End: 2023-07-18
  Administered 2023-07-15 – 2023-07-18 (×4): 20 mg via ORAL
  Filled 2023-07-15 (×4): qty 1

## 2023-07-15 NOTE — Evaluation (Signed)
Occupational Therapy Evaluation Patient Details Name: Kendra Watts MRN: [id number] DOB: 15-Oct-1945 Today's Date: 07/15/2023   History of Present Illness patient is a 77 year old female admitted with NSTEMI, increased troponin level. Hx of lung Ca with liver mets, chronic resp failure-O2 dep, CHF, L hip fx, breast Ca, COPD, ICH, Sz, CVA, renal aretery stenosis, carotid stenosis   Clinical Impression   Patient is a 77 year old female who was admitted for above. Patient was living at home alone per patient report. Currently, patient is noted to have poor activity tolerance with fatigue with minimal tasks. Patient is quick to fall asleep after session is completed. Patient needed cues for washing UB sitting EOB to complete task. Patient was noted to have decreased functional activity tolerance, decreased endurance, decreased standing balance, decreased safety awareness, and decreased knowledge of AD/AE impacting participation in ADLs. Patient will benefit from continued inpatient follow up therapy, <3 hours/day       If plan is discharge home, recommend the following: Two people to help with walking and/or transfers;A lot of help with bathing/dressing/bathroom;Assistance with cooking/housework;Direct supervision/assist for medications management;Assist for transportation;Help with stairs or ramp for entrance;Direct supervision/assist for financial management    Functional Status Assessment  Patient has had a recent decline in their functional status and demonstrates the ability to make significant improvements in function in a reasonable and predictable amount of time.  Equipment Recommendations  None recommended by OT       Precautions / Restrictions Precautions Precautions: Fall Precaution Comments: O2 dep Restrictions Weight Bearing Restrictions Per Provider Order: No      Mobility Bed Mobility Overal bed mobility: Needs Assistance Bed Mobility: Supine to Sit, Sit to Supine      Supine to sit: Contact guard, HOB elevated, Used rails Sit to supine: Contact guard assist, HOB elevated, Used rails   General bed mobility comments: Increased time. Cues provided as needed.            Balance Overall balance assessment: Needs assistance         Standing balance support: Bilateral upper extremity supported, Reliant on assistive device for balance, During functional activity Standing balance-Leahy Scale: Poor         ADL either performed or assessed with clinical judgement   ADL Overall ADL's : Needs assistance/impaired Eating/Feeding: Sitting;Minimal assistance   Grooming: Sitting;Minimal assistance;Wash/dry face;Brushing hair   Upper Body Bathing: Sitting;Moderate assistance   Lower Body Bathing: Sitting/lateral leans;Minimal assistance   Upper Body Dressing : Sitting;Minimal assistance   Lower Body Dressing: Sitting/lateral leans;Maximal assistance   Toilet Transfer: Moderate assistance Toilet Transfer Details (indicate cue type and reason): taking few steps HOB with increased LOB with HHA Toileting- Clothing Manipulation and Hygiene: Maximal assistance;Sit to/from stand                Pertinent Vitals/Pain Pain Assessment Pain Assessment: No/denies pain     Extremity/Trunk Assessment Upper Extremity Assessment Upper Extremity Assessment: Right hand dominant;Generalized weakness   Lower Extremity Assessment Lower Extremity Assessment: Defer to PT evaluation   Cervical / Trunk Assessment Cervical / Trunk Assessment: Normal      Cognition Arousal: Alert Behavior During Therapy: Flat affect Overall Cognitive Status: Within Functional Limits for tasks assessed                      Home Living   Living Arrangements: Alone Available Help at Discharge: Friend(s);Available PRN/intermittently Type of Home: Apartment Home Access: Stairs to enter  Home Layout: One level               Home Equipment: Clinical biochemist (2 wheels);Grab bars - tub/shower   Additional Comments: 3L/min O2 at home      Prior Functioning/Environment Prior Level of Function : Needs assist             Mobility Comments: uses a walker ADLs Comments: mod ind ADLs. Friends help with transportation, grocery shopping        OT Problem List: Decreased activity tolerance;Impaired balance (sitting and/or standing);Decreased coordination;Decreased safety awareness;Decreased knowledge of precautions;Decreased knowledge of use of DME or AE;Cardiopulmonary status limiting activity      OT Treatment/Interventions: Self-care/ADL training;Therapeutic exercise;DME and/or AE instruction;Patient/family education;Balance training;Therapeutic activities    OT Goals(Current goals can be found in the care plan section) Acute Rehab OT Goals Patient Stated Goal: none stated OT Goal Formulation: With patient Time For Goal Achievement: 07/29/23 Potential to Achieve Goals: Fair  OT Frequency: Min 1X/week       AM-PAC OT "6 Clicks" Daily Activity     Outcome Measure Help from another person eating meals?: A Little Help from another person taking care of personal grooming?: A Little Help from another person toileting, which includes using toliet, bedpan, or urinal?: A Lot Help from another person bathing (including washing, rinsing, drying)?: A Lot Help from another person to put on and taking off regular upper body clothing?: A Little Help from another person to put on and taking off regular lower body clothing?: A Lot 6 Click Score: 15   End of Session Equipment Utilized During Treatment: Gait belt Nurse Communication: Mobility status  Activity Tolerance: Patient tolerated treatment well Patient left: in bed;with call bell/phone within reach;with bed alarm set  OT Visit Diagnosis: Unsteadiness on feet (R26.81);Other abnormalities of gait and mobility (R26.89);History of falling (Z91.81)                Time: 2440-1027 OT Time  Calculation (min): 15 min Charges:  OT General Charges $OT Visit: 1 Visit OT Evaluation $OT Eval Low Complexity: 1 Low  Shone Leventhal OTR/L, MS Acute Rehabilitation Department Office# [phone]   Selinda Flavin 07/15/2023, 12:52 PM

## 2023-07-15 NOTE — TOC Progression Note (Addendum)
Transition of Care [facility]) - Progression Note    Patient Details  Name: Kendra Watts MRN: [id number] Date of Birth: 1945-08-04  [facility] CM/SW Contact  Howell Rucks, RN Phone Number: 07/15/2023, 11:39 AM  Clinical Narrative:  PT eval completed, recommendation for short term rehab/SNF. Met with pt at bedside with sister , [name], on speaker phone, pt questioning recommendation for short term rehab, NCM explained short term rehab recommended due to deconditioning and for strengthening exercises prior to return home, pt confirmed she lives alone. Pt requested time to think about it.PASRR [id number]  TOC will follow up.      Expected Discharge Plan: Skilled Nursing Facility Barriers to Discharge: Continued Medical Work up  Expected Discharge Plan and Services       Living arrangements for the past 2 months: Apartment                                       Social Determinants of Health (SDOH) Interventions SDOH Screenings   Food Insecurity: No Food Insecurity (07/12/2023)  Housing: Low Risk  (07/12/2023)  Transportation Needs: No Transportation Needs (07/12/2023)  Utilities: Not At Risk (07/12/2023)  Alcohol Screen: Low Risk  (06/20/2020)  Depression (PHQ2-9): Low Risk  (06/20/2020)  Financial Resource Strain: Low Risk  (06/20/2020)  Physical Activity: Insufficiently Active (06/20/2020)  Social Connections: Moderately Isolated (06/20/2020)  Stress: No Stress Concern Present (06/20/2020)  Tobacco Use: High Risk (07/11/2023)    Readmission Risk Interventions    07/14/2023   12:45 PM  Readmission Risk Prevention Plan  Transportation Screening Complete  PCP or Specialist Appt within 5-7 Days Complete  Home Care Screening Complete  Medication Review (RN CM) Complete

## 2023-07-15 NOTE — Progress Notes (Signed)
Patient Name: Kendra Watts Date of Encounter: 07/15/2023 [facility] Cardiologist: Nanetta Batty, MD   Interval Summary  .    77 y.o. 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 She denied any chest pain, SOB, or anything that bothers her at this time.    Vital Signs .    Vitals:   07/14/23 1302 07/14/23 2123 07/15/23 0208 07/15/23 0508  BP: (!) 152/69 114/62 129/81 116/70  Pulse: 86 83 89 85  Resp: 20 (!) 26 20 20   Temp: 98.8 F (37.1 C) 98.5 F (36.9 C) 97.8 F (36.6 C) 97.9 F (36.6 C)  TempSrc: Oral Axillary Oral Oral  SpO2: 97% 100% 100% 100%  Weight:      Height:       No intake or output data in the 24 hours ending 07/15/23 0839    07/11/2023   12:14 PM 07/11/2023    1:21 AM 05/20/2023    2:28 PM  Last 3 Weights  Weight (lbs) 87 lb 14.4 oz 80 lb 81 lb  Weight (kg) 39.871 kg 36.288 kg 36.741 kg      Telemetry/ECG    Sinus rhythm 80-90s, frequent PACs, QT not prolonged  - Personally Reviewed  Physical Exam .   GEN: No acute distress.  Frail elderly, cachectic   Neck: No JVD Cardiac: Regular rate, frequent early heart beat, no murmurs, rubs, or gallops.  Respiratory: Clear to auscultation bilaterally. On [place] oxygen  GI: Soft, nontender, non-distended  MS: No leg edema  Assessment & Plan .     NSTEMI - presented with worsening SOB over the last week, found to have  high-sensitivity troponin 2,449 >> 2,615. EKG shows sinus tachycardia with slight ST depression in inferolateral leads and non-specific T wave changes. Chest CTA negative for PE but did show an enlarging solid nodule in the superior segment of the left lower lobe, suspicious for metastatic disease. She denies any chest pain this admission.  - Echo 07/12/23 showed LVEF 55-60%, no RWMA, normal RV, trivial AI.  - Given frailty, memory issues, prior ICH, and enlarging lung nodule concerning for metastatic disease and the fact that she is chest pain free and normal LV systolic function on echo, recommend medical management.   - She has completed 48 hours on heparin drip (neuro was ok for heparin gtt without a bolus given hx of ICH). Continue ASA 81mg , crestor 10mg , and Toprol XL 50mg  daily for medical therapy.  - Follow up arranged with cardiology on 07/28/22   Acute on Chronic HFpEF - Echo in 10/2021 showed LVEF of 55-60% with severe LVH and grade 1 diastolic dysfunction, normal RV, and no significant valvular disease.  - Echo 07/12/23 showed LVEF 55-60%, no RWMA, normal RV, trivial AI.  - BNP elevated at 1,494 on 07/11/23  - s/p IV Lasix 20mg  daily, there is no accurate I&O tacking or weight records, clinically she is euvolemic, oxygen use is at baseline due to COPD, will transition to  PO Lasix 20mg  daily today  - GDMT: continue Toprol XL 50mg  daily, may add SGLT2I outpatient if circumstance allows   PAD - Patient has a long history of PAD with renal artery stenosis (s/p bilateral renal artery stenting), carotid disease (s/p right CEA), and mild lower extremity arterial disease (treated medically) - Continue aspirin and statin. - Can continue to follow as an outpatient.    Hypertension - started Toprolol XL 50mg  daily and lasix 20mg  daily this admission, BP controlled, can continue this regimen   - She is on Cardura 8mg  daily at home but this has not been restarted. May stop at time of discharge    Hyperlipidemia - Continue Crestor 10 mg daily, LDL 45 - LFTs mildly elevated on admission (AST 89, ALT 78), repeat LFT in 4-6 weeks   Hypokalemia - resolved with replacement    Prolonged QTc - QTc >> 511 ms on admission.  - Qtc normal on telemetry - Keep Mag >2 and K >4 - avoid QT prolonging meds    COPD with chronic hypoxic respiratory failure on 3-4 L of O2 at home Lung cancer  Hypothyroidism Type 2 diabetes mellitus Chronic anemia  Seizure disorder History of CVA and ICH  Debility  - per primary team       For questions or updates, please contact [facility] Please consult [email or web address] for contact info under        Signed, Cyndi Bender, NP

## 2023-07-15 NOTE — Progress Notes (Addendum)
PROGRESS NOTE    Kendra Watts  YSA:[id number] DOB: 15-Jun-1946 DOA: 07/11/2023 PCP: Georgianne Fick, MD   Brief Narrative:  77 year old female with past medical history significant for COPD on chronic oxygen, peripheral artery disease with renal artery stenosis s/p renal artery stenting 2011 and 2016, carotid stenosis, bilateral iliac disease as well as chronic heart failure with preserved ejection fraction, hypertension, hypothyroidism, treated lung cancer being admitted with non-STEMI.  Patient presents complaining of shortness of breath associated for the last 2 days.  She denies chest pain.  She uses 3 L of oxygen at home she has chronic cough.  Assessment & Plan:   Principal Problem:   NSTEMI (non-ST elevated myocardial infarction) (HCC) Active Problems:   Acute on chronic diastolic heart failure (HCC)  Non-STEMI:  Troponin was at 2600 and patient presented with dyspnea with some ST depression and nonspecific T wave changes.  Seen by cardiology and was started on heparin drip.  Status post heparin drip for 48 hours.  No further chest pain or shortness of breath.  Continue metoprolol.    Pulmonary nodule:  Enlarging now 1.6 cm superior segment of the left lower lobe.  Follows up with Dr. Delton Coombes, pulmonary as outpatient.   Plan was to repeat another CT chest in 6 months she is due January 8.  Will need to follow-up for pulmonary nodule as outpatient.  Informed the patient and sister about it yesterday on 07/13/2023  Acute on Chronic HFpEF  Improved volume status at this time.  2D echocardiogram from 4/23 with LV ejection fraction of 55 to 60% with diastolic dysfunction.  BNP  Elevated.  Patient receiving IV Lasix.  Has been transitioned to oral Lasix from today.  Essential Hypertension:  on amlodipine and Lopressor. On Cardura at home currently on hold.  Blood pressure seems to be stable.  COPD on chronic 3 L of oxygen:  -Continue Dulera,  Incruse, as needed albuterol.   Currently on 3L of oxygen.  Seizure disorder, with history of stroke:  -Continue Keppra  Acute metabolic encephalopathy; agitation.  Improved at this time has sitter at bedside but appears to be calm.  Hyperlipidemia:  Continue with Crestor  Hypokalemia: Improved after repletion.  Potassium today at 3.8.  Hypothyroidism: Continue Synthroid.   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 -crestor  Prolonged QT: Potassium replenished.  PAD: Extensive PAD history with renal artery stenosis status post bilateral renal artery stenting, carotid disease status post right CEA, mild lower extremity arterial disease treated medically.  Patient was on aspirin and statins as outpatient.  Debility deconditioning.  Will get PT evaluation.  Lives in her apartment.  Went to [facility] in the past.  Plan for skilled nursing facility on discharge.  DVT prophylaxis: Lovenox subcu   Code Status: full code  Family Communication:  Spoke with the patient's sister [name] on 07/13/2023  Disposition Plan: Skilled nursing facility as per PT evaluation.  Medically stable for disposition.  Status is: Inpatient  Remains inpatient appropriate because: Need for skilled nursing facility.  Consultants:  Cardiology   Procedures:  none  Antimicrobials:    Subjective:  Today, patient was seen and examined at bedside.  Patient denies increasing shortness of breath or chest pain.  Lying better in bed.  Inquiring in bed.  Objective: Vitals:   07/14/23 2123 07/15/23 0208 07/15/23 0508 07/15/23 1128  BP: 114/62 129/81 116/70 93/64  Pulse: 83 89 85 87  Resp: (!) 26 20 20 20   Temp: 98.5 F (36.9  C) 97.8 F (36.6 C) 97.9 F (36.6 C) 98.4 F (36.9 C)  TempSrc: Axillary Oral Oral Oral  SpO2: 100% 100% 100% 99%  Weight:      Height:        Intake/Output Summary (Last 24 hours) at 07/15/2023 1208 Last data filed at 07/15/2023 1122 Gross per 24 hour  Intake 240 ml  Output  200 ml  Net 40 ml   Filed Weights   07/11/23 0121 07/11/23 1214  Weight: 36.3 kg 39.9 kg    Physical examination:  Body mass index is 15.09 kg/m.   General: Thinly built, not in obvious distress hard of hearing, on nasal cannula oxygen HENT:   No scleral pallor or icterus noted. Oral mucosa is moist.  Distended neck vein. Chest:  .  Diminished breath sounds bilaterally. No crackles or wheezes.  CVS: S1 &S2 heard. No murmur.  Regular rate and rhythm. Abdomen: Soft, nontender, nondistended.  Bowel sounds are heard.   Extremities: No cyanosis, clubbing  peripheral pulses are palpable. Psych: Alert, awake and Communicative. CNS:  No cranial nerve deficits generalized weakness. Skin: Warm and dry.  No rashes noted.  Data Reviewed: I have personally reviewed following labs and imaging studies  CBC: Recent Labs  Lab 07/11/23 0841 07/12/23 0742 07/13/23 0745 07/14/23 0713  WBC 10.0 9.1 8.3 9.9  NEUTROABS 7.9*  --   --   --   HGB 11.0* 9.9* 9.7* 9.8*  HCT 33.7* 32.7* 32.0* 31.8*  MCV 87.1 91.9 92.5 91.6  PLT 262 238 222 203   Basic Metabolic Panel: Recent Labs  Lab 07/11/23 0841 07/12/23 0742 07/13/23 0745 07/14/23 0713  NA 135 140 134* 133*  K 3.1* 3.2* 3.8 4.2  CL 90* 99 98 97*  CO2 32 31 27 30   GLUCOSE 102* 86 96 108*  BUN 24* 14 13 16   CREATININE 0.83 0.61 0.64 0.58  CALCIUM 9.0 8.2* 8.0* 8.2*  MG  --   --  1.8 2.0   GFR: Estimated Creatinine Clearance: 37.1 mL/min (by C-G formula based on SCr of 0.58 mg/dL). Liver Function Tests: Recent Labs  Lab 07/11/23 0841 07/12/23 0742  AST 89* 62*  ALT 78* 63*  ALKPHOS 84 68  BILITOT 0.7 0.8  PROT 7.7 6.1*  ALBUMIN 4.4 3.5   No results for input(s): "LIPASE", "AMYLASE" in the last 168 hours. No results for input(s): "AMMONIA" in the last 168 hours. Coagulation Profile: Recent Labs  Lab 07/11/23 0841  INR 1.1   Cardiac Enzymes: No results for input(s): "CKTOTAL", "CKMB", "CKMBINDEX", "TROPONINI" in the  last 168 hours. BNP (last 3 results) No results for input(s): "PROBNP" in the last 8760 hours. HbA1C: No results for input(s): "HGBA1C" in the last 72 hours. CBG: No results for input(s): "GLUCAP" in the last 168 hours. Lipid Profile: Recent Labs    07/12/23 1441  CHOL 133  HDL 78  LDLCALC 45  TRIG 52  CHOLHDL 1.7   Thyroid Function Tests: No results for input(s): "TSH", "T4TOTAL", "FREET4", "T3FREE", "THYROIDAB" in the last 72 hours. Anemia Panel: No results for input(s): "VITAMINB12", "FOLATE", "FERRITIN", "TIBC", "IRON", "RETICCTPCT" in the last 72 hours. Sepsis Labs: No results for input(s): "PROCALCITON", "LATICACIDVEN" in the last 168 hours.  Recent Results (from the past 240 hours)  Resp panel by RT-PCR (RSV, Flu A&B, Covid) Anterior Nasal Swab     Status: None   Collection Time: 07/11/23  9:03 AM   Specimen: Anterior Nasal Swab  Result Value Ref Range Status  SARS Coronavirus 2 by RT PCR NEGATIVE NEGATIVE Final    Comment: (NOTE) SARS-CoV-2 target nucleic acids are NOT DETECTED.  The SARS-CoV-2 RNA is generally detectable in upper respiratory specimens during the acute phase of infection. The lowest concentration of SARS-CoV-2 viral copies this assay can detect is 138 copies/mL. A negative result does not preclude SARS-Cov-2 infection and should not be used as the sole basis for treatment or other patient management decisions. A negative result may occur with  improper specimen collection/handling, submission of specimen other than nasopharyngeal swab, presence of viral mutation(s) within the areas targeted by this assay, and inadequate number of viral copies(<138 copies/mL). A negative result must be combined with clinical observations, patient history, and epidemiological information. The expected result is Negative.  Fact Sheet for Patients:  BloggerCourse.com  Fact Sheet for Healthcare Providers:   SeriousBroker.it  This test is no t yet approved or cleared by the [place] FDA and  has been authorized for detection and/or diagnosis of SARS-CoV-2 by FDA under an Emergency Use Authorization (EUA). This EUA will remain  in effect (meaning this test can be used) for the duration of the COVID-19 declaration under Section 564(b)(1) of the Act, 21 U.S.C.section 360bbb-3(b)(1), unless the authorization is terminated  or revoked sooner.       Influenza A by PCR NEGATIVE NEGATIVE Final   Influenza B by PCR NEGATIVE NEGATIVE Final    Comment: (NOTE) The Xpert Xpress SARS-CoV-2/FLU/RSV plus assay is intended as an aid in the diagnosis of influenza from Nasopharyngeal swab specimens and should not be used as a sole basis for treatment. Nasal washings and aspirates are unacceptable for Xpert Xpress SARS-CoV-2/FLU/RSV testing.  Fact Sheet for Patients: BloggerCourse.com  Fact Sheet for Healthcare Providers: SeriousBroker.it  This test is not yet approved or cleared by the [place] FDA and has been authorized for detection and/or diagnosis of SARS-CoV-2 by FDA under an Emergency Use Authorization (EUA). This EUA will remain in effect (meaning this test can be used) for the duration of the COVID-19 declaration under Section 564(b)(1) of the Act, 21 U.S.C. section 360bbb-3(b)(1), unless the authorization is terminated or revoked.     Resp Syncytial Virus by PCR NEGATIVE NEGATIVE Final    Comment: (NOTE) Fact Sheet for Patients: BloggerCourse.com  Fact Sheet for Healthcare Providers: SeriousBroker.it  This test is not yet approved or cleared by the [place] FDA and has been authorized for detection and/or diagnosis of SARS-CoV-2 by FDA under an Emergency Use Authorization (EUA). This EUA will remain in effect (meaning this test can be used) for  the duration of the COVID-19 declaration under Section 564(b)(1) of the Act, 21 U.S.C. section 360bbb-3(b)(1), unless the authorization is terminated or revoked.  Performed at [facility], [address], [place], [place] [address]      Radiology Studies: No results found.    Scheduled Meds:  aspirin EC  81 mg Oral Daily   enoxaparin (LOVENOX) injection  30 mg Subcutaneous Q24H   furosemide  20 mg Oral Daily   levETIRAcetam  500 mg Oral BID   levothyroxine  88 mcg Oral QAC breakfast   LORazepam  1 mg Intramuscular Once   metoprolol succinate  50 mg Oral Daily   mometasone-formoterol  2 puff Inhalation BID   And   umeclidinium bromide  1 puff Inhalation Daily   pantoprazole  40 mg Oral Daily   rosuvastatin  10 mg Oral Daily   Continuous Infusions:     LOS:  4 days   Joycelyn Das, MD [facility] If 7PM-7AM, please contact night-coverage [email or web address]  07/15/2023, 12:08 PM

## 2023-07-16 DIAGNOSIS — I214 Non-ST elevation (NSTEMI) myocardial infarction: Secondary | ICD-10-CM | POA: Diagnosis not present

## 2023-07-16 LAB — BASIC METABOLIC PANEL
Anion gap: 5 (ref 5–15)
BUN: 20 mg/dL (ref 8–23)
CO2: 34 mmol/L — ABNORMAL HIGH (ref 22–32)
Calcium: 8.6 mg/dL — ABNORMAL LOW (ref 8.9–10.3)
Chloride: 98 mmol/L (ref 98–111)
Creatinine, Ser: 0.85 mg/dL (ref 0.44–1.00)
GFR, Estimated: >60 mL/min (ref >60)
Glucose, Bld: 105 mg/dL — ABNORMAL HIGH (ref 70–99)
Potassium: 3.8 mmol/L (ref 3.5–5.1)
Sodium: 137 mmol/L (ref 135–145)

## 2023-07-16 NOTE — Progress Notes (Signed)
PROGRESS NOTE    Kendra Watts  LKG:[id number] DOB: Aug 07, 1945 DOA: 07/11/2023 PCP: Georgianne Fick, MD   Brief Narrative:  77 year old female with past medical history significant for COPD on chronic oxygen, peripheral artery disease with renal artery stenosis s/p renal artery stenting 2011 and 2016, carotid stenosis, bilateral iliac disease as well as chronic heart failure with preserved ejection fraction, hypertension, hypothyroidism, treated lung cancer being admitted with non-STEMI.  Patient presents complaining of shortness of breath associated for the last 2 days.  She denies chest pain.  She uses 3 L of oxygen at home she has chronic cough.  At this time, patient is stable for disposition home.  Assessment & Plan:   Principal Problem:   NSTEMI (non-ST elevated myocardial infarction) (HCC) Active Problems:   Acute on chronic diastolic heart failure (HCC)  Non-STEMI:  Troponin was at 2600 and patient presented with dyspnea with some ST depression and nonspecific T wave changes.  Seen by cardiology and was started on heparin drip.  Status post heparin drip for 48 hours.  No further chest pain or shortness of breath.  Continue metoprolol.  Remains stable at this time.  Pulmonary nodule:  Enlarging now 1.6 cm superior segment of the left lower lobe.  Follows up with Dr. Delton Coombes, pulmonary as outpatient.   Plan was to repeat another CT chest in 6 months she is due January 8.  Will need to follow-up for pulmonary nodule as outpatient.  Informed the patient and sister about it yesterday on 07/13/2023  Acute on Chronic HFpEF  Improved volume status at this time.  2D echocardiogram from 4/23 with LV ejection fraction of 55 to 60% with diastolic dysfunction.  BNP  Elevated.  Received IV Lasix during hospitalization and has been transitioned to oral Lasix since 07/15/2023  Essential Hypertension:  on amlodipine and Lopressor. On Cardura at home currently on hold.  Blood pressure seems to  be stable.  COPD on chronic 3 L of oxygen:  -Continue Dulera,  Incruse, as needed albuterol.  Currently on 2L of oxygen.  Seizure disorder, with history of stroke:  -Continue Keppra  Acute metabolic encephalopathy; agitation.  Resolved.  Oriented and calm at this time.  Hyperlipidemia:  Continue with Crestor  Hypokalemia: Improved after repletion.  Latest potassium of 3.8  Hypothyroidism: Continue Synthroid.   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 Continue crestor  Prolonged QT: Potassium replenished.  Latest potassium 3.8 with magnesium of 2.0.  PAD: Extensive PAD history with renal artery stenosis status post bilateral renal artery stenting, carotid disease status post right CEA, mild lower extremity arterial disease treated medically.  Patient was on aspirin and statins as outpatient.  Debility deconditioning.  Will get PT evaluation.  Lives in her apartment.  Went to [facility] in the past.  Plan for skilled nursing facility on discharge.  DVT prophylaxis: Lovenox subcu   Code Status: full code  Family Communication:  Spoke with the patient's sister Kendra Watts on 07/13/2023  Disposition Plan: Skilled nursing facility as per PT evaluation.  Medically stable for disposition.  Status is: Inpatient  Remains inpatient appropriate because: Need for skilled nursing facility.  Consultants:  Cardiology   Procedures:  none  Antimicrobials:    Subjective:  Today, patient was seen and examined at bedside.  Patient denies interval complaints.  Denies any shortness of breath, chest pain, dizziness lightheadedness.   Objective: Vitals:   07/16/23 0400 07/16/23 0520 07/16/23 0736 07/16/23 1022  BP:  124/77  101/75  Pulse:  72  80  Resp: (!) 24 14    Temp:  (!) 97.4 F (36.3 C)    TempSrc:  Oral    SpO2:  99% 93%   Weight:      Height:        Intake/Output Summary (Last 24 hours) at 07/16/2023 1041 Last data filed at 07/15/2023  2300 Gross per 24 hour  Intake 720 ml  Output 400 ml  Net 320 ml   Filed Weights   07/11/23 0121 07/11/23 1214  Weight: 36.3 kg 39.9 kg    Physical examination:  Body mass index is 15.09 kg/m.   General: Thinly built, not in obvious distress hard of hearing, on nasal cannula oxygen 2 L/min appears weak and deconditioned HENT:   No scleral pallor or icterus noted. Oral mucosa is moist.  Distended neck vein. Chest:  .  Diminished breath sounds bilaterally. No crackles or wheezes.  CVS: S1 &S2 heard. No murmur.  Regular rate and rhythm. Abdomen: Soft, nontender, nondistended.  Bowel sounds are heard.   Extremities: No cyanosis, clubbing  peripheral pulses are palpable. Psych: Alert, awake and Communicative. CNS:  No cranial nerve deficits, generalized weakness noted Skin: Warm and dry.  No rashes noted.  Data Reviewed: I have personally reviewed following labs and imaging studies  CBC: Recent Labs  Lab 07/11/23 0841 07/12/23 0742 07/13/23 0745 07/14/23 0713  WBC 10.0 9.1 8.3 9.9  NEUTROABS 7.9*  --   --   --   HGB 11.0* 9.9* 9.7* 9.8*  HCT 33.7* 32.7* 32.0* 31.8*  MCV 87.1 91.9 92.5 91.6  PLT 262 238 222 203   Basic Metabolic Panel: Recent Labs  Lab 07/11/23 0841 07/12/23 0742 07/13/23 0745 07/14/23 0713 07/16/23 0336  NA 135 140 134* 133* 137  K 3.1* 3.2* 3.8 4.2 3.8  CL 90* 99 98 97* 98  CO2 32 31 27 30  34*  GLUCOSE 102* 86 96 108* 105*  BUN 24* 14 13 16 20   CREATININE 0.83 0.61 0.64 0.58 0.85  CALCIUM 9.0 8.2* 8.0* 8.2* 8.6*  MG  --   --  1.8 2.0  --    GFR: Estimated Creatinine Clearance: 34.9 mL/min (by C-G formula based on SCr of 0.85 mg/dL). Liver Function Tests: Recent Labs  Lab 07/11/23 0841 07/12/23 0742  AST 89* 62*  ALT 78* 63*  ALKPHOS 84 68  BILITOT 0.7 0.8  PROT 7.7 6.1*  ALBUMIN 4.4 3.5   No results for input(s): "LIPASE", "AMYLASE" in the last 168 hours. No results for input(s): "AMMONIA" in the last 168 hours. Coagulation  Profile: Recent Labs  Lab 07/11/23 0841  INR 1.1   Cardiac Enzymes: No results for input(s): "CKTOTAL", "CKMB", "CKMBINDEX", "TROPONINI" in the last 168 hours. BNP (last 3 results) No results for input(s): "PROBNP" in the last 8760 hours. HbA1C: No results for input(s): "HGBA1C" in the last 72 hours. CBG: No results for input(s): "GLUCAP" in the last 168 hours. Lipid Profile: No results for input(s): "CHOL", "HDL", "LDLCALC", "TRIG", "CHOLHDL", "LDLDIRECT" in the last 72 hours.  Thyroid Function Tests: No results for input(s): "TSH", "T4TOTAL", "FREET4", "T3FREE", "THYROIDAB" in the last 72 hours. Anemia Panel: No results for input(s): "VITAMINB12", "FOLATE", "FERRITIN", "TIBC", "IRON", "RETICCTPCT" in the last 72 hours. Sepsis Labs: No results for input(s): "PROCALCITON", "LATICACIDVEN" in the last 168 hours.  Recent Results (from the past 240 hours)  Resp panel by RT-PCR (RSV, Flu A&B, Covid) Anterior Nasal Swab     Status: None  Collection Time: 07/11/23  9:03 AM   Specimen: Anterior Nasal Swab  Result Value Ref Range Status   SARS Coronavirus 2 by RT PCR NEGATIVE NEGATIVE Final    Comment: (NOTE) SARS-CoV-2 target nucleic acids are NOT DETECTED.  The SARS-CoV-2 RNA is generally detectable in upper respiratory specimens during the acute phase of infection. The lowest concentration of SARS-CoV-2 viral copies this assay can detect is 138 copies/mL. A negative result does not preclude SARS-Cov-2 infection and should not be used as the sole basis for treatment or other patient management decisions. A negative result may occur with  improper specimen collection/handling, submission of specimen other than nasopharyngeal swab, presence of viral mutation(s) within the areas targeted by this assay, and inadequate number of viral copies(<138 copies/mL). A negative result must be combined with clinical observations, patient history, and epidemiological information. The expected  result is Negative.  Fact Sheet for Patients:  BloggerCourse.com  Fact Sheet for Healthcare Providers:  SeriousBroker.it  This test is no t yet approved or cleared by the [place] FDA and  has been authorized for detection and/or diagnosis of SARS-CoV-2 by FDA under an Emergency Use Authorization (EUA). This EUA will remain  in effect (meaning this test can be used) for the duration of the COVID-19 declaration under Section 564(b)(1) of the Act, 21 U.S.C.section 360bbb-3(b)(1), unless the authorization is terminated  or revoked sooner.       Influenza A by PCR NEGATIVE NEGATIVE Final   Influenza B by PCR NEGATIVE NEGATIVE Final    Comment: (NOTE) The Xpert Xpress SARS-CoV-2/FLU/RSV plus assay is intended as an aid in the diagnosis of influenza from Nasopharyngeal swab specimens and should not be used as a sole basis for treatment. Nasal washings and aspirates are unacceptable for Xpert Xpress SARS-CoV-2/FLU/RSV testing.  Fact Sheet for Patients: BloggerCourse.com  Fact Sheet for Healthcare Providers: SeriousBroker.it  This test is not yet approved or cleared by the [place] FDA and has been authorized for detection and/or diagnosis of SARS-CoV-2 by FDA under an Emergency Use Authorization (EUA). This EUA will remain in effect (meaning this test can be used) for the duration of the COVID-19 declaration under Section 564(b)(1) of the Act, 21 U.S.C. section 360bbb-3(b)(1), unless the authorization is terminated or revoked.     Resp Syncytial Virus by PCR NEGATIVE NEGATIVE Final    Comment: (NOTE) Fact Sheet for Patients: BloggerCourse.com  Fact Sheet for Healthcare Providers: SeriousBroker.it  This test is not yet approved or cleared by the [place] FDA and has been authorized for detection and/or diagnosis of  SARS-CoV-2 by FDA under an Emergency Use Authorization (EUA). This EUA will remain in effect (meaning this test can be used) for the duration of the COVID-19 declaration under Section 564(b)(1) of the Act, 21 U.S.C. section 360bbb-3(b)(1), unless the authorization is terminated or revoked.  Performed at [facility], [address], [place], [place] [address]      Radiology Studies: No results found.    Scheduled Meds:  aspirin EC  81 mg Oral Daily   enoxaparin (LOVENOX) injection  30 mg Subcutaneous Q24H   furosemide  20 mg Oral Daily   levETIRAcetam  500 mg Oral BID   levothyroxine  88 mcg Oral QAC breakfast   LORazepam  1 mg Intramuscular Once   metoprolol succinate  50 mg Oral Daily   mometasone-formoterol  2 puff Inhalation BID   And   umeclidinium bromide  1 puff Inhalation Daily   pantoprazole  40  mg Oral Daily   rosuvastatin  10 mg Oral Daily   Continuous Infusions:     LOS: 5 days   Joycelyn Das, MD [facility] If 7PM-7AM, please contact night-coverage [email or web address]  07/16/2023, 10:41 AM

## 2023-07-17 DIAGNOSIS — I214 Non-ST elevation (NSTEMI) myocardial infarction: Secondary | ICD-10-CM | POA: Diagnosis not present

## 2023-07-17 NOTE — Progress Notes (Signed)
Physical Therapy Treatment Patient Details Name: Kendra Watts MRN: [id number] DOB: 29-May-1946 Today's Date: 07/17/2023   History of Present Illness patient is a 77 year old female admitted with NSTEMI, increased troponin level. Hx of lung Ca with liver mets, chronic resp failure-O2 dep, CHF, L hip fx, breast Ca, COPD, ICH, Sz, CVA, renal aretery stenosis, carotid stenosis    PT Comments  Pt agreeable to mobilize and ambulated in hallway however requested return to bed (apparently does not find recliner comfortable).  Pt would like to return home upon d/c however lives alone.  Recommend assist as below if pt d/c home however feel pt would benefit from continued inpatient follow up therapy, <3 hours/day.     If plan is discharge home, recommend the following: A little help with walking and/or transfers;A little help with bathing/dressing/bathroom;Assistance with cooking/housework;Assist for transportation;Help with stairs or ramp for entrance   Can travel by private vehicle     Yes  Equipment Recommendations  None recommended by PT    Recommendations for Other Services       Precautions / Restrictions Precautions Precautions: Fall Precaution Comments: O2 dep     Mobility  Bed Mobility Overal bed mobility: Needs Assistance Bed Mobility: Supine to Sit, Sit to Supine     Supine to sit: Supervision Sit to supine: Supervision        Transfers Overall transfer level: Needs assistance Equipment used: Rolling walker (2 wheels) Transfers: Sit to/from Stand Sit to Stand: Contact guard assist           General transfer comment: cues for safe technique, CGA for safety    Ambulation/Gait Ambulation/Gait assistance: Contact guard assist, Min assist Gait Distance (Feet): 100 Feet Assistive device: Rolling walker (2 wheels) Gait Pattern/deviations: Step-through pattern, Decreased stride length       General Gait Details: verbal cues for RW positioning, remained on 3L  O2NC (reported baseline) SPO2 94% upon returning to room   Stairs             Wheelchair Mobility     Tilt Bed    Modified Rankin (Stroke Patients Only)       Balance Overall balance assessment: Needs assistance         Standing balance support: Bilateral upper extremity supported, Reliant on assistive device for balance, During functional activity Standing balance-Leahy Scale: Poor                              Cognition Arousal: Alert Behavior During Therapy: WFL for tasks assessed/performed Overall Cognitive Status: Within Functional Limits for tasks assessed                                          Exercises      General Comments        Pertinent Vitals/Pain Pain Assessment Pain Assessment: No/denies pain    Home Living                          Prior Function            PT Goals (current goals can now be found in the care plan section) Progress towards PT goals: Progressing toward goals    Frequency    Min 1X/week      PT Plan  Co-evaluation              AM-PAC PT "6 Clicks" Mobility   Outcome Measure  Help needed turning from your back to your side while in a flat bed without using bedrails?: A Little Help needed moving from lying on your back to sitting on the side of a flat bed without using bedrails?: A Little Help needed moving to and from a bed to a chair (including a wheelchair)?: A Little Help needed standing up from a chair using your arms (e.g., wheelchair or bedside chair)?: A Little Help needed to walk in hospital room?: A Little Help needed climbing 3-5 steps with a railing? : A Lot 6 Click Score: 17    End of Session Equipment Utilized During Treatment: Gait belt;Oxygen Activity Tolerance: Patient tolerated treatment well Patient left: in bed;with call bell/phone within reach;with bed alarm set Nurse Communication: Mobility status PT Visit Diagnosis: Muscle weakness  (generalized) (M62.81)     Time: 1050-1104 PT Time Calculation (min) (ACUTE ONLY): 14 min  Charges:    $Gait Training: 8-22 mins PT General Charges $$ ACUTE PT VISIT: 1 Visit                    Thomasene Mohair PT, DPT Physical Therapist Acute Rehabilitation Services Office: [phone]    Kati L Payson 07/17/2023, 1:28 PM

## 2023-07-17 NOTE — TOC Progression Note (Addendum)
Transition of Care [facility]) - Progression Note    Patient Details  Name: Kendra Watts MRN: [id number] Date of Birth: [birth year]  Transition of Care [facility]) CM/SW Contact  Larrie Kass, LCSW Phone Number: 07/17/2023, 11:54 AM  Clinical Narrative:     CSW spoke with the patient regarding the discharge plan. The patient is very hesitant about going to rehab. CSW explained to the patient that it would be safer to discharge to a facility for rehab to build her strength before discharging home. The patient is still unable to make a decision but agreed to have her information sent out to see which facilities can offer her a bed. The patient would like to review the options before making a decision. CSW will fax the patient's information for placement. TOC to follow.   Adden  4:00pm  CSW presented bed offers, but the patient has declined SNF placement. CSW encouraged the patient to accept home health services, but the patient declined home health. The patient will need a portable oxygen tank for transport home. The patient reported receiving her O2 from [facility] and stated she will need transportation assistance at the time of discharge.TOC to follow.   Expected Discharge Plan: Skilled Nursing Facility Barriers to Discharge: Continued Medical Work up  Expected Discharge Plan and Services       Living arrangements for the past 2 months: Apartment                                       Social Determinants of Health (SDOH) Interventions SDOH Screenings   Food Insecurity: No Food Insecurity (07/12/2023)  Housing: Low Risk  (07/12/2023)  Transportation Needs: No Transportation Needs (07/12/2023)  Utilities: Not At Risk (07/12/2023)  Alcohol Screen: Low Risk  (06/20/2020)  Depression (PHQ2-9): Low Risk  (06/20/2020)  Financial Resource Strain: Low Risk  (06/20/2020)  Physical Activity: Insufficiently Active (06/20/2020)  Social Connections: Moderately Isolated  (06/20/2020)  Stress: No Stress Concern Present (06/20/2020)  Tobacco Use: High Risk (07/11/2023)    Readmission Risk Interventions    07/14/2023   12:45 PM  Readmission Risk Prevention Plan  Transportation Screening Complete  PCP or Specialist Appt within 5-7 Days Complete  Home Care Screening Complete  Medication Review (RN CM) Complete

## 2023-07-17 NOTE — Plan of Care (Signed)
  Problem: Activity: Goal: Risk for activity intolerance will decrease Outcome: Progressing   Problem: Nutrition: Goal: Adequate nutrition will be maintained Outcome: Progressing   Problem: Coping: Goal: Level of anxiety will decrease Outcome: Progressing   Problem: Skin Integrity: Goal: Risk for impaired skin integrity will decrease Outcome: Progressing   

## 2023-07-17 NOTE — Plan of Care (Signed)
  Problem: Health Behavior/Discharge Planning: Goal: Ability to manage health-related needs will improve Outcome: Progressing   Problem: Clinical Measurements: Goal: Ability to maintain clinical measurements within normal limits will improve Outcome: Progressing Goal: Cardiovascular complication will be avoided Outcome: Progressing   Problem: Safety: Goal: Non-violent Restraint(s) Outcome: Progressing

## 2023-07-17 NOTE — NC FL2 (Signed)
[place] MEDICAID FL2 LEVEL OF CARE FORM     IDENTIFICATION  Patient Name: Kendra Watts Birthdate: 01-14-1946 Sex: female Admission Date (Current Location): 07/11/2023  [facility] and [place] Number:  Producer, television/film/video and Address:  [facility],  [address] [place] [address]      Provider Number: [id number]  Attending Physician Name and Address:  Joycelyn Das, MD  Relative Name and Phone Number:       Current Level of Care: Hospital Recommended Level of Care: Skilled Nursing Facility Prior Approval Number:    Date Approved/Denied:   PASRR Number: [id number]  Discharge Plan: SNF    Current Diagnoses: Patient Active Problem List   Diagnosis Date Noted   NSTEMI (non-ST elevated myocardial infarction) (HCC) 07/11/2023   Acute cystitis 11/22/2021   Delirium due to multiple etiologies, acute, hyperactive 11/21/2021   Acute metabolic encephalopathy 11/21/2021   Hypothyroidism, unspecified 11/21/2021   Purulent bronchitis (HCC) 11/16/2021   Urinary retention 11/16/2021   DNR (do not resuscitate) 11/16/2021   Intracerebral hemorrhage 11/13/2021   Protein-calorie malnutrition, severe 11/13/2021   Status epilepticus (HCC)    Acute on chronic diastolic heart failure (HCC) 05/23/2020   COPD with acute exacerbation (HCC) 05/23/2020   Acute on chronic respiratory failure with hypoxia (HCC) 05/23/2020   Multiple closed fractures of ribs of left side    Fall    Palliative care by specialist    Goals of care, counseling/discussion    Advanced directives, counseling/discussion    Generalized weakness    Fracture of one rib, left side, initial encounter for closed fracture 04/08/2020   Fracture of greater trochanter of left femur (HCC) 04/08/2020   Closed fracture of greater trochanter of left femur (HCC) 04/08/2020   Epistaxis 03/13/2020   Acute blood loss anemia 03/13/2020   Current smoker    Hypertension, uncontrolled    Platelet dysfunction due  to aspirin (HCC)    Hemoptysis 09/14/2018   Squamous cell lung cancer, left (HCC) 09/10/2018   Adenocarcinoma, lung, 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   Multiple lung nodules 07/20/2018   Chronic obstructive pulmonary disease (HCC) 07/20/2018   Acute CHF (congestive heart failure) (HCC) 07/06/2018   Chronic respiratory failure with hypoxia (HCC) 07/06/2018   Hyponatremia 07/06/2018   Anemia 07/06/2018   Breast cancer of upper-outer quadrant of left female breast (HCC) 09/08/2015   Renal artery stenosis (HCC) 08/23/2014   Right upper extremity numbness 07/01/2013   Atherosclerotic renal artery stenosis, bilateral (HCC) 05/26/2013   Peripheral arterial disease (HCC) 05/26/2013   Essential hypertension 05/26/2013   Hyperlipidemia 05/26/2013   Tobacco abuse 05/26/2013   Carotid artery stenosis 03/09/2012    Orientation RESPIRATION BLADDER Height & Weight     Self, Time, Situation, Place  O2 (3L) Continent Weight: 87 lb 14.4 oz (39.9 kg) Height:  5\' 4"  (162.6 cm)  BEHAVIORAL SYMPTOMS/MOOD NEUROLOGICAL BOWEL NUTRITION STATUS      Continent Diet (HEart)  AMBULATORY STATUS COMMUNICATION OF NEEDS Skin   Limited Assist Verbally Normal                       Personal Care Assistance Level of Assistance  Bathing, Feeding, Dressing Bathing Assistance: Limited assistance Feeding assistance: Independent Dressing Assistance: Limited assistance     Functional Limitations Info  Sight, Hearing, Speech Sight Info: Impaired (Glasses) Hearing Info: Adequate Speech Info: Adequate    SPECIAL CARE FACTORS FREQUENCY  PT (By licensed PT), OT (By licensed OT)  PT Frequency: 5 x a week OT Frequency: 5 x a week            Contractures Contractures Info: Not present    Additional Factors Info  Code Status, Allergies Code Status Info: full Allergies Info: NKA           Current Medications (07/17/2023):  This is the current hospital active medication list Current Facility-Administered  Medications  Medication Dose Route Frequency Provider Last Rate Last Admin   acetaminophen (TYLENOL) tablet 650 mg  650 mg Oral Q6H PRN Kirby Crigler, Mir M, MD       Or   acetaminophen (TYLENOL) suppository 650 mg  650 mg Rectal Q6H PRN Kirby Crigler, Mir M, MD       albuterol (PROVENTIL) (2.5 MG/3ML) 0.083% nebulizer solution 2.5 mg  2.5 mg Nebulization Q2H PRN Kirby Crigler, Mir M, MD       aspirin EC tablet 81 mg  81 mg Oral Daily Nahser, Deloris Ping, MD   81 mg at 07/17/23 0956   enoxaparin (LOVENOX) injection 30 mg  30 mg Subcutaneous Q24H Pokhrel, Laxman, MD   30 mg at 07/16/23 2137   furosemide (LASIX) tablet 20 mg  20 mg Oral Daily Cyndi Bender, NP   20 mg at 07/17/23 0955   levETIRAcetam (KEPPRA) tablet 500 mg  500 mg Oral BID Kirby Crigler, Mir M, MD   500 mg at 07/17/23 5366   levothyroxine (SYNTHROID) tablet 88 mcg  88 mcg Oral QAC breakfast Regalado, Belkys A, MD   88 mcg at 07/17/23 0610   LORazepam (ATIVAN) injection 1 mg  1 mg Intravenous Q6H PRN Regalado, Belkys A, MD   1 mg at 07/16/23 0211   LORazepam (ATIVAN) injection 1 mg  1 mg Intramuscular Once Regalado, Belkys A, MD       metoprolol succinate (TOPROL-XL) 24 hr tablet 50 mg  50 mg Oral Daily Maisie Fus, MD   50 mg at 07/17/23 0956   mometasone-formoterol (DULERA) 100-5 MCG/ACT inhaler 2 puff  2 puff Inhalation BID Kirby Crigler, Mir M, MD   2 puff at 07/17/23 4403   And   umeclidinium bromide (INCRUSE ELLIPTA) 62.5 MCG/ACT 1 puff  1 puff Inhalation Daily Kirby Crigler, Mir M, MD   1 puff at 07/17/23 0905   morphine (PF) 2 MG/ML injection 2 mg  2 mg Intravenous Q2H PRN Kirby Crigler, Mir M, MD       pantoprazole (PROTONIX) EC tablet 40 mg  40 mg Oral Daily Kirby Crigler, Mir M, MD   40 mg at 07/17/23 0956   rosuvastatin (CRESTOR) tablet 10 mg  10 mg Oral Daily Little Ishikawa, MD   10 mg at 07/17/23 4742     Discharge Medications: Please see discharge summary for a list of discharge medications.  Relevant Imaging Results:  Relevant  Lab Results:   Additional Information [id number]  Valentina Shaggy Phillis Thackeray, LCSW

## 2023-07-17 NOTE — Progress Notes (Signed)
PROGRESS NOTE    BISHOP HILLIGOSS  MWU:[id number] DOB: [birth year] DOA: 07/11/2023 PCP: Georgianne Fick, MD   Brief Narrative:  77 year old female with past medical history significant for COPD on chronic oxygen, peripheral artery disease with renal artery stenosis s/p renal artery stenting 2011 and 2016, carotid stenosis, bilateral iliac disease as well as chronic heart failure with preserved ejection fraction, hypertension, hypothyroidism, treated lung cancer being admitted with non-STEMI.  Patient presents complaining of shortness of breath associated for the last 2 days.  She denies chest pain.  She uses 3 L of oxygen at home she has chronic cough.  At this time, patient is stable for disposition to skilled nursing facility  Assessment & Plan:   Principal Problem:   NSTEMI (non-ST elevated myocardial infarction) [facility]) Active Problems:   Acute on chronic diastolic heart failure (HCC)  Non-STEMI:  Troponin was at 2600 and patient presented with dyspnea with some ST depression and nonspecific T wave changes.  Seen by cardiology and was started on heparin drip.  Status post heparin drip for 48 hours.  No further chest pain or shortness of breath.  Continue metoprolol.  Remains stable at this time.  Pulmonary nodule:  Enlarging now 1.6 cm superior segment of the left lower lobe.  Follows up with Dr. Delton Coombes, pulmonary as outpatient.   Plan was to repeat another CT chest in 6 months she is due January 8.  Will need to follow-up for pulmonary nodule as outpatient.  Informed the patient and sister about it yesterday on 07/13/2023  Acute on Chronic HFpEF  Improved volume status at this time.  2D echocardiogram from 4/23 with LV ejection fraction of 55 to 60% with diastolic dysfunction.  BNP  Elevated.  Received IV Lasix during hospitalization and has been transitioned to oral Lasix since 07/15/2023.  Has been compensated  Essential Hypertension:  on amlodipine and Lopressor. On Cardura at home  currently on hold.  Blood pressure seems to be stable.  Latest blood pressure of 116/74  COPD on chronic 3 L of oxygen:  -Continue Dulera,  Incruse, as needed albuterol.  Currently on 3L of oxygen.  Seizure disorder, with history of stroke:  -Continue Keppra  Acute metabolic encephalopathy; agitation.  Resolved.  Oriented and calm at this time.  Hyperlipidemia:  Continue with Crestor  Hypokalemia: Improved after repletion.  Latest potassium of 3.8  Hypothyroidism: Continue Synthroid.   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 Continue crestor  Prolonged QT: Potassium replenished.  Latest potassium 3.8 with magnesium of 2.0.  PAD: Extensive PAD history with renal artery stenosis status post bilateral renal artery stenting, carotid disease status post right CEA, mild lower extremity arterial disease treated medically.  Patient was on aspirin and statins as outpatient.  Debility deconditioning.  Patient has been seen by physical therapy and recommend likely discharge with   DVT prophylaxis: Lovenox subcu   Code Status: full code  Family Communication:  Spoke with the patient's sister [name] on 07/13/2023  Disposition Plan: Skilled nursing facility as per PT evaluation.  Medically stable for disposition.  Status is: Inpatient  Remains inpatient appropriate because: Need for skilled nursing facility.  Consultants:  Cardiology   Procedures:  none  Antimicrobials:    Subjective:  Today, patient was seen and examined at bedside.  Patient denies any interval complaints.  Denies any shortness of breath, chest pain, dizziness, lightheadedness.  No nausea vomiting or abdominal pain. Objective: Vitals:   07/16/23 1151 07/16/23 2023 07/17/23 0455 07/17/23 7253  BP: 102/65 120/78 115/74   Pulse: 71 85 74   Resp: (!) 8 19 18    Temp: 98.5 F (36.9 C) 97.9 F (36.6 C) 97.8 F (36.6 C)   TempSrc: Oral Oral Oral   SpO2: 98% 96% 100% 99%  Weight:       Height:        Intake/Output Summary (Last 24 hours) at 07/17/2023 1053 Last data filed at 07/17/2023 1051 Gross per 24 hour  Intake 320 ml  Output 600 ml  Net -280 ml   Filed Weights   07/11/23 0121 07/11/23 1214  Weight: 36.3 kg 39.9 kg    Physical examination:  Body mass index is 15.09 kg/m.   General: Thinly built, not in obvious distress hard of hearing, on nasal cannula oxygen 2 L/min appears weak and deconditioned HENT:   No scleral pallor or icterus noted. Oral mucosa is moist.  Distended neck vein. Chest:  .  Diminished breath sounds bilaterally.  Nontender on palpation. CVS: S1 &S2 heard. No murmur.  Regular rate and rhythm. Abdomen: Soft, nontender, nondistended.  Bowel sounds are heard.   Extremities: No cyanosis, clubbing  peripheral pulses are palpable. Psych: Alert, awake and oriented CNS:  No cranial nerve deficits, generalized weakness noted Skin: Warm and dry.  No rashes noted.  Data Reviewed: I have personally reviewed following labs and imaging studies  CBC: Recent Labs  Lab 07/11/23 0841 07/12/23 0742 07/13/23 0745 07/14/23 0713  WBC 10.0 9.1 8.3 9.9  NEUTROABS 7.9*  --   --   --   HGB 11.0* 9.9* 9.7* 9.8*  HCT 33.7* 32.7* 32.0* 31.8*  MCV 87.1 91.9 92.5 91.6  PLT 262 238 222 203   Basic Metabolic Panel: Recent Labs  Lab 07/11/23 0841 07/12/23 0742 07/13/23 0745 07/14/23 0713 07/16/23 0336  NA 135 140 134* 133* 137  K 3.1* 3.2* 3.8 4.2 3.8  CL 90* 99 98 97* 98  CO2 32 31 27 30  34*  GLUCOSE 102* 86 96 108* 105*  BUN 24* 14 13 16 20   CREATININE 0.83 0.61 0.64 0.58 0.85  CALCIUM 9.0 8.2* 8.0* 8.2* 8.6*  MG  --   --  1.8 2.0  --    GFR: Estimated Creatinine Clearance: 34.9 mL/min (by C-G formula based on SCr of 0.85 mg/dL). Liver Function Tests: Recent Labs  Lab 07/11/23 0841 07/12/23 0742  AST 89* 62*  ALT 78* 63*  ALKPHOS 84 68  BILITOT 0.7 0.8  PROT 7.7 6.1*  ALBUMIN 4.4 3.5   No results for input(s): "LIPASE",  "AMYLASE" in the last 168 hours. No results for input(s): "AMMONIA" in the last 168 hours. Coagulation Profile: Recent Labs  Lab 07/11/23 0841  INR 1.1   Cardiac Enzymes: No results for input(s): "CKTOTAL", "CKMB", "CKMBINDEX", "TROPONINI" in the last 168 hours. BNP (last 3 results) No results for input(s): "PROBNP" in the last 8760 hours. HbA1C: No results for input(s): "HGBA1C" in the last 72 hours. CBG: No results for input(s): "GLUCAP" in the last 168 hours. Lipid Profile: No results for input(s): "CHOL", "HDL", "LDLCALC", "TRIG", "CHOLHDL", "LDLDIRECT" in the last 72 hours.  Thyroid Function Tests: No results for input(s): "TSH", "T4TOTAL", "FREET4", "T3FREE", "THYROIDAB" in the last 72 hours. Anemia Panel: No results for input(s): "VITAMINB12", "FOLATE", "FERRITIN", "TIBC", "IRON", "RETICCTPCT" in the last 72 hours. Sepsis Labs: No results for input(s): "PROCALCITON", "LATICACIDVEN" in the last 168 hours.  Recent Results (from the past 240 hours)  Resp panel by RT-PCR (RSV, Flu A&B,  Covid) Anterior Nasal Swab     Status: None   Collection Time: 07/11/23  9:03 AM   Specimen: Anterior Nasal Swab  Result Value Ref Range Status   SARS Coronavirus 2 by RT PCR NEGATIVE NEGATIVE Final    Comment: (NOTE) SARS-CoV-2 target nucleic acids are NOT DETECTED.  The SARS-CoV-2 RNA is generally detectable in upper respiratory specimens during the acute phase of infection. The lowest concentration of SARS-CoV-2 viral copies this assay can detect is 138 copies/mL. A negative result does not preclude SARS-Cov-2 infection and should not be used as the sole basis for treatment or other patient management decisions. A negative result may occur with  improper specimen collection/handling, submission of specimen other than nasopharyngeal swab, presence of viral mutation(s) within the areas targeted by this assay, and inadequate number of viral copies(<138 copies/mL). A negative result must  be combined with clinical observations, patient history, and epidemiological information. The expected result is Negative.  Fact Sheet for Patients:  BloggerCourse.com  Fact Sheet for Healthcare Providers:  SeriousBroker.it  This test is no t yet approved or cleared by the [place] FDA and  has been authorized for detection and/or diagnosis of SARS-CoV-2 by FDA under an Emergency Use Authorization (EUA). This EUA will remain  in effect (meaning this test can be used) for the duration of the COVID-19 declaration under Section 564(b)(1) of the Act, 21 U.S.C.section 360bbb-3(b)(1), unless the authorization is terminated  or revoked sooner.       Influenza A by PCR NEGATIVE NEGATIVE Final   Influenza B by PCR NEGATIVE NEGATIVE Final    Comment: (NOTE) The Xpert Xpress SARS-CoV-2/FLU/RSV plus assay is intended as an aid in the diagnosis of influenza from Nasopharyngeal swab specimens and should not be used as a sole basis for treatment. Nasal washings and aspirates are unacceptable for Xpert Xpress SARS-CoV-2/FLU/RSV testing.  Fact Sheet for Patients: BloggerCourse.com  Fact Sheet for Healthcare Providers: SeriousBroker.it  This test is not yet approved or cleared by the [place] FDA and has been authorized for detection and/or diagnosis of SARS-CoV-2 by FDA under an Emergency Use Authorization (EUA). This EUA will remain in effect (meaning this test can be used) for the duration of the COVID-19 declaration under Section 564(b)(1) of the Act, 21 U.S.C. section 360bbb-3(b)(1), unless the authorization is terminated or revoked.     Resp Syncytial Virus by PCR NEGATIVE NEGATIVE Final    Comment: (NOTE) Fact Sheet for Patients: BloggerCourse.com  Fact Sheet for Healthcare Providers: SeriousBroker.it  This test is not  yet approved or cleared by the [place] FDA and has been authorized for detection and/or diagnosis of SARS-CoV-2 by FDA under an Emergency Use Authorization (EUA). This EUA will remain in effect (meaning this test can be used) for the duration of the COVID-19 declaration under Section 564(b)(1) of the Act, 21 U.S.C. section 360bbb-3(b)(1), unless the authorization is terminated or revoked.  Performed at [facility], [address] [place], [place] [address]      Radiology Studies: No results found.    Scheduled Meds:  aspirin EC  81 mg Oral Daily   enoxaparin (LOVENOX) injection  30 mg Subcutaneous Q24H   furosemide  20 mg Oral Daily   levETIRAcetam  500 mg Oral BID   levothyroxine  88 mcg Oral QAC breakfast   LORazepam  1 mg Intramuscular Once   metoprolol succinate  50 mg Oral Daily   mometasone-formoterol  2 puff Inhalation BID   And  umeclidinium bromide  1 puff Inhalation Daily   pantoprazole  40 mg Oral Daily   rosuvastatin  10 mg Oral Daily   Continuous Infusions:     LOS: 6 days   Joycelyn Das, MD [facility] If 7PM-7AM, please contact night-coverage [email or web address]  07/17/2023, 10:53 AM

## 2023-07-18 DIAGNOSIS — R531 Weakness: Secondary | ICD-10-CM | POA: Diagnosis not present

## 2023-07-18 DIAGNOSIS — E039 Hypothyroidism, unspecified: Secondary | ICD-10-CM | POA: Diagnosis not present

## 2023-07-18 DIAGNOSIS — I5032 Chronic diastolic (congestive) heart failure: Secondary | ICD-10-CM | POA: Diagnosis not present

## 2023-07-18 DIAGNOSIS — Z515 Encounter for palliative care: Secondary | ICD-10-CM | POA: Diagnosis not present

## 2023-07-18 DIAGNOSIS — D638 Anemia in other chronic diseases classified elsewhere: Secondary | ICD-10-CM | POA: Diagnosis not present

## 2023-07-18 DIAGNOSIS — Z79890 Hormone replacement therapy (postmenopausal): Secondary | ICD-10-CM | POA: Diagnosis not present

## 2023-07-18 DIAGNOSIS — Z66 Do not resuscitate: Secondary | ICD-10-CM | POA: Diagnosis not present

## 2023-07-18 DIAGNOSIS — R4182 Altered mental status, unspecified: Secondary | ICD-10-CM | POA: Diagnosis not present

## 2023-07-18 DIAGNOSIS — R062 Wheezing: Secondary | ICD-10-CM | POA: Diagnosis not present

## 2023-07-18 DIAGNOSIS — J449 Chronic obstructive pulmonary disease, unspecified: Secondary | ICD-10-CM | POA: Diagnosis not present

## 2023-07-18 DIAGNOSIS — R195 Other fecal abnormalities: Secondary | ICD-10-CM | POA: Diagnosis not present

## 2023-07-18 DIAGNOSIS — I959 Hypotension, unspecified: Secondary | ICD-10-CM | POA: Diagnosis not present

## 2023-07-18 DIAGNOSIS — J9600 Acute respiratory failure, unspecified whether with hypoxia or hypercapnia: Secondary | ICD-10-CM | POA: Diagnosis not present

## 2023-07-18 DIAGNOSIS — I739 Peripheral vascular disease, unspecified: Secondary | ICD-10-CM | POA: Diagnosis not present

## 2023-07-18 DIAGNOSIS — C3491 Malignant neoplasm of unspecified part of right bronchus or lung: Secondary | ICD-10-CM | POA: Diagnosis not present

## 2023-07-18 DIAGNOSIS — R071 Chest pain on breathing: Secondary | ICD-10-CM | POA: Diagnosis not present

## 2023-07-18 DIAGNOSIS — Z9981 Dependence on supplemental oxygen: Secondary | ICD-10-CM | POA: Diagnosis not present

## 2023-07-18 DIAGNOSIS — Z7189 Other specified counseling: Secondary | ICD-10-CM | POA: Diagnosis not present

## 2023-07-18 DIAGNOSIS — C50412 Malignant neoplasm of upper-outer quadrant of left female breast: Secondary | ICD-10-CM | POA: Diagnosis not present

## 2023-07-18 DIAGNOSIS — J90 Pleural effusion, not elsewhere classified: Secondary | ICD-10-CM | POA: Diagnosis not present

## 2023-07-18 DIAGNOSIS — J9621 Acute and chronic respiratory failure with hypoxia: Secondary | ICD-10-CM | POA: Diagnosis not present

## 2023-07-18 DIAGNOSIS — G9341 Metabolic encephalopathy: Secondary | ICD-10-CM | POA: Diagnosis not present

## 2023-07-18 DIAGNOSIS — Z7401 Bed confinement status: Secondary | ICD-10-CM | POA: Diagnosis not present

## 2023-07-18 DIAGNOSIS — I5033 Acute on chronic diastolic (congestive) heart failure: Secondary | ICD-10-CM | POA: Diagnosis not present

## 2023-07-18 DIAGNOSIS — K921 Melena: Secondary | ICD-10-CM | POA: Diagnosis not present

## 2023-07-18 DIAGNOSIS — R918 Other nonspecific abnormal finding of lung field: Secondary | ICD-10-CM | POA: Diagnosis not present

## 2023-07-18 DIAGNOSIS — I619 Nontraumatic intracerebral hemorrhage, unspecified: Secondary | ICD-10-CM | POA: Diagnosis not present

## 2023-07-18 DIAGNOSIS — I110 Hypertensive heart disease with heart failure: Secondary | ICD-10-CM | POA: Diagnosis not present

## 2023-07-18 DIAGNOSIS — J9622 Acute and chronic respiratory failure with hypercapnia: Secondary | ICD-10-CM | POA: Diagnosis not present

## 2023-07-18 DIAGNOSIS — R262 Difficulty in walking, not elsewhere classified: Secondary | ICD-10-CM | POA: Diagnosis not present

## 2023-07-18 DIAGNOSIS — Z781 Physical restraint status: Secondary | ICD-10-CM | POA: Diagnosis not present

## 2023-07-18 DIAGNOSIS — J9601 Acute respiratory failure with hypoxia: Secondary | ICD-10-CM | POA: Diagnosis not present

## 2023-07-18 DIAGNOSIS — D500 Iron deficiency anemia secondary to blood loss (chronic): Secondary | ICD-10-CM | POA: Diagnosis not present

## 2023-07-18 DIAGNOSIS — Z681 Body mass index (BMI) 19 or less, adult: Secondary | ICD-10-CM | POA: Diagnosis not present

## 2023-07-18 DIAGNOSIS — Z79899 Other long term (current) drug therapy: Secondary | ICD-10-CM | POA: Diagnosis not present

## 2023-07-18 DIAGNOSIS — J441 Chronic obstructive pulmonary disease with (acute) exacerbation: Secondary | ICD-10-CM | POA: Diagnosis not present

## 2023-07-18 DIAGNOSIS — E785 Hyperlipidemia, unspecified: Secondary | ICD-10-CM | POA: Diagnosis not present

## 2023-07-18 DIAGNOSIS — F17210 Nicotine dependence, cigarettes, uncomplicated: Secondary | ICD-10-CM | POA: Diagnosis not present

## 2023-07-18 DIAGNOSIS — C3492 Malignant neoplasm of unspecified part of left bronchus or lung: Secondary | ICD-10-CM | POA: Diagnosis not present

## 2023-07-18 DIAGNOSIS — R0902 Hypoxemia: Secondary | ICD-10-CM | POA: Diagnosis not present

## 2023-07-18 DIAGNOSIS — E876 Hypokalemia: Secondary | ICD-10-CM | POA: Diagnosis not present

## 2023-07-18 DIAGNOSIS — R41841 Cognitive communication deficit: Secondary | ICD-10-CM | POA: Diagnosis not present

## 2023-07-18 DIAGNOSIS — G40901 Epilepsy, unspecified, not intractable, with status epilepticus: Secondary | ICD-10-CM | POA: Diagnosis not present

## 2023-07-18 DIAGNOSIS — R569 Unspecified convulsions: Secondary | ICD-10-CM | POA: Diagnosis not present

## 2023-07-18 DIAGNOSIS — I6529 Occlusion and stenosis of unspecified carotid artery: Secondary | ICD-10-CM | POA: Diagnosis not present

## 2023-07-18 DIAGNOSIS — E43 Unspecified severe protein-calorie malnutrition: Secondary | ICD-10-CM | POA: Diagnosis not present

## 2023-07-18 DIAGNOSIS — R0603 Acute respiratory distress: Secondary | ICD-10-CM | POA: Diagnosis not present

## 2023-07-18 DIAGNOSIS — I214 Non-ST elevation (NSTEMI) myocardial infarction: Secondary | ICD-10-CM | POA: Diagnosis not present

## 2023-07-18 DIAGNOSIS — J690 Pneumonitis due to inhalation of food and vomit: Secondary | ICD-10-CM | POA: Diagnosis not present

## 2023-07-18 DIAGNOSIS — M6281 Muscle weakness (generalized): Secondary | ICD-10-CM | POA: Diagnosis not present

## 2023-07-18 DIAGNOSIS — I701 Atherosclerosis of renal artery: Secondary | ICD-10-CM | POA: Diagnosis not present

## 2023-07-18 DIAGNOSIS — I6782 Cerebral ischemia: Secondary | ICD-10-CM | POA: Diagnosis not present

## 2023-07-18 DIAGNOSIS — R410 Disorientation, unspecified: Secondary | ICD-10-CM | POA: Diagnosis not present

## 2023-07-18 DIAGNOSIS — R000 Tachycardia, unspecified: Secondary | ICD-10-CM | POA: Diagnosis not present

## 2023-07-18 DIAGNOSIS — R911 Solitary pulmonary nodule: Secondary | ICD-10-CM | POA: Diagnosis not present

## 2023-07-18 DIAGNOSIS — I69319 Unspecified symptoms and signs involving cognitive functions following cerebral infarction: Secondary | ICD-10-CM | POA: Diagnosis not present

## 2023-07-18 DIAGNOSIS — Z4682 Encounter for fitting and adjustment of non-vascular catheter: Secondary | ICD-10-CM | POA: Diagnosis not present

## 2023-07-18 DIAGNOSIS — K219 Gastro-esophageal reflux disease without esophagitis: Secondary | ICD-10-CM | POA: Diagnosis not present

## 2023-07-18 DIAGNOSIS — I69351 Hemiplegia and hemiparesis following cerebral infarction affecting right dominant side: Secondary | ICD-10-CM | POA: Diagnosis not present

## 2023-07-18 DIAGNOSIS — I10 Essential (primary) hypertension: Secondary | ICD-10-CM | POA: Diagnosis not present

## 2023-07-18 DIAGNOSIS — J9611 Chronic respiratory failure with hypoxia: Secondary | ICD-10-CM | POA: Diagnosis not present

## 2023-07-18 DIAGNOSIS — R627 Adult failure to thrive: Secondary | ICD-10-CM | POA: Diagnosis not present

## 2023-07-18 MED ORDER — METOPROLOL SUCCINATE ER 50 MG PO TB24: 50.0000 mg | ORAL_TABLET | Freq: Every day | ORAL | 2 refills | Status: AC

## 2023-07-18 MED ORDER — SENNOSIDES-DOCUSATE SODIUM 8.6-50 MG PO TABS
1.0000 | ORAL_TABLET | Freq: Once | ORAL | Status: AC
  Administered 2023-07-18: 1 via ORAL
  Filled 2023-07-18: qty 1

## 2023-07-18 NOTE — TOC Transition Note (Signed)
Transition of Care [facility]) - Discharge Note   Patient Details  Name: Kendra Watts MRN: [id number] Date of Birth: 1946/05/15  Transition of Care [facility]) CM/SW Contact:  Larrie Kass, LCSW Phone Number: 07/18/2023, 11:41 AM   Clinical Narrative:    CSW spoke with pt's regarding discharge decision. Pt has changed her mind and would like SNF placement. Pt has chosen [facility].  Spoke to [place] with [facility] , they have an available bed today.   Pt to d/c to [facility] , room 213 RN to call report [phone], PTAR called. No further TOC needs, TOC sign off.   Final next level of care: Skilled Nursing Facility Barriers to Discharge: No Barriers Identified   Patient Goals and CMS Choice Patient states their goals for this hospitalization and ongoing recovery are:: SNF to get stronger CMS Medicare.gov Compare Post Acute Care list provided to:: Patient Choice offered to / list presented to : Patient      Discharge Placement                  Name of family member notified: [name] (Sister)  [phone] [facility]) Patient and family notified of of transfer: 07/18/23  Discharge Plan and Services Additional resources added to the After Visit Summary for                                       Social Drivers of Health (SDOH) Interventions SDOH Screenings   Food Insecurity: No Food Insecurity (07/12/2023)  Housing: Low Risk  (07/12/2023)  Transportation Needs: No Transportation Needs (07/12/2023)  Utilities: Not At Risk (07/12/2023)  Alcohol Screen: Low Risk  (06/20/2020)  Depression (PHQ2-9): Low Risk  (06/20/2020)  Financial Resource Strain: Low Risk  (06/20/2020)  Physical Activity: Insufficiently Active (06/20/2020)  Social Connections: Moderately Isolated (06/20/2020)  Stress: No Stress Concern Present (06/20/2020)  Tobacco Use: High Risk (07/11/2023)     Readmission Risk Interventions    07/14/2023   12:45 PM  Readmission Risk  Prevention Plan  Transportation Screening Complete  PCP or Specialist Appt within 5-7 Days Complete  Home Care Screening Complete  Medication Review (RN CM) Complete

## 2023-07-18 NOTE — Discharge Summary (Addendum)
Physician Discharge Summary  Kendra Watts:[id number] DOB: 07-31-1945 DOA: 07/11/2023  PCP: Georgianne Fick, MD  Admit date: 07/11/2023 Discharge date: 07/18/2023  Admitted From: Home  Discharge disposition: Skilled nursing facility   Recommendations for Outpatient Follow-Up:   Follow up with your primary care provider at the skilled nursing facility in 3 to 5 days. Check CBC, BMP, magnesium in the next visit Follow-up with Dr. Delton Coombes in 2 to 3 weeks to discuss about lung nodule/COPD.   Discharge Diagnosis:   Principal Problem:   NSTEMI (non-ST elevated myocardial infarction) [facility]) Active Problems:   Acute on chronic diastolic heart failure (HCC)    Discharge Condition: Improved.  Diet recommendation: Low sodium, heart healthy.  Carbohydrate-modified.  Regular.  Wound care: None.  Code status: Full.   History of Present Illness:   77 year old female with past medical history significant for COPD on chronic oxygen, peripheral artery disease with renal artery stenosis s/p renal artery stenting 2011 and 2016, carotid stenosis, bilateral iliac disease as well as chronic heart failure with preserved ejection fraction, hypertension, hypothyroidism, treated lung cancer being admitted with non-STEMI.  Patient presents complaining of shortness of breath associated for the last 2 days.  She denies chest pain.  She uses 3 L of oxygen at home she has chronic cough.  At this time, patient is stable for disposition to skilled nursing facility    Hospital Course:   Following conditions were addressed during hospitalization as listed below,  Non-STEMI:  Troponin was at 2600 and patient presented with dyspnea with some ST depression and nonspecific T wave changes.  Seen by cardiology and was started on heparin drip.  Status post heparin drip for 48 hours.  No further chest pain or shortness of breath.  Continue metoprolol.  Remains stable at this time.  Metoprolol dose has  been increased to 50 mg at this time on discharge.   Pulmonary nodule:  Enlarging now 1.6 cm superior segment of the left lower lobe.  Follows up with Dr. Delton Coombes, pulmonary as outpatient.   Plan was to repeat another CT chest in 6 months she is due January 8.  Will need to follow-up for pulmonary nodule as outpatient.  Informed the patient and sister about it yesterday on 07/13/2023.  Patient stated that she does have an appointment with pulmonary soon.   Acute on Chronic HFpEF  Improved volume status at this time.  2D echocardiogram from 4/23 with LV ejection fraction of 55 to 60% with diastolic dysfunction.  BNP  Elevated.  Received IV Lasix during hospitalization and has been transitioned to oral Lasix since 07/15/2023.  Has been compensated   Essential Hypertension:  on amlodipine and Lopressor. On Cardura at home   COPD on chronic 3 L of oxygen:  -Continue Dulera,  Incruse, as needed albuterol.  Currently on 3L of oxygen.   Seizure disorder, with history of stroke:  -Continue Keppra   Acute metabolic encephalopathy; agitation.  Resolved.  Oriented and calm at this time.   Hyperlipidemia:  Continue with Crestor   Hypokalemia: Improved after repletion.  Latest potassium of 3.8   Hypothyroidism: Continue Synthroid.    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 Continue crestor   Prolonged QT: Potassium replenished.  Latest potassium 3.8 with magnesium of 2.0.   PAD: Extensive PAD history with renal artery stenosis status post bilateral renal artery stenting, carotid disease status post right CEA, mild lower extremity arterial disease treated medically.  Patient was on aspirin and statins as outpatient.  Debility deconditioning.  Patient has been seen by physical therapy and recommend skilled nursing facility.  Disposition.  At this time, patient is stable for disposition to skilled nursing facility.  Medical Consultants:   Cardiology  Procedures:     None Subjective:   Today, patient was seen and examined at bedside.  Feels okay.  Denies overt pain, nausea, vomiting.  Discharge Exam:   Vitals:   07/18/23 0346 07/18/23 0548  BP: (!) 163/79 132/74  Pulse: 73 67  Resp: 16   Temp: 98.4 F (36.9 C)   SpO2: 100% 100%   Vitals:   07/17/23 1935 07/17/23 1950 07/18/23 0346 07/18/23 0548  BP: (!) 169/72  (!) 163/79 132/74  Pulse: 83  73 67  Resp: 16  16   Temp: 98.5 F (36.9 C)  98.4 F (36.9 C)   TempSrc: Oral  Oral   SpO2: 99% 99% 100% 100%  Weight:      Height:       Body mass index is 15.09 kg/m.  General: Alert awake, not in obvious distress, thinly built on 3 L of oxygen by nasal cannula HENT: pupils equally reacting to light,  No scleral pallor or icterus noted. Oral mucosa is moist.  Chest:  Clear breath sounds.  Diminished breath sounds bilaterally. No crackles or wheezes.  CVS: S1 &S2 heard. No murmur.  Regular rate and rhythm. Abdomen: Soft, nontender, nondistended.  Bowel sounds are heard.   Extremities: No cyanosis, clubbing or edema.  Peripheral pulses are palpable. Psych: Alert, awake and oriented, normal mood CNS:  No cranial nerve deficits.  Generalized weakness noted Skin: Warm and dry.  No rashes noted.  The results of significant diagnostics from this hospitalization (including imaging, microbiology, ancillary and laboratory) are listed below for reference.     Diagnostic Studies:   CT HEAD WO CONTRAST ( ) Result Date: 07/12/2023 CLINICAL DATA:  Altered mental status EXAM: CT HEAD WITHOUT CONTRAST TECHNIQUE: Contiguous axial images were obtained from the base of the skull through the vertex without intravenous contrast. RADIATION DOSE REDUCTION: This exam was performed according to the departmental dose-optimization program which includes automated exposure control, adjustment of the mA and/or kV according to patient size and/or use of iterative reconstruction technique. COMPARISON:  11/12/2021  FINDINGS: Brain: There is no mass, hemorrhage or extra-axial collection. The size and configuration of the ventricles and extra-axial CSF spaces are normal. There is hypoattenuation of the white matter, most commonly indicating chronic small vessel disease. Vascular: Atherosclerotic calcification of the internal carotid arteries at the skull base. No abnormal hyperdensity of the major intracranial arteries or dural venous sinuses. Skull: The visualized skull base, calvarium and extracranial soft tissues are normal. Sinuses/Orbits: No fluid levels or advanced mucosal thickening of the visualized paranasal sinuses. No mastoid or middle ear effusion. Normal orbits. Other: None. IMPRESSION: 1. No acute intracranial abnormality. 2. Chronic small vessel disease. Electronically Signed   By: Deatra Robinson M.D.   On: 07/12/2023 21:16   ECHOCARDIOGRAM COMPLETE Result Date: 07/12/2023    ECHOCARDIOGRAM REPORT   Patient Name:   AABRIELLA BENDON [facility] Date of Exam: 07/12/2023 Medical Rec #:  [id number]       Height:       64.0 in Accession #:    [id number]      Weight:       87.9 lb Date of Birth:  18-Feb-1946       BSA:          1.379 m Patient Age:  77 years        BP:           107/63 mmHg Patient Gender: F               HR:           65 bpm. Exam Location:  Inpatient Procedure: 2D Echo, Cardiac Doppler and Color Doppler Indications:    NSTEMI  History:        Patient has prior history of Echocardiogram examinations, most                 recent 11/13/2021. COPD; Risk Factors:Hypertension and Current                 Smoker.  Sonographer:    Vern Claude Referring Phys: [id number] CALLIE E GOODRICH IMPRESSIONS  1. Left ventricular ejection fraction, by estimation, is 55 to 60%. The left ventricle has normal function. The left ventricle has no regional wall motion abnormalities. Left ventricular diastolic parameters are indeterminate. Elevated left atrial pressure.  2. Right ventricular systolic function is normal. The right  ventricular size is normal. Tricuspid regurgitation signal is inadequate for assessing PA pressure.  3. The mitral valve is normal in structure. No evidence of mitral valve regurgitation. No evidence of mitral stenosis.  4. The aortic valve is normal in structure. Aortic valve regurgitation is trivial. No aortic stenosis is present.  5. The inferior vena cava is dilated in size with <50% respiratory variability, suggesting right atrial pressure of 15 mmHg. Comparison(s): No significant change from prior study. Prior images reviewed side by side. FINDINGS  Left Ventricle: Left ventricular ejection fraction, by estimation, is 55 to 60%. The left ventricle has normal function. The left ventricle has no regional wall motion abnormalities. The left ventricular internal cavity size was normal in size. Suboptimal image quality limits for assessment of left ventricular hypertrophy. Left ventricular diastolic parameters are indeterminate. Elevated left atrial pressure. Right Ventricle: The right ventricular size is normal. No increase in right ventricular wall thickness. Right ventricular systolic function is normal. Tricuspid regurgitation signal is inadequate for assessing PA pressure. Left Atrium: Left atrial size was normal in size. Right Atrium: Right atrial size was normal in size. Pericardium: There is no evidence of pericardial effusion. Mitral Valve: The mitral valve is normal in structure. No evidence of mitral valve regurgitation. No evidence of mitral valve stenosis. MV peak gradient, 4.5 mmHg. The mean mitral valve gradient is 1.0 mmHg. Tricuspid Valve: The tricuspid valve is normal in structure. Tricuspid valve regurgitation is not demonstrated. No evidence of tricuspid stenosis. Aortic Valve: The aortic valve is normal in structure. Aortic valve regurgitation is trivial. Aortic regurgitation PHT measures 493 msec. No aortic stenosis is present. Aortic valve mean gradient measures 2.0 mmHg. Aortic valve peak  gradient measures 3.8  mmHg. Aortic valve area, by VTI measures 2.00 cm. Pulmonic Valve: The pulmonic valve was normal in structure. Pulmonic valve regurgitation is trivial. No evidence of pulmonic stenosis. Aorta: The aortic root and ascending aorta are structurally normal, with no evidence of dilitation. Venous: The inferior vena cava is dilated in size with less than 50% respiratory variability, suggesting right atrial pressure of 15 mmHg. IAS/Shunts: The atrial septum is grossly normal.  LEFT VENTRICLE PLAX 2D LVIDd:         4.35 cm      Diastology LVIDs:         3.20 cm      LV e' medial:    4.03  cm/s LV PW:         0.95 cm      LV E/e' medial:  13.7 LV IVS:        0.95 cm      LV e' lateral:   2.50 cm/s LVOT diam:     1.70 cm      LV E/e' lateral: 22.1 LV SV:         42 LV SV Index:   30 LVOT Area:     2.27 cm  LV Volumes (MOD) LV vol d, MOD A2C: 106.0 ml LV vol d, MOD A4C: 87.2 ml LV vol s, MOD A2C: 38.8 ml LV vol s, MOD A4C: 44.7 ml LV SV MOD A2C:     67.2 ml LV SV MOD A4C:     87.2 ml LV SV MOD BP:      53.9 ml RIGHT VENTRICLE             IVC RV Basal diam:  3.80 cm     IVC diam: 2.20 cm RV Mid diam:    2.60 cm RV S prime:     10.90 cm/s TAPSE (M-mode): 1.6 cm LEFT ATRIUM             Index        RIGHT ATRIUM           Index LA diam:        2.40 cm 1.74 cm/m   RA Area:     11.60 cm LA Vol (A2C):   38.5 ml 27.91 ml/m  RA Volume:   26.40 ml  19.14 ml/m LA Vol (A4C):   19.9 ml 14.43 ml/m LA Biplane Vol: 28.2 ml 20.44 ml/m  AORTIC VALVE                    PULMONIC VALVE AV Area (Vmax):    1.82 cm     PV Vmax:          0.78 m/s AV Area (Vmean):   1.46 cm     PV Peak grad:     2.4 mmHg AV Area (VTI):     2.00 cm     PR End Diast Vel: 3.99 msec AV Vmax:           97.40 cm/s AV Vmean:          69.600 cm/s AV VTI:            0.210 m AV Peak Grad:      3.8 mmHg AV Mean Grad:      2.0 mmHg LVOT Vmax:         78.30 cm/s LVOT Vmean:        44.700 cm/s LVOT VTI:          0.185 m LVOT/AV VTI ratio: 0.88 AI PHT:             493 msec  AORTA Ao Root diam: 2.90 cm Ao Asc diam:  2.60 cm MITRAL VALVE MV Area (PHT): 2.95 cm    SHUNTS MV Area VTI:   1.85 cm    Systemic VTI:  0.18 m MV Peak grad:  4.5 mmHg    Systemic Diam: 1.70 cm MV Mean grad:  1.0 mmHg MV Vmax:       1.06 m/s MV Vmean:      45.2 cm/s MV Decel Time: 257 msec MV E velocity: 55.20 cm/s MV A velocity: 88.50 cm/s MV E/A ratio:  0.62 Riley Lam MD  Electronically signed by Riley Lam MD Signature Date/Time: 07/12/2023/3:37:41 PM    Final    CT Angio Chest PE W/Cm &/Or Wo Cm Result Date: 07/11/2023 CLINICAL DATA:  Shortness of breath on home oxygen. Pulmonary embolism suspected, high probability. EXAM: CT ANGIOGRAPHY CHEST WITH CONTRAST TECHNIQUE: Multidetector CT imaging of the chest was performed using the standard protocol during bolus administration of intravenous contrast. Multiplanar CT image reconstructions and MIPs were obtained to evaluate the vascular anatomy. RADIATION DOSE REDUCTION: This exam was performed according to the departmental dose-optimization program which includes automated exposure control, adjustment of the mA and/or kV according to patient size and/or use of iterative reconstruction technique. CONTRAST:  60mL OMNIPAQUE IOHEXOL 350 MG/ML SOLN COMPARISON:  Radiographs 07/11/2023 and 09/23/2022. Noncontrast chest CT 01/15/2023. Contrast enhanced chest CT 09/23/2022. FINDINGS: Cardiovascular: The pulmonary arteries are well opacified with contrast to the level of the segmental branches. There is no evidence of acute pulmonary embolism. Central enlargement of the pulmonary arteries consistent with pulmonary arterial hypertension. There is limited opacification of the aorta. No acute systemic arterial abnormalities are identified. Diffuse atherosclerosis of the aorta, great vessels and coronary arteries with an aberrant retroesophageal right subclavian artery. Based on prior imaging, this vessel appears chronically  occluded. The heart size is normal. There is no pericardial effusion. Mediastinum/Nodes: There are no enlarged mediastinal, hilar or axillary lymph nodes.Grossly stable small subcarinal lymph nodes. The thyroid gland, trachea and esophagus demonstrate no significant findings. Lungs/Pleura: No pleural effusion or pneumothorax. Mild-to-moderate centrilobular and paraseptal emphysema with diffuse central airway thickening. Chronic radiation changes are again noted in both upper lobes adjacent to the fiducial markers. On the right, there is a possible developing nodular lesion at the apex with central cavitation, measuring approximately 1.8 x 1.7 cm on image 13/12. The left upper lobe radiation changes have not significantly changed. However, the previously demonstrated solid nodule in the superior segment of the left lower lobe has enlarged, measuring 1.6 x 1.0 cm on image 50/12 (previously 0.9 x 0.8 cm. No other enlarging nodules are identified. Additional scattered small nodules are unchanged. Upper abdomen: No acute findings are seen in the visualized upper abdomen. Left renal cortical thinning and severe aortic atherosclerosis noted. Musculoskeletal/Chest wall: There is no chest wall mass or suspicious osseous finding. There are nonacute rib fractures bilaterally, including pathologic fractures of the right 2nd and 3rd ribs attributed to prior radiation therapy. Lower left sided rib fractures have occurred in the interval, but are partially healed. Chronic severe compression fracture at T3, grossly unchanged. Review of the MIP images confirms the above findings. IMPRESSION: 1. No evidence of acute pulmonary embolism or other acute vascular findings in the chest. 2. Chronic radiation changes in both upper lobes with possible developing nodular lesion at the right apex with central cavitation. 3. Enlarging solid nodule in the superior segment of the left lower lobe, suspicious for metastatic disease. 4. No adenopathy  or other evidence of metastatic disease. Recommend short-term CT follow-. PET-CT could be considered. 5. Aortic Atherosclerosis (ICD10-I70.0) and Emphysema (ICD10-J43.9). Electronically Signed   By: Carey Bullocks M.D.   On: 07/11/2023 12:48   DG Chest 2 View Result Date: 07/11/2023 CLINICAL DATA:  Shortness of breath.  Lung cancer. EXAM: CHEST - 2 VIEW COMPARISON:  Chest x-ray 11/21/2021.  Chest CT 01/15/2023. FINDINGS: Fiduciary markers and bilateral upper lobe parenchymal opacities appear similar to prior CT given differences in technique. Left upper lobe nodule is also unchanged from prior CT. There is no new focal  lung infiltrate, pleural effusion or pneumothorax identified. There are multiple healed left-sided rib fractures which appear new from prior. Cardiac silhouette is enlarged, unchanged. Surgical clips are seen in the anterior left chest. IMPRESSION: 1. No acute cardiopulmonary process. 2. Fiduciary markers and bilateral upper lobe parenchymal opacities appear similar to prior CT given differences in technique. 3. Multiple healed left-sided rib fractures which appear new from prior. Electronically Signed   By: Darliss Cheney M.D.   On: 07/11/2023 03:25     Labs:   Basic Metabolic Panel: Recent Labs  Lab 07/12/23 0742 07/13/23 0745 07/14/23 0713 07/16/23 0336  NA 140 134* 133* 137  K 3.2* 3.8 4.2 3.8  CL 99 98 97* 98  CO2 31 27 30  34*  GLUCOSE 86 96 108* 105*  BUN 14 13 16 20   CREATININE 0.61 0.64 0.58 0.85  CALCIUM 8.2* 8.0* 8.2* 8.6*  MG  --  1.8 2.0  --    GFR Estimated Creatinine Clearance: 34.9 mL/min (by C-G formula based on SCr of 0.85 mg/dL). Liver Function Tests: Recent Labs  Lab 07/12/23 0742  AST 62*  ALT 63*  ALKPHOS 68  BILITOT 0.8  PROT 6.1*  ALBUMIN 3.5   No results for input(s): "LIPASE", "AMYLASE" in the last 168 hours. No results for input(s): "AMMONIA" in the last 168 hours. Coagulation profile No results for input(s): "INR", "PROTIME" in the  last 168 hours.  CBC: Recent Labs  Lab 07/12/23 0742 07/13/23 0745 07/14/23 0713  WBC 9.1 8.3 9.9  HGB 9.9* 9.7* 9.8*  HCT 32.7* 32.0* 31.8*  MCV 91.9 92.5 91.6  PLT 238 222 203   Cardiac Enzymes: No results for input(s): "CKTOTAL", "CKMB", "CKMBINDEX", "TROPONINI" in the last 168 hours. BNP: Invalid input(s): "POCBNP" CBG: No results for input(s): "GLUCAP" in the last 168 hours. D-Dimer No results for input(s): "DDIMER" in the last 72 hours. Hgb A1c No results for input(s): "HGBA1C" in the last 72 hours. Lipid Profile No results for input(s): "CHOL", "HDL", "LDLCALC", "TRIG", "CHOLHDL", "LDLDIRECT" in the last 72 hours. Thyroid function studies No results for input(s): "TSH", "T4TOTAL", "T3FREE", "THYROIDAB" in the last 72 hours.  Invalid input(s): "FREET3" Anemia work up No results for input(s): "VITAMINB12", "FOLATE", "FERRITIN", "TIBC", "IRON", "RETICCTPCT" in the last 72 hours. Microbiology Recent Results (from the past 240 hours)  Resp panel by RT-PCR (RSV, Flu A&B, Covid) Anterior Nasal Swab     Status: None   Collection Time: 07/11/23  9:03 AM   Specimen: Anterior Nasal Swab  Result Value Ref Range Status   SARS Coronavirus 2 by RT PCR NEGATIVE NEGATIVE Final    Comment: (NOTE) SARS-CoV-2 target nucleic acids are NOT DETECTED.  The SARS-CoV-2 RNA is generally detectable in upper respiratory specimens during the acute phase of infection. The lowest concentration of SARS-CoV-2 viral copies this assay can detect is 138 copies/mL. A negative result does not preclude SARS-Cov-2 infection and should not be used as the sole basis for treatment or other patient management decisions. A negative result may occur with  improper specimen collection/handling, submission of specimen other than nasopharyngeal swab, presence of viral mutation(s) within the areas targeted by this assay, and inadequate number of viral copies(<138 copies/mL). A negative result must be combined  with clinical observations, patient history, and epidemiological information. The expected result is Negative.  Fact Sheet for Patients:  BloggerCourse.com  Fact Sheet for Healthcare Providers:  SeriousBroker.it  This test is no t yet approved or cleared by the [place] and  has been authorized for detection and/or diagnosis of SARS-CoV-2 by FDA under an Emergency Use Authorization (EUA). This EUA will remain  in effect (meaning this test can be used) for the duration of the COVID-19 declaration under Section 564(b)(1) of the Act, 21 U.S.C.section 360bbb-3(b)(1), unless the authorization is terminated  or revoked sooner.       Influenza A by PCR NEGATIVE NEGATIVE Final   Influenza B by PCR NEGATIVE NEGATIVE Final    Comment: (NOTE) The Xpert Xpress SARS-CoV-2/FLU/RSV plus assay is intended as an aid in the diagnosis of influenza from Nasopharyngeal swab specimens and should not be used as a sole basis for treatment. Nasal washings and aspirates are unacceptable for Xpert Xpress SARS-CoV-2/FLU/RSV testing.  Fact Sheet for Patients: BloggerCourse.com  Fact Sheet for Healthcare Providers: SeriousBroker.it  This test is not yet approved or cleared by the [place] FDA and has been authorized for detection and/or diagnosis of SARS-CoV-2 by FDA under an Emergency Use Authorization (EUA). This EUA will remain in effect (meaning this test can be used) for the duration of the COVID-19 declaration under Section 564(b)(1) of the Act, 21 U.S.C. section 360bbb-3(b)(1), unless the authorization is terminated or revoked.     Resp Syncytial Virus by PCR NEGATIVE NEGATIVE Final    Comment: (NOTE) Fact Sheet for Patients: BloggerCourse.com  Fact Sheet for Healthcare Providers: SeriousBroker.it  This test is not yet approved  or cleared by the [place] FDA and has been authorized for detection and/or diagnosis of SARS-CoV-2 by FDA under an Emergency Use Authorization (EUA). This EUA will remain in effect (meaning this test can be used) for the duration of the COVID-19 declaration under Section 564(b)(1) of the Act, 21 U.S.C. section 360bbb-3(b)(1), unless the authorization is terminated or revoked.  Performed at [facility], [address], [place], [place] [address]      Discharge Instructions:   Discharge Instructions     Call MD for:  persistant nausea and vomiting   Complete by: As directed    Call MD for:  severe uncontrolled pain   Complete by: As directed    Call MD for:  temperature >100.4   Complete by: As directed    Diet general   Complete by: As directed    Discharge instructions   Complete by: As directed    Follow-up with your primary care provider in 1 week.  Check blood work at that visit.  Follow up with dr Delton Coombes in 2-3 weeks regarding copd and lung nodule. Check blood work at that time.  Seek medical attention for worsening symptoms.   Increase activity slowly   Complete by: As directed       Allergies as of 07/18/2023   No Known Allergies      Medication List     STOP taking these medications    albuterol 108 (90 Base) MCG/ACT inhaler Commonly known as: VENTOLIN HFA   metoprolol tartrate 25 MG tablet Commonly known as: LOPRESSOR       TAKE these medications    aspirin 81 MG tablet Take 1 tablet (81 mg total) by mouth daily.   Breztri Aerosphere 160-9-4.8 MCG/ACT Aero Generic drug: Budeson-Glycopyrrol-Formoterol Inhale 2 puffs into the lungs in the morning and at bedtime.   doxazosin 8 MG tablet Commonly known as: CARDURA Take 8 mg by mouth daily.   furosemide 20 MG tablet Commonly known as: LASIX Take 20 mg by mouth daily.   levETIRAcetam 500 MG tablet Commonly known as: KEPPRA  Take 1 tablet (500 mg total) by mouth 2 (two)  times daily.   levothyroxine 88 MCG tablet Commonly known as: SYNTHROID Take 88 mcg by mouth daily before breakfast.   metoprolol succinate 50 MG 24 hr tablet Commonly known as: TOPROL-XL Take 1 tablet (50 mg total) by mouth daily. Take with or immediately following a meal.   OXYGEN Inhale 2-4 L into the lungs as needed. As needed to maintain SATS > 90%   pantoprazole 40 MG tablet Commonly known as: PROTONIX Take 1 tablet (40 mg total) by mouth daily.   rosuvastatin 10 MG tablet Commonly known as: CRESTOR Take 10 mg by mouth daily.        Follow-up Information     Georgianne Fick, MD Follow up in 1 week(s).   Specialty: Internal Medicine Contact information: [address] [place] [address] [place] [address] [phone]                  Time coordinating discharge: 39 minutes  Signed:  Emon Miggins  [facility] 07/18/2023, 11:24 AM

## 2023-07-21 DIAGNOSIS — C50412 Malignant neoplasm of upper-outer quadrant of left female breast: Secondary | ICD-10-CM | POA: Diagnosis not present

## 2023-07-21 DIAGNOSIS — J441 Chronic obstructive pulmonary disease with (acute) exacerbation: Secondary | ICD-10-CM | POA: Diagnosis not present

## 2023-07-21 DIAGNOSIS — I214 Non-ST elevation (NSTEMI) myocardial infarction: Secondary | ICD-10-CM | POA: Diagnosis not present

## 2023-07-21 DIAGNOSIS — E43 Unspecified severe protein-calorie malnutrition: Secondary | ICD-10-CM | POA: Diagnosis not present

## 2023-07-21 DIAGNOSIS — I6529 Occlusion and stenosis of unspecified carotid artery: Secondary | ICD-10-CM | POA: Diagnosis not present

## 2023-07-21 DIAGNOSIS — J449 Chronic obstructive pulmonary disease, unspecified: Secondary | ICD-10-CM | POA: Diagnosis not present

## 2023-07-21 DIAGNOSIS — C3492 Malignant neoplasm of unspecified part of left bronchus or lung: Secondary | ICD-10-CM | POA: Diagnosis not present

## 2023-07-21 DIAGNOSIS — I69351 Hemiplegia and hemiparesis following cerebral infarction affecting right dominant side: Secondary | ICD-10-CM | POA: Diagnosis not present

## 2023-07-21 DIAGNOSIS — I5033 Acute on chronic diastolic (congestive) heart failure: Secondary | ICD-10-CM | POA: Diagnosis not present

## 2023-07-21 DIAGNOSIS — C3491 Malignant neoplasm of unspecified part of right bronchus or lung: Secondary | ICD-10-CM | POA: Diagnosis not present

## 2023-07-21 DIAGNOSIS — I701 Atherosclerosis of renal artery: Secondary | ICD-10-CM | POA: Diagnosis not present

## 2023-07-21 DIAGNOSIS — I619 Nontraumatic intracerebral hemorrhage, unspecified: Secondary | ICD-10-CM | POA: Diagnosis not present

## 2023-07-22 DIAGNOSIS — C3491 Malignant neoplasm of unspecified part of right bronchus or lung: Secondary | ICD-10-CM | POA: Diagnosis not present

## 2023-07-22 DIAGNOSIS — I69351 Hemiplegia and hemiparesis following cerebral infarction affecting right dominant side: Secondary | ICD-10-CM | POA: Diagnosis not present

## 2023-07-22 DIAGNOSIS — J449 Chronic obstructive pulmonary disease, unspecified: Secondary | ICD-10-CM | POA: Diagnosis not present

## 2023-07-22 DIAGNOSIS — I5033 Acute on chronic diastolic (congestive) heart failure: Secondary | ICD-10-CM | POA: Diagnosis not present

## 2023-07-22 DIAGNOSIS — I701 Atherosclerosis of renal artery: Secondary | ICD-10-CM | POA: Diagnosis not present

## 2023-07-22 DIAGNOSIS — C50412 Malignant neoplasm of upper-outer quadrant of left female breast: Secondary | ICD-10-CM | POA: Diagnosis not present

## 2023-07-22 DIAGNOSIS — C3492 Malignant neoplasm of unspecified part of left bronchus or lung: Secondary | ICD-10-CM | POA: Diagnosis not present

## 2023-07-22 DIAGNOSIS — J441 Chronic obstructive pulmonary disease with (acute) exacerbation: Secondary | ICD-10-CM | POA: Diagnosis not present

## 2023-07-22 DIAGNOSIS — E43 Unspecified severe protein-calorie malnutrition: Secondary | ICD-10-CM | POA: Diagnosis not present

## 2023-07-22 DIAGNOSIS — I619 Nontraumatic intracerebral hemorrhage, unspecified: Secondary | ICD-10-CM | POA: Diagnosis not present

## 2023-07-22 DIAGNOSIS — I6529 Occlusion and stenosis of unspecified carotid artery: Secondary | ICD-10-CM | POA: Diagnosis not present

## 2023-07-22 DIAGNOSIS — I214 Non-ST elevation (NSTEMI) myocardial infarction: Secondary | ICD-10-CM | POA: Diagnosis not present

## 2023-07-24 DIAGNOSIS — I5033 Acute on chronic diastolic (congestive) heart failure: Secondary | ICD-10-CM | POA: Diagnosis not present

## 2023-07-24 DIAGNOSIS — I69351 Hemiplegia and hemiparesis following cerebral infarction affecting right dominant side: Secondary | ICD-10-CM | POA: Diagnosis not present

## 2023-07-24 DIAGNOSIS — I619 Nontraumatic intracerebral hemorrhage, unspecified: Secondary | ICD-10-CM | POA: Diagnosis not present

## 2023-07-24 DIAGNOSIS — C50412 Malignant neoplasm of upper-outer quadrant of left female breast: Secondary | ICD-10-CM | POA: Diagnosis not present

## 2023-07-24 DIAGNOSIS — C3492 Malignant neoplasm of unspecified part of left bronchus or lung: Secondary | ICD-10-CM | POA: Diagnosis not present

## 2023-07-24 DIAGNOSIS — C3491 Malignant neoplasm of unspecified part of right bronchus or lung: Secondary | ICD-10-CM | POA: Diagnosis not present

## 2023-07-24 DIAGNOSIS — I701 Atherosclerosis of renal artery: Secondary | ICD-10-CM | POA: Diagnosis not present

## 2023-07-24 DIAGNOSIS — I214 Non-ST elevation (NSTEMI) myocardial infarction: Secondary | ICD-10-CM | POA: Diagnosis not present

## 2023-07-24 DIAGNOSIS — J449 Chronic obstructive pulmonary disease, unspecified: Secondary | ICD-10-CM | POA: Diagnosis not present

## 2023-07-24 DIAGNOSIS — J441 Chronic obstructive pulmonary disease with (acute) exacerbation: Secondary | ICD-10-CM | POA: Diagnosis not present

## 2023-07-24 DIAGNOSIS — E43 Unspecified severe protein-calorie malnutrition: Secondary | ICD-10-CM | POA: Diagnosis not present

## 2023-07-24 DIAGNOSIS — I6529 Occlusion and stenosis of unspecified carotid artery: Secondary | ICD-10-CM | POA: Diagnosis not present

## 2023-07-25 NOTE — Progress Notes (Deleted)
  Cardiology Office Note:  .   Date:  07/25/2023  ID:  Kendra Watts, DOB 1946-03-26, MRN [id number] PCP: Verdia Lombard, MD  [facility] Providers Cardiologist:  Dorn Lesches, MD  }   History of Present Illness: .   Kendra Watts is a 78 y.o. 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  We are seeing her posthospitalization if she was admitted for non-STEMI where she had symptoms of shortness of breath that that been lasting 3 days, she denied chest pain.  She remained on oxygen  at home for chronic cough.  The patient was started on a heparin  drip during hospitalization, and she had no further chest pain or shortness of breath.  EKG did reveal some ST depression and nonspecific T wave changes.  Metoprolol  was increased to 50 mg daily at the time of discharge.  He was also noted that she had a pulmonary nodule that was enlarging to 1.6 cm in the superior segment of the left lower lobe.  She was to follow-up with Dr. Shelah pulmonology as an outpatient.  She would be plan for another CT scan.  She did receive IV Lasix  during hospitalization and transition to oral Lasix , with echocardiogram on 07/12/2023 revealing EF of 55 to 60% with diastolic dysfunction.  ROS: ***  Studies Reviewed: .      Echocardiogram 07/12/2023 1. Left ventricular ejection fraction, by estimation, is 55 to 60%. The  left ventricle has normal function. The left ventricle has no regional  wall motion abnormalities. Left ventricular diastolic parameters are  indeterminate. Elevated left atrial  pressure.   2. Right ventricular systolic function is normal. The right ventricular  size is normal. Tricuspid regurgitation signal is inadequate for assessing  PA pressure.   3. The  mitral valve is normal in structure. No evidence of mitral valve  regurgitation. No evidence of mitral stenosis.   4. The aortic valve is normal in structure. Aortic valve regurgitation is  trivial. No aortic stenosis is present.   5. The inferior vena cava is dilated in size with <50% respiratory  variability, suggesting right atrial pressure of 15 mmHg.    *** EKG Interpretation Date/Time:    Ventricular Rate:    PR Interval:    QRS Duration:    QT Interval:    QTC Calculation:   R Axis:      Text Interpretation:      Physical Exam:   VS:  There were no vitals taken for this visit.   Wt Readings from Last 3 Encounters:  07/11/23 87 lb 14.4 oz (39.9 kg)  05/20/23 81 lb (36.7 kg)  11/18/22 81 lb 8 oz (37 kg)    GEN: Well nourished, well developed in no acute distress NECK: No JVD; No carotid bruits CARDIAC: ***RRR, no murmurs, rubs, gallops RESPIRATORY:  Clear to auscultation without rales, wheezing or rhonchi  ABDOMEN: Soft, non-tender, non-distended EXTREMITIES:  No edema; No deformity   ASSESSMENT AND PLAN: .   ***    {Are you ordering a CV Procedure (e.g. stress test, cath, DCCV, TEE, etc)?   Press F2        :[id number]}    Signed, Lamarr CHRISTELLA. Jerilynn CHOL, ANP, AACC

## 2023-07-28 DIAGNOSIS — I5033 Acute on chronic diastolic (congestive) heart failure: Secondary | ICD-10-CM | POA: Diagnosis not present

## 2023-07-28 DIAGNOSIS — E43 Unspecified severe protein-calorie malnutrition: Secondary | ICD-10-CM | POA: Diagnosis not present

## 2023-07-28 DIAGNOSIS — C3491 Malignant neoplasm of unspecified part of right bronchus or lung: Secondary | ICD-10-CM | POA: Diagnosis not present

## 2023-07-28 DIAGNOSIS — C50412 Malignant neoplasm of upper-outer quadrant of left female breast: Secondary | ICD-10-CM | POA: Diagnosis not present

## 2023-07-28 DIAGNOSIS — I619 Nontraumatic intracerebral hemorrhage, unspecified: Secondary | ICD-10-CM | POA: Diagnosis not present

## 2023-07-28 DIAGNOSIS — J449 Chronic obstructive pulmonary disease, unspecified: Secondary | ICD-10-CM | POA: Diagnosis not present

## 2023-07-28 DIAGNOSIS — J441 Chronic obstructive pulmonary disease with (acute) exacerbation: Secondary | ICD-10-CM | POA: Diagnosis not present

## 2023-07-28 DIAGNOSIS — I69351 Hemiplegia and hemiparesis following cerebral infarction affecting right dominant side: Secondary | ICD-10-CM | POA: Diagnosis not present

## 2023-07-28 DIAGNOSIS — C3492 Malignant neoplasm of unspecified part of left bronchus or lung: Secondary | ICD-10-CM | POA: Diagnosis not present

## 2023-07-28 DIAGNOSIS — I214 Non-ST elevation (NSTEMI) myocardial infarction: Secondary | ICD-10-CM | POA: Diagnosis not present

## 2023-07-28 DIAGNOSIS — I701 Atherosclerosis of renal artery: Secondary | ICD-10-CM | POA: Diagnosis not present

## 2023-07-28 DIAGNOSIS — I6529 Occlusion and stenosis of unspecified carotid artery: Secondary | ICD-10-CM | POA: Diagnosis not present

## 2023-07-29 ENCOUNTER — Ambulatory Visit: Payer: Medicare Other | Attending: Adult Health | Admitting: Adult Health

## 2023-07-30 ENCOUNTER — Other Ambulatory Visit: Payer: Medicare Other

## 2023-07-30 DIAGNOSIS — I69351 Hemiplegia and hemiparesis following cerebral infarction affecting right dominant side: Secondary | ICD-10-CM | POA: Diagnosis not present

## 2023-07-30 DIAGNOSIS — C50412 Malignant neoplasm of upper-outer quadrant of left female breast: Secondary | ICD-10-CM | POA: Diagnosis not present

## 2023-07-30 DIAGNOSIS — J441 Chronic obstructive pulmonary disease with (acute) exacerbation: Secondary | ICD-10-CM | POA: Diagnosis not present

## 2023-07-30 DIAGNOSIS — C3491 Malignant neoplasm of unspecified part of right bronchus or lung: Secondary | ICD-10-CM | POA: Diagnosis not present

## 2023-07-30 DIAGNOSIS — C3492 Malignant neoplasm of unspecified part of left bronchus or lung: Secondary | ICD-10-CM | POA: Diagnosis not present

## 2023-07-30 DIAGNOSIS — E43 Unspecified severe protein-calorie malnutrition: Secondary | ICD-10-CM | POA: Diagnosis not present

## 2023-07-30 DIAGNOSIS — I214 Non-ST elevation (NSTEMI) myocardial infarction: Secondary | ICD-10-CM | POA: Diagnosis not present

## 2023-07-30 DIAGNOSIS — J449 Chronic obstructive pulmonary disease, unspecified: Secondary | ICD-10-CM | POA: Diagnosis not present

## 2023-07-30 DIAGNOSIS — I5033 Acute on chronic diastolic (congestive) heart failure: Secondary | ICD-10-CM | POA: Diagnosis not present

## 2023-07-30 DIAGNOSIS — I701 Atherosclerosis of renal artery: Secondary | ICD-10-CM | POA: Diagnosis not present

## 2023-07-30 DIAGNOSIS — I619 Nontraumatic intracerebral hemorrhage, unspecified: Secondary | ICD-10-CM | POA: Diagnosis not present

## 2023-07-30 DIAGNOSIS — I6529 Occlusion and stenosis of unspecified carotid artery: Secondary | ICD-10-CM | POA: Diagnosis not present

## 2023-08-04 DIAGNOSIS — I214 Non-ST elevation (NSTEMI) myocardial infarction: Secondary | ICD-10-CM | POA: Diagnosis not present

## 2023-08-04 DIAGNOSIS — I6529 Occlusion and stenosis of unspecified carotid artery: Secondary | ICD-10-CM | POA: Diagnosis not present

## 2023-08-04 DIAGNOSIS — E43 Unspecified severe protein-calorie malnutrition: Secondary | ICD-10-CM | POA: Diagnosis not present

## 2023-08-04 DIAGNOSIS — C3491 Malignant neoplasm of unspecified part of right bronchus or lung: Secondary | ICD-10-CM | POA: Diagnosis not present

## 2023-08-04 DIAGNOSIS — C3492 Malignant neoplasm of unspecified part of left bronchus or lung: Secondary | ICD-10-CM | POA: Diagnosis not present

## 2023-08-04 DIAGNOSIS — I69351 Hemiplegia and hemiparesis following cerebral infarction affecting right dominant side: Secondary | ICD-10-CM | POA: Diagnosis not present

## 2023-08-04 DIAGNOSIS — I619 Nontraumatic intracerebral hemorrhage, unspecified: Secondary | ICD-10-CM | POA: Diagnosis not present

## 2023-08-04 DIAGNOSIS — I5033 Acute on chronic diastolic (congestive) heart failure: Secondary | ICD-10-CM | POA: Diagnosis not present

## 2023-08-04 DIAGNOSIS — J449 Chronic obstructive pulmonary disease, unspecified: Secondary | ICD-10-CM | POA: Diagnosis not present

## 2023-08-04 DIAGNOSIS — J441 Chronic obstructive pulmonary disease with (acute) exacerbation: Secondary | ICD-10-CM | POA: Diagnosis not present

## 2023-08-04 DIAGNOSIS — I701 Atherosclerosis of renal artery: Secondary | ICD-10-CM | POA: Diagnosis not present

## 2023-08-04 DIAGNOSIS — C50412 Malignant neoplasm of upper-outer quadrant of left female breast: Secondary | ICD-10-CM | POA: Diagnosis not present

## 2023-08-06 DIAGNOSIS — C50412 Malignant neoplasm of upper-outer quadrant of left female breast: Secondary | ICD-10-CM | POA: Diagnosis not present

## 2023-08-06 DIAGNOSIS — I214 Non-ST elevation (NSTEMI) myocardial infarction: Secondary | ICD-10-CM | POA: Diagnosis not present

## 2023-08-06 DIAGNOSIS — I619 Nontraumatic intracerebral hemorrhage, unspecified: Secondary | ICD-10-CM | POA: Diagnosis not present

## 2023-08-06 DIAGNOSIS — E43 Unspecified severe protein-calorie malnutrition: Secondary | ICD-10-CM | POA: Diagnosis not present

## 2023-08-06 DIAGNOSIS — J449 Chronic obstructive pulmonary disease, unspecified: Secondary | ICD-10-CM | POA: Diagnosis not present

## 2023-08-06 DIAGNOSIS — J441 Chronic obstructive pulmonary disease with (acute) exacerbation: Secondary | ICD-10-CM | POA: Diagnosis not present

## 2023-08-06 DIAGNOSIS — C3491 Malignant neoplasm of unspecified part of right bronchus or lung: Secondary | ICD-10-CM | POA: Diagnosis not present

## 2023-08-06 DIAGNOSIS — C3492 Malignant neoplasm of unspecified part of left bronchus or lung: Secondary | ICD-10-CM | POA: Diagnosis not present

## 2023-08-06 DIAGNOSIS — I5033 Acute on chronic diastolic (congestive) heart failure: Secondary | ICD-10-CM | POA: Diagnosis not present

## 2023-08-06 DIAGNOSIS — I69351 Hemiplegia and hemiparesis following cerebral infarction affecting right dominant side: Secondary | ICD-10-CM | POA: Diagnosis not present

## 2023-08-06 DIAGNOSIS — I6529 Occlusion and stenosis of unspecified carotid artery: Secondary | ICD-10-CM | POA: Diagnosis not present

## 2023-08-06 DIAGNOSIS — I701 Atherosclerosis of renal artery: Secondary | ICD-10-CM | POA: Diagnosis not present

## 2023-08-07 ENCOUNTER — Ambulatory Visit: Payer: Medicare Other | Admitting: Emergency Medicine

## 2023-08-07 ENCOUNTER — Encounter: Payer: Self-pay | Admitting: Emergency Medicine

## 2023-08-07 VITALS — BP 76/47 | HR 72 | Ht 60.0 in | Wt 86.8 lb

## 2023-08-07 DIAGNOSIS — J9611 Chronic respiratory failure with hypoxia: Secondary | ICD-10-CM

## 2023-08-07 DIAGNOSIS — R918 Other nonspecific abnormal finding of lung field: Secondary | ICD-10-CM | POA: Diagnosis not present

## 2023-08-07 DIAGNOSIS — J449 Chronic obstructive pulmonary disease, unspecified: Secondary | ICD-10-CM | POA: Diagnosis not present

## 2023-08-07 NOTE — Patient Instructions (Signed)
Please continue to take Breztri 2 puffs twice a day.  Rinse and gargle after using. Use albuterol either 2 puffs or 1 nebulizer treatment up to every 4 hours if needed for shortness of breath, chest tightness, wheezing. Continue your oxygen at 3 L/min at rest.  You may need to turn this up to 4 L/min when you are up exerting yourself.  Our goal is to keep your saturations > 90%. We reviewed your CT scan of the chest from your recent hospitalization. We will perform a PET scan to further evaluate pulmonary nodules in the chest. Follow with Dr. Delton Coombes next available after your PET scan so we can discuss the results and plan next steps in your care

## 2023-08-07 NOTE — Assessment & Plan Note (Signed)
Multiple pulmonary nodules and a longtime smoker.  She has been treated for non-small cell lung cancer bilaterally with SBRT.  She now has a clearly enlarging left lower lobe pulmonary nodule that is suspicious for malignancy.  She also has some increased opacity in the regions that were treated in her bilateral upper lobes of unclear significance.  I think it would be reasonable to get a PET scan to better characterize and then talk about whether treatment is feasible.  I do not think she can handle bronchoscopy, I do not think she would be able to handle any other therapy other than more SBRT to the left lower lobe nodule.  Am quite concerned about her overall condition and debilitation.  She is residing at [facility] and is dependent for her care, does not really recall the events from her hospitalization or her CT scan results.  She has a sister in [place] but no one else local except for some family friends.  Depending on the PET scan we may be able to pursue referral back to radiation oncology or it might also be reasonable to consider a transition to palliative care and hospice.  I will follow-up with her to review the scan and decide next steps with her.  Suspect we will need to also involve her sister as her next of kin.

## 2023-08-07 NOTE — Assessment & Plan Note (Signed)
Borderline saturations today on 3 L/min.  Discussed uptitrating for SpO2 goal 90%

## 2023-08-07 NOTE — Assessment & Plan Note (Signed)
She is not currently smoking, stopped 3 weeks ago when she was hospitalized.  I congratulated her on this.  She does have severe disease and is significantly debilitated.  Continue her current bronchodilator regimen.  Could consider prednisone at some point going forward.

## 2023-08-07 NOTE — Progress Notes (Signed)
Subjective:    Patient ID: Kendra Watts, female    DOB: May 10, 1946, 78 y.o.   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  She has chronic hypoxemic respiratory failure on 3 L/min.  We have followed other pulmonary nodules on CT chest including a 1 cm right lower lobe pulmonary nodule that was stable up until 01/2021.  Her most recent were done 01/15/2023 and then again 07/11/2023 when she was admitted to the hospital for myocardial infarction. She is having poor memory, cannot really tell me the details of her hospitalization. She is on Breztri, uses albuterol nebs but she cannot tell me how often. She has a sister in [place], no one local.   CT chest 01/15/2023 showed 9 x 8 mm superior segmental left lower lobe pulmonary nodule that may have been slightly increased in size, 5 mm right lower lobe pulmonary nodule that was stable, scattered other stable pulmonary nodules.  CT-PA 07/11/2023 was performed 07/11/2023 and reviewed by me, shows no mediastinal or hilar adenopathy, mild to moderate centrilobular emphysema, chronic upper lobe radiation changes and fiducial markers.  1.8 x 1.7 cm area of nodular cavitation at the right apex.  Her left lower lobe superior segmental nodule has increased in size to 1.6 x 1.0 cm compared with June   Review of Systems As above      Objective:   Physical Exam Vitals:   08/07/23 1325  BP: (!) 76/47  Pulse: 72  SpO2: (!) 82%  Weight: 86 lb 12.8 oz (39.4 kg)  Height: 5' (1.524 m)   Gen: Pleasant, thin, in no distress,  normal affect  ENT: No lesions,  mouth clear, some mild while film posterior tongue  Neck: No JVD, no stridor  Lungs: No use of accessory muscles, distant, no wheeze on a normal breath, soft exp wheeze on forced exp  Cardiovascular: RRR, heart sounds normal, no murmur or gallops, no peripheral  edema  Musculoskeletal: No deformities, no cyanosis or clubbing  Neuro: awake, oriented to self and situation, but poor recall about medical issues, meds.   Skin: Warm, no lesions or rash      Assessment & Plan:  Multiple lung nodules Multiple pulmonary nodules and a longtime smoker.  She has been treated for non-small cell lung cancer bilaterally with SBRT.  She now has a clearly enlarging left lower lobe pulmonary nodule that is suspicious for malignancy.  She also has some increased opacity in the regions that were treated in her bilateral upper lobes of unclear significance.  I think it would be reasonable to get a PET scan to better characterize and then talk about whether treatment is feasible.  I do not think she can handle bronchoscopy, I do not think she would be able to handle any other therapy other than more SBRT to the left lower lobe nodule.  Am quite concerned about her overall condition and debilitation.  She is residing at [facility] and is dependent for her care, does not really recall the events from her hospitalization or her CT scan results.  She has a sister in [place] but no one else local except for some family friends.  Depending on the PET scan we may be able to pursue referral back to radiation oncology or it might also be reasonable to consider a transition to palliative care and hospice.  I  will follow-up with her to review the scan and decide next steps with her.  Suspect we will need to also involve her sister as her next of kin.  Chronic obstructive pulmonary disease (HCC) She is not currently smoking, stopped 3 weeks ago when she was hospitalized.  I congratulated her on this.  She does have severe disease and is significantly debilitated.  Continue her current bronchodilator regimen.  Could consider prednisone at some point going forward.  Chronic respiratory failure with hypoxia (HCC) Borderline saturations today on 3 L/min.  Discussed uptitrating for SpO2 goal  90%   Levy Pupa, MD, PhD 08/07/2023, 1:50 PM [facility] [phone] or if no answer [phone]

## 2023-08-08 ENCOUNTER — Emergency Department (HOSPITAL_COMMUNITY): Payer: Medicare Other

## 2023-08-08 ENCOUNTER — Other Ambulatory Visit: Payer: Self-pay

## 2023-08-08 ENCOUNTER — Encounter (HOSPITAL_COMMUNITY): Payer: Self-pay | Admitting: *Deleted

## 2023-08-08 ENCOUNTER — Encounter (HOSPITAL_COMMUNITY): Payer: Medicare Other

## 2023-08-08 ENCOUNTER — Inpatient Hospital Stay (HOSPITAL_COMMUNITY)
Admission: EM | Admit: 2023-08-08 | Discharge: 2023-08-18 | DRG: 208 | Disposition: A | Discharge status: Skilled Nursing Facility | Payer: Medicare Other | Attending: Internal Medicine | Admitting: Internal Medicine

## 2023-08-08 ENCOUNTER — Inpatient Hospital Stay (HOSPITAL_COMMUNITY): Payer: Medicare Other

## 2023-08-08 DIAGNOSIS — R410 Disorientation, unspecified: Secondary | ICD-10-CM | POA: Diagnosis not present

## 2023-08-08 DIAGNOSIS — J449 Chronic obstructive pulmonary disease, unspecified: Secondary | ICD-10-CM | POA: Diagnosis present

## 2023-08-08 DIAGNOSIS — J9621 Acute and chronic respiratory failure with hypoxia: Secondary | ICD-10-CM | POA: Diagnosis not present

## 2023-08-08 DIAGNOSIS — Z9981 Dependence on supplemental oxygen: Secondary | ICD-10-CM | POA: Diagnosis not present

## 2023-08-08 DIAGNOSIS — J9601 Acute respiratory failure with hypoxia: Secondary | ICD-10-CM | POA: Diagnosis not present

## 2023-08-08 DIAGNOSIS — C3491 Malignant neoplasm of unspecified part of right bronchus or lung: Secondary | ICD-10-CM | POA: Diagnosis not present

## 2023-08-08 DIAGNOSIS — E039 Hypothyroidism, unspecified: Secondary | ICD-10-CM | POA: Diagnosis not present

## 2023-08-08 DIAGNOSIS — Z79890 Hormone replacement therapy (postmenopausal): Secondary | ICD-10-CM | POA: Diagnosis not present

## 2023-08-08 DIAGNOSIS — D500 Iron deficiency anemia secondary to blood loss (chronic): Secondary | ICD-10-CM | POA: Diagnosis present

## 2023-08-08 DIAGNOSIS — R262 Difficulty in walking, not elsewhere classified: Secondary | ICD-10-CM | POA: Diagnosis not present

## 2023-08-08 DIAGNOSIS — M6250 Muscle wasting and atrophy, not elsewhere classified, unspecified site: Secondary | ICD-10-CM | POA: Diagnosis present

## 2023-08-08 DIAGNOSIS — E785 Hyperlipidemia, unspecified: Secondary | ICD-10-CM | POA: Diagnosis not present

## 2023-08-08 DIAGNOSIS — R451 Restlessness and agitation: Secondary | ICD-10-CM | POA: Diagnosis not present

## 2023-08-08 DIAGNOSIS — Z66 Do not resuscitate: Secondary | ICD-10-CM | POA: Diagnosis present

## 2023-08-08 DIAGNOSIS — Z4682 Encounter for fitting and adjustment of non-vascular catheter: Secondary | ICD-10-CM | POA: Diagnosis not present

## 2023-08-08 DIAGNOSIS — C50412 Malignant neoplasm of upper-outer quadrant of left female breast: Secondary | ICD-10-CM | POA: Diagnosis not present

## 2023-08-08 DIAGNOSIS — K219 Gastro-esophageal reflux disease without esophagitis: Secondary | ICD-10-CM | POA: Diagnosis not present

## 2023-08-08 DIAGNOSIS — R569 Unspecified convulsions: Secondary | ICD-10-CM

## 2023-08-08 DIAGNOSIS — I959 Hypotension, unspecified: Secondary | ICD-10-CM | POA: Diagnosis present

## 2023-08-08 DIAGNOSIS — J9622 Acute and chronic respiratory failure with hypercapnia: Secondary | ICD-10-CM | POA: Diagnosis present

## 2023-08-08 DIAGNOSIS — Z85118 Personal history of other malignant neoplasm of bronchus and lung: Secondary | ICD-10-CM

## 2023-08-08 DIAGNOSIS — R0902 Hypoxemia: Secondary | ICD-10-CM | POA: Diagnosis not present

## 2023-08-08 DIAGNOSIS — R627 Adult failure to thrive: Secondary | ICD-10-CM | POA: Diagnosis present

## 2023-08-08 DIAGNOSIS — Z681 Body mass index (BMI) 19 or less, adult: Secondary | ICD-10-CM | POA: Diagnosis not present

## 2023-08-08 DIAGNOSIS — I110 Hypertensive heart disease with heart failure: Secondary | ICD-10-CM | POA: Diagnosis present

## 2023-08-08 DIAGNOSIS — Z7982 Long term (current) use of aspirin: Secondary | ICD-10-CM

## 2023-08-08 DIAGNOSIS — Z79899 Other long term (current) drug therapy: Secondary | ICD-10-CM | POA: Diagnosis not present

## 2023-08-08 DIAGNOSIS — R1312 Dysphagia, oropharyngeal phase: Secondary | ICD-10-CM | POA: Diagnosis not present

## 2023-08-08 DIAGNOSIS — R4182 Altered mental status, unspecified: Secondary | ICD-10-CM | POA: Diagnosis present

## 2023-08-08 DIAGNOSIS — I5032 Chronic diastolic (congestive) heart failure: Secondary | ICD-10-CM | POA: Diagnosis not present

## 2023-08-08 DIAGNOSIS — R911 Solitary pulmonary nodule: Secondary | ICD-10-CM | POA: Diagnosis not present

## 2023-08-08 DIAGNOSIS — Z515 Encounter for palliative care: Secondary | ICD-10-CM | POA: Diagnosis not present

## 2023-08-08 DIAGNOSIS — J690 Pneumonitis due to inhalation of food and vomit: Principal | ICD-10-CM | POA: Diagnosis present

## 2023-08-08 DIAGNOSIS — C3492 Malignant neoplasm of unspecified part of left bronchus or lung: Secondary | ICD-10-CM | POA: Diagnosis not present

## 2023-08-08 DIAGNOSIS — I69351 Hemiplegia and hemiparesis following cerebral infarction affecting right dominant side: Secondary | ICD-10-CM | POA: Diagnosis not present

## 2023-08-08 DIAGNOSIS — I6782 Cerebral ischemia: Secondary | ICD-10-CM | POA: Diagnosis not present

## 2023-08-08 DIAGNOSIS — E876 Hypokalemia: Secondary | ICD-10-CM | POA: Diagnosis not present

## 2023-08-08 DIAGNOSIS — Z8249 Family history of ischemic heart disease and other diseases of the circulatory system: Secondary | ICD-10-CM

## 2023-08-08 DIAGNOSIS — R531 Weakness: Secondary | ICD-10-CM | POA: Diagnosis not present

## 2023-08-08 DIAGNOSIS — I739 Peripheral vascular disease, unspecified: Secondary | ICD-10-CM | POA: Diagnosis present

## 2023-08-08 DIAGNOSIS — Z781 Physical restraint status: Secondary | ICD-10-CM

## 2023-08-08 DIAGNOSIS — J9611 Chronic respiratory failure with hypoxia: Secondary | ICD-10-CM | POA: Diagnosis not present

## 2023-08-08 DIAGNOSIS — R509 Fever, unspecified: Secondary | ICD-10-CM | POA: Diagnosis not present

## 2023-08-08 DIAGNOSIS — I214 Non-ST elevation (NSTEMI) myocardial infarction: Secondary | ICD-10-CM | POA: Diagnosis not present

## 2023-08-08 DIAGNOSIS — Z7401 Bed confinement status: Secondary | ICD-10-CM | POA: Diagnosis not present

## 2023-08-08 DIAGNOSIS — R0603 Acute respiratory distress: Secondary | ICD-10-CM | POA: Diagnosis not present

## 2023-08-08 DIAGNOSIS — I701 Atherosclerosis of renal artery: Secondary | ICD-10-CM | POA: Diagnosis present

## 2023-08-08 DIAGNOSIS — J189 Pneumonia, unspecified organism: Secondary | ICD-10-CM | POA: Diagnosis not present

## 2023-08-08 DIAGNOSIS — R195 Other fecal abnormalities: Secondary | ICD-10-CM | POA: Diagnosis not present

## 2023-08-08 DIAGNOSIS — I252 Old myocardial infarction: Secondary | ICD-10-CM

## 2023-08-08 DIAGNOSIS — G9341 Metabolic encephalopathy: Secondary | ICD-10-CM | POA: Diagnosis not present

## 2023-08-08 DIAGNOSIS — Z853 Personal history of malignant neoplasm of breast: Secondary | ICD-10-CM

## 2023-08-08 DIAGNOSIS — E43 Unspecified severe protein-calorie malnutrition: Secondary | ICD-10-CM | POA: Diagnosis not present

## 2023-08-08 DIAGNOSIS — F418 Other specified anxiety disorders: Secondary | ICD-10-CM | POA: Diagnosis not present

## 2023-08-08 DIAGNOSIS — K921 Melena: Secondary | ICD-10-CM | POA: Diagnosis not present

## 2023-08-08 DIAGNOSIS — M6281 Muscle weakness (generalized): Secondary | ICD-10-CM | POA: Diagnosis not present

## 2023-08-08 DIAGNOSIS — J90 Pleural effusion, not elsewhere classified: Secondary | ICD-10-CM | POA: Diagnosis not present

## 2023-08-08 DIAGNOSIS — Z8673 Personal history of transient ischemic attack (TIA), and cerebral infarction without residual deficits: Secondary | ICD-10-CM

## 2023-08-08 DIAGNOSIS — J9600 Acute respiratory failure, unspecified whether with hypoxia or hypercapnia: Principal | ICD-10-CM

## 2023-08-08 DIAGNOSIS — Z801 Family history of malignant neoplasm of trachea, bronchus and lung: Secondary | ICD-10-CM

## 2023-08-08 DIAGNOSIS — I69319 Unspecified symptoms and signs involving cognitive functions following cerebral infarction: Secondary | ICD-10-CM | POA: Diagnosis not present

## 2023-08-08 DIAGNOSIS — G9389 Other specified disorders of brain: Secondary | ICD-10-CM | POA: Diagnosis not present

## 2023-08-08 DIAGNOSIS — R918 Other nonspecific abnormal finding of lung field: Secondary | ICD-10-CM | POA: Diagnosis not present

## 2023-08-08 DIAGNOSIS — D638 Anemia in other chronic diseases classified elsewhere: Secondary | ICD-10-CM | POA: Diagnosis present

## 2023-08-08 DIAGNOSIS — I5033 Acute on chronic diastolic (congestive) heart failure: Secondary | ICD-10-CM | POA: Diagnosis not present

## 2023-08-08 DIAGNOSIS — M6259 Muscle wasting and atrophy, not elsewhere classified, multiple sites: Secondary | ICD-10-CM | POA: Diagnosis not present

## 2023-08-08 DIAGNOSIS — F17210 Nicotine dependence, cigarettes, uncomplicated: Secondary | ICD-10-CM | POA: Diagnosis not present

## 2023-08-08 DIAGNOSIS — C50919 Malignant neoplasm of unspecified site of unspecified female breast: Secondary | ICD-10-CM | POA: Diagnosis not present

## 2023-08-08 DIAGNOSIS — R062 Wheezing: Secondary | ICD-10-CM | POA: Diagnosis not present

## 2023-08-08 DIAGNOSIS — I6529 Occlusion and stenosis of unspecified carotid artery: Secondary | ICD-10-CM | POA: Diagnosis not present

## 2023-08-08 DIAGNOSIS — R000 Tachycardia, unspecified: Secondary | ICD-10-CM | POA: Diagnosis not present

## 2023-08-08 DIAGNOSIS — F32A Depression, unspecified: Secondary | ICD-10-CM | POA: Diagnosis not present

## 2023-08-08 DIAGNOSIS — Z923 Personal history of irradiation: Secondary | ICD-10-CM

## 2023-08-08 DIAGNOSIS — I619 Nontraumatic intracerebral hemorrhage, unspecified: Secondary | ICD-10-CM | POA: Diagnosis not present

## 2023-08-08 DIAGNOSIS — Z7189 Other specified counseling: Secondary | ICD-10-CM | POA: Diagnosis not present

## 2023-08-08 DIAGNOSIS — J441 Chronic obstructive pulmonary disease with (acute) exacerbation: Secondary | ICD-10-CM | POA: Diagnosis not present

## 2023-08-08 DIAGNOSIS — R54 Age-related physical debility: Secondary | ICD-10-CM | POA: Diagnosis present

## 2023-08-08 DIAGNOSIS — R41841 Cognitive communication deficit: Secondary | ICD-10-CM | POA: Diagnosis not present

## 2023-08-08 LAB — POCT I-STAT 7, (LYTES, BLD GAS, ICA,H+H)
Acid-Base Excess: 19 mmol/L — ABNORMAL HIGH (ref 0.0–2.0)
Bicarbonate: 47.5 mmol/L — ABNORMAL HIGH (ref 20.0–28.0)
Calcium, Ion: 1.18 mmol/L (ref 1.15–1.40)
HCT: 26 % — ABNORMAL LOW (ref 36.0–46.0)
Hemoglobin: 8.8 g/dL — ABNORMAL LOW (ref 12.0–15.0)
O2 Saturation: 100 %
Patient temperature: 97.5
Potassium: 3.9 mmol/L (ref 3.5–5.1)
Sodium: 140 mmol/L (ref 135–145)
TCO2: >50 mmol/L — ABNORMAL HIGH (ref 22–32)
pCO2 arterial: 88.5 mm[Hg] (ref 32–48)
pH, Arterial: 7.335 — ABNORMAL LOW (ref 7.35–7.45)
pO2, Arterial: 489 mm[Hg] — ABNORMAL HIGH (ref 83–108)

## 2023-08-08 LAB — CBC
HCT: 26.1 % — ABNORMAL LOW (ref 36.0–46.0)
Hemoglobin: 7.5 g/dL — ABNORMAL LOW (ref 12.0–15.0)
MCH: 27.2 pg (ref 26.0–34.0)
MCHC: 28.7 g/dL — ABNORMAL LOW (ref 30.0–36.0)
MCV: 94.6 fL (ref 80.0–100.0)
Platelets: 284 10*3/uL (ref 150–400)
RBC: 2.76 MIL/uL — ABNORMAL LOW (ref 3.87–5.11)
RDW: 16.6 % — ABNORMAL HIGH (ref 11.5–15.5)
WBC: 12.7 10*3/uL — ABNORMAL HIGH (ref 4.0–10.5)
nRBC: 0 % (ref 0.0–0.2)

## 2023-08-08 LAB — RESP PANEL BY RT-PCR (RSV, FLU A&B, COVID)  RVPGX2
Influenza A by PCR: NEGATIVE
Influenza B by PCR: NEGATIVE
Resp Syncytial Virus by PCR: NEGATIVE
SARS Coronavirus 2 by RT PCR: NEGATIVE

## 2023-08-08 LAB — COMPREHENSIVE METABOLIC PANEL
ALT: 39 U/L (ref 0–44)
AST: 36 U/L (ref 15–41)
Albumin: 2.4 g/dL — ABNORMAL LOW (ref 3.5–5.0)
Alkaline Phosphatase: 69 U/L (ref 38–126)
Anion gap: 14 (ref 5–15)
BUN: 13 mg/dL (ref 8–23)
CO2: 38 mmol/L — ABNORMAL HIGH (ref 22–32)
Calcium: 8.7 mg/dL — ABNORMAL LOW (ref 8.9–10.3)
Chloride: 90 mmol/L — ABNORMAL LOW (ref 98–111)
Creatinine, Ser: 0.84 mg/dL (ref 0.44–1.00)
GFR, Estimated: >60 mL/min (ref >60)
Glucose, Bld: 164 mg/dL — ABNORMAL HIGH (ref 70–99)
Potassium: 3.9 mmol/L (ref 3.5–5.1)
Sodium: 142 mmol/L (ref 135–145)
Total Bilirubin: 0.3 mg/dL (ref 0.0–1.2)
Total Protein: 5.5 g/dL — ABNORMAL LOW (ref 6.5–8.1)

## 2023-08-08 LAB — TROPONIN I (HIGH SENSITIVITY)
Troponin I (High Sensitivity): 47 ng/L — ABNORMAL HIGH (ref <18)
Troponin I (High Sensitivity): 38 ng/L — ABNORMAL HIGH (ref <18)

## 2023-08-08 LAB — GLUCOSE, CAPILLARY
Glucose-Capillary: 92 mg/dL (ref 70–99)
Glucose-Capillary: 94 mg/dL (ref 70–99)
Glucose-Capillary: 96 mg/dL (ref 70–99)

## 2023-08-08 LAB — LACTIC ACID, PLASMA
Lactic Acid, Venous: 3.8 mmol/L (ref 0.5–1.9)
Lactic Acid, Venous: 2.2 mmol/L (ref 0.5–1.9)

## 2023-08-08 LAB — CK: Total CK: 30 U/L — ABNORMAL LOW (ref 38–234)

## 2023-08-08 LAB — PROTIME-INR
INR: 1.1 (ref 0.8–1.2)
Prothrombin Time: 14.8 s (ref 11.4–15.2)

## 2023-08-08 LAB — BRAIN NATRIURETIC PEPTIDE: B Natriuretic Peptide: 582.8 pg/mL — ABNORMAL HIGH (ref 0.0–100.0)

## 2023-08-08 LAB — MRSA NEXT GEN BY PCR, NASAL: MRSA by PCR Next Gen: NOT DETECTED

## 2023-08-08 MED ORDER — POLYETHYLENE GLYCOL 3350 17 G PO PACK: 17.0000 g | PACK | Freq: Every day | ORAL | Status: DC | PRN

## 2023-08-08 MED ORDER — PIPERACILLIN-TAZOBACTAM 3.375 G IVPB 30 MIN
3.3750 g | Freq: Once | INTRAVENOUS | Status: AC
  Administered 2023-08-08: 3.375 g via INTRAVENOUS
  Filled 2023-08-08: qty 50

## 2023-08-08 MED ORDER — REVEFENACIN 175 MCG/3ML IN SOLN
175.0000 ug | Freq: Every day | RESPIRATORY_TRACT | Status: DC
Start: 2023-08-08 — End: 2023-08-18
  Administered 2023-08-08 – 2023-08-18 (×10): 175 ug via RESPIRATORY_TRACT
  Filled 2023-08-08 (×11): qty 3

## 2023-08-08 MED ORDER — DOCUSATE SODIUM 100 MG PO CAPS: 100.0000 mg | ORAL_CAPSULE | Freq: Two times a day (BID) | ORAL | Status: DC | PRN

## 2023-08-08 MED ORDER — LEVOTHYROXINE SODIUM 88 MCG PO TABS
88.0000 ug | ORAL_TABLET | Freq: Every day | ORAL | Status: DC
  Administered 2023-08-10 – 2023-08-18 (×8): 88 ug via ORAL
  Filled 2023-08-08 (×9): qty 1

## 2023-08-08 MED ORDER — ARFORMOTEROL TARTRATE 15 MCG/2ML IN NEBU
15.0000 ug | INHALATION_SOLUTION | Freq: Two times a day (BID) | RESPIRATORY_TRACT | Status: DC
  Administered 2023-08-08 – 2023-08-18 (×18): 15 ug via RESPIRATORY_TRACT
  Filled 2023-08-08 (×20): qty 2

## 2023-08-08 MED ORDER — PROPOFOL 1000 MG/100ML IV EMUL: 0.0000 ug/kg/min | INTRAVENOUS | Status: DC

## 2023-08-08 MED ORDER — VALPROATE SODIUM 100 MG/ML IV SOLN
800.0000 mg | Freq: Once | INTRAVENOUS | Status: AC
  Administered 2023-08-08: 800 mg via INTRAVENOUS
  Filled 2023-08-08: qty 8

## 2023-08-08 MED ORDER — DOCUSATE SODIUM 100 MG PO CAPS
100.0000 mg | ORAL_CAPSULE | Freq: Two times a day (BID) | ORAL | Status: DC
  Administered 2023-08-08 – 2023-08-18 (×13): 100 mg via ORAL
  Filled 2023-08-08 (×16): qty 1

## 2023-08-08 MED ORDER — METOPROLOL SUCCINATE ER 50 MG PO TB24: 50.0000 mg | ORAL_TABLET | Freq: Every day | ORAL | Status: DC

## 2023-08-08 MED ORDER — ROSUVASTATIN CALCIUM 5 MG PO TABS: 10.0000 mg | ORAL_TABLET | Freq: Every day | ORAL | Status: DC

## 2023-08-08 MED ORDER — LEVETIRACETAM IN NACL 1500 MG/100ML IV SOLN
1500.0000 mg | Freq: Once | INTRAVENOUS | Status: AC
  Administered 2023-08-08: 1500 mg via INTRAVENOUS
  Filled 2023-08-08: qty 100

## 2023-08-08 MED ORDER — FENTANYL CITRATE PF 50 MCG/ML IJ SOSY
50.0000 ug | PREFILLED_SYRINGE | Freq: Once | INTRAMUSCULAR | Status: DC
Start: 2023-08-08 — End: 2023-08-08

## 2023-08-08 MED ORDER — FENTANYL CITRATE PF 50 MCG/ML IJ SOSY
50.0000 ug | PREFILLED_SYRINGE | Freq: Once | INTRAMUSCULAR | Status: AC
  Administered 2023-08-08: 50 ug via INTRAVENOUS
  Filled 2023-08-08: qty 1

## 2023-08-08 MED ORDER — ONDANSETRON HCL 4 MG/2ML IJ SOLN: 4.0000 mg | Freq: Four times a day (QID) | INTRAMUSCULAR | Status: DC | PRN

## 2023-08-08 MED ORDER — LEVETIRACETAM 500 MG PO TABS
500.0000 mg | ORAL_TABLET | Freq: Two times a day (BID) | ORAL | Status: DC
Start: 2023-08-08 — End: 2023-08-08

## 2023-08-08 MED ORDER — SUCCINYLCHOLINE CHLORIDE 20 MG/ML IJ SOLN
INTRAMUSCULAR | Status: DC | PRN
  Administered 2023-08-08: 150 mg via INTRAVENOUS

## 2023-08-08 MED ORDER — ETOMIDATE 2 MG/ML IV SOLN
INTRAVENOUS | Status: DC | PRN
  Administered 2023-08-08: 15 mg via INTRAVENOUS

## 2023-08-08 MED ORDER — BUDESONIDE 0.5 MG/2ML IN SUSP
0.5000 mg | Freq: Two times a day (BID) | RESPIRATORY_TRACT | Status: DC
  Administered 2023-08-08 – 2023-08-18 (×18): 0.5 mg via RESPIRATORY_TRACT
  Filled 2023-08-08 (×20): qty 2

## 2023-08-08 MED ORDER — POLYETHYLENE GLYCOL 3350 17 G PO PACK: 17.0000 g | PACK | Freq: Every day | ORAL | Status: DC

## 2023-08-08 MED ORDER — PIPERACILLIN-TAZOBACTAM 3.375 G IVPB
3.3750 g | Freq: Three times a day (TID) | INTRAVENOUS | Status: AC
  Administered 2023-08-08 – 2023-08-12 (×12): 3.375 g via INTRAVENOUS
  Filled 2023-08-08 (×13): qty 50

## 2023-08-08 MED ORDER — CHLORHEXIDINE GLUCONATE CLOTH 2 % EX PADS
6.0000 | MEDICATED_PAD | Freq: Every day | CUTANEOUS | Status: DC
  Administered 2023-08-08 – 2023-08-15 (×8): 6 via TOPICAL

## 2023-08-08 MED ORDER — FENTANYL BOLUS VIA INFUSION: 50.0000 ug | INTRAVENOUS | Status: DC | PRN

## 2023-08-08 MED ORDER — PANTOPRAZOLE SODIUM 40 MG PO TBEC: 40.0000 mg | DELAYED_RELEASE_TABLET | Freq: Every day | ORAL | Status: DC

## 2023-08-08 MED ORDER — ASPIRIN 81 MG PO CHEW
81.0000 mg | CHEWABLE_TABLET | Freq: Every day | ORAL | Status: DC
  Administered 2023-08-08 – 2023-08-18 (×9): 81 mg via ORAL
  Filled 2023-08-08 (×10): qty 1

## 2023-08-08 MED ORDER — DEXMEDETOMIDINE HCL IN NACL 400 MCG/100ML IV SOLN
0.0000 ug/kg/h | INTRAVENOUS | Status: DC
  Administered 2023-08-08: 0.4 ug/kg/h via INTRAVENOUS
  Filled 2023-08-08: qty 100

## 2023-08-08 MED ORDER — LEVETIRACETAM IN NACL 1000 MG/100ML IV SOLN
1000.0000 mg | Freq: Once | INTRAVENOUS | Status: AC
  Administered 2023-08-08: 1000 mg via INTRAVENOUS
  Filled 2023-08-08: qty 100

## 2023-08-08 MED ORDER — LEVETIRACETAM 500 MG PO TABS: 500.0000 mg | ORAL_TABLET | Freq: Two times a day (BID) | ORAL | Status: DC

## 2023-08-08 MED ORDER — PROPOFOL 1000 MG/100ML IV EMUL
INTRAVENOUS | Status: AC
  Filled 2023-08-08: qty 100

## 2023-08-08 MED ORDER — SODIUM CHLORIDE 0.9 % IV SOLN: 250.0000 mL | INTRAVENOUS | Status: DC

## 2023-08-08 MED ORDER — METOPROLOL SUCCINATE ER 50 MG PO TB24
50.0000 mg | ORAL_TABLET | Freq: Every day | ORAL | Status: DC
  Administered 2023-08-08 – 2023-08-18 (×11): 50 mg via ORAL
  Filled 2023-08-08 (×11): qty 1

## 2023-08-08 MED ORDER — DOCUSATE SODIUM 50 MG/5ML PO LIQD: 100.0000 mg | Freq: Two times a day (BID) | ORAL | Status: DC

## 2023-08-08 MED ORDER — PANTOPRAZOLE SODIUM 40 MG IV SOLR: 40.0000 mg | Freq: Every day | INTRAVENOUS | Status: DC

## 2023-08-08 MED ORDER — ROSUVASTATIN CALCIUM 5 MG PO TABS
10.0000 mg | ORAL_TABLET | Freq: Every day | ORAL | Status: DC
  Administered 2023-08-08 – 2023-08-18 (×11): 10 mg via ORAL
  Filled 2023-08-08 (×11): qty 2

## 2023-08-08 MED ORDER — PROPOFOL 10 MG/ML IV BOLUS
0.5000 mg/kg | Freq: Once | INTRAVENOUS | Status: AC
Start: 2023-08-08 — End: 2023-08-08
  Administered 2023-08-08: 20 mg via INTRAVENOUS

## 2023-08-08 MED ORDER — LEVOTHYROXINE SODIUM 88 MCG PO TABS: 88.0000 ug | ORAL_TABLET | Freq: Every day | ORAL | Status: DC

## 2023-08-08 MED ORDER — LACTATED RINGERS IV BOLUS
1000.0000 mL | Freq: Once | INTRAVENOUS | Status: AC
  Administered 2023-08-08: 1000 mL via INTRAVENOUS

## 2023-08-08 MED ORDER — FENTANYL 2500MCG IN NS 250ML (10MCG/ML) PREMIX INFUSION
50.0000 ug/h | INTRAVENOUS | Status: DC
Start: 2023-08-08 — End: 2023-08-08

## 2023-08-08 MED ORDER — PANTOPRAZOLE SODIUM 40 MG PO TBEC
40.0000 mg | DELAYED_RELEASE_TABLET | Freq: Every day | ORAL | Status: DC
  Administered 2023-08-09 – 2023-08-17 (×9): 40 mg via ORAL
  Filled 2023-08-08 (×9): qty 1

## 2023-08-08 MED ORDER — HEPARIN SODIUM (PORCINE) 5000 UNIT/ML IJ SOLN
5000.0000 [IU] | Freq: Two times a day (BID) | INTRAMUSCULAR | Status: DC
  Administered 2023-08-08 – 2023-08-11 (×5): 5000 [IU] via SUBCUTANEOUS
  Filled 2023-08-08 (×7): qty 1

## 2023-08-08 MED ORDER — LEVETIRACETAM 500 MG PO TABS
500.0000 mg | ORAL_TABLET | Freq: Two times a day (BID) | ORAL | Status: DC
  Administered 2023-08-09 – 2023-08-18 (×19): 500 mg via ORAL
  Filled 2023-08-08 (×19): qty 1

## 2023-08-08 MED ORDER — ASPIRIN 81 MG PO CHEW
81.0000 mg | CHEWABLE_TABLET | Freq: Every day | ORAL | Status: DC
  Filled 2023-08-08: qty 1

## 2023-08-08 MED ORDER — NOREPINEPHRINE 4 MG/250ML-% IV SOLN: 2.0000 ug/min | INTRAVENOUS | Status: DC

## 2023-08-08 MED ORDER — ORAL CARE MOUTH RINSE: 15.0000 mL | OROMUCOSAL | Status: DC | PRN

## 2023-08-08 MED ORDER — METOPROLOL TARTRATE 25 MG PO TABS: 25.0000 mg | ORAL_TABLET | Freq: Two times a day (BID) | ORAL | Status: DC

## 2023-08-08 NOTE — Progress Notes (Addendum)
PCCM Interval Note  Patient transferred to floor and after arrival RN notified rapid response when patient became unresponsive and hypoxemic. She spontaneously recovered. O2 had been increased to 15L and weaned to 4L however desatted and became unresponsive again. O2 increased again to 15L. PCCM and Neuro consulted. On my arrival patient responded to sternal rub however multiple intermittent episodes <15 seconds of absence like seizures with no reaction to threat.  Blood pressure (!) 131/52, pulse 81, temperature 97.8 F (36.6 C), temperature source Oral, resp. rate 19, height 5' (1.524 m), weight 40 kg, SpO2 94%.  Physical Exam: General: Elderly-appearing, no acute distress, intermittently able to answer questions between absence like seizure episodes HENT: [place], AT Eyes: EOMI, no scleral icterus Respiratory: Clear to auscultation bilaterally.  No crackles, wheezing or rales Cardiovascular: RRR, -M/R/G, no JVD Extremities:-Edema,-tenderness Neuro: Intermittently alert, CNII-XII grossly intact  Plan to return to ICU for LTM EEG. Will repeat keppra dosing Family (sister) updated on patient returning to ICU. Confirmed code status which is full code Sister lives 3 hours away and requesting update in am Provided nursing station number and new patient room number  Additional critical care time: 45 min

## 2023-08-08 NOTE — Progress Notes (Signed)
Pharmacy Antibiotic Note  Kendra Watts is a 78 y.o. female admitted on 08/08/2023 after found unresponsive.  Pharmacy has been consulted for zosyn dosing.  Plan: Zosyn 3.375G IV q8 hours (4 hour infusion) Follow clinical progression and cultures  Height: 5' (152.4 cm) Weight: 40 kg (88 lb 2.9 oz) IBW/kg (Calculated) : 45.5  Temp (24hrs), Avg:97.5 F (36.4 C), Min:97.5 F (36.4 C), Max:97.5 F (36.4 C)  Recent Labs  Lab 08/08/23 0922  WBC 12.7*  CREATININE 0.84    Estimated Creatinine Clearance: 35.4 mL/min (by C-G formula based on SCr of 0.84 mg/dL).    No Known Allergies    Thank you for allowing pharmacy to be a part of this patient's care.  Kendra Watts 08/08/2023 10:42 AM

## 2023-08-08 NOTE — Progress Notes (Signed)
Per order, RT pulled ETT back 2cm. ETT now secured 20 at the lip.

## 2023-08-08 NOTE — Progress Notes (Signed)
LTM EEG running - no initial skin breakdown - push button tested - neuro notified.  

## 2023-08-08 NOTE — ED Provider Notes (Signed)
[place] [facility] AT [facility] Provider Note   CSN: [id number] Arrival date & time: 08/08/23  6269     History  Chief Complaint  Patient presents with   Respiratory Distress    Kendra Watts is a 78 y.o. female.  HPI 78 yo female ho copd, chf, seizure disorder, ? recurrent lung malignancy, ho breast ca,nstemi in December 2024, from [facility] per report from [facility] patient found unresponsive for 15 minutes.  Reported normotensive, normal rate, poor respiratory effort.  Patient bagged and transported.      Home Medications Prior to Admission medications   Medication Sig Start Date End Date Taking? Authorizing Provider  aspirin 81 MG tablet Take 1 tablet (81 mg total) by mouth daily. 03/21/20  Yes Hongalgi, Maximino Greenland, MD  Budeson-Glycopyrrol-Formoterol (BREZTRI AEROSPHERE) 160-9-4.8 MCG/ACT AERO Inhale 2 puffs into the lungs in the morning and at bedtime. 06/12/21  Yes Leslye Peer, MD  doxazosin (CARDURA) 8 MG tablet Take 8 mg by mouth daily.   Yes [provider]  furosemide (LASIX) 20 MG tablet Take 20 mg by mouth daily.   Yes [provider]  levETIRAcetam (KEPPRA) 500 MG tablet Take 1 tablet (500 mg total) by mouth 2 (two) times daily. 05/20/23  Yes Lomax, Amy, NP  levothyroxine (SYNTHROID) 88 MCG tablet Take 88 mcg by mouth daily before breakfast.   Yes [provider]  metoprolol succinate (TOPROL-XL) 50 MG 24 hr tablet Take 1 tablet (50 mg total) by mouth daily. Take with or immediately following a meal. 07/18/23  Yes Pokhrel, Laxman, MD  pantoprazole (PROTONIX) 40 MG tablet Take 1 tablet (40 mg total) by mouth daily. 07/14/18  Yes Dow Adolph N, DO  rosuvastatin (CRESTOR) 10 MG tablet Take 10 mg by mouth daily.   Yes [provider]  OXYGEN Inhale 2-4 L into the lungs as needed. As needed to maintain SATS > 90%    [provider]      Allergies    Patient has no known allergies.    Review of  Systems   Review of Systems  Physical Exam Updated Vital Signs BP (!) 152/59 (BP Location: Right Arm)   Pulse 81   Temp (!) 97.5 F (36.4 C) (Temporal)   Resp 18 Comment: vent  Ht 1.524 m (5')   Wt 40 kg   SpO2 100%   BMI 17.22 kg/m  Physical Exam Vitals reviewed.  Constitutional:      General: She is in acute distress.     Appearance: She is ill-appearing.  HENT:     Head: Normocephalic and atraumatic.     Right Ear: External ear normal.     Left Ear: External ear normal.     Nose: Nose normal.     Mouth/Throat:     Mouth: Mucous membranes are dry.     Comments: Abrasion to tongue Eyes:     Comments: Roving eye movements pupils equal  Cardiovascular:     Rate and Rhythm: Normal rate.  Musculoskeletal:     Cervical back: Normal range of motion.     ED Results / Procedures / Treatments   Labs (all labs ordered are listed, but only abnormal results are displayed) Labs Reviewed  CBC - Abnormal; Notable for the following components:      Result Value   WBC 12.7 (*)    RBC 2.76 (*)    Hemoglobin 7.5 (*)    HCT 26.1 (*)    MCHC 28.7 (*)  RDW 16.6 (*)    All other components within normal limits  COMPREHENSIVE METABOLIC PANEL - Abnormal; Notable for the following components:   Chloride 90 (*)    CO2 38 (*)    Glucose, Bld 164 (*)    Calcium 8.7 (*)    Total Protein 5.5 (*)    Albumin 2.4 (*)    All other components within normal limits  BRAIN NATRIURETIC PEPTIDE - Abnormal; Notable for the following components:   B Natriuretic Peptide 582.8 (*)    All other components within normal limits  TROPONIN I (HIGH SENSITIVITY) - Abnormal; Notable for the following components:   Troponin I (High Sensitivity) 47 (*)    All other components within normal limits  CULTURE, BLOOD (ROUTINE X 2)  CULTURE, BLOOD (ROUTINE X 2)  RESP PANEL BY RT-PCR (RSV, FLU A&B, COVID)  RVPGX2  PROTIME-INR  LACTIC ACID, PLASMA  LACTIC ACID, PLASMA  URINALYSIS, ROUTINE W REFLEX  MICROSCOPIC  CK  I-STAT ARTERIAL BLOOD GAS, ED    EKG EKG Interpretation Date/Time:  Friday August 08 2023 10:41:15 EST Ventricular Rate:  60 PR Interval:  125 QRS Duration:  84 QT Interval:  466 QTC Calculation: 466 R Axis:   74  Text Interpretation: Sinus rhythm Consider left atrial enlargement Borderline T abnormalities, lateral leads hr has increased since first prior ekg Confirmed by Margarita Grizzle [phone]) on 08/08/2023 10:44:47 AM  Radiology CT Head Wo Contrast Result Date: 08/08/2023 CLINICAL DATA:  Provided history: Mental status change, unknown cause. EXAM: CT HEAD WITHOUT CONTRAST TECHNIQUE: Contiguous axial images were obtained from the base of the skull through the vertex without intravenous contrast. RADIATION DOSE REDUCTION: This exam was performed according to the departmental dose-optimization program which includes automated exposure control, adjustment of the mA and/or kV according to patient size and/or use of iterative reconstruction technique. COMPARISON:  Head CT 07/12/2023.  Brain MRI 11/13/2021. FINDINGS: Brain: Generalized cerebral atrophy. Patchy and ill-defined hypoattenuation within the cerebral white matter, nonspecific but compatible with moderate chronic small vessel ischemic disease. There is no acute intracranial hemorrhage. No demarcated cortical infarct. No extra-axial fluid collection. No evidence of an intracranial mass. No midline shift. Vascular: No hyperdense vessel.  Atherosclerotic calcifications. Skull: No calvarial fracture or aggressive osseous lesion. Sinuses/Orbits: No mass or acute finding within the imaged orbits. Mild mucosal thickening within the bilateral ethmoid, sphenoid and maxillary sinuses. IMPRESSION: 1. No evidence of an acute intracranial abnormality. 2. Parenchymal atrophy and chronic small vessel ischemic disease. 3. Mild paranasal sinus mucosal thickening. Electronically Signed   By: Jackey Loge D.O.   On: 08/08/2023 10:44   DG Chest  Portable 1 View Result Date: 08/08/2023 CLINICAL DATA:  Status post intubation EXAM: PORTABLE CHEST 1 VIEW COMPARISON:  X-Jumana Paccione 07/11/2023 FINDINGS: ET tube in place with tip proximally 5 cm above the carina. Enteric tube with tip extending beneath the diaphragm. Overlapping cardiac leads and defibrillator pads. Stable cardiopericardial silhouette with calcified aorta. Persistent bilateral apical nodular and bandlike opacities with surgical clips. Also clips in the left axillary region. There is however diffuse increasing interstitial changes seen of the lungs. Question tiny right effusion. No pneumothorax. IMPRESSION: Developing interstitial changes seen of the lungs. Acute process is possible. Tiny right effusion. Hyperinflation with stable parenchymal opacity seen along the upper lung zones with a bandlike changes and nodularity and surgical changes. Please correlate with history. ET tube and enteric tube in place Electronically Signed   By: Karen Kays M.D.   On: 08/08/2023 10:30  Procedures Procedure Name: Intubation Date/Time: 08/08/2023 9:32 AM  Performed by: Margarita Grizzle, MDPre-anesthesia Checklist: Patient identified, Patient being monitored, Emergency Drugs available, Timeout performed and Suction available Oxygen Delivery Method: Non-rebreather mask Preoxygenation: Pre-oxygenation with 100% oxygen Induction Type: Rapid sequence Ventilation: Mask ventilation without difficulty Placement Confirmation: ETT inserted through vocal cords under direct vision, CO2 detector and Breath sounds checked- equal and bilateral    .Critical Care  Performed by: Margarita Grizzle, MD Authorized by: Margarita Grizzle, MD   Critical care provider statement:    Critical care time (minutes):  45   Critical care end time:  08/08/2023 10:49 AM   Critical care time was exclusive of:  Separately billable procedures and treating other patients   Critical care was time spent personally by me on the following  activities:  Development of treatment plan with patient or surrogate, discussions with consultants, evaluation of patient's response to treatment, examination of patient, ordering and review of laboratory studies, ordering and review of radiographic studies, ordering and performing treatments and interventions, pulse oximetry, re-evaluation of patient's condition and review of old charts     Medications Ordered in ED Medications  docusate sodium (COLACE) capsule 100 mg (has no administration in time range)  polyethylene glycol (MIRALAX / GLYCOLAX) packet 17 g (has no administration in time range)  ondansetron (ZOFRAN) injection 4 mg (has no administration in time range)  0.9 %  sodium chloride infusion (has no administration in time range)  norepinephrine (LEVOPHED) 4mg  in (0.016 mg/mL) premix infusion (has no administration in time range)  docusate (COLACE) 50 MG/5ML liquid 100 mg (100 mg Per Tube Not Given 08/08/23 1039)  polyethylene glycol (MIRALAX / GLYCOLAX) packet 17 g (has no administration in time range)  fentaNYL (SUBLIMAZE) injection 50 mcg (has no administration in time range)  fentaNYL in NS (36mcg/ml) infusion-PREMIX (has no administration in time range)  fentaNYL (SUBLIMAZE) bolus via infusion 50-100 mcg (has no administration in time range)  propofol (DIPRIVAN) 1000 MG/100ML infusion (has no administration in time range)  piperacillin-tazobactam (ZOSYN) IVPB 3.375 g (has no administration in time range)  piperacillin-tazobactam (ZOSYN) IVPB 3.375 g (has no administration in time range)  etomidate (AMIDATE) injection (15 mg Intravenous Given 08/08/23 0914)  succinylcholine (ANECTINE) injection (150 mg Intravenous Given 08/08/23 0915)  levETIRAcetam (KEPPRA) IVPB 1000 mg/100 mL premix (0 mg Intravenous Stopped 08/08/23 1028)  propofol (DIPRIVAN) 10 mg/mL bolus/IV push 20 mg (20 mg Intravenous Given 08/08/23 0948)  fentaNYL (SUBLIMAZE) injection 50 mcg (50 mcg  Intravenous Given 08/08/23 1003)  lactated ringers bolus 1,000 mL (1,000 mLs Intravenous New Bag/Given 08/08/23 1036)    ED Course/ Medical Decision Making/ A&P Clinical Course as of 08/08/23 1050  Fri Aug 08, 2023  1004 Chest x-Milah Recht reviewed and ET tube appears to be about 1 cm above carina.  Discussed with RT and asked to pull back 1 to 2 cm [DR]  1005 Patient with eye opening and some gagging Propofol increased and fentanyl added [DR]  1005 CBC reviewed interpreted significant for leukocytosis 12,700, anemia with hemoglobin of 7.5 [DR]  1039 Troponin reviewed chart.  Elevated at 47 BN P is elevated at 582 Complete metabolic panel reviewed interpreted significant for hyperglycemia glucose 164 and hypochloremia chloride 90 with CO2 of 38 [DR]  1041 CT head reviewed interpreted no evidence of acute [DR]    Clinical Course User Index [DR] Margarita Grizzle, MD  Medical Decision Making Amount and/or Complexity of Data Reviewed Labs: ordered. Radiology: ordered.   Patient presents with ams, unresponsive, poor respiratory effort DDX ICH-no obvious acute intracranial abnormalities on my interpretation awaiting radiologist interpretation- Respiratory failure-ABG is pending at this time, chest x-Nickalos Petersen shows some developing interstitial changes with stable parenchymal opacity COVID, flu, ABG pending Seizure-Patient with history of known seizure disorder and abrasion to tongue here.  Patient had Keppra loaded here. Infection-possible lung infection, awaiting viral screen Consider cardiac etiology-patient with elevated BNP and troponin EKG appears stable with no acute changes Metabolic abnormality - hypo/hyperlycemia, other electrolyte abnormalities Toxin induced Anemia patient with hemoglobin of 7.5 which is decreased from first prior of 9.  No obvious bleeding noted  Records reviewed. Patient more alert Awaiting remainder of labs   Patient here post  intubation in the ED.  She is sedated with propofol and fentanyl. She has been hemodynamically stable with some lower blood pressures around 100. Care discussed with Dr. Katrinka Blazing on for critical care and he is seeing and evaluating patient         Final Clinical Impression(s) / ED Diagnoses Final diagnoses:  Acute respiratory failure, unspecified whether with hypoxia or hypercapnia (HCC)  Altered mental status, unspecified altered mental status type    Rx / DC Orders ED Discharge Orders     None         Margarita Grizzle, MD 08/08/23 1050

## 2023-08-08 NOTE — Consult Note (Signed)
NEUROLOGY CONSULT NOTE   Date of service: August 08, 2023 Patient Name: Kendra Watts MRN:  [id number] DOB:  1946/06/05 Chief Complaint: "concern for absence seizures" Requesting Provider: Lorin Glass, MD  History of Present Illness  Kendra Watts is a 78 y.o. 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 She was found unresponsive for 15 mins. She was bagged enroute and intubated in the ED and admitted to the ICU. She was extubated and was transferred to the floor. On the floor, she was noted to have episodes of starring off into space and would be mute during these events. She would withdraw to pain. She had multiple episodes and inbetween would be communicative.  Neurology was consulted for further evaluation. She had rEEG earlier today was negative for any acute abnormalities.  CT Head earlier in the ED with no acute abnormalities.  She is on Keppra 500mg  BID at home and seems like she was continued on it during this hospitalization.  She follows with [facility] neurology. She was admitted back in April of 2023 with a left occipital lobe ICH. She had presented with episode of starring and fall and left sided shaking. She had LTM EEG which did not capture events and no noted epileptiform discharges. She was discharged on Keppra 500mg  BID.   ROS  Unable to ascertain due to encephalopathy, confusion and paranoia.  Past History   Past Medical History:  Diagnosis Date   Anemia    Aortic arch anomaly    arteria lusoria    Breast cancer (HCC) 09/22/2015   Malignant   Breast cancer of upper-outer quadrant of left female breast (HCC) 09/08/2015   Cataract, immature    CHF (congestive heart failure) (HCC)    Acute CHF-06/2018   COPD (chronic obstructive  pulmonary disease) (HCC)    Encephalopathy acute 11/12/2021   Femur fracture, left (HCC) 04/11/2020   left femur fracture( greater trochanter   Heart murmur    states no known problems   History of cervical spine x-ray 11/13/2021   History of hyperthyroidism    History of radiation therapy    bilateral lungs - 10/27/18-11/02/18, Dr. Antony Blackbird   History of radiation therapy 11/28/2015   left breast 10/30/2015-11/28/2015   Dr Antony Blackbird   Hyperlipidemia    Hypertension    states under control with meds., has been on med. x "long time"   Hypokalemia    from last physical.    Hypothyroidism    Nonfunctioning kidney    left   Personal history of radiation therapy    2017   Pulmonary nodules    Bilateral   Radiation 10/30/15-11/28/15   left breast 42.72 Gy, boosted to 10 Gy   Renal artery stenosis (HCC)    Tobacco abuse    Wears partial dentures    upper and lower    Past Surgical History:  Procedure Laterality Date   ABDOMINAL ANGIOGRAM  02/18/2012   Procedure: ABDOMINAL ANGIOGRAM;  Surgeon: Runell Gess, MD;  Location: [facility] CATH LAB;  Service: Cardiovascular;;   ABDOMINAL AORTAGRAM  07/04/2014   ABDOMINAL HYSTERECTOMY  ~ 1977   partial   APPENDECTOMY     ARCH AORTOGRAM     BREAST LUMPECTOMY Left 09/22/2015   Malignant   CAROTID ANGIOGRAM  N/A 02/18/2012   Procedure: CAROTID ANGIOGRAM;  Surgeon: Runell Gess, MD;  Location: [facility] CATH LAB;  Service: Cardiovascular;  Laterality: N/A;   ENDARTERECTOMY  04/02/2012   Procedure: ENDARTERECTOMY CAROTID;  Surgeon: Nada Libman, MD;  Location: [facility] OR;  Service: Vascular;  Laterality: Right;   FUDUCIAL PLACEMENT Bilateral 09/02/2018   Procedure: Placement Of Fiducial to right upper lobe & left upper lobe lung;  Surgeon: Leslye Peer, MD;  Location: [facility] OR;  Service: Thoracic;  Laterality: Bilateral;   IR THORACENTESIS ASP PLEURAL SPACE W/IMG GUIDE  07/08/2018   RADIOACTIVE SEED GUIDED PARTIAL MASTECTOMY WITH AXILLARY SENTINEL LYMPH  NODE BIOPSY Left 09/22/2015   Procedure: INJECT BLUE DYE LEFT BREAST,RADIOACTIVE SEED GUIDED PARTIAL MASTECTOMY WITH AXILLARY SENTINEL LYMPH NODE BIOPSY;  Surgeon: Claud Kelp, MD;  Location: [facility];  Service: General;  Laterality: Left;   RENAL ANGIOGRAM Left 06/08/2010   renal artery stent -  5x12 Genesis on Aviator balloon stent (Dr. Erlene Quan)   RENAL ANGIOGRAM Right 07/04/2014   Procedure: RENAL ANGIOGRAM;  Surgeon: Runell Gess, MD;  Location: [facility] CATH LAB;  Service: Cardiovascular;  Laterality: Right;   RENAL ANGIOGRAM Right 08/22/2014   Procedure: RENAL ANGIOGRAM;  Surgeon: Runell Gess, MD;  Location: [facility] CATH LAB;  Service: Cardiovascular;  Laterality: Right;   TONSILLECTOMY     as a child   VIDEO BRONCHOSCOPY WITH ENDOBRONCHIAL NAVIGATION N/A 09/02/2018   Procedure: VIDEO BRONCHOSCOPY WITH ENDOBRONCHIAL NAVIGATION;  Surgeon: Leslye Peer, MD;  Location: [facility] OR;  Service: Thoracic;  Laterality: N/A;    Family History: Family History  Problem Relation Age of Onset   Heart disease Mother        MI @ 58, died at 50   Cancer Father    Lung cancer Father    Lung cancer Sister    Breast cancer Paternal Aunt    Heart disease Maternal Grandmother    Stroke Neg Hx     Social History  reports that she has been smoking cigarettes. She has a 20 pack-year smoking history. She has never used smokeless tobacco. She reports current alcohol use of about 4.0 standard drinks of alcohol per week. She reports that she does not use drugs.  No Known Allergies  Medications   Current Facility-Administered Medications:    arformoterol (BROVANA) nebulizer solution 15 mcg, 15 mcg, Nebulization, BID, Lorin Glass, MD, 15 mcg at 08/08/23 1426   aspirin chewable tablet 81 mg, 81 mg, Oral, Daily, Lorin Glass, MD, 81 mg at 08/08/23 1526   budesonide (PULMICORT) nebulizer solution 0.5 mg, 0.5 mg, Nebulization, BID, Lorin Glass, MD, 0.5 mg at 08/08/23 1426    Chlorhexidine Gluconate Cloth 2 % PADS 6 each, 6 each, Topical, Daily, Lorin Glass, MD, 6 each at 08/08/23 2038   docusate sodium (COLACE) capsule 100 mg, 100 mg, Oral, BID PRN, Lorin Glass, MD   docusate sodium (COLACE) capsule 100 mg, 100 mg, Oral, BID, Lorin Glass, MD, 100 mg at 08/08/23 1527   heparin injection 5,000 Units, 5,000 Units, Subcutaneous, Q12H, Lorin Glass, MD, 5,000 Units at 08/08/23 1527   [START ON 08/09/2023] levETIRAcetam (KEPPRA) tablet 500 mg, 500 mg, Oral, BID, Luciano Cutter, MD   Melene Muller ON 08/09/2023] levothyroxine (SYNTHROID) tablet 88 mcg, 88 mcg, Oral, QAC breakfast, Lorin Glass, MD   metoprolol succinate (TOPROL-XL) 24 hr tablet 50 mg, 50 mg, Oral, Daily, Lorin Glass, MD, 50 mg at 08/08/23  1527   norepinephrine (LEVOPHED) 4mg  in (0.016 mg/mL) premix infusion, 2-10 mcg/min, Intravenous, Titrated, Lorin Glass, MD   ondansetron [facility]) injection 4 mg, 4 mg, Intravenous, Q6H PRN, Lorin Glass, MD   Oral care mouth rinse, 15 mL, Mouth Rinse, PRN, Lorin Glass, MD   pantoprazole (PROTONIX) EC tablet 40 mg, 40 mg, Oral, QHS, Lorin Glass, MD   piperacillin-tazobactam (ZOSYN) IVPB 3.375 g, 3.375 g, Intravenous, Q8H, Lorin Glass, MD, Last Rate: 12.5 mL/hr at 2023/09/04 1840, 3.375 g at 09-04-23 1840   polyethylene glycol (MIRALAX / GLYCOLAX) packet 17 g, 17 g, Oral, Daily PRN, Lorin Glass, MD   revefenacin (YUPELRI) nebulizer solution 175 mcg, 175 mcg, Nebulization, Daily, Lorin Glass, MD, 175 mcg at 2023-09-04 1426   rosuvastatin (CRESTOR) tablet 10 mg, 10 mg, Oral, Daily, Lorin Glass, MD, 10 mg at 09-04-2023 1527   valproate (DEPACON) 800 mg in dextrose 5 % 50 mL IVPB, 800 mg, Intravenous, Once, Erick Blinks, MD, Last Rate: 58 mL/hr at 2023/09/04 2050, 800 mg at 09-04-23 2050  Vitals   Vitals:   Sep 04, 2023 1600 09/04/2023 1855 04-Sep-2023 1911 September 04, 2023 2026  BP: (!) 152/70 136/70 (!) 131/52 138/67  Pulse: 76 78 81 78   Resp: 20 19  (!) 22  Temp:  97.8 F (36.6 C)  98.5 F (36.9 C)  TempSrc:  Oral  Oral  SpO2: 100% 96% 94% 100%  Weight:      Height:        Body mass index is 17.22 kg/m.  Physical Exam  General: Laying in bed; slightly confused and agitated. in no acute distress.  HENT: Normal oropharynx and mucosa. Normal external appearance of ears and nose.  Neck: Supple, no pain or tenderness  CV: No JVD. No peripheral edema.  Pulmonary: Symmetric Chest rise. Normal respiratory effort.  Abdomen: Soft to touch, non-tender.  Ext: No cyanosis, edema, or deformity  Skin: No rash. Normal palpation of skin.   Musculoskeletal: Normal digits and nails by inspection. No clubbing.   Neurologic Examination  Mental status/Cognition: Alert, oriented to self, place, but not to month and year, good attention.  Speech/language: Fluent, comprehension intact, object naming intact, repetition intact. Cranial nerves:   CN II Pupils equal and reactive to light, no VF deficits    CN III,IV,VI EOM intact, no gaze preference or deviation, no nystagmus    CN V normal sensation in V1, V2, and V3 segments bilaterally    CN VII no asymmetry, no nasolabial fold flattening    CN VIII normal hearing to speech    CN IX & X normal palatal elevation, no uvular deviation    CN XI 5/5 head turn and 5/5 shoulder shrug bilaterally    CN XII midline tongue protrusion    Motor:  Muscle bulk: poor, tone normal Moves all extremities spontanoeusly and antigravity. Follows commands in all extremities.  Sensation:  Light touch Intact throughout   Pin prick    Temperature    Vibration   Proprioception    Coordination/Complex Motor:  - Finger to Nose intact BL - Heel to shin patient declined to let me evaluate - Rapid alternating movement are intact BL - Gait: deferred.   Labs/Imaging/Neurodiagnostic studies   CBC:  Recent Labs  Lab 04-Sep-2023 0922 September 04, 2023 1117  WBC 12.7*  --   HGB 7.5* 8.8*  HCT 26.1* 26.0*   MCV 94.6  --   PLT 284  --    Basic Metabolic  Panel:  Lab Results  Component Value Date   NA 140 08/08/2023   K 3.9 08/08/2023   CO2 38 (H) 08/08/2023   GLUCOSE 164 (H) 08/08/2023   BUN 13 08/08/2023   CREATININE 0.84 08/08/2023   CALCIUM 8.7 (L) 08/08/2023   GFRNONAA >60 08/08/2023   GFRAA >60 04/12/2020   Lipid Panel:  Lab Results  Component Value Date   LDLCALC 45 07/12/2023   HgbA1c:  Lab Results  Component Value Date   HGBA1C 5.6 11/12/2021   Urine Drug Screen:     Component Value Date/Time   LABOPIA NONE DETECTED 11/12/2021 1807   COCAINSCRNUR NONE DETECTED 11/12/2021 1807   LABBENZ POSITIVE (A) 11/12/2021 1807   AMPHETMU NONE DETECTED 11/12/2021 1807   THCU NONE DETECTED 11/12/2021 1807   LABBARB NONE DETECTED 11/12/2021 1807    Alcohol Level No results found for: "ETH" INR  Lab Results  Component Value Date   INR 1.1 08/08/2023   APTT  Lab Results  Component Value Date   APTT 28 07/11/2023   AED levels: No results found for: "PHENYTOIN", "ZONISAMIDE", "LAMOTRIGINE", "LEVETIRACETA"  CT Head without contrast(Personally reviewed): CTH was negative for a large hypodensity concerning for a large territory infarct or hyperdensity concerning for an ICH  Neurodiagnostics cEEG:  pending  ASSESSMENT   Kendra Watts is a 78 y.o. 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 She was found unresponsive for 15 mins. She was bagged enroute and intubated in the ED and admitted to the ICU. She was extubated and was transferred to the floor. On the floor, she was noted to have episodes of starring off into space and would be mute during these events. She would withdraw to pain. She had multiple episodes and inbetween would be  communicative.  I witnessed 1 of these events while I was in the room with her.  It was very hard to clinically tell if she truly was having a staring off spell versus this being episode of behavioral arrest.  She wanted me to leave her alone.  The best course of action I think is to put her up on LTM EEG to see if we can characterize her spells.  Given that she is having them so frequently, there is a good chance we should be able to capture 1 in the next 24 hours. Her prior L occipital lobe hemorrhage, does    RECOMMENDATIONS  - Valproic acid load x 1, continue home Keppra. - cEEG overnight for spell capture. - MRI Brain w/o contrast after cEEG. ______________________________________________________________________    Welton Flakes, MD [facility]

## 2023-08-08 NOTE — Progress Notes (Signed)
RT assisted with transport of this pt from ED to 69M while on full ventilatory support. Pt tolerated well with SVS no complications.

## 2023-08-08 NOTE — Progress Notes (Signed)
Transported patient to ICU room 9. Gave report to ICU  primary care nurse for this patient.

## 2023-08-08 NOTE — Procedures (Signed)
Patient Name: Kendra Watts  MRN: [id number]  Epilepsy Attending: Charlsie Quest  Referring Physician/Provider: Lorin Glass, MD  Date: 08/07/2022 Duration: 26.10 mins  Patient history: 78yo F with ams getting eeg to evaluate for seizure  Level of alertness: Awake  AEDs during EEG study: LEV  Technical aspects: This EEG study was done with scalp electrodes positioned according to the 10-20 International system of electrode placement. Electrical activity was reviewed with band pass filter of 1-70Hz , sensitivity of 7 uV/mm, display speed of 26mm/sec with a 60Hz  notched filter applied as appropriate. EEG data were recorded continuously and digitally stored.  Video monitoring was available and reviewed as appropriate.  Description: EEG showed continuous generalized predominantly 5 to 7 Hz theta slowing admixed with intermittent generalized 2-3Hz  delta slowing. Hyperventilation and photic stimulation were not performed.     ABNORMALITY - Continuous slow, generalized  IMPRESSION: This study is suggestive of moderate diffuse encephalopathy. No seizures or epileptiform discharges were seen throughout the recording.  Diann Bangerter Annabelle Harman

## 2023-08-08 NOTE — H&P (Signed)
NAME:  Kendra Watts, MRN:  [id number], DOB:  [birth year], LOS: 0 ADMISSION DATE:  08/08/2023, CONSULTATION DATE:  08/08/23 REFERRING MD:  EDP, CHIEF COMPLAINT:  AMS   History of Present Illness:  78 year old woman with hx of O2-dependent COPD, bilateral lung cancer with prior SBRT, FTT, severe protein calorie malnutrition, prior seizures presenting after being found obtunded at [facility].  Intubated in ER and PCCM consulted.  Pertinent  Medical History   Past Medical History:  Diagnosis Date   Anemia    Aortic arch anomaly    arteria lusoria    Breast cancer (HCC) 09/22/2015   Malignant   Breast cancer of upper-outer quadrant of left female breast (HCC) 09/08/2015   Cataract, immature    CHF (congestive heart failure) (HCC)    Acute CHF-06/2018   COPD (chronic obstructive pulmonary disease) (HCC)    Encephalopathy acute 11/12/2021   Femur fracture, left (HCC) 04/11/2020   left femur fracture( greater trochanter   Heart murmur    states no known problems   History of cervical spine x-ray 11/13/2021   History of hyperthyroidism    History of radiation therapy    bilateral lungs - 10/27/18-11/02/18, Dr. Antony Blackbird   History of radiation therapy 11/28/2015   left breast 10/30/2015-11/28/2015   Dr Antony Blackbird   Hyperlipidemia    Hypertension    states under control with meds., has been on med. x "long time"   Hypokalemia    from last physical.    Hypothyroidism    Nonfunctioning kidney    left   Personal history of radiation therapy    2017   Pulmonary nodules    Bilateral   Radiation 10/30/15-11/28/15   left breast 42.72 Gy, boosted to 10 Gy   Renal artery stenosis (HCC)    Tobacco abuse    Wears partial dentures    upper and lower     Significant Hospital Events: Including procedures, antibiotic start and stop dates in addition to other pertinent events   1/17 admit  Interim History / Subjective:  Arrives with prop going, following commands.  Objective   Blood  pressure (!) 152/59, pulse 81, temperature (!) 97.5 F (36.4 C), temperature source Oral, resp. rate (!) 26, height 5' (1.524 m), weight 40 kg, SpO2 92%.    Vent Mode: PRVC FiO2 (%):  [100 %] 100 % Set Rate:  [18 bmp] 18 bmp Vt Set:  [400 mL] 400 mL PEEP:  [5 cmH20] 5 cmH20 Plateau Pressure:  [16 cmH20-26 cmH20] 26 cmH20  No intake or output data in the 24 hours ending 08/08/23 1219 Filed Weights   08/08/23 0938  Weight: 40 kg    Examination: General: chronically ill on vent HENT: Ett in place minimal secretions Lungs: diminished, occasional rhonci, no wheezing Cardiovascular: RRR, ext warm Abdomen: soft, +BS Extremities: +muscle wasting Neuro: moves to command RASS 0  CXR with RUL infiltrate acute on chronic and some interstitial changes  Resolved Hospital Problem list   N/A  Assessment & Plan:  AMS- clinical hx most c/w seizure; lactate was up but ck relatively preserved; also suppose it could have been hypercarbia Acute on chronic hypoxemic and hypercarbic resp failure- would tx as aspiration Hx pulmonary cachexia, FTT, possible recurrent lung cancer Severe COPD Anemia, acute on chronic- no s/s of bleeding  - Wean to extubate - Zosyn - Triple neb therapy - Spot EEG, continue PTA keppra - BIPAP at bedtime and PRN - RD consult - Palliative consult,  she may be best served in hospice given progressive FTT and near end stage lung disease, possible recurrent cancer  Addendum: I circled back later in afternoon, extubated and back to baseline O2 need, remains confused query postictal state;  HD stable.  Spot EEG neg.  Passed swallow and start mechanical soft diet.  Should be okay for med tele 1/18, remaining issues: RD, PT, OT, PMT consults Mental status improvement BIPAP at SNF if she is willing  Best Practice (right click and "Reselect all SmartList Selections" daily)   Diet/type: NPO DVT prophylaxis prophylactic heparin  Pressure ulcer(s): N/A GI prophylaxis:  PPI Lines: N/A Foley:  N/A Code Status:  full code Last date of multidisciplinary goals of care discussion [pending]  Labs   CBC: Recent Labs  Lab 08/08/23 0922 08/08/23 1117  WBC 12.7*  --   HGB 7.5* 8.8*  HCT 26.1* 26.0*  MCV 94.6  --   PLT 284  --     Basic Metabolic Panel: Recent Labs  Lab 08/08/23 0922 08/08/23 1117  NA 142 140  K 3.9 3.9  CL 90*  --   CO2 38*  --   GLUCOSE 164*  --   BUN 13  --   CREATININE 0.84  --   CALCIUM 8.7*  --    GFR: Estimated Creatinine Clearance: 35.4 mL/min (by C-G formula based on SCr of 0.84 mg/dL). Recent Labs  Lab 08/08/23 0922 08/08/23 1020  WBC 12.7*  --   LATICACIDVEN  --  3.8*    Liver Function Tests: Recent Labs  Lab 08/08/23 0922  AST 36  ALT 39  ALKPHOS 69  BILITOT 0.3  PROT 5.5*  ALBUMIN 2.4*   No results for input(s): "LIPASE", "AMYLASE" in the last 168 hours. No results for input(s): "AMMONIA" in the last 168 hours.  ABG    Component Value Date/Time   PHART 7.335 (L) 08/08/2023 1117   PCO2ART 88.5 (HH) 08/08/2023 1117   PO2ART 489 (H) 08/08/2023 1117   HCO3 47.5 (H) 08/08/2023 1117   TCO2 >50 (H) 08/08/2023 1117   O2SAT 100 08/08/2023 1117     Coagulation Profile: Recent Labs  Lab 08/08/23 0922  INR 1.1    Cardiac Enzymes: Recent Labs  Lab 08/08/23 1022  CKTOTAL 30*    HbA1C: Hgb A1c MFr Bld  Date/Time Value Ref Range Status  11/12/2021 04:10 PM 5.6 4.8 - 5.6 % Final    Comment:    (NOTE) Pre diabetes:          5.7%-6.4%  Diabetes:              >6.4%  Glycemic control for   <7.0% adults with diabetes     CBG: Recent Labs  Lab 08/08/23 1102  GLUCAP 92    Review of Systems:   Intubated  Past Medical History:  She,  has a past medical history of Anemia, Aortic arch anomaly, Breast cancer (HCC) (09/22/2015), Breast cancer of upper-outer quadrant of left female breast (HCC) (09/08/2015), Cataract, immature, CHF (congestive heart failure) (HCC), COPD (chronic  obstructive pulmonary disease) (HCC), Encephalopathy acute (11/12/2021), Femur fracture, left (HCC) (04/11/2020), Heart murmur, History of cervical spine x-ray (11/13/2021), History of hyperthyroidism, History of radiation therapy, History of radiation therapy (11/28/2015), Hyperlipidemia, Hypertension, Hypokalemia, Hypothyroidism, Nonfunctioning kidney, Personal history of radiation therapy, Pulmonary nodules, Radiation (10/30/15-11/28/15), Renal artery stenosis (HCC), Tobacco abuse, and Wears partial dentures.   Surgical History:   Past Surgical History:  Procedure Laterality Date   ABDOMINAL ANGIOGRAM  02/18/2012   Procedure: ABDOMINAL ANGIOGRAM;  Surgeon: Runell Gess, MD;  Location: [facility] CATH LAB;  Service: Cardiovascular;;   ABDOMINAL AORTAGRAM  07/04/2014   ABDOMINAL HYSTERECTOMY  ~ 1977   partial   APPENDECTOMY     ARCH AORTOGRAM     BREAST LUMPECTOMY Left 09/22/2015   Malignant   CAROTID ANGIOGRAM N/A 02/18/2012   Procedure: CAROTID ANGIOGRAM;  Surgeon: Runell Gess, MD;  Location: [facility] CATH LAB;  Service: Cardiovascular;  Laterality: N/A;   ENDARTERECTOMY  04/02/2012   Procedure: ENDARTERECTOMY CAROTID;  Surgeon: Nada Libman, MD;  Location: [facility] OR;  Service: Vascular;  Laterality: Right;   FUDUCIAL PLACEMENT Bilateral 09/02/2018   Procedure: Placement Of Fiducial to right upper lobe & left upper lobe lung;  Surgeon: Leslye Peer, MD;  Location: [facility] OR;  Service: Thoracic;  Laterality: Bilateral;   IR THORACENTESIS ASP PLEURAL SPACE W/IMG GUIDE  07/08/2018   RADIOACTIVE SEED GUIDED PARTIAL MASTECTOMY WITH AXILLARY SENTINEL LYMPH NODE BIOPSY Left 09/22/2015   Procedure: INJECT BLUE DYE LEFT BREAST,RADIOACTIVE SEED GUIDED PARTIAL MASTECTOMY WITH AXILLARY SENTINEL LYMPH NODE BIOPSY;  Surgeon: Claud Kelp, MD;  Location: [facility];  Service: General;  Laterality: Left;   RENAL ANGIOGRAM Left 06/08/2010   renal artery stent -  5x12 Genesis on Aviator balloon stent (Dr.  Erlene Quan)   RENAL ANGIOGRAM Right 07/04/2014   Procedure: RENAL ANGIOGRAM;  Surgeon: Runell Gess, MD;  Location: [facility] CATH LAB;  Service: Cardiovascular;  Laterality: Right;   RENAL ANGIOGRAM Right 08/22/2014   Procedure: RENAL ANGIOGRAM;  Surgeon: Runell Gess, MD;  Location: [facility] CATH LAB;  Service: Cardiovascular;  Laterality: Right;   TONSILLECTOMY     as a child   VIDEO BRONCHOSCOPY WITH ENDOBRONCHIAL NAVIGATION N/A 09/02/2018   Procedure: VIDEO BRONCHOSCOPY WITH ENDOBRONCHIAL NAVIGATION;  Surgeon: Leslye Peer, MD;  Location: [facility] OR;  Service: Thoracic;  Laterality: N/A;     Social History:   reports that she has been smoking cigarettes. She has a 20 pack-year smoking history. She has never used smokeless tobacco. She reports current alcohol use of about 4.0 standard drinks of alcohol per week. She reports that she does not use drugs.   Family History:  Her family history includes Breast cancer in her paternal aunt; Cancer in her father; Heart disease in her maternal grandmother and mother; Lung cancer in her father and sister. There is no history of Stroke.   Allergies No Known Allergies   Home Medications  Prior to Admission medications   Medication Sig Start Date End Date Taking? Authorizing Provider  aspirin 81 MG tablet Take 1 tablet (81 mg total) by mouth daily. 03/21/20  Yes Hongalgi, Maximino Greenland, MD  Budeson-Glycopyrrol-Formoterol (BREZTRI AEROSPHERE) 160-9-4.8 MCG/ACT AERO Inhale 2 puffs into the lungs in the morning and at bedtime. 06/12/21  Yes Leslye Peer, MD  doxazosin (CARDURA) 8 MG tablet Take 8 mg by mouth daily.   Yes [provider]  furosemide (LASIX) 20 MG tablet Take 20 mg by mouth daily.   Yes [provider]  levETIRAcetam (KEPPRA) 500 MG tablet Take 1 tablet (500 mg total) by mouth 2 (two) times daily. 05/20/23  Yes Lomax, Amy, NP  levothyroxine (SYNTHROID) 88 MCG tablet Take 88 mcg by mouth daily before breakfast.   Yes [provider]  metoprolol succinate (TOPROL-XL) 50 MG 24 hr tablet Take 1 tablet (50 mg total) by mouth daily. Take with or immediately following a meal. 07/18/23  Yes Pokhrel, Laxman, MD  pantoprazole (PROTONIX) 40 MG tablet Take 1 tablet (40 mg total) by mouth daily. 07/14/18  Yes Dow Adolph N, DO  rosuvastatin (CRESTOR) 10 MG tablet Take 10 mg by mouth daily.   Yes [provider]  OXYGEN Inhale 2-4 L into the lungs as needed. As needed to maintain SATS > 90%    [provider]     Critical care time: 31 mins

## 2023-08-08 NOTE — Significant Event (Addendum)
Rapid Response Event Note   Reason for Call :  Unresponsiveness, hypoxia  Per RN, pt just transferred to floor from ICU. RN was assessing pt when she developed a blank stare, dropped her SpO2 to the 70s on 4L HFNC, became dusky, and stopped responding. Per RN, pulse was present t/o episode. FiO2 was increased to 15L  HFNC and RRT was called.  When RRT responded pt appeared to be back to baseline.  Initial Focused Assessment:  Pt alert and able to answer questions and follow commands, denied dizziness/chest pain/SOB. Skin was warm and dry. Pupils equal and reactive.   T-97.8, HR-81, BP-131/52, RR-22, SpO2-98% on 15L HFNC. Attempted to titrate oxygen back down to 4L HFNC and SpO2 dropped to 70s again.   Pt reassessed and was starting off into space again.  Pt would move all extremities to pain and blink to threat but would not track RN or speak. After a few minutes, pt began to speak and follow commands again. No twitching/shaking noted during this episode.   HR-75, SBP-120s, RR-20, SpO2-97% on 14L HFNC  Interventions:  HFNC increased to 14L TX to 2M09 Plan of Care:  Tx to 2M09.  Event Summary:   MD Notified: Dr. Katrinka Blazing notified by bedside RN, Dr Everardo All to bedside to assess pt Call Time:1906 Arrival Time:1908 End Time:2008  Terrilyn Saver, RN

## 2023-08-08 NOTE — Progress Notes (Signed)
Transported pt from TRA C to CT2 and back with bedside RN. Vitals are stable.
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
## 2023-08-08 NOTE — Progress Notes (Signed)
Pt arrived to unit, bedside report done with Jesusita Oka RN, bp stable and pt was brought down on 4lNC. Upon putting pt on telemetry, noticed pt to become pale in color, and became unresponsive with eyes rolling to the back of her head, oxygen with good pleth showed 76%, o2 bumped up to 15L [place],  96%  bp 136/60 HR 70 BS 189.Rapid response called. Pt at the time was not responding to verbal stimuli  or sternal rub. Pt then sponteously  started responding attempted, take her back down to 4L [place] and pt immediatly desat. MD at bedside currently on 15 L [place]. Pt to transfer out of unit

## 2023-08-08 NOTE — Progress Notes (Signed)
RT attempted x2 to obtain ABG. RT was unsuccessful.

## 2023-08-08 NOTE — Progress Notes (Signed)
eLink Physician-Brief Progress Note Patient Name: Kendra Watts DOB: 1946/03/03 MRN: [id number]   Date of Service  08/08/2023  HPI/Events of Note  Resp failure  eICU Interventions  Bipap/precedex   CPOID Agitation Hypoxemia PCO2 88   Bipap Precedex     Tallen Schnorr 08/08/2023, 9:25 PM

## 2023-08-08 NOTE — Plan of Care (Signed)

## 2023-08-08 NOTE — Progress Notes (Signed)
Pt refused eeg. Benefits discussed, pt continued to adamantly refuse EEG. Neuro consulted, will attempt after versed as schedule allows.

## 2023-08-08 NOTE — Procedures (Signed)
Extubation Procedure Note  Patient Details:   Name: Kendra Watts DOB: Sep 21, 1945 MRN: [id number]   Airway Documentation:    Vent end date: 08/08/23 Vent end time: 1130   Evaluation  O2 sats: stable throughout Complications: No apparent complications Patient did tolerate procedure well.     Yes   Pt extubated per MD order. Placed on 3L [place]. Positive cuff leak noted, no stridor heard. RT will continue to monitor.   Vicente Masson 08/08/2023, 11:32 AM

## 2023-08-08 NOTE — Progress Notes (Signed)
EEG complete - results pending 

## 2023-08-08 NOTE — ED Triage Notes (Signed)
Patient presents to ed via [facility] from [facility] per ems staff state patient was ok and approx. 30 mins  later was found on the floor unresp. Upon ems arrival states patient was unresp. Cyanotic with sats in the 40's. Patient was being bagged upon arrival ems states they bagged in breathing treatment albuterol 5 mg and  Atrovent. Upon arrival to ed unresp pupils 3 bilaterally and sluggish.

## 2023-08-09 ENCOUNTER — Inpatient Hospital Stay (HOSPITAL_COMMUNITY): Payer: Medicare Other

## 2023-08-09 DIAGNOSIS — J9601 Acute respiratory failure with hypoxia: Secondary | ICD-10-CM | POA: Diagnosis not present

## 2023-08-09 DIAGNOSIS — R569 Unspecified convulsions: Secondary | ICD-10-CM | POA: Diagnosis not present

## 2023-08-09 DIAGNOSIS — R4182 Altered mental status, unspecified: Secondary | ICD-10-CM | POA: Diagnosis not present

## 2023-08-09 LAB — BASIC METABOLIC PANEL
Anion gap: 12 (ref 5–15)
BUN: 16 mg/dL (ref 8–23)
CO2: 39 mmol/L — ABNORMAL HIGH (ref 22–32)
Calcium: 8.6 mg/dL — ABNORMAL LOW (ref 8.9–10.3)
Chloride: 91 mmol/L — ABNORMAL LOW (ref 98–111)
Creatinine, Ser: 1.14 mg/dL — ABNORMAL HIGH (ref 0.44–1.00)
GFR, Estimated: 50 mL/min — ABNORMAL LOW (ref >60)
Glucose, Bld: 73 mg/dL (ref 70–99)
Potassium: 4.2 mmol/L (ref 3.5–5.1)
Sodium: 142 mmol/L (ref 135–145)

## 2023-08-09 LAB — GLUCOSE, CAPILLARY: Glucose-Capillary: 126 mg/dL — ABNORMAL HIGH (ref 70–99)

## 2023-08-09 LAB — CBC
HCT: 24 % — ABNORMAL LOW (ref 36.0–46.0)
Hemoglobin: 7.2 g/dL — ABNORMAL LOW (ref 12.0–15.0)
MCH: 27.1 pg (ref 26.0–34.0)
MCHC: 30 g/dL (ref 30.0–36.0)
MCV: 90.2 fL (ref 80.0–100.0)
Platelets: 274 10*3/uL (ref 150–400)
RBC: 2.66 MIL/uL — ABNORMAL LOW (ref 3.87–5.11)
RDW: 16.7 % — ABNORMAL HIGH (ref 11.5–15.5)
WBC: 16.7 10*3/uL — ABNORMAL HIGH (ref 4.0–10.5)
nRBC: 0 % (ref 0.0–0.2)

## 2023-08-09 LAB — MAGNESIUM: Magnesium: 1.9 mg/dL (ref 1.7–2.4)

## 2023-08-09 LAB — IRON AND TIBC
Iron: 14 ug/dL — ABNORMAL LOW (ref 28–170)
Saturation Ratios: 7 % — ABNORMAL LOW (ref 10.4–31.8)
TIBC: 214 ug/dL — ABNORMAL LOW (ref 250–450)
UIBC: 200 ug/dL

## 2023-08-09 LAB — FERRITIN: Ferritin: 122 ng/mL (ref 11–307)

## 2023-08-09 LAB — PHOSPHORUS: Phosphorus: 3.5 mg/dL (ref 2.5–4.6)

## 2023-08-09 MED ORDER — MAGNESIUM SULFATE 2 GM/50ML IV SOLN
2.0000 g | Freq: Once | INTRAVENOUS | Status: AC
Start: 2023-08-09 — End: 2023-08-09
  Administered 2023-08-09: 2 g via INTRAVENOUS
  Filled 2023-08-09: qty 50

## 2023-08-09 MED ORDER — HYDRALAZINE HCL 20 MG/ML IJ SOLN
20.0000 mg | Freq: Four times a day (QID) | INTRAMUSCULAR | Status: DC | PRN
  Administered 2023-08-09: 20 mg via INTRAVENOUS
  Filled 2023-08-09: qty 1

## 2023-08-09 MED ORDER — ADULT MULTIVITAMIN W/MINERALS CH
1.0000 | ORAL_TABLET | Freq: Every day | ORAL | Status: DC
  Administered 2023-08-09 – 2023-08-10 (×2): 1
  Filled 2023-08-09 (×2): qty 1

## 2023-08-09 MED ORDER — ENSURE ENLIVE PO LIQD
237.0000 mL | Freq: Two times a day (BID) | ORAL | Status: DC
  Administered 2023-08-09 – 2023-08-18 (×17): 237 mL via ORAL

## 2023-08-09 NOTE — Progress Notes (Signed)
NEUROLOGY CONSULT FOLLOW UP NOTE   Date of service: August 09, 2023 Patient Name: Kendra Watts MRN:  [id number] DOB:  1946/02/03  Interval Hx/subjective   No acute events Vitals   Vitals:   08/09/23 1100 08/09/23 1106 08/09/23 1200 08/09/23 1300  BP: (!) 127/47 (!) 127/47 121/62 (!) 126/48  Pulse: 73 72 60 (!) 50  Resp: (!) 25  19 (!) 21  Temp:      TempSrc:      SpO2: 100%  96% 100%  Weight:      Height:         Body mass index is 17.22 kg/m.  Physical Exam   Constitutional: Appears well-developed and well-nourished.  Eyes: No scleral injection.  HENT: No OP obstrucion.  Head: Normocephalic.  Cardiovascular: SB/SR on monitor.  Respiratory: Effort normal, non-labored breathing. 2L [place].  Skin: WDI.   Neurologic Examination   Neuro: Mental Status: Patient is drowsy. Opens eyes to voice and follows simple commands.  She is confused to month.  She responds to questions with intermittent delayed speech.  Object naming, repetition intact.  Cranial Nerves: II: Visual Fields are full. Pupils are equal, round, and reactive to light.   III,IV, VI: EOMI without ptosis or diploplia.  V: Facial sensation is symmetric to temperature VII: Facial movement is symmetric.  VIII: hearing is intact to voice X: Uvula elevates symmetrically XI: Shoulder shrug is symmetric. XII: tongue is midline without atrophy or fasciculations.  Motor: Tone is normal. Bulk is decreased.  Moves all extremities spontaneously, no focal weakness appreciated.  Sensory: Sensation is symmetric to light touch  in the arms and legs. Cerebellar: FNF intact bilaterally Fine motor and RAM intact.   Medications  Current Facility-Administered Medications:    arformoterol (BROVANA) nebulizer solution 15 mcg, 15 mcg, Nebulization, BID, Lorin Glass, MD, 15 mcg at 08/09/23 1124   aspirin chewable tablet 81 mg, 81 mg, Oral, Daily, Lorin Glass, MD, 81 mg at 08/09/23 1222   budesonide (PULMICORT)  nebulizer solution 0.5 mg, 0.5 mg, Nebulization, BID, Lorin Glass, MD, 0.5 mg at 08/09/23 0820   Chlorhexidine Gluconate Cloth 2 % PADS 6 each, 6 each, Topical, Daily, Lorin Glass, MD, 6 each at 08/09/23 1011   dexmedetomidine (PRECEDEX) 400 MCG/100ML (4 mcg/mL) infusion, 0-1.2 mcg/kg/hr, Intravenous, Titrated, Elmaghraby, Olga Coaster, MD, Stopped at 08/09/23 1039   docusate sodium (COLACE) capsule 100 mg, 100 mg, Oral, BID PRN, Lorin Glass, MD   docusate sodium (COLACE) capsule 100 mg, 100 mg, Oral, BID, Lorin Glass, MD, 100 mg at 08/09/23 1106   feeding supplement (ENSURE ENLIVE / ENSURE PLUS) liquid 237 mL, 237 mL, Oral, BID BM, Lorin Glass, MD, 237 mL at 08/09/23 1427   heparin injection 5,000 Units, 5,000 Units, Subcutaneous, Q12H, Lorin Glass, MD, 5,000 Units at 08/09/23 1106   hydrALAZINE (APRESOLINE) injection 20 mg, 20 mg, Intravenous, Q6H PRN, Massie Maroon, MD, 20 mg at 08/09/23 0631   levETIRAcetam (KEPPRA) tablet 500 mg, 500 mg, Oral, BID, Luciano Cutter, MD, 500 mg at 08/09/23 1106   levothyroxine (SYNTHROID) tablet 88 mcg, 88 mcg, Oral, QAC breakfast, Lorin Glass, MD   magnesium sulfate IVPB 2 g 50 mL, 2 g, Intravenous, Once, Reome, Earle J, RPH   metoprolol succinate (TOPROL-XL) 24 hr tablet 50 mg, 50 mg, Oral, Daily, Lorin Glass, MD, 50 mg at 08/09/23 1106   multivitamin with minerals tablet 1 tablet, 1 tablet, Per Tube, Daily, Lorin Glass,  MD, 1 tablet at 08/09/23 1106   norepinephrine (LEVOPHED) 4mg  in (0.016 mg/mL) premix infusion, 2-10 mcg/min, Intravenous, Titrated, Lorin Glass, MD   ondansetron [facility]) injection 4 mg, 4 mg, Intravenous, Q6H PRN, Lorin Glass, MD   Oral care mouth rinse, 15 mL, Mouth Rinse, PRN, Lorin Glass, MD   pantoprazole (PROTONIX) EC tablet 40 mg, 40 mg, Oral, QHS, Lorin Glass, MD   piperacillin-tazobactam (ZOSYN) IVPB 3.375 g, 3.375 g, Intravenous, Q8H, Lorin Glass, MD, Last Rate: 12.5 mL/hr at  08/09/23 1434, 3.375 g at 08/09/23 1434   polyethylene glycol (MIRALAX / GLYCOLAX) packet 17 g, 17 g, Oral, Daily PRN, Lorin Glass, MD   revefenacin (YUPELRI) nebulizer solution 175 mcg, 175 mcg, Nebulization, Daily, Lorin Glass, MD, 175 mcg at 08/09/23 1610   rosuvastatin (CRESTOR) tablet 10 mg, 10 mg, Oral, Daily, Lorin Glass, MD, 10 mg at 08/09/23 1107  Labs and Diagnostic Imaging   CBC:  Recent Labs  Lab 08/08/23 0922 08/08/23 1117 08/09/23 0813  WBC 12.7*  --  16.7*  HGB 7.5* 8.8* 7.2*  HCT 26.1* 26.0* 24.0*  MCV 94.6  --  90.2  PLT 284  --  274    Basic Metabolic Panel:  Lab Results  Component Value Date   NA 142 08/09/2023   K 4.2 08/09/2023   CO2 39 (H) 08/09/2023   GLUCOSE 73 08/09/2023   BUN 16 08/09/2023   CREATININE 1.14 (H) 08/09/2023   CALCIUM 8.6 (L) 08/09/2023   GFRNONAA 50 (L) 08/09/2023   GFRAA >60 04/12/2020   Lipid Panel:  Lab Results  Component Value Date   LDLCALC 45 07/12/2023   HgbA1c:  Lab Results  Component Value Date   HGBA1C 5.6 11/12/2021   Urine Drug Screen:     Component Value Date/Time   LABOPIA NONE DETECTED 11/12/2021 1807   COCAINSCRNUR NONE DETECTED 11/12/2021 1807   LABBENZ POSITIVE (A) 11/12/2021 1807   AMPHETMU NONE DETECTED 11/12/2021 1807   THCU NONE DETECTED 11/12/2021 1807   LABBARB NONE DETECTED 11/12/2021 1807    Alcohol Level No results found for: "ETH" INR  Lab Results  Component Value Date   INR 1.1 08/08/2023   APTT  Lab Results  Component Value Date   APTT 28 07/11/2023    CT Head without contrast(Personally reviewed): - No evidence of acute intracranial abnormality - Parenchymal atrophy and chronic small vessel ischemic disease  LTM EEG 08/08/23 2356 to 08/09/23 0520:  Suggestive of moderate diffuse encephalopathy. No seizures or epileptiform discharges were seen throughout the recording.   Assessment   Kendra Watts is a 78 y.o. 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 She was found unresponsive for 15 mins. She was bagged enroute and intubated in the ED and admitted to the ICU. She was extubated and was transferred to the floor. On the floor, she was noted to have episodes of starring off into space and would be mute during these events. She would withdraw to pain. She had multiple episodes and inbetween would be communicative.   Lactic acid was elevated on admission. This could be due to infection or due to seizure. Blood cultures negative preliminary. Resp panel negative. UA pending.   She was placed on  LTM EEG 1/17 pm, this has been negative. Given prior left occipital lobe hemorrhage, she is at risk for seizures. She got a load of Valproic acid and her home Keppra was continued.   Breakthrough seizures secondary to hypercapnia versus possible infectious process  Recommendations   - continue home Keppra 500mg  BID - Continue LTM EEG  If negative on tomorrow am read, will likely DC - MRI Brain after EEG ______________________________________________________________________   Signed, Lynnae January, NP [facility]   I have seen the patient reviewed the above note.  We will continue monitoring her EEG to see if we can capture any of these staring spells that have been of concern.  Neurology will follow  Ritta Slot, MD [facility] [phone]  If 7pm- 7am, please page neurology on call as listed in AMION.

## 2023-08-09 NOTE — H&P (Signed)
NAME:  Kendra Watts, MRN:  [id number], DOB:  17-Feb-1946, LOS: 1 ADMISSION DATE:  08/08/2023, CONSULTATION DATE:  08/08/23 REFERRING MD:  EDP, CHIEF COMPLAINT:  AMS   History of Present Illness:  78 year old woman with hx of O2-dependent COPD, bilateral lung cancer with prior SBRT, FTT, severe protein calorie malnutrition, prior seizures presenting after being found obtunded at [facility].  Intubated in ER and PCCM consulted.  Pertinent  Medical History   Past Medical History:  Diagnosis Date   Anemia    Aortic arch anomaly    arteria lusoria    Breast cancer (HCC) 09/22/2015   Malignant   Breast cancer of upper-outer quadrant of left female breast (HCC) 09/08/2015   Cataract, immature    CHF (congestive heart failure) (HCC)    Acute CHF-06/2018   COPD (chronic obstructive pulmonary disease) (HCC)    Encephalopathy acute 11/12/2021   Femur fracture, left (HCC) 04/11/2020   left femur fracture( greater trochanter   Heart murmur    states no known problems   History of cervical spine x-ray 11/13/2021   History of hyperthyroidism    History of radiation therapy    bilateral lungs - 10/27/18-11/02/18, Dr. Antony Blackbird   History of radiation therapy 11/28/2015   left breast 10/30/2015-11/28/2015   Dr Antony Blackbird   Hyperlipidemia    Hypertension    states under control with meds., has been on med. x "long time"   Hypokalemia    from last physical.    Hypothyroidism    Nonfunctioning kidney    left   Personal history of radiation therapy    2017   Pulmonary nodules    Bilateral   Radiation 10/30/15-11/28/15   left breast 42.72 Gy, boosted to 10 Gy   Renal artery stenosis (HCC)    Tobacco abuse    Wears partial dentures    upper and lower     Significant Hospital Events: Including procedures, antibiotic start and stop dates in addition to other pertinent events   1/17 admit  Interim History / Subjective:  Episodes of unresponsiveness and associated hypoxia. Extubated yesterday  evening but needed BiPAP again for same overnight.  No clear seizures noted.   Objective   Blood pressure (!) 127/47, pulse 72, temperature 99 F (37.2 C), temperature source Axillary, resp. rate (!) 24, height 5' (1.524 m), weight 40 kg, SpO2 92%.    Vent Mode: PSV;BIPAP FiO2 (%):  [60 %] 60 % Set Rate:  [18 bmp] 18 bmp PEEP:  [5 cmH20] 5 cmH20 Pressure Support:  [12 cmH20] 12 cmH20   Intake/Output Summary (Last 24 hours) at 08/09/2023 1158 Last data filed at 08/09/2023 0500 Gross per 24 hour  Intake 1312.92 ml  Output --  Net 1312.92 ml   Filed Weights   08/08/23 0938 08/08/23 1059  Weight: 40 kg 40 kg    Examination: General: very frail appearing.  HENT: BiPAP mask in place with no leak Lungs: clear, no wheezing Cardiovascular: HS normal Abdomen: soft, +BS Extremities: +muscle wasting Neuro: not much movement to command or painful stimulation.   Ancillary Tests Personally Reviewed:  No significant electrolyte abnormalities  WBC: 16.7, HB 7.2  Assessment & Plan:  AMS of unclear etiology, absence type seizures possible but no EEG correlates Acute on chronic hypoxemic and hypercarbic resp failure Possible aspiration pneumonia.  Hx pulmonary cachexia, FTT, possible recurrent lung cancer Severe COPD Anemia, acute on chronic- no s/s of bleeding  Plan:   - Continue EEG monitoring  for now and current anticonvulsants .  - BIPAP as needed  - Complete course of antibiotics.  - Continue current bronchodilators. No steroids as doesn't appear exacerbated.  - DNR per conversation with PMT   Best Practice (right click and "Reselect all SmartList Selections" daily)   Diet/type: NPO DVT prophylaxis prophylactic heparin  Pressure ulcer(s): N/A GI prophylaxis: PPI Lines: N/A Foley:  N/A Code Status:  full code Last date of multidisciplinary goals of care discussion [pending]  Lynnell Catalan, MD [facility] ICU Physician [facility]  Pager: [phone] Or  Epic Secure Chat After hours: [phone].  08/09/2023, 12:12 PM

## 2023-08-09 NOTE — Progress Notes (Signed)
Initial Nutrition Assessment  DOCUMENTATION CODES:   Underweight  INTERVENTION:   - Ensure Enlive po BID, each supplement provides 350 kcal and 20 grams of protein  - Magic Cup BID with lunch and dinner meals, each supplement provides 290 kcal and 9 grams of protein  - MVI with minerals daily  NUTRITION DIAGNOSIS:   Increased nutrient needs related to chronic illness as evidenced by estimated needs.  GOAL:   Patient will meet greater than or equal to 90% of their needs  MONITOR:   PO intake, Supplement acceptance, Labs, Weight trends  REASON FOR ASSESSMENT:   Consult Assessment of nutrition requirement/status  ASSESSMENT:   78 year old female who presented to the ED from SNF on 1/17 with respiratory distress. PMH of COPD, bilateral lung cancer with prior SBRT, FTT, severe malnutrition, prior seizures, prior L occipital lobe hemorrhagic infarcts. Pt admitted with AMS, acute on chronic hypoxemic and hypercarbic respiratory failure.  01/17 - admission, intubated, later extubated, diet advanced to dysphagia 3 with thin liquids  RD consulted for nutrition assessment. Pt was on BiPAP overnight, currently on HFNC. Pt with dysphagia 3 diet ordered; no meal completions charted at this time.  Unable to obtain diet and weight history at this time. Reviewed weight history in chart. Current weight of 40 kg is up from 36.7 kg in October 2024. Pt with a history of severe malnutrition. Suspect severe malnutrition persists but unable to confirm without NFPE.  RD will order oral nutrition supplements to aid pt in meeting kcal and protein needs. Will also order daily MVI with minerals.  Noted Palliative Medicine Consult has been ordered.  Medications reviewed and include: colace, protonix, IV abx, precedex gtt  Labs reviewed: creatinine 1.14, lactic acid 2.2, WBC 16.7, hemoglobin 7.2  NUTRITION - FOCUSED PHYSICAL EXAM:  Unable to complete at this time. RD working remotely.  Diet  Order:   Diet Order             DIET DYS 3 Room service appropriate? Yes; Fluid consistency: Thin  Diet effective now                   EDUCATION NEEDS:   Not appropriate for education at this time  Skin:  Skin Assessment: Reviewed RN Assessment  Last BM:  no documented BM  Height:   Ht Readings from Last 1 Encounters:  08/08/23 5' (1.524 m)    Weight:   Wt Readings from Last 1 Encounters:  08/08/23 40 kg    BMI:  Body mass index is 17.22 kg/m.  Estimated Nutritional Needs:   Kcal:  1400-1600  Protein:  60-75 grams  Fluid:  >1.4 L    Mertie Clause, MS, RD, LDN Registered Dietitian II Please see AMiON for contact information.

## 2023-08-09 NOTE — Evaluation (Signed)
Occupational Therapy Evaluation Patient Details Name: Kendra Watts MRN: [id number] DOB: 1946/03/20 Today's Date: 08/09/2023   History of Present Illness Pt is a 78 y.o. female who presented 08/08/23 after being found unresponsive in respiratory distress. Pt bagged enroute and intubated in ED and extubated same day. Concern noted for possible absence seizures. Rapid response called for episode of unresponsiveness 1/17. PMH: breast CA, COPD on chronic home 4L O2, CHF, HTN, L hip fx, L occipital lobe hemorrhagic infarcts, anemia, HLD   Clinical Impression   Pt admitted from SNF rehab with above diagnoses. Prior to DC to SNF in late Dec 2024, pt was living at home alone and Modified Independent with ADLs/mobility using a walker. Pt presents now with deficits in cognition, balance, strength and endurance. Pt primarily limited by cognition today w/ attention and awareness deficits. Pt required Mod-Max A for bed mobility but able to sit unsupported without assist. Pt deferred OOB transfer d/t feeling cold and wanting to keep blankets on self. Pt requires Max-Total A for ADLs at this time. Recommend discharge back to postacute rehab venue to work on maximize safety and independence with daily tasks.       If plan is discharge home, recommend the following: A lot of help with bathing/dressing/bathroom;Assistance with cooking/housework;Direct supervision/assist for medications management;Assist for transportation;Help with stairs or ramp for entrance;Direct supervision/assist for financial management;A lot of help with walking and/or transfers    Functional Status Assessment  Patient has had a recent decline in their functional status and demonstrates the ability to make significant improvements in function in a reasonable and predictable amount of time.  Equipment Recommendations  Other (comment) (TBD)    Recommendations for Other Services       Precautions / Restrictions Precautions Precautions:  Fall Precaution Comments: monitor O2 (supplemental O2 at baseline) Restrictions Weight Bearing Restrictions Per Provider Order: No      Mobility Bed Mobility Overal bed mobility: Needs Assistance Bed Mobility: Supine to Sit, Sit to Supine     Supine to sit: Mod assist, HOB elevated Sit to supine: Max assist   General bed mobility comments: pt diffficulty attending and initiating- able to bring LE partially to EOB and lifting trunk with cues. Max A to return to bed d/t difficulty initiating    Transfers                   General transfer comment: deferred as pt did not want to remove all of her blankets due to being cold      Balance Overall balance assessment: Needs assistance Sitting-balance support: Feet unsupported, No upper extremity supported Sitting balance-Leahy Scale: Fair                                     ADL either performed or assessed with clinical judgement   ADL Overall ADL's : Needs assistance/impaired Eating/Feeding: Moderate assistance;Bed level;Minimal assistance Eating/Feeding Details (indicate cue type and reason): per nursing report assisting with meds/water, also reported desat when drinking water Grooming: Moderate assistance;Bed level;Sitting   Upper Body Bathing: Maximal assistance;Bed level;Sitting   Lower Body Bathing: Total assistance;Bed level   Upper Body Dressing : Maximal assistance;Bed level   Lower Body Dressing: Total assistance;Bed level Lower Body Dressing Details (indicate cue type and reason): assist for socks (pt reported being cold)     Toileting- Clothing Manipulation and Hygiene: Total assistance;Bed level  General ADL Comments: Limited primarily by confusion     Vision Baseline Vision/History: 1 Wears glasses Ability to See in Adequate Light: 0 Adequate Patient Visual Report: No change from baseline Vision Assessment?: No apparent visual deficits     Perception         Praxis          Pertinent Vitals/Pain Pain Assessment Pain Assessment: No/denies pain     Extremity/Trunk Assessment Upper Extremity Assessment Upper Extremity Assessment: Generalized weakness;Right hand dominant;Difficult to assess due to impaired cognition   Lower Extremity Assessment Lower Extremity Assessment: Defer to PT evaluation   Cervical / Trunk Assessment Cervical / Trunk Assessment: Normal   Communication Communication Communication: Hearing impairment Cueing Techniques: Verbal cues;Gestural cues   Cognition Arousal: Alert Behavior During Therapy: Restless Overall Cognitive Status: Impaired/Different from baseline Area of Impairment: Orientation, Memory, Attention, Following commands, Awareness, Safety/judgement, Problem solving                 Orientation Level: Disoriented to, Place, Situation, Time Current Attention Level: Focused, Sustained Memory: Decreased short-term memory Following Commands: Follows one step commands inconsistently, Follows one step commands with increased time Safety/Judgement: Decreased awareness of safety, Decreased awareness of deficits Awareness: Intellectual Problem Solving: Slow processing, Decreased initiation, Difficulty sequencing, Requires verbal cues General Comments: pleasantly confused and tangential throughout (asking therapist if they needed to go to other floor with holes in it, need to talk to that other guy before we move, etc). Inconsistent command following due to distractibility. Able to report January (but pt surprised she remembered this), unable to report year or where she was or why     General Comments  RN increasing O2 on OT entry due to desat w/ drinking water. on 5 L O2, spO2 WFL    Exercises     Shoulder Instructions      Home Living Family/patient expects to be discharged to:: Skilled nursing facility Living Arrangements: Alone Available Help at Discharge: Friend(s);Available PRN/intermittently Type of  Home: Apartment Home Access: Stairs to enter     Home Layout: One level               Home Equipment: Agricultural consultant (2 wheels);Grab bars - tub/shower   Additional Comments: DC to [facility] 07/18/23 after admission at [facility]. prior to this, pt was living at home alone      Prior Functioning/Environment Prior Level of Function : Needs assist;Patient poor historian/Family not available             Mobility Comments: uses a walker per prior admission ADLs Comments: mod ind ADLs. Friends help with transportation, grocery shopping per prior admission        OT Problem List: Decreased strength;Impaired balance (sitting and/or standing);Decreased activity tolerance;Decreased cognition;Decreased safety awareness;Decreased knowledge of use of DME or AE;Cardiopulmonary status limiting activity      OT Treatment/Interventions: Self-care/ADL training;Therapeutic exercise;DME and/or AE instruction;Patient/family education;Balance training;Therapeutic activities    OT Goals(Current goals can be found in the care plan section) Acute Rehab OT Goals Patient Stated Goal: have some water to drink OT Goal Formulation: With patient Time For Goal Achievement: 08/23/23 Potential to Achieve Goals: Fair ADL Goals Pt Will Perform Grooming: with supervision;sitting Pt Will Perform Lower Body Bathing: with mod assist;sit to/from stand;sitting/lateral leans Pt Will Perform Lower Body Dressing: with mod assist;sitting/lateral leans;sit to/from stand Pt Will Transfer to Toilet: with min assist;stand pivot transfer;bedside commode Additional ADL Goal #1: Pt to sustain attention to basic ADL tasks > 3  min without verbal cues  OT Frequency: Min 1X/week    Co-evaluation              AM-PAC OT "6 Clicks" Daily Activity     Outcome Measure Help from another person eating meals?: A Lot Help from another person taking care of personal grooming?: A Lot Help from another person  toileting, which includes using toliet, bedpan, or urinal?: A Lot Help from another person bathing (including washing, rinsing, drying)?: A Lot Help from another person to put on and taking off regular upper body clothing?: A Lot Help from another person to put on and taking off regular lower body clothing?: A Lot 6 Click Score: 12   End of Session Equipment Utilized During Treatment: Oxygen Nurse Communication: Mobility status  Activity Tolerance: Other (comment) (limited by cognition) Patient left: in bed;with call bell/phone within reach;with bed alarm set  OT Visit Diagnosis: Unsteadiness on feet (R26.81);Other abnormalities of gait and mobility (R26.89);History of falling (Z91.81);Other symptoms and signs involving cognitive function                Time: 1610-9604 OT Time Calculation (min): 23 min Charges:  OT General Charges $OT Visit: 1 Visit OT Evaluation $OT Eval Moderate Complexity: 1 Mod  Bradd Canary, OTR/L Acute Rehab Services Office: [phone]   Lorre Munroe 08/09/2023, 12:38 PM

## 2023-08-09 NOTE — Progress Notes (Signed)
Pt. Found off Bipap and on  15L

## 2023-08-09 NOTE — Progress Notes (Signed)
     Referral received for Kendra Watts: goals of care discussion. Chart reviewed and updates received from RN. Patient assessed and she is unable to engage appropriately in discussions at this time. Will try tomorrow 08/08/22. Detailed note and recommendations to follow once GOC has been completed.   Thank you for your referral and allowing PMT to assist in [name] care.   Sarina Ser, NP Palliative Medicine Team  Team Phone # [phone]   NO CHARGE

## 2023-08-09 NOTE — Evaluation (Signed)
Physical Therapy Evaluation Patient Details Name: Kendra Watts MRN: [id number] DOB: [birth year] Today's Date: 08/09/2023  History of Present Illness  Pt is a 78 y.o. female who presented 08/08/23 after being found unresponsive in respiratory distress. Pt bagged enroute and intubated in ED and extubated same day. Concern noted for possible absence seizures. Rapid response called for episode of unresponsiveness 1/17. PMH: breast CA, COPD on chronic home 4L O2, CHF, HTN, L hip fx, L occipital lobe hemorrhagic infarcts, anemia, HLD   Clinical Impression  Pt presents with condition above and deficits mentioned below, see PT Problem List. Pt has recently been at a SNF. Prior to D/C to SNF in late Dec 2024, pt was living at home alone and Modified Independent with ADLs/mobility using a walker. Currently, pt is demonstrating deficits in cognition, balance, gross strength, and activity tolerance. She required modA for bed mobility, minA to transfer to stand, and min-modA to take a few steps with bil HHA. She would benefit from further inpatient rehab, < 3 hours/day. Will continue to follow acutely.      If plan is discharge home, recommend the following: Assistance with cooking/housework;Assist for transportation;Help with stairs or ramp for entrance;A lot of help with walking and/or transfers;A lot of help with bathing/dressing/bathroom;Direct supervision/assist for medications management;Direct supervision/assist for financial management   Can travel by private vehicle   Yes    Equipment Recommendations Other (comment) (defer to next venue of care)  Recommendations for Other Services       Functional Status Assessment Patient has had a recent decline in their functional status and demonstrates the ability to make significant improvements in function in a reasonable and predictable amount of time.     Precautions / Restrictions Precautions Precautions: Fall Precaution Comments: monitor O2  (supplemental O2 at baseline); EEG Restrictions Weight Bearing Restrictions Per Provider Order: No      Mobility  Bed Mobility Overal bed mobility: Needs Assistance Bed Mobility: Supine to Sit, Sit to Supine, Rolling Rolling: Min assist, Used rails   Supine to sit: Mod assist, HOB elevated Sit to supine: Mod assist, HOB elevated   General bed mobility comments: Pt reaching for therapist's hands to pull up to sit L EOB, modA at trunk needed to ascend and pivot hips to sit up. ModA needed to lift legs and direct trunk back to supine. MinA to roll using rails    Transfers Overall transfer level: Needs assistance Equipment used: 1 person hand held assist Transfers: Sit to/from Stand Sit to Stand: Min assist           General transfer comment: Pt wrapping her arms around therapist anterior to her for support to pull up to stand from EOB, minA.    Ambulation/Gait Ambulation/Gait assistance: Min assist, Mod assist Gait Distance (Feet): 2 Feet Assistive device: 1 person hand held assist Gait Pattern/deviations: Step-to pattern, Decreased step length - right, Decreased step length - left, Decreased stride length, Decreased weight shift to right, Decreased weight shift to left Gait velocity: reduced Gait velocity interpretation: <1.31 ft/sec, indicative of household ambulator   General Gait Details: Pt took a few steps at EOB with HHA on therapist anterior to her, needing tactile cues for weight shifting to advance steps.  Stairs            Wheelchair Mobility     Tilt Bed    Modified Rankin (Stroke Patients Only)       Balance Overall balance assessment: Needs assistance Sitting-balance support: Feet unsupported, No  upper extremity supported Sitting balance-Leahy Scale: Fair Sitting balance - Comments: static sitting EOB with CGA for safety   Standing balance support: Bilateral upper extremity supported, During functional activity Standing balance-Leahy Scale:  Poor Standing balance comment: Reliant on UE support and external physical assistance                             Pertinent Vitals/Pain Pain Assessment Pain Assessment: Faces Faces Pain Scale: Hurts little more Pain Location: buttocks with bowel movement and positioning on bed pan Pain Descriptors / Indicators: Discomfort, Grimacing, Guarding, Moaning Pain Intervention(s): Limited activity within patient's tolerance, Monitored during session, Repositioned    Home Living Family/patient expects to be discharged to:: Skilled nursing facility Living Arrangements: Alone Available Help at Discharge: Friend(s);Available PRN/intermittently Type of Home: Apartment Home Access: Stairs to enter       Home Layout: One level Home Equipment: Agricultural consultant (2 wheels);Grab bars - tub/shower Additional Comments: DC to [facility] 07/18/23 after admission at [facility]. prior to this, pt was living at home alone    Prior Function Prior Level of Function : Needs assist;Patient poor historian/Family not available             Mobility Comments: uses a walker per prior admission ADLs Comments: mod ind ADLs. Friends help with transportation, grocery shopping per prior admission     Extremity/Trunk Assessment   Upper Extremity Assessment Upper Extremity Assessment: Defer to OT evaluation    Lower Extremity Assessment Lower Extremity Assessment: Generalized weakness    Cervical / Trunk Assessment Cervical / Trunk Assessment: Normal  Communication   Communication Communication: Hearing impairment Cueing Techniques: Verbal cues;Tactile cues;Gestural cues  Cognition Arousal: Alert Behavior During Therapy: Restless Overall Cognitive Status: Impaired/Different from baseline Area of Impairment: Orientation, Memory, Attention, Following commands, Awareness, Safety/judgement, Problem solving                 Orientation Level: Disoriented to, Place, Situation,  Time Current Attention Level: Focused, Sustained Memory: Decreased short-term memory Following Commands: Follows one step commands inconsistently, Follows one step commands with increased time Safety/Judgement: Decreased awareness of safety, Decreased awareness of deficits Awareness: Intellectual Problem Solving: Slow processing, Decreased initiation, Difficulty sequencing, Requires verbal cues General Comments: pleasantly confused and tangential throughout (asking "why do you care about me" and talking repeatedly about just recently being in the hospital). Follows simple cues ~75% of time. Slow to process cues and needs redirecting often        General Comments General comments (skin integrity, edema, etc.): VSS on 6L HFNC    Exercises     Assessment/Plan    PT Assessment Patient needs continued PT services  PT Problem List Decreased strength;Decreased activity tolerance;Decreased balance;Decreased mobility;Decreased cognition;Cardiopulmonary status limiting activity       PT Treatment Interventions DME instruction;Gait training;Functional mobility training;Therapeutic activities;Therapeutic exercise;Patient/family education;Balance training;Neuromuscular re-education;Cognitive remediation    PT Goals (Current goals can be found in the Care Plan section)  Acute Rehab PT Goals Patient Stated Goal: to get stronger PT Goal Formulation: With patient Time For Goal Achievement: 08/23/23 Potential to Achieve Goals: Good    Frequency Min 1X/week     Co-evaluation               AM-PAC PT "6 Clicks" Mobility  Outcome Measure Help needed turning from your back to your side while in a flat bed without using bedrails?: A Little Help needed moving from lying on your back  to sitting on the side of a flat bed without using bedrails?: A Lot Help needed moving to and from a bed to a chair (including a wheelchair)?: A Lot Help needed standing up from a chair using your arms (e.g.,  wheelchair or bedside chair)?: A Little Help needed to walk in hospital room?: Total Help needed climbing 3-5 steps with a railing? : Total 6 Click Score: 12    End of Session Equipment Utilized During Treatment: Oxygen Activity Tolerance: Patient tolerated treatment well;Patient limited by fatigue Patient left: in bed;with call bell/phone within reach;with bed alarm set Nurse Communication: Mobility status;Other (comment) (pt on bed pan) PT Visit Diagnosis: Muscle weakness (generalized) (M62.81);Unsteadiness on feet (R26.81);Other abnormalities of gait and mobility (R26.89);Difficulty in walking, not elsewhere classified (R26.2)    Time: 4098-1191 PT Time Calculation (min) (ACUTE ONLY): 28 min   Charges:   PT Evaluation $PT Eval Moderate Complexity: 1 Mod PT Treatments $Therapeutic Activity: 8-22 mins PT General Charges $$ ACUTE PT VISIT: 1 Visit         Virgil Benedict, PT, DPT Acute Rehabilitation Services  Office: [phone]   Bettina Gavia 08/09/2023, 5:15 PM

## 2023-08-09 NOTE — Procedures (Signed)
Patient Name: Kendra Watts  MRN: [id number]  Epilepsy Attending: Charlsie Quest  Referring Physician/Provider: Erick Blinks, MD  Duration: 08/08/2023 2356 to 08/09/2023 2356  Patient history: 78yo F with ams getting eeg to evaluate for seizure   Level of alertness: Awake/lethargic, asleep   AEDs during EEG study: LEV   Technical aspects: This EEG study was done with scalp electrodes positioned according to the 10-20 International system of electrode placement. Electrical activity was reviewed with band pass filter of 1-70Hz , sensitivity of 7 uV/mm, display speed of 72mm/sec with a 60Hz  notched filter applied as appropriate. EEG data were recorded continuously and digitally stored.  Video monitoring was available and reviewed as appropriate.   Description:  No clear posterior dominant rhythm was seen. Sleep was characterized by sleep spindles (12 to 14 Hz), maximal frontocentral region. EEG showed continuous generalized predominantly 5 to 7 Hz theta slowing admixed with intermittent generalized 2-3Hz  delta slowing, at times with triphasic morphology. Hyperventilation and photic stimulation were not performed.      ABNORMALITY - Continuous slow, generalized   IMPRESSION: This study is suggestive of moderate diffuse encephalopathy. No seizures or epileptiform discharges were seen throughout the recording.   Hadley Soileau Annabelle Harman

## 2023-08-09 NOTE — Progress Notes (Signed)
RT NOTE: patient removed from bipap and placed on 8L salter high flow nasal cannula.  Tolerating well at this time with no respiratory distress noted.  Will continue to monitor and assess for bipap needs.

## 2023-08-09 NOTE — Consult Note (Signed)
Palliative Care Consult Note                                  Date: 08/09/2023   Patient Name: Kendra Watts  DOB: 1945-09-10  MRN: [id number]  Age / Sex: 78 y.o., female  PCP: Georgianne Fick, MD Referring Physician: Lynnell Catalan, MD  Reason for Consultation: Establishing goals of care  HPI/Patient Profile: 78 y.o. 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 Pt bagged enroute and intubated in ED and extubated same day. Concern noted for possible absence seizures. Rapid response called for episode of unresponsiveness 08/07/22.  Possible recurrent lung cancer (08/06/2022).  Past Medical History:  Diagnosis Date   Anemia    Aortic arch anomaly    arteria lusoria    Breast cancer (HCC) 09/22/2015   Malignant   Breast cancer of upper-outer quadrant of left female breast (HCC) 09/08/2015   Cataract, immature    CHF (congestive heart failure) (HCC)    Acute CHF-06/2018   COPD (chronic obstructive pulmonary disease) (HCC)    Encephalopathy acute 11/12/2021   Femur fracture, left (HCC) 04/11/2020   left femur fracture( greater trochanter   Heart murmur    states no known problems   History of cervical spine x-ray 11/13/2021   History of hyperthyroidism    History of radiation therapy    bilateral lungs - 10/27/18-11/02/18, Dr. Antony Blackbird   History of radiation therapy 11/28/2015   left breast 10/30/2015-11/28/2015   Dr Antony Blackbird   Hyperlipidemia    Hypertension    states under control with meds., has been on med. x "long time"   Hypokalemia    from last physical.    Hypothyroidism    Nonfunctioning kidney    left   Personal history of radiation therapy    2017    Pulmonary nodules    Bilateral   Radiation 10/30/15-11/28/15   left breast 42.72 Gy, boosted to 10 Gy   Renal artery stenosis (HCC)    Tobacco abuse    Wears partial dentures    upper and lower    Subjective:   I have reviewed medical records including EPIC notes, labs and imaging, received report from patient's nurse, assessed the patient, and then spoke with her sister (next of kin) Kendra Watts to discuss diagnosis prognosis, GOC, EOL wishes, disposition and options. The patient was unable to participate in the GOC conversations due to her confusion/disorientation. She was unable to tell me where she was or why she was there.  I introduced Palliative Medicine as specialized medical care for people living with serious illness. It focuses on providing relief from symptoms and stress of a serious illness. The goal is to improve quality of life for both the patient and the family.  Patient's family face treatment option decisions, advanced directive decisions, and anticipatory care needs.  Today's Discussion: The patient's sister shared her understanding of the patient's chronic health conditions. I shared my understanding of her health conditions including her possible recurrent lung cancer and current hospitalization. Her sister shares that the patient has "so much going on with her body." The patient has been living at [facility]. She has been using oxygen and a  walker at baseline. Kendra Watts shares that sometimes when she is on the phone with the patient she notices the patient has periods of confusion. She shares that she is worried that the patient will never be strong enough to live on her own again. I share that I am also concerned about the patient's failure to thrive.   When I spoke to the patient she told me she has two sisters who could help her make healthcare decisions: one in [place] and one who lives closer. I called the sister who was listed in the chart Kendra Watts) and she shared  she is the patient's only living sister. She is not married and does not have children. Kendra Watts shares that the patient previously told her that she would be in charge of her medical decisions if she could not make them herself.   A discussion was had today regarding advanced directives. We discussed code status and scopes of care. I shared that the patient has a signed DNR from May 2023 in her chart but she is currently listed as a FULL code. Recommended consideration of DNR status, understanding evidenced-based poor outcomes in similar hospitalized patients, as the cause of the arrest is likely associated with chronic/terminal disease rather than a reversible acute cardio-pulmonary event. Kendra Watts is in agreement to change the patient's code status to do not attempt resuscitation-- DNR. Kendra Watts shares that she is "not ready to let her go but the lord will take her when it's time." Kendra Watts also shares that this is the decision she believes the patient would make if she were able to. We discuss scopes of care and the difference between a aggressive medical intervention path and a palliative comfort care path. Kendra Watts is not ready to make any decisions around scope of care. Kendra Watts is hopeful the patient will soon have less confusion and will be able to participate in goals of care conversations. However, she is plans to call the patient's nephew Rocky Link to get his thoughts and discuss goals of care further. For now the patient remains full scope of care.   Discussed the importance of continued conversation with family and the medical providers regarding overall plan of care and treatment options, ensuring decisions are within the context of the patient's values and GOCs.  Questions and concerns were addressed. The family was encouraged to call with questions or concerns. PMT will continue to support holistically.  Review of Systems  Unable to perform ROS   Objective:   Primary Diagnoses: Present on Admission:  AMS  (altered mental status)   Physical Exam Vitals reviewed.  Constitutional:      Appearance: She is ill-appearing.     Interventions: Nasal cannula in place.     Comments: EEG  HENT:     Head: Normocephalic and atraumatic.  Cardiovascular:     Rate and Rhythm: Normal rate.  Pulmonary:     Effort: Tachypnea present.  Skin:    General: Skin is warm and dry.  Neurological:     Mental Status: She is alert. She is disoriented.     Vital Signs:  BP (!) 127/47   Pulse 72   Temp 99 F (37.2 C) (Axillary)   Resp (!) 24   Ht 5' (1.524 m)   Wt 40 kg   SpO2 92%   BMI 17.22 kg/m     Advanced Care Planning:   Existing Vynca/ACP Documentation: DNR and Living will  Primary Decision Maker: While the patient is unable to make decisions her sister Kendra Watts  Meeks is her decision maker as she is her only living sister.  Code Status/Advance Care Planning: DNR  Assessment & Plan:   SUMMARY OF RECOMMENDATIONS   DNR Full scope: Continue current scope of care Time for outcomes- to see if she can make GOC decisions Continued PMT support   Discussed with: Dr. Katrinka Blazing, Dr. Denese Killings, and bedside RN  Time Total: 75 minutes  Thank you for allowing [place] to participate in the care of TAYLIANA WINNER PMT will continue to support holistically.   Signed by: Sarina Ser, NP Palliative Medicine Team  Team Phone # [phone] (Nights/Weekends)  08/09/2023, 12:41 PM

## 2023-08-10 ENCOUNTER — Inpatient Hospital Stay (HOSPITAL_COMMUNITY): Payer: Medicare Other

## 2023-08-10 DIAGNOSIS — Z7189 Other specified counseling: Secondary | ICD-10-CM | POA: Diagnosis not present

## 2023-08-10 DIAGNOSIS — J9601 Acute respiratory failure with hypoxia: Secondary | ICD-10-CM | POA: Diagnosis not present

## 2023-08-10 DIAGNOSIS — R569 Unspecified convulsions: Secondary | ICD-10-CM | POA: Diagnosis not present

## 2023-08-10 DIAGNOSIS — J9600 Acute respiratory failure, unspecified whether with hypoxia or hypercapnia: Secondary | ICD-10-CM | POA: Diagnosis not present

## 2023-08-10 DIAGNOSIS — Z515 Encounter for palliative care: Secondary | ICD-10-CM

## 2023-08-10 DIAGNOSIS — R4182 Altered mental status, unspecified: Secondary | ICD-10-CM | POA: Diagnosis not present

## 2023-08-10 LAB — BASIC METABOLIC PANEL
Anion gap: 11 (ref 5–15)
BUN: 22 mg/dL (ref 8–23)
CO2: 40 mmol/L — ABNORMAL HIGH (ref 22–32)
Calcium: 8.6 mg/dL — ABNORMAL LOW (ref 8.9–10.3)
Chloride: 86 mmol/L — ABNORMAL LOW (ref 98–111)
Creatinine, Ser: 0.94 mg/dL (ref 0.44–1.00)
GFR, Estimated: >60 mL/min (ref >60)
Glucose, Bld: 112 mg/dL — ABNORMAL HIGH (ref 70–99)
Potassium: 3.8 mmol/L (ref 3.5–5.1)
Sodium: 137 mmol/L (ref 135–145)

## 2023-08-10 LAB — PHOSPHORUS: Phosphorus: 4.2 mg/dL (ref 2.5–4.6)

## 2023-08-10 LAB — CBC
HCT: 21.3 % — ABNORMAL LOW (ref 36.0–46.0)
Hemoglobin: 6.5 g/dL — CL (ref 12.0–15.0)
MCH: 27.4 pg (ref 26.0–34.0)
MCHC: 30.5 g/dL (ref 30.0–36.0)
MCV: 89.9 fL (ref 80.0–100.0)
Platelets: 285 10*3/uL (ref 150–400)
RBC: 2.37 MIL/uL — ABNORMAL LOW (ref 3.87–5.11)
RDW: 17 % — ABNORMAL HIGH (ref 11.5–15.5)
WBC: 14.5 10*3/uL — ABNORMAL HIGH (ref 4.0–10.5)
nRBC: 0 % (ref 0.0–0.2)

## 2023-08-10 LAB — GLUCOSE, CAPILLARY: Glucose-Capillary: 104 mg/dL — ABNORMAL HIGH (ref 70–99)

## 2023-08-10 LAB — MAGNESIUM: Magnesium: 2.6 mg/dL — ABNORMAL HIGH (ref 1.7–2.4)

## 2023-08-10 LAB — PREPARE RBC (CROSSMATCH)

## 2023-08-10 MED ORDER — ADULT MULTIVITAMIN W/MINERALS CH
1.0000 | ORAL_TABLET | Freq: Every day | ORAL | Status: DC
  Administered 2023-08-11 – 2023-08-18 (×8): 1 via ORAL
  Filled 2023-08-10 (×10): qty 1

## 2023-08-10 MED ORDER — TOPIRAMATE 25 MG PO TABS
50.0000 mg | ORAL_TABLET | Freq: Two times a day (BID) | ORAL | Status: DC
  Administered 2023-08-10 – 2023-08-18 (×16): 50 mg via ORAL
  Filled 2023-08-10 (×16): qty 2

## 2023-08-10 MED ORDER — SODIUM CHLORIDE 0.9% IV SOLUTION: Freq: Once | INTRAVENOUS | Status: AC

## 2023-08-10 MED ORDER — GADOBUTROL 1 MMOL/ML IV SOLN
4.0000 mL | Freq: Once | INTRAVENOUS | Status: AC | PRN
  Administered 2023-08-10: 4 mL via INTRAVENOUS

## 2023-08-10 NOTE — Plan of Care (Signed)

## 2023-08-10 NOTE — TOC Initial Note (Addendum)
Transition of Care [facility]) - Initial/Assessment Note    Patient Details  Name: Kendra Watts MRN: [id number] Date of Birth: [birth year]  [facility] CM/SW Contact:    Marliss Coots, LCSW Phone Number: 08/10/2023, 10:44 AM  Clinical Narrative:                  10:44 AM CSW called patient's sister (patient is not currently fully oriented), [name], regarding therapy recommendation of patient discharge to SNF. [name] informed CSW that patient is from [facility] and expressed interest in her returning there. CSW confirmed with Bjorn Loser of [facility] that patient is to return upon discharge. Patient can discharge to [facility] once medically ready.  Expected Discharge Plan: Skilled Nursing Facility Barriers to Discharge: Continued Medical Work up   Patient Goals and CMS Choice   CMS Medicare.gov Compare Post Acute Care list provided to:: Patient Represenative (must comment) Choice offered to / list presented to : Sibling      Expected Discharge Plan and Services In-house Referral: Clinical Social Work   Post Acute Care Choice: Skilled Nursing Facility Living arrangements for the past 2 months: Apartment, Skilled Nursing Facility                                      Prior Living Arrangements/Services Living arrangements for the past 2 months: Apartment, Skilled Nursing Facility Lives with:: Facility Resident, Self Patient language and need for interpreter reviewed:: Yes        Need for Family Participation in Patient Care: Yes (Comment) Care giver support system in place?: Yes (comment)   Criminal Activity/Legal Involvement Pertinent to Current Situation/Hospitalization: No - Comment as needed  Activities of Daily Living      Permission Sought/Granted Permission sought to share information with : Family Supports, Oceanographer granted to share information with : No (Contact information on chart, SNF)  Share Information  with NAME: [name]  Permission granted to share info w AGENCY: [facility]  Permission granted to share info w Relationship: Sister  Permission granted to share info w Contact Information: [phone]  Emotional Assessment   Attitude/Demeanor/Rapport: Unable to Assess Affect (typically observed): Unable to Assess Orientation: : Oriented to Self Alcohol / Substance Use: Not Applicable Psych Involvement: No (comment)  Admission diagnosis:  Acute respiratory failure, unspecified whether with hypoxia or hypercapnia (HCC) [J96.00] Altered mental status, unspecified altered mental status type [R41.82] AMS (altered mental status) [R41.82] Patient Active Problem List   Diagnosis Date Noted   AMS (altered mental status) 08/08/2023   NSTEMI (non-ST elevated myocardial infarction) (HCC) 07/11/2023   Acute cystitis 11/22/2021   Delirium due to multiple etiologies, acute, hyperactive 11/21/2021   Acute metabolic encephalopathy 11/21/2021   Hypothyroidism, unspecified 11/21/2021   Purulent bronchitis (HCC) 11/16/2021   Urinary retention 11/16/2021   DNR (do not resuscitate) 11/16/2021   Intracerebral hemorrhage 11/13/2021   Protein-calorie malnutrition, severe 11/13/2021   Status epilepticus (HCC)    Acute on chronic diastolic heart failure (HCC) 05/23/2020   COPD with acute exacerbation (HCC) 05/23/2020   Multiple closed fractures of ribs of left side    Fall    Palliative care by specialist    Goals of care, counseling/discussion    Advanced directives, counseling/discussion    Generalized weakness    Fracture of one rib, left side, initial encounter for closed fracture 04/08/2020   Fracture of greater trochanter of left femur (  HCC) 04/08/2020   Closed fracture of greater trochanter of left femur (HCC) 04/08/2020   Epistaxis 03/13/2020   Acute blood loss anemia 03/13/2020   Current smoker    Hypertension, uncontrolled    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)    Hemoptysis  09/14/2018   Squamous cell lung cancer, left (HCC) 09/10/2018   Adenocarcinoma, lung, 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   Multiple lung nodules 07/20/2018   Chronic obstructive pulmonary disease (HCC) 07/20/2018   Acute CHF (congestive heart failure) (HCC) 07/06/2018   Chronic respiratory failure with hypoxia (HCC) 07/06/2018   Hyponatremia 07/06/2018   Anemia 07/06/2018   Breast cancer of upper-outer quadrant of left female breast (HCC) 09/08/2015   Renal artery stenosis (HCC) 08/23/2014   Right upper extremity numbness 07/01/2013   Atherosclerotic renal artery stenosis, bilateral (HCC) 05/26/2013   Peripheral arterial disease (HCC) 05/26/2013   Essential hypertension 05/26/2013   Hyperlipidemia 05/26/2013   Tobacco abuse 05/26/2013   Carotid artery stenosis 03/09/2012   PCP:  Georgianne Fick, MD Pharmacy:   [facility] [phone] - [place], [place] - [address] AT [address] [facility] [address] [place] [address] Phone: [phone] Fax: [phone]     Social Drivers of Health (SDOH) Social History: SDOH Screenings   Food Insecurity: No Food Insecurity (07/12/2023)  Housing: Low Risk  (07/12/2023)  Transportation Needs: No Transportation Needs (07/12/2023)  Utilities: Not At Risk (07/12/2023)  Alcohol Screen: Low Risk  (06/20/2020)  Depression (PHQ2-9): Low Risk  (06/20/2020)  Financial Resource Strain: Low Risk  (06/20/2020)  Physical Activity: Insufficiently Active (06/20/2020)  Social Connections: Moderately Isolated (06/20/2020)  Stress: No Stress Concern Present (06/20/2020)  Tobacco Use: High Risk (08/08/2023)   SDOH Interventions:     Readmission Risk Interventions    07/14/2023   12:45 PM  Readmission Risk Prevention Plan  Transportation Screening Complete  PCP or Specialist Appt within 5-7 Days Complete  Home Care Screening Complete  Medication Review (RN CM) Complete

## 2023-08-10 NOTE — Progress Notes (Addendum)
NEUROLOGY CONSULT FOLLOW UP NOTE   Date of service: August 10, 2023 Patient Name: Kendra Watts MRN:  [id number] DOB:  1946-07-21  Interval Hx/subjective   No further staring spells Patient remains disoriented / mildly delirious No acute concerns per nursing; did receive 1 unit pRBC for anemia felt to be likely related to chronic disease  Code status updated to full code by family, Palliative care consult pending  Vitals   Vitals:   08/10/23 0600 08/10/23 0630 08/10/23 0657 08/10/23 0700  BP: (!) 152/48 (!) 139/49 (!) 145/56 (!) 149/57  Pulse: 77 76 74 75  Resp: (!) 29 (!) 24 (!) 24 (!) 24  Temp:  98.7 F (37.1 C) 98.5 F (36.9 C)   TempSrc:  Oral    SpO2: 99% 100% 100% 100%  Weight:      Height:         Body mass index is 17.01 kg/m.  Physical Exam   Constitutional: Appears well-developed and well-nourished.  Eyes: No scleral injection.  HENT: No OP obstrucion.  Head: Normocephalic.  Cardiovascular: SB/SR on monitor.  Respiratory: Effort normal, non-labored breathing. 2L [place].  Skin: WDI.   Neurologic Examination   Neuro: Mental Status: Patient is drowsy. Opens eyes to voice and follows simple commands.  She has poor attention/concentration, perseverates on month when asked year for example. Oriented to [facility], cannot provide any details of reason for hospitalization She responds to questions with intermittent delayed speech.  Object naming, repetition intact.  Cranial Nerves: II: Visual Fields are full. Pupils are equal, round, and reactive to light.   III,IV, VI: EOMI without ptosis or diploplia.  VII: Facial movement is symmetric.  VIII: hearing is intact to voice Motor: Tone is normal. Bulk is decreased diffusely.  No drift bilateral upper extremities, good antigravity strength bilateral LE  Sensory: Equally reactive to touch throughout Cerebellar: High five intact bilaterally  Medications  Current Facility-Administered Medications:     arformoterol (BROVANA) nebulizer solution 15 mcg, 15 mcg, Nebulization, BID, Lorin Glass, MD, 15 mcg at 08/09/23 2017   aspirin chewable tablet 81 mg, 81 mg, Oral, Daily, Lorin Glass, MD, 81 mg at 08/09/23 1222   budesonide (PULMICORT) nebulizer solution 0.5 mg, 0.5 mg, Nebulization, BID, Lorin Glass, MD, 0.5 mg at 08/09/23 2017   Chlorhexidine Gluconate Cloth 2 % PADS 6 each, 6 each, Topical, Daily, Lorin Glass, MD, 6 each at 08/09/23 1011   dexmedetomidine (PRECEDEX) 400 MCG/100ML (4 mcg/mL) infusion, 0-1.2 mcg/kg/hr, Intravenous, Titrated, [place], Olga Coaster, MD, Stopped at 08/09/23 1039   docusate sodium (COLACE) capsule 100 mg, 100 mg, Oral, BID PRN, Lorin Glass, MD   docusate sodium (COLACE) capsule 100 mg, 100 mg, Oral, BID, Lorin Glass, MD, 100 mg at 08/09/23 2124   feeding supplement (ENSURE ENLIVE / ENSURE PLUS) liquid 237 mL, 237 mL, Oral, BID BM, Lorin Glass, MD, 237 mL at 08/09/23 1427   heparin injection 5,000 Units, 5,000 Units, Subcutaneous, Q12H, Lorin Glass, MD, 5,000 Units at 08/09/23 2125   hydrALAZINE (APRESOLINE) injection 20 mg, 20 mg, Intravenous, Q6H PRN, Massie Maroon, MD, 20 mg at 08/09/23 0631   levETIRAcetam (KEPPRA) tablet 500 mg, 500 mg, Oral, BID, Luciano Cutter, MD, 500 mg at 08/09/23 2124   levothyroxine (SYNTHROID) tablet 88 mcg, 88 mcg, Oral, QAC breakfast, Lorin Glass, MD, 88 mcg at 08/10/23 2952   metoprolol succinate (TOPROL-XL) 24 hr tablet 50 mg, 50 mg, Oral, Daily, Lorin Glass, MD,  50 mg at 08/09/23 1106   multivitamin with minerals tablet 1 tablet, 1 tablet, Per Tube, Daily, Lorin Glass, MD, 1 tablet at 08/09/23 1106   norepinephrine (LEVOPHED) 4mg  in (0.016 mg/mL) premix infusion, 2-10 mcg/min, Intravenous, Titrated, Lorin Glass, MD   ondansetron [facility]) injection 4 mg, 4 mg, Intravenous, Q6H PRN, Lorin Glass, MD   Oral care mouth rinse, 15 mL, Mouth Rinse, PRN, Lorin Glass, MD    pantoprazole (PROTONIX) EC tablet 40 mg, 40 mg, Oral, QHS, Lorin Glass, MD, 40 mg at 08/09/23 2124   piperacillin-tazobactam (ZOSYN) IVPB 3.375 g, 3.375 g, Intravenous, Q8H, Lorin Glass, MD, Last Rate: 12.5 mL/hr at 08/10/23 0616, 3.375 g at 08/10/23 0616   polyethylene glycol (MIRALAX / GLYCOLAX) packet 17 g, 17 g, Oral, Daily PRN, Lorin Glass, MD   revefenacin (YUPELRI) nebulizer solution 175 mcg, 175 mcg, Nebulization, Daily, Lorin Glass, MD, 175 mcg at 08/09/23 2725   rosuvastatin (CRESTOR) tablet 10 mg, 10 mg, Oral, Daily, Lorin Glass, MD, 10 mg at 08/09/23 1107  Labs and Diagnostic Imaging    Basic Metabolic Panel: Recent Labs  Lab 08/08/23 0922 08/08/23 1117 08/09/23 0813 08/10/23 0220  NA 142 140 142 137  K 3.9 3.9 4.2 3.8  CL 90*  --  91* 86*  CO2 38*  --  39* 40*  GLUCOSE 164*  --  73 112*  BUN 13  --  16 22  CREATININE 0.84  --  1.14* 0.94  CALCIUM 8.7*  --  8.6* 8.6*  MG  --   --  1.9 2.6*  PHOS  --   --  3.5 4.2    CBC: Recent Labs  Lab 08/08/23 0922 08/08/23 1117 08/09/23 0813 08/10/23 0220  WBC 12.7*  --  16.7* 14.5*  HGB 7.5* 8.8* 7.2* 6.5*  HCT 26.1* 26.0* 24.0* 21.3*  MCV 94.6  --  90.2 89.9  PLT 284  --  274 285    Coagulation Studies: Recent Labs    08/08/23 0922  LABPROT 14.8  INR 1.1      Lipid Panel:  Lab Results  Component Value Date   LDLCALC 45 07/12/2023   HgbA1c:  Lab Results  Component Value Date   HGBA1C 5.6 11/12/2021   Urine Drug Screen:     Component Value Date/Time   LABOPIA NONE DETECTED 11/12/2021 1807   COCAINSCRNUR NONE DETECTED 11/12/2021 1807   LABBENZ POSITIVE (A) 11/12/2021 1807   AMPHETMU NONE DETECTED 11/12/2021 1807   THCU NONE DETECTED 11/12/2021 1807   LABBARB NONE DETECTED 11/12/2021 1807    Alcohol Level No results found for: "ETH" INR  Lab Results  Component Value Date   INR 1.1 08/08/2023   APTT  Lab Results  Component Value Date   APTT 28 07/11/2023    CT Head  without contrast(Personally reviewed): - No evidence of acute intracranial abnormality - Parenchymal atrophy and chronic small vessel ischemic disease  LTM EEG 08/08/23 2356 to 08/09/23 0520:  Suggestive of moderate diffuse encephalopathy. No seizures or epileptiform discharges were seen throughout the recording.   Assessment   Kendra Watts is a 78 y.o. female  with hx of prior L occipital lobe hemorrhagic infarcts, prior episodes concerning for absence seizures but none captured on LTM in the past and LTM in the past with no epileptiform discharges who presented to the ED inrespiratory distress 1/17 from her facility. She was found unresponsive for 15 mins. She was  bagged enroute and intubated in the ED and admitted to the ICU. She was extubated and was transferred to the floor. On the floor, she was noted to have episodes of starring off into space and would be mute during these events. She would withdraw to pain. She had multiple episodes and inbetween would be communicative.   Lactic acid was elevated on admission. This could be due to infection or due to seizure. Blood cultures negative preliminary. Resp panel negative. UA pending.   She was placed on LTM EEG 1/17 pm, this has been negative. Given prior left occipital lobe hemorrhage, she is at risk for seizures. She got a load of Valproic acid and her home Keppra was continued.   Breakthrough seizures secondary to hypercapnia versus possible infectious process  Recommendations   - continue home Keppra 500mg  BID - discontinue LTM if read is negative today for subclinical events - MRI Brain after EEG to rule out any acute process. If negative, neurology will sign off -- see report below, we will sign off, please reach out with any questions/concerns.   Addendum: MRI brain: 1. No acute or reversible finding. No evidence of metastatic disease. 2. Resolution of a previously seen hemorrhage or hemorrhagic infarction in the inferior left  occipital lobe. Presently, there is mild gliosis and hemosiderin deposition in this region. No evidence of mass effect or vasogenic edema. Second focus of hemosiderin deposition seen above that region in the left occipital lobe, also without edema or enhancement. 3. Chronic small-vessel ischemic changes of the pons and cerebral hemispheric white matter, similar to the prior exam. Few old small vessel cerebellar infarctions. 4. Mesial temporal lobes appear normal. ______________________________________________________________________  Brooke Dare MD-PhD [facility] [phone] Available 7 AM to 7 PM, outside these hours please contact Neurologist on call listed on AMION  Discussed with CCM Dr. Denese Killings via secure chat

## 2023-08-10 NOTE — NC FL2 (Signed)
[place] MEDICAID FL2 LEVEL OF CARE FORM     IDENTIFICATION  Patient Name: Kendra Watts Birthdate: 05-06-1946 Sex: female Admission Date (Current Location): 08/08/2023  [facility] and [place] Number:  Producer, television/film/video and Address:  [facility], [address] [place], [place] [address]      Provider Number: [id number]  Attending Physician Name and Address:  Lynnell Catalan, MD  Relative Name and Phone Number:  [name]; Sister; [phone]    Current Level of Care: Hospital Recommended Level of Care: Skilled Nursing Facility Prior Approval Number:    Date Approved/Denied:   PASRR Number: [id number]  Discharge Plan: SNF    Current Diagnoses: Patient Active Problem List   Diagnosis Date Noted   AMS (altered mental status) 08/08/2023   NSTEMI (non-ST elevated myocardial infarction) (HCC) 07/11/2023   Acute cystitis 11/22/2021   Delirium due to multiple etiologies, acute, hyperactive 11/21/2021   Acute metabolic encephalopathy 11/21/2021   Hypothyroidism, unspecified 11/21/2021   Purulent bronchitis (HCC) 11/16/2021   Urinary retention 11/16/2021   DNR (do not resuscitate) 11/16/2021   Intracerebral hemorrhage 11/13/2021   Protein-calorie malnutrition, severe 11/13/2021   Status epilepticus (HCC)    Acute on chronic diastolic heart failure (HCC) 05/23/2020   COPD with acute exacerbation (HCC) 05/23/2020   Multiple closed fractures of ribs of left side    Fall    Palliative care by specialist    Goals of care, counseling/discussion    Advanced directives, counseling/discussion    Generalized weakness    Fracture of one rib, left side, initial encounter for closed fracture 04/08/2020   Fracture of greater trochanter of left femur (HCC) 04/08/2020   Closed fracture of greater trochanter of left femur (HCC) 04/08/2020   Epistaxis 03/13/2020   Acute blood loss anemia 03/13/2020   Current smoker    Hypertension, uncontrolled     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)    Hemoptysis 09/14/2018   Squamous cell lung cancer, left (HCC) 09/10/2018   Adenocarcinoma, lung, 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   Multiple lung nodules 07/20/2018   Chronic obstructive pulmonary disease (HCC) 07/20/2018   Acute CHF (congestive heart failure) (HCC) 07/06/2018   Chronic respiratory failure with hypoxia (HCC) 07/06/2018   Hyponatremia 07/06/2018   Anemia 07/06/2018   Breast cancer of upper-outer quadrant of left female breast (HCC) 09/08/2015   Renal artery stenosis (HCC) 08/23/2014   Right upper extremity numbness 07/01/2013   Atherosclerotic renal artery stenosis, bilateral (HCC) 05/26/2013   Peripheral arterial disease (HCC) 05/26/2013   Essential hypertension 05/26/2013   Hyperlipidemia 05/26/2013   Tobacco abuse 05/26/2013   Carotid artery stenosis 03/09/2012    Orientation RESPIRATION BLADDER Height & Weight     Self  O2 (4L nasal cannula) Continent, External catheter Weight: 87 lb 1.3 oz (39.5 kg) Height:  5' (152.4 cm)  BEHAVIORAL SYMPTOMS/MOOD NEUROLOGICAL BOWEL NUTRITION STATUS      Incontinent Diet (Please see dc summary)  AMBULATORY STATUS COMMUNICATION OF NEEDS Skin   Extensive Assist Verbally Normal                       Personal Care Assistance Level of Assistance  Bathing, Feeding, Dressing Bathing Assistance: Maximum assistance Feeding assistance: Maximum assistance Dressing Assistance: Maximum assistance     Functional Limitations Info  Sight, Hearing Sight Info: Impaired (R and L) Hearing Info: Impaired (R and L)      SPECIAL CARE FACTORS FREQUENCY  PT (By licensed PT),  OT (By licensed OT)     PT Frequency: 5x OT Frequency: 5x            Contractures Contractures Info: Not present    Additional Factors Info  Code Status, Allergies Code Status Info: Full Code Allergies Info: NKA           Current Medications (08/10/2023):  This is the current hospital active medication  list Current Facility-Administered Medications  Medication Dose Route Frequency Provider Last Rate Last Admin   arformoterol (BROVANA) nebulizer solution 15 mcg  15 mcg Nebulization BID Lorin Glass, MD   15 mcg at 08/10/23 0745   aspirin chewable tablet 81 mg  81 mg Oral Daily Lorin Glass, MD   81 mg at 08/10/23 4696   budesonide (PULMICORT) nebulizer solution 0.5 mg  0.5 mg Nebulization BID Lorin Glass, MD   0.5 mg at 08/10/23 0745   Chlorhexidine Gluconate Cloth 2 % PADS 6 each  6 each Topical Daily Lorin Glass, MD   6 each at 08/10/23 1000   docusate sodium (COLACE) capsule 100 mg  100 mg Oral BID PRN Lorin Glass, MD       docusate sodium (COLACE) capsule 100 mg  100 mg Oral BID Lorin Glass, MD   100 mg at 08/09/23 2124   feeding supplement (ENSURE ENLIVE / ENSURE PLUS) liquid 237 mL  237 mL Oral BID BM Lorin Glass, MD   237 mL at 08/10/23 0924   heparin injection 5,000 Units  5,000 Units Subcutaneous Q12H Lorin Glass, MD   5,000 Units at 08/09/23 2125   hydrALAZINE (APRESOLINE) injection 20 mg  20 mg Intravenous Q6H PRN Massie Maroon, MD   20 mg at 08/09/23 0631   levETIRAcetam (KEPPRA) tablet 500 mg  500 mg Oral BID Luciano Cutter, MD   500 mg at 08/10/23 2952   levothyroxine (SYNTHROID) tablet 88 mcg  88 mcg Oral QAC breakfast Lorin Glass, MD   88 mcg at 08/10/23 8413   metoprolol succinate (TOPROL-XL) 24 hr tablet 50 mg  50 mg Oral Daily Lorin Glass, MD   50 mg at 08/10/23 0923   [START ON 08/11/2023] multivitamin with minerals tablet 1 tablet  1 tablet Oral Daily Agarwala, Daleen Bo, MD       ondansetron (ZOFRAN) injection 4 mg  4 mg Intravenous Q6H PRN Lorin Glass, MD       Oral care mouth rinse  15 mL Mouth Rinse PRN Lorin Glass, MD       pantoprazole (PROTONIX) EC tablet 40 mg  40 mg Oral QHS Lorin Glass, MD   40 mg at 08/09/23 2124   piperacillin-tazobactam (ZOSYN) IVPB 3.375 g  3.375 g Intravenous Q8H Lorin Glass, MD 12.5 mL/hr  at 08/10/23 0616 3.375 g at 08/10/23 0616   polyethylene glycol (MIRALAX / GLYCOLAX) packet 17 g  17 g Oral Daily PRN Lorin Glass, MD       revefenacin [facility]) nebulizer solution 175 mcg  175 mcg Nebulization Daily Lorin Glass, MD   175 mcg at 08/10/23 0745   rosuvastatin (CRESTOR) tablet 10 mg  10 mg Oral Daily Lorin Glass, MD   10 mg at 08/10/23 2440     Discharge Medications: Please see discharge summary for a list of discharge medications.  Relevant Imaging Results:  Relevant Lab Results:   Additional Information SSN [id number]  Marliss Coots, LCSW

## 2023-08-10 NOTE — Progress Notes (Signed)
Daily Progress Note   Patient Name: Kendra Watts       Date: 08/10/2023 DOB: January 10, 1946  Age: 78 y.o. MRN#: [id number] Attending Physician: Lynnell Catalan, MD Primary Care Physician: Georgianne Fick, MD Admit Date: 08/08/2023  Reason for Consultation/Follow-up: Establishing goals of care   Length of Stay: 2  Current Medications: Scheduled Meds:   arformoterol  15 mcg Nebulization BID   aspirin  81 mg Oral Daily   budesonide (PULMICORT) nebulizer solution  0.5 mg Nebulization BID   Chlorhexidine Gluconate Cloth  6 each Topical Daily   docusate sodium  100 mg Oral BID   feeding supplement  237 mL Oral BID BM   heparin injection (subcutaneous)  5,000 Units Subcutaneous Q12H   levETIRAcetam  500 mg Oral BID   levothyroxine  88 mcg Oral QAC breakfast   metoprolol succinate  50 mg Oral Daily   multivitamin with minerals  1 tablet Per Tube Daily   pantoprazole  40 mg Oral QHS   revefenacin  175 mcg Nebulization Daily   rosuvastatin  10 mg Oral Daily    Continuous Infusions:  dexmedetomidine (PRECEDEX) IV infusion Stopped (08/09/23 1039)   norepinephrine (LEVOPHED) Adult infusion     piperacillin-tazobactam (ZOSYN)  IV 3.375 g (08/10/23 0616)    PRN Meds: docusate sodium, hydrALAZINE, ondansetron (ZOFRAN) IV, mouth rinse, polyethylene glycol  Physical Exam Vitals reviewed.  Constitutional:      General: She is not in acute distress.    Appearance: She is ill-appearing.  HENT:     Head: Normocephalic and atraumatic.  Cardiovascular:     Rate and Rhythm: Normal rate.  Pulmonary:     Effort: Pulmonary effort is normal.  Skin:    General: Skin is warm and dry.  Neurological:     Mental Status: She is alert. She is disoriented.             Vital Signs: BP (!) 156/56  (BP Location: Right Arm)   Pulse 75   Temp 98.5 F (36.9 C) (Axillary)   Resp 17   Ht 5' (1.524 m)   Wt 39.5 kg   SpO2 100%   BMI 17.01 kg/m  SpO2: SpO2: 100 % O2 Device: O2 Device: Nasal Cannula O2 Flow Rate: O2 Flow Rate (L/min): 4 L/min      Patient  Active Problem List   Diagnosis Date Noted   AMS (altered mental status) 08/08/2023   NSTEMI (non-ST elevated myocardial infarction) (HCC) 07/11/2023   Acute cystitis 11/22/2021   Delirium due to multiple etiologies, acute, hyperactive 11/21/2021   Acute metabolic encephalopathy 11/21/2021   Hypothyroidism, unspecified 11/21/2021   Purulent bronchitis (HCC) 11/16/2021   Urinary retention 11/16/2021   DNR (do not resuscitate) 11/16/2021   Intracerebral hemorrhage 11/13/2021   Protein-calorie malnutrition, severe 11/13/2021   Status epilepticus (HCC)    Acute on chronic diastolic heart failure (HCC) 05/23/2020   COPD with acute exacerbation (HCC) 05/23/2020   Multiple closed fractures of ribs of left side    Fall    Palliative care by specialist    Goals of care, counseling/discussion    Advanced directives, counseling/discussion    Generalized weakness    Fracture of one rib, left side, initial encounter for closed fracture 04/08/2020   Fracture of greater trochanter of left femur (HCC) 04/08/2020   Closed fracture of greater trochanter of left femur (HCC) 04/08/2020   Epistaxis 03/13/2020   Acute blood loss anemia 03/13/2020   Current smoker    Hypertension, uncontrolled    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)    Hemoptysis 09/14/2018   Squamous cell lung cancer, left (HCC) 09/10/2018   Adenocarcinoma, lung, 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   Multiple lung nodules 07/20/2018   Chronic obstructive pulmonary disease (HCC) 07/20/2018   Acute CHF (congestive heart failure) (HCC) 07/06/2018   Chronic respiratory failure with hypoxia (HCC) 07/06/2018   Hyponatremia 07/06/2018   Anemia 07/06/2018   Breast cancer of  upper-outer quadrant of left female breast (HCC) 09/08/2015   Renal artery stenosis (HCC) 08/23/2014   Right upper extremity numbness 07/01/2013   Atherosclerotic renal artery stenosis, bilateral (HCC) 05/26/2013   Peripheral arterial disease (HCC) 05/26/2013   Essential hypertension 05/26/2013   Hyperlipidemia 05/26/2013   Tobacco abuse 05/26/2013   Carotid artery stenosis 03/09/2012    Palliative Care Assessment & Plan   Patient Profile: 78 y.o. 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 Pt bagged enroute and intubated in ED and extubated same day. Concern noted for possible absence seizures. Rapid response called for episode of unresponsiveness 08/07/22.   Possible recurrent lung cancer (08/06/2022).  Today's Discussion: Patient is sitting up eating breakfast. She is oriented to person and place. She shares that she is in the hospital "to have breakfast" with me. I tell her that I spoke to [name] on the phone yesterday.   1100: Spoke with patient's sister [name]. We discuss the patient should be transferring out of the ICU today. [name] is hopeful she can visit the patient next weekend. She shares that she spoke with patient's nephew Rocky Link after our conversation yesterday. Rocky Link shared with her that the patient had changed her views on resuscitation status since 2023 and would want a resuscitation. I encouraged [name] to consider what decision she thinks the patient would make today with the understanding of her declining function, chronic conditions, and probable recurrent lung cancer. [name] shares that she is worried the patient may never be herself again. I share that I am also worried about this. Encourage her to take things one day  at a time. I offered to call Rocky Link to discuss  goals of care-- [name] is agreeable to this.  1140: Spoke with patient's nephew Rocky Link. He shares that when he was previously the patient's HCPOA she was clear that she would not want resuscitation or intubation. He shares that he was shocked to hear she had changed her code status to FULL code recently. She shares that he believes she could have changed it out of fear while being treated in the ED. He shares his concern that the patient will likely not be able to live independently again--- I agree. We discuss her probable recurrent lung cancer, poor functional status, and comorbidities. He shares that he cannot imagine she would be able to tolerate cancer therapies and I agreed. We discussed whether it makes sense to put her through continued testing such as the PET scan if it would not change her trajectory. I encourage Rocky Link to speak with [name] about his concerns. He is also agreeable to a goals of care meeting with [name]--- will facilitate this.   Recommendations/Plan: Full code: sister changed code status this am Full scope: Continue current scope of care Time for outcomes- to see if she can make GOC decisions Continued PMT support: set up meeting with nephew and sister   Code Status:    Code Status Orders  (From admission, onward)           Start     Ordered   08/10/23 0631  Full code  (Code Status)  Continuous       Question:  By:  Answer:  Other   08/10/23 0630           Extensive chart review has been completed prior to seeing the patient including labs, vital signs, imaging, progress/consult notes, orders, medications, and available advance directive documents.  Care plan was discussed with bedside RN and Dr. Denese Killings  Time spent: 65 minutes  Thank you for allowing the Palliative Medicine Team to assist in the care of this patient.   Sherryll Burger, NP  Please contact Palliative Medicine Team phone at [phone] for questions and  concerns.

## 2023-08-10 NOTE — Plan of Care (Signed)

## 2023-08-10 NOTE — Progress Notes (Signed)
eLink Physician-Brief Progress Note Patient Name: Kendra Watts DOB: 05-16-1946 MRN: [id number]   Date of Service  08/10/2023  HPI/Events of Note  anemia  eICU Interventions      Hgb 6.5 Prob anemia of Chronic dis  Will transfuse one U PRBCs    [name] 08/10/2023, 3:04 AM

## 2023-08-10 NOTE — Progress Notes (Signed)
vLTM maintenance  All impedances below 10K. No skin breakdown noted at  FP1  FP2 A2  T7

## 2023-08-10 NOTE — Progress Notes (Addendum)
NAME:  Kendra Watts, MRN:  [id number], DOB:  Aug 30, 1945, LOS: 2 ADMISSION DATE:  08/08/2023, CONSULTATION DATE:  1/17 REFERRING MD:  ED, CHIEF COMPLAINT:  AMS   History of Present Illness:  78 year old woman with hx of O2-dependent COPD, bilateral lung cancer with prior SBRT, FTT, severe protein calorie malnutrition, prior seizures presenting after being found obtunded at SNF. Intubated in ER and PCCM consulted.   Pertinent  Medical History  Anemia Breast cancer CHF COPD  HTN HLD  Significant Hospital Events: Including procedures, antibiotic start and stop dates in addition to other pertinent events   1/17 admit; intubated and extubated; transferred out of ICU and back to ICU due to unresponsiveness/hypoxemia  Interim History / Subjective:  No events overnight. Awake, oriented to self. Seen eating breakfast at bedside. Not acute concerns.   Objective   Blood pressure (!) 139/49, pulse 76, temperature 98.7 F (37.1 C), temperature source Oral, resp. rate (!) 24, height 5' (1.524 m), weight 39.5 kg, SpO2 100%.        Intake/Output Summary (Last 24 hours) at 08/10/2023 0656 Last data filed at 08/10/2023 0615 Gross per 24 hour  Intake 171.59 ml  Output 1050 ml  Net -878.41 ml   Filed Weights   08/08/23 0938 08/08/23 1059 08/10/23 0500  Weight: 40 kg 40 kg 39.5 kg    Examination: General: chronically ill, frail elderly female  Lungs: clear, non labored breathing on [place] Cardiovascular: regular rate, rhythm Abdomen: soft, nontender Extremities: muscle wasting Neuro: oriented to self, but not to place or time  WBC 14.5 Hb 6.5 Cr 0.94  Resolved Hospital Problem list   Hypotension   Assessment & Plan:   AMS of unclear etiology Hx of seizures - EEG read pending, will discontinue as patient is awake and showing no signs of seizure activity - off precedex - keppra 500 mg bid - MRI brain after EEG per neuro - PT/OT  Acute on chronic hypoxemic and hypercarbic  respiratory failure COPD Hx of pulmonary cachexia Possible aspiration pneumonia - BiPAP prn/at bedtime  - on [place], wean O2 as able - continue course of zosyn  - Brovana, Pulmicort, Yupelri   Hypotension, resolved - not on pressors  - hypertensive now   Acute on chronic anemia - no s/s of bleeding - Hb 6.5, 1u prbc ordered  Hypothyroidism - synthroid 88 mcg daily   Stable for transfer out of the ICU.   Best Practice (right click and "Reselect all SmartList Selections" daily)   Diet/type: dysphagia diet (see orders) DVT prophylaxis prophylactic heparin  Pressure ulcer(s): N/A GI prophylaxis: PPI Lines: N/A Foley:  N/A Code Status:  full code Last date of multidisciplinary goals of care discussion [no family at bedside, pending]  Labs   CBC: Recent Labs  Lab 08/08/23 0922 08/08/23 1117 08/09/23 0813 08/10/23 0220  WBC 12.7*  --  16.7* 14.5*  HGB 7.5* 8.8* 7.2* 6.5*  HCT 26.1* 26.0* 24.0* 21.3*  MCV 94.6  --  90.2 89.9  PLT 284  --  274 285    Basic Metabolic Panel: Recent Labs  Lab 08/08/23 0922 08/08/23 1117 08/09/23 0813 08/10/23 0220  NA 142 140 142 137  K 3.9 3.9 4.2 3.8  CL 90*  --  91* 86*  CO2 38*  --  39* 40*  GLUCOSE 164*  --  73 112*  BUN 13  --  16 22  CREATININE 0.84  --  1.14* 0.94  CALCIUM 8.7*  --  8.6* 8.6*  MG  --   --  1.9 2.6*  PHOS  --   --  3.5 4.2   GFR: Estimated Creatinine Clearance: 31.3 mL/min (by C-G formula based on SCr of 0.94 mg/dL). Recent Labs  Lab 08/08/23 0922 08/08/23 1020 08/08/23 1210 08/09/23 0813 08/10/23 0220  WBC 12.7*  --   --  16.7* 14.5*  LATICACIDVEN  --  3.8* 2.2*  --   --     Liver Function Tests: Recent Labs  Lab 08/08/23 0922  AST 36  ALT 39  ALKPHOS 69  BILITOT 0.3  PROT 5.5*  ALBUMIN 2.4*   No results for input(s): "LIPASE", "AMYLASE" in the last 168 hours. No results for input(s): "AMMONIA" in the last 168 hours.  ABG    Component Value Date/Time   PHART 7.335 (L) 08/08/2023  1117   PCO2ART 88.5 (HH) 08/08/2023 1117   PO2ART 489 (H) 08/08/2023 1117   HCO3 47.5 (H) 08/08/2023 1117   TCO2 >50 (H) 08/08/2023 1117   O2SAT 100 08/08/2023 1117     Coagulation Profile: Recent Labs  Lab 08/08/23 0922  INR 1.1    Cardiac Enzymes: Recent Labs  Lab 08/08/23 1022  CKTOTAL 30*    HbA1C: Hgb A1c MFr Bld  Date/Time Value Ref Range Status  11/12/2021 04:10 PM 5.6 4.8 - 5.6 % Final    Comment:    (NOTE) Pre diabetes:          5.7%-6.4%  Diabetes:              >6.4%  Glycemic control for   <7.0% adults with diabetes     CBG: Recent Labs  Lab 08/08/23 1102 08/08/23 1521 08/08/23 2023 08/09/23 1131  GLUCAP 92 94 96 126*    Review of Systems:   Unable to obtain   Past Medical History:  She,  has a past medical history of Anemia, Aortic arch anomaly, Breast cancer (HCC) (09/22/2015), Breast cancer of upper-outer quadrant of left female breast (HCC) (09/08/2015), Cataract, immature, CHF (congestive heart failure) (HCC), COPD (chronic obstructive pulmonary disease) (HCC), Encephalopathy acute (11/12/2021), Femur fracture, left (HCC) (04/11/2020), Heart murmur, History of cervical spine x-ray (11/13/2021), History of hyperthyroidism, History of radiation therapy, History of radiation therapy (11/28/2015), Hyperlipidemia, Hypertension, Hypokalemia, Hypothyroidism, Nonfunctioning kidney, Personal history of radiation therapy, Pulmonary nodules, Radiation (10/30/15-11/28/15), Renal artery stenosis (HCC), Tobacco abuse, and Wears partial dentures.   Surgical History:   Past Surgical History:  Procedure Laterality Date   ABDOMINAL ANGIOGRAM  02/18/2012   Procedure: ABDOMINAL ANGIOGRAM;  Surgeon: Runell Gess, MD;  Location: [facility] CATH LAB;  Service: Cardiovascular;;   ABDOMINAL AORTAGRAM  07/04/2014   ABDOMINAL HYSTERECTOMY  ~ 1977   partial   APPENDECTOMY     ARCH AORTOGRAM     BREAST LUMPECTOMY Left 09/22/2015   Malignant   CAROTID ANGIOGRAM N/A 02/18/2012    Procedure: CAROTID ANGIOGRAM;  Surgeon: Runell Gess, MD;  Location: [facility] CATH LAB;  Service: Cardiovascular;  Laterality: N/A;   ENDARTERECTOMY  04/02/2012   Procedure: ENDARTERECTOMY CAROTID;  Surgeon: Nada Libman, MD;  Location: [facility] OR;  Service: Vascular;  Laterality: Right;   FUDUCIAL PLACEMENT Bilateral 09/02/2018   Procedure: Placement Of Fiducial to right upper lobe & left upper lobe lung;  Surgeon: Leslye Peer, MD;  Location: [facility] OR;  Service: Thoracic;  Laterality: Bilateral;   IR THORACENTESIS ASP PLEURAL SPACE W/IMG GUIDE  07/08/2018   RADIOACTIVE SEED GUIDED PARTIAL MASTECTOMY WITH AXILLARY SENTINEL LYMPH  NODE BIOPSY Left 09/22/2015   Procedure: INJECT BLUE DYE LEFT BREAST,RADIOACTIVE SEED GUIDED PARTIAL MASTECTOMY WITH AXILLARY SENTINEL LYMPH NODE BIOPSY;  Surgeon: Claud Kelp, MD;  Location: [facility];  Service: General;  Laterality: Left;   RENAL ANGIOGRAM Left 06/08/2010   renal artery stent -  5x12 Genesis on Aviator balloon stent (Dr. Erlene Quan)   RENAL ANGIOGRAM Right 07/04/2014   Procedure: RENAL ANGIOGRAM;  Surgeon: Runell Gess, MD;  Location: [facility] CATH LAB;  Service: Cardiovascular;  Laterality: Right;   RENAL ANGIOGRAM Right 08/22/2014   Procedure: RENAL ANGIOGRAM;  Surgeon: Runell Gess, MD;  Location: [facility] CATH LAB;  Service: Cardiovascular;  Laterality: Right;   TONSILLECTOMY     as a child   VIDEO BRONCHOSCOPY WITH ENDOBRONCHIAL NAVIGATION N/A 09/02/2018   Procedure: VIDEO BRONCHOSCOPY WITH ENDOBRONCHIAL NAVIGATION;  Surgeon: Leslye Peer, MD;  Location: [facility] OR;  Service: Thoracic;  Laterality: N/A;     Social History:   reports that she has been smoking cigarettes. She has a 20 pack-year smoking history. She has never used smokeless tobacco. She reports current alcohol use of about 4.0 standard drinks of alcohol per week. She reports that she does not use drugs.   Family History:  Her family history includes Breast cancer in her  paternal aunt; Cancer in her father; Heart disease in her maternal grandmother and mother; Lung cancer in her father and sister. There is no history of Stroke.   Allergies No Known Allergies   Home Medications  Prior to Admission medications   Medication Sig Start Date End Date Taking? Authorizing Provider  aspirin 81 MG tablet Take 1 tablet (81 mg total) by mouth daily. 03/21/20  Yes Hongalgi, Maximino Greenland, MD  Budeson-Glycopyrrol-Formoterol (BREZTRI AEROSPHERE) 160-9-4.8 MCG/ACT AERO Inhale 2 puffs into the lungs in the morning and at bedtime. 06/12/21  Yes Leslye Peer, MD  doxazosin (CARDURA) 8 MG tablet Take 8 mg by mouth daily.   Yes [provider]  furosemide (LASIX) 20 MG tablet Take 20 mg by mouth daily.   Yes [provider]  levETIRAcetam (KEPPRA) 500 MG tablet Take 1 tablet (500 mg total) by mouth 2 (two) times daily. 05/20/23  Yes Lomax, Amy, NP  levothyroxine (SYNTHROID) 88 MCG tablet Take 88 mcg by mouth daily before breakfast.   Yes [provider]  metoprolol succinate (TOPROL-XL) 50 MG 24 hr tablet Take 1 tablet (50 mg total) by mouth daily. Take with or immediately following a meal. 07/18/23  Yes Pokhrel, Laxman, MD  pantoprazole (PROTONIX) 40 MG tablet Take 1 tablet (40 mg total) by mouth daily. 07/14/18  Yes Dow Adolph N, DO  rosuvastatin (CRESTOR) 10 MG tablet Take 10 mg by mouth daily.   Yes [provider]  OXYGEN Inhale 2-4 L into the lungs as needed. As needed to maintain SATS > 90%    [provider]     Critical care time: 30 min

## 2023-08-10 NOTE — Progress Notes (Signed)
vLTM discintinued  [facility] notified  No skin breakdown noted at all skin sites

## 2023-08-10 NOTE — Procedures (Addendum)
Patient Name: Kendra Watts  MRN: [id number]  Epilepsy Attending: Charlsie Quest  Referring Physician/Provider: Erick Blinks, MD  Duration: 08/09/2023 2356 to 08/10/2023 1155   Patient history: 78yo F with ams getting eeg to evaluate for seizure   Level of alertness: Awake/lethargic, asleep   AEDs during EEG study: LEV   Technical aspects: This EEG study was done with scalp electrodes positioned according to the 10-20 International system of electrode placement. Electrical activity was reviewed with band pass filter of 1-70Hz , sensitivity of 7 uV/mm, display speed of 15mm/sec with a 60Hz  notched filter applied as appropriate. EEG data were recorded continuously and digitally stored.  Video monitoring was available and reviewed as appropriate.   Description:  No clear posterior dominant rhythm was seen. Sleep was characterized by sleep spindles (12 to 14 Hz), maximal frontocentral region. EEG showed continuous generalized predominantly 5 to 7 Hz theta slowing admixed with intermittent generalized 2-3Hz  delta slowing. Hyperventilation and photic stimulation were not performed.      ABNORMALITY - Continuous slow, generalized   IMPRESSION: This study is suggestive of moderate diffuse encephalopathy. No seizures or epileptiform discharges were seen throughout the recording.   [name]

## 2023-08-10 NOTE — Progress Notes (Addendum)
eLink Physician-Brief Progress Note Patient Name: Kendra Watts DOB: 09-18-1945 MRN: [id number]   Date of Service  08/10/2023  HPI/Events of Note  Code status  eICU Interventions  Discussion with sister   Pt lacks capacity Sister Kendra Watts spoke with bedside RN Expressed the wishes to Change code status From DNR to full code  I called Kendra Watts and discussed with her the poor prognosis if Kendra Watts goes on mech vent or needed CPR  She understood and wants to proceed with full code    Massie Maroon 08/10/2023, 6:24 AM

## 2023-08-11 DIAGNOSIS — Z66 Do not resuscitate: Secondary | ICD-10-CM

## 2023-08-11 DIAGNOSIS — R531 Weakness: Secondary | ICD-10-CM

## 2023-08-11 DIAGNOSIS — Z515 Encounter for palliative care: Secondary | ICD-10-CM | POA: Diagnosis not present

## 2023-08-11 DIAGNOSIS — R410 Disorientation, unspecified: Secondary | ICD-10-CM

## 2023-08-11 DIAGNOSIS — J9600 Acute respiratory failure, unspecified whether with hypoxia or hypercapnia: Secondary | ICD-10-CM | POA: Diagnosis not present

## 2023-08-11 LAB — CBC
HCT: 28 % — ABNORMAL LOW (ref 36.0–46.0)
Hemoglobin: 8.4 g/dL — ABNORMAL LOW (ref 12.0–15.0)
MCH: 27.1 pg (ref 26.0–34.0)
MCHC: 30 g/dL (ref 30.0–36.0)
MCV: 90.3 fL (ref 80.0–100.0)
Platelets: 267 10*3/uL (ref 150–400)
RBC: 3.1 MIL/uL — ABNORMAL LOW (ref 3.87–5.11)
RDW: 17.4 % — ABNORMAL HIGH (ref 11.5–15.5)
WBC: 10.2 10*3/uL (ref 4.0–10.5)
nRBC: 0 % (ref 0.0–0.2)

## 2023-08-11 LAB — BPAM RBC
Blood Product Expiration Date: 202501232359
ISSUE DATE / TIME: 202501190634
Unit Type and Rh: 9500

## 2023-08-11 LAB — TYPE AND SCREEN
ABO/RH(D): O NEG
Antibody Screen: NEGATIVE
Unit division: 0

## 2023-08-11 LAB — BASIC METABOLIC PANEL
Anion gap: 8 (ref 5–15)
BUN: 14 mg/dL (ref 8–23)
CO2: 43 mmol/L — ABNORMAL HIGH (ref 22–32)
Calcium: 8.6 mg/dL — ABNORMAL LOW (ref 8.9–10.3)
Chloride: 91 mmol/L — ABNORMAL LOW (ref 98–111)
Creatinine, Ser: 0.89 mg/dL (ref 0.44–1.00)
GFR, Estimated: >60 mL/min (ref >60)
Glucose, Bld: 102 mg/dL — ABNORMAL HIGH (ref 70–99)
Potassium: 3.6 mmol/L (ref 3.5–5.1)
Sodium: 142 mmol/L (ref 135–145)

## 2023-08-11 LAB — PHOSPHORUS: Phosphorus: 3.3 mg/dL (ref 2.5–4.6)

## 2023-08-11 LAB — MAGNESIUM: Magnesium: 2.1 mg/dL (ref 1.7–2.4)

## 2023-08-11 MED ORDER — MELATONIN 3 MG PO TABS
3.0000 mg | ORAL_TABLET | Freq: Once | ORAL | Status: AC
Start: 2023-08-11 — End: 2023-08-11
  Administered 2023-08-11: 3 mg via ORAL
  Filled 2023-08-11: qty 1

## 2023-08-11 MED ORDER — HALOPERIDOL LACTATE 5 MG/ML IJ SOLN: 5.0000 mg | Freq: Four times a day (QID) | INTRAMUSCULAR | Status: DC | PRN

## 2023-08-11 MED ORDER — HALOPERIDOL LACTATE 5 MG/ML IJ SOLN
5.0000 mg | Freq: Once | INTRAMUSCULAR | Status: AC
  Administered 2023-08-11: 5 mg via INTRAMUSCULAR
  Filled 2023-08-11: qty 1

## 2023-08-11 NOTE — Progress Notes (Signed)
Patient ID: Kendra Watts, female   DOB: 1945/10/16, 78 y.o.   MRN: [id number]    Progress Note from the Palliative Medicine Team at [facility]   Patient Name: Kendra Watts        Date: 08/11/2023 DOB: [birth year]  Age: 78 y.o. MRN#: [id number] Attending Physician: Kendra Ide, MD Primary Care Physician: Kendra Fick, MD Admit Date: 08/08/2023   Reason for Consultation/Follow-up   Establishing Goals of Care   HPI/ Brief Hospital Review  78 y.o. 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 Pt bagged enroute and intubated in ED and extubated same day.  Rapid response called for episode of unresponsiveness 08/07/76.  Hospitalization complicated by agitation and confusion.  Patient without medical decision making capacity.  Family face treatment option decisions, advanced directive decisions and anticipatory care needs.   Subjective  Extensive chart review has been completed prior to meeting with patient/family  including labs, vital signs, imaging, progress/consult notes, orders, medications and available advance directive documents.    This NP assessed patient at the bedside as a follow up for palliative medicine needs and emotional support.  Initial palliative consult 08/09/2023  Patient remains confused, at this time there is no agitation.  She continues to have mitts.  Patient does not have medical decision-making capacity at this time.  Spoke with sister/HPOA/ Kendra Watts by phone for ongoing conversation regarding current medical situation.  Education offered on patient's multiple comorbidities and overall failure to thrive. Education offered on the limitations of medical interventions to prolong quality  of life when the body fails to thrive. We explored patient's multiple serious comorbidities specific to COPD, CHF, cancer history, CVA and acute respiratory failure resulting in this hospitalization. She remains high risk for decompensation   Plan of Care: -DNR/DNI-documented today --patient's sister is comfortable with decision, "she would not want to be put back on machines", she also tells me that patient's nephew supports DNR/DNI -At this time family request to continue to treat the treatable and hope for improvement. -Kendra Watts tells me that the first day that she can get to the hospital this week is Friday.  She is open to continue conversation with me regarding goals of care.  I expressed my concern for patient's overall long-term poor prognosis.         I reviewed with family patient's desire for a natural death document seen in EMR dated 05-17-2013.  Corrie Dandy tells me she is aware of the  document but that there is a more recent advanced directive that she will bring in for scanning. -Time for outcomes, and decision dependent on patient outcomes -PMT will continue to support holistically  Education offered today regarding  the importance of continued conversation with family and the  medical providers regarding overall plan of care and treatment options,  ensuring decisions are within the context of the patients values and GOCs.  Questions and concerns addressed   Discussed with primary team and nursing staff   Time: 50  minutes  Detailed review of medical records ( labs, imaging, vital signs), medically appropriate exam ( MS, skin, cardiac,  resp)   discussed with treatment team, counseling and education to patient, family, staff, documenting clinical information, medication management, coordination of care    [facility]  [facility] NP  Palliative Medicine Team Team Phone # [phone] Pager [phone]

## 2023-08-11 NOTE — Progress Notes (Signed)
eLink Physician-Brief Progress Note Patient Name: Kendra Watts DOB: [birth year] MRN: [id number]   Date of Service  08/11/2023  HPI/Events of Note  Not a camera room   Very agitated, pulling out IVs. Took off oygen and turned blue (recovered) Bedside is wanting something to relax her. Maybe some PRNs? Also, wonder is she needs wrist restraints.   Discussed with RN. Oof of BiPAP. Out of ICU, critical care on call until day shift. Alert and O x 3.  EEG neg for sz. Lung cancer. Anemia- Hg at 6.5, no active bleeding. S/p PRBC. Am f/u Hg   eICU Interventions  Melatonin 3 mg oral once Restraints ordered. Call if not better     Intervention Category Minor Interventions: Agitation / anxiety - evaluation and management  Ranee Gosselin 08/11/2023, 1:21 AM

## 2023-08-11 NOTE — Plan of Care (Signed)

## 2023-08-11 NOTE — Care Management Important Message (Signed)
Important Message  Patient Details  Name: Kendra Watts MRN: [id number] Date of Birth: [birth year]   Important Message Given:  Yes - Medicare IM     Dorena Bodo 08/11/2023, 3:32 PM

## 2023-08-11 NOTE — Progress Notes (Addendum)
[facility]  PROGRESS NOTE  Kendra Watts WUX:[id number] DOB: 06-20-1946 DOA: 08/08/2023 PCP: Kendra Fick, MD   Brief HPI:   78 year old female, O2 dependent COPD, bilateral lung cancer with prior SBRT, failure to thrive, severe protein calorie malnutrition, prior seizures presented after being found obtunded at skilled nursing facility.  Patient was intubated in the ED and was admitted in ICU.  She was extubated and transferred to the medical floor.  On the floor she was noted to have episodes of staring into the space along with getting mute.  Neurology was consulted. She was placed on LTM EEG on 1/17, which has been negative.  Given prior left occipital lobe hemorrhage she is at risk for seizures.  She was given load of valproic acid and started on Keppra.   Assessment/Plan:   Acute metabolic encephalopathy/history of seizures - neurology was consulted -Felt that she was having breakthrough seizures due to hypercapnia -LTM was discontinued as it was negative for subclinical events -MRI brain was negative for acute process, neurology signed off -Continue Keppra 500 mg p.o. twice daily -Will start sitter one-to-one observation, Haldol as needed for agitation  Acute on chronic hypoxemic And hypercarbic respiratory failure -She has history of COPD, possible aspiration pneumonia -Started on IV Zosyn -Continue Pulmicort, Roxy Manns -Currently on 3 L/m oxygen  Hypotension -Resolved -Not on pressors  Acute on chronic anemia -Hemoglobin dropped to 6.5, required 1 unit of PRBC -She has black-colored stool; will check stool for occult blood -Follow CBC in a.m.  Hypothyroidism -Continue Synthroid  Hypertension -Continue metoprolol  Medications     arformoterol  15 mcg Nebulization BID   aspirin  81 mg Oral Daily   budesonide (PULMICORT) nebulizer solution  0.5 mg Nebulization BID   Chlorhexidine Gluconate Cloth  6 each Topical Daily   docusate sodium  100  mg Oral BID   feeding supplement  237 mL Oral BID BM   heparin injection (subcutaneous)  5,000 Units Subcutaneous Q12H   levETIRAcetam  500 mg Oral BID   levothyroxine  88 mcg Oral QAC breakfast   metoprolol succinate  50 mg Oral Daily   multivitamin with minerals  1 tablet Oral Daily   pantoprazole  40 mg Oral QHS   revefenacin  175 mcg Nebulization Daily   rosuvastatin  10 mg Oral Daily   topiramate  50 mg Oral BID     Data Reviewed:   CBG:  Recent Labs  Lab 08/08/23 1102 08/08/23 1521 08/08/23 2023 08/09/23 1131 08/10/23 1532  GLUCAP 92 94 96 126* 104*    SpO2: 98 % O2 Flow Rate (L/min): 3 L/min FiO2 (%): 60 %    Vitals:   08/11/23 0500 08/11/23 0528 08/11/23 0809 08/11/23 0852  BP:  122/76 137/75   Pulse:  74 88   Resp:  18    Temp:  (!) 97.5 F (36.4 C) 97.9 F (36.6 C)   TempSrc:      SpO2:  100% 99% 98%  Weight: 39.7 kg     Height:          Data Reviewed:  Basic Metabolic Panel: Recent Labs  Lab 08/08/23 0922 08/08/23 1117 08/09/23 0813 08/10/23 0220 08/11/23 0601  NA 142 140 142 137 142  K 3.9 3.9 4.2 3.8 3.6  CL 90*  --  91* 86* 91*  CO2 38*  --  39* 40* 43*  GLUCOSE 164*  --  73 112* 102*  BUN 13  --  16 22 14  CREATININE 0.84  --  1.14* 0.94 0.89  CALCIUM 8.7*  --  8.6* 8.6* 8.6*  MG  --   --  1.9 2.6* 2.1  PHOS  --   --  3.5 4.2 3.3    CBC: Recent Labs  Lab 08/08/23 0922 08/08/23 1117 08/09/23 0813 08/10/23 0220 08/11/23 0601  WBC 12.7*  --  16.7* 14.5* 10.2  HGB 7.5* 8.8* 7.2* 6.5* 8.4*  HCT 26.1* 26.0* 24.0* 21.3* 28.0*  MCV 94.6  --  90.2 89.9 90.3  PLT 284  --  274 285 267    LFT Recent Labs  Lab 08/08/23 0922  AST 36  ALT 39  ALKPHOS 69  BILITOT 0.3  PROT 5.5*  ALBUMIN 2.4*     Antibiotics: Anti-infectives (From admission, onward)    Start     Dose/Rate Route Frequency Ordered Stop   08/08/23 1800  piperacillin-tazobactam (ZOSYN) IVPB 3.375 g        3.375 g 12.5 mL/hr over 240 Minutes Intravenous  Every 8 hours 08/08/23 1042 08/12/23 2359   08/08/23 1045  piperacillin-tazobactam (ZOSYN) IVPB 3.375 g        3.375 g 100 mL/hr over 30 Minutes Intravenous  Once 08/08/23 1042 08/08/23 1314        DVT prophylaxis: Discontinue heparin and start SCDs for possible GI bleed  Code Status: DNR  Family Communication:    CONSULTS palliative care, neurology, PCCM   Subjective   Continues to be confused and agitated.   Objective    Physical Examination:   General: Appears confused, mild agitation Cardiovascular: S1-S2, regular Respiratory: Lungs clear to auscultation bilaterally Abdomen: Soft, nontender, no organomegaly Extremities: No edema in the lower extremities Neurologic: Alert, confused, not following commands, moving all extremities   Status is: Inpatient:             Kendra Watts   [facility] If 7PM-7AM, please contact night-coverage at [email or web address], Office  [phone]   08/11/2023, 10:22 AM  LOS: 3 days

## 2023-08-11 NOTE — Plan of Care (Signed)
  Problem: Education: Goal: Knowledge of General Education information will improve Description: Including pain rating scale, medication(s)/side effects and non-pharmacologic comfort measures Outcome: Not Progressing   Problem: Health Behavior/Discharge Planning: Goal: Ability to manage health-related needs will improve Outcome: Not Progressing   Problem: Clinical Measurements: Goal: Ability to maintain clinical measurements within normal limits will improve Outcome: Not Progressing Goal: Will remain free from infection Outcome: Not Progressing Goal: Diagnostic test results will improve Outcome: Not Progressing Goal: Respiratory complications will improve Outcome: Not Progressing Goal: Cardiovascular complication will be avoided Outcome: Not Progressing   Problem: Activity: Goal: Risk for activity intolerance will decrease Outcome: Not Progressing   Problem: Nutrition: Goal: Adequate nutrition will be maintained Outcome: Not Progressing   Problem: Coping: Goal: Level of anxiety will decrease Outcome: Not Progressing   Problem: Elimination: Goal: Will not experience complications related to bowel motility Outcome: Not Progressing Goal: Will not experience complications related to urinary retention Outcome: Not Progressing   Problem: Pain Managment: Goal: General experience of comfort will improve and/or be controlled Outcome: Not Progressing   Problem: Safety: Goal: Ability to remain free from injury will improve Outcome: Not Progressing   Problem: Skin Integrity: Goal: Risk for impaired skin integrity will decrease Outcome: Not Progressing   Problem: Safety: Goal: Non-violent Restraint(s) Outcome: Not Progressing

## 2023-08-12 DIAGNOSIS — R531 Weakness: Secondary | ICD-10-CM | POA: Diagnosis not present

## 2023-08-12 DIAGNOSIS — J9600 Acute respiratory failure, unspecified whether with hypoxia or hypercapnia: Secondary | ICD-10-CM | POA: Diagnosis not present

## 2023-08-12 DIAGNOSIS — K921 Melena: Secondary | ICD-10-CM | POA: Diagnosis not present

## 2023-08-12 DIAGNOSIS — R410 Disorientation, unspecified: Secondary | ICD-10-CM | POA: Diagnosis not present

## 2023-08-12 LAB — OCCULT BLOOD X 1 CARD TO LAB, STOOL: Fecal Occult Bld: POSITIVE — AB

## 2023-08-12 LAB — CBC
HCT: 27.6 % — ABNORMAL LOW (ref 36.0–46.0)
Hemoglobin: 8.3 g/dL — ABNORMAL LOW (ref 12.0–15.0)
MCH: 27.5 pg (ref 26.0–34.0)
MCHC: 30.1 g/dL (ref 30.0–36.0)
MCV: 91.4 fL (ref 80.0–100.0)
Platelets: 275 10*3/uL (ref 150–400)
RBC: 3.02 MIL/uL — ABNORMAL LOW (ref 3.87–5.11)
RDW: 17.2 % — ABNORMAL HIGH (ref 11.5–15.5)
WBC: 11.7 10*3/uL — ABNORMAL HIGH (ref 4.0–10.5)
nRBC: 0 % (ref 0.0–0.2)

## 2023-08-12 LAB — BASIC METABOLIC PANEL
Anion gap: 6 (ref 5–15)
BUN: 14 mg/dL (ref 8–23)
CO2: 42 mmol/L — ABNORMAL HIGH (ref 22–32)
Calcium: 8.6 mg/dL — ABNORMAL LOW (ref 8.9–10.3)
Chloride: 94 mmol/L — ABNORMAL LOW (ref 98–111)
Creatinine, Ser: 0.79 mg/dL (ref 0.44–1.00)
GFR, Estimated: >60 mL/min (ref >60)
Glucose, Bld: 89 mg/dL (ref 70–99)
Potassium: 3.1 mmol/L — ABNORMAL LOW (ref 3.5–5.1)
Sodium: 142 mmol/L (ref 135–145)

## 2023-08-12 LAB — PHOSPHORUS: Phosphorus: 2.5 mg/dL (ref 2.5–4.6)

## 2023-08-12 LAB — MAGNESIUM: Magnesium: 2 mg/dL (ref 1.7–2.4)

## 2023-08-12 MED ORDER — POTASSIUM CHLORIDE 20 MEQ PO PACK
40.0000 meq | PACK | ORAL | Status: AC
  Administered 2023-08-12 (×2): 40 meq via ORAL
  Filled 2023-08-12 (×2): qty 2

## 2023-08-12 NOTE — Progress Notes (Addendum)
[facility]  PROGRESS NOTE  Kendra Watts:[id number] DOB: 12-22-1945 DOA: 08/08/2023 PCP: Georgianne Fick, MD   Brief HPI:   78 year old female, O2 dependent COPD, bilateral lung cancer with prior SBRT, failure to thrive, severe protein calorie malnutrition, prior seizures presented after being found obtunded at skilled nursing facility.  Patient was intubated in the ED and was admitted in ICU.  She was extubated and transferred to the medical floor.  On the floor she was noted to have episodes of staring into the space along with getting mute.  Neurology was consulted. She was placed on LTM EEG on 1/17, which has been negative.  Given prior left occipital lobe hemorrhage she is at risk for seizures.  She was given load of valproic acid and started on Keppra.   Assessment/Plan:   Acute metabolic encephalopathy/history of seizures - neurology was consulted -Felt that she was having breakthrough seizures due to hypercapnia -LTM was discontinued as it was negative for subclinical events -MRI brain was negative for acute process, neurology signed off -Continue Keppra 500 mg p.o. twice daily -Continue sitter at bedside, Haldol as needed for agitation  Acute on chronic hypoxemic And hypercarbic respiratory failure -She has history of COPD, possible aspiration pneumonia -Started on IV Zosyn; completed Zosyn today -Continue Pulmicort, Roxy Manns -Currently on 2.5 L/m oxygen  Hypotension -Resolved -Not on pressors  Acute on chronic anemia/melena -Hemoglobin dropped to 6.5, required 1 unit of PRBC -She has black-colored stool; FOBT was checked was positive. -Hemoglobin this morning is stable at 8.3 -Will consult gastroenterology; not sure if she will be a good candidate for EGD at this time -Follow CBC in a.m.  Hypothyroidism -Continue Synthroid  Hypertension -Continue metoprolol  Hypokalemia -Potassium is 3.1 -Will replace potassium and follow BMP in  am  Goals of care -Palliative care consulted for goals of care.  Medications     arformoterol  15 mcg Nebulization BID   aspirin  81 mg Oral Daily   budesonide (PULMICORT) nebulizer solution  0.5 mg Nebulization BID   Chlorhexidine Gluconate Cloth  6 each Topical Daily   docusate sodium  100 mg Oral BID   feeding supplement  237 mL Oral BID BM   levETIRAcetam  500 mg Oral BID   levothyroxine  88 mcg Oral QAC breakfast   metoprolol succinate  50 mg Oral Daily   multivitamin with minerals  1 tablet Oral Daily   pantoprazole  40 mg Oral QHS   revefenacin  175 mcg Nebulization Daily   rosuvastatin  10 mg Oral Daily   topiramate  50 mg Oral BID     Data Reviewed:   CBG:  Recent Labs  Lab 08/08/23 1102 08/08/23 1521 08/08/23 2023 08/09/23 1131 08/10/23 1532  GLUCAP 92 94 96 126* 104*    SpO2: 94 % O2 Flow Rate (L/min): 3 L/min FiO2 (%): 60 %    Vitals:   08/12/23 0700 08/12/23 0807 08/12/23 0815 08/12/23 0916  BP:  113/65  113/65  Pulse:  79 77 77  Resp:   16   Temp:  97.7 F (36.5 C)    TempSrc:  Oral    SpO2:  100% 94%   Weight: 39.5 kg     Height:          Data Reviewed:  Basic Metabolic Panel: Recent Labs  Lab 08/08/23 0922 08/08/23 1117 08/09/23 0813 08/10/23 0220 08/11/23 0601 08/12/23 0542  NA 142 140 142 137 142 142  K 3.9 3.9 4.2 3.8  3.6 3.1*  CL 90*  --  91* 86* 91* 94*  CO2 38*  --  39* 40* 43* 42*  GLUCOSE 164*  --  73 112* 102* 89  BUN 13  --  16 22 14 14   CREATININE 0.84  --  1.14* 0.94 0.89 0.79  CALCIUM 8.7*  --  8.6* 8.6* 8.6* 8.6*  MG  --   --  1.9 2.6* 2.1 2.0  PHOS  --   --  3.5 4.2 3.3 2.5    CBC: Recent Labs  Lab 08/08/23 0922 08/08/23 1117 08/09/23 0813 08/10/23 0220 08/11/23 0601 08/12/23 0542  WBC 12.7*  --  16.7* 14.5* 10.2 11.7*  HGB 7.5* 8.8* 7.2* 6.5* 8.4* 8.3*  HCT 26.1* 26.0* 24.0* 21.3* 28.0* 27.6*  MCV 94.6  --  90.2 89.9 90.3 91.4  PLT 284  --  274 285 267 275    LFT Recent Labs  Lab  08/08/23 0922  AST 36  ALT 39  ALKPHOS 69  BILITOT 0.3  PROT 5.5*  ALBUMIN 2.4*     Antibiotics: Anti-infectives (From admission, onward)    Start     Dose/Rate Route Frequency Ordered Stop   08/08/23 1800  piperacillin-tazobactam (ZOSYN) IVPB 3.375 g        3.375 g 12.5 mL/hr over 240 Minutes Intravenous Every 8 hours 08/08/23 1042 08/12/23 2359   08/08/23 1045  piperacillin-tazobactam (ZOSYN) IVPB 3.375 g        3.375 g 100 mL/hr over 30 Minutes Intravenous  Once 08/08/23 1042 08/08/23 1314        DVT prophylaxis: Discontinue heparin and start SCDs for possible GI bleed  Code Status: DNR  Family Communication:    CONSULTS palliative care, neurology, PCCM   Subjective    Continues to be confused.  Currently in soft restraints.  Sitter at bedside.  Objective    Physical Examination:  Appears pleasantly confused S1-S2, regular Lungs clear to auscultation bilaterally Abdomen soft, nontender, no organomegaly Neuro, alert, moving all extremities, confused   Status is: Inpatient:             [name]   [facility] If 7PM-7AM, please contact night-coverage at [email or web address], Office  [phone]   08/12/2023, 11:26 AM  LOS: 4 days

## 2023-08-12 NOTE — Progress Notes (Signed)
Physical Therapy Treatment Patient Details Name: Kendra Watts MRN: [id number] DOB: [birth year] Today's Date: 08/12/2023   History of Present Illness Pt is a 78 y.o. female who presented 08/08/23 after being found unresponsive in respiratory distress. Pt bagged enroute and intubated in ED and extubated same day. Concern noted for possible absence seizures. Rapid response called for episode of unresponsiveness 1/17. PMH: breast CA, COPD on chronic home 4L O2, CHF, HTN, L hip fx, L occipital lobe hemorrhagic infarcts, anemia, HLD    PT Comments  Pt tolerates treatment well, ambulating for increased distances and requiring reduced assistance for transfers. Pt does appear to remain confused, with increased processing times and poor memory. Pt will benefit from frequent mobilization in an effort to improve endurance and to reduce falls risk, as the pt lives alone at baseline. Patient will benefit from continued inpatient follow up therapy, <3 hours/day.    If plan is discharge home, recommend the following: Assistance with cooking/housework;Assist for transportation;Help with stairs or ramp for entrance;A lot of help with walking and/or transfers;A lot of help with bathing/dressing/bathroom;Direct supervision/assist for medications management;Direct supervision/assist for financial management   Can travel by private vehicle     Yes  Equipment Recommendations  Rolling walker (2 wheels);BSC/3in1 (RW and BSC if pt does not return to SNF)    Recommendations for Other Services       Precautions / Restrictions Precautions Precautions: Fall Precaution Comments: monitor O2 (supplemental O2 at baseline); bilateral wrist restraints Restrictions Weight Bearing Restrictions Per Provider Order: No     Mobility  Bed Mobility Overal bed mobility: Needs Assistance Bed Mobility: Supine to Sit, Sit to Supine     Supine to sit: Min assist, HOB elevated Sit to supine: Contact guard assist         Transfers Overall transfer level: Needs assistance Equipment used: Rolling walker (2 wheels) Transfers: Sit to/from Stand Sit to Stand: Contact guard assist                Ambulation/Gait Ambulation/Gait assistance: Contact guard assist Gait Distance (Feet): 50 Feet Assistive device: Rolling walker (2 wheels) Gait Pattern/deviations: Step-to pattern Gait velocity: reduced Gait velocity interpretation: <1.31 ft/sec, indicative of household ambulator   General Gait Details: slowed step-to gait, increased time to turn   Optometrist     Tilt Bed    Modified Rankin (Stroke Patients Only)       Balance Overall balance assessment: Needs assistance Sitting-balance support: No upper extremity supported, Feet supported Sitting balance-Leahy Scale: Good     Standing balance support: Bilateral upper extremity supported, Reliant on assistive device for balance Standing balance-Leahy Scale: Poor                              Cognition Arousal: Alert Behavior During Therapy: WFL for tasks assessed/performed Overall Cognitive Status: Impaired/Different from baseline Area of Impairment: Orientation, Attention, Memory, Following commands, Safety/judgement, Awareness, Problem solving                 Orientation Level: Disoriented to, Place, Time, Situation Current Attention Level: Focused Memory: Decreased recall of precautions, Decreased short-term memory Following Commands: Follows one step commands with increased time, Follows multi-step commands inconsistently Safety/Judgement: Decreased awareness of safety, Decreased awareness of deficits Awareness: Intellectual Problem Solving: Slow processing, Decreased initiation          Exercises  General Comments General comments (skin integrity, edema, etc.): VSS on 3L [place]      Pertinent Vitals/Pain Pain Assessment Pain Assessment: No/denies pain    Home  Living                          Prior Function            PT Goals (current goals can now be found in the care plan section) Acute Rehab PT Goals Patient Stated Goal: to get stronger Progress towards PT goals: Progressing toward goals    Frequency    Min 1X/week      PT Plan      Co-evaluation              AM-PAC PT "6 Clicks" Mobility   Outcome Measure  Help needed turning from your back to your side while in a flat bed without using bedrails?: A Little Help needed moving from lying on your back to sitting on the side of a flat bed without using bedrails?: A Little Help needed moving to and from a bed to a chair (including a wheelchair)?: A Little Help needed standing up from a chair using your arms (e.g., wheelchair or bedside chair)?: A Little Help needed to walk in hospital room?: A Little Help needed climbing 3-5 steps with a railing? : Total 6 Click Score: 16    End of Session Equipment Utilized During Treatment: Gait belt;Oxygen Activity Tolerance: Patient tolerated treatment well Patient left: in bed;with call bell/phone within reach;with bed alarm set;with restraints reapplied (bilateral wrist restraints on upon PT arrival) Nurse Communication: Mobility status PT Visit Diagnosis: Muscle weakness (generalized) (M62.81);Unsteadiness on feet (R26.81);Other abnormalities of gait and mobility (R26.89);Difficulty in walking, not elsewhere classified (R26.2)     Time: 0865-7846 PT Time Calculation (min) (ACUTE ONLY): 23 min  Charges:    $Gait Training: 8-22 mins $Therapeutic Activity: 8-22 mins PT General Charges $$ ACUTE PT VISIT: 1 Visit                     Arlyss Gandy, PT, DPT Acute Rehabilitation Office [phone]    Arlyss Gandy 08/12/2023, 1:26 PM

## 2023-08-12 NOTE — Plan of Care (Signed)

## 2023-08-13 DIAGNOSIS — R195 Other fecal abnormalities: Secondary | ICD-10-CM

## 2023-08-13 DIAGNOSIS — R410 Disorientation, unspecified: Secondary | ICD-10-CM | POA: Diagnosis not present

## 2023-08-13 DIAGNOSIS — J9600 Acute respiratory failure, unspecified whether with hypoxia or hypercapnia: Secondary | ICD-10-CM | POA: Diagnosis not present

## 2023-08-13 DIAGNOSIS — R531 Weakness: Secondary | ICD-10-CM | POA: Diagnosis not present

## 2023-08-13 LAB — CBC
HCT: 26.1 % — ABNORMAL LOW (ref 36.0–46.0)
Hemoglobin: 7.5 g/dL — ABNORMAL LOW (ref 12.0–15.0)
MCH: 26.7 pg (ref 26.0–34.0)
MCHC: 28.7 g/dL — ABNORMAL LOW (ref 30.0–36.0)
MCV: 92.9 fL (ref 80.0–100.0)
Platelets: 289 10*3/uL (ref 150–400)
RBC: 2.81 MIL/uL — ABNORMAL LOW (ref 3.87–5.11)
RDW: 17.2 % — ABNORMAL HIGH (ref 11.5–15.5)
WBC: 11.4 10*3/uL — ABNORMAL HIGH (ref 4.0–10.5)
nRBC: 0 % (ref 0.0–0.2)

## 2023-08-13 LAB — PHOSPHORUS: Phosphorus: 2.8 mg/dL (ref 2.5–4.6)

## 2023-08-13 LAB — MAGNESIUM: Magnesium: 2 mg/dL (ref 1.7–2.4)

## 2023-08-13 LAB — BASIC METABOLIC PANEL
Anion gap: 9 (ref 5–15)
BUN: 16 mg/dL (ref 8–23)
CO2: 37 mmol/L — ABNORMAL HIGH (ref 22–32)
Calcium: 8.9 mg/dL (ref 8.9–10.3)
Chloride: 99 mmol/L (ref 98–111)
Creatinine, Ser: 0.85 mg/dL (ref 0.44–1.00)
GFR, Estimated: >60 mL/min (ref >60)
Glucose, Bld: 103 mg/dL — ABNORMAL HIGH (ref 70–99)
Potassium: 4.3 mmol/L (ref 3.5–5.1)
Sodium: 145 mmol/L (ref 135–145)

## 2023-08-13 LAB — CULTURE, BLOOD (ROUTINE X 2)
Culture: NO GROWTH
Culture: NO GROWTH

## 2023-08-13 NOTE — Progress Notes (Signed)
Patient ID: ZUMA DORKO, female   DOB: [birth year], 78 y.o.   MRN: [id number]    Progress Note from the Palliative Medicine Team at [facility]   Patient Name: Kendra Watts        Date: 08/13/2023 DOB: October 09, 1945  Age: 78 y.o. MRN#: [id number] Attending Physician: Joycelyn Das, MD Primary Care Physician: Georgianne Fick, MD Admit Date: 08/08/2023   Reason for Consultation/Follow-up   Establishing Goals of Care   HPI/ Brief Hospital Review  78 y.o. 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 Pt bagged enroute and intubated in ED and extubated same day.  Rapid response called for episode of unresponsiveness 08/07/22.  Hospitalization complicated by agitation and confusion.  Patient without medical decision making capacity.  Family face treatment option decisions, advanced directive decisions and anticipatory care needs.   Subjective  Extensive chart review has been completed prior to meeting with patient/family  including labs, vital signs, imaging, progress/consult notes, orders, medications and available advance directive documents.    This NP assessed patient at the bedside as a follow up for palliative medicine needs and emotional support.  Initial palliative consult 08/09/2023  Patient remains confused, at this time there is no agitation.  She continues to have mitts.  Patient does not have medical decision-making capacity at this time.  Spoke with sister/HPOA/ Kendra Watts by phone for ongoing conversation regarding current medical situation.  Education offered on patient's multiple comorbidities and overall failure to thrive. Education offered on the limitations of medical interventions to prolong  quality of life when the body fails to thrive. We explored patient's multiple serious comorbidities specific to COPD, CHF, cancer history, CVA and acute respiratory failure resulting in this hospitalization. She remains high risk for decompensation   Plan of Care: -DNR/DNI-documented today --patient's sister is comfortable with decision, "she would not want to be put back on machines", she also tells me that patient's nephew supports DNR/DNI -At this time family request to continue to treat the treatable and hope for improvement. -Kendra Watts tells me that the first day that she can get to the hospital this week is Friday.  She is open to continue conversation with me regarding goals of care.  I expressed my concern for patient's overall long-term poor prognosis.         I reviewed with family patient's desire for a natural death document seen in EMR dated 05-17-2013.  Corrie Dandy tells me she is aware of the  document but that there is a more recent advanced directive that she will bring in for scanning. -Time for outcomes, and decision dependent on patient outcomes -PMT will continue to support holistically  Education offered today regarding  the importance of continued conversation with family and the  medical providers regarding overall plan of care and treatment options,  ensuring decisions are within the context of the patients values and GOCs.  Questions and concerns addressed   Discussed with primary team and nursing staff   Time: 50  minutes  Detailed review of medical records ( labs, imaging, vital signs), medically appropriate exam ( MS, skin, cardiac,  resp)   discussed with treatment team, counseling and education to patient, family, staff, documenting clinical information, medication management, coordination of care    Lorinda Creed  NP  Palliative Medicine Team Team Phone # [phone] Pager [phone]

## 2023-08-13 NOTE — Consult Note (Addendum)
Consultation  Referring Provider: TRH/Pokrel Primary Care Physician:  Georgianne Fick, MD Primary Gastroenterologist:  Dr.Jacobs previous   Reason for Consultation: Acute on chronic anemia, heme positive stool  HPI: Kendra Watts is a 78 y.o. female with multiple serious comorbidities.  She is a resident at [facility] nursing facility, and was found unresponsive and in respiratory distress on 08/08/2023 and brought to the emergency room.  She required intubation and was able to be extubated later that same day for acute exacerbation of chronic hypercarbic respiratory failure. She has been confused since admission, felt to have a metabolic encephalopathy.  Initially had agitation, currently pleasant and cooperative but remains confused. Palliative care has been involved and she has been made a DNR/DNI.  She has a family member/sister who plans to come to the hospital later this week to further discuss goals of care.  Patient also has history of hypertension, again chronic respiratory failure/COPD oxygen dependent on 2.5 L at baseline, history of lung cancer status post radiation in 2019 and breast cancer status postlumpectomy in 2017, also with history of peripheral arterial disease with renal artery stenosis status post stenting and has history of CVA in 2023.  Not currently anticoagulated. Her anemia-baseline around 9.29 June 2023 On 08/08/2023 hemoglobin 7.5 08/10/2023 hemoglobin 6.5, transfused 08/11/2023 hemoglobin 8.4/hematocrit 28/MCV 90.3 Yesterday hemoglobin 8.3 and today hemoglobin 7.5  She has been documented heme positive, no overt active bleeding patient is not an accurate historian but seems to think she has noted some blood in her stool prior to this admission on occasion. Is currently eating a solid diet without difficulty denies any heartburn or indigestion, no abdominal pain, unaware of any diarrhea or constipation issues.  She has not had any recent abdominal  imaging, most recent in spring 2023 after she had seen Dr. Christella Hartigan in the office with complaint of occasional bright red blood per rectum.  He felt she was high risk for sedation and that if she were to have colonoscopy this would need to be done at the hospital.  They opted for CT of the abdomen pelvis which was unremarkable and she has not been seen in our office since then. Her last colonoscopy was in 2012 with removal of 1 hyperplastic polyp  Most recent CT chest imaging with super D chest June 2024 showed an irregular solid 0.9 cm left lower lobe pulmonary nodule increased in size suspicious for malignancy other metachronous primary bronchogenic malignancy versus pulmonary metastases, also a tiny 0.5 solid right lower lobe pulmonary nodule, stable radiation fibrosis right lung apex and posterior left upper lobe, three-vessel coronary atherosclerosis and dilated main pulmonary artery suggestive of pulmonary artery hypertension, chronic T3 compression fracture  CT angio on 07/11/2023 chest negative for PE, central enlargement of pulmonary arteries consistent with pulmonary artery hypertension, right lung with 1.8 x 1.7 cm possible developing nodular lesion and enlarged left lower lobe nodule Also noted nonacute rib fractures bilaterally and pathologic fractures right second and third ribs from radiation.     Past Medical History:  Diagnosis Date   Anemia    Aortic arch anomaly    arteria lusoria    Breast cancer (HCC) 09/22/2015   Malignant   Breast cancer of upper-outer quadrant of left female breast (HCC) 09/08/2015   Cataract, immature    CHF (congestive heart failure) (HCC)    Acute CHF-06/2018   COPD (chronic obstructive pulmonary disease) (HCC)    Encephalopathy acute 11/12/2021   Femur fracture, left (HCC) 04/11/2020   left  femur fracture( greater trochanter   Heart murmur    states no known problems   History of cervical spine x-ray 11/13/2021   History of hyperthyroidism     History of radiation therapy    bilateral lungs - 10/27/18-11/02/18, Dr. Antony Blackbird   History of radiation therapy 11/28/2015   left breast 10/30/2015-11/28/2015   Dr Antony Blackbird   Hyperlipidemia    Hypertension    states under control with meds., has been on med. x "long time"   Hypokalemia    from last physical.    Hypothyroidism    Nonfunctioning kidney    left   Personal history of radiation therapy    2017   Pulmonary nodules    Bilateral   Radiation 10/30/15-11/28/15   left breast 42.72 Gy, boosted to 10 Gy   Renal artery stenosis (HCC)    Tobacco abuse    Wears partial dentures    upper and lower    Past Surgical History:  Procedure Laterality Date   ABDOMINAL ANGIOGRAM  02/18/2012   Procedure: ABDOMINAL ANGIOGRAM;  Surgeon: Runell Gess, MD;  Location: [facility] CATH LAB;  Service: Cardiovascular;;   ABDOMINAL AORTAGRAM  07/04/2014   ABDOMINAL HYSTERECTOMY  ~ 1977   partial   APPENDECTOMY     ARCH AORTOGRAM     BREAST LUMPECTOMY Left 09/22/2015   Malignant   CAROTID ANGIOGRAM N/A 02/18/2012   Procedure: CAROTID ANGIOGRAM;  Surgeon: Runell Gess, MD;  Location: [facility] CATH LAB;  Service: Cardiovascular;  Laterality: N/A;   ENDARTERECTOMY  04/02/2012   Procedure: ENDARTERECTOMY CAROTID;  Surgeon: Nada Libman, MD;  Location: [facility] OR;  Service: Vascular;  Laterality: Right;   FUDUCIAL PLACEMENT Bilateral 09/02/2018   Procedure: Placement Of Fiducial to right upper lobe & left upper lobe lung;  Surgeon: Leslye Peer, MD;  Location: [facility] OR;  Service: Thoracic;  Laterality: Bilateral;   IR THORACENTESIS ASP PLEURAL SPACE W/IMG GUIDE  07/08/2018   RADIOACTIVE SEED GUIDED PARTIAL MASTECTOMY WITH AXILLARY SENTINEL LYMPH NODE BIOPSY Left 09/22/2015   Procedure: INJECT BLUE DYE LEFT BREAST,RADIOACTIVE SEED GUIDED PARTIAL MASTECTOMY WITH AXILLARY SENTINEL LYMPH NODE BIOPSY;  Surgeon: Claud Kelp, MD;  Location: [facility];  Service: General;  Laterality: Left;   RENAL  ANGIOGRAM Left 06/08/2010   renal artery stent -  5x12 Genesis on Aviator balloon stent (Dr. Erlene Quan)   RENAL ANGIOGRAM Right 07/04/2014   Procedure: RENAL ANGIOGRAM;  Surgeon: Runell Gess, MD;  Location: [facility] CATH LAB;  Service: Cardiovascular;  Laterality: Right;   RENAL ANGIOGRAM Right 08/22/2014   Procedure: RENAL ANGIOGRAM;  Surgeon: Runell Gess, MD;  Location: [facility] CATH LAB;  Service: Cardiovascular;  Laterality: Right;   TONSILLECTOMY     as a child   VIDEO BRONCHOSCOPY WITH ENDOBRONCHIAL NAVIGATION N/A 09/02/2018   Procedure: VIDEO BRONCHOSCOPY WITH ENDOBRONCHIAL NAVIGATION;  Surgeon: Leslye Peer, MD;  Location: [facility] OR;  Service: Thoracic;  Laterality: N/A;    Prior to Admission medications   Medication Sig Start Date End Date Taking? Authorizing Provider  aspirin 81 MG tablet Take 1 tablet (81 mg total) by mouth daily. 03/21/20  Yes Hongalgi, Maximino Greenland, MD  Budeson-Glycopyrrol-Formoterol (BREZTRI AEROSPHERE) 160-9-4.8 MCG/ACT AERO Inhale 2 puffs into the lungs in the morning and at bedtime. 06/12/21  Yes Leslye Peer, MD  doxazosin (CARDURA) 8 MG tablet Take 8 mg by mouth daily.   Yes [provider]  furosemide (LASIX) 20 MG tablet Take 20 mg  by mouth daily.   Yes [provider]  levETIRAcetam (KEPPRA) 500 MG tablet Take 1 tablet (500 mg total) by mouth 2 (two) times daily. 05/20/23  Yes Lomax, Amy, NP  levothyroxine (SYNTHROID) 88 MCG tablet Take 88 mcg by mouth daily before breakfast.   Yes [provider]  metoprolol succinate (TOPROL-XL) 50 MG 24 hr tablet Take 1 tablet (50 mg total) by mouth daily. Take with or immediately following a meal. 07/18/23  Yes Pokhrel, Laxman, MD  pantoprazole (PROTONIX) 40 MG tablet Take 1 tablet (40 mg total) by mouth daily. 07/14/18  Yes Dow Adolph N, DO  rosuvastatin (CRESTOR) 10 MG tablet Take 10 mg by mouth daily.   Yes [provider]  OXYGEN Inhale 2-4 L into the lungs as needed. As needed to  maintain SATS > 90%    [provider]    Current Facility-Administered Medications  Medication Dose Route Frequency Provider Last Rate Last Admin   arformoterol (BROVANA) nebulizer solution 15 mcg  15 mcg Nebulization BID Lorin Glass, MD   15 mcg at 08/13/23 3086   aspirin chewable tablet 81 mg  81 mg Oral Daily Lorin Glass, MD   81 mg at 08/12/23 0916   budesonide (PULMICORT) nebulizer solution 0.5 mg  0.5 mg Nebulization BID Lorin Glass, MD   0.5 mg at 08/13/23 5784   Chlorhexidine Gluconate Cloth 2 % PADS 6 each  6 each Topical Daily Lorin Glass, MD   6 each at 08/12/23 6962   docusate sodium (COLACE) capsule 100 mg  100 mg Oral BID PRN Lorin Glass, MD       docusate sodium (COLACE) capsule 100 mg  100 mg Oral BID Lorin Glass, MD   100 mg at 08/11/23 2325   feeding supplement (ENSURE ENLIVE / ENSURE PLUS) liquid 237 mL  237 mL Oral BID BM Lorin Glass, MD   237 mL at 08/12/23 1452   haloperidol lactate (HALDOL) injection 5 mg  5 mg Intramuscular Q6H PRN [name], MD       hydrALAZINE (APRESOLINE) injection 20 mg  20 mg Intravenous Q6H PRN Massie Maroon, MD   20 mg at 08/09/23 0631   levETIRAcetam (KEPPRA) tablet 500 mg  500 mg Oral BID Luciano Cutter, MD   500 mg at 08/12/23 2159   levothyroxine (SYNTHROID) tablet 88 mcg  88 mcg Oral QAC breakfast Lorin Glass, MD   88 mcg at 08/12/23 0557   metoprolol succinate (TOPROL-XL) 24 hr tablet 50 mg  50 mg Oral Daily Lorin Glass, MD   50 mg at 08/12/23 9528   multivitamin with minerals tablet 1 tablet  1 tablet Oral Daily Lynnell Catalan, MD   1 tablet at 08/12/23 0916   ondansetron (ZOFRAN) injection 4 mg  4 mg Intravenous Q6H PRN Lorin Glass, MD       Oral care mouth rinse  15 mL Mouth Rinse PRN Lorin Glass, MD       pantoprazole (PROTONIX) EC tablet 40 mg  40 mg Oral QHS Lorin Glass, MD   40 mg at 08/12/23 2200   polyethylene glycol (MIRALAX / GLYCOLAX) packet 17 g  17 g Oral Daily  PRN Lorin Glass, MD       revefenacin (YUPELRI) nebulizer solution 175 mcg  175 mcg Nebulization Daily Lorin Glass, MD   175 mcg at 08/13/23 0846   rosuvastatin (CRESTOR) tablet 10 mg  10  mg Oral Daily Lorin Glass, MD   10 mg at 08/12/23 6010   topiramate (TOPAMAX) tablet 50 mg  50 mg Oral BID Lynnell Catalan, MD   50 mg at 08/12/23 2200    Allergies as of 08/08/2023   (No Known Allergies)    Family History  Problem Relation Age of Onset   Heart disease Mother        MI @ 36, died at 61   Cancer Father    Lung cancer Father    Lung cancer Sister    Breast cancer Paternal Aunt    Heart disease Maternal Grandmother    Stroke Neg Hx     Social History   Socioeconomic History   Marital status: Divorced    Spouse name: Not on file   Number of children: 0   Years of education: College   Highest education level: Bachelor's degree (e.g., BA, AB, BS)  Occupational History   Occupation: HR Sales promotion account executive: [employer]  Tobacco Use   Smoking status: Every Day    Current packs/day: 0.50    Average packs/day: 0.5 packs/day for 40.0 years (20.0 ttl pk-yrs)    Types: Cigarettes   Smokeless tobacco: Never   Tobacco comments:    15 cigarettes smoked daily ARJ 04/19/21  Vaping Use   Vaping status: Never Used  Substance and Sexual Activity   Alcohol use: Yes    Alcohol/week: 4.0 standard drinks of alcohol    Types: 4 Cans of beer per week    Comment: occasionally   Drug use: No   Sexual activity: Never  Other Topics Concern   Not on file  Social History Narrative   Left Handed    1 Cup of Coffee per Day   Social Drivers of Health   Financial Resource Strain: Low Risk  (06/20/2020)   Overall Financial Resource Strain (CARDIA)    Difficulty of Paying Living Expenses: Not hard at all  Food Insecurity: No Food Insecurity (07/12/2023)   Hunger Vital Sign    Worried About Running Out of Food in the Last Year: Never true    Ran Out of Food in the Last  Year: Never true  Transportation Needs: No Transportation Needs (07/12/2023)   PRAPARE - Administrator, Civil Service (Medical): No    Lack of Transportation (Non-Medical): No  Physical Activity: Insufficiently Active (06/20/2020)   Exercise Vital Sign    Days of Exercise per Week: 2 days    Minutes of Exercise per Session: 50 min  Stress: No Stress Concern Present (06/20/2020)   [employer] of Occupational Health - Occupational Stress Questionnaire    Feeling of Stress : Only a little  Social Connections: Moderately Isolated (06/20/2020)   Social Connection and Isolation Panel [NHANES]    Frequency of Communication with Friends and Family: More than three times a week    Frequency of Social Gatherings with Friends and Family: More than three times a week    Attends Religious Services: 1 to 4 times per year    Active Member of [employer] or Organizations: No    Attends Banker Meetings: Never    Marital Status: Divorced  Catering manager Violence: Not At Risk (07/12/2023)   Humiliation, Afraid, Rape, and Kick questionnaire    Fear of Current or Ex-Partner: No    Emotionally Abused: No    Physically Abused: No    Sexually Abused: No    Review of Systems: Pertinent positive  and negative review of systems were noted in the above HPI section.  All other review of systems was otherwise negative.   Physical Exam: Vital signs in last 24 hours: Temp:  [97.8 F (36.6 C)-99.1 F (37.3 C)] 97.8 F (36.6 C) (01/22 0812) Pulse Rate:  [66-83] 83 (01/22 0812) Resp:  [18] 18 (01/22 0455) BP: (99-111)/(58-67) 102/59 (01/22 0812) SpO2:  [91 %-100 %] 95 % (01/22 0846) Last BM Date : 08/13/23 General:   Alert,  Well-developed, thin, frail, elderly white female pleasant and cooperative in NAD, oriented x 2-on O2 at 2.5 L Head:  Normocephalic and atraumatic. Eyes:  Sclera clear, no icterus.   Conjunctiva pink. Ears:  Normal auditory acuity. Nose:  No deformity,  discharge,  or lesions. Mouth:  No deformity or lesions.   Neck:  Supple; no masses or thyromegaly. Lungs: Decreased breath sounds bilaterally , scattered rhonchi  heart:  Regular rate and rhythm; no murmurs, clicks, rubs,  or gallops. Abdomen:  Soft,nontender, BS active,nonpalp mass or hsm.   Rectal: Not done, stool documented heme positive Msk:  Symmetrical without gross deformities. . Pulses:  Normal pulses noted. Extremities:  Without clubbing or edema. Neurologic:  Alert and  oriented x2; moderately confused Skin:  Intact without significant lesions or rashes.. Psych:  Alert and cooperative. Pleasantly confused  Intake/Output from previous day: 01/21 0701 - 01/22 0700 In: 360 [P.O.:360] Out: -  Intake/Output this shift: No intake/output data recorded.  Lab Results: Recent Labs    08/11/23 0601 08/12/23 0542 08/13/23 0548  WBC 10.2 11.7* 11.4*  HGB 8.4* 8.3* 7.5*  HCT 28.0* 27.6* 26.1*  PLT 267 275 289   BMET Recent Labs    08/11/23 0601 08/12/23 0542 08/13/23 0548  NA 142 142 145  K 3.6 3.1* 4.3  CL 91* 94* 99  CO2 43* 42* 37*  GLUCOSE 102* 89 103*  BUN 14 14 16   CREATININE 0.89 0.79 0.85  CALCIUM 8.6* 8.6* 8.9   LFT No results for input(s): "PROT", "ALBUMIN", "AST", "ALT", "ALKPHOS", "BILITOT", "BILIDIR", "IBILI" in the last 72 hours. PT/INR No results for input(s): "LABPROT", "INR" in the last 72 hours. Hepatitis Panel No results for input(s): "HEPBSAG", "HCVAB", "HEPAIGM", "HEPBIGM" in the last 72 hours.   IMPRESSION:  #71 78 year old white female with chronic respiratory failure/COPD on chronic O2 at 2.5 L, nursing home resident who was admitted on 08/08/2023 after being found unresponsive and in respiratory distress. She required intubation, able to be extubated within 24 hours, and is being managed for acute exacerbation of chronic hypercarbic respiratory failure.  She has completed course of Zosyn.  #2 acute metabolic encephalopathy felt secondary  to above initially with agitation and confusion, agitation has resolved now pleasantly confused  #3 acute on chronic normocytic anemia, documented heme positive this admission without evidence of overt bleeding.  Patient had been seen in our office in 2023 with complaints of occasional bright red blood per rectum and felt too high risk to undergo outpatient colonoscopy at that time, decision was made to pursue CT of the abdomen pelvis which was unremarkable.  Etiology of her heme positive stool is not clear, numerous possibilities upper versus lower Baseline hemoglobin 9.5-10  #4 history of lung cancer status post radiation 2019 has slowly enlarging lung nodules as above #5 pulmonary artery hypertension #6 history of breast cancer status postlumpectomy 2017 #7.  History of CVA 2023 #8 hypertension  PLAN: Agree with continuing Protonix 40 mg p.o. daily Continue to follow hemoglobin, transfuse if  indicated Patient is high risk for complications with sedation, and per palliative care conversation family has opted for DNR/DNI, with further family conversations to be ongoing later in the week.  She has a sister coming to the hospital on Friday. She has not actively bleeding, will try to avoid endoscopic evaluation. Consider CT of the abdomen and pelvis with contrast, to rule out any obvious malignancy and if negative and was managed supportively/conservatively   Amy EsterwoodPA-C  08/13/2023, 10:29 AM     Attending physician's note   I have taken history, reviewed the chart and examined the patient. I performed a substantive portion of this encounter, including complete performance of at least one of the key components, in conjunction with the APP. I agree with the Advanced Practitioner's note, impression and recommendations.   78yr old frail NH resident adm after being found unresponsive and in resp failure req intubation, now extubated.  She has acute metabolic encephalopathy.  GI being  consulted for heme positive stools without overt bleeding. Hb did drop to 6.5, s/p 1U to 7.5.  Plan: -Recommend to hold off on endo procedures and managed conservatively- too frail -Trend CBC.  Transfuse if needed. -Agree with palliative care. -Continue Protonix 40 mg p.o. daily empirically. -Pl call if any active bleeding. Will sign off for now. -D/W family.   Edman Circle, MD [facility] [phone]

## 2023-08-13 NOTE — Progress Notes (Addendum)
PROGRESS NOTE  Kendra Watts UEA:[id number] DOB: [birth year] DOA: 08/08/2023 PCP: Georgianne Fick, MD   LOS: 5 days   Brief narrative:  78 year old female with past medical history of O2 dependent COPD,  lung cancer with prior SBRT, failure to thrive, severe protein calorie malnutrition, prior seizures, peripheral artery disease, chronic heart failure with preserved ejection fraction, hypertension, hypothyroidism presented presented to hospital after being found obtunded at the skilled nursing facility.  Patient was intubated in the ED and was admitted in ICU.  Subsequently, she was extubated and transferred to the medical floor.  On the floor she was noted to have episodes of staring into the space along with getting mute.  Neurology was consulted.  She was put on long-term EEG on 1/17 which was negative.  Given her prior left occipital hemorrhage she was considered to be high risk for seizures so was given load of valproic acid and Keppra.  Assessment/Plan: Principal Problem:   AMS (altered mental status)   Acute metabolic encephalopathy/history of seizures delirium. Neurology was consulted.  Long-term EEG monitoring was done which was negative so discontinued.  MRI of the brain was negative for acute findings..  On Keppra.  Haldol as needed.  Neurology has signed off.  On restraints.  Will wean off restraints as possible.   Acute on chronic anemia with melena During hospitalization hemoglobin dropped to 6.5 and required 1 unit of PRBC. FOBT was positive.  GI was notified.  Hemoglobin of 7.5 today.  Hold off with aspirin for now due to ongoing melena.  Transfuse as necessary.  Check CBC in AM.  Follow GI recommendation.  Continue Protonix.   Acute on chronic hypoxemic and hypercarbic respiratory failure History of oxygen dependent COPD, question aspiration.  Has completed Zosyn.  Continue Pulmicort, Roxy Manns Currently on 3 L of oxygen by nasal cannula.   Essential  Hypertension:  on amlodipine and Lopressor, Cardura as outpatient.  Had hypotension which has resolved at this time.  Off pressors.  Hypothyroidism: Continue Synthroid.   Hypokalemia: Improved after repletion.  Latest potassium of 4.3.  Recent history of Non-STEMI:  Seen by cardiology in previous admission and was on heparin drip and was subsequently started on metoprolol.  Pulmonary nodule:  1.6 cm superior segment of the left lower lobe.  Follows up with Dr. Delton Coombes, pulmonary as outpatient.   Plan was to repeat another CT chest in 6 months    Chronic HFpEF   2D echocardiogram from 4/23 with LV ejection fraction of 55 to 60% with diastolic dysfunction.  Diuretics at this time.     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 Continue crestor   PAD: Extensive PAD history with renal artery stenosis status post bilateral renal artery stenting, carotid disease status post right CEA, mild lower extremity arterial disease treated medically.  Continue aspirin and statins.   Debility deconditioning.  Patient has been seen by physical therapy and recommend skilled nursing facility.  Goals of care.  Palliative care on board.  Patient is DNR.  Overall prognosis of the patient is poor.  DVT prophylaxis: Place and maintain sequential compression device Start: 08/11/23 1647 SCDs Start: 08/08/23 1021   Disposition: Skilled nursing facility, ongoing palliative care discussion.  Status is: Inpatient  Remains inpatient appropriate because: Pending clinical improvement, need for skilled nursing facility.    Code Status:     Code Status: Do not attempt resuscitation (DNR) - Comfort care  Family Communication: None at bedside.  Consultants: Palliative care Neurology GI  Procedures: Long-term EEG PRBC transfusion 1 unit Intubation, mechanical ventilation and extubation.  Anti-infectives:  None currently  Anti-infectives (From  admission, onward)    Start     Dose/Rate Route Frequency Ordered Stop   08/08/23 1800  piperacillin-tazobactam (ZOSYN) IVPB 3.375 g        3.375 g 12.5 mL/hr over 240 Minutes Intravenous Every 8 hours 08/08/23 1042 08/12/23 2359   08/08/23 1045  piperacillin-tazobactam (ZOSYN) IVPB 3.375 g        3.375 g 100 mL/hr over 30 Minutes Intravenous  Once 08/08/23 1042 08/08/23 1314       Subjective: Today, patient was seen and examined at bedside.  Nursing staff at bedside reports that she did have some black melanotic stools.  Patient denies any chest pain, shortness of breath or dyspnea but on supplemental oxygen.  Appears to be calm as per the nursing staff.  Answering questions.  Objective: Vitals:   08/13/23 0455 08/13/23 0812  BP: 111/67 (!) 102/59  Pulse: 83 83  Resp: 18   Temp: 99.1 F (37.3 C) 97.8 F (36.6 C)  SpO2: 98% 91%    Intake/Output Summary (Last 24 hours) at 08/13/2023 0832 Last data filed at 08/13/2023 0300 Gross per 24 hour  Intake 360 ml  Output --  Net 360 ml   Filed Weights   08/10/23 0500 08/11/23 0500 08/12/23 0700  Weight: 39.5 kg 39.7 kg 39.5 kg   Body mass index is 17.01 kg/m.   Physical Exam:  GENERAL: Thinly built, awake and Communicative, not in obvious distress.  Bilateral upper extremity mittens.  Appears chronically ill and frail. HENT: Pallor noted.  Pupils equally reactive to light. Oral mucosa is moist, on nasal cannula oxygen NECK: is supple, no gross swelling noted. CHEST: .  Diminished breath sounds bilaterally. CVS: S1 and S2 heard, no murmur. Regular rate and rhythm.  ABDOMEN: Soft, non-tender, bowel sounds are present.  External urinary catheter in place. EXTREMITIES: No edema.  Thin extremities. CNS: Cranial nerves are intact. No focal motor deficits.  Confused SKIN: warm and dry without rashes.  Data Review: I have personally reviewed the following laboratory data and studies,  CBC: Recent Labs  Lab 08/09/23 0813  08/10/23 0220 08/11/23 0601 08/12/23 0542 08/13/23 0548  WBC 16.7* 14.5* 10.2 11.7* 11.4*  HGB 7.2* 6.5* 8.4* 8.3* 7.5*  HCT 24.0* 21.3* 28.0* 27.6* 26.1*  MCV 90.2 89.9 90.3 91.4 92.9  PLT 274 285 267 275 289   Basic Metabolic Panel: Recent Labs  Lab 08/09/23 0813 08/10/23 0220 08/11/23 0601 08/12/23 0542 08/13/23 0548  NA 142 137 142 142 145  K 4.2 3.8 3.6 3.1* 4.3  CL 91* 86* 91* 94* 99  CO2 39* 40* 43* 42* 37*  GLUCOSE 73 112* 102* 89 103*  BUN 16 22 14 14 16   CREATININE 1.14* 0.94 0.89 0.79 0.85  CALCIUM 8.6* 8.6* 8.6* 8.6* 8.9  MG 1.9 2.6* 2.1 2.0 2.0  PHOS 3.5 4.2 3.3 2.5 2.8   Liver Function Tests: Recent Labs  Lab 08/08/23 0922  AST 36  ALT 39  ALKPHOS 69  BILITOT 0.3  PROT 5.5*  ALBUMIN 2.4*   No results for input(s): "LIPASE", "AMYLASE" in the last 168 hours. No results for input(s): "AMMONIA" in the last 168 hours. Cardiac Enzymes: Recent Labs  Lab 08/08/23 1022  CKTOTAL 30*   BNP (last 3 results) Recent Labs    07/11/23 0841 08/08/23 0922  BNP 1,494.1* 582.8*  ProBNP (last 3 results) No results for input(s): "PROBNP" in the last 8760 hours.  CBG: Recent Labs  Lab 08/08/23 1102 08/08/23 1521 08/08/23 2023 08/09/23 1131 08/10/23 1532  GLUCAP 92 94 96 126* 104*   Recent Results (from the past 240 hours)  Blood culture (routine x 2)     Status: None (Preliminary result)   Collection Time: 08/08/23 10:47 AM   Specimen: BLOOD LEFT ARM  Result Value Ref Range Status   Specimen Description BLOOD LEFT ARM  Final   Special Requests   Final    BOTTLES DRAWN AEROBIC AND ANAEROBIC Blood Culture results may not be optimal due to an inadequate volume of blood received in culture bottles   Culture   Final    NO GROWTH 4 DAYS Performed at [facility], [address], [place], [place] [address]    Report Status PENDING  Incomplete  Resp panel by RT-PCR (RSV, Flu A&B, Covid) Anterior Nasal Swab     Status: None   Collection Time:  08/08/23 10:48 AM   Specimen: Anterior Nasal Swab  Result Value Ref Range Status   SARS Coronavirus 2 by RT PCR NEGATIVE NEGATIVE Final   Influenza A by PCR NEGATIVE NEGATIVE Final   Influenza B by PCR NEGATIVE NEGATIVE Final    Comment: (NOTE) The Xpert Xpress SARS-CoV-2/FLU/RSV plus assay is intended as an aid in the diagnosis of influenza from Nasopharyngeal swab specimens and should not be used as a sole basis for treatment. Nasal washings and aspirates are unacceptable for Xpert Xpress SARS-CoV-2/FLU/RSV testing.  Fact Sheet for Patients: BloggerCourse.com  Fact Sheet for Healthcare Providers: SeriousBroker.it  This test is not yet approved or cleared by the [place] FDA and has been authorized for detection and/or diagnosis of SARS-CoV-2 by FDA under an Emergency Use Authorization (EUA). This EUA will remain in effect (meaning this test can be used) for the duration of the COVID-19 declaration under Section 564(b)(1) of the Act, 21 U.S.C. section 360bbb-3(b)(1), unless the authorization is terminated or revoked.     Resp Syncytial Virus by PCR NEGATIVE NEGATIVE Final    Comment: (NOTE) Fact Sheet for Patients: BloggerCourse.com  Fact Sheet for Healthcare Providers: SeriousBroker.it  This test is not yet approved or cleared by the [place] FDA and has been authorized for detection and/or diagnosis of SARS-CoV-2 by FDA under an Emergency Use Authorization (EUA). This EUA will remain in effect (meaning this test can be used) for the duration of the COVID-19 declaration under Section 564(b)(1) of the Act, 21 U.S.C. section 360bbb-3(b)(1), unless the authorization is terminated or revoked.  Performed at [facility], [address] [place], [place] [address]   Blood culture (routine x 2)     Status: None (Preliminary result)   Collection Time: 08/08/23  12:10 PM   Specimen: BLOOD  Result Value Ref Range Status   Specimen Description BLOOD SITE NOT SPECIFIED  Final   Special Requests   Final    BOTTLES DRAWN AEROBIC AND ANAEROBIC Blood Culture results may not be optimal due to an inadequate volume of blood received in culture bottles   Culture   Final    NO GROWTH 4 DAYS Performed at [facility], [address], [place], [place] [address]    Report Status PENDING  Incomplete  MRSA Next Gen by PCR, Nasal     Status: None   Collection Time: 08/08/23 12:48 PM   Specimen: Nasal Mucosa; Nasal Swab  Result  Value Ref Range Status   MRSA by PCR Next Gen NOT DETECTED NOT DETECTED Final    Comment: (NOTE) The GeneXpert MRSA Assay (FDA approved for NASAL specimens only), is one component of a comprehensive MRSA colonization surveillance program. It is not intended to diagnose MRSA infection nor to guide or monitor treatment for MRSA infections. Test performance is not FDA approved in patients less than 59 years old. Performed at [facility], [address], [place], [place] [address]      Studies: No results found.    Joycelyn Das, MD  [facility] 08/13/2023  If 7PM-7AM, please contact night-coverage

## 2023-08-13 NOTE — Plan of Care (Signed)
Restraint   Problem: Safety: Goal: Non-violent Restraint(s) Outcome: Not Progressing

## 2023-08-13 NOTE — Progress Notes (Signed)
Mobility Specialist: Progress Note   08/13/23 1142  Mobility  Activity Ambulated with assistance in room  Level of Assistance Contact guard assist, steadying assist  Assistive Device Front wheel walker  Distance Ambulated (ft) 30 ft  Activity Response Tolerated well  Mobility Referral Yes  Mobility visit 1 Mobility  Mobility Specialist Start Time (ACUTE ONLY) [id number]  Mobility Specialist Stop Time (ACUTE ONLY) 0944  Mobility Specialist Time Calculation (min) (ACUTE ONLY) 40 min    Pt was agreeable to mobility session - received in bed. Had a BM in bed - MS assisted with pericare and linen change. CG throughout. Ambulate to the door and back with no complaints. VSS throughout on 3LO2. Left in bed with all needs met, call bell in reach. NT aware.   Maurene Capes Mobility Specialist Please contact via SecureChat or Rehab office at [phone]

## 2023-08-13 NOTE — Progress Notes (Signed)
Occupational Therapy Treatment Patient Details Name: Kendra Watts MRN: [id number] DOB: 06-16-1946 Today's Date: 08/13/2023   History of present illness Pt is a 78 y.o. female who presented 08/08/23 after being found unresponsive in respiratory distress. Pt bagged enroute and intubated in ED and extubated same day. Concern noted for possible absence seizures. Rapid response called for episode of unresponsiveness 1/17. PMH: breast CA, COPD on chronic home 4L O2, CHF, HTN, L hip fx, L occipital lobe hemorrhagic infarcts, anemia, HLD   OT comments  Pt with gradually improving cognition and mgmt of basic ADLs. Session focused on self feeding breakfast bed level w/ Setup to Min A required, as well as intermittent cues to attend to task. Pt continues to be A&Ox1 but verbalized understanding of potential detriments of not wearing O2 during session. Continue to recommend inpatient follow up therapy, <3 hours/day at DC.      If plan is discharge home, recommend the following:  A lot of help with bathing/dressing/bathroom;Assistance with cooking/housework;Direct supervision/assist for medications management;Assist for transportation;Help with stairs or ramp for entrance;Direct supervision/assist for financial management;A little help with walking and/or transfers   Equipment Recommendations  None recommended by OT    Recommendations for Other Services      Precautions / Restrictions Precautions Precautions: Fall Precaution Comments: monitor O2 (supplemental O2 at baseline); bilateral wrist restraints Restrictions Weight Bearing Restrictions Per Provider Order: No       Mobility Bed Mobility                    Transfers                         Balance                                           ADL either performed or assessed with clinical judgement   ADL Overall ADL's : Needs assistance/impaired Eating/Feeding: Minimal assistance;Set up;Bed  level Eating/Feeding Details (indicate cue type and reason): NT at bedside assisting with breakfast w/ pt restrained. removed restraints and placed tray in front of pt and assisted to stab food with fork w/ pt then able to complete self feeding. pt able to reach cup to drink when placed within reach. occasional cues to attend to task and increased time to complete                                        Extremity/Trunk Assessment Upper Extremity Assessment Upper Extremity Assessment: Defer to OT evaluation;Generalized weakness;Left hand dominant   Lower Extremity Assessment Lower Extremity Assessment: Defer to PT evaluation        Vision   Vision Assessment?: No apparent visual deficits   Perception     Praxis      Cognition Arousal: Alert Behavior During Therapy: WFL for tasks assessed/performed Overall Cognitive Status: Impaired/Different from baseline Area of Impairment: Orientation, Attention, Memory, Following commands, Safety/judgement, Awareness, Problem solving                 Orientation Level: Disoriented to, Place, Time, Situation Current Attention Level: Sustained Memory: Decreased recall of precautions, Decreased short-term memory Following Commands: Follows one step commands with increased time, Follows multi-step commands inconsistently Safety/Judgement: Decreased awareness of safety, Decreased awareness of deficits  Awareness: Intellectual Problem Solving: Slow processing, Decreased initiation General Comments: following one step commands consistently. reports being at [facility] ([facility] from chart) and unsure what is going on medically. reoriented as able during session. pt showing some insight into mgmt of needs outside of hospital (asking about getting apartment number to renew lease and pay rent but unable to report correct apartment name or address compared to chart)        Exercises      Shoulder Instructions        General Comments on 3 L O2 during session.    Pertinent Vitals/ Pain       Pain Assessment Pain Assessment: No/denies pain  Home Living                                          Prior Functioning/Environment              Frequency  Min 1X/week        Progress Toward Goals  OT Goals(current goals can now be found in the care plan section)  Progress towards OT goals: Progressing toward goals  Acute Rehab OT Goals Patient Stated Goal: have some orange juice OT Goal Formulation: With patient Time For Goal Achievement: 08/23/23 Potential to Achieve Goals: Fair ADL Goals Pt Will Perform Grooming: with supervision;sitting Pt Will Perform Upper Body Bathing: with set-up;sitting Pt Will Perform Lower Body Bathing: with mod assist;sit to/from stand;sitting/lateral leans Pt Will Perform Lower Body Dressing: with mod assist;sitting/lateral leans;sit to/from stand Pt Will Transfer to Toilet: with min assist;stand pivot transfer;bedside commode Pt Will Perform Toileting - Clothing Manipulation and hygiene: with supervision;sit to/from stand Additional ADL Goal #1: Pt to sustain attention to basic ADL tasks > 3 min without verbal cues  Plan      Co-evaluation                 AM-PAC OT "6 Clicks" Daily Activity     Outcome Measure   Help from another person eating meals?: A Little Help from another person taking care of personal grooming?: A Little Help from another person toileting, which includes using toliet, bedpan, or urinal?: A Lot Help from another person bathing (including washing, rinsing, drying)?: A Lot Help from another person to put on and taking off regular upper body clothing?: A Lot Help from another person to put on and taking off regular lower body clothing?: A Lot 6 Click Score: 14    End of Session Equipment Utilized During Treatment: Oxygen  OT Visit Diagnosis: Unsteadiness on feet (R26.81);Other abnormalities of gait and  mobility (R26.89);History of falling (Z91.81);Other symptoms and signs involving cognitive function   Activity Tolerance Patient tolerated treatment well   Patient Left in bed;with call bell/phone within reach;with bed alarm set;with restraints reapplied   Nurse Communication Mobility status;Other (comment) (self feeding; discussed with NT)        Time: 1884-1660 OT Time Calculation (min): 41 min  Charges: OT General Charges $OT Visit: 1 Visit OT Treatments $Self Care/Home Management : 38-52 mins  Bradd Canary, OTR/L Acute Rehab Services Office: [phone]   Lorre Munroe 08/13/2023, 11:13 AM

## 2023-08-14 DIAGNOSIS — R410 Disorientation, unspecified: Secondary | ICD-10-CM | POA: Diagnosis not present

## 2023-08-14 LAB — CBC WITH DIFFERENTIAL/PLATELET
Abs Immature Granulocytes: 0.17 10*3/uL — ABNORMAL HIGH (ref 0.00–0.07)
Basophils Absolute: 0.1 10*3/uL (ref 0.0–0.1)
Basophils Relative: 1 %
Eosinophils Absolute: 0.4 10*3/uL (ref 0.0–0.5)
Eosinophils Relative: 4 %
HCT: 23.9 % — ABNORMAL LOW (ref 36.0–46.0)
Hemoglobin: 7 g/dL — ABNORMAL LOW (ref 12.0–15.0)
Immature Granulocytes: 2 %
Lymphocytes Relative: 14 %
Lymphs Abs: 1.4 10*3/uL (ref 0.7–4.0)
MCH: 27.3 pg (ref 26.0–34.0)
MCHC: 29.3 g/dL — ABNORMAL LOW (ref 30.0–36.0)
MCV: 93.4 fL (ref 80.0–100.0)
Monocytes Absolute: 1 10*3/uL (ref 0.1–1.0)
Monocytes Relative: 10 %
Neutro Abs: 7 10*3/uL (ref 1.7–7.7)
Neutrophils Relative %: 69 %
Platelets: 273 10*3/uL (ref 150–400)
RBC: 2.56 MIL/uL — ABNORMAL LOW (ref 3.87–5.11)
RDW: 17.3 % — ABNORMAL HIGH (ref 11.5–15.5)
WBC: 10.1 10*3/uL (ref 4.0–10.5)
nRBC: 0 % (ref 0.0–0.2)

## 2023-08-14 LAB — PREPARE RBC (CROSSMATCH)

## 2023-08-14 LAB — BASIC METABOLIC PANEL
Anion gap: 8 (ref 5–15)
BUN: 17 mg/dL (ref 8–23)
CO2: 35 mmol/L — ABNORMAL HIGH (ref 22–32)
Calcium: 8.9 mg/dL (ref 8.9–10.3)
Chloride: 100 mmol/L (ref 98–111)
Creatinine, Ser: 0.74 mg/dL (ref 0.44–1.00)
GFR, Estimated: >60 mL/min (ref >60)
Glucose, Bld: 102 mg/dL — ABNORMAL HIGH (ref 70–99)
Potassium: 4 mmol/L (ref 3.5–5.1)
Sodium: 143 mmol/L (ref 135–145)

## 2023-08-14 LAB — MAGNESIUM: Magnesium: 2.1 mg/dL (ref 1.7–2.4)

## 2023-08-14 LAB — HEMOGLOBIN AND HEMATOCRIT, BLOOD
HCT: 29.5 % — ABNORMAL LOW (ref 36.0–46.0)
Hemoglobin: 9 g/dL — ABNORMAL LOW (ref 12.0–15.0)

## 2023-08-14 MED ORDER — SODIUM CHLORIDE 0.9% IV SOLUTION: Freq: Once | INTRAVENOUS | Status: AC

## 2023-08-14 NOTE — Progress Notes (Addendum)
Mobility Specialist: Progress Note   08/14/23 1050  Mobility  Activity Ambulated with assistance in room  Level of Assistance Contact guard assist, steadying assist  Assistive Device Front wheel walker  Distance Ambulated (ft) 55 ft  Activity Response Tolerated well  Mobility Referral Yes  Mobility visit 1 Mobility  Mobility Specialist Start Time (ACUTE ONLY) 0843  Mobility Specialist Stop Time (ACUTE ONLY) 0912  Mobility Specialist Time Calculation (min) (ACUTE ONLY) 29 min    Pt was agreeable to mobility session - received in bed off [place]. SpO2 desat to 35-38% RA with good pleth- checked multiple fingers via pulse ox. Denied any SOB, dizziness, or lightheadedness, still talking but moving slow which is similar to baseline. Replaced  and titrated to 3LO2 - SpO2 maintained 93% 3LO2. Pt more alert and talkative afterward.  CG throughout, ambulated 2 laps around the room. Desat 2x to SpO2 85-88% 3LO2 but recovered quickly with standing break and cues for PLB. Cognitive deficits still limiting mobility, requiring mod cues for RW use and proximity. Would tilt the RW onto back two legs and try to push it. Left in bed with all needs met, call bell in reach. Bed alarm on.    Maurene Capes Mobility Specialist Please contact via SecureChat or Rehab office at [phone]

## 2023-08-14 NOTE — Progress Notes (Signed)
PROGRESS NOTE  Kendra Watts WGN:[id number] DOB: [birth year] DOA: 08/08/2023 PCP: Georgianne Fick, MD   LOS: 6 days   Brief narrative:  78 year old female with past medical history of O2 dependent COPD,  lung cancer with prior SBRT, failure to thrive, severe protein calorie malnutrition, prior seizures, peripheral artery disease, chronic heart failure with preserved ejection fraction, hypertension, hypothyroidism presented presented to hospital after being found obtunded at the skilled nursing facility.  Patient was intubated in the ED and was admitted in ICU.  Subsequently, she was extubated and transferred to the medical floor.  On the floor she was noted to have episodes of staring into the space along with getting mute.  Neurology was consulted.  She was put on long-term EEG on 1/17 which was negative.  Given her prior left occipital hemorrhage she was considered to be high risk for seizures so was given load of valproic acid and Keppra.  Assessment/Plan: Principal Problem:   AMS (altered mental status)   Acute metabolic encephalopathy/history of seizures, delirium. Neurology was consulted.  Long-term EEG monitoring was done which was negative so discontinued.  MRI of the brain was negative for acute findings..  On Keppra.  Haldol as needed.  Neurology has signed off.  off restraints today.   Acute on chronic anemia with melena During hospitalization, hemoglobin dropped to 6.5 and required 1 unit of PRBC. FOBT was positive.  GI was notified.  Hemoglobin of 7.0  from 7.5.  Hold off with aspirin for now due to ongoing melena.  Hemoglobin of 7.0 today so we will transfuse 1 unit of packed RBC.  Check CBC in AM.  Seen by GI and has been signed off since patient is not a good candidate for aggressive  intervention.  Recommendation is to continue Protonix.   Acute on chronic hypoxemic and hypercarbic respiratory failure History of oxygen dependent COPD, question aspiration.  Has completed  Zosyn.  Continue Pulmicort, Roxy Manns Currently on 3 L of oxygen by nasal cannula.   Essential Hypertension:  on amlodipine and Lopressor, Cardura as outpatient.  Had hypotension which has resolved at this time.  Off pressors.  Hypothyroidism: Continue Synthroid.   Hypokalemia: Improved after repletion.  Latest potassium of 4.3.  Recent history of Non-STEMI:  Seen by cardiology in previous admission and was on heparin drip and was subsequently started on metoprolol.  Pulmonary nodule:  1.6 cm superior segment of the left lower lobe.  Follows up with Dr. Delton Coombes, pulmonary as outpatient.   Plan was to repeat another CT chest in 6 months    Chronic HFpEF   2D echocardiogram from 4/23 with LV ejection fraction of 55 to 60% with diastolic dysfunction.  Diuretics at this time.     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 Continue crestor   PAD: Extensive PAD history with renal artery stenosis status post bilateral renal artery stenting, carotid disease status post right CEA, mild lower extremity arterial disease treated medically.  Continue aspirin and statins.   Debility deconditioning.  Patient has been seen by physical therapy and recommend skilled nursing facility.  Goals of care.  Palliative care on board.  Patient is DNR.  Overall prognosis of the patient is poor.  Palliative care is having plans for family discussion tomorrow.  I have strongly encouraged the patient's sister to come up with a good plan for the future.  DVT prophylaxis: Place and maintain sequential compression device Start: 08/11/23 1647 SCDs Start: 08/08/23  1021   Disposition: Skilled nursing facility, ongoing palliative care discussion, need goals of care discussion.  Status is: Inpatient  Remains inpatient appropriate because: Pending clinical improvement, need for skilled nursing facility, ongoing need for goals of care discussion.    Code  Status:     Code Status: Do not attempt resuscitation (DNR) - Comfort care  Family Communication: Spoke with the patient's sister on the phone and updated her about the clinical condition of the patient.  Discussed about goals of care.  Consultants: Palliative care Neurology GI  Procedures: Long-term EEG PRBC transfusion 1 unit Intubation, mechanical ventilation and extubation.  Anti-infectives:  None currently  Anti-infectives (From admission, onward)    Start     Dose/Rate Route Frequency Ordered Stop   08/08/23 1800  piperacillin-tazobactam (ZOSYN) IVPB 3.375 g        3.375 g 12.5 mL/hr over 240 Minutes Intravenous Every 8 hours 08/08/23 1042 08/12/23 2359   08/08/23 1045  piperacillin-tazobactam (ZOSYN) IVPB 3.375 g        3.375 g 100 mL/hr over 30 Minutes Intravenous  Once 08/08/23 1042 08/08/23 1314       Subjective:  Today, patient was seen and examined at bedside.  Nursing staff reported some black stool.  Hemoglobin low.  Patient denies any shortness of breath, dyspnea, chest pain, nausea, vomiting, fever or chills or rigor.  Spoke with the patient's sister on the phone about goals of care.  Objective: Vitals:   08/14/23 0748 08/14/23 0811  BP: (!) 151/60   Pulse: 67   Resp: 17   Temp: (!) 97.5 F (36.4 C)   SpO2: 100% 100%    Intake/Output Summary (Last 24 hours) at 08/14/2023 1056 Last data filed at 08/13/2023 1700 Gross per 24 hour  Intake 240 ml  Output --  Net 240 ml   Filed Weights   08/11/23 0500 08/12/23 0700 08/13/23 0703  Weight: 39.7 kg 39.5 kg 39.9 kg   Body mass index is 17.18 kg/m.   Physical Exam:  GENERAL: Thinly built, awake and Communicative, not in obvious distress.  Off mittens today.  Appears chronically ill and frail. HENT: Pallor noted.  Pupils equally reactive to light. Oral mucosa is moist, on nasal cannula oxygen NECK: is supple, no gross swelling noted. CHEST: .  Diminished breath sounds bilaterally. CVS: S1 and S2  heard, no murmur. Regular rate and rhythm.  ABDOMEN: Soft, non-tender, bowel sounds are present.  External urinary catheter in place. EXTREMITIES: No edema.  Thin extremities. CNS: Cranial nerves are intact. No focal motor deficits.  Oriented able to tell where she and less confused today. SKIN: warm and dry without rashes.  Data Review: I have personally reviewed the following laboratory data and studies,  CBC: Recent Labs  Lab 08/10/23 0220 08/11/23 0601 08/12/23 0542 08/13/23 0548 08/14/23 0704  WBC 14.5* 10.2 11.7* 11.4* 10.1  NEUTROABS  --   --   --   --  7.0  HGB 6.5* 8.4* 8.3* 7.5* 7.0*  HCT 21.3* 28.0* 27.6* 26.1* 23.9*  MCV 89.9 90.3 91.4 92.9 93.4  PLT 285 267 275 289 273   Basic Metabolic Panel: Recent Labs  Lab 08/09/23 0813 08/10/23 0220 08/11/23 0601 08/12/23 0542 08/13/23 0548 08/14/23 0704  NA 142 137 142 142 145 143  K 4.2 3.8 3.6 3.1* 4.3 4.0  CL 91* 86* 91* 94* 99 100  CO2 39* 40* 43* 42* 37* 35*  GLUCOSE 73 112* 102* 89 103* 102*  BUN 16 22  14 14 16 17   CREATININE 1.14* 0.94 0.89 0.79 0.85 0.74  CALCIUM 8.6* 8.6* 8.6* 8.6* 8.9 8.9  MG 1.9 2.6* 2.1 2.0 2.0 2.1  PHOS 3.5 4.2 3.3 2.5 2.8  --    Liver Function Tests: Recent Labs  Lab 08/08/23 0922  AST 36  ALT 39  ALKPHOS 69  BILITOT 0.3  PROT 5.5*  ALBUMIN 2.4*   No results for input(s): "LIPASE", "AMYLASE" in the last 168 hours. No results for input(s): "AMMONIA" in the last 168 hours. Cardiac Enzymes: Recent Labs  Lab 08/08/23 1022  CKTOTAL 30*   BNP (last 3 results) Recent Labs    07/11/23 0841 08/08/23 0922  BNP 1,494.1* 582.8*    ProBNP (last 3 results) No results for input(s): "PROBNP" in the last 8760 hours.  CBG: Recent Labs  Lab 08/08/23 1102 08/08/23 1521 08/08/23 2023 08/09/23 1131 08/10/23 1532  GLUCAP 92 94 96 126* 104*   Recent Results (from the past 240 hours)  Blood culture (routine x 2)     Status: None   Collection Time: 08/08/23 10:47 AM    Specimen: BLOOD LEFT ARM  Result Value Ref Range Status   Specimen Description BLOOD LEFT ARM  Final   Special Requests   Final    BOTTLES DRAWN AEROBIC AND ANAEROBIC Blood Culture results may not be optimal due to an inadequate volume of blood received in culture bottles   Culture   Final    NO GROWTH 5 DAYS Performed at [facility], [address], [place], [place] [address]    Report Status 08/13/2023 FINAL  Final  Resp panel by RT-PCR (RSV, Flu A&B, Covid) Anterior Nasal Swab     Status: None   Collection Time: 08/08/23 10:48 AM   Specimen: Anterior Nasal Swab  Result Value Ref Range Status   SARS Coronavirus 2 by RT PCR NEGATIVE NEGATIVE Final   Influenza A by PCR NEGATIVE NEGATIVE Final   Influenza B by PCR NEGATIVE NEGATIVE Final    Comment: (NOTE) The Xpert Xpress SARS-CoV-2/FLU/RSV plus assay is intended as an aid in the diagnosis of influenza from Nasopharyngeal swab specimens and should not be used as a sole basis for treatment. Nasal washings and aspirates are unacceptable for Xpert Xpress SARS-CoV-2/FLU/RSV testing.  Fact Sheet for Patients: BloggerCourse.com  Fact Sheet for Healthcare Providers: SeriousBroker.it  This test is not yet approved or cleared by the [place] FDA and has been authorized for detection and/or diagnosis of SARS-CoV-2 by FDA under an Emergency Use Authorization (EUA). This EUA will remain in effect (meaning this test can be used) for the duration of the COVID-19 declaration under Section 564(b)(1) of the Act, 21 U.S.C. section 360bbb-3(b)(1), unless the authorization is terminated or revoked.     Resp Syncytial Virus by PCR NEGATIVE NEGATIVE Final    Comment: (NOTE) Fact Sheet for Patients: BloggerCourse.com  Fact Sheet for Healthcare Providers: SeriousBroker.it  This test is not yet approved or cleared by the [place]  FDA and has been authorized for detection and/or diagnosis of SARS-CoV-2 by FDA under an Emergency Use Authorization (EUA). This EUA will remain in effect (meaning this test can be used) for the duration of the COVID-19 declaration under Section 564(b)(1) of the Act, 21 U.S.C. section 360bbb-3(b)(1), unless the authorization is terminated or revoked.  Performed at [facility], [address], [place], [place] [address]   Blood culture (routine x 2)     Status: None   Collection  Time: 08/08/23 12:10 PM   Specimen: BLOOD  Result Value Ref Range Status   Specimen Description BLOOD SITE NOT SPECIFIED  Final   Special Requests   Final    BOTTLES DRAWN AEROBIC AND ANAEROBIC Blood Culture results may not be optimal due to an inadequate volume of blood received in culture bottles   Culture   Final    NO GROWTH 5 DAYS Performed at [facility], [address], [place], [place] [address]    Report Status 08/13/2023 FINAL  Final  MRSA Next Gen by PCR, Nasal     Status: None   Collection Time: 08/08/23 12:48 PM   Specimen: Nasal Mucosa; Nasal Swab  Result Value Ref Range Status   MRSA by PCR Next Gen NOT DETECTED NOT DETECTED Final    Comment: (NOTE) The GeneXpert MRSA Assay (FDA approved for NASAL specimens only), is one component of a comprehensive MRSA colonization surveillance program. It is not intended to diagnose MRSA infection nor to guide or monitor treatment for MRSA infections. Test performance is not FDA approved in patients less than 108 years old. Performed at [facility], [address] [place], [place] [address]      Studies: No results found.    Joycelyn Das, MD  [facility] 08/14/2023  If 7PM-7AM, please contact night-coverage

## 2023-08-14 NOTE — Plan of Care (Signed)

## 2023-08-14 NOTE — Plan of Care (Signed)
  Problem: Education: Goal: Knowledge of General Education information will improve Description: Including pain rating scale, medication(s)/side effects and non-pharmacologic comfort measures 08/14/2023 0028 by Tennis Ship, RN Outcome: Progressing 08/14/2023 0027 by Tennis Ship, RN Outcome: Progressing   Problem: Health Behavior/Discharge Planning: Goal: Ability to manage health-related needs will improve 08/14/2023 0028 by Tennis Ship, RN Outcome: Progressing 08/14/2023 0027 by Tennis Ship, RN Outcome: Progressing   Problem: Clinical Measurements: Goal: Ability to maintain clinical measurements within normal limits will improve 08/14/2023 0028 by Tennis Ship, RN Outcome: Progressing 08/14/2023 0027 by Tennis Ship, RN Outcome: Progressing Goal: Will remain free from infection 08/14/2023 0028 by Tennis Ship, RN Outcome: Progressing 08/14/2023 0027 by Tennis Ship, RN Outcome: Progressing Goal: Diagnostic test results will improve 08/14/2023 0028 by Tennis Ship, RN Outcome: Progressing 08/14/2023 0027 by Tennis Ship, RN Outcome: Progressing Goal: Respiratory complications will improve 08/14/2023 0028 by Tennis Ship, RN Outcome: Progressing 08/14/2023 0027 by Tennis Ship, RN Outcome: Progressing Goal: Cardiovascular complication will be avoided 08/14/2023 0028 by Tennis Ship, RN Outcome: Progressing 08/14/2023 0027 by Tennis Ship, RN Outcome: Progressing   Problem: Activity: Goal: Risk for activity intolerance will decrease 08/14/2023 0028 by Tennis Ship, RN Outcome: Progressing 08/14/2023 0027 by Tennis Ship, RN Outcome: Progressing   Problem: Nutrition: Goal: Adequate nutrition will be maintained 08/14/2023 0028 by Tennis Ship, RN Outcome: Progressing 08/14/2023 0027 by Tennis Ship, RN Outcome: Progressing   Problem: Coping: Goal: Level of anxiety will  decrease 08/14/2023 0028 by Tennis Ship, RN Outcome: Progressing 08/14/2023 0027 by Tennis Ship, RN Outcome: Progressing   Problem: Elimination: Goal: Will not experience complications related to bowel motility 08/14/2023 0028 by Tennis Ship, RN Outcome: Progressing 08/14/2023 0027 by Tennis Ship, RN Outcome: Progressing Goal: Will not experience complications related to urinary retention 08/14/2023 0028 by Tennis Ship, RN Outcome: Progressing 08/14/2023 0027 by Tennis Ship, RN Outcome: Progressing   Problem: Pain Managment: Goal: General experience of comfort will improve and/or be controlled 08/14/2023 0028 by Tennis Ship, RN Outcome: Progressing 08/14/2023 0027 by Tennis Ship, RN Outcome: Progressing   Problem: Safety: Goal: Ability to remain free from injury will improve 08/14/2023 0028 by Tennis Ship, RN Outcome: Progressing 08/14/2023 0027 by Tennis Ship, RN Outcome: Progressing   Problem: Skin Integrity: Goal: Risk for impaired skin integrity will decrease 08/14/2023 0028 by Tennis Ship, RN Outcome: Progressing 08/14/2023 0027 by Tennis Ship, RN Outcome: Progressing   Problem: Safety: Goal: Non-violent Restraint(s) 08/14/2023 0028 by Tennis Ship, RN Outcome: Progressing 08/14/2023 0027 by Tennis Ship, RN Outcome: Progressing

## 2023-08-15 DIAGNOSIS — R410 Disorientation, unspecified: Secondary | ICD-10-CM | POA: Diagnosis not present

## 2023-08-15 LAB — TYPE AND SCREEN
ABO/RH(D): O NEG
Antibody Screen: NEGATIVE
Unit division: 0

## 2023-08-15 LAB — BASIC METABOLIC PANEL
Anion gap: 7 (ref 5–15)
BUN: 20 mg/dL (ref 8–23)
CO2: 34 mmol/L — ABNORMAL HIGH (ref 22–32)
Calcium: 8.8 mg/dL — ABNORMAL LOW (ref 8.9–10.3)
Chloride: 104 mmol/L (ref 98–111)
Creatinine, Ser: 0.98 mg/dL (ref 0.44–1.00)
GFR, Estimated: 59 mL/min — ABNORMAL LOW (ref >60)
Glucose, Bld: 96 mg/dL (ref 70–99)
Potassium: 4 mmol/L (ref 3.5–5.1)
Sodium: 145 mmol/L (ref 135–145)

## 2023-08-15 LAB — BPAM RBC
Blood Product Expiration Date: 202502142359
ISSUE DATE / TIME: 202501231145
Unit Type and Rh: 9500

## 2023-08-15 LAB — HEMOGLOBIN AND HEMATOCRIT, BLOOD
HCT: 28 % — ABNORMAL LOW (ref 36.0–46.0)
Hemoglobin: 8.5 g/dL — ABNORMAL LOW (ref 12.0–15.0)

## 2023-08-15 NOTE — Progress Notes (Signed)
Nutrition Follow-up  DOCUMENTATION CODES:   Severe malnutrition in context of social or environmental circumstances, Underweight  INTERVENTION:   Chart reviewed. Pt now transitioning to comfort care.  No further nutrition interventions planned at this time.  Please re-consult as needed.    - continue with DYS # diet - Ensure Enlive po BID, each supplement provides 350 kcal and 20 grams of protein   - Magic Cup BID with lunch and dinner meals, each supplement provides 290 kcal and 9 grams of protein   - MVI with minerals daily   NUTRITION DIAGNOSIS:   Severe Malnutrition related to chronic illness as evidenced by severe muscle depletion, severe fat depletion.    GOAL:   Patient will meet greater than or equal to 90% of their needs    MONITOR:   PO intake  REASON FOR ASSESSMENT:   Consult Assessment of nutrition requirement/status  ASSESSMENT:   78 year old female who presented to the ED from SNF on 1/17 with respiratory distress. PMH of COPD, bilateral lung cancer with prior SBRT, FTT, severe malnutrition, prior seizures, prior L occipital lobe hemorrhagic infarcts. Pt admitted with AMS, acute on chronic hypoxemic and hypercarbic respiratory failure. Patient setting up in bed with breakfast on bedside table which was pushed up against the wall.  Patient stated that she had just finished with walking down hall. But was confused when asked if she used the walker. She would was getting confused when asked questions.  Poor historian as to nutritional history.  MD came while RD in room he was asking her some questions and she got upset with him.  RD stayed and finished assessment after MD left. RN reports that she requires assistance with meals. Comfort.  Weight history: 08/15/23 36.7 kg  08/07/23 39.4 kg  07/11/23 39.9 kg  05/20/23 36.7 kg  11/18/22 37 kg   Hospital weight history: Date/Time Weight Weight in lbs  08/15/23 0500 36.7 kg 80.91 lbs  08/13/23 0703 39.9  kg 87.96 lbs  08/12/23 0700 39.5 kg 87.08 lbs  08/11/23 0500 39.7 kg 87.52 lbs  08/10/23 0500 39.5 kg 87.08 lbs  08/08/23 1059 40 kg 88.18 lbs  08/08/23 0938 40 kg 88.18 lbs      Average Meal Intake: 30-100: 45% intake x 6 recorded meals  Nutritionally Relevant Medications: Scheduled Meds:  docusate sodium  100 mg Oral BID   feeding supplement  237 mL Oral BID BM   multivitamin with minerals  1 tablet Oral Daily   topiramate  50 mg Oral BID    Labs Reviewed    NUTRITION - FOCUSED PHYSICAL EXAM:  Flowsheet Row Most Recent Value  Orbital Region Severe depletion  Upper Arm Region Severe depletion  Thoracic and Lumbar Region Severe depletion  Buccal Region Severe depletion  Temple Region Severe depletion  Clavicle Bone Region Severe depletion  Clavicle and Acromion Bone Region Severe depletion  Scapular Bone Region Severe depletion  Dorsal Hand Severe depletion  Patellar Region Severe depletion  Anterior Thigh Region Severe depletion  Posterior Calf Region Severe depletion  Edema (RD Assessment) None  Hair Reviewed  Eyes Reviewed  Mouth Reviewed  Skin Reviewed  Nails Reviewed       Diet Order:   Diet Order             DIET DYS 3 Room service appropriate? Yes; Fluid consistency: Thin  Diet effective now                   EDUCATION NEEDS:  Not appropriate for education at this time  Skin:  Skin Assessment: Reviewed RN Assessment  Last BM:  08/14/23 type 6  Height:   Ht Readings from Last 1 Encounters:  08/08/23 5' (1.524 m)    Weight:   Wt Readings from Last 1 Encounters:  08/15/23 36.7 kg    Ideal Body Weight:     BMI:  Body mass index is 15.8 kg/m.  Estimated Nutritional Needs:   Kcal:  1400-1600  Protein:  60-75 grams  Fluid:  >1.4 L    Jamelle Haring RDN, LDN Clinical Dietitian   If unable to reach, please contact "RD Inpatient" secure chat group between 8 am-4 pm daily"

## 2023-08-15 NOTE — Plan of Care (Signed)

## 2023-08-15 NOTE — Progress Notes (Signed)
Physical Therapy Treatment Patient Details Name: Kendra Watts MRN: [id number] DOB: Nov 28, 1945 Today's Date: 08/15/2023   History of Present Illness Pt is a 78 y.o. female who presented 08/08/23 after being found unresponsive in respiratory distress. Pt bagged enroute and intubated in ED and extubated same day. Concern noted for possible absence seizures. Rapid response called for episode of unresponsiveness 1/17. PMH: breast CA, COPD on chronic home 4L O2, CHF, HTN, L hip fx, L occipital lobe hemorrhagic infarcts, anemia, HLD    PT Comments  Pt demonstrates a continued gradual improvement in activity tolerance, ambulating for increased distances today. Pt has poor walker management, bumping into many objects in the hallway with poor ability to plan path to avoid hitting objects and poor ability to move walker around objects after striking them. Patient will benefit from continued inpatient follow up therapy, <3 hours/day.    If plan is discharge home, recommend the following: Assistance with cooking/housework;Assist for transportation;Help with stairs or ramp for entrance;Direct supervision/assist for medications management;Direct supervision/assist for financial management;A little help with walking and/or transfers;A lot of help with bathing/dressing/bathroom   Can travel by private vehicle     Yes  Equipment Recommendations  Rolling walker (2 wheels);BSC/3in1    Recommendations for Other Services       Precautions / Restrictions Precautions Precautions: Fall Precaution Comments: monitor O2 (supplemental O2 at baseline) Restrictions Weight Bearing Restrictions Per Provider Order: No     Mobility  Bed Mobility Overal bed mobility: Needs Assistance Bed Mobility: Supine to Sit, Sit to Supine     Supine to sit: Contact guard, HOB elevated Sit to supine: Min assist        Transfers Overall transfer level: Needs assistance Equipment used: Rolling walker (2 wheels) Transfers:  Sit to/from Stand Sit to Stand: Contact guard assist                Ambulation/Gait Ambulation/Gait assistance: Contact guard assist Gait Distance (Feet): 150 Feet Assistive device: Rolling walker (2 wheels) Gait Pattern/deviations: Step-through pattern Gait velocity: reduced Gait velocity interpretation: <1.8 ft/sec, indicate of risk for recurrent falls   General Gait Details: slowed step-through gait, pt consistently bumping RW into objects on R side. Has poor ability to correct path of gait when approaching objects as well as poor planning to navigate walker around objects after striking them   Stairs             Wheelchair Mobility     Tilt Bed    Modified Rankin (Stroke Patients Only)       Balance Overall balance assessment: Needs assistance Sitting-balance support: No upper extremity supported, Feet supported Sitting balance-Leahy Scale: Good     Standing balance support: Bilateral upper extremity supported, Reliant on assistive device for balance Standing balance-Leahy Scale: Poor                              Cognition Arousal: Alert Behavior During Therapy: WFL for tasks assessed/performed Overall Cognitive Status: Impaired/Different from baseline Area of Impairment: Orientation, Memory, Safety/judgement, Awareness, Problem solving                 Orientation Level: Disoriented to, Time Current Attention Level: Sustained Memory: Decreased short-term memory Following Commands: Follows one step commands consistently, Follows multi-step commands consistently Safety/Judgement: Decreased awareness of safety, Decreased awareness of deficits Awareness: Emergent Problem Solving: Slow processing, Difficulty sequencing          Exercises  General Comments General comments (skin integrity, edema, etc.): VSS on 3L [place]      Pertinent Vitals/Pain Pain Assessment Pain Assessment: No/denies pain    Home Living                           Prior Function            PT Goals (current goals can now be found in the care plan section) Acute Rehab PT Goals Patient Stated Goal: to get stronger Progress towards PT goals: Progressing toward goals    Frequency    Min 1X/week      PT Plan      Co-evaluation              AM-PAC PT "6 Clicks" Mobility   Outcome Measure  Help needed turning from your back to your side while in a flat bed without using bedrails?: A Little Help needed moving from lying on your back to sitting on the side of a flat bed without using bedrails?: A Little Help needed moving to and from a bed to a chair (including a wheelchair)?: A Little Help needed standing up from a chair using your arms (e.g., wheelchair or bedside chair)?: A Little Help needed to walk in hospital room?: A Little Help needed climbing 3-5 steps with a railing? : Total 6 Click Score: 16    End of Session Equipment Utilized During Treatment: Oxygen;Gait belt Activity Tolerance: Patient tolerated treatment well Patient left: in bed;with call bell/phone within reach;with bed alarm set Nurse Communication: Mobility status PT Visit Diagnosis: Muscle weakness (generalized) (M62.81);Unsteadiness on feet (R26.81);Other abnormalities of gait and mobility (R26.89);Difficulty in walking, not elsewhere classified (R26.2)     Time: 4098-1191 PT Time Calculation (min) (ACUTE ONLY): 24 min  Charges:    $Gait Training: 8-22 mins $Therapeutic Activity: 8-22 mins PT General Charges $$ ACUTE PT VISIT: 1 Visit                     Arlyss Gandy, PT, DPT Acute Rehabilitation Office [phone]    Arlyss Gandy 08/15/2023, 12:58 PM

## 2023-08-15 NOTE — Progress Notes (Signed)
Mobility Specialist: Progress Note   08/15/23 1153  Mobility  Activity Ambulated with assistance in hallway  Level of Assistance Contact guard assist, steadying assist  Assistive Device Front wheel walker  Distance Ambulated (ft) 300 ft  Activity Response Tolerated well  Mobility Referral Yes  Mobility visit 1 Mobility  Mobility Specialist Start Time (ACUTE ONLY) [id number]  Mobility Specialist Stop Time (ACUTE ONLY) 0957  Mobility Specialist Time Calculation (min) (ACUTE ONLY) 35 min    Pre Mobility: SpO2 58% RA, 91-93% 3LO2 During Mobility: SpO2 90-96% 3LO2 Post Mobility: SpO2 91% 3LO2  Pt was agreeable to mobility session - received in bed. No complaints. Received off [place] - SpO2 58% RA. Titrated to 3LO2 and sat to SpO2 91-93%. SpO2 maintained >90% on 3LO2. Attempted to taper O2 down throughout but pt would desat after a few feet of ambulation. Returned to room without fault. Left in bed on 3LO2, with all needs met, call bell in reach. SpO2 91% 3LO2.   Maurene Capes Mobility Specialist Please contact via SecureChat or Rehab office at [phone]

## 2023-08-15 NOTE — TOC Progression Note (Addendum)
Transition of Care [facility]) - Progression Note    Patient Details  Name: Kendra Watts MRN: [id number] Date of Birth: July 09, 1946  [facility]) CM/SW Contact  Shaunice Levitan A [place], [place] Phone Number: 08/15/2023, 2:34 PM  Clinical Narrative:     CSW spoke with pt's sister, [name], to discuss questions regarding palliative care outpatient at discharge. CSW informed her that pt can go to facility with palliative care and continue with rehab at facility she arrived from, [facility]. CSW has confirmed with Bjorn Loser at facility that pt can return when stable.   She stated that she would be ok with referral for [employer], CSW to reach out and place referral to agency. She provided permission to have her contact information shared with agency to follow up closer to discharge.   CSW provided contact information to reach back out to CSW with any questions.    TOC will continue to follow.    Expected Discharge Plan: Skilled Nursing Facility Barriers to Discharge: Continued Medical Work up  Expected Discharge Plan and Services In-house Referral: Clinical Social Work   Post Acute Care Choice: Skilled Nursing Facility Living arrangements for the past 2 months: Apartment, Skilled Nursing Facility                                       Social Determinants of Health (SDOH) Interventions SDOH Screenings   Food Insecurity: No Food Insecurity (07/12/2023)  Housing: Low Risk  (07/12/2023)  Transportation Needs: No Transportation Needs (07/12/2023)  Utilities: Not At Risk (07/12/2023)  Alcohol Screen: Low Risk  (06/20/2020)  Depression (PHQ2-9): Low Risk  (06/20/2020)  Financial Resource Strain: Low Risk  (06/20/2020)  Physical Activity: Insufficiently Active (06/20/2020)  Social Connections: Moderately Isolated (06/20/2020)  Stress: No Stress Concern Present (06/20/2020)  Tobacco Use: High Risk (08/08/2023)    Readmission Risk Interventions    07/14/2023   12:45  PM  Readmission Risk Prevention Plan  Transportation Screening Complete  PCP or Specialist Appt within 5-7 Days Complete  Home Care Screening Complete  Medication Review (RN CM) Complete

## 2023-08-15 NOTE — Progress Notes (Signed)
[facility] 2W04 [facility] Liaison Note   Notified by [facility] of patient request for [employer] Palliative services at [facility] after discharge.   Referral received.   Please call with any questions.   Thank you, Henderson Newcomer, [facility] Liaison [phone]

## 2023-08-15 NOTE — Progress Notes (Signed)
PROGRESS NOTE  FAYLYNN STAMOS VWU:[id number] DOB: 28-Jan-1946 DOA: 08/08/2023 PCP: Georgianne Fick, MD   LOS: 7 days   Brief narrative:  78 year old female with past medical history of O2 dependent COPD,  lung cancer with prior SBRT, failure to thrive, severe protein calorie malnutrition, prior seizures, peripheral artery disease, chronic heart failure with preserved ejection fraction, hypertension, hypothyroidism presented presented to hospital after being found obtunded at the skilled nursing facility.  Patient was intubated in the ED and was admitted in ICU.  Subsequently, she was extubated and transferred to the medical floor.  On the floor she was noted to have episodes of staring into the space along with getting mute.  Neurology was consulted.  She was put on long-term EEG on 1/17 which was negative.  Given her prior left occipital hemorrhage she was considered to be high risk for seizures so was given load of valproic acid and Keppra.  Assessment/Plan: Principal Problem:   AMS (altered mental status)   Acute metabolic encephalopathy/history of seizures, delirium. Neurology was consulted.  Long-term EEG monitoring was done which was negative so discontinued.  MRI of the brain was negative for acute findings..  On Keppra.  Haldol as needed.  Neurology has signed off.  Off restrains.   Acute on chronic anemia with melena During hospitalization, hemoglobin dropped to 6.5 and required 1 unit of PRBC. FOBT was positive.  GI was notified.  Hemoglobin of 7.0  from 7.5.  Hold off with aspirin for now due to ongoing melena.  Hemoglobin of 7.0 on 08/14/23 so  transfused 1 unit of packed RBC.  Hemoglobin today at 8.5.  Seen by GI and has been signed off since patient is not a good candidate for aggressive  intervention.  Recommendation is to continue Protonix.  No mention of melena overnight.  Acute on chronic hypoxemic and hypercarbic respiratory failure History of oxygen dependent COPD,  question aspiration.  Has completed Zosyn.  Continue Pulmicort, Roxy Manns Currently on 2 L of oxygen by nasal cannula.   Essential Hypertension:  on amlodipine and Lopressor, Cardura as outpatient.  Had hypotension which has resolved at this time.  Off pressors.  Hypothyroidism: Continue Synthroid.   Hypokalemia: Improved after repletion.  Latest potassium of 4.3.  Recent history of Non-STEMI:  Seen by cardiology in previous admission and was on heparin drip and was subsequently started on metoprolol.  Pulmonary nodule:  1.6 cm superior segment of the left lower lobe.  Follows up with Dr. Delton Coombes, pulmonary as outpatient.   Plan was to repeat another CT chest in 6 months    Chronic HFpEF   2D echocardiogram from 4/23 with LV ejection fraction of 55 to 60% with diastolic dysfunction.  Not on diuretics at this time.     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 Continue crestor   PAD: Extensive PAD history with renal artery stenosis status post bilateral renal artery stenting, carotid disease status post right CEA, mild lower extremity arterial disease treated medically.  Continue aspirin and statins.   Debility deconditioning.  Patient has been seen by physical therapy and recommend skilled nursing facility.  Goals of care.  Palliative care on board.  Patient is DNR.  Overall prognosis of the patient is poor.  Palliative care is having plans for family meeting and goals of care discussion tomorrow.  I have strongly encouraged the patient's sister to come up with a good plan for the future.  DVT prophylaxis: Place  and maintain sequential compression device Start: 08/11/23 1647 SCDs Start: 08/08/23 1021   Disposition: Skilled nursing facility, ongoing palliative care discussion, need goals of care discussion.  Status is: Inpatient  Remains inpatient appropriate because: Pending clinical improvement, need for skilled  nursing facility, ongoing need for goals of care discussion.    Code Status:     Code Status: Do not attempt resuscitation (DNR) - Comfort care  Family Communication: Spoke with the patient's sister on the phone on 08/14/2023   consultants: Palliative care Neurology GI  Procedures: Long-term EEG PRBC transfusion 2 unit Intubation, mechanical ventilation and extubation.  Anti-infectives:  None currently  Anti-infectives (From admission, onward)    Start     Dose/Rate Route Frequency Ordered Stop   08/08/23 1800  piperacillin-tazobactam (ZOSYN) IVPB 3.375 g        3.375 g 12.5 mL/hr over 240 Minutes Intravenous Every 8 hours 08/08/23 1042 08/12/23 2359   08/08/23 1045  piperacillin-tazobactam (ZOSYN) IVPB 3.375 g        3.375 g 100 mL/hr over 30 Minutes Intravenous  Once 08/08/23 1042 08/08/23 1314       Subjective:  Today, patient was seen and examined at bedside.  Patient denies any pain, nausea, vomiting, fever, chills or rigor.  Does not wish to answer orientation questions.  Had bowel movement but no mention of melena.  Objective: Vitals:   08/15/23 0800 08/15/23 0901  BP:  (!) 146/58  Pulse:  67  Resp:    Temp:    SpO2: 100%     Intake/Output Summary (Last 24 hours) at 08/15/2023 1014 Last data filed at 08/14/2023 1545 Gross per 24 hour  Intake 689.92 ml  Output --  Net 689.92 ml   Filed Weights   08/12/23 0700 08/13/23 0703 08/15/23 0500  Weight: 39.5 kg 39.9 kg 36.7 kg   Body mass index is 15.8 kg/m.   Physical Exam:  GENERAL: Thinly built, awake and Communicative, not in obvious distress.   Appears chronically ill and frail. HENT: Pallor noted.  Pupils equally reactive to light. Oral mucosa is moist, on nasal cannula oxygen NECK: is supple, no gross swelling noted. CHEST: .  Diminished breath sounds bilaterally. CVS: S1 and S2 heard, no murmur. Regular rate and rhythm.  ABDOMEN: Soft, non-tender, bowel sounds are present.  External urinary  catheter in place. EXTREMITIES: No edema.  Thin extremities. CNS: Cranial nerves are intact. No focal motor deficits.    Upset when asked about orientation question. SKIN: warm and dry without rashes.  Data Review: I have personally reviewed the following laboratory data and studies,  CBC: Recent Labs  Lab 08/10/23 0220 08/11/23 0601 08/12/23 0542 08/13/23 0548 08/14/23 0704 08/14/23 1756 08/15/23 0648  WBC 14.5* 10.2 11.7* 11.4* 10.1  --   --   NEUTROABS  --   --   --   --  7.0  --   --   HGB 6.5* 8.4* 8.3* 7.5* 7.0* 9.0* 8.5*  HCT 21.3* 28.0* 27.6* 26.1* 23.9* 29.5* 28.0*  MCV 89.9 90.3 91.4 92.9 93.4  --   --   PLT 285 267 275 289 273  --   --    Basic Metabolic Panel: Recent Labs  Lab 08/09/23 0813 08/10/23 0220 08/11/23 0601 08/12/23 0542 08/13/23 0548 08/14/23 0704 08/15/23 0648  NA 142 137 142 142 145 143 145  K 4.2 3.8 3.6 3.1* 4.3 4.0 4.0  CL 91* 86* 91* 94* 99 100 104  CO2 39* 40* 43* 42*  37* 35* 34*  GLUCOSE 73 112* 102* 89 103* 102* 96  BUN 16 22 14 14 16 17 20   CREATININE 1.14* 0.94 0.89 0.79 0.85 0.74 0.98  CALCIUM 8.6* 8.6* 8.6* 8.6* 8.9 8.9 8.8*  MG 1.9 2.6* 2.1 2.0 2.0 2.1  --   PHOS 3.5 4.2 3.3 2.5 2.8  --   --    Liver Function Tests: No results for input(s): "AST", "ALT", "ALKPHOS", "BILITOT", "PROT", "ALBUMIN" in the last 168 hours.  No results for input(s): "LIPASE", "AMYLASE" in the last 168 hours. No results for input(s): "AMMONIA" in the last 168 hours. Cardiac Enzymes: Recent Labs  Lab 08/08/23 1022  CKTOTAL 30*   BNP (last 3 results) Recent Labs    07/11/23 0841 08/08/23 0922  BNP 1,494.1* 582.8*    ProBNP (last 3 results) No results for input(s): "PROBNP" in the last 8760 hours.  CBG: Recent Labs  Lab 08/08/23 1102 08/08/23 1521 08/08/23 2023 08/09/23 1131 08/10/23 1532  GLUCAP 92 94 96 126* 104*   Recent Results (from the past 240 hours)  Blood culture (routine x 2)     Status: None   Collection Time: 08/08/23  10:47 AM   Specimen: BLOOD LEFT ARM  Result Value Ref Range Status   Specimen Description BLOOD LEFT ARM  Final   Special Requests   Final    BOTTLES DRAWN AEROBIC AND ANAEROBIC Blood Culture results may not be optimal due to an inadequate volume of blood received in culture bottles   Culture   Final    NO GROWTH 5 DAYS Performed at [facility], [address] [place], [place] [address]    Report Status 08/13/2023 FINAL  Final  Resp panel by RT-PCR (RSV, Flu A&B, Covid) Anterior Nasal Swab     Status: None   Collection Time: 08/08/23 10:48 AM   Specimen: Anterior Nasal Swab  Result Value Ref Range Status   SARS Coronavirus 2 by RT PCR NEGATIVE NEGATIVE Final   Influenza A by PCR NEGATIVE NEGATIVE Final   Influenza B by PCR NEGATIVE NEGATIVE Final    Comment: (NOTE) The Xpert Xpress SARS-CoV-2/FLU/RSV plus assay is intended as an aid in the diagnosis of influenza from Nasopharyngeal swab specimens and should not be used as a sole basis for treatment. Nasal washings and aspirates are unacceptable for Xpert Xpress SARS-CoV-2/FLU/RSV testing.  Fact Sheet for Patients: BloggerCourse.com  Fact Sheet for Healthcare Providers: SeriousBroker.it  This test is not yet approved or cleared by the [place] FDA and has been authorized for detection and/or diagnosis of SARS-CoV-2 by FDA under an Emergency Use Authorization (EUA). This EUA will remain in effect (meaning this test can be used) for the duration of the COVID-19 declaration under Section 564(b)(1) of the Act, 21 U.S.C. section 360bbb-3(b)(1), unless the authorization is terminated or revoked.     Resp Syncytial Virus by PCR NEGATIVE NEGATIVE Final    Comment: (NOTE) Fact Sheet for Patients: BloggerCourse.com  Fact Sheet for Healthcare Providers: SeriousBroker.it  This test is not yet approved or cleared by the  [place] FDA and has been authorized for detection and/or diagnosis of SARS-CoV-2 by FDA under an Emergency Use Authorization (EUA). This EUA will remain in effect (meaning this test can be used) for the duration of the COVID-19 declaration under Section 564(b)(1) of the Act, 21 U.S.C. section 360bbb-3(b)(1), unless the authorization is terminated or revoked.  Performed at [facility], [address], [place], [place] [address]  Blood culture (routine x 2)     Status: None   Collection Time: 08/08/23 12:10 PM   Specimen: BLOOD  Result Value Ref Range Status   Specimen Description BLOOD SITE NOT SPECIFIED  Final   Special Requests   Final    BOTTLES DRAWN AEROBIC AND ANAEROBIC Blood Culture results may not be optimal due to an inadequate volume of blood received in culture bottles   Culture   Final    NO GROWTH 5 DAYS Performed at [facility], [address] [place], [place] [address]    Report Status 08/13/2023 FINAL  Final  MRSA Next Gen by PCR, Nasal     Status: None   Collection Time: 08/08/23 12:48 PM   Specimen: Nasal Mucosa; Nasal Swab  Result Value Ref Range Status   MRSA by PCR Next Gen NOT DETECTED NOT DETECTED Final    Comment: (NOTE) The GeneXpert MRSA Assay (FDA approved for NASAL specimens only), is one component of a comprehensive MRSA colonization surveillance program. It is not intended to diagnose MRSA infection nor to guide or monitor treatment for MRSA infections. Test performance is not FDA approved in patients less than 9 years old. Performed at [facility], [address], [place], [place] [address]      Studies: No results found.    Joycelyn Das, MD  [facility] 08/15/2023  If 7PM-7AM, please contact night-coverage

## 2023-08-16 DIAGNOSIS — J9600 Acute respiratory failure, unspecified whether with hypoxia or hypercapnia: Secondary | ICD-10-CM | POA: Diagnosis not present

## 2023-08-16 DIAGNOSIS — Z7189 Other specified counseling: Secondary | ICD-10-CM | POA: Diagnosis not present

## 2023-08-16 DIAGNOSIS — R410 Disorientation, unspecified: Secondary | ICD-10-CM | POA: Diagnosis not present

## 2023-08-16 LAB — BASIC METABOLIC PANEL
Anion gap: 7 (ref 5–15)
BUN: 18 mg/dL (ref 8–23)
CO2: 34 mmol/L — ABNORMAL HIGH (ref 22–32)
Calcium: 8.7 mg/dL — ABNORMAL LOW (ref 8.9–10.3)
Chloride: 102 mmol/L (ref 98–111)
Creatinine, Ser: 1.1 mg/dL — ABNORMAL HIGH (ref 0.44–1.00)
GFR, Estimated: 52 mL/min — ABNORMAL LOW (ref >60)
Glucose, Bld: 101 mg/dL — ABNORMAL HIGH (ref 70–99)
Potassium: 4.1 mmol/L (ref 3.5–5.1)
Sodium: 143 mmol/L (ref 135–145)

## 2023-08-16 LAB — CBC
HCT: 26.9 % — ABNORMAL LOW (ref 36.0–46.0)
Hemoglobin: 8.1 g/dL — ABNORMAL LOW (ref 12.0–15.0)
MCH: 28.2 pg (ref 26.0–34.0)
MCHC: 30.1 g/dL (ref 30.0–36.0)
MCV: 93.7 fL (ref 80.0–100.0)
Platelets: 258 10*3/uL (ref 150–400)
RBC: 2.87 MIL/uL — ABNORMAL LOW (ref 3.87–5.11)
RDW: 17.3 % — ABNORMAL HIGH (ref 11.5–15.5)
WBC: 11.4 10*3/uL — ABNORMAL HIGH (ref 4.0–10.5)
nRBC: 0 % (ref 0.0–0.2)

## 2023-08-16 NOTE — Progress Notes (Signed)
Mobility Specialist Progress Note   08/16/23 1000  Mobility  Activity Ambulated with assistance in hallway;Dangled on edge of bed  Level of Assistance Contact guard assist, steadying assist  Assistive Device Front wheel walker  Distance Ambulated (ft) 200 ft  Range of Motion/Exercises Active;All extremities  Activity Response Tolerated well   Pre Ambulation:  HR 68, SpO2 100% 2LO2 During Ambulation: HR 74, SpO2 97-93% 2LO2 Post Ambulation: HR 68, SpO2 98% 2LO2  Patient received in supine and agreeable to participate. Presented with slight confusion, otherwise A&O x3. Completed bed mobility with supervision, needing min A to scoot hips to EOB. Stood with minimal HHA and discovered patient was soiled with stool. Performed pericare with max A while standing EOB. Patient then ambulated supervision to min guard with slow steady gait. Oxygen saturation maintained >93% on 2LO2. Returned to room without complaint or incident. Was left in supine with all needs met, call bell in reach.   Kendra Watts, BS EXP Mobility Specialist Please contact via SecureChat or Rehab office at [phone]

## 2023-08-16 NOTE — Plan of Care (Signed)

## 2023-08-16 NOTE — Plan of Care (Signed)

## 2023-08-16 NOTE — Progress Notes (Signed)
Daily Progress Note   Patient Name: Kendra Watts       Date: 08/16/2023 DOB: Nov 29, 1945  Age: 78 y.o. MRN#: [id number] Attending Physician: Joycelyn Das, MD Primary Care Physician: Georgianne Fick, MD Admit Date: 08/08/2023  Reason for Consultation/Follow-up: Establishing goals of care   Length of Stay: 8  Current Medications: Scheduled Meds:   arformoterol  15 mcg Nebulization BID   aspirin  81 mg Oral Daily   budesonide (PULMICORT) nebulizer solution  0.5 mg Nebulization BID   Chlorhexidine Gluconate Cloth  6 each Topical Daily   docusate sodium  100 mg Oral BID   feeding supplement  237 mL Oral BID BM   levETIRAcetam  500 mg Oral BID   levothyroxine  88 mcg Oral QAC breakfast   metoprolol succinate  50 mg Oral Daily   multivitamin with minerals  1 tablet Oral Daily   pantoprazole  40 mg Oral QHS   revefenacin  175 mcg Nebulization Daily   rosuvastatin  10 mg Oral Daily   topiramate  50 mg Oral BID    Continuous Infusions:    PRN Meds: docusate sodium, haloperidol lactate, hydrALAZINE, ondansetron (ZOFRAN) IV, mouth rinse, polyethylene glycol  Physical Exam Vitals reviewed.  Constitutional:      General: She is not in acute distress.    Appearance: She is ill-appearing.  HENT:     Head: Normocephalic and atraumatic.  Cardiovascular:     Rate and Rhythm: Normal rate.  Pulmonary:     Effort: Pulmonary effort is normal.  Skin:    General: Skin is warm and dry.  Neurological:     Mental Status: She is alert. She is disoriented.             Vital Signs: BP 99/63 (BP Location: Right Arm)   Pulse (!) 56   Temp (!) 97.5 F (36.4 C)   Resp 16   Ht 5' (1.524 m)   Wt 39.9 kg   SpO2 100%   BMI 17.18 kg/m  SpO2: SpO2: 100 % O2 Device: O2 Device: Nasal  Cannula O2 Flow Rate: O2 Flow Rate (L/min): 2 L/min      Patient Active Problem List   Diagnosis Date Noted   AMS (altered mental status) 08/08/2023   NSTEMI (non-ST elevated myocardial infarction) (HCC) 07/11/2023   Acute  cystitis 11/22/2021   Delirium due to multiple etiologies, acute, hyperactive 11/21/2021   Acute metabolic encephalopathy 11/21/2021   Hypothyroidism, unspecified 11/21/2021   Purulent bronchitis (HCC) 11/16/2021   Urinary retention 11/16/2021   DNR (do not resuscitate) 11/16/2021   Intracerebral hemorrhage 11/13/2021   Protein-calorie malnutrition, severe 11/13/2021   Status epilepticus (HCC)    Acute on chronic diastolic heart failure (HCC) 05/23/2020   COPD with acute exacerbation (HCC) 05/23/2020   Multiple closed fractures of ribs of left side    Fall    Palliative care by specialist    Goals of care, counseling/discussion    Advanced directives, counseling/discussion    Generalized weakness    Fracture of one rib, left side, initial encounter for closed fracture 04/08/2020   Fracture of greater trochanter of left femur (HCC) 04/08/2020   Closed fracture of greater trochanter of left femur (HCC) 04/08/2020   Epistaxis 03/13/2020   Acute blood loss anemia 03/13/2020   Current smoker    Hypertension, uncontrolled    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)    Hemoptysis 09/14/2018   Squamous cell lung cancer, left (HCC) 09/10/2018   Adenocarcinoma, lung, 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   Multiple lung nodules 07/20/2018   Chronic obstructive pulmonary disease (HCC) 07/20/2018   Acute CHF (congestive heart failure) (HCC) 07/06/2018   Chronic respiratory failure with hypoxia (HCC) 07/06/2018   Hyponatremia 07/06/2018   Anemia 07/06/2018   Breast cancer of upper-outer quadrant of left female breast (HCC) 09/08/2015   Renal artery stenosis (HCC) 08/23/2014   Right upper extremity numbness 07/01/2013   Atherosclerotic renal artery stenosis, bilateral (HCC)  05/26/2013   Peripheral arterial disease (HCC) 05/26/2013   Essential hypertension 05/26/2013   Hyperlipidemia 05/26/2013   Tobacco abuse 05/26/2013   Carotid artery stenosis 03/09/2012    Palliative Care Assessment & Plan   Patient Profile: 78 y.o. 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 Pt bagged enroute and intubated in ED and extubated same day. Concern noted for possible absence seizures. Rapid response called for episode of unresponsiveness 08/07/22.   Possible recurrent lung cancer (08/06/2022).  Today's Discussion: Patient is sitting up watching television. She is oriented to person and place.  She is waiting for breakfast and hopes to get out of the hospital soon.   815 spoke with sister [name]. [name] shared the patient will be going to [facility] with outpatient palliative care. We discussed the patient's code status and declaration for a natural death at length. Recommended continuation of DNR status, understanding evidenced-based poor outcomes in similar hospitalized patients, as the cause of the arrest is likely associated with chronic/terminal disease rather than a reversible acute cardio-pulmonary event. We discussed the patient's poor prognosis with several life limiting chronic conditions. [name] agrees to continuation of DNR status.   We discussed  limitations of ongoing interventions and  risk for further decline despite aggressive treatment efforts. Encouraged [name] to consider what scope of care the patient might want moving forward and consider if she would want to continue being hospitalized or if she would want to transition to comfort measures in that situation.   Encouraged family to call PMT with  questions or concerns.  Recommendations/Plan: DNR DNI Discharge to [facility] with outpatient palliative care Encouraged sister to continue  conversations around goals of care with nephew Continued PMT support: Plan    Extensive chart review has been completed prior to seeing the patient including labs, vital signs, imaging, progress/consult notes, orders, medications, and available advance directive documents.  Care plan was discussed with Dr. Tyson Babinski  Time spent: 50 minutes  Thank you for allowing the Palliative Medicine Team to assist in the care of this patient.   Sherryll Burger, NP  Please contact Palliative Medicine Team phone at [phone] for questions and concerns.

## 2023-08-16 NOTE — Progress Notes (Addendum)
PROGRESS NOTE  Kendra MADERA ZOX:[id number] DOB: 08/23/1945 DOA: 08/08/2023 PCP: Georgianne Fick, MD   LOS: 8 days   Brief narrative:  78 year old female with past medical history of O2 dependent COPD,  lung cancer with prior SBRT, failure to thrive, severe protein calorie malnutrition, prior seizures, peripheral artery disease, chronic heart failure with preserved ejection fraction, hypertension, hypothyroidism presented presented to hospital after being found obtunded at the skilled nursing facility.  Patient was intubated in the ED and was admitted in ICU.  Subsequently, she was extubated and transferred to the medical floor.  On the floor she was noted to have episodes of staring into the space along with getting mute.  Neurology was consulted.  She was put on long-term EEG on 1/17 which was negative.  Given her prior left occipital hemorrhage she was considered to be high risk for seizures so was given load of valproic acid and Keppra.  Assessment/Plan: Principal Problem:   AMS (altered mental status)  Acute metabolic encephalopathy/history of seizures, delirium. Resolved at this time.  Neurology was consulted.  Long-term EEG monitoring was done which was negative so discontinued.  MRI of the brain was negative for acute findings..  On Keppra.  Haldol as needed.  Neurology has signed off.  .   Acute on chronic anemia with melena During hospitalization, hemoglobin dropped to 6.5 and required 1 unit of PRBC. FOBT was positive.  GI was notified.  Hemoglobin of 7.0  from 7.5.  Hold off with aspirin for now due to ongoing melena.  Hemoglobin of 7.0 on 08/14/23 so  transfused 1 unit of packed RBC.  Hemoglobin today at 8.1 from 8.5 yesterday..  Seen by GI and has been signed off since patient is not a good candidate for aggressive  intervention.  Recommendation is to continue with Protonix.  No mention of melena overnight.  Has not had a bowel movement today.  Acute on chronic hypoxemic and  hypercarbic respiratory failure History of oxygen dependent COPD, question aspiration.  Has completed Zosyn.  Continue Pulmicort, Roxy Manns Currently on 2 L of oxygen by nasal cannula.   Essential Hypertension:  on amlodipine and Lopressor, Cardura as outpatient.  Had hypotension which has resolved at this time.  Off pressors.  Hypothyroidism: Continue Synthroid.   Hypokalemia: Improved after repletion.  Latest potassium of 4.31  Recent history of Non-STEMI:  Seen by cardiology in previous admission and was on heparin drip and was subsequently started on metoprolol.  Pulmonary nodule:  1.6 cm superior segment of the left lower lobe.  Follows up with Dr. Delton Coombes, pulmonary as outpatient.   Plan was to repeat another CT chest in 6 months    Chronic HFpEF   2D echocardiogram from 4/23 with LV ejection fraction of 55 to 60% with diastolic dysfunction.  Not on diuretics at this time.     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 Continue crestor   PAD: Extensive PAD history with renal artery stenosis status post bilateral renal artery stenting, carotid disease status post right CEA, mild lower extremity arterial disease treated medically.  Continue aspirin and statins.   Debility deconditioning.  Patient has been seen by physical therapy and recommend skilled nursing facility.  Goals of care.  Palliative care on board.  Patient is DNR.  Overall prognosis of the patient is poor.  Palliative care following with family.  Palliative care yet to talk with patient's family regarding further goals of care. DVT prophylaxis:  Place and maintain sequential compression device Start: 08/11/23 1647 SCDs Start: 08/08/23 1021   Disposition: Skilled nursing facility, ongoing palliative care discussion.  Medically stable for disposition.  Status is: Inpatient  Remains inpatient appropriate because:  need for skilled nursing facility,     Code Status:     Code Status: Do not attempt resuscitation (DNR) - Comfort care  Family Communication: Spoke with the patient's sister on the phone on 08/14/2023   consultants: Palliative care Neurology GI  Procedures: Long-term EEG PRBC transfusion 2 unit Intubation, mechanical ventilation and extubation.  Anti-infectives:  None currently  Anti-infectives (From admission, onward)    Start     Dose/Rate Route Frequency Ordered Stop   08/08/23 1800  piperacillin-tazobactam (ZOSYN) IVPB 3.375 g        3.375 g 12.5 mL/hr over 240 Minutes Intravenous Every 8 hours 08/08/23 1042 08/12/23 2359   08/08/23 1045  piperacillin-tazobactam (ZOSYN) IVPB 3.375 g        3.375 g 100 mL/hr over 30 Minutes Intravenous  Once 08/08/23 1042 08/08/23 1314       Subjective:  Today, patient was seen and examined at bedside.  Patient denies any nausea, vomiting, fever, chills or rigor.  Has not had a bowel movement since yesterday.  Denies any chest pain, shortness of breath.   Objective: Vitals:   08/16/23 0801 08/16/23 0926  BP: 96/72 106/67  Pulse:  96  Resp:  15  Temp:    SpO2:  100%   No intake or output data in the 24 hours ending 08/16/23 1011  Filed Weights   08/13/23 0703 08/15/23 0500 08/16/23 0500  Weight: 39.9 kg 36.7 kg 39.9 kg   Body mass index is 17.18 kg/m.   Physical Exam:  GENERAL: Thinly built, awake and Communicative, not in obvious distress.   Appears chronically ill and frail. HENT: Pallor noted.  Pupils equally reactive to light. Oral mucosa is moist, on nasal cannula oxygen NECK: is supple, no gross swelling noted. CHEST: .  Diminished breath sounds bilaterally. CVS: S1 and S2 heard, no murmur. Regular rate and rhythm.  ABDOMEN: Soft, non-tender, bowel sounds are present.  External urinary catheter in place. EXTREMITIES: No edema.  Thin extremities. CNS: Cranial nerves are intact.  Generalized weakness noted.  Moves all extremities. SKIN: warm and dry  without rashes.  Data Review: I have personally reviewed the following laboratory data and studies,  CBC: Recent Labs  Lab 08/11/23 0601 08/12/23 0542 08/13/23 0548 08/14/23 0704 08/14/23 1756 08/15/23 0648 08/16/23 0726  WBC 10.2 11.7* 11.4* 10.1  --   --  11.4*  NEUTROABS  --   --   --  7.0  --   --   --   HGB 8.4* 8.3* 7.5* 7.0* 9.0* 8.5* 8.1*  HCT 28.0* 27.6* 26.1* 23.9* 29.5* 28.0* 26.9*  MCV 90.3 91.4 92.9 93.4  --   --  93.7  PLT 267 275 289 273  --   --  258   Basic Metabolic Panel: Recent Labs  Lab 08/10/23 0220 08/11/23 0601 08/12/23 0542 08/13/23 0548 08/14/23 0704 08/15/23 0648 08/16/23 0726  NA 137 142 142 145 143 145 143  K 3.8 3.6 3.1* 4.3 4.0 4.0 4.1  CL 86* 91* 94* 99 100 104 102  CO2 40* 43* 42* 37* 35* 34* 34*  GLUCOSE 112* 102* 89 103* 102* 96 101*  BUN 22 14 14 16 17 20 18   CREATININE 0.94 0.89 0.79 0.85 0.74 0.98 1.10*  CALCIUM 8.6*  8.6* 8.6* 8.9 8.9 8.8* 8.7*  MG 2.6* 2.1 2.0 2.0 2.1  --   --   PHOS 4.2 3.3 2.5 2.8  --   --   --    Liver Function Tests: No results for input(s): "AST", "ALT", "ALKPHOS", "BILITOT", "PROT", "ALBUMIN" in the last 168 hours.  No results for input(s): "LIPASE", "AMYLASE" in the last 168 hours. No results for input(s): "AMMONIA" in the last 168 hours. Cardiac Enzymes: No results for input(s): "CKTOTAL", "CKMB", "CKMBINDEX", "TROPONINI" in the last 168 hours.  BNP (last 3 results) Recent Labs    07/11/23 0841 08/08/23 0922  BNP 1,494.1* 582.8*    ProBNP (last 3 results) No results for input(s): "PROBNP" in the last 8760 hours.  CBG: Recent Labs  Lab 08/09/23 1131 08/10/23 1532  GLUCAP 126* 104*   Recent Results (from the past 240 hours)  Blood culture (routine x 2)     Status: None   Collection Time: 08/08/23 10:47 AM   Specimen: BLOOD LEFT ARM  Result Value Ref Range Status   Specimen Description BLOOD LEFT ARM  Final   Special Requests   Final    BOTTLES DRAWN AEROBIC AND ANAEROBIC Blood  Culture results may not be optimal due to an inadequate volume of blood received in culture bottles   Culture   Final    NO GROWTH 5 DAYS Performed at [name], [address], [place], [place] [address]    Report Status 08/13/2023 FINAL  Final  Resp panel by RT-PCR (RSV, Flu A&B, Covid) Anterior Nasal Swab     Status: None   Collection Time: 08/08/23 10:48 AM   Specimen: Anterior Nasal Swab  Result Value Ref Range Status   SARS Coronavirus 2 by RT PCR NEGATIVE NEGATIVE Final   Influenza A by PCR NEGATIVE NEGATIVE Final   Influenza B by PCR NEGATIVE NEGATIVE Final    Comment: (NOTE) The Xpert Xpress SARS-CoV-2/FLU/RSV plus assay is intended as an aid in the diagnosis of influenza from Nasopharyngeal swab specimens and should not be used as a sole basis for treatment. Nasal washings and aspirates are unacceptable for Xpert Xpress SARS-CoV-2/FLU/RSV testing.  Fact Sheet for Patients: BloggerCourse.com  Fact Sheet for Healthcare Providers: SeriousBroker.it  This test is not yet approved or cleared by the [place] FDA and has been authorized for detection and/or diagnosis of SARS-CoV-2 by FDA under an Emergency Use Authorization (EUA). This EUA will remain in effect (meaning this test can be used) for the duration of the COVID-19 declaration under Section 564(b)(1) of the Act, 21 U.S.C. section 360bbb-3(b)(1), unless the authorization is terminated or revoked.     Resp Syncytial Virus by PCR NEGATIVE NEGATIVE Final    Comment: (NOTE) Fact Sheet for Patients: BloggerCourse.com  Fact Sheet for Healthcare Providers: SeriousBroker.it  This test is not yet approved or cleared by the [place] FDA and has been authorized for detection and/or diagnosis of SARS-CoV-2 by FDA under an Emergency Use Authorization (EUA). This EUA will remain in effect (meaning this test  can be used) for the duration of the COVID-19 declaration under Section 564(b)(1) of the Act, 21 U.S.C. section 360bbb-3(b)(1), unless the authorization is terminated or revoked.  Performed at [facility], [address] [place], [place] [address]   Blood culture (routine x 2)     Status: None   Collection Time: 08/08/23 12:10 PM   Specimen: BLOOD  Result Value Ref Range Status   Specimen Description BLOOD SITE  NOT SPECIFIED  Final   Special Requests   Final    BOTTLES DRAWN AEROBIC AND ANAEROBIC Blood Culture results may not be optimal due to an inadequate volume of blood received in culture bottles   Culture   Final    NO GROWTH 5 DAYS Performed at [facility], [address] [place], [place] [address]    Report Status 08/13/2023 FINAL  Final  MRSA Next Gen by PCR, Nasal     Status: None   Collection Time: 08/08/23 12:48 PM   Specimen: Nasal Mucosa; Nasal Swab  Result Value Ref Range Status   MRSA by PCR Next Gen NOT DETECTED NOT DETECTED Final    Comment: (NOTE) The GeneXpert MRSA Assay (FDA approved for NASAL specimens only), is one component of a comprehensive MRSA colonization surveillance program. It is not intended to diagnose MRSA infection nor to guide or monitor treatment for MRSA infections. Test performance is not FDA approved in patients less than 61 years old. Performed at [facility], [address], [place], [place] [address]      Studies: No results found.    Joycelyn Das, MD  [facility] 08/16/2023  If 7PM-7AM, please contact night-coverage

## 2023-08-17 DIAGNOSIS — Z7189 Other specified counseling: Secondary | ICD-10-CM | POA: Diagnosis not present

## 2023-08-17 DIAGNOSIS — J9600 Acute respiratory failure, unspecified whether with hypoxia or hypercapnia: Secondary | ICD-10-CM | POA: Diagnosis not present

## 2023-08-17 DIAGNOSIS — R410 Disorientation, unspecified: Secondary | ICD-10-CM | POA: Diagnosis not present

## 2023-08-17 DIAGNOSIS — Z515 Encounter for palliative care: Secondary | ICD-10-CM | POA: Diagnosis not present

## 2023-08-17 NOTE — Plan of Care (Signed)
  Problem: Education: Goal: Knowledge of General Education information will improve Description: Including pain rating scale, medication(s)/side effects and non-pharmacologic comfort measures Outcome: Progressing   Problem: Nutrition: Goal: Adequate nutrition will be maintained Outcome: Progressing   Problem: Nutrition: Goal: Adequate nutrition will be maintained Outcome: Progressing   Problem: Safety: Goal: Ability to remain free from injury will improve Outcome: Progressing

## 2023-08-17 NOTE — Progress Notes (Signed)
Daily Progress Note   Patient Name: Kendra Watts       Date: 08/17/2023 DOB: 1946-02-07  Age: 78 y.o. MRN#: [id number] Attending Physician: Joycelyn Das, MD Primary Care Physician: Georgianne Fick, MD Admit Date: 08/08/2023  Reason for Consultation/Follow-up: Establishing goals of care   Length of Stay: 9  Current Medications: Scheduled Meds:   arformoterol  15 mcg Nebulization BID   aspirin  81 mg Oral Daily   budesonide (PULMICORT) nebulizer solution  0.5 mg Nebulization BID   Chlorhexidine Gluconate Cloth  6 each Topical Daily   docusate sodium  100 mg Oral BID   feeding supplement  237 mL Oral BID BM   levETIRAcetam  500 mg Oral BID   levothyroxine  88 mcg Oral QAC breakfast   metoprolol succinate  50 mg Oral Daily   multivitamin with minerals  1 tablet Oral Daily   pantoprazole  40 mg Oral QHS   revefenacin  175 mcg Nebulization Daily   rosuvastatin  10 mg Oral Daily   topiramate  50 mg Oral BID    Continuous Infusions:    PRN Meds: docusate sodium, haloperidol lactate, hydrALAZINE, ondansetron (ZOFRAN) IV, mouth rinse, polyethylene glycol  Physical Exam Vitals reviewed.  Constitutional:      General: She is not in acute distress.    Appearance: She is ill-appearing.  HENT:     Head: Normocephalic and atraumatic.  Cardiovascular:     Rate and Rhythm: Normal rate.  Pulmonary:     Effort: Pulmonary effort is normal.  Skin:    General: Skin is warm and dry.  Neurological:     Mental Status: She is alert. She is disoriented and confused.             Vital Signs: BP 102/70   Pulse 75   Temp 98.4 F (36.9 C) (Oral)   Resp 16   Ht 5' (1.524 m)   Wt 37.6 kg   SpO2 95%   BMI 16.19 kg/m  SpO2: SpO2: 95 % O2 Device: O2 Device: Nasal Cannula O2  Flow Rate: O2 Flow Rate (L/min): 2.5 L/min      Patient Active Problem List   Diagnosis Date Noted   AMS (altered mental status) 08/08/2023   NSTEMI (non-ST elevated myocardial infarction) (HCC) 07/11/2023   Acute cystitis 11/22/2021  Delirium due to multiple etiologies, acute, hyperactive 11/21/2021   Acute metabolic encephalopathy 11/21/2021   Hypothyroidism, unspecified 11/21/2021   Purulent bronchitis (HCC) 11/16/2021   Urinary retention 11/16/2021   DNR (do not resuscitate) 11/16/2021   Intracerebral hemorrhage 11/13/2021   Protein-calorie malnutrition, severe 11/13/2021   Status epilepticus (HCC)    Acute on chronic diastolic heart failure (HCC) 05/23/2020   COPD with acute exacerbation (HCC) 05/23/2020   Multiple closed fractures of ribs of left side    Fall    Palliative care by specialist    Goals of care, counseling/discussion    Advanced directives, counseling/discussion    Generalized weakness    Fracture of one rib, left side, initial encounter for closed fracture 04/08/2020   Fracture of greater trochanter of left femur (HCC) 04/08/2020   Closed fracture of greater trochanter of left femur (HCC) 04/08/2020   Epistaxis 03/13/2020   Acute blood loss anemia 03/13/2020   Current smoker    Hypertension, uncontrolled    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)    Hemoptysis 09/14/2018   Squamous cell lung cancer, left (HCC) 09/10/2018   Adenocarcinoma, lung, 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   Multiple lung nodules 07/20/2018   Chronic obstructive pulmonary disease (HCC) 07/20/2018   Acute CHF (congestive heart failure) (HCC) 07/06/2018   Chronic respiratory failure with hypoxia (HCC) 07/06/2018   Hyponatremia 07/06/2018   Anemia 07/06/2018   Breast cancer of upper-outer quadrant of left female breast (HCC) 09/08/2015   Renal artery stenosis (HCC) 08/23/2014   Right upper extremity numbness 07/01/2013   Atherosclerotic renal artery stenosis, bilateral (HCC) 05/26/2013    Peripheral arterial disease (HCC) 05/26/2013   Essential hypertension 05/26/2013   Hyperlipidemia 05/26/2013   Tobacco abuse 05/26/2013   Carotid artery stenosis 03/09/2012    Palliative Care Assessment & Plan   Patient Profile: 78 y.o. 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 Pt bagged enroute and intubated in ED and extubated same day. Concern noted for possible absence seizures. Rapid response called for episode of unresponsiveness 08/07/22.   Possible recurrent lung cancer (08/06/2022).  Today's Discussion: Patient is sitting up watching television. She is oriented to person and place. She reports no pain or discomfort. She does have some confusion as she shared her sister came to visit this morning and they spoke in the hallway. Her sister shared this was not accurate and that she had not been to visit the patient.   9:30 Spoke with sister Kendra Watts by phone. She was able to talk with patient's nephew about goals of care yesterday but did not make any definitive decisions. We discussed her poor prognosis at length. Home hospice is not an option for the patient. Currently the patient is eating most of her meals and would likely not qualify for inpatient hospice. After talking with the sister today, she would be interested in inpatient as soon as she does qualify.   Her sister is her decision maker and supports a discharge back to [facility]. She does understands the patient will likely not improve while at [facility] due to her failure to thrive and disease trajectory. She does want outpatient palliative care services while at [facility]-- [facility] Liaison is aware and was updated.  We discussed  limitations of ongoing  interventions and  risk for further decline despite aggressive treatment efforts. Encouraged Kendra Watts to consider completing a MOST form when she visits to delineate the scope of care the patient might want moving forward. She will call PMT if/when she visits to compete the document.   Encouraged family to call PMT with questions or concerns.  Recommendations/Plan: DNR DNI Discharge to [facility] with outpatient palliative care [facility] liaison updated) Complete MOST form if sister comes to visit Continued PMT support as needed   Extensive chart review has been completed prior to seeing the patient including labs, vital signs, imaging, progress/consult notes, orders, medications, and available advance directive documents.  Care plan was discussed with Dr. Tyson Babinski and [facility] hosp[facility] liaison  Time spent: 50 minutes  Thank you for allowing the Palliative Medicine Team to assist in the care of this patient.   Sherryll Burger, NP  Please contact Palliative Medicine Team phone at [phone] for questions and concerns.

## 2023-08-17 NOTE — Progress Notes (Signed)
PROGRESS NOTE  Kendra Watts YQM:[id number] DOB: 04/14/1946 DOA: 08/08/2023 PCP: Georgianne Fick, MD   LOS: 9 days   Brief narrative:  78 year old female with past medical history of O2 dependent COPD,  lung cancer with prior SBRT, failure to thrive, severe protein calorie malnutrition, prior seizures, peripheral artery disease, chronic heart failure with preserved ejection fraction, hypertension, hypothyroidism presented presented to hospital after being found obtunded at the skilled nursing facility.  Patient was intubated in the ED and was admitted in ICU.  Subsequently, she was extubated and transferred to the medical floor.  On the floor she was noted to have episodes of staring into the space along with getting mute.  Neurology was consulted.  She was put on long-term EEG on 1/17 which was negative.  Given her prior left occipital hemorrhage she was considered to be high risk for seizures so was given load of valproic acid and Keppra.  Assessment/Plan: Principal Problem:   AMS (altered mental status)  Acute metabolic encephalopathy/history of seizures, delirium. Resolved at this time.  No more agitation.  Neurology was consulted.  Long-term EEG monitoring was done which was negative so discontinued.  MRI of the brain was negative for acute findings..  On Keppra.  Haldol as needed.  Neurology has signed off.     Acute on chronic anemia with melena During hospitalization, hemoglobin dropped to 6.5 and required 1 unit of PRBC. FOBT was positive.  GI was notified.  Hemoglobin of 8.1.   Seen by GI and has been signed off since patient is not a good candidate for aggressive  intervention.  Recommendation is to continue with Protonix.  No mention of melena overnight.  Has not had a bowel movement today.  Check CBC in AM.  Acute on chronic hypoxemic and hypercarbic respiratory failure History of oxygen dependent COPD, question aspiration.  Has completed Zosyn.  Continue Pulmicort, Roxy Manns Currently on 2 L of oxygen by nasal cannula.   Essential Hypertension:  on amlodipine and Lopressor, Cardura as outpatient.  Tolerating okay.  Had hypotension which has resolved at this time.  Off pressors.  Hypothyroidism: Continue Synthroid.   Hypokalemia: Improved after repletion.  Latest potassium of 4.1  Recent history of Non-STEMI:  Was initially on heparin drip which was subsequently discontinued.  Currently on metoprolol and aspirin.  Pulmonary nodule:  1.6 cm superior segment of the left lower lobe.  Follows up with Dr. Delton Coombes, pulmonary as outpatient.   Plan was to repeat another CT chest in 6 months    Chronic HFpEF   2D echocardiogram from 4/23 with LV ejection fraction of 55 to 60% with diastolic dysfunction.  Not on diuretics at this time.     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 Continue crestor   PAD: Extensive PAD history with renal artery stenosis status post bilateral renal artery stenting, carotid disease status post right CEA, mild lower extremity arterial disease treated medically.  Continue aspirin and statins.   Debility deconditioning.  Patient has been seen by physical therapy and recommend skilled nursing facility.  Goals of care.  Palliative care on board.  Patient is DNR.  Overall prognosis of the patient is poor.  Palliative care following with family.    DVT prophylaxis: Place and maintain sequential compression device Start: 08/11/23 1647 SCDs Start: 08/08/23 1021   Disposition: Skilled nursing facility, ongoing palliative care discussion.  Medically stable for disposition.  Status is: Inpatient  Remains inpatient appropriate  because:  need for skilled nursing facility,    Code Status:     Code Status: Do not attempt resuscitation (DNR) - Comfort care  Family Communication: Spoke with the patient's sister on the phone on 08/14/2023   consultants: Palliative  care Neurology GI  Procedures: Long-term EEG PRBC transfusion 2 unit Intubation, mechanical ventilation and extubation.  Anti-infectives:  None currently  Anti-infectives (From admission, onward)    Start     Dose/Rate Route Frequency Ordered Stop   08/08/23 1800  piperacillin-tazobactam (ZOSYN) IVPB 3.375 g        3.375 g 12.5 mL/hr over 240 Minutes Intravenous Every 8 hours 08/08/23 1042 08/12/23 2359   08/08/23 1045  piperacillin-tazobactam (ZOSYN) IVPB 3.375 g        3.375 g 100 mL/hr over 30 Minutes Intravenous  Once 08/08/23 1042 08/08/23 1314       Subjective:  Today, patient was seen and examined at bedside.  Patient denies any bowel movements.  No nausea vomiting shortness of breath fever chills or rigor.    Objective: Vitals:   08/17/23 0745 08/17/23 0951  BP: 100/70 102/70  Pulse: 74 75  Resp: 16   Temp: 98.4 F (36.9 C)   SpO2: 95%     Intake/Output Summary (Last 24 hours) at 08/17/2023 1021 Last data filed at 08/16/2023 1700 Gross per 24 hour  Intake 200 ml  Output 400 ml  Net -200 ml    Filed Weights   08/15/23 0500 08/16/23 0500 08/17/23 0500  Weight: 36.7 kg 39.9 kg 37.6 kg   Body mass index is 16.19 kg/m.   Physical Exam:  GENERAL: Thinly built, awake and Communicative, not in obvious distress.   Appears chronically ill and frail. HENT: Pallor noted.  Pupils equally reactive to light. Oral mucosa is moist, on nasal cannula oxygen NECK: is supple, no gross swelling noted. CHEST: .  Diminished breath sounds bilaterally. CVS: S1 and S2 heard, no murmur. Regular rate and rhythm.  ABDOMEN: Soft, non-tender, bowel sounds are present.  External urinary catheter in place. EXTREMITIES: No edema.  Thin extremities.  Wasting of muscles noted. CNS: Cranial nerves are intact.  Generalized weakness noted.  Moves all extremities. SKIN: warm and dry without rashes.  Data Review: I have personally reviewed the following laboratory data and  studies,  CBC: Recent Labs  Lab 08/11/23 0601 08/12/23 0542 08/13/23 0548 08/14/23 0704 08/14/23 1756 08/15/23 0648 08/16/23 0726  WBC 10.2 11.7* 11.4* 10.1  --   --  11.4*  NEUTROABS  --   --   --  7.0  --   --   --   HGB 8.4* 8.3* 7.5* 7.0* 9.0* 8.5* 8.1*  HCT 28.0* 27.6* 26.1* 23.9* 29.5* 28.0* 26.9*  MCV 90.3 91.4 92.9 93.4  --   --  93.7  PLT 267 275 289 273  --   --  258   Basic Metabolic Panel: Recent Labs  Lab 08/11/23 0601 08/12/23 0542 08/13/23 0548 08/14/23 0704 08/15/23 0648 08/16/23 0726  NA 142 142 145 143 145 143  K 3.6 3.1* 4.3 4.0 4.0 4.1  CL 91* 94* 99 100 104 102  CO2 43* 42* 37* 35* 34* 34*  GLUCOSE 102* 89 103* 102* 96 101*  BUN 14 14 16 17 20 18   CREATININE 0.89 0.79 0.85 0.74 0.98 1.10*  CALCIUM 8.6* 8.6* 8.9 8.9 8.8* 8.7*  MG 2.1 2.0 2.0 2.1  --   --   PHOS 3.3 2.5 2.8  --   --   --  Liver Function Tests: No results for input(s): "AST", "ALT", "ALKPHOS", "BILITOT", "PROT", "ALBUMIN" in the last 168 hours.  No results for input(s): "LIPASE", "AMYLASE" in the last 168 hours. No results for input(s): "AMMONIA" in the last 168 hours. Cardiac Enzymes: No results for input(s): "CKTOTAL", "CKMB", "CKMBINDEX", "TROPONINI" in the last 168 hours.  BNP (last 3 results) Recent Labs    07/11/23 0841 08/08/23 0922  BNP 1,494.1* 582.8*    ProBNP (last 3 results) No results for input(s): "PROBNP" in the last 8760 hours.  CBG: Recent Labs  Lab 08/10/23 1532  GLUCAP 104*   Recent Results (from the past 240 hours)  Blood culture (routine x 2)     Status: None   Collection Time: 08/08/23 10:47 AM   Specimen: BLOOD LEFT ARM  Result Value Ref Range Status   Specimen Description BLOOD LEFT ARM  Final   Special Requests   Final    BOTTLES DRAWN AEROBIC AND ANAEROBIC Blood Culture results may not be optimal due to an inadequate volume of blood received in culture bottles   Culture   Final    NO GROWTH 5 DAYS Performed at [facility]  Lab, [address] [place], [place] [address]    Report Status 08/13/2023 FINAL  Final  Resp panel by RT-PCR (RSV, Flu A&B, Covid) Anterior Nasal Swab     Status: None   Collection Time: 08/08/23 10:48 AM   Specimen: Anterior Nasal Swab  Result Value Ref Range Status   SARS Coronavirus 2 by RT PCR NEGATIVE NEGATIVE Final   Influenza A by PCR NEGATIVE NEGATIVE Final   Influenza B by PCR NEGATIVE NEGATIVE Final    Comment: (NOTE) The Xpert Xpress SARS-CoV-2/FLU/RSV plus assay is intended as an aid in the diagnosis of influenza from Nasopharyngeal swab specimens and should not be used as a sole basis for treatment. Nasal washings and aspirates are unacceptable for Xpert Xpress SARS-CoV-2/FLU/RSV testing.  Fact Sheet for Patients: BloggerCourse.com  Fact Sheet for Healthcare Providers: SeriousBroker.it  This test is not yet approved or cleared by the [place] FDA and has been authorized for detection and/or diagnosis of SARS-CoV-2 by FDA under an Emergency Use Authorization (EUA). This EUA will remain in effect (meaning this test can be used) for the duration of the COVID-19 declaration under Section 564(b)(1) of the Act, 21 U.S.C. section 360bbb-3(b)(1), unless the authorization is terminated or revoked.     Resp Syncytial Virus by PCR NEGATIVE NEGATIVE Final    Comment: (NOTE) Fact Sheet for Patients: BloggerCourse.com  Fact Sheet for Healthcare Providers: SeriousBroker.it  This test is not yet approved or cleared by the [place] FDA and has been authorized for detection and/or diagnosis of SARS-CoV-2 by FDA under an Emergency Use Authorization (EUA). This EUA will remain in effect (meaning this test can be used) for the duration of the COVID-19 declaration under Section 564(b)(1) of the Act, 21 U.S.C. section 360bbb-3(b)(1), unless the authorization is terminated  or revoked.  Performed at [facility], [address], [place], [place] [address]   Blood culture (routine x 2)     Status: None   Collection Time: 08/08/23 12:10 PM   Specimen: BLOOD  Result Value Ref Range Status   Specimen Description BLOOD SITE NOT SPECIFIED  Final   Special Requests   Final    BOTTLES DRAWN AEROBIC AND ANAEROBIC Blood Culture results may not be optimal due to an inadequate volume of blood received in culture bottles  Culture   Final    NO GROWTH 5 DAYS Performed at [facility], [address], [place], [place] [address]    Report Status 08/13/2023 FINAL  Final  MRSA Next Gen by PCR, Nasal     Status: None   Collection Time: 08/08/23 12:48 PM   Specimen: Nasal Mucosa; Nasal Swab  Result Value Ref Range Status   MRSA by PCR Next Gen NOT DETECTED NOT DETECTED Final    Comment: (NOTE) The GeneXpert MRSA Assay (FDA approved for NASAL specimens only), is one component of a comprehensive MRSA colonization surveillance program. It is not intended to diagnose MRSA infection nor to guide or monitor treatment for MRSA infections. Test performance is not FDA approved in patients less than 22 years old. Performed at [facility], [address] [place], [place] [address]      Studies: No results found.    Joycelyn Das, MD  [facility] 08/17/2023  If 7PM-7AM, please contact night-coverage

## 2023-08-18 DIAGNOSIS — I6529 Occlusion and stenosis of unspecified carotid artery: Secondary | ICD-10-CM | POA: Diagnosis not present

## 2023-08-18 DIAGNOSIS — F17210 Nicotine dependence, cigarettes, uncomplicated: Secondary | ICD-10-CM | POA: Diagnosis not present

## 2023-08-18 DIAGNOSIS — F4322 Adjustment disorder with anxiety: Secondary | ICD-10-CM | POA: Diagnosis not present

## 2023-08-18 DIAGNOSIS — R410 Disorientation, unspecified: Secondary | ICD-10-CM | POA: Diagnosis not present

## 2023-08-18 DIAGNOSIS — F0390 Unspecified dementia without behavioral disturbance: Secondary | ICD-10-CM | POA: Diagnosis not present

## 2023-08-18 DIAGNOSIS — J9611 Chronic respiratory failure with hypoxia: Secondary | ICD-10-CM | POA: Diagnosis not present

## 2023-08-18 DIAGNOSIS — G9340 Encephalopathy, unspecified: Secondary | ICD-10-CM | POA: Diagnosis not present

## 2023-08-18 DIAGNOSIS — Z8673 Personal history of transient ischemic attack (TIA), and cerebral infarction without residual deficits: Secondary | ICD-10-CM | POA: Diagnosis not present

## 2023-08-18 DIAGNOSIS — M6281 Muscle weakness (generalized): Secondary | ICD-10-CM | POA: Diagnosis not present

## 2023-08-18 DIAGNOSIS — I5032 Chronic diastolic (congestive) heart failure: Secondary | ICD-10-CM | POA: Diagnosis not present

## 2023-08-18 DIAGNOSIS — Z515 Encounter for palliative care: Secondary | ICD-10-CM | POA: Diagnosis not present

## 2023-08-18 DIAGNOSIS — R531 Weakness: Secondary | ICD-10-CM | POA: Diagnosis not present

## 2023-08-18 DIAGNOSIS — E871 Hypo-osmolality and hyponatremia: Secondary | ICD-10-CM | POA: Diagnosis not present

## 2023-08-18 DIAGNOSIS — J9621 Acute and chronic respiratory failure with hypoxia: Secondary | ICD-10-CM | POA: Diagnosis not present

## 2023-08-18 DIAGNOSIS — E43 Unspecified severe protein-calorie malnutrition: Secondary | ICD-10-CM | POA: Diagnosis not present

## 2023-08-18 DIAGNOSIS — J441 Chronic obstructive pulmonary disease with (acute) exacerbation: Secondary | ICD-10-CM | POA: Diagnosis not present

## 2023-08-18 DIAGNOSIS — J189 Pneumonia, unspecified organism: Secondary | ICD-10-CM | POA: Diagnosis not present

## 2023-08-18 DIAGNOSIS — C50412 Malignant neoplasm of upper-outer quadrant of left female breast: Secondary | ICD-10-CM | POA: Diagnosis not present

## 2023-08-18 DIAGNOSIS — D72829 Elevated white blood cell count, unspecified: Secondary | ICD-10-CM | POA: Diagnosis not present

## 2023-08-18 DIAGNOSIS — R509 Fever, unspecified: Secondary | ICD-10-CM | POA: Diagnosis not present

## 2023-08-18 DIAGNOSIS — I509 Heart failure, unspecified: Secondary | ICD-10-CM | POA: Diagnosis not present

## 2023-08-18 DIAGNOSIS — I110 Hypertensive heart disease with heart failure: Secondary | ICD-10-CM | POA: Diagnosis not present

## 2023-08-18 DIAGNOSIS — G40909 Epilepsy, unspecified, not intractable, without status epilepticus: Secondary | ICD-10-CM | POA: Diagnosis not present

## 2023-08-18 DIAGNOSIS — Z7982 Long term (current) use of aspirin: Secondary | ICD-10-CM | POA: Diagnosis not present

## 2023-08-18 DIAGNOSIS — I69319 Unspecified symptoms and signs involving cognitive functions following cerebral infarction: Secondary | ICD-10-CM | POA: Diagnosis not present

## 2023-08-18 DIAGNOSIS — E039 Hypothyroidism, unspecified: Secondary | ICD-10-CM | POA: Diagnosis not present

## 2023-08-18 DIAGNOSIS — F331 Major depressive disorder, recurrent, moderate: Secondary | ICD-10-CM | POA: Diagnosis not present

## 2023-08-18 DIAGNOSIS — E876 Hypokalemia: Secondary | ICD-10-CM | POA: Diagnosis not present

## 2023-08-18 DIAGNOSIS — R41841 Cognitive communication deficit: Secondary | ICD-10-CM | POA: Diagnosis not present

## 2023-08-18 DIAGNOSIS — K219 Gastro-esophageal reflux disease without esophagitis: Secondary | ICD-10-CM | POA: Diagnosis not present

## 2023-08-18 DIAGNOSIS — Z85118 Personal history of other malignant neoplasm of bronchus and lung: Secondary | ICD-10-CM | POA: Diagnosis not present

## 2023-08-18 DIAGNOSIS — J9600 Acute respiratory failure, unspecified whether with hypoxia or hypercapnia: Secondary | ICD-10-CM | POA: Diagnosis not present

## 2023-08-18 DIAGNOSIS — J449 Chronic obstructive pulmonary disease, unspecified: Secondary | ICD-10-CM | POA: Diagnosis not present

## 2023-08-18 DIAGNOSIS — Z7401 Bed confinement status: Secondary | ICD-10-CM | POA: Diagnosis not present

## 2023-08-18 DIAGNOSIS — J9601 Acute respiratory failure with hypoxia: Secondary | ICD-10-CM | POA: Diagnosis not present

## 2023-08-18 DIAGNOSIS — I959 Hypotension, unspecified: Secondary | ICD-10-CM | POA: Diagnosis not present

## 2023-08-18 DIAGNOSIS — Z79899 Other long term (current) drug therapy: Secondary | ICD-10-CM | POA: Diagnosis not present

## 2023-08-18 DIAGNOSIS — I69351 Hemiplegia and hemiparesis following cerebral infarction affecting right dominant side: Secondary | ICD-10-CM | POA: Diagnosis not present

## 2023-08-18 DIAGNOSIS — Z9981 Dependence on supplemental oxygen: Secondary | ICD-10-CM | POA: Diagnosis not present

## 2023-08-18 DIAGNOSIS — N3000 Acute cystitis without hematuria: Secondary | ICD-10-CM | POA: Diagnosis not present

## 2023-08-18 DIAGNOSIS — R0602 Shortness of breath: Secondary | ICD-10-CM | POA: Diagnosis present

## 2023-08-18 DIAGNOSIS — I701 Atherosclerosis of renal artery: Secondary | ICD-10-CM | POA: Diagnosis not present

## 2023-08-18 DIAGNOSIS — S199XXA Unspecified injury of neck, initial encounter: Secondary | ICD-10-CM | POA: Diagnosis not present

## 2023-08-18 DIAGNOSIS — I5033 Acute on chronic diastolic (congestive) heart failure: Secondary | ICD-10-CM | POA: Diagnosis not present

## 2023-08-18 DIAGNOSIS — D500 Iron deficiency anemia secondary to blood loss (chronic): Secondary | ICD-10-CM | POA: Diagnosis not present

## 2023-08-18 DIAGNOSIS — I700 Atherosclerosis of aorta: Secondary | ICD-10-CM | POA: Diagnosis not present

## 2023-08-18 DIAGNOSIS — M25551 Pain in right hip: Secondary | ICD-10-CM | POA: Diagnosis not present

## 2023-08-18 DIAGNOSIS — Z853 Personal history of malignant neoplasm of breast: Secondary | ICD-10-CM | POA: Diagnosis not present

## 2023-08-18 DIAGNOSIS — F445 Conversion disorder with seizures or convulsions: Secondary | ICD-10-CM | POA: Diagnosis not present

## 2023-08-18 DIAGNOSIS — C3491 Malignant neoplasm of unspecified part of right bronchus or lung: Secondary | ICD-10-CM | POA: Diagnosis not present

## 2023-08-18 DIAGNOSIS — R4182 Altered mental status, unspecified: Secondary | ICD-10-CM | POA: Diagnosis not present

## 2023-08-18 DIAGNOSIS — R451 Restlessness and agitation: Secondary | ICD-10-CM | POA: Diagnosis not present

## 2023-08-18 DIAGNOSIS — R079 Chest pain, unspecified: Secondary | ICD-10-CM | POA: Diagnosis not present

## 2023-08-18 DIAGNOSIS — G319 Degenerative disease of nervous system, unspecified: Secondary | ICD-10-CM | POA: Diagnosis not present

## 2023-08-18 DIAGNOSIS — S0990XA Unspecified injury of head, initial encounter: Secondary | ICD-10-CM | POA: Diagnosis not present

## 2023-08-18 DIAGNOSIS — E785 Hyperlipidemia, unspecified: Secondary | ICD-10-CM | POA: Diagnosis not present

## 2023-08-18 DIAGNOSIS — Z20822 Contact with and (suspected) exposure to covid-19: Secondary | ICD-10-CM | POA: Diagnosis not present

## 2023-08-18 DIAGNOSIS — R569 Unspecified convulsions: Secondary | ICD-10-CM | POA: Diagnosis not present

## 2023-08-18 DIAGNOSIS — I6782 Cerebral ischemia: Secondary | ICD-10-CM | POA: Diagnosis not present

## 2023-08-18 DIAGNOSIS — I214 Non-ST elevation (NSTEMI) myocardial infarction: Secondary | ICD-10-CM | POA: Diagnosis not present

## 2023-08-18 DIAGNOSIS — F418 Other specified anxiety disorders: Secondary | ICD-10-CM | POA: Diagnosis not present

## 2023-08-18 DIAGNOSIS — Z043 Encounter for examination and observation following other accident: Secondary | ICD-10-CM | POA: Diagnosis not present

## 2023-08-18 DIAGNOSIS — S0081XA Abrasion of other part of head, initial encounter: Secondary | ICD-10-CM | POA: Diagnosis not present

## 2023-08-18 DIAGNOSIS — Z79890 Hormone replacement therapy (postmenopausal): Secondary | ICD-10-CM | POA: Diagnosis not present

## 2023-08-18 DIAGNOSIS — F01B2 Vascular dementia, moderate, with psychotic disturbance: Secondary | ICD-10-CM | POA: Diagnosis not present

## 2023-08-18 DIAGNOSIS — I619 Nontraumatic intracerebral hemorrhage, unspecified: Secondary | ICD-10-CM | POA: Diagnosis not present

## 2023-08-18 DIAGNOSIS — R059 Cough, unspecified: Secondary | ICD-10-CM | POA: Diagnosis not present

## 2023-08-18 DIAGNOSIS — D649 Anemia, unspecified: Secondary | ICD-10-CM | POA: Diagnosis not present

## 2023-08-18 DIAGNOSIS — I10 Essential (primary) hypertension: Secondary | ICD-10-CM | POA: Diagnosis not present

## 2023-08-18 DIAGNOSIS — I2510 Atherosclerotic heart disease of native coronary artery without angina pectoris: Secondary | ICD-10-CM | POA: Diagnosis not present

## 2023-08-18 DIAGNOSIS — R1312 Dysphagia, oropharyngeal phase: Secondary | ICD-10-CM | POA: Diagnosis not present

## 2023-08-18 DIAGNOSIS — C3492 Malignant neoplasm of unspecified part of left bronchus or lung: Secondary | ICD-10-CM | POA: Diagnosis not present

## 2023-08-18 DIAGNOSIS — F419 Anxiety disorder, unspecified: Secondary | ICD-10-CM | POA: Diagnosis not present

## 2023-08-18 DIAGNOSIS — R262 Difficulty in walking, not elsewhere classified: Secondary | ICD-10-CM | POA: Diagnosis not present

## 2023-08-18 DIAGNOSIS — M6259 Muscle wasting and atrophy, not elsewhere classified, multiple sites: Secondary | ICD-10-CM | POA: Diagnosis not present

## 2023-08-18 DIAGNOSIS — I739 Peripheral vascular disease, unspecified: Secondary | ICD-10-CM | POA: Diagnosis not present

## 2023-08-18 DIAGNOSIS — R918 Other nonspecific abnormal finding of lung field: Secondary | ICD-10-CM | POA: Diagnosis not present

## 2023-08-18 DIAGNOSIS — W19XXXA Unspecified fall, initial encounter: Secondary | ICD-10-CM | POA: Diagnosis not present

## 2023-08-18 DIAGNOSIS — F32A Depression, unspecified: Secondary | ICD-10-CM | POA: Diagnosis not present

## 2023-08-18 DIAGNOSIS — R0902 Hypoxemia: Secondary | ICD-10-CM | POA: Diagnosis not present

## 2023-08-18 DIAGNOSIS — F064 Anxiety disorder due to known physiological condition: Secondary | ICD-10-CM | POA: Diagnosis not present

## 2023-08-18 LAB — BASIC METABOLIC PANEL
Anion gap: 8 (ref 5–15)
BUN: 23 mg/dL (ref 8–23)
CO2: 33 mmol/L — ABNORMAL HIGH (ref 22–32)
Calcium: 8.7 mg/dL — ABNORMAL LOW (ref 8.9–10.3)
Chloride: 101 mmol/L (ref 98–111)
Creatinine, Ser: 0.73 mg/dL (ref 0.44–1.00)
GFR, Estimated: >60 mL/min (ref >60)
Glucose, Bld: 103 mg/dL — ABNORMAL HIGH (ref 70–99)
Potassium: 4.2 mmol/L (ref 3.5–5.1)
Sodium: 142 mmol/L (ref 135–145)

## 2023-08-18 LAB — CBC
HCT: 28.1 % — ABNORMAL LOW (ref 36.0–46.0)
Hemoglobin: 8.2 g/dL — ABNORMAL LOW (ref 12.0–15.0)
MCH: 27.7 pg (ref 26.0–34.0)
MCHC: 29.2 g/dL — ABNORMAL LOW (ref 30.0–36.0)
MCV: 94.9 fL (ref 80.0–100.0)
Platelets: 283 10*3/uL (ref 150–400)
RBC: 2.96 MIL/uL — ABNORMAL LOW (ref 3.87–5.11)
RDW: 17.6 % — ABNORMAL HIGH (ref 11.5–15.5)
WBC: 10.5 10*3/uL (ref 4.0–10.5)
nRBC: 0 % (ref 0.0–0.2)

## 2023-08-18 MED ORDER — TOPIRAMATE 50 MG PO TABS: 50.0000 mg | ORAL_TABLET | Freq: Two times a day (BID) | ORAL | Status: AC

## 2023-08-18 MED ORDER — DOCUSATE SODIUM 100 MG PO CAPS: 100.0000 mg | ORAL_CAPSULE | Freq: Two times a day (BID) | ORAL | Status: AC | PRN

## 2023-08-18 MED ORDER — IPRATROPIUM-ALBUTEROL 0.5-2.5 (3) MG/3ML IN SOLN: 3.0000 mL | RESPIRATORY_TRACT | Status: AC | PRN

## 2023-08-18 MED ORDER — ENSURE ENLIVE PO LIQD: 237.0000 mL | Freq: Two times a day (BID) | ORAL | Status: AC

## 2023-08-18 MED ORDER — ADULT MULTIVITAMIN W/MINERALS CH: 1.0000 | ORAL_TABLET | Freq: Every day | ORAL | Status: AC

## 2023-08-18 NOTE — TOC Transition Note (Addendum)
Transition of Care [facility]) - Discharge Note   Patient Details  Name: Kendra Watts MRN: [id number] Date of Birth: [birth year]  Transition of Care [facility]) CM/SW Contact:  Zoeann Mol A [place], Theresia Majors Phone Number: 08/18/2023, 11:02 AM   Clinical Narrative:     Patient will DC to: [facility]  Anticipated DC date: 08/18/23  Family notified: [name]  Transport by: Sharin Mons     Per MD patient ready for DC to [facility] . RN, patient, patient's family, and facility notified of DC. Discharge Summary and FL2 sent to facility. RN to call report prior to discharge (Room 206, [phone]). CSW made referral to [facility], following pt with palliative services to the facility. DC packet on chart. Ambulance transport requested for patient.     CSW will sign off for now as social work intervention is no longer needed. Please consult [place] again if new needs arise.   Final next level of care: Skilled Nursing Facility Barriers to Discharge: Barriers Resolved   Patient Goals and CMS Choice   CMS Medicare.gov Compare Post Acute Care list provided to:: Patient Represenative (must comment) Choice offered to / list presented to : Sibling      Discharge Placement              Patient [facility] bed at: [facility] Patient to be transferred to facility by: [facility] Name of family member notified: [name] Patient and family notified of of transfer: 08/18/23  Discharge Plan and Services Additional resources added to the After Visit Summary for   In-house Referral: Clinical Social Work   Post Acute Care Choice: Skilled Nursing Facility                               Social Drivers of Health (SDOH) Interventions SDOH Screenings   Food Insecurity: No Food Insecurity (07/12/2023)  Housing: Low Risk  (07/12/2023)  Transportation Needs: No Transportation Needs (07/12/2023)  Utilities: Not At Risk (07/12/2023)  Alcohol Screen: Low Risk  (06/20/2020)  Depression (PHQ2-9):  Low Risk  (06/20/2020)  Financial Resource Strain: Low Risk  (06/20/2020)  Physical Activity: Insufficiently Active (06/20/2020)  Social Connections: Moderately Isolated (06/20/2020)  Stress: No Stress Concern Present (06/20/2020)  Tobacco Use: High Risk (08/08/2023)     Readmission Risk Interventions    07/14/2023   12:45 PM  Readmission Risk Prevention Plan  Transportation Screening Complete  PCP or Specialist Appt within 5-7 Days Complete  Home Care Screening Complete  Medication Review (RN CM) Complete

## 2023-08-18 NOTE — Progress Notes (Signed)
Patient ID: Kendra Watts, female   DOB: 11/28/1945, 78 y.o.   MRN: [id number]    Progress Note from the Palliative Medicine Team at [facility]   Patient Name: Kendra Watts        Date: 08/18/2023 DOB: [birth year]  Age: 78 y.o. MRN#: [id number] Attending Physician: Joycelyn Das, MD Primary Care Physician: Georgianne Fick, MD Admit Date: 08/08/2023   Reason for Consultation/Follow-up   Establishing Goals of Care   HPI/ Brief Hospital Review  78 y.o. female  with past medical history of HTN, HLD, COPD, CHF, breast cancer with lumpectomy September 01, 2015, lung cancer with radiation August 31, 2017, PAD with renal artery stenosis s/p renal artery stenting, and hemorrhagic CVA 10/2021 admitted on 08/08/2023 after bafter being found unresponsive in respiratory distress at [facility].   Pt bagged enroute and intubated in ED and extubated same day.    Rapid response called for episode of unresponsiveness 08/07/22.  Hospitalization complicated by agitation and confusion.  Complication now with low hemoglobin, GI consulted, further work-up important discussion within GOCs.  Patient without medical decision making capacity.  Family face treatment option decisions, advanced directive decisions and anticipatory care needs.   Subjective  Extensive chart review has been completed prior to meeting with patient/family  including labs, vital signs, imaging, progress/consult notes, orders, medications and available advance directive documents.    This NP assessed patient at the bedside as a follow up for palliative medicine needs and emotional support.  Initial palliative consult 08/09/2023  Patient remains intermittently pleasantly confused but is more alert over the past few days .      Patient does not have medical decision-making capacity at this time.   Spoke with sister/HPOA/ Kendra Watts by phone for ongoing conversation regarding current medical situation.  There is no HPOA document in [place] however  Kendra Watts and patient's nephew are next of kin and decision makers at this time.  Education offered on patient's multiple comorbidities (COPD, CHF, cancer history, CVA and acute respiratory failure resulting in this hospitalization.and overall failure to thrive).   Now with GI complications.  Education offered on the limitations of medical interventions to prolong quality of life when the body fails to thrive.  Patient  has improved during this hospital stay but she is high risk for decompensation, long term prognosis is poor.       Her sister verbalizes an understanding of this. Education offered on hospice benefit and self referral.   Again discussed patient's Declaration for a Natural Death, signed in 08-31-2012 and the importance of consideration of this when making medical decisions.  Plan of Care: -DNR/DNI - treat the treatable and hope for continued improvement .  -SNF for short term rehab - recommend OP Palliative Medicine at facility    Questions and concerns addressed   Discussed with primary team and nursing staff   Time: 50  minutes  Detailed review of medical records ( labs, imaging, vital signs), medically appropriate exam ( MS, skin, cardiac,  resp)   discussed with treatment team, counseling and education to patient, family, staff, documenting clinical information, medication management, coordination of care    Lorinda Creed NP  Palliative Medicine Team Team Phone # [phone] Pager [phone]

## 2023-08-18 NOTE — Progress Notes (Signed)
Report called to [place] , From [place] , all questions answered

## 2023-08-18 NOTE — Progress Notes (Addendum)
Mobility Specialist: Progress Note   08/18/23 1226  Mobility  Activity Ambulated with assistance in hallway  Level of Assistance Contact guard assist, steadying assist  Assistive Device Front wheel walker  Distance Ambulated (ft) 360 ft  Activity Response Tolerated well  Mobility Referral Yes  Mobility visit 1 Mobility  Mobility Specialist Start Time (ACUTE ONLY) 0850  Mobility Specialist Stop Time (ACUTE ONLY) 0910  Mobility Specialist Time Calculation (min) (ACUTE ONLY) 20 min    Pt was agreeable to mobility session - received in bed. No complaints. Cg throughout. SpO2 maintained >90% 3LO2 throughout. Took 1x seated break d/t fatigue. Returned to room without fault. Left in bed with all needs met, call bell in reach.   Maurene Capes Mobility Specialist Please contact via SecureChat or Rehab office at [phone]

## 2023-08-18 NOTE — Progress Notes (Signed)
Pt sister [name] called to inform her about pt discharge to [facility]. Sister was already aware of discharge. All questions answered

## 2023-08-18 NOTE — Discharge Summary (Addendum)
Physician Discharge Summary  Kendra Watts:[id number] DOB: 09-May-1946 DOA: 08/08/2023  PCP: Georgianne Fick, MD  Admit date: 08/08/2023 Discharge date: 08/18/2023  Admitted From: Skilled nursing facility  Discharge disposition: Skilled nursing facility  Recommendations for Outpatient Follow-Up:   Follow up with your primary care provider skilled nursing facility in 3 to 5 days. Check CBC, BMP, magnesium in the next visit Patient would benefit from palliative care services at skilled facility.  Discharge Diagnosis:   Principal Problem:   AMS (altered mental status)   Discharge Condition: Improved.  Diet recommendation: Mechanical soft diet.  Wound care: None.  Code status: DNR   History of Present Illness:   78 year old female with past medical history of O2 dependent COPD, lung cancer with prior SBRT, failure to thrive, severe protein calorie malnutrition, prior seizures, peripheral artery disease, chronic heart failure with preserved ejection fraction, hypertension, hypothyroidism presented presented to hospital after being found obtunded at the skilled nursing facility. Patient was intubated in the ED and was admitted in ICU. Subsequently, she was extubated and transferred to the medical floor. On the floor she was noted to have episodes of staring into the space along with getting mute. Neurology was consulted. She was put on long-term EEG on 1/17 which was negative. Given her prior left occipital hemorrhage she was considered to be high risk for seizures so was given load of valproic acid and Keppra.    Hospital Course:   Following conditions were addressed during hospitalization as listed below,  Acute metabolic encephalopathy/history of seizures, delirium. Resolved at this time.  No more agitation.  Neurology was consulted during hospitalization.  Long-term EEG monitoring was done which was negative so discontinued.  MRI of the brain was negative for acute  findings..  On Keppra which will be continued including Topamax.     Acute on chronic anemia with melena During hospitalization, hemoglobin dropped to 6.5 and required 1 unit of PRBC. FOBT was positive.  GI was notified.   Seen by GI and has been signed off since patient is not a good candidate for aggressive  intervention.  Recommendation is to continue with Protonix.  Latest hemoglobin of 8.2 and has remained stable.  Acute on chronic hypoxemic and hypercarbic respiratory failure History of oxygen dependent COPD, question aspiration.  Has completed Zosyn.  Patient received Pulmicort and Yupelri and Brovana during hospitalization.  Will be resumed on her home inhalers.  Currently on 3 L of oxygen by nasal cannula.   Essential Hypertension:  on amlodipine and Lopressor, Cardura as outpatient.  Tolerating okay.  Had hypotension which has resolved at this time.  Off pressors.  Will be resumed on home medication regimen.   Hypothyroidism: Continue Synthroid.    Hypokalemia: Improved after repletion.  Latest potassium of 4.2   Recent history of Non-STEMI:  Was initially on heparin drip which was subsequently discontinued.  Currently on metoprolol and aspirin.  This will be continued on discharge.   Pulmonary nodule:  1.6 cm superior segment of the left lower lobe.  Follows up with Dr. Delton Coombes, pulmonary as outpatient.   Plan was to repeat another CT chest in 6 months.  Plan is also to consider palliative care as outpatient.    Chronic HFpEF   2D echocardiogram from 4/23 with LV ejection fraction of 55 to 60% with diastolic dysfunction.  Low-dose Lasix daily on discharge.    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 Continue crestor  PAD: Extensive PAD history with renal artery stenosis status post bilateral renal artery stenting, carotid disease status post right CEA, mild lower extremity arterial disease treated medically.  Continue  aspirin and statins.   Debility deconditioning.  Patient has been seen by physical therapy and recommend skilled nursing facility.   Goals of care.  Palliative care Dr Imogene Burn during hospitalization.  Patient is DNR.  Overall prognosis of the patient is guarded.  Palliative care followed up with the family and plan is for skilled nursing facility with palliative care on discharge.  Disposition.  At this time, patient is stable for disposition to skilled nursing facility with palliative care  Medical Consultants:   Palliative care Neurology GI  Procedures:    Long-term EEG PRBC transfusion 2 unit Intubation, mechanical ventilation and extubation.  Subjective:   Today, patient was seen and examined at bedside.  Had not had a bowel movement overnight.  Denies any pain, nausea, vomiting, fever, chills or rigor.  Denies any shortness of breath but has mild cough..  Discharge Exam:   Vitals:   08/18/23 0823 08/18/23 0949  BP:  (!) 138/55  Pulse:  62  Resp:    Temp:    SpO2: 100%    Vitals:   08/18/23 0759 08/18/23 0821 08/18/23 0823 08/18/23 0949  BP: (!) 138/55   (!) 138/55  Pulse: 61   62  Resp: 17     Temp: (!) 97.3 F (36.3 C)     TempSrc:      SpO2: 100% 100% 100%   Weight:      Height:       Body mass index is 16.15 kg/m.   General: Alert awake, not in obvious distress thinly built, chronically ill, elderly female, appears frail, on nasal cannula oxygen HENT: pupils equally reacting to light,  No scleral pallor or icterus noted. Oral mucosa is moist.  Chest:    Diminished breath sounds bilaterally. No crackles or wheezes.  CVS: S1 &S2 heard. No murmur.  Regular rate and rhythm. Abdomen: Soft, nontender, nondistended.  Bowel sounds are heard.   Extremities: No cyanosis, clubbing or edema.  Peripheral pulses are palpable. Psych: Alert, awake and Communicative. CNS:  No cranial nerve deficits.  Denies weakness noted.  Thin extremities. Skin: Warm and dry.  No  rashes noted.  The results of significant diagnostics from this hospitalization (including imaging, microbiology, ancillary and laboratory) are listed below for reference.     Diagnostic Studies:   DG Chest Port 1 View Result Date: 08/09/2023 CLINICAL DATA:  Intubation EXAM: PORTABLE CHEST 1 VIEW COMPARISON:  Yesterday FINDINGS: Interval tracheal and esophageal extubation. Bands of opacity with fiducial markers over the upper lobes, volume loss and density greater on the right. Noted comments about possible metastatic disease on 07/11/2023 chest CT. Hyperinflation and interstitial coarsening bilaterally. Stable heart size and mediastinal contours given the pronounced degree of rotation. IMPRESSION: Stable aeration after extubation. Electronically Signed   By: Tiburcio Pea M.D.   On: 08/09/2023 06:06   Overnight EEG with video Result Date: 08/09/2023 Charlsie Quest, MD     08/10/2023 10:26 AM Patient Name: Kendra Watts MRN: [id number] Epilepsy Attending: Charlsie Quest Referring Physician/Provider: Erick Blinks, MD Duration: 08/08/2023 2356 to 08/09/2023 2356 Patient history: 78yo F with ams getting eeg to evaluate for seizure  Level of alertness: Awake/lethargic, asleep  AEDs during EEG study: LEV  Technical aspects: This EEG study was done with scalp electrodes positioned according to the 10-20 International system  of electrode placement. Electrical activity was reviewed with band pass filter of 1-70Hz , sensitivity of 7 uV/mm, display speed of 43mm/sec with a 60Hz  notched filter applied as appropriate. EEG data were recorded continuously and digitally stored.  Video monitoring was available and reviewed as appropriate.  Description:  No clear posterior dominant rhythm was seen. Sleep was characterized by sleep spindles (12 to 14 Hz), maximal frontocentral region. EEG showed continuous generalized predominantly 5 to 7 Hz theta slowing admixed with intermittent generalized 2-3Hz  delta slowing,  at times with triphasic morphology. Hyperventilation and photic stimulation were not performed.    ABNORMALITY - Continuous slow, generalized  IMPRESSION: This study is suggestive of moderate diffuse encephalopathy. No seizures or epileptiform discharges were seen throughout the recording.  Charlsie Quest   EEG adult Result Date: 08/08/2023 Charlsie Quest, MD     08/08/2023  2:08 PM Patient Name: TALISE SLIGH MRN: [id number] Epilepsy Attending: Charlsie Quest Referring Physician/Provider: Lorin Glass, MD Date: 08/07/2022 Duration: 26.10 mins Patient history: 78yo F with ams getting eeg to evaluate for seizure Level of alertness: Awake AEDs during EEG study: LEV Technical aspects: This EEG study was done with scalp electrodes positioned according to the 10-20 International system of electrode placement. Electrical activity was reviewed with band pass filter of 1-70Hz , sensitivity of 7 uV/mm, display speed of 27mm/sec with a 60Hz  notched filter applied as appropriate. EEG data were recorded continuously and digitally stored.  Video monitoring was available and reviewed as appropriate. Description: EEG showed continuous generalized predominantly 5 to 7 Hz theta slowing admixed with intermittent generalized 2-3Hz  delta slowing. Hyperventilation and photic stimulation were not performed.   ABNORMALITY - Continuous slow, generalized IMPRESSION: This study is suggestive of moderate diffuse encephalopathy. No seizures or epileptiform discharges were seen throughout the recording. Charlsie Quest   CT Head Wo Contrast Result Date: 08/08/2023 CLINICAL DATA:  Provided history: Mental status change, unknown cause. EXAM: CT HEAD WITHOUT CONTRAST TECHNIQUE: Contiguous axial images were obtained from the base of the skull through the vertex without intravenous contrast. RADIATION DOSE REDUCTION: This exam was performed according to the departmental dose-optimization program which includes automated exposure  control, adjustment of the mA and/or kV according to patient size and/or use of iterative reconstruction technique. COMPARISON:  Head CT 07/12/2023.  Brain MRI 11/13/2021. FINDINGS: Brain: Generalized cerebral atrophy. Patchy and ill-defined hypoattenuation within the cerebral white matter, nonspecific but compatible with moderate chronic small vessel ischemic disease. There is no acute intracranial hemorrhage. No demarcated cortical infarct. No extra-axial fluid collection. No evidence of an intracranial mass. No midline shift. Vascular: No hyperdense vessel.  Atherosclerotic calcifications. Skull: No calvarial fracture or aggressive osseous lesion. Sinuses/Orbits: No mass or acute finding within the imaged orbits. Mild mucosal thickening within the bilateral ethmoid, sphenoid and maxillary sinuses. IMPRESSION: 1. No evidence of an acute intracranial abnormality. 2. Parenchymal atrophy and chronic small vessel ischemic disease. 3. Mild paranasal sinus mucosal thickening. Electronically Signed   By: Jackey Loge D.O.   On: 08/08/2023 10:44   DG Chest Portable 1 View Result Date: 08/08/2023 CLINICAL DATA:  Status post intubation EXAM: PORTABLE CHEST 1 VIEW COMPARISON:  X-ray 07/11/2023 FINDINGS: ET tube in place with tip proximally 5 cm above the carina. Enteric tube with tip extending beneath the diaphragm. Overlapping cardiac leads and defibrillator pads. Stable cardiopericardial silhouette with calcified aorta. Persistent bilateral apical nodular and bandlike opacities with surgical clips. Also clips in the left axillary region. There is however diffuse increasing  interstitial changes seen of the lungs. Question tiny right effusion. No pneumothorax. IMPRESSION: Developing interstitial changes seen of the lungs. Acute process is possible. Tiny right effusion. Hyperinflation with stable parenchymal opacity seen along the upper lung zones with a bandlike changes and nodularity and surgical changes. Please  correlate with history. ET tube and enteric tube in place Electronically Signed   By: Karen Kays M.D.   On: 08/08/2023 10:30     Labs:   Basic Metabolic Panel: Recent Labs  Lab 08/12/23 0542 08/13/23 0548 08/14/23 0704 08/15/23 0648 08/16/23 0726 08/18/23 0649  NA 142 145 143 145 143 142  K 3.1* 4.3 4.0 4.0 4.1 4.2  CL 94* 99 100 104 102 101  CO2 42* 37* 35* 34* 34* 33*  GLUCOSE 89 103* 102* 96 101* 103*  BUN 14 16 17 20 18 23   CREATININE 0.79 0.85 0.74 0.98 1.10* 0.73  CALCIUM 8.6* 8.9 8.9 8.8* 8.7* 8.7*  MG 2.0 2.0 2.1  --   --   --   PHOS 2.5 2.8  --   --   --   --    GFR Estimated Creatinine Clearance: 34.9 mL/min (by C-G formula based on SCr of 0.73 mg/dL). Liver Function Tests: No results for input(s): "AST", "ALT", "ALKPHOS", "BILITOT", "PROT", "ALBUMIN" in the last 168 hours. No results for input(s): "LIPASE", "AMYLASE" in the last 168 hours. No results for input(s): "AMMONIA" in the last 168 hours. Coagulation profile No results for input(s): "INR", "PROTIME" in the last 168 hours.  CBC: Recent Labs  Lab 08/12/23 0542 08/13/23 0548 08/14/23 0704 08/14/23 1756 08/15/23 0648 08/16/23 0726 08/18/23 0649  WBC 11.7* 11.4* 10.1  --   --  11.4* 10.5  NEUTROABS  --   --  7.0  --   --   --   --   HGB 8.3* 7.5* 7.0* 9.0* 8.5* 8.1* 8.2*  HCT 27.6* 26.1* 23.9* 29.5* 28.0* 26.9* 28.1*  MCV 91.4 92.9 93.4  --   --  93.7 94.9  PLT 275 289 273  --   --  258 283   Cardiac Enzymes: No results for input(s): "CKTOTAL", "CKMB", "CKMBINDEX", "TROPONINI" in the last 168 hours. BNP: Invalid input(s): "POCBNP" CBG: No results for input(s): "GLUCAP" in the last 168 hours. D-Dimer No results for input(s): "DDIMER" in the last 72 hours. Hgb A1c No results for input(s): "HGBA1C" in the last 72 hours. Lipid Profile No results for input(s): "CHOL", "HDL", "LDLCALC", "TRIG", "CHOLHDL", "LDLDIRECT" in the last 72 hours. Thyroid function studies No results for input(s):  "TSH", "T4TOTAL", "T3FREE", "THYROIDAB" in the last 72 hours.  Invalid input(s): "FREET3" Anemia work up No results for input(s): "VITAMINB12", "FOLATE", "FERRITIN", "TIBC", "IRON", "RETICCTPCT" in the last 72 hours. Microbiology Recent Results (from the past 240 hours)  Blood culture (routine x 2)     Status: None   Collection Time: 08/08/23 10:47 AM   Specimen: BLOOD LEFT ARM  Result Value Ref Range Status   Specimen Description BLOOD LEFT ARM  Final   Special Requests   Final    BOTTLES DRAWN AEROBIC AND ANAEROBIC Blood Culture results may not be optimal due to an inadequate volume of blood received in culture bottles   Culture   Final    NO GROWTH 5 DAYS Performed at [facility], [address], [place], [place] [address]    Report Status 08/13/2023 FINAL  Final  Resp panel by RT-PCR (RSV, Flu A&B, Covid) Anterior Nasal Swab  Status: None   Collection Time: 08/08/23 10:48 AM   Specimen: Anterior Nasal Swab  Result Value Ref Range Status   SARS Coronavirus 2 by RT PCR NEGATIVE NEGATIVE Final   Influenza A by PCR NEGATIVE NEGATIVE Final   Influenza B by PCR NEGATIVE NEGATIVE Final    Comment: (NOTE) The Xpert Xpress SARS-CoV-2/FLU/RSV plus assay is intended as an aid in the diagnosis of influenza from Nasopharyngeal swab specimens and should not be used as a sole basis for treatment. Nasal washings and aspirates are unacceptable for Xpert Xpress SARS-CoV-2/FLU/RSV testing.  Fact Sheet for Patients: BloggerCourse.com  Fact Sheet for Healthcare Providers: SeriousBroker.it  This test is not yet approved or cleared by the [place] FDA and has been authorized for detection and/or diagnosis of SARS-CoV-2 by FDA under an Emergency Use Authorization (EUA). This EUA will remain in effect (meaning this test can be used) for the duration of the COVID-19 declaration under Section 564(b)(1) of the Act, 21 U.S.C. section  360bbb-3(b)(1), unless the authorization is terminated or revoked.     Resp Syncytial Virus by PCR NEGATIVE NEGATIVE Final    Comment: (NOTE) Fact Sheet for Patients: BloggerCourse.com  Fact Sheet for Healthcare Providers: SeriousBroker.it  This test is not yet approved or cleared by the [place] FDA and has been authorized for detection and/or diagnosis of SARS-CoV-2 by FDA under an Emergency Use Authorization (EUA). This EUA will remain in effect (meaning this test can be used) for the duration of the COVID-19 declaration under Section 564(b)(1) of the Act, 21 U.S.C. section 360bbb-3(b)(1), unless the authorization is terminated or revoked.  Performed at [facility], [address], [place], [place] [address]   Blood culture (routine x 2)     Status: None   Collection Time: 08/08/23 12:10 PM   Specimen: BLOOD  Result Value Ref Range Status   Specimen Description BLOOD SITE NOT SPECIFIED  Final   Special Requests   Final    BOTTLES DRAWN AEROBIC AND ANAEROBIC Blood Culture results may not be optimal due to an inadequate volume of blood received in culture bottles   Culture   Final    NO GROWTH 5 DAYS Performed at [facility], [address], [place], [place] [address]    Report Status 08/13/2023 FINAL  Final  MRSA Next Gen by PCR, Nasal     Status: None   Collection Time: 08/08/23 12:48 PM   Specimen: Nasal Mucosa; Nasal Swab  Result Value Ref Range Status   MRSA by PCR Next Gen NOT DETECTED NOT DETECTED Final    Comment: (NOTE) The GeneXpert MRSA Assay (FDA approved for NASAL specimens only), is one component of a comprehensive MRSA colonization surveillance program. It is not intended to diagnose MRSA infection nor to guide or monitor treatment for MRSA infections. Test performance is not FDA approved in patients less than 34 years old. Performed at [facility], [address],  [place], [place] [address]      Discharge Instructions:   Discharge Instructions     Call MD for:  redness, tenderness, or signs of infection (pain, swelling, redness, odor or green/yellow discharge around incision site)   Complete by: As directed    Call MD for:  severe uncontrolled pain   Complete by: As directed    Call MD for:  temperature >100.4   Complete by: As directed    Discharge instructions   Complete by: As directed    Follow-up with  your primary care provider at the skilled nursing facility in 3 to 5 days.  Check blood work at that time.  Seek medical attention for worsening symptoms.   Increase activity slowly   Complete by: As directed       Allergies as of 08/18/2023   No Known Allergies      Medication List     TAKE these medications    aspirin 81 MG tablet Take 1 tablet (81 mg total) by mouth daily.   Breztri Aerosphere 160-9-4.8 MCG/ACT Aero Generic drug: Budeson-Glycopyrrol-Formoterol Inhale 2 puffs into the lungs in the morning and at bedtime.   docusate sodium 100 MG capsule Commonly known as: COLACE Take 1 capsule (100 mg total) by mouth 2 (two) times daily as needed for mild constipation.   doxazosin 8 MG tablet Commonly known as: CARDURA Take 8 mg by mouth daily.   feeding supplement Liqd Take 237 mLs by mouth 2 (two) times daily between meals.   furosemide 20 MG tablet Commonly known as: LASIX Take 20 mg by mouth daily.   ipratropium-albuterol 0.5-2.5 (3) MG/3ML Soln Commonly known as: DUONEB Take 3 mLs by nebulization every 4 (four) hours as needed (shortness of breath wheezing).   levETIRAcetam 500 MG tablet Commonly known as: KEPPRA Take 1 tablet (500 mg total) by mouth 2 (two) times daily.   levothyroxine 88 MCG tablet Commonly known as: SYNTHROID Take 88 mcg by mouth daily before breakfast.   metoprolol succinate 50 MG 24 hr tablet Commonly known as: TOPROL-XL Take 1 tablet (50 mg total) by mouth daily. Take with or  immediately following a meal.   multivitamin with minerals Tabs tablet Take 1 tablet by mouth daily. Start taking on: August 19, 2023   OXYGEN Inhale 2-4 L into the lungs as needed. As needed to maintain SATS > 90%   pantoprazole 40 MG tablet Commonly known as: PROTONIX Take 1 tablet (40 mg total) by mouth daily.   rosuvastatin 10 MG tablet Commonly known as: CRESTOR Take 10 mg by mouth daily.   topiramate 50 MG tablet Commonly known as: TOPAMAX Take 1 tablet (50 mg total) by mouth 2 (two) times daily.          Time coordinating discharge: 39 minutes  Signed:  Ninel Abdella  [facility] 08/18/2023, 10:25 AM

## 2023-08-18 NOTE — Consult Note (Signed)
[facility] Liaison Consult Note   08/18/2023  GEANNA DIVIRGILIO [birth year] [id number]   Primary Care Provider:  Georgianne Fick, MD with [facility]  Insurance: Medicare ACO REACH  Patient was reviewed for less than 30 days readmission with extreme high risk score with a 10-day length of stay for post hospital barriers to care.  Patient was screened for hospitalization and on behalf of [facility]  Care Coordination to assess for post hospital community care needs.  Patient is being considered for a skilled nursing facility level of care for post hospital transition.  If the patient goes to a [facility] affiliated facility then, patient can be followed by [facility] RN with traditional Medicare and approved Medicare Advantage plans.    Plan:  Currently, electronic medical record shows patient with an affiliated facility, then will notify the [facility] Community [facility] RN can follow for any known or needs for transitional care needs for returning to post facility care coordination needs to return to community.  For questions or referrals, please contact:  Charlesetta Shanks, RN, BSN, CCM [place]  [facility] Liaison Direct Dial: [phone] or secure chat Email: [email or web address]

## 2023-08-18 NOTE — Plan of Care (Signed)

## 2023-08-18 NOTE — Progress Notes (Signed)
Called [facility] to give report, RN unable to take report at this time, Name and number provided for call back when ready

## 2023-08-19 DIAGNOSIS — J441 Chronic obstructive pulmonary disease with (acute) exacerbation: Secondary | ICD-10-CM | POA: Diagnosis not present

## 2023-08-19 DIAGNOSIS — E039 Hypothyroidism, unspecified: Secondary | ICD-10-CM | POA: Diagnosis not present

## 2023-08-19 DIAGNOSIS — I214 Non-ST elevation (NSTEMI) myocardial infarction: Secondary | ICD-10-CM | POA: Diagnosis not present

## 2023-08-19 DIAGNOSIS — C50412 Malignant neoplasm of upper-outer quadrant of left female breast: Secondary | ICD-10-CM | POA: Diagnosis not present

## 2023-08-19 DIAGNOSIS — I69351 Hemiplegia and hemiparesis following cerebral infarction affecting right dominant side: Secondary | ICD-10-CM | POA: Diagnosis not present

## 2023-08-19 DIAGNOSIS — I701 Atherosclerosis of renal artery: Secondary | ICD-10-CM | POA: Diagnosis not present

## 2023-08-19 DIAGNOSIS — C3491 Malignant neoplasm of unspecified part of right bronchus or lung: Secondary | ICD-10-CM | POA: Diagnosis not present

## 2023-08-19 DIAGNOSIS — I5033 Acute on chronic diastolic (congestive) heart failure: Secondary | ICD-10-CM | POA: Diagnosis not present

## 2023-08-19 DIAGNOSIS — I619 Nontraumatic intracerebral hemorrhage, unspecified: Secondary | ICD-10-CM | POA: Diagnosis not present

## 2023-08-19 DIAGNOSIS — J449 Chronic obstructive pulmonary disease, unspecified: Secondary | ICD-10-CM | POA: Diagnosis not present

## 2023-08-19 DIAGNOSIS — E43 Unspecified severe protein-calorie malnutrition: Secondary | ICD-10-CM | POA: Diagnosis not present

## 2023-08-19 DIAGNOSIS — C3492 Malignant neoplasm of unspecified part of left bronchus or lung: Secondary | ICD-10-CM | POA: Diagnosis not present

## 2023-08-19 DIAGNOSIS — I6529 Occlusion and stenosis of unspecified carotid artery: Secondary | ICD-10-CM | POA: Diagnosis not present

## 2023-08-20 DIAGNOSIS — C3492 Malignant neoplasm of unspecified part of left bronchus or lung: Secondary | ICD-10-CM | POA: Diagnosis not present

## 2023-08-20 DIAGNOSIS — J449 Chronic obstructive pulmonary disease, unspecified: Secondary | ICD-10-CM | POA: Diagnosis not present

## 2023-08-20 DIAGNOSIS — I214 Non-ST elevation (NSTEMI) myocardial infarction: Secondary | ICD-10-CM | POA: Diagnosis not present

## 2023-08-20 DIAGNOSIS — C3491 Malignant neoplasm of unspecified part of right bronchus or lung: Secondary | ICD-10-CM | POA: Diagnosis not present

## 2023-08-20 DIAGNOSIS — I701 Atherosclerosis of renal artery: Secondary | ICD-10-CM | POA: Diagnosis not present

## 2023-08-20 DIAGNOSIS — I619 Nontraumatic intracerebral hemorrhage, unspecified: Secondary | ICD-10-CM | POA: Diagnosis not present

## 2023-08-20 DIAGNOSIS — J441 Chronic obstructive pulmonary disease with (acute) exacerbation: Secondary | ICD-10-CM | POA: Diagnosis not present

## 2023-08-20 DIAGNOSIS — I69351 Hemiplegia and hemiparesis following cerebral infarction affecting right dominant side: Secondary | ICD-10-CM | POA: Diagnosis not present

## 2023-08-20 DIAGNOSIS — C50412 Malignant neoplasm of upper-outer quadrant of left female breast: Secondary | ICD-10-CM | POA: Diagnosis not present

## 2023-08-20 DIAGNOSIS — I6529 Occlusion and stenosis of unspecified carotid artery: Secondary | ICD-10-CM | POA: Diagnosis not present

## 2023-08-20 DIAGNOSIS — E43 Unspecified severe protein-calorie malnutrition: Secondary | ICD-10-CM | POA: Diagnosis not present

## 2023-08-20 DIAGNOSIS — I5033 Acute on chronic diastolic (congestive) heart failure: Secondary | ICD-10-CM | POA: Diagnosis not present

## 2023-08-21 ENCOUNTER — Observation Stay (HOSPITAL_COMMUNITY)
Admission: EM | Admit: 2023-08-21 | Discharge: 2023-08-22 | Disposition: A | Discharge status: Home / Self Care | Payer: Medicare Other | Attending: Emergency Medicine | Admitting: Emergency Medicine

## 2023-08-21 ENCOUNTER — Emergency Department (HOSPITAL_COMMUNITY): Payer: Medicare Other

## 2023-08-21 DIAGNOSIS — I619 Nontraumatic intracerebral hemorrhage, unspecified: Secondary | ICD-10-CM | POA: Diagnosis not present

## 2023-08-21 DIAGNOSIS — F17210 Nicotine dependence, cigarettes, uncomplicated: Secondary | ICD-10-CM | POA: Diagnosis not present

## 2023-08-21 DIAGNOSIS — R4182 Altered mental status, unspecified: Principal | ICD-10-CM

## 2023-08-21 DIAGNOSIS — I739 Peripheral vascular disease, unspecified: Secondary | ICD-10-CM | POA: Insufficient documentation

## 2023-08-21 DIAGNOSIS — E039 Hypothyroidism, unspecified: Secondary | ICD-10-CM | POA: Insufficient documentation

## 2023-08-21 DIAGNOSIS — R0602 Shortness of breath: Secondary | ICD-10-CM | POA: Diagnosis present

## 2023-08-21 DIAGNOSIS — D500 Iron deficiency anemia secondary to blood loss (chronic): Secondary | ICD-10-CM | POA: Insufficient documentation

## 2023-08-21 DIAGNOSIS — R410 Disorientation, unspecified: Secondary | ICD-10-CM | POA: Diagnosis not present

## 2023-08-21 DIAGNOSIS — G9340 Encephalopathy, unspecified: Secondary | ICD-10-CM | POA: Diagnosis not present

## 2023-08-21 DIAGNOSIS — I701 Atherosclerosis of renal artery: Secondary | ICD-10-CM | POA: Diagnosis not present

## 2023-08-21 DIAGNOSIS — E785 Hyperlipidemia, unspecified: Secondary | ICD-10-CM | POA: Insufficient documentation

## 2023-08-21 DIAGNOSIS — Z20822 Contact with and (suspected) exposure to covid-19: Secondary | ICD-10-CM | POA: Diagnosis not present

## 2023-08-21 DIAGNOSIS — I214 Non-ST elevation (NSTEMI) myocardial infarction: Secondary | ICD-10-CM | POA: Diagnosis not present

## 2023-08-21 DIAGNOSIS — I10 Essential (primary) hypertension: Secondary | ICD-10-CM | POA: Diagnosis not present

## 2023-08-21 DIAGNOSIS — Z8673 Personal history of transient ischemic attack (TIA), and cerebral infarction without residual deficits: Secondary | ICD-10-CM

## 2023-08-21 DIAGNOSIS — J449 Chronic obstructive pulmonary disease, unspecified: Secondary | ICD-10-CM | POA: Insufficient documentation

## 2023-08-21 DIAGNOSIS — I110 Hypertensive heart disease with heart failure: Secondary | ICD-10-CM | POA: Insufficient documentation

## 2023-08-21 DIAGNOSIS — C3491 Malignant neoplasm of unspecified part of right bronchus or lung: Secondary | ICD-10-CM | POA: Diagnosis not present

## 2023-08-21 DIAGNOSIS — Z853 Personal history of malignant neoplasm of breast: Secondary | ICD-10-CM | POA: Diagnosis not present

## 2023-08-21 DIAGNOSIS — J9621 Acute and chronic respiratory failure with hypoxia: Secondary | ICD-10-CM

## 2023-08-21 DIAGNOSIS — D649 Anemia, unspecified: Secondary | ICD-10-CM | POA: Diagnosis present

## 2023-08-21 DIAGNOSIS — Z85118 Personal history of other malignant neoplasm of bronchus and lung: Secondary | ICD-10-CM | POA: Insufficient documentation

## 2023-08-21 DIAGNOSIS — I5033 Acute on chronic diastolic (congestive) heart failure: Secondary | ICD-10-CM | POA: Diagnosis not present

## 2023-08-21 DIAGNOSIS — I6782 Cerebral ischemia: Secondary | ICD-10-CM | POA: Diagnosis not present

## 2023-08-21 DIAGNOSIS — J189 Pneumonia, unspecified organism: Secondary | ICD-10-CM

## 2023-08-21 DIAGNOSIS — G40909 Epilepsy, unspecified, not intractable, without status epilepticus: Secondary | ICD-10-CM

## 2023-08-21 DIAGNOSIS — R569 Unspecified convulsions: Secondary | ICD-10-CM | POA: Insufficient documentation

## 2023-08-21 DIAGNOSIS — J441 Chronic obstructive pulmonary disease with (acute) exacerbation: Secondary | ICD-10-CM | POA: Diagnosis present

## 2023-08-21 DIAGNOSIS — J9601 Acute respiratory failure with hypoxia: Principal | ICD-10-CM | POA: Insufficient documentation

## 2023-08-21 DIAGNOSIS — I69351 Hemiplegia and hemiparesis following cerebral infarction affecting right dominant side: Secondary | ICD-10-CM | POA: Diagnosis not present

## 2023-08-21 DIAGNOSIS — I509 Heart failure, unspecified: Secondary | ICD-10-CM | POA: Diagnosis not present

## 2023-08-21 DIAGNOSIS — C50412 Malignant neoplasm of upper-outer quadrant of left female breast: Secondary | ICD-10-CM | POA: Diagnosis not present

## 2023-08-21 DIAGNOSIS — I5032 Chronic diastolic (congestive) heart failure: Secondary | ICD-10-CM | POA: Diagnosis not present

## 2023-08-21 DIAGNOSIS — I6529 Occlusion and stenosis of unspecified carotid artery: Secondary | ICD-10-CM | POA: Diagnosis not present

## 2023-08-21 DIAGNOSIS — R0902 Hypoxemia: Secondary | ICD-10-CM | POA: Diagnosis not present

## 2023-08-21 DIAGNOSIS — E43 Unspecified severe protein-calorie malnutrition: Secondary | ICD-10-CM | POA: Diagnosis not present

## 2023-08-21 DIAGNOSIS — C3492 Malignant neoplasm of unspecified part of left bronchus or lung: Secondary | ICD-10-CM | POA: Diagnosis not present

## 2023-08-21 DIAGNOSIS — R918 Other nonspecific abnormal finding of lung field: Secondary | ICD-10-CM | POA: Diagnosis not present

## 2023-08-21 LAB — BLOOD GAS, VENOUS
Acid-Base Excess: 14.2 mmol/L — ABNORMAL HIGH (ref 0.0–2.0)
Bicarbonate: 41.4 mmol/L — ABNORMAL HIGH (ref 20.0–28.0)
O2 Saturation: 81.6 %
Patient temperature: 37
pCO2, Ven: 70 mm[Hg] — ABNORMAL HIGH (ref 44–60)
pH, Ven: 7.38 (ref 7.25–7.43)
pO2, Ven: 50 mm[Hg] — ABNORMAL HIGH (ref 32–45)

## 2023-08-21 LAB — CBC WITH DIFFERENTIAL/PLATELET
Abs Immature Granulocytes: 0.04 10*3/uL (ref 0.00–0.07)
Basophils Absolute: 0.1 10*3/uL (ref 0.0–0.1)
Basophils Relative: 1 %
Eosinophils Absolute: 0.4 10*3/uL (ref 0.0–0.5)
Eosinophils Relative: 4 %
HCT: 28.2 % — ABNORMAL LOW (ref 36.0–46.0)
Hemoglobin: 8.3 g/dL — ABNORMAL LOW (ref 12.0–15.0)
Immature Granulocytes: 0 %
Lymphocytes Relative: 12 %
Lymphs Abs: 1.3 10*3/uL (ref 0.7–4.0)
MCH: 28.2 pg (ref 26.0–34.0)
MCHC: 29.4 g/dL — ABNORMAL LOW (ref 30.0–36.0)
MCV: 95.9 fL (ref 80.0–100.0)
Monocytes Absolute: 1 10*3/uL (ref 0.1–1.0)
Monocytes Relative: 10 %
Neutro Abs: 7.7 10*3/uL (ref 1.7–7.7)
Neutrophils Relative %: 73 %
Platelets: 266 10*3/uL (ref 150–400)
RBC: 2.94 MIL/uL — ABNORMAL LOW (ref 3.87–5.11)
RDW: 18 % — ABNORMAL HIGH (ref 11.5–15.5)
WBC: 10.6 10*3/uL — ABNORMAL HIGH (ref 4.0–10.5)
nRBC: 0 % (ref 0.0–0.2)

## 2023-08-21 LAB — URINALYSIS, ROUTINE W REFLEX MICROSCOPIC
Bacteria, UA: NONE SEEN
Bilirubin Urine: NEGATIVE
Glucose, UA: NEGATIVE mg/dL
Ketones, ur: NEGATIVE mg/dL
Nitrite: NEGATIVE
Protein, ur: NEGATIVE mg/dL
Specific Gravity, Urine: 1.009 (ref 1.005–1.030)
pH: 8 (ref 5.0–8.0)

## 2023-08-21 LAB — TSH: TSH: 13.634 u[IU]/mL — ABNORMAL HIGH (ref 0.350–4.500)

## 2023-08-21 LAB — BASIC METABOLIC PANEL
Anion gap: 8 (ref 5–15)
BUN: 22 mg/dL (ref 8–23)
CO2: 35 mmol/L — ABNORMAL HIGH (ref 22–32)
Calcium: 8.5 mg/dL — ABNORMAL LOW (ref 8.9–10.3)
Chloride: 99 mmol/L (ref 98–111)
Creatinine, Ser: 0.87 mg/dL (ref 0.44–1.00)
GFR, Estimated: >60 mL/min (ref >60)
Glucose, Bld: 112 mg/dL — ABNORMAL HIGH (ref 70–99)
Potassium: 4.2 mmol/L (ref 3.5–5.1)
Sodium: 142 mmol/L (ref 135–145)

## 2023-08-21 LAB — AMMONIA: Ammonia: 33 umol/L (ref 9–35)

## 2023-08-21 LAB — RESP PANEL BY RT-PCR (RSV, FLU A&B, COVID)  RVPGX2
Influenza A by PCR: NEGATIVE
Influenza B by PCR: NEGATIVE
Resp Syncytial Virus by PCR: NEGATIVE
SARS Coronavirus 2 by RT PCR: NEGATIVE

## 2023-08-21 LAB — TROPONIN I (HIGH SENSITIVITY)
Troponin I (High Sensitivity): 17 ng/L (ref <18)
Troponin I (High Sensitivity): 14 ng/L (ref <18)

## 2023-08-21 LAB — BRAIN NATRIURETIC PEPTIDE: B Natriuretic Peptide: 281.3 pg/mL — ABNORMAL HIGH (ref 0.0–100.0)

## 2023-08-21 MED ORDER — SODIUM CHLORIDE 0.9 % IV SOLN
500.0000 mg | Freq: Once | INTRAVENOUS | Status: AC
  Administered 2023-08-21: 500 mg via INTRAVENOUS
  Filled 2023-08-21: qty 5

## 2023-08-21 MED ORDER — SODIUM CHLORIDE 0.9 % IV SOLN
1.0000 g | Freq: Once | INTRAVENOUS | Status: AC
  Administered 2023-08-21: 1 g via INTRAVENOUS
  Filled 2023-08-21: qty 10

## 2023-08-21 MED ORDER — SODIUM CHLORIDE 0.9 % IV BOLUS
1000.0000 mL | Freq: Once | INTRAVENOUS | Status: AC
  Administered 2023-08-21: 1000 mL via INTRAVENOUS

## 2023-08-21 NOTE — H&P (Signed)
 History and Physical    Kendra Watts GMW:[id number] DOB: [birth year] DOA: 08/21/2023  PCP: Georgianne Fick, MD  Patient coming from: SNF  I have personally briefly reviewed patient's old medical records in [facility]  Chief Complaint: Altered mental status, shortness of breath  HPI: Kendra Watts is a 78 y.o. female with medical history significant for COPD, chronic hypoxic respiratory failure on 2-3 L O2 via [place], chronic HFpEF, PAD with renal artery stenosis (s/p stenting to left renal artery 2011 and right renal artery 2016), carotid stenosis (s/p right CEA in 2013), bilateral iliac disease, lung cancer s/p SBRT, history of left occipital stroke with hemorrhagic transformation, seizures on Keppra, HTN, HLD, hypothyroidism, recent NSTEMI December 2024 (managed medically), chronic anemia who was brought to the ED from [facility] for evaluation of altered mental status.  Patient brought to the ED from SNF for evaluation of altered mental status reportedly for the last 3 days.  Reportedly she was only oriented to self prior to arrival however at time of admitting exam she is oriented to self, situation, place, and year although she is only speaking in brief short sentences and not providing much further history.  She does report some trouble breathing but otherwise has no other complaints.  Patient with two recent admissions: 07/11/2023-07/18/2023 for NSTEMI managed medically on heparin drip.  Seen by cardiology and not considered candidate for aggressive interventions.  08/08/2023-08/18/2023 after found obtunded at her SNF.  Required intubation and admitted to ICU.  She was treated for for possible aspiration and extubated.  She underwent long-term EEG and MRI brain which were negative for acute findings.  She was continued on Keppra and Topamax.  She was discharged back to SNF on 3 L O2 via [place].  ED Course  Labs/Imaging on admission: I have personally reviewed following labs and  imaging studies.  Initial vitals showed BP 111/84, pulse 68, RR 16, temp 98.3 F, SpO2 98% on 5 L O2 via [place].  Labs showed WBC 10.6, hemoglobin 8.3, platelets 266,000, sodium 142, potassium 4.2, bicarb 35, BUN 22, creatinine 0.87, serum glucose 112, BNP 281.3, troponin 17, ammonia 33.  VBG pH 7.38, pCO2 70, pO2 50.  TSH 13.634, free T4 in process.  Keppra level in process.  UA negative for UTI.  SARS-CoV-2, influenza, RSV PCR negative.  CT head without contrast negative for acute intracranial process.  Atrophy with chronic microvascular schema changes noted.  2 view chest x-ray showed slightly increased opacities in the mid left lung and perihilar region on the right, possible edema or infiltrate.  Underlying malignancy cannot be excluded.  Suspected small right pleural effusion.  Patient was given 1 L normal saline, IV ceftriaxone and azithromycin.  The hospitalist service was consulted to admit for further evaluation and management.  Review of Systems: All systems reviewed and are negative except as documented in history of present illness above.   Past Medical History:  Diagnosis Date   Anemia    Aortic arch anomaly    arteria lusoria    Breast cancer (HCC) 09/22/2015   Malignant   Breast cancer of upper-outer quadrant of left female breast (HCC) 09/08/2015   Cataract, immature    CHF (congestive heart failure) (HCC)    Acute CHF-06/2018   COPD (chronic obstructive pulmonary disease) (HCC)    Encephalopathy acute 11/12/2021   Femur fracture, left (HCC) 04/11/2020   left femur fracture( greater trochanter   Heart murmur    states no known problems  History of cervical spine x-ray 11/13/2021   History of hyperthyroidism    History of radiation therapy    bilateral lungs - 10/27/18-11/02/18, Dr. Antony Blackbird   History of radiation therapy 11/28/2015   left breast 10/30/2015-11/28/2015   Dr Antony Blackbird   Hyperlipidemia    Hypertension    states under control with meds., has been  on med. x "long time"   Hypokalemia    from last physical.    Hypothyroidism    Nonfunctioning kidney    left   Personal history of radiation therapy    2017   Pulmonary nodules    Bilateral   Radiation 10/30/15-11/28/15   left breast 42.72 Gy, boosted to 10 Gy   Renal artery stenosis (HCC)    Tobacco abuse    Wears partial dentures    upper and lower    Past Surgical History:  Procedure Laterality Date   ABDOMINAL ANGIOGRAM  02/18/2012   Procedure: ABDOMINAL ANGIOGRAM;  Surgeon: Runell Gess, MD;  Location: [facility] CATH LAB;  Service: Cardiovascular;;   ABDOMINAL AORTAGRAM  07/04/2014   ABDOMINAL HYSTERECTOMY  ~ 1977   partial   APPENDECTOMY     ARCH AORTOGRAM     BREAST LUMPECTOMY Left 09/22/2015   Malignant   CAROTID ANGIOGRAM N/A 02/18/2012   Procedure: CAROTID ANGIOGRAM;  Surgeon: Runell Gess, MD;  Location: [facility] CATH LAB;  Service: Cardiovascular;  Laterality: N/A;   ENDARTERECTOMY  04/02/2012   Procedure: ENDARTERECTOMY CAROTID;  Surgeon: Nada Libman, MD;  Location: [facility] OR;  Service: Vascular;  Laterality: Right;   FUDUCIAL PLACEMENT Bilateral 09/02/2018   Procedure: Placement Of Fiducial to right upper lobe & left upper lobe lung;  Surgeon: Leslye Peer, MD;  Location: [facility] OR;  Service: Thoracic;  Laterality: Bilateral;   IR THORACENTESIS ASP PLEURAL SPACE W/IMG GUIDE  07/08/2018   RADIOACTIVE SEED GUIDED PARTIAL MASTECTOMY WITH AXILLARY SENTINEL LYMPH NODE BIOPSY Left 09/22/2015   Procedure: INJECT BLUE DYE LEFT BREAST,RADIOACTIVE SEED GUIDED PARTIAL MASTECTOMY WITH AXILLARY SENTINEL LYMPH NODE BIOPSY;  Surgeon: Claud Kelp, MD;  Location: [facility];  Service: General;  Laterality: Left;   RENAL ANGIOGRAM Left 06/08/2010   renal artery stent -  5x12 Genesis on Aviator balloon stent (Dr. Erlene Quan)   RENAL ANGIOGRAM Right 07/04/2014   Procedure: RENAL ANGIOGRAM;  Surgeon: Runell Gess, MD;  Location: [facility] CATH LAB;  Service: Cardiovascular;   Laterality: Right;   RENAL ANGIOGRAM Right 08/22/2014   Procedure: RENAL ANGIOGRAM;  Surgeon: Runell Gess, MD;  Location: [facility] CATH LAB;  Service: Cardiovascular;  Laterality: Right;   TONSILLECTOMY     as a child   VIDEO BRONCHOSCOPY WITH ENDOBRONCHIAL NAVIGATION N/A 09/02/2018   Procedure: VIDEO BRONCHOSCOPY WITH ENDOBRONCHIAL NAVIGATION;  Surgeon: Leslye Peer, MD;  Location: [facility] OR;  Service: Thoracic;  Laterality: N/A;    Social History:  reports that she has been smoking cigarettes. She has a 20 pack-year smoking history. She has never used smokeless tobacco. She reports current alcohol use of about 4.0 standard drinks of alcohol per week. She reports that she does not use drugs.  No Known Allergies  Family History  Problem Relation Age of Onset   Heart disease Mother        MI @ 42, died at 71   Cancer Father    Lung cancer Father    Lung cancer Sister    Breast cancer Paternal Aunt    Heart disease Maternal Grandmother  Stroke Neg Hx      Prior to Admission medications   Medication Sig Start Date End Date Taking? Authorizing Provider  aspirin 81 MG tablet Take 1 tablet (81 mg total) by mouth daily. 03/21/20   Hongalgi, Maximino Greenland, MD  Budeson-Glycopyrrol-Formoterol (BREZTRI AEROSPHERE) 160-9-4.8 MCG/ACT AERO Inhale 2 puffs into the lungs in the morning and at bedtime. 06/12/21   Leslye Peer, MD  docusate sodium (COLACE) 100 MG capsule Take 1 capsule (100 mg total) by mouth 2 (two) times daily as needed for mild constipation. 08/18/23   Pokhrel, Rebekah [place], MD  doxazosin (CARDURA) 8 MG tablet Take 8 mg by mouth daily.    [provider]  feeding supplement (ENSURE ENLIVE / ENSURE PLUS) LIQD Take 237 mLs by mouth 2 (two) times daily between meals. 08/18/23   Pokhrel, Rebekah [place], MD  furosemide (LASIX) 20 MG tablet Take 20 mg by mouth daily.    [provider]  ipratropium-albuterol (DUONEB) 0.5-2.5 (3) MG/3ML SOLN Take 3 mLs by nebulization every 4 (four) hours as  needed (shortness of breath wheezing). 08/18/23   Pokhrel, Rebekah [place], MD  levETIRAcetam (KEPPRA) 500 MG tablet Take 1 tablet (500 mg total) by mouth 2 (two) times daily. 05/20/23   Lomax, Amy, NP  levothyroxine (SYNTHROID) 88 MCG tablet Take 88 mcg by mouth daily before breakfast.    [provider]  metoprolol succinate (TOPROL-XL) 50 MG 24 hr tablet Take 1 tablet (50 mg total) by mouth daily. Take with or immediately following a meal. 07/18/23   Pokhrel, Laxman, MD  Multiple Vitamin (MULTIVITAMIN WITH MINERALS) TABS tablet Take 1 tablet by mouth daily. 08/19/23   Pokhrel, Rebekah Chester[place], MD  OXYGEN Inhale 2-4 L into the lungs as needed. As needed to maintain SATS > 90%    [provider]  pantoprazole (PROTONIX) 40 MG tablet Take 1 tablet (40 mg total) by mouth daily. 07/14/18   Darlin Drop, DO  rosuvastatin (CRESTOR) 10 MG tablet Take 10 mg by mouth daily.    [provider]  topiramate (TOPAMAX) 50 MG tablet Take 1 tablet (50 mg total) by mouth 2 (two) times daily. 08/18/23   Joycelyn Das, MD    Physical Exam: Vitals:   08/21/23 2230 08/21/23 2300 08/21/23 2330 08/22/23 0000  BP: 114/79 (!) 81/46 (!) 93/55 (!) 84/54  Pulse:  65  64  Resp: 18 16 16    Temp:      TempSrc:      SpO2:  100%  100%  Weight:       Constitutional: Chronically ill-appearing cachectic woman resting in bed, no acute distress Eyes: EOMI, lids and conjunctivae normal ENMT: Mucous membranes are moist. Posterior pharynx clear of any exudate or lesions.Normal dentition.  Neck: normal, supple, no masses. Respiratory: Distant breath sounds. Normal respiratory effort while on 6 L O2 [place]. No accessory muscle use.  Cardiovascular: Regular rate and rhythm, no murmurs / rubs / gallops. No extremity edema. 2+ pedal pulses. Abdomen: no tenderness, no masses palpated. Musculoskeletal: no clubbing / cyanosis. No joint deformity upper and lower extremities.  Thin extremities with muscle wasting  throughout Skin: no rashes, lesions, ulcers. No induration Neurologic: Speech is slow but fluent, otherwise CN 2-12 grossly intact. Sensation intact. Strength equal bilaterally. Psychiatric: Flat affect.  Speaks in short sentences but oriented to self, place, year, and situation at time of admission compared to only oriented to self on arrival per report.  EKG: Personally reviewed. Sinus rhythm, rate 69, PVCs present.  No acute  ischemic changes.  PVCs are new when compared to previous.  Assessment/Plan Principal Problem:   Acute on chronic respiratory failure with hypoxia (HCC) Active Problems:   COPD with acute exacerbation (HCC)   Peripheral arterial disease (HCC)   Essential hypertension   Hyperlipidemia   Normocytic anemia   Seizure disorder (HCC)   Hypothyroidism   Chronic heart failure with preserved ejection fraction (HFpEF) (HCC)   History of stroke   Kendra Watts is a 78 y.o. female with medical history significant for COPD, chronic hypoxic respiratory failure on 2-3 L O2 via [place], chronic HFpEF, PAD with renal artery stenosis (s/p stenting to left renal artery 2011 and right renal artery 2016), carotid stenosis (s/p right CEA in 2013), bilateral iliac disease, lung cancer s/p SBRT, history of left occipital stroke with hemorrhagic transformation, seizures on Keppra, HTN, HLD, hypothyroidism, recent NSTEMI December 2024 (managed medically), chronic anemia who is admitted for acute on chronic hypoxic respiratory failure in setting of COPD and suspected developing pneumonia.  Assessment and Plan: Acute on chronic hypoxic respiratory failure due to COPD and suspected pneumonia: Patient usually uses 2-3 L [place] at baseline, has required 6 L [place] on admission.  CXR with question of developing pneumonia in the left lung although this could be underlying malignancy as noted on prior CT imaging. -Continue IV ceftriaxone and azithromycin for now -Start Brovana/Pulmicort BID -DuoNebs as  needed -Start prednisone 40 mg daily -Continue supplemental O2 and wean as able  Acute encephalopathy: Seems to be improving after receiving antibiotics.  Continue management as above.  History of left occipital stroke with hemorrhagic transformation: No new focal deficits at time of admission.  Continue aspirin 81 mg daily and rosuvastatin 10 mg daily.  Seizure disorder: Continue Keppra and Topamax.  Chronic HFpEF/recent NSTEMI: Appears euvolemic.  Patient denies chest pain.  Toprol-XL and Lasix on hold due to low blood pressures.  Peripheral artery disease: Continue aspirin, rosuvastatin.  Chronic normocytic anemia: Hemoglobin stable at 8.3.  Hypertension: Antihypertensives on hold due to hypotension on admission.  Will start on midodrine and monitor.  Hyperlipidemia: Continue rosuvastatin.  Hypothyroidism: Continue Synthroid 88 mcg daily.  TSH elevated at 13.634, free T4 in process.  History of lung cancer s/p SBRT Left lower lobe pulmonary nodule: Recent imaging 07/11/2023 with enlarging left lower lobe nodule suspicious for metastatic disease.  She follows with pulmonology Dr. Delton Coombes.  Plan is for outpatient follow-up with PET/CT and repeat CT chest at 98-month interval.    DVT prophylaxis: SCDs Start: 08/22/23 0007 Code Status:   Code Status: Do not attempt resuscitation (DNR) - Comfort care DNR form at bedside, previous palliative medicine documentation reviewed. Family Communication: None present on admission Disposition Plan: From SNF and likely return to SNF pending clinical progress Consults called: None Severity of Illness: The appropriate patient status for this patient is OBSERVATION. Observation status is judged to be reasonable and necessary in order to provide the required intensity of service to ensure the patient's safety. The patient's presenting symptoms, physical exam findings, and initial radiographic and laboratory data in the context of their medical  condition is felt to place them at decreased risk for further clinical deterioration. Furthermore, it is anticipated that the patient will be medically stable for discharge from the hospital within 2 midnights of admission.   Darreld Mclean MD [facility]  If 7PM-7AM, please contact night-coverage [email or web address]  08/22/2023, 12:40 AM

## 2023-08-21 NOTE — ED Provider Notes (Signed)
[place] EMERGENCY DEPARTMENT AT [facility] Provider Note  CSN: [id number] Arrival date & time: 08/21/23 1859  Chief Complaint(s) Altered Mental Status  HPI Kendra Watts is a 78 y.o. female with past medical history as below, significant for CHF, COPD, delirium who presents to the ED with complaint of AMS.  She resides at a SNF, per the SNF she has not been acting her self over the past 3 days.  From [facility].  Is normally on 2 L nasal cannula but has required 4 L nasal cannula per EMS.  On interview patient denies any acute complaints, she is unsure why she is here.  She denies any pain.  Reports she is breathing normally. No cough, no fever, no chills, no n/v.   Past Medical History Past Medical History:  Diagnosis Date   Anemia    Aortic arch anomaly    arteria lusoria    Breast cancer (HCC) 09/22/2015   Malignant   Breast cancer of upper-outer quadrant of left female breast (HCC) 09/08/2015   Cataract, immature    CHF (congestive heart failure) (HCC)    Acute CHF-06/2018   COPD (chronic obstructive pulmonary disease) (HCC)    Encephalopathy acute 11/12/2021   Femur fracture, left (HCC) 04/11/2020   left femur fracture( greater trochanter   Heart murmur    states no known problems   History of cervical spine x-ray 11/13/2021   History of hyperthyroidism    History of radiation therapy    bilateral lungs - 10/27/18-11/02/18, Dr. Antony Blackbird   History of radiation therapy 11/28/2015   left breast 10/30/2015-11/28/2015   Dr Antony Blackbird   Hyperlipidemia    Hypertension    states under control with meds., has been on med. x "long time"   Hypokalemia    from last physical.    Hypothyroidism    Nonfunctioning kidney    left   Personal history of radiation therapy    2017   Pulmonary nodules    Bilateral   Radiation 10/30/15-11/28/15   left breast 42.72 Gy, boosted to 10 Gy   Renal artery stenosis (HCC)    Tobacco abuse    Wears partial dentures     upper and lower   Patient Active Problem List   Diagnosis Date Noted   AMS (altered mental status) 08/08/2023   NSTEMI (non-ST elevated myocardial infarction) (HCC) 07/11/2023   Acute cystitis 11/22/2021   Delirium due to multiple etiologies, acute, hyperactive 11/21/2021   Acute metabolic encephalopathy 11/21/2021   Hypothyroidism, unspecified 11/21/2021   Purulent bronchitis (HCC) 11/16/2021   Urinary retention 11/16/2021   DNR (do not resuscitate) 11/16/2021   Intracerebral hemorrhage 11/13/2021   Protein-calorie malnutrition, severe 11/13/2021   Status epilepticus (HCC)    Acute on chronic diastolic heart failure (HCC) 05/23/2020   COPD with acute exacerbation (HCC) 05/23/2020   Multiple closed fractures of ribs of left side    Fall    Palliative care by specialist    Goals of care, counseling/discussion    Advanced directives, counseling/discussion    Generalized weakness    Fracture of one rib, left side, initial encounter for closed fracture 04/08/2020   Fracture of greater trochanter of left femur (HCC) 04/08/2020   Closed fracture of greater trochanter of left femur (HCC) 04/08/2020   Epistaxis 03/13/2020   Acute blood loss anemia 03/13/2020   Current smoker    Hypertension, uncontrolled    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)    Hemoptysis 09/14/2018  Squamous cell lung cancer, left (HCC) 09/10/2018   Adenocarcinoma, lung, 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   Multiple lung nodules 07/20/2018   Chronic obstructive pulmonary disease (HCC) 07/20/2018   Acute CHF (congestive heart failure) (HCC) 07/06/2018   Chronic respiratory failure with hypoxia (HCC) 07/06/2018   Hyponatremia 07/06/2018   Anemia 07/06/2018   Breast cancer of upper-outer quadrant of left female breast (HCC) 09/08/2015   Renal artery stenosis (HCC) 08/23/2014   Right upper extremity numbness 07/01/2013   Atherosclerotic renal artery stenosis, bilateral (HCC) 05/26/2013   Peripheral arterial disease  (HCC) 05/26/2013   Essential hypertension 05/26/2013   Hyperlipidemia 05/26/2013   Tobacco abuse 05/26/2013   Carotid artery stenosis 03/09/2012   Home Medication(s) Prior to Admission medications   Medication Sig Start Date End Date Taking? Authorizing Provider  aspirin 81 MG tablet Take 1 tablet (81 mg total) by mouth daily. 03/21/20   Hongalgi, Maximino Greenland, MD  Budeson-Glycopyrrol-Formoterol (BREZTRI AEROSPHERE) 160-9-4.8 MCG/ACT AERO Inhale 2 puffs into the lungs in the morning and at bedtime. 06/12/21   Leslye Peer, MD  docusate sodium (COLACE) 100 MG capsule Take 1 capsule (100 mg total) by mouth 2 (two) times daily as needed for mild constipation. 08/18/23   Pokhrel, Rebekah Chesterfield, MD  doxazosin (CARDURA) 8 MG tablet Take 8 mg by mouth daily.    [provider]  feeding supplement (ENSURE ENLIVE / ENSURE PLUS) LIQD Take 237 mLs by mouth 2 (two) times daily between meals. 08/18/23   Pokhrel, Rebekah Chesterfield, MD  furosemide (LASIX) 20 MG tablet Take 20 mg by mouth daily.    [provider]  ipratropium-albuterol (DUONEB) 0.5-2.5 (3) MG/3ML SOLN Take 3 mLs by nebulization every 4 (four) hours as needed (shortness of breath wheezing). 08/18/23   Pokhrel, Rebekah Chesterfield, MD  levETIRAcetam (KEPPRA) 500 MG tablet Take 1 tablet (500 mg total) by mouth 2 (two) times daily. 05/20/23   Lomax, Amy, NP  levothyroxine (SYNTHROID) 88 MCG tablet Take 88 mcg by mouth daily before breakfast.    [provider]  metoprolol succinate (TOPROL-XL) 50 MG 24 hr tablet Take 1 tablet (50 mg total) by mouth daily. Take with or immediately following a meal. 07/18/23   Pokhrel, Laxman, MD  Multiple Vitamin (MULTIVITAMIN WITH MINERALS) TABS tablet Take 1 tablet by mouth daily. 08/19/23   Pokhrel, Rebekah Chesterfield, MD  OXYGEN Inhale 2-4 L into the lungs as needed. As needed to maintain SATS > 90%    [provider]  pantoprazole (PROTONIX) 40 MG tablet Take 1 tablet (40 mg total) by mouth daily. 07/14/18   Darlin Drop,  DO  rosuvastatin (CRESTOR) 10 MG tablet Take 10 mg by mouth daily.    [provider]  topiramate (TOPAMAX) 50 MG tablet Take 1 tablet (50 mg total) by mouth 2 (two) times daily. 08/18/23   Pokhrel, Rebekah Chesterfield, MD  Past Surgical History Past Surgical History:  Procedure Laterality Date   ABDOMINAL ANGIOGRAM  02/18/2012   Procedure: ABDOMINAL ANGIOGRAM;  Surgeon: Runell Gess, MD;  Location: [facility] CATH LAB;  Service: Cardiovascular;;   ABDOMINAL AORTAGRAM  07/04/2014   ABDOMINAL HYSTERECTOMY  ~ 1977   partial   APPENDECTOMY     ARCH AORTOGRAM     BREAST LUMPECTOMY Left 09/22/2015   Malignant   CAROTID ANGIOGRAM N/A 02/18/2012   Procedure: CAROTID ANGIOGRAM;  Surgeon: Runell Gess, MD;  Location: [facility] CATH LAB;  Service: Cardiovascular;  Laterality: N/A;   ENDARTERECTOMY  04/02/2012   Procedure: ENDARTERECTOMY CAROTID;  Surgeon: Nada Libman, MD;  Location: [facility] OR;  Service: Vascular;  Laterality: Right;   FUDUCIAL PLACEMENT Bilateral 09/02/2018   Procedure: Placement Of Fiducial to right upper lobe & left upper lobe lung;  Surgeon: Leslye Peer, MD;  Location: [facility] OR;  Service: Thoracic;  Laterality: Bilateral;   IR THORACENTESIS ASP PLEURAL SPACE W/IMG GUIDE  07/08/2018   RADIOACTIVE SEED GUIDED PARTIAL MASTECTOMY WITH AXILLARY SENTINEL LYMPH NODE BIOPSY Left 09/22/2015   Procedure: INJECT BLUE DYE LEFT BREAST,RADIOACTIVE SEED GUIDED PARTIAL MASTECTOMY WITH AXILLARY SENTINEL LYMPH NODE BIOPSY;  Surgeon: Claud Kelp, MD;  Location: [facility];  Service: General;  Laterality: Left;   RENAL ANGIOGRAM Left 06/08/2010   renal artery stent -  5x12 Genesis on Aviator balloon stent (Dr. Erlene Quan)   RENAL ANGIOGRAM Right 07/04/2014   Procedure: RENAL ANGIOGRAM;  Surgeon: Runell Gess, MD;  Location[facility] C[facility] LAB;  Service: Cardiovascular;   Laterality: Right;   RENAL ANGIOGRAM Right 08/22/2014   Procedure: RENAL ANGIOGRAM;  Surgeon: Runell Gess, MD;  Location: [facility] CATH LAB;  Service: Cardiovascular;  Laterality: Right;   TONSILLECTOMY     as a child   VIDEO BRONCHOSCOPY WITH ENDOBRONCHIAL NAVIGATION N/A 09/02/2018   Procedure: VIDEO BRONCHOSCOPY WITH ENDOBRONCHIAL NAVIGATION;  Surgeon: Leslye Peer, MD;  Location: [facility] OR;  Service: Thoracic;  Laterality: N/A;   Family History Family History  Problem Relation Age of Onset   Heart disease Mother        MI @ 20, died at 49   Cancer Father    Lung cancer Father    Lung cancer Sister    Breast cancer Paternal Aunt    Heart disease Maternal Grandmother    Stroke Neg Hx     Social History Social History   Tobacco Use   Smoking status: Every Day    Current packs/day: 0.50    Average packs/day: 0.5 packs/day for 40.0 years (20.0 ttl pk-yrs)    Types: Cigarettes   Smokeless tobacco: Never   Tobacco comments:    15 cigarettes smoked daily ARJ 04/19/21  Vaping Use   Vaping status: Never Used  Substance Use Topics   Alcohol use: Yes    Alcohol/week: 4.0 standard drinks of alcohol    Types: 4 Cans of beer per week    Comment: occasionally   Drug use: No   Allergies Patient has no known allergies.  Review of Systems Review of Systems  Unable to perform ROS: Mental status change  Constitutional:  Negative for chills and fever.  Respiratory:  Negative for chest tightness and shortness of breath.   Cardiovascular:  Negative for chest pain.  Gastrointestinal:  Negative for abdominal pain.  Genitourinary:  Negative for dysuria.  Musculoskeletal:  Negative for arthralgias.  Neurological:  Negative for headaches.    Physical Exam Vital Signs  I have reviewed the triage vital signs BP 125/64   Pulse 64   Temp 98.3 F (36.8 C) (Oral)   Resp 16   Wt 37 kg   SpO2 94%   BMI 15.93 kg/m  Physical Exam Vitals and nursing note reviewed.  Constitutional:       General: She is not in acute distress.    Appearance: Normal appearance.  HENT:     Head: Normocephalic and atraumatic.     Right Ear: External ear normal.     Left Ear: External ear normal.     Nose: Nose normal.     Mouth/Throat:     Mouth: Mucous membranes are dry.  Eyes:     General: No scleral icterus.       Right eye: No discharge.        Left eye: No discharge.  Cardiovascular:     Rate and Rhythm: Normal rate and regular rhythm.     Pulses: Normal pulses.     Heart sounds: Normal heart sounds.  Pulmonary:     Effort: Pulmonary effort is normal. No respiratory distress.     Breath sounds: Normal breath sounds. No stridor.  Abdominal:     General: Abdomen is flat. There is no distension.     Palpations: Abdomen is soft.     Tenderness: There is no abdominal tenderness.  Musculoskeletal:     Cervical back: No rigidity.     Right lower leg: No edema.     Left lower leg: No edema.  Skin:    General: Skin is warm and dry.     Capillary Refill: Capillary refill takes less than 2 seconds.  Neurological:     Mental Status: She is alert.     Cranial Nerves: Cranial nerves 2-12 are intact.     Sensory: Sensation is intact.     Motor: Motor function is intact.     Coordination: Coordination is intact.     Comments: Aox2 to person/place, disoriented to time  Gait testing deferred secondary to patient safety.  Strength 5/5 to BLUE/BLLE, equal and symmetric     Psychiatric:        Mood and Affect: Mood normal.        Behavior: Behavior normal. Behavior is cooperative.     ED Results and Treatments Labs (all labs ordered are listed, but only abnormal results are displayed) Labs Reviewed  BASIC METABOLIC PANEL - Abnormal; Notable for the following components:      Result Value   CO2 35 (*)    Glucose, Bld 112 (*)    Calcium 8.5 (*)    All other components within normal limits  BRAIN NATRIURETIC PEPTIDE - Abnormal; Notable for the following components:   B  Natriuretic Peptide 281.3 (*)    All other components within normal limits  CBC WITH DIFFERENTIAL/PLATELET - Abnormal; Notable for the following components:   WBC 10.6 (*)    RBC 2.94 (*)    Hemoglobin 8.3 (*)    HCT 28.2 (*)    MCHC 29.4 (*)    RDW 18.0 (*)    All other components within normal limits  BLOOD GAS, VENOUS - Abnormal; Notable for the following components:   pCO2, Ven 70 (*)    pO2, Ven 50 (*)    Bicarbonate 41.4 (*)    Acid-Base Excess 14.2 (*)    All other components within normal limits  URINALYSIS, ROUTINE W REFLEX MICROSCOPIC - Abnormal; Notable for the following components:  Hgb urine dipstick SMALL (*)    Leukocytes,Ua SMALL (*)    All other components within normal limits  TSH - Abnormal; Notable for the following components:   TSH 13.634 (*)    All other components within normal limits  RESP PANEL BY RT-PCR (RSV, FLU A&B, COVID)  RVPGX2  AMMONIA  T4, FREE  LEVETIRACETAM LEVEL  TROPONIN I (HIGH SENSITIVITY)  TROPONIN I (HIGH SENSITIVITY)                                                                                                                          Radiology DG Chest 2 View Result Date: 08/21/2023 CLINICAL DATA:  Altered mental status.  History of CHF and COPD. EXAM: CHEST - 2 VIEW COMPARISON:  08/09/2023, 07/11/2023. FINDINGS: The heart size and mediastinal contours are stable. Stable opacities with fiducial markers are noted in the upper lobes bilaterally. Slightly increased scattered opacities are noted in the mid left lung and perihilar region on the right. There is blunting of the right costophrenic angle, possible small right pleural effusion. No acute osseous abnormality is seen. Surgical clips are noted in the left breast. IMPRESSION: 1. Slightly increased opacities in the mid left lung and perihilar region on the right, possible edema or infiltrate. The possibility of underlying malignancy can not be excluded. 2. Suspected small right pleural  effusion. 3. Remaining findings are unchanged. Electronically Signed   By: Thornell Sartorius M.D.   On: 08/21/2023 20:50   CT Head Wo Contrast Result Date: 08/21/2023 CLINICAL DATA:  Delirium. EXAM: CT HEAD WITHOUT CONTRAST TECHNIQUE: Contiguous axial images were obtained from the base of the skull through the vertex without intravenous contrast. RADIATION DOSE REDUCTION: This exam was performed according to the departmental dose-optimization program which includes automated exposure control, adjustment of the mA and/or kV according to patient size and/or use of iterative reconstruction technique. COMPARISON:  08/08/2023. FINDINGS: Brain: No acute intracranial hemorrhage, midline shift or mass effect. No extra-axial fluid collection is seen. Diffuse atrophy is noted. Periventricular white matter hypodensities are noted bilaterally. No hydrocephalus. Vascular: No hyperdense vessel or unexpected calcification. Skull: Normal. Negative for fracture or focal lesion. Sinuses/Orbits: No acute finding. Other: None. IMPRESSION: 1. No acute intracranial process. 2. Atrophy with chronic microvascular ischemic changes. Electronically Signed   By: Thornell Sartorius M.D.   On: 08/21/2023 20:44    Pertinent labs & imaging results that were available during my care of the patient were reviewed by me and considered in my medical decision making (see MDM for details).  Medications Ordered in ED Medications  cefTRIAXone (ROCEPHIN) 1 g in sodium chloride 0.9 % 100 mL IVPB (1 g Intravenous New Bag/Given 08/21/23 2203)  azithromycin (ZITHROMAX) 500 mg in sodium chloride 0.9 % 250 mL IVPB (has no administration in time range)  sodium chloride 0.9 % bolus 1,000 mL (1,000 mLs Intravenous New Bag/Given 08/21/23 2050)  Procedures .Critical Care  Performed by: Sloan Leiter, DO Authorized by: Sloan Leiter, DO   Critical care provider statement:    Critical care time (minutes):  45   Critical care time was exclusive of:  Separately billable procedures and treating other patients   Critical care was necessary to treat or prevent imminent or life-threatening deterioration of the following conditions:  Respiratory failure   Critical care was time spent personally by me on the following activities:  Development of treatment plan with patient or surrogate, discussions with consultants, evaluation of patient's response to treatment, examination of patient, ordering and review of laboratory studies, ordering and review of radiographic studies, ordering and performing treatments and interventions, pulse oximetry, re-evaluation of patient's condition, review of old charts and obtaining history from patient or surrogate   Care discussed with: admitting provider     (including critical care time)  Medical Decision Making / ED Course    Medical Decision Making:    HOLIDAY MCMENAMIN is a 78 y.o. female with past medical history as below, significant for CHF, COPD, delirium who presents to the ED with complaint of AMS.. The complaint involves an extensive differential diagnosis and also carries with it a high risk of complications and morbidity.  Serious etiology was considered. Ddx includes but is not limited to: Differential diagnoses for altered mental status includes but is not exclusive to alcohol, illicit or prescription medications, intracranial pathology such as stroke, intracerebral hemorrhage, fever or infectious causes including sepsis, hypoxemia, uremia, trauma, endocrine related disorders such as diabetes, hypoglycemia, thyroid-related diseases, etc.   Complete initial physical exam performed, notably the patient was in hypoxia noted on 4 L, increased to 6 L.    Reviewed and confirmed nursing documentation for past medical history, family history, social history.  Vital signs reviewed.       Clinical Course as of 08/23/23 2251  Thu Aug 21, 2023  1928 Pt took oxygen off, she was 75% on RA, she wears 2LPMNC at home normally, she is requiring 6L tonight [SG]  1933 Pulse ox 95% on 6LPM [SG]  2253 Hemoglobin(!): 8.3 Chronic anemia [SG]  2253 pCO2, Ven(!): 70 [SG]  2253 pH, Ven: 7.38 compensated, history of COPD [SG]  2317 Changed bp cuff, she has very small arms and was also asleep. Rpt bp 102/70  [SG]    Clinical Course User Index [SG] Sloan Leiter, DO    Brief summary: 78 year old female history as above including COPD, CHF, resides at SNF here with AMS x 3 days.  Creased oxygen requirement w/ EMS.  Labs reviewed, these are stable.  VBG does have compensated hypercapnia w/ pH 7.38. CXR w/ ?pna. Increased o2 requirement.   Admitted 1/17 secondary to respiratory failure with AMS.  During that admission she was intubated also noted to have occipital hemorrhage.  Patient with AMS, likely metabolic.  She is following commands appropriately.  Answering questions appropriately but she is not sure where she is.  Concern for pneumonia.  Start antibiotics.  Plan to admit              Additional history obtained: -Additional history obtained from ems -External records from outside source obtained and reviewed including: Chart review including previous notes, labs, imaging, consultation notes including  Prior admission, primary care documentation, home medications  Admitted 1/17 secondary to respiratory failure with AMS.  During that admission she was intubated also noted to have occipital hemorrhage.   Lab Tests: -I ordered, reviewed, and  interpreted labs.   The pertinent results include:   Labs Reviewed  BASIC METABOLIC PANEL - Abnormal; Notable for the following components:      Result Value   CO2 35 (*)    Glucose, Bld 112 (*)    Calcium 8.5 (*)    All other components within normal limits  BRAIN NATRIURETIC PEPTIDE - Abnormal; Notable for the  following components:   B Natriuretic Peptide 281.3 (*)    All other components within normal limits  CBC WITH DIFFERENTIAL/PLATELET - Abnormal; Notable for the following components:   WBC 10.6 (*)    RBC 2.94 (*)    Hemoglobin 8.3 (*)    HCT 28.2 (*)    MCHC 29.4 (*)    RDW 18.0 (*)    All other components within normal limits  BLOOD GAS, VENOUS - Abnormal; Notable for the following components:   pCO2, Ven 70 (*)    pO2, Ven 50 (*)    Bicarbonate 41.4 (*)    Acid-Base Excess 14.2 (*)    All other components within normal limits  URINALYSIS, ROUTINE W REFLEX MICROSCOPIC - Abnormal; Notable for the following components:   Hgb urine dipstick SMALL (*)    Leukocytes,Ua SMALL (*)    All other components within normal limits  TSH - Abnormal; Notable for the following components:   TSH 13.634 (*)    All other components within normal limits  RESP PANEL BY RT-PCR (RSV, FLU A&B, COVID)  RVPGX2  AMMONIA  T4, FREE  LEVETIRACETAM LEVEL  TROPONIN I (HIGH SENSITIVITY)  TROPONIN I (HIGH SENSITIVITY)    Notable for as above  EKG   EKG Interpretation Date/Time:  Thursday August 21 2023 19:08:07 EST Ventricular Rate:  69 PR Interval:  139 QRS Duration:  62 QT Interval:  447 QTC Calculation: 479 R Axis:   58  Text Interpretation: Sinus rhythm Multiform ventricular premature complexes Nonspecific T abnrm, anterolateral leads Confirmed by Tanda Rockers ([id number]) on 08/21/2023 7:18:22 PM         Imaging Studies ordered: I ordered imaging studies including cxr cth I independently visualized the following imaging with scope of interpretation limited to determining acute life threatening conditions related to emergency care; findings noted above I independently visualized and interpreted imaging. I agree with the radiologist interpretation   Medicines ordered and prescription drug management: Meds ordered this encounter  Medications   sodium chloride 0.9 % bolus 1,000 mL   cefTRIAXone  (ROCEPHIN) 1 g in sodium chloride 0.9 % 100 mL IVPB    Antibiotic Indication::   CAP   azithromycin (ZITHROMAX) 500 mg in sodium chloride 0.9 % 250 mL IVPB    Antibiotic Indication::   CAP    -I have reviewed the patients home medicines and have made adjustments as needed   Consultations Obtained: na   Cardiac Monitoring: Continuous pulse oximetry interpreted by myself, 96% on 5L.    Social Determinants of Health:  Diagnosis or treatment significantly limited by social determinants of health: tobacco use   Reevaluation: After the interventions noted above, I reevaluated the patient and found that they have improved  Co morbidities that complicate the patient evaluation  Past Medical History:  Diagnosis Date   Anemia    Aortic arch anomaly    arteria lusoria    Breast cancer (HCC) 09/22/2015   Malignant   Breast cancer of upper-outer quadrant of left female breast (HCC) 09/08/2015   Cataract, immature    CHF (congestive heart failure) (HCC)  Acute CHF-06/2018   COPD (chronic obstructive pulmonary disease) (HCC)    Encephalopathy acute 11/12/2021   Femur fracture, left (HCC) 04/11/2020   left femur fracture( greater trochanter   Heart murmur    states no known problems   History of cervical spine x-ray 11/13/2021   History of hyperthyroidism    History of radiation therapy    bilateral lungs - 10/27/18-11/02/18, Dr. Antony Blackbird   History of radiation therapy 11/28/2015   left breast 10/30/2015-11/28/2015   Dr Antony Blackbird   Hyperlipidemia    Hypertension    states under control with meds., has been on med. x "long time"   Hypokalemia    from last physical.    Hypothyroidism    Nonfunctioning kidney    left   Personal history of radiation therapy    2017   Pulmonary nodules    Bilateral   Radiation 10/30/15-11/28/15   left breast 42.72 Gy, boosted to 10 Gy   Renal artery stenosis (HCC)    Tobacco abuse    Wears partial dentures    upper and lower       Dispostion: Disposition decision including need for hospitalization was considered, and patient admitted to the hospital.    Final Clinical Impression(s) / ED Diagnoses Final diagnoses:  Altered mental status, unspecified altered mental status type  Hypoxia  Community acquired pneumonia, unspecified laterality        Sloan Leiter, DO 08/23/23 2252

## 2023-08-21 NOTE — ED Triage Notes (Signed)
Pt sent from [facility] for altered mental status x3 days. Pt is able to tell you her name, but is difficult to get other answers.

## 2023-08-21 NOTE — ED Notes (Signed)
Patient transported to CT

## 2023-08-21 NOTE — ED Notes (Signed)
Pt is asleep will get temp once she wakes up

## 2023-08-22 ENCOUNTER — Other Ambulatory Visit (HOSPITAL_COMMUNITY): Payer: Medicare Other

## 2023-08-22 DIAGNOSIS — I5032 Chronic diastolic (congestive) heart failure: Secondary | ICD-10-CM | POA: Diagnosis present

## 2023-08-22 DIAGNOSIS — Z8673 Personal history of transient ischemic attack (TIA), and cerebral infarction without residual deficits: Secondary | ICD-10-CM | POA: Diagnosis not present

## 2023-08-22 DIAGNOSIS — J441 Chronic obstructive pulmonary disease with (acute) exacerbation: Secondary | ICD-10-CM

## 2023-08-22 DIAGNOSIS — I10 Essential (primary) hypertension: Secondary | ICD-10-CM | POA: Diagnosis not present

## 2023-08-22 DIAGNOSIS — Z7401 Bed confinement status: Secondary | ICD-10-CM | POA: Diagnosis not present

## 2023-08-22 DIAGNOSIS — I619 Nontraumatic intracerebral hemorrhage, unspecified: Secondary | ICD-10-CM | POA: Diagnosis not present

## 2023-08-22 DIAGNOSIS — J189 Pneumonia, unspecified organism: Secondary | ICD-10-CM | POA: Diagnosis not present

## 2023-08-22 DIAGNOSIS — C3491 Malignant neoplasm of unspecified part of right bronchus or lung: Secondary | ICD-10-CM | POA: Diagnosis not present

## 2023-08-22 DIAGNOSIS — C50412 Malignant neoplasm of upper-outer quadrant of left female breast: Secondary | ICD-10-CM | POA: Diagnosis not present

## 2023-08-22 DIAGNOSIS — G40909 Epilepsy, unspecified, not intractable, without status epilepticus: Secondary | ICD-10-CM

## 2023-08-22 DIAGNOSIS — I739 Peripheral vascular disease, unspecified: Secondary | ICD-10-CM | POA: Diagnosis not present

## 2023-08-22 DIAGNOSIS — J9601 Acute respiratory failure with hypoxia: Secondary | ICD-10-CM | POA: Diagnosis not present

## 2023-08-22 DIAGNOSIS — I214 Non-ST elevation (NSTEMI) myocardial infarction: Secondary | ICD-10-CM | POA: Diagnosis not present

## 2023-08-22 DIAGNOSIS — J9621 Acute and chronic respiratory failure with hypoxia: Secondary | ICD-10-CM | POA: Diagnosis not present

## 2023-08-22 DIAGNOSIS — C3492 Malignant neoplasm of unspecified part of left bronchus or lung: Secondary | ICD-10-CM | POA: Diagnosis not present

## 2023-08-22 DIAGNOSIS — D649 Anemia, unspecified: Secondary | ICD-10-CM | POA: Diagnosis not present

## 2023-08-22 DIAGNOSIS — I69351 Hemiplegia and hemiparesis following cerebral infarction affecting right dominant side: Secondary | ICD-10-CM | POA: Diagnosis not present

## 2023-08-22 DIAGNOSIS — J449 Chronic obstructive pulmonary disease, unspecified: Secondary | ICD-10-CM | POA: Diagnosis not present

## 2023-08-22 DIAGNOSIS — E43 Unspecified severe protein-calorie malnutrition: Secondary | ICD-10-CM | POA: Diagnosis not present

## 2023-08-22 DIAGNOSIS — R4182 Altered mental status, unspecified: Secondary | ICD-10-CM

## 2023-08-22 DIAGNOSIS — I701 Atherosclerosis of renal artery: Secondary | ICD-10-CM | POA: Diagnosis not present

## 2023-08-22 DIAGNOSIS — I6529 Occlusion and stenosis of unspecified carotid artery: Secondary | ICD-10-CM | POA: Diagnosis not present

## 2023-08-22 DIAGNOSIS — R509 Fever, unspecified: Secondary | ICD-10-CM | POA: Diagnosis not present

## 2023-08-22 DIAGNOSIS — I5033 Acute on chronic diastolic (congestive) heart failure: Secondary | ICD-10-CM | POA: Diagnosis not present

## 2023-08-22 LAB — BASIC METABOLIC PANEL
Anion gap: 5 (ref 5–15)
BUN: 18 mg/dL (ref 8–23)
CO2: 31 mmol/L (ref 22–32)
Calcium: 7.5 mg/dL — ABNORMAL LOW (ref 8.9–10.3)
Chloride: 107 mmol/L (ref 98–111)
Creatinine, Ser: 0.66 mg/dL (ref 0.44–1.00)
GFR, Estimated: >60 mL/min (ref >60)
Glucose, Bld: 75 mg/dL (ref 70–99)
Potassium: 3.2 mmol/L — ABNORMAL LOW (ref 3.5–5.1)
Sodium: 143 mmol/L (ref 135–145)

## 2023-08-22 LAB — CBC
HCT: 26 % — ABNORMAL LOW (ref 36.0–46.0)
Hemoglobin: 7.3 g/dL — ABNORMAL LOW (ref 12.0–15.0)
MCH: 28 pg (ref 26.0–34.0)
MCHC: 28.1 g/dL — ABNORMAL LOW (ref 30.0–36.0)
MCV: 99.6 fL (ref 80.0–100.0)
Platelets: 285 10*3/uL (ref 150–400)
RBC: 2.61 MIL/uL — ABNORMAL LOW (ref 3.87–5.11)
RDW: 18 % — ABNORMAL HIGH (ref 11.5–15.5)
WBC: 8.3 10*3/uL (ref 4.0–10.5)
nRBC: 0 % (ref 0.0–0.2)

## 2023-08-22 LAB — T4, FREE: Free T4: 0.86 ng/dL (ref 0.61–1.12)

## 2023-08-22 MED ORDER — IPRATROPIUM-ALBUTEROL 0.5-2.5 (3) MG/3ML IN SOLN: 3.0000 mL | Freq: Four times a day (QID) | RESPIRATORY_TRACT | Status: DC | PRN

## 2023-08-22 MED ORDER — ACETAMINOPHEN 325 MG PO TABS: 650.0000 mg | ORAL_TABLET | Freq: Four times a day (QID) | ORAL | Status: DC | PRN

## 2023-08-22 MED ORDER — LEVOTHYROXINE SODIUM 88 MCG PO TABS
88.0000 ug | ORAL_TABLET | Freq: Every day | ORAL | Status: DC
  Filled 2023-08-22: qty 1

## 2023-08-22 MED ORDER — PANTOPRAZOLE SODIUM 40 MG PO TBEC
40.0000 mg | DELAYED_RELEASE_TABLET | Freq: Every day | ORAL | Status: DC
  Administered 2023-08-22: 40 mg via ORAL
  Filled 2023-08-22: qty 1

## 2023-08-22 MED ORDER — MIDODRINE HCL 5 MG PO TABS
5.0000 mg | ORAL_TABLET | Freq: Three times a day (TID) | ORAL | Status: DC
  Administered 2023-08-22: 5 mg via ORAL
  Filled 2023-08-22: qty 1

## 2023-08-22 MED ORDER — ONDANSETRON HCL 4 MG PO TABS: 4.0000 mg | ORAL_TABLET | Freq: Four times a day (QID) | ORAL | Status: DC | PRN

## 2023-08-22 MED ORDER — ROSUVASTATIN CALCIUM 20 MG PO TABS
10.0000 mg | ORAL_TABLET | Freq: Every day | ORAL | Status: DC
  Administered 2023-08-22: 10 mg via ORAL
  Filled 2023-08-22: qty 1

## 2023-08-22 MED ORDER — ARFORMOTEROL TARTRATE 15 MCG/2ML IN NEBU
15.0000 ug | INHALATION_SOLUTION | Freq: Two times a day (BID) | RESPIRATORY_TRACT | Status: DC
Start: 2023-08-22 — End: 2023-08-22
  Administered 2023-08-22: 15 ug via RESPIRATORY_TRACT
  Filled 2023-08-22: qty 2

## 2023-08-22 MED ORDER — ACETAMINOPHEN 650 MG RE SUPP: 650.0000 mg | Freq: Four times a day (QID) | RECTAL | Status: DC | PRN

## 2023-08-22 MED ORDER — SODIUM CHLORIDE 0.9 % IV SOLN: 500.0000 mg | INTRAVENOUS | Status: DC

## 2023-08-22 MED ORDER — SODIUM CHLORIDE 0.9% FLUSH
3.0000 mL | Freq: Two times a day (BID) | INTRAVENOUS | Status: DC
  Administered 2023-08-22 (×2): 3 mL via INTRAVENOUS

## 2023-08-22 MED ORDER — PREDNISONE 20 MG PO TABS: 40.0000 mg | ORAL_TABLET | Freq: Every day | ORAL | Status: AC

## 2023-08-22 MED ORDER — ONDANSETRON HCL 4 MG/2ML IJ SOLN: 4.0000 mg | Freq: Four times a day (QID) | INTRAMUSCULAR | Status: DC | PRN

## 2023-08-22 MED ORDER — BUDESONIDE 0.25 MG/2ML IN SUSP
0.2500 mg | Freq: Two times a day (BID) | RESPIRATORY_TRACT | Status: DC
  Administered 2023-08-22: 0.25 mg via RESPIRATORY_TRACT
  Filled 2023-08-22: qty 2

## 2023-08-22 MED ORDER — SENNOSIDES-DOCUSATE SODIUM 8.6-50 MG PO TABS: 1.0000 | ORAL_TABLET | Freq: Every evening | ORAL | Status: DC | PRN

## 2023-08-22 MED ORDER — ASPIRIN 81 MG PO TBEC
81.0000 mg | DELAYED_RELEASE_TABLET | Freq: Every day | ORAL | Status: DC
  Administered 2023-08-22: 81 mg via ORAL
  Filled 2023-08-22: qty 1

## 2023-08-22 MED ORDER — SODIUM CHLORIDE 0.9 % IV SOLN: 2.0000 g | INTRAVENOUS | Status: DC

## 2023-08-22 MED ORDER — PREDNISONE 20 MG PO TABS
40.0000 mg | ORAL_TABLET | Freq: Every day | ORAL | Status: DC
  Filled 2023-08-22: qty 2

## 2023-08-22 MED ORDER — AMOXICILLIN-POT CLAVULANATE 875-125 MG PO TABS: 1.0000 | ORAL_TABLET | Freq: Two times a day (BID) | ORAL | Status: AC

## 2023-08-22 MED ORDER — LEVETIRACETAM 500 MG PO TABS
500.0000 mg | ORAL_TABLET | Freq: Two times a day (BID) | ORAL | Status: DC
  Administered 2023-08-22: 500 mg via ORAL
  Filled 2023-08-22: qty 1

## 2023-08-22 MED ORDER — TOPIRAMATE 25 MG PO TABS
50.0000 mg | ORAL_TABLET | Freq: Two times a day (BID) | ORAL | Status: DC
  Administered 2023-08-22: 50 mg via ORAL
  Filled 2023-08-22: qty 2

## 2023-08-22 MED ORDER — AZITHROMYCIN 250 MG PO TABS: ORAL_TABLET | ORAL | Status: AC

## 2023-08-22 MED ORDER — POTASSIUM CHLORIDE 20 MEQ PO PACK
40.0000 meq | PACK | Freq: Once | ORAL | Status: AC
Start: 2023-08-22 — End: 2023-08-22
  Administered 2023-08-22: 40 meq via ORAL
  Filled 2023-08-22: qty 2

## 2023-08-22 NOTE — H&P (Incomplete)
History and Physical    Kendra Watts DOB: 1945-12-04 DOA: 08/21/2023  PCP: Georgianne Fick, MD  Patient coming from: SNF  I have personally briefly reviewed patient's old medical records in [facility]  Chief Complaint: Altered mental status, shortness of breath  HPI: Kendra Watts is a 78 y.o. female with medical history significant for COPD, chronic hypoxic respiratory failure on 2-3 L O2 via [place], chronic HFpEF, PAD with renal artery stenosis (s/p stenting to left renal artery 2011 and right renal artery 2016), carotid stenosis (s/p right CEA in 2013), bilateral iliac disease, lung cancer s/p SBRT, history of left occipital stroke with hemorrhagic transformation, seizures on Keppra, HTN, HLD, hypothyroidism, recent NSTEMI December 2024 (managed medically), chronic anemia who was brought to the ED from [facility] for evaluation of altered mental status.  Patient brought to the ED from SNF for evaluation of altered mental status reportedly for the last 3 days.  Reportedly she was only oriented to self prior to arrival however at time of admitting exam she is oriented to self, situation, place, and year although she is only speaking in brief short sentences and not providing much further history.  She does report some trouble breathing but otherwise has no other complaints.  Patient with two recent admissions: 07/11/2023-07/18/2023 for NSTEMI managed medically on heparin drip.  Seen by cardiology and not considered candidate for aggressive interventions.  08/08/2023-08/18/23 after found obtunded at her SNF.  Required intubation and admitted to ICU.  She was treated for for possible aspiration and extubated.  She underwent long-term EEG and MRI brain which were negative for acute findings.  She was continued on Keppra and Topamax.  She was discharged back to SNF on 3 L O2 via [place].  ED Course  Labs/Imaging on admission: I have personally reviewed following labs and  imaging studies.  Initial vitals showed BP 111/84, pulse 68, RR 16, temp 98.3 F, SpO2 98% on 5 L O2 via [place].  Labs showed WBC 10.6, hemoglobin 8.3, platelets 266,000, sodium 142, potassium 4.2, bicarb 35, BUN 22, creatinine 0.87, serum glucose 112, BNP 281.3, troponin 17, ammonia 33.  VBG pH 7.38, pCO2 70, pO2 50.  TSH 13.634, free T4 in process.  Keppra level in process.  UA negative for UTI.  SARS-CoV-2, influenza, RSV PCR negative.  CT head without contrast negative for acute intracranial process.  Atrophy with chronic microvascular schema changes noted.  2 view chest x-ray showed slightly increased opacities in the mid left lung and perihilar region on the right, possible edema or infiltrate.  Underlying malignancy cannot be excluded.  Suspected small right pleural effusion.  Patient was given 1 L normal saline, IV ceftriaxone and azithromycin.  The hospitalist service was consulted to admit for further evaluation and management.  Review of Systems: All systems reviewed and are negative except as documented in history of present illness above.   Past Medical History:  Diagnosis Date  . Anemia   . 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 Breast cancer (HCC) 09/22/2015   Malignant  . Breast cancer of upper-outer quadrant of left female breast (HCC) 09/08/2015  . Cataract, immature   . CHF (congestive heart failure) (HCC)    Acute CHF-06/2018  . COPD (chronic obstructive pulmonary disease) (HCC)   . Encephalopathy acute 11/12/2021  . Femur fracture, left (HCC) 04/11/2020   left femur fracture( greater trochanter  . Heart murmur    states no known problems  .  History of cervical spine x-ray 11/13/2021  . History of hyperthyroidism   . History of radiation therapy    bilateral lungs - 10/27/18-11/02/18, Dr. Antony Blackbird  . History of radiation therapy 11/28/2015   left breast 10/30/2015-11/28/2015   Dr Antony Blackbird  . Hyperlipidemia   . Hypertension    states under control with  meds., has been on med. x "long time"  . Hypokalemia    from last physical.   . Hypothyroidism   . Nonfunctioning kidney    left  . Personal history of radiation therapy    2017  . Pulmonary nodules    Bilateral  . Radiation 10/30/15-11/28/15   left breast 42.72 Gy, boosted to 10 Gy  . Renal artery stenosis (HCC)   . Tobacco abuse   . Wears partial dentures    upper and lower    Past Surgical History:  Procedure Laterality Date  . ABDOMINAL ANGIOGRAM  02/18/2012   Procedure: ABDOMINAL ANGIOGRAM;  Surgeon: Runell Gess, MD;  Location: [facility] CATH LAB;  Service: Cardiovascular;;  . ABDOMINAL AORTAGRAM  07/04/2014  . ABDOMINAL HYSTERECTOMY  ~ 1977   partial  . APPENDECTOMY    . ARCH AORTOGRAM    . BREAST LUMPECTOMY Left 09/22/2015   Malignant  . CAROTID ANGIOGRAM N/A 02/18/2012   Procedure: CAROTID ANGIOGRAM;  Surgeon: Runell Gess, MD;  Location: [facility] CATH LAB;  Service: Cardiovascular;  Laterality: N/A;  . ENDARTERECTOMY  04/02/2012   Procedure: ENDARTERECTOMY CAROTID;  Surgeon: Nada Libman, MD;  Location: [facility] OR;  Service: Vascular;  Laterality: Right;  . FUDUCIAL PLACEMENT Bilateral 09/02/2018   Procedure: Placement Of Fiducial to right upper lobe & left upper lobe lung;  Surgeon: Leslye Peer, MD;  Location: [facility] OR;  Service: Thoracic;  Laterality: Bilateral;  . IR THORACENTESIS ASP PLEURAL SPACE W/IMG GUIDE  07/08/2018  . RADIOACTIVE SEED GUIDED PARTIAL MASTECTOMY WITH AXILLARY SENTINEL LYMPH NODE BIOPSY Left 09/22/2015   Procedure: INJECT BLUE DYE LEFT BREAST,RADIOACTIVE SEED GUIDED PARTIAL MASTECTOMY WITH AXILLARY SENTINEL LYMPH NODE BIOPSY;  Surgeon: Claud Kelp, MD;  Location: [facility];  Service: General;  Laterality: Left;  . RENAL ANGIOGRAM Left 06/08/2010   renal artery stent -  5x12 Genesis on Aviator balloon stent (Dr. Erlene Quan)  . RENAL ANGIOGRAM Right 07/04/2014   Procedure: RENAL ANGIOGRAM;  Surgeon: Runell Gess, MD;  Location: [facility] CATH  LAB;  Service: Cardiovascular;  Laterality: Right;  . RENAL ANGIOGRAM Right 08/22/2014   Procedure: RENAL ANGIOGRAM;  Surgeon: Runell Gess, MD;  Location: [facility] CATH LAB;  Service: Cardiovascular;  Laterality: Right;  . TONSILLECTOMY     as a child  . VIDEO BRONCHOSCOPY WITH ENDOBRONCHIAL NAVIGATION N/A 09/02/2018   Procedure: VIDEO BRONCHOSCOPY WITH ENDOBRONCHIAL NAVIGATION;  Surgeon: Leslye Peer, MD;  Location: [facility] OR;  Service: Thoracic;  Laterality: N/A;    Social History:  reports that she has been smoking cigarettes. She has a 20 pack-year smoking history. She has never used smokeless tobacco. She reports current alcohol use of about 4.0 standard drinks of alcohol per week. She reports that she does not use drugs.  No Known Allergies  Family History  Problem Relation Age of Onset  . Heart disease Mother        MI @ 22, died at 49  . Cancer Father   . Lung cancer Father   . Lung cancer Sister   . Breast cancer Paternal Aunt   . Heart disease Maternal Grandmother   .  Stroke Neg Hx      Prior to Admission medications   Medication Sig Start Date End Date Taking? Authorizing Provider  aspirin 81 MG tablet Take 1 tablet (81 mg total) by mouth daily. 03/21/20   Hongalgi, Maximino Greenland, MD  Budeson-Glycopyrrol-Formoterol (BREZTRI AEROSPHERE) 160-9-4.8 MCG/ACT AERO Inhale 2 puffs into the lungs in the morning and at bedtime. 06/12/21   Leslye Peer, MD  docusate sodium (COLACE) 100 MG capsule Take 1 capsule (100 mg total) by mouth 2 (two) times daily as needed for mild constipation. 08/18/23   Pokhrel, Rebekah Chesterfield, MD  doxazosin (CARDURA) 8 MG tablet Take 8 mg by mouth daily.    [provider]  feeding supplement (ENSURE ENLIVE / ENSURE PLUS) LIQD Take 237 mLs by mouth 2 (two) times daily between meals. 08/18/23   Pokhrel, Rebekah Chesterfield, MD  furosemide (LASIX) 20 MG tablet Take 20 mg by mouth daily.    [provider]  ipratropium-albuterol (DUONEB) 0.5-2.5 (3) MG/3ML SOLN Take 3  mLs by nebulization every 4 (four) hours as needed (shortness of breath wheezing). 08/18/23   Pokhrel, Rebekah Chesterfield, MD  levETIRAcetam (KEPPRA) 500 MG tablet Take 1 tablet (500 mg total) by mouth 2 (two) times daily. 05/20/23   Lomax, Amy, NP  levothyroxine (SYNTHROID) 88 MCG tablet Take 88 mcg by mouth daily before breakfast.    [provider]  metoprolol succinate (TOPROL-XL) 50 MG 24 hr tablet Take 1 tablet (50 mg total) by mouth daily. Take with or immediately following a meal. 07/18/23   Pokhrel, Laxman, MD  Multiple Vitamin (MULTIVITAMIN WITH MINERALS) TABS tablet Take 1 tablet by mouth daily. 08/19/23   Pokhrel, Rebekah Chesterfield, MD  OXYGEN Inhale 2-4 L into the lungs as needed. As needed to maintain SATS > 90%    [provider]  pantoprazole (PROTONIX) 40 MG tablet Take 1 tablet (40 mg total) by mouth daily. 07/14/18   Darlin Drop, DO  rosuvastatin (CRESTOR) 10 MG tablet Take 10 mg by mouth daily.    [provider]  topiramate (TOPAMAX) 50 MG tablet Take 1 tablet (50 mg total) by mouth 2 (two) times daily. 08/18/23   Joycelyn Das, MD    Physical Exam: Vitals:   08/21/23 1920 08/21/23 1930 08/21/23 2023 08/21/23 2300  BP:  111/84 125/64 (!) 81/46  Pulse:  66 64 65  Resp:  16 16 16   Temp:      TempSrc:      SpO2:   94% 100%  Weight: 37 kg      Constitutional: Chronically ill-appearing cachectic woman resting in bed, no acute distress Eyes: EOMI, lids and conjunctivae normal ENMT: Mucous membranes are moist. Posterior pharynx clear of any exudate or lesions.Normal dentition.  Neck: normal, supple, no masses. Respiratory: Distant breath sounds. Normal respiratory effort while on 6 L O2 [place]. No accessory muscle use.  Cardiovascular: Regular rate and rhythm, no murmurs / rubs / gallops. No extremity edema. 2+ pedal pulses. Abdomen: no tenderness, no masses palpated. Musculoskeletal: no clubbing / cyanosis. No joint deformity upper and lower extremities.  Thin  extremities with muscle wasting throughout Skin: no rashes, lesions, ulcers. No induration Neurologic: Speech is slow but fluent, otherwise CN 2-12 grossly intact. Sensation intact. Strength equal bilaterally. Psychiatric: Flat affect.  Speaks in short sentences but oriented to self, place, year, and situation at time of admission compared to only oriented to self on arrival per report.  EKG: Personally reviewed. Sinus rhythm, rate 69, PVCs present.  No acute ischemic  changes.  PVCs are new when compared to previous.  Assessment/Plan Principal Problem:   Acute on chronic respiratory failure with hypoxia (HCC) Active Problems:   COPD with acute exacerbation (HCC)   Peripheral arterial disease (HCC)   Essential hypertension   Hyperlipidemia   Normocytic anemia   Seizure disorder (HCC)   Hypothyroidism   Chronic heart failure with preserved ejection fraction (HFpEF) (HCC)   History of stroke   Kendra Watts is a 78 y.o. female with medical history significant for COPD, chronic hypoxic respiratory failure on 2-3 L O2 via [place], chronic HFpEF, PAD with renal artery stenosis (s/p stenting to left renal artery 2011 and right renal artery 2016), carotid stenosis (s/p right CEA in 2013), bilateral iliac disease, lung cancer s/p SBRT, history of left occipital stroke with hemorrhagic transformation, seizures on Keppra, HTN, HLD, hypothyroidism, recent NSTEMI December 2024 (managed medically), chronic anemia who is admitted for acute on chronic hypoxic respiratory failure in setting of COPD and suspected developing pneumonia. *** Assessment and Plan: Acute on chronic hypoxic respiratory failure due to COPD and suspected pneumonia: ***  Acute encephalopathy: ***  History of left occipital stroke with hemorrhagic transformation: ***  Seizure disorder: ***  Chronic HFpEF/recent NSTEMI: ***  Peripheral artery disease: ***  Chronic normocytic  anemia: ***  Hypertension: ***  Hyperlipidemia: ***  Hypothyroidism: ***  History of lung cancer s/p SBRT Left lower lobe pulmonary nodule: Recent imaging 07/11/2023 with enlarging left lower lobe nodule suspicious for metastatic disease.  She follows with pulmonology Dr. Delton Coombes.  Plan is for outpatient follow-up with PET/CT and repeat CT chest at 40-month interval.    DVT prophylaxis: ***  Code Status: ***  Family Communication: ***  Disposition Plan: ***  Consults called: ***  Severity of Illness: {Observation/Inpatient:21159}  Darreld Mclean MD [facility]  If 7PM-7AM, please contact night-coverage [email or web address]  08/22/2023, 12:05 AM

## 2023-08-22 NOTE — Discharge Summary (Signed)
Physician Discharge Summary   Patient: Kendra Watts MRN: [id number] DOB: [birth year]  Admit date:     08/21/2023  Discharge date: 08/22/23  Discharge Physician: Arnetha Courser   PCP: Georgianne Fick, MD   Recommendations at discharge:  Please obtain CBC and BMP and follow-up Patient need a repeat CT chest or a PET scan to rule out any underlying malignancy in about 3 to 4 weeks. Please complete antibiotics as directed Follow-up with primary care provider within a week Follow-up with pulmonology  Discharge Diagnoses: Principal Problem:   Acute on chronic respiratory failure with hypoxia (HCC) Active Problems:   COPD with acute exacerbation (HCC)   Peripheral arterial disease (HCC)   Essential hypertension   Hyperlipidemia   Normocytic anemia   Seizure disorder (HCC)   Hypothyroidism   Chronic heart failure with preserved ejection fraction (HFpEF) (HCC)   History of stroke   Community acquired pneumonia  Resolved Problems:   * No resolved hospital problems. *  Hospital Course: Kendra Watts is a 78 y.o. female with medical history significant for COPD, chronic hypoxic respiratory failure on 2-3 L O2 via [place], chronic HFpEF, PAD with renal artery stenosis (s/p stenting to left renal artery 2011 and right renal artery 2016), carotid stenosis (s/p right CEA in 2013), bilateral iliac disease, lung cancer s/p SBRT, history of left occipital stroke with hemorrhagic transformation, seizures on Keppra, HTN, HLD, hypothyroidism, recent NSTEMI December 2024 (managed medically), chronic anemia who is admitted for acute on chronic hypoxic respiratory failure in setting of COPD and suspected developing pneumonia.  Patient with two recent admissions: 07/11/2023-07/18/2023 for NSTEMI managed medically on heparin drip.  Seen by cardiology and not considered candidate for aggressive interventions.   08/08/2023-08/18/2023 after found obtunded at her SNF.  Required intubation and admitted to ICU.   She was treated for for possible aspiration and extubated.  She underwent long-term EEG and MRI brain which were negative for acute findings.  She was continued on Keppra and Topamax.  She was discharged back to SNF on 3 L O2 via .  On presentation vitals were stable on 5 L of oxygen.  Labs with WBC 10.6, hemoglobin 8.3, BNP 281, troponin 17, ammonia not 33, VBG pH 7.38, pCO2 70, pO2 50. TSH 13.634, normal free T4.  UA negative for UTI.SARS-CoV-2, influenza, RSV PCR negative.   CT head without contrast negative for acute intracranial process.  Atrophy with chronic microvascular schema changes noted.   2 view chest x-ray showed slightly increased opacities in the mid left lung and perihilar region on the right, possible edema or infiltrate.  Underlying malignancy cannot be excluded.  Suspected small right pleural effusion.  Patient was started on ceftriaxone and Zithromax along with steroid.  1/31: Vital stable, saturating 100% on 3 L which is her baseline.  Labs with potassium of 3.2 which is being replaced.  Mentation improved back to baseline. Concern of some COPD exacerbation for which she was started on prednisone for total of 5 days.  She received ceftriaxone and Zithromax while in the hospital and is being discharged on Augmentin and Zithromax to complete a 5-day course for concern of any underlying pneumonia.  Patient need further investigation as outpatient with a repeat CT chest or a PET scan to rule out any underlying malignancy.  Patient had little softer blood pressure.  Home Cardura was discontinued and she will continue with Lasix and metoprolol.  Patient need to have a close follow-up with her primary care provider and they  can restart Cardura as needed.  Patient will continue the rest of her home medications and follow-up with her providers for further management.   Consultants: None Procedures performed: None Disposition: Skilled nursing facility Diet recommendation:   Discharge Diet Orders (From admission, onward)     Start     Ordered   08/22/23 0000  Diet - low sodium heart healthy        08/22/23 1200           Cardiac diet DISCHARGE MEDICATION: Allergies as of 08/22/2023   No Known Allergies      Medication List     STOP taking these medications    doxazosin 8 MG tablet Commonly known as: CARDURA       TAKE these medications    amoxicillin-clavulanate 875-125 MG tablet Commonly known as: AUGMENTIN Take 1 tablet by mouth 2 (two) times daily for 4 days.   aspirin 81 MG tablet Take 1 tablet (81 mg total) by mouth daily.   azithromycin 250 MG tablet Commonly known as: ZITHROMAX 1 tablet daily for next 4 days   Breztri Aerosphere 160-9-4.8 MCG/ACT Aero Generic drug: Budeson-Glycopyrrol-Formoterol Inhale 2 puffs into the lungs in the morning and at bedtime.   docusate sodium 100 MG capsule Commonly known as: COLACE Take 1 capsule (100 mg total) by mouth 2 (two) times daily as needed for mild constipation.   feeding supplement Liqd Take 237 mLs by mouth 2 (two) times daily between meals.   furosemide 20 MG tablet Commonly known as: LASIX Take 20 mg by mouth daily.   ipratropium-albuterol 0.5-2.5 (3) MG/3ML Soln Commonly known as: DUONEB Take 3 mLs by nebulization every 4 (four) hours as needed (shortness of breath wheezing).   levETIRAcetam 500 MG tablet Commonly known as: KEPPRA Take 1 tablet (500 mg total) by mouth 2 (two) times daily.   levothyroxine 88 MCG tablet Commonly known as: SYNTHROID Take 88 mcg by mouth daily before breakfast.   LORazepam 0.5 MG tablet Commonly known as: ATIVAN Take 0.5 mg by mouth every 6 (six) hours as needed for anxiety.   metoprolol succinate 50 MG 24 hr tablet Commonly known as: TOPROL-XL Take 1 tablet (50 mg total) by mouth daily. Take with or immediately following a meal.   multivitamin with minerals Tabs tablet Take 1 tablet by mouth daily.   OXYGEN Inhale 2-4 L  into the lungs as needed. As needed to maintain SATS > 90%   pantoprazole 40 MG tablet Commonly known as: PROTONIX Take 1 tablet (40 mg total) by mouth daily.   predniSONE 20 MG tablet Commonly known as: DELTASONE Take 2 tablets (40 mg total) by mouth daily at 6 (six) AM for 5 days. Start taking on: August 23, 2023   rosuvastatin 10 MG tablet Commonly known as: CRESTOR Take 10 mg by mouth daily.   topiramate 50 MG tablet Commonly known as: TOPAMAX Take 1 tablet (50 mg total) by mouth 2 (two) times daily.        Follow-up Information     Georgianne Fick, MD. Schedule an appointment as soon as possible for a visit in 1 week(s).   Specialty: Internal Medicine Contact information: [address] [place] [address] [place] [address] [phone]                Discharge Exam: Ceasar Mons Weights   08/21/23 1920  Weight: 37 kg   General.  Frail and severely malnourished elderly lady, in no acute distress. Pulmonary.  Lungs clear bilaterally, normal  respiratory effort. CV.  Regular rate and rhythm, no JVD, rub or murmur. Abdomen.  Soft, nontender, nondistended, BS positive. CNS.  Alert and oriented x 2.  No focal neurologic deficit. Extremities.  No edema, no cyanosis, pulses intact and symmetrical.  Condition at discharge: stable  The results of significant diagnostics from this hospitalization (including imaging, microbiology, ancillary and laboratory) are listed below for reference.   Imaging Studies: DG Chest 2 View Result Date: 08/21/2023 CLINICAL DATA:  Altered mental status.  History of CHF and COPD. EXAM: CHEST - 2 VIEW COMPARISON:  08/09/2023, 07/11/2023. FINDINGS: The heart size and mediastinal contours are stable. Stable opacities with fiducial markers are noted in the upper lobes bilaterally. Slightly increased scattered opacities are noted in the mid left lung and perihilar region on the right. There is blunting of the right costophrenic angle, possible  small right pleural effusion. No acute osseous abnormality is seen. Surgical clips are noted in the left breast. IMPRESSION: 1. Slightly increased opacities in the mid left lung and perihilar region on the right, possible edema or infiltrate. The possibility of underlying malignancy can not be excluded. 2. Suspected small right pleural effusion. 3. Remaining findings are unchanged. Electronically Signed   By: Thornell Sartorius M.D.   On: 08/21/2023 20:50   CT Head Wo Contrast Result Date: 08/21/2023 CLINICAL DATA:  Delirium. EXAM: CT HEAD WITHOUT CONTRAST TECHNIQUE: Contiguous axial images were obtained from the base of the skull through the vertex without intravenous contrast. RADIATION DOSE REDUCTION: This exam was performed according to the departmental dose-optimization program which includes automated exposure control, adjustment of the mA and/or kV according to patient size and/or use of iterative reconstruction technique. COMPARISON:  08/08/2023. FINDINGS: Brain: No acute intracranial hemorrhage, midline shift or mass effect. No extra-axial fluid collection is seen. Diffuse atrophy is noted. Periventricular white matter hypodensities are noted bilaterally. No hydrocephalus. Vascular: No hyperdense vessel or unexpected calcification. Skull: Normal. Negative for fracture or focal lesion. Sinuses/Orbits: No acute finding. Other: None. IMPRESSION: 1. No acute intracranial process. 2. Atrophy with chronic microvascular ischemic changes. Electronically Signed   By: Thornell Sartorius M.D.   On: 08/21/2023 20:44   MR BRAIN W WO CONTRAST Result Date: 08/10/2023 CLINICAL DATA:  Mental status change of unknown cause. Possible seizure. History of breast cancer. EXAM: MRI HEAD WITHOUT AND WITH CONTRAST TECHNIQUE: Multiplanar, multiecho pulse sequences of the brain and surrounding structures were obtained without and with intravenous contrast. CONTRAST:  4mL GADAVIST GADOBUTROL 1 MMOL/ML IV SOLN COMPARISON:  Head CT two days  ago.  MRI 11/13/2021. FINDINGS: Brain: Diffusion imaging does not show any acute or subacute infarction or other cause of restricted diffusion. Mild chronic small-vessel ischemic change affects the pons. Few old small vessel cerebellar infarctions. Resolution of a previously seen hemorrhage or hemorrhagic infarction in the inferior left occipital lobe. Presently, there is mild gliosis and hemosiderin deposition in this region. No evidence of mass effect or vasogenic edema. Second focus of hemosiderin deposition seen above that region in the left occipital lobe. Cerebral hemispheres otherwise show chronic small-vessel ischemic changes of the white, similar to the prior exam. No large vessel stroke. No sign of acute hemorrhage, hydrocephalus or extra-axial collection. Mesial temporal lobes appear symmetric and normal. After contrast administration, no abnormal brain or leptomeningeal enhancement occurs. Vascular: Major vessels at the base of the brain show flow. Skull and upper cervical spine: Negative Sinuses/Orbits: Clear/normal Other: None IMPRESSION: 1. No acute or reversible finding. No evidence of metastatic disease. 2.  Resolution of a previously seen hemorrhage or hemorrhagic infarction in the inferior left occipital lobe. Presently, there is mild gliosis and hemosiderin deposition in this region. No evidence of mass effect or vasogenic edema. Second focus of hemosiderin deposition seen above that region in the left occipital lobe, also without edema or enhancement. 3. Chronic small-vessel ischemic changes of the pons and cerebral hemispheric white matter, similar to the prior exam. Few old small vessel cerebellar infarctions. 4. Mesial temporal lobes appear normal. Electronically Signed   By: Paulina Fusi M.D.   On: 08/10/2023 14:25   DG Chest Port 1 View Result Date: 08/09/2023 CLINICAL DATA:  Intubation EXAM: PORTABLE CHEST 1 VIEW COMPARISON:  Yesterday FINDINGS: Interval tracheal and esophageal  extubation. Bands of opacity with fiducial markers over the upper lobes, volume loss and density greater on the right. Noted comments about possible metastatic disease on 07/11/2023 chest CT. Hyperinflation and interstitial coarsening bilaterally. Stable heart size and mediastinal contours given the pronounced degree of rotation. IMPRESSION: Stable aeration after extubation. Electronically Signed   By: Tiburcio Pea M.D.   On: 08/09/2023 06:06   Overnight EEG with video Result Date: 08/09/2023 Charlsie Quest, MD     08/10/2023 10:26 AM Patient Name: KIRBI FARRUGIA MRN: [id number] Epilepsy Attending: Charlsie Quest Referring Physician/Provider: Erick Blinks, MD Duration: 08/08/2023 2356 to 08/09/2023 2356 Patient history: 78yo F with ams getting eeg to evaluate for seizure  Level of alertness: Awake/lethargic, asleep  AEDs during EEG study: LEV  Technical aspects: This EEG study was done with scalp electrodes positioned according to the 10-20 International system of electrode placement. Electrical activity was reviewed with band pass filter of 1-70Hz , sensitivity of 7 uV/mm, display speed of 39mm/sec with a 60Hz  notched filter applied as appropriate. EEG data were recorded continuously and digitally stored.  Video monitoring was available and reviewed as appropriate.  Description:  No clear posterior dominant rhythm was seen. Sleep was characterized by sleep spindles (12 to 14 Hz), maximal frontocentral region. EEG showed continuous generalized predominantly 5 to 7 Hz theta slowing admixed with intermittent generalized 2-3Hz  delta slowing, at times with triphasic morphology. Hyperventilation and photic stimulation were not performed.    ABNORMALITY - Continuous slow, generalized  IMPRESSION: This study is suggestive of moderate diffuse encephalopathy. No seizures or epileptiform discharges were seen throughout the recording.  Charlsie Quest   EEG adult Result Date: 08/08/2023 Charlsie Quest, MD      08/08/2023  2:08 PM Patient Name: ODIS WICKEY MRN: [id number] Epilepsy Attending: Charlsie Quest Referring Physician/Provider: Lorin Glass, MD Date: 08/07/2022 Duration: 26.10 mins Patient history: 78yo F with ams getting eeg to evaluate for seizure Level of alertness: Awake AEDs during EEG study: LEV Technical aspects: This EEG study was done with scalp electrodes positioned according to the 10-20 International system of electrode placement. Electrical activity was reviewed with band pass filter of 1-70Hz , sensitivity of 7 uV/mm, display speed of 69mm/sec with a 60Hz  notched filter applied as appropriate. EEG data were recorded continuously and digitally stored.  Video monitoring was available and reviewed as appropriate. Description: EEG showed continuous generalized predominantly 5 to 7 Hz theta slowing admixed with intermittent generalized 2-3Hz  delta slowing. Hyperventilation and photic stimulation were not performed.   ABNORMALITY - Continuous slow, generalized IMPRESSION: This study is suggestive of moderate diffuse encephalopathy. No seizures or epileptiform discharges were seen throughout the recording. Charlsie Quest   CT Head Wo Contrast Result Date: 08/08/2023 CLINICAL DATA:  Provided history: Mental status change, unknown cause. EXAM: CT HEAD WITHOUT CONTRAST TECHNIQUE: Contiguous axial images were obtained from the base of the skull through the vertex without intravenous contrast. RADIATION DOSE REDUCTION: This exam was performed according to the departmental dose-optimization program which includes automated exposure control, adjustment of the mA and/or kV according to patient size and/or use of iterative reconstruction technique. COMPARISON:  Head CT 07/12/2023.  Brain MRI 11/13/2021. FINDINGS: Brain: Generalized cerebral atrophy. Patchy and ill-defined hypoattenuation within the cerebral white matter, nonspecific but compatible with moderate chronic small vessel ischemic disease. There  is no acute intracranial hemorrhage. No demarcated cortical infarct. No extra-axial fluid collection. No evidence of an intracranial mass. No midline shift. Vascular: No hyperdense vessel.  Atherosclerotic calcifications. Skull: No calvarial fracture or aggressive osseous lesion. Sinuses/Orbits: No mass or acute finding within the imaged orbits. Mild mucosal thickening within the bilateral ethmoid, sphenoid and maxillary sinuses. IMPRESSION: 1. No evidence of an acute intracranial abnormality. 2. Parenchymal atrophy and chronic small vessel ischemic disease. 3. Mild paranasal sinus mucosal thickening. Electronically Signed   By: Jackey Loge D.O.   On: 08/08/2023 10:44   DG Chest Portable 1 View Result Date: 08/08/2023 CLINICAL DATA:  Status post intubation EXAM: PORTABLE CHEST 1 VIEW COMPARISON:  X-ray 07/11/2023 FINDINGS: ET tube in place with tip proximally 5 cm above the carina. Enteric tube with tip extending beneath the diaphragm. Overlapping cardiac leads and defibrillator pads. Stable cardiopericardial silhouette with calcified aorta. Persistent bilateral apical nodular and bandlike opacities with surgical clips. Also clips in the left axillary region. There is however diffuse increasing interstitial changes seen of the lungs. Question tiny right effusion. No pneumothorax. IMPRESSION: Developing interstitial changes seen of the lungs. Acute process is possible. Tiny right effusion. Hyperinflation with stable parenchymal opacity seen along the upper lung zones with a bandlike changes and nodularity and surgical changes. Please correlate with history. ET tube and enteric tube in place Electronically Signed   By: Karen Kays M.D.   On: 08/08/2023 10:30    Microbiology: Results for orders placed or performed during the hospital encounter of 08/21/23  Resp panel by RT-PCR (RSV, Flu A&B, Covid) Anterior Nasal Swab     Status: None   Collection Time: 08/21/23  8:40 PM   Specimen: Anterior Nasal Swab   Result Value Ref Range Status   SARS Coronavirus 2 by RT PCR NEGATIVE NEGATIVE Final    Comment: (NOTE) SARS-CoV-2 target nucleic acids are NOT DETECTED.  The SARS-CoV-2 RNA is generally detectable in upper respiratory specimens during the acute phase of infection. The lowest concentration of SARS-CoV-2 viral copies this assay can detect is 138 copies/mL. A negative result does not preclude SARS-Cov-2 infection and should not be used as the sole basis for treatment or other patient management decisions. A negative result may occur with  improper specimen collection/handling, submission of specimen other than nasopharyngeal swab, presence of viral mutation(s) within the areas targeted by this assay, and inadequate number of viral copies(<138 copies/mL). A negative result must be combined with clinical observations, patient history, and epidemiological information. The expected result is Negative.  Fact Sheet for Patients:  BloggerCourse.com  Fact Sheet for Healthcare Providers:  SeriousBroker.it  This test is no t yet approved or cleared by the [place] FDA and  has been authorized for detection and/or diagnosis of SARS-CoV-2 by FDA under an Emergency Use Authorization (EUA). This EUA will remain  in effect (meaning this test can be used) for the duration of  the COVID-19 declaration under Section 564(b)(1) of the Act, 21 U.S.C.section 360bbb-3(b)(1), unless the authorization is terminated  or revoked sooner.       Influenza A by PCR NEGATIVE NEGATIVE Final   Influenza B by PCR NEGATIVE NEGATIVE Final    Comment: (NOTE) The Xpert Xpress SARS-CoV-2/FLU/RSV plus assay is intended as an aid in the diagnosis of influenza from Nasopharyngeal swab specimens and should not be used as a sole basis for treatment. Nasal washings and aspirates are unacceptable for Xpert Xpress SARS-CoV-2/FLU/RSV testing.  Fact Sheet for  Patients: BloggerCourse.com  Fact Sheet for Healthcare Providers: SeriousBroker.it  This test is not yet approved or cleared by the [place] FDA and has been authorized for detection and/or diagnosis of SARS-CoV-2 by FDA under an Emergency Use Authorization (EUA). This EUA will remain in effect (meaning this test can be used) for the duration of the COVID-19 declaration under Section 564(b)(1) of the Act, 21 U.S.C. section 360bbb-3(b)(1), unless the authorization is terminated or revoked.     Resp Syncytial Virus by PCR NEGATIVE NEGATIVE Final    Comment: (NOTE) Fact Sheet for Patients: BloggerCourse.com  Fact Sheet for Healthcare Providers: SeriousBroker.it  This test is not yet approved or cleared by the [place] FDA and has been authorized for detection and/or diagnosis of SARS-CoV-2 by FDA under an Emergency Use Authorization (EUA). This EUA will remain in effect (meaning this test can be used) for the duration of the COVID-19 declaration under Section 564(b)(1) of the Act, 21 U.S.C. section 360bbb-3(b)(1), unless the authorization is terminated or revoked.  Performed at [facility], [address], [place], [place] [address]     Labs: CBC: Recent Labs  Lab 08/16/23 [phone] 08/18/23 0649 08/21/23 2015 08/22/23 0558  WBC 11.4* 10.5 10.6* 8.3  NEUTROABS  --   --  7.7  --   HGB 8.1* 8.2* 8.3* 7.3*  HCT 26.9* 28.1* 28.2* 26.0*  MCV 93.7 94.9 95.9 99.6  PLT 258 283 266 285   Basic Metabolic Panel: Recent Labs  Lab 08/16/23 0726 08/18/23 0649 08/21/23 2015 08/22/23 0558  NA 143 142 142 143  K 4.1 4.2 4.2 3.2*  CL 102 101 99 107  CO2 34* 33* 35* 31  GLUCOSE 101* 103* 112* 75  BUN 18 23 22 18   CREATININE 1.10* 0.73 0.87 0.66  CALCIUM 8.7* 8.7* 8.5* 7.5*   Liver Function Tests: No results for input(s): "AST", "ALT", "ALKPHOS",  "BILITOT", "PROT", "ALBUMIN" in the last 168 hours. CBG: No results for input(s): "GLUCAP" in the last 168 hours.  Discharge time spent: greater than 30 minutes.  This record has been created using Conservation officer, historic buildings. Errors have been sought and corrected,but may not always be located. Such creation errors do not reflect on the standard of care.   Signed: Arnetha Courser, MD [facility] 08/22/2023

## 2023-08-22 NOTE — ED Notes (Signed)
Patient unable to follow commands to take medicine. Patient might need Swallow eval for to prevent aspiration.

## 2023-08-22 NOTE — ED Notes (Signed)
[facility] called to setup transportation

## 2023-08-22 NOTE — Hospital Course (Addendum)
Kendra Watts is a 78 y.o. female with medical history significant for COPD, chronic hypoxic respiratory failure on 2-3 L O2 via [place], chronic HFpEF, PAD with renal artery stenosis (s/p stenting to left renal artery 2011 and right renal artery 2016), carotid stenosis (s/p right CEA in 2013), bilateral iliac disease, lung cancer s/p SBRT, history of left occipital stroke with hemorrhagic transformation, seizures on Keppra, HTN, HLD, hypothyroidism, recent NSTEMI December 2024 (managed medically), chronic anemia who is admitted for acute on chronic hypoxic respiratory failure in setting of COPD and suspected developing pneumonia.  Patient with two recent admissions: 07/11/2023-07/18/2023 for NSTEMI managed medically on heparin drip.  Seen by cardiology and not considered candidate for aggressive interventions.   08/08/2023-08/18/2023 after found obtunded at her SNF.  Required intubation and admitted to ICU.  She was treated for for possible aspiration and extubated.  She underwent long-term EEG and MRI brain which were negative for acute findings.  She was continued on Keppra and Topamax.  She was discharged back to SNF on 3 L O2 via [place].  On presentation vitals were stable on 5 L of oxygen.  Labs with WBC 10.6, hemoglobin 8.3, BNP 281, troponin 17, ammonia not 33, VBG pH 7.38, pCO2 70, pO2 50. TSH 13.634, normal free T4.  UA negative for UTI.SARS-CoV-2, influenza, RSV PCR negative.   CT head without contrast negative for acute intracranial process.  Atrophy with chronic microvascular schema changes noted.   2 view chest x-ray showed slightly increased opacities in the mid left lung and perihilar region on the right, possible edema or infiltrate.  Underlying malignancy cannot be excluded.  Suspected small right pleural effusion.  Patient was started on ceftriaxone and Zithromax along with steroid.  1/31: Vital stable, saturating 100% on 3 L which is her baseline.  Labs with potassium of 3.2 which is being  replaced.  Mentation improved back to baseline. Concern of some COPD exacerbation for which she was started on prednisone for total of 5 days.  She received ceftriaxone and Zithromax while in the hospital and is being discharged on Augmentin and Zithromax to complete a 5-day course for concern of any underlying pneumonia.  Patient need further investigation as outpatient with a repeat CT chest or a PET scan to rule out any underlying malignancy.  Patient had little softer blood pressure.  Home Cardura was discontinued and she will continue with Lasix and metoprolol.  Patient need to have a close follow-up with her primary care provider and they can restart Cardura as needed.  Patient will continue the rest of her home medications and follow-up with her providers for further management.

## 2023-08-22 NOTE — Progress Notes (Signed)
CSW spoke with Bjorn Loser to inquire about pt returning to SNF and Bjorn Loser stated pt is good to return today. RN to call transport and report. No further TOC needs.

## 2023-08-22 NOTE — ED Notes (Signed)
Attempted to give report to [facility] nurse, stated they will call back

## 2023-08-23 LAB — STREP PNEUMONIAE URINARY ANTIGEN: Strep Pneumo Urinary Antigen: NEGATIVE

## 2023-08-24 LAB — LEVETIRACETAM LEVEL: Levetiracetam Lvl: 26.2 ug/mL (ref 10.0–40.0)

## 2023-08-25 DIAGNOSIS — J441 Chronic obstructive pulmonary disease with (acute) exacerbation: Secondary | ICD-10-CM | POA: Diagnosis not present

## 2023-08-25 DIAGNOSIS — I214 Non-ST elevation (NSTEMI) myocardial infarction: Secondary | ICD-10-CM | POA: Diagnosis not present

## 2023-08-25 DIAGNOSIS — C3491 Malignant neoplasm of unspecified part of right bronchus or lung: Secondary | ICD-10-CM | POA: Diagnosis not present

## 2023-08-25 DIAGNOSIS — C3492 Malignant neoplasm of unspecified part of left bronchus or lung: Secondary | ICD-10-CM | POA: Diagnosis not present

## 2023-08-25 DIAGNOSIS — C50412 Malignant neoplasm of upper-outer quadrant of left female breast: Secondary | ICD-10-CM | POA: Diagnosis not present

## 2023-08-25 DIAGNOSIS — I619 Nontraumatic intracerebral hemorrhage, unspecified: Secondary | ICD-10-CM | POA: Diagnosis not present

## 2023-08-25 DIAGNOSIS — E43 Unspecified severe protein-calorie malnutrition: Secondary | ICD-10-CM | POA: Diagnosis not present

## 2023-08-25 DIAGNOSIS — I6529 Occlusion and stenosis of unspecified carotid artery: Secondary | ICD-10-CM | POA: Diagnosis not present

## 2023-08-25 DIAGNOSIS — I69351 Hemiplegia and hemiparesis following cerebral infarction affecting right dominant side: Secondary | ICD-10-CM | POA: Diagnosis not present

## 2023-08-25 DIAGNOSIS — J449 Chronic obstructive pulmonary disease, unspecified: Secondary | ICD-10-CM | POA: Diagnosis not present

## 2023-08-25 DIAGNOSIS — I5033 Acute on chronic diastolic (congestive) heart failure: Secondary | ICD-10-CM | POA: Diagnosis not present

## 2023-08-25 DIAGNOSIS — I701 Atherosclerosis of renal artery: Secondary | ICD-10-CM | POA: Diagnosis not present

## 2023-08-26 DIAGNOSIS — I6529 Occlusion and stenosis of unspecified carotid artery: Secondary | ICD-10-CM | POA: Diagnosis not present

## 2023-08-26 DIAGNOSIS — I5033 Acute on chronic diastolic (congestive) heart failure: Secondary | ICD-10-CM | POA: Diagnosis not present

## 2023-08-26 DIAGNOSIS — I69351 Hemiplegia and hemiparesis following cerebral infarction affecting right dominant side: Secondary | ICD-10-CM | POA: Diagnosis not present

## 2023-08-26 DIAGNOSIS — I214 Non-ST elevation (NSTEMI) myocardial infarction: Secondary | ICD-10-CM | POA: Diagnosis not present

## 2023-08-26 DIAGNOSIS — E43 Unspecified severe protein-calorie malnutrition: Secondary | ICD-10-CM | POA: Diagnosis not present

## 2023-08-26 DIAGNOSIS — J449 Chronic obstructive pulmonary disease, unspecified: Secondary | ICD-10-CM | POA: Diagnosis not present

## 2023-08-26 DIAGNOSIS — J441 Chronic obstructive pulmonary disease with (acute) exacerbation: Secondary | ICD-10-CM | POA: Diagnosis not present

## 2023-08-26 DIAGNOSIS — C50412 Malignant neoplasm of upper-outer quadrant of left female breast: Secondary | ICD-10-CM | POA: Diagnosis not present

## 2023-08-26 DIAGNOSIS — C3492 Malignant neoplasm of unspecified part of left bronchus or lung: Secondary | ICD-10-CM | POA: Diagnosis not present

## 2023-08-26 DIAGNOSIS — I619 Nontraumatic intracerebral hemorrhage, unspecified: Secondary | ICD-10-CM | POA: Diagnosis not present

## 2023-08-26 DIAGNOSIS — C3491 Malignant neoplasm of unspecified part of right bronchus or lung: Secondary | ICD-10-CM | POA: Diagnosis not present

## 2023-08-26 DIAGNOSIS — I701 Atherosclerosis of renal artery: Secondary | ICD-10-CM | POA: Diagnosis not present

## 2023-08-27 DIAGNOSIS — I214 Non-ST elevation (NSTEMI) myocardial infarction: Secondary | ICD-10-CM | POA: Diagnosis not present

## 2023-08-27 DIAGNOSIS — C3491 Malignant neoplasm of unspecified part of right bronchus or lung: Secondary | ICD-10-CM | POA: Diagnosis not present

## 2023-08-27 DIAGNOSIS — I701 Atherosclerosis of renal artery: Secondary | ICD-10-CM | POA: Diagnosis not present

## 2023-08-27 DIAGNOSIS — I619 Nontraumatic intracerebral hemorrhage, unspecified: Secondary | ICD-10-CM | POA: Diagnosis not present

## 2023-08-27 DIAGNOSIS — I5033 Acute on chronic diastolic (congestive) heart failure: Secondary | ICD-10-CM | POA: Diagnosis not present

## 2023-08-27 DIAGNOSIS — J449 Chronic obstructive pulmonary disease, unspecified: Secondary | ICD-10-CM | POA: Diagnosis not present

## 2023-08-27 DIAGNOSIS — I69351 Hemiplegia and hemiparesis following cerebral infarction affecting right dominant side: Secondary | ICD-10-CM | POA: Diagnosis not present

## 2023-08-27 DIAGNOSIS — C50412 Malignant neoplasm of upper-outer quadrant of left female breast: Secondary | ICD-10-CM | POA: Diagnosis not present

## 2023-08-27 DIAGNOSIS — C3492 Malignant neoplasm of unspecified part of left bronchus or lung: Secondary | ICD-10-CM | POA: Diagnosis not present

## 2023-08-27 DIAGNOSIS — I6529 Occlusion and stenosis of unspecified carotid artery: Secondary | ICD-10-CM | POA: Diagnosis not present

## 2023-08-27 DIAGNOSIS — E43 Unspecified severe protein-calorie malnutrition: Secondary | ICD-10-CM | POA: Diagnosis not present

## 2023-08-27 DIAGNOSIS — J441 Chronic obstructive pulmonary disease with (acute) exacerbation: Secondary | ICD-10-CM | POA: Diagnosis not present

## 2023-08-29 DIAGNOSIS — J441 Chronic obstructive pulmonary disease with (acute) exacerbation: Secondary | ICD-10-CM | POA: Diagnosis not present

## 2023-08-29 DIAGNOSIS — I214 Non-ST elevation (NSTEMI) myocardial infarction: Secondary | ICD-10-CM | POA: Diagnosis not present

## 2023-08-29 DIAGNOSIS — C3491 Malignant neoplasm of unspecified part of right bronchus or lung: Secondary | ICD-10-CM | POA: Diagnosis not present

## 2023-08-29 DIAGNOSIS — I619 Nontraumatic intracerebral hemorrhage, unspecified: Secondary | ICD-10-CM | POA: Diagnosis not present

## 2023-08-29 DIAGNOSIS — C50412 Malignant neoplasm of upper-outer quadrant of left female breast: Secondary | ICD-10-CM | POA: Diagnosis not present

## 2023-08-29 DIAGNOSIS — I5033 Acute on chronic diastolic (congestive) heart failure: Secondary | ICD-10-CM | POA: Diagnosis not present

## 2023-08-29 DIAGNOSIS — I6529 Occlusion and stenosis of unspecified carotid artery: Secondary | ICD-10-CM | POA: Diagnosis not present

## 2023-08-29 DIAGNOSIS — J449 Chronic obstructive pulmonary disease, unspecified: Secondary | ICD-10-CM | POA: Diagnosis not present

## 2023-08-29 DIAGNOSIS — I69351 Hemiplegia and hemiparesis following cerebral infarction affecting right dominant side: Secondary | ICD-10-CM | POA: Diagnosis not present

## 2023-08-29 DIAGNOSIS — E43 Unspecified severe protein-calorie malnutrition: Secondary | ICD-10-CM | POA: Diagnosis not present

## 2023-08-29 DIAGNOSIS — C3492 Malignant neoplasm of unspecified part of left bronchus or lung: Secondary | ICD-10-CM | POA: Diagnosis not present

## 2023-08-29 DIAGNOSIS — I701 Atherosclerosis of renal artery: Secondary | ICD-10-CM | POA: Diagnosis not present

## 2023-08-31 DIAGNOSIS — E43 Unspecified severe protein-calorie malnutrition: Secondary | ICD-10-CM | POA: Diagnosis not present

## 2023-08-31 DIAGNOSIS — C50412 Malignant neoplasm of upper-outer quadrant of left female breast: Secondary | ICD-10-CM | POA: Diagnosis not present

## 2023-08-31 DIAGNOSIS — I6529 Occlusion and stenosis of unspecified carotid artery: Secondary | ICD-10-CM | POA: Diagnosis not present

## 2023-08-31 DIAGNOSIS — I701 Atherosclerosis of renal artery: Secondary | ICD-10-CM | POA: Diagnosis not present

## 2023-08-31 DIAGNOSIS — J441 Chronic obstructive pulmonary disease with (acute) exacerbation: Secondary | ICD-10-CM | POA: Diagnosis not present

## 2023-08-31 DIAGNOSIS — I619 Nontraumatic intracerebral hemorrhage, unspecified: Secondary | ICD-10-CM | POA: Diagnosis not present

## 2023-08-31 DIAGNOSIS — I5033 Acute on chronic diastolic (congestive) heart failure: Secondary | ICD-10-CM | POA: Diagnosis not present

## 2023-08-31 DIAGNOSIS — J449 Chronic obstructive pulmonary disease, unspecified: Secondary | ICD-10-CM | POA: Diagnosis not present

## 2023-08-31 DIAGNOSIS — I69351 Hemiplegia and hemiparesis following cerebral infarction affecting right dominant side: Secondary | ICD-10-CM | POA: Diagnosis not present

## 2023-08-31 DIAGNOSIS — C3492 Malignant neoplasm of unspecified part of left bronchus or lung: Secondary | ICD-10-CM | POA: Diagnosis not present

## 2023-08-31 DIAGNOSIS — I214 Non-ST elevation (NSTEMI) myocardial infarction: Secondary | ICD-10-CM | POA: Diagnosis not present

## 2023-08-31 DIAGNOSIS — C3491 Malignant neoplasm of unspecified part of right bronchus or lung: Secondary | ICD-10-CM | POA: Diagnosis not present

## 2023-09-01 DIAGNOSIS — I6529 Occlusion and stenosis of unspecified carotid artery: Secondary | ICD-10-CM | POA: Diagnosis not present

## 2023-09-01 DIAGNOSIS — D500 Iron deficiency anemia secondary to blood loss (chronic): Secondary | ICD-10-CM | POA: Diagnosis not present

## 2023-09-01 DIAGNOSIS — J449 Chronic obstructive pulmonary disease, unspecified: Secondary | ICD-10-CM | POA: Diagnosis not present

## 2023-09-01 DIAGNOSIS — I69351 Hemiplegia and hemiparesis following cerebral infarction affecting right dominant side: Secondary | ICD-10-CM | POA: Diagnosis not present

## 2023-09-01 DIAGNOSIS — I5033 Acute on chronic diastolic (congestive) heart failure: Secondary | ICD-10-CM | POA: Diagnosis not present

## 2023-09-01 DIAGNOSIS — C50412 Malignant neoplasm of upper-outer quadrant of left female breast: Secondary | ICD-10-CM | POA: Diagnosis not present

## 2023-09-01 DIAGNOSIS — I214 Non-ST elevation (NSTEMI) myocardial infarction: Secondary | ICD-10-CM | POA: Diagnosis not present

## 2023-09-01 DIAGNOSIS — Z515 Encounter for palliative care: Secondary | ICD-10-CM | POA: Diagnosis not present

## 2023-09-01 DIAGNOSIS — C3492 Malignant neoplasm of unspecified part of left bronchus or lung: Secondary | ICD-10-CM | POA: Diagnosis not present

## 2023-09-01 DIAGNOSIS — J441 Chronic obstructive pulmonary disease with (acute) exacerbation: Secondary | ICD-10-CM | POA: Diagnosis not present

## 2023-09-01 DIAGNOSIS — C3491 Malignant neoplasm of unspecified part of right bronchus or lung: Secondary | ICD-10-CM | POA: Diagnosis not present

## 2023-09-01 DIAGNOSIS — J9611 Chronic respiratory failure with hypoxia: Secondary | ICD-10-CM | POA: Diagnosis not present

## 2023-09-01 DIAGNOSIS — E43 Unspecified severe protein-calorie malnutrition: Secondary | ICD-10-CM | POA: Diagnosis not present

## 2023-09-01 DIAGNOSIS — I701 Atherosclerosis of renal artery: Secondary | ICD-10-CM | POA: Diagnosis not present

## 2023-09-01 DIAGNOSIS — I619 Nontraumatic intracerebral hemorrhage, unspecified: Secondary | ICD-10-CM | POA: Diagnosis not present

## 2023-09-03 DIAGNOSIS — I69351 Hemiplegia and hemiparesis following cerebral infarction affecting right dominant side: Secondary | ICD-10-CM | POA: Diagnosis not present

## 2023-09-03 DIAGNOSIS — I701 Atherosclerosis of renal artery: Secondary | ICD-10-CM | POA: Diagnosis not present

## 2023-09-03 DIAGNOSIS — J449 Chronic obstructive pulmonary disease, unspecified: Secondary | ICD-10-CM | POA: Diagnosis not present

## 2023-09-03 DIAGNOSIS — I6529 Occlusion and stenosis of unspecified carotid artery: Secondary | ICD-10-CM | POA: Diagnosis not present

## 2023-09-03 DIAGNOSIS — C3492 Malignant neoplasm of unspecified part of left bronchus or lung: Secondary | ICD-10-CM | POA: Diagnosis not present

## 2023-09-03 DIAGNOSIS — C3491 Malignant neoplasm of unspecified part of right bronchus or lung: Secondary | ICD-10-CM | POA: Diagnosis not present

## 2023-09-03 DIAGNOSIS — E43 Unspecified severe protein-calorie malnutrition: Secondary | ICD-10-CM | POA: Diagnosis not present

## 2023-09-03 DIAGNOSIS — J441 Chronic obstructive pulmonary disease with (acute) exacerbation: Secondary | ICD-10-CM | POA: Diagnosis not present

## 2023-09-03 DIAGNOSIS — I5033 Acute on chronic diastolic (congestive) heart failure: Secondary | ICD-10-CM | POA: Diagnosis not present

## 2023-09-03 DIAGNOSIS — I214 Non-ST elevation (NSTEMI) myocardial infarction: Secondary | ICD-10-CM | POA: Diagnosis not present

## 2023-09-03 DIAGNOSIS — I619 Nontraumatic intracerebral hemorrhage, unspecified: Secondary | ICD-10-CM | POA: Diagnosis not present

## 2023-09-03 DIAGNOSIS — C50412 Malignant neoplasm of upper-outer quadrant of left female breast: Secondary | ICD-10-CM | POA: Diagnosis not present

## 2023-09-05 DIAGNOSIS — C3491 Malignant neoplasm of unspecified part of right bronchus or lung: Secondary | ICD-10-CM | POA: Diagnosis not present

## 2023-09-05 DIAGNOSIS — I701 Atherosclerosis of renal artery: Secondary | ICD-10-CM | POA: Diagnosis not present

## 2023-09-05 DIAGNOSIS — J441 Chronic obstructive pulmonary disease with (acute) exacerbation: Secondary | ICD-10-CM | POA: Diagnosis not present

## 2023-09-05 DIAGNOSIS — I214 Non-ST elevation (NSTEMI) myocardial infarction: Secondary | ICD-10-CM | POA: Diagnosis not present

## 2023-09-05 DIAGNOSIS — C3492 Malignant neoplasm of unspecified part of left bronchus or lung: Secondary | ICD-10-CM | POA: Diagnosis not present

## 2023-09-05 DIAGNOSIS — I69351 Hemiplegia and hemiparesis following cerebral infarction affecting right dominant side: Secondary | ICD-10-CM | POA: Diagnosis not present

## 2023-09-05 DIAGNOSIS — E43 Unspecified severe protein-calorie malnutrition: Secondary | ICD-10-CM | POA: Diagnosis not present

## 2023-09-05 DIAGNOSIS — I6529 Occlusion and stenosis of unspecified carotid artery: Secondary | ICD-10-CM | POA: Diagnosis not present

## 2023-09-05 DIAGNOSIS — I619 Nontraumatic intracerebral hemorrhage, unspecified: Secondary | ICD-10-CM | POA: Diagnosis not present

## 2023-09-05 DIAGNOSIS — C50412 Malignant neoplasm of upper-outer quadrant of left female breast: Secondary | ICD-10-CM | POA: Diagnosis not present

## 2023-09-05 DIAGNOSIS — I5033 Acute on chronic diastolic (congestive) heart failure: Secondary | ICD-10-CM | POA: Diagnosis not present

## 2023-09-05 DIAGNOSIS — J449 Chronic obstructive pulmonary disease, unspecified: Secondary | ICD-10-CM | POA: Diagnosis not present

## 2023-09-07 DIAGNOSIS — C3492 Malignant neoplasm of unspecified part of left bronchus or lung: Secondary | ICD-10-CM | POA: Diagnosis not present

## 2023-09-07 DIAGNOSIS — I214 Non-ST elevation (NSTEMI) myocardial infarction: Secondary | ICD-10-CM | POA: Diagnosis not present

## 2023-09-07 DIAGNOSIS — E43 Unspecified severe protein-calorie malnutrition: Secondary | ICD-10-CM | POA: Diagnosis not present

## 2023-09-07 DIAGNOSIS — I6529 Occlusion and stenosis of unspecified carotid artery: Secondary | ICD-10-CM | POA: Diagnosis not present

## 2023-09-07 DIAGNOSIS — J449 Chronic obstructive pulmonary disease, unspecified: Secondary | ICD-10-CM | POA: Diagnosis not present

## 2023-09-07 DIAGNOSIS — C50412 Malignant neoplasm of upper-outer quadrant of left female breast: Secondary | ICD-10-CM | POA: Diagnosis not present

## 2023-09-07 DIAGNOSIS — I619 Nontraumatic intracerebral hemorrhage, unspecified: Secondary | ICD-10-CM | POA: Diagnosis not present

## 2023-09-07 DIAGNOSIS — I5033 Acute on chronic diastolic (congestive) heart failure: Secondary | ICD-10-CM | POA: Diagnosis not present

## 2023-09-07 DIAGNOSIS — C3491 Malignant neoplasm of unspecified part of right bronchus or lung: Secondary | ICD-10-CM | POA: Diagnosis not present

## 2023-09-07 DIAGNOSIS — I69351 Hemiplegia and hemiparesis following cerebral infarction affecting right dominant side: Secondary | ICD-10-CM | POA: Diagnosis not present

## 2023-09-07 DIAGNOSIS — I701 Atherosclerosis of renal artery: Secondary | ICD-10-CM | POA: Diagnosis not present

## 2023-09-07 DIAGNOSIS — J441 Chronic obstructive pulmonary disease with (acute) exacerbation: Secondary | ICD-10-CM | POA: Diagnosis not present

## 2023-09-08 DIAGNOSIS — J441 Chronic obstructive pulmonary disease with (acute) exacerbation: Secondary | ICD-10-CM | POA: Diagnosis not present

## 2023-09-08 DIAGNOSIS — J449 Chronic obstructive pulmonary disease, unspecified: Secondary | ICD-10-CM | POA: Diagnosis not present

## 2023-09-08 DIAGNOSIS — I6529 Occlusion and stenosis of unspecified carotid artery: Secondary | ICD-10-CM | POA: Diagnosis not present

## 2023-09-08 DIAGNOSIS — I214 Non-ST elevation (NSTEMI) myocardial infarction: Secondary | ICD-10-CM | POA: Diagnosis not present

## 2023-09-08 DIAGNOSIS — I69351 Hemiplegia and hemiparesis following cerebral infarction affecting right dominant side: Secondary | ICD-10-CM | POA: Diagnosis not present

## 2023-09-08 DIAGNOSIS — I5033 Acute on chronic diastolic (congestive) heart failure: Secondary | ICD-10-CM | POA: Diagnosis not present

## 2023-09-08 DIAGNOSIS — C3491 Malignant neoplasm of unspecified part of right bronchus or lung: Secondary | ICD-10-CM | POA: Diagnosis not present

## 2023-09-08 DIAGNOSIS — I701 Atherosclerosis of renal artery: Secondary | ICD-10-CM | POA: Diagnosis not present

## 2023-09-08 DIAGNOSIS — E43 Unspecified severe protein-calorie malnutrition: Secondary | ICD-10-CM | POA: Diagnosis not present

## 2023-09-08 DIAGNOSIS — C50412 Malignant neoplasm of upper-outer quadrant of left female breast: Secondary | ICD-10-CM | POA: Diagnosis not present

## 2023-09-08 DIAGNOSIS — C3492 Malignant neoplasm of unspecified part of left bronchus or lung: Secondary | ICD-10-CM | POA: Diagnosis not present

## 2023-09-08 DIAGNOSIS — I619 Nontraumatic intracerebral hemorrhage, unspecified: Secondary | ICD-10-CM | POA: Diagnosis not present

## 2023-09-09 DIAGNOSIS — F01B2 Vascular dementia, moderate, with psychotic disturbance: Secondary | ICD-10-CM | POA: Diagnosis not present

## 2023-09-09 DIAGNOSIS — F445 Conversion disorder with seizures or convulsions: Secondary | ICD-10-CM | POA: Diagnosis not present

## 2023-09-09 DIAGNOSIS — F064 Anxiety disorder due to known physiological condition: Secondary | ICD-10-CM | POA: Diagnosis not present

## 2023-09-09 DIAGNOSIS — F331 Major depressive disorder, recurrent, moderate: Secondary | ICD-10-CM | POA: Diagnosis not present

## 2023-09-10 DIAGNOSIS — C3492 Malignant neoplasm of unspecified part of left bronchus or lung: Secondary | ICD-10-CM | POA: Diagnosis not present

## 2023-09-10 DIAGNOSIS — I619 Nontraumatic intracerebral hemorrhage, unspecified: Secondary | ICD-10-CM | POA: Diagnosis not present

## 2023-09-10 DIAGNOSIS — J449 Chronic obstructive pulmonary disease, unspecified: Secondary | ICD-10-CM | POA: Diagnosis not present

## 2023-09-10 DIAGNOSIS — C3491 Malignant neoplasm of unspecified part of right bronchus or lung: Secondary | ICD-10-CM | POA: Diagnosis not present

## 2023-09-10 DIAGNOSIS — I5033 Acute on chronic diastolic (congestive) heart failure: Secondary | ICD-10-CM | POA: Diagnosis not present

## 2023-09-10 DIAGNOSIS — I6529 Occlusion and stenosis of unspecified carotid artery: Secondary | ICD-10-CM | POA: Diagnosis not present

## 2023-09-10 DIAGNOSIS — E43 Unspecified severe protein-calorie malnutrition: Secondary | ICD-10-CM | POA: Diagnosis not present

## 2023-09-10 DIAGNOSIS — I69351 Hemiplegia and hemiparesis following cerebral infarction affecting right dominant side: Secondary | ICD-10-CM | POA: Diagnosis not present

## 2023-09-10 DIAGNOSIS — I701 Atherosclerosis of renal artery: Secondary | ICD-10-CM | POA: Diagnosis not present

## 2023-09-10 DIAGNOSIS — C50412 Malignant neoplasm of upper-outer quadrant of left female breast: Secondary | ICD-10-CM | POA: Diagnosis not present

## 2023-09-10 DIAGNOSIS — J441 Chronic obstructive pulmonary disease with (acute) exacerbation: Secondary | ICD-10-CM | POA: Diagnosis not present

## 2023-09-10 DIAGNOSIS — I214 Non-ST elevation (NSTEMI) myocardial infarction: Secondary | ICD-10-CM | POA: Diagnosis not present

## 2023-09-11 ENCOUNTER — Telehealth: Payer: Self-pay | Admitting: Emergency Medicine

## 2023-09-11 DIAGNOSIS — C50412 Malignant neoplasm of upper-outer quadrant of left female breast: Secondary | ICD-10-CM | POA: Diagnosis not present

## 2023-09-11 DIAGNOSIS — I619 Nontraumatic intracerebral hemorrhage, unspecified: Secondary | ICD-10-CM | POA: Diagnosis not present

## 2023-09-11 DIAGNOSIS — C3492 Malignant neoplasm of unspecified part of left bronchus or lung: Secondary | ICD-10-CM | POA: Diagnosis not present

## 2023-09-11 DIAGNOSIS — E43 Unspecified severe protein-calorie malnutrition: Secondary | ICD-10-CM | POA: Diagnosis not present

## 2023-09-11 DIAGNOSIS — I214 Non-ST elevation (NSTEMI) myocardial infarction: Secondary | ICD-10-CM | POA: Diagnosis not present

## 2023-09-11 DIAGNOSIS — C3491 Malignant neoplasm of unspecified part of right bronchus or lung: Secondary | ICD-10-CM | POA: Diagnosis not present

## 2023-09-11 DIAGNOSIS — I69351 Hemiplegia and hemiparesis following cerebral infarction affecting right dominant side: Secondary | ICD-10-CM | POA: Diagnosis not present

## 2023-09-11 DIAGNOSIS — I701 Atherosclerosis of renal artery: Secondary | ICD-10-CM | POA: Diagnosis not present

## 2023-09-11 DIAGNOSIS — J449 Chronic obstructive pulmonary disease, unspecified: Secondary | ICD-10-CM | POA: Diagnosis not present

## 2023-09-11 DIAGNOSIS — J441 Chronic obstructive pulmonary disease with (acute) exacerbation: Secondary | ICD-10-CM | POA: Diagnosis not present

## 2023-09-11 DIAGNOSIS — I6529 Occlusion and stenosis of unspecified carotid artery: Secondary | ICD-10-CM | POA: Diagnosis not present

## 2023-09-11 DIAGNOSIS — I5033 Acute on chronic diastolic (congestive) heart failure: Secondary | ICD-10-CM | POA: Diagnosis not present

## 2023-09-11 NOTE — Telephone Encounter (Signed)
 Patient needs a PET Scan scheduled with a follow up with Dr.Byrum.

## 2023-09-12 DIAGNOSIS — F4322 Adjustment disorder with anxiety: Secondary | ICD-10-CM | POA: Diagnosis not present

## 2023-09-12 DIAGNOSIS — J441 Chronic obstructive pulmonary disease with (acute) exacerbation: Secondary | ICD-10-CM | POA: Diagnosis not present

## 2023-09-12 DIAGNOSIS — I5033 Acute on chronic diastolic (congestive) heart failure: Secondary | ICD-10-CM | POA: Diagnosis not present

## 2023-09-12 DIAGNOSIS — J449 Chronic obstructive pulmonary disease, unspecified: Secondary | ICD-10-CM | POA: Diagnosis not present

## 2023-09-12 DIAGNOSIS — I6529 Occlusion and stenosis of unspecified carotid artery: Secondary | ICD-10-CM | POA: Diagnosis not present

## 2023-09-12 DIAGNOSIS — C3491 Malignant neoplasm of unspecified part of right bronchus or lung: Secondary | ICD-10-CM | POA: Diagnosis not present

## 2023-09-12 DIAGNOSIS — E43 Unspecified severe protein-calorie malnutrition: Secondary | ICD-10-CM | POA: Diagnosis not present

## 2023-09-12 DIAGNOSIS — I619 Nontraumatic intracerebral hemorrhage, unspecified: Secondary | ICD-10-CM | POA: Diagnosis not present

## 2023-09-12 DIAGNOSIS — I214 Non-ST elevation (NSTEMI) myocardial infarction: Secondary | ICD-10-CM | POA: Diagnosis not present

## 2023-09-12 DIAGNOSIS — I701 Atherosclerosis of renal artery: Secondary | ICD-10-CM | POA: Diagnosis not present

## 2023-09-12 DIAGNOSIS — C3492 Malignant neoplasm of unspecified part of left bronchus or lung: Secondary | ICD-10-CM | POA: Diagnosis not present

## 2023-09-12 DIAGNOSIS — I69351 Hemiplegia and hemiparesis following cerebral infarction affecting right dominant side: Secondary | ICD-10-CM | POA: Diagnosis not present

## 2023-09-12 DIAGNOSIS — C50412 Malignant neoplasm of upper-outer quadrant of left female breast: Secondary | ICD-10-CM | POA: Diagnosis not present

## 2023-09-12 NOTE — Telephone Encounter (Signed)
 Scheduled Pet scan for pt and will call them to give them the time.

## 2023-09-15 DIAGNOSIS — C3492 Malignant neoplasm of unspecified part of left bronchus or lung: Secondary | ICD-10-CM | POA: Diagnosis not present

## 2023-09-15 DIAGNOSIS — J449 Chronic obstructive pulmonary disease, unspecified: Secondary | ICD-10-CM | POA: Diagnosis not present

## 2023-09-15 DIAGNOSIS — I69351 Hemiplegia and hemiparesis following cerebral infarction affecting right dominant side: Secondary | ICD-10-CM | POA: Diagnosis not present

## 2023-09-15 DIAGNOSIS — I701 Atherosclerosis of renal artery: Secondary | ICD-10-CM | POA: Diagnosis not present

## 2023-09-15 DIAGNOSIS — I5033 Acute on chronic diastolic (congestive) heart failure: Secondary | ICD-10-CM | POA: Diagnosis not present

## 2023-09-15 DIAGNOSIS — C50412 Malignant neoplasm of upper-outer quadrant of left female breast: Secondary | ICD-10-CM | POA: Diagnosis not present

## 2023-09-15 DIAGNOSIS — I214 Non-ST elevation (NSTEMI) myocardial infarction: Secondary | ICD-10-CM | POA: Diagnosis not present

## 2023-09-15 DIAGNOSIS — E43 Unspecified severe protein-calorie malnutrition: Secondary | ICD-10-CM | POA: Diagnosis not present

## 2023-09-15 DIAGNOSIS — C3491 Malignant neoplasm of unspecified part of right bronchus or lung: Secondary | ICD-10-CM | POA: Diagnosis not present

## 2023-09-15 DIAGNOSIS — I6529 Occlusion and stenosis of unspecified carotid artery: Secondary | ICD-10-CM | POA: Diagnosis not present

## 2023-09-15 DIAGNOSIS — I619 Nontraumatic intracerebral hemorrhage, unspecified: Secondary | ICD-10-CM | POA: Diagnosis not present

## 2023-09-15 DIAGNOSIS — J441 Chronic obstructive pulmonary disease with (acute) exacerbation: Secondary | ICD-10-CM | POA: Diagnosis not present

## 2023-09-17 DIAGNOSIS — J449 Chronic obstructive pulmonary disease, unspecified: Secondary | ICD-10-CM | POA: Diagnosis not present

## 2023-09-17 DIAGNOSIS — E43 Unspecified severe protein-calorie malnutrition: Secondary | ICD-10-CM | POA: Diagnosis not present

## 2023-09-17 DIAGNOSIS — I619 Nontraumatic intracerebral hemorrhage, unspecified: Secondary | ICD-10-CM | POA: Diagnosis not present

## 2023-09-17 DIAGNOSIS — C50412 Malignant neoplasm of upper-outer quadrant of left female breast: Secondary | ICD-10-CM | POA: Diagnosis not present

## 2023-09-17 DIAGNOSIS — I6529 Occlusion and stenosis of unspecified carotid artery: Secondary | ICD-10-CM | POA: Diagnosis not present

## 2023-09-17 DIAGNOSIS — J441 Chronic obstructive pulmonary disease with (acute) exacerbation: Secondary | ICD-10-CM | POA: Diagnosis not present

## 2023-09-17 DIAGNOSIS — I5033 Acute on chronic diastolic (congestive) heart failure: Secondary | ICD-10-CM | POA: Diagnosis not present

## 2023-09-17 DIAGNOSIS — I214 Non-ST elevation (NSTEMI) myocardial infarction: Secondary | ICD-10-CM | POA: Diagnosis not present

## 2023-09-17 DIAGNOSIS — C3491 Malignant neoplasm of unspecified part of right bronchus or lung: Secondary | ICD-10-CM | POA: Diagnosis not present

## 2023-09-17 DIAGNOSIS — I69351 Hemiplegia and hemiparesis following cerebral infarction affecting right dominant side: Secondary | ICD-10-CM | POA: Diagnosis not present

## 2023-09-17 DIAGNOSIS — C3492 Malignant neoplasm of unspecified part of left bronchus or lung: Secondary | ICD-10-CM | POA: Diagnosis not present

## 2023-09-17 DIAGNOSIS — I701 Atherosclerosis of renal artery: Secondary | ICD-10-CM | POA: Diagnosis not present

## 2023-09-18 ENCOUNTER — Ambulatory Visit (HOSPITAL_COMMUNITY): Payer: Medicare Other

## 2023-09-18 DIAGNOSIS — I69351 Hemiplegia and hemiparesis following cerebral infarction affecting right dominant side: Secondary | ICD-10-CM | POA: Diagnosis not present

## 2023-09-18 DIAGNOSIS — I214 Non-ST elevation (NSTEMI) myocardial infarction: Secondary | ICD-10-CM | POA: Diagnosis not present

## 2023-09-18 DIAGNOSIS — J441 Chronic obstructive pulmonary disease with (acute) exacerbation: Secondary | ICD-10-CM | POA: Diagnosis not present

## 2023-09-18 DIAGNOSIS — C50412 Malignant neoplasm of upper-outer quadrant of left female breast: Secondary | ICD-10-CM | POA: Diagnosis not present

## 2023-09-18 DIAGNOSIS — E43 Unspecified severe protein-calorie malnutrition: Secondary | ICD-10-CM | POA: Diagnosis not present

## 2023-09-18 DIAGNOSIS — I6529 Occlusion and stenosis of unspecified carotid artery: Secondary | ICD-10-CM | POA: Diagnosis not present

## 2023-09-18 DIAGNOSIS — I5033 Acute on chronic diastolic (congestive) heart failure: Secondary | ICD-10-CM | POA: Diagnosis not present

## 2023-09-18 DIAGNOSIS — C3492 Malignant neoplasm of unspecified part of left bronchus or lung: Secondary | ICD-10-CM | POA: Diagnosis not present

## 2023-09-18 DIAGNOSIS — I619 Nontraumatic intracerebral hemorrhage, unspecified: Secondary | ICD-10-CM | POA: Diagnosis not present

## 2023-09-18 DIAGNOSIS — I701 Atherosclerosis of renal artery: Secondary | ICD-10-CM | POA: Diagnosis not present

## 2023-09-18 DIAGNOSIS — J449 Chronic obstructive pulmonary disease, unspecified: Secondary | ICD-10-CM | POA: Diagnosis not present

## 2023-09-18 DIAGNOSIS — C3491 Malignant neoplasm of unspecified part of right bronchus or lung: Secondary | ICD-10-CM | POA: Diagnosis not present

## 2023-09-19 DIAGNOSIS — I6529 Occlusion and stenosis of unspecified carotid artery: Secondary | ICD-10-CM | POA: Diagnosis not present

## 2023-09-19 DIAGNOSIS — J441 Chronic obstructive pulmonary disease with (acute) exacerbation: Secondary | ICD-10-CM | POA: Diagnosis not present

## 2023-09-19 DIAGNOSIS — I214 Non-ST elevation (NSTEMI) myocardial infarction: Secondary | ICD-10-CM | POA: Diagnosis not present

## 2023-09-19 DIAGNOSIS — C3492 Malignant neoplasm of unspecified part of left bronchus or lung: Secondary | ICD-10-CM | POA: Diagnosis not present

## 2023-09-19 DIAGNOSIS — J449 Chronic obstructive pulmonary disease, unspecified: Secondary | ICD-10-CM | POA: Diagnosis not present

## 2023-09-19 DIAGNOSIS — C3491 Malignant neoplasm of unspecified part of right bronchus or lung: Secondary | ICD-10-CM | POA: Diagnosis not present

## 2023-09-19 DIAGNOSIS — C50412 Malignant neoplasm of upper-outer quadrant of left female breast: Secondary | ICD-10-CM | POA: Diagnosis not present

## 2023-09-19 DIAGNOSIS — E43 Unspecified severe protein-calorie malnutrition: Secondary | ICD-10-CM | POA: Diagnosis not present

## 2023-09-19 DIAGNOSIS — I69351 Hemiplegia and hemiparesis following cerebral infarction affecting right dominant side: Secondary | ICD-10-CM | POA: Diagnosis not present

## 2023-09-19 DIAGNOSIS — I701 Atherosclerosis of renal artery: Secondary | ICD-10-CM | POA: Diagnosis not present

## 2023-09-19 DIAGNOSIS — I619 Nontraumatic intracerebral hemorrhage, unspecified: Secondary | ICD-10-CM | POA: Diagnosis not present

## 2023-09-19 DIAGNOSIS — I5033 Acute on chronic diastolic (congestive) heart failure: Secondary | ICD-10-CM | POA: Diagnosis not present

## 2023-09-22 DIAGNOSIS — J441 Chronic obstructive pulmonary disease with (acute) exacerbation: Secondary | ICD-10-CM | POA: Diagnosis not present

## 2023-09-22 DIAGNOSIS — J449 Chronic obstructive pulmonary disease, unspecified: Secondary | ICD-10-CM | POA: Diagnosis not present

## 2023-09-22 DIAGNOSIS — I69351 Hemiplegia and hemiparesis following cerebral infarction affecting right dominant side: Secondary | ICD-10-CM | POA: Diagnosis not present

## 2023-09-22 DIAGNOSIS — I5033 Acute on chronic diastolic (congestive) heart failure: Secondary | ICD-10-CM | POA: Diagnosis not present

## 2023-09-22 DIAGNOSIS — C50412 Malignant neoplasm of upper-outer quadrant of left female breast: Secondary | ICD-10-CM | POA: Diagnosis not present

## 2023-09-22 DIAGNOSIS — I214 Non-ST elevation (NSTEMI) myocardial infarction: Secondary | ICD-10-CM | POA: Diagnosis not present

## 2023-09-22 DIAGNOSIS — I701 Atherosclerosis of renal artery: Secondary | ICD-10-CM | POA: Diagnosis not present

## 2023-09-22 DIAGNOSIS — C3491 Malignant neoplasm of unspecified part of right bronchus or lung: Secondary | ICD-10-CM | POA: Diagnosis not present

## 2023-09-22 DIAGNOSIS — E43 Unspecified severe protein-calorie malnutrition: Secondary | ICD-10-CM | POA: Diagnosis not present

## 2023-09-22 DIAGNOSIS — C3492 Malignant neoplasm of unspecified part of left bronchus or lung: Secondary | ICD-10-CM | POA: Diagnosis not present

## 2023-09-22 DIAGNOSIS — I619 Nontraumatic intracerebral hemorrhage, unspecified: Secondary | ICD-10-CM | POA: Diagnosis not present

## 2023-09-22 DIAGNOSIS — I6529 Occlusion and stenosis of unspecified carotid artery: Secondary | ICD-10-CM | POA: Diagnosis not present

## 2023-09-24 DIAGNOSIS — I69351 Hemiplegia and hemiparesis following cerebral infarction affecting right dominant side: Secondary | ICD-10-CM | POA: Diagnosis not present

## 2023-09-24 DIAGNOSIS — I5033 Acute on chronic diastolic (congestive) heart failure: Secondary | ICD-10-CM | POA: Diagnosis not present

## 2023-09-24 DIAGNOSIS — C3492 Malignant neoplasm of unspecified part of left bronchus or lung: Secondary | ICD-10-CM | POA: Diagnosis not present

## 2023-09-24 DIAGNOSIS — E43 Unspecified severe protein-calorie malnutrition: Secondary | ICD-10-CM | POA: Diagnosis not present

## 2023-09-24 DIAGNOSIS — J441 Chronic obstructive pulmonary disease with (acute) exacerbation: Secondary | ICD-10-CM | POA: Diagnosis not present

## 2023-09-24 DIAGNOSIS — I214 Non-ST elevation (NSTEMI) myocardial infarction: Secondary | ICD-10-CM | POA: Diagnosis not present

## 2023-09-24 DIAGNOSIS — J449 Chronic obstructive pulmonary disease, unspecified: Secondary | ICD-10-CM | POA: Diagnosis not present

## 2023-09-24 DIAGNOSIS — I6529 Occlusion and stenosis of unspecified carotid artery: Secondary | ICD-10-CM | POA: Diagnosis not present

## 2023-09-24 DIAGNOSIS — C50412 Malignant neoplasm of upper-outer quadrant of left female breast: Secondary | ICD-10-CM | POA: Diagnosis not present

## 2023-09-24 DIAGNOSIS — I619 Nontraumatic intracerebral hemorrhage, unspecified: Secondary | ICD-10-CM | POA: Diagnosis not present

## 2023-09-24 DIAGNOSIS — I701 Atherosclerosis of renal artery: Secondary | ICD-10-CM | POA: Diagnosis not present

## 2023-09-24 DIAGNOSIS — C3491 Malignant neoplasm of unspecified part of right bronchus or lung: Secondary | ICD-10-CM | POA: Diagnosis not present

## 2023-09-26 ENCOUNTER — Ambulatory Visit (HOSPITAL_COMMUNITY)
Admission: RE | Admit: 2023-09-26 | Discharge: 2023-09-26 | Disposition: A | Discharge status: Home / Self Care | Payer: Medicare Other | Source: Ambulatory Visit | Attending: Emergency Medicine | Admitting: Emergency Medicine

## 2023-09-26 ENCOUNTER — Encounter (HOSPITAL_COMMUNITY): Payer: Self-pay

## 2023-09-26 DIAGNOSIS — J449 Chronic obstructive pulmonary disease, unspecified: Secondary | ICD-10-CM | POA: Diagnosis not present

## 2023-09-26 DIAGNOSIS — I701 Atherosclerosis of renal artery: Secondary | ICD-10-CM | POA: Diagnosis not present

## 2023-09-26 DIAGNOSIS — I69351 Hemiplegia and hemiparesis following cerebral infarction affecting right dominant side: Secondary | ICD-10-CM | POA: Diagnosis not present

## 2023-09-26 DIAGNOSIS — R918 Other nonspecific abnormal finding of lung field: Secondary | ICD-10-CM | POA: Insufficient documentation

## 2023-09-26 DIAGNOSIS — C3491 Malignant neoplasm of unspecified part of right bronchus or lung: Secondary | ICD-10-CM | POA: Diagnosis not present

## 2023-09-26 DIAGNOSIS — J441 Chronic obstructive pulmonary disease with (acute) exacerbation: Secondary | ICD-10-CM | POA: Diagnosis not present

## 2023-09-26 DIAGNOSIS — I6529 Occlusion and stenosis of unspecified carotid artery: Secondary | ICD-10-CM | POA: Diagnosis not present

## 2023-09-26 DIAGNOSIS — I5033 Acute on chronic diastolic (congestive) heart failure: Secondary | ICD-10-CM | POA: Diagnosis not present

## 2023-09-26 DIAGNOSIS — I619 Nontraumatic intracerebral hemorrhage, unspecified: Secondary | ICD-10-CM | POA: Diagnosis not present

## 2023-09-26 DIAGNOSIS — C3492 Malignant neoplasm of unspecified part of left bronchus or lung: Secondary | ICD-10-CM | POA: Diagnosis not present

## 2023-09-26 DIAGNOSIS — E43 Unspecified severe protein-calorie malnutrition: Secondary | ICD-10-CM | POA: Diagnosis not present

## 2023-09-26 DIAGNOSIS — C50412 Malignant neoplasm of upper-outer quadrant of left female breast: Secondary | ICD-10-CM | POA: Diagnosis not present

## 2023-09-26 DIAGNOSIS — I214 Non-ST elevation (NSTEMI) myocardial infarction: Secondary | ICD-10-CM | POA: Diagnosis not present

## 2023-09-26 LAB — GLUCOSE, CAPILLARY: Glucose-Capillary: 168 mg/dL — ABNORMAL HIGH (ref 70–99)

## 2023-09-26 MED ORDER — FLUDEOXYGLUCOSE F - 18 (FDG) INJECTION: 5.0000 | Freq: Once | INTRAVENOUS | Status: DC | PRN

## 2023-09-29 DIAGNOSIS — I619 Nontraumatic intracerebral hemorrhage, unspecified: Secondary | ICD-10-CM | POA: Diagnosis not present

## 2023-09-29 DIAGNOSIS — I214 Non-ST elevation (NSTEMI) myocardial infarction: Secondary | ICD-10-CM | POA: Diagnosis not present

## 2023-09-29 DIAGNOSIS — C50412 Malignant neoplasm of upper-outer quadrant of left female breast: Secondary | ICD-10-CM | POA: Diagnosis not present

## 2023-09-29 DIAGNOSIS — I5033 Acute on chronic diastolic (congestive) heart failure: Secondary | ICD-10-CM | POA: Diagnosis not present

## 2023-09-29 DIAGNOSIS — I701 Atherosclerosis of renal artery: Secondary | ICD-10-CM | POA: Diagnosis not present

## 2023-09-29 DIAGNOSIS — I6529 Occlusion and stenosis of unspecified carotid artery: Secondary | ICD-10-CM | POA: Diagnosis not present

## 2023-09-29 DIAGNOSIS — C3492 Malignant neoplasm of unspecified part of left bronchus or lung: Secondary | ICD-10-CM | POA: Diagnosis not present

## 2023-09-29 DIAGNOSIS — E43 Unspecified severe protein-calorie malnutrition: Secondary | ICD-10-CM | POA: Diagnosis not present

## 2023-09-29 DIAGNOSIS — I69351 Hemiplegia and hemiparesis following cerebral infarction affecting right dominant side: Secondary | ICD-10-CM | POA: Diagnosis not present

## 2023-09-29 DIAGNOSIS — J449 Chronic obstructive pulmonary disease, unspecified: Secondary | ICD-10-CM | POA: Diagnosis not present

## 2023-09-29 DIAGNOSIS — J441 Chronic obstructive pulmonary disease with (acute) exacerbation: Secondary | ICD-10-CM | POA: Diagnosis not present

## 2023-09-29 DIAGNOSIS — C3491 Malignant neoplasm of unspecified part of right bronchus or lung: Secondary | ICD-10-CM | POA: Diagnosis not present

## 2023-10-01 ENCOUNTER — Encounter: Payer: Self-pay | Admitting: Emergency Medicine

## 2023-10-01 ENCOUNTER — Ambulatory Visit: Payer: Medicare Other | Admitting: Emergency Medicine

## 2023-10-01 VITALS — BP 137/89 | HR 96 | Ht 60.0 in | Wt 87.0 lb

## 2023-10-01 DIAGNOSIS — R918 Other nonspecific abnormal finding of lung field: Secondary | ICD-10-CM | POA: Diagnosis not present

## 2023-10-01 DIAGNOSIS — I701 Atherosclerosis of renal artery: Secondary | ICD-10-CM | POA: Diagnosis not present

## 2023-10-01 DIAGNOSIS — E43 Unspecified severe protein-calorie malnutrition: Secondary | ICD-10-CM | POA: Diagnosis not present

## 2023-10-01 DIAGNOSIS — I6529 Occlusion and stenosis of unspecified carotid artery: Secondary | ICD-10-CM | POA: Diagnosis not present

## 2023-10-01 DIAGNOSIS — J441 Chronic obstructive pulmonary disease with (acute) exacerbation: Secondary | ICD-10-CM | POA: Diagnosis not present

## 2023-10-01 DIAGNOSIS — Z9981 Dependence on supplemental oxygen: Secondary | ICD-10-CM

## 2023-10-01 DIAGNOSIS — Z85118 Personal history of other malignant neoplasm of bronchus and lung: Secondary | ICD-10-CM

## 2023-10-01 DIAGNOSIS — F17210 Nicotine dependence, cigarettes, uncomplicated: Secondary | ICD-10-CM | POA: Diagnosis not present

## 2023-10-01 DIAGNOSIS — C3491 Malignant neoplasm of unspecified part of right bronchus or lung: Secondary | ICD-10-CM | POA: Diagnosis not present

## 2023-10-01 DIAGNOSIS — I214 Non-ST elevation (NSTEMI) myocardial infarction: Secondary | ICD-10-CM | POA: Diagnosis not present

## 2023-10-01 DIAGNOSIS — C50412 Malignant neoplasm of upper-outer quadrant of left female breast: Secondary | ICD-10-CM | POA: Diagnosis not present

## 2023-10-01 DIAGNOSIS — J449 Chronic obstructive pulmonary disease, unspecified: Secondary | ICD-10-CM

## 2023-10-01 DIAGNOSIS — I5033 Acute on chronic diastolic (congestive) heart failure: Secondary | ICD-10-CM | POA: Diagnosis not present

## 2023-10-01 DIAGNOSIS — C3492 Malignant neoplasm of unspecified part of left bronchus or lung: Secondary | ICD-10-CM | POA: Diagnosis not present

## 2023-10-01 DIAGNOSIS — J9611 Chronic respiratory failure with hypoxia: Secondary | ICD-10-CM | POA: Diagnosis not present

## 2023-10-01 DIAGNOSIS — I69351 Hemiplegia and hemiparesis following cerebral infarction affecting right dominant side: Secondary | ICD-10-CM | POA: Diagnosis not present

## 2023-10-01 DIAGNOSIS — I619 Nontraumatic intracerebral hemorrhage, unspecified: Secondary | ICD-10-CM | POA: Diagnosis not present

## 2023-10-01 NOTE — Assessment & Plan Note (Signed)
 Kendra Watts does not know why she is here for the issues at hand.  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  She is here alone.  Does have a sister who lives in [place].  I tried to call [name] today [phone]) but could not reach her.  I think it is reasonable to get the PET scan just to help guide [place] with regard to suspicion for disease.  I do not think she could tolerate anything other than SBRT and honestly given her dementia and lack of insight into her disease and not sure she could or should.  It may very well be we need to work towards palliative care/hospice care.  I will review the PET scan with her after it is done, is scheduled for 10/06/2023.

## 2023-10-01 NOTE — Progress Notes (Signed)
 Subjective:    Patient ID: Kendra Watts, female    DOB: 27-May-1946, 78 y.o.   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  She has chronic hypoxemic respiratory failure on 3 L/min.  We have followed other pulmonary nodules on CT chest including a 1 cm right lower lobe pulmonary nodule that was stable up until 01/2021.  Her most recent were done 01/15/2023 and then again 07/11/2023 when she was admitted to the hospital for myocardial infarction. She is having poor memory, cannot really tell me the details of her hospitalization. She is on Breztri, uses albuterol nebs but she cannot tell me how often. She has a sister in [place], no one local.   CT chest 01/15/2023 showed 9 x 8 mm superior segmental left lower lobe pulmonary nodule that may have been slightly increased in size, 5 mm right lower lobe pulmonary nodule that was stable, scattered other stable pulmonary nodules.  CT-PA 07/11/2023 was performed 07/11/2023 and reviewed by me, shows no mediastinal or hilar adenopathy, mild to moderate centrilobular emphysema, chronic upper lobe radiation changes and fiducial markers.  1.8 x 1.7 cm area of nodular cavitation at the right apex.  Her left lower lobe superior segmental nodule has increased in size to 1.6 x 1.0 cm compared with June  ROV 10/01/2023 --follow-up visit for 78 year old woman with severe COPD, history of bilateral lung cancers both treated with SBRT, chronic hypoxemic respiratory failure on 3 L/min.  We have been following other pulmonary nodules which have increased in size on her most recent imaging from December.  We had arranged for a PET scan, looks like this is scheduled for 10/06/2023. She is here today, confused as to the reason for the visit. Very clearly poor memory regarding her health care and health issues.  Sister Kendra Watts [phone].  I  called Kendra Watts to discuss Kendra Watts's care and talk about plans.  No answer.  Will have to try her back in the future.   Review of Systems As above      Objective:   Physical Exam Vitals:   10/01/23 1622  BP: 137/89  Pulse: 96  SpO2: 97%  Weight: 87 lb (39.5 kg)  Height: 5' (1.524 m)   Gen: Pleasant, thin, in no distress,  normal affect  ENT: No lesions,  mouth clear, some mild while film posterior tongue  Neck: No JVD, no stridor  Lungs: No use of accessory muscles, distant, no wheeze on a normal breath, soft exp wheeze on forced exp  Cardiovascular: RRR, heart sounds normal, no murmur or gallops, no peripheral edema  Musculoskeletal: No deformities, no cyanosis or clubbing  Neuro: Confused, does not know why she is here, does not know where she lives, has absolutely no insight into her medical condition.  Skin: Warm, no lesions or rash      Assessment & Plan:  Multiple lung nodules Dan does not know why she is here for the issues at hand.  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  She is here alone.  Does have a sister who lives in [place].  I tried to call Kendra Watts today [phone]) but could not reach her.  I think it is reasonable to get the PET scan just to help guide [place] with regard to suspicion for disease.  I do not think she could tolerate anything other than  SBRT and honestly given her dementia and lack of insight into her disease and not sure she could or should.  It may very well be we need to work towards palliative care/hospice care.  I will review the PET scan with her after it is done, is scheduled for 10/06/2023.  Time spent 33 minutes  Levy Pupa, MD, PhD 10/01/2023, 4:51 PM [facility] [phone] or if no answer [phone]

## 2023-10-01 NOTE — Patient Instructions (Addendum)
 Please get your PET scan on 10/06/2023 as planned. Follow-up with Dr. Delton Coombes after that scan to discuss the results.  It would best if you are able to come to that visit with family or caregiver to help with the discussion.

## 2023-10-03 DIAGNOSIS — I214 Non-ST elevation (NSTEMI) myocardial infarction: Secondary | ICD-10-CM | POA: Diagnosis not present

## 2023-10-03 DIAGNOSIS — I5033 Acute on chronic diastolic (congestive) heart failure: Secondary | ICD-10-CM | POA: Diagnosis not present

## 2023-10-03 DIAGNOSIS — I69351 Hemiplegia and hemiparesis following cerebral infarction affecting right dominant side: Secondary | ICD-10-CM | POA: Diagnosis not present

## 2023-10-03 DIAGNOSIS — J449 Chronic obstructive pulmonary disease, unspecified: Secondary | ICD-10-CM | POA: Diagnosis not present

## 2023-10-03 DIAGNOSIS — I701 Atherosclerosis of renal artery: Secondary | ICD-10-CM | POA: Diagnosis not present

## 2023-10-03 DIAGNOSIS — I619 Nontraumatic intracerebral hemorrhage, unspecified: Secondary | ICD-10-CM | POA: Diagnosis not present

## 2023-10-03 DIAGNOSIS — C50412 Malignant neoplasm of upper-outer quadrant of left female breast: Secondary | ICD-10-CM | POA: Diagnosis not present

## 2023-10-03 DIAGNOSIS — C3492 Malignant neoplasm of unspecified part of left bronchus or lung: Secondary | ICD-10-CM | POA: Diagnosis not present

## 2023-10-03 DIAGNOSIS — J441 Chronic obstructive pulmonary disease with (acute) exacerbation: Secondary | ICD-10-CM | POA: Diagnosis not present

## 2023-10-03 DIAGNOSIS — E43 Unspecified severe protein-calorie malnutrition: Secondary | ICD-10-CM | POA: Diagnosis not present

## 2023-10-03 DIAGNOSIS — C3491 Malignant neoplasm of unspecified part of right bronchus or lung: Secondary | ICD-10-CM | POA: Diagnosis not present

## 2023-10-03 DIAGNOSIS — I6529 Occlusion and stenosis of unspecified carotid artery: Secondary | ICD-10-CM | POA: Diagnosis not present

## 2023-10-04 DIAGNOSIS — E43 Unspecified severe protein-calorie malnutrition: Secondary | ICD-10-CM | POA: Diagnosis not present

## 2023-10-04 DIAGNOSIS — I619 Nontraumatic intracerebral hemorrhage, unspecified: Secondary | ICD-10-CM | POA: Diagnosis not present

## 2023-10-04 DIAGNOSIS — I214 Non-ST elevation (NSTEMI) myocardial infarction: Secondary | ICD-10-CM | POA: Diagnosis not present

## 2023-10-04 DIAGNOSIS — I5033 Acute on chronic diastolic (congestive) heart failure: Secondary | ICD-10-CM | POA: Diagnosis not present

## 2023-10-04 DIAGNOSIS — I6529 Occlusion and stenosis of unspecified carotid artery: Secondary | ICD-10-CM | POA: Diagnosis not present

## 2023-10-04 DIAGNOSIS — J449 Chronic obstructive pulmonary disease, unspecified: Secondary | ICD-10-CM | POA: Diagnosis not present

## 2023-10-04 DIAGNOSIS — J441 Chronic obstructive pulmonary disease with (acute) exacerbation: Secondary | ICD-10-CM | POA: Diagnosis not present

## 2023-10-04 DIAGNOSIS — C3492 Malignant neoplasm of unspecified part of left bronchus or lung: Secondary | ICD-10-CM | POA: Diagnosis not present

## 2023-10-04 DIAGNOSIS — I69351 Hemiplegia and hemiparesis following cerebral infarction affecting right dominant side: Secondary | ICD-10-CM | POA: Diagnosis not present

## 2023-10-04 DIAGNOSIS — C50412 Malignant neoplasm of upper-outer quadrant of left female breast: Secondary | ICD-10-CM | POA: Diagnosis not present

## 2023-10-04 DIAGNOSIS — C3491 Malignant neoplasm of unspecified part of right bronchus or lung: Secondary | ICD-10-CM | POA: Diagnosis not present

## 2023-10-04 DIAGNOSIS — I701 Atherosclerosis of renal artery: Secondary | ICD-10-CM | POA: Diagnosis not present

## 2023-10-06 ENCOUNTER — Ambulatory Visit (HOSPITAL_COMMUNITY)
Admission: RE | Admit: 2023-10-06 | Discharge: 2023-10-06 | Disposition: A | Discharge status: Home / Self Care | Source: Ambulatory Visit | Attending: Emergency Medicine | Admitting: Emergency Medicine

## 2023-10-06 DIAGNOSIS — E43 Unspecified severe protein-calorie malnutrition: Secondary | ICD-10-CM | POA: Diagnosis not present

## 2023-10-06 DIAGNOSIS — I214 Non-ST elevation (NSTEMI) myocardial infarction: Secondary | ICD-10-CM | POA: Diagnosis not present

## 2023-10-06 DIAGNOSIS — I6529 Occlusion and stenosis of unspecified carotid artery: Secondary | ICD-10-CM | POA: Diagnosis not present

## 2023-10-06 DIAGNOSIS — C3491 Malignant neoplasm of unspecified part of right bronchus or lung: Secondary | ICD-10-CM | POA: Diagnosis not present

## 2023-10-06 DIAGNOSIS — I701 Atherosclerosis of renal artery: Secondary | ICD-10-CM | POA: Diagnosis not present

## 2023-10-06 DIAGNOSIS — C50412 Malignant neoplasm of upper-outer quadrant of left female breast: Secondary | ICD-10-CM | POA: Diagnosis not present

## 2023-10-06 DIAGNOSIS — J449 Chronic obstructive pulmonary disease, unspecified: Secondary | ICD-10-CM | POA: Diagnosis not present

## 2023-10-06 DIAGNOSIS — I619 Nontraumatic intracerebral hemorrhage, unspecified: Secondary | ICD-10-CM | POA: Diagnosis not present

## 2023-10-06 DIAGNOSIS — I5033 Acute on chronic diastolic (congestive) heart failure: Secondary | ICD-10-CM | POA: Diagnosis not present

## 2023-10-06 DIAGNOSIS — I69351 Hemiplegia and hemiparesis following cerebral infarction affecting right dominant side: Secondary | ICD-10-CM | POA: Diagnosis not present

## 2023-10-06 DIAGNOSIS — J441 Chronic obstructive pulmonary disease with (acute) exacerbation: Secondary | ICD-10-CM | POA: Diagnosis not present

## 2023-10-06 DIAGNOSIS — C3492 Malignant neoplasm of unspecified part of left bronchus or lung: Secondary | ICD-10-CM | POA: Diagnosis not present

## 2023-10-07 DIAGNOSIS — F064 Anxiety disorder due to known physiological condition: Secondary | ICD-10-CM | POA: Diagnosis not present

## 2023-10-07 DIAGNOSIS — F01B2 Vascular dementia, moderate, with psychotic disturbance: Secondary | ICD-10-CM | POA: Diagnosis not present

## 2023-10-07 DIAGNOSIS — F445 Conversion disorder with seizures or convulsions: Secondary | ICD-10-CM | POA: Diagnosis not present

## 2023-10-07 DIAGNOSIS — F331 Major depressive disorder, recurrent, moderate: Secondary | ICD-10-CM | POA: Diagnosis not present

## 2023-10-08 DIAGNOSIS — E43 Unspecified severe protein-calorie malnutrition: Secondary | ICD-10-CM | POA: Diagnosis not present

## 2023-10-08 DIAGNOSIS — I214 Non-ST elevation (NSTEMI) myocardial infarction: Secondary | ICD-10-CM | POA: Diagnosis not present

## 2023-10-08 DIAGNOSIS — I701 Atherosclerosis of renal artery: Secondary | ICD-10-CM | POA: Diagnosis not present

## 2023-10-08 DIAGNOSIS — I5033 Acute on chronic diastolic (congestive) heart failure: Secondary | ICD-10-CM | POA: Diagnosis not present

## 2023-10-08 DIAGNOSIS — I619 Nontraumatic intracerebral hemorrhage, unspecified: Secondary | ICD-10-CM | POA: Diagnosis not present

## 2023-10-08 DIAGNOSIS — C3492 Malignant neoplasm of unspecified part of left bronchus or lung: Secondary | ICD-10-CM | POA: Diagnosis not present

## 2023-10-08 DIAGNOSIS — C50412 Malignant neoplasm of upper-outer quadrant of left female breast: Secondary | ICD-10-CM | POA: Diagnosis not present

## 2023-10-08 DIAGNOSIS — C3491 Malignant neoplasm of unspecified part of right bronchus or lung: Secondary | ICD-10-CM | POA: Diagnosis not present

## 2023-10-08 DIAGNOSIS — I69351 Hemiplegia and hemiparesis following cerebral infarction affecting right dominant side: Secondary | ICD-10-CM | POA: Diagnosis not present

## 2023-10-08 DIAGNOSIS — J441 Chronic obstructive pulmonary disease with (acute) exacerbation: Secondary | ICD-10-CM | POA: Diagnosis not present

## 2023-10-08 DIAGNOSIS — J449 Chronic obstructive pulmonary disease, unspecified: Secondary | ICD-10-CM | POA: Diagnosis not present

## 2023-10-08 DIAGNOSIS — I6529 Occlusion and stenosis of unspecified carotid artery: Secondary | ICD-10-CM | POA: Diagnosis not present

## 2023-10-10 ENCOUNTER — Emergency Department (HOSPITAL_COMMUNITY)

## 2023-10-10 ENCOUNTER — Other Ambulatory Visit: Payer: Self-pay

## 2023-10-10 ENCOUNTER — Emergency Department (HOSPITAL_COMMUNITY)
Admission: EM | Admit: 2023-10-10 | Discharge: 2023-10-11 | Disposition: A | Discharge status: Home / Self Care | Attending: Emergency Medicine | Admitting: Emergency Medicine

## 2023-10-10 DIAGNOSIS — I6529 Occlusion and stenosis of unspecified carotid artery: Secondary | ICD-10-CM | POA: Diagnosis not present

## 2023-10-10 DIAGNOSIS — F0390 Unspecified dementia without behavioral disturbance: Secondary | ICD-10-CM | POA: Diagnosis not present

## 2023-10-10 DIAGNOSIS — J441 Chronic obstructive pulmonary disease with (acute) exacerbation: Secondary | ICD-10-CM | POA: Diagnosis not present

## 2023-10-10 DIAGNOSIS — C50412 Malignant neoplasm of upper-outer quadrant of left female breast: Secondary | ICD-10-CM | POA: Diagnosis not present

## 2023-10-10 DIAGNOSIS — I2510 Atherosclerotic heart disease of native coronary artery without angina pectoris: Secondary | ICD-10-CM | POA: Insufficient documentation

## 2023-10-10 DIAGNOSIS — I6782 Cerebral ischemia: Secondary | ICD-10-CM | POA: Diagnosis not present

## 2023-10-10 DIAGNOSIS — I959 Hypotension, unspecified: Secondary | ICD-10-CM | POA: Diagnosis not present

## 2023-10-10 DIAGNOSIS — I10 Essential (primary) hypertension: Secondary | ICD-10-CM | POA: Diagnosis not present

## 2023-10-10 DIAGNOSIS — I110 Hypertensive heart disease with heart failure: Secondary | ICD-10-CM | POA: Insufficient documentation

## 2023-10-10 DIAGNOSIS — S0990XA Unspecified injury of head, initial encounter: Secondary | ICD-10-CM | POA: Diagnosis not present

## 2023-10-10 DIAGNOSIS — I5033 Acute on chronic diastolic (congestive) heart failure: Secondary | ICD-10-CM | POA: Diagnosis not present

## 2023-10-10 DIAGNOSIS — M25551 Pain in right hip: Secondary | ICD-10-CM | POA: Insufficient documentation

## 2023-10-10 DIAGNOSIS — N3000 Acute cystitis without hematuria: Secondary | ICD-10-CM

## 2023-10-10 DIAGNOSIS — Z79899 Other long term (current) drug therapy: Secondary | ICD-10-CM | POA: Diagnosis not present

## 2023-10-10 DIAGNOSIS — E871 Hypo-osmolality and hyponatremia: Secondary | ICD-10-CM | POA: Diagnosis not present

## 2023-10-10 DIAGNOSIS — Z7982 Long term (current) use of aspirin: Secondary | ICD-10-CM | POA: Insufficient documentation

## 2023-10-10 DIAGNOSIS — D72829 Elevated white blood cell count, unspecified: Secondary | ICD-10-CM | POA: Insufficient documentation

## 2023-10-10 DIAGNOSIS — I700 Atherosclerosis of aorta: Secondary | ICD-10-CM | POA: Diagnosis not present

## 2023-10-10 DIAGNOSIS — E876 Hypokalemia: Secondary | ICD-10-CM | POA: Insufficient documentation

## 2023-10-10 DIAGNOSIS — S199XXA Unspecified injury of neck, initial encounter: Secondary | ICD-10-CM | POA: Diagnosis not present

## 2023-10-10 DIAGNOSIS — I69351 Hemiplegia and hemiparesis following cerebral infarction affecting right dominant side: Secondary | ICD-10-CM | POA: Diagnosis not present

## 2023-10-10 DIAGNOSIS — C3491 Malignant neoplasm of unspecified part of right bronchus or lung: Secondary | ICD-10-CM | POA: Diagnosis not present

## 2023-10-10 DIAGNOSIS — I509 Heart failure, unspecified: Secondary | ICD-10-CM | POA: Diagnosis not present

## 2023-10-10 DIAGNOSIS — I214 Non-ST elevation (NSTEMI) myocardial infarction: Secondary | ICD-10-CM | POA: Diagnosis not present

## 2023-10-10 DIAGNOSIS — E039 Hypothyroidism, unspecified: Secondary | ICD-10-CM | POA: Insufficient documentation

## 2023-10-10 DIAGNOSIS — R079 Chest pain, unspecified: Secondary | ICD-10-CM | POA: Diagnosis not present

## 2023-10-10 DIAGNOSIS — Z79890 Hormone replacement therapy (postmenopausal): Secondary | ICD-10-CM | POA: Diagnosis not present

## 2023-10-10 DIAGNOSIS — S0081XA Abrasion of other part of head, initial encounter: Secondary | ICD-10-CM | POA: Insufficient documentation

## 2023-10-10 DIAGNOSIS — E43 Unspecified severe protein-calorie malnutrition: Secondary | ICD-10-CM | POA: Diagnosis not present

## 2023-10-10 DIAGNOSIS — W19XXXA Unspecified fall, initial encounter: Secondary | ICD-10-CM | POA: Diagnosis not present

## 2023-10-10 DIAGNOSIS — J449 Chronic obstructive pulmonary disease, unspecified: Secondary | ICD-10-CM | POA: Insufficient documentation

## 2023-10-10 DIAGNOSIS — I701 Atherosclerosis of renal artery: Secondary | ICD-10-CM | POA: Diagnosis not present

## 2023-10-10 DIAGNOSIS — C3492 Malignant neoplasm of unspecified part of left bronchus or lung: Secondary | ICD-10-CM | POA: Diagnosis not present

## 2023-10-10 DIAGNOSIS — Z043 Encounter for examination and observation following other accident: Secondary | ICD-10-CM | POA: Diagnosis not present

## 2023-10-10 DIAGNOSIS — G319 Degenerative disease of nervous system, unspecified: Secondary | ICD-10-CM | POA: Diagnosis not present

## 2023-10-10 DIAGNOSIS — I619 Nontraumatic intracerebral hemorrhage, unspecified: Secondary | ICD-10-CM | POA: Diagnosis not present

## 2023-10-10 LAB — BASIC METABOLIC PANEL
Anion gap: 11 (ref 5–15)
BUN: 28 mg/dL — ABNORMAL HIGH (ref 8–23)
CO2: 30 mmol/L (ref 22–32)
Calcium: 8.4 mg/dL — ABNORMAL LOW (ref 8.9–10.3)
Chloride: 100 mmol/L (ref 98–111)
Creatinine, Ser: 1.05 mg/dL — ABNORMAL HIGH (ref 0.44–1.00)
GFR, Estimated: 55 mL/min — ABNORMAL LOW (ref >60)
Glucose, Bld: 104 mg/dL — ABNORMAL HIGH (ref 70–99)
Potassium: 3.3 mmol/L — ABNORMAL LOW (ref 3.5–5.1)
Sodium: 141 mmol/L (ref 135–145)

## 2023-10-10 LAB — CBC WITH DIFFERENTIAL/PLATELET
Abs Immature Granulocytes: 0.14 10*3/uL — ABNORMAL HIGH (ref 0.00–0.07)
Basophils Absolute: 0 10*3/uL (ref 0.0–0.1)
Basophils Relative: 0 %
Eosinophils Absolute: 0 10*3/uL (ref 0.0–0.5)
Eosinophils Relative: 0 %
HCT: 31.4 % — ABNORMAL LOW (ref 36.0–46.0)
Hemoglobin: 9.4 g/dL — ABNORMAL LOW (ref 12.0–15.0)
Immature Granulocytes: 1 %
Lymphocytes Relative: 6 %
Lymphs Abs: 0.9 10*3/uL (ref 0.7–4.0)
MCH: 25.7 pg — ABNORMAL LOW (ref 26.0–34.0)
MCHC: 29.9 g/dL — ABNORMAL LOW (ref 30.0–36.0)
MCV: 85.8 fL (ref 80.0–100.0)
Monocytes Absolute: 0.8 10*3/uL (ref 0.1–1.0)
Monocytes Relative: 5 %
Neutro Abs: 13.6 10*3/uL — ABNORMAL HIGH (ref 1.7–7.7)
Neutrophils Relative %: 88 %
Platelets: 388 10*3/uL (ref 150–400)
RBC: 3.66 MIL/uL — ABNORMAL LOW (ref 3.87–5.11)
RDW: 18.4 % — ABNORMAL HIGH (ref 11.5–15.5)
WBC: 15.5 10*3/uL — ABNORMAL HIGH (ref 4.0–10.5)
nRBC: 0 % (ref 0.0–0.2)

## 2023-10-10 LAB — URINALYSIS, W/ REFLEX TO CULTURE (INFECTION SUSPECTED)
Bilirubin Urine: NEGATIVE
Glucose, UA: NEGATIVE mg/dL
Hgb urine dipstick: NEGATIVE
Ketones, ur: NEGATIVE mg/dL
Nitrite: NEGATIVE
Protein, ur: 30 mg/dL — AB
Specific Gravity, Urine: 1.011 (ref 1.005–1.030)
pH: 6 (ref 5.0–8.0)

## 2023-10-10 MED ORDER — CEPHALEXIN 500 MG PO CAPS
500.0000 mg | ORAL_CAPSULE | Freq: Once | ORAL | Status: AC
  Administered 2023-10-10: 500 mg via ORAL
  Filled 2023-10-10: qty 1

## 2023-10-10 MED ORDER — POTASSIUM CHLORIDE CRYS ER 20 MEQ PO TBCR
40.0000 meq | EXTENDED_RELEASE_TABLET | Freq: Once | ORAL | Status: AC
  Administered 2023-10-10: 40 meq via ORAL
  Filled 2023-10-10: qty 2

## 2023-10-10 MED ORDER — CEPHALEXIN 500 MG PO CAPS: 500.0000 mg | ORAL_CAPSULE | Freq: Four times a day (QID) | ORAL | 0 refills | Status: AC

## 2023-10-10 NOTE — Discharge Instructions (Signed)
 You were seen in the emergency department after your fall.  You had no signs of injury or broken bones on your CT or imaging.  It does appear that you have a early urinary tract infection and were mildly dehydrated that might be what caused you to fall.  Given you a course of antibiotics he should complete this as prescribed.  You should follow-up with your primary doctor in the next few days to have your symptoms rechecked.  You should return to the emergency department if you have recurrent falls and injure yourself, you have fevers despite the antibiotics or any other new or concerning symptoms.

## 2023-10-10 NOTE — ED Provider Notes (Signed)
 [place] [facility]ERGENCY DEPARTMENT AT [facility] Provider Note   CSN: [id number] Arrival date & time: 10/10/23  2033     History  Chief Complaint  Patient presents with   Marletta Lor    Kendra Watts is a 78 y.o. female.  Patient is a 78 year old female with a past medical history of dementia, CHF, hypothyroidism, chronic hypoxia on home O2, hypertension, COPD presenting to the emergency department after a fall.  Per EMS, the patient had an unwitnessed fall at her nursing home today.  She states that she does not remember the fall today and is unsure what made her fall.  She denies having any pain at this time, nausea or vomiting.  The history is provided by the patient. The history is limited by the condition of the patient (Level 5 caveat for dementia).  Fall       Home Medications Prior to Admission medications   Medication Sig Start Date End Date Taking? Authorizing Provider  cephALEXin (KEFLEX) 500 MG capsule Take 1 capsule (500 mg total) by mouth 4 (four) times daily. 10/10/23  Yes Elayne Snare K, DO  aspirin 81 MG tablet Take 1 tablet (81 mg total) by mouth daily. 03/21/20   Hongalgi, Maximino Greenland, MD  azithromycin (ZITHROMAX) 250 MG tablet 1 tablet daily for next 4 days 08/22/23   Arnetha Courser, MD  Budeson-Glycopyrrol-Formoterol (BREZTRI AEROSPHERE) 160-9-4.8 MCG/ACT AERO Inhale 2 puffs into the lungs in the morning and at bedtime. 06/12/21   Leslye Peer, MD  docusate sodium (COLACE) 100 MG capsule Take 1 capsule (100 mg total) by mouth 2 (two) times daily as needed for mild constipation. 08/18/23   Pokhrel, Rebekah Chesterfield, MD  feeding supplement (ENSURE ENLIVE / ENSURE PLUS) LIQD Take 237 mLs by mouth 2 (two) times daily between meals. 08/18/23   Pokhrel, Rebekah Chesterfield, MD  furosemide (LASIX) 20 MG tablet Take 20 mg by mouth daily.    [provider]  ipratropium-albuterol (DUONEB) 0.5-2.5 (3) MG/3ML SOLN Take 3 mLs by nebulization every 4 (four) hours as needed (shortness  of breath wheezing). 08/18/23   Pokhrel, Rebekah Chesterfield, MD  levETIRAcetam (KEPPRA) 500 MG tablet Take 1 tablet (500 mg total) by mouth 2 (two) times daily. 05/20/23   Lomax, Amy, NP  levothyroxine (SYNTHROID) 88 MCG tablet Take 88 mcg by mouth daily before breakfast.    [provider]  LORazepam (ATIVAN) 0.5 MG tablet Take 0.5 mg by mouth every 6 (six) hours as needed for anxiety.    [provider]  metoprolol succinate (TOPROL-XL) 50 MG 24 hr tablet Take 1 tablet (50 mg total) by mouth daily. Take with or immediately following a meal. 07/18/23   Pokhrel, Laxman, MD  Multiple Vitamin (MULTIVITAMIN WITH MINERALS) TABS tablet Take 1 tablet by mouth daily. 08/19/23   Pokhrel, Rebekah Chesterfield, MD  OXYGEN Inhale 2-4 L into the lungs as needed. As needed to maintain SATS > 90%    [provider]  pantoprazole (PROTONIX) 40 MG tablet Take 1 tablet (40 mg total) by mouth daily. 07/14/18   Darlin Drop, DO  rosuvastatin (CRESTOR) 10 MG tablet Take 10 mg by mouth daily.    [provider]  topiramate (TOPAMAX) 50 MG tablet Take 1 tablet (50 mg total) by mouth 2 (two) times daily. 08/18/23   Pokhrel, Rebekah Chesterfield, MD      Allergies    Patient has no known allergies.    Review of Systems   Review of Systems  Physical Exam Updated Vital  Signs BP (!) 156/70   Pulse 88   Temp 97.8 F (36.6 C) (Oral)   Resp 17   SpO2 95%  Physical Exam Vitals and nursing note reviewed.  Constitutional:      Appearance: Normal appearance. She is not ill-appearing.     Comments: Frail appearing  HENT:     Head: Normocephalic.     Comments: Abrasion to mid-forehead    Nose: Nose normal.     Mouth/Throat:     Mouth: Mucous membranes are moist.     Pharynx: Oropharynx is clear.  Eyes:     Extraocular Movements: Extraocular movements intact.     Conjunctiva/sclera: Conjunctivae normal.     Pupils: Pupils are equal, round, and reactive to light.  Neck:     Comments: No midline neck  tenderness Cardiovascular:     Rate and Rhythm: Normal rate and regular rhythm.     Heart sounds: Normal heart sounds.  Pulmonary:     Effort: Pulmonary effort is normal.     Breath sounds: Normal breath sounds.  Abdominal:     General: Abdomen is flat.     Palpations: Abdomen is soft.     Tenderness: There is no abdominal tenderness.  Musculoskeletal:     Cervical back: Normal range of motion and neck supple.     Comments: No midline back tenderness No bony tenderness to bilateral UE Pain with ROM in R hip, no point tenderness No tenderness to LLE  Skin:    General: Skin is warm and dry.  Neurological:     Mental Status: She is alert.     Sensory: No sensory deficit.     Motor: No weakness.     Comments: Oriented to person and place  Psychiatric:        Mood and Affect: Mood normal.        Behavior: Behavior normal.     ED Results / Procedures / Treatments   Labs (all labs ordered are listed, but only abnormal results are displayed) Labs Reviewed  BASIC METABOLIC PANEL - Abnormal; Notable for the following components:      Result Value   Potassium 3.3 (*)    Glucose, Bld 104 (*)    BUN 28 (*)    Creatinine, Ser 1.05 (*)    Calcium 8.4 (*)    GFR, Estimated 55 (*)    All other components within normal limits  CBC WITH DIFFERENTIAL/PLATELET - Abnormal; Notable for the following components:   WBC 15.5 (*)    RBC 3.66 (*)    Hemoglobin 9.4 (*)    HCT 31.4 (*)    MCH 25.7 (*)    MCHC 29.9 (*)    RDW 18.4 (*)    Neutro Abs 13.6 (*)    Abs Immature Granulocytes 0.14 (*)    All other components within normal limits  URINALYSIS, W/ REFLEX TO CULTURE (INFECTION SUSPECTED) - Abnormal; Notable for the following components:   APPearance HAZY (*)    Protein, ur 30 (*)    Leukocytes,Ua SMALL (*)    Bacteria, UA RARE (*)    All other components within normal limits  URINE CULTURE    EKG None  Radiology CT Head Wo Contrast Result Date: 10/10/2023 CLINICAL DATA:   Trauma fall laceration EXAM: CT HEAD WITHOUT CONTRAST CT CERVICAL SPINE WITHOUT CONTRAST TECHNIQUE: Multidetector CT imaging of the head and cervical spine was performed following the standard protocol without intravenous contrast. Multiplanar CT image reconstructions of the cervical  spine were also generated. RADIATION DOSE REDUCTION: This exam was performed according to the departmental dose-optimization program which includes automated exposure control, adjustment of the mA and/or kV according to patient size and/or use of iterative reconstruction technique. COMPARISON:  CT brain 08/21/2023, CT brain and cervical spine 11/12/2021 FINDINGS: CT HEAD FINDINGS Brain: No acute territorial infarction, hemorrhage or intracranial mass. Atrophy and chronic small vessel ischemic changes of the white matter. Stable ventricle size. Vascular: No hyperdense vessels.  Carotid vascular calcification Skull: Normal. Negative for fracture or focal lesion. Sinuses/Orbits: No acute finding. Other: None CT CERVICAL SPINE FINDINGS Alignment: Exaggeration of cervical lordosis. No subluxation. Facet alignment is within normal limits. Skull base and vertebrae: Mild superior endplate deformity at T1, new compared to 2023 comparison. Chronic compression fracture at T3. Soft tissues and spinal canal: No prevertebral fluid or swelling. No visible canal hematoma. Disc levels: Moderate disc space narrowing C4-C5. Facet degenerative changes at multiple levels. Upper chest: Incompletely assessed masslike density with fiducial marker at the right apex and bandlike density in the left upper lung. Incompletely visualized nodule in the superior left lower lobe and subpleural distortion in the anterior left upper lobe. Other: None IMPRESSION: 1. No CT evidence for acute intracranial abnormality. Atrophy and chronic small vessel ischemic changes of the white matter. 2. Exaggeration of cervical lordosis with degenerative changes. No acute osseous  abnormality of the cervical spine. 3. Mild superior endplate deformity at T1, new compared to 2023 comparison, age indeterminate. Chronic compression fracture T3. 4. Incompletely assessed masslike density with fiducial marker at the right apex and bandlike density in the left upper lung. Incompletely visualized nodule in the superior left lower lobe and subpleural distortion in the anterior left upper lobe. Reference chest CT performed 07/11/2023. Electronically Signed   By: Jasmine Pang M.D.   On: 10/10/2023 22:33   CT Cervical Spine Wo Contrast Result Date: 10/10/2023 CLINICAL DATA:  Trauma fall laceration EXAM: CT HEAD WITHOUT CONTRAST CT CERVICAL SPINE WITHOUT CONTRAST TECHNIQUE: Multidetector CT imaging of the head and cervical spine was performed following the standard protocol without intravenous contrast. Multiplanar CT image reconstructions of the cervical spine were also generated. RADIATION DOSE REDUCTION: This exam was performed according to the departmental dose-optimization program which includes automated exposure control, adjustment of the mA and/or kV according to patient size and/or use of iterative reconstruction technique. COMPARISON:  CT brain 08/21/2023, CT brain and cervical spine 11/12/2021 FINDINGS: CT HEAD FINDINGS Brain: No acute territorial infarction, hemorrhage or intracranial mass. Atrophy and chronic small vessel ischemic changes of the white matter. Stable ventricle size. Vascular: No hyperdense vessels.  Carotid vascular calcification Skull: Normal. Negative for fracture or focal lesion. Sinuses/Orbits: No acute finding. Other: None CT CERVICAL SPINE FINDINGS Alignment: Exaggeration of cervical lordosis. No subluxation. Facet alignment is within normal limits. Skull base and vertebrae: Mild superior endplate deformity at T1, new compared to 2023 comparison. Chronic compression fracture at T3. Soft tissues and spinal canal: No prevertebral fluid or swelling. No visible canal  hematoma. Disc levels: Moderate disc space narrowing C4-C5. Facet degenerative changes at multiple levels. Upper chest: Incompletely assessed masslike density with fiducial marker at the right apex and bandlike density in the left upper lung. Incompletely visualized nodule in the superior left lower lobe and subpleural distortion in the anterior left upper lobe. Other: None IMPRESSION: 1. No CT evidence for acute intracranial abnormality. Atrophy and chronic small vessel ischemic changes of the white matter. 2. Exaggeration of cervical lordosis with degenerative changes. No  acute osseous abnormality of the cervical spine. 3. Mild superior endplate deformity at T1, new compared to 2023 comparison, age indeterminate. Chronic compression fracture T3. 4. Incompletely assessed masslike density with fiducial marker at the right apex and bandlike density in the left upper lung. Incompletely visualized nodule in the superior left lower lobe and subpleural distortion in the anterior left upper lobe. Reference chest CT performed 07/11/2023. Electronically Signed   By: Jasmine Pang M.D.   On: 10/10/2023 22:33   DG Chest Port 1 View Result Date: 10/10/2023 CLINICAL DATA:  Recent fall with chest pain, initial encounter EXAM: PORTABLE CHEST 1 VIEW COMPARISON:  None Available. FINDINGS: Cardiac shadow is within normal limits. Lungs are well aerated bilaterally. Persistent scarring and fiducial markers are noted in the apices bilaterally. Postsurgical changes in the left breast are seen. Aortic calcifications are noted. No acute abnormality seen. IMPRESSION: Stable scarring in the apices bilaterally. No acute abnormality noted. Electronically Signed   By: Alcide Clever M.D.   On: 10/10/2023 22:07   DG Hip Unilat With Pelvis 2-3 Views Right Result Date: 10/10/2023 CLINICAL DATA:  fall EXAM: DG HIP (WITH OR WITHOUT PELVIS) 2-3V RIGHT COMPARISON:  CT abdomen pelvis 10/16/2021 FINDINGS: There is no evidence of acute displaced hip  fracture or dislocation. Cortical irregularity of the right greater trochanter of unclear etiology. No acute fracture or diastasis of the bones of the pelvis. No acute displaced fracture or dislocation of the left hip on frontal view. There is no evidence of severe arthropathy or other focal bone abnormality. Vascular calcification. IMPRESSION: 1.  No acute displaced fracture or dislocation. 2. Cortical irregularity of the right greater trochanter of unclear etiology. Query nondisplaced age-indeterminate fracture. Correlate with point tenderness to palpation for an acute component. Electronically Signed   By: Tish Frederickson M.D.   On: 10/10/2023 21:57    Procedures Procedures    Medications Ordered in ED Medications  cephALEXin (KEFLEX) capsule 500 mg (has no administration in time range)  potassium chloride SA (KLOR-CON M) CR tablet 40 mEq (40 mEq Oral Given 10/10/23 2231)    ED Course/ Medical Decision Making/ A&P Clinical Course as of 10/10/23 2325  Fri Oct 10, 2023  2203 Leukocytosis, slight increased hgb from baseline and platelets up and may be component of hemoconcentration. Cr is slightly increased from baseline, mild hypokalemia will be repleted. ? R greater trochanter fx.  [VK]  2254 Age indeterminant T1 fracture, no point tenderness on the back making acute fracture unlikely. No acute injury on CTH or CXR. UA is pending. [VK]  2318 Rare bacteria with small leuks, patient unable to tell me if she is having urinary symptoms so will plan for treatment pending culture. She has no bony tenderness to her greater trochanter making an acute fracture unlikely. She is stable for discharge back to her facility. [VK]    Clinical Course User Index [VK] Rexford Maus, DO                                 Medical Decision Making This patient presents to the ED with chief complaint(s) of fall with pertinent past medical history of dementia, CHF, COPD, HTN, CAD, hypothyroidism which further  complicates the presenting complaint. The complaint involves an extensive differential diagnosis and also carries with it a high risk of complications and morbidity.    The differential diagnosis includes due to patient's age with evidence of head trauma  concern for ICH, mass effect, cervical spine fracture, patient complaining of some hip pain on exam concerning for possible fracture dislocation, no other traumatic injury seen on exam, patient is unsure of what caused her to fall concern for possible syncopal fall, possible arrhythmia, anemia, dehydration, electrolyte abnormality, infection  Additional history obtained: Additional history obtained from EMS  Records reviewed Nursing Home Documents  ED Course and Reassessment: On patient's arrival she is hemodynamically stable in no acute distress.  Due to patient's age and fall, will have head CT and C-spine as well as right hip x-ray performed.  With unclear etiology of the fall will have EKG and labs performed to evaluate for possible syncopal fall and she will be closely reassessed.  She does have an abrasion to her forehead that will need to be irrigated but does not require laceration repair today.  Independent labs interpretation:  The following labs were independently interpreted: bacturia and leuks concerning for UTI, leukocytosis and mild hyponatremia  Independent visualization of imaging: - I independently visualized the following imaging with scope of interpretation limited to determining acute life threatening conditions related to emergency care: CTH/C-spine, CXR, R hip XR, which revealed no acute traumatic injury  Consultation: - Consulted or discussed management/test interpretation w/ external professional: N/A  Consideration for admission or further workup: Patient has no emergent conditions requiring admission or further work-up at this time and is stable for discharge home with primary care follow-up  Social Determinants of  health: N/A    Amount and/or Complexity of Data Reviewed Labs: ordered. Radiology: ordered.  Risk Prescription drug management.          Final Clinical Impression(s) / ED Diagnoses Final diagnoses:  Fall, initial encounter  Acute cystitis without hematuria    Rx / DC Orders ED Discharge Orders          Ordered    cephALEXin (KEFLEX) 500 MG capsule  4 times daily        10/10/23 2323              Rexford Maus, DO 10/10/23 2325

## 2023-10-10 NOTE — ED Triage Notes (Signed)
 BIBA from [place] for unwitnessed fall, lac to forehead. No LOC, no thinners, hx of dementia, systolic/diastolic heart failure 86/60 BP 99 HR 18 RR 98% room air

## 2023-10-10 NOTE — ED Notes (Signed)
[facility] transport arranged. 

## 2023-10-11 NOTE — ED Notes (Signed)
 Pt head abrasion cleaned and dry dressing placed on such

## 2023-10-12 DIAGNOSIS — C3492 Malignant neoplasm of unspecified part of left bronchus or lung: Secondary | ICD-10-CM | POA: Diagnosis not present

## 2023-10-12 DIAGNOSIS — I5033 Acute on chronic diastolic (congestive) heart failure: Secondary | ICD-10-CM | POA: Diagnosis not present

## 2023-10-12 DIAGNOSIS — J441 Chronic obstructive pulmonary disease with (acute) exacerbation: Secondary | ICD-10-CM | POA: Diagnosis not present

## 2023-10-12 DIAGNOSIS — C50412 Malignant neoplasm of upper-outer quadrant of left female breast: Secondary | ICD-10-CM | POA: Diagnosis not present

## 2023-10-12 DIAGNOSIS — I701 Atherosclerosis of renal artery: Secondary | ICD-10-CM | POA: Diagnosis not present

## 2023-10-12 DIAGNOSIS — E43 Unspecified severe protein-calorie malnutrition: Secondary | ICD-10-CM | POA: Diagnosis not present

## 2023-10-12 DIAGNOSIS — I619 Nontraumatic intracerebral hemorrhage, unspecified: Secondary | ICD-10-CM | POA: Diagnosis not present

## 2023-10-12 DIAGNOSIS — J449 Chronic obstructive pulmonary disease, unspecified: Secondary | ICD-10-CM | POA: Diagnosis not present

## 2023-10-12 DIAGNOSIS — I69351 Hemiplegia and hemiparesis following cerebral infarction affecting right dominant side: Secondary | ICD-10-CM | POA: Diagnosis not present

## 2023-10-12 DIAGNOSIS — C3491 Malignant neoplasm of unspecified part of right bronchus or lung: Secondary | ICD-10-CM | POA: Diagnosis not present

## 2023-10-12 DIAGNOSIS — I214 Non-ST elevation (NSTEMI) myocardial infarction: Secondary | ICD-10-CM | POA: Diagnosis not present

## 2023-10-12 DIAGNOSIS — I6529 Occlusion and stenosis of unspecified carotid artery: Secondary | ICD-10-CM | POA: Diagnosis not present

## 2023-10-13 ENCOUNTER — Encounter (HOSPITAL_COMMUNITY): Payer: Self-pay

## 2023-10-13 ENCOUNTER — Encounter (HOSPITAL_COMMUNITY)

## 2023-10-13 DIAGNOSIS — I5033 Acute on chronic diastolic (congestive) heart failure: Secondary | ICD-10-CM | POA: Diagnosis not present

## 2023-10-13 DIAGNOSIS — J441 Chronic obstructive pulmonary disease with (acute) exacerbation: Secondary | ICD-10-CM | POA: Diagnosis not present

## 2023-10-13 DIAGNOSIS — I6529 Occlusion and stenosis of unspecified carotid artery: Secondary | ICD-10-CM | POA: Diagnosis not present

## 2023-10-13 DIAGNOSIS — J449 Chronic obstructive pulmonary disease, unspecified: Secondary | ICD-10-CM | POA: Diagnosis not present

## 2023-10-13 DIAGNOSIS — C3491 Malignant neoplasm of unspecified part of right bronchus or lung: Secondary | ICD-10-CM | POA: Diagnosis not present

## 2023-10-13 DIAGNOSIS — E43 Unspecified severe protein-calorie malnutrition: Secondary | ICD-10-CM | POA: Diagnosis not present

## 2023-10-13 DIAGNOSIS — I214 Non-ST elevation (NSTEMI) myocardial infarction: Secondary | ICD-10-CM | POA: Diagnosis not present

## 2023-10-13 DIAGNOSIS — I619 Nontraumatic intracerebral hemorrhage, unspecified: Secondary | ICD-10-CM | POA: Diagnosis not present

## 2023-10-13 DIAGNOSIS — I69351 Hemiplegia and hemiparesis following cerebral infarction affecting right dominant side: Secondary | ICD-10-CM | POA: Diagnosis not present

## 2023-10-13 DIAGNOSIS — C50412 Malignant neoplasm of upper-outer quadrant of left female breast: Secondary | ICD-10-CM | POA: Diagnosis not present

## 2023-10-13 DIAGNOSIS — C3492 Malignant neoplasm of unspecified part of left bronchus or lung: Secondary | ICD-10-CM | POA: Diagnosis not present

## 2023-10-13 DIAGNOSIS — I701 Atherosclerosis of renal artery: Secondary | ICD-10-CM | POA: Diagnosis not present

## 2023-10-14 DIAGNOSIS — C3492 Malignant neoplasm of unspecified part of left bronchus or lung: Secondary | ICD-10-CM | POA: Diagnosis not present

## 2023-10-14 DIAGNOSIS — E43 Unspecified severe protein-calorie malnutrition: Secondary | ICD-10-CM | POA: Diagnosis not present

## 2023-10-14 DIAGNOSIS — C50412 Malignant neoplasm of upper-outer quadrant of left female breast: Secondary | ICD-10-CM | POA: Diagnosis not present

## 2023-10-14 DIAGNOSIS — C3491 Malignant neoplasm of unspecified part of right bronchus or lung: Secondary | ICD-10-CM | POA: Diagnosis not present

## 2023-10-14 LAB — URINE CULTURE

## 2023-10-15 ENCOUNTER — Telehealth (HOSPITAL_BASED_OUTPATIENT_CLINIC_OR_DEPARTMENT_OTHER): Payer: Self-pay | Admitting: *Deleted

## 2023-10-15 DIAGNOSIS — C3491 Malignant neoplasm of unspecified part of right bronchus or lung: Secondary | ICD-10-CM | POA: Diagnosis not present

## 2023-10-15 DIAGNOSIS — J441 Chronic obstructive pulmonary disease with (acute) exacerbation: Secondary | ICD-10-CM | POA: Diagnosis not present

## 2023-10-15 DIAGNOSIS — I619 Nontraumatic intracerebral hemorrhage, unspecified: Secondary | ICD-10-CM | POA: Diagnosis not present

## 2023-10-15 DIAGNOSIS — C50412 Malignant neoplasm of upper-outer quadrant of left female breast: Secondary | ICD-10-CM | POA: Diagnosis not present

## 2023-10-15 DIAGNOSIS — I701 Atherosclerosis of renal artery: Secondary | ICD-10-CM | POA: Diagnosis not present

## 2023-10-15 DIAGNOSIS — I6529 Occlusion and stenosis of unspecified carotid artery: Secondary | ICD-10-CM | POA: Diagnosis not present

## 2023-10-15 DIAGNOSIS — I69351 Hemiplegia and hemiparesis following cerebral infarction affecting right dominant side: Secondary | ICD-10-CM | POA: Diagnosis not present

## 2023-10-15 DIAGNOSIS — C3492 Malignant neoplasm of unspecified part of left bronchus or lung: Secondary | ICD-10-CM | POA: Diagnosis not present

## 2023-10-15 DIAGNOSIS — I5033 Acute on chronic diastolic (congestive) heart failure: Secondary | ICD-10-CM | POA: Diagnosis not present

## 2023-10-15 DIAGNOSIS — J449 Chronic obstructive pulmonary disease, unspecified: Secondary | ICD-10-CM | POA: Diagnosis not present

## 2023-10-15 DIAGNOSIS — E43 Unspecified severe protein-calorie malnutrition: Secondary | ICD-10-CM | POA: Diagnosis not present

## 2023-10-15 DIAGNOSIS — I214 Non-ST elevation (NSTEMI) myocardial infarction: Secondary | ICD-10-CM | POA: Diagnosis not present

## 2023-10-15 NOTE — Telephone Encounter (Signed)
 Post ED Visit - Positive Culture Follow-up: Successful Patient Follow-Up  Culture assessed and recommendations reviewed by:  []  Enzo Bi, Pharm.D. []  Celedonio Miyamoto, Pharm.D., BCPS AQ-ID []  Garvin Fila, Pharm.D., BCPS [x]  Georgina Pillion, [address].D., BCPS []  [place], [address].D., BCPS, AAHIVP []  Estella Husk, Pharm.D., BCPS, AAHIVP []  Lysle Pearl, PharmD, BCPS []  Phillips Climes, PharmD, BCPS []  Agapito Games, PharmD, BCPS []  Verlan Friends, PharmD  Positive urine culture  []  Patient discharged without antimicrobial prescription and treatment is now indicated [x]  Organism is resistant to prescribed ED discharge antimicrobial []  Patient with positive blood cultures  Changes discussed with ED provider: Pfeiffer New antibiotic prescription Fosfomycin 3g po x1 Faxed to [facility] in care of Tiney Rouge RN [address]   Contacted patient, date 10/15/23, time 1300   Bing Quarry 10/15/2023, 1:07 PM

## 2023-10-15 NOTE — Progress Notes (Signed)
 ED Antimicrobial Stewardship Positive Culture Follow Up   Kendra Watts is an 78 y.o. female who presented to [facility] on 10/10/2023 with a chief complaint of  Chief Complaint  Patient presents with   Fall    Recent Results (from the past 720 hours)  Urine Culture     Status: Abnormal   Collection Time: 10/10/23 10:53 PM   Specimen: Urine, Random  Result Value Ref Range Status   Specimen Description   Final    URINE, RANDOM Performed at [facility], [address] [place], [place] [address]    Special Requests   Final    NONE Reflexed from [id number] Performed at [facility], [address], [place], [place] [address]    Culture (A)  Final    >=100,000 COLONIES/mL STAPHYLOCOCCUS HAEMOLYTICUS 80,000 COLONIES/mL ENTEROCOCCUS FAECALIS    Report Status 10/14/2023 FINAL  Final   Organism ID, Bacteria STAPHYLOCOCCUS HAEMOLYTICUS (A)  Final   Organism ID, Bacteria ENTEROCOCCUS FAECALIS (A)  Final      Susceptibility   Enterococcus faecalis - MIC*    AMPICILLIN <=2 SENSITIVE Sensitive     NITROFURANTOIN <=16 SENSITIVE Sensitive     VANCOMYCIN 1 SENSITIVE Sensitive     * 80,000 COLONIES/mL ENTEROCOCCUS FAECALIS   Staphylococcus haemolyticus - MIC*    CIPROFLOXACIN 2 INTERMEDIATE Intermediate     GENTAMICIN <=0.5 SENSITIVE Sensitive     NITROFURANTOIN 32 SENSITIVE Sensitive     OXACILLIN >=4 RESISTANT Resistant     TETRACYCLINE <=1 SENSITIVE Sensitive     VANCOMYCIN <=0.5 SENSITIVE Sensitive     TRIMETH/SULFA <=10 SENSITIVE Sensitive     RIFAMPIN <=0.5 SENSITIVE Sensitive     Inducible Clindamycin POSITIVE Resistant     * >=100,000 COLONIES/mL STAPHYLOCOCCUS HAEMOLYTICUS    [x]  Treated with Keflex, organism resistant to prescribed antimicrobial  77 YOF from SNF for eval after unwitnessed fall. Imaging neg for fracture/bleed. Given underlying dementia patient unable to describe if having urinary symptoms. UA not overly impressive, urine  culture grew E faecalis and staph haemolyticus. After discussion with EDP, opted to treat, will give Fosfomycin which will cover the enterococcus and might provide some staph coverage however unreliable for that.   Call the patient to STOP cephalexin and START Fosfomcyin 3g po x 1 dose  ED Provider: Renne Crigler, PA-C  Thank you for allowing pharmacy to be a part of this patient's care.  Georgina Pillion, PharmD, BCPS, BCIDP Infectious Diseases Clinical Pharmacist 10/15/2023 9:22 AM   **Pharmacist phone directory can now be found on [email or web address] ([email or web address]).  Listed under [facility].

## 2023-10-16 DIAGNOSIS — R059 Cough, unspecified: Secondary | ICD-10-CM | POA: Diagnosis not present

## 2023-10-17 DIAGNOSIS — I214 Non-ST elevation (NSTEMI) myocardial infarction: Secondary | ICD-10-CM | POA: Diagnosis not present

## 2023-10-17 DIAGNOSIS — C3492 Malignant neoplasm of unspecified part of left bronchus or lung: Secondary | ICD-10-CM | POA: Diagnosis not present

## 2023-10-17 DIAGNOSIS — I69351 Hemiplegia and hemiparesis following cerebral infarction affecting right dominant side: Secondary | ICD-10-CM | POA: Diagnosis not present

## 2023-10-17 DIAGNOSIS — I5033 Acute on chronic diastolic (congestive) heart failure: Secondary | ICD-10-CM | POA: Diagnosis not present

## 2023-10-17 DIAGNOSIS — J441 Chronic obstructive pulmonary disease with (acute) exacerbation: Secondary | ICD-10-CM | POA: Diagnosis not present

## 2023-10-17 DIAGNOSIS — C3491 Malignant neoplasm of unspecified part of right bronchus or lung: Secondary | ICD-10-CM | POA: Diagnosis not present

## 2023-10-17 DIAGNOSIS — I701 Atherosclerosis of renal artery: Secondary | ICD-10-CM | POA: Diagnosis not present

## 2023-10-17 DIAGNOSIS — C50412 Malignant neoplasm of upper-outer quadrant of left female breast: Secondary | ICD-10-CM | POA: Diagnosis not present

## 2023-10-17 DIAGNOSIS — E43 Unspecified severe protein-calorie malnutrition: Secondary | ICD-10-CM | POA: Diagnosis not present

## 2023-10-17 DIAGNOSIS — I6529 Occlusion and stenosis of unspecified carotid artery: Secondary | ICD-10-CM | POA: Diagnosis not present

## 2023-10-17 DIAGNOSIS — J449 Chronic obstructive pulmonary disease, unspecified: Secondary | ICD-10-CM | POA: Diagnosis not present

## 2023-10-17 DIAGNOSIS — I619 Nontraumatic intracerebral hemorrhage, unspecified: Secondary | ICD-10-CM | POA: Diagnosis not present

## 2023-10-20 ENCOUNTER — Encounter (HOSPITAL_COMMUNITY)
Admission: RE | Admit: 2023-10-20 | Discharge: 2023-10-20 | Disposition: A | Discharge status: Home / Self Care | Source: Ambulatory Visit | Attending: Emergency Medicine | Admitting: Emergency Medicine

## 2023-10-20 ENCOUNTER — Encounter (HOSPITAL_COMMUNITY): Payer: Self-pay

## 2023-10-20 DIAGNOSIS — F01B2 Vascular dementia, moderate, with psychotic disturbance: Secondary | ICD-10-CM | POA: Diagnosis not present

## 2023-10-20 DIAGNOSIS — I214 Non-ST elevation (NSTEMI) myocardial infarction: Secondary | ICD-10-CM | POA: Diagnosis not present

## 2023-10-20 DIAGNOSIS — F419 Anxiety disorder, unspecified: Secondary | ICD-10-CM | POA: Diagnosis not present

## 2023-10-20 DIAGNOSIS — I619 Nontraumatic intracerebral hemorrhage, unspecified: Secondary | ICD-10-CM | POA: Diagnosis not present

## 2023-10-20 DIAGNOSIS — I6529 Occlusion and stenosis of unspecified carotid artery: Secondary | ICD-10-CM | POA: Diagnosis not present

## 2023-10-20 DIAGNOSIS — J441 Chronic obstructive pulmonary disease with (acute) exacerbation: Secondary | ICD-10-CM | POA: Diagnosis not present

## 2023-10-20 DIAGNOSIS — I69351 Hemiplegia and hemiparesis following cerebral infarction affecting right dominant side: Secondary | ICD-10-CM | POA: Diagnosis not present

## 2023-10-20 DIAGNOSIS — I701 Atherosclerosis of renal artery: Secondary | ICD-10-CM | POA: Diagnosis not present

## 2023-10-20 DIAGNOSIS — J449 Chronic obstructive pulmonary disease, unspecified: Secondary | ICD-10-CM | POA: Diagnosis not present

## 2023-10-20 DIAGNOSIS — R918 Other nonspecific abnormal finding of lung field: Secondary | ICD-10-CM | POA: Insufficient documentation

## 2023-10-20 DIAGNOSIS — C3492 Malignant neoplasm of unspecified part of left bronchus or lung: Secondary | ICD-10-CM | POA: Diagnosis not present

## 2023-10-20 DIAGNOSIS — E43 Unspecified severe protein-calorie malnutrition: Secondary | ICD-10-CM | POA: Diagnosis not present

## 2023-10-20 DIAGNOSIS — C50412 Malignant neoplasm of upper-outer quadrant of left female breast: Secondary | ICD-10-CM | POA: Diagnosis not present

## 2023-10-20 DIAGNOSIS — C3491 Malignant neoplasm of unspecified part of right bronchus or lung: Secondary | ICD-10-CM | POA: Diagnosis not present

## 2023-10-20 DIAGNOSIS — I5033 Acute on chronic diastolic (congestive) heart failure: Secondary | ICD-10-CM | POA: Diagnosis not present

## 2023-10-20 MED ORDER — FLUDEOXYGLUCOSE F - 18 (FDG) INJECTION: 4.3500 | Freq: Once | INTRAVENOUS | Status: DC

## 2023-10-21 DIAGNOSIS — C3492 Malignant neoplasm of unspecified part of left bronchus or lung: Secondary | ICD-10-CM | POA: Diagnosis not present

## 2023-10-21 DIAGNOSIS — I619 Nontraumatic intracerebral hemorrhage, unspecified: Secondary | ICD-10-CM | POA: Diagnosis not present

## 2023-10-21 DIAGNOSIS — E43 Unspecified severe protein-calorie malnutrition: Secondary | ICD-10-CM | POA: Diagnosis not present

## 2023-10-21 DIAGNOSIS — I214 Non-ST elevation (NSTEMI) myocardial infarction: Secondary | ICD-10-CM | POA: Diagnosis not present

## 2023-10-21 DIAGNOSIS — I69351 Hemiplegia and hemiparesis following cerebral infarction affecting right dominant side: Secondary | ICD-10-CM | POA: Diagnosis not present

## 2023-10-21 DIAGNOSIS — I6529 Occlusion and stenosis of unspecified carotid artery: Secondary | ICD-10-CM | POA: Diagnosis not present

## 2023-10-21 DIAGNOSIS — C3491 Malignant neoplasm of unspecified part of right bronchus or lung: Secondary | ICD-10-CM | POA: Diagnosis not present

## 2023-10-21 DIAGNOSIS — C50412 Malignant neoplasm of upper-outer quadrant of left female breast: Secondary | ICD-10-CM | POA: Diagnosis not present

## 2023-10-21 DIAGNOSIS — I701 Atherosclerosis of renal artery: Secondary | ICD-10-CM | POA: Diagnosis not present

## 2023-10-21 DIAGNOSIS — J449 Chronic obstructive pulmonary disease, unspecified: Secondary | ICD-10-CM | POA: Diagnosis not present

## 2023-10-21 DIAGNOSIS — J441 Chronic obstructive pulmonary disease with (acute) exacerbation: Secondary | ICD-10-CM | POA: Diagnosis not present

## 2023-10-21 DIAGNOSIS — I5033 Acute on chronic diastolic (congestive) heart failure: Secondary | ICD-10-CM | POA: Diagnosis not present

## 2023-10-22 DIAGNOSIS — J441 Chronic obstructive pulmonary disease with (acute) exacerbation: Secondary | ICD-10-CM | POA: Diagnosis not present

## 2023-10-22 DIAGNOSIS — I69351 Hemiplegia and hemiparesis following cerebral infarction affecting right dominant side: Secondary | ICD-10-CM | POA: Diagnosis not present

## 2023-10-22 DIAGNOSIS — C50412 Malignant neoplasm of upper-outer quadrant of left female breast: Secondary | ICD-10-CM | POA: Diagnosis not present

## 2023-10-22 DIAGNOSIS — I214 Non-ST elevation (NSTEMI) myocardial infarction: Secondary | ICD-10-CM | POA: Diagnosis not present

## 2023-10-22 DIAGNOSIS — J449 Chronic obstructive pulmonary disease, unspecified: Secondary | ICD-10-CM | POA: Diagnosis not present

## 2023-10-22 DIAGNOSIS — I5033 Acute on chronic diastolic (congestive) heart failure: Secondary | ICD-10-CM | POA: Diagnosis not present

## 2023-10-22 DIAGNOSIS — I619 Nontraumatic intracerebral hemorrhage, unspecified: Secondary | ICD-10-CM | POA: Diagnosis not present

## 2023-10-22 DIAGNOSIS — C3491 Malignant neoplasm of unspecified part of right bronchus or lung: Secondary | ICD-10-CM | POA: Diagnosis not present

## 2023-10-22 DIAGNOSIS — I701 Atherosclerosis of renal artery: Secondary | ICD-10-CM | POA: Diagnosis not present

## 2023-10-22 DIAGNOSIS — E43 Unspecified severe protein-calorie malnutrition: Secondary | ICD-10-CM | POA: Diagnosis not present

## 2023-10-22 DIAGNOSIS — C3492 Malignant neoplasm of unspecified part of left bronchus or lung: Secondary | ICD-10-CM | POA: Diagnosis not present

## 2023-10-22 DIAGNOSIS — I6529 Occlusion and stenosis of unspecified carotid artery: Secondary | ICD-10-CM | POA: Diagnosis not present

## 2023-10-23 DIAGNOSIS — Z515 Encounter for palliative care: Secondary | ICD-10-CM | POA: Diagnosis not present

## 2023-10-23 DIAGNOSIS — D500 Iron deficiency anemia secondary to blood loss (chronic): Secondary | ICD-10-CM | POA: Diagnosis not present

## 2023-10-23 DIAGNOSIS — E43 Unspecified severe protein-calorie malnutrition: Secondary | ICD-10-CM | POA: Diagnosis not present

## 2023-10-23 DIAGNOSIS — J9611 Chronic respiratory failure with hypoxia: Secondary | ICD-10-CM | POA: Diagnosis not present

## 2023-10-24 DIAGNOSIS — C50412 Malignant neoplasm of upper-outer quadrant of left female breast: Secondary | ICD-10-CM | POA: Diagnosis not present

## 2023-10-24 DIAGNOSIS — I619 Nontraumatic intracerebral hemorrhage, unspecified: Secondary | ICD-10-CM | POA: Diagnosis not present

## 2023-10-24 DIAGNOSIS — I5033 Acute on chronic diastolic (congestive) heart failure: Secondary | ICD-10-CM | POA: Diagnosis not present

## 2023-10-24 DIAGNOSIS — I69351 Hemiplegia and hemiparesis following cerebral infarction affecting right dominant side: Secondary | ICD-10-CM | POA: Diagnosis not present

## 2023-10-24 DIAGNOSIS — C3492 Malignant neoplasm of unspecified part of left bronchus or lung: Secondary | ICD-10-CM | POA: Diagnosis not present

## 2023-10-24 DIAGNOSIS — J441 Chronic obstructive pulmonary disease with (acute) exacerbation: Secondary | ICD-10-CM | POA: Diagnosis not present

## 2023-10-24 DIAGNOSIS — I214 Non-ST elevation (NSTEMI) myocardial infarction: Secondary | ICD-10-CM | POA: Diagnosis not present

## 2023-10-24 DIAGNOSIS — I6529 Occlusion and stenosis of unspecified carotid artery: Secondary | ICD-10-CM | POA: Diagnosis not present

## 2023-10-24 DIAGNOSIS — E43 Unspecified severe protein-calorie malnutrition: Secondary | ICD-10-CM | POA: Diagnosis not present

## 2023-10-24 DIAGNOSIS — J449 Chronic obstructive pulmonary disease, unspecified: Secondary | ICD-10-CM | POA: Diagnosis not present

## 2023-10-24 DIAGNOSIS — C3491 Malignant neoplasm of unspecified part of right bronchus or lung: Secondary | ICD-10-CM | POA: Diagnosis not present

## 2023-10-24 DIAGNOSIS — I701 Atherosclerosis of renal artery: Secondary | ICD-10-CM | POA: Diagnosis not present

## 2023-10-28 DIAGNOSIS — I619 Nontraumatic intracerebral hemorrhage, unspecified: Secondary | ICD-10-CM | POA: Diagnosis not present

## 2023-10-28 DIAGNOSIS — E43 Unspecified severe protein-calorie malnutrition: Secondary | ICD-10-CM | POA: Diagnosis not present

## 2023-10-28 DIAGNOSIS — C3491 Malignant neoplasm of unspecified part of right bronchus or lung: Secondary | ICD-10-CM | POA: Diagnosis not present

## 2023-10-28 DIAGNOSIS — I214 Non-ST elevation (NSTEMI) myocardial infarction: Secondary | ICD-10-CM | POA: Diagnosis not present

## 2023-10-28 DIAGNOSIS — I6529 Occlusion and stenosis of unspecified carotid artery: Secondary | ICD-10-CM | POA: Diagnosis not present

## 2023-10-28 DIAGNOSIS — C3492 Malignant neoplasm of unspecified part of left bronchus or lung: Secondary | ICD-10-CM | POA: Diagnosis not present

## 2023-10-28 DIAGNOSIS — I5033 Acute on chronic diastolic (congestive) heart failure: Secondary | ICD-10-CM | POA: Diagnosis not present

## 2023-10-28 DIAGNOSIS — I701 Atherosclerosis of renal artery: Secondary | ICD-10-CM | POA: Diagnosis not present

## 2023-10-28 DIAGNOSIS — C50412 Malignant neoplasm of upper-outer quadrant of left female breast: Secondary | ICD-10-CM | POA: Diagnosis not present

## 2023-10-28 DIAGNOSIS — I69351 Hemiplegia and hemiparesis following cerebral infarction affecting right dominant side: Secondary | ICD-10-CM | POA: Diagnosis not present

## 2023-10-28 DIAGNOSIS — J441 Chronic obstructive pulmonary disease with (acute) exacerbation: Secondary | ICD-10-CM | POA: Diagnosis not present

## 2023-10-28 DIAGNOSIS — J449 Chronic obstructive pulmonary disease, unspecified: Secondary | ICD-10-CM | POA: Diagnosis not present

## 2023-10-29 DIAGNOSIS — C3490 Malignant neoplasm of unspecified part of unspecified bronchus or lung: Secondary | ICD-10-CM | POA: Diagnosis not present

## 2023-10-29 DIAGNOSIS — D649 Anemia, unspecified: Secondary | ICD-10-CM | POA: Diagnosis not present

## 2023-10-29 DIAGNOSIS — R1312 Dysphagia, oropharyngeal phase: Secondary | ICD-10-CM | POA: Diagnosis not present

## 2023-10-29 DIAGNOSIS — F32A Depression, unspecified: Secondary | ICD-10-CM | POA: Diagnosis not present

## 2023-10-29 DIAGNOSIS — E43 Unspecified severe protein-calorie malnutrition: Secondary | ICD-10-CM | POA: Diagnosis not present

## 2023-10-29 DIAGNOSIS — I5032 Chronic diastolic (congestive) heart failure: Secondary | ICD-10-CM | POA: Diagnosis not present

## 2023-10-29 DIAGNOSIS — I10 Essential (primary) hypertension: Secondary | ICD-10-CM | POA: Diagnosis not present

## 2023-10-29 DIAGNOSIS — E039 Hypothyroidism, unspecified: Secondary | ICD-10-CM | POA: Diagnosis not present

## 2023-10-29 DIAGNOSIS — J9611 Chronic respiratory failure with hypoxia: Secondary | ICD-10-CM | POA: Diagnosis not present

## 2023-10-29 DIAGNOSIS — E785 Hyperlipidemia, unspecified: Secondary | ICD-10-CM | POA: Diagnosis not present

## 2023-10-29 DIAGNOSIS — K219 Gastro-esophageal reflux disease without esophagitis: Secondary | ICD-10-CM | POA: Diagnosis not present

## 2023-10-29 DIAGNOSIS — J449 Chronic obstructive pulmonary disease, unspecified: Secondary | ICD-10-CM | POA: Diagnosis not present

## 2023-10-30 ENCOUNTER — Encounter (HOSPITAL_COMMUNITY): Admission: RE | Admit: 2023-10-30 | Source: Ambulatory Visit

## 2023-10-30 DIAGNOSIS — I5032 Chronic diastolic (congestive) heart failure: Secondary | ICD-10-CM | POA: Diagnosis not present

## 2023-10-30 DIAGNOSIS — E119 Type 2 diabetes mellitus without complications: Secondary | ICD-10-CM | POA: Diagnosis not present

## 2023-10-30 DIAGNOSIS — C3491 Malignant neoplasm of unspecified part of right bronchus or lung: Secondary | ICD-10-CM | POA: Diagnosis not present

## 2023-10-30 DIAGNOSIS — C50412 Malignant neoplasm of upper-outer quadrant of left female breast: Secondary | ICD-10-CM | POA: Diagnosis not present

## 2023-10-30 DIAGNOSIS — C3490 Malignant neoplasm of unspecified part of unspecified bronchus or lung: Secondary | ICD-10-CM | POA: Diagnosis not present

## 2023-10-30 DIAGNOSIS — I69351 Hemiplegia and hemiparesis following cerebral infarction affecting right dominant side: Secondary | ICD-10-CM | POA: Diagnosis not present

## 2023-10-30 DIAGNOSIS — I5033 Acute on chronic diastolic (congestive) heart failure: Secondary | ICD-10-CM | POA: Diagnosis not present

## 2023-10-30 DIAGNOSIS — I619 Nontraumatic intracerebral hemorrhage, unspecified: Secondary | ICD-10-CM | POA: Diagnosis not present

## 2023-10-30 DIAGNOSIS — C3492 Malignant neoplasm of unspecified part of left bronchus or lung: Secondary | ICD-10-CM | POA: Diagnosis not present

## 2023-10-30 DIAGNOSIS — J449 Chronic obstructive pulmonary disease, unspecified: Secondary | ICD-10-CM | POA: Diagnosis not present

## 2023-10-30 DIAGNOSIS — E43 Unspecified severe protein-calorie malnutrition: Secondary | ICD-10-CM | POA: Diagnosis not present

## 2023-10-30 DIAGNOSIS — I6529 Occlusion and stenosis of unspecified carotid artery: Secondary | ICD-10-CM | POA: Diagnosis not present

## 2023-10-30 DIAGNOSIS — J441 Chronic obstructive pulmonary disease with (acute) exacerbation: Secondary | ICD-10-CM | POA: Diagnosis not present

## 2023-10-30 DIAGNOSIS — I701 Atherosclerosis of renal artery: Secondary | ICD-10-CM | POA: Diagnosis not present

## 2023-10-30 DIAGNOSIS — I214 Non-ST elevation (NSTEMI) myocardial infarction: Secondary | ICD-10-CM | POA: Diagnosis not present

## 2023-10-30 DIAGNOSIS — I10 Essential (primary) hypertension: Secondary | ICD-10-CM | POA: Diagnosis not present

## 2023-10-30 DIAGNOSIS — E039 Hypothyroidism, unspecified: Secondary | ICD-10-CM | POA: Diagnosis not present

## 2023-10-30 DIAGNOSIS — K219 Gastro-esophageal reflux disease without esophagitis: Secondary | ICD-10-CM | POA: Diagnosis not present

## 2023-11-03 DIAGNOSIS — E43 Unspecified severe protein-calorie malnutrition: Secondary | ICD-10-CM | POA: Diagnosis not present

## 2023-11-03 DIAGNOSIS — C50412 Malignant neoplasm of upper-outer quadrant of left female breast: Secondary | ICD-10-CM | POA: Diagnosis not present

## 2023-11-03 DIAGNOSIS — C3491 Malignant neoplasm of unspecified part of right bronchus or lung: Secondary | ICD-10-CM | POA: Diagnosis not present

## 2023-11-03 DIAGNOSIS — C3492 Malignant neoplasm of unspecified part of left bronchus or lung: Secondary | ICD-10-CM | POA: Diagnosis not present

## 2023-11-04 DIAGNOSIS — I5032 Chronic diastolic (congestive) heart failure: Secondary | ICD-10-CM | POA: Diagnosis not present

## 2023-11-04 DIAGNOSIS — J449 Chronic obstructive pulmonary disease, unspecified: Secondary | ICD-10-CM | POA: Diagnosis not present

## 2023-11-04 DIAGNOSIS — E039 Hypothyroidism, unspecified: Secondary | ICD-10-CM | POA: Diagnosis not present

## 2023-11-04 DIAGNOSIS — I10 Essential (primary) hypertension: Secondary | ICD-10-CM | POA: Diagnosis not present

## 2023-11-04 DIAGNOSIS — K219 Gastro-esophageal reflux disease without esophagitis: Secondary | ICD-10-CM | POA: Diagnosis not present

## 2023-11-04 DIAGNOSIS — C3490 Malignant neoplasm of unspecified part of unspecified bronchus or lung: Secondary | ICD-10-CM | POA: Diagnosis not present

## 2023-11-05 ENCOUNTER — Ambulatory Visit: Admitting: Emergency Medicine

## 2023-11-06 DIAGNOSIS — E43 Unspecified severe protein-calorie malnutrition: Secondary | ICD-10-CM | POA: Diagnosis not present

## 2023-11-06 DIAGNOSIS — I214 Non-ST elevation (NSTEMI) myocardial infarction: Secondary | ICD-10-CM | POA: Diagnosis not present

## 2023-11-06 DIAGNOSIS — F01B2 Vascular dementia, moderate, with psychotic disturbance: Secondary | ICD-10-CM | POA: Diagnosis not present

## 2023-11-06 DIAGNOSIS — J441 Chronic obstructive pulmonary disease with (acute) exacerbation: Secondary | ICD-10-CM | POA: Diagnosis not present

## 2023-11-06 DIAGNOSIS — I69351 Hemiplegia and hemiparesis following cerebral infarction affecting right dominant side: Secondary | ICD-10-CM | POA: Diagnosis not present

## 2023-11-06 DIAGNOSIS — I701 Atherosclerosis of renal artery: Secondary | ICD-10-CM | POA: Diagnosis not present

## 2023-11-06 DIAGNOSIS — C50412 Malignant neoplasm of upper-outer quadrant of left female breast: Secondary | ICD-10-CM | POA: Diagnosis not present

## 2023-11-06 DIAGNOSIS — F064 Anxiety disorder due to known physiological condition: Secondary | ICD-10-CM | POA: Diagnosis not present

## 2023-11-06 DIAGNOSIS — I619 Nontraumatic intracerebral hemorrhage, unspecified: Secondary | ICD-10-CM | POA: Diagnosis not present

## 2023-11-06 DIAGNOSIS — C3492 Malignant neoplasm of unspecified part of left bronchus or lung: Secondary | ICD-10-CM | POA: Diagnosis not present

## 2023-11-06 DIAGNOSIS — F331 Major depressive disorder, recurrent, moderate: Secondary | ICD-10-CM | POA: Diagnosis not present

## 2023-11-06 DIAGNOSIS — F445 Conversion disorder with seizures or convulsions: Secondary | ICD-10-CM | POA: Diagnosis not present

## 2023-11-06 DIAGNOSIS — I6529 Occlusion and stenosis of unspecified carotid artery: Secondary | ICD-10-CM | POA: Diagnosis not present

## 2023-11-06 DIAGNOSIS — J449 Chronic obstructive pulmonary disease, unspecified: Secondary | ICD-10-CM | POA: Diagnosis not present

## 2023-11-06 DIAGNOSIS — C3491 Malignant neoplasm of unspecified part of right bronchus or lung: Secondary | ICD-10-CM | POA: Diagnosis not present

## 2023-11-06 DIAGNOSIS — I5033 Acute on chronic diastolic (congestive) heart failure: Secondary | ICD-10-CM | POA: Diagnosis not present

## 2023-11-07 DIAGNOSIS — E039 Hypothyroidism, unspecified: Secondary | ICD-10-CM | POA: Diagnosis not present

## 2023-11-07 DIAGNOSIS — J449 Chronic obstructive pulmonary disease, unspecified: Secondary | ICD-10-CM | POA: Diagnosis not present

## 2023-11-07 DIAGNOSIS — I5032 Chronic diastolic (congestive) heart failure: Secondary | ICD-10-CM | POA: Diagnosis not present

## 2023-11-07 DIAGNOSIS — K219 Gastro-esophageal reflux disease without esophagitis: Secondary | ICD-10-CM | POA: Diagnosis not present

## 2023-11-07 DIAGNOSIS — I10 Essential (primary) hypertension: Secondary | ICD-10-CM | POA: Diagnosis not present

## 2023-11-07 DIAGNOSIS — C3490 Malignant neoplasm of unspecified part of unspecified bronchus or lung: Secondary | ICD-10-CM | POA: Diagnosis not present

## 2023-11-11 DIAGNOSIS — J449 Chronic obstructive pulmonary disease, unspecified: Secondary | ICD-10-CM | POA: Diagnosis not present

## 2023-11-11 DIAGNOSIS — E43 Unspecified severe protein-calorie malnutrition: Secondary | ICD-10-CM | POA: Diagnosis not present

## 2023-11-11 DIAGNOSIS — I10 Essential (primary) hypertension: Secondary | ICD-10-CM | POA: Diagnosis not present

## 2023-11-11 DIAGNOSIS — C3492 Malignant neoplasm of unspecified part of left bronchus or lung: Secondary | ICD-10-CM | POA: Diagnosis not present

## 2023-11-11 DIAGNOSIS — C3490 Malignant neoplasm of unspecified part of unspecified bronchus or lung: Secondary | ICD-10-CM | POA: Diagnosis not present

## 2023-11-11 DIAGNOSIS — I5032 Chronic diastolic (congestive) heart failure: Secondary | ICD-10-CM | POA: Diagnosis not present

## 2023-11-11 DIAGNOSIS — C3491 Malignant neoplasm of unspecified part of right bronchus or lung: Secondary | ICD-10-CM | POA: Diagnosis not present

## 2023-11-11 DIAGNOSIS — C50412 Malignant neoplasm of upper-outer quadrant of left female breast: Secondary | ICD-10-CM | POA: Diagnosis not present

## 2023-11-11 DIAGNOSIS — K219 Gastro-esophageal reflux disease without esophagitis: Secondary | ICD-10-CM | POA: Diagnosis not present

## 2023-11-11 DIAGNOSIS — E039 Hypothyroidism, unspecified: Secondary | ICD-10-CM | POA: Diagnosis not present

## 2023-11-13 DIAGNOSIS — I5032 Chronic diastolic (congestive) heart failure: Secondary | ICD-10-CM | POA: Diagnosis not present

## 2023-11-13 DIAGNOSIS — I10 Essential (primary) hypertension: Secondary | ICD-10-CM | POA: Diagnosis not present

## 2023-11-13 DIAGNOSIS — K219 Gastro-esophageal reflux disease without esophagitis: Secondary | ICD-10-CM | POA: Diagnosis not present

## 2023-11-13 DIAGNOSIS — J449 Chronic obstructive pulmonary disease, unspecified: Secondary | ICD-10-CM | POA: Diagnosis not present

## 2023-11-13 DIAGNOSIS — C3490 Malignant neoplasm of unspecified part of unspecified bronchus or lung: Secondary | ICD-10-CM | POA: Diagnosis not present

## 2023-11-13 DIAGNOSIS — E039 Hypothyroidism, unspecified: Secondary | ICD-10-CM | POA: Diagnosis not present

## 2023-11-14 DIAGNOSIS — I5033 Acute on chronic diastolic (congestive) heart failure: Secondary | ICD-10-CM | POA: Diagnosis not present

## 2023-11-14 DIAGNOSIS — C3491 Malignant neoplasm of unspecified part of right bronchus or lung: Secondary | ICD-10-CM | POA: Diagnosis not present

## 2023-11-14 DIAGNOSIS — I5032 Chronic diastolic (congestive) heart failure: Secondary | ICD-10-CM | POA: Diagnosis not present

## 2023-11-14 DIAGNOSIS — I10 Essential (primary) hypertension: Secondary | ICD-10-CM | POA: Diagnosis not present

## 2023-11-14 DIAGNOSIS — I701 Atherosclerosis of renal artery: Secondary | ICD-10-CM | POA: Diagnosis not present

## 2023-11-14 DIAGNOSIS — J449 Chronic obstructive pulmonary disease, unspecified: Secondary | ICD-10-CM | POA: Diagnosis not present

## 2023-11-14 DIAGNOSIS — I69351 Hemiplegia and hemiparesis following cerebral infarction affecting right dominant side: Secondary | ICD-10-CM | POA: Diagnosis not present

## 2023-11-14 DIAGNOSIS — I6529 Occlusion and stenosis of unspecified carotid artery: Secondary | ICD-10-CM | POA: Diagnosis not present

## 2023-11-14 DIAGNOSIS — C3492 Malignant neoplasm of unspecified part of left bronchus or lung: Secondary | ICD-10-CM | POA: Diagnosis not present

## 2023-11-14 DIAGNOSIS — C50412 Malignant neoplasm of upper-outer quadrant of left female breast: Secondary | ICD-10-CM | POA: Diagnosis not present

## 2023-11-14 DIAGNOSIS — I619 Nontraumatic intracerebral hemorrhage, unspecified: Secondary | ICD-10-CM | POA: Diagnosis not present

## 2023-11-14 DIAGNOSIS — J441 Chronic obstructive pulmonary disease with (acute) exacerbation: Secondary | ICD-10-CM | POA: Diagnosis not present

## 2023-11-14 DIAGNOSIS — C3490 Malignant neoplasm of unspecified part of unspecified bronchus or lung: Secondary | ICD-10-CM | POA: Diagnosis not present

## 2023-11-14 DIAGNOSIS — E43 Unspecified severe protein-calorie malnutrition: Secondary | ICD-10-CM | POA: Diagnosis not present

## 2023-11-14 DIAGNOSIS — E039 Hypothyroidism, unspecified: Secondary | ICD-10-CM | POA: Diagnosis not present

## 2023-11-14 DIAGNOSIS — I214 Non-ST elevation (NSTEMI) myocardial infarction: Secondary | ICD-10-CM | POA: Diagnosis not present

## 2023-11-14 DIAGNOSIS — K219 Gastro-esophageal reflux disease without esophagitis: Secondary | ICD-10-CM | POA: Diagnosis not present

## 2023-11-18 DIAGNOSIS — E039 Hypothyroidism, unspecified: Secondary | ICD-10-CM | POA: Diagnosis not present

## 2023-11-18 DIAGNOSIS — K219 Gastro-esophageal reflux disease without esophagitis: Secondary | ICD-10-CM | POA: Diagnosis not present

## 2023-11-18 DIAGNOSIS — I10 Essential (primary) hypertension: Secondary | ICD-10-CM | POA: Diagnosis not present

## 2023-11-18 DIAGNOSIS — J449 Chronic obstructive pulmonary disease, unspecified: Secondary | ICD-10-CM | POA: Diagnosis not present

## 2023-11-18 DIAGNOSIS — I5032 Chronic diastolic (congestive) heart failure: Secondary | ICD-10-CM | POA: Diagnosis not present

## 2023-11-18 DIAGNOSIS — C3490 Malignant neoplasm of unspecified part of unspecified bronchus or lung: Secondary | ICD-10-CM | POA: Diagnosis not present

## 2023-11-20 DIAGNOSIS — C3490 Malignant neoplasm of unspecified part of unspecified bronchus or lung: Secondary | ICD-10-CM | POA: Diagnosis not present

## 2023-11-20 DIAGNOSIS — E43 Unspecified severe protein-calorie malnutrition: Secondary | ICD-10-CM | POA: Diagnosis not present

## 2023-11-20 DIAGNOSIS — E039 Hypothyroidism, unspecified: Secondary | ICD-10-CM | POA: Diagnosis not present

## 2023-11-20 DIAGNOSIS — D649 Anemia, unspecified: Secondary | ICD-10-CM | POA: Diagnosis not present

## 2023-11-20 DIAGNOSIS — I10 Essential (primary) hypertension: Secondary | ICD-10-CM | POA: Diagnosis not present

## 2023-11-20 DIAGNOSIS — I5032 Chronic diastolic (congestive) heart failure: Secondary | ICD-10-CM | POA: Diagnosis not present

## 2023-11-20 DIAGNOSIS — F32A Depression, unspecified: Secondary | ICD-10-CM | POA: Diagnosis not present

## 2023-11-20 DIAGNOSIS — E785 Hyperlipidemia, unspecified: Secondary | ICD-10-CM | POA: Diagnosis not present

## 2023-11-20 DIAGNOSIS — J449 Chronic obstructive pulmonary disease, unspecified: Secondary | ICD-10-CM | POA: Diagnosis not present

## 2023-11-20 DIAGNOSIS — R1312 Dysphagia, oropharyngeal phase: Secondary | ICD-10-CM | POA: Diagnosis not present

## 2023-11-20 DIAGNOSIS — J9611 Chronic respiratory failure with hypoxia: Secondary | ICD-10-CM | POA: Diagnosis not present

## 2023-11-20 DIAGNOSIS — K219 Gastro-esophageal reflux disease without esophagitis: Secondary | ICD-10-CM | POA: Diagnosis not present

## 2023-11-21 DIAGNOSIS — I10 Essential (primary) hypertension: Secondary | ICD-10-CM | POA: Diagnosis not present

## 2023-11-21 DIAGNOSIS — J449 Chronic obstructive pulmonary disease, unspecified: Secondary | ICD-10-CM | POA: Diagnosis not present

## 2023-11-21 DIAGNOSIS — I5032 Chronic diastolic (congestive) heart failure: Secondary | ICD-10-CM | POA: Diagnosis not present

## 2023-11-21 DIAGNOSIS — E039 Hypothyroidism, unspecified: Secondary | ICD-10-CM | POA: Diagnosis not present

## 2023-11-21 DIAGNOSIS — C3490 Malignant neoplasm of unspecified part of unspecified bronchus or lung: Secondary | ICD-10-CM | POA: Diagnosis not present

## 2023-11-21 DIAGNOSIS — K219 Gastro-esophageal reflux disease without esophagitis: Secondary | ICD-10-CM | POA: Diagnosis not present

## 2023-11-24 DIAGNOSIS — I214 Non-ST elevation (NSTEMI) myocardial infarction: Secondary | ICD-10-CM | POA: Diagnosis not present

## 2023-11-24 DIAGNOSIS — I6529 Occlusion and stenosis of unspecified carotid artery: Secondary | ICD-10-CM | POA: Diagnosis not present

## 2023-11-24 DIAGNOSIS — E43 Unspecified severe protein-calorie malnutrition: Secondary | ICD-10-CM | POA: Diagnosis not present

## 2023-11-24 DIAGNOSIS — J449 Chronic obstructive pulmonary disease, unspecified: Secondary | ICD-10-CM | POA: Diagnosis not present

## 2023-11-24 DIAGNOSIS — C50412 Malignant neoplasm of upper-outer quadrant of left female breast: Secondary | ICD-10-CM | POA: Diagnosis not present

## 2023-11-24 DIAGNOSIS — I69351 Hemiplegia and hemiparesis following cerebral infarction affecting right dominant side: Secondary | ICD-10-CM | POA: Diagnosis not present

## 2023-11-24 DIAGNOSIS — C3492 Malignant neoplasm of unspecified part of left bronchus or lung: Secondary | ICD-10-CM | POA: Diagnosis not present

## 2023-11-24 DIAGNOSIS — C3491 Malignant neoplasm of unspecified part of right bronchus or lung: Secondary | ICD-10-CM | POA: Diagnosis not present

## 2023-11-24 DIAGNOSIS — I5033 Acute on chronic diastolic (congestive) heart failure: Secondary | ICD-10-CM | POA: Diagnosis not present

## 2023-11-24 DIAGNOSIS — I701 Atherosclerosis of renal artery: Secondary | ICD-10-CM | POA: Diagnosis not present

## 2023-11-24 DIAGNOSIS — J441 Chronic obstructive pulmonary disease with (acute) exacerbation: Secondary | ICD-10-CM | POA: Diagnosis not present

## 2023-11-24 DIAGNOSIS — I619 Nontraumatic intracerebral hemorrhage, unspecified: Secondary | ICD-10-CM | POA: Diagnosis not present

## 2023-11-25 DIAGNOSIS — E039 Hypothyroidism, unspecified: Secondary | ICD-10-CM | POA: Diagnosis not present

## 2023-11-25 DIAGNOSIS — I5032 Chronic diastolic (congestive) heart failure: Secondary | ICD-10-CM | POA: Diagnosis not present

## 2023-11-25 DIAGNOSIS — C3490 Malignant neoplasm of unspecified part of unspecified bronchus or lung: Secondary | ICD-10-CM | POA: Diagnosis not present

## 2023-11-25 DIAGNOSIS — I10 Essential (primary) hypertension: Secondary | ICD-10-CM | POA: Diagnosis not present

## 2023-11-25 DIAGNOSIS — J449 Chronic obstructive pulmonary disease, unspecified: Secondary | ICD-10-CM | POA: Diagnosis not present

## 2023-11-25 DIAGNOSIS — K219 Gastro-esophageal reflux disease without esophagitis: Secondary | ICD-10-CM | POA: Diagnosis not present

## 2023-11-27 DIAGNOSIS — C3490 Malignant neoplasm of unspecified part of unspecified bronchus or lung: Secondary | ICD-10-CM | POA: Diagnosis not present

## 2023-11-27 DIAGNOSIS — E039 Hypothyroidism, unspecified: Secondary | ICD-10-CM | POA: Diagnosis not present

## 2023-11-27 DIAGNOSIS — I10 Essential (primary) hypertension: Secondary | ICD-10-CM | POA: Diagnosis not present

## 2023-11-27 DIAGNOSIS — K219 Gastro-esophageal reflux disease without esophagitis: Secondary | ICD-10-CM | POA: Diagnosis not present

## 2023-11-27 DIAGNOSIS — I5032 Chronic diastolic (congestive) heart failure: Secondary | ICD-10-CM | POA: Diagnosis not present

## 2023-11-27 DIAGNOSIS — J449 Chronic obstructive pulmonary disease, unspecified: Secondary | ICD-10-CM | POA: Diagnosis not present

## 2023-11-29 DIAGNOSIS — I10 Essential (primary) hypertension: Secondary | ICD-10-CM | POA: Diagnosis not present

## 2023-11-29 DIAGNOSIS — J449 Chronic obstructive pulmonary disease, unspecified: Secondary | ICD-10-CM | POA: Diagnosis not present

## 2023-11-29 DIAGNOSIS — C3490 Malignant neoplasm of unspecified part of unspecified bronchus or lung: Secondary | ICD-10-CM | POA: Diagnosis not present

## 2023-11-29 DIAGNOSIS — I5032 Chronic diastolic (congestive) heart failure: Secondary | ICD-10-CM | POA: Diagnosis not present

## 2023-11-29 DIAGNOSIS — E039 Hypothyroidism, unspecified: Secondary | ICD-10-CM | POA: Diagnosis not present

## 2023-11-29 DIAGNOSIS — K219 Gastro-esophageal reflux disease without esophagitis: Secondary | ICD-10-CM | POA: Diagnosis not present

## 2023-12-01 DIAGNOSIS — C3492 Malignant neoplasm of unspecified part of left bronchus or lung: Secondary | ICD-10-CM | POA: Diagnosis not present

## 2023-12-01 DIAGNOSIS — J449 Chronic obstructive pulmonary disease, unspecified: Secondary | ICD-10-CM | POA: Diagnosis not present

## 2023-12-01 DIAGNOSIS — C3491 Malignant neoplasm of unspecified part of right bronchus or lung: Secondary | ICD-10-CM | POA: Diagnosis not present

## 2023-12-01 DIAGNOSIS — I701 Atherosclerosis of renal artery: Secondary | ICD-10-CM | POA: Diagnosis not present

## 2023-12-01 DIAGNOSIS — C50412 Malignant neoplasm of upper-outer quadrant of left female breast: Secondary | ICD-10-CM | POA: Diagnosis not present

## 2023-12-01 DIAGNOSIS — K219 Gastro-esophageal reflux disease without esophagitis: Secondary | ICD-10-CM | POA: Diagnosis not present

## 2023-12-01 DIAGNOSIS — I619 Nontraumatic intracerebral hemorrhage, unspecified: Secondary | ICD-10-CM | POA: Diagnosis not present

## 2023-12-01 DIAGNOSIS — J441 Chronic obstructive pulmonary disease with (acute) exacerbation: Secondary | ICD-10-CM | POA: Diagnosis not present

## 2023-12-01 DIAGNOSIS — E039 Hypothyroidism, unspecified: Secondary | ICD-10-CM | POA: Diagnosis not present

## 2023-12-01 DIAGNOSIS — I5032 Chronic diastolic (congestive) heart failure: Secondary | ICD-10-CM | POA: Diagnosis not present

## 2023-12-01 DIAGNOSIS — C3490 Malignant neoplasm of unspecified part of unspecified bronchus or lung: Secondary | ICD-10-CM | POA: Diagnosis not present

## 2023-12-01 DIAGNOSIS — I6529 Occlusion and stenosis of unspecified carotid artery: Secondary | ICD-10-CM | POA: Diagnosis not present

## 2023-12-01 DIAGNOSIS — I5033 Acute on chronic diastolic (congestive) heart failure: Secondary | ICD-10-CM | POA: Diagnosis not present

## 2023-12-01 DIAGNOSIS — I69351 Hemiplegia and hemiparesis following cerebral infarction affecting right dominant side: Secondary | ICD-10-CM | POA: Diagnosis not present

## 2023-12-01 DIAGNOSIS — E43 Unspecified severe protein-calorie malnutrition: Secondary | ICD-10-CM | POA: Diagnosis not present

## 2023-12-01 DIAGNOSIS — I214 Non-ST elevation (NSTEMI) myocardial infarction: Secondary | ICD-10-CM | POA: Diagnosis not present

## 2023-12-01 DIAGNOSIS — I10 Essential (primary) hypertension: Secondary | ICD-10-CM | POA: Diagnosis not present

## 2023-12-02 DIAGNOSIS — I214 Non-ST elevation (NSTEMI) myocardial infarction: Secondary | ICD-10-CM | POA: Diagnosis not present

## 2023-12-02 DIAGNOSIS — I69351 Hemiplegia and hemiparesis following cerebral infarction affecting right dominant side: Secondary | ICD-10-CM | POA: Diagnosis not present

## 2023-12-02 DIAGNOSIS — K219 Gastro-esophageal reflux disease without esophagitis: Secondary | ICD-10-CM | POA: Diagnosis not present

## 2023-12-02 DIAGNOSIS — I10 Essential (primary) hypertension: Secondary | ICD-10-CM | POA: Diagnosis not present

## 2023-12-02 DIAGNOSIS — C3492 Malignant neoplasm of unspecified part of left bronchus or lung: Secondary | ICD-10-CM | POA: Diagnosis not present

## 2023-12-02 DIAGNOSIS — I701 Atherosclerosis of renal artery: Secondary | ICD-10-CM | POA: Diagnosis not present

## 2023-12-02 DIAGNOSIS — I619 Nontraumatic intracerebral hemorrhage, unspecified: Secondary | ICD-10-CM | POA: Diagnosis not present

## 2023-12-02 DIAGNOSIS — C50412 Malignant neoplasm of upper-outer quadrant of left female breast: Secondary | ICD-10-CM | POA: Diagnosis not present

## 2023-12-02 DIAGNOSIS — J449 Chronic obstructive pulmonary disease, unspecified: Secondary | ICD-10-CM | POA: Diagnosis not present

## 2023-12-02 DIAGNOSIS — I5033 Acute on chronic diastolic (congestive) heart failure: Secondary | ICD-10-CM | POA: Diagnosis not present

## 2023-12-02 DIAGNOSIS — E43 Unspecified severe protein-calorie malnutrition: Secondary | ICD-10-CM | POA: Diagnosis not present

## 2023-12-02 DIAGNOSIS — E039 Hypothyroidism, unspecified: Secondary | ICD-10-CM | POA: Diagnosis not present

## 2023-12-02 DIAGNOSIS — I6529 Occlusion and stenosis of unspecified carotid artery: Secondary | ICD-10-CM | POA: Diagnosis not present

## 2023-12-02 DIAGNOSIS — I5032 Chronic diastolic (congestive) heart failure: Secondary | ICD-10-CM | POA: Diagnosis not present

## 2023-12-02 DIAGNOSIS — C3491 Malignant neoplasm of unspecified part of right bronchus or lung: Secondary | ICD-10-CM | POA: Diagnosis not present

## 2023-12-02 DIAGNOSIS — C3490 Malignant neoplasm of unspecified part of unspecified bronchus or lung: Secondary | ICD-10-CM | POA: Diagnosis not present

## 2023-12-02 DIAGNOSIS — J441 Chronic obstructive pulmonary disease with (acute) exacerbation: Secondary | ICD-10-CM | POA: Diagnosis not present

## 2023-12-05 DIAGNOSIS — C3490 Malignant neoplasm of unspecified part of unspecified bronchus or lung: Secondary | ICD-10-CM | POA: Diagnosis not present

## 2023-12-05 DIAGNOSIS — J449 Chronic obstructive pulmonary disease, unspecified: Secondary | ICD-10-CM | POA: Diagnosis not present

## 2023-12-05 DIAGNOSIS — K219 Gastro-esophageal reflux disease without esophagitis: Secondary | ICD-10-CM | POA: Diagnosis not present

## 2023-12-05 DIAGNOSIS — E039 Hypothyroidism, unspecified: Secondary | ICD-10-CM | POA: Diagnosis not present

## 2023-12-05 DIAGNOSIS — I10 Essential (primary) hypertension: Secondary | ICD-10-CM | POA: Diagnosis not present

## 2023-12-05 DIAGNOSIS — I5032 Chronic diastolic (congestive) heart failure: Secondary | ICD-10-CM | POA: Diagnosis not present

## 2023-12-06 DIAGNOSIS — J449 Chronic obstructive pulmonary disease, unspecified: Secondary | ICD-10-CM | POA: Diagnosis not present

## 2023-12-06 DIAGNOSIS — E039 Hypothyroidism, unspecified: Secondary | ICD-10-CM | POA: Diagnosis not present

## 2023-12-06 DIAGNOSIS — K219 Gastro-esophageal reflux disease without esophagitis: Secondary | ICD-10-CM | POA: Diagnosis not present

## 2023-12-06 DIAGNOSIS — C3490 Malignant neoplasm of unspecified part of unspecified bronchus or lung: Secondary | ICD-10-CM | POA: Diagnosis not present

## 2023-12-06 DIAGNOSIS — I10 Essential (primary) hypertension: Secondary | ICD-10-CM | POA: Diagnosis not present

## 2023-12-06 DIAGNOSIS — I5032 Chronic diastolic (congestive) heart failure: Secondary | ICD-10-CM | POA: Diagnosis not present

## 2023-12-09 DIAGNOSIS — F331 Major depressive disorder, recurrent, moderate: Secondary | ICD-10-CM | POA: Diagnosis not present

## 2023-12-09 DIAGNOSIS — E039 Hypothyroidism, unspecified: Secondary | ICD-10-CM | POA: Diagnosis not present

## 2023-12-09 DIAGNOSIS — F064 Anxiety disorder due to known physiological condition: Secondary | ICD-10-CM | POA: Diagnosis not present

## 2023-12-09 DIAGNOSIS — F445 Conversion disorder with seizures or convulsions: Secondary | ICD-10-CM | POA: Diagnosis not present

## 2023-12-09 DIAGNOSIS — J449 Chronic obstructive pulmonary disease, unspecified: Secondary | ICD-10-CM | POA: Diagnosis not present

## 2023-12-09 DIAGNOSIS — F01B2 Vascular dementia, moderate, with psychotic disturbance: Secondary | ICD-10-CM | POA: Diagnosis not present

## 2023-12-09 DIAGNOSIS — K219 Gastro-esophageal reflux disease without esophagitis: Secondary | ICD-10-CM | POA: Diagnosis not present

## 2023-12-09 DIAGNOSIS — I5032 Chronic diastolic (congestive) heart failure: Secondary | ICD-10-CM | POA: Diagnosis not present

## 2023-12-09 DIAGNOSIS — I10 Essential (primary) hypertension: Secondary | ICD-10-CM | POA: Diagnosis not present

## 2023-12-09 DIAGNOSIS — C3490 Malignant neoplasm of unspecified part of unspecified bronchus or lung: Secondary | ICD-10-CM | POA: Diagnosis not present

## 2023-12-11 DIAGNOSIS — E039 Hypothyroidism, unspecified: Secondary | ICD-10-CM | POA: Diagnosis not present

## 2023-12-11 DIAGNOSIS — J449 Chronic obstructive pulmonary disease, unspecified: Secondary | ICD-10-CM | POA: Diagnosis not present

## 2023-12-11 DIAGNOSIS — I10 Essential (primary) hypertension: Secondary | ICD-10-CM | POA: Diagnosis not present

## 2023-12-11 DIAGNOSIS — K219 Gastro-esophageal reflux disease without esophagitis: Secondary | ICD-10-CM | POA: Diagnosis not present

## 2023-12-11 DIAGNOSIS — C3490 Malignant neoplasm of unspecified part of unspecified bronchus or lung: Secondary | ICD-10-CM | POA: Diagnosis not present

## 2023-12-11 DIAGNOSIS — I5032 Chronic diastolic (congestive) heart failure: Secondary | ICD-10-CM | POA: Diagnosis not present

## 2023-12-12 DIAGNOSIS — J449 Chronic obstructive pulmonary disease, unspecified: Secondary | ICD-10-CM | POA: Diagnosis not present

## 2023-12-12 DIAGNOSIS — C3490 Malignant neoplasm of unspecified part of unspecified bronchus or lung: Secondary | ICD-10-CM | POA: Diagnosis not present

## 2023-12-12 DIAGNOSIS — K219 Gastro-esophageal reflux disease without esophagitis: Secondary | ICD-10-CM | POA: Diagnosis not present

## 2023-12-12 DIAGNOSIS — I5032 Chronic diastolic (congestive) heart failure: Secondary | ICD-10-CM | POA: Diagnosis not present

## 2023-12-12 DIAGNOSIS — E039 Hypothyroidism, unspecified: Secondary | ICD-10-CM | POA: Diagnosis not present

## 2023-12-12 DIAGNOSIS — I10 Essential (primary) hypertension: Secondary | ICD-10-CM | POA: Diagnosis not present

## 2023-12-13 DIAGNOSIS — E039 Hypothyroidism, unspecified: Secondary | ICD-10-CM | POA: Diagnosis not present

## 2023-12-13 DIAGNOSIS — C3490 Malignant neoplasm of unspecified part of unspecified bronchus or lung: Secondary | ICD-10-CM | POA: Diagnosis not present

## 2023-12-13 DIAGNOSIS — J449 Chronic obstructive pulmonary disease, unspecified: Secondary | ICD-10-CM | POA: Diagnosis not present

## 2023-12-13 DIAGNOSIS — K219 Gastro-esophageal reflux disease without esophagitis: Secondary | ICD-10-CM | POA: Diagnosis not present

## 2023-12-13 DIAGNOSIS — I5032 Chronic diastolic (congestive) heart failure: Secondary | ICD-10-CM | POA: Diagnosis not present

## 2023-12-13 DIAGNOSIS — I10 Essential (primary) hypertension: Secondary | ICD-10-CM | POA: Diagnosis not present

## 2023-12-21 DEATH — deceased

## 2024-05-19 ENCOUNTER — Ambulatory Visit: Payer: Medicare Other | Admitting: Family Medicine
